# Patient Record
Sex: Female | Born: 1960
Health system: Southern US, Community
[De-identification: ages and names within clinical notes are randomized; demographics above are authoritative.]

## PROBLEM LIST (undated history)

## (undated) DIAGNOSIS — G894 Chronic pain syndrome: Secondary | ICD-10-CM

## (undated) DIAGNOSIS — I10 Essential (primary) hypertension: Secondary | ICD-10-CM

## (undated) DIAGNOSIS — C801 Malignant (primary) neoplasm, unspecified: Secondary | ICD-10-CM

## (undated) DIAGNOSIS — E785 Hyperlipidemia, unspecified: Secondary | ICD-10-CM

## (undated) DIAGNOSIS — Z923 Personal history of irradiation: Secondary | ICD-10-CM

## (undated) DIAGNOSIS — Z6790 Unspecified blood type, Rh positive: Secondary | ICD-10-CM

## (undated) DIAGNOSIS — C3490 Malignant neoplasm of unspecified part of unspecified bronchus or lung: Secondary | ICD-10-CM

## (undated) DIAGNOSIS — Z5111 Encounter for antineoplastic chemotherapy: Secondary | ICD-10-CM

## (undated) DIAGNOSIS — M810 Age-related osteoporosis without current pathological fracture: Secondary | ICD-10-CM

## (undated) DIAGNOSIS — F329 Major depressive disorder, single episode, unspecified: Secondary | ICD-10-CM

## (undated) DIAGNOSIS — C44319 Basal cell carcinoma of skin of other parts of face: Secondary | ICD-10-CM

## (undated) HISTORY — PX: APPENDECTOMY: SHX54

## (undated) HISTORY — PX: SKIN CANCER EXCISION: SHX779

## (undated) HISTORY — DX: Age-related osteoporosis without current pathological fracture: M81.0

## (undated) HISTORY — DX: Chronic pain syndrome: G89.4

## (undated) HISTORY — DX: Hyperlipidemia, unspecified: E78.5

## (undated) HISTORY — DX: Encounter for antineoplastic chemotherapy: Z51.11

## (undated) HISTORY — DX: Essential (primary) hypertension: I10

## (undated) HISTORY — DX: Unspecified blood type, Rh positive: Z67.90

## (undated) HISTORY — DX: Personal history of irradiation: Z92.3

## (undated) HISTORY — PX: EYE SURGERY: SHX253

## (undated) HISTORY — DX: Major depressive disorder, single episode, unspecified: F32.9

---

## 1979-09-03 HISTORY — PX: ABDOMINAL HYSTERECTOMY: SHX81

## 2013-12-01 ENCOUNTER — Other Ambulatory Visit: Payer: Self-pay | Admitting: Family Medicine

## 2013-12-01 DIAGNOSIS — Z1231 Encounter for screening mammogram for malignant neoplasm of breast: Secondary | ICD-10-CM

## 2013-12-09 ENCOUNTER — Other Ambulatory Visit: Payer: Self-pay | Admitting: Family Medicine

## 2013-12-09 ENCOUNTER — Ambulatory Visit
Admission: RE | Admit: 2013-12-09 | Discharge: 2013-12-09 | Disposition: A | Discharge status: Home / Self Care | Payer: Medicare Other | Source: Ambulatory Visit | Attending: Family Medicine | Admitting: Family Medicine

## 2013-12-09 DIAGNOSIS — Z1231 Encounter for screening mammogram for malignant neoplasm of breast: Secondary | ICD-10-CM

## 2013-12-09 DIAGNOSIS — N61 Mastitis without abscess: Secondary | ICD-10-CM

## 2014-01-18 ENCOUNTER — Ambulatory Visit
Admission: RE | Admit: 2014-01-18 | Discharge: 2014-01-18 | Disposition: A | Discharge status: Home / Self Care | Payer: Medicare Other | Source: Ambulatory Visit | Attending: Family Medicine | Admitting: Family Medicine

## 2014-01-18 DIAGNOSIS — N61 Mastitis without abscess: Secondary | ICD-10-CM

## 2016-04-04 ENCOUNTER — Other Ambulatory Visit: Payer: Self-pay | Admitting: Family Medicine

## 2016-04-04 DIAGNOSIS — J181 Lobar pneumonia, unspecified organism: Secondary | ICD-10-CM

## 2016-04-11 ENCOUNTER — Ambulatory Visit
Admission: RE | Admit: 2016-04-11 | Discharge: 2016-04-11 | Disposition: A | Discharge status: Home / Self Care | Payer: Medicare Other | Source: Ambulatory Visit | Attending: Family Medicine | Admitting: Family Medicine

## 2016-04-11 DIAGNOSIS — J181 Lobar pneumonia, unspecified organism: Secondary | ICD-10-CM

## 2016-04-11 MED ORDER — IOPAMIDOL (ISOVUE-300) INJECTION 61%
75.0000 mL | Freq: Once | INTRAVENOUS | Status: AC | PRN
  Administered 2016-04-11: 75 mL via INTRAVENOUS

## 2016-05-10 ENCOUNTER — Encounter: Payer: Self-pay | Admitting: *Deleted

## 2016-05-10 ENCOUNTER — Encounter: Payer: Self-pay | Admitting: Pulmonary Disease

## 2016-05-13 ENCOUNTER — Encounter: Payer: Self-pay | Admitting: Pulmonary Disease

## 2016-05-13 ENCOUNTER — Ambulatory Visit (INDEPENDENT_AMBULATORY_CARE_PROVIDER_SITE_OTHER): Payer: Medicare Other | Admitting: Pulmonary Disease

## 2016-05-13 VITALS — BP 122/80 | HR 95 | Ht 63.0 in | Wt 125.0 lb

## 2016-05-13 DIAGNOSIS — J9859 Other diseases of mediastinum, not elsewhere classified: Secondary | ICD-10-CM | POA: Diagnosis not present

## 2016-05-13 NOTE — Patient Instructions (Addendum)
It is nice to meet you today. We will schedule you for a PET/ CT Scan We will schedule you for PFT's We will refer you to Interventional Radiology for Biopsy of the mass. This will give [place] diagnosis. Once we have diagnosis we can determine plan of care. Follow up with Dr. Vaughan Browner after PET, PFT's and Biopsy are completed.  Please contact office for sooner follow up if symptoms do not improve or worsen or seek emergency care

## 2016-05-13 NOTE — Progress Notes (Signed)
History of Present Illness Debra Kidd is a 55 y.o. female  Current smoker with a 2 pack per day smoking history x 42 years ( 84 pack year smoking history), with new mediastinal mass here for consultation with Dr. Vaughan Browner..    05/13/2016 Consult for Cough and mediastinal mass: Pt. Presents to the office for consultation for mediastinal mass discovered on CXR and then confirmed with CT Chest Iitial symptoms were cough x 6 months, and weakness.. She continiues to smoke 2 pack per day. She does not want resource information on smoking cessation. She states she has a some weight loss of about 5-6 pounds over the last 3 months.She has not had a good appette. She states she has no difficulty swallowing.She does have shortness of breath with exertion.She states she has had some left sided chest discomfort, which has resolved in the last week. She states she has a productive and non-productive cough. Secretions are clear. She denies any hemoptysis. She thinks she has been told she may have COPD, but is not sure.She does give a history of smoking mariajuana daily to help her relax and sleep. She denies fever, orthopnea or hemoptysis.  Tests CT Chest W/ Contrast 04/11/2016  Elongated right upper lobe pulmonary mass measuring 2.7 x 1.2 x 4.6 cm.  Large soft tissue mass in the right anterior mediastinum measuring 6.2 x 4.2 x 9.1 cm. The mediastinal mass abuts, narrows and potentially invades the superior vena cava, and right upper lobe pulmonary arterial branch, and causes mild narrowing of the trachea.  Two other smaller masses with similar enhancing characteristics are seen in the superior right mediastinum and right supraclavicular locations.  These findings may represent primary lung malignancy with extensive malignant lymphadenopathy. Alternatively this may represent a lymphoproliferative disorder with perilymphatic extension to the right upper lobe of the lung.  Two abnormal  retroperitoneal lymph nodes posterior to the pancreas may be associated with the same process.  Subtle hypoattenuated areas within the spleen may also be related to the primary malignancy or represent areas of hypoperfusion.   Past medical hx Past Medical History:  Diagnosis Date  . Blood type, Rh positive   . Chronic pain syndrome   . Hyperlipidemia   . Hypertension   . Osteoporosis      Current Outpatient Prescriptions on File Prior to Visit  Medication Sig  . dicyclomine (BENTYL) 20 MG tablet Take 20 mg by mouth 3 (three) times daily.  . traMADol (ULTRAM) 50 MG tablet Take by mouth every 6 (six) hours as needed.  Marland Kitchen oxyCODONE-acetaminophen (PERCOCET/ROXICET) 5-325 MG tablet Take 0.5 tablets by mouth every 4 (four) hours as needed for severe pain.   No current facility-administered medications on file prior to visit.      Allergies  Allergen Reactions  . Codeine Nausea And Vomiting  . Motrin [Ibuprofen]     Elevation of BP  . [name]     intolerant    Review Of Systems:  Constitutional:   + weight loss, night sweats,  Fevers, chills, fatigue, or  lassitude.  HEENT:   No headaches,  Difficulty swallowing,  Tooth/dental problems, or  + Sore throat,                No sneezing, itching, ear ache, nasal congestion, post nasal drip,   CV:  No chest pain,  Orthopnea, PND, swelling in lower extremities, anasarca, dizziness, palpitations, syncope.   GI  No heartburn, indigestion, abdominal pain, nausea, vomiting, diarrhea, change in bowel habits, +  loss of appetite, bloody stools.   Resp: + shortness of breath with exertion not  at rest.  + excess mucus, + productive cough,  + non-productive cough,  No coughing up of blood.  No change in color of mucus.  + wheezing with exertion.  No chest wall deformity  Skin: no rash or lesions.  GU: no dysuria, change in color of urine, no urgency or frequency.  No flank pain, no hematuria   MS:  No joint pain or  swelling.  No decreased range of motion.  No back pain.  Psych:  No change in mood or affect. No depression or anxiety.  No memory loss.  Family History  Problem Relation Age of Onset  . Heart disease Mother   . Heart disease Father   . Emphysema Brother   . Breast cancer Maternal Grandmother   . Heart disease Brother     Social History   Social History  . Marital status: Married    Spouse name: N/A  . Number of children: N/A  . Years of education: N/A   Social History Main Topics  . Smoking status: Current Every Day Smoker    Packs/day: 2.00    Years: 42.00  . Smokeless tobacco: Never Used  . Alcohol use No     Comment: quit drinking beer 02/2016  . Drug use:     Frequency: 7.0 times per week    Types: Marijuana  . Sexual activity: Not Asked   Other Topics Concern  . 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 ABDOMINAL HYSTERECTOMY  1981    Vital Signs BP 122/80 (BP Location: Left Arm, Cuff Size: Normal)   Pulse 95   Ht '5\' 3"'$  (1.6 m)   Wt 125 lb (56.7 kg)   SpO2 96%   BMI 22.14 kg/m    Physical Exam:  General- No distress,  A&Ox3, pleasant, thin female wearing dark tinged sunglasses ENT: No sinus tenderness, TM clear, pale nasal mucosa, no oral exudate,no post nasal drip, no LAN Cardiac: S1, S2, regular rate and rhythm, no murmur Chest: No wheeze/ rales/ dullness; no accessory muscle use, no nasal flaring, no sternal retractions Abd.: Soft Non-tender Ext: No clubbing cyanosis, edema Neuro:  normal strength Skin: No rashes, warm and dry Psych: normal mood and behavior   Assessment/Plan  Mediastinal mass Mediastinal Mass: Plan It is nice to meet you today. We will schedule you for a PET/ CT Scan We will schedule you for PFT's ( Pulmonary Function Tests) We will refer  you to Interventional Radiology for Biopsy of the mass found on CT scan.. This will give [place] diagnosis. Follow up with Dr. Vaughan Browner after PET CT and PFT's, and biopsy have been completed... Please contact office for sooner follow up if symptoms do not improve or worsen or seek emergency care      Magdalen Spatz, NP 05/13/2016  3:58 PM   Attending note: I have seen and examined the patient with nurse practitioner/resident and agree with the note. History, labs and imaging reviewed.  55 Y/O active smoker. Referred for evaluation of abnormal CT scan  Images personally reviewed > shows right upper lobe lung mass, rt anterior mediastinal masses, rt supraclavicular adenopathy wit involvement of spleen and abdominal LNs.  Findings are concerning for  a bronchogenic malignancy. The supraclavicular mass appears to be the most accessible. We will schedule for IR biopsy of this lesion. If non diagnostic then we will have to consider bronchoscopy. We will also schedule for PET scan and PFTs.  Marshell Garfinkel MD [facility] Pager [phone] If no answer or after 3pm call: [phone] 05/16/2016, 1:49 PM

## 2016-05-13 NOTE — Assessment & Plan Note (Signed)
Mediastinal Mass: Plan It is nice to meet you today. We will schedule you for a PET/ CT Scan We will schedule you for PFT's ( Pulmonary Function Tests) We will refer you to Interventional Radiology for Biopsy of the mass found on CT scan.. This will give [place] diagnosis. Follow up with Dr. Vaughan Browner after PET CT and PFT's, and biopsy have been completed... Please contact office for sooner follow up if symptoms do not improve or worsen or seek emergency care

## 2016-05-16 ENCOUNTER — Encounter: Payer: Self-pay | Admitting: Pulmonary Disease

## 2016-05-17 ENCOUNTER — Other Ambulatory Visit: Payer: Self-pay | Admitting: Radiology

## 2016-05-20 ENCOUNTER — Ambulatory Visit (HOSPITAL_COMMUNITY): Admission: RE | Admit: 2016-05-20 | Payer: Medicare Other | Source: Ambulatory Visit

## 2016-05-21 ENCOUNTER — Encounter (HOSPITAL_COMMUNITY): Payer: Self-pay | Admitting: *Deleted

## 2016-05-21 ENCOUNTER — Ambulatory Visit (HOSPITAL_COMMUNITY)
Admission: RE | Admit: 2016-05-21 | Discharge: 2016-05-21 | Disposition: A | Discharge status: Home / Self Care | Payer: Medicare Other | Source: Ambulatory Visit | Attending: Pulmonary Disease | Admitting: Pulmonary Disease

## 2016-05-21 DIAGNOSIS — J9859 Other diseases of mediastinum, not elsewhere classified: Secondary | ICD-10-CM

## 2016-05-21 DIAGNOSIS — Z79899 Other long term (current) drug therapy: Secondary | ICD-10-CM | POA: Insufficient documentation

## 2016-05-21 DIAGNOSIS — Z720 Tobacco use: Secondary | ICD-10-CM | POA: Insufficient documentation

## 2016-05-21 DIAGNOSIS — C3491 Malignant neoplasm of unspecified part of right bronchus or lung: Secondary | ICD-10-CM | POA: Insufficient documentation

## 2016-05-21 HISTORY — DX: Malignant (primary) neoplasm, unspecified: C80.1

## 2016-05-21 HISTORY — DX: Basal cell carcinoma of skin of other parts of face: C44.319

## 2016-05-21 LAB — CBC WITH DIFFERENTIAL/PLATELET
BASOS ABS: 0 10*3/uL (ref 0.0–0.1)
BASOS PCT: 0 %
EOS PCT: 1 %
Eosinophils Absolute: 0.1 10*3/uL (ref 0.0–0.7)
HEMATOCRIT: 45.4 % (ref 36.0–46.0)
Hemoglobin: 15.5 g/dL — ABNORMAL HIGH (ref 12.0–15.0)
LYMPHS PCT: 18 %
Lymphs Abs: 1.8 10*3/uL (ref 0.7–4.0)
MCH: 35.6 pg — ABNORMAL HIGH (ref 26.0–34.0)
MCHC: 34.1 g/dL (ref 30.0–36.0)
MCV: 104.4 fL — AB (ref 78.0–100.0)
MONO ABS: 0.6 10*3/uL (ref 0.1–1.0)
MONOS PCT: 6 %
NEUTROS ABS: 7.4 10*3/uL (ref 1.7–7.7)
Neutrophils Relative %: 75 %
PLATELETS: 485 10*3/uL — AB (ref 150–400)
RBC: 4.35 MIL/uL (ref 3.87–5.11)
RDW: 13.6 % (ref 11.5–15.5)
WBC: 9.9 10*3/uL (ref 4.0–10.5)

## 2016-05-21 LAB — BASIC METABOLIC PANEL
ANION GAP: 13 (ref 5–15)
BUN: 7 mg/dL (ref 6–20)
CALCIUM: 9.3 mg/dL (ref 8.9–10.3)
CO2: 21 mmol/L — AB (ref 22–32)
Chloride: 101 mmol/L (ref 101–111)
Creatinine, Ser: 0.76 mg/dL (ref 0.44–1.00)
GFR calc Af Amer: >60 mL/min (ref >60)
GLUCOSE: 117 mg/dL — AB (ref 65–99)
Potassium: 3.1 mmol/L — ABNORMAL LOW (ref 3.5–5.1)
Sodium: 135 mmol/L (ref 135–145)

## 2016-05-21 LAB — PROTIME-INR
INR: 1
Prothrombin Time: 13.2 seconds (ref 11.4–15.2)

## 2016-05-21 MED ORDER — MIDAZOLAM HCL 2 MG/2ML IJ SOLN
INTRAMUSCULAR | Status: AC
  Filled 2016-05-21: qty 4

## 2016-05-21 MED ORDER — FENTANYL CITRATE (PF) 100 MCG/2ML IJ SOLN
INTRAMUSCULAR | Status: AC | PRN
  Administered 2016-05-21: 50 ug via INTRAVENOUS
  Administered 2016-05-21: 25 ug via INTRAVENOUS

## 2016-05-21 MED ORDER — SODIUM CHLORIDE 0.9 % IV SOLN
INTRAVENOUS | Status: DC
  Administered 2016-05-21: 11:00:00 via INTRAVENOUS

## 2016-05-21 MED ORDER — MIDAZOLAM HCL 2 MG/2ML IJ SOLN
INTRAMUSCULAR | Status: AC | PRN
  Administered 2016-05-21 (×2): 1 mg via INTRAVENOUS

## 2016-05-21 MED ORDER — HYDROCODONE-ACETAMINOPHEN 5-325 MG PO TABS: 1.0000 | ORAL_TABLET | ORAL | Status: DC | PRN

## 2016-05-21 MED ORDER — FENTANYL CITRATE (PF) 100 MCG/2ML IJ SOLN
INTRAMUSCULAR | Status: AC
  Filled 2016-05-21: qty 2

## 2016-05-21 NOTE — Consult Note (Signed)
Chief Complaint: Patient was seen in consultation today for US guided right supraclavicular lymph node biopsy  Referring Physician(s): [place]  Supervising Physician: Arne Cleveland  Patient Status: Outpatient  History of Present Illness: Debra Kidd is a 55 y.o. female smoker with recent history of right chest discomfort, cough and imaging studies now revealing elongated right upper lobe lung mass, right anterior mediastinal soft tissue mass, right supraclavicular adenopathy, retroperitoneal lymphadenopathy, as well as subtle hypoattenuated areas within the spleen. She presents today for ultrasound-guided right supraclavicular lymph node biopsy for further evaluation.  Past Medical History:  Diagnosis Date  . 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 Blood type, Rh positive   . Cancer ([place])   . Chronic pain syndrome   . Hyperlipidemia   . Hypertension   . Osteoporosis     Past Surgical History:  Procedure Laterality Date  . ABDOMINAL HYSTERECTOMY  1981  . EYE SURGERY     left  . SKIN CANCER EXCISION     basal cell carcinoma L side of face    Allergies: Codeine; Motrin [ibuprofen]; and [name]  Medications: Prior to Admission medications   Medication Sig Start Date End Date Taking? Authorizing Provider  LORazepam (ATIVAN) 1 MG tablet Take 1 mg by mouth 2 (two) times daily.   Yes Historical Provider, MD  losartan (COZAAR) 50 MG tablet Take 50 mg by mouth daily.   Yes Historical Provider, MD  traMADol (ULTRAM) 50 MG tablet Take by mouth every 6 (six) hours as needed.   Yes Historical Provider, MD  dicyclomine (BENTYL) 20 MG tablet Take 20 mg by mouth 3 (three) times daily.    Historical Provider, MD  oxyCODONE-acetaminophen (PERCOCET/ROXICET) 5-325 MG tablet Take 0.5 tablets by mouth every 4 (four) hours as needed for severe pain.    Historical Provider, MD     Family History  Problem Relation Age of Onset  . Heart disease Mother    . Heart disease Father   . Emphysema Brother   . Breast cancer Maternal Grandmother   . Heart disease Brother     Social History   Social History  . Marital status: Married    Spouse name: N/A  . Number of children: N/A  . Years of education: N/A   Social History Main Topics  . Smoking status: Current Every Day Smoker    Packs/day: 2.00    Years: 42.00  . Smokeless tobacco: Never Used  . Alcohol use No     Comment: quit drinking beer 02/2016  . Drug use:     Frequency: 7.0 times per week    Types: Marijuana  . Sexual activity: Not Asked   Other Topics Concern  . 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 She has had some mild weight loss, right chest discomfort, occasional dyspnea, cough.  Vital Signs: BP (!) 157/99 (BP Location: Right Arm)   Pulse (!) 108   Temp 98.8 F (37.1 C) (Oral)   Resp 16   SpO2 98%   Physical Exam awake, alert. Chest with few fine bibasilar crackles. Heart with  slightly tachycardic but regular rhythm. Abdomen soft, positive bowel sounds, nontender. Lower extremities with no edema.  Mallampati Score:     Imaging: No results found.  Labs:  CBC: No results for input(s): WBC, HGB, HCT, PLT in the last 8760 hours.  COAGS: No results for input(s): INR, APTT in the last 8760 hours.  BMP: No results for input(s): NA, K, CL, CO2, GLUCOSE, BUN, CALCIUM, CREATININE, GFRNONAA, GFRAA in the last 8760 hours.  Invalid input(s): CMP  LIVER FUNCTION TESTS: No results for input(s): BILITOT, AST, ALT, ALKPHOS, PROT, ALBUMIN in the last 8760 hours.  TUMOR MARKERS: No results for input(s): AFPTM, CEA, CA199, CHROMGRNA in the last 8760 hours.  Assessment and Plan: 55 y.o. female smoker with recent  history of right chest discomfort, cough and imaging studies now revealing elongated right upper lobe lung mass, right anterior mediastinal soft tissue mass, right supraclavicular adenopathy, retroperitoneal lymphadenopathy, as well as subtle hypoattenuated areas within the spleen. She presents today for ultrasound-guided right supraclavicular lymph node biopsy for further evaluation.Risks and benefits discussed with the patient/husbandincluding, but not limited to bleeding, infection, damage to adjacent structures or low yield requiring additional tests. All of the patient's questions were answered, patient is agreeable to proceed. Consent signed and in chart.      Thank you for this interesting consult.  I greatly enjoyed meeting Ertha Nabor and look forward to participating in their care.  A copy of this report was sent to the requesting provider on this date.  Electronically Signed: D. Rowe Robert 05/21/2016, 12:02 PM   I spent a total of 25 minutes  in face to face in clinical consultation, greater than 50% of which was counseling/coordinating care for US guided right supraclavicular lymph node biopsy

## 2016-05-21 NOTE — CV Procedure (Signed)
[place] core biopsy R supraclavicular LAN No complication No blood loss. See complete dictation in [facility].

## 2016-05-21 NOTE — Discharge Instructions (Signed)
Incision Care  An incision (cut) is when a surgeon cuts into your body. After surgery, the cut needs to be well cared for to keep it from getting infected.  HOW TO CARE FOR YOUR CUT  Take medicines only as told by your doctor.  There are many different ways to close and cover a cut, including stitches, skin glue, and adhesive strips. Follow your doctor's instructions on:  Care of the cut.  Bandage (dressing) changes and removal.  Cut closure removal.  Do not take baths, swim, or use a hot tub until your doctor says it is okay. You may shower as told by your doctor.  Return to your normal diet and activities as allowed by your doctor.  Use medicine that helps lessen itching on your cut as told by your doctor. Do not pick or scratch at your cut.  Drink enough fluids to keep your pee (urine) clear or pale yellow. GET HELP IF:  You have redness, puffiness (swelling), or pain at the site of your cut.  You have fluid, blood, or pus coming from your cut.  Your muscles ache.  You have chills or you feel sick.  You have a bad smell coming from the cut or bandage.  Your cut opens up after stitches, staples, or adhesive strips have been removed.  You keep feeling sick to your stomach (nauseous) or keep throwing up (vomiting).  You have a fever.  You are dizzy. GET HELP RIGHT AWAY IF:  You have a rash.  You pass out (faint).  You have trouble breathing. MAKE SURE YOU:   Understand these instructions.  Will watch your condition.  Will get help right away if you are not doing well or get worse.   This information is not intended to replace advice given to you by your health care provider. Make sure you discuss any questions you have with your health care provider.   Document Released: 11/11/2011 Document Revised: 09/09/2014 Document Reviewed: 10/13/2013 Elsevier Interactive Patient Education 2016 Elsevier Inc. Moderate Conscious Sedation, Adult, Care After Refer to this  sheet in the next few weeks. These instructions provide you with information on caring for yourself after your procedure. Your health care provider may also give you more specific instructions. Your treatment has been planned according to current medical practices, but problems sometimes occur. Call your health care provider if you have any problems or questions after your procedure. WHAT TO EXPECT AFTER THE PROCEDURE  After your procedure:  You may feel sleepy, clumsy, and have poor balance for several hours.  Vomiting may occur if you eat too soon after the procedure. HOME CARE INSTRUCTIONS  Do not participate in any activities where you could become injured for at least 24 hours. Do not:  Drive.  Swim.  Ride a bicycle.  Operate heavy machinery.  Cook.  Use power tools.  Climb ladders.  Work from a high place.  Do not make important decisions or sign legal documents until you are improved.  If you vomit, drink water, juice, or soup when you can drink without vomiting. Make sure you have little or no nausea before eating solid foods.  Only take over-the-counter or prescription medicines for pain, discomfort, or fever as directed by your health care provider.  Make sure you and your family fully understand everything about the medicines given to you, including what side effects may occur.  You should not drink alcohol, take sleeping pills, or take medicines that cause drowsiness for at least 24  hours.  If you smoke, do not smoke without supervision.  If you are feeling better, you may resume normal activities 24 hours after you were sedated.  Keep all appointments with your health care provider. SEEK MEDICAL CARE IF:  Your skin is pale or bluish in color.  You continue to feel nauseous or vomit.  Your pain is getting worse and is not helped by medicine.  You have bleeding or swelling.  You are still sleepy or feeling clumsy after 24 hours. SEEK IMMEDIATE MEDICAL  CARE IF:  You develop a rash.  You have difficulty breathing.  You develop any type of allergic problem.  You have a fever. MAKE SURE YOU:  Understand these instructions.  Will watch your condition.  Will get help right away if you are not doing well or get worse.   This information is not intended to replace advice given to you by your health care provider. Make sure you discuss any questions you have with your health care provider.   Document Released: 06/09/2013 Document Revised: 09/09/2014 Document Reviewed: 06/09/2013 Elsevier Interactive Patient Education [employer].

## 2016-05-22 LAB — ACID FAST SMEAR (AFB): ACID FAST SMEAR - AFSCU2: NEGATIVE

## 2016-05-22 LAB — ACID FAST SMEAR (AFB, MYCOBACTERIA)

## 2016-05-23 ENCOUNTER — Telehealth: Payer: Self-pay | Admitting: Pulmonary Disease

## 2016-05-23 DIAGNOSIS — C801 Malignant (primary) neoplasm, unspecified: Secondary | ICD-10-CM

## 2016-05-23 NOTE — Telephone Encounter (Signed)
Correct # is [phone]. Called # and was placed on hold x 8 min. WCB

## 2016-05-23 NOTE — Telephone Encounter (Signed)
Per PM: refer to Oncology ASAP for new diagnosis of small cell carcinoma.  Thanks.  Order placed Nothing further needed; will sign off

## 2016-05-23 NOTE — Telephone Encounter (Signed)
I spoke on the phone with pathology. 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 I spoke and informed the patient about the diagnosis. Referral made to Oncology.   Marshell Garfinkel MD [facility] Pager [phone] 05/23/2016, 1:20 PM

## 2016-05-24 ENCOUNTER — Telehealth: Payer: Self-pay | Admitting: *Deleted

## 2016-05-24 ENCOUNTER — Encounter: Payer: Self-pay | Admitting: *Deleted

## 2016-05-24 ENCOUNTER — Other Ambulatory Visit: Payer: Medicare Other

## 2016-05-24 DIAGNOSIS — J9859 Other diseases of mediastinum, not elsewhere classified: Secondary | ICD-10-CM

## 2016-05-24 NOTE — Telephone Encounter (Signed)
Mailed clinic letter to pt.  

## 2016-05-24 NOTE — Telephone Encounter (Signed)
Oncology Nurse Navigator Documentation  Oncology Nurse Navigator Flowsheets 05/24/2016  Navigator Encounter Type Introductory phone call;Telephone/I received referral on Debra Kidd.  I called but was unable to reach or leave a vm message.   Telephone Outgoing Call  Treatment Phase Pre-Tx/Tx Discussion  Barriers/Navigation Needs Coordination of Care  Interventions Coordination of Care  Coordination of Care Appts  Acuity Level 1  Time Spent with Patient 15

## 2016-05-24 NOTE — Telephone Encounter (Signed)
Oncology Nurse Navigator Documentation  Oncology Nurse Navigator Flowsheets 05/24/2016  Navigator Encounter Type Telephone/I made another call to schedule [name] for [employer].  I was able to reach her and gave her the appt for 05/30/16 arrive at 1:30.  She verbalized understanding of appt time and place.   Telephone Outgoing Call  Treatment Phase Pre-Tx/Tx Discussion  Barriers/Navigation Needs Coordination of Care  Interventions Coordination of Care  Coordination of Care Appts  Acuity Level 1  Acuity Level 1 Initial guidance, education and coordination as needed  Time Spent with Patient 15

## 2016-05-26 LAB — AEROBIC/ANAEROBIC CULTURE (SURGICAL/DEEP WOUND): CULTURE: NO GROWTH

## 2016-05-26 LAB — AEROBIC/ANAEROBIC CULTURE W GRAM STAIN (SURGICAL/DEEP WOUND)
Gram Stain: NONE SEEN
Special Requests: NORMAL

## 2016-05-30 ENCOUNTER — Encounter: Payer: Self-pay | Admitting: *Deleted

## 2016-05-30 ENCOUNTER — Ambulatory Visit: Payer: Medicare Other | Attending: Internal Medicine | Admitting: Physical Therapy

## 2016-05-30 ENCOUNTER — Other Ambulatory Visit (HOSPITAL_BASED_OUTPATIENT_CLINIC_OR_DEPARTMENT_OTHER): Payer: Medicare Other

## 2016-05-30 ENCOUNTER — Ambulatory Visit: Payer: Medicare Other

## 2016-05-30 ENCOUNTER — Ambulatory Visit (HOSPITAL_BASED_OUTPATIENT_CLINIC_OR_DEPARTMENT_OTHER): Payer: Medicare Other | Admitting: Internal Medicine

## 2016-05-30 ENCOUNTER — Ambulatory Visit
Admission: RE | Admit: 2016-05-30 | Discharge: 2016-05-30 | Disposition: A | Discharge status: Home / Self Care | Payer: Medicare Other | Source: Ambulatory Visit | Attending: Radiation Oncology | Admitting: Radiation Oncology

## 2016-05-30 ENCOUNTER — Encounter: Payer: Self-pay | Admitting: Internal Medicine

## 2016-05-30 VITALS — BP 120/90 | HR 102 | Temp 99.2°F | Resp 17 | Ht 63.0 in | Wt 122.8 lb

## 2016-05-30 DIAGNOSIS — M810 Age-related osteoporosis without current pathological fracture: Secondary | ICD-10-CM

## 2016-05-30 DIAGNOSIS — E876 Hypokalemia: Secondary | ICD-10-CM

## 2016-05-30 DIAGNOSIS — C3411 Malignant neoplasm of upper lobe, right bronchus or lung: Secondary | ICD-10-CM | POA: Diagnosis not present

## 2016-05-30 DIAGNOSIS — R2681 Unsteadiness on feet: Secondary | ICD-10-CM

## 2016-05-30 DIAGNOSIS — R599 Enlarged lymph nodes, unspecified: Secondary | ICD-10-CM

## 2016-05-30 DIAGNOSIS — J9859 Other diseases of mediastinum, not elsewhere classified: Secondary | ICD-10-CM

## 2016-05-30 DIAGNOSIS — M899 Disorder of bone, unspecified: Secondary | ICD-10-CM | POA: Diagnosis not present

## 2016-05-30 DIAGNOSIS — C3491 Malignant neoplasm of unspecified part of right bronchus or lung: Secondary | ICD-10-CM | POA: Insufficient documentation

## 2016-05-30 DIAGNOSIS — K869 Disease of pancreas, unspecified: Secondary | ICD-10-CM

## 2016-05-30 DIAGNOSIS — Z23 Encounter for immunization: Secondary | ICD-10-CM | POA: Diagnosis not present

## 2016-05-30 DIAGNOSIS — Z803 Family history of malignant neoplasm of breast: Secondary | ICD-10-CM | POA: Diagnosis not present

## 2016-05-30 DIAGNOSIS — R29898 Other symptoms and signs involving the musculoskeletal system: Secondary | ICD-10-CM

## 2016-05-30 DIAGNOSIS — Z8249 Family history of ischemic heart disease and other diseases of the circulatory system: Secondary | ICD-10-CM

## 2016-05-30 DIAGNOSIS — Z720 Tobacco use: Secondary | ICD-10-CM | POA: Diagnosis not present

## 2016-05-30 LAB — COMPREHENSIVE METABOLIC PANEL
ALBUMIN: 3 g/dL — AB (ref 3.5–5.0)
ALT: 19 U/L (ref 0–55)
AST: 15 U/L (ref 5–34)
Alkaline Phosphatase: 132 U/L (ref 40–150)
Anion Gap: 12 mEq/L — ABNORMAL HIGH (ref 3–11)
BUN: 4.3 mg/dL — AB (ref 7.0–26.0)
CHLORIDE: 104 meq/L (ref 98–109)
CO2: 22 mEq/L (ref 22–29)
CREATININE: 0.7 mg/dL (ref 0.6–1.1)
Calcium: 9.1 mg/dL (ref 8.4–10.4)
EGFR: >90 mL/min/{1.73_m2} (ref >90)
GLUCOSE: 112 mg/dL (ref 70–140)
POTASSIUM: 2.9 meq/L — AB (ref 3.5–5.1)
SODIUM: 138 meq/L (ref 136–145)
Total Bilirubin: 0.33 mg/dL (ref 0.20–1.20)
Total Protein: 6.8 g/dL (ref 6.4–8.3)

## 2016-05-30 LAB — CBC WITH DIFFERENTIAL/PLATELET
BASO%: 0.2 % (ref 0.0–2.0)
BASOS ABS: 0 10*3/uL (ref 0.0–0.1)
EOS%: 1 % (ref 0.0–7.0)
Eosinophils Absolute: 0.1 10*3/uL (ref 0.0–0.5)
HEMATOCRIT: 39.6 % (ref 34.8–46.6)
HEMOGLOBIN: 13.6 g/dL (ref 11.6–15.9)
LYMPH#: 1.6 10*3/uL (ref 0.9–3.3)
LYMPH%: 17.7 % (ref 14.0–49.7)
MCH: 35 pg — AB (ref 25.1–34.0)
MCHC: 34.3 g/dL (ref 31.5–36.0)
MCV: 101.8 fL — ABNORMAL HIGH (ref 79.5–101.0)
MONO#: 0.7 10*3/uL (ref 0.1–0.9)
MONO%: 7.2 % (ref 0.0–14.0)
NEUT#: 6.9 10*3/uL — ABNORMAL HIGH (ref 1.5–6.5)
NEUT%: 73.9 % (ref 38.4–76.8)
Platelets: 402 10*3/uL — ABNORMAL HIGH (ref 145–400)
RBC: 3.89 10*6/uL (ref 3.70–5.45)
RDW: 13.4 % (ref 11.2–14.5)
WBC: 9.3 10*3/uL (ref 3.9–10.3)

## 2016-05-30 MED ORDER — LIDOCAINE-PRILOCAINE 2.5-2.5 % EX CREA: 1.0000 "application " | TOPICAL_CREAM | CUTANEOUS | 0 refills | Status: DC | PRN

## 2016-05-30 MED ORDER — PROCHLORPERAZINE MALEATE 10 MG PO TABS: 10.0000 mg | ORAL_TABLET | Freq: Four times a day (QID) | ORAL | 0 refills | Status: DC | PRN

## 2016-05-30 MED ORDER — POTASSIUM CHLORIDE ER 20 MEQ PO TBCR: 40.0000 meq | EXTENDED_RELEASE_TABLET | Freq: Every day | ORAL | 0 refills | Status: DC

## 2016-05-30 MED ORDER — INFLUENZA VAC SPLIT QUAD 0.5 ML IM SUSY
0.5000 mL | PREFILLED_SYRINGE | Freq: Once | INTRAMUSCULAR | Status: AC
  Administered 2016-05-30: 0.5 mL via INTRAMUSCULAR
  Filled 2016-05-30: qty 0.5

## 2016-05-30 NOTE — Patient Instructions (Signed)

## 2016-05-30 NOTE — Progress Notes (Signed)
[facility] [phone]   [phone] Multidisciplinary thoracic oncology clinic   CONSULT NOTE  REFERRING PHYSICIAN: Dr. Marshell Garfinkel  REASON FOR CONSULTATION:  55 years old white female recently diagnosed with lung cancer.  HPI Debra Kidd is a 55 y.o. female was past medical history significant for hypertension, dyslipidemia, osteoporosis, chronic pain syndrome as well as basal cell carcinoma of the nose. In late July 2017 The patient has been complaining of discomfort on the right side of the chest as well as cough and questionable seasonal allergy. She was seen by her primary care physician and chest x-ray on 03/31/2016 showed right upper lobe opacity with abnormal can 2 of the right superior mediastinum. This was followed by CT scan of the chest on 04/11/2016 and it showed a lobulated soft tissue mass within the superior mediastinum centered in the right pretracheal location measuring 6.2 x 4.2 x 9.1 cm. It abuts the right superior pericardium and causes significant not willing of the right upper lobe pulmonary artery which crosses through the inferior portion of this mass. The mass abuts, now was and potentially invade the superior vena cava. There was also mild narrowing of the trachea. Two other similar in enhancement characteristics soft tissue masses are seen in the right supraclavicular location measuring 3.1 by 2.3 by 2.5 cm and 1.8 x 1.2 x 1.3 cm. Subcarinal lymph node with nonspecific appearance is seen. There is an irregular soft tissue mass in the right upper lobe which measures 2.7 by 1.2 by 4.6 cm and bridges the apical pleura with the medial mediastinum. Additionally, there is a soft tissue mass within the distal body of the pancreas which measures 3.1 x 2.4 x 2.7 cm. There is an associated minimal dilation of the main pancreatic duct in the tail of the pancreas. Given the findings of lymphadenopathy in the mediastinum, retroperitoneum and  right supraclavicular regions, and probable splenic metastases, the pancreatic mass may represent the primary malignancy. The patient was referred to Dr. Vaughan Browner. He ordered ultrasound guided core biopsy of the right supraclavicular lymph node which was performed by interventional radiology on 05/21/2016 and the final pathology (Accession: [id number]) was positive for small cell carcinoma. The tumor is positive for cytokeratin AE1/AE3, TTF-1, synaptophysin, CD56 and chromogranin. The tumor is negative for CD45, CDX-2 and cytokeratin 5/6. The morphology coupled with the staining pattern is consistent with small cell carcinoma and the immunohistochemical staining pattern is consistent with a lung primary source. Dr. Vaughan Browner kindly referred the patient to me today for evaluation and recommendation regarding treatment of her condition. When seen today the patient is complaining of swelling of the right neck as well as shortness of breath with cough productive of clear sputum. She also has mild vomiting after excessive cough. She lost around 10 pounds in the last 2 months. She denied having any headache or visual changes. She has no nausea, diarrhea or constipation. Family history significant for father, mother and brother died from heart disease. Maternal grandmother had breast cancer. The patient is married and has 2 children. She was accompanied today by her husband, Debra Kidd. She is currently retired and worked in several jobs including cooking. She has a history of smoking 2 packs per day for around 43 years and unfortunately she continues to smoke. She has no history of alcohol abuse and she smokes marijuana.  HPI  Past Medical History:  Diagnosis Date  . 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  Blood type, Rh positive   . Cancer ([place])   . Chronic pain syndrome   . Hyperlipidemia   . Hypertension   . Osteoporosis     Past Surgical History:  Procedure Laterality Date  . ABDOMINAL  HYSTERECTOMY  1981  . EYE SURGERY     left  . SKIN CANCER EXCISION     basal cell carcinoma L side of face    Family History  Problem Relation Age of Onset  . Heart disease Mother   . Heart disease Father   . Emphysema Brother   . Breast cancer Maternal Grandmother   . Heart disease Brother     Social History Social History  Substance Use Topics  . Smoking status: Current Every Day Smoker    Packs/day: 2.00    Years: 42.00  . Smokeless tobacco: Never Used  . Alcohol use No     Comment: quit drinking beer 02/2016    Allergies  Allergen Reactions  . Codeine Nausea And Vomiting  . Motrin [Ibuprofen]     Elevation of BP  . [name]     intolerant    Current Outpatient Prescriptions  Medication Sig Dispense Refill  . aspirin 325 MG EC tablet Take 325 mg by mouth 2 (two) times daily as needed for pain.    . calcium carbonate (TUMS - DOSED IN MG ELEMENTAL CALCIUM) 500 MG chewable tablet Chew 1 tablet by mouth once a week.    Marland Kitchen LORazepam (ATIVAN) 1 MG tablet Take 1 mg by mouth 2 (two) times daily.    Marland Kitchen losartan (COZAAR) 50 MG tablet Take 50 mg by mouth daily.    . traMADol (ULTRAM) 50 MG tablet Take by mouth every 6 (six) hours as needed.    . lidocaine-prilocaine (EMLA) cream Apply 1 application topically as needed. 30 g 0  . oxyCODONE-acetaminophen (PERCOCET/ROXICET) 5-325 MG tablet Take 0.5 tablets by mouth every 4 (four) hours as needed for severe pain.    . potassium chloride 20 MEQ TBCR Take 40 mEq by mouth daily. 2 tablets 20 mg each every day for 10 days. 20 tablet 0  . prochlorperazine (COMPAZINE) 10 MG tablet Take 1 tablet (10 mg total) by mouth every 6 (six) hours as needed for nausea or vomiting. 30 tablet 0   No current facility-administered medications for this visit.     Review of Systems  Constitutional: positive for anorexia, fatigue and weight loss Eyes: negative Ears, nose, mouth, throat, and face: negative Respiratory: positive for  cough, dyspnea on exertion and sputum Cardiovascular: negative Gastrointestinal: negative Genitourinary:negative Integument/breast: negative Hematologic/lymphatic: negative Musculoskeletal:negative Neurological: negative Behavioral/Psych: negative Endocrine: negative Allergic/Immunologic: negative  Physical Exam  UKG:URKYH, healthy, no distress, well nourished, well developed and anxious SKIN: skin color, texture, turgor are normal, no rashes or significant lesions HEAD: Normocephalic, No masses, lesions, tenderness or abnormalities EYES: normal, PERRLA, Conjunctiva are pink and non-injected EARS: External ears normal, Canals clear OROPHARYNX:no exudate, no erythema and lips, buccal mucosa, and tongue normal  NECK: supple, no adenopathy, no JVD LYMPH:  no palpable lymphadenopathy, no hepatosplenomegaly BREAST:not examined LUNGS: clear to auscultation , and palpation HEART: regular rate & rhythm, no murmurs and no gallops ABDOMEN:abdomen soft, non-tender, normal bowel sounds and no masses or organomegaly BACK: Back symmetric, no curvature., No CVA tenderness EXTREMITIES:no joint deformities, effusion, or inflammation, no edema, no skin discoloration  NEURO: alert & oriented x 3 with fluent speech, no focal motor/sensory deficits  PERFORMANCE STATUS: ECOG 1  LABORATORY DATA: Lab  Results  Component Value Date   WBC 9.3 05/30/2016   HGB 13.6 05/30/2016   HCT 39.6 05/30/2016   MCV 101.8 (H) 05/30/2016   PLT 402 (H) 05/30/2016      Chemistry      Component Value Date/Time   NA 138 05/30/2016 1258   K 2.9 (LL) 05/30/2016 1258   CL 101 05/21/2016 1100   CO2 22 05/30/2016 1258   BUN 4.3 (L) 05/30/2016 1258   CREATININE 0.7 05/30/2016 1258      Component Value Date/Time   CALCIUM 9.1 05/30/2016 1258   ALKPHOS 132 05/30/2016 1258   AST 15 05/30/2016 1258   ALT 19 05/30/2016 1258   BILITOT 0.33 05/30/2016 1258       RADIOGRAPHIC STUDIES: US Biopsy  Result Date:  05/21/2016 CLINICAL DATA:  Mediastinal mass, right upper lobe mass, right supraclavicular adenopathy. EXAM: ULTRASOUND GUIDED CORE BIOPSY OF RIGHT SUPRACLAVICULAR ADENOPATHY MEDICATIONS: Intravenous Fentanyl and Versed were administered as conscious sedation during continuous monitoring of the patient's level of consciousness and physiological / cardiorespiratory status by the radiology RN, with a total moderate sedation time of 10 minutes. PROCEDURE: The procedure, risks, benefits, and alternatives were explained to the patient. Questions regarding the procedure were encouraged and answered. The patient understands and consents to the procedure. The right supraclavicular adenopathy was localized and an appropriate skin 2 showed determined and marked. The operative field was prepped with chlorhexidine in a sterile fashion, and a sterile drape was applied covering the operative field. A sterile gown and sterile gloves were used for the procedure. Local anesthesia was provided with 1% Lidocaine. Under real-time ultrasound guidance, a 17 gauge trocar needle was advanced to the margin of the lesion. Once needle tip position was confirmed, coaxial 18-gauge core biopsy samples were obtained, submitted in formalin and saline to surgical pathology and microbiology respectively. The guide needle was removed. Postprocedure scans show no hematoma or other apparent complication. The patient tolerated the procedure well. COMPLICATIONS: None. FINDINGS: Survey right supraclavicular ultrasound confirms hypoechoic supraclavicular adenopathy measuring up to 3.5 cm in diameter. Core biopsy samples were obtained under ultrasound guidance as above. IMPRESSION: 1. Technically successful ultrasound-guided core biopsy of right supraclavicular adenopathy. Electronically Signed   By: Lucrezia Europe M.D.   On: 05/21/2016 15:45    ASSESSMENT: This is a very pleasant 55 years old white female recently diagnosed with extensive stage (T2a, N3,  [name] in addition to large right anterior mediastinal and supraclavicular lymphadenopathy as well as pancreatic and questionably splenic metastatic lesions diagnosed in September 2017.   PLAN: I had a lengthy discussion with the patient and her husband today about her current disease stage, prognosis and treatment options. I explained to the patient that she has incurable condition with poor survival. I also explained to the patient that on the treatment will be off palliative nature. I discussed with her the prognosis with and without treatment and she understands that the median survival for extensive stage small cell lung cancer with treatment is around 9 months. I gave her the option of palliative care and hospice referral versus consideration of systemic chemotherapy with carboplatin for AUC of 5 on day 1 and etoposide 100 MG/M2 on days 1, 2 and 3 with Neulasta support. The patient is interested in proceeding with treatment. I discussed with her the adverse effect of this treatment including but not limited to alopecia, myelosuppression, nausea and vomiting, peripheral neuropathy, liver or  renal dysfunction. She is expected to start the first dose of this treatment next week. I will complete the staging workup by ordering a MRI of the brain and the patient already has a PET scan scheduled to be performed next week. For smoke cessation, I strongly encouraged the patient to quit smoking and offered her smoke cessation program. For the hypokalemia, I started the patient on K dur 40 mEq by mouth daily for 10 days. She will have a chemotherapy therapy medication class before starting the first dose of her treatment next week. The patient would come back for follow-up visit in 2 weeks for reevaluation and management of any adverse effect of her treatment. She was seen during the multidisciplinary thoracic oncology clinic today by medical  oncology, thoracic navigator, social worker and physical therapist. I will also refer the patient to interventional radiology for consideration of Port-A-Cath placement. I will call her pharmacy with prescription for Compazine 10 mg by mouth every 6 hours as needed for nausea in addition to Emla cream to be applied to the Port-A-Cath site before treatment.. I will consider referring her to radiation oncology if she becomes more symptomatic from her disease. She was advised to call immediately if she has any concerning symptoms in the interval. The patient voices understanding of current disease status and treatment options and is in agreement with the current care plan.  All questions were answered. The patient knows to call the clinic with any problems, questions or concerns. We can certainly see the patient much sooner if necessary.  Thank you so much for allowing me to participate in the care of Debra Kidd. I will continue to follow up the patient with you and assist in her care.  I spent 55 minutes counseling the patient face to face. The total time spent in the appointment was 80 minutes.  Disclaimer: This note was dictated with voice recognition software. Similar sounding words can inadvertently be transcribed and may not be corrected upon review.   Lanay Zinda K. May 30, 2016, 3:15 PM

## 2016-05-30 NOTE — Progress Notes (Signed)
START ON PATHWAY REGIMEN - Small Cell Lung  [id number]: Etoposide 100 mg/m2 Days 1, 2, 3 + Carboplatin AUC=5 Day 1 q21 Days x 4 Cycles   A cycle is every 21 days:     Etoposide (Toposar(R)) 100 mg/m2 in 500 mL NS IV over 2 hours days 1-3 Dose Mod: None     Carboplatin (Paraplatin(R)) AUC=5 in 500 mL NS IV over 1 hour day 1 only Dose Mod: None Additional Orders: * All AUC calculations intended to be used in [employer] formula  **Always confirm dose/schedule in your pharmacy ordering system**    Patient Characteristics: Extensive Stage, First Line Stage Grouping: Extensive AJCC M Stage: 1 AJCC N Stage: 3 AJCC T Stage: 2 Line of therapy: First Line Would you be surprised if this patient died  in the next year? I would NOT be surprised if this patient died in the next year  Intent of Therapy: Non-Curative / Palliative Intent, Discussed with Patient

## 2016-05-30 NOTE — Patient Instructions (Signed)
Smoking Cessation, Tips for Success If you are ready to quit smoking, congratulations! You have chosen to help yourself be healthier. Cigarettes bring nicotine, tar, carbon monoxide, and other irritants into your body. Your lungs, heart, and blood vessels will be able to work better without these poisons. There are many different ways to quit smoking. Nicotine gum, nicotine patches, a nicotine inhaler, or nicotine nasal spray can help with physical craving. Hypnosis, support groups, and medicines help break the habit of smoking. WHAT THINGS CAN I DO TO MAKE QUITTING EASIER?  Here are some tips to help you quit for good:  Pick a date when you will quit smoking completely. Tell all of your friends and family about your plan to quit on that date.  Do not try to slowly cut down on the number of cigarettes you are smoking. Pick a quit date and quit smoking completely starting on that day.  Throw away all cigarettes.   Clean and remove all ashtrays from your home, work, and car.  On a card, write down your reasons for quitting. Carry the card with you and read it when you get the urge to smoke.  Cleanse your body of nicotine. Drink enough water and fluids to keep your urine clear or pale yellow. Do this after quitting to flush the nicotine from your body.  Learn to predict your moods. Do not let a bad situation be your excuse to have a cigarette. Some situations in your life might tempt you into wanting a cigarette.  Never have "just one" cigarette. It leads to wanting another and another. Remind yourself of your decision to quit.  Change habits associated with smoking. If you smoked while driving or when feeling stressed, try other activities to replace smoking. Stand up when drinking your coffee. Brush your teeth after eating. Sit in a different chair when you read the paper. Avoid alcohol while trying to quit, and try to drink fewer caffeinated beverages. Alcohol and caffeine may urge you to  smoke.  Avoid foods and drinks that can trigger a desire to smoke, such as sugary or spicy foods and alcohol.  Ask people who smoke not to smoke around you.  Have something planned to do right after eating or having a cup of coffee. For example, plan to take a walk or exercise.  Try a relaxation exercise to calm you down and decrease your stress. Remember, you may be tense and nervous for the first 2 weeks after you quit, but this will pass.  Find new activities to keep your hands busy. Play with a pen, coin, or rubber band. Doodle or draw things on paper.  Brush your teeth right after eating. This will help cut down on the craving for the taste of tobacco after meals. You can also try mouthwash.   Use oral substitutes in place of cigarettes. Try using lemon drops, carrots, cinnamon sticks, or chewing gum. Keep them handy so they are available when you have the urge to smoke.  When you have the urge to smoke, try deep breathing.  Designate your home as a nonsmoking area.  If you are a heavy smoker, ask your health care provider about a prescription for nicotine chewing gum. It can ease your withdrawal from nicotine.  Reward yourself. Set aside the cigarette money you save and buy yourself something nice.  Look for support from others. Join a support group or smoking cessation program. Ask someone at home or at work to help you with your plan   to quit smoking.  Always ask yourself, "Do I need this cigarette or is this just a reflex?" Tell yourself, "Today, I choose not to smoke," or "I do not want to smoke." You are reminding yourself of your decision to quit.  Do not replace cigarette smoking with electronic cigarettes (commonly called e-cigarettes). The safety of e-cigarettes is unknown, and some may contain harmful chemicals.  If you relapse, do not give up! Plan ahead and think about what you will do the next time you get the urge to smoke. HOW WILL I FEEL WHEN I QUIT SMOKING? You  may have symptoms of withdrawal because your body is used to nicotine (the addictive substance in cigarettes). You may crave cigarettes, be irritable, feel very hungry, cough often, get headaches, or have difficulty concentrating. The withdrawal symptoms are only temporary. They are strongest when you first quit but will go away within 10-14 days. When withdrawal symptoms occur, stay in control. Think about your reasons for quitting. Remind yourself that these are signs that your body is healing and getting used to being without cigarettes. Remember that withdrawal symptoms are easier to treat than the major diseases that smoking can cause.  Even after the withdrawal is over, expect periodic urges to smoke. However, these cravings are generally short lived and will go away whether you smoke or not. Do not smoke! WHAT RESOURCES ARE AVAILABLE TO HELP ME QUIT SMOKING? Your health care provider can direct you to community resources or hospitals for support, which may include:  Group support.  Education.  Hypnosis.  Therapy.   This information is not intended to replace advice given to you by your health care provider. Make sure you discuss any questions you have with your health care provider.   Document Released: 05/17/2004 Document Revised: 09/09/2014 Document Reviewed: 02/04/2013 [employer] Interactive Patient Education 2016 [employer]  

## 2016-05-30 NOTE — Therapy (Signed)
[place], [place], [address] Phone: [phone]   Fax:  [phone]  Physical Therapy Evaluation  Patient Details  Name: Debra Kidd MRN: [id number] Date of Birth: [birth year] Referring Provider: [name], MD  Encounter Date: 05/30/2016      PT End of Session - 05/30/16 1427    Visit Number 1   Number of Visits 1   PT Start Time 2620   PT Stop Time 1411   PT Time Calculation (min) 23 min   Activity Tolerance Patient tolerated treatment well   Behavior During Therapy [facility] for tasks assessed/performed      Past Medical History:  Diagnosis Date  . 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 Blood type, Rh positive   . Cancer ([place])   . Chronic pain syndrome   . Hyperlipidemia   . Hypertension   . Osteoporosis     Past Surgical History:  Procedure Laterality Date  . ABDOMINAL HYSTERECTOMY  1981  . EYE SURGERY     left  . SKIN CANCER EXCISION     basal cell carcinoma L side of face    There were no vitals filed for this visit.       Subjective Assessment - 05/30/16 1415    Subjective Patient states she doesn't have any issues right now.   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 Current plan is chemotherapy and then radiation if there is any shortness of breath. Patient is a smoker with a history of basal cell carcinoma of the left cheek and osteoporosis.   Patient Stated Goals to obtain information from lung multidisciplinary clinic providers.   Currently in Pain? No/denies            [facility] PT Assessment - 05/30/16 0001      Assessment   Medical Diagnosis RUL small cell carcinoma   Referring Provider [name], MD     Precautions   Precaution Comments active cancer, osteoporosis     Restrictions   Weight Bearing  Restrictions No     Balance Screen   Has the patient fallen in the past 6 months No   Has the patient had a decrease in activity level because of a fear of falling?  No   Is the patient reluctant to leave their home because of a fear of falling?  No     Home Environment   Living Environment Private residence   Living Arrangements Spouse/significant other   Type of [place] Access Level entry   [place] One level     Prior Function   Level of Independence Independent   Leisure no regular exercise     Cognition   Overall Cognitive Status Within Functional Limits for tasks assessed     Functional Tests   Functional tests Sit to Stand     Sit to Stand   Comments able to complete 9 repetitions during 30 second sit to stand, below average for her age     Posture/Postural Control   Posture Comments mildly forward head     ROM / Strength   AROM / PROM / Strength AROM     AROM   Overall AROM Comments trunk AROM WFL for forward flexion, extension, bilateral sidebending, and bilateral rotation     Ambulation/Gait  Ambulation/Gait Yes   Ambulation/Gait Assistance 7: Independent     Balance   Balance Assessed Yes     Dynamic Standing Balance   Dynamic Standing - Comments able to reach 11 inches during standing Functional Reach test, below average for age                           PT Education - 05/30/16 1426    Education provided Yes   Education Details energy conservation, walking program, Cure article, posture, deep breathing, PT information   Person(s) Educated Patient;Spouse   Methods Explanation;Demonstration;Handout   Comprehension Verbalized understanding;Returned demonstration               Lung Clinic Goals - 05/30/16 1432      Patient will be able to verbalize understanding of the benefit of exercise to decrease fatigue.   Status Achieved     Patient will be able to verbalize the importance of posture.   Status  Achieved     Patient will be able to demonstrate diaphragmatic breathing for improved lung function.   Status Achieved     Patient will be able to verbalize understanding of the role of physical therapy to prevent functional decline and who to contact if physical therapy is needed.   Status Achieved             Plan - 05/30/16 1427    Clinical Impression Statement Patient is a 55 year old female diagnosed with extensive small cell carcinoma with current plan to begin chemotherapy. Patient is an independent ambulator with good trunk AROM. She demonstrates decreased standing balance with the standing functional reach test and completed fewer than average repetitions during 30 second sit to stand. She was instructed to inform her doctor should she have any needs for physical therapy.   PT Frequency One time visit   PT Treatment/Interventions ADLs/Self Care Home Management;Patient/family education   PT Next Visit Plan currently has no needs for physical therapy   Consulted and Agree with Plan of Care Patient      Patient will benefit from skilled therapeutic intervention in order to improve the following deficits and impairments:  Decreased balance, Decreased endurance  Visit Diagnosis: Other symptoms and signs involving the musculoskeletal system  Unsteadiness on feet     Problem List Patient Active Problem List   Diagnosis Date Noted  . Mediastinal mass 05/13/2016    Mellody Life 05/30/2016, 2:32 PM  [place], [place], [address] Phone: [phone]   Fax:  [phone]  Name: Debra Kidd MRN: [id number] Date of Birth: Aug 09, 1961  Saverio Danker, SPT  This entire session was guided, instructed, and directly supervised by Serafina Royals, PT.  Read, reviewed, edited and agree with student's findings and recommendations.   Serafina Royals, PT 05/30/16 3:18 PM

## 2016-05-30 NOTE — Progress Notes (Signed)
[facility] Clinical Social Work  Clinical Social Work met with patient/family and Futures trader at [facility] appointment to offer support and assess for psychosocial needs.  Medical oncologist reviewed patient's diagnosis and recommended treatment plan with patient/family.  Following visit with medical oncologist, patient shared she not interested in discussing her concerns or emotional reaction to diagnosis and recommended plan.  Patient was accompanied by her spouse, [name].    Clinical Social Work briefly discussed Clinical Social Work role and [employer] support programs/services.  Clinical Social Work encouraged patient to call with any additional questions or concerns.   Polo Riley, MSW, LCSW, OSW-C Clinical Social Worker [facility] [phone]

## 2016-05-31 ENCOUNTER — Other Ambulatory Visit: Payer: Self-pay | Admitting: Radiology

## 2016-06-03 ENCOUNTER — Encounter (HOSPITAL_COMMUNITY): Payer: Self-pay

## 2016-06-03 ENCOUNTER — Ambulatory Visit (HOSPITAL_COMMUNITY)
Admission: RE | Admit: 2016-06-03 | Discharge: 2016-06-03 | Disposition: A | Discharge status: Home / Self Care | Payer: Medicare Other | Source: Ambulatory Visit | Attending: Internal Medicine | Admitting: Internal Medicine

## 2016-06-03 ENCOUNTER — Other Ambulatory Visit: Payer: Self-pay | Admitting: Internal Medicine

## 2016-06-03 ENCOUNTER — Telehealth: Payer: Self-pay | Admitting: Internal Medicine

## 2016-06-03 DIAGNOSIS — F17210 Nicotine dependence, cigarettes, uncomplicated: Secondary | ICD-10-CM | POA: Diagnosis not present

## 2016-06-03 DIAGNOSIS — Z8249 Family history of ischemic heart disease and other diseases of the circulatory system: Secondary | ICD-10-CM | POA: Insufficient documentation

## 2016-06-03 DIAGNOSIS — E785 Hyperlipidemia, unspecified: Secondary | ICD-10-CM | POA: Diagnosis not present

## 2016-06-03 DIAGNOSIS — M810 Age-related osteoporosis without current pathological fracture: Secondary | ICD-10-CM | POA: Insufficient documentation

## 2016-06-03 DIAGNOSIS — Z803 Family history of malignant neoplasm of breast: Secondary | ICD-10-CM | POA: Diagnosis not present

## 2016-06-03 DIAGNOSIS — G894 Chronic pain syndrome: Secondary | ICD-10-CM | POA: Insufficient documentation

## 2016-06-03 DIAGNOSIS — Z85828 Personal history of other malignant neoplasm of skin: Secondary | ICD-10-CM | POA: Insufficient documentation

## 2016-06-03 DIAGNOSIS — Z23 Encounter for immunization: Secondary | ICD-10-CM

## 2016-06-03 DIAGNOSIS — I10 Essential (primary) hypertension: Secondary | ICD-10-CM | POA: Insufficient documentation

## 2016-06-03 DIAGNOSIS — C3491 Malignant neoplasm of unspecified part of right bronchus or lung: Secondary | ICD-10-CM | POA: Insufficient documentation

## 2016-06-03 HISTORY — PX: IR GENERIC HISTORICAL: IMG1180011

## 2016-06-03 LAB — BASIC METABOLIC PANEL
ANION GAP: 10 (ref 5–15)
BUN: 5 mg/dL — AB (ref 6–20)
CO2: 26 mmol/L (ref 22–32)
Calcium: 8.8 mg/dL — ABNORMAL LOW (ref 8.9–10.3)
Chloride: 99 mmol/L — ABNORMAL LOW (ref 101–111)
Creatinine, Ser: 0.6 mg/dL (ref 0.44–1.00)
Glucose, Bld: 112 mg/dL — ABNORMAL HIGH (ref 65–99)
POTASSIUM: 2.8 mmol/L — AB (ref 3.5–5.1)
SODIUM: 135 mmol/L (ref 135–145)

## 2016-06-03 LAB — CBC WITH DIFFERENTIAL/PLATELET
BASOS ABS: 0 10*3/uL (ref 0.0–0.1)
Basophils Relative: 0 %
EOS ABS: 0.1 10*3/uL (ref 0.0–0.7)
EOS PCT: 1 %
HCT: 39.9 % (ref 36.0–46.0)
Hemoglobin: 13.3 g/dL (ref 12.0–15.0)
LYMPHS PCT: 16 %
Lymphs Abs: 1.4 10*3/uL (ref 0.7–4.0)
MCH: 34.1 pg — ABNORMAL HIGH (ref 26.0–34.0)
MCHC: 33.3 g/dL (ref 30.0–36.0)
MCV: 102.3 fL — AB (ref 78.0–100.0)
Monocytes Absolute: 0.6 10*3/uL (ref 0.1–1.0)
Monocytes Relative: 7 %
Neutro Abs: 6.5 10*3/uL (ref 1.7–7.7)
Neutrophils Relative %: 76 %
PLATELETS: 372 10*3/uL (ref 150–400)
RBC: 3.9 MIL/uL (ref 3.87–5.11)
RDW: 13.3 % (ref 11.5–15.5)
WBC: 8.6 10*3/uL (ref 4.0–10.5)

## 2016-06-03 LAB — PROTIME-INR
INR: 0.96
PROTHROMBIN TIME: 12.8 s (ref 11.4–15.2)

## 2016-06-03 MED ORDER — FENTANYL CITRATE (PF) 100 MCG/2ML IJ SOLN
INTRAMUSCULAR | Status: AC
  Filled 2016-06-03: qty 4

## 2016-06-03 MED ORDER — CEFAZOLIN SODIUM-DEXTROSE 2-4 GM/100ML-% IV SOLN
2.0000 g | INTRAVENOUS | Status: AC
  Administered 2016-06-03: 2 g via INTRAVENOUS
  Filled 2016-06-03: qty 100

## 2016-06-03 MED ORDER — MIDAZOLAM HCL 2 MG/2ML IJ SOLN
INTRAMUSCULAR | Status: AC | PRN
  Administered 2016-06-03 (×5): 1 mg via INTRAVENOUS
  Administered 2016-06-03: 2 mg via INTRAVENOUS

## 2016-06-03 MED ORDER — POTASSIUM CHLORIDE CRYS ER 20 MEQ PO TBCR
40.0000 meq | EXTENDED_RELEASE_TABLET | ORAL | Status: DC
  Filled 2016-06-03: qty 2

## 2016-06-03 MED ORDER — LIDOCAINE-EPINEPHRINE (PF) 2 %-1:200000 IJ SOLN
INTRAMUSCULAR | Status: AC
  Filled 2016-06-03: qty 20

## 2016-06-03 MED ORDER — LIDOCAINE HCL 1 % IJ SOLN
INTRAMUSCULAR | Status: AC
  Filled 2016-06-03: qty 20

## 2016-06-03 MED ORDER — POTASSIUM CHLORIDE 20 MEQ/15ML (10%) PO SOLN
40.0000 meq | ORAL | Status: AC
  Administered 2016-06-03: 40 meq via ORAL
  Filled 2016-06-03: qty 30

## 2016-06-03 MED ORDER — FENTANYL CITRATE (PF) 100 MCG/2ML IJ SOLN
INTRAMUSCULAR | Status: AC | PRN
  Administered 2016-06-03: 50 ug via INTRAVENOUS
  Administered 2016-06-03 (×5): 25 ug via INTRAVENOUS

## 2016-06-03 MED ORDER — HEPARIN SOD (PORK) LOCK FLUSH 100 UNIT/ML IV SOLN
INTRAVENOUS | Status: AC | PRN
  Administered 2016-06-03: 500 [IU]

## 2016-06-03 MED ORDER — MIDAZOLAM HCL 2 MG/2ML IJ SOLN
INTRAMUSCULAR | Status: AC
  Filled 2016-06-03: qty 8

## 2016-06-03 MED ORDER — SODIUM CHLORIDE 0.9 % IV SOLN
INTRAVENOUS | Status: DC
  Administered 2016-06-03: 10:00:00 via INTRAVENOUS

## 2016-06-03 MED ORDER — HEPARIN SOD (PORK) LOCK FLUSH 100 UNIT/ML IV SOLN
INTRAVENOUS | Status: AC
  Filled 2016-06-03: qty 5

## 2016-06-03 NOTE — Procedures (Signed)
Interventional Radiology Procedure Note  Procedure: Placement of a right IJ approach single lumen PowerPort.  Tip is positioned at the superior cavoatrial junction and catheter is ready for immediate use.  Complications: No immediate Recommendations:  - Ok to shower tomorrow - Do not submerge for 7 days - Routine line care   Landy Mace T. Finnbar Cedillos, M.D Pager:  [phone]   

## 2016-06-03 NOTE — Discharge Instructions (Signed)
Moderate Conscious Sedation, Adult, Care After Refer to this sheet in the next few weeks. These instructions provide you with information on caring for yourself after your procedure. Your health care provider may also give you more specific instructions. Your treatment has been planned according to current medical practices, but problems sometimes occur. Call your health care provider if you have any problems or questions after your procedure. WHAT TO EXPECT AFTER THE PROCEDURE  After your procedure:  You may feel sleepy, clumsy, and have poor balance for several hours.  Vomiting may occur if you eat too soon after the procedure. HOME CARE INSTRUCTIONS  Do not participate in any activities where you could become injured for at least 24 hours. Do not:  Drive.  Swim.  Ride a bicycle.  Operate heavy machinery.  Cook.  Use power tools.  Climb ladders.  Work from a high place.  Do not make important decisions or sign legal documents until you are improved.  If you vomit, drink water, juice, or soup when you can drink without vomiting. Make sure you have little or no nausea before eating solid foods.  Only take over-the-counter or prescription medicines for pain, discomfort, or fever as directed by your health care provider.  Make sure you and your family fully understand everything about the medicines given to you, including what side effects may occur.  You should not drink alcohol, take sleeping pills, or take medicines that cause drowsiness for at least 24 hours.  If you smoke, do not smoke without supervision.  If you are feeling better, you may resume normal activities 24 hours after you were sedated.  Keep all appointments with your health care provider. SEEK MEDICAL CARE IF:  Your skin is pale or bluish in color.  You continue to feel nauseous or vomit.  Your pain is getting worse and is not helped by medicine.  You have bleeding or swelling.  You are still  sleepy or feeling clumsy after 24 hours. SEEK IMMEDIATE MEDICAL CARE IF:  You develop a rash.  You have difficulty breathing.  You develop any type of allergic problem.  You have a fever. MAKE SURE YOU:  Understand these instructions.  Will watch your condition.  Will get help right away if you are not doing well or get worse.   This information is not intended to replace advice given to you by your health care provider. Make sure you discuss any questions you have with your health care provider.   Document Released: 06/09/2013 Document Revised: 09/09/2014 Document Reviewed: 06/09/2013 Elsevier Interactive Patient Education 2016 [place] An implanted port is a type of central line that is placed under the skin. Central lines are used to provide IV access when treatment or nutrition needs to be given through a person's veins. Implanted ports are used for long-term IV access. An implanted port may be placed because:   You need IV medicine that would be irritating to the small veins in your hands or arms.   You need long-term IV medicines, such as antibiotics.   You need IV nutrition for a long period.   You need frequent blood draws for lab tests.   You need dialysis.  Implanted ports are usually placed in the chest area, but they can also be placed in the upper arm, the abdomen, or the leg. An implanted port has two main parts:   Reservoir. The reservoir is round and will appear as a small, raised area under  your skin. The reservoir is the part where a needle is inserted to give medicines or draw blood.   Catheter. The catheter is a thin, flexible tube that extends from the reservoir. The catheter is placed into a large vein. Medicine that is inserted into the reservoir goes into the catheter and then into the vein.  HOW WILL I CARE FOR MY INCISION SITE? Do not get the incision site wet. Bathe or shower as directed by your health care  provider.  HOW IS MY PORT ACCESSED? Special steps must be taken to access the port:   Before the port is accessed, a numbing cream can be placed on the skin. This helps numb the skin over the port site.   Your health care provider uses a sterile technique to access the port.  Your health care provider must put on a mask and sterile gloves.  The skin over your port is cleaned carefully with an antiseptic and allowed to dry.  The port is gently pinched between sterile gloves, and a needle is inserted into the port.  Only "non-coring" port needles should be used to access the port. Once the port is accessed, a blood return should be checked. This helps ensure that the port is in the vein and is not clogged.   If your port needs to remain accessed for a constant infusion, a clear (transparent) bandage will be placed over the needle site. The bandage and needle will need to be changed every week, or as directed by your health care provider.   Keep the bandage covering the needle clean and dry. Do not get it wet. Follow your health care provider's instructions on how to take a shower or bath while the port is accessed.   If your port does not need to stay accessed, no bandage is needed over the port.  WHAT IS FLUSHING? Flushing helps keep the port from getting clogged. Follow your health care provider's instructions on how and when to flush the port. Ports are usually flushed with saline solution or a medicine called heparin. The need for flushing will depend on how the port is used.   If the port is used for intermittent medicines or blood draws, the port will need to be flushed:   After medicines have been given.   After blood has been drawn.   As part of routine maintenance.   If a constant infusion is running, the port may not need to be flushed.  HOW LONG WILL MY PORT STAY IMPLANTED? The port can stay in for as long as your health care provider thinks it is needed. When it  is time for the port to come out, surgery will be done to remove it. The procedure is similar to the one performed when the port was put in.  WHEN SHOULD I SEEK IMMEDIATE MEDICAL CARE? When you have an implanted port, you should seek immediate medical care if:   You notice a bad smell coming from the incision site.   You have swelling, redness, or drainage at the incision site.   You have more swelling or pain at the port site or the surrounding area.   You have a fever that is not controlled with medicine.   This information is not intended to replace advice given to you by your health care provider. Make sure you discuss any questions you have with your health care provider.   Document Released: 08/19/2005 Document Revised: 06/09/2013 Document Reviewed: 04/26/2013 Elsevier Interactive Patient Education  2016 [place] Insertion, Care After Refer to this sheet in the next few weeks. These instructions provide you with information on caring for yourself after your procedure. Your health care provider may also give you more specific instructions. Your treatment has been planned according to current medical practices, but problems sometimes occur. Call your health care provider if you have any problems or questions after your procedure. WHAT TO EXPECT AFTER THE PROCEDURE After your procedure, it is typical to have the following:   Discomfort at the port insertion site. Ice packs to the area will help.  Bruising on the skin over the port. This will subside in 3-4 days. HOME CARE INSTRUCTIONS  After your port is placed, you will get a manufacturer's information card. The card has information about your port. Keep this card with you at all times.   Know what kind of port you have. There are many types of ports available.   Wear a medical alert bracelet in case of an emergency. This can help alert health care workers that you have a port.   The port can stay in for  as long as your health care provider believes it is necessary.   A home health care nurse may give medicines and take care of the port.   You or a family member can get special training and directions for giving medicine and taking care of the port at home.  SEEK MEDICAL CARE IF:   Your port does not flush or you are unable to get a blood return.   You have a fever or chills. SEEK IMMEDIATE MEDICAL CARE IF:  You have new fluid or pus coming from your incision.   You notice a bad smell coming from your incision site.   You have swelling, pain, or more redness at the incision or port site.   You have chest pain or shortness of breath.   This information is not intended to replace advice given to you by your health care provider. Make sure you discuss any questions you have with your health care provider.   Document Released: 06/09/2013 Document Revised: 08/24/2013 Document Reviewed: 06/09/2013 Elsevier Interactive Patient Education 2016 Elsevier Inc. Moderate Conscious Sedation, Adult Sedation is the use of medicines to promote relaxation and relieve discomfort and anxiety. Moderate conscious sedation is a type of sedation. Under moderate conscious sedation you are less alert than normal but are still able to respond to instructions or stimulation. Moderate conscious sedation is used during short medical and dental procedures. It is milder than deep sedation or general anesthesia and allows you to return to your regular activities sooner. LET [facility] CARE PROVIDER KNOW ABOUT:   Any allergies you have.  All medicines you are taking, including vitamins, herbs, eye drops, creams, and over-the-counter medicines.  Use of steroids (by mouth or creams).  Previous problems you or members of your family have had with the use of anesthetics.  Any blood disorders you have.  Previous surgeries you have had.  Medical conditions you have.  Possibility of pregnancy, if this  applies.  Use of cigarettes, alcohol, or illegal drugs. RISKS AND COMPLICATIONS Generally, this is a safe procedure. However, as with any procedure, problems can occur. Possible problems include:  Oversedation.  Trouble breathing on your own. You may need to have a breathing tube until you are awake and breathing on your own.  Allergic reaction to any of the medicines used for the procedure. BEFORE THE PROCEDURE  You  may have blood tests done. These tests can help show how well your kidneys and liver are working. They can also show how well your blood clots.  A physical exam will be done.  Only take medicines as directed by your health care provider. You may need to stop taking medicines (such as blood thinners, aspirin, or nonsteroidal anti-inflammatory drugs) before the procedure.   Do not eat or drink at least 6 hours before the procedure or as directed by your health care provider.  Arrange for a responsible adult, family member, or friend to take you home after the procedure. He or she should stay with you for at least 24 hours after the procedure, until the medicine has worn off. PROCEDURE   An intravenous (IV) catheter will be inserted into one of your veins. Medicine will be able to flow directly into your body through this catheter. You may be given medicine through this tube to help prevent pain and help you relax.  The medical or dental procedure will be done. AFTER THE PROCEDURE  You will stay in a recovery area until the medicine has worn off. Your blood pressure and pulse will be checked.   Depending on the procedure you had, you may be allowed to go home when you can tolerate liquids and your pain is under control.   This information is not intended to replace advice given to you by your health care provider. Make sure you discuss any questions you have with your health care provider.   Document Released: 05/14/2001 Document Revised: 09/09/2014 Document  Reviewed: 04/26/2013 Elsevier Interactive Patient Education [employer].

## 2016-06-03 NOTE — H&P (Signed)
Chief Complaint: right-sided lung cancer  Referring Physician:Dr. [name]  Supervising Physician: Aletta Edouard  Patient Status: Out-pt  HPI: Debra Kidd is an 55 y.o. female who was recently diagnosed with right-sided lung cancer.  She has met with Dr. Julien Nordmann and they have agreed for palliative chemotherapy.  There is no plan for radiation.  She will start chemotherapy soon and a request has been made for a PAC.  She has chronic SOB, but otherwise no other new complaints.  Past Medical History:  Past Medical History:  Diagnosis Date  . 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 Blood type, Rh positive   . Cancer ([place])   . Chronic pain syndrome   . Hyperlipidemia   . Hypertension   . Osteoporosis     Past Surgical History:  Past Surgical History:  Procedure Laterality Date  . ABDOMINAL HYSTERECTOMY  1981  . EYE SURGERY     left  . SKIN CANCER EXCISION     basal cell carcinoma L side of face    Family History:  Family History  Problem Relation Age of Onset  . Heart disease Mother   . Heart disease Father   . Emphysema Brother   . Breast cancer Maternal Grandmother   . Heart disease Brother     Social History:  reports that she has been smoking.  She has a 84.00 pack-year smoking history. She has never used smokeless tobacco. She reports that she uses drugs, including Marijuana, about 7 times per week. She reports that she does not drink alcohol.  Allergies:  Allergies  Allergen Reactions  . Codeine Nausea And Vomiting  . Motrin [Ibuprofen]     Elevation of BP  . [name]     intolerant    Medications: Medications have been reviewed.  Please HPI for pertinent positives, otherwise complete 10 system ROS negative.  Mallampati Score: MD Evaluation Airway: WNL Heart: WNL Abdomen: WNL Chest/ Lungs: WNL ASA  Classification: 2 Mallampati/Airway Score: Two  Physical Exam: BP (!) 144/90 (BP Location: Left Arm)    Pulse (!) 103   Temp 98.7 F (37.1 C) (Oral)   Resp 18   SpO2 100%  There is no height or weight on file to calculate BMI. General: pleasant, WD, WN white female who is laying in bed in NAD HEENT: head is normocephalic, atraumatic.  Sclera are noninjected.  PERRL. Sunglasses are in place.  Ears and nose without any masses or lesions.  Mouth is pink and moist Heart: regular, rate, and rhythm, minimally tachy.  Normal s1,s2. No obvious murmurs, gallops, or rubs noted.  Palpable radial and pedal pulses bilaterally Lungs: CTAB, no wheezes, rhonchi, or rales noted.  Respiratory effort nonlabored Abd: soft, NT, ND, +BS, no masses, hernias, or organomegaly MS: all 4 extremities are symmetrical with no cyanosis, clubbing, or edema. Psych: A&Ox3 with an appropriate affect.   Labs: Pending  Imaging: No results found.  Assessment/Plan 1. Right-sided lung cancer -we will proceed with PAC placement today. -labs are pending.  Her vital signs have been reviewed -her lung cancer is on the right side, but there are no plans for radiation as this is palliative chemotherapy, so location of PAC should not be problematic. -Risks and Benefits discussed with the patient including, but not limited to bleeding, infection, pneumothorax, or fibrin sheath development and need for additional procedures. All of the patient's questions were answered, patient is agreeable to proceed. Consent signed and  in chart.  Thank you for this interesting consult.  I greatly enjoyed meeting Debra Kidd and look forward to participating in their care.  A copy of this report was sent to the requesting provider on this date.  Electronically Signed: Henreitta Cea 06/03/2016, 10:00 AM   I spent a total of    25 Minutes in face to face in clinical consultation, greater than 50% of which was counseling/coordinating care for lung cancer

## 2016-06-03 NOTE — Telephone Encounter (Signed)
SPOKE WITH PATIENT RE NEXT APPOINTMENTS FOR 10/3 AND 10/4. PATIENT TO GET NEW SCHEDULE AT 10/3 VISIT.

## 2016-06-04 ENCOUNTER — Encounter: Payer: Self-pay | Admitting: *Deleted

## 2016-06-04 ENCOUNTER — Other Ambulatory Visit: Payer: Medicare Other

## 2016-06-04 ENCOUNTER — Other Ambulatory Visit: Payer: Self-pay | Admitting: Medical Oncology

## 2016-06-04 DIAGNOSIS — C3491 Malignant neoplasm of unspecified part of right bronchus or lung: Secondary | ICD-10-CM

## 2016-06-05 ENCOUNTER — Other Ambulatory Visit (HOSPITAL_BASED_OUTPATIENT_CLINIC_OR_DEPARTMENT_OTHER): Payer: Medicare Other

## 2016-06-05 ENCOUNTER — Ambulatory Visit (HOSPITAL_BASED_OUTPATIENT_CLINIC_OR_DEPARTMENT_OTHER): Payer: Medicare Other

## 2016-06-05 VITALS — BP 159/94 | HR 100 | Temp 98.5°F | Resp 18

## 2016-06-05 DIAGNOSIS — Z5111 Encounter for antineoplastic chemotherapy: Secondary | ICD-10-CM | POA: Diagnosis present

## 2016-06-05 DIAGNOSIS — C3491 Malignant neoplasm of unspecified part of right bronchus or lung: Secondary | ICD-10-CM

## 2016-06-05 DIAGNOSIS — E876 Hypokalemia: Secondary | ICD-10-CM

## 2016-06-05 DIAGNOSIS — C3411 Malignant neoplasm of upper lobe, right bronchus or lung: Secondary | ICD-10-CM

## 2016-06-05 LAB — COMPREHENSIVE METABOLIC PANEL
ALT: 23 U/L (ref 0–55)
AST: 26 U/L (ref 5–34)
Albumin: 2.8 g/dL — ABNORMAL LOW (ref 3.5–5.0)
Alkaline Phosphatase: 130 U/L (ref 40–150)
Anion Gap: 12 mEq/L — ABNORMAL HIGH (ref 3–11)
BUN: 4.8 mg/dL — ABNORMAL LOW (ref 7.0–26.0)
CO2: 26 meq/L (ref 22–29)
Calcium: 9.1 mg/dL (ref 8.4–10.4)
Chloride: 101 mEq/L (ref 98–109)
Creatinine: 0.8 mg/dL (ref 0.6–1.1)
EGFR: 83 mL/min/{1.73_m2} — AB (ref >90)
GLUCOSE: 107 mg/dL (ref 70–140)
POTASSIUM: 3 meq/L — AB (ref 3.5–5.1)
SODIUM: 139 meq/L (ref 136–145)
Total Bilirubin: 0.46 mg/dL (ref 0.20–1.20)
Total Protein: 6.6 g/dL (ref 6.4–8.3)

## 2016-06-05 LAB — CBC WITH DIFFERENTIAL/PLATELET
BASO%: 0.2 % (ref 0.0–2.0)
Basophils Absolute: 0 10*3/uL (ref 0.0–0.1)
EOS%: 0.9 % (ref 0.0–7.0)
Eosinophils Absolute: 0.1 10*3/uL (ref 0.0–0.5)
HCT: 39.3 % (ref 34.8–46.6)
HEMOGLOBIN: 13.3 g/dL (ref 11.6–15.9)
LYMPH%: 15.4 % (ref 14.0–49.7)
MCH: 34.5 pg — ABNORMAL HIGH (ref 25.1–34.0)
MCHC: 33.8 g/dL (ref 31.5–36.0)
MCV: 101.8 fL — ABNORMAL HIGH (ref 79.5–101.0)
MONO#: 0.7 10*3/uL (ref 0.1–0.9)
MONO%: 7.2 % (ref 0.0–14.0)
NEUT%: 76.3 % (ref 38.4–76.8)
NEUTROS ABS: 6.9 10*3/uL — AB (ref 1.5–6.5)
PLATELETS: 309 10*3/uL (ref 145–400)
RBC: 3.86 10*6/uL (ref 3.70–5.45)
RDW: 13.4 % (ref 11.2–14.5)
WBC: 9.1 10*3/uL (ref 3.9–10.3)
lymph#: 1.4 10*3/uL (ref 0.9–3.3)

## 2016-06-05 MED ORDER — SODIUM CHLORIDE 0.9% FLUSH
10.0000 mL | INTRAVENOUS | Status: DC | PRN
  Administered 2016-06-05: 10 mL
  Filled 2016-06-05: qty 10

## 2016-06-05 MED ORDER — SODIUM CHLORIDE 0.9 % IV SOLN
Freq: Once | INTRAVENOUS | Status: AC
  Administered 2016-06-05: 10:00:00 via INTRAVENOUS

## 2016-06-05 MED ORDER — PALONOSETRON HCL INJECTION 0.25 MG/5ML
0.2500 mg | Freq: Once | INTRAVENOUS | Status: AC
  Administered 2016-06-05: 0.25 mg via INTRAVENOUS

## 2016-06-05 MED ORDER — SODIUM CHLORIDE 0.9 % IV SOLN
100.0000 mg/m2 | Freq: Once | INTRAVENOUS | Status: AC
  Administered 2016-06-05: 160 mg via INTRAVENOUS
  Filled 2016-06-05: qty 8

## 2016-06-05 MED ORDER — HEPARIN SOD (PORK) LOCK FLUSH 100 UNIT/ML IV SOLN
500.0000 [IU] | Freq: Once | INTRAVENOUS | Status: AC | PRN
  Administered 2016-06-05: 500 [IU]
  Filled 2016-06-05: qty 5

## 2016-06-05 MED ORDER — PALONOSETRON HCL INJECTION 0.25 MG/5ML
INTRAVENOUS | Status: AC
  Filled 2016-06-05: qty 5

## 2016-06-05 MED ORDER — SODIUM CHLORIDE 0.9 % IV SOLN
466.0000 mg | Freq: Once | INTRAVENOUS | Status: AC
  Administered 2016-06-05: 470 mg via INTRAVENOUS
  Filled 2016-06-05: qty 47

## 2016-06-05 MED ORDER — POTASSIUM CHLORIDE 2 MEQ/ML IV SOLN
Freq: Once | INTRAVENOUS | Status: AC
  Administered 2016-06-05: 11:00:00 via INTRAVENOUS
  Filled 2016-06-05: qty 250

## 2016-06-05 MED ORDER — DEXAMETHASONE SODIUM PHOSPHATE 100 MG/10ML IJ SOLN
10.0000 mg | Freq: Once | INTRAMUSCULAR | Status: AC
  Administered 2016-06-05: 10 mg via INTRAVENOUS
  Filled 2016-06-05: qty 1

## 2016-06-05 NOTE — Progress Notes (Signed)
Potassium 3.0 pt has not been taking home potassium at home as prescribed. Per Dr. Julien Nordmann okay to treat, pt to receive 20 MEQs IV, pt educated on importance of taking potassium at home as prescribed. Pt verblaizes understanding. Okay to run Potassium along side etoposide and Carboplatin per pharmacy,medications are compatible.    1343: patient tolerated well, Pt stable at discharge. Discharge instructions reviewed and given to patient. Pt B/p elevated and pt states that she was not able to take blood pressure medication last night due to sickness and that it is time to take it now. Dr. Julien Nordmann aware and okay to discharge home, pt educated to take blood pressure medication as soon as she gets home, pt verbalizes understanding.

## 2016-06-05 NOTE — Patient Instructions (Addendum)
[facility] Discharge Instructions for Patients Receiving Chemotherapy  Today you received the following chemotherapy agents, Carboplatin and Etoposide  To help prevent nausea and vomiting after your treatment, we encourage you to take your nausea medication as directed.  If you develop nausea and vomiting that is not controlled by your nausea medication, call the clinic.   BELOW ARE SYMPTOMS THAT SHOULD BE REPORTED IMMEDIATELY:  *FEVER GREATER THAN 100.5 F  *CHILLS WITH OR WITHOUT FEVER  NAUSEA AND VOMITING THAT IS NOT CONTROLLED WITH YOUR NAUSEA MEDICATION  *UNUSUAL SHORTNESS OF BREATH  *UNUSUAL BRUISING OR BLEEDING  TENDERNESS IN MOUTH AND THROAT WITH OR WITHOUT PRESENCE OF ULCERS  *URINARY PROBLEMS  *BOWEL PROBLEMS  UNUSUAL RASH Items with * indicate a potential emergency and should be followed up as soon as possible.  Feel free to call the clinic you have any questions or concerns. The clinic phone number is [phone].  Please show the [facility] at check-in to the Emergency Department and triage nurse.   Carboplatin injection What is this medicine? CARBOPLATIN (KAR boe pla tin) is a chemotherapy drug. It targets fast dividing cells, like cancer cells, and causes these cells to die. This medicine is used to treat ovarian cancer and many other cancers. This medicine may be used for other purposes; ask your health care provider or pharmacist if you have questions. What should I tell my health care provider before I take this medicine? They need to know if you have any of these conditions: -blood disorders -hearing problems -kidney disease -recent or ongoing radiation therapy -an unusual or allergic reaction to carboplatin, cisplatin, other chemotherapy, other medicines, foods, dyes, or preservatives -pregnant or trying to get pregnant -breast-feeding How should I use this medicine? This drug is usually given as an infusion into a vein. It is  administered in a hospital or clinic by a specially trained health care professional. Talk to your pediatrician regarding the use of this medicine in children. Special care may be needed. Overdosage: If you think you have taken too much of this medicine contact a poison control center or emergency room at once. NOTE: This medicine is only for you. Do not share this medicine with others. What if I miss a dose? It is important not to miss a dose. Call your doctor or health care professional if you are unable to keep an appointment. What may interact with this medicine? -medicines for seizures -medicines to increase blood counts like filgrastim, pegfilgrastim, sargramostim -some antibiotics like amikacin, gentamicin, neomycin, streptomycin, tobramycin -vaccines Talk to your doctor or health care professional before taking any of these medicines: -acetaminophen -aspirin -ibuprofen -ketoprofen -naproxen This list may not describe all possible interactions. Give your health care provider a list of all the medicines, herbs, non-prescription drugs, or dietary supplements you use. Also tell them if you smoke, drink alcohol, or use illegal drugs. Some items may interact with your medicine. What should I watch for while using this medicine? Your condition will be monitored carefully while you are receiving this medicine. You will need important blood work done while you are taking this medicine. This drug may make you feel generally unwell. This is not uncommon, as chemotherapy can affect healthy cells as well as cancer cells. Report any side effects. Continue your course of treatment even though you feel ill unless your doctor tells you to stop. In some cases, you may be given additional medicines to help with side effects. Follow all directions for their  use. Call your doctor or health care professional for advice if you get a fever, chills or sore throat, or other symptoms of a cold or flu. Do not  treat yourself. This drug decreases your body's ability to fight infections. Try to avoid being around people who are sick. This medicine may increase your risk to bruise or bleed. Call your doctor or health care professional if you notice any unusual bleeding. Be careful brushing and flossing your teeth or using a toothpick because you may get an infection or bleed more easily. If you have any dental work done, tell your dentist you are receiving this medicine. Avoid taking products that contain aspirin, acetaminophen, ibuprofen, naproxen, or ketoprofen unless instructed by your doctor. These medicines may hide a fever. Do not become pregnant while taking this medicine. Women should inform their doctor if they wish to become pregnant or think they might be pregnant. There is a potential for serious side effects to an unborn child. Talk to your health care professional or pharmacist for more information. Do not breast-feed an infant while taking this medicine. What side effects may I notice from receiving this medicine? Side effects that you should report to your doctor or health care professional as soon as possible: -allergic reactions like skin rash, itching or hives, swelling of the face, lips, or tongue -signs of infection - fever or chills, cough, sore throat, pain or difficulty passing urine -signs of decreased platelets or bleeding - bruising, pinpoint red spots on the skin, black, tarry stools, nosebleeds -signs of decreased red blood cells - unusually weak or tired, fainting spells, lightheadedness -breathing problems -changes in hearing -changes in vision -chest pain -high blood pressure -low blood counts - This drug may decrease the number of white blood cells, red blood cells and platelets. You may be at increased risk for infections and bleeding. -nausea and vomiting -pain, swelling, redness or irritation at the injection site -pain, tingling, numbness in the hands or feet -problems  with balance, talking, walking -trouble passing urine or change in the amount of urine Side effects that usually do not require medical attention (report to your doctor or health care professional if they continue or are bothersome): -hair loss -loss of appetite -metallic taste in the mouth or changes in taste This list may not describe all possible side effects. Call your doctor for medical advice about side effects. You may report side effects to FDA at 1-800-FDA-1088. Where should I keep my medicine? This drug is given in a hospital or clinic and will not be stored at home. NOTE: This sheet is a summary. It may not cover all possible information. If you have questions about this medicine, talk to your doctor, pharmacist, or health care provider.    2016, Elsevier/Gold Standard. (2007-11-24 14:38:05)   Etoposide, VP-16 injection What is this medicine? ETOPOSIDE, VP-16 (e toe POE side) is a chemotherapy drug. It is used to treat testicular cancer, lung cancer, and other cancers. This medicine may be used for other purposes; ask your health care provider or pharmacist if you have questions. What should I tell my health care provider before I take this medicine? They need to know if you have any of these conditions: -infection -kidney disease -low blood counts, like low white cell, platelet, or red cell counts -an unusual or allergic reaction to etoposide, other chemotherapeutic agents, other medicines, foods, dyes, or preservatives -pregnant or trying to get pregnant -breast-feeding How should I use this medicine? This medicine is for  infusion into a vein. It is administered in a hospital or clinic by a specially trained health care professional. Talk to your pediatrician regarding the use of this medicine in children. Special care may be needed. Overdosage: If you think you have taken too much of this medicine contact a poison control center or emergency room at once. NOTE: This  medicine is only for you. Do not share this medicine with others. What if I miss a dose? It is important not to miss your dose. Call your doctor or health care professional if you are unable to keep an appointment. What may interact with this medicine? -aspirin -certain medications for seizures like carbamazepine, phenobarbital, phenytoin, valproic acid -cyclosporine -levamisole -warfarin This list may not describe all possible interactions. Give your health care provider a list of all the medicines, herbs, non-prescription drugs, or dietary supplements you use. Also tell them if you smoke, drink alcohol, or use illegal drugs. Some items may interact with your medicine. What should I watch for while using this medicine? Visit your doctor for checks on your progress. This drug may make you feel generally unwell. This is not uncommon, as chemotherapy can affect healthy cells as well as cancer cells. Report any side effects. Continue your course of treatment even though you feel ill unless your doctor tells you to stop. In some cases, you may be given additional medicines to help with side effects. Follow all directions for their use. Call your doctor or health care professional for advice if you get a fever, chills or sore throat, or other symptoms of a cold or flu. Do not treat yourself. This drug decreases your body's ability to fight infections. Try to avoid being around people who are sick. This medicine may increase your risk to bruise or bleed. Call your doctor or health care professional if you notice any unusual bleeding. Be careful brushing and flossing your teeth or using a toothpick because you may get an infection or bleed more easily. If you have any dental work done, tell your dentist you are receiving this medicine. Avoid taking products that contain aspirin, acetaminophen, ibuprofen, naproxen, or ketoprofen unless instructed by your doctor. These medicines may hide a fever. Do not  become pregnant while taking this medicine or for at least 6 months after stopping it. Women should inform their doctor if they wish to become pregnant or think they might be pregnant. Women of child-bearing potential will need to have a negative pregnancy test before starting this medicine. There is a potential for serious side effects to an unborn child. Talk to your health care professional or pharmacist for more information. Do not breast-feed an infant while taking this medicine. Men must use a latex condom during sexual contact with a woman while taking this medicine and for at least 4 months after stopping it. A latex condom is needed even if you have had a vasectomy. Contact your doctor right away if your partner becomes pregnant. Do not donate sperm while taking this medicine and for at least 4 months after you stop taking this medicine. Men should inform their doctors if they wish to father a child. This medicine may lower sperm counts. What side effects may I notice from receiving this medicine? Side effects that you should report to your doctor or health care professional as soon as possible: -allergic reactions like skin rash, itching or hives, swelling of the face, lips, or tongue -low blood counts - this medicine may decrease the number of white  blood cells, red blood cells and platelets. You may be at increased risk for infections and bleeding. -signs of infection - fever or chills, cough, sore throat, pain or difficulty passing urine -signs of decreased platelets or bleeding - bruising, pinpoint red spots on the skin, black, tarry stools, blood in the urine -signs of decreased red blood cells - unusually weak or tired, fainting spells, lightheadedness -breathing problems -changes in vision -mouth or throat sores or ulcers -pain, redness, swelling or irritation at the injection site -pain, tingling, numbness in the hands or feet -redness, blistering, peeling or loosening of the skin,  including inside the mouth -seizures -vomiting Side effects that usually do not require medical attention (report to your doctor or health care professional if they continue or are bothersome): -diarrhea -hair loss -loss of appetite -nausea -stomach pain This list may not describe all possible side effects. Call your doctor for medical advice about side effects. You may report side effects to FDA at 1-800-FDA-1088. Where should I keep my medicine? This drug is given in a hospital or clinic and will not be stored at home. NOTE: This sheet is a summary. It may not cover all possible information. If you have questions about this medicine, talk to your doctor, pharmacist, or health care provider.    2016, Elsevier/Gold Standard. (2014-04-14 12:32:50)   Hypokalemia Hypokalemia means that the amount of potassium in the blood is lower than normal.Potassium is a chemical, called an electrolyte, that helps regulate the amount of fluid in the body. It also stimulates muscle contraction and helps nerves function properly.Most of the body's potassium is inside of cells, and only a very small amount is in the blood. Because the amount in the blood is so small, minor changes can be life-threatening. CAUSES  Antibiotics.  Diarrhea or vomiting.  Using laxatives too much, which can cause diarrhea.  Chronic kidney disease.  Water pills (diuretics).  Eating disorders (bulimia).  Low magnesium level.  Sweating a lot. SIGNS AND SYMPTOMS  Weakness.  Constipation.  Fatigue.  Muscle cramps.  Mental confusion.  Skipped heartbeats or irregular heartbeat (palpitations).  Tingling or numbness. DIAGNOSIS  Your health care provider can diagnose hypokalemia with blood tests. In addition to checking your potassium level, your health care provider may also check other lab tests. TREATMENT Hypokalemia can be treated with potassium supplements taken by mouth or adjustments in your current  medicines. If your potassium level is very low, you may need to get potassium through a vein (IV) and be monitored in the hospital. A diet high in potassium is also helpful. Foods high in potassium are:  Nuts, such as peanuts and pistachios.  Seeds, such as sunflower seeds and pumpkin seeds.  Peas, lentils, and lima beans.  Whole grain and bran cereals and breads.  Fresh fruit and vegetables, such as apricots, avocado, bananas, cantaloupe, kiwi, oranges, tomatoes, asparagus, and potatoes.  Orange and tomato juices.  Red meats.  Fruit yogurt. HOME CARE INSTRUCTIONS  Take all medicines as prescribed by your health care provider.  Maintain a healthy diet by including nutritious food, such as fruits, vegetables, nuts, whole grains, and lean meats.  If you are taking a laxative, be sure to follow the directions on the label. SEEK MEDICAL CARE IF:  Your weakness gets worse.  You feel your heart pounding or racing.  You are vomiting or having diarrhea.  You are diabetic and having trouble keeping your blood glucose in the normal range. SEEK IMMEDIATE MEDICAL CARE IF:  You have chest pain, shortness of breath, or dizziness.  You are vomiting or having diarrhea for more than 2 days.  You faint. MAKE SURE YOU:   Understand these instructions.  Will watch your condition.  Will get help right away if you are not doing well or get worse.   This information is not intended to replace advice given to you by your health care provider. Make sure you discuss any questions you have with your health care provider.   Document Released: 08/19/2005 Document Revised: 09/09/2014 Document Reviewed: 02/19/2013 Elsevier Interactive Patient Education [employer].

## 2016-06-06 ENCOUNTER — Encounter: Payer: Self-pay | Admitting: Internal Medicine

## 2016-06-06 ENCOUNTER — Ambulatory Visit (HOSPITAL_BASED_OUTPATIENT_CLINIC_OR_DEPARTMENT_OTHER): Payer: Medicare Other

## 2016-06-06 ENCOUNTER — Telehealth: Payer: Self-pay | Admitting: *Deleted

## 2016-06-06 VITALS — BP 139/89 | HR 102 | Temp 98.1°F | Resp 16

## 2016-06-06 DIAGNOSIS — C3491 Malignant neoplasm of unspecified part of right bronchus or lung: Secondary | ICD-10-CM

## 2016-06-06 DIAGNOSIS — C3411 Malignant neoplasm of upper lobe, right bronchus or lung: Secondary | ICD-10-CM | POA: Diagnosis not present

## 2016-06-06 DIAGNOSIS — Z5111 Encounter for antineoplastic chemotherapy: Secondary | ICD-10-CM

## 2016-06-06 MED ORDER — HEPARIN SOD (PORK) LOCK FLUSH 100 UNIT/ML IV SOLN
500.0000 [IU] | Freq: Once | INTRAVENOUS | Status: AC | PRN
  Administered 2016-06-06: 500 [IU]
  Filled 2016-06-06: qty 5

## 2016-06-06 MED ORDER — SODIUM CHLORIDE 0.9 % IV SOLN
Freq: Once | INTRAVENOUS | Status: AC
  Administered 2016-06-06: 15:00:00 via INTRAVENOUS

## 2016-06-06 MED ORDER — SODIUM CHLORIDE 0.9% FLUSH
10.0000 mL | INTRAVENOUS | Status: DC | PRN
  Administered 2016-06-06: 10 mL
  Filled 2016-06-06: qty 10

## 2016-06-06 MED ORDER — SODIUM CHLORIDE 0.9 % IV SOLN
100.0000 mg/m2 | Freq: Once | INTRAVENOUS | Status: AC
  Administered 2016-06-06: 160 mg via INTRAVENOUS
  Filled 2016-06-06: qty 8

## 2016-06-06 MED ORDER — SODIUM CHLORIDE 0.9 % IV SOLN
10.0000 mg | Freq: Once | INTRAVENOUS | Status: AC
  Administered 2016-06-06: 10 mg via INTRAVENOUS
  Filled 2016-06-06: qty 1

## 2016-06-06 NOTE — Patient Instructions (Signed)
[facility] Discharge Instructions for Patients Receiving Chemotherapy  Today you received the following chemotherapy agents etoposide   To help prevent nausea and vomiting after your treatment, we encourage you to take your nausea medication as directed  If you develop nausea and vomiting that is not controlled by your nausea medication, call the clinic.   BELOW ARE SYMPTOMS THAT SHOULD BE REPORTED IMMEDIATELY:  *FEVER GREATER THAN 100.5 F  *CHILLS WITH OR WITHOUT FEVER  NAUSEA AND VOMITING THAT IS NOT CONTROLLED WITH YOUR NAUSEA MEDICATION  *UNUSUAL SHORTNESS OF BREATH  *UNUSUAL BRUISING OR BLEEDING  TENDERNESS IN MOUTH AND THROAT WITH OR WITHOUT PRESENCE OF ULCERS  *URINARY PROBLEMS  *BOWEL PROBLEMS  UNUSUAL RASH Items with * indicate a potential emergency and should be followed up as soon as possible.  Feel free to call the clinic you have any questions or concerns. The clinic phone number is [phone].  

## 2016-06-06 NOTE — Telephone Encounter (Signed)
Oncology Nurse Navigator Documentation  Oncology Nurse Navigator Flowsheets 06/06/2016  Navigator Encounter Type Telephone/I followed up to see if patient's scan has been authorized.  I was authorized on 06/04/16 but not scheduled.  I called central scheduling and was given an appt time.  I called to update patient but was unable to reach her.    Telephone Outgoing Call  Treatment Phase Follow-up  Barriers/Navigation Needs Coordination of Care  Interventions Coordination of Care  Coordination of Care Appts  Acuity Level 2  Acuity Level 2 Assistance expediting appointments  Time Spent with Patient 30

## 2016-06-06 NOTE — Progress Notes (Signed)
Introduced myself as her FA.  Unfortunately there aren't any foundations offering copay assistance for her Dx.  I offered the [place], went over what it covers and gave her an expense sheet.  Pt would like to apply so she will bring her proof of income on her next visit.

## 2016-06-07 ENCOUNTER — Ambulatory Visit (HOSPITAL_BASED_OUTPATIENT_CLINIC_OR_DEPARTMENT_OTHER): Payer: Medicare Other

## 2016-06-07 VITALS — BP 151/91 | HR 103 | Temp 98.5°F | Resp 16

## 2016-06-07 DIAGNOSIS — C3411 Malignant neoplasm of upper lobe, right bronchus or lung: Secondary | ICD-10-CM | POA: Diagnosis not present

## 2016-06-07 DIAGNOSIS — Z5111 Encounter for antineoplastic chemotherapy: Secondary | ICD-10-CM

## 2016-06-07 DIAGNOSIS — C3491 Malignant neoplasm of unspecified part of right bronchus or lung: Secondary | ICD-10-CM

## 2016-06-07 MED ORDER — PEGFILGRASTIM 6 MG/0.6ML ~~LOC~~ PSKT
6.0000 mg | PREFILLED_SYRINGE | Freq: Once | SUBCUTANEOUS | Status: AC
  Administered 2016-06-07: 6 mg via SUBCUTANEOUS
  Filled 2016-06-07: qty 0.6

## 2016-06-07 MED ORDER — SODIUM CHLORIDE 0.9 % IV SOLN
10.0000 mg | Freq: Once | INTRAVENOUS | Status: AC
  Administered 2016-06-07: 10 mg via INTRAVENOUS
  Filled 2016-06-07: qty 1

## 2016-06-07 MED ORDER — SODIUM CHLORIDE 0.9 % IV SOLN
100.0000 mg/m2 | Freq: Once | INTRAVENOUS | Status: AC
  Administered 2016-06-07: 160 mg via INTRAVENOUS
  Filled 2016-06-07: qty 8

## 2016-06-07 MED ORDER — HEPARIN SOD (PORK) LOCK FLUSH 100 UNIT/ML IV SOLN
500.0000 [IU] | Freq: Once | INTRAVENOUS | Status: AC | PRN
  Administered 2016-06-07: 500 [IU]
  Filled 2016-06-07: qty 5

## 2016-06-07 MED ORDER — SODIUM CHLORIDE 0.9 % IV SOLN
Freq: Once | INTRAVENOUS | Status: AC
  Administered 2016-06-07: 13:00:00 via INTRAVENOUS

## 2016-06-07 MED ORDER — SODIUM CHLORIDE 0.9% FLUSH
10.0000 mL | INTRAVENOUS | Status: DC | PRN
  Administered 2016-06-07: 10 mL
  Filled 2016-06-07: qty 10

## 2016-06-07 NOTE — Patient Instructions (Signed)
[facility] Discharge Instructions for Patients Receiving Chemotherapy  Today you received the following chemotherapy agents VP 16.  To help prevent nausea and vomiting after your treatment, we encourage you to take your nausea medication as prescribed.  If you develop nausea and vomiting that is not controlled by your nausea medication, call the clinic.   BELOW ARE SYMPTOMS THAT SHOULD BE REPORTED IMMEDIATELY:  *FEVER GREATER THAN 100.5 F  *CHILLS WITH OR WITHOUT FEVER  NAUSEA AND VOMITING THAT IS NOT CONTROLLED WITH YOUR NAUSEA MEDICATION  *UNUSUAL SHORTNESS OF BREATH  *UNUSUAL BRUISING OR BLEEDING  TENDERNESS IN MOUTH AND THROAT WITH OR WITHOUT PRESENCE OF ULCERS  *URINARY PROBLEMS  *BOWEL PROBLEMS  UNUSUAL RASH Items with * indicate a potential emergency and should be followed up as soon as possible.  Feel free to call the clinic you have any questions or concerns. The clinic phone number is [phone].  Please show the [facility] at check-in to the Emergency Department and triage nurse.

## 2016-06-08 ENCOUNTER — Ambulatory Visit: Payer: Medicare Other

## 2016-06-10 ENCOUNTER — Telehealth: Payer: Self-pay | Admitting: *Deleted

## 2016-06-10 NOTE — Telephone Encounter (Signed)
Oncology Nurse Navigator Documentation  Oncology Nurse Navigator Flowsheets 06/10/2016  Navigator Location [facility]  Navigator Encounter Type Telephone/I followed up with Dr. Julien Nordmann to see if patient needed PET scan.  He stated yes.  I called central scheduling and was given an appt.  I called patient to update her on appt and pre procedure instructions.  She verbalized understanding of appt time and place.   Telephone Outgoing Call  Treatment Phase Follow-up  Barriers/Navigation Needs Coordination of Care  Interventions Coordination of Care  Coordination of Care Appts;Radiology  Acuity Level 2  Acuity Level 2 Other  Time Spent with Patient 30

## 2016-06-14 ENCOUNTER — Ambulatory Visit (HOSPITAL_COMMUNITY)
Admission: RE | Admit: 2016-06-14 | Discharge: 2016-06-14 | Disposition: A | Discharge status: Home / Self Care | Payer: Medicare Other | Source: Ambulatory Visit | Attending: Internal Medicine | Admitting: Internal Medicine

## 2016-06-14 ENCOUNTER — Encounter: Payer: Self-pay | Admitting: *Deleted

## 2016-06-14 DIAGNOSIS — C3491 Malignant neoplasm of unspecified part of right bronchus or lung: Secondary | ICD-10-CM | POA: Diagnosis not present

## 2016-06-14 DIAGNOSIS — Z23 Encounter for immunization: Secondary | ICD-10-CM | POA: Diagnosis not present

## 2016-06-14 MED ORDER — GADOBENATE DIMEGLUMINE 529 MG/ML IV SOLN
11.0000 mL | Freq: Once | INTRAVENOUS | Status: AC | PRN
  Administered 2016-06-14: 11 mL via INTRAVENOUS

## 2016-06-18 ENCOUNTER — Other Ambulatory Visit: Payer: Self-pay | Admitting: *Deleted

## 2016-06-18 ENCOUNTER — Ambulatory Visit (HOSPITAL_COMMUNITY): Payer: Medicare Other

## 2016-06-18 DIAGNOSIS — C3491 Malignant neoplasm of unspecified part of right bronchus or lung: Secondary | ICD-10-CM

## 2016-06-19 ENCOUNTER — Encounter: Payer: Self-pay | Admitting: Internal Medicine

## 2016-06-19 ENCOUNTER — Other Ambulatory Visit: Payer: Self-pay | Admitting: *Deleted

## 2016-06-19 ENCOUNTER — Other Ambulatory Visit (HOSPITAL_BASED_OUTPATIENT_CLINIC_OR_DEPARTMENT_OTHER): Payer: Medicare Other

## 2016-06-19 DIAGNOSIS — C3491 Malignant neoplasm of unspecified part of right bronchus or lung: Secondary | ICD-10-CM

## 2016-06-19 DIAGNOSIS — C3411 Malignant neoplasm of upper lobe, right bronchus or lung: Secondary | ICD-10-CM | POA: Diagnosis not present

## 2016-06-19 LAB — COMPREHENSIVE METABOLIC PANEL
ALBUMIN: 3 g/dL — AB (ref 3.5–5.0)
ALK PHOS: 177 U/L — AB (ref 40–150)
ALT: 72 U/L — ABNORMAL HIGH (ref 0–55)
AST: 43 U/L — AB (ref 5–34)
Anion Gap: 11 mEq/L (ref 3–11)
BILIRUBIN TOTAL: 0.23 mg/dL (ref 0.20–1.20)
BUN: 5.1 mg/dL — AB (ref 7.0–26.0)
CO2: 29 mEq/L (ref 22–29)
Calcium: 8.7 mg/dL (ref 8.4–10.4)
Chloride: 97 mEq/L — ABNORMAL LOW (ref 98–109)
Creatinine: 0.8 mg/dL (ref 0.6–1.1)
EGFR: 77 mL/min/{1.73_m2} — ABNORMAL LOW (ref >90)
GLUCOSE: 91 mg/dL (ref 70–140)
POTASSIUM: 2.7 meq/L — AB (ref 3.5–5.1)
SODIUM: 137 meq/L (ref 136–145)
TOTAL PROTEIN: 6.6 g/dL (ref 6.4–8.3)

## 2016-06-19 LAB — CBC WITH DIFFERENTIAL/PLATELET
BASO%: 0.2 % (ref 0.0–2.0)
Basophils Absolute: 0.1 10*3/uL (ref 0.0–0.1)
EOS%: 0.2 % (ref 0.0–7.0)
Eosinophils Absolute: 0 10*3/uL (ref 0.0–0.5)
HCT: 35.1 % (ref 34.8–46.6)
HEMOGLOBIN: 11.7 g/dL (ref 11.6–15.9)
LYMPH%: 13.5 % — ABNORMAL LOW (ref 14.0–49.7)
MCH: 32.8 pg (ref 25.1–34.0)
MCHC: 33.3 g/dL (ref 31.5–36.0)
MCV: 98.6 fL (ref 79.5–101.0)
MONO#: 3.3 10*3/uL — ABNORMAL HIGH (ref 0.1–0.9)
MONO%: 13.4 % (ref 0.0–14.0)
NEUT%: 72.7 % (ref 38.4–76.8)
NEUTROS ABS: 18 10*3/uL — AB (ref 1.5–6.5)
Platelets: 50 10*3/uL — ABNORMAL LOW (ref 145–400)
RBC: 3.56 10*6/uL — ABNORMAL LOW (ref 3.70–5.45)
RDW: 14.2 % (ref 11.2–14.5)
WBC: 24.8 10*3/uL — AB (ref 3.9–10.3)
lymph#: 3.4 10*3/uL — ABNORMAL HIGH (ref 0.9–3.3)

## 2016-06-19 MED ORDER — POTASSIUM CHLORIDE ER 20 MEQ PO TBCR: 40.0000 meq | EXTENDED_RELEASE_TABLET | Freq: Every day | ORAL | 0 refills | Status: DC

## 2016-06-19 NOTE — Progress Notes (Signed)
Pt is approved for the $400 [facility] grant.  °

## 2016-06-25 ENCOUNTER — Other Ambulatory Visit: Payer: Self-pay | Admitting: Medical Oncology

## 2016-06-25 ENCOUNTER — Encounter: Payer: Self-pay | Admitting: Pharmacist

## 2016-06-25 DIAGNOSIS — C3491 Malignant neoplasm of unspecified part of right bronchus or lung: Secondary | ICD-10-CM

## 2016-06-26 ENCOUNTER — Encounter: Payer: Self-pay | Admitting: *Deleted

## 2016-06-26 ENCOUNTER — Ambulatory Visit (HOSPITAL_BASED_OUTPATIENT_CLINIC_OR_DEPARTMENT_OTHER): Payer: Medicare Other

## 2016-06-26 ENCOUNTER — Other Ambulatory Visit (HOSPITAL_BASED_OUTPATIENT_CLINIC_OR_DEPARTMENT_OTHER): Payer: Medicare Other

## 2016-06-26 ENCOUNTER — Ambulatory Visit (HOSPITAL_BASED_OUTPATIENT_CLINIC_OR_DEPARTMENT_OTHER): Payer: Medicare Other | Admitting: Nurse Practitioner

## 2016-06-26 VITALS — BP 121/73 | HR 98 | Temp 97.6°F | Resp 20

## 2016-06-26 DIAGNOSIS — Z5111 Encounter for antineoplastic chemotherapy: Secondary | ICD-10-CM | POA: Diagnosis present

## 2016-06-26 DIAGNOSIS — C3411 Malignant neoplasm of upper lobe, right bronchus or lung: Secondary | ICD-10-CM

## 2016-06-26 DIAGNOSIS — E876 Hypokalemia: Secondary | ICD-10-CM | POA: Diagnosis not present

## 2016-06-26 DIAGNOSIS — T7840XA Allergy, unspecified, initial encounter: Secondary | ICD-10-CM | POA: Diagnosis not present

## 2016-06-26 DIAGNOSIS — C3491 Malignant neoplasm of unspecified part of right bronchus or lung: Secondary | ICD-10-CM

## 2016-06-26 DIAGNOSIS — Z23 Encounter for immunization: Secondary | ICD-10-CM

## 2016-06-26 LAB — COMPREHENSIVE METABOLIC PANEL
ALBUMIN: 2.7 g/dL — AB (ref 3.5–5.0)
ALT: 21 U/L (ref 0–55)
AST: 13 U/L (ref 5–34)
Alkaline Phosphatase: 183 U/L — ABNORMAL HIGH (ref 40–150)
Anion Gap: 10 mEq/L (ref 3–11)
BUN: 6.3 mg/dL — AB (ref 7.0–26.0)
CO2: 25 meq/L (ref 22–29)
Calcium: 9.2 mg/dL (ref 8.4–10.4)
Chloride: 101 mEq/L (ref 98–109)
Creatinine: 0.8 mg/dL (ref 0.6–1.1)
EGFR: 83 mL/min/{1.73_m2} — AB (ref >90)
GLUCOSE: 99 mg/dL (ref 70–140)
POTASSIUM: 3.7 meq/L (ref 3.5–5.1)
SODIUM: 136 meq/L (ref 136–145)
TOTAL PROTEIN: 7.3 g/dL (ref 6.4–8.3)
Total Bilirubin: 0.25 mg/dL (ref 0.20–1.20)

## 2016-06-26 LAB — CBC WITH DIFFERENTIAL/PLATELET
BASO%: 0.3 % (ref 0.0–2.0)
BASOS ABS: 0.1 10*3/uL (ref 0.0–0.1)
EOS%: 0 % (ref 0.0–7.0)
Eosinophils Absolute: 0 10*3/uL (ref 0.0–0.5)
HCT: 31.3 % — ABNORMAL LOW (ref 34.8–46.6)
HEMOGLOBIN: 10.6 g/dL — AB (ref 11.6–15.9)
LYMPH#: 2.7 10*3/uL (ref 0.9–3.3)
LYMPH%: 13.6 % — ABNORMAL LOW (ref 14.0–49.7)
MCH: 33.1 pg (ref 25.1–34.0)
MCHC: 33.9 g/dL (ref 31.5–36.0)
MCV: 97.8 fL (ref 79.5–101.0)
MONO#: 2.4 10*3/uL — ABNORMAL HIGH (ref 0.1–0.9)
MONO%: 11.8 % (ref 0.0–14.0)
NEUT#: 14.9 10*3/uL — ABNORMAL HIGH (ref 1.5–6.5)
NEUT%: 74.3 % (ref 38.4–76.8)
NRBC: 0 % (ref 0–0)
Platelets: 1114 10*3/uL — ABNORMAL HIGH (ref 145–400)
RBC: 3.2 10*6/uL — AB (ref 3.70–5.45)
RDW: 13.9 % (ref 11.2–14.5)
WBC: 20 10*3/uL — ABNORMAL HIGH (ref 3.9–10.3)

## 2016-06-26 MED ORDER — SODIUM CHLORIDE 0.9 % IV SOLN
Freq: Once | INTRAVENOUS | Status: AC
  Administered 2016-06-26: 13:00:00 via INTRAVENOUS

## 2016-06-26 MED ORDER — SODIUM CHLORIDE 0.9% FLUSH
10.0000 mL | INTRAVENOUS | Status: DC | PRN
  Administered 2016-06-26: 10 mL
  Filled 2016-06-26: qty 10

## 2016-06-26 MED ORDER — FAMOTIDINE IN NACL 20-0.9 MG/50ML-% IV SOLN
20.0000 mg | Freq: Once | INTRAVENOUS | Status: AC
  Administered 2016-06-26: 20 mg via INTRAVENOUS

## 2016-06-26 MED ORDER — HEPARIN SOD (PORK) LOCK FLUSH 100 UNIT/ML IV SOLN
500.0000 [IU] | Freq: Once | INTRAVENOUS | Status: AC | PRN
  Administered 2016-06-26: 500 [IU]
  Filled 2016-06-26: qty 5

## 2016-06-26 MED ORDER — PROCHLORPERAZINE MALEATE 10 MG PO TABS: 10.0000 mg | ORAL_TABLET | Freq: Four times a day (QID) | ORAL | 0 refills | Status: DC | PRN

## 2016-06-26 MED ORDER — SODIUM CHLORIDE 0.9 % IV SOLN
466.0000 mg | Freq: Once | INTRAVENOUS | Status: AC
  Administered 2016-06-26: 470 mg via INTRAVENOUS
  Filled 2016-06-26: qty 47

## 2016-06-26 MED ORDER — SODIUM CHLORIDE 0.9 % IV SOLN
100.0000 mg/m2 | Freq: Once | INTRAVENOUS | Status: AC
  Administered 2016-06-26: 160 mg via INTRAVENOUS
  Filled 2016-06-26: qty 8

## 2016-06-26 MED ORDER — PALONOSETRON HCL INJECTION 0.25 MG/5ML
0.2500 mg | Freq: Once | INTRAVENOUS | Status: AC
  Administered 2016-06-26: 0.25 mg via INTRAVENOUS

## 2016-06-26 MED ORDER — METHYLPREDNISOLONE SODIUM SUCC 125 MG IJ SOLR
125.0000 mg | Freq: Once | INTRAMUSCULAR | Status: AC
  Administered 2016-06-26: 125 mg via INTRAVENOUS

## 2016-06-26 MED ORDER — DEXAMETHASONE SODIUM PHOSPHATE 10 MG/ML IJ SOLN
10.0000 mg | Freq: Once | INTRAMUSCULAR | Status: AC
  Administered 2016-06-26: 10 mg via INTRAVENOUS

## 2016-06-26 MED ORDER — DEXAMETHASONE SODIUM PHOSPHATE 10 MG/ML IJ SOLN
INTRAMUSCULAR | Status: AC
  Filled 2016-06-26: qty 1

## 2016-06-26 MED ORDER — PALONOSETRON HCL INJECTION 0.25 MG/5ML
INTRAVENOUS | Status: AC
  Filled 2016-06-26: qty 5

## 2016-06-26 MED ORDER — DIPHENHYDRAMINE HCL 50 MG/ML IJ SOLN
50.0000 mg | Freq: Once | INTRAMUSCULAR | Status: AC
  Administered 2016-06-26: 50 mg via INTRAVENOUS

## 2016-06-26 MED FILL — PROCHLORPERAZINE 10 MG TAB: 10 | 7 days supply | Qty: 30 | Fill #0

## 2016-06-26 NOTE — Progress Notes (Signed)
HR 106: reviewed CBC(Dr. Mohamed aware and no new orders at this time) and HR with Ned Card NP. Per Lattie Haw NP okay to proceed with treatment, pt to receive Neulasta as scheduled on 06/28/16.   1519: At 1455 patient started complaining of shortness of breathe, feeling hot and was flushed. Etoposide stopped, normal saline started wide open and Selena Lesser, NP aware. Patient received IV Benadryl '50mg'$ , Solu-medrol '125mg'$  IV and Pepcid '20mg'$  IV. After medications received at approximately 1458 patient began complaining of 7/10 headache. Vital signs stable throughout. By 7618 all symptoms subsided and patient in stable condition. 1515 Etoposide restarted per Selena Lesser, NP.    Patient educated and provided copy of hypersensitivity protocol medications, per Selena Lesser, NP. Patient and patient's spouse verbalized understanding.

## 2016-06-26 NOTE — Progress Notes (Addendum)
  [facility] OFFICE PROGRESS NOTE   Diagnosis:  Extensive stage (T2a, N3, M1b) small cell lung cancer diagnosed in September 2017 presenting with right upper lobe lung mass in addition to large right anterior mediastinal and supraclavicular lymphadenopathy as well as pancreatic and questionably splenic metastatic lesions.  INTERVAL HISTORY:   Debra Kidd returns as scheduled. She completed cycle 1 carboplatin/etoposide beginning 06/05/2016. She denies nausea/vomiting. No mouth sores. No diarrhea. No rash. She reports a good appetite. Stable dyspnea on exertion. Occasional cough. No fever.  Objective:  Vital signs in last 24 hours:  Blood pressure 112/77, pulse (!) 105, temperature 98.1 F (36.7 C), temperature source Oral, resp. rate 18, height '5\' 3"'$  (1.6 m), weight 120 lb 9.6 oz (54.7 kg), SpO2 100 %.    HEENT: No thrush or ulcers. Resp: Lungs clear bilaterally. Cardio: Regular rate and rhythm. GI: Abdomen soft and nontender. No hepatomegaly. Vascular: No leg edema. Port-A-Cath without erythema.  Lab Results:  Lab Results  Component Value Date   WBC 20.0 (H) 06/26/2016   HGB 10.6 (L) 06/26/2016   HCT 31.3 (L) 06/26/2016   MCV 97.8 06/26/2016   PLT 1,114 (H) 06/26/2016   NEUTROABS 14.9 (H) 06/26/2016    Imaging:  No results found.  Medications: I have reviewed the patient's current medications.  Assessment/Plan: 1. Extensive stage (T2a, N3, M1b) small cell lung cancer diagnosed in September 2017 presenting with right upper lobe lung mass in addition to large right anterior mediastinal and supraclavicular lymphadenopathy as well as pancreatic and questionably splenic metastatic lesions; cycle 1 carboplatin/etoposide beginning 06/05/2016. 2. Port-A-Cath placement 06/03/2016   Disposition: Debra Kidd appears stable. She has completed 1 cycle of carboplatin/etoposide. Plan to proceed with cycle 2 today as scheduled. Dr. Julien Nordmann recommends restaging CT scans  prior to cycle 3. She will return for a follow-up visit and cycle 3 carboplatin/etoposide in 3 weeks. She will contact the office in the interim with any problems.  We will contact the research department to see if she may be a candidate for the maintenance small cell clinical trial.  Patient seen with Dr. Julien Nordmann.    Ned Card ANP/GNP-BC   06/26/2016  11:48 AM  ADDENDUM: Hematology/Oncology Attending: I had a face to face encounter with the patient today. I recommended her care plan. This is a very pleasant 55 years old white female recently diagnosed with extensive stage small cell lung cancer. She is currently undergoing systemic chemotherapy with carboplatin and etoposide status post 1 cycle. She tolerated the first cycle of her treatment well with no significant adverse effects. She has no fever or chills. She has no nausea or vomiting. I recommended for the patient to proceed with cycle #2 today as a scheduled. For the hypokalemia, she will continue on potassium chloride supplements for now. The patient would come back for follow-up visit in 3 weeks for reevaluation with repeat CT scan of the chest, abdomen and pelvis for restaging of her disease. She was advised to call immediately if she has any concerning symptoms in the interval.  Disclaimer: This note was dictated with voice recognition software. Similar sounding words can inadvertently be transcribed and may be missed upon review. Eilleen Kempf., MD 06/26/16

## 2016-06-26 NOTE — Patient Instructions (Signed)
[facility] Discharge Instructions for Patients Receiving Chemotherapy  Today you received the following chemotherapy agents: Etoposide and Carboplatin   To help prevent nausea and vomiting after your treatment, we encourage you to take your nausea medication as directed.    If you develop nausea and vomiting that is not controlled by your nausea medication, call the clinic.   BELOW ARE SYMPTOMS THAT SHOULD BE REPORTED IMMEDIATELY:  *FEVER GREATER THAN 100.5 F  *CHILLS WITH OR WITHOUT FEVER  NAUSEA AND VOMITING THAT IS NOT CONTROLLED WITH YOUR NAUSEA MEDICATION  *UNUSUAL SHORTNESS OF BREATH  *UNUSUAL BRUISING OR BLEEDING  TENDERNESS IN MOUTH AND THROAT WITH OR WITHOUT PRESENCE OF ULCERS  *URINARY PROBLEMS  *BOWEL PROBLEMS  UNUSUAL RASH Items with * indicate a potential emergency and should be followed up as soon as possible.  Feel free to call the clinic you have any questions or concerns. The clinic phone number is [phone].  Please show the CHEMO ALERT CARD at check-in to the Emergency Department and triage nurse.  

## 2016-06-26 NOTE — Progress Notes (Signed)
Pt tolerated remainder of treatment with VP-16. Verbalized understanding of sensitivity reaction handout, medications and times. Pt stable at time of discharge.

## 2016-06-27 ENCOUNTER — Ambulatory Visit: Payer: Medicare Other | Admitting: Internal Medicine

## 2016-06-27 ENCOUNTER — Other Ambulatory Visit: Payer: Medicare Other

## 2016-06-27 ENCOUNTER — Ambulatory Visit (HOSPITAL_BASED_OUTPATIENT_CLINIC_OR_DEPARTMENT_OTHER): Payer: Medicare Other

## 2016-06-27 VITALS — BP 117/72 | HR 102 | Temp 98.4°F | Resp 17

## 2016-06-27 DIAGNOSIS — Z5111 Encounter for antineoplastic chemotherapy: Secondary | ICD-10-CM

## 2016-06-27 DIAGNOSIS — C3411 Malignant neoplasm of upper lobe, right bronchus or lung: Secondary | ICD-10-CM | POA: Diagnosis not present

## 2016-06-27 DIAGNOSIS — C3491 Malignant neoplasm of unspecified part of right bronchus or lung: Secondary | ICD-10-CM

## 2016-06-27 MED ORDER — SODIUM CHLORIDE 0.9% FLUSH
10.0000 mL | INTRAVENOUS | Status: DC | PRN
  Administered 2016-06-27: 10 mL
  Filled 2016-06-27: qty 10

## 2016-06-27 MED ORDER — DEXAMETHASONE SODIUM PHOSPHATE 10 MG/ML IJ SOLN
INTRAMUSCULAR | Status: AC
  Filled 2016-06-27: qty 1

## 2016-06-27 MED ORDER — SODIUM CHLORIDE 0.9 % IV SOLN
100.0000 mg/m2 | Freq: Once | INTRAVENOUS | Status: AC
  Administered 2016-06-27: 160 mg via INTRAVENOUS
  Filled 2016-06-27: qty 8

## 2016-06-27 MED ORDER — DIPHENHYDRAMINE HCL 50 MG/ML IJ SOLN
INTRAMUSCULAR | Status: AC
  Filled 2016-06-27: qty 1

## 2016-06-27 MED ORDER — DIPHENHYDRAMINE HCL 50 MG/ML IJ SOLN
25.0000 mg | Freq: Once | INTRAMUSCULAR | Status: AC
  Administered 2016-06-27: 25 mg via INTRAVENOUS

## 2016-06-27 MED ORDER — HEPARIN SOD (PORK) LOCK FLUSH 100 UNIT/ML IV SOLN
500.0000 [IU] | Freq: Once | INTRAVENOUS | Status: AC | PRN
  Administered 2016-06-27: 500 [IU]
  Filled 2016-06-27: qty 5

## 2016-06-27 MED ORDER — DEXAMETHASONE SODIUM PHOSPHATE 10 MG/ML IJ SOLN
10.0000 mg | Freq: Once | INTRAMUSCULAR | Status: AC
  Administered 2016-06-27: 10 mg via INTRAVENOUS

## 2016-06-27 MED ORDER — SODIUM CHLORIDE 0.9 % IV SOLN
Freq: Once | INTRAVENOUS | Status: AC
  Administered 2016-06-27: 14:00:00 via INTRAVENOUS

## 2016-06-27 NOTE — Patient Instructions (Signed)
[facility] Discharge Instructions for Patients Receiving Chemotherapy  Today you received the following chemotherapy agents Etoposide.   To help prevent nausea and vomiting after your treatment, we encourage you to take your nausea medication as prescribed.   If you develop nausea and vomiting that is not controlled by your nausea medication, call the clinic.   BELOW ARE SYMPTOMS THAT SHOULD BE REPORTED IMMEDIATELY:  *FEVER GREATER THAN 100.5 F  *CHILLS WITH OR WITHOUT FEVER  NAUSEA AND VOMITING THAT IS NOT CONTROLLED WITH YOUR NAUSEA MEDICATION  *UNUSUAL SHORTNESS OF BREATH  *UNUSUAL BRUISING OR BLEEDING  TENDERNESS IN MOUTH AND THROAT WITH OR WITHOUT PRESENCE OF ULCERS  *URINARY PROBLEMS  *BOWEL PROBLEMS  UNUSUAL RASH Items with * indicate a potential emergency and should be followed up as soon as possible.  Feel free to call the clinic you have any questions or concerns. The clinic phone number is [phone].  Please show the CHEMO ALERT CARD at check-in to the Emergency Department and triage nurse.   

## 2016-06-28 ENCOUNTER — Ambulatory Visit (HOSPITAL_BASED_OUTPATIENT_CLINIC_OR_DEPARTMENT_OTHER): Payer: Medicare Other

## 2016-06-28 ENCOUNTER — Encounter: Payer: Self-pay | Admitting: Nurse Practitioner

## 2016-06-28 VITALS — BP 122/90 | HR 88 | Temp 98.6°F | Resp 18

## 2016-06-28 DIAGNOSIS — T7840XA Allergy, unspecified, initial encounter: Secondary | ICD-10-CM | POA: Insufficient documentation

## 2016-06-28 DIAGNOSIS — C3411 Malignant neoplasm of upper lobe, right bronchus or lung: Secondary | ICD-10-CM

## 2016-06-28 DIAGNOSIS — C3491 Malignant neoplasm of unspecified part of right bronchus or lung: Secondary | ICD-10-CM

## 2016-06-28 DIAGNOSIS — Z5111 Encounter for antineoplastic chemotherapy: Secondary | ICD-10-CM

## 2016-06-28 MED ORDER — HEPARIN SOD (PORK) LOCK FLUSH 100 UNIT/ML IV SOLN
500.0000 [IU] | Freq: Once | INTRAVENOUS | Status: AC | PRN
  Administered 2016-06-28: 500 [IU]
  Filled 2016-06-28: qty 5

## 2016-06-28 MED ORDER — SODIUM CHLORIDE 0.9% FLUSH
10.0000 mL | INTRAVENOUS | Status: DC | PRN
  Administered 2016-06-28: 10 mL
  Filled 2016-06-28: qty 10

## 2016-06-28 MED ORDER — DIPHENHYDRAMINE HCL 50 MG/ML IJ SOLN
25.0000 mg | Freq: Once | INTRAMUSCULAR | Status: AC
  Administered 2016-06-28: 25 mg via INTRAVENOUS

## 2016-06-28 MED ORDER — SODIUM CHLORIDE 0.9 % IV SOLN
100.0000 mg/m2 | Freq: Once | INTRAVENOUS | Status: AC
  Administered 2016-06-28: 160 mg via INTRAVENOUS
  Filled 2016-06-28: qty 8

## 2016-06-28 MED ORDER — DEXAMETHASONE SODIUM PHOSPHATE 10 MG/ML IJ SOLN
10.0000 mg | Freq: Once | INTRAMUSCULAR | Status: AC
  Administered 2016-06-28: 10 mg via INTRAVENOUS

## 2016-06-28 MED ORDER — PEGFILGRASTIM 6 MG/0.6ML ~~LOC~~ PSKT
6.0000 mg | PREFILLED_SYRINGE | Freq: Once | SUBCUTANEOUS | Status: AC
  Administered 2016-06-28: 6 mg via SUBCUTANEOUS
  Filled 2016-06-28: qty 0.6

## 2016-06-28 MED ORDER — DEXAMETHASONE SODIUM PHOSPHATE 10 MG/ML IJ SOLN
INTRAMUSCULAR | Status: AC
  Filled 2016-06-28: qty 1

## 2016-06-28 MED ORDER — DIPHENHYDRAMINE HCL 50 MG/ML IJ SOLN
INTRAMUSCULAR | Status: AC
Start: 2016-06-28 — End: 2016-06-28
  Filled 2016-06-28: qty 1

## 2016-06-28 MED ORDER — SODIUM CHLORIDE 0.9 % IV SOLN
Freq: Once | INTRAVENOUS | Status: AC
  Administered 2016-06-28: 13:00:00 via INTRAVENOUS

## 2016-06-28 NOTE — Progress Notes (Signed)
SYMPTOM MANAGEMENT CLINIC    Chief Complaint: Hypersensitivity reaction  HPI:  Debra Kidd 55 y.o. female diagnosed with lung cancer.  Currently undergoing carboplatin/etoposide chemotherapy regimen.    No history exists.    Review of Systems  Skin: Positive for rash.  All other systems reviewed and are negative.   Past Medical History:  Diagnosis Date  . 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 Blood type, Rh positive   . Cancer ([place])   . Chronic pain syndrome   . Hyperlipidemia   . Hypertension   . Osteoporosis     Past Surgical History:  Procedure Laterality Date  . ABDOMINAL HYSTERECTOMY  1981  . EYE SURGERY     left  . IR GENERIC HISTORICAL  06/03/2016   IR FLUORO GUIDE PORT INSERTION RIGHT 06/03/2016 [facility]  . IR GENERIC HISTORICAL  06/03/2016   IR US GUIDE VASC ACCESS RIGHT 06/03/2016 [facility]  . 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    is allergic to codeine; motrin [ibuprofen]; thiazide-type diuretics; and vicodin [hydrocodone-acetaminophen].    Medication List       Accurate as of 06/26/16 11:59 PM. Always use your most recent med list.          calcium carbonate 500 MG chewable tablet Commonly known as:  TUMS - dosed in mg elemental calcium Chew 1 tablet by mouth once a week.   lidocaine-prilocaine cream Commonly known as:  EMLA Apply 1 application topically as needed.   LORazepam 1 MG tablet Commonly known as:  ATIVAN Take 1 mg by mouth 2 (two) times daily.   losartan 50 MG tablet Commonly known as:  COZAAR Take 50 mg by mouth daily.   oxyCODONE-acetaminophen 5-325 MG tablet Commonly known as:  PERCOCET/ROXICET Take 0.5 tablets by mouth every 4 (four) hours as needed for severe pain.   Potassium Chloride ER 20 MEQ Tbcr Take 40 mEq by mouth daily. 2 tablets 20 mg each  every day for 10 days.   prochlorperazine 10 MG tablet Commonly known as:  COMPAZINE Take 1 tablet (10 mg total) by mouth every 6 (six) hours as needed for nausea or vomiting.   traMADol 50 MG tablet Commonly known as:  ULTRAM Take by mouth every 6 (six) hours as needed.        PHYSICAL EXAMINATION  Oncology Vitals 06/28/2016 06/27/2016  Height - -  Weight - -  Weight (lbs) - -  BMI (kg/m2) - -  Temp 98.6 98.4  Pulse 88 102  Resp 18 17  SpO2 100 100  BSA (m2) - -   BP Readings from Last 2 Encounters:  06/28/16 122/90  06/27/16 117/72    Physical Exam  Constitutional: She is oriented to person, place, and time and well-developed, well-nourished, and in no distress.  HENT:  Head: Normocephalic and atraumatic.  Mouth/Throat: Oropharynx is clear and moist.  Eyes: Conjunctivae and EOM are normal. Pupils are equal, round, and reactive to light. Right eye exhibits no discharge. Left eye exhibits no discharge. No scleral icterus.  Neck: Normal range of motion. Neck supple. No JVD present. No tracheal deviation present. No thyromegaly present.  Cardiovascular: Normal rate, regular rhythm, normal heart sounds and intact distal pulses.   Pulmonary/Chest: Effort normal and breath sounds normal. No respiratory distress.  She has no wheezes. She has no rales. She exhibits no tenderness.  Abdominal: Soft. Bowel sounds are normal. She exhibits no distension and no mass. There is no tenderness. There is no rebound and no guarding.  Musculoskeletal: Normal range of motion. She exhibits no edema or tenderness.  Lymphadenopathy:    She has no cervical adenopathy.  Neurological: She is alert and oriented to person, place, and time. Gait normal.  Skin: Skin is warm and dry. No rash noted. No erythema. No pallor.  Psychiatric: Affect normal.  Nursing note and vitals reviewed.   LABORATORY DATA:. Appointment on 06/26/2016  Component Date Value Ref Range Status  . WBC 06/26/2016 20.0* 3.9  - 10.3 10e3/uL Final  . NEUT# 06/26/2016 14.9* 1.5 - 6.5 10e3/uL Final  . HGB 06/26/2016 10.6* 11.6 - 15.9 g/dL Final  . HCT 06/26/2016 31.3* 34.8 - 46.6 % Final  . Platelets 06/26/2016 1,114* 145 - 400 10e3/uL Final  . MCV 06/26/2016 97.8  79.5 - 101.0 fL Final  . MCH 06/26/2016 33.1  25.1 - 34.0 pg Final  . MCHC 06/26/2016 33.9  31.5 - 36.0 g/dL Final  . RBC 06/26/2016 3.20* 3.70 - 5.45 10e6/uL Final  . RDW 06/26/2016 13.9  11.2 - 14.5 % Final  . lymph# 06/26/2016 2.7  0.9 - 3.3 10e3/uL Final  . MONO# 06/26/2016 2.4* 0.1 - 0.9 10e3/uL Final  . Eosinophils Absolute 06/26/2016 0.0  0.0 - 0.5 10e3/uL Final  . Basophils Absolute 06/26/2016 0.1  0.0 - 0.1 10e3/uL Final  . NEUT% 06/26/2016 74.3  38.4 - 76.8 % Final  . LYMPH% 06/26/2016 13.6* 14.0 - 49.7 % Final  . MONO% 06/26/2016 11.8  0.0 - 14.0 % Final  . EOS% 06/26/2016 0.0  0.0 - 7.0 % Final  . BASO% 06/26/2016 0.3  0.0 - 2.0 % Final  . nRBC 06/26/2016 0  0 - 0 % Final  . Sodium 06/26/2016 136  136 - 145 mEq/L Final  . Potassium 06/26/2016 3.7  3.5 - 5.1 mEq/L Final  . Chloride 06/26/2016 101  98 - 109 mEq/L Final  . CO2 06/26/2016 25  22 - 29 mEq/L Final  . Glucose 06/26/2016 99  70 - 140 mg/dl Final  . BUN 06/26/2016 6.3* 7.0 - 26.0 mg/dL Final  . Creatinine 06/26/2016 0.8  0.6 - 1.1 mg/dL Final  . Total Bilirubin 06/26/2016 0.25  0.20 - 1.20 mg/dL Final  . Alkaline Phosphatase 06/26/2016 183* 40 - 150 U/L Final  . AST 06/26/2016 13  5 - 34 U/L Final  . ALT 06/26/2016 21  0 - 55 U/L Final  . Total Protein 06/26/2016 7.3  6.4 - 8.3 g/dL Final  . Albumin 06/26/2016 2.7* 3.5 - 5.0 g/dL Final  . Calcium 06/26/2016 9.2  8.4 - 10.4 mg/dL Final  . Anion Gap 06/26/2016 10  3 - 11 mEq/L Final  . EGFR 06/26/2016 83* >90 ml/min/1.73 m2 Final    RADIOGRAPHIC STUDIES: No results found.  ASSESSMENT/PLAN:    Small cell carcinoma of right lung [facility]) Patient presented to the [facility] today.  Receive cycle 2, day 1 of her  carboplatin/etoposide chemotherapy regimen.  She developed a hypersensitivity reaction to the etoposide portion of her chemotherapy today.  See further notes for details.  Patient is scheduled to return both tomorrow and Friday, 06/28/2016 for days #2 and 3 of her chemotherapy regimen.  Also, patient will return on 07/17/2016 for labs, visit, and chemotherapy.  She will also continue with weekly labs.  Hypersensitivity reaction Patient presented to the [facility] today.  Receive cycle 2, day 1 of her carboplatin/etoposide chemotherapy regimen.  She developed a hypersensitivity reaction to the etoposide portion of her chemotherapy today.  Patient felt flushed and hot; and also experience a mild rash.  The etoposide infusion was held; and patient received Pepcid, Benadryl, and Solu-Medrol per hypersensitivity protocol.  All symptoms resolved; and patient was able to complete all of her chemotherapy with no further issues.  Patient was given both verbal and printed educational materials regarding the use of both Benadryl and Pepcid for any mild reaction symptoms.  She was encouraged critically to the emergency department for any worsening symptoms whatsoever.   Patient stated understanding of all instructions; and was in agreement with this plan of care. The patient knows to call the clinic with any problems, questions or concerns.   Total time spent with patient was 15 minutes;  with greater than 75 percent of that time spent in face to face counseling regarding patient's symptoms,  and coordination of care and follow up.  Disclaimer:This dictation was prepared with Dragon/digital dictation along with [employer]. Any transcriptional errors that result from this process are unintentional.  Drue Second, NP 06/28/2016

## 2016-06-28 NOTE — Assessment & Plan Note (Signed)
Patient presented to the [facility] today.  Receive cycle 2, day 1 of her carboplatin/etoposide chemotherapy regimen.  She developed a hypersensitivity reaction to the etoposide portion of her chemotherapy today.  Patient felt flushed and hot; and also experience a mild rash.  The etoposide infusion was held; and patient received Pepcid, Benadryl, and Solu-Medrol per hypersensitivity protocol.  All symptoms resolved; and patient was able to complete all of her chemotherapy with no further issues.  Patient was given both verbal and printed educational materials regarding the use of both Benadryl and Pepcid for any mild reaction symptoms.  She was encouraged critically to the emergency department for any worsening symptoms whatsoever.

## 2016-06-28 NOTE — Assessment & Plan Note (Signed)
Patient presented to the [facility] today.  Receive cycle 2, day 1 of her carboplatin/etoposide chemotherapy regimen.  She developed a hypersensitivity reaction to the etoposide portion of her chemotherapy today.  See further notes for details.  Patient is scheduled to return both tomorrow and Friday, 06/28/2016 for days #2 and 3 of her chemotherapy regimen.  Also, patient will return on 07/17/2016 for labs, visit, and chemotherapy.  She will also continue with weekly labs.

## 2016-06-28 NOTE — Progress Notes (Signed)
Patient brought boxes of home OTC medications that she is taking.  She took [employer] generic Benadryl 25 mg PO and Pepcid 20 mg PO, Compazine and Ativan at home this am prior to appointment to help since reaction.

## 2016-06-29 ENCOUNTER — Ambulatory Visit: Payer: Medicare Other

## 2016-07-02 ENCOUNTER — Other Ambulatory Visit: Payer: Self-pay | Admitting: Medical Oncology

## 2016-07-02 DIAGNOSIS — C3491 Malignant neoplasm of unspecified part of right bronchus or lung: Secondary | ICD-10-CM

## 2016-07-03 ENCOUNTER — Other Ambulatory Visit (HOSPITAL_BASED_OUTPATIENT_CLINIC_OR_DEPARTMENT_OTHER): Payer: Medicare Other

## 2016-07-03 DIAGNOSIS — C3411 Malignant neoplasm of upper lobe, right bronchus or lung: Secondary | ICD-10-CM | POA: Diagnosis not present

## 2016-07-03 DIAGNOSIS — C3491 Malignant neoplasm of unspecified part of right bronchus or lung: Secondary | ICD-10-CM

## 2016-07-03 LAB — CBC WITH DIFFERENTIAL/PLATELET
BASO%: 0.4 % (ref 0.0–2.0)
Basophils Absolute: 0 10e3/uL (ref 0.0–0.1)
EOS%: 0.1 % (ref 0.0–7.0)
Eosinophils Absolute: 0 10e3/uL (ref 0.0–0.5)
HCT: 27.5 % — ABNORMAL LOW (ref 34.8–46.6)
HGB: 9.2 g/dL — ABNORMAL LOW (ref 11.6–15.9)
LYMPH%: 17.4 % (ref 14.0–49.7)
MCH: 32.9 pg (ref 25.1–34.0)
MCHC: 33.5 g/dL (ref 31.5–36.0)
MCV: 98.2 fL (ref 79.5–101.0)
MONO#: 0.1 10e3/uL (ref 0.1–0.9)
MONO%: 1.2 % (ref 0.0–14.0)
NEUT#: 6 10e3/uL (ref 1.5–6.5)
NEUT%: 80.9 % — ABNORMAL HIGH (ref 38.4–76.8)
Platelets: 412 10e3/uL — ABNORMAL HIGH (ref 145–400)
RBC: 2.8 10e6/uL — ABNORMAL LOW (ref 3.70–5.45)
RDW: 13.7 % (ref 11.2–14.5)
WBC: 7.4 10e3/uL (ref 3.9–10.3)
lymph#: 1.3 10e3/uL (ref 0.9–3.3)

## 2016-07-03 LAB — COMPREHENSIVE METABOLIC PANEL
ALBUMIN: 3.2 g/dL — AB (ref 3.5–5.0)
ALK PHOS: 201 U/L — AB (ref 40–150)
ALT: 44 U/L (ref 0–55)
AST: 24 U/L (ref 5–34)
Anion Gap: 10 mEq/L (ref 3–11)
BILIRUBIN TOTAL: 0.94 mg/dL (ref 0.20–1.20)
BUN: 19.2 mg/dL (ref 7.0–26.0)
CALCIUM: 9 mg/dL (ref 8.4–10.4)
CO2: 26 mEq/L (ref 22–29)
CREATININE: 0.8 mg/dL (ref 0.6–1.1)
Chloride: 97 mEq/L — ABNORMAL LOW (ref 98–109)
EGFR: 87 mL/min/{1.73_m2} — ABNORMAL LOW (ref >90)
GLUCOSE: 106 mg/dL (ref 70–140)
Potassium: 3.8 mEq/L (ref 3.5–5.1)
SODIUM: 133 meq/L — AB (ref 136–145)
TOTAL PROTEIN: 7 g/dL (ref 6.4–8.3)

## 2016-07-04 LAB — ACID FAST CULTURE WITH REFLEXED SENSITIVITIES (MYCOBACTERIA): Acid Fast Culture: NEGATIVE

## 2016-07-09 ENCOUNTER — Other Ambulatory Visit: Payer: Self-pay | Admitting: Medical Oncology

## 2016-07-09 DIAGNOSIS — C3491 Malignant neoplasm of unspecified part of right bronchus or lung: Secondary | ICD-10-CM

## 2016-07-10 ENCOUNTER — Other Ambulatory Visit (HOSPITAL_BASED_OUTPATIENT_CLINIC_OR_DEPARTMENT_OTHER): Payer: Medicare Other

## 2016-07-10 ENCOUNTER — Other Ambulatory Visit: Payer: Self-pay | Admitting: Internal Medicine

## 2016-07-10 DIAGNOSIS — C3411 Malignant neoplasm of upper lobe, right bronchus or lung: Secondary | ICD-10-CM

## 2016-07-10 DIAGNOSIS — C3491 Malignant neoplasm of unspecified part of right bronchus or lung: Secondary | ICD-10-CM

## 2016-07-10 LAB — CBC WITH DIFFERENTIAL/PLATELET
BASO%: 0.1 % (ref 0.0–2.0)
BASOS ABS: 0.1 10*3/uL (ref 0.0–0.1)
EOS%: 0 % (ref 0.0–7.0)
Eosinophils Absolute: 0 10*3/uL (ref 0.0–0.5)
HEMATOCRIT: 25 % — AB (ref 34.8–46.6)
HGB: 8.1 g/dL — ABNORMAL LOW (ref 11.6–15.9)
LYMPH#: 4.5 10*3/uL — AB (ref 0.9–3.3)
LYMPH%: 9.7 % — AB (ref 14.0–49.7)
MCH: 31.5 pg (ref 25.1–34.0)
MCHC: 32.5 g/dL (ref 31.5–36.0)
MCV: 97 fL (ref 79.5–101.0)
MONO#: 2.4 10*3/uL — ABNORMAL HIGH (ref 0.1–0.9)
MONO%: 5.1 % (ref 0.0–14.0)
NEUT#: 39.2 10*3/uL — ABNORMAL HIGH (ref 1.5–6.5)
NEUT%: 85.1 % — AB (ref 38.4–76.8)
PLATELETS: 51 10*3/uL — AB (ref 145–400)
RBC: 2.58 10*6/uL — AB (ref 3.70–5.45)
RDW: 14.4 % (ref 11.2–14.5)
WBC: 46.1 10*3/uL — ABNORMAL HIGH (ref 3.9–10.3)

## 2016-07-10 LAB — COMPREHENSIVE METABOLIC PANEL
ALT: 22 U/L (ref 0–55)
ANION GAP: 12 meq/L — AB (ref 3–11)
AST: 14 U/L (ref 5–34)
Albumin: 3.1 g/dL — ABNORMAL LOW (ref 3.5–5.0)
Alkaline Phosphatase: 182 U/L — ABNORMAL HIGH (ref 40–150)
BUN: 6.5 mg/dL — ABNORMAL LOW (ref 7.0–26.0)
CALCIUM: 8.7 mg/dL (ref 8.4–10.4)
CHLORIDE: 100 meq/L (ref 98–109)
CO2: 28 meq/L (ref 22–29)
Creatinine: 0.8 mg/dL (ref 0.6–1.1)
EGFR: 84 mL/min/{1.73_m2} — ABNORMAL LOW (ref >90)
Glucose: 99 mg/dl (ref 70–140)
POTASSIUM: 2.9 meq/L — AB (ref 3.5–5.1)
Sodium: 140 mEq/L (ref 136–145)
Total Bilirubin: <0.22 mg/dL (ref 0.20–1.20)
Total Protein: 6.6 g/dL (ref 6.4–8.3)

## 2016-07-10 MED ORDER — POTASSIUM CHLORIDE ER 20 MEQ PO TBCR: 40.0000 meq | EXTENDED_RELEASE_TABLET | Freq: Every day | ORAL | 0 refills | Status: DC

## 2016-07-10 MED FILL — KLOR-CON M20 TABLET: 20 | 10 days supply | Qty: 20 | Fill #0

## 2016-07-15 ENCOUNTER — Ambulatory Visit (HOSPITAL_COMMUNITY): Payer: Medicare Other

## 2016-07-16 ENCOUNTER — Telehealth: Payer: Self-pay | Admitting: Medical Oncology

## 2016-07-16 ENCOUNTER — Other Ambulatory Visit: Payer: Self-pay | Admitting: Medical Oncology

## 2016-07-16 DIAGNOSIS — C3491 Malignant neoplasm of unspecified part of right bronchus or lung: Secondary | ICD-10-CM

## 2016-07-16 NOTE — Telephone Encounter (Signed)
Pt notified to pick up and start kdur supplements.

## 2016-07-17 ENCOUNTER — Encounter: Payer: Self-pay | Admitting: Internal Medicine

## 2016-07-17 ENCOUNTER — Ambulatory Visit (HOSPITAL_BASED_OUTPATIENT_CLINIC_OR_DEPARTMENT_OTHER): Payer: Medicare Other | Admitting: Internal Medicine

## 2016-07-17 ENCOUNTER — Ambulatory Visit (HOSPITAL_BASED_OUTPATIENT_CLINIC_OR_DEPARTMENT_OTHER): Payer: Medicare Other

## 2016-07-17 ENCOUNTER — Telehealth: Payer: Self-pay | Admitting: Internal Medicine

## 2016-07-17 ENCOUNTER — Other Ambulatory Visit (HOSPITAL_BASED_OUTPATIENT_CLINIC_OR_DEPARTMENT_OTHER): Payer: Medicare Other

## 2016-07-17 ENCOUNTER — Other Ambulatory Visit: Payer: Self-pay | Admitting: Internal Medicine

## 2016-07-17 VITALS — BP 125/79 | HR 113 | Temp 98.4°F | Resp 18 | Ht 63.0 in | Wt 123.4 lb

## 2016-07-17 DIAGNOSIS — F329 Major depressive disorder, single episode, unspecified: Secondary | ICD-10-CM

## 2016-07-17 DIAGNOSIS — C3411 Malignant neoplasm of upper lobe, right bronchus or lung: Secondary | ICD-10-CM

## 2016-07-17 DIAGNOSIS — C3491 Malignant neoplasm of unspecified part of right bronchus or lung: Secondary | ICD-10-CM

## 2016-07-17 DIAGNOSIS — Z5111 Encounter for antineoplastic chemotherapy: Secondary | ICD-10-CM | POA: Diagnosis not present

## 2016-07-17 DIAGNOSIS — R5383 Other fatigue: Secondary | ICD-10-CM | POA: Diagnosis not present

## 2016-07-17 HISTORY — DX: Encounter for antineoplastic chemotherapy: Z51.11

## 2016-07-17 LAB — CBC WITH DIFFERENTIAL/PLATELET
BASO%: 0.4 % (ref 0.0–2.0)
Basophils Absolute: 0.1 10*3/uL (ref 0.0–0.1)
EOS%: 0.1 % (ref 0.0–7.0)
Eosinophils Absolute: 0 10*3/uL (ref 0.0–0.5)
HEMATOCRIT: 26 % — AB (ref 34.8–46.6)
HEMOGLOBIN: 8.6 g/dL — AB (ref 11.6–15.9)
LYMPH#: 2.3 10*3/uL (ref 0.9–3.3)
LYMPH%: 16.8 % (ref 14.0–49.7)
MCH: 32.3 pg (ref 25.1–34.0)
MCHC: 33 g/dL (ref 31.5–36.0)
MCV: 97.9 fL (ref 79.5–101.0)
MONO#: 1.4 10*3/uL — AB (ref 0.1–0.9)
MONO%: 9.9 % (ref 0.0–14.0)
NEUT%: 72.8 % (ref 38.4–76.8)
NEUTROS ABS: 9.9 10*3/uL — AB (ref 1.5–6.5)
Platelets: 629 10*3/uL — ABNORMAL HIGH (ref 145–400)
RBC: 2.66 10*6/uL — ABNORMAL LOW (ref 3.70–5.45)
RDW: 15.1 % — AB (ref 11.2–14.5)
WBC: 13.7 10*3/uL — ABNORMAL HIGH (ref 3.9–10.3)

## 2016-07-17 LAB — COMPREHENSIVE METABOLIC PANEL
ALBUMIN: 3.3 g/dL — AB (ref 3.5–5.0)
ALK PHOS: 164 U/L — AB (ref 40–150)
ALT: 15 U/L (ref 0–55)
AST: 15 U/L (ref 5–34)
Anion Gap: 11 mEq/L (ref 3–11)
BILIRUBIN TOTAL: 0.34 mg/dL (ref 0.20–1.20)
BUN: 5.4 mg/dL — AB (ref 7.0–26.0)
CALCIUM: 9.7 mg/dL (ref 8.4–10.4)
CHLORIDE: 101 meq/L (ref 98–109)
CO2: 25 mEq/L (ref 22–29)
CREATININE: 0.8 mg/dL (ref 0.6–1.1)
EGFR: 84 mL/min/{1.73_m2} — ABNORMAL LOW (ref >90)
Glucose: 99 mg/dl (ref 70–140)
Potassium: 3.8 mEq/L (ref 3.5–5.1)
Sodium: 137 mEq/L (ref 136–145)
TOTAL PROTEIN: 7.3 g/dL (ref 6.4–8.3)

## 2016-07-17 MED ORDER — HEPARIN SOD (PORK) LOCK FLUSH 100 UNIT/ML IV SOLN
500.0000 [IU] | Freq: Once | INTRAVENOUS | Status: AC | PRN
  Administered 2016-07-17: 500 [IU]
  Filled 2016-07-17: qty 5

## 2016-07-17 MED ORDER — PALONOSETRON HCL INJECTION 0.25 MG/5ML
INTRAVENOUS | Status: AC
  Filled 2016-07-17: qty 5

## 2016-07-17 MED ORDER — DEXAMETHASONE SODIUM PHOSPHATE 10 MG/ML IJ SOLN
INTRAMUSCULAR | Status: AC
  Filled 2016-07-17: qty 1

## 2016-07-17 MED ORDER — SODIUM CHLORIDE 0.9% FLUSH
10.0000 mL | INTRAVENOUS | Status: DC | PRN
  Administered 2016-07-17: 10 mL
  Filled 2016-07-17: qty 10

## 2016-07-17 MED ORDER — PALONOSETRON HCL INJECTION 0.25 MG/5ML
0.2500 mg | Freq: Once | INTRAVENOUS | Status: AC
  Administered 2016-07-17: 0.25 mg via INTRAVENOUS

## 2016-07-17 MED ORDER — DIPHENHYDRAMINE HCL 50 MG/ML IJ SOLN
25.0000 mg | Freq: Once | INTRAMUSCULAR | Status: AC
  Administered 2016-07-17: 25 mg via INTRAVENOUS

## 2016-07-17 MED ORDER — DEXAMETHASONE SODIUM PHOSPHATE 10 MG/ML IJ SOLN
10.0000 mg | Freq: Once | INTRAMUSCULAR | Status: AC
  Administered 2016-07-17: 10 mg via INTRAVENOUS

## 2016-07-17 MED ORDER — DIPHENHYDRAMINE HCL 50 MG/ML IJ SOLN
INTRAMUSCULAR | Status: AC
  Filled 2016-07-17: qty 1

## 2016-07-17 MED ORDER — MIRTAZAPINE 30 MG PO TABS: 30.0000 mg | ORAL_TABLET | Freq: Every day | ORAL | 2 refills | Status: DC

## 2016-07-17 MED ORDER — SODIUM CHLORIDE 0.9 % IV SOLN
466.0000 mg | Freq: Once | INTRAVENOUS | Status: AC
  Administered 2016-07-17: 470 mg via INTRAVENOUS
  Filled 2016-07-17: qty 47

## 2016-07-17 MED ORDER — SODIUM CHLORIDE 0.9 % IV SOLN
Freq: Once | INTRAVENOUS | Status: AC
  Administered 2016-07-17: 14:00:00 via INTRAVENOUS

## 2016-07-17 MED ORDER — SODIUM CHLORIDE 0.9 % IV SOLN
100.0000 mg/m2 | Freq: Once | INTRAVENOUS | Status: AC
  Administered 2016-07-17: 160 mg via INTRAVENOUS
  Filled 2016-07-17: qty 8

## 2016-07-17 MED FILL — MIRTAZAPINE 30 MG TABLET: 30 | 30 days supply | Qty: 30 | Fill #0

## 2016-07-17 NOTE — Patient Instructions (Signed)
[facility] Discharge Instructions for Patients Receiving Chemotherapy  Today you received the following chemotherapy agents:  Carboplatin, Etoposide  To help prevent nausea and vomiting after your treatment, we encourage you to take your nausea medication as prescribed.   If you develop nausea and vomiting that is not controlled by your nausea medication, call the clinic.   BELOW ARE SYMPTOMS THAT SHOULD BE REPORTED IMMEDIATELY:  *FEVER GREATER THAN 100.5 F  *CHILLS WITH OR WITHOUT FEVER  NAUSEA AND VOMITING THAT IS NOT CONTROLLED WITH YOUR NAUSEA MEDICATION  *UNUSUAL SHORTNESS OF BREATH  *UNUSUAL BRUISING OR BLEEDING  TENDERNESS IN MOUTH AND THROAT WITH OR WITHOUT PRESENCE OF ULCERS  *URINARY PROBLEMS  *BOWEL PROBLEMS  UNUSUAL RASH Items with * indicate a potential emergency and should be followed up as soon as possible.  Feel free to call the clinic you have any questions or concerns. The clinic phone number is [phone].  Please show the [place] at check-in to the Emergency Department and triage nurse.

## 2016-07-17 NOTE — Progress Notes (Addendum)
[facility] [phone]   [phone] [place], [place] [address] [place] [address]  DIAGNOSIS: Extensive stage (T2a, N3, [name] in addition to large right anterior mediastinal and supraclavicular lymphadenopathy as well as pancreatic and questionably splenic metastatic lesions diagnosed in September 2017.  PRIOR THERAPY: None.  CURRENT THERAPY: Systemic chemotherapy with carboplatin for AUC of 5 on day 1 and etoposide 100 MG/M2 on days 1, 2 and 3 with Neulasta support. Status post 2 cycles.  INTERVAL HISTORY: Debra Kidd 55 y.o. female returns to the clinic today for follow-up visit accompanied by her husband. The patient is tolerating her systemic chemotherapy with carboplatin and etoposide fairly well except for aching pain and fatigue after the Neulasta injection. She denied having any significant fever or chills. She has no nausea or vomiting. She denied having any significant chest pain, shortness of breath, cough or hemoptysis. She has no significant weight loss or night sweats. She was supposed to have repeat CT scan of the chest, abdomen and pelvis before this visit but unfortunately the patient canceled the appointment. She is here today for evaluation before starting cycle #3 of her chemotherapy.  MEDICAL HISTORY: Past Medical History:  Diagnosis Date  . 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 Blood type, Rh positive   . Cancer ([place])   . Chronic pain syndrome   . Hyperlipidemia   . Hypertension   . Osteoporosis     ALLERGIES:  is allergic to codeine; motrin [ibuprofen]; thiazide-type diuretics; and vicodin [hydrocodone-acetaminophen].  MEDICATIONS:  Current Outpatient Prescriptions  Medication Sig Dispense Refill  . calcium carbonate (TUMS - DOSED IN MG ELEMENTAL CALCIUM) 500 MG chewable tablet Chew 1 tablet by mouth once a week.     . lidocaine-prilocaine (EMLA) cream Apply 1 application topically as needed. 30 g 0  . LORazepam (ATIVAN) 1 MG tablet Take 1 mg by mouth 2 (two) times daily.    Marland Kitchen losartan (COZAAR) 50 MG tablet Take 50 mg by mouth daily.    Marland Kitchen oxyCODONE-acetaminophen (PERCOCET/ROXICET) 5-325 MG tablet Take 0.5 tablets by mouth every 4 (four) hours as needed for severe pain.    Marland Kitchen Potassium Chloride ER 20 MEQ TBCR Take 40 mEq by mouth daily. 2 tablets 20 mg each every day for 10 days. 20 tablet 0  . prochlorperazine (COMPAZINE) 10 MG tablet Take 1 tablet (10 mg total) by mouth every 6 (six) hours as needed for nausea or vomiting. 30 tablet 0  . traMADol (ULTRAM) 50 MG tablet Take by mouth every 6 (six) hours as needed.    Marland Kitchen KLOR-CON M20 20 MEQ tablet Take 20 mEq by mouth daily.  0   No current facility-administered medications for this visit.     SURGICAL HISTORY:  Past Surgical History:  Procedure Laterality Date  . ABDOMINAL HYSTERECTOMY  1981  . EYE SURGERY     left  . IR GENERIC HISTORICAL  06/03/2016   IR FLUORO GUIDE PORT INSERTION RIGHT 06/03/2016 [facility]  . IR GENERIC HISTORICAL  06/03/2016   IR US GUIDE VASC ACCESS RIGHT 06/03/2016 [facility]  . SKIN CANCER EXCISION     basal cell carcinoma L side of face    REVIEW OF SYSTEMS:  A comprehensive review of systems was negative except for: Constitutional: positive for fatigue   PHYSICAL EXAMINATION: General  appearance: alert, cooperative, fatigued and no distress Head: Normocephalic, without obvious abnormality, atraumatic Neck: no adenopathy, no JVD, supple, symmetrical, trachea midline and thyroid not enlarged, symmetric, no tenderness/mass/nodules Lymph nodes: Cervical, supraclavicular, and axillary nodes normal. Resp: clear to auscultation bilaterally Back: symmetric, no curvature. ROM normal. No CVA tenderness. Cardio: regular rate and rhythm, S1, S2 normal, no murmur, click, rub or gallop GI: soft, non-tender; bowel sounds normal;  no masses,  no organomegaly Extremities: extremities normal, atraumatic, no cyanosis or edema  ECOG PERFORMANCE STATUS: 1 - Symptomatic but completely ambulatory  Blood pressure 125/79, pulse (!) 113, temperature 98.4 F (36.9 C), temperature source Oral, resp. rate 18, height '5\' 3"'$  (1.6 m), weight 123 lb 6.4 oz (56 kg), SpO2 100 %.  LABORATORY DATA: Lab Results  Component Value Date   WBC 13.7 (H) 07/17/2016   HGB 8.6 (L) 07/17/2016   HCT 26.0 (L) 07/17/2016   MCV 97.9 07/17/2016   PLT 629 (H) 07/17/2016      Chemistry      Component Value Date/Time   NA 137 07/17/2016 1110   K 3.8 07/17/2016 1110   CL 99 (L) 06/03/2016 1000   CO2 25 07/17/2016 1110   BUN 5.4 (L) 07/17/2016 1110   CREATININE 0.8 07/17/2016 1110      Component Value Date/Time   CALCIUM 9.7 07/17/2016 1110   ALKPHOS 164 (H) 07/17/2016 1110   AST 15 07/17/2016 1110   ALT 15 07/17/2016 1110   BILITOT 0.34 07/17/2016 1110       RADIOGRAPHIC STUDIES: No results found.  ASSESSMENT AND PLAN: This is a very pleasant 55 years old white female with extensive stage small cell lung cancer. She is currently undergoing systemic chemotherapy with carboplatin and etoposide status post 2 cycles. She is tolerating her treatment well with no significant adverse effects except for fatigue. I recommended for the patient to proceed with cycle #3 today as a scheduled. I will reschedule her CT scan of the chest, abdomen and pelvis to be performed in the next few days for restaging of her disease. I will see the patient back for follow-up visit in 3 weeks for evaluation before starting cycle #4. For depression, I started the patient on Remeron 30 mg by mouth daily at bedtime. She was advised to call immediately if she has any concerning symptoms in the interval. The patient voices understanding of current disease status and treatment options and is in agreement with the current care plan.  All questions were answered. The  patient knows to call the clinic with any problems, questions or concerns. We can certainly see the patient much sooner if necessary.  Disclaimer: This note was dictated with voice recognition software. Similar sounding words can inadvertently be transcribed and may not be corrected upon review.

## 2016-07-17 NOTE — Telephone Encounter (Signed)
Gave patient avs report and appointments for November thru January. Per 11/15 los reschedule scan to next few days - pt cxd appointment.   Ct scan was not actually scheduled. Per notes in ct order pt did not want to schedule test.  Spoke with patient re ct and she will call to have central to inform them she is ready to schedule and schedule for a day she can come back to [place] in the next few days.   Order to managed care to check on pre auth.

## 2016-07-18 ENCOUNTER — Other Ambulatory Visit: Payer: Self-pay | Admitting: *Deleted

## 2016-07-18 ENCOUNTER — Ambulatory Visit (HOSPITAL_BASED_OUTPATIENT_CLINIC_OR_DEPARTMENT_OTHER): Payer: Medicare Other

## 2016-07-18 VITALS — BP 123/84 | HR 103 | Temp 99.0°F | Resp 17

## 2016-07-18 DIAGNOSIS — R309 Painful micturition, unspecified: Secondary | ICD-10-CM

## 2016-07-18 DIAGNOSIS — Z5111 Encounter for antineoplastic chemotherapy: Secondary | ICD-10-CM | POA: Diagnosis present

## 2016-07-18 DIAGNOSIS — C3411 Malignant neoplasm of upper lobe, right bronchus or lung: Secondary | ICD-10-CM | POA: Diagnosis not present

## 2016-07-18 DIAGNOSIS — C3491 Malignant neoplasm of unspecified part of right bronchus or lung: Secondary | ICD-10-CM

## 2016-07-18 LAB — URINALYSIS, MICROSCOPIC - CHCC
BILIRUBIN (URINE): NEGATIVE
BLOOD: NEGATIVE
Glucose: NEGATIVE mg/dL
KETONES: NEGATIVE mg/dL
Leukocyte Esterase: NEGATIVE
NITRITE: NEGATIVE
Protein: NEGATIVE mg/dL
RBC / HPF: NEGATIVE (ref 0–2)
Specific Gravity, Urine: 1.005 (ref 1.003–1.035)
Urobilinogen, UR: 0.2 mg/dL (ref 0.2–1)
pH: 6 (ref 4.6–8.0)

## 2016-07-18 MED ORDER — DEXAMETHASONE SODIUM PHOSPHATE 10 MG/ML IJ SOLN
10.0000 mg | Freq: Once | INTRAMUSCULAR | Status: AC
  Administered 2016-07-18: 10 mg via INTRAVENOUS

## 2016-07-18 MED ORDER — DIPHENHYDRAMINE HCL 50 MG/ML IJ SOLN
25.0000 mg | Freq: Once | INTRAMUSCULAR | Status: AC
  Administered 2016-07-18: 25 mg via INTRAVENOUS

## 2016-07-18 MED ORDER — SODIUM CHLORIDE 0.9% FLUSH
10.0000 mL | INTRAVENOUS | Status: DC | PRN
  Administered 2016-07-18: 10 mL
  Filled 2016-07-18: qty 10

## 2016-07-18 MED ORDER — DIPHENHYDRAMINE HCL 50 MG/ML IJ SOLN
INTRAMUSCULAR | Status: AC
  Filled 2016-07-18: qty 1

## 2016-07-18 MED ORDER — HEPARIN SOD (PORK) LOCK FLUSH 100 UNIT/ML IV SOLN
500.0000 [IU] | Freq: Once | INTRAVENOUS | Status: AC | PRN
  Administered 2016-07-18: 500 [IU]
  Filled 2016-07-18: qty 5

## 2016-07-18 MED ORDER — SODIUM CHLORIDE 0.9 % IV SOLN
Freq: Once | INTRAVENOUS | Status: AC
  Administered 2016-07-18: 12:00:00 via INTRAVENOUS

## 2016-07-18 MED ORDER — DEXAMETHASONE SODIUM PHOSPHATE 10 MG/ML IJ SOLN
INTRAMUSCULAR | Status: AC
  Filled 2016-07-18: qty 1

## 2016-07-18 MED ORDER — SODIUM CHLORIDE 0.9 % IV SOLN
100.0000 mg/m2 | Freq: Once | INTRAVENOUS | Status: AC
  Administered 2016-07-18: 160 mg via INTRAVENOUS
  Filled 2016-07-18: qty 8

## 2016-07-18 NOTE — Progress Notes (Signed)
  ua ordered on pt

## 2016-07-18 NOTE — Patient Instructions (Signed)
[facility] Discharge Instructions for Patients Receiving Chemotherapy  Today you received the following chemotherapy agents Etoposide.   To help prevent nausea and vomiting after your treatment, we encourage you to take your nausea medication as prescribed.   If you develop nausea and vomiting that is not controlled by your nausea medication, call the clinic.   BELOW ARE SYMPTOMS THAT SHOULD BE REPORTED IMMEDIATELY:  *FEVER GREATER THAN 100.5 F  *CHILLS WITH OR WITHOUT FEVER  NAUSEA AND VOMITING THAT IS NOT CONTROLLED WITH YOUR NAUSEA MEDICATION  *UNUSUAL SHORTNESS OF BREATH  *UNUSUAL BRUISING OR BLEEDING  TENDERNESS IN MOUTH AND THROAT WITH OR WITHOUT PRESENCE OF ULCERS  *URINARY PROBLEMS  *BOWEL PROBLEMS  UNUSUAL RASH Items with * indicate a potential emergency and should be followed up as soon as possible.  Feel free to call the clinic you have any questions or concerns. The clinic phone number is [phone].  Please show the CHEMO ALERT CARD at check-in to the Emergency Department and triage nurse.   

## 2016-07-19 ENCOUNTER — Ambulatory Visit (HOSPITAL_BASED_OUTPATIENT_CLINIC_OR_DEPARTMENT_OTHER): Payer: Medicare Other

## 2016-07-19 VITALS — BP 125/79 | HR 103 | Temp 98.2°F | Resp 18

## 2016-07-19 DIAGNOSIS — C3411 Malignant neoplasm of upper lobe, right bronchus or lung: Secondary | ICD-10-CM | POA: Diagnosis not present

## 2016-07-19 DIAGNOSIS — Z5111 Encounter for antineoplastic chemotherapy: Secondary | ICD-10-CM

## 2016-07-19 DIAGNOSIS — C3491 Malignant neoplasm of unspecified part of right bronchus or lung: Secondary | ICD-10-CM

## 2016-07-19 LAB — URINE CULTURE

## 2016-07-19 MED ORDER — DEXAMETHASONE SODIUM PHOSPHATE 10 MG/ML IJ SOLN
INTRAMUSCULAR | Status: AC
  Filled 2016-07-19: qty 1

## 2016-07-19 MED ORDER — DEXAMETHASONE SODIUM PHOSPHATE 10 MG/ML IJ SOLN
10.0000 mg | Freq: Once | INTRAMUSCULAR | Status: AC
  Administered 2016-07-19: 10 mg via INTRAVENOUS

## 2016-07-19 MED ORDER — SODIUM CHLORIDE 0.9% FLUSH
10.0000 mL | INTRAVENOUS | Status: DC | PRN
  Administered 2016-07-19: 10 mL
  Filled 2016-07-19: qty 10

## 2016-07-19 MED ORDER — DIPHENHYDRAMINE HCL 50 MG/ML IJ SOLN
INTRAMUSCULAR | Status: AC
  Filled 2016-07-19: qty 1

## 2016-07-19 MED ORDER — HEPARIN SOD (PORK) LOCK FLUSH 100 UNIT/ML IV SOLN
500.0000 [IU] | Freq: Once | INTRAVENOUS | Status: AC | PRN
  Administered 2016-07-19: 500 [IU]
  Filled 2016-07-19: qty 5

## 2016-07-19 MED ORDER — PEGFILGRASTIM 6 MG/0.6ML ~~LOC~~ PSKT
6.0000 mg | PREFILLED_SYRINGE | Freq: Once | SUBCUTANEOUS | Status: AC
  Administered 2016-07-19: 6 mg via SUBCUTANEOUS
  Filled 2016-07-19: qty 0.6

## 2016-07-19 MED ORDER — DIPHENHYDRAMINE HCL 50 MG/ML IJ SOLN
25.0000 mg | Freq: Once | INTRAMUSCULAR | Status: AC
  Administered 2016-07-19: 25 mg via INTRAVENOUS

## 2016-07-19 MED ORDER — SODIUM CHLORIDE 0.9 % IV SOLN
100.0000 mg/m2 | Freq: Once | INTRAVENOUS | Status: AC
  Administered 2016-07-19: 160 mg via INTRAVENOUS
  Filled 2016-07-19: qty 8

## 2016-07-19 MED ORDER — SODIUM CHLORIDE 0.9 % IV SOLN
Freq: Once | INTRAVENOUS | Status: AC
  Administered 2016-07-19: 13:00:00 via INTRAVENOUS

## 2016-07-19 NOTE — Patient Instructions (Signed)
[facility] Discharge Instructions for Patients Receiving Chemotherapy  Today you received the following chemotherapy agents Etoposide.   To help prevent nausea and vomiting after your treatment, we encourage you to take your nausea medication as prescribed.   If you develop nausea and vomiting that is not controlled by your nausea medication, call the clinic.   BELOW ARE SYMPTOMS THAT SHOULD BE REPORTED IMMEDIATELY:  *FEVER GREATER THAN 100.5 F  *CHILLS WITH OR WITHOUT FEVER  NAUSEA AND VOMITING THAT IS NOT CONTROLLED WITH YOUR NAUSEA MEDICATION  *UNUSUAL SHORTNESS OF BREATH  *UNUSUAL BRUISING OR BLEEDING  TENDERNESS IN MOUTH AND THROAT WITH OR WITHOUT PRESENCE OF ULCERS  *URINARY PROBLEMS  *BOWEL PROBLEMS  UNUSUAL RASH Items with * indicate a potential emergency and should be followed up as soon as possible.  Feel free to call the clinic you have any questions or concerns. The clinic phone number is [phone].  Please show the CHEMO ALERT CARD at check-in to the Emergency Department and triage nurse.   

## 2016-07-20 ENCOUNTER — Ambulatory Visit: Payer: Medicare Other

## 2016-07-23 ENCOUNTER — Other Ambulatory Visit: Payer: Self-pay | Admitting: Medical Oncology

## 2016-07-23 DIAGNOSIS — C3491 Malignant neoplasm of unspecified part of right bronchus or lung: Secondary | ICD-10-CM

## 2016-07-24 ENCOUNTER — Ambulatory Visit (HOSPITAL_COMMUNITY)
Admission: RE | Admit: 2016-07-24 | Discharge: 2016-07-24 | Disposition: A | Discharge status: Home / Self Care | Payer: Medicare Other | Source: Ambulatory Visit | Attending: Nurse Practitioner | Admitting: Nurse Practitioner

## 2016-07-24 ENCOUNTER — Encounter (HOSPITAL_COMMUNITY): Payer: Self-pay

## 2016-07-24 ENCOUNTER — Other Ambulatory Visit (HOSPITAL_BASED_OUTPATIENT_CLINIC_OR_DEPARTMENT_OTHER): Payer: Medicare Other

## 2016-07-24 DIAGNOSIS — C3411 Malignant neoplasm of upper lobe, right bronchus or lung: Secondary | ICD-10-CM | POA: Diagnosis not present

## 2016-07-24 DIAGNOSIS — C3491 Malignant neoplasm of unspecified part of right bronchus or lung: Secondary | ICD-10-CM

## 2016-07-24 LAB — CBC WITH DIFFERENTIAL/PLATELET
BASO%: 0.2 % (ref 0.0–2.0)
BASOS ABS: 0 10*3/uL (ref 0.0–0.1)
EOS ABS: 0 10*3/uL (ref 0.0–0.5)
EOS%: 0.1 % (ref 0.0–7.0)
HEMATOCRIT: 21.1 % — AB (ref 34.8–46.6)
HEMOGLOBIN: 7 g/dL — AB (ref 11.6–15.9)
LYMPH#: 1.7 10*3/uL (ref 0.9–3.3)
LYMPH%: 10 % — ABNORMAL LOW (ref 14.0–49.7)
MCH: 32.6 pg (ref 25.1–34.0)
MCHC: 33.2 g/dL (ref 31.5–36.0)
MCV: 98.1 fL (ref 79.5–101.0)
MONO#: 0.2 10*3/uL (ref 0.1–0.9)
MONO%: 1.3 % (ref 0.0–14.0)
NEUT%: 88.4 % — ABNORMAL HIGH (ref 38.4–76.8)
NEUTROS ABS: 15.3 10*3/uL — AB (ref 1.5–6.5)
Platelets: 244 10*3/uL (ref 145–400)
RBC: 2.15 10*6/uL — ABNORMAL LOW (ref 3.70–5.45)
RDW: 15.7 % — AB (ref 11.2–14.5)
WBC: 17.4 10*3/uL — ABNORMAL HIGH (ref 3.9–10.3)

## 2016-07-24 LAB — COMPREHENSIVE METABOLIC PANEL
ALBUMIN: 3.4 g/dL — AB (ref 3.5–5.0)
ALK PHOS: 173 U/L — AB (ref 40–150)
ALT: 34 U/L (ref 0–55)
AST: 16 U/L (ref 5–34)
Anion Gap: 9 mEq/L (ref 3–11)
BILIRUBIN TOTAL: 0.75 mg/dL (ref 0.20–1.20)
BUN: 14.8 mg/dL (ref 7.0–26.0)
CALCIUM: 9.4 mg/dL (ref 8.4–10.4)
CO2: 24 mEq/L (ref 22–29)
CREATININE: 0.7 mg/dL (ref 0.6–1.1)
Chloride: 101 mEq/L (ref 98–109)
EGFR: >90 mL/min/{1.73_m2} (ref >90)
Glucose: 105 mg/dl (ref 70–140)
POTASSIUM: 4.1 meq/L (ref 3.5–5.1)
Sodium: 134 mEq/L — ABNORMAL LOW (ref 136–145)
TOTAL PROTEIN: 7 g/dL (ref 6.4–8.3)

## 2016-07-24 MED ORDER — IOPAMIDOL (ISOVUE-300) INJECTION 61%
100.0000 mL | Freq: Once | INTRAVENOUS | Status: AC | PRN
  Administered 2016-07-24: 100 mL via INTRAVENOUS

## 2016-07-26 ENCOUNTER — Other Ambulatory Visit: Payer: Self-pay | Admitting: Medical Oncology

## 2016-07-26 ENCOUNTER — Ambulatory Visit: Payer: Medicare Other

## 2016-07-26 ENCOUNTER — Ambulatory Visit (HOSPITAL_COMMUNITY)
Admission: RE | Admit: 2016-07-26 | Discharge: 2016-07-26 | Disposition: A | Discharge status: Home / Self Care | Payer: Medicare Other | Source: Ambulatory Visit | Attending: Internal Medicine | Admitting: Internal Medicine

## 2016-07-26 ENCOUNTER — Telehealth: Payer: Self-pay | Admitting: Medical Oncology

## 2016-07-26 DIAGNOSIS — D649 Anemia, unspecified: Secondary | ICD-10-CM | POA: Insufficient documentation

## 2016-07-26 NOTE — Telephone Encounter (Addendum)
Pt cannot come today for type and cross . Scheduled for tomorrow for infusion nurses to draw type and cross labs and transfuse 2 units. Pt notified to come to infusion room tomorrow between 0745 and 0800

## 2016-07-27 ENCOUNTER — Ambulatory Visit (HOSPITAL_BASED_OUTPATIENT_CLINIC_OR_DEPARTMENT_OTHER): Payer: Medicare Other

## 2016-07-27 DIAGNOSIS — D649 Anemia, unspecified: Secondary | ICD-10-CM | POA: Diagnosis not present

## 2016-07-27 LAB — PREPARE RBC (CROSSMATCH)

## 2016-07-27 MED ORDER — DIPHENHYDRAMINE HCL 25 MG PO CAPS
25.0000 mg | ORAL_CAPSULE | Freq: Once | ORAL | Status: AC
  Administered 2016-07-27: 25 mg via ORAL

## 2016-07-27 MED ORDER — ACETAMINOPHEN 325 MG PO TABS
650.0000 mg | ORAL_TABLET | Freq: Once | ORAL | Status: AC
  Administered 2016-07-27: 650 mg via ORAL

## 2016-07-27 MED ORDER — ACETAMINOPHEN 325 MG PO TABS
ORAL_TABLET | ORAL | Status: AC
  Filled 2016-07-27: qty 2

## 2016-07-27 MED ORDER — HEPARIN SOD (PORK) LOCK FLUSH 100 UNIT/ML IV SOLN
500.0000 [IU] | Freq: Every day | INTRAVENOUS | Status: AC | PRN
  Administered 2016-07-27: 500 [IU]
  Filled 2016-07-27: qty 5

## 2016-07-27 MED ORDER — DIPHENHYDRAMINE HCL 25 MG PO CAPS
ORAL_CAPSULE | ORAL | Status: AC
  Filled 2016-07-27: qty 1

## 2016-07-27 MED ORDER — SODIUM CHLORIDE 0.9 % IV SOLN
250.0000 mL | Freq: Once | INTRAVENOUS | Status: AC
  Administered 2016-07-27: 250 mL via INTRAVENOUS

## 2016-07-27 MED ORDER — SODIUM CHLORIDE 0.9% FLUSH
10.0000 mL | INTRAVENOUS | Status: AC | PRN
  Administered 2016-07-27: 10 mL
  Filled 2016-07-27: qty 10

## 2016-07-27 NOTE — Patient Instructions (Signed)
Blood Transfusion , Adult A blood transfusion is a procedure in which you receive donated blood, including plasma, platelets, and red blood cells, through an IV tube. You may need a blood transfusion because of illness, surgery, or injury. The blood may come from a donor. You may also be able to donate blood for yourself (autologous blood donation) before a surgery if you know that you might require a blood transfusion. The blood given in a transfusion is made up of different types of cells. You may receive:  Red blood cells. These carry oxygen to the cells in the body.  White blood cells. These help you fight infections.  Platelets. These help your blood to clot.  Plasma. This is the liquid part of your blood and it helps with fluid imbalances. If you have hemophilia or another clotting disorder, you may also receive other types of blood products. Tell a health care provider about:  Any allergies you have.  All medicines you are taking, including vitamins, herbs, eye drops, creams, and over-the-counter medicines.  Any problems you or family members have had with anesthetic medicines.  Any blood disorders you have.  Any surgeries you have had.  Any medical conditions you have, including any recent fever or cold symptoms.  Whether you are pregnant or may be pregnant.  Any previous reactions you have had during a blood transfusion. What are the risks? Generally, this is a safe procedure. However, problems may occur, including:  Having an allergic reaction to something in the donated blood. Hives and itching may be symptoms of this type of reaction.  Fever. This may be a reaction to the white blood cells in the transfused blood. Nausea or chest pain may accompany a fever.  Iron overload. This can happen from having many transfusions.  Transfusion-related acute lung injury (TRALI). This is a rare reaction that causes lung damage. The cause is not known.TRALI can occur within hours  of a transfusion or several days later.  Sudden (acute) or delayed hemolytic reactions. This happens if your blood does not match the cells in your transfusion. Your body's defense system (immune system) may try to attack the new cells. This complication is rare. The symptoms include fever, chills, nausea, and low back pain or chest pain.  Infection or disease transmission. This is rare. What happens before the procedure?  You will have a blood test to determine your blood type. This is necessary to know what kind of blood your body will accept and to match it to the donor blood.  If you are going to have a planned surgery, you may be able to do an autologous blood donation. This may be done in case you need to have a transfusion.  If you have had an allergic reaction to a transfusion in the past, you may be given medicine to help prevent a reaction. This medicine may be given to you by mouth or through an IV tube.  You will have your temperature, blood pressure, and pulse monitored before the transfusion.  Follow instructions from your health care provider about eating and drinking restrictions.  Ask your health care provider about:  Changing or stopping your regular medicines. This is especially important if you are taking diabetes medicines or blood thinners.  Taking medicines such as aspirin and ibuprofen. These medicines can thin your blood. Do not take these medicines before your procedure if your health care provider instructs you not to. What happens during the procedure?  An IV tube will be   inserted into one of your veins.  The bag of donated blood will be attached to your IV tube. The blood will then enter through your vein.  Your temperature, blood pressure, and pulse will be monitored regularly during the transfusion. This monitoring is done to detect early signs of a transfusion reaction.  If you have any signs or symptoms of a reaction, your transfusion will be stopped and  you may be given medicine.  When the transfusion is complete, your IV tube will be removed.  Pressure may be applied to the IV site for a few minutes.  A bandage (dressing) will be applied. The procedure may vary among health care providers and hospitals. What happens after the procedure?  Your temperature, blood pressure, heart rate, breathing rate, and blood oxygen level will be monitored often.  Your blood may be tested to see how you are responding to the transfusion.  You may be warmed with fluids or blankets to maintain a normal body temperature. Summary  A blood transfusion is a procedure in which you receive donated blood, including plasma, platelets, and red blood cells, through an IV tube.  Your temperature, blood pressure, and pulse will be monitored before, during, and after the transfusion.  Your blood may be tested after the transfusion to see how your body has responded. This information is not intended to replace advice given to you by your health care provider. Make sure you discuss any questions you have with your health care provider. Document Released: 08/16/2000 Document Revised: 05/16/2016 Document Reviewed: 05/16/2016 Elsevier Interactive Patient Education  2017 [employer]  

## 2016-07-28 LAB — ABO/RH: ABO/RH(D): A NEG

## 2016-07-28 LAB — TYPE AND SCREEN
ABO/RH(D): A NEG
Antibody Screen: NEGATIVE
UNIT DIVISION: 0
Unit division: 0

## 2016-07-31 ENCOUNTER — Other Ambulatory Visit (HOSPITAL_BASED_OUTPATIENT_CLINIC_OR_DEPARTMENT_OTHER): Payer: Medicare Other

## 2016-07-31 DIAGNOSIS — C3411 Malignant neoplasm of upper lobe, right bronchus or lung: Secondary | ICD-10-CM | POA: Diagnosis present

## 2016-07-31 DIAGNOSIS — C3491 Malignant neoplasm of unspecified part of right bronchus or lung: Secondary | ICD-10-CM

## 2016-07-31 LAB — COMPREHENSIVE METABOLIC PANEL
ALBUMIN: 3.5 g/dL (ref 3.5–5.0)
ALK PHOS: 174 U/L — AB (ref 40–150)
ALT: 16 U/L (ref 0–55)
ANION GAP: 9 meq/L (ref 3–11)
AST: 12 U/L (ref 5–34)
BILIRUBIN TOTAL: 0.28 mg/dL (ref 0.20–1.20)
BUN: 5.2 mg/dL — ABNORMAL LOW (ref 7.0–26.0)
CALCIUM: 9.6 mg/dL (ref 8.4–10.4)
CO2: 26 mEq/L (ref 22–29)
Chloride: 105 mEq/L (ref 98–109)
Creatinine: 0.9 mg/dL (ref 0.6–1.1)
EGFR: 75 mL/min/{1.73_m2} — AB (ref >90)
Glucose: 96 mg/dl (ref 70–140)
POTASSIUM: 3.9 meq/L (ref 3.5–5.1)
SODIUM: 139 meq/L (ref 136–145)
TOTAL PROTEIN: 7 g/dL (ref 6.4–8.3)

## 2016-07-31 LAB — CBC WITH DIFFERENTIAL/PLATELET
BASO%: 0.4 % (ref 0.0–2.0)
BASOS ABS: 0.1 10*3/uL (ref 0.0–0.1)
EOS ABS: 0.1 10*3/uL (ref 0.0–0.5)
EOS%: 0.5 % (ref 0.0–7.0)
HCT: 32.9 % — ABNORMAL LOW (ref 34.8–46.6)
HEMOGLOBIN: 10.8 g/dL — AB (ref 11.6–15.9)
LYMPH%: 15.9 % (ref 14.0–49.7)
MCH: 31.6 pg (ref 25.1–34.0)
MCHC: 32.8 g/dL (ref 31.5–36.0)
MCV: 96.3 fL (ref 79.5–101.0)
MONO#: 1.9 10*3/uL — AB (ref 0.1–0.9)
MONO%: 12.2 % (ref 0.0–14.0)
NEUT%: 71 % (ref 38.4–76.8)
NEUTROS ABS: 11 10*3/uL — AB (ref 1.5–6.5)
PLATELETS: 40 10*3/uL — AB (ref 145–400)
RBC: 3.42 10*6/uL — ABNORMAL LOW (ref 3.70–5.45)
RDW: 15.4 % — AB (ref 11.2–14.5)
WBC: 15.5 10*3/uL — AB (ref 3.9–10.3)
lymph#: 2.5 10*3/uL (ref 0.9–3.3)

## 2016-08-07 ENCOUNTER — Other Ambulatory Visit (HOSPITAL_BASED_OUTPATIENT_CLINIC_OR_DEPARTMENT_OTHER): Payer: Medicare Other

## 2016-08-07 ENCOUNTER — Telehealth: Payer: Self-pay | Admitting: Internal Medicine

## 2016-08-07 ENCOUNTER — Ambulatory Visit (HOSPITAL_BASED_OUTPATIENT_CLINIC_OR_DEPARTMENT_OTHER): Payer: Medicare Other | Admitting: Internal Medicine

## 2016-08-07 ENCOUNTER — Ambulatory Visit (HOSPITAL_BASED_OUTPATIENT_CLINIC_OR_DEPARTMENT_OTHER): Payer: Medicare Other

## 2016-08-07 ENCOUNTER — Telehealth: Payer: Self-pay | Admitting: *Deleted

## 2016-08-07 ENCOUNTER — Encounter: Payer: Self-pay | Admitting: Internal Medicine

## 2016-08-07 VITALS — BP 133/82 | HR 100 | Temp 98.4°F | Resp 18 | Ht 63.0 in | Wt 123.8 lb

## 2016-08-07 DIAGNOSIS — D6481 Anemia due to antineoplastic chemotherapy: Secondary | ICD-10-CM | POA: Diagnosis not present

## 2016-08-07 DIAGNOSIS — F329 Major depressive disorder, single episode, unspecified: Secondary | ICD-10-CM | POA: Diagnosis not present

## 2016-08-07 DIAGNOSIS — I10 Essential (primary) hypertension: Secondary | ICD-10-CM | POA: Insufficient documentation

## 2016-08-07 DIAGNOSIS — Z5111 Encounter for antineoplastic chemotherapy: Secondary | ICD-10-CM

## 2016-08-07 DIAGNOSIS — C3411 Malignant neoplasm of upper lobe, right bronchus or lung: Secondary | ICD-10-CM

## 2016-08-07 DIAGNOSIS — C3491 Malignant neoplasm of unspecified part of right bronchus or lung: Secondary | ICD-10-CM

## 2016-08-07 DIAGNOSIS — F419 Anxiety disorder, unspecified: Secondary | ICD-10-CM | POA: Insufficient documentation

## 2016-08-07 DIAGNOSIS — F32A Depression, unspecified: Secondary | ICD-10-CM

## 2016-08-07 DIAGNOSIS — T451X5A Adverse effect of antineoplastic and immunosuppressive drugs, initial encounter: Secondary | ICD-10-CM

## 2016-08-07 HISTORY — DX: Depression, unspecified: F32.A

## 2016-08-07 HISTORY — DX: Essential (primary) hypertension: I10

## 2016-08-07 LAB — CBC WITH DIFFERENTIAL/PLATELET
BASO%: 0.2 % (ref 0.0–2.0)
BASOS ABS: 0 10*3/uL (ref 0.0–0.1)
EOS ABS: 0 10*3/uL (ref 0.0–0.5)
EOS%: 0.3 % (ref 0.0–7.0)
HCT: 32.2 % — ABNORMAL LOW (ref 34.8–46.6)
HGB: 10.8 g/dL — ABNORMAL LOW (ref 11.6–15.9)
LYMPH%: 18.4 % (ref 14.0–49.7)
MCH: 32.4 pg (ref 25.1–34.0)
MCHC: 33.6 g/dL (ref 31.5–36.0)
MCV: 96.6 fL (ref 79.5–101.0)
MONO#: 1.2 10*3/uL — AB (ref 0.1–0.9)
MONO%: 10.3 % (ref 0.0–14.0)
NEUT#: 8.1 10*3/uL — ABNORMAL HIGH (ref 1.5–6.5)
NEUT%: 70.8 % (ref 38.4–76.8)
PLATELETS: 439 10*3/uL — AB (ref 145–400)
RBC: 3.34 10*6/uL — AB (ref 3.70–5.45)
RDW: 15.9 % — ABNORMAL HIGH (ref 11.2–14.5)
WBC: 11.5 10*3/uL — AB (ref 3.9–10.3)
lymph#: 2.1 10*3/uL (ref 0.9–3.3)

## 2016-08-07 LAB — COMPREHENSIVE METABOLIC PANEL
ALT: 16 U/L (ref 0–55)
ANION GAP: 9 meq/L (ref 3–11)
AST: 14 U/L (ref 5–34)
Albumin: 3.4 g/dL — ABNORMAL LOW (ref 3.5–5.0)
Alkaline Phosphatase: 149 U/L (ref 40–150)
BILIRUBIN TOTAL: 0.38 mg/dL (ref 0.20–1.20)
BUN: 8.6 mg/dL (ref 7.0–26.0)
CO2: 26 meq/L (ref 22–29)
Calcium: 9.6 mg/dL (ref 8.4–10.4)
Chloride: 102 mEq/L (ref 98–109)
Creatinine: 0.8 mg/dL (ref 0.6–1.1)
EGFR: 82 mL/min/{1.73_m2} — AB (ref >90)
GLUCOSE: 88 mg/dL (ref 70–140)
POTASSIUM: 3.9 meq/L (ref 3.5–5.1)
SODIUM: 137 meq/L (ref 136–145)
Total Protein: 7.5 g/dL (ref 6.4–8.3)

## 2016-08-07 MED ORDER — SODIUM CHLORIDE 0.9 % IV SOLN
Freq: Once | INTRAVENOUS | Status: AC
  Administered 2016-08-07: 11:00:00 via INTRAVENOUS

## 2016-08-07 MED ORDER — SODIUM CHLORIDE 0.9 % IV SOLN
466.0000 mg | Freq: Once | INTRAVENOUS | Status: AC
  Administered 2016-08-07: 470 mg via INTRAVENOUS
  Filled 2016-08-07: qty 47

## 2016-08-07 MED ORDER — DEXAMETHASONE SODIUM PHOSPHATE 10 MG/ML IJ SOLN
INTRAMUSCULAR | Status: AC
  Filled 2016-08-07: qty 1

## 2016-08-07 MED ORDER — DIPHENHYDRAMINE HCL 50 MG/ML IJ SOLN
25.0000 mg | Freq: Once | INTRAMUSCULAR | Status: AC
  Administered 2016-08-07: 25 mg via INTRAVENOUS

## 2016-08-07 MED ORDER — SODIUM CHLORIDE 0.9% FLUSH
10.0000 mL | INTRAVENOUS | Status: DC | PRN
  Administered 2016-08-07: 10 mL
  Filled 2016-08-07: qty 10

## 2016-08-07 MED ORDER — PALONOSETRON HCL INJECTION 0.25 MG/5ML
INTRAVENOUS | Status: AC
  Filled 2016-08-07: qty 5

## 2016-08-07 MED ORDER — PALONOSETRON HCL INJECTION 0.25 MG/5ML
0.2500 mg | Freq: Once | INTRAVENOUS | Status: AC
  Administered 2016-08-07: 0.25 mg via INTRAVENOUS

## 2016-08-07 MED ORDER — DEXAMETHASONE SODIUM PHOSPHATE 10 MG/ML IJ SOLN
10.0000 mg | Freq: Once | INTRAMUSCULAR | Status: AC
  Administered 2016-08-07: 10 mg via INTRAVENOUS

## 2016-08-07 MED ORDER — HEPARIN SOD (PORK) LOCK FLUSH 100 UNIT/ML IV SOLN
500.0000 [IU] | Freq: Once | INTRAVENOUS | Status: AC | PRN
  Administered 2016-08-07: 500 [IU]
  Filled 2016-08-07: qty 5

## 2016-08-07 MED ORDER — SODIUM CHLORIDE 0.9 % IV SOLN
100.0000 mg/m2 | Freq: Once | INTRAVENOUS | Status: AC
  Administered 2016-08-07: 160 mg via INTRAVENOUS
  Filled 2016-08-07: qty 8

## 2016-08-07 MED ORDER — DIPHENHYDRAMINE HCL 50 MG/ML IJ SOLN
INTRAMUSCULAR | Status: AC
  Filled 2016-08-07: qty 1

## 2016-08-07 NOTE — Patient Instructions (Signed)
[facility] Discharge Instructions for Patients Receiving Chemotherapy  Today you received the following chemotherapy agents Carboplatin and Etoposide.  To help prevent nausea and vomiting after your treatment, we encourage you to take your nausea medication.   If you develop nausea and vomiting that is not controlled by your nausea medication, call the clinic.   BELOW ARE SYMPTOMS THAT SHOULD BE REPORTED IMMEDIATELY:  *FEVER GREATER THAN 100.5 F  *CHILLS WITH OR WITHOUT FEVER  NAUSEA AND VOMITING THAT IS NOT CONTROLLED WITH YOUR NAUSEA MEDICATION  *UNUSUAL SHORTNESS OF BREATH  *UNUSUAL BRUISING OR BLEEDING  TENDERNESS IN MOUTH AND THROAT WITH OR WITHOUT PRESENCE OF ULCERS  *URINARY PROBLEMS  *BOWEL PROBLEMS  UNUSUAL RASH Items with * indicate a potential emergency and should be followed up as soon as possible.  Feel free to call the clinic you have any questions or concerns. The clinic phone number is [phone].  Please show the CHEMO ALERT CARD at check-in to the Emergency Department and triage nurse.   

## 2016-08-07 NOTE — Telephone Encounter (Signed)
Per LOS I have scheduled appts and notified the scheduler 

## 2016-08-07 NOTE — Telephone Encounter (Signed)
Appointments scheduled per 12/6 LOS. Patient left during scheduling for infusion appointment. Will pick up calendars and report from infusion.

## 2016-08-07 NOTE — Progress Notes (Signed)
[facility] [phone]   [phone]) [place], [place] [address] [place] [address]  DIAGNOSIS: Extensive stage (T2a, N3, M1 B) small cell lung cancer presented with right upper lobe lung mass, large right anterior mediastinal and supraclavicular lymphadenopathy as well as pancreatic and splenic metastasis diagnosed in September 2017.  PRIOR THERAPY: None  CURRENT THERAPY: Systemic chemotherapy was carboplatin for AUC of 5 on day 1 and etoposide 100 MG/M2 on days 1, 2 and 3 with Neulasta support. Status post 3 cycles.  INTERVAL HISTORY: Debra Kidd 55 y.o. female returns to the clinic today for follow-up visit accompanied by her husband. The patient is feeling well today except for increasing fatigue. She has a lot of aching pain and fatigue after the Neulasta injection. She tolerated the last cycle of the chemotherapy well. She denied having any current chest pain, shortness of breath, cough or hemoptysis. She has no nausea or vomiting. She denied having any significant weight loss or night sweats. The patient has no fever or chills. She had repeat CT scan of the chest, abdomen and pelvis performed recently and she is here for evaluation and discussion of the scan results.  MEDICAL HISTORY: Past Medical History:  Diagnosis Date  . 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 Blood type, Rh positive   . Cancer ([place])   . Chronic pain syndrome   . Encounter for antineoplastic chemotherapy 07/17/2016  . Hyperlipidemia   . Hypertension   . Osteoporosis     ALLERGIES:  is allergic to codeine; motrin [ibuprofen]; thiazide-type diuretics; and vicodin [hydrocodone-acetaminophen].  MEDICATIONS:  Current Outpatient Prescriptions  Medication Sig Dispense Refill  . calcium carbonate (TUMS - DOSED IN MG ELEMENTAL CALCIUM) 500 MG chewable tablet Chew 1 tablet by mouth once a week.    Marland Kitchen KLOR-CON M20 20 MEQ tablet Take 20  mEq by mouth daily.  0  . lidocaine-prilocaine (EMLA) cream Apply 1 application topically as needed. 30 g 0  . LORazepam (ATIVAN) 1 MG tablet Take 1 mg by mouth 2 (two) times daily.    Marland Kitchen losartan (COZAAR) 50 MG tablet Take 50 mg by mouth daily.    . mirtazapine (REMERON) 30 MG tablet Take 1 tablet (30 mg total) by mouth at bedtime. 30 tablet 2  . oxyCODONE-acetaminophen (PERCOCET/ROXICET) 5-325 MG tablet Take 0.5 tablets by mouth every 4 (four) hours as needed for severe pain.    Marland Kitchen Potassium Chloride ER 20 MEQ TBCR Take 40 mEq by mouth daily. 2 tablets 20 mg each every day for 10 days. 20 tablet 0  . prochlorperazine (COMPAZINE) 10 MG tablet Take 1 tablet (10 mg total) by mouth every 6 (six) hours as needed for nausea or vomiting. 30 tablet 0  . traMADol (ULTRAM) 50 MG tablet Take by mouth every 6 (six) hours as needed.     No current facility-administered medications for this visit.     SURGICAL HISTORY:  Past Surgical History:  Procedure Laterality Date  . ABDOMINAL HYSTERECTOMY  1981  . EYE SURGERY     left  . IR GENERIC HISTORICAL  06/03/2016   IR FLUORO GUIDE PORT INSERTION RIGHT 06/03/2016 [facility]INTER[facility]  . IR GENERIC HISTORICAL  06/03/2016   IR US GUIDE VASC ACCESS RIGHT 06/03/2016 [facility]  . SKIN CANCER EXCISION     basal cell carcinoma L side of face    REVIEW OF  SYSTEMS:  Constitutional: positive for fatigue Eyes: negative for icterus, irritation and redness Ears, nose, mouth, throat, and face: negative for epistaxis, hoarseness, sore mouth and sore throat Respiratory: negative for cough, dyspnea on exertion, pleurisy/chest pain and sputum Cardiovascular: negative for dyspnea, orthopnea and palpitations Gastrointestinal: negative for constipation, diarrhea, nausea and vomiting Genitourinary:negative for dysuria, frequency and hematuria Integument/breast: negative for dryness, rash and skin color change Hematologic/lymphatic: negative for bleeding and easy  bruising Musculoskeletal:positive for arthralgias Neurological: negative for headaches, seizures, tremors and vertigo Behavioral/Psych: negative for behavior problems, decreased appetite and depression Endocrine: negative Allergic/Immunologic: negative   PHYSICAL EXAMINATION: General appearance: alert, cooperative, fatigued and no distress Head: Normocephalic, without obvious abnormality, atraumatic Neck: no adenopathy, no JVD, supple, symmetrical, trachea midline and thyroid not enlarged, symmetric, no tenderness/mass/nodules Lymph nodes: Cervical, supraclavicular, and axillary nodes normal. Resp: clear to auscultation bilaterally Back: symmetric, no curvature. ROM normal. No CVA tenderness. Cardio: regular rate and rhythm, S1, S2 normal, no murmur, click, rub or gallop GI: soft, non-tender; bowel sounds normal; no masses,  no organomegaly Extremities: extremities normal, atraumatic, no cyanosis or edema Neurologic: Alert and oriented X 3, normal strength and tone. Normal symmetric reflexes. Normal coordination and gait  ECOG PERFORMANCE STATUS: 1 - Symptomatic but completely ambulatory  Blood pressure 133/82, pulse 100, temperature 98.4 F (36.9 C), resp. rate 18, height '5\' 3"'$  (1.6 m), weight 123 lb 12.8 oz (56.2 kg), SpO2 100 %.  LABORATORY DATA: Lab Results  Component Value Date   WBC 11.5 (H) 08/07/2016   HGB 10.8 (L) 08/07/2016   HCT 32.2 (L) 08/07/2016   MCV 96.6 08/07/2016   PLT 439 (H) 08/07/2016      Chemistry      Component Value Date/Time   NA 137 08/07/2016 0919   K 3.9 08/07/2016 0919   CL 99 (L) 06/03/2016 1000   CO2 26 08/07/2016 0919   BUN 8.6 08/07/2016 0919   CREATININE 0.8 08/07/2016 0919      Component Value Date/Time   CALCIUM 9.6 08/07/2016 0919   ALKPHOS 149 08/07/2016 0919   AST 14 08/07/2016 0919   ALT 16 08/07/2016 0919   BILITOT 0.38 08/07/2016 0919       RADIOGRAPHIC STUDIES: Ct Chest W Contrast  Result Date: 07/24/2016 CLINICAL  DATA:  Small cell lung cancer diagnosis September 2017 ongoing chemotherapy. Short of breath and weakness. EXAM: CT CHEST, ABDOMEN, AND PELVIS WITH CONTRAST TECHNIQUE: Multidetector CT imaging of the chest, abdomen and pelvis was performed following the standard protocol during bolus administration of intravenous contrast. CONTRAST:  116m ISOVUE-300 IOPAMIDOL (ISOVUE-300) INJECTION 61% COMPARISON:  CT chest 04/11/2016 FINDINGS: CT CHEST FINDINGS Cardiovascular: Coronary artery calcification and aortic atherosclerotic calcification. Mediastinum/Nodes: No axillary or supraclavicular lymphadenopathy. No mediastinal hilar lymphadenopathy. Mild haziness within the mediastinal fat along the RIGHT peritracheal nodal station at site of prior bulky adenopathy. No measurable adenopathy remains. Lungs/Pleura: Interval resolution of RIGHT upper lobe nodularity. Linear pleural-parenchymal thickening remains (image 33, series 4). Focus of consolidation in the RIGHT upper lobe measuring 2.2 cm is new from prior (image 39, series 4). LEFT lung clear Musculoskeletal: No aggressive osseous lesion. CT ABDOMEN AND PELVIS FINDINGS Hepatobiliary: No focal hepatic lesion. No biliary ductal dilatation. Gallbladder is normal. Common bile duct is normal. Pancreas: Pancreas is normal. No ductal dilatation. No pancreatic inflammation. Spleen: Normal spleen Adrenals/urinary tract: Adrenal glands and kidneys are normal. The ureters and bladder normal. Stomach/Bowel: Stomach, small bowel, appendix, and cecum are normal. The colon and rectosigmoid colon are  normal. Vascular/Lymphatic: Abdominal aorta is normal caliber with atherosclerotic calcification. There is no retroperitoneal or periportal lymphadenopathy. No pelvic lymphadenopathy. Reproductive: Post hysterectomy anatomy Other: No free fluid. Musculoskeletal: No aggressive osseous lesion. IMPRESSION: Chest Impression: 1. Resolution of RIGHT upper lobe nodularity. 2. Small focus  consolidation RIGHT upper lobe most consistent with infection or inflammation. 3. Complete resolution of bulky mediastinal lymphadenopathy. 4. No disease progression. Abdomen / Pelvis Impression: 1. Resolution of pancreatic lesion and splenic lesions. 2. No abdominal pelvic adenopathy. 3. No evidence of residual or progressive disease in the abdomen pelvis . Electronically Signed   By: Suzy Bouchard M.D.   On: 07/24/2016 17:46   Ct Abdomen Pelvis W Contrast  Result Date: 07/24/2016 CLINICAL DATA:  Small cell lung cancer diagnosis September 2017 ongoing chemotherapy. Short of breath and weakness. EXAM: CT CHEST, ABDOMEN, AND PELVIS WITH CONTRAST TECHNIQUE: Multidetector CT imaging of the chest, abdomen and pelvis was performed following the standard protocol during bolus administration of intravenous contrast. CONTRAST:  176m ISOVUE-300 IOPAMIDOL (ISOVUE-300) INJECTION 61% COMPARISON:  CT chest 04/11/2016 FINDINGS: CT CHEST FINDINGS Cardiovascular: Coronary artery calcification and aortic atherosclerotic calcification. Mediastinum/Nodes: No axillary or supraclavicular lymphadenopathy. No mediastinal hilar lymphadenopathy. Mild haziness within the mediastinal fat along the RIGHT peritracheal nodal station at site of prior bulky adenopathy. No measurable adenopathy remains. Lungs/Pleura: Interval resolution of RIGHT upper lobe nodularity. Linear pleural-parenchymal thickening remains (image 33, series 4). Focus of consolidation in the RIGHT upper lobe measuring 2.2 cm is new from prior (image 39, series 4). LEFT lung clear Musculoskeletal: No aggressive osseous lesion. CT ABDOMEN AND PELVIS FINDINGS Hepatobiliary: No focal hepatic lesion. No biliary ductal dilatation. Gallbladder is normal. Common bile duct is normal. Pancreas: Pancreas is normal. No ductal dilatation. No pancreatic inflammation. Spleen: Normal spleen Adrenals/urinary tract: Adrenal glands and kidneys are normal. The ureters and bladder  normal. Stomach/Bowel: Stomach, small bowel, appendix, and cecum are normal. The colon and rectosigmoid colon are normal. Vascular/Lymphatic: Abdominal aorta is normal caliber with atherosclerotic calcification. There is no retroperitoneal or periportal lymphadenopathy. No pelvic lymphadenopathy. Reproductive: Post hysterectomy anatomy Other: No free fluid. Musculoskeletal: No aggressive osseous lesion. IMPRESSION: Chest Impression: 1. Resolution of RIGHT upper lobe nodularity. 2. Small focus consolidation RIGHT upper lobe most consistent with infection or inflammation. 3. Complete resolution of bulky mediastinal lymphadenopathy. 4. No disease progression. Abdomen / Pelvis Impression: 1. Resolution of pancreatic lesion and splenic lesions. 2. No abdominal pelvic adenopathy. 3. No evidence of residual or progressive disease in the abdomen pelvis . Electronically Signed   By: SSuzy BouchardM.D.   On: 07/24/2016 17:46    ASSESSMENT AND PLAN: This is a very pleasant 55years old white female with: 1) extensive stage small cell lung cancer currently undergoing systemic chemotherapy with carboplatin and etoposide status post 3 cycles. The patient is tolerating her treatment well except for the aching pain and fatigue after the Neulasta injection. She had a recent CT scan of the chest, abdomen and pelvis. I personally and independently reviewed the scan to discuss the results and showed the images to the patient and her husband today. It showed significant improvement in her disease with almost resolution of the right upper lobe as well as the pancreatic and splenic lesions. I recommended for the patient to proceed with cycle #4 today as a scheduled. I also discussed with her consideration of enrollment in the maintenance clinical trial with [facility] after completion of cycle #4. She is interested in the trial and she will see  the clinical research nurse later today for evaluation and eligibility. The patient  would come back for follow-up visit in 3 weeks for evaluation after cycle #4. 2) chemotherapy-induced anemia: We will continue to monitor this for now. We will consider the patient for PRBCs transfusion on as-needed basis. She received 2 units of PRBCs transfusion 2 weeks ago. She was advised to call immediately if she has any concerning symptoms in the interval. 3) Depression: She will continue on Remeron 30 mg by mouth daily at bedtime. 4) anxiety: The patient will continue on Ativan as needed. 5) hypertension: Well-controlled. She will continue on Cozaar.  The patient voices understanding of current disease status and treatment options and is in agreement with the current care plan.  All questions were answered. The patient knows to call the clinic with any problems, questions or concerns. We can certainly see the patient much sooner if necessary.  Disclaimer: This note was dictated with voice recognition software. Similar sounding words can inadvertently be transcribed and may not be corrected upon review.

## 2016-08-08 ENCOUNTER — Ambulatory Visit (HOSPITAL_BASED_OUTPATIENT_CLINIC_OR_DEPARTMENT_OTHER): Payer: Medicare Other

## 2016-08-08 VITALS — BP 148/94 | HR 96 | Temp 98.5°F | Resp 18

## 2016-08-08 DIAGNOSIS — C3491 Malignant neoplasm of unspecified part of right bronchus or lung: Secondary | ICD-10-CM

## 2016-08-08 DIAGNOSIS — C3411 Malignant neoplasm of upper lobe, right bronchus or lung: Secondary | ICD-10-CM | POA: Diagnosis not present

## 2016-08-08 DIAGNOSIS — Z5111 Encounter for antineoplastic chemotherapy: Secondary | ICD-10-CM | POA: Diagnosis not present

## 2016-08-08 MED ORDER — ETOPOSIDE CHEMO INJECTION 1 GM/50ML
100.0000 mg/m2 | Freq: Once | INTRAVENOUS | Status: AC
  Administered 2016-08-08: 160 mg via INTRAVENOUS
  Filled 2016-08-08: qty 8

## 2016-08-08 MED ORDER — SODIUM CHLORIDE 0.9% FLUSH
10.0000 mL | INTRAVENOUS | Status: DC | PRN
  Administered 2016-08-08: 10 mL
  Filled 2016-08-08: qty 10

## 2016-08-08 MED ORDER — DIPHENHYDRAMINE HCL 50 MG/ML IJ SOLN
INTRAMUSCULAR | Status: AC
  Filled 2016-08-08: qty 1

## 2016-08-08 MED ORDER — DEXAMETHASONE SODIUM PHOSPHATE 10 MG/ML IJ SOLN
INTRAMUSCULAR | Status: AC
  Filled 2016-08-08: qty 1

## 2016-08-08 MED ORDER — HEPARIN SOD (PORK) LOCK FLUSH 100 UNIT/ML IV SOLN
500.0000 [IU] | Freq: Once | INTRAVENOUS | Status: AC | PRN
  Administered 2016-08-08: 500 [IU]
  Filled 2016-08-08: qty 5

## 2016-08-08 MED ORDER — DIPHENHYDRAMINE HCL 50 MG/ML IJ SOLN
25.0000 mg | Freq: Once | INTRAMUSCULAR | Status: AC
  Administered 2016-08-08: 25 mg via INTRAVENOUS

## 2016-08-08 MED ORDER — DEXAMETHASONE SODIUM PHOSPHATE 10 MG/ML IJ SOLN
10.0000 mg | Freq: Once | INTRAMUSCULAR | Status: AC
  Administered 2016-08-08: 10 mg via INTRAVENOUS

## 2016-08-08 MED ORDER — SODIUM CHLORIDE 0.9 % IV SOLN
Freq: Once | INTRAVENOUS | Status: AC
  Administered 2016-08-08: 12:00:00 via INTRAVENOUS

## 2016-08-08 NOTE — Patient Instructions (Signed)
[facility] Discharge Instructions for Patients Receiving Chemotherapy  Today you received the following chemotherapy agents:  Etoposide  To help prevent nausea and vomiting after your treatment, we encourage you to take your nausea medication.   If you develop nausea and vomiting that is not controlled by your nausea medication, call the clinic.   BELOW ARE SYMPTOMS THAT SHOULD BE REPORTED IMMEDIATELY:  *FEVER GREATER THAN 100.5 F  *CHILLS WITH OR WITHOUT FEVER  NAUSEA AND VOMITING THAT IS NOT CONTROLLED WITH YOUR NAUSEA MEDICATION  *UNUSUAL SHORTNESS OF BREATH  *UNUSUAL BRUISING OR BLEEDING  TENDERNESS IN MOUTH AND THROAT WITH OR WITHOUT PRESENCE OF ULCERS  *URINARY PROBLEMS  *BOWEL PROBLEMS  UNUSUAL RASH Items with * indicate a potential emergency and should be followed up as soon as possible.  Feel free to call the clinic you have any questions or concerns. The clinic phone number is [phone].  Please show the CHEMO ALERT CARD at check-in to the Emergency Department and triage nurse.   

## 2016-08-09 ENCOUNTER — Ambulatory Visit (HOSPITAL_BASED_OUTPATIENT_CLINIC_OR_DEPARTMENT_OTHER): Payer: Medicare Other

## 2016-08-09 VITALS — BP 123/87 | HR 87 | Temp 98.3°F | Resp 17

## 2016-08-09 DIAGNOSIS — C3411 Malignant neoplasm of upper lobe, right bronchus or lung: Secondary | ICD-10-CM

## 2016-08-09 DIAGNOSIS — Z5111 Encounter for antineoplastic chemotherapy: Secondary | ICD-10-CM | POA: Diagnosis not present

## 2016-08-09 DIAGNOSIS — C3491 Malignant neoplasm of unspecified part of right bronchus or lung: Secondary | ICD-10-CM

## 2016-08-09 MED ORDER — DIPHENHYDRAMINE HCL 50 MG/ML IJ SOLN
25.0000 mg | Freq: Once | INTRAMUSCULAR | Status: AC
  Administered 2016-08-09: 25 mg via INTRAVENOUS

## 2016-08-09 MED ORDER — DIPHENHYDRAMINE HCL 50 MG/ML IJ SOLN
INTRAMUSCULAR | Status: AC
  Filled 2016-08-09: qty 1

## 2016-08-09 MED ORDER — SODIUM CHLORIDE 0.9% FLUSH
10.0000 mL | INTRAVENOUS | Status: DC | PRN
  Administered 2016-08-09: 10 mL
  Filled 2016-08-09: qty 10

## 2016-08-09 MED ORDER — DEXAMETHASONE SODIUM PHOSPHATE 10 MG/ML IJ SOLN
10.0000 mg | Freq: Once | INTRAMUSCULAR | Status: AC
  Administered 2016-08-09: 10 mg via INTRAVENOUS

## 2016-08-09 MED ORDER — HEPARIN SOD (PORK) LOCK FLUSH 100 UNIT/ML IV SOLN
500.0000 [IU] | Freq: Once | INTRAVENOUS | Status: AC | PRN
  Administered 2016-08-09: 500 [IU]
  Filled 2016-08-09: qty 5

## 2016-08-09 MED ORDER — DEXAMETHASONE SODIUM PHOSPHATE 10 MG/ML IJ SOLN
INTRAMUSCULAR | Status: AC
  Filled 2016-08-09: qty 1

## 2016-08-09 MED ORDER — SODIUM CHLORIDE 0.9 % IV SOLN
Freq: Once | INTRAVENOUS | Status: AC
  Administered 2016-08-09: 12:00:00 via INTRAVENOUS

## 2016-08-09 MED ORDER — SODIUM CHLORIDE 0.9 % IV SOLN
100.0000 mg/m2 | Freq: Once | INTRAVENOUS | Status: AC
  Administered 2016-08-09: 160 mg via INTRAVENOUS
  Filled 2016-08-09: qty 8

## 2016-08-09 MED ORDER — PEGFILGRASTIM 6 MG/0.6ML ~~LOC~~ PSKT
6.0000 mg | PREFILLED_SYRINGE | Freq: Once | SUBCUTANEOUS | Status: AC
  Administered 2016-08-09: 6 mg via SUBCUTANEOUS
  Filled 2016-08-09: qty 0.6

## 2016-08-09 NOTE — Patient Instructions (Signed)
[facility] Discharge Instructions for Patients Receiving Chemotherapy  Today you received the following chemotherapy agents:  Etoposide  To help prevent nausea and vomiting after your treatment, we encourage you to take your nausea medication.   If you develop nausea and vomiting that is not controlled by your nausea medication, call the clinic.   BELOW ARE SYMPTOMS THAT SHOULD BE REPORTED IMMEDIATELY:  *FEVER GREATER THAN 100.5 F  *CHILLS WITH OR WITHOUT FEVER  NAUSEA AND VOMITING THAT IS NOT CONTROLLED WITH YOUR NAUSEA MEDICATION  *UNUSUAL SHORTNESS OF BREATH  *UNUSUAL BRUISING OR BLEEDING  TENDERNESS IN MOUTH AND THROAT WITH OR WITHOUT PRESENCE OF ULCERS  *URINARY PROBLEMS  *BOWEL PROBLEMS  UNUSUAL RASH Items with * indicate a potential emergency and should be followed up as soon as possible.  Feel free to call the clinic you have any questions or concerns. The clinic phone number is [phone].  Please show the CHEMO ALERT CARD at check-in to the Emergency Department and triage nurse.   

## 2016-08-10 ENCOUNTER — Ambulatory Visit: Payer: Medicare Other

## 2016-08-14 ENCOUNTER — Other Ambulatory Visit (HOSPITAL_BASED_OUTPATIENT_CLINIC_OR_DEPARTMENT_OTHER): Payer: Medicare Other

## 2016-08-14 DIAGNOSIS — C3491 Malignant neoplasm of unspecified part of right bronchus or lung: Secondary | ICD-10-CM

## 2016-08-14 DIAGNOSIS — C3411 Malignant neoplasm of upper lobe, right bronchus or lung: Secondary | ICD-10-CM

## 2016-08-14 LAB — COMPREHENSIVE METABOLIC PANEL
ALBUMIN: 3.6 g/dL (ref 3.5–5.0)
ALK PHOS: 162 U/L — AB (ref 40–150)
ALT: 59 U/L — ABNORMAL HIGH (ref 0–55)
ANION GAP: 10 meq/L (ref 3–11)
AST: 15 U/L (ref 5–34)
BILIRUBIN TOTAL: 0.86 mg/dL (ref 0.20–1.20)
BUN: 20.9 mg/dL (ref 7.0–26.0)
CO2: 24 mEq/L (ref 22–29)
Calcium: 9.6 mg/dL (ref 8.4–10.4)
Chloride: 101 mEq/L (ref 98–109)
Creatinine: 0.8 mg/dL (ref 0.6–1.1)
EGFR: 88 mL/min/{1.73_m2} — AB (ref >90)
GLUCOSE: 90 mg/dL (ref 70–140)
Potassium: 3.3 mEq/L — ABNORMAL LOW (ref 3.5–5.1)
SODIUM: 136 meq/L (ref 136–145)
TOTAL PROTEIN: 7.1 g/dL (ref 6.4–8.3)

## 2016-08-14 LAB — CBC WITH DIFFERENTIAL/PLATELET
BASO%: 0.6 % (ref 0.0–2.0)
Basophils Absolute: 0.1 10*3/uL (ref 0.0–0.1)
EOS ABS: 0 10*3/uL (ref 0.0–0.5)
EOS%: 0.1 % (ref 0.0–7.0)
HEMATOCRIT: 28.6 % — AB (ref 34.8–46.6)
HEMOGLOBIN: 9.5 g/dL — AB (ref 11.6–15.9)
LYMPH#: 1.4 10*3/uL (ref 0.9–3.3)
LYMPH%: 13.9 % — ABNORMAL LOW (ref 14.0–49.7)
MCH: 32.4 pg (ref 25.1–34.0)
MCHC: 33.3 g/dL (ref 31.5–36.0)
MCV: 97.3 fL (ref 79.5–101.0)
MONO#: 0.1 10*3/uL (ref 0.1–0.9)
MONO%: 0.7 % (ref 0.0–14.0)
NEUT%: 84.7 % — ABNORMAL HIGH (ref 38.4–76.8)
NEUTROS ABS: 8.4 10*3/uL — AB (ref 1.5–6.5)
PLATELETS: 232 10*3/uL (ref 145–400)
RBC: 2.93 10*6/uL — ABNORMAL LOW (ref 3.70–5.45)
RDW: 15.9 % — AB (ref 11.2–14.5)
WBC: 9.9 10*3/uL (ref 3.9–10.3)

## 2016-08-21 ENCOUNTER — Telehealth: Payer: Self-pay | Admitting: Medical Oncology

## 2016-08-21 ENCOUNTER — Other Ambulatory Visit (HOSPITAL_BASED_OUTPATIENT_CLINIC_OR_DEPARTMENT_OTHER): Payer: Medicare Other

## 2016-08-21 ENCOUNTER — Other Ambulatory Visit: Payer: Self-pay | Admitting: Medical Oncology

## 2016-08-21 DIAGNOSIS — C3491 Malignant neoplasm of unspecified part of right bronchus or lung: Secondary | ICD-10-CM

## 2016-08-21 DIAGNOSIS — C3411 Malignant neoplasm of upper lobe, right bronchus or lung: Secondary | ICD-10-CM

## 2016-08-21 DIAGNOSIS — E876 Hypokalemia: Secondary | ICD-10-CM

## 2016-08-21 DIAGNOSIS — R309 Painful micturition, unspecified: Secondary | ICD-10-CM

## 2016-08-21 LAB — COMPREHENSIVE METABOLIC PANEL
ALT: 21 U/L (ref 0–55)
AST: 12 U/L (ref 5–34)
Albumin: 3.4 g/dL — ABNORMAL LOW (ref 3.5–5.0)
Alkaline Phosphatase: 129 U/L (ref 40–150)
Anion Gap: 10 mEq/L (ref 3–11)
BUN: 5.7 mg/dL — ABNORMAL LOW (ref 7.0–26.0)
CO2: 26 meq/L (ref 22–29)
Calcium: 8.8 mg/dL (ref 8.4–10.4)
Chloride: 104 mEq/L (ref 98–109)
Creatinine: 0.8 mg/dL (ref 0.6–1.1)
EGFR: 88 mL/min/{1.73_m2} — AB (ref >90)
GLUCOSE: 100 mg/dL (ref 70–140)
POTASSIUM: 2.9 meq/L — AB (ref 3.5–5.1)
SODIUM: 140 meq/L (ref 136–145)
Total Bilirubin: 0.22 mg/dL (ref 0.20–1.20)
Total Protein: 6.5 g/dL (ref 6.4–8.3)

## 2016-08-21 LAB — CBC WITH DIFFERENTIAL/PLATELET
BASO%: 0.1 % (ref 0.0–2.0)
BASOS ABS: 0 10*3/uL (ref 0.0–0.1)
EOS%: 0.3 % (ref 0.0–7.0)
Eosinophils Absolute: 0 10*3/uL (ref 0.0–0.5)
HCT: 21.6 % — ABNORMAL LOW (ref 34.8–46.6)
HGB: 7.3 g/dL — ABNORMAL LOW (ref 11.6–15.9)
LYMPH%: 34.5 % (ref 14.0–49.7)
MCH: 31.6 pg (ref 25.1–34.0)
MCHC: 33.8 g/dL (ref 31.5–36.0)
MCV: 93.5 fL (ref 79.5–101.0)
MONO#: 1.1 10*3/uL — ABNORMAL HIGH (ref 0.1–0.9)
MONO%: 15.9 % — AB (ref 0.0–14.0)
NEUT#: 3.4 10*3/uL (ref 1.5–6.5)
NEUT%: 49.2 % (ref 38.4–76.8)
NRBC: 0 % (ref 0–0)
Platelets: 15 10*3/uL — ABNORMAL LOW (ref 145–400)
RBC: 2.31 10*6/uL — AB (ref 3.70–5.45)
RDW: 15 % — AB (ref 11.2–14.5)
WBC: 7 10*3/uL (ref 3.9–10.3)
lymph#: 2.4 10*3/uL (ref 0.9–3.3)

## 2016-08-21 LAB — URINALYSIS, MICROSCOPIC - CHCC
BILIRUBIN (URINE): NEGATIVE
Glucose: NEGATIVE mg/dL
Ketones: NEGATIVE mg/dL
Leukocyte Esterase: NEGATIVE
NITRITE: NEGATIVE
Protein: NEGATIVE mg/dL
Specific Gravity, Urine: 1.01 (ref 1.003–1.035)
Urobilinogen, UR: 0.2 mg/dL (ref 0.2–1)
pH: 5 (ref 4.6–8.0)

## 2016-08-21 MED ORDER — POTASSIUM CHLORIDE CRYS ER 20 MEQ PO TBCR: 40.0000 meq | EXTENDED_RELEASE_TABLET | Freq: Every day | ORAL | 0 refills | Status: DC

## 2016-08-21 NOTE — Telephone Encounter (Signed)
Pt notified topick up kdur-She stated she hasn't been taking any.

## 2016-08-22 ENCOUNTER — Telehealth: Payer: Self-pay | Admitting: Medical Oncology

## 2016-08-22 ENCOUNTER — Other Ambulatory Visit: Payer: Self-pay | Admitting: Medical Oncology

## 2016-08-22 DIAGNOSIS — D649 Anemia, unspecified: Secondary | ICD-10-CM

## 2016-08-22 NOTE — Telephone Encounter (Signed)
Pt going to check to see if she can get transportation Friday or sat for blood. Schedule request sent

## 2016-08-23 ENCOUNTER — Ambulatory Visit (HOSPITAL_BASED_OUTPATIENT_CLINIC_OR_DEPARTMENT_OTHER): Payer: Medicare Other

## 2016-08-23 ENCOUNTER — Other Ambulatory Visit: Payer: Medicare Other

## 2016-08-23 DIAGNOSIS — D649 Anemia, unspecified: Secondary | ICD-10-CM

## 2016-08-23 DIAGNOSIS — D6481 Anemia due to antineoplastic chemotherapy: Secondary | ICD-10-CM

## 2016-08-23 LAB — PREPARE RBC (CROSSMATCH)

## 2016-08-23 MED ORDER — SODIUM CHLORIDE 0.9 % IV SOLN
250.0000 mL | Freq: Once | INTRAVENOUS | Status: AC
  Administered 2016-08-23: 250 mL via INTRAVENOUS

## 2016-08-23 MED ORDER — ACETAMINOPHEN 325 MG PO TABS
ORAL_TABLET | ORAL | Status: AC
  Filled 2016-08-23: qty 2

## 2016-08-23 MED ORDER — HEPARIN SOD (PORK) LOCK FLUSH 100 UNIT/ML IV SOLN
500.0000 [IU] | Freq: Every day | INTRAVENOUS | Status: AC | PRN
  Administered 2016-08-23: 500 [IU]
  Filled 2016-08-23: qty 5

## 2016-08-23 MED ORDER — DIPHENHYDRAMINE HCL 25 MG PO CAPS
25.0000 mg | ORAL_CAPSULE | Freq: Once | ORAL | Status: AC
  Administered 2016-08-23: 25 mg via ORAL

## 2016-08-23 MED ORDER — ACETAMINOPHEN 325 MG PO TABS
650.0000 mg | ORAL_TABLET | Freq: Once | ORAL | Status: AC
  Administered 2016-08-23: 650 mg via ORAL

## 2016-08-23 MED ORDER — DIPHENHYDRAMINE HCL 25 MG PO CAPS
ORAL_CAPSULE | ORAL | Status: AC
  Filled 2016-08-23: qty 1

## 2016-08-23 MED ORDER — SODIUM CHLORIDE 0.9% FLUSH
10.0000 mL | INTRAVENOUS | Status: AC | PRN
  Administered 2016-08-23: 10 mL
  Filled 2016-08-23: qty 10

## 2016-08-23 NOTE — Patient Instructions (Signed)
Blood Transfusion A blood transfusion is a procedure in which you are given blood through an IV tube. You may need this procedure because of:  Illness.  Surgery.  Injury.  The blood may come from someone else (a donor). You may also be able to donate blood for yourself (autologous blood donation). The blood given in a transfusion is made up of different types of cells. You may get:  Red blood cells. These carry oxygen to the cells in the body.  White blood cells. These help you fight infections.  Platelets. These help your blood to clot.  Plasma. This is the liquid part of your blood. It helps with fluid imbalances.  If you have a clotting disorder, you may also get other types of blood products. What happens before the procedure?  You will have a blood test to find out your blood type. The test also finds out what type of blood your body will accept and matches it to the donor type.  If you are going to have a planned surgery, you may be able to donate your own blood. This may be done in case you need a transfusion.  If you have had an allergic reaction to a transfusion in the past, you may be given medicine to help prevent a reaction. This medicine may be given to you by mouth or through an IV.  You will have your temperature, blood pressure, and pulse checked.  Follow instructions from your doctor about what you cannot eat or drink.  Ask your doctor about: ? Changing or stopping your regular medicines. This is important if you take diabetes medicines or blood thinners. ? Taking medicines such as aspirin and ibuprofen. These medicines can thin your blood. Do not take these medicines before your procedure if your doctor tells you not to. What happens during the procedure?  An IV tube will be put into one of your veins.  The bag of donated blood will be attached to your IV tube. Then, the blood will enter through your vein.  Your temperature, blood pressure, and pulse will be  checked regularly during the procedure. This is done to find early signs of a transfusion reaction.  If you have any signs or symptoms of a reaction, your transfusion will be stopped. You may also be given medicine.  When the transfusion is done, your IV tube will be taken out.  Pressure may be applied to the IV site for a few minutes.  A bandage (dressing) will be put on the IV site. The procedure may vary among doctors and hospitals. What happens after the procedure?  Your temperature, blood pressure, heart rate, breathing rate, and blood oxygen level will be checked often.  Your blood may be tested to see how you are responding to the transfusion.  You may be warmed with fluids or blankets. This is done to keep the temperature of your body normal. Summary  A blood transfusion is a procedure in which you are given blood through an IV tube.  The blood may come from someone else (a donor). You may also be able to donate blood for yourself.  If you have had an allergic reaction to a transfusion in the past, you may be given medicine to help prevent a reaction. This medicine may be given to you by mouth or through an IV tube.  Your temperature, blood pressure, heart rate, breathing rate, and blood oxygen level will be checked often.  Your blood may be tested to   see how you are responding to the transfusion. This information is not intended to replace advice given to you by your health care provider. Make sure you discuss any questions you have with your health care provider. Document Released: 11/15/2008 Document Revised: 04/12/2016 Document Reviewed: 04/12/2016 Elsevier Interactive Patient Education  2017 Elsevier Inc.  

## 2016-08-23 NOTE — Progress Notes (Signed)
Pt here for 2 units PRBCs.  Dr Julien Nordmann notified of PLT of 15.  Pt states no s/s of bleeding that she has noticed.  Per Dr Julien Nordmann no platelet transfusion needed if patient not having any s/s of bleeding.

## 2016-08-26 LAB — TYPE AND SCREEN
ABO/RH(D): A NEG
Antibody Screen: NEGATIVE
Unit division: 0
Unit division: 0

## 2016-08-28 ENCOUNTER — Telehealth: Payer: Self-pay | Admitting: *Deleted

## 2016-08-28 ENCOUNTER — Ambulatory Visit (HOSPITAL_BASED_OUTPATIENT_CLINIC_OR_DEPARTMENT_OTHER): Payer: Medicare Other | Admitting: Internal Medicine

## 2016-08-28 ENCOUNTER — Encounter: Payer: Self-pay | Admitting: Internal Medicine

## 2016-08-28 ENCOUNTER — Ambulatory Visit (HOSPITAL_BASED_OUTPATIENT_CLINIC_OR_DEPARTMENT_OTHER): Payer: Medicare Other

## 2016-08-28 ENCOUNTER — Telehealth: Payer: Self-pay | Admitting: Internal Medicine

## 2016-08-28 ENCOUNTER — Other Ambulatory Visit (HOSPITAL_BASED_OUTPATIENT_CLINIC_OR_DEPARTMENT_OTHER): Payer: Medicare Other

## 2016-08-28 VITALS — BP 137/84 | HR 100 | Temp 98.3°F | Resp 18 | Ht 63.0 in | Wt 127.3 lb

## 2016-08-28 DIAGNOSIS — Z5111 Encounter for antineoplastic chemotherapy: Secondary | ICD-10-CM

## 2016-08-28 DIAGNOSIS — D61810 Antineoplastic chemotherapy induced pancytopenia: Secondary | ICD-10-CM

## 2016-08-28 DIAGNOSIS — C3411 Malignant neoplasm of upper lobe, right bronchus or lung: Secondary | ICD-10-CM

## 2016-08-28 DIAGNOSIS — C3491 Malignant neoplasm of unspecified part of right bronchus or lung: Secondary | ICD-10-CM

## 2016-08-28 DIAGNOSIS — T451X5A Adverse effect of antineoplastic and immunosuppressive drugs, initial encounter: Secondary | ICD-10-CM

## 2016-08-28 DIAGNOSIS — D6481 Anemia due to antineoplastic chemotherapy: Secondary | ICD-10-CM

## 2016-08-28 LAB — COMPREHENSIVE METABOLIC PANEL
ALBUMIN: 3.4 g/dL — AB (ref 3.5–5.0)
ALT: 14 U/L (ref 0–55)
ANION GAP: 9 meq/L (ref 3–11)
AST: 13 U/L (ref 5–34)
Alkaline Phosphatase: 154 U/L — ABNORMAL HIGH (ref 40–150)
BILIRUBIN TOTAL: 0.29 mg/dL (ref 0.20–1.20)
BUN: 6 mg/dL — AB (ref 7.0–26.0)
CO2: 24 mEq/L (ref 22–29)
CREATININE: 0.8 mg/dL (ref 0.6–1.1)
Calcium: 9.4 mg/dL (ref 8.4–10.4)
Chloride: 105 mEq/L (ref 98–109)
EGFR: 80 mL/min/{1.73_m2} — ABNORMAL LOW (ref >90)
Glucose: 83 mg/dl (ref 70–140)
Potassium: 3.8 mEq/L (ref 3.5–5.1)
SODIUM: 138 meq/L (ref 136–145)
TOTAL PROTEIN: 7.2 g/dL (ref 6.4–8.3)

## 2016-08-28 LAB — CBC WITH DIFFERENTIAL/PLATELET
BASO%: 0.1 % (ref 0.0–2.0)
BASOS ABS: 0 10*3/uL (ref 0.0–0.1)
EOS ABS: 0 10*3/uL (ref 0.0–0.5)
EOS%: 0.1 % (ref 0.0–7.0)
HCT: 35.8 % (ref 34.8–46.6)
HGB: 11.7 g/dL (ref 11.6–15.9)
LYMPH%: 21.8 % (ref 14.0–49.7)
MCH: 30.2 pg (ref 25.1–34.0)
MCHC: 32.7 g/dL (ref 31.5–36.0)
MCV: 92.5 fL (ref 79.5–101.0)
MONO#: 1.2 10*3/uL — ABNORMAL HIGH (ref 0.1–0.9)
MONO%: 12.8 % (ref 0.0–14.0)
NEUT#: 5.9 10*3/uL (ref 1.5–6.5)
NEUT%: 65.2 % (ref 38.4–76.8)
Platelets: 312 10*3/uL (ref 145–400)
RBC: 3.87 10*6/uL (ref 3.70–5.45)
RDW: 17.1 % — ABNORMAL HIGH (ref 11.2–14.5)
WBC: 9 10*3/uL (ref 3.9–10.3)
lymph#: 2 10*3/uL (ref 0.9–3.3)

## 2016-08-28 MED ORDER — DEXAMETHASONE SODIUM PHOSPHATE 10 MG/ML IJ SOLN
10.0000 mg | Freq: Once | INTRAMUSCULAR | Status: AC
  Administered 2016-08-28: 10 mg via INTRAVENOUS

## 2016-08-28 MED ORDER — DIPHENHYDRAMINE HCL 50 MG/ML IJ SOLN
INTRAMUSCULAR | Status: AC
  Filled 2016-08-28: qty 1

## 2016-08-28 MED ORDER — DEXAMETHASONE SODIUM PHOSPHATE 10 MG/ML IJ SOLN
INTRAMUSCULAR | Status: AC
  Filled 2016-08-28: qty 1

## 2016-08-28 MED ORDER — HEPARIN SOD (PORK) LOCK FLUSH 100 UNIT/ML IV SOLN
500.0000 [IU] | Freq: Once | INTRAVENOUS | Status: AC | PRN
  Administered 2016-08-28: 500 [IU]
  Filled 2016-08-28: qty 5

## 2016-08-28 MED ORDER — SODIUM CHLORIDE 0.9 % IV SOLN
466.0000 mg | Freq: Once | INTRAVENOUS | Status: AC
  Administered 2016-08-28: 470 mg via INTRAVENOUS
  Filled 2016-08-28: qty 47

## 2016-08-28 MED ORDER — SODIUM CHLORIDE 0.9 % IV SOLN
100.0000 mg/m2 | Freq: Once | INTRAVENOUS | Status: AC
  Administered 2016-08-28: 160 mg via INTRAVENOUS
  Filled 2016-08-28: qty 8

## 2016-08-28 MED ORDER — DIPHENHYDRAMINE HCL 50 MG/ML IJ SOLN
25.0000 mg | Freq: Once | INTRAMUSCULAR | Status: AC
Start: 2016-08-28 — End: 2016-08-28
  Administered 2016-08-28: 25 mg via INTRAVENOUS

## 2016-08-28 MED ORDER — PALONOSETRON HCL INJECTION 0.25 MG/5ML
INTRAVENOUS | Status: AC
  Filled 2016-08-28: qty 5

## 2016-08-28 MED ORDER — PALONOSETRON HCL INJECTION 0.25 MG/5ML
0.2500 mg | Freq: Once | INTRAVENOUS | Status: AC
  Administered 2016-08-28: 0.25 mg via INTRAVENOUS

## 2016-08-28 MED ORDER — SODIUM CHLORIDE 0.9% FLUSH
10.0000 mL | INTRAVENOUS | Status: DC | PRN
  Administered 2016-08-28: 10 mL
  Filled 2016-08-28: qty 10

## 2016-08-28 MED ORDER — SODIUM CHLORIDE 0.9 % IV SOLN
Freq: Once | INTRAVENOUS | Status: AC
  Administered 2016-08-28: 13:00:00 via INTRAVENOUS

## 2016-08-28 NOTE — Telephone Encounter (Signed)
Appointments scheduled per 12/27 LOS. Patient given AVS report and calendars with future scheduled appointments.

## 2016-08-28 NOTE — Patient Instructions (Signed)
[facility] Discharge Instructions for Patients Receiving Chemotherapy  Today you received the following chemotherapy agents Carboplatin and Etoposide.  To help prevent nausea and vomiting after your treatment, we encourage you to take your nausea medication.   If you develop nausea and vomiting that is not controlled by your nausea medication, call the clinic.   BELOW ARE SYMPTOMS THAT SHOULD BE REPORTED IMMEDIATELY:  *FEVER GREATER THAN 100.5 F  *CHILLS WITH OR WITHOUT FEVER  NAUSEA AND VOMITING THAT IS NOT CONTROLLED WITH YOUR NAUSEA MEDICATION  *UNUSUAL SHORTNESS OF BREATH  *UNUSUAL BRUISING OR BLEEDING  TENDERNESS IN MOUTH AND THROAT WITH OR WITHOUT PRESENCE OF ULCERS  *URINARY PROBLEMS  *BOWEL PROBLEMS  UNUSUAL RASH Items with * indicate a potential emergency and should be followed up as soon as possible.  Feel free to call the clinic you have any questions or concerns. The clinic phone number is [phone].  Please show the CHEMO ALERT CARD at check-in to the Emergency Department and triage nurse.   

## 2016-08-28 NOTE — Progress Notes (Signed)
[place] [phone]   [phone]) [place], MD [address] [place] [address]  DIAGNOSIS: Extensive stage (T2a, N3, M1 B) small cell lung cancer presented with right upper lobe lung mass, large right anterior mediastinal and supraclavicular lymphadenopathy as well as pancreatic and splenic metastasis diagnosed in September 2017.  PRIOR THERAPY: None  CURRENT THERAPY: Systemic chemotherapy was carboplatin for AUC of 5 on day 1 and etoposide 100 MG/M2 on days 1, 2 and 3 with Neulasta support. Status post 4 cycles.  INTERVAL HISTORY: Debra Kidd 55 y.o. female came to the clinic today for follow-up visit accompanied by her husband. The patient tolerated the last cycle of her treatment fairly well with no significant adverse effect except for the chemotherapy-induced pancytopenia which recovered today. She received 2 units of PRBCs transfusion last week for the severe chemotherapy-induced anemia. She denied having any chest pain, shortness of breath, cough or hemoptysis. She has no fever or chills. She has no nausea or vomiting. She was considered for enrollment in a maintenance clinical trial with ROVA-T but the patient has no interest in the trial. She is here today for evaluation before starting cycle #5.  MEDICAL HISTORY: Past Medical History:  Diagnosis Date  . 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 Blood type, Rh positive   . Cancer ([place])   . Chronic pain syndrome   . Depression 08/07/2016  . Encounter for antineoplastic chemotherapy 07/17/2016  . Hyperlipidemia   . Hypertension   . Hypertension 08/07/2016  . Osteoporosis     ALLERGIES:  is allergic to codeine; motrin [ibuprofen]; thiazide-type diuretics; and vicodin [hydrocodone-acetaminophen].  MEDICATIONS:  Current Outpatient Prescriptions  Medication Sig Dispense Refill  . calcium carbonate (TUMS - DOSED IN MG ELEMENTAL CALCIUM) 500 MG  chewable tablet Chew 1 tablet by mouth once a week.    Marland Kitchen KLOR-CON M20 20 MEQ tablet Take 20 mEq by mouth daily.  0  . lidocaine-prilocaine (EMLA) cream Apply 1 application topically as needed. 30 g 0  . LORazepam (ATIVAN) 1 MG tablet Take 1 mg by mouth 2 (two) times daily.    Marland Kitchen losartan (COZAAR) 50 MG tablet Take 50 mg by mouth daily.    . mirtazapine (REMERON) 30 MG tablet Take 1 tablet (30 mg total) by mouth at bedtime. 30 tablet 2  . oxyCODONE-acetaminophen (PERCOCET/ROXICET) 5-325 MG tablet Take 0.5 tablets by mouth every 4 (four) hours as needed for severe pain.    . potassium chloride SA (K-DUR,KLOR-CON) 20 MEQ tablet Take 2 tablets (40 mEq total) by mouth daily. For 7 days 14 tablet 0  . prochlorperazine (COMPAZINE) 10 MG tablet Take 1 tablet (10 mg total) by mouth every 6 (six) hours as needed for nausea or vomiting. 30 tablet 0  . traMADol (ULTRAM) 50 MG tablet Take by mouth every 6 (six) hours as needed.     No current facility-administered medications for this visit.     SURGICAL HISTORY:  Past Surgical History:  Procedure Laterality Date  . ABDOMINAL HYSTERECTOMY  1981  . EYE SURGERY     left  . IR GENERIC HISTORICAL  06/03/2016   IR FLUORO GUIDE PORT INSERTION RIGHT 06/03/2016 [facility]-INTERV RAD  . IR GENERIC HISTORICAL  06/03/2016   IR US GUIDE VASC ACCESS RIGHT 06/03/2016 [facility]INTER[facility]  . SKIN CANCER EXCISION     basal cell carcinoma L side of face  REVIEW OF SYSTEMS:  A comprehensive review of systems was negative except for: Constitutional: positive for fatigue   PHYSICAL EXAMINATION: General appearance: alert, cooperative, fatigued and no distress Head: Normocephalic, without obvious abnormality, atraumatic Neck: no adenopathy, no JVD, supple, symmetrical, trachea midline and thyroid not enlarged, symmetric, no tenderness/mass/nodules Lymph nodes: Cervical, supraclavicular, and axillary nodes normal. Resp: clear to auscultation bilaterally Back: symmetric, no curvature.  ROM normal. No CVA tenderness. Cardio: regular rate and rhythm, S1, S2 normal, no murmur, click, rub or gallop GI: soft, non-tender; bowel sounds normal; no masses,  no organomegaly Extremities: extremities normal, atraumatic, no cyanosis or edema  ECOG PERFORMANCE STATUS: 1 - Symptomatic but completely ambulatory  Blood pressure 137/84, pulse 100, temperature 98.3 F (36.8 C), temperature source Oral, resp. rate 18, height '5\' 3"'$  (1.6 m), weight 127 lb 4.8 oz (57.7 kg), SpO2 100 %.  LABORATORY DATA: Lab Results  Component Value Date   WBC 9.0 08/28/2016   HGB 11.7 08/28/2016   HCT 35.8 08/28/2016   MCV 92.5 08/28/2016   PLT 312 08/28/2016      Chemistry      Component Value Date/Time   NA 138 08/28/2016 1031   K 3.8 08/28/2016 1031   CL 99 (L) 06/03/2016 1000   CO2 24 08/28/2016 1031   BUN 6.0 (L) 08/28/2016 1031   CREATININE 0.8 08/28/2016 1031      Component Value Date/Time   CALCIUM 9.4 08/28/2016 1031   ALKPHOS 154 (H) 08/28/2016 1031   AST 13 08/28/2016 1031   ALT 14 08/28/2016 1031   BILITOT 0.29 08/28/2016 1031       RADIOGRAPHIC STUDIES: No results found.  ASSESSMENT AND PLAN:  This is a very pleasant 55 years old white female with extensive stage small cell lung cancer currently undergoing systemic chemotherapy with carboplatin and etoposide status post 4 cycles. She has been tolerating her treatment except for pancytopenia and fatigue from the chemotherapy-induced anemia. She received 2 units of PRBCs transfusion last week and her hemoglobin and hematocrit are significantly improved. The patient declined participation in the chemotherapy trial with ROVA-T. She will proceed with cycle #5 of her systemic chemotherapy with carbo platinum and etoposide today. She will come back for follow-up visit in 3 weeks for reevaluation before starting cycle #6. She was advised to call immediately if she has any concerning symptoms in the interval. The patient voices  understanding of current disease status and treatment options and is in agreement with the current care plan.  All questions were answered. The patient knows to call the clinic with any problems, questions or concerns. We can certainly see the patient much sooner if necessary. I spent 10 minutes counseling the patient face to face. The total time spent in the appointment was 15 minutes.  Disclaimer: This note was dictated with voice recognition software. Similar sounding words can inadvertently be transcribed and may not be corrected upon review.

## 2016-08-28 NOTE — Telephone Encounter (Signed)
Per LOS I have scheduled appts and notified the scheduler 

## 2016-08-29 ENCOUNTER — Encounter: Payer: Self-pay | Admitting: *Deleted

## 2016-08-29 ENCOUNTER — Ambulatory Visit (HOSPITAL_BASED_OUTPATIENT_CLINIC_OR_DEPARTMENT_OTHER): Payer: Medicare Other

## 2016-08-29 VITALS — BP 137/89 | HR 94 | Temp 98.2°F | Resp 18

## 2016-08-29 DIAGNOSIS — C3411 Malignant neoplasm of upper lobe, right bronchus or lung: Secondary | ICD-10-CM

## 2016-08-29 DIAGNOSIS — C3491 Malignant neoplasm of unspecified part of right bronchus or lung: Secondary | ICD-10-CM

## 2016-08-29 DIAGNOSIS — Z5111 Encounter for antineoplastic chemotherapy: Secondary | ICD-10-CM

## 2016-08-29 MED ORDER — SODIUM CHLORIDE 0.9 % IV SOLN
Freq: Once | INTRAVENOUS | Status: AC
  Administered 2016-08-29: 14:00:00 via INTRAVENOUS

## 2016-08-29 MED ORDER — DEXAMETHASONE SODIUM PHOSPHATE 10 MG/ML IJ SOLN
INTRAMUSCULAR | Status: AC
  Filled 2016-08-29: qty 1

## 2016-08-29 MED ORDER — SODIUM CHLORIDE 0.9 % IV SOLN
100.0000 mg/m2 | Freq: Once | INTRAVENOUS | Status: AC
  Administered 2016-08-29: 160 mg via INTRAVENOUS
  Filled 2016-08-29: qty 8

## 2016-08-29 MED ORDER — DEXAMETHASONE SODIUM PHOSPHATE 10 MG/ML IJ SOLN
10.0000 mg | Freq: Once | INTRAMUSCULAR | Status: AC
  Administered 2016-08-29: 10 mg via INTRAVENOUS

## 2016-08-29 MED ORDER — DIPHENHYDRAMINE HCL 50 MG/ML IJ SOLN
INTRAMUSCULAR | Status: AC
  Filled 2016-08-29: qty 1

## 2016-08-29 MED ORDER — HEPARIN SOD (PORK) LOCK FLUSH 100 UNIT/ML IV SOLN
500.0000 [IU] | Freq: Once | INTRAVENOUS | Status: AC | PRN
  Administered 2016-08-29: 500 [IU]
  Filled 2016-08-29: qty 5

## 2016-08-29 MED ORDER — DIPHENHYDRAMINE HCL 50 MG/ML IJ SOLN
25.0000 mg | Freq: Once | INTRAMUSCULAR | Status: AC
  Administered 2016-08-29: 25 mg via INTRAVENOUS

## 2016-08-29 MED ORDER — SODIUM CHLORIDE 0.9% FLUSH
10.0000 mL | INTRAVENOUS | Status: DC | PRN
  Administered 2016-08-29: 10 mL
  Filled 2016-08-29: qty 10

## 2016-08-29 NOTE — Patient Instructions (Signed)
[facility] Discharge Instructions for Patients Receiving Chemotherapy  Today you received the following chemotherapy agents: Etoposide   To help prevent nausea and vomiting after your treatment, we encourage you to take your nausea medication as directed.    If you develop nausea and vomiting that is not controlled by your nausea medication, call the clinic.   BELOW ARE SYMPTOMS THAT SHOULD BE REPORTED IMMEDIATELY:  *FEVER GREATER THAN 100.5 F  *CHILLS WITH OR WITHOUT FEVER  NAUSEA AND VOMITING THAT IS NOT CONTROLLED WITH YOUR NAUSEA MEDICATION  *UNUSUAL SHORTNESS OF BREATH  *UNUSUAL BRUISING OR BLEEDING  TENDERNESS IN MOUTH AND THROAT WITH OR WITHOUT PRESENCE OF ULCERS  *URINARY PROBLEMS  *BOWEL PROBLEMS  UNUSUAL RASH Items with * indicate a potential emergency and should be followed up as soon as possible.  Feel free to call the clinic you have any questions or concerns. The clinic phone number is [phone].  Please show the CHEMO ALERT CARD at check-in to the Emergency Department and triage nurse.   

## 2016-08-30 ENCOUNTER — Ambulatory Visit (HOSPITAL_BASED_OUTPATIENT_CLINIC_OR_DEPARTMENT_OTHER): Payer: Medicare Other

## 2016-08-30 ENCOUNTER — Ambulatory Visit (HOSPITAL_COMMUNITY)
Admission: RE | Admit: 2016-08-30 | Discharge: 2016-08-30 | Disposition: A | Discharge status: Home / Self Care | Payer: Medicare Other | Source: Ambulatory Visit | Attending: Internal Medicine | Admitting: Internal Medicine

## 2016-08-30 VITALS — BP 154/83 | HR 88 | Temp 98.3°F | Resp 18

## 2016-08-30 DIAGNOSIS — C3491 Malignant neoplasm of unspecified part of right bronchus or lung: Secondary | ICD-10-CM

## 2016-08-30 DIAGNOSIS — Z5111 Encounter for antineoplastic chemotherapy: Secondary | ICD-10-CM

## 2016-08-30 DIAGNOSIS — C3411 Malignant neoplasm of upper lobe, right bronchus or lung: Secondary | ICD-10-CM

## 2016-08-30 MED ORDER — SODIUM CHLORIDE 0.9% FLUSH
10.0000 mL | INTRAVENOUS | Status: DC | PRN
  Administered 2016-08-30: 10 mL
  Filled 2016-08-30: qty 10

## 2016-08-30 MED ORDER — DEXAMETHASONE SODIUM PHOSPHATE 10 MG/ML IJ SOLN
INTRAMUSCULAR | Status: AC
  Filled 2016-08-30: qty 1

## 2016-08-30 MED ORDER — SODIUM CHLORIDE 0.9 % IV SOLN
Freq: Once | INTRAVENOUS | Status: AC
  Administered 2016-08-30: 13:00:00 via INTRAVENOUS

## 2016-08-30 MED ORDER — DEXAMETHASONE SODIUM PHOSPHATE 10 MG/ML IJ SOLN
10.0000 mg | Freq: Once | INTRAMUSCULAR | Status: AC
  Administered 2016-08-30: 10 mg via INTRAVENOUS

## 2016-08-30 MED ORDER — HEPARIN SOD (PORK) LOCK FLUSH 100 UNIT/ML IV SOLN
500.0000 [IU] | Freq: Once | INTRAVENOUS | Status: AC | PRN
  Administered 2016-08-30: 500 [IU]
  Filled 2016-08-30: qty 5

## 2016-08-30 MED ORDER — DIPHENHYDRAMINE HCL 50 MG/ML IJ SOLN
25.0000 mg | Freq: Once | INTRAMUSCULAR | Status: AC
  Administered 2016-08-30: 25 mg via INTRAVENOUS

## 2016-08-30 MED ORDER — SODIUM CHLORIDE 0.9 % IV SOLN
100.0000 mg/m2 | Freq: Once | INTRAVENOUS | Status: AC
  Administered 2016-08-30: 160 mg via INTRAVENOUS
  Filled 2016-08-30: qty 8

## 2016-08-30 MED ORDER — DIPHENHYDRAMINE HCL 50 MG/ML IJ SOLN
INTRAMUSCULAR | Status: AC
  Filled 2016-08-30: qty 1

## 2016-08-30 MED ORDER — PEGFILGRASTIM 6 MG/0.6ML ~~LOC~~ PSKT
6.0000 mg | PREFILLED_SYRINGE | Freq: Once | SUBCUTANEOUS | Status: AC
  Administered 2016-08-30: 6 mg via SUBCUTANEOUS
  Filled 2016-08-30: qty 0.6

## 2016-08-30 NOTE — Patient Instructions (Signed)
[facility] Discharge Instructions for Patients Receiving Chemotherapy  Today you received the following chemotherapy agents: Etoposide   To help prevent nausea and vomiting after your treatment, we encourage you to take your nausea medication as directed.    If you develop nausea and vomiting that is not controlled by your nausea medication, call the clinic.   BELOW ARE SYMPTOMS THAT SHOULD BE REPORTED IMMEDIATELY:  *FEVER GREATER THAN 100.5 F  *CHILLS WITH OR WITHOUT FEVER  NAUSEA AND VOMITING THAT IS NOT CONTROLLED WITH YOUR NAUSEA MEDICATION  *UNUSUAL SHORTNESS OF BREATH  *UNUSUAL BRUISING OR BLEEDING  TENDERNESS IN MOUTH AND THROAT WITH OR WITHOUT PRESENCE OF ULCERS  *URINARY PROBLEMS  *BOWEL PROBLEMS  UNUSUAL RASH Items with * indicate a potential emergency and should be followed up as soon as possible.  Feel free to call the clinic you have any questions or concerns. The clinic phone number is [phone].  Please show the CHEMO ALERT CARD at check-in to the Emergency Department and triage nurse.   

## 2016-09-04 ENCOUNTER — Other Ambulatory Visit (HOSPITAL_BASED_OUTPATIENT_CLINIC_OR_DEPARTMENT_OTHER): Payer: Medicare Other

## 2016-09-04 DIAGNOSIS — C3411 Malignant neoplasm of upper lobe, right bronchus or lung: Secondary | ICD-10-CM | POA: Diagnosis not present

## 2016-09-04 DIAGNOSIS — C3491 Malignant neoplasm of unspecified part of right bronchus or lung: Secondary | ICD-10-CM

## 2016-09-04 LAB — COMPREHENSIVE METABOLIC PANEL
ALBUMIN: 3.9 g/dL (ref 3.5–5.0)
ALK PHOS: 176 U/L — AB (ref 40–150)
ALT: 92 U/L — AB (ref 0–55)
AST: 16 U/L (ref 5–34)
Anion Gap: 10 mEq/L (ref 3–11)
BILIRUBIN TOTAL: 1.06 mg/dL (ref 0.20–1.20)
BUN: 16.5 mg/dL (ref 7.0–26.0)
CO2: 27 mEq/L (ref 22–29)
CREATININE: 0.8 mg/dL (ref 0.6–1.1)
Calcium: 9.6 mg/dL (ref 8.4–10.4)
Chloride: 100 mEq/L (ref 98–109)
EGFR: 85 mL/min/{1.73_m2} — AB (ref >90)
GLUCOSE: 89 mg/dL (ref 70–140)
Potassium: 3.5 mEq/L (ref 3.5–5.1)
SODIUM: 136 meq/L (ref 136–145)
TOTAL PROTEIN: 7.1 g/dL (ref 6.4–8.3)

## 2016-09-04 LAB — CBC WITH DIFFERENTIAL/PLATELET
BASO%: 0.2 % (ref 0.0–2.0)
BASOS ABS: 0 10*3/uL (ref 0.0–0.1)
EOS ABS: 0 10*3/uL (ref 0.0–0.5)
EOS%: 0 % (ref 0.0–7.0)
HCT: 32.7 % — ABNORMAL LOW (ref 34.8–46.6)
HEMOGLOBIN: 10.9 g/dL — AB (ref 11.6–15.9)
LYMPH#: 1.8 10*3/uL (ref 0.9–3.3)
LYMPH%: 20.1 % (ref 14.0–49.7)
MCH: 30.8 pg (ref 25.1–34.0)
MCHC: 33.3 g/dL (ref 31.5–36.0)
MCV: 92.4 fL (ref 79.5–101.0)
MONO#: 0.1 10*3/uL (ref 0.1–0.9)
MONO%: 1.2 % (ref 0.0–14.0)
NEUT%: 78.5 % — ABNORMAL HIGH (ref 38.4–76.8)
NEUTROS ABS: 7 10*3/uL — AB (ref 1.5–6.5)
NRBC: 0 % (ref 0–0)
PLATELETS: 250 10*3/uL (ref 145–400)
RBC: 3.54 10*6/uL — ABNORMAL LOW (ref 3.70–5.45)
RDW: 17.2 % — AB (ref 11.2–14.5)
WBC: 8.9 10*3/uL (ref 3.9–10.3)

## 2016-09-11 ENCOUNTER — Other Ambulatory Visit (HOSPITAL_BASED_OUTPATIENT_CLINIC_OR_DEPARTMENT_OTHER): Payer: Medicare Other

## 2016-09-11 DIAGNOSIS — C3411 Malignant neoplasm of upper lobe, right bronchus or lung: Secondary | ICD-10-CM

## 2016-09-11 DIAGNOSIS — C3491 Malignant neoplasm of unspecified part of right bronchus or lung: Secondary | ICD-10-CM

## 2016-09-11 LAB — CBC WITH DIFFERENTIAL/PLATELET
BASO%: 0.4 % (ref 0.0–2.0)
BASOS ABS: 0.1 10*3/uL (ref 0.0–0.1)
EOS ABS: 0 10*3/uL (ref 0.0–0.5)
EOS%: 0.1 % (ref 0.0–7.0)
HCT: 28.7 % — ABNORMAL LOW (ref 34.8–46.6)
HGB: 9.6 g/dL — ABNORMAL LOW (ref 11.6–15.9)
LYMPH%: 14.2 % (ref 14.0–49.7)
MCH: 30.5 pg (ref 25.1–34.0)
MCHC: 33.3 g/dL (ref 31.5–36.0)
MCV: 91.6 fL (ref 79.5–101.0)
MONO#: 2.4 10*3/uL — ABNORMAL HIGH (ref 0.1–0.9)
MONO%: 16.9 % — AB (ref 0.0–14.0)
NEUT#: 9.6 10*3/uL — ABNORMAL HIGH (ref 1.5–6.5)
NEUT%: 68.4 % (ref 38.4–76.8)
Platelets: 14 10*3/uL — ABNORMAL LOW (ref 145–400)
RBC: 3.14 10*6/uL — AB (ref 3.70–5.45)
RDW: 19 % — ABNORMAL HIGH (ref 11.2–14.5)
WBC: 14 10*3/uL — ABNORMAL HIGH (ref 3.9–10.3)
lymph#: 2 10*3/uL (ref 0.9–3.3)

## 2016-09-11 LAB — COMPREHENSIVE METABOLIC PANEL
ALBUMIN: 3.6 g/dL (ref 3.5–5.0)
ALK PHOS: 153 U/L — AB (ref 40–150)
ALT: 53 U/L (ref 0–55)
AST: 34 U/L (ref 5–34)
Anion Gap: 11 mEq/L (ref 3–11)
BUN: 6 mg/dL — ABNORMAL LOW (ref 7.0–26.0)
CO2: 28 meq/L (ref 22–29)
Calcium: 8.8 mg/dL (ref 8.4–10.4)
Chloride: 101 mEq/L (ref 98–109)
Creatinine: 0.8 mg/dL (ref 0.6–1.1)
EGFR: 79 mL/min/{1.73_m2} — AB (ref >90)
GLUCOSE: 103 mg/dL (ref 70–140)
POTASSIUM: 3.3 meq/L — AB (ref 3.5–5.1)
SODIUM: 139 meq/L (ref 136–145)
Total Bilirubin: 0.24 mg/dL (ref 0.20–1.20)
Total Protein: 6.8 g/dL (ref 6.4–8.3)

## 2016-09-17 ENCOUNTER — Telehealth: Payer: Self-pay | Admitting: *Deleted

## 2016-09-17 NOTE — Telephone Encounter (Signed)
Patient called and canceled appts for this week, moved to next week. Patient aware of appts. Per desk RN cancel all other chemo appts, pt only has care plan in for six cycles

## 2016-09-18 ENCOUNTER — Ambulatory Visit: Payer: Medicare Other

## 2016-09-18 ENCOUNTER — Ambulatory Visit: Payer: Medicare Other | Admitting: Internal Medicine

## 2016-09-18 ENCOUNTER — Other Ambulatory Visit: Payer: Medicare Other

## 2016-09-19 ENCOUNTER — Encounter: Payer: Self-pay | Admitting: *Deleted

## 2016-09-19 ENCOUNTER — Ambulatory Visit: Payer: Medicare Other

## 2016-09-20 ENCOUNTER — Ambulatory Visit: Payer: Medicare Other

## 2016-09-25 ENCOUNTER — Other Ambulatory Visit: Payer: Self-pay | Admitting: Internal Medicine

## 2016-09-25 ENCOUNTER — Telehealth: Payer: Self-pay | Admitting: Internal Medicine

## 2016-09-25 ENCOUNTER — Ambulatory Visit (HOSPITAL_BASED_OUTPATIENT_CLINIC_OR_DEPARTMENT_OTHER): Payer: Medicare Other

## 2016-09-25 ENCOUNTER — Other Ambulatory Visit (HOSPITAL_BASED_OUTPATIENT_CLINIC_OR_DEPARTMENT_OTHER): Payer: Medicare Other

## 2016-09-25 ENCOUNTER — Ambulatory Visit (HOSPITAL_BASED_OUTPATIENT_CLINIC_OR_DEPARTMENT_OTHER): Payer: Medicare Other | Admitting: Nurse Practitioner

## 2016-09-25 ENCOUNTER — Other Ambulatory Visit: Payer: Medicare Other

## 2016-09-25 DIAGNOSIS — R0609 Other forms of dyspnea: Secondary | ICD-10-CM

## 2016-09-25 DIAGNOSIS — C3411 Malignant neoplasm of upper lobe, right bronchus or lung: Secondary | ICD-10-CM

## 2016-09-25 DIAGNOSIS — Z5111 Encounter for antineoplastic chemotherapy: Secondary | ICD-10-CM

## 2016-09-25 DIAGNOSIS — D6481 Anemia due to antineoplastic chemotherapy: Secondary | ICD-10-CM | POA: Diagnosis not present

## 2016-09-25 DIAGNOSIS — C3491 Malignant neoplasm of unspecified part of right bronchus or lung: Secondary | ICD-10-CM

## 2016-09-25 DIAGNOSIS — Z23 Encounter for immunization: Secondary | ICD-10-CM

## 2016-09-25 LAB — CBC WITH DIFFERENTIAL/PLATELET
BASO%: 0.3 % (ref 0.0–2.0)
Basophils Absolute: 0 10*3/uL (ref 0.0–0.1)
EOS ABS: 0 10*3/uL (ref 0.0–0.5)
EOS%: 0.3 % (ref 0.0–7.0)
HCT: 27.6 % — ABNORMAL LOW (ref 34.8–46.6)
HEMOGLOBIN: 9.4 g/dL — AB (ref 11.6–15.9)
LYMPH%: 27.9 % (ref 14.0–49.7)
MCH: 32.4 pg (ref 25.1–34.0)
MCHC: 34.1 g/dL (ref 31.5–36.0)
MCV: 95 fL (ref 79.5–101.0)
MONO#: 1 10*3/uL — ABNORMAL HIGH (ref 0.1–0.9)
MONO%: 12.5 % (ref 0.0–14.0)
NEUT%: 59 % (ref 38.4–76.8)
NEUTROS ABS: 4.5 10*3/uL (ref 1.5–6.5)
Platelets: 295 10*3/uL (ref 145–400)
RBC: 2.9 10*6/uL — AB (ref 3.70–5.45)
RDW: 22.5 % — ABNORMAL HIGH (ref 11.2–14.5)
WBC: 7.7 10*3/uL (ref 3.9–10.3)
lymph#: 2.1 10*3/uL (ref 0.9–3.3)

## 2016-09-25 LAB — COMPREHENSIVE METABOLIC PANEL
ALBUMIN: 3.3 g/dL — AB (ref 3.5–5.0)
ALK PHOS: 151 U/L — AB (ref 40–150)
ALT: 20 U/L (ref 0–55)
AST: 17 U/L (ref 5–34)
Anion Gap: 9 mEq/L (ref 3–11)
BILIRUBIN TOTAL: 0.33 mg/dL (ref 0.20–1.20)
BUN: 4.7 mg/dL — AB (ref 7.0–26.0)
CO2: 27 meq/L (ref 22–29)
Calcium: 9.1 mg/dL (ref 8.4–10.4)
Chloride: 103 mEq/L (ref 98–109)
Creatinine: 0.8 mg/dL (ref 0.6–1.1)
EGFR: 84 mL/min/{1.73_m2} — AB (ref >90)
GLUCOSE: 87 mg/dL (ref 70–140)
Potassium: 3.8 mEq/L (ref 3.5–5.1)
SODIUM: 139 meq/L (ref 136–145)
TOTAL PROTEIN: 7.2 g/dL (ref 6.4–8.3)

## 2016-09-25 MED ORDER — SODIUM CHLORIDE 0.9 % IV SOLN
Freq: Once | INTRAVENOUS | Status: AC
  Administered 2016-09-25: 14:00:00 via INTRAVENOUS

## 2016-09-25 MED ORDER — SODIUM CHLORIDE 0.9 % IV SOLN
466.0000 mg | Freq: Once | INTRAVENOUS | Status: AC
  Administered 2016-09-25: 470 mg via INTRAVENOUS
  Filled 2016-09-25: qty 47

## 2016-09-25 MED ORDER — PALONOSETRON HCL INJECTION 0.25 MG/5ML
0.2500 mg | Freq: Once | INTRAVENOUS | Status: AC
  Administered 2016-09-25: 0.25 mg via INTRAVENOUS

## 2016-09-25 MED ORDER — PROCHLORPERAZINE MALEATE 10 MG PO TABS: 10.0000 mg | ORAL_TABLET | Freq: Four times a day (QID) | ORAL | 0 refills | Status: DC | PRN

## 2016-09-25 MED ORDER — PALONOSETRON HCL INJECTION 0.25 MG/5ML
INTRAVENOUS | Status: AC
  Filled 2016-09-25: qty 5

## 2016-09-25 MED ORDER — SODIUM CHLORIDE 0.9% FLUSH
10.0000 mL | INTRAVENOUS | Status: DC | PRN
  Administered 2016-09-25: 10 mL
  Filled 2016-09-25: qty 10

## 2016-09-25 MED ORDER — DEXAMETHASONE SODIUM PHOSPHATE 10 MG/ML IJ SOLN
INTRAMUSCULAR | Status: AC
  Filled 2016-09-25: qty 1

## 2016-09-25 MED ORDER — HEPARIN SOD (PORK) LOCK FLUSH 100 UNIT/ML IV SOLN
500.0000 [IU] | Freq: Once | INTRAVENOUS | Status: AC | PRN
  Administered 2016-09-25: 500 [IU]
  Filled 2016-09-25: qty 5

## 2016-09-25 MED ORDER — DIPHENHYDRAMINE HCL 50 MG/ML IJ SOLN
INTRAMUSCULAR | Status: AC
  Filled 2016-09-25: qty 1

## 2016-09-25 MED ORDER — DEXAMETHASONE SODIUM PHOSPHATE 10 MG/ML IJ SOLN
10.0000 mg | Freq: Once | INTRAMUSCULAR | Status: AC
  Administered 2016-09-25: 10 mg via INTRAVENOUS

## 2016-09-25 MED ORDER — SODIUM CHLORIDE 0.9 % IV SOLN
100.0000 mg/m2 | Freq: Once | INTRAVENOUS | Status: AC
  Administered 2016-09-25: 160 mg via INTRAVENOUS
  Filled 2016-09-25: qty 8

## 2016-09-25 MED ORDER — DIPHENHYDRAMINE HCL 50 MG/ML IJ SOLN
25.0000 mg | Freq: Once | INTRAMUSCULAR | Status: AC
  Administered 2016-09-25: 25 mg via INTRAVENOUS

## 2016-09-25 MED FILL — PROCHLORPERAZINE 10 MG TAB: 10 | 7 days supply | Qty: 30 | Fill #0

## 2016-09-25 NOTE — Telephone Encounter (Signed)
Appointments scheduled per 09/25/16 los. Patient was given a copy of the appointment schedule and AVS report, per 09/25/16 los.

## 2016-09-25 NOTE — Patient Instructions (Signed)
[facility] Discharge Instructions for Patients Receiving Chemotherapy  Today you received the following chemotherapy agents Carboplatin and Etoposide  To help prevent nausea and vomiting after your treatment, we encourage you to take your nausea medication as directed If you develop nausea and vomiting that is not controlled by your nausea medication, call the clinic.   BELOW ARE SYMPTOMS THAT SHOULD BE REPORTED IMMEDIATELY:  *FEVER GREATER THAN 100.5 F  *CHILLS WITH OR WITHOUT FEVER  NAUSEA AND VOMITING THAT IS NOT CONTROLLED WITH YOUR NAUSEA MEDICATION  *UNUSUAL SHORTNESS OF BREATH  *UNUSUAL BRUISING OR BLEEDING  TENDERNESS IN MOUTH AND THROAT WITH OR WITHOUT PRESENCE OF ULCERS  *URINARY PROBLEMS  *BOWEL PROBLEMS  UNUSUAL RASH Items with * indicate a potential emergency and should be followed up as soon as possible.  Feel free to call the clinic you have any questions or concerns. The clinic phone number is [phone].  Please show the CHEMO ALERT CARD at check-in to the Emergency Department and triage nurse.   

## 2016-09-25 NOTE — Progress Notes (Signed)
  [place] OFFICE PROGRESS NOTE    DIAGNOSIS: Extensive stage (T2a, N3, M1 B) small cell lung cancer presented with right upper lobe lung mass, large right anterior mediastinal and supraclavicular lymphadenopathy as well as pancreatic and splenic metastasis diagnosed in September 2017.  PRIOR THERAPY: None  CURRENT THERAPY: Systemic chemotherapy with carboplatin for AUC of 5 on day 1 and etoposide 100 MG/M2 on days 1, 2 and 3 with Neulasta support. Status post 5 cycles.  INTERVAL HISTORY:   Debra Kidd returns for follow-up. She completed cycle 5 Carboplatin/etoposide beginning 08/28/2016. She canceled her appointments last week due to the weather. She is feeling well. She has mild nausea with the chemotherapy. The nausea is relieved with Compazine. No mouth sores. No diarrhea. She describes her appetite as "wonderful". She denies pain. She has stable mild dyspnea on exertion.  Objective:  Vital signs in last 24 hours:  Blood pressure 127/88, pulse 100, temperature 98 F (36.7 C), temperature source Oral, resp. rate 18, height '5\' 3"'$  (1.6 m), weight 128 lb 3.2 oz (58.2 kg), SpO2 100 %.    HEENT: No thrush or ulcers. Resp: Lungs clear bilaterally. Cardio: Regular rate and rhythm. GI: Abdomen soft and nontender.No hepatomegaly. Vascular: No leg edema. Calves soft and nontender. Neuro: Alert and oriented.  Skin: No rash. Port-A-Cath without erythema.    Lab Results:  Lab Results  Component Value Date   WBC 7.7 09/25/2016   HGB 9.4 (L) 09/25/2016   HCT 27.6 (L) 09/25/2016   MCV 95.0 09/25/2016   PLT 295 09/25/2016   NEUTROABS 4.5 09/25/2016    Imaging:  No results found.  Medications: I have reviewed the patient's current medications.  Assessment/Plan: 1. Extensive stage small cell lung cancer status post 5 cycles of carboplatin/etoposide. 2. Anemia secondary to chemotherapy. Blood transfusion 08/23/2016.   Disposition:Debra Kidd appears stable.  She has completed 5 cycles of carboplatin/etoposide. Plan to proceed with the sixth and final cycle today as scheduled. Dr. Julien Nordmann recommends restaging CT scans in approximately one month. She will return for a follow-up visit on 10/23/2016 to review the results. She will contact the office in the interim with any problems.  Plan reviewed with Dr. Julien Nordmann.    Ned Card ANP/GNP-BC   09/25/2016  12:54 PM

## 2016-09-26 ENCOUNTER — Ambulatory Visit (HOSPITAL_BASED_OUTPATIENT_CLINIC_OR_DEPARTMENT_OTHER): Payer: Medicare Other

## 2016-09-26 VITALS — BP 124/80 | HR 95 | Temp 98.1°F | Resp 16

## 2016-09-26 DIAGNOSIS — C3491 Malignant neoplasm of unspecified part of right bronchus or lung: Secondary | ICD-10-CM

## 2016-09-26 DIAGNOSIS — Z5111 Encounter for antineoplastic chemotherapy: Secondary | ICD-10-CM | POA: Diagnosis present

## 2016-09-26 DIAGNOSIS — C3411 Malignant neoplasm of upper lobe, right bronchus or lung: Secondary | ICD-10-CM | POA: Diagnosis not present

## 2016-09-26 MED ORDER — HEPARIN SOD (PORK) LOCK FLUSH 100 UNIT/ML IV SOLN
500.0000 [IU] | Freq: Once | INTRAVENOUS | Status: AC | PRN
  Administered 2016-09-26: 500 [IU]
  Filled 2016-09-26: qty 5

## 2016-09-26 MED ORDER — SODIUM CHLORIDE 0.9 % IV SOLN
Freq: Once | INTRAVENOUS | Status: AC
  Administered 2016-09-26: 15:00:00 via INTRAVENOUS

## 2016-09-26 MED ORDER — DEXAMETHASONE SODIUM PHOSPHATE 10 MG/ML IJ SOLN
10.0000 mg | Freq: Once | INTRAMUSCULAR | Status: AC
  Administered 2016-09-26: 10 mg via INTRAVENOUS

## 2016-09-26 MED ORDER — SODIUM CHLORIDE 0.9 % IV SOLN
100.0000 mg/m2 | Freq: Once | INTRAVENOUS | Status: AC
  Administered 2016-09-26: 160 mg via INTRAVENOUS
  Filled 2016-09-26: qty 8

## 2016-09-26 MED ORDER — DIPHENHYDRAMINE HCL 50 MG/ML IJ SOLN
25.0000 mg | Freq: Once | INTRAMUSCULAR | Status: AC
  Administered 2016-09-26: 25 mg via INTRAVENOUS

## 2016-09-26 MED ORDER — DIPHENHYDRAMINE HCL 50 MG/ML IJ SOLN
INTRAMUSCULAR | Status: AC
  Filled 2016-09-26: qty 1

## 2016-09-26 MED ORDER — SODIUM CHLORIDE 0.9% FLUSH
10.0000 mL | INTRAVENOUS | Status: DC | PRN
  Administered 2016-09-26: 10 mL
  Filled 2016-09-26: qty 10

## 2016-09-26 MED ORDER — DEXAMETHASONE SODIUM PHOSPHATE 10 MG/ML IJ SOLN
INTRAMUSCULAR | Status: AC
  Filled 2016-09-26: qty 1

## 2016-09-26 NOTE — Patient Instructions (Signed)
[facility] Discharge Instructions for Patients Receiving Chemotherapy  Today you received the following chemotherapy agents: Etoposide   To help prevent nausea and vomiting after your treatment, we encourage you to take your nausea medication as directed.    If you develop nausea and vomiting that is not controlled by your nausea medication, call the clinic.   BELOW ARE SYMPTOMS THAT SHOULD BE REPORTED IMMEDIATELY:  *FEVER GREATER THAN 100.5 F  *CHILLS WITH OR WITHOUT FEVER  NAUSEA AND VOMITING THAT IS NOT CONTROLLED WITH YOUR NAUSEA MEDICATION  *UNUSUAL SHORTNESS OF BREATH  *UNUSUAL BRUISING OR BLEEDING  TENDERNESS IN MOUTH AND THROAT WITH OR WITHOUT PRESENCE OF ULCERS  *URINARY PROBLEMS  *BOWEL PROBLEMS  UNUSUAL RASH Items with * indicate a potential emergency and should be followed up as soon as possible.  Feel free to call the clinic you have any questions or concerns. The clinic phone number is [phone].  Please show the CHEMO ALERT CARD at check-in to the Emergency Department and triage nurse.   

## 2016-09-27 ENCOUNTER — Ambulatory Visit (HOSPITAL_BASED_OUTPATIENT_CLINIC_OR_DEPARTMENT_OTHER): Payer: Medicare Other

## 2016-09-27 VITALS — BP 131/82 | HR 92 | Temp 99.1°F | Resp 18

## 2016-09-27 DIAGNOSIS — C3491 Malignant neoplasm of unspecified part of right bronchus or lung: Secondary | ICD-10-CM

## 2016-09-27 DIAGNOSIS — C3411 Malignant neoplasm of upper lobe, right bronchus or lung: Secondary | ICD-10-CM

## 2016-09-27 DIAGNOSIS — Z5111 Encounter for antineoplastic chemotherapy: Secondary | ICD-10-CM

## 2016-09-27 MED ORDER — PEGFILGRASTIM 6 MG/0.6ML ~~LOC~~ PSKT
6.0000 mg | PREFILLED_SYRINGE | Freq: Once | SUBCUTANEOUS | Status: AC
  Administered 2016-09-27: 6 mg via SUBCUTANEOUS
  Filled 2016-09-27: qty 0.6

## 2016-09-27 MED ORDER — HEPARIN SOD (PORK) LOCK FLUSH 100 UNIT/ML IV SOLN
500.0000 [IU] | Freq: Once | INTRAVENOUS | Status: AC | PRN
  Administered 2016-09-27: 500 [IU]
  Filled 2016-09-27: qty 5

## 2016-09-27 MED ORDER — DIPHENHYDRAMINE HCL 50 MG/ML IJ SOLN
25.0000 mg | Freq: Once | INTRAMUSCULAR | Status: AC
  Administered 2016-09-27: 25 mg via INTRAVENOUS

## 2016-09-27 MED ORDER — SODIUM CHLORIDE 0.9 % IV SOLN
Freq: Once | INTRAVENOUS | Status: AC
  Administered 2016-09-27: 14:00:00 via INTRAVENOUS

## 2016-09-27 MED ORDER — SODIUM CHLORIDE 0.9 % IV SOLN
100.0000 mg/m2 | Freq: Once | INTRAVENOUS | Status: AC
  Administered 2016-09-27: 160 mg via INTRAVENOUS
  Filled 2016-09-27: qty 8

## 2016-09-27 MED ORDER — DEXAMETHASONE SODIUM PHOSPHATE 10 MG/ML IJ SOLN
INTRAMUSCULAR | Status: AC
  Filled 2016-09-27: qty 1

## 2016-09-27 MED ORDER — DIPHENHYDRAMINE HCL 50 MG/ML IJ SOLN
INTRAMUSCULAR | Status: AC
  Filled 2016-09-27: qty 1

## 2016-09-27 MED ORDER — SODIUM CHLORIDE 0.9% FLUSH
10.0000 mL | INTRAVENOUS | Status: DC | PRN
  Administered 2016-09-27: 10 mL
  Filled 2016-09-27: qty 10

## 2016-09-27 MED ORDER — DEXAMETHASONE SODIUM PHOSPHATE 10 MG/ML IJ SOLN
10.0000 mg | Freq: Once | INTRAMUSCULAR | Status: AC
  Administered 2016-09-27: 10 mg via INTRAVENOUS

## 2016-10-02 ENCOUNTER — Other Ambulatory Visit (HOSPITAL_BASED_OUTPATIENT_CLINIC_OR_DEPARTMENT_OTHER): Payer: Medicare Other

## 2016-10-02 DIAGNOSIS — C3491 Malignant neoplasm of unspecified part of right bronchus or lung: Secondary | ICD-10-CM

## 2016-10-02 DIAGNOSIS — C3411 Malignant neoplasm of upper lobe, right bronchus or lung: Secondary | ICD-10-CM

## 2016-10-02 LAB — COMPREHENSIVE METABOLIC PANEL
ALBUMIN: 3.7 g/dL (ref 3.5–5.0)
ALK PHOS: 184 U/L — AB (ref 40–150)
ALT: 54 U/L (ref 0–55)
AST: 33 U/L (ref 5–34)
Anion Gap: 10 mEq/L (ref 3–11)
BILIRUBIN TOTAL: 1.07 mg/dL (ref 0.20–1.20)
BUN: 18.2 mg/dL (ref 7.0–26.0)
CALCIUM: 9.5 mg/dL (ref 8.4–10.4)
CO2: 28 mEq/L (ref 22–29)
CREATININE: 0.8 mg/dL (ref 0.6–1.1)
Chloride: 100 mEq/L (ref 98–109)
EGFR: 88 mL/min/{1.73_m2} — ABNORMAL LOW (ref >90)
Glucose: 92 mg/dl (ref 70–140)
Potassium: 3.3 mEq/L — ABNORMAL LOW (ref 3.5–5.1)
Sodium: 138 mEq/L (ref 136–145)
TOTAL PROTEIN: 7.3 g/dL (ref 6.4–8.3)

## 2016-10-02 LAB — CBC WITH DIFFERENTIAL/PLATELET
BASO%: 0.3 % (ref 0.0–2.0)
BASOS ABS: 0 10*3/uL (ref 0.0–0.1)
EOS%: 0.3 % (ref 0.0–7.0)
Eosinophils Absolute: 0 10*3/uL (ref 0.0–0.5)
HEMATOCRIT: 27.3 % — AB (ref 34.8–46.6)
HEMOGLOBIN: 8.9 g/dL — AB (ref 11.6–15.9)
LYMPH#: 1.8 10*3/uL (ref 0.9–3.3)
LYMPH%: 16.1 % (ref 14.0–49.7)
MCH: 32 pg (ref 25.1–34.0)
MCHC: 32.6 g/dL (ref 31.5–36.0)
MCV: 98.2 fL (ref 79.5–101.0)
MONO#: 0.1 10*3/uL (ref 0.1–0.9)
MONO%: 0.6 % (ref 0.0–14.0)
NEUT#: 9 10*3/uL — ABNORMAL HIGH (ref 1.5–6.5)
NEUT%: 82.7 % — ABNORMAL HIGH (ref 38.4–76.8)
Platelets: 89 10*3/uL — ABNORMAL LOW (ref 145–400)
RBC: 2.78 10*6/uL — ABNORMAL LOW (ref 3.70–5.45)
RDW: 19.8 % — AB (ref 11.2–14.5)
WBC: 10.9 10*3/uL — ABNORMAL HIGH (ref 3.9–10.3)

## 2016-10-09 ENCOUNTER — Other Ambulatory Visit (HOSPITAL_BASED_OUTPATIENT_CLINIC_OR_DEPARTMENT_OTHER): Payer: Medicare Other

## 2016-10-09 ENCOUNTER — Ambulatory Visit: Payer: Medicare Other

## 2016-10-09 ENCOUNTER — Ambulatory Visit (HOSPITAL_BASED_OUTPATIENT_CLINIC_OR_DEPARTMENT_OTHER): Payer: Medicare Other | Admitting: Nurse Practitioner

## 2016-10-09 ENCOUNTER — Other Ambulatory Visit: Payer: Self-pay | Admitting: Medical Oncology

## 2016-10-09 ENCOUNTER — Telehealth: Payer: Self-pay | Admitting: Medical Oncology

## 2016-10-09 ENCOUNTER — Ambulatory Visit: Payer: Medicare Other | Admitting: Internal Medicine

## 2016-10-09 ENCOUNTER — Other Ambulatory Visit: Payer: Self-pay | Admitting: Internal Medicine

## 2016-10-09 ENCOUNTER — Ambulatory Visit (HOSPITAL_COMMUNITY)
Admission: RE | Admit: 2016-10-09 | Discharge: 2016-10-09 | Disposition: A | Discharge status: Home / Self Care | Payer: Medicare Other | Source: Ambulatory Visit | Attending: Internal Medicine | Admitting: Internal Medicine

## 2016-10-09 ENCOUNTER — Ambulatory Visit (HOSPITAL_BASED_OUTPATIENT_CLINIC_OR_DEPARTMENT_OTHER): Payer: Medicare Other

## 2016-10-09 DIAGNOSIS — C3491 Malignant neoplasm of unspecified part of right bronchus or lung: Secondary | ICD-10-CM | POA: Diagnosis not present

## 2016-10-09 DIAGNOSIS — D649 Anemia, unspecified: Secondary | ICD-10-CM

## 2016-10-09 DIAGNOSIS — R609 Edema, unspecified: Secondary | ICD-10-CM | POA: Diagnosis not present

## 2016-10-09 DIAGNOSIS — T451X5A Adverse effect of antineoplastic and immunosuppressive drugs, initial encounter: Secondary | ICD-10-CM

## 2016-10-09 DIAGNOSIS — D6481 Anemia due to antineoplastic chemotherapy: Secondary | ICD-10-CM | POA: Diagnosis not present

## 2016-10-09 LAB — COMPREHENSIVE METABOLIC PANEL
ALBUMIN: 3.5 g/dL (ref 3.5–5.0)
ALK PHOS: 139 U/L (ref 40–150)
ALT: 23 U/L (ref 0–55)
ANION GAP: 14 meq/L — AB (ref 3–11)
AST: 12 U/L (ref 5–34)
BUN: 6.8 mg/dL — AB (ref 7.0–26.0)
CALCIUM: 8.8 mg/dL (ref 8.4–10.4)
CHLORIDE: 101 meq/L (ref 98–109)
CO2: 23 mEq/L (ref 22–29)
CREATININE: 0.8 mg/dL (ref 0.6–1.1)
EGFR: 84 mL/min/{1.73_m2} — ABNORMAL LOW (ref >90)
Glucose: 130 mg/dl (ref 70–140)
POTASSIUM: 2.6 meq/L — AB (ref 3.5–5.1)
Sodium: 139 mEq/L (ref 136–145)
Total Bilirubin: 0.3 mg/dL (ref 0.20–1.20)
Total Protein: 6.6 g/dL (ref 6.4–8.3)

## 2016-10-09 LAB — CBC WITH DIFFERENTIAL/PLATELET
BASO%: 0.2 % (ref 0.0–2.0)
BASOS ABS: 0 10*3/uL (ref 0.0–0.1)
EOS ABS: 0 10*3/uL (ref 0.0–0.5)
EOS%: 0.7 % (ref 0.0–7.0)
HCT: 20.1 % — ABNORMAL LOW (ref 34.8–46.6)
HEMOGLOBIN: 6.5 g/dL — AB (ref 11.6–15.9)
LYMPH#: 2.3 10*3/uL (ref 0.9–3.3)
LYMPH%: 38 % (ref 14.0–49.7)
MCH: 31.4 pg (ref 25.1–34.0)
MCHC: 32.3 g/dL (ref 31.5–36.0)
MCV: 97.1 fL (ref 79.5–101.0)
MONO#: 0.7 10*3/uL (ref 0.1–0.9)
MONO%: 12.4 % (ref 0.0–14.0)
NEUT#: 2.9 10*3/uL (ref 1.5–6.5)
NEUT%: 48.7 % (ref 38.4–76.8)
PLATELETS: 3 10*3/uL — AB (ref 145–400)
RBC: 2.07 10*6/uL — ABNORMAL LOW (ref 3.70–5.45)
RDW: 18.2 % — ABNORMAL HIGH (ref 11.2–14.5)
WBC: 6 10*3/uL (ref 3.9–10.3)
nRBC: 0 % (ref 0–0)

## 2016-10-09 LAB — PREPARE RBC (CROSSMATCH)

## 2016-10-09 LAB — TECHNOLOGIST REVIEW

## 2016-10-09 MED ORDER — DIPHENHYDRAMINE HCL 25 MG PO CAPS
25.0000 mg | ORAL_CAPSULE | Freq: Once | ORAL | Status: AC
  Administered 2016-10-09: 25 mg via ORAL

## 2016-10-09 MED ORDER — SODIUM CHLORIDE 0.9 % IV SOLN
250.0000 mL | Freq: Once | INTRAVENOUS | Status: AC
  Administered 2016-10-09: 250 mL via INTRAVENOUS

## 2016-10-09 MED ORDER — ACETAMINOPHEN 325 MG PO TABS
650.0000 mg | ORAL_TABLET | Freq: Once | ORAL | Status: AC
  Administered 2016-10-09: 650 mg via ORAL

## 2016-10-09 MED ORDER — HEPARIN SOD (PORK) LOCK FLUSH 100 UNIT/ML IV SOLN
500.0000 [IU] | Freq: Every day | INTRAVENOUS | Status: AC | PRN
  Administered 2016-10-09: 500 [IU]
  Filled 2016-10-09: qty 5

## 2016-10-09 MED ORDER — KLOR-CON M20 20 MEQ PO TBCR: 40.0000 meq | EXTENDED_RELEASE_TABLET | Freq: Every day | ORAL | 0 refills | Status: DC

## 2016-10-09 MED ORDER — SODIUM CHLORIDE 0.9% FLUSH
10.0000 mL | INTRAVENOUS | Status: AC | PRN
  Administered 2016-10-09: 10 mL
  Filled 2016-10-09: qty 10

## 2016-10-09 MED ORDER — DIPHENHYDRAMINE HCL 25 MG PO CAPS
ORAL_CAPSULE | ORAL | Status: AC
  Filled 2016-10-09: qty 1

## 2016-10-09 MED ORDER — ACETAMINOPHEN 325 MG PO TABS
ORAL_TABLET | ORAL | Status: AC
  Filled 2016-10-09: qty 2

## 2016-10-09 NOTE — Telephone Encounter (Signed)
Unable to reach pt or husband .She needs platelets and blood today.

## 2016-10-09 NOTE — Patient Instructions (Signed)
Blood Transfusion , Adult A blood transfusion is a procedure in which you receive donated blood, including plasma, platelets, and red blood cells, through an IV tube. You may need a blood transfusion because of illness, surgery, or injury. The blood may come from a donor. You may also be able to donate blood for yourself (autologous blood donation) before a surgery if you know that you might require a blood transfusion. The blood given in a transfusion is made up of different types of cells. You may receive:  Red blood cells. These carry oxygen to the cells in the body.  White blood cells. These help you fight infections.  Platelets. These help your blood to clot.  Plasma. This is the liquid part of your blood and it helps with fluid imbalances. If you have hemophilia or another clotting disorder, you may also receive other types of blood products. Tell a health care provider about:  Any allergies you have.  All medicines you are taking, including vitamins, herbs, eye drops, creams, and over-the-counter medicines.  Any problems you or family members have had with anesthetic medicines.  Any blood disorders you have.  Any surgeries you have had.  Any medical conditions you have, including any recent fever or cold symptoms.  Whether you are pregnant or may be pregnant.  Any previous reactions you have had during a blood transfusion. What are the risks? Generally, this is a safe procedure. However, problems may occur, including:  Having an allergic reaction to something in the donated blood. Hives and itching may be symptoms of this type of reaction.  Fever. This may be a reaction to the white blood cells in the transfused blood. Nausea or chest pain may accompany a fever.  Iron overload. This can happen from having many transfusions.  Transfusion-related acute lung injury (TRALI). This is a rare reaction that causes lung damage. The cause is not known.TRALI can occur within hours  of a transfusion or several days later.  Sudden (acute) or delayed hemolytic reactions. This happens if your blood does not match the cells in your transfusion. Your body's defense system (immune system) may try to attack the new cells. This complication is rare. The symptoms include fever, chills, nausea, and low back pain or chest pain.  Infection or disease transmission. This is rare. What happens before the procedure?  You will have a blood test to determine your blood type. This is necessary to know what kind of blood your body will accept and to match it to the donor blood.  If you are going to have a planned surgery, you may be able to do an autologous blood donation. This may be done in case you need to have a transfusion.  If you have had an allergic reaction to a transfusion in the past, you may be given medicine to help prevent a reaction. This medicine may be given to you by mouth or through an IV tube.  You will have your temperature, blood pressure, and pulse monitored before the transfusion.  Follow instructions from your health care provider about eating and drinking restrictions.  Ask your health care provider about:  Changing or stopping your regular medicines. This is especially important if you are taking diabetes medicines or blood thinners.  Taking medicines such as aspirin and ibuprofen. These medicines can thin your blood. Do not take these medicines before your procedure if your health care provider instructs you not to. What happens during the procedure?  An IV tube will be   inserted into one of your veins.  The bag of donated blood will be attached to your IV tube. The blood will then enter through your vein.  Your temperature, blood pressure, and pulse will be monitored regularly during the transfusion. This monitoring is done to detect early signs of a transfusion reaction.  If you have any signs or symptoms of a reaction, your transfusion will be stopped and  you may be given medicine.  When the transfusion is complete, your IV tube will be removed.  Pressure may be applied to the IV site for a few minutes.  A bandage (dressing) will be applied. The procedure may vary among health care providers and hospitals. What happens after the procedure?  Your temperature, blood pressure, heart rate, breathing rate, and blood oxygen level will be monitored often.  Your blood may be tested to see how you are responding to the transfusion.  You may be warmed with fluids or blankets to maintain a normal body temperature. Summary  A blood transfusion is a procedure in which you receive donated blood, including plasma, platelets, and red blood cells, through an IV tube.  Your temperature, blood pressure, and pulse will be monitored before, during, and after the transfusion.  Your blood may be tested after the transfusion to see how your body has responded. This information is not intended to replace advice given to you by your health care provider. Make sure you discuss any questions you have with your health care provider. Document Released: 08/16/2000 Document Revised: 05/16/2016 Document Reviewed: 05/16/2016 Elsevier Interactive Patient Education  2017 [employer]  

## 2016-10-09 NOTE — Telephone Encounter (Signed)
Spoke to pt and instructed her to return to cancer center for platelets and blood transfusion. Bleeding precautions reviewed with pt. I contacted PCP ( pt was to see him today at 1 for refills ) and he will give her limited supply of refills.

## 2016-10-10 ENCOUNTER — Other Ambulatory Visit: Payer: Self-pay | Admitting: Medical Oncology

## 2016-10-10 ENCOUNTER — Ambulatory Visit (HOSPITAL_COMMUNITY): Admission: RE | Admit: 2016-10-10 | Payer: Medicare Other | Source: Ambulatory Visit

## 2016-10-10 ENCOUNTER — Ambulatory Visit: Payer: Medicare Other

## 2016-10-10 ENCOUNTER — Telehealth: Payer: Self-pay | Admitting: Nurse Practitioner

## 2016-10-10 ENCOUNTER — Encounter: Payer: Self-pay | Admitting: Nurse Practitioner

## 2016-10-10 ENCOUNTER — Ambulatory Visit (HOSPITAL_COMMUNITY)
Admission: RE | Admit: 2016-10-10 | Discharge: 2016-10-10 | Disposition: A | Discharge status: Home / Self Care | Payer: Medicare Other | Source: Ambulatory Visit | Attending: Internal Medicine | Admitting: Internal Medicine

## 2016-10-10 DIAGNOSIS — R609 Edema, unspecified: Secondary | ICD-10-CM | POA: Insufficient documentation

## 2016-10-10 DIAGNOSIS — D649 Anemia, unspecified: Secondary | ICD-10-CM | POA: Diagnosis not present

## 2016-10-10 DIAGNOSIS — E876 Hypokalemia: Secondary | ICD-10-CM

## 2016-10-10 LAB — PREPARE RBC (CROSSMATCH)

## 2016-10-10 LAB — PREPARE PLATELET PHERESIS: UNIT DIVISION: 0

## 2016-10-10 MED ORDER — ACETAMINOPHEN 325 MG PO TABS
650.0000 mg | ORAL_TABLET | Freq: Once | ORAL | Status: AC
  Administered 2016-10-10: 650 mg via ORAL
  Filled 2016-10-10: qty 2

## 2016-10-10 MED ORDER — HEPARIN SOD (PORK) LOCK FLUSH 100 UNIT/ML IV SOLN: 250.0000 [IU] | INTRAVENOUS | Status: DC | PRN

## 2016-10-10 MED ORDER — POTASSIUM CHLORIDE CRYS ER 20 MEQ PO TBCR: 20.0000 meq | EXTENDED_RELEASE_TABLET | Freq: Two times a day (BID) | ORAL | 0 refills | Status: DC

## 2016-10-10 MED ORDER — HEPARIN SOD (PORK) LOCK FLUSH 100 UNIT/ML IV SOLN
500.0000 [IU] | Freq: Every day | INTRAVENOUS | Status: AC | PRN
  Administered 2016-10-10: 500 [IU]
  Filled 2016-10-10: qty 5

## 2016-10-10 MED ORDER — DIPHENHYDRAMINE HCL 25 MG PO CAPS
25.0000 mg | ORAL_CAPSULE | Freq: Once | ORAL | Status: AC
  Administered 2016-10-10: 25 mg via ORAL
  Filled 2016-10-10: qty 1

## 2016-10-10 MED ORDER — SODIUM CHLORIDE 0.9 % IV SOLN
250.0000 mL | Freq: Once | INTRAVENOUS | Status: AC
  Administered 2016-10-10: 250 mL via INTRAVENOUS

## 2016-10-10 MED ORDER — SODIUM CHLORIDE 0.9% FLUSH
10.0000 mL | INTRAVENOUS | Status: AC | PRN
  Administered 2016-10-10: 10 mL

## 2016-10-10 NOTE — Discharge Instructions (Signed)
Blood Transfusion, Adult, Care After This sheet gives you information about how to care for yourself after your procedure. Your health care provider may also give you more specific instructions. If you have problems or questions, contact your health care provider. What can I expect after the procedure? After your procedure, it is common to have:  Bruising and soreness where the IV tube was inserted.  Headache. Follow these instructions at home:  Take over-the-counter and prescription medicines only as told by your health care provider.  Return to your normal activities as told by your health care provider.  Follow instructions from your health care provider about how to take care of your IV insertion site. Make sure you:  Wash your hands with soap and water before you change your bandage (dressing). If soap and water are not available, use hand sanitizer.  Change your dressing as told by your health care provider.  Check your IV insertion site every day for signs of infection. Check for:  More redness, swelling, or pain.  More fluid or blood.  Warmth.  Pus or a bad smell. Contact a health care provider if:  You have more redness, swelling, or pain around the IV insertion site.  You have more fluid or blood coming from the IV insertion site.  Your IV insertion site feels warm to the touch.  You have pus or a bad smell coming from the IV insertion site.  Your urine turns pink, red, or brown.  You feel weak after doing your normal activities. Get help right away if:  You have signs of a serious allergic or immune system reaction, including:  Itchiness.  Hives.  Trouble breathing.  Anxiety.  Chest or lower back pain.  Fever, flushing, and chills.  Rapid pulse.  Rash.  Diarrhea.  Vomiting.  Dark urine.  Serious headache.  Dizziness.  Stiff neck.  Yellow coloration of the face or the white parts of the eyes (jaundice). This information is not  intended to replace advice given to you by your health care provider. Make sure you discuss any questions you have with your health care provider. Document Released: 09/09/2014 Document Revised: 04/17/2016 Document Reviewed: 03/04/2016 Elsevier Interactive Patient Education  2017 [employer]  

## 2016-10-10 NOTE — Progress Notes (Signed)
SYMPTOM MANAGEMENT CLINIC    Chief Complaint: Peripheral edema  HPI:  Debra Kidd 56 y.o. female diagnosed with lung cancer.  Patient is just completed [place] plan/etoposide and awaiting restaging scans.    No history exists.    Review of Systems  Cardiovascular: Positive for leg swelling.  All other systems reviewed and are negative.   Past Medical History:  Diagnosis Date  . 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 Blood type, Rh positive   . Cancer ([place])   . Chronic pain syndrome   . Depression 08/07/2016  . Encounter for antineoplastic chemotherapy 07/17/2016  . Hyperlipidemia   . Hypertension   . Hypertension 08/07/2016  . Osteoporosis     Past Surgical History:  Procedure Laterality Date  . ABDOMINAL HYSTERECTOMY  1981  . EYE SURGERY     left  . IR GENERIC HISTORICAL  06/03/2016   IR FLUORO GUIDE PORT INSERTION RIGHT 06/03/2016 [facility]  . IR GENERIC HISTORICAL  06/03/2016   IR US GUIDE VASC ACCESS RIGHT 06/03/2016 [facility]  . 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    is allergic to codeine; motrin [ibuprofen]; thiazide-type diuretics; and vicodin [hydrocodone-acetaminophen].  Allergies as of 10/09/2016      Reactions   Codeine Nausea And Vomiting   Motrin [ibuprofen]    Elevation of BP   [name]    intolerant   Vicodin [hydrocodone-acetaminophen] Nausea And Vomiting      Medication List       Accurate as of 10/09/16 11:59 PM. Always use your most recent med list.          calcium carbonate 500 MG chewable tablet Commonly known as:  TUMS - dosed in mg elemental calcium Chew 1 tablet by mouth once a week.   lidocaine-prilocaine  cream Commonly known as:  EMLA Apply 1 application topically as needed.   LORazepam 1 MG tablet Commonly known as:  ATIVAN Take 1 mg by mouth 2 (two) times daily.   losartan 50 MG tablet Commonly known as:  COZAAR Take 50 mg by mouth daily.   mirtazapine 30 MG tablet Commonly known as:  REMERON Take 1 tablet (30 mg total) by mouth at bedtime.   oxyCODONE-acetaminophen 5-325 MG tablet Commonly known as:  PERCOCET/ROXICET Take 0.5 tablets by mouth every 4 (four) hours as needed for severe pain.   potassium chloride SA 20 MEQ tablet Commonly known as:  K-DUR,KLOR-CON Take 2 tablets (40 mEq total) by mouth daily. For 7 days   KLOR-CON M20 20 MEQ tablet Generic drug:  potassium chloride SA Take 2 tablets (40 mEq total) by mouth daily.   prochlorperazine 10 MG tablet Commonly known as:  COMPAZINE Take 1 tablet (10 mg total) by mouth every 6 (six) hours as needed for nausea or vomiting.   traMADol 50 MG tablet Commonly known as:  ULTRAM Take by mouth every 6 (six) hours as needed.        PHYSICAL EXAMINATION  Oncology Vitals 10/10/2016 10/10/2016  Height - -  Weight - -  Weight (lbs) - -  BMI (kg/m2) - -  Temp 98.6 98.2  Pulse 83 84  Resp 20 20  SpO2 100 100  BSA (m2) - -  BP Readings from Last 2 Encounters:  10/10/16 127/68  10/09/16 128/76    Physical Exam  Constitutional: She is oriented to person, place, and time. Vital signs are normal. She appears unhealthy.  HENT:  Head: Normocephalic and atraumatic.  Eyes: Conjunctivae and EOM are normal. Pupils are equal, round, and reactive to light.  Neck: Normal range of motion.  Pulmonary/Chest: Effort normal. No respiratory distress.  Musculoskeletal: Normal range of motion. She exhibits edema. She exhibits no tenderness.  Trace edema to the left leg on exam.  Neurological: She is alert and oriented to person, place, and time.  Skin: Skin is warm and dry. There is pallor.  Psychiatric: Affect normal.  Nursing  note and vitals reviewed.   LABORATORY DATA:. Infusion on 10/09/2016  Component Date Value Ref Range Status  . Unit Number 10/09/2016 [id number]   Final  . Blood Component Type 10/09/2016 PLTP LI1 PAS   Final  . Unit division 10/09/2016 00   Final  . Status of Unit 10/09/2016 ISSUED,FINAL   Final  . Transfusion Status 10/09/2016 OK TO TRANSFUSE   Final  Hospital Outpatient Visit on 10/09/2016  Component Date Value Ref Range Status  . Order Confirmation 10/09/2016 ORDER PROCESSED BY BLOOD BANK   Final  . ISSUE DATE / TIME 10/09/2016 [id number]   Final  . Blood Product Unit Number 10/09/2016 [id number]   Final  . PRODUCT CODE 10/09/2016 [id number]   Final  . Unit Type and Rh 10/09/2016 0600   Final  . Blood Product Expiration Date 10/09/2016 [id number]   Final  . ISSUE DATE / TIME 10/09/2016 [id number]   Final  . Blood Product Unit Number 10/09/2016 [id number]   Final  . PRODUCT CODE 10/09/2016 [id number]   Final  . Unit Type and Rh 10/09/2016 0600   Final  . Blood Product Expiration Date 10/09/2016 [id number]   Final  Appointment on 10/09/2016  Component Date Value Ref Range Status  . WBC 10/09/2016 6.0  3.9 - 10.3 10e3/uL Final  . NEUT# 10/09/2016 2.9  1.5 - 6.5 10e3/uL Final  . HGB 10/09/2016 6.5* 11.6 - 15.9 g/dL Final  . HCT 10/09/2016 20.1* 34.8 - 46.6 % Final  . Platelets 10/09/2016 3* 145 - 400 10e3/uL Final  . MCV 10/09/2016 97.1  79.5 - 101.0 fL Final  . MCH 10/09/2016 31.4  25.1 - 34.0 pg Final  . MCHC 10/09/2016 32.3  31.5 - 36.0 g/dL Final  . RBC 10/09/2016 2.07* 3.70 - 5.45 10e6/uL Final  . RDW 10/09/2016 18.2* 11.2 - 14.5 % Final  . lymph# 10/09/2016 2.3  0.9 - 3.3 10e3/uL Final  . MONO# 10/09/2016 0.7  0.1 - 0.9 10e3/uL Final  . Eosinophils Absolute 10/09/2016 0.0  0.0 - 0.5 10e3/uL Final  . Basophils Absolute 10/09/2016 0.0  0.0 - 0.1 10e3/uL Final  . NEUT% 10/09/2016 48.7  38.4 - 76.8 % Final  . LYMPH% 10/09/2016 38.0  14.0 - 49.7 % Final  .  MONO% 10/09/2016 12.4  0.0 - 14.0 % Final  . EOS% 10/09/2016 0.7  0.0 - 7.0 % Final  . BASO% 10/09/2016 0.2  0.0 - 2.0 % Final  . nRBC 10/09/2016 0  0 - 0 % Final  . Sodium 10/09/2016 139  136 - 145 mEq/L Final  . Potassium 10/09/2016 2.6* 3.5 - 5.1 mEq/L Final  . Chloride 10/09/2016 101  98 - 109 mEq/L Final  . CO2 10/09/2016 23  22 - 29 mEq/L Final  . Glucose 10/09/2016 130  70 -  140 mg/dl Final  . BUN 10/09/2016 6.8* 7.0 - 26.0 mg/dL Final  . Creatinine 10/09/2016 0.8  0.6 - 1.1 mg/dL Final  . Total Bilirubin 10/09/2016 0.30  0.20 - 1.20 mg/dL Final  . Alkaline Phosphatase 10/09/2016 139  40 - 150 U/L Final  . AST 10/09/2016 12  5 - 34 U/L Final  . ALT 10/09/2016 23  0 - 55 U/L Final  . Total Protein 10/09/2016 6.6  6.4 - 8.3 g/dL Final  . Albumin 10/09/2016 3.5  3.5 - 5.0 g/dL Final  . Calcium 10/09/2016 8.8  8.4 - 10.4 mg/dL Final  . Anion Gap 10/09/2016 14* 3 - 11 mEq/L Final  . EGFR 10/09/2016 84* >90 ml/min/1.73 m2 Final  . Technologist Review 10/09/2016 occassional meta noted   Final    RADIOGRAPHIC STUDIES: No results found.  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  She is scheduled for labs and a restaging CT on 10/16/2016.  She is scheduled for labs and a follow-up visit on 10/23/2016.  Peripheral edema Patient has noted some trace edema to her left leg only with some mild discomfort behind her left knee for the past 24 hours.  On exam.  There is minimal/trace edema to the left leg.  There is no calf tenderness on exam.  Will order a Doppler ultrasound of the left lower extremity for tomorrow, Thursday, 10/10/2016 at 3 PM.  Patient was advised to go directly to the emergency department overnight with any worsening symptoms whatsoever.  Antineoplastic chemotherapy induced anemia Patient was significantly anemic.  Most recent hemoglobin down to 6.5.  Patient also was  noted to have a platelet count of 3.  Patient received 1 unit of blood and 1 unit of platelets today; and has plans to return to the sickle cell clinic tomorrow for additional blood transfusion per Dr. Julien Nordmann.   Patient stated understanding of all instructions; and was in agreement with this plan of care. The patient knows to call the clinic with any problems, questions or concerns.   Total time spent with patient was 15 minutes;  with greater than 75 percent of that time spent in face to face counseling regarding patient's symptoms,  and coordination of care and follow up.  Disclaimer:This dictation was prepared with Dragon/digital dictation along with [employer]. Any transcriptional errors that result from this process are unintentional.  Drue Second, NP 10/10/2016

## 2016-10-10 NOTE — Assessment & Plan Note (Signed)
Patient has noted some trace edema to her left leg only with some mild discomfort behind her left knee for the past 24 hours.  On exam.  There is minimal/trace edema to the left leg.  There is no calf tenderness on exam.  Will order a Doppler ultrasound of the left lower extremity for tomorrow, Thursday, 10/10/2016 at 3 PM.  Patient was advised to go directly to the emergency department overnight with any worsening symptoms whatsoever.

## 2016-10-10 NOTE — Assessment & Plan Note (Signed)
Patient was significantly anemic.  Most recent hemoglobin down to 6.5.  Patient also was noted to have a platelet count of 3.  Patient received 1 unit of blood and 1 unit of platelets today; and has plans to return to the sickle cell clinic tomorrow for additional blood transfusion per Dr. Julien Nordmann.

## 2016-10-10 NOTE — Telephone Encounter (Signed)
This provider received a phone call from Doppler ultrasound department stating that patient refused to go to the admitting department of the radiology department to register prior to her Doppler ultrasound of her left leg today.  Patient left the hospital instead.  This provider then called the patient.  Patient stated that she did not want to be admitted to the hospital just to have her ultrasound.  I clarified to the patient that she was not to be admitted to the hospital-but was just registering prior to undergoing a Doppler ultrasound of her left leg to rule out DVT.  Patient stated that  Her leg would be fine"; and she was going home.  Advised patient to call/return if she has any new worries whatsoever.  We'll notify Dr. Julien Nordmann of today's events.

## 2016-10-10 NOTE — Progress Notes (Addendum)
Procedure: Transfusion of 1 unit PRBCs  Ordering provider: Dr. [name]  Diagnosis: Symptomatic anemia (D64.9)  Pt arrived to receive 1 unit PRBCs; consent obtained; port accessed; transfusion completed with no complications noted; pt alert, oriented and ambulatory; accompanied by family

## 2016-10-10 NOTE — Assessment & Plan Note (Signed)
Patient received her final cycle 6 cycle of carboplatin/etoposide on 09/25/2016.  She is scheduled for labs and a restaging CT on 10/16/2016.  She is scheduled for labs and a follow-up visit on 10/23/2016.

## 2016-10-11 ENCOUNTER — Ambulatory Visit: Payer: Medicare Other

## 2016-10-11 LAB — TYPE AND SCREEN
Blood Product Expiration Date: 201803132359
Blood Product Expiration Date: 201803132359
ISSUE DATE / TIME: 201802071327
ISSUE DATE / TIME: 201802081012
UNIT TYPE AND RH: 600
Unit Type and Rh: 600

## 2016-10-16 ENCOUNTER — Encounter (HOSPITAL_COMMUNITY): Payer: Self-pay

## 2016-10-16 ENCOUNTER — Other Ambulatory Visit (HOSPITAL_BASED_OUTPATIENT_CLINIC_OR_DEPARTMENT_OTHER): Payer: Medicare Other

## 2016-10-16 ENCOUNTER — Ambulatory Visit (HOSPITAL_COMMUNITY)
Admission: RE | Admit: 2016-10-16 | Discharge: 2016-10-16 | Disposition: A | Discharge status: Home / Self Care | Payer: Medicare Other | Source: Ambulatory Visit | Attending: Nurse Practitioner | Admitting: Nurse Practitioner

## 2016-10-16 DIAGNOSIS — C3491 Malignant neoplasm of unspecified part of right bronchus or lung: Secondary | ICD-10-CM | POA: Diagnosis not present

## 2016-10-16 DIAGNOSIS — I2510 Atherosclerotic heart disease of native coronary artery without angina pectoris: Secondary | ICD-10-CM | POA: Diagnosis not present

## 2016-10-16 DIAGNOSIS — I700 Atherosclerosis of aorta: Secondary | ICD-10-CM | POA: Diagnosis not present

## 2016-10-16 DIAGNOSIS — R591 Generalized enlarged lymph nodes: Secondary | ICD-10-CM | POA: Diagnosis not present

## 2016-10-16 DIAGNOSIS — C3411 Malignant neoplasm of upper lobe, right bronchus or lung: Secondary | ICD-10-CM

## 2016-10-16 LAB — CBC WITH DIFFERENTIAL/PLATELET
BASO%: 0.2 % (ref 0.0–2.0)
BASOS ABS: 0 10*3/uL (ref 0.0–0.1)
EOS%: 0.2 % (ref 0.0–7.0)
Eosinophils Absolute: 0 10*3/uL (ref 0.0–0.5)
HEMATOCRIT: 31.5 % — AB (ref 34.8–46.6)
HGB: 10.4 g/dL — ABNORMAL LOW (ref 11.6–15.9)
LYMPH%: 36.4 % (ref 14.0–49.7)
MCH: 29.5 pg (ref 25.1–34.0)
MCHC: 33 g/dL (ref 31.5–36.0)
MCV: 89.5 fL (ref 79.5–101.0)
MONO#: 0.5 10*3/uL (ref 0.1–0.9)
MONO%: 10.4 % (ref 0.0–14.0)
NEUT#: 2.6 10*3/uL (ref 1.5–6.5)
NEUT%: 52.8 % (ref 38.4–76.8)
Platelets: 43 10*3/uL — ABNORMAL LOW (ref 145–400)
RBC: 3.52 10*6/uL — ABNORMAL LOW (ref 3.70–5.45)
RDW: 17.7 % — ABNORMAL HIGH (ref 11.2–14.5)
WBC: 5 10*3/uL (ref 3.9–10.3)
lymph#: 1.8 10*3/uL (ref 0.9–3.3)

## 2016-10-16 LAB — COMPREHENSIVE METABOLIC PANEL
ALT: 16 U/L (ref 0–55)
AST: 15 U/L (ref 5–34)
Albumin: 3.6 g/dL (ref 3.5–5.0)
Alkaline Phosphatase: 146 U/L (ref 40–150)
Anion Gap: 11 mEq/L (ref 3–11)
BUN: 11.2 mg/dL (ref 7.0–26.0)
CALCIUM: 9.1 mg/dL (ref 8.4–10.4)
CHLORIDE: 104 meq/L (ref 98–109)
CO2: 26 mEq/L (ref 22–29)
Creatinine: 0.8 mg/dL (ref 0.6–1.1)
EGFR: 77 mL/min/{1.73_m2} — ABNORMAL LOW (ref >90)
Glucose: 99 mg/dl (ref 70–140)
POTASSIUM: 4 meq/L (ref 3.5–5.1)
SODIUM: 141 meq/L (ref 136–145)
Total Bilirubin: 0.41 mg/dL (ref 0.20–1.20)
Total Protein: 7.3 g/dL (ref 6.4–8.3)

## 2016-10-16 MED ORDER — IOPAMIDOL (ISOVUE-300) INJECTION 61%
INTRAVENOUS | Status: AC
Start: 2016-10-16 — End: 2016-10-16
  Administered 2016-10-16: 100 mL
  Filled 2016-10-16: qty 100

## 2016-10-16 MED ORDER — SODIUM CHLORIDE 0.9 % IJ SOLN
INTRAMUSCULAR | Status: AC
  Filled 2016-10-16: qty 50

## 2016-10-23 ENCOUNTER — Ambulatory Visit (HOSPITAL_BASED_OUTPATIENT_CLINIC_OR_DEPARTMENT_OTHER): Payer: Medicare Other | Admitting: Internal Medicine

## 2016-10-23 ENCOUNTER — Other Ambulatory Visit (HOSPITAL_BASED_OUTPATIENT_CLINIC_OR_DEPARTMENT_OTHER): Payer: Medicare Other

## 2016-10-23 ENCOUNTER — Encounter: Payer: Self-pay | Admitting: Internal Medicine

## 2016-10-23 ENCOUNTER — Telehealth: Payer: Self-pay | Admitting: Internal Medicine

## 2016-10-23 ENCOUNTER — Encounter: Payer: Self-pay | Admitting: Radiation Oncology

## 2016-10-23 VITALS — BP 144/95 | HR 103 | Temp 98.3°F | Resp 18 | Ht 63.0 in | Wt 133.0 lb

## 2016-10-23 DIAGNOSIS — I10 Essential (primary) hypertension: Secondary | ICD-10-CM | POA: Diagnosis not present

## 2016-10-23 DIAGNOSIS — F329 Major depressive disorder, single episode, unspecified: Secondary | ICD-10-CM

## 2016-10-23 DIAGNOSIS — T451X5A Adverse effect of antineoplastic and immunosuppressive drugs, initial encounter: Secondary | ICD-10-CM

## 2016-10-23 DIAGNOSIS — C3411 Malignant neoplasm of upper lobe, right bronchus or lung: Secondary | ICD-10-CM | POA: Diagnosis present

## 2016-10-23 DIAGNOSIS — D6481 Anemia due to antineoplastic chemotherapy: Secondary | ICD-10-CM

## 2016-10-23 DIAGNOSIS — Z5111 Encounter for antineoplastic chemotherapy: Secondary | ICD-10-CM

## 2016-10-23 DIAGNOSIS — C3491 Malignant neoplasm of unspecified part of right bronchus or lung: Secondary | ICD-10-CM

## 2016-10-23 DIAGNOSIS — D61810 Antineoplastic chemotherapy induced pancytopenia: Secondary | ICD-10-CM | POA: Diagnosis not present

## 2016-10-23 LAB — CBC WITH DIFFERENTIAL/PLATELET
BASO%: 0.4 % (ref 0.0–2.0)
Basophils Absolute: 0 10*3/uL (ref 0.0–0.1)
EOS ABS: 0 10*3/uL (ref 0.0–0.5)
EOS%: 0.1 % (ref 0.0–7.0)
HEMATOCRIT: 28.8 % — AB (ref 34.8–46.6)
HGB: 9.8 g/dL — ABNORMAL LOW (ref 11.6–15.9)
LYMPH#: 2 10*3/uL (ref 0.9–3.3)
LYMPH%: 36.8 % (ref 14.0–49.7)
MCH: 30.8 pg (ref 25.1–34.0)
MCHC: 34.1 g/dL (ref 31.5–36.0)
MCV: 90.4 fL (ref 79.5–101.0)
MONO#: 0.7 10*3/uL (ref 0.1–0.9)
MONO%: 12.2 % (ref 0.0–14.0)
NEUT%: 50.5 % (ref 38.4–76.8)
NEUTROS ABS: 2.7 10*3/uL (ref 1.5–6.5)
PLATELETS: 151 10*3/uL (ref 145–400)
RBC: 3.19 10*6/uL — ABNORMAL LOW (ref 3.70–5.45)
RDW: 19.6 % — ABNORMAL HIGH (ref 11.2–14.5)
WBC: 5.4 10*3/uL (ref 3.9–10.3)

## 2016-10-23 LAB — COMPREHENSIVE METABOLIC PANEL
ALT: 25 U/L (ref 0–55)
ANION GAP: 9 meq/L (ref 3–11)
AST: 22 U/L (ref 5–34)
Albumin: 3.6 g/dL (ref 3.5–5.0)
Alkaline Phosphatase: 129 U/L (ref 40–150)
BILIRUBIN TOTAL: 0.41 mg/dL (ref 0.20–1.20)
BUN: 9.1 mg/dL (ref 7.0–26.0)
CALCIUM: 9.4 mg/dL (ref 8.4–10.4)
CO2: 28 mEq/L (ref 22–29)
CREATININE: 0.8 mg/dL (ref 0.6–1.1)
Chloride: 103 mEq/L (ref 98–109)
EGFR: 79 mL/min/{1.73_m2} — AB (ref >90)
Glucose: 96 mg/dl (ref 70–140)
Potassium: 3.8 mEq/L (ref 3.5–5.1)
Sodium: 140 mEq/L (ref 136–145)
TOTAL PROTEIN: 6.9 g/dL (ref 6.4–8.3)

## 2016-10-23 NOTE — Telephone Encounter (Signed)
Gave patient avs report and appointments for May and 3/5 with Dr. Sondra Come. Central radiology will call re scan. Confirmed with desk nurse weekly labs no longer needed and lab appointments for 2/28 thru end of March have been cxd - patient aware.

## 2016-10-23 NOTE — Progress Notes (Signed)
[place] [phone]   Fax:([phone]) [place], MD [address] [place] [address]  DIAGNOSIS: Extensive stage (T2a, N3, M1 B) small cell lung cancer presented with right upper lobe lung mass, large right anterior mediastinal and supraclavicular lymphadenopathy as well as pancreatic and splenic metastasis diagnosed in September 2017.  PRIOR THERAPY: Systemic chemotherapy was carboplatin for AUC of 5 on day 1 and etoposide 100 MG/M2 on days 1, 2 and 3 with Neulasta support. Status post 6 cycles with significant response of her disease.  CURRENT THERAPY: Observation.  INTERVAL HISTORY: Debra Kidd 56 y.o. female came to the clinic today for follow-up visit accompanied by her husband. The patient tolerated the last cycle of her treatment with carboplatin and etoposide fairly well except for significant pancytopenia. Her platelets count were down to 3000 and hemoglobin down to 6.5. She received PRBCs and platelet transfusion. She recovered well. She denied having any current chest pain, shortness of breath, cough or hemoptysis. She has no fever or chills. She denied having any nausea, vomiting, diarrhea or constipation. She denied having any significant weight loss or night sweats. She had repeat CT scan of the chest, abdomen and pelvis performed recently and she is here for evaluation and discussion of her scan results.  MEDICAL HISTORY: Past Medical History:  Diagnosis Date  . 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 Blood type, Rh positive   . Cancer ([place])   . Chronic pain syndrome   . Depression 08/07/2016  . Encounter for antineoplastic chemotherapy 07/17/2016  . Hyperlipidemia   . Hypertension   . Hypertension 08/07/2016  . Osteoporosis     ALLERGIES:  is allergic to codeine; motrin [ibuprofen]; thiazide-type diuretics; and vicodin [hydrocodone-acetaminophen].  MEDICATIONS:  Current Outpatient  Prescriptions  Medication Sig Dispense Refill  . calcium carbonate (TUMS - DOSED IN MG ELEMENTAL CALCIUM) 500 MG chewable tablet Chew 1 tablet by mouth once a week.    Marland Kitchen KLOR-CON M20 20 MEQ tablet Take 2 tablets (40 mEq total) by mouth daily. 20 tablet 0  . lidocaine-prilocaine (EMLA) cream Apply 1 application topically as needed. 30 g 0  . LORazepam (ATIVAN) 1 MG tablet Take 1 mg by mouth 2 (two) times daily.    Marland Kitchen losartan (COZAAR) 50 MG tablet Take 50 mg by mouth daily.    . mirtazapine (REMERON) 30 MG tablet Take 1 tablet (30 mg total) by mouth at bedtime. 30 tablet 2  . potassium chloride SA (K-DUR,KLOR-CON) 20 MEQ tablet Take 1 tablet (20 mEq total) by mouth 2 (two) times daily. 20 tablet 0  . prochlorperazine (COMPAZINE) 10 MG tablet Take 1 tablet (10 mg total) by mouth every 6 (six) hours as needed for nausea or vomiting. 30 tablet 0  . traMADol (ULTRAM) 50 MG tablet Take by mouth every 6 (six) hours as needed.    Marland Kitchen oxyCODONE-acetaminophen (PERCOCET/ROXICET) 5-325 MG tablet Take 0.5 tablets by mouth every 4 (four) hours as needed for severe pain.     No current facility-administered medications for this visit.     SURGICAL HISTORY:  Past Surgical History:  Procedure Laterality Date  . ABDOMINAL HYSTERECTOMY  1981  . EYE SURGERY     left  . IR GENERIC HISTORICAL  06/03/2016   IR FLUORO GUIDE PORT INSERTION RIGHT 06/03/2016 [facility]  . IR GENERIC HISTORICAL  06/03/2016   IR US GUIDE VASC ACCESS  RIGHT 06/03/2016 [facility]  . SKIN CANCER EXCISION     basal cell carcinoma L side of face    REVIEW OF SYSTEMS:  Constitutional: positive for fatigue Eyes: negative Ears, nose, mouth, throat, and face: negative Respiratory: negative Cardiovascular: negative Gastrointestinal: negative Genitourinary:negative Integument/breast: negative Hematologic/lymphatic: negative Musculoskeletal:negative Neurological: negative Behavioral/Psych: negative Endocrine:  negative Allergic/Immunologic: negative   PHYSICAL EXAMINATION: General appearance: alert, cooperative, fatigued and no distress Head: Normocephalic, without obvious abnormality, atraumatic Neck: no adenopathy, no JVD, supple, symmetrical, trachea midline and thyroid not enlarged, symmetric, no tenderness/mass/nodules Lymph nodes: Cervical, supraclavicular, and axillary nodes normal. Resp: clear to auscultation bilaterally Back: symmetric, no curvature. ROM normal. No CVA tenderness. Cardio: regular rate and rhythm, S1, S2 normal, no murmur, click, rub or gallop GI: soft, non-tender; bowel sounds normal; no masses,  no organomegaly Extremities: extremities normal, atraumatic, no cyanosis or edema Neurologic: Alert and oriented X 3, normal strength and tone. Normal symmetric reflexes. Normal coordination and gait  ECOG PERFORMANCE STATUS: 1 - Symptomatic but completely ambulatory  Blood pressure (!) 144/95, pulse (!) 103, temperature 98.3 F (36.8 C), temperature source Oral, resp. rate 18, height '5\' 3"'$  (1.6 m), weight 133 lb (60.3 kg), SpO2 100 %.  LABORATORY DATA: Lab Results  Component Value Date   WBC 5.4 10/23/2016   HGB 9.8 (L) 10/23/2016   HCT 28.8 (L) 10/23/2016   MCV 90.4 10/23/2016   PLT 151 10/23/2016      Chemistry      Component Value Date/Time   NA 141 10/16/2016 0949   K 4.0 10/16/2016 0949   CL 99 (L) 06/03/2016 1000   CO2 26 10/16/2016 0949   BUN 11.2 10/16/2016 0949   CREATININE 0.8 10/16/2016 0949      Component Value Date/Time   CALCIUM 9.1 10/16/2016 0949   ALKPHOS 146 10/16/2016 0949   AST 15 10/16/2016 0949   ALT 16 10/16/2016 0949   BILITOT 0.41 10/16/2016 0949       RADIOGRAPHIC STUDIES: Ct Chest W Contrast  Result Date: 10/16/2016 CLINICAL DATA:  56 year old female with history of small cell lung cancer. Restaging examination. Chemotherapy completed 2 weeks ago. Weakness since September 2017. Severe anemia. EXAM: CT CHEST, ABDOMEN, AND  PELVIS WITH CONTRAST TECHNIQUE: Multidetector CT imaging of the chest, abdomen and pelvis was performed following the standard protocol during bolus administration of intravenous contrast. CONTRAST:  100 mL of Isovue-300. COMPARISON:  CT of the chest, abdomen and pelvis 07/24/2016. FINDINGS: CT CHEST FINDINGS Cardiovascular: Heart size is normal. There is no significant pericardial fluid, thickening or pericardial calcification. There is aortic atherosclerosis, as well as atherosclerosis of the great vessels of the mediastinum and the coronary arteries, including calcified atherosclerotic plaque in the left anterior descending and right coronary arteries. Right internal jugular single-lumen porta cath with tip terminating in the right atrium. Mediastinum/Nodes: Right hilar lymph node measuring 1 cm in short axis. Some amorphous nodal tissue in the low right paratracheal nodal station measuring up to 1.2 cm in short axis, similar to the prior examination. No other new mediastinal or left hilar lymphadenopathy. Esophagus is unremarkable in appearance. No axillary lymphadenopathy. Lungs/Pleura: There continues to be some nodular areas of architectural distortion in the right upper lobe at the site of the previously treated right upper lobe mass, however, and these are smaller than the prior study, with the most notable nodular area currently measuring only 13 x 5 mm (previously up to 22 mm in length). The small cavitary areas also noted near  the apex of the right upper lobe, new compared to the prior study (image 27 of series 6). Remaining portions of the lungs otherwise are generally normal in appearance without any new suspicious appearing pulmonary nodules or masses. There is no acute consolidative airspace disease and no pleural effusions. Musculoskeletal: There are no aggressive appearing lytic or blastic lesions noted in the visualized portions of the skeleton. CT ABDOMEN PELVIS FINDINGS Hepatobiliary: 5 mm  hypervascular area in segment 4B of the liver (image 66 of series 2) is similar in retrospect compared to prior examinations, likely a small flash fill cavernous hemangioma or perfusion anomaly. No other larger more suspicious appearing hepatic lesions are noted. No intra or extrahepatic biliary ductal dilatation. Gallbladder is normal in appearance. Pancreas: No pancreatic mass. No pancreatic ductal dilatation. No pancreatic or peripancreatic fluid or inflammatory changes. Spleen: Unremarkable. Adrenals/Urinary Tract: Bilateral kidneys and bilateral adrenal glands are normal in appearance. No hydroureteronephrosis. Urinary bladder is normal in appearance. Stomach/Bowel: The appearance of the stomach is normal. There is no pathologic dilatation of small bowel or colon. The appendix is not confidently identified and may be surgically absent. Regardless, there are no inflammatory changes noted adjacent to the cecum to suggest the presence of an acute appendicitis at this time. Vascular/Lymphatic: Aortic atherosclerosis, without evidence of aneurysm or dissection in the abdominal or pelvic vasculature. No lymphadenopathy noted in the abdomen or pelvis. Reproductive: Status posthysterectomy. Ovaries are not confidently identified may be surgically absent or atrophic. Other: No significant volume of ascites.  No pneumoperitoneum. Musculoskeletal: Chronic compression fracture of L1 with approximately 25% loss with anterior vertebral body height is unchanged. There are no aggressive appearing lytic or blastic lesions noted in the visualized portions of the skeleton. IMPRESSION: 1. Today's study again demonstrates some areas of scarring in the right upper lobe at the site of the treated neoplasm, including one very small cavitary area which is new compared to the prior study. At this time, there is no convincing evidence to suggest local recurrence of disease. There continues to be mildly enlarged right hilar and low right  paratracheal lymph nodes which are stable compared to the most recent prior examination, and significantly improved compared to the August 2017 study. Continued attention to these regions on follow-up imaging is recommended. 2. No evidence of extra thoracic metastatic disease on today's examination. Specifically, previously noted splenic and pancreatic lesions are not identified on today's study. 3. Aortic atherosclerosis, in addition to 2 vessel coronary artery disease. Please note that although the presence of coronary artery calcium documents the presence of coronary artery disease, the severity of this disease and any potential stenosis cannot be assessed on this non-gated CT examination. Assessment for potential risk factor modification, dietary therapy or pharmacologic therapy may be warranted, if clinically indicated. 4. Additional incidental findings, as above. Electronically Signed   By: Vinnie Langton M.D.   On: 10/16/2016 14:24   Ct Abdomen Pelvis W Contrast  Result Date: 10/16/2016 CLINICAL DATA:  56 year old female with history of small cell lung cancer. Restaging examination. Chemotherapy completed 2 weeks ago. Weakness since September 2017. Severe anemia. EXAM: CT CHEST, ABDOMEN, AND PELVIS WITH CONTRAST TECHNIQUE: Multidetector CT imaging of the chest, abdomen and pelvis was performed following the standard protocol during bolus administration of intravenous contrast. CONTRAST:  100 mL of Isovue-300. COMPARISON:  CT of the chest, abdomen and pelvis 07/24/2016. FINDINGS: CT CHEST FINDINGS Cardiovascular: Heart size is normal. There is no significant pericardial fluid, thickening or pericardial calcification. There is  aortic atherosclerosis, as well as atherosclerosis of the great vessels of the mediastinum and the coronary arteries, including calcified atherosclerotic plaque in the left anterior descending and right coronary arteries. Right internal jugular single-lumen porta cath with tip  terminating in the right atrium. Mediastinum/Nodes: Right hilar lymph node measuring 1 cm in short axis. Some amorphous nodal tissue in the low right paratracheal nodal station measuring up to 1.2 cm in short axis, similar to the prior examination. No other new mediastinal or left hilar lymphadenopathy. Esophagus is unremarkable in appearance. No axillary lymphadenopathy. Lungs/Pleura: There continues to be some nodular areas of architectural distortion in the right upper lobe at the site of the previously treated right upper lobe mass, however, and these are smaller than the prior study, with the most notable nodular area currently measuring only 13 x 5 mm (previously up to 22 mm in length). The small cavitary areas also noted near the apex of the right upper lobe, new compared to the prior study (image 27 of series 6). Remaining portions of the lungs otherwise are generally normal in appearance without any new suspicious appearing pulmonary nodules or masses. There is no acute consolidative airspace disease and no pleural effusions. Musculoskeletal: There are no aggressive appearing lytic or blastic lesions noted in the visualized portions of the skeleton. CT ABDOMEN PELVIS FINDINGS Hepatobiliary: 5 mm hypervascular area in segment 4B of the liver (image 66 of series 2) is similar in retrospect compared to prior examinations, likely a small flash fill cavernous hemangioma or perfusion anomaly. No other larger more suspicious appearing hepatic lesions are noted. No intra or extrahepatic biliary ductal dilatation. Gallbladder is normal in appearance. Pancreas: No pancreatic mass. No pancreatic ductal dilatation. No pancreatic or peripancreatic fluid or inflammatory changes. Spleen: Unremarkable. Adrenals/Urinary Tract: Bilateral kidneys and bilateral adrenal glands are normal in appearance. No hydroureteronephrosis. Urinary bladder is normal in appearance. Stomach/Bowel: The appearance of the stomach is normal.  There is no pathologic dilatation of small bowel or colon. The appendix is not confidently identified and may be surgically absent. Regardless, there are no inflammatory changes noted adjacent to the cecum to suggest the presence of an acute appendicitis at this time. Vascular/Lymphatic: Aortic atherosclerosis, without evidence of aneurysm or dissection in the abdominal or pelvic vasculature. No lymphadenopathy noted in the abdomen or pelvis. Reproductive: Status posthysterectomy. Ovaries are not confidently identified may be surgically absent or atrophic. Other: No significant volume of ascites.  No pneumoperitoneum. Musculoskeletal: Chronic compression fracture of L1 with approximately 25% loss with anterior vertebral body height is unchanged. There are no aggressive appearing lytic or blastic lesions noted in the visualized portions of the skeleton. IMPRESSION: 1. Today's study again demonstrates some areas of scarring in the right upper lobe at the site of the treated neoplasm, including one very small cavitary area which is new compared to the prior study. At this time, there is no convincing evidence to suggest local recurrence of disease. There continues to be mildly enlarged right hilar and low right paratracheal lymph nodes which are stable compared to the most recent prior examination, and significantly improved compared to the August 2017 study. Continued attention to these regions on follow-up imaging is recommended. 2. No evidence of extra thoracic metastatic disease on today's examination. Specifically, previously noted splenic and pancreatic lesions are not identified on today's study. 3. Aortic atherosclerosis, in addition to 2 vessel coronary artery disease. Please note that although the presence of coronary artery calcium documents the presence of coronary artery disease,  the severity of this disease and any potential stenosis cannot be assessed on this non-gated CT examination. Assessment for  potential risk factor modification, dietary therapy or pharmacologic therapy may be warranted, if clinically indicated. 4. Additional incidental findings, as above. Electronically Signed   By: Vinnie Langton M.D.   On: 10/16/2016 14:24    ASSESSMENT AND PLAN:  This is a very pleasant 56 years old white female with extensive stage small cell lung cancer. The patient underwent systemic chemotherapy with carboplatin and etoposide for 6 cycles. She tolerated her treatment well except for the chemotherapy-induced pancytopenia. She required PRBCs and platelet transfusion. She had repeat CT scan of the chest, abdomen and pelvis performed recently. I personally and independently reviewed the scan images and discussed the results with the patient and her husband. Her scan showed significant improvement in her disease. I recommended for the patient to continue on observation for now with repeat CT scan of the chest, abdomen and pelvis in 3 months. I will also refer the patient to radiation oncology for consideration of prophylactic cranial irradiation. For the chemotherapy-induced pancytopenia, her blood count has significantly improved. We will continue to monitor her hemoglobin and hematocrit closely. For hypertension, she will continue to monitor her blood pressure closely, and report to her primary care physician for adjustment of her medication. She is currently on Cozaar. For depression, the patient will continue on Remeron 30 mg by mouth daily at bedtime. She was advised to call immediately if she has any concerning symptoms in the interval. The patient voices understanding of current disease status and treatment options and is in agreement with the current care plan.  All questions were answered. The patient knows to call the clinic with any problems, questions or concerns. We can certainly see the patient much sooner if necessary.  Disclaimer: This note was dictated with voice recognition  software. Similar sounding words can inadvertently be transcribed and may not be corrected upon review.

## 2016-10-30 ENCOUNTER — Ambulatory Visit: Payer: Medicare Other | Admitting: Internal Medicine

## 2016-10-30 ENCOUNTER — Ambulatory Visit: Payer: Medicare Other

## 2016-10-30 ENCOUNTER — Other Ambulatory Visit: Payer: Medicare Other

## 2016-10-31 ENCOUNTER — Ambulatory Visit: Payer: Medicare Other

## 2016-11-01 ENCOUNTER — Ambulatory Visit: Payer: Medicare Other

## 2016-11-01 NOTE — Progress Notes (Signed)
Thoracic Location of Tumor / Histology: Extensive stage (T2a, N3, M1 B) small cell lung cancer presented with right upper lobe lung mass, large right anterior mediastinal and supraclavicular lymphadenopathy    Patient presented with symptoms of: In late July 2017 The patient has been complaining of discomfort on the right side of the chest as well as cough and questionable seasonal allergy.   Biopsies  revealed:   05/21/16 Diagnosis Lymph node, needle/core biopsy, right supraclavicular - POSITIVE FOR SMALL CELL CARCINOMA. - SEE COMMENT.  Tobacco/Marijuana/Snuff/ETOH use: current smoker, uses marijuana, quit drinking beer in June 2017.  Past/Anticipated interventions by cardiothoracic surgery, if any: no  Past/Anticipated interventions by medical oncology, if any: Systemic chemotherapy was carboplatin for AUC of 5 on day 1 and etoposide 100 MG/M2 on days 1, 2 and 3 with Neulasta support. Status post 6 cycles   Signs/Symptoms  Weight changes, if any: no  Respiratory complaints, if any: no  Hemoptysis, if any: no  Pain issues, if any:  Yes has compression fracture in L1 and takes tramadol.  SAFETY ISSUES:  Prior radiation? no  Pacemaker/ICD? no   Possible current pregnancy?no  Is the patient on methotrexate? no  Current Complaints / other details:  Dr. Julien Nordmann is referring patient for consideration of prophylactic cranial irradiation.  Patient is here with her husband.  BP 122/90 (BP Location: Left Arm, Patient Position: Sitting)   Pulse (!) 102   Temp 98.4 F (36.9 C) (Oral)   Ht '5\' 3"'$  (1.6 m)   Wt 137 lb (62.1 kg)   SpO2 100%   BMI 24.27 kg/m    Wt Readings from Last 3 Encounters:  11/04/16 137 lb (62.1 kg)  10/23/16 133 lb (60.3 kg)  09/25/16 128 lb 3.2 oz (58.2 kg)

## 2016-11-04 ENCOUNTER — Ambulatory Visit
Admission: RE | Admit: 2016-11-04 | Discharge: 2016-11-04 | Disposition: A | Discharge status: Home / Self Care | Payer: Medicare Other | Source: Ambulatory Visit | Attending: Radiation Oncology | Admitting: Radiation Oncology

## 2016-11-04 ENCOUNTER — Encounter: Payer: Self-pay | Admitting: Radiation Oncology

## 2016-11-04 DIAGNOSIS — C3491 Malignant neoplasm of unspecified part of right bronchus or lung: Secondary | ICD-10-CM | POA: Diagnosis not present

## 2016-11-04 DIAGNOSIS — Z885 Allergy status to narcotic agent status: Secondary | ICD-10-CM | POA: Insufficient documentation

## 2016-11-04 DIAGNOSIS — H9192 Unspecified hearing loss, left ear: Secondary | ICD-10-CM | POA: Diagnosis not present

## 2016-11-04 DIAGNOSIS — Z8249 Family history of ischemic heart disease and other diseases of the circulatory system: Secondary | ICD-10-CM | POA: Diagnosis not present

## 2016-11-04 DIAGNOSIS — F17210 Nicotine dependence, cigarettes, uncomplicated: Secondary | ICD-10-CM | POA: Diagnosis not present

## 2016-11-04 DIAGNOSIS — Z888 Allergy status to other drugs, medicaments and biological substances status: Secondary | ICD-10-CM | POA: Insufficient documentation

## 2016-11-04 DIAGNOSIS — Z825 Family history of asthma and other chronic lower respiratory diseases: Secondary | ICD-10-CM | POA: Diagnosis not present

## 2016-11-04 DIAGNOSIS — Z85828 Personal history of other malignant neoplasm of skin: Secondary | ICD-10-CM | POA: Diagnosis not present

## 2016-11-04 DIAGNOSIS — Z9221 Personal history of antineoplastic chemotherapy: Secondary | ICD-10-CM | POA: Diagnosis not present

## 2016-11-04 DIAGNOSIS — I10 Essential (primary) hypertension: Secondary | ICD-10-CM | POA: Diagnosis not present

## 2016-11-04 DIAGNOSIS — F329 Major depressive disorder, single episode, unspecified: Secondary | ICD-10-CM | POA: Insufficient documentation

## 2016-11-04 DIAGNOSIS — E785 Hyperlipidemia, unspecified: Secondary | ICD-10-CM | POA: Diagnosis not present

## 2016-11-04 DIAGNOSIS — Z803 Family history of malignant neoplasm of breast: Secondary | ICD-10-CM | POA: Insufficient documentation

## 2016-11-04 DIAGNOSIS — G894 Chronic pain syndrome: Secondary | ICD-10-CM | POA: Insufficient documentation

## 2016-11-04 DIAGNOSIS — M810 Age-related osteoporosis without current pathological fracture: Secondary | ICD-10-CM | POA: Insufficient documentation

## 2016-11-04 DIAGNOSIS — Z510 Encounter for antineoplastic radiation therapy: Secondary | ICD-10-CM | POA: Insufficient documentation

## 2016-11-04 NOTE — Progress Notes (Signed)
Please see the Nurse Progress Note in the MD Initial Consult Encounter for this patient. 

## 2016-11-04 NOTE — Progress Notes (Signed)
Radiation Oncology         [phone] ________________________________  Initial outpatient Consultation  Name: Debra Kidd MRN: [id number]  Date: 11/04/2016  DOB: 11-15-1960  KX:FGHWEX,HBZJI, MD  [name], MD   REFERRING PHYSICIAN: [name], MD  DIAGNOSIS: Extensive stage small cell lung cancer (T2a, N3, M1 B)  HISTORY OF PRESENT ILLNESS::Debra Kidd is a 56 y.o. female who is seen out courtesy of Dr. Julien Nordmann for consideration for radiation therapy as part of management of the patient's advanced small cell lung cancer. She presented in September of last year and was found to have extensive stage disease with pancreatic and splenic metastasis. The patient has recently completed 6 cycles of carboplatinum and etoposide. Most recent scan show excellent response to therapy with no active disease. In light of the patient's excellent response to therapy she is now referred to radiation oncology for consideration for prophylactic cranial irradiation.  PREVIOUS RADIATION THERAPY: No  PAST MEDICAL HISTORY:  has a past medical history of Basal cell carcinoma of cheek; Blood type, Rh positive; Cancer ([place]); Chronic pain syndrome; Depression (08/07/2016); Encounter for antineoplastic chemotherapy (07/17/2016); Hyperlipidemia; Hypertension; Hypertension (08/07/2016); and Osteoporosis.    PAST SURGICAL HISTORY: Past Surgical History:  Procedure Laterality Date  . ABDOMINAL HYSTERECTOMY  1981  . APPENDECTOMY     at age 58  . EYE SURGERY     left  . IR GENERIC HISTORICAL  06/03/2016   IR FLUORO GUIDE PORT INSERTION RIGHT 06/03/2016 [facility]  . IR GENERIC HISTORICAL  06/03/2016   IR US GUIDE VASC ACCESS RIGHT 06/03/2016 [facility]  . SKIN CANCER EXCISION     basal cell carcinoma L side of face    FAMILY HISTORY: family history includes Breast cancer in her maternal grandmother; Emphysema in her brother; Heart disease in her brother, father, and mother.  SOCIAL HISTORY:   reports that she has been smoking.  She has a 43.00 pack-year smoking history. She has never used smokeless tobacco. She reports that she uses drugs, including Marijuana, about 7 times per week. She reports that she does not drink alcohol.  ALLERGIES: Codeine; Motrin [ibuprofen]; [name]; and Vicodin [hydrocodone-acetaminophen]  MEDICATIONS:  Current Outpatient Prescriptions  Medication Sig Dispense Refill  . lidocaine-prilocaine (EMLA) cream Apply 1 application topically as needed. 30 g 0  . LORazepam (ATIVAN) 1 MG tablet Take 1 mg by mouth 2 (two) times daily.    . prochlorperazine (COMPAZINE) 10 MG tablet Take 1 tablet (10 mg total) by mouth every 6 (six) hours as needed for nausea or vomiting. 30 tablet 0  . traMADol (ULTRAM) 50 MG tablet Take by mouth every 6 (six) hours as needed.    . calcium carbonate (TUMS - DOSED IN MG ELEMENTAL CALCIUM) 500 MG chewable tablet Chew 1 tablet by mouth once a week.    Marland Kitchen KLOR-CON M20 20 MEQ tablet Take 2 tablets (40 mEq total) by mouth daily. (Patient not taking: Reported on 11/04/2016) 20 tablet 0  . losartan (COZAAR) 50 MG tablet Take 50 mg by mouth daily.    . mirtazapine (REMERON) 30 MG tablet Take 1 tablet (30 mg total) by mouth at bedtime. (Patient not taking: Reported on 11/04/2016) 30 tablet 2  . oxyCODONE-acetaminophen (PERCOCET/ROXICET) 5-325 MG tablet Take 0.5 tablets by mouth every 4 (four) hours as needed for severe pain.    . potassium chloride SA (K-DUR,KLOR-CON) 20 MEQ tablet Take 1 tablet (20 mEq total) by mouth 2 (two) times daily. (Patient not taking: Reported on  11/04/2016) 20 tablet 0   No current facility-administered medications for this encounter.     REVIEW OF SYSTEMS:  A 12 point review of systems is documented in the electronic medical record. This was obtained by the nursing staff. However, I reviewed this with the patient to discuss relevant findings and make appropriate changes.     PHYSICAL EXAM:  height is '5\' 3"'$   (1.6 m) and weight is 137 lb (62.1 kg). Her oral temperature is 98.4 F (36.9 C). Her blood pressure is 122/90 and her pulse is 102 (abnormal). Her oxygen saturation is 100%.   General: Alert and oriented, in no acute distress, she is sensitive to the fluorescent lights and wears sun glasses during the evaluation HEENT: Head is normocephalic. Extraocular movements are intact. Oropharynx is clear. Patient is edentulous Neck: Neck is supple, no palpable cervical or supraclavicular lymphadenopathy. Heart: Regular in rate and rhythm with no murmurs, rubs, or gallops. Chest: Clear to auscultation bilaterally, with no rhonchi, wheezes, or rales. Abdomen: Soft, nontender, nondistended, with no rigidity or guarding. Extremities: No cyanosis or edema. Lymphatics: see Neck Exam Skin: No concerning lesions. Musculoskeletal: symmetric strength and muscle tone throughout. Neurologic: Cranial nerves II through XII are grossly intact. No obvious focalities. Speech is fluent. Coordination is intact. Psychiatric: Judgment and insight are intact. Affect is appropriate.    ECOG = 1  LABORATORY DATA:  Lab Results  Component Value Date   WBC 5.4 10/23/2016   HGB 9.8 (L) 10/23/2016   HCT 28.8 (L) 10/23/2016   MCV 90.4 10/23/2016   PLT 151 10/23/2016   NEUTROABS 2.7 10/23/2016   Lab Results  Component Value Date   NA 140 10/23/2016   K 3.8 10/23/2016   CL 99 (L) 06/03/2016   CO2 28 10/23/2016   GLUCOSE 96 10/23/2016   CREATININE 0.8 10/23/2016   CALCIUM 9.4 10/23/2016      RADIOGRAPHY: Ct Chest W Contrast  Result Date: 10/16/2016 CLINICAL DATA:  56 year old female with history of small cell lung cancer. Restaging examination. Chemotherapy completed 2 weeks ago. Weakness since September 2017. Severe anemia. EXAM: CT CHEST, ABDOMEN, AND PELVIS WITH CONTRAST TECHNIQUE: Multidetector CT imaging of the chest, abdomen and pelvis was performed following the standard protocol during bolus administration  of intravenous contrast. CONTRAST:  100 mL of Isovue-300. COMPARISON:  CT of the chest, abdomen and pelvis 07/24/2016. FINDINGS: CT CHEST FINDINGS Cardiovascular: Heart size is normal. There is no significant pericardial fluid, thickening or pericardial calcification. There is aortic atherosclerosis, as well as atherosclerosis of the great vessels of the mediastinum and the coronary arteries, including calcified atherosclerotic plaque in the left anterior descending and right coronary arteries. Right internal jugular single-lumen porta cath with tip terminating in the right atrium. Mediastinum/Nodes: Right hilar lymph node measuring 1 cm in short axis. Some amorphous nodal tissue in the low right paratracheal nodal station measuring up to 1.2 cm in short axis, similar to the prior examination. No other new mediastinal or left hilar lymphadenopathy. Esophagus is unremarkable in appearance. No axillary lymphadenopathy. Lungs/Pleura: There continues to be some nodular areas of architectural distortion in the right upper lobe at the site of the previously treated right upper lobe mass, however, and these are smaller than the prior study, with the most notable nodular area currently measuring only 13 x 5 mm (previously up to 22 mm in length). The small cavitary areas also noted near the apex of the right upper lobe, new compared to the prior study (image 27 of  series 6). Remaining portions of the lungs otherwise are generally normal in appearance without any new suspicious appearing pulmonary nodules or masses. There is no acute consolidative airspace disease and no pleural effusions. Musculoskeletal: There are no aggressive appearing lytic or blastic lesions noted in the visualized portions of the skeleton. CT ABDOMEN PELVIS FINDINGS Hepatobiliary: 5 mm hypervascular area in segment 4B of the liver (image 66 of series 2) is similar in retrospect compared to prior examinations, likely a small flash fill cavernous  hemangioma or perfusion anomaly. No other larger more suspicious appearing hepatic lesions are noted. No intra or extrahepatic biliary ductal dilatation. Gallbladder is normal in appearance. Pancreas: No pancreatic mass. No pancreatic ductal dilatation. No pancreatic or peripancreatic fluid or inflammatory changes. Spleen: Unremarkable. Adrenals/Urinary Tract: Bilateral kidneys and bilateral adrenal glands are normal in appearance. No hydroureteronephrosis. Urinary bladder is normal in appearance. Stomach/Bowel: The appearance of the stomach is normal. There is no pathologic dilatation of small bowel or colon. The appendix is not confidently identified and may be surgically absent. Regardless, there are no inflammatory changes noted adjacent to the cecum to suggest the presence of an acute appendicitis at this time. Vascular/Lymphatic: Aortic atherosclerosis, without evidence of aneurysm or dissection in the abdominal or pelvic vasculature. No lymphadenopathy noted in the abdomen or pelvis. Reproductive: Status posthysterectomy. Ovaries are not confidently identified may be surgically absent or atrophic. Other: No significant volume of ascites.  No pneumoperitoneum. Musculoskeletal: Chronic compression fracture of L1 with approximately 25% loss with anterior vertebral body height is unchanged. There are no aggressive appearing lytic or blastic lesions noted in the visualized portions of the skeleton. IMPRESSION: 1. Today's study again demonstrates some areas of scarring in the right upper lobe at the site of the treated neoplasm, including one very small cavitary area which is new compared to the prior study. At this time, there is no convincing evidence to suggest local recurrence of disease. There continues to be mildly enlarged right hilar and low right paratracheal lymph nodes which are stable compared to the most recent prior examination, and significantly improved compared to the August 2017 study. Continued  attention to these regions on follow-up imaging is recommended. 2. No evidence of extra thoracic metastatic disease on today's examination. Specifically, previously noted splenic and pancreatic lesions are not identified on today's study. 3. Aortic atherosclerosis, in addition to 2 vessel coronary artery disease. Please note that although the presence of coronary artery calcium documents the presence of coronary artery disease, the severity of this disease and any potential stenosis cannot be assessed on this non-gated CT examination. Assessment for potential risk factor modification, dietary therapy or pharmacologic therapy may be warranted, if clinically indicated. 4. Additional incidental findings, as above. Electronically Signed   By: Vinnie Langton M.D.   On: 10/16/2016 14:24   Ct Abdomen Pelvis W Contrast  Result Date: 10/16/2016 CLINICAL DATA:  56 year old female with history of small cell lung cancer. Restaging examination. Chemotherapy completed 2 weeks ago. Weakness since September 2017. Severe anemia. EXAM: CT CHEST, ABDOMEN, AND PELVIS WITH CONTRAST TECHNIQUE: Multidetector CT imaging of the chest, abdomen and pelvis was performed following the standard protocol during bolus administration of intravenous contrast. CONTRAST:  100 mL of Isovue-300. COMPARISON:  CT of the chest, abdomen and pelvis 07/24/2016. FINDINGS: CT CHEST FINDINGS Cardiovascular: Heart size is normal. There is no significant pericardial fluid, thickening or pericardial calcification. There is aortic atherosclerosis, as well as atherosclerosis of the great vessels of the mediastinum and the coronary  arteries, including calcified atherosclerotic plaque in the left anterior descending and right coronary arteries. Right internal jugular single-lumen porta cath with tip terminating in the right atrium. Mediastinum/Nodes: Right hilar lymph node measuring 1 cm in short axis. Some amorphous nodal tissue in the low right paratracheal  nodal station measuring up to 1.2 cm in short axis, similar to the prior examination. No other new mediastinal or left hilar lymphadenopathy. Esophagus is unremarkable in appearance. No axillary lymphadenopathy. Lungs/Pleura: There continues to be some nodular areas of architectural distortion in the right upper lobe at the site of the previously treated right upper lobe mass, however, and these are smaller than the prior study, with the most notable nodular area currently measuring only 13 x 5 mm (previously up to 22 mm in length). The small cavitary areas also noted near the apex of the right upper lobe, new compared to the prior study (image 27 of series 6). Remaining portions of the lungs otherwise are generally normal in appearance without any new suspicious appearing pulmonary nodules or masses. There is no acute consolidative airspace disease and no pleural effusions. Musculoskeletal: There are no aggressive appearing lytic or blastic lesions noted in the visualized portions of the skeleton. CT ABDOMEN PELVIS FINDINGS Hepatobiliary: 5 mm hypervascular area in segment 4B of the liver (image 66 of series 2) is similar in retrospect compared to prior examinations, likely a small flash fill cavernous hemangioma or perfusion anomaly. No other larger more suspicious appearing hepatic lesions are noted. No intra or extrahepatic biliary ductal dilatation. Gallbladder is normal in appearance. Pancreas: No pancreatic mass. No pancreatic ductal dilatation. No pancreatic or peripancreatic fluid or inflammatory changes. Spleen: Unremarkable. Adrenals/Urinary Tract: Bilateral kidneys and bilateral adrenal glands are normal in appearance. No hydroureteronephrosis. Urinary bladder is normal in appearance. Stomach/Bowel: The appearance of the stomach is normal. There is no pathologic dilatation of small bowel or colon. The appendix is not confidently identified and may be surgically absent. Regardless, there are no  inflammatory changes noted adjacent to the cecum to suggest the presence of an acute appendicitis at this time. Vascular/Lymphatic: Aortic atherosclerosis, without evidence of aneurysm or dissection in the abdominal or pelvic vasculature. No lymphadenopathy noted in the abdomen or pelvis. Reproductive: Status posthysterectomy. Ovaries are not confidently identified may be surgically absent or atrophic. Other: No significant volume of ascites.  No pneumoperitoneum. Musculoskeletal: Chronic compression fracture of L1 with approximately 25% loss with anterior vertebral body height is unchanged. There are no aggressive appearing lytic or blastic lesions noted in the visualized portions of the skeleton. IMPRESSION: 1. Today's study again demonstrates some areas of scarring in the right upper lobe at the site of the treated neoplasm, including one very small cavitary area which is new compared to the prior study. At this time, there is no convincing evidence to suggest local recurrence of disease. There continues to be mildly enlarged right hilar and low right paratracheal lymph nodes which are stable compared to the most recent prior examination, and significantly improved compared to the August 2017 study. Continued attention to these regions on follow-up imaging is recommended. 2. No evidence of extra thoracic metastatic disease on today's examination. Specifically, previously noted splenic and pancreatic lesions are not identified on today's study. 3. Aortic atherosclerosis, in addition to 2 vessel coronary artery disease. Please note that although the presence of coronary artery calcium documents the presence of coronary artery disease, the severity of this disease and any potential stenosis cannot be assessed on this non-gated CT  examination. Assessment for potential risk factor modification, dietary therapy or pharmacologic therapy may be warranted, if clinically indicated. 4. Additional incidental findings, as  above. Electronically Signed   By: Vinnie Langton M.D.   On: 10/16/2016 14:24      IMPRESSION: Extensive stage small cell lung cancer. The patient has had an excellent response to her full dose chemotherapy, recent scan showing her to be in remission. In light of these findings and her young age I would recommend prophylactic cranial irradiation therapy as part of her overall management. I discussed course of treatment side effects and potential toxicities of this treatment with the patient and her husband. She appears to understand wishes to proceed with planned course of treatment. It has been approximately 6 months since her last brain MRI and I will order this prior to initiating prophylactic cranial radiation  PLAN: Patient will return for planning after completion of her brain MRI, pending results of the study she will receive either 10 treatments to the whole brain as prophylaxis for 14 treatments if she is found to have metastatic disease to this region.   I spent 45 minutes minutes face to face with the patient and more than 50% of that time was spent in counseling and/or coordination of care.   ------------------------------------------------  Blair Promise, PhD, MD

## 2016-11-05 ENCOUNTER — Telehealth: Payer: Self-pay | Admitting: *Deleted

## 2016-11-05 NOTE — Telephone Encounter (Signed)
CALLED PATIENT TO INFORM OF MRI FOR 11-07-16 - ARRIVAL TIME - 6:45 PM @ [facility], NO ANSWER WILL CALL LATER

## 2016-11-06 ENCOUNTER — Telehealth: Payer: Self-pay | Admitting: *Deleted

## 2016-11-06 ENCOUNTER — Ambulatory Visit: Payer: Medicare Other

## 2016-11-06 ENCOUNTER — Other Ambulatory Visit: Payer: Medicare Other

## 2016-11-06 ENCOUNTER — Ambulatory Visit: Payer: Medicare Other | Admitting: Internal Medicine

## 2016-11-06 NOTE — Telephone Encounter (Signed)
CALLED PATIENT TO INFORM OF MRI FOR 11-07-16 - ARRIVAL TIME - 6:45 PM @ [facility], NO RESTRICTIONS TO TEST, SPOKE WITH PATIENT AND SHE IS AWARE OF THIS TEST

## 2016-11-07 ENCOUNTER — Ambulatory Visit (HOSPITAL_COMMUNITY)
Admission: RE | Admit: 2016-11-07 | Discharge: 2016-11-07 | Disposition: A | Discharge status: Home / Self Care | Payer: Medicare Other | Source: Ambulatory Visit | Attending: Radiation Oncology | Admitting: Radiation Oncology

## 2016-11-07 ENCOUNTER — Ambulatory Visit: Payer: Medicare Other

## 2016-11-07 DIAGNOSIS — G319 Degenerative disease of nervous system, unspecified: Secondary | ICD-10-CM | POA: Insufficient documentation

## 2016-11-07 DIAGNOSIS — C3491 Malignant neoplasm of unspecified part of right bronchus or lung: Secondary | ICD-10-CM | POA: Diagnosis not present

## 2016-11-07 MED ORDER — GADOBENATE DIMEGLUMINE 529 MG/ML IV SOLN
15.0000 mL | Freq: Once | INTRAVENOUS | Status: AC | PRN
  Administered 2016-11-07: 12 mL via INTRAVENOUS

## 2016-11-08 ENCOUNTER — Ambulatory Visit: Payer: Medicare Other

## 2016-11-12 ENCOUNTER — Ambulatory Visit
Admission: RE | Admit: 2016-11-12 | Discharge: 2016-11-12 | Disposition: A | Discharge status: Home / Self Care | Payer: Medicare Other | Source: Ambulatory Visit | Attending: Radiation Oncology | Admitting: Radiation Oncology

## 2016-11-12 DIAGNOSIS — Z510 Encounter for antineoplastic radiation therapy: Secondary | ICD-10-CM | POA: Diagnosis not present

## 2016-11-12 DIAGNOSIS — C3491 Malignant neoplasm of unspecified part of right bronchus or lung: Secondary | ICD-10-CM

## 2016-11-13 ENCOUNTER — Other Ambulatory Visit: Payer: Medicare Other

## 2016-11-13 NOTE — Progress Notes (Signed)
  Radiation Oncology         [phone] ________________________________  Name: Lakeithia Rasor MRN: [id number]  Date: 11/12/2016  DOB: 07-23-1961  SIMULATION AND TREATMENT PLANNING NOTE    ICD-9-CM ICD-10-CM   1. 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  Identity was confirmed.  All relevant records and images related to the planned course of therapy were reviewed.  The patient freely provided informed written consent to proceed with treatment after reviewing the details related to the planned course of therapy. The consent form was witnessed and verified by the simulation staff.  Then, the patient was set-up in a stable reproducible  supine position for radiation therapy.  CT images were obtained.  Surface markings were placed.  The CT images were loaded into the planning software.  Then the target and avoidance structures were contoured.  Treatment planning then occurred.  The radiation prescription was entered and confirmed.  Then, I designed and supervised the construction of a total of 3 medically necessary complex treatment devices.  I have requested : Isodose Plan.  I have ordered:dose calc.  PLAN:  The patient will receive 25 Gy in 10 fractions.  -----------------------------------  Blair Promise, PhD, MD

## 2016-11-18 DIAGNOSIS — Z510 Encounter for antineoplastic radiation therapy: Secondary | ICD-10-CM | POA: Diagnosis not present

## 2016-11-19 ENCOUNTER — Ambulatory Visit: Admission: RE | Admit: 2016-11-19 | Payer: Medicare Other | Source: Ambulatory Visit | Admitting: Radiation Oncology

## 2016-11-19 ENCOUNTER — Ambulatory Visit
Admission: RE | Admit: 2016-11-19 | Discharge: 2016-11-19 | Disposition: A | Discharge status: Home / Self Care | Payer: Medicare Other | Source: Ambulatory Visit | Attending: Radiation Oncology | Admitting: Radiation Oncology

## 2016-11-19 DIAGNOSIS — Z510 Encounter for antineoplastic radiation therapy: Secondary | ICD-10-CM | POA: Diagnosis not present

## 2016-11-19 DIAGNOSIS — C3491 Malignant neoplasm of unspecified part of right bronchus or lung: Secondary | ICD-10-CM

## 2016-11-19 NOTE — Progress Notes (Signed)
  Radiation Oncology         [phone] ________________________________  Name: Debra Kidd MRN: [id number]  Date: 11/19/2016  DOB: 1960-10-05  Simulation Verification Note    ICD-9-CM ICD-10-CM   1. 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 The clinical setup was verified. Then port films were obtained and uploaded to the radiation oncology medical record software.  The treatment beams were carefully compared against the planned radiation fields. The position location and shape of the radiation fields was reviewed. They targeted volume of tissue appears to be appropriately covered by the radiation beams. Organs at risk appear to be excluded as planned.  Based on my personal review, I approved the simulation verification. The patient's treatment will proceed as planned.  -----------------------------------  Blair Promise, PhD, MD

## 2016-11-20 ENCOUNTER — Ambulatory Visit
Admission: RE | Admit: 2016-11-20 | Discharge: 2016-11-20 | Disposition: A | Discharge status: Home / Self Care | Payer: Medicare Other | Source: Ambulatory Visit | Attending: Radiation Oncology | Admitting: Radiation Oncology

## 2016-11-20 ENCOUNTER — Ambulatory Visit: Payer: Medicare Other

## 2016-11-20 ENCOUNTER — Ambulatory Visit: Payer: Medicare Other | Admitting: Internal Medicine

## 2016-11-20 ENCOUNTER — Other Ambulatory Visit: Payer: Medicare Other

## 2016-11-20 DIAGNOSIS — Z510 Encounter for antineoplastic radiation therapy: Secondary | ICD-10-CM | POA: Diagnosis not present

## 2016-11-21 ENCOUNTER — Ambulatory Visit: Payer: Medicare Other

## 2016-11-21 ENCOUNTER — Ambulatory Visit
Admission: RE | Admit: 2016-11-21 | Discharge: 2016-11-21 | Disposition: A | Discharge status: Home / Self Care | Payer: Medicare Other | Source: Ambulatory Visit | Attending: Radiation Oncology | Admitting: Radiation Oncology

## 2016-11-21 DIAGNOSIS — Z510 Encounter for antineoplastic radiation therapy: Secondary | ICD-10-CM | POA: Diagnosis not present

## 2016-11-22 ENCOUNTER — Ambulatory Visit: Payer: Medicare Other

## 2016-11-22 ENCOUNTER — Ambulatory Visit
Admission: RE | Admit: 2016-11-22 | Discharge: 2016-11-22 | Disposition: A | Discharge status: Home / Self Care | Payer: Medicare Other | Source: Ambulatory Visit | Attending: Radiation Oncology | Admitting: Radiation Oncology

## 2016-11-22 DIAGNOSIS — C3491 Malignant neoplasm of unspecified part of right bronchus or lung: Secondary | ICD-10-CM

## 2016-11-22 DIAGNOSIS — Z510 Encounter for antineoplastic radiation therapy: Secondary | ICD-10-CM | POA: Diagnosis not present

## 2016-11-22 MED ORDER — BIAFINE EX EMUL
Freq: Once | CUTANEOUS | Status: AC
  Administered 2016-11-22: 10:00:00 via TOPICAL

## 2016-11-22 NOTE — Progress Notes (Signed)
Pt here for patient teaching.  Pt given Radiation and You booklet and biafine.  Reviewed areas of pertinence such as fatigue, hair loss, nausea and vomiting, skin changes and headache . Pt able to give teach back of applying biafine BID to pat skin and use unscented/gentle soap,avoid applying anything to skin within 4 hours of treatment. Pt demonstrated understanding and verbalizes understanding of information given and will contact nursing with any questions or concerns.

## 2016-11-25 ENCOUNTER — Ambulatory Visit
Admission: RE | Admit: 2016-11-25 | Discharge: 2016-11-25 | Disposition: A | Discharge status: Home / Self Care | Payer: Medicare Other | Source: Ambulatory Visit | Attending: Radiation Oncology | Admitting: Radiation Oncology

## 2016-11-25 ENCOUNTER — Encounter: Payer: Self-pay | Admitting: Radiation Oncology

## 2016-11-25 VITALS — BP 144/94 | HR 94 | Temp 99.1°F | Ht 63.0 in | Wt 138.6 lb

## 2016-11-25 DIAGNOSIS — Z510 Encounter for antineoplastic radiation therapy: Secondary | ICD-10-CM | POA: Diagnosis not present

## 2016-11-25 DIAGNOSIS — C3491 Malignant neoplasm of unspecified part of right bronchus or lung: Secondary | ICD-10-CM

## 2016-11-25 NOTE — Progress Notes (Signed)
  Radiation Oncology         [phone] ________________________________  Name: Debra Kidd MRN: [id number]  Date: 11/25/2016  DOB: 29-Apr-1961  Weekly Radiation Therapy Management    ICD-9-CM ICD-10-CM   1. Small cell carcinoma of right lung (HCC) 162.9 C34.91    Prophylactic cranial irradiation  Current Dose: 12.5 Gy     Planned Dose:  25 Gy  Narrative . . . . . . . . The patient presents for routine under treatment assessment. She has completed 5 fractions to her brain. She denies pain, headaches, or nausea. She reports difficulty sleeping at night for the past week and associated fatigue. She is not taking Decadron at this time. She is using Biafine on the top of her ears. Per nursing, the skin on her ears and forehead are intact.                                   The patient is without complaint.                                 Set-up films were reviewed.                                 The chart was checked. Physical Findings. . .  height is '5\' 3"'$  (1.6 m) and weight is 138 lb 9.6 oz (62.9 kg). Her oral temperature is 99.1 F (37.3 C). Her blood pressure is 144/94 (abnormal) and her pulse is 94. Her oxygen saturation is 100%. . Weight essentially stable.  No significant changes. Scalp shows no significant erythema. Oropharynx is clear.  Impression . . . . . . . The patient is tolerating radiation Except for sleep issues at night. I recommend the patient limit her napping an afternoon to help with this problem Plan . . . . . . . . . . . . Continue treatment as planned.  ________________________________   Blair Promise, PhD, MD  This document serves as a record of services personally performed by Gery Pray, MD. It was created on his behalf by Bethann Humble, a trained medical scribe. The creation of this record is based on the scribe's personal observations and the provider's statements to them. This document has been checked and approved by the attending provider.

## 2016-11-25 NOTE — Progress Notes (Signed)
Debra Kidd has completed 5 fractions to her brain.  She denies having pain, headaches and nausea.  She reports she is tired but has trouble sleeping at night.  She is not taking decadron.  She used biafine over the weekend on the tops of her ears.  The skin on her ears and forehead are intact.  BP (!) 144/94 (BP Location: Left Arm, Patient Position: Sitting)   Pulse 94   Temp 99.1 F (37.3 C) (Oral)   Ht '5\' 3"'$  (1.6 m)   Wt 138 lb 9.6 oz (62.9 kg)   SpO2 100%   BMI 24.55 kg/m    Wt Readings from Last 3 Encounters:  11/25/16 138 lb 9.6 oz (62.9 kg)  11/04/16 137 lb (62.1 kg)  10/23/16 133 lb (60.3 kg)

## 2016-11-26 ENCOUNTER — Ambulatory Visit
Admission: RE | Admit: 2016-11-26 | Discharge: 2016-11-26 | Disposition: A | Discharge status: Home / Self Care | Payer: Medicare Other | Source: Ambulatory Visit | Attending: Radiation Oncology | Admitting: Radiation Oncology

## 2016-11-26 DIAGNOSIS — Z510 Encounter for antineoplastic radiation therapy: Secondary | ICD-10-CM | POA: Diagnosis not present

## 2016-11-27 ENCOUNTER — Other Ambulatory Visit: Payer: Medicare Other

## 2016-11-27 ENCOUNTER — Ambulatory Visit
Admission: RE | Admit: 2016-11-27 | Discharge: 2016-11-27 | Disposition: A | Discharge status: Home / Self Care | Payer: Medicare Other | Source: Ambulatory Visit | Attending: Radiation Oncology | Admitting: Radiation Oncology

## 2016-11-27 DIAGNOSIS — Z510 Encounter for antineoplastic radiation therapy: Secondary | ICD-10-CM | POA: Diagnosis not present

## 2016-11-28 ENCOUNTER — Ambulatory Visit
Admission: RE | Admit: 2016-11-28 | Discharge: 2016-11-28 | Disposition: A | Discharge status: Home / Self Care | Payer: Medicare Other | Source: Ambulatory Visit | Attending: Radiation Oncology | Admitting: Radiation Oncology

## 2016-11-28 DIAGNOSIS — Z510 Encounter for antineoplastic radiation therapy: Secondary | ICD-10-CM | POA: Diagnosis not present

## 2016-11-29 ENCOUNTER — Ambulatory Visit
Admission: RE | Admit: 2016-11-29 | Discharge: 2016-11-29 | Disposition: A | Discharge status: Home / Self Care | Payer: Medicare Other | Source: Ambulatory Visit | Attending: Radiation Oncology | Admitting: Radiation Oncology

## 2016-11-29 DIAGNOSIS — Z510 Encounter for antineoplastic radiation therapy: Secondary | ICD-10-CM | POA: Diagnosis not present

## 2016-12-02 ENCOUNTER — Encounter: Payer: Self-pay | Admitting: Radiation Oncology

## 2016-12-02 ENCOUNTER — Ambulatory Visit
Admission: RE | Admit: 2016-12-02 | Discharge: 2016-12-02 | Disposition: A | Discharge status: Home / Self Care | Payer: Medicare Other | Source: Ambulatory Visit | Attending: Radiation Oncology | Admitting: Radiation Oncology

## 2016-12-02 VITALS — BP 143/97 | HR 99 | Temp 98.9°F | Resp 16 | Wt 137.6 lb

## 2016-12-02 DIAGNOSIS — C3491 Malignant neoplasm of unspecified part of right bronchus or lung: Secondary | ICD-10-CM

## 2016-12-02 DIAGNOSIS — Z510 Encounter for antineoplastic radiation therapy: Secondary | ICD-10-CM | POA: Diagnosis not present

## 2016-12-02 MED ORDER — BIAFINE EX EMUL
Freq: Two times a day (BID) | CUTANEOUS | Status: DC
  Administered 2016-12-02: 15:00:00 via TOPICAL

## 2016-12-02 NOTE — Progress Notes (Signed)
  Radiation Oncology         (336) [phone] ________________________________  Name: Debra Kidd MRN: [id number]  Date: 12/02/2016  DOB: 1961-05-06  Weekly Radiation Therapy Management    ICD-9-CM ICD-10-CM   1. 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 . . . . . . . The patient presents for routine under treatment assessment. She completed her final fraction today. She reports soreness to the right side of her scalp and itching on her forehead. She reports 5/10 pain to this area and she is using Biafine cream. She denies headaches, memory loss, or issues with speech. She reports hearing loss to the left ear.                                 Set-up films were reviewed.                                 The chart was checked. Physical Findings. . .  weight is 137 lb 9.6 oz (62.4 kg). Her oral temperature is 98.9 F (37.2 C). Her blood pressure is 143/97 (abnormal) and her pulse is 99. Her respiration is 16 and oxygen saturation is 99%. . Weight essentially stable.  No significant changes. Scalp shows erythema particularly to the forehead, no skin breakdown. Oropharynx is clear.  Impression . . . . . . . The patient is tolerating radiation. Plan . . . . . . . . . . . . A one month follow up appointment was given to the patient. A refill was given on Biafine, advised the patient she could try Sarna for additional relief.  ________________________________    Blair Promise, PhD, MD  This document serves as a record of services personally performed by Gery Pray, MD. It was created on his behalf by Bethann Humble, a trained medical scribe. The creation of this record is based on the scribe's personal observations and the provider's statements to them. This document has been checked and approved by the attending provider.

## 2016-12-02 NOTE — Progress Notes (Signed)
Kjersti Dittmer completed 10th & final fraction of radiation to brain today.  Patient reports having a soreness to right side of head.  Patient reports that is rates a 5 out of 10 and she reports using Biafine cream.  Since today is her last treatment gave refill at patients request of Biafine.  Denies headache, memory loss, issues with speech.  Patient reports having hearing loss since last Thursday to left ear.  States the only sound she hears is a buzz.     Vitals:   12/02/16 1251  BP: (!) 143/97  Pulse: 99  Resp: 16  Temp: 98.9 F (37.2 C)  TempSrc: Oral  SpO2: 99%  Weight: 137 lb 9.6 oz (62.4 kg)    Wt Readings from Last 3 Encounters:  12/02/16 137 lb 9.6 oz (62.4 kg)  11/25/16 138 lb 9.6 oz (62.9 kg)  11/04/16 137 lb (62.1 kg)

## 2016-12-02 NOTE — Progress Notes (Signed)
  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 During treatment she complained of soreness to the right side of head and and some itching of the scalp region. Some possible hearing changes along the left side  Plan: The patient has completed radiation treatment. The patient will return to radiation oncology clinic for routine followup in one month. I advised them to call or return sooner if they have any questions or concerns related to their recovery or treatment.  -----------------------------------  Blair Promise, PhD, MD  This document serves as a record of services personally performed by Gery Pray, MD. It was created on his behalf by Bethann Humble, a trained medical scribe. The creation of this record is based on the scribe's personal observations and the provider's statements to them. This document has been checked and approved by the attending provider.

## 2016-12-04 ENCOUNTER — Other Ambulatory Visit: Payer: Medicare Other

## 2016-12-05 ENCOUNTER — Ambulatory Visit: Payer: Medicare Other | Admitting: Internal Medicine

## 2016-12-11 ENCOUNTER — Ambulatory Visit: Payer: Medicare Other | Admitting: Nurse Practitioner

## 2016-12-11 ENCOUNTER — Ambulatory Visit: Payer: Medicare Other

## 2016-12-11 ENCOUNTER — Ambulatory Visit: Payer: Medicare Other | Admitting: Internal Medicine

## 2016-12-11 ENCOUNTER — Other Ambulatory Visit: Payer: Medicare Other

## 2016-12-12 ENCOUNTER — Ambulatory Visit: Payer: Medicare Other

## 2016-12-13 ENCOUNTER — Ambulatory Visit: Payer: Medicare Other

## 2017-01-20 ENCOUNTER — Encounter: Payer: Self-pay | Admitting: Oncology

## 2017-01-22 ENCOUNTER — Ambulatory Visit (HOSPITAL_COMMUNITY)
Admission: RE | Admit: 2017-01-22 | Discharge: 2017-01-22 | Disposition: A | Discharge status: Home / Self Care | Payer: Medicare Other | Source: Ambulatory Visit | Attending: Internal Medicine | Admitting: Internal Medicine

## 2017-01-22 ENCOUNTER — Other Ambulatory Visit (HOSPITAL_BASED_OUTPATIENT_CLINIC_OR_DEPARTMENT_OTHER): Payer: Medicare Other

## 2017-01-22 DIAGNOSIS — I10 Essential (primary) hypertension: Secondary | ICD-10-CM | POA: Insufficient documentation

## 2017-01-22 DIAGNOSIS — T451X5A Adverse effect of antineoplastic and immunosuppressive drugs, initial encounter: Secondary | ICD-10-CM

## 2017-01-22 DIAGNOSIS — I2510 Atherosclerotic heart disease of native coronary artery without angina pectoris: Secondary | ICD-10-CM | POA: Insufficient documentation

## 2017-01-22 DIAGNOSIS — C3491 Malignant neoplasm of unspecified part of right bronchus or lung: Secondary | ICD-10-CM | POA: Diagnosis not present

## 2017-01-22 DIAGNOSIS — Z5111 Encounter for antineoplastic chemotherapy: Secondary | ICD-10-CM | POA: Diagnosis present

## 2017-01-22 DIAGNOSIS — C3411 Malignant neoplasm of upper lobe, right bronchus or lung: Secondary | ICD-10-CM

## 2017-01-22 DIAGNOSIS — I700 Atherosclerosis of aorta: Secondary | ICD-10-CM | POA: Insufficient documentation

## 2017-01-22 DIAGNOSIS — D1803 Hemangioma of intra-abdominal structures: Secondary | ICD-10-CM | POA: Diagnosis not present

## 2017-01-22 DIAGNOSIS — D6481 Anemia due to antineoplastic chemotherapy: Secondary | ICD-10-CM

## 2017-01-22 DIAGNOSIS — F329 Major depressive disorder, single episode, unspecified: Secondary | ICD-10-CM | POA: Insufficient documentation

## 2017-01-22 LAB — COMPREHENSIVE METABOLIC PANEL
ALBUMIN: 3.8 g/dL (ref 3.5–5.0)
ALK PHOS: 137 U/L (ref 40–150)
ALT: 18 U/L (ref 0–55)
ANION GAP: 10 meq/L (ref 3–11)
AST: 14 U/L (ref 5–34)
BUN: 8.1 mg/dL (ref 7.0–26.0)
CALCIUM: 9.6 mg/dL (ref 8.4–10.4)
CHLORIDE: 104 meq/L (ref 98–109)
CO2: 28 mEq/L (ref 22–29)
CREATININE: 0.9 mg/dL (ref 0.6–1.1)
EGFR: 74 mL/min/{1.73_m2} — ABNORMAL LOW (ref >90)
Glucose: 99 mg/dl (ref 70–140)
POTASSIUM: 3.6 meq/L (ref 3.5–5.1)
Sodium: 142 mEq/L (ref 136–145)
Total Bilirubin: 0.51 mg/dL (ref 0.20–1.20)
Total Protein: 6.9 g/dL (ref 6.4–8.3)

## 2017-01-22 LAB — CBC WITH DIFFERENTIAL/PLATELET
BASO%: 0.2 % (ref 0.0–2.0)
Basophils Absolute: 0 10*3/uL (ref 0.0–0.1)
EOS%: 0.8 % (ref 0.0–7.0)
Eosinophils Absolute: 0.1 10*3/uL (ref 0.0–0.5)
HCT: 36.3 % (ref 34.8–46.6)
HEMOGLOBIN: 12.4 g/dL (ref 11.6–15.9)
LYMPH%: 32.8 % (ref 14.0–49.7)
MCH: 35.6 pg — ABNORMAL HIGH (ref 25.1–34.0)
MCHC: 34.2 g/dL (ref 31.5–36.0)
MCV: 104.3 fL — AB (ref 79.5–101.0)
MONO#: 0.4 10*3/uL (ref 0.1–0.9)
MONO%: 7.1 % (ref 0.0–14.0)
NEUT%: 59.1 % (ref 38.4–76.8)
NEUTROS ABS: 3.7 10*3/uL (ref 1.5–6.5)
Platelets: 216 10*3/uL (ref 145–400)
RBC: 3.48 10*6/uL — AB (ref 3.70–5.45)
RDW: 12.8 % (ref 11.2–14.5)
WBC: 6.2 10*3/uL (ref 3.9–10.3)
lymph#: 2 10*3/uL (ref 0.9–3.3)

## 2017-01-22 MED ORDER — IOPAMIDOL (ISOVUE-300) INJECTION 61%: 100.0000 mL | Freq: Once | INTRAVENOUS | Status: DC | PRN

## 2017-01-22 MED ORDER — IOPAMIDOL (ISOVUE-300) INJECTION 61%
INTRAVENOUS | Status: AC
  Administered 2017-01-22: 100 mL
  Filled 2017-01-22: qty 100

## 2017-01-23 ENCOUNTER — Encounter: Payer: Self-pay | Admitting: Radiation Oncology

## 2017-01-23 ENCOUNTER — Telehealth: Payer: Self-pay | Admitting: *Deleted

## 2017-01-23 ENCOUNTER — Ambulatory Visit
Admission: RE | Admit: 2017-01-23 | Discharge: 2017-01-23 | Disposition: A | Discharge status: Home / Self Care | Payer: Medicare Other | Source: Ambulatory Visit | Attending: Radiation Oncology | Admitting: Radiation Oncology

## 2017-01-23 VITALS — BP 136/94 | HR 92 | Temp 98.9°F | Resp 18 | Wt 139.8 lb

## 2017-01-23 DIAGNOSIS — C3491 Malignant neoplasm of unspecified part of right bronchus or lung: Secondary | ICD-10-CM

## 2017-01-23 NOTE — Progress Notes (Addendum)
Debra Kidd is here today for 1 month follow up for cranial irradiation.  She denies having any pain.  Patient also states no issues with appetite.  She does report having fatigue and states "here legs feel like lead" after mild exertion.  Patient blood pressure elevated today 136/102 left arm 136/94 right arm.  She states she was taking Losartan and was taken off of it during chemo because of hypotension.  Patient denies having any shortness of breath.  Reports occasional non productive cough.  No difficulties eating/drinking/swallowing.  Patient denies headaches, blurred vision, double vision.  Patient reports ringing in ears that was present before radiation and unable to hear out of left ear since radiation.  Patient reports previously having to have ear wax removal in the past.  Unsure if that is the case now.  Patient states she has photosensitivity, but states that is chronic.  Denies being on steroids.    Patient reports constant itching and scalp is peeling.  Reports using Benadryl for itching and allergies.  Vitals:   01/23/17 1056  BP: (!) 136/94  Pulse: 92  Resp: 18  Temp: 98.9 F (37.2 C)  TempSrc: Oral  SpO2: 98%  Weight: 139 lb 12.8 oz (63.4 kg)    Wt Readings from Last 3 Encounters:  01/23/17 139 lb 12.8 oz (63.4 kg)  12/02/16 137 lb 9.6 oz (62.4 kg)  11/25/16 138 lb 9.6 oz (62.9 kg)

## 2017-01-23 NOTE — Progress Notes (Signed)
Radiation Oncology         (336) [phone] ________________________________  Name: Debra Kidd MRN: [id number]  Date: 01/23/2017  DOB: [birth year]  Follow-Up Visit Note  CC: [name], MD  [name], MD    ICD-9-CM ICD-10-CM   1. 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  She denies having any pain.  Patient also states no issues with appetite.  She does report having fatigue and states "her legs feel like lead" after mild exertion.  Patient's blood pressure elevated today 136/102 left arm 136/94 right arm.  She states she was taking Losartan and was taken off of it during chemo because of hypotension. She will see her primary care physician later today to address this issue. Patient denies having any shortness of breath.  Reports occasional non productive cough.  No difficulties eating/drinking/swallowing.  Patient denies headaches, blurred vision, double vision.  Patient reports ringing in ears that was present before radiation and unable to hear out of left ear since radiation.  Patient reports previously having to have ear wax removal in the past.  Unsure if that is the case now.  Patient states she has photosensitivity, but states that is chronic.  Denies steroid use.    Patient reports constant itching and scalp is peeling.  Reports using Benadryl for itching and allergies.                               ALLERGIES:  is allergic to codeine; motrin [ibuprofen]; thiazide-type diuretics; and vicodin [hydrocodone-acetaminophen].  Meds: Current Outpatient Prescriptions  Medication Sig Dispense Refill  . calcium carbonate (TUMS - DOSED IN MG ELEMENTAL CALCIUM) 500 MG chewable tablet Chew 1 tablet by mouth once a week.    . diphenhydrAMINE (BENADRYL) 25 MG tablet Take 25 mg by mouth every 6 (six)  hours as needed.    Marland Kitchen LORazepam (ATIVAN) 1 MG tablet Take 1 mg by mouth 2 (two) times daily.    . traMADol (ULTRAM) 50 MG tablet Take by mouth every 6 (six) hours as needed.    Marland Kitchen emollient (BIAFINE) cream Apply topically 2 (two) times daily.    Marland Kitchen KLOR-CON M20 20 MEQ tablet Take 2 tablets (40 mEq total) by mouth daily. (Patient not taking: Reported on 11/04/2016) 20 tablet 0  . lidocaine-prilocaine (EMLA) cream Apply 1 application topically as needed. (Patient not taking: Reported on 12/02/2016) 30 g 0  . losartan (COZAAR) 50 MG tablet Take 50 mg by mouth daily.    . mirtazapine (REMERON) 30 MG tablet Take 1 tablet (30 mg total) by mouth at bedtime. (Patient not taking: Reported on 11/04/2016) 30 tablet 2  . oxyCODONE-acetaminophen (PERCOCET/ROXICET) 5-325 MG tablet Take 0.5 tablets by mouth every 4 (four) hours as needed for severe pain.    . potassium chloride SA (K-DUR,KLOR-CON) 20 MEQ tablet Take 1 tablet (20 mEq total) by mouth 2 (two) times daily. (Patient not taking: Reported on 11/04/2016) 20 tablet 0  . prochlorperazine (COMPAZINE) 10 MG tablet Take 1 tablet (10 mg total) by mouth every 6 (six) hours as needed for nausea or vomiting. (Patient not taking: Reported on 01/23/2017) 30 tablet 0   No current facility-administered medications for this encounter.     Physical Findings: The  patient is in no acute distress. Patient is alert and oriented.  weight is 139 lb 12.8 oz (63.4 kg). Her oral temperature is 98.9 F (37.2 C). Her blood pressure is 136/94 (abnormal) and her pulse is 92. Her respiration is 18 and oxygen saturation is 98%. . Lungs are clear to auscultation bilaterally. Heart has regular rate and rhythm. No palpable cervical, supraclavicular, or axillary adenopathy. Abdomen soft, non-tender, normal bowel sounds. Oropharynx is clear. No signs of thrush. Examination of both ears revealed mild fluid behind ear drums. No significant ear wax.  Lab Findings: Lab Results  Component Value Date     WBC 6.2 01/22/2017   HGB 12.4 01/22/2017   HCT 36.3 01/22/2017   MCV 104.3 (H) 01/22/2017   PLT 216 01/22/2017    Radiographic Findings: Ct Chest W Contrast  Result Date: 01/22/2017 CLINICAL DATA:  Metastatic small cell carcinoma of the right lung. Restaging assessment. EXAM: CT CHEST, ABDOMEN, AND PELVIS WITH CONTRAST TECHNIQUE: Multidetector CT imaging of the chest, abdomen and pelvis was performed following the standard protocol during bolus administration of intravenous contrast. CONTRAST:  100 cc Isovue-300 COMPARISON:  Multiple exams, including 10/16/2016 FINDINGS: CT CHEST FINDINGS Cardiovascular: Coronary, aortic arch, and branch vessel atherosclerotic vascular disease. Mediastinum/Nodes: A stable 1.0 cm in diameter right hilar lymph node. Indistinct right lower paratracheal node 0.9 cm in short axis, similar to the prior exam. No new adenopathy in the chest. Lungs/Pleura: Right upper lobe bandlike densities similar to prior although the cavitary portion in the apex has resolved. The component on image 33 of series 5 measures 5 mm in thickness, stable. Mild scarring in the lingula and left lower lobe, stable. No new nodule or new mass identified. Musculoskeletal: No change in mild superior endplate scalloping at T5. CT ABDOMEN PELVIS FINDINGS Hepatobiliary: Stable 5 mm flash filling hemangioma along the gallbladder fossa, image 61/2. No clinically significant lesion identified. Pancreas: Unremarkable Spleen: Unremarkable Adrenals/Urinary Tract: Unremarkable Stomach/Bowel: Unremarkable Vascular/Lymphatic: Aortoiliac atherosclerotic vascular disease. No pathologic adenopathy identified. Reproductive: Uterus absent.  No adnexal abnormality. Other: No supplemental non-categorized findings. Musculoskeletal: Low position of the anorectal junction suggesting pelvic floor laxity. There are 4 lumbar type non-rib-bearing vertebra, stable superior endplate compression fracture of L1. L5 is present to be  sacralized. IMPRESSION: 1. No findings of recurrence or specific evidence of active malignancy. Mostly stable scarring in the right upper lobe, the slightly cavitary appearance shown on the prior exam has resolved. No progressive or new worrisome lesions are identified. Stable 1.0 cm right hilar lymph node and stable 0.9 cm right lower paratracheal node, these merit surveillance. 2.  Aortic Atherosclerosis (ICD10-I70.0).  Coronary atherosclerosis. 3. Stable 5 mm flash filling hemangioma along the gallbladder fossa in the liver, appears benign. 4. Suspected pelvic floor laxity.  Sacralized L5 vertebra. Electronically Signed   By: Van Clines M.D.   On: 01/22/2017 16:11   Ct Abdomen Pelvis W Contrast  Result Date: 01/22/2017 CLINICAL DATA:  Metastatic small cell carcinoma of the right lung. Restaging assessment. EXAM: CT CHEST, ABDOMEN, AND PELVIS WITH CONTRAST TECHNIQUE: Multidetector CT imaging of the chest, abdomen and pelvis was performed following the standard protocol during bolus administration of intravenous contrast. CONTRAST:  100 cc Isovue-300 COMPARISON:  Multiple exams, including 10/16/2016 FINDINGS: CT CHEST FINDINGS Cardiovascular: Coronary, aortic arch, and branch vessel atherosclerotic vascular disease. Mediastinum/Nodes: A stable 1.0 cm in diameter right hilar lymph node. Indistinct right lower paratracheal node 0.9 cm in short axis, similar to the prior exam. No new adenopathy  in the chest. Lungs/Pleura: Right upper lobe bandlike densities similar to prior although the cavitary portion in the apex has resolved. The component on image 33 of series 5 measures 5 mm in thickness, stable. Mild scarring in the lingula and left lower lobe, stable. No new nodule or new mass identified. Musculoskeletal: No change in mild superior endplate scalloping at T5. CT ABDOMEN PELVIS FINDINGS Hepatobiliary: Stable 5 mm flash filling hemangioma along the gallbladder fossa, image 61/2. No clinically  significant lesion identified. Pancreas: Unremarkable Spleen: Unremarkable Adrenals/Urinary Tract: Unremarkable Stomach/Bowel: Unremarkable Vascular/Lymphatic: Aortoiliac atherosclerotic vascular disease. No pathologic adenopathy identified. Reproductive: Uterus absent.  No adnexal abnormality. Other: No supplemental non-categorized findings. Musculoskeletal: Low position of the anorectal junction suggesting pelvic floor laxity. There are 4 lumbar type non-rib-bearing vertebra, stable superior endplate compression fracture of L1. L5 is present to be sacralized. IMPRESSION: 1. No findings of recurrence or specific evidence of active malignancy. Mostly stable scarring in the right upper lobe, the slightly cavitary appearance shown on the prior exam has resolved. No progressive or new worrisome lesions are identified. Stable 1.0 cm right hilar lymph node and stable 0.9 cm right lower paratracheal node, these merit surveillance. 2.  Aortic Atherosclerosis (ICD10-I70.0).  Coronary atherosclerosis. 3. Stable 5 mm flash filling hemangioma along the gallbladder fossa in the liver, appears benign. 4. Suspected pelvic floor laxity.  Sacralized L5 vertebra. Electronically Signed   By: Van Clines M.D.   On: 01/22/2017 16:11    Impression:  The patient is recovering from the effects of radiation.  She complains of hearing loss in the left ear. This may be related to Eustachian tube dysfunction. I offered ENT referral but patient would like to wait to see if this clears up on its own.  Plan:  Patient will follow up with radiation oncology in about 3 months. Patient will call back if she wishes to have an ENT referral before that time.  -----------------------------------  Blair Promise, PhD, MD  This document serves as a record of services personally performed by Gery Pray, MD. It was created on his behalf by Linward Natal, a trained medical scribe. The creation of this record is based on the scribe's  personal observations and the provider's statements to them. This document has been checked and approved by the attending provider.

## 2017-01-23 NOTE — Telephone Encounter (Signed)
Attempted to return patient's call regarding a follow up appt with Dr. Denman George in July. Attempted to contact the patient via home phone number, unable to leave a message. Attempted to contact the patient via cell phone number, voicemail not set up.

## 2017-01-24 ENCOUNTER — Telehealth: Payer: Self-pay | Admitting: *Deleted

## 2017-01-24 NOTE — Telephone Encounter (Signed)
Reached the patient, per the patient she didn't call the office and she is not a Dr. Denman George patient

## 2017-01-29 ENCOUNTER — Encounter: Payer: Self-pay | Admitting: Internal Medicine

## 2017-01-29 ENCOUNTER — Telehealth: Payer: Self-pay | Admitting: Internal Medicine

## 2017-01-29 ENCOUNTER — Ambulatory Visit (HOSPITAL_BASED_OUTPATIENT_CLINIC_OR_DEPARTMENT_OTHER): Payer: Medicare Other | Admitting: Internal Medicine

## 2017-01-29 VITALS — BP 148/87 | HR 88 | Temp 98.7°F | Resp 18 | Ht 63.0 in | Wt 140.9 lb

## 2017-01-29 DIAGNOSIS — C3411 Malignant neoplasm of upper lobe, right bronchus or lung: Secondary | ICD-10-CM

## 2017-01-29 DIAGNOSIS — C3491 Malignant neoplasm of unspecified part of right bronchus or lung: Secondary | ICD-10-CM

## 2017-01-29 NOTE — Progress Notes (Signed)
[facility] [phone]   [phone]  OFFICE PROGRESS NOTE  [name], MD [address] [place] [address]  DIAGNOSIS: Extensive stage (T2a, N3, [name], large right anterior mediastinal and supraclavicular lymphadenopathy as well as pancreatic and splenic metastasis diagnosed in September 2017.  PRIOR THERAPY:  1) Systemic chemotherapy was carboplatin for AUC of 5 on day 1 and etoposide 100 MG/M2 on days 1, 2 and 3 with Neulasta support. Status post 6 cycles with significant response of her disease. 2) prophylactic cranial irradiation under the care of Dr. Sondra Come on 12/02/2016.  CURRENT THERAPY: Observation.  INTERVAL HISTORY: Debra Kidd 56 y.o. female returns to the clinic today for follow-up visit accompanied by her husband. The patient completed prophylactic cranial irradiation under the care of Dr. Sondra Come. She is feeling much better today. She denied having any chest pain, shortness breath, cough or hemoptysis. She has no fever or chills. She denied having any nausea, vomiting, diarrhea or constipation. She gained few pounds since her last visit. She had repeat CT scan of the chest, abdomen and pelvis performed recently and she is here for evaluation and discussion of her scan results.   MEDICAL HISTORY: Past Medical History:  Diagnosis Date  . 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 Blood type, Rh positive   . Cancer ([place])   . Chronic pain syndrome   . Depression 08/07/2016  . Encounter for antineoplastic chemotherapy 07/17/2016  . History of external beam radiation therapy 11/19/16-12/02/16   brain 25 Gy in 10 fractions  . Hyperlipidemia   . Hypertension   . Hypertension 08/07/2016  . Osteoporosis     ALLERGIES:  is allergic to codeine; motrin [ibuprofen]; thiazide-type diuretics; and vicodin [hydrocodone-acetaminophen].  MEDICATIONS:  Current Outpatient  Prescriptions  Medication Sig Dispense Refill  . diphenhydrAMINE (BENADRYL) 25 MG tablet Take 25 mg by mouth every 6 (six) hours as needed.    . lidocaine-prilocaine (EMLA) cream Apply 1 application topically as needed. 30 g 0  . LORazepam (ATIVAN) 1 MG tablet Take 1 mg by mouth 2 (two) times daily.    . traMADol (ULTRAM) 50 MG tablet Take by mouth every 6 (six) hours as needed.     No current facility-administered medications for this visit.     SURGICAL HISTORY:  Past Surgical History:  Procedure Laterality Date  . ABDOMINAL HYSTERECTOMY  1981  . APPENDECTOMY     at age 75  . EYE SURGERY     left  . IR GENERIC HISTORICAL  06/03/2016   IR FLUORO GUIDE PORT INSERTION RIGHT 06/03/2016 [facility]  . IR GENERIC HISTORICAL  06/03/2016   IR US GUIDE VASC ACCESS RIGHT 06/03/2016 [facility]  . SKIN CANCER EXCISION     basal cell carcinoma L side of face    REVIEW OF SYSTEMS:  A comprehensive review of systems was negative.   PHYSICAL EXAMINATION: General appearance: alert, cooperative and no distress Head: Normocephalic, without obvious abnormality, atraumatic Neck: no adenopathy, no JVD, supple, symmetrical, trachea midline and thyroid not enlarged, symmetric, no tenderness/mass/nodules Lymph nodes: Cervical, supraclavicular, and axillary nodes normal. Resp: clear to auscultation bilaterally Back: symmetric, no curvature. ROM normal. No CVA tenderness. Cardio: regular rate and rhythm, S1, S2 normal, no murmur, click, rub or gallop GI: soft, non-tender; bowel sounds normal; no masses,  no organomegaly Extremities: extremities normal, atraumatic,  no cyanosis or edema  ECOG PERFORMANCE STATUS: 0 - Asymptomatic  Blood pressure (!) 148/87, pulse 88, temperature 98.7 F (37.1 C), temperature source Oral, resp. rate 18, height 5\' 3"  (1.6 m), weight 140 lb 14.4 oz (63.9 kg), SpO2 98 %.  LABORATORY DATA: Lab Results  Component Value Date   WBC 6.2 01/22/2017   HGB 12.4 01/22/2017    HCT 36.3 01/22/2017   MCV 104.3 (H) 01/22/2017   PLT 216 01/22/2017      Chemistry      Component Value Date/Time   NA 142 01/22/2017 1029   K 3.6 01/22/2017 1029   CL 99 (L) 06/03/2016 1000   CO2 28 01/22/2017 1029   BUN 8.1 01/22/2017 1029   CREATININE 0.9 01/22/2017 1029      Component Value Date/Time   CALCIUM 9.6 01/22/2017 1029   ALKPHOS 137 01/22/2017 1029   AST 14 01/22/2017 1029   ALT 18 01/22/2017 1029   BILITOT 0.51 01/22/2017 1029       RADIOGRAPHIC STUDIES: Ct Chest W Contrast  Result Date: 01/22/2017 CLINICAL DATA:  Metastatic small cell carcinoma of the right lung. Restaging assessment. EXAM: CT CHEST, ABDOMEN, AND PELVIS WITH CONTRAST TECHNIQUE: Multidetector CT imaging of the chest, abdomen and pelvis was performed following the standard protocol during bolus administration of intravenous contrast. CONTRAST:  100 cc Isovue-300 COMPARISON:  Multiple exams, including 10/16/2016 FINDINGS: CT CHEST FINDINGS Cardiovascular: Coronary, aortic arch, and branch vessel atherosclerotic vascular disease. Mediastinum/Nodes: A stable 1.0 cm in diameter right hilar lymph node. Indistinct right lower paratracheal node 0.9 cm in short axis, similar to the prior exam. No new adenopathy in the chest. Lungs/Pleura: Right upper lobe bandlike densities similar to prior although the cavitary portion in the apex has resolved. The component on image 33 of series 5 measures 5 mm in thickness, stable. Mild scarring in the lingula and left lower lobe, stable. No new nodule or new mass identified. Musculoskeletal: No change in mild superior endplate scalloping at T5. CT ABDOMEN PELVIS FINDINGS Hepatobiliary: Stable 5 mm flash filling hemangioma along the gallbladder fossa, image 61/2. No clinically significant lesion identified. Pancreas: Unremarkable Spleen: Unremarkable Adrenals/Urinary Tract: Unremarkable Stomach/Bowel: Unremarkable Vascular/Lymphatic: Aortoiliac atherosclerotic vascular disease.  No pathologic adenopathy identified. Reproductive: Uterus absent.  No adnexal abnormality. Other: No supplemental non-categorized findings. Musculoskeletal: Low position of the anorectal junction suggesting pelvic floor laxity. There are 4 lumbar type non-rib-bearing vertebra, stable superior endplate compression fracture of L1. L5 is present to be sacralized. IMPRESSION: 1. No findings of recurrence or specific evidence of active malignancy. Mostly stable scarring in the right upper lobe, the slightly cavitary appearance shown on the prior exam has resolved. No progressive or new worrisome lesions are identified. Stable 1.0 cm right hilar lymph node and stable 0.9 cm right lower paratracheal node, these merit surveillance. 2.  Aortic Atherosclerosis (ICD10-I70.0).  Coronary atherosclerosis. 3. Stable 5 mm flash filling hemangioma along the gallbladder fossa in the liver, appears benign. 4. Suspected pelvic floor laxity.  Sacralized L5 vertebra. Electronically Signed   By: Van Clines M.D.   On: 01/22/2017 16:11   Ct Abdomen Pelvis W Contrast  Result Date: 01/22/2017 CLINICAL DATA:  Metastatic small cell carcinoma of the right lung. Restaging assessment. EXAM: CT CHEST, ABDOMEN, AND PELVIS WITH CONTRAST TECHNIQUE: Multidetector CT imaging of the chest, abdomen and pelvis was performed following the standard protocol during bolus administration of intravenous contrast. CONTRAST:  100 cc Isovue-300 COMPARISON:  Multiple exams, including 10/16/2016 FINDINGS: CT CHEST  FINDINGS Cardiovascular: Coronary, aortic arch, and branch vessel atherosclerotic vascular disease. Mediastinum/Nodes: A stable 1.0 cm in diameter right hilar lymph node. Indistinct right lower paratracheal node 0.9 cm in short axis, similar to the prior exam. No new adenopathy in the chest. Lungs/Pleura: Right upper lobe bandlike densities similar to prior although the cavitary portion in the apex has resolved. The component on image 33 of  series 5 measures 5 mm in thickness, stable. Mild scarring in the lingula and left lower lobe, stable. No new nodule or new mass identified. Musculoskeletal: No change in mild superior endplate scalloping at T5. CT ABDOMEN PELVIS FINDINGS Hepatobiliary: Stable 5 mm flash filling hemangioma along the gallbladder fossa, image 61/2. No clinically significant lesion identified. Pancreas: Unremarkable Spleen: Unremarkable Adrenals/Urinary Tract: Unremarkable Stomach/Bowel: Unremarkable Vascular/Lymphatic: Aortoiliac atherosclerotic vascular disease. No pathologic adenopathy identified. Reproductive: Uterus absent.  No adnexal abnormality. Other: No supplemental non-categorized findings. Musculoskeletal: Low position of the anorectal junction suggesting pelvic floor laxity. There are 4 lumbar type non-rib-bearing vertebra, stable superior endplate compression fracture of L1. L5 is present to be sacralized. IMPRESSION: 1. No findings of recurrence or specific evidence of active malignancy. Mostly stable scarring in the right upper lobe, the slightly cavitary appearance shown on the prior exam has resolved. No progressive or new worrisome lesions are identified. Stable 1.0 cm right hilar lymph node and stable 0.9 cm right lower paratracheal node, these merit surveillance. 2.  Aortic Atherosclerosis (ICD10-I70.0).  Coronary atherosclerosis. 3. Stable 5 mm flash filling hemangioma along the gallbladder fossa in the liver, appears benign. 4. Suspected pelvic floor laxity.  Sacralized L5 vertebra. Electronically Signed   By: Van Clines M.D.   On: 01/22/2017 16:11    ASSESSMENT AND PLAN:  This is a very pleasant 56 years old white female with extensive stage small cell lung cancer status post systemic chemotherapy with carbo platinum and etoposide for 6 cycles and the patient rotated her treatment well except for chemotherapy-induced anemia and requirement for PRBCs and platelet transfusion. She had significant  improvement in her disease with the chemotherapy. She also had prophylactic cranial irradiation. The patient is currently on observation and she is feeling fine. Her recent CT scan of the chest, abdomen and pelvis showed no evidence for disease progression. I discussed the scan results with the patient and her husband. I recommended for her to continue on observation with repeat CT scan of the chest, abdomen and pelvis in 3 months. The patient was advised to call immediately if she has any concerning symptoms in the interval. The patient voices understanding of current disease status and treatment options and is in agreement with the current care plan. All questions were answered. The patient knows to call the clinic with any problems, questions or concerns. We can certainly see the patient much sooner if necessary. I spent 10 minutes counseling the patient face to face. The total time spent in the appointment was 15 minutes. Disclaimer: This note was dictated with voice recognition software. Similar sounding words can inadvertently be transcribed and may not be corrected upon review.

## 2017-01-29 NOTE — Telephone Encounter (Signed)
Gave patient AVS and calender per 5/30 LOS. Lab and follow up in 3 months. [facility] to contact patient with Ct schedule.

## 2017-01-29 NOTE — Patient Instructions (Signed)
Coping with Quitting Smoking Quitting smoking is a physical and mental challenge. You will face cravings, withdrawal symptoms, and temptation. Before quitting, work with your health care provider to make a plan that can help you cope. Preparation can help you quit and keep you from giving in. How can I cope with cravings? Cravings usually last for 5-10 minutes. If you get through it, the craving will pass. Consider taking the following actions to help you cope with cravings:  Keep your mouth busy:  Chew sugar-free gum.  Suck on hard candies or a straw.  Brush your teeth.  Keep your hands and body busy:  Immediately change to a different activity when you feel a craving.  Squeeze or play with a ball.  Do an activity or a hobby, like making bead jewelry, practicing needlepoint, or working with wood.  Mix up your normal routine.  Take a short exercise break. Go for a quick walk or run up and down stairs.  Spend time in public places where smoking is not allowed.  Focus on doing something kind or helpful for someone else.  Call a friend or family member to talk during a craving.  Join a support group.  Call a quit line, such as 1-800-QUIT-NOW.  Talk with your health care provider about medicines that might help you cope with cravings and make quitting easier for you. How can I deal with withdrawal symptoms? Your body may experience negative effects as it tries to get used to not having nicotine in the system. These effects are called withdrawal symptoms. They may include:  Feeling hungrier than normal.  Trouble concentrating.  Irritability.  Trouble sleeping.  Feeling depressed.  Restlessness and agitation.  Craving a cigarette. 1.  To manage withdrawal symptoms:  Avoid places, people, and activities that trigger your cravings.  Remember why you want to quit.  Get plenty of sleep.  Avoid coffee and other caffeinated drinks. These may worsen some of your  symptoms. How can I handle social situations? Social situations can be difficult when you are quitting smoking, especially in the first few weeks. To manage this, you can:  Avoid parties, bars, and other social situations where people might be smoking.  Avoid alcohol.  Leave right away if you have the urge to smoke.  Explain to your family and friends that you are quitting smoking. Ask for understanding and support.  Plan activities with friends or family where smoking is not an option. What are some ways I can cope with stress? Wanting to smoke may cause stress, and stress can make you want to smoke. Find ways to manage your stress. Relaxation techniques can help. For example:  Breathe slowly and deeply, in through your nose and out through your mouth.  Listen to soothing, relaxing music.  Talk with a family member or friend about your stress.  Light a candle.  Soak in a bath or take a shower.  Think about a peaceful place. What are some ways I can prevent weight gain? Be aware that many people gain weight after they quit smoking. However, not everyone does. To keep from gaining weight, have a plan in place before you quit and stick to the plan after you quit. Your plan should include:  Having healthy snacks. When you have a craving, it may help to:  Eat plain popcorn, crunchy carrots, celery, or other cut vegetables.  Chew sugar-free gum.  Changing how you eat:  Eat small portion sizes at meals.  Eat 4-6 small  meals throughout the day instead of 1-2 large meals a day.  Be mindful when you eat. Do not watch television or do other things that might distract you as you eat.  Exercising regularly:  Make time to exercise each day. If you do not have time for a long workout, do short bouts of exercise for 5-10 minutes several times a day.  Do some form of strengthening exercise, like weight lifting, and some form of aerobic exercise, like running or swimming.  Drinking  plenty of water or other low-calorie or no-calorie drinks. Drink 6-8 glasses of water daily, or as much as instructed by your health care provider. Summary  Quitting smoking is a physical and mental challenge. You will face cravings, withdrawal symptoms, and temptation to smoke again. Preparation can help you as you go through these challenges.  You can cope with cravings by keeping your mouth busy (such as by chewing gum), keeping your body and hands busy, and making calls to family, friends, or a helpline for people who want to quit smoking.  You can cope with withdrawal symptoms by avoiding places where people smoke, avoiding drinks with caffeine, and getting plenty of rest.  Ask your health care provider about the different ways to prevent weight gain, avoid stress, and handle social situations. This information is not intended to replace advice given to you by your health care provider. Make sure you discuss any questions you have with your health care provider. Document Released: 08/16/2016 Document Revised: 08/16/2016 Document Reviewed: 08/16/2016 Elsevier Interactive Patient Education  2017 [employer].

## 2017-04-29 ENCOUNTER — Ambulatory Visit (HOSPITAL_COMMUNITY): Payer: Medicare Other

## 2017-04-29 ENCOUNTER — Other Ambulatory Visit (HOSPITAL_BASED_OUTPATIENT_CLINIC_OR_DEPARTMENT_OTHER): Payer: Medicare Other

## 2017-04-29 ENCOUNTER — Ambulatory Visit (HOSPITAL_COMMUNITY)
Admission: RE | Admit: 2017-04-29 | Discharge: 2017-04-29 | Disposition: A | Discharge status: Home / Self Care | Payer: Medicare Other | Source: Ambulatory Visit | Attending: Internal Medicine | Admitting: Internal Medicine

## 2017-04-29 DIAGNOSIS — C3411 Malignant neoplasm of upper lobe, right bronchus or lung: Secondary | ICD-10-CM | POA: Diagnosis not present

## 2017-04-29 DIAGNOSIS — R918 Other nonspecific abnormal finding of lung field: Secondary | ICD-10-CM | POA: Diagnosis not present

## 2017-04-29 DIAGNOSIS — C3491 Malignant neoplasm of unspecified part of right bronchus or lung: Secondary | ICD-10-CM

## 2017-04-29 DIAGNOSIS — I700 Atherosclerosis of aorta: Secondary | ICD-10-CM | POA: Insufficient documentation

## 2017-04-29 DIAGNOSIS — J439 Emphysema, unspecified: Secondary | ICD-10-CM | POA: Insufficient documentation

## 2017-04-29 LAB — COMPREHENSIVE METABOLIC PANEL
ALT: 23 U/L (ref 0–55)
AST: 19 U/L (ref 5–34)
Albumin: 3.9 g/dL (ref 3.5–5.0)
Alkaline Phosphatase: 140 U/L (ref 40–150)
Anion Gap: 10 mEq/L (ref 3–11)
BUN: 5.5 mg/dL — AB (ref 7.0–26.0)
CHLORIDE: 101 meq/L (ref 98–109)
CO2: 28 meq/L (ref 22–29)
Calcium: 10.1 mg/dL (ref 8.4–10.4)
Creatinine: 0.8 mg/dL (ref 0.6–1.1)
EGFR: 79 mL/min/{1.73_m2} — ABNORMAL LOW (ref >90)
GLUCOSE: 99 mg/dL (ref 70–140)
POTASSIUM: 3.5 meq/L (ref 3.5–5.1)
SODIUM: 140 meq/L (ref 136–145)
Total Bilirubin: 0.39 mg/dL (ref 0.20–1.20)
Total Protein: 7.6 g/dL (ref 6.4–8.3)

## 2017-04-29 LAB — CBC WITH DIFFERENTIAL/PLATELET
BASO%: 0.4 % (ref 0.0–2.0)
Basophils Absolute: 0 10*3/uL (ref 0.0–0.1)
EOS%: 1 % (ref 0.0–7.0)
Eosinophils Absolute: 0.1 10*3/uL (ref 0.0–0.5)
HCT: 37.5 % (ref 34.8–46.6)
HGB: 12.6 g/dL (ref 11.6–15.9)
LYMPH%: 29.6 % (ref 14.0–49.7)
MCH: 35.3 pg — AB (ref 25.1–34.0)
MCHC: 33.6 g/dL (ref 31.5–36.0)
MCV: 105 fL — ABNORMAL HIGH (ref 79.5–101.0)
MONO#: 0.5 10*3/uL (ref 0.1–0.9)
MONO%: 6.6 % (ref 0.0–14.0)
NEUT%: 62.4 % (ref 38.4–76.8)
NEUTROS ABS: 4.8 10*3/uL (ref 1.5–6.5)
Platelets: 277 10*3/uL (ref 145–400)
RBC: 3.57 10*6/uL — AB (ref 3.70–5.45)
RDW: 12.4 % (ref 11.2–14.5)
WBC: 7.6 10*3/uL (ref 3.9–10.3)
lymph#: 2.3 10*3/uL (ref 0.9–3.3)
nRBC: 0 % (ref 0–0)

## 2017-04-29 MED ORDER — IOPAMIDOL (ISOVUE-300) INJECTION 61%
100.0000 mL | Freq: Once | INTRAVENOUS | Status: AC | PRN
  Administered 2017-04-29: 100 mL via INTRAVENOUS

## 2017-04-29 MED ORDER — IOPAMIDOL (ISOVUE-300) INJECTION 61%
INTRAVENOUS | Status: AC
  Filled 2017-04-29: qty 100

## 2017-05-01 ENCOUNTER — Telehealth: Payer: Self-pay | Admitting: Oncology

## 2017-05-01 ENCOUNTER — Telehealth: Payer: Self-pay

## 2017-05-01 ENCOUNTER — Encounter: Payer: Self-pay | Admitting: Internal Medicine

## 2017-05-01 ENCOUNTER — Ambulatory Visit (HOSPITAL_BASED_OUTPATIENT_CLINIC_OR_DEPARTMENT_OTHER): Payer: Medicare Other | Admitting: Internal Medicine

## 2017-05-01 ENCOUNTER — Ambulatory Visit: Admission: RE | Admit: 2017-05-01 | Payer: Medicare Other | Source: Ambulatory Visit | Admitting: Radiation Oncology

## 2017-05-01 VITALS — BP 143/96 | HR 95 | Temp 98.7°F | Resp 18 | Wt 141.0 lb

## 2017-05-01 DIAGNOSIS — Z85118 Personal history of other malignant neoplasm of bronchus and lung: Secondary | ICD-10-CM

## 2017-05-01 DIAGNOSIS — C3491 Malignant neoplasm of unspecified part of right bronchus or lung: Secondary | ICD-10-CM

## 2017-05-01 NOTE — Telephone Encounter (Signed)
Gave patient avs and calender for November and December. Per los

## 2017-05-01 NOTE — Progress Notes (Signed)
[facility] [phone]   [phone]  OFFICE PROGRESS NOTE  [name], MD [address] [place] [address]  DIAGNOSIS: Extensive stage (T2a, N3, [name], large right anterior mediastinal and supraclavicular lymphadenopathy as well as pancreatic and splenic metastasis diagnosed in September 2017.  PRIOR THERAPY:  1) Systemic chemotherapy was carboplatin for AUC of 5 on day 1 and etoposide 100 MG/M2 on days 1, 2 and 3 with Neulasta support. Status post 6 cycles with significant response of her disease. 2) prophylactic cranial irradiation under the care of Dr. Sondra Come on 12/02/2016.  CURRENT THERAPY: Observation.  INTERVAL HISTORY: Debra Kidd 55 y.o. female returns to the clinic today for three-month follow-up visit accompanied by her husband. The patient is feeling fine today with no specific complaints. She denied having any chest pain, shortness of breath, cough or hemoptysis. She denied having any fever or chills. She has no significant weight loss or night sweats. She denied having any nausea, vomiting, diarrhea or constipation. The patient had repeat CT scan of the chest, abdomen and pelvis performed recently and she is here for evaluation and discussion of her scan results.   MEDICAL HISTORY: Past Medical History:  Diagnosis Date  . 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 Blood type, Rh positive   . Cancer ([place])   . Chronic pain syndrome   . Depression 08/07/2016  . Encounter for antineoplastic chemotherapy 07/17/2016  . History of external beam radiation therapy 11/19/16-12/02/16   brain 25 Gy in 10 fractions  . Hyperlipidemia   . Hypertension   . Hypertension 08/07/2016  . Osteoporosis     ALLERGIES:  is allergic to codeine; motrin [ibuprofen]; thiazide-type diuretics; and vicodin [hydrocodone-acetaminophen].  MEDICATIONS:  Current Outpatient Prescriptions    Medication Sig Dispense Refill  . diphenhydrAMINE (BENADRYL) 25 MG tablet Take 25 mg by mouth every 6 (six) hours as needed.    . lidocaine-prilocaine (EMLA) cream Apply 1 application topically as needed. 30 g 0  . LORazepam (ATIVAN) 1 MG tablet Take 1 mg by mouth 2 (two) times daily.    . traMADol (ULTRAM) 50 MG tablet Take by mouth every 6 (six) hours as needed.     No current facility-administered medications for this visit.     SURGICAL HISTORY:  Past Surgical History:  Procedure Laterality Date  . ABDOMINAL HYSTERECTOMY  1981  . APPENDECTOMY     at age 13  . EYE SURGERY     left  . IR GENERIC HISTORICAL  06/03/2016   IR FLUORO GUIDE PORT INSERTION RIGHT 06/03/2016 [facility]  . IR GENERIC HISTORICAL  06/03/2016   IR US GUIDE VASC ACCESS RIGHT 06/03/2016 [facility]  . SKIN CANCER EXCISION     basal cell carcinoma L side of face    REVIEW OF SYSTEMS:  A comprehensive review of systems was negative.   PHYSICAL EXAMINATION: General appearance: alert, cooperative and no distress Head: Normocephalic, without obvious abnormality, atraumatic Neck: no adenopathy, no JVD, supple, symmetrical, trachea midline and thyroid not enlarged, symmetric, no tenderness/mass/nodules Lymph nodes: Cervical, supraclavicular, and axillary nodes normal. Resp: clear to auscultation bilaterally Back: symmetric, no curvature. ROM normal. No CVA tenderness. Cardio: regular rate and rhythm, S1, S2 normal, no murmur, click, rub or gallop GI: soft, non-tender; bowel sounds normal; no masses,  no organomegaly Extremities: extremities normal, atraumatic, no cyanosis  or edema  ECOG PERFORMANCE STATUS: 0 - Asymptomatic  Blood pressure (!) 143/96, pulse 95, temperature 98.7 F (37.1 C), temperature source Oral, resp. rate 18, weight 141 lb (64 kg), SpO2 100 %.  LABORATORY DATA: Lab Results  Component Value Date   WBC 7.6 04/29/2017   HGB 12.6 04/29/2017   HCT 37.5 04/29/2017   MCV 105.0 (H)  04/29/2017   PLT 277 04/29/2017      Chemistry      Component Value Date/Time   NA 140 04/29/2017 0942   K 3.5 04/29/2017 0942   CL 99 (L) 06/03/2016 1000   CO2 28 04/29/2017 0942   BUN 5.5 (L) 04/29/2017 0942   CREATININE 0.8 04/29/2017 0942      Component Value Date/Time   CALCIUM 10.1 04/29/2017 0942   ALKPHOS 140 04/29/2017 0942   AST 19 04/29/2017 0942   ALT 23 04/29/2017 0942   BILITOT 0.39 04/29/2017 0942       RADIOGRAPHIC STUDIES: Ct Chest W Contrast  Result Date: 04/29/2017 CLINICAL DATA:  Small cell lung cancer. Initial diagnosis September 2017. Radiation and chemotherapy completed. EXAM: CT CHEST, ABDOMEN, AND PELVIS WITH CONTRAST TECHNIQUE: Multidetector CT imaging of the chest, abdomen and pelvis was performed following the standard protocol during bolus administration of intravenous contrast. CONTRAST:  141mL ISOVUE-300 IOPAMIDOL (ISOVUE-300) INJECTION 61% COMPARISON:  01/22/2017 FINDINGS: CT CHEST FINDINGS Cardiovascular: The heart is normal in size. No pericardial effusion. Stable prominent pericardial and epicardial fat. The aorta is normal in caliber. Stable atherosclerotic calcifications. Stable coronary artery calcifications. Mediastinum/Nodes: No mediastinal or hilar mass or lymphadenopathy. Stable 10 mm right hilar lymph node on image number 25. Stable 9 mm right paratracheal lymph node on image number 22. The esophagus is unremarkable. Small hiatal hernia. Lungs/Pleura: Stable emphysematous changes and pulmonary scarring. Two 4 mm right apical densities are stable. No findings suspicious for recurrent tumor. No new pulmonary lesions to suggest pulmonary metastatic disease. No pleural effusions. Musculoskeletal: No breast masses, supraclavicular or axillary lymphadenopathy. The thyroid gland is stable. Calcified nodule in the right lobe is unchanged. The right Port-A-Cath is stable. No significant bony findings. No evidence of osseous metastatic disease. CT ABDOMEN  PELVIS FINDINGS Hepatobiliary: No focal hepatic lesions or intrahepatic biliary dilatation. The gallbladder is normal. No common bile duct dilatation. Pancreas: No mass, inflammation or ductal dilatation. Mild atrophy of the pancreas. Spleen: The spleen is normal in size.  No focal lesions. Adrenals/Urinary Tract: The adrenal glands and kidneys are unremarkable. The bladder is normal. Stomach/Bowel: The stomach, duodenum, small bowel and colon are unremarkable. No acute inflammatory changes, mass lesions or obstructive findings. AC appearance of the mesenteric a on the right side just below the duodenum. This is a nonspecific finding but could represent benign mesenteritis or possibly a small mesenteric infarct. A could be the cause of the patient's right-sided abdominal pain. No findings for vascular abnormality such as vascular thrombosis. No mesenteric mass or adenopathy. Vascular/Lymphatic: The aorta is normal in caliber. Stable atherosclerotic calcifications, most notably distally and in the proximal right iliac artery. No dissection. The branch vessels are patent. The major venous structures appear patent. No mesenteric or retroperitoneal mass or lymphadenopathy. Reproductive: Surgically absent. Other: No pelvic mass or adenopathy. No free pelvic fluid collections. No inguinal mass or adenopathy. No abdominal wall hernia or subcutaneous lesions. Musculoskeletal: No significant bony findings. Remote compression deformity of L2 is again demonstrated. IMPRESSION: 1. No CT findings for recurrent lung cancer or metastatic disease. 2. Stable emphysematous changes  and pulmonary scarring. Right upper lobe nodular densities are unchanged. 3. Hazy appearance of the small bowel mesentery in the right abdomen could be nonspecific mesenteritis or panniculitis. Mesenteric infarct is also possible. No vascular abnormalities are identified. No abdominal/pelvic lymphadenopathy or mass. 4. Stable atherosclerotic calcifications  involving the thoracic and abdominal aorta and branch vessels. No vessel occlusions are seen. 5. No findings for osseous metastatic disease. Electronically Signed   By: Marijo Sanes M.D.   On: 04/29/2017 15:11   Ct Abdomen Pelvis W Contrast  Result Date: 04/29/2017 CLINICAL DATA:  Small cell lung cancer. Initial diagnosis September 2017. Radiation and chemotherapy completed. EXAM: CT CHEST, ABDOMEN, AND PELVIS WITH CONTRAST TECHNIQUE: Multidetector CT imaging of the chest, abdomen and pelvis was performed following the standard protocol during bolus administration of intravenous contrast. CONTRAST:  126mL ISOVUE-300 IOPAMIDOL (ISOVUE-300) INJECTION 61% COMPARISON:  01/22/2017 FINDINGS: CT CHEST FINDINGS Cardiovascular: The heart is normal in size. No pericardial effusion. Stable prominent pericardial and epicardial fat. The aorta is normal in caliber. Stable atherosclerotic calcifications. Stable coronary artery calcifications. Mediastinum/Nodes: No mediastinal or hilar mass or lymphadenopathy. Stable 10 mm right hilar lymph node on image number 25. Stable 9 mm right paratracheal lymph node on image number 22. The esophagus is unremarkable. Small hiatal hernia. Lungs/Pleura: Stable emphysematous changes and pulmonary scarring. Two 4 mm right apical densities are stable. No findings suspicious for recurrent tumor. No new pulmonary lesions to suggest pulmonary metastatic disease. No pleural effusions. Musculoskeletal: No breast masses, supraclavicular or axillary lymphadenopathy. The thyroid gland is stable. Calcified nodule in the right lobe is unchanged. The right Port-A-Cath is stable. No significant bony findings. No evidence of osseous metastatic disease. CT ABDOMEN PELVIS FINDINGS Hepatobiliary: No focal hepatic lesions or intrahepatic biliary dilatation. The gallbladder is normal. No common bile duct dilatation. Pancreas: No mass, inflammation or ductal dilatation. Mild atrophy of the pancreas. Spleen: The  spleen is normal in size.  No focal lesions. Adrenals/Urinary Tract: The adrenal glands and kidneys are unremarkable. The bladder is normal. Stomach/Bowel: The stomach, duodenum, small bowel and colon are unremarkable. No acute inflammatory changes, mass lesions or obstructive findings. AC appearance of the mesenteric a on the right side just below the duodenum. This is a nonspecific finding but could represent benign mesenteritis or possibly a small mesenteric infarct. A could be the cause of the patient's right-sided abdominal pain. No findings for vascular abnormality such as vascular thrombosis. No mesenteric mass or adenopathy. Vascular/Lymphatic: The aorta is normal in caliber. Stable atherosclerotic calcifications, most notably distally and in the proximal right iliac artery. No dissection. The branch vessels are patent. The major venous structures appear patent. No mesenteric or retroperitoneal mass or lymphadenopathy. Reproductive: Surgically absent. Other: No pelvic mass or adenopathy. No free pelvic fluid collections. No inguinal mass or adenopathy. No abdominal wall hernia or subcutaneous lesions. Musculoskeletal: No significant bony findings. Remote compression deformity of L2 is again demonstrated. IMPRESSION: 1. No CT findings for recurrent lung cancer or metastatic disease. 2. Stable emphysematous changes and pulmonary scarring. Right upper lobe nodular densities are unchanged. 3. Hazy appearance of the small bowel mesentery in the right abdomen could be nonspecific mesenteritis or panniculitis. Mesenteric infarct is also possible. No vascular abnormalities are identified. No abdominal/pelvic lymphadenopathy or mass. 4. Stable atherosclerotic calcifications involving the thoracic and abdominal aorta and branch vessels. No vessel occlusions are seen. 5. No findings for osseous metastatic disease. Electronically Signed   By: Marijo Sanes M.D.   On: 04/29/2017 15:11  ASSESSMENT AND PLAN:  This is  a very pleasant 56 years old white female with extensive stage small cell lung cancer status post systemic chemotherapy with carbo platinum and etoposide for 6 cycles and the patient rotated her treatment well except for chemotherapy-induced anemia and requirement for PRBCs and platelet transfusion. She had significant improvement in her disease with the chemotherapy. She also had prophylactic cranial irradiation. The patient is feeling fine today was no specific complaints. She has been observation for the last 6 months. Her recent CT scan of the chest, abdomen and pelvis showed no finding for disease recurrence or metastasis. I discussed the scan results with the patient and her husband and recommended for her to continue on observation with repeat CT scan of the chest, abdomen and pelvis in 3 months. She was advised to call immediately if she has any concerning symptoms in the interval. The patient voices understanding of current disease status and treatment options and is in agreement with the current care plan. All questions were answered. The patient knows to call the clinic with any problems, questions or concerns. We can certainly see the patient much sooner if necessary. I spent 10 minutes counseling the patient face to face. The total time spent in the appointment was 15 minutes. Disclaimer: This note was dictated with voice recognition software. Similar sounding words can inadvertently be transcribed and may not be corrected upon review.

## 2017-05-01 NOTE — Telephone Encounter (Signed)
Attempted to call patient twice about her follow up appointment with Dr. Sondra Come today.  Was not able to leave a voicemail.

## 2017-08-01 ENCOUNTER — Other Ambulatory Visit (HOSPITAL_BASED_OUTPATIENT_CLINIC_OR_DEPARTMENT_OTHER): Payer: Medicare Other

## 2017-08-01 ENCOUNTER — Ambulatory Visit (HOSPITAL_COMMUNITY)
Admission: RE | Admit: 2017-08-01 | Discharge: 2017-08-01 | Disposition: A | Discharge status: Home / Self Care | Payer: Medicare Other | Source: Ambulatory Visit | Attending: Internal Medicine | Admitting: Internal Medicine

## 2017-08-01 DIAGNOSIS — K449 Diaphragmatic hernia without obstruction or gangrene: Secondary | ICD-10-CM | POA: Insufficient documentation

## 2017-08-01 DIAGNOSIS — M4854XA Collapsed vertebra, not elsewhere classified, thoracic region, initial encounter for fracture: Secondary | ICD-10-CM | POA: Diagnosis not present

## 2017-08-01 DIAGNOSIS — C3491 Malignant neoplasm of unspecified part of right bronchus or lung: Secondary | ICD-10-CM | POA: Insufficient documentation

## 2017-08-01 DIAGNOSIS — C3411 Malignant neoplasm of upper lobe, right bronchus or lung: Secondary | ICD-10-CM

## 2017-08-01 DIAGNOSIS — I700 Atherosclerosis of aorta: Secondary | ICD-10-CM | POA: Diagnosis not present

## 2017-08-01 DIAGNOSIS — M4856XA Collapsed vertebra, not elsewhere classified, lumbar region, initial encounter for fracture: Secondary | ICD-10-CM | POA: Diagnosis not present

## 2017-08-01 DIAGNOSIS — M810 Age-related osteoporosis without current pathological fracture: Secondary | ICD-10-CM | POA: Insufficient documentation

## 2017-08-01 LAB — CBC WITH DIFFERENTIAL/PLATELET
BASO%: 0.2 % (ref 0.0–2.0)
Basophils Absolute: 0 10*3/uL (ref 0.0–0.1)
EOS ABS: 0.1 10*3/uL (ref 0.0–0.5)
EOS%: 1.2 % (ref 0.0–7.0)
HEMATOCRIT: 37.8 % (ref 34.8–46.6)
HGB: 12.6 g/dL (ref 11.6–15.9)
LYMPH%: 24.6 % (ref 14.0–49.7)
MCH: 35.2 pg — ABNORMAL HIGH (ref 25.1–34.0)
MCHC: 33.3 g/dL (ref 31.5–36.0)
MCV: 105.6 fL — AB (ref 79.5–101.0)
MONO#: 0.4 10*3/uL (ref 0.1–0.9)
MONO%: 5.3 % (ref 0.0–14.0)
NEUT%: 68.7 % (ref 38.4–76.8)
NEUTROS ABS: 5.8 10*3/uL (ref 1.5–6.5)
PLATELETS: 292 10*3/uL (ref 145–400)
RBC: 3.58 10*6/uL — AB (ref 3.70–5.45)
RDW: 12.6 % (ref 11.2–14.5)
WBC: 8.4 10*3/uL (ref 3.9–10.3)
lymph#: 2.1 10*3/uL (ref 0.9–3.3)

## 2017-08-01 LAB — COMPREHENSIVE METABOLIC PANEL
ALT: 19 U/L (ref 0–55)
AST: 15 U/L (ref 5–34)
Albumin: 3.9 g/dL (ref 3.5–5.0)
Alkaline Phosphatase: 142 U/L (ref 40–150)
Anion Gap: 11 mEq/L (ref 3–11)
BUN: 8 mg/dL (ref 7.0–26.0)
CALCIUM: 9.9 mg/dL (ref 8.4–10.4)
CHLORIDE: 105 meq/L (ref 98–109)
CO2: 25 mEq/L (ref 22–29)
Creatinine: 0.9 mg/dL (ref 0.6–1.1)
EGFR: >60 mL/min/{1.73_m2} (ref >60)
GLUCOSE: 103 mg/dL (ref 70–140)
POTASSIUM: 3.5 meq/L (ref 3.5–5.1)
SODIUM: 141 meq/L (ref 136–145)
Total Bilirubin: 0.35 mg/dL (ref 0.20–1.20)
Total Protein: 7.8 g/dL (ref 6.4–8.3)

## 2017-08-01 MED ORDER — IOPAMIDOL (ISOVUE-300) INJECTION 61%
100.0000 mL | Freq: Once | INTRAVENOUS | Status: AC | PRN
  Administered 2017-08-01: 100 mL via INTRAVENOUS

## 2017-08-01 MED ORDER — IOPAMIDOL (ISOVUE-300) INJECTION 61%
INTRAVENOUS | Status: AC
  Filled 2017-08-01: qty 100

## 2017-08-01 MED ORDER — IOPAMIDOL (ISOVUE-370) INJECTION 76%: 100.0000 mL | Freq: Once | INTRAVENOUS | Status: DC | PRN

## 2017-08-04 ENCOUNTER — Encounter: Payer: Self-pay | Admitting: Internal Medicine

## 2017-08-04 ENCOUNTER — Other Ambulatory Visit: Payer: Medicare Other

## 2017-08-04 ENCOUNTER — Ambulatory Visit (HOSPITAL_BASED_OUTPATIENT_CLINIC_OR_DEPARTMENT_OTHER): Payer: Medicare Other | Admitting: Internal Medicine

## 2017-08-04 ENCOUNTER — Telehealth: Payer: Self-pay | Admitting: Internal Medicine

## 2017-08-04 VITALS — BP 156/94 | HR 92 | Temp 98.1°F | Resp 18 | Ht 63.0 in | Wt 146.4 lb

## 2017-08-04 DIAGNOSIS — D6481 Anemia due to antineoplastic chemotherapy: Secondary | ICD-10-CM | POA: Diagnosis not present

## 2017-08-04 DIAGNOSIS — C3411 Malignant neoplasm of upper lobe, right bronchus or lung: Secondary | ICD-10-CM

## 2017-08-04 DIAGNOSIS — Z23 Encounter for immunization: Secondary | ICD-10-CM

## 2017-08-04 DIAGNOSIS — C3491 Malignant neoplasm of unspecified part of right bronchus or lung: Secondary | ICD-10-CM

## 2017-08-04 MED ORDER — INFLUENZA VAC SPLIT QUAD 0.5 ML IM SUSY
0.5000 mL | PREFILLED_SYRINGE | Freq: Once | INTRAMUSCULAR | Status: AC
  Administered 2017-08-04: 0.5 mL via INTRAMUSCULAR
  Filled 2017-08-04: qty 0.5

## 2017-08-04 NOTE — Addendum Note (Signed)
Addended by: Lucile Crater on: 08/04/2017 09:44 AM   Modules accepted: Orders

## 2017-08-04 NOTE — Telephone Encounter (Signed)
Gave avs and calendar for February 2019 °

## 2017-08-04 NOTE — Progress Notes (Signed)
Debra Kidd [phone]   [phone]  OFFICE PROGRESS NOTE  [name], MD [address] [place] [address]  DIAGNOSIS: Extensive stage (T2a, N3, [name], large right anterior mediastinal and supraclavicular lymphadenopathy as well as pancreatic and splenic metastasis diagnosed in September 2017.  PRIOR THERAPY:  1) Systemic chemotherapy was carboplatin for AUC of 5 on day 1 and etoposide 100 MG/M2 on days 1, 2 and 3 with Neulasta support. Status post 6 cycles with significant response of her disease. 2) prophylactic cranial irradiation under the care of Dr. Sondra Come on 12/02/2016.  CURRENT THERAPY: Observation.  INTERVAL HISTORY: Debra Kidd 56 y.o. female returns to the clinic today for follow-up visit accompanied by her husband.  The patient is feeling fine today with no specific complaints except for the left shoulder pain secondary to rotator cuff problem.  She denied having any chest pain, shortness breath, cough or hemoptysis.  She denied having any fever or chills.  She has no nausea, vomiting, diarrhea or constipation.  She has been on observation for several months and the patient had repeat CT scan of the chest, abdomen and pelvis performed recently and she is here for evaluation and discussion of her scan results.   MEDICAL HISTORY: Past Medical History:  Diagnosis Date  . 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 Blood type, Rh positive   . Cancer ([place])   . Chronic pain syndrome   . Depression 08/07/2016  . Encounter for antineoplastic chemotherapy 07/17/2016  . History of external beam radiation therapy 11/19/16-12/02/16   brain 25 Gy in 10 fractions  . Hyperlipidemia   . Hypertension   . Hypertension 08/07/2016  . Osteoporosis     ALLERGIES:  is allergic to codeine; motrin [ibuprofen]; thiazide-type diuretics; and vicodin  [hydrocodone-acetaminophen].  MEDICATIONS:  Current Outpatient Medications  Medication Sig Dispense Refill  . celecoxib (CELEBREX) 200 MG capsule Take 200 mg by mouth daily.    . diphenhydrAMINE (BENADRYL) 25 MG tablet Take 25 mg by mouth every 6 (six) hours as needed.    . lidocaine-prilocaine (EMLA) cream Apply 1 application topically as needed. 30 g 0  . LORazepam (ATIVAN) 1 MG tablet Take 1 mg by mouth 2 (two) times daily.    . traMADol (ULTRAM) 50 MG tablet Take by mouth every 6 (six) hours as needed.     No current facility-administered medications for this visit.     SURGICAL HISTORY:  Past Surgical History:  Procedure Laterality Date  . ABDOMINAL HYSTERECTOMY  1981  . APPENDECTOMY     at age 39  . EYE SURGERY     left  . IR GENERIC HISTORICAL  06/03/2016   IR FLUORO GUIDE PORT INSERTION RIGHT 06/03/2016 [facility]  . IR GENERIC HISTORICAL  06/03/2016   IR US GUIDE VASC ACCESS RIGHT 06/03/2016 [facility]  . SKIN CANCER EXCISION     basal cell carcinoma L side of face    REVIEW OF SYSTEMS:  A comprehensive review of systems was negative except for: Musculoskeletal: positive for arthralgias   PHYSICAL EXAMINATION: General appearance: alert, cooperative and no distress Head: Normocephalic, without obvious abnormality, atraumatic Neck: no adenopathy, no JVD, supple, symmetrical, trachea midline and thyroid not enlarged, symmetric, no tenderness/mass/nodules Lymph nodes: Cervical, supraclavicular, and axillary nodes normal. Resp: clear to auscultation bilaterally Back: symmetric, no curvature. ROM normal. No  CVA tenderness. Cardio: regular rate and rhythm, S1, S2 normal, no murmur, click, rub or gallop GI: soft, non-tender; bowel sounds normal; no masses,  no organomegaly Extremities: extremities normal, atraumatic, no cyanosis or edema  ECOG PERFORMANCE STATUS: 0 - Asymptomatic  Blood pressure (!) 156/94, pulse 92, temperature 98.1 F (36.7 C), temperature source  Oral, resp. rate 18, height 5\' 3"  (1.6 m), weight 146 lb 6.4 oz (66.4 kg), SpO2 100 %.  LABORATORY DATA: Lab Results  Component Value Date   WBC 8.4 08/01/2017   HGB 12.6 08/01/2017   HCT 37.8 08/01/2017   MCV 105.6 (H) 08/01/2017   PLT 292 08/01/2017      Chemistry      Component Value Date/Time   NA 141 08/01/2017 0835   K 3.5 08/01/2017 0835   CL 99 (L) 06/03/2016 1000   CO2 25 08/01/2017 0835   BUN 8.0 08/01/2017 0835   CREATININE 0.9 08/01/2017 0835      Component Value Date/Time   CALCIUM 9.9 08/01/2017 0835   ALKPHOS 142 08/01/2017 0835   AST 15 08/01/2017 0835   ALT 19 08/01/2017 0835   BILITOT 0.35 08/01/2017 0835       RADIOGRAPHIC STUDIES: Ct Chest W Contrast  Result Date: 08/01/2017 CLINICAL DATA:  Restaging of small cell right lung cancer, treatment response assessment, prior chemotherapy and radiation therapy. EXAM: CT CHEST, ABDOMEN, AND PELVIS WITH CONTRAST TECHNIQUE: Multidetector CT imaging of the chest, abdomen and pelvis was performed following the standard protocol during bolus administration of intravenous contrast. CONTRAST:  130mL ISOVUE-300 IOPAMIDOL (ISOVUE-300) INJECTION 61% COMPARISON:  Multiple exams, including 04/29/2017 FINDINGS: CT CHEST FINDINGS Cardiovascular: Coronary, aortic arch, and branch vessel atherosclerotic vascular disease. Right IJ Port-A-Cath tip: Lower SVC. Mediastinum/Nodes: 1.2 cm rim calcified right thyroid lobe nodule, image 11/2, stable. Stable indistinctly marginated right paratracheal density with attenuation of size of the adjacent azygos vein, the density in this vicinity is difficult to measure due to indistinct margins but measures approximately 8 mm transversely on image 24/2, stable. On prior exams such as 04/11/2016 there is previously a large mass in this region. Mild indistinctness of soft tissue planes along the right hilum with potential for a right hilar lymph node measuring up to 8 mm, previously 9 mm by my  measurement. Small type 1 hiatal hernia. Lungs/Pleura: Progressive rounded nodularity along scarring in the apical segment right upper lobe, nodular component currently measuring 1.1 by 0.7 cm on image 32/4. There is some posterior scarring in the right upper lobe as well which is not appreciably changed. Mild scarring inferiorly in the right middle lobe and posteriorly in the right lower lobe. Musculoskeletal: Stable mild superior endplate compression fracture at the T5 vertebral level. CT ABDOMEN PELVIS FINDINGS Hepatobiliary: Unremarkable Pancreas: Unremarkable Spleen: Unremarkable Adrenals/Urinary Tract: Unremarkable Stomach/Bowel: Unremarkable Vascular/Lymphatic: Aortoiliac atherosclerotic vascular disease. No significant adenopathy in the abdomen/pelvis. Reproductive: Uterus absent.  Adnexa unremarkable. Other: The stranding in the right abdominal mesentery is less confluent than on the prior exam, with only subtle stranding in this vicinity and along the mesenteric root today. No mass lesion identified in this region. Musculoskeletal: Stable mild superior endplate compression at L2. IMPRESSION: 1. Progressive nodularity along scarring in the apical segment right upper lobe, new nodular component measures 1.1 by 0.7 cm. Although quite possibly inflammatory, malignancy is not readily excluded based on today's exam. Percutaneous biopsy might be difficult given the location. Other options include nuclear medicine PET-CT and surveillance. 2. No findings of recurrence of the paratracheal/hilar  component of the mass. 3. Reduced right abdominal mesenteric stranding. 4. Other imaging findings of potential clinical significance: Aortic Atherosclerosis (ICD10-I70.0). Small type 1 hiatal hernia. Old mild compression fractures at T5 and L2 (query osteoporosis). Electronically Signed   By: Van Clines M.D.   On: 08/01/2017 12:35   Ct Abdomen Pelvis W Contrast  Result Date: 08/01/2017 CLINICAL DATA:  Restaging  of small cell right lung cancer, treatment response assessment, prior chemotherapy and radiation therapy. EXAM: CT CHEST, ABDOMEN, AND PELVIS WITH CONTRAST TECHNIQUE: Multidetector CT imaging of the chest, abdomen and pelvis was performed following the standard protocol during bolus administration of intravenous contrast. CONTRAST:  187mL ISOVUE-300 IOPAMIDOL (ISOVUE-300) INJECTION 61% COMPARISON:  Multiple exams, including 04/29/2017 FINDINGS: CT CHEST FINDINGS Cardiovascular: Coronary, aortic arch, and branch vessel atherosclerotic vascular disease. Right IJ Port-A-Cath tip: Lower SVC. Mediastinum/Nodes: 1.2 cm rim calcified right thyroid lobe nodule, image 11/2, stable. Stable indistinctly marginated right paratracheal density with attenuation of size of the adjacent azygos vein, the density in this vicinity is difficult to measure due to indistinct margins but measures approximately 8 mm transversely on image 24/2, stable. On prior exams such as 04/11/2016 there is previously a large mass in this region. Mild indistinctness of soft tissue planes along the right hilum with potential for a right hilar lymph node measuring up to 8 mm, previously 9 mm by my measurement. Small type 1 hiatal hernia. Lungs/Pleura: Progressive rounded nodularity along scarring in the apical segment right upper lobe, nodular component currently measuring 1.1 by 0.7 cm on image 32/4. There is some posterior scarring in the right upper lobe as well which is not appreciably changed. Mild scarring inferiorly in the right middle lobe and posteriorly in the right lower lobe. Musculoskeletal: Stable mild superior endplate compression fracture at the T5 vertebral level. CT ABDOMEN PELVIS FINDINGS Hepatobiliary: Unremarkable Pancreas: Unremarkable Spleen: Unremarkable Adrenals/Urinary Tract: Unremarkable Stomach/Bowel: Unremarkable Vascular/Lymphatic: Aortoiliac atherosclerotic vascular disease. No significant adenopathy in the abdomen/pelvis.  Reproductive: Uterus absent.  Adnexa unremarkable. Other: The stranding in the right abdominal mesentery is less confluent than on the prior exam, with only subtle stranding in this vicinity and along the mesenteric root today. No mass lesion identified in this region. Musculoskeletal: Stable mild superior endplate compression at L2. IMPRESSION: 1. Progressive nodularity along scarring in the apical segment right upper lobe, new nodular component measures 1.1 by 0.7 cm. Although quite possibly inflammatory, malignancy is not readily excluded based on today's exam. Percutaneous biopsy might be difficult given the location. Other options include nuclear medicine PET-CT and surveillance. 2. No findings of recurrence of the paratracheal/hilar component of the mass. 3. Reduced right abdominal mesenteric stranding. 4. Other imaging findings of potential clinical significance: Aortic Atherosclerosis (ICD10-I70.0). Small type 1 hiatal hernia. Old mild compression fractures at T5 and L2 (query osteoporosis). Electronically Signed   By: Van Clines M.D.   On: 08/01/2017 12:35    ASSESSMENT AND PLAN:  This is a very pleasant 57 years old white female with extensive stage small cell lung cancer status post systemic chemotherapy with carbo platinum and etoposide for 6 cycles and the patient rotated her treatment well except for chemotherapy-induced anemia and requirement for PRBCs and platelet transfusion. She had significant improvement in her disease with the chemotherapy. She also had prophylactic cranial irradiation. The patient is currently on observation for the last 9 months and she is feeling fine. She had a repeat CT scan of the chest, abdomen and pelvis that showed no evidence for disease  progression except for new nodular component measuring 1.1 cm in the right upper lobe suspicious for inflammatory process versus early malignancy.  I discussed the scan results with the patient and her husband.  I  recommended for her to continue on observation with repeat CT scan of the chest in 2 months for further evaluation of this lesion.  If the lesion showed further increase in the size, would consider her for further investigation and treatment. The patient and her husband agreed to the current plan. She will receive flu vaccine today. The patient was advised to call immediately if she has any concerning symptoms in the interval. The patient voices understanding of current disease status and treatment options and is in agreement with the current care plan. All questions were answered. The patient knows to call the clinic with any problems, questions or concerns. We can certainly see the patient much sooner if necessary. I spent 10 minutes counseling the patient face to face. The total time spent in the appointment was 15 minutes. Disclaimer: This note was dictated with voice recognition software. Similar sounding words can inadvertently be transcribed and may not be corrected upon review.

## 2017-08-07 ENCOUNTER — Ambulatory Visit: Payer: Medicare Other | Admitting: Internal Medicine

## 2017-10-06 ENCOUNTER — Ambulatory Visit (HOSPITAL_COMMUNITY)
Admission: RE | Admit: 2017-10-06 | Discharge: 2017-10-06 | Disposition: A | Discharge status: Home / Self Care | Payer: Medicare Other | Source: Ambulatory Visit | Attending: Internal Medicine | Admitting: Internal Medicine

## 2017-10-06 ENCOUNTER — Other Ambulatory Visit: Payer: Medicare Other

## 2017-10-06 ENCOUNTER — Inpatient Hospital Stay: Payer: Medicare Other | Attending: Internal Medicine

## 2017-10-06 DIAGNOSIS — C3411 Malignant neoplasm of upper lobe, right bronchus or lung: Secondary | ICD-10-CM | POA: Insufficient documentation

## 2017-10-06 DIAGNOSIS — C778 Secondary and unspecified malignant neoplasm of lymph nodes of multiple regions: Secondary | ICD-10-CM | POA: Diagnosis not present

## 2017-10-06 DIAGNOSIS — D6481 Anemia due to antineoplastic chemotherapy: Secondary | ICD-10-CM | POA: Insufficient documentation

## 2017-10-06 DIAGNOSIS — C3491 Malignant neoplasm of unspecified part of right bronchus or lung: Secondary | ICD-10-CM

## 2017-10-06 DIAGNOSIS — C7889 Secondary malignant neoplasm of other digestive organs: Secondary | ICD-10-CM | POA: Insufficient documentation

## 2017-10-06 DIAGNOSIS — Z923 Personal history of irradiation: Secondary | ICD-10-CM | POA: Insufficient documentation

## 2017-10-06 DIAGNOSIS — I6522 Occlusion and stenosis of left carotid artery: Secondary | ICD-10-CM | POA: Insufficient documentation

## 2017-10-06 DIAGNOSIS — R918 Other nonspecific abnormal finding of lung field: Secondary | ICD-10-CM | POA: Diagnosis not present

## 2017-10-06 DIAGNOSIS — R590 Localized enlarged lymph nodes: Secondary | ICD-10-CM | POA: Diagnosis not present

## 2017-10-06 DIAGNOSIS — F329 Major depressive disorder, single episode, unspecified: Secondary | ICD-10-CM | POA: Insufficient documentation

## 2017-10-06 DIAGNOSIS — I10 Essential (primary) hypertension: Secondary | ICD-10-CM | POA: Diagnosis not present

## 2017-10-06 DIAGNOSIS — Z85828 Personal history of other malignant neoplasm of skin: Secondary | ICD-10-CM | POA: Diagnosis not present

## 2017-10-06 DIAGNOSIS — Z9221 Personal history of antineoplastic chemotherapy: Secondary | ICD-10-CM | POA: Insufficient documentation

## 2017-10-06 DIAGNOSIS — I700 Atherosclerosis of aorta: Secondary | ICD-10-CM | POA: Insufficient documentation

## 2017-10-06 DIAGNOSIS — Z79899 Other long term (current) drug therapy: Secondary | ICD-10-CM | POA: Insufficient documentation

## 2017-10-06 DIAGNOSIS — M810 Age-related osteoporosis without current pathological fracture: Secondary | ICD-10-CM | POA: Insufficient documentation

## 2017-10-06 DIAGNOSIS — E785 Hyperlipidemia, unspecified: Secondary | ICD-10-CM | POA: Diagnosis not present

## 2017-10-06 DIAGNOSIS — G894 Chronic pain syndrome: Secondary | ICD-10-CM | POA: Insufficient documentation

## 2017-10-06 LAB — CBC WITH DIFFERENTIAL/PLATELET
Basophils Absolute: 0 10*3/uL (ref 0.0–0.1)
Basophils Relative: 0 %
EOS PCT: 2 %
Eosinophils Absolute: 0.2 10*3/uL (ref 0.0–0.5)
HCT: 35.6 % (ref 34.8–46.6)
Hemoglobin: 11.8 g/dL (ref 11.6–15.9)
LYMPHS ABS: 1.5 10*3/uL (ref 0.9–3.3)
Lymphocytes Relative: 16 %
MCH: 34.3 pg — AB (ref 25.1–34.0)
MCHC: 33.1 g/dL (ref 31.5–36.0)
MCV: 103.5 fL — AB (ref 79.5–101.0)
MONO ABS: 0.6 10*3/uL (ref 0.1–0.9)
MONOS PCT: 7 %
Neutro Abs: 6.8 10*3/uL — ABNORMAL HIGH (ref 1.5–6.5)
Neutrophils Relative %: 75 %
PLATELETS: 289 10*3/uL (ref 145–400)
RBC: 3.44 MIL/uL — ABNORMAL LOW (ref 3.70–5.45)
RDW: 12.8 % (ref 11.2–14.5)
WBC: 9.1 10*3/uL (ref 3.9–10.3)

## 2017-10-06 LAB — COMPREHENSIVE METABOLIC PANEL
ALBUMIN: 3.4 g/dL — AB (ref 3.5–5.0)
ALT: 19 U/L (ref 0–55)
AST: 18 U/L (ref 5–34)
Alkaline Phosphatase: 148 U/L (ref 40–150)
Anion gap: 10 (ref 3–11)
BILIRUBIN TOTAL: 0.4 mg/dL (ref 0.2–1.2)
BUN: 8 mg/dL (ref 7–26)
CHLORIDE: 103 mmol/L (ref 98–109)
CO2: 26 mmol/L (ref 22–29)
Calcium: 8.9 mg/dL (ref 8.4–10.4)
Creatinine, Ser: 0.9 mg/dL (ref 0.60–1.10)
GFR calc Af Amer: >60 mL/min (ref >60)
GFR calc non Af Amer: >60 mL/min (ref >60)
GLUCOSE: 95 mg/dL (ref 70–140)
POTASSIUM: 3.4 mmol/L — AB (ref 3.5–5.1)
Sodium: 139 mmol/L (ref 136–145)
TOTAL PROTEIN: 7.3 g/dL (ref 6.4–8.3)

## 2017-10-06 MED ORDER — IOPAMIDOL (ISOVUE-300) INJECTION 61%
75.0000 mL | Freq: Once | INTRAVENOUS | Status: AC | PRN
  Administered 2017-10-06: 75 mL via INTRAVENOUS

## 2017-10-06 MED ORDER — IOPAMIDOL (ISOVUE-300) INJECTION 61%
INTRAVENOUS | Status: AC
  Filled 2017-10-06: qty 75

## 2017-10-08 ENCOUNTER — Ambulatory Visit: Payer: Medicare Other | Admitting: Internal Medicine

## 2017-10-13 ENCOUNTER — Inpatient Hospital Stay (HOSPITAL_BASED_OUTPATIENT_CLINIC_OR_DEPARTMENT_OTHER): Payer: Medicare Other | Admitting: Internal Medicine

## 2017-10-13 ENCOUNTER — Encounter: Payer: Self-pay | Admitting: Internal Medicine

## 2017-10-13 ENCOUNTER — Encounter: Payer: Self-pay | Admitting: Radiation Oncology

## 2017-10-13 ENCOUNTER — Telehealth: Payer: Self-pay | Admitting: Internal Medicine

## 2017-10-13 VITALS — BP 134/82 | HR 91 | Temp 98.0°F | Resp 20 | Ht 63.0 in | Wt 155.0 lb

## 2017-10-13 DIAGNOSIS — C7889 Secondary malignant neoplasm of other digestive organs: Secondary | ICD-10-CM

## 2017-10-13 DIAGNOSIS — F329 Major depressive disorder, single episode, unspecified: Secondary | ICD-10-CM

## 2017-10-13 DIAGNOSIS — I700 Atherosclerosis of aorta: Secondary | ICD-10-CM | POA: Diagnosis not present

## 2017-10-13 DIAGNOSIS — I6522 Occlusion and stenosis of left carotid artery: Secondary | ICD-10-CM | POA: Diagnosis not present

## 2017-10-13 DIAGNOSIS — C3411 Malignant neoplasm of upper lobe, right bronchus or lung: Secondary | ICD-10-CM

## 2017-10-13 DIAGNOSIS — E785 Hyperlipidemia, unspecified: Secondary | ICD-10-CM | POA: Diagnosis not present

## 2017-10-13 DIAGNOSIS — C778 Secondary and unspecified malignant neoplasm of lymph nodes of multiple regions: Secondary | ICD-10-CM | POA: Diagnosis not present

## 2017-10-13 DIAGNOSIS — R918 Other nonspecific abnormal finding of lung field: Secondary | ICD-10-CM | POA: Diagnosis not present

## 2017-10-13 DIAGNOSIS — D6481 Anemia due to antineoplastic chemotherapy: Secondary | ICD-10-CM | POA: Diagnosis not present

## 2017-10-13 DIAGNOSIS — M810 Age-related osteoporosis without current pathological fracture: Secondary | ICD-10-CM | POA: Diagnosis not present

## 2017-10-13 DIAGNOSIS — G894 Chronic pain syndrome: Secondary | ICD-10-CM | POA: Diagnosis not present

## 2017-10-13 DIAGNOSIS — Z85828 Personal history of other malignant neoplasm of skin: Secondary | ICD-10-CM

## 2017-10-13 DIAGNOSIS — Z9221 Personal history of antineoplastic chemotherapy: Secondary | ICD-10-CM

## 2017-10-13 DIAGNOSIS — I10 Essential (primary) hypertension: Secondary | ICD-10-CM

## 2017-10-13 DIAGNOSIS — Z79899 Other long term (current) drug therapy: Secondary | ICD-10-CM

## 2017-10-13 DIAGNOSIS — Z923 Personal history of irradiation: Secondary | ICD-10-CM

## 2017-10-13 DIAGNOSIS — C3491 Malignant neoplasm of unspecified part of right bronchus or lung: Secondary | ICD-10-CM

## 2017-10-13 NOTE — Progress Notes (Signed)
[facility] [phone]   Fax:([phone]  OFFICE PROGRESS NOTE  [name], MD [address] [place] [address]  DIAGNOSIS: Extensive stage (T2a, N3, [name], large right anterior mediastinal and supraclavicular lymphadenopathy as well as pancreatic and splenic metastasis diagnosed in September 2017.  PRIOR THERAPY:  1) Systemic chemotherapy was carboplatin for AUC of 5 on day 1 and etoposide 100 MG/M2 on days 1, 2 and 3 with Neulasta support. Status post 6 cycles with significant response of her disease. 2) prophylactic cranial irradiation under the care of Dr. Sondra Come on 12/02/2016.  CURRENT THERAPY: Observation.  INTERVAL HISTORY: Debra Kidd 57 y.o. female returns to the clinic today for follow-up visit accompanied by her husband.  The patient is feeling fine today with no specific complaints.  She denied having any chest pain, shortness breath, cough or hemoptysis.  She denied having any weight loss or night sweats.  She has no nausea, vomiting, diarrhea or constipation.  She has been in observation for several months and the patient has been doing fine.  She had repeat CT scan of the chest performed recently and she is here for evaluation and discussion of her scan results and treatment options.   MEDICAL HISTORY: Past Medical History:  Diagnosis Date  . 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 Blood type, Rh positive   . Cancer ([place])   . Chronic pain syndrome   . Depression 08/07/2016  . Encounter for antineoplastic chemotherapy 07/17/2016  . History of external beam radiation therapy 11/19/16-12/02/16   brain 25 Gy in 10 fractions  . Hyperlipidemia   . Hypertension   . Hypertension 08/07/2016  . Osteoporosis     ALLERGIES:  is allergic to codeine; motrin [ibuprofen]; thiazide-type diuretics; and vicodin [hydrocodone-acetaminophen].  MEDICATIONS:  Current  Outpatient Medications  Medication Sig Dispense Refill  . celecoxib (CELEBREX) 200 MG capsule Take 200 mg by mouth daily.    . diphenhydrAMINE (BENADRYL) 25 MG tablet Take 25 mg by mouth every 6 (six) hours as needed.    . lidocaine-prilocaine (EMLA) cream Apply 1 application topically as needed. 30 g 0  . LORazepam (ATIVAN) 1 MG tablet Take 1 mg by mouth 2 (two) times daily.    . traMADol (ULTRAM) 50 MG tablet Take by mouth every 6 (six) hours as needed.     No current facility-administered medications for this visit.     SURGICAL HISTORY:  Past Surgical History:  Procedure Laterality Date  . ABDOMINAL HYSTERECTOMY  1981  . APPENDECTOMY     at age 55  . EYE SURGERY     left  . IR GENERIC HISTORICAL  06/03/2016   IR FLUORO GUIDE PORT INSERTION RIGHT 06/03/2016 [facility]  . IR GENERIC HISTORICAL  06/03/2016   IR US GUIDE VASC ACCESS RIGHT 06/03/2016 [facility]  . SKIN CANCER EXCISION     basal cell carcinoma L side of face    REVIEW OF SYSTEMS:  Constitutional: negative Eyes: negative Ears, nose, mouth, throat, and face: negative Respiratory: negative Cardiovascular: negative Gastrointestinal: negative Genitourinary:negative Integument/breast: negative Hematologic/lymphatic: negative Musculoskeletal:positive for arthralgias Neurological: negative Behavioral/Psych: negative Endocrine: negative Allergic/Immunologic: negative   PHYSICAL EXAMINATION: General appearance: alert, cooperative and no distress Head: Normocephalic, without obvious abnormality, atraumatic Neck: no adenopathy, no JVD, supple, symmetrical, trachea midline and thyroid not enlarged, symmetric, no tenderness/mass/nodules Lymph nodes: Cervical,  supraclavicular, and axillary nodes normal. Resp: clear to auscultation bilaterally Back: symmetric, no curvature. ROM normal. No CVA tenderness. Cardio: regular rate and rhythm, S1, S2 normal, no murmur, click, rub or gallop GI: soft, non-tender; bowel sounds  normal; no masses,  no organomegaly Extremities: extremities normal, atraumatic, no cyanosis or edema Neurologic: Alert and oriented X 3, normal strength and tone. Normal symmetric reflexes. Normal coordination and gait  ECOG PERFORMANCE STATUS: 1 - Symptomatic but completely ambulatory  Blood pressure 134/82, pulse 91, temperature 98 F (36.7 C), temperature source Oral, resp. rate 20, height 5\' 3"  (1.6 m), weight 155 lb (70.3 kg), SpO2 99 %.  LABORATORY DATA: Lab Results  Component Value Date   WBC 9.1 10/06/2017   HGB 11.8 10/06/2017   HCT 35.6 10/06/2017   MCV 103.5 (H) 10/06/2017   PLT 289 10/06/2017      Chemistry      Component Value Date/Time   NA 139 10/06/2017 1129   NA 141 08/01/2017 0835   K 3.4 (L) 10/06/2017 1129   K 3.5 08/01/2017 0835   CL 103 10/06/2017 1129   CO2 26 10/06/2017 1129   CO2 25 08/01/2017 0835   BUN 8 10/06/2017 1129   BUN 8.0 08/01/2017 0835   CREATININE 0.90 10/06/2017 1129   CREATININE 0.9 08/01/2017 0835      Component Value Date/Time   CALCIUM 8.9 10/06/2017 1129   CALCIUM 9.9 08/01/2017 0835   ALKPHOS 148 10/06/2017 1129   ALKPHOS 142 08/01/2017 0835   AST 18 10/06/2017 1129   AST 15 08/01/2017 0835   ALT 19 10/06/2017 1129   ALT 19 08/01/2017 0835   BILITOT 0.4 10/06/2017 1129   BILITOT 0.35 08/01/2017 0835       RADIOGRAPHIC STUDIES: Ct Chest W Contrast  Result Date: 10/06/2017 CLINICAL DATA:  Small cell lung cancer restaging EXAM: CT CHEST WITH CONTRAST TECHNIQUE: Multidetector CT imaging of the chest was performed during intravenous contrast administration. CONTRAST:  83mL ISOVUE-300 IOPAMIDOL (ISOVUE-300) INJECTION 61% COMPARISON:  Chest CT 08/01/2017 and 04/29/2017 FINDINGS: Cardiovascular: The heart is normal in size. No pericardial effusion. Stable tortuosity and atherosclerotic calcifications involving the thoracic aorta. No dissection or focal aneurysm. Moderate atherosclerotic calcification and estimated 50% stenosis  at the origin of the left carotid artery. Stable coronary artery calcifications. Mediastinum/Nodes: 9 mm precarinal lymph node on image number 54 previously measured 8 mm. 10 mm right hilar lymph node on image number 60 previously measured 8 mm. Small subcarinal nodes are stable. 5.5 mm left hilar lymph node on image number 65 is stable. The esophagus is normal. Lungs/Pleura: The right upper lobe nodular density has increased in size since the prior CT scan. It measures 18 x 12 x 15 mm and previously measured 10.5 x 7.0 x 10.5 cm. Findings consistent with recurrent tumor. No new pulmonary lesions or pulmonary nodules to suggest metastatic disease. No acute pulmonary findings or pleural effusion. Upper Abdomen: No significant upper abdominal findings. No hepatic or adrenal gland metastasis are identified. Scattered upper abdominal lymph nodes appears stable. Stable atherosclerotic calcifications involving the aorta. Musculoskeletal: No lytic or sclerotic bone lesions are identified. There is a stable L1 compression deformity. No breast masses, supraclavicular or axillary adenopathy. The thyroid gland appears normal. The Port-A-Cath is stable. IMPRESSION: 1. Enlarging right upper lobe pulmonary lesion consistent with recurrent tumor. 2. Stable to slightly larger mediastinal and hilar lymph nodes. 3. No evidence of pulmonary metastatic disease or upper abdominal metastatic disease. Aortic Atherosclerosis (ICD10-I70.0). Electronically Signed  By: Marijo Sanes M.D.   On: 10/06/2017 15:04    ASSESSMENT AND PLAN:  This is a very pleasant 57 years old white female with extensive stage small cell lung cancer status post systemic chemotherapy with carbo platinum and etoposide for 6 cycles and the patient rotated her treatment well except for chemotherapy-induced anemia and requirement for PRBCs and platelet transfusion. She had significant improvement in her disease with the chemotherapy. She also had prophylactic  cranial irradiation. She is current on observation and feeling fine. The patient was found on previous CT scan of the chest to have suspicious right upper lobe pulmonary nodule.  She continued on observation and a repeat CT scan of the chest performed recently showed enlargement of the right upper lobe pulmonary lesion consistent with recurrent tumor.  She has no other evidence of progressive metastatic disease in the chest or upper abdomen. I personally and independently reviewed the scan images and discussed the results and showed the images to the patient and her husband. I recommended for her to see Dr. Sondra Come for evaluation and consideration of stereotactic radiotherapy to the solitary enlarging right upper lobe pulmonary nodule. I will see her back for follow-up visit in 3 months for evaluation after repeating CT scan of the chest, abdomen and pelvis for restaging of her disease. The patient was advised to call immediately if she has any concerning symptoms in the interval. The patient voices understanding of current disease status and treatment options and is in agreement with the current care plan. All questions were answered. The patient knows to call the clinic with any problems, questions or concerns. We can certainly see the patient much sooner if necessary.  Disclaimer: This note was dictated with voice recognition software. Similar sounding words can inadvertently be transcribed and may not be corrected upon review.

## 2017-10-13 NOTE — Telephone Encounter (Signed)
No 2/11 los when going to check out patient.

## 2017-10-14 ENCOUNTER — Telehealth: Payer: Self-pay | Admitting: Internal Medicine

## 2017-10-14 NOTE — Telephone Encounter (Signed)
Scheduled appt per 2/11 los - sent reminder letter in the mail. Central radiology to contact patient with ct schedule.

## 2017-10-15 NOTE — Progress Notes (Signed)
Thoracic Location of Tumor / Histology: Extensive stage (T2a, N3, M1b) small cell lung cancer - CT chest from 10/06/17 showed an "enlarging right upper lobe pulmonary lesion."   Biopsies revealed:   05/11/16 Diagnosis Lymph node, needle/core biopsy, right supraclavicular - POSITIVE FOR SMALL CELL CARCINOMA.  Tobacco/Marijuana/Snuff/ETOH use: has smoked 1 ppd for 43 years, denies etoh, does use marijuana once a day.  Past/Anticipated interventions by cardiothoracic surgery, if any: no  Past/Anticipated interventions by medical oncology, if any: no  Signs/Symptoms  Weight changes, if any: reports she has lost a few pounds but has a good appetite.  Respiratory complaints, if any: denies an increase in shortness of breath, has an occasional dry cough  Hemoptysis, if any: no  Pain issues, if any:  no  SAFETY ISSUES:  Prior radiation? 11/19/16-12/02/16 brain 25 Gy in 10 fractions  Pacemaker/ICD? no   Possible current pregnancy?no  Is the patient on methotrexate? no  Current Complaints / other details:  Dr. Julien Nordmann is recommending SBRT.  BP 140/81 (BP Location: Left Arm, Patient Position: Sitting)   Pulse 86   Temp 98.5 F (36.9 C) (Oral)   Ht 5\' 3"  (1.6 m)   Wt 155 lb 12.8 oz (70.7 kg)   SpO2 100%   BMI 27.60 kg/m    Wt Readings from Last 3 Encounters:  10/23/17 155 lb 12.8 oz (70.7 kg)  10/13/17 155 lb (70.3 kg)  08/04/17 146 lb 6.4 oz (66.4 kg)

## 2017-10-23 ENCOUNTER — Encounter: Payer: Self-pay | Admitting: Radiation Oncology

## 2017-10-23 ENCOUNTER — Ambulatory Visit
Admission: RE | Admit: 2017-10-23 | Discharge: 2017-10-23 | Disposition: A | Discharge status: Home / Self Care | Payer: Medicare Other | Source: Ambulatory Visit | Attending: Radiation Oncology | Admitting: Radiation Oncology

## 2017-10-23 DIAGNOSIS — I700 Atherosclerosis of aorta: Secondary | ICD-10-CM | POA: Insufficient documentation

## 2017-10-23 DIAGNOSIS — M4854XA Collapsed vertebra, not elsewhere classified, thoracic region, initial encounter for fracture: Secondary | ICD-10-CM | POA: Insufficient documentation

## 2017-10-23 DIAGNOSIS — K449 Diaphragmatic hernia without obstruction or gangrene: Secondary | ICD-10-CM | POA: Diagnosis not present

## 2017-10-23 DIAGNOSIS — Z9221 Personal history of antineoplastic chemotherapy: Secondary | ICD-10-CM | POA: Diagnosis not present

## 2017-10-23 DIAGNOSIS — I6522 Occlusion and stenosis of left carotid artery: Secondary | ICD-10-CM | POA: Diagnosis not present

## 2017-10-23 DIAGNOSIS — Z79899 Other long term (current) drug therapy: Secondary | ICD-10-CM | POA: Diagnosis not present

## 2017-10-23 DIAGNOSIS — C3411 Malignant neoplasm of upper lobe, right bronchus or lung: Secondary | ICD-10-CM | POA: Diagnosis present

## 2017-10-23 DIAGNOSIS — C3491 Malignant neoplasm of unspecified part of right bronchus or lung: Secondary | ICD-10-CM

## 2017-10-23 NOTE — Progress Notes (Signed)
Radiation Oncology         (336) [phone] ________________________________  Name: Debra Kidd MRN: [id number]  Date: 10/23/2017  DOB: [birth year]  Re-Consultation Note  CC: Debra Roers, MD  Debra Bears, MD    ICD-10-CM   1. Small cell carcinoma of right lung (HCC) C34.91     Diagnosis:  Extensive stage small cell lung cancer (T2a, N3, M1 B), now with enlarging right upper lobe pulmonary nodule  Interval Since Last Radiation:  10 months, 19 days    Radiation treatment dates:   11/19/16-12/02/16  Site/dose:  Brain / 25 Gy in 10 fractions  Chemotherapy treatment: 1) Systemic chemotherapy was carboplatin for AUC of 5 on day 1 and etoposide 100 MG/M2 on days 1, 2 and 3 with Neulasta support. Status post 6 cycles with significant response of her disease.  Narrative:  The patient returns today for routine follow-up and is accompanied by her husband today. Since her last visit to the office, she has been followed by Medical Oncologist, Dr. Julien Nordmann and completed chemotherapy January 2018. Her most recent imaging is as follows: CT CAP with contrast on 08/01/2017 with results of Progressive nodularity along scarring in the apical segment right upper lobe, new nodular component measures 1.1 by 0.7 cm. Although quite possibly inflammatory, malignancy is not readily excluded based on today's exam. Percutaneous biopsy might be difficult given the location. Other options include nuclear medicine PET-CT and surveillance. No findings of recurrence of the paratracheal/hilar component of the mass. Reduced right abdominal mesenteric stranding. Other imaging findings of potential clinical significance: Aortic Atherosclerosis (ICD10-I70.0). Small type 1 hiatal hernia. Old mild compression fractures at T5 and L2 (query osteoporosis).. She then had follow up CT Chest with contrast on 10/06/2017 with results of Enlarging right upper lobe pulmonary lesion consistent with recurrent tumor. Stable to slightly larger  mediastinal and hilar lymph nodes. No evidence of pulmonary metastatic disease or upper abdominal metastatic disease. Aortic Atherosclerosis (ICD10-I70.0).    On review of systems, she reports bilateral hearing issues that initially resolved and returned, mild cough, mild dizziness with standing, and bilateral shoulders with pain due to previous line of work. She reports that she does consume plenty of fluids. She also notes that she is unable to raise her hands up above her head without much pain. She has not followed up with ENT for her hearing issues. She denies head cold, bronchitis, SOB, HA, double vision, bronchitis, and any other symptoms. She is not on O2 at home.  She reports that she lives in [place], [place].                           ALLERGIES:  is allergic to codeine; motrin [ibuprofen]; thiazide-type diuretics; and vicodin [hydrocodone-acetaminophen].  Meds: Current Outpatient Medications  Medication Sig Dispense Refill  . celecoxib (CELEBREX) 200 MG capsule Take 200 mg by mouth daily.    . diphenhydrAMINE (BENADRYL) 25 MG tablet Take 25 mg by mouth every 6 (six) hours as needed.    Marland Kitchen LORazepam (ATIVAN) 1 MG tablet Take 1 mg by mouth 2 (two) times daily.    . traMADol (ULTRAM) 50 MG tablet Take by mouth every 6 (six) hours as needed.    . lidocaine-prilocaine (EMLA) cream Apply 1 application topically as needed. (Patient not taking: Reported on 10/23/2017) 30 g 0   No current facility-administered medications for this encounter.     Physical Findings: The patient is in no acute distress. Patient is  alert and oriented.  height is 5\' 3"  (1.6 m) and weight is 155 lb 12.8 oz (70.7 kg). Her oral temperature is 98.5 F (36.9 C). Her blood pressure is 140/81 and her pulse is 86. Her oxygen saturation is 100%.   Lungs are clear to auscultation bilaterally. Heart has regular rate and rhythm. No palpable cervical, supraclavicular, or axillary adenopathy. Abdomen soft, non-tender, normal  bowel sounds. Cerumen blocking view of the TM bilaterally. Neurological exam is non-focal.   Lab Findings: Lab Results  Component Value Date   WBC 9.1 10/06/2017   HGB 11.8 10/06/2017   HCT 35.6 10/06/2017   MCV 103.5 (H) 10/06/2017   PLT 289 10/06/2017    Radiographic Findings: Ct Chest W Contrast  Result Date: 10/06/2017 CLINICAL DATA:  Small cell lung cancer restaging EXAM: CT CHEST WITH CONTRAST TECHNIQUE: Multidetector CT imaging of the chest was performed during intravenous contrast administration. CONTRAST:  44mL ISOVUE-300 IOPAMIDOL (ISOVUE-300) INJECTION 61% COMPARISON:  Chest CT 08/01/2017 and 04/29/2017 FINDINGS: Cardiovascular: The heart is normal in size. No pericardial effusion. Stable tortuosity and atherosclerotic calcifications involving the thoracic aorta. No dissection or focal aneurysm. Moderate atherosclerotic calcification and estimated 50% stenosis at the origin of the left carotid artery. Stable coronary artery calcifications. Mediastinum/Nodes: 9 mm precarinal lymph node on image number 54 previously measured 8 mm. 10 mm right hilar lymph node on image number 60 previously measured 8 mm. Small subcarinal nodes are stable. 5.5 mm left hilar lymph node on image number 65 is stable. The esophagus is normal. Lungs/Pleura: The right upper lobe nodular density has increased in size since the prior CT scan. It measures 18 x 12 x 15 mm and previously measured 10.5 x 7.0 x 10.5 cm. Findings consistent with recurrent tumor. No new pulmonary lesions or pulmonary nodules to suggest metastatic disease. No acute pulmonary findings or pleural effusion. Upper Abdomen: No significant upper abdominal findings. No hepatic or adrenal gland metastasis are identified. Scattered upper abdominal lymph nodes appears stable. Stable atherosclerotic calcifications involving the aorta. Musculoskeletal: No lytic or sclerotic bone lesions are identified. There is a stable L1 compression deformity. No breast  masses, supraclavicular or axillary adenopathy. The thyroid gland appears normal. The Port-A-Cath is stable. IMPRESSION: 1. Enlarging right upper lobe pulmonary lesion consistent with recurrent tumor. 2. Stable to slightly larger mediastinal and hilar lymph nodes. 3. No evidence of pulmonary metastatic disease or upper abdominal metastatic disease. Aortic Atherosclerosis (ICD10-I70.0). Electronically Signed   By: Marijo Sanes M.D.   On: 10/06/2017 15:04    Impression: Extensive stage small cell lung cancer (T2a, N3, M1 B).  Patient has a solitary enlarging nodule in the right upper lobe consistent with recurrent lung cancer. Based on the location and size of the lesion, she would be a good candidate for SBRT.   We discussed the general course of radiation, potential side effects, and toxicities with radiation with the patient and her husband today. The patient appears to understand and would like to proceed with this course of treatment. A consent form was signed and placed in her chart today.   Plan:  Patient will return on Monday, 10/27/2017 at 1 PM for CT simulation appointment. Pt will likely receive 3 SBRT treatments to the area.  ____________________________________   Blair Promise, PhD, MD    This document serves as a record of services personally performed by Gery Pray, MD. It was created on his behalf by [facility], a trained medical scribe. The creation of this record  is based on the scribe's personal observations and the provider's statements to them. This document has been checked and approved by the attending provider.

## 2017-10-27 ENCOUNTER — Ambulatory Visit
Admission: RE | Admit: 2017-10-27 | Discharge: 2017-10-27 | Disposition: A | Discharge status: Home / Self Care | Payer: Medicare Other | Source: Ambulatory Visit | Attending: Radiation Oncology | Admitting: Radiation Oncology

## 2017-10-27 DIAGNOSIS — C3491 Malignant neoplasm of unspecified part of right bronchus or lung: Secondary | ICD-10-CM

## 2017-10-27 DIAGNOSIS — C3411 Malignant neoplasm of upper lobe, right bronchus or lung: Secondary | ICD-10-CM | POA: Diagnosis not present

## 2017-10-27 DIAGNOSIS — Z510 Encounter for antineoplastic radiation therapy: Secondary | ICD-10-CM | POA: Diagnosis present

## 2017-10-27 NOTE — Progress Notes (Signed)
  Radiation Oncology         (336) [phone] ________________________________  Name: Debra Kidd MRN: [id number]  Date: 10/27/2017  DOB: March 12, 1961   STEREOTACTIC BODY RADIOTHERAPY SIMULATION AND TREATMENT PLANNING NOTE    DIAGNOSIS: Extensive stage small cell lung cancer (T2a, N3, M1 B), now with enlarging right upper lobe pulmonary nodule  NARRATIVE:  The patient was brought to the [facility].  Identity was confirmed.  All relevant records and images related to the planned course of therapy were reviewed.  The patient freely provided informed written consent to proceed with treatment after reviewing the details related to the planned course of therapy. The consent form was witnessed and verified by the simulation staff.  Then, the patient was set-up in a stable reproducible  supine position for radiation therapy.  A BodyFix immobilization pillow was fabricated for reproducible positioning.  Then I personally applied the abdominal compression paddle to limit respiratory excursion.  4D respiratoy motion management CT images were obtained.  Surface markings were placed.  The CT images were loaded into the planning software.  Then, using Cine, MIP, and standard views, the internal target volume (ITV) and planning target volumes (PTV) were delinieated, and avoidance structures were contoured.  Treatment planning then occurred.  The radiation prescription was entered and confirmed.  A total of two complex treatment devices were fabricated in the form of the BodyFix immobilization pillow and a neck accuform cushion.  I have requested : 3D Simulation  I have requested a DVH of the following structures: Heart, Lungs, Esophagus, Chest Wall, Brachial Plexus, Major Blood Vessels, and targets.  PLAN:  The patient will receive 54 Gy in 3 fractions.  -----------------------------------  Blair Promise, PhD, MD This document serves as a record of services personally performed by Gery Pray,  MD. It was created on his behalf by Bethann Humble, a trained medical scribe. The creation of this record is based on the scribe's personal observations and the provider's statements to them. This document has been checked and approved by the attending provider.

## 2017-10-29 DIAGNOSIS — Z510 Encounter for antineoplastic radiation therapy: Secondary | ICD-10-CM | POA: Diagnosis not present

## 2017-11-04 ENCOUNTER — Ambulatory Visit
Admission: RE | Admit: 2017-11-04 | Discharge: 2017-11-04 | Disposition: A | Discharge status: Home / Self Care | Payer: Medicare Other | Source: Ambulatory Visit | Attending: Radiation Oncology | Admitting: Radiation Oncology

## 2017-11-04 DIAGNOSIS — Z510 Encounter for antineoplastic radiation therapy: Secondary | ICD-10-CM | POA: Diagnosis not present

## 2017-11-04 DIAGNOSIS — C3411 Malignant neoplasm of upper lobe, right bronchus or lung: Secondary | ICD-10-CM | POA: Diagnosis not present

## 2017-11-04 DIAGNOSIS — C3491 Malignant neoplasm of unspecified part of right bronchus or lung: Secondary | ICD-10-CM

## 2017-11-04 NOTE — Progress Notes (Signed)
  Radiation Oncology         (336) [phone] ________________________________  Name: Debra Kidd MRN: [id number]  Date: 11/04/2017  DOB: 25-Mar-1961  Stereotactic Body Radiotherapy Treatment Procedure Note  NARRATIVE:  Debra Kidd was brought to the stereotactic radiation treatment machine and placed supine on the CT couch. The patient was set up for stereotactic body radiotherapy on the body fix pillow.  3D TREATMENT PLANNING AND DOSIMETRY:  The patient's radiation plan was reviewed and approved prior to starting treatment.  It showed 3-dimensional radiation distributions overlaid onto the planning CT.  The [facility] for the target structures as well as the organs at risk were reviewed. The documentation of this is filed in the radiation oncology EMR.  SIMULATION VERIFICATION:  The patient underwent CT imaging on the treatment unit.  These were carefully aligned to document that the ablative radiation dose would cover the target volume and maximally spare the nearby organs at risk according to the planned distribution.  SPECIAL TREATMENT PROCEDURE: Debra Kidd received high dose ablative stereotactic body radiotherapy to the planned target volume without unforeseen complications. Treatment was delivered uneventfully. The high doses associated with stereotactic body radiotherapy and the significant potential risks require careful treatment set up and patient monitoring constituting a special treatment procedure   STEREOTACTIC TREATMENT MANAGEMENT:  Following delivery, the patient was evaluated clinically. The patient tolerated treatment without significant acute effects, and was discharged to home in stable condition.    PLAN: Continue treatment as planned.  ________________________________  Blair Promise, PhD, MD

## 2017-11-05 ENCOUNTER — Ambulatory Visit: Payer: Medicare Other | Admitting: Radiation Oncology

## 2017-11-06 ENCOUNTER — Ambulatory Visit
Admission: RE | Admit: 2017-11-06 | Discharge: 2017-11-06 | Disposition: A | Discharge status: Home / Self Care | Payer: Medicare Other | Source: Ambulatory Visit | Attending: Radiation Oncology | Admitting: Radiation Oncology

## 2017-11-06 DIAGNOSIS — Z510 Encounter for antineoplastic radiation therapy: Secondary | ICD-10-CM | POA: Diagnosis not present

## 2017-11-06 DIAGNOSIS — C3491 Malignant neoplasm of unspecified part of right bronchus or lung: Secondary | ICD-10-CM

## 2017-11-06 NOTE — Progress Notes (Signed)
  Radiation Oncology         [phone]) [phone] ________________________________  Name: Debra Kidd MRN: [id number]  Date: 11/06/2017  DOB: 1961/02/28  Stereotactic Body Radiotherapy Treatment Procedure Note  NARRATIVE:  Debra Kidd was brought to the stereotactic radiation treatment machine and placed supine on the CT couch. The patient was set up for stereotactic body radiotherapy on the body fix pillow.  3D TREATMENT PLANNING AND DOSIMETRY:  The patient's radiation plan was reviewed and approved prior to starting treatment.  It showed 3-dimensional radiation distributions overlaid onto the planning CT.  The [facility] for the target structures as well as the organs at risk were reviewed. The documentation of this is filed in the radiation oncology EMR.  SIMULATION VERIFICATION:  The patient underwent CT imaging on the treatment unit.  These were carefully aligned to document that the ablative radiation dose would cover the target volume and maximally spare the nearby organs at risk according to the planned distribution.  SPECIAL TREATMENT PROCEDURE: Debra Kidd received high dose ablative stereotactic body radiotherapy to the planned target volume without unforeseen complications. Treatment was delivered uneventfully. The high doses associated with stereotactic body radiotherapy and the significant potential risks require careful treatment set up and patient monitoring constituting a special treatment procedure   STEREOTACTIC TREATMENT MANAGEMENT:  Following delivery, the patient was evaluated clinically. The patient tolerated treatment without significant acute effects, and was discharged to home in stable condition.    PLAN: Continue treatment as planned.  ________________________________  Blair Promise, PhD, MD

## 2017-11-07 ENCOUNTER — Ambulatory Visit: Payer: Medicare Other | Admitting: Radiation Oncology

## 2017-11-10 ENCOUNTER — Ambulatory Visit
Admission: RE | Admit: 2017-11-10 | Discharge: 2017-11-10 | Disposition: A | Discharge status: Home / Self Care | Payer: Medicare Other | Source: Ambulatory Visit | Attending: Radiation Oncology | Admitting: Radiation Oncology

## 2017-11-10 ENCOUNTER — Encounter: Payer: Self-pay | Admitting: Radiation Oncology

## 2017-11-10 DIAGNOSIS — C3491 Malignant neoplasm of unspecified part of right bronchus or lung: Secondary | ICD-10-CM

## 2017-11-10 DIAGNOSIS — Z510 Encounter for antineoplastic radiation therapy: Secondary | ICD-10-CM | POA: Diagnosis not present

## 2017-11-10 NOTE — Progress Notes (Signed)
  Radiation Oncology         [phone] ________________________________  Name: Ysidra Sopher MRN: [id number]  Date: 11/10/2017  DOB: 1960/12/24  Stereotactic Body Radiotherapy Treatment Procedure Note  NARRATIVE:  Jaime Dome was brought to the stereotactic radiation treatment machine and placed supine on the CT couch. The patient was set up for stereotactic body radiotherapy on the body fix pillow.  3D TREATMENT PLANNING AND DOSIMETRY:  The patient's radiation plan was reviewed and approved prior to starting treatment.  It showed 3-dimensional radiation distributions overlaid onto the planning CT.  The [facility] for the target structures as well as the organs at risk were reviewed. The documentation of this is filed in the radiation oncology EMR.  SIMULATION VERIFICATION:  The patient underwent CT imaging on the treatment unit.  These were carefully aligned to document that the ablative radiation dose would cover the target volume and maximally spare the nearby organs at risk according to the planned distribution.  SPECIAL TREATMENT PROCEDURE: Marin Wisner received high dose ablative stereotactic body radiotherapy to the planned target volume without unforeseen complications. Treatment was delivered uneventfully. The high doses associated with stereotactic body radiotherapy and the significant potential risks require careful treatment set up and patient monitoring constituting a special treatment procedure   STEREOTACTIC TREATMENT MANAGEMENT:  Following delivery, the patient was evaluated clinically. The patient tolerated treatment without significant acute effects, and was discharged to home in stable condition.    PLAN: Continue treatment as planned.  ________________________________  Blair Promise, PhD, MD

## 2017-11-10 NOTE — Progress Notes (Signed)
  Radiation Oncology         [phone]) [phone] ________________________________  Name: Debra Kidd MRN: [id number]  Date: 11/10/2017  DOB: 10/30/1960  End of Treatment Note  Diagnosis: Extensive stage small cell lung cancer (T2a, N3, M1 B), now with enlarging right upper lobe pulmonary nodule, oligo metastatic disease  Indication for treatment: Curative     Radiation treatment dates: 11/04/17-11/10/17  Site/dose: Right lung/ 54 Gy in 3 fractions  Beams/energy:   SBRT SRT-VMAT/ 6X  Narrative: The patient tolerated radiation treatment relatively well. During treatment the patient complained of a dry cough. She also noted mild fatigue. She denied pain or shortness of breath.  Plan: The patient has completed radiation treatment. The patient will return to radiation oncology clinic for routine followup in one month. I advised them to call or return sooner if they have any questions or concerns related to their recovery or treatment.  -----------------------------------  Blair Promise, PhD, MD  This document serves as a record of services personally performed by Gery Pray, MD. It was created on his behalf by Bethann Humble, a trained medical scribe. The creation of this record is based on the scribe's personal observations and the provider's statements to them. This document has been checked and approved by the attending provider.

## 2017-11-12 ENCOUNTER — Ambulatory Visit: Payer: Medicare Other | Admitting: Radiation Oncology

## 2017-11-14 ENCOUNTER — Ambulatory Visit: Payer: Medicare Other | Admitting: Radiation Oncology

## 2017-11-27 ENCOUNTER — Telehealth: Payer: Self-pay | Admitting: *Deleted

## 2017-11-27 NOTE — Telephone Encounter (Signed)
Returned call to pt who states " I feel so much better. I am a little faint and sweating some. I don't have a lot of energy and I have some heartburn." Denied fever chills, chest pain, has no appetite. Offered to bring pt in to [facility] to be further evaluated. Pt denied states she is okay. Pt denied any futher concerns, discussed and encouraged small meals, hydration. Pt verbalized understanding.

## 2017-12-15 ENCOUNTER — Other Ambulatory Visit: Payer: Self-pay

## 2017-12-15 ENCOUNTER — Encounter: Payer: Self-pay | Admitting: Radiation Oncology

## 2017-12-15 ENCOUNTER — Ambulatory Visit
Admission: RE | Admit: 2017-12-15 | Discharge: 2017-12-15 | Disposition: A | Discharge status: Home / Self Care | Payer: Medicare Other | Source: Ambulatory Visit | Attending: Radiation Oncology | Admitting: Radiation Oncology

## 2017-12-15 VITALS — BP 141/80 | HR 85 | Temp 98.6°F | Resp 20 | Wt 158.4 lb

## 2017-12-15 DIAGNOSIS — C3491 Malignant neoplasm of unspecified part of right bronchus or lung: Secondary | ICD-10-CM

## 2017-12-15 DIAGNOSIS — Z923 Personal history of irradiation: Secondary | ICD-10-CM | POA: Insufficient documentation

## 2017-12-15 DIAGNOSIS — Z85118 Personal history of other malignant neoplasm of bronchus and lung: Secondary | ICD-10-CM | POA: Diagnosis present

## 2017-12-15 DIAGNOSIS — Z79899 Other long term (current) drug therapy: Secondary | ICD-10-CM | POA: Diagnosis not present

## 2017-12-15 NOTE — Progress Notes (Signed)
  Radiation Oncology         [phone] ________________________________  Name: Debra Kidd MRN: [id number]  Date: 12/15/2017  DOB: 1960-09-09  Follow-Up Visit Note  CC: [name], MD  [name], MD    ICD-10-CM   1. Small cell carcinoma of right lung (HCC) C34.91     Diagnosis:  Extensive stage small cell lung cancer (T2a, N3, M1 B), now with enlarging right upper lobe pulmonary nodule, oligo metastatic disease  Interval Since Last Radiation: 1 month 11/04/17-11/10/17: 54 Gy to the right lung in 3 fractions  Narrative:  The patient returns today for routine follow-up. She denies pain at this time. She reports mild fatigue. She also complains of shortness of breath with activity. She denies difficulty swallowing, coughing, or skin issues. She notes a good appetite.                              ALLERGIES:  is allergic to codeine; motrin [ibuprofen]; thiazide-type diuretics; and vicodin [hydrocodone-acetaminophen].  Meds: Current Outpatient Medications  Medication Sig Dispense Refill  . celecoxib (CELEBREX) 200 MG capsule Take 200 mg by mouth daily.    Marland Kitchen LORazepam (ATIVAN) 1 MG tablet Take 1 mg by mouth 2 (two) times daily.    . traMADol (ULTRAM) 50 MG tablet Take by mouth every 6 (six) hours as needed.    . diphenhydrAMINE (BENADRYL) 25 MG tablet Take 25 mg by mouth every 6 (six) hours as needed.    . lidocaine-prilocaine (EMLA) cream Apply 1 application topically as needed. (Patient not taking: Reported on 10/23/2017) 30 g 0   No current facility-administered medications for this encounter.     Physical Findings: The patient is in no acute distress. Patient is alert and oriented.  weight is 158 lb 6 oz (71.8 kg). Her oral temperature is 98.6 F (37 C). Her blood pressure is 141/80 (abnormal) and her pulse is 85. Her respiration is 20 and oxygen saturation is 100%. .  No significant changes. Lungs are clear to auscultation bilaterally. Heart has regular rate and  rhythm. No palpable cervical, supraclavicular, or axillary adenopathy. Abdomen soft, non-tender, normal bowel sounds.   Lab Findings: Lab Results  Component Value Date   WBC 9.1 10/06/2017   HGB 11.8 10/06/2017   HCT 35.6 10/06/2017   MCV 103.5 (H) 10/06/2017   PLT 289 10/06/2017    Radiographic Findings: No results found.  Impression:  The patient is recovering from the effects of radiation. No evidence of recurrence on clinical exam.  Plan: Follow-up in radiation oncology prn. The patient will undergo CT of the chest, abdomen, and pelvis on 01/09/18 and follow-up with Dr. Julien Nordmann on 01/12/18. The patient will remain under close follow-up with Dr. Julien Nordmann.  ____________________________________  This document serves as a record of services personally performed by Gery Pray, MD. It was created on his behalf by Bethann Humble, a trained medical scribe. The creation of this record is based on the scribe's personal observations and the provider's statements to them. This document has been checked and approved by the attending provider.

## 2017-12-15 NOTE — Progress Notes (Addendum)
Debra Kidd is here for a follow-up appointment. Denies any pain .States that she has mild fatigue. States that she gets shortness of breath with activity. Denies any difficulty with swallowing. States that her appetite is good. Denies any coughing. Denies any skin issues .Patient has a follow-up appointment with Dr. Julien Nordmann on 01-12-2018. Vitals:   12/15/17 1116  BP: (!) 141/80  Pulse: 85  Resp: 20  Temp: 98.6 F (37 C)  TempSrc: Oral  SpO2: 100%  Weight: 158 lb 6 oz (71.8 kg)   Wt Readings from Last 3 Encounters:  12/15/17 158 lb 6 oz (71.8 kg)  10/23/17 155 lb 12.8 oz (70.7 kg)  10/13/17 155 lb (70.3 kg)

## 2018-01-09 ENCOUNTER — Ambulatory Visit (HOSPITAL_COMMUNITY)
Admission: RE | Admit: 2018-01-09 | Discharge: 2018-01-09 | Disposition: A | Discharge status: Home / Self Care | Payer: Medicare Other | Source: Ambulatory Visit | Attending: Internal Medicine | Admitting: Internal Medicine

## 2018-01-09 ENCOUNTER — Other Ambulatory Visit: Payer: Medicare Other

## 2018-01-09 ENCOUNTER — Inpatient Hospital Stay: Payer: Medicare Other | Attending: Internal Medicine

## 2018-01-09 ENCOUNTER — Encounter (HOSPITAL_COMMUNITY): Payer: Self-pay

## 2018-01-09 DIAGNOSIS — F17210 Nicotine dependence, cigarettes, uncomplicated: Secondary | ICD-10-CM | POA: Diagnosis not present

## 2018-01-09 DIAGNOSIS — G8929 Other chronic pain: Secondary | ICD-10-CM | POA: Diagnosis not present

## 2018-01-09 DIAGNOSIS — M810 Age-related osteoporosis without current pathological fracture: Secondary | ICD-10-CM | POA: Diagnosis not present

## 2018-01-09 DIAGNOSIS — C3491 Malignant neoplasm of unspecified part of right bronchus or lung: Secondary | ICD-10-CM | POA: Diagnosis not present

## 2018-01-09 DIAGNOSIS — Z79899 Other long term (current) drug therapy: Secondary | ICD-10-CM | POA: Insufficient documentation

## 2018-01-09 DIAGNOSIS — C3411 Malignant neoplasm of upper lobe, right bronchus or lung: Secondary | ICD-10-CM | POA: Diagnosis present

## 2018-01-09 DIAGNOSIS — Z923 Personal history of irradiation: Secondary | ICD-10-CM | POA: Diagnosis not present

## 2018-01-09 DIAGNOSIS — I10 Essential (primary) hypertension: Secondary | ICD-10-CM | POA: Insufficient documentation

## 2018-01-09 DIAGNOSIS — E785 Hyperlipidemia, unspecified: Secondary | ICD-10-CM | POA: Diagnosis not present

## 2018-01-09 DIAGNOSIS — I700 Atherosclerosis of aorta: Secondary | ICD-10-CM | POA: Diagnosis not present

## 2018-01-09 DIAGNOSIS — C7889 Secondary malignant neoplasm of other digestive organs: Secondary | ICD-10-CM | POA: Diagnosis not present

## 2018-01-09 DIAGNOSIS — Z452 Encounter for adjustment and management of vascular access device: Secondary | ICD-10-CM | POA: Insufficient documentation

## 2018-01-09 DIAGNOSIS — Z90710 Acquired absence of both cervix and uterus: Secondary | ICD-10-CM | POA: Diagnosis not present

## 2018-01-09 DIAGNOSIS — C778 Secondary and unspecified malignant neoplasm of lymph nodes of multiple regions: Secondary | ICD-10-CM | POA: Diagnosis not present

## 2018-01-09 DIAGNOSIS — F329 Major depressive disorder, single episode, unspecified: Secondary | ICD-10-CM | POA: Insufficient documentation

## 2018-01-09 DIAGNOSIS — Z85828 Personal history of other malignant neoplasm of skin: Secondary | ICD-10-CM | POA: Diagnosis not present

## 2018-01-09 DIAGNOSIS — Z9221 Personal history of antineoplastic chemotherapy: Secondary | ICD-10-CM | POA: Insufficient documentation

## 2018-01-09 LAB — CMP (CANCER CENTER ONLY)
ALBUMIN: 3.5 g/dL (ref 3.5–5.0)
ALK PHOS: 115 U/L (ref 40–150)
ALT: 18 U/L (ref 0–55)
AST: 16 U/L (ref 5–34)
Anion gap: 8 (ref 3–11)
BILIRUBIN TOTAL: 0.3 mg/dL (ref 0.2–1.2)
BUN: 11 mg/dL (ref 7–26)
CO2: 27 mmol/L (ref 22–29)
Calcium: 9.1 mg/dL (ref 8.4–10.4)
Chloride: 102 mmol/L (ref 98–109)
Creatinine: 1 mg/dL (ref 0.60–1.10)
GFR, Est AFR Am: >60 mL/min (ref >60)
GFR, Estimated: >60 mL/min (ref >60)
GLUCOSE: 98 mg/dL (ref 70–140)
POTASSIUM: 3.9 mmol/L (ref 3.5–5.1)
Sodium: 137 mmol/L (ref 136–145)
TOTAL PROTEIN: 7.2 g/dL (ref 6.4–8.3)

## 2018-01-09 LAB — CBC WITH DIFFERENTIAL (CANCER CENTER ONLY)
BASOS ABS: 0 10*3/uL (ref 0.0–0.1)
BASOS PCT: 1 %
EOS ABS: 0.1 10*3/uL (ref 0.0–0.5)
Eosinophils Relative: 2 %
HEMATOCRIT: 35.7 % (ref 34.8–46.6)
HEMOGLOBIN: 12.2 g/dL (ref 11.6–15.9)
Lymphocytes Relative: 17 %
Lymphs Abs: 1.1 10*3/uL (ref 0.9–3.3)
MCH: 33.8 pg (ref 25.1–34.0)
MCHC: 34.2 g/dL (ref 31.5–36.0)
MCV: 98.9 fL (ref 79.5–101.0)
Monocytes Absolute: 0.5 10*3/uL (ref 0.1–0.9)
Monocytes Relative: 7 %
NEUTROS ABS: 4.8 10*3/uL (ref 1.5–6.5)
Neutrophils Relative %: 73 %
Platelet Count: 243 10*3/uL (ref 145–400)
RBC: 3.61 MIL/uL — ABNORMAL LOW (ref 3.70–5.45)
RDW: 13.3 % (ref 11.2–14.5)
WBC Count: 6.5 10*3/uL (ref 3.9–10.3)

## 2018-01-09 MED ORDER — IOHEXOL 300 MG/ML  SOLN
100.0000 mL | Freq: Once | INTRAMUSCULAR | Status: AC | PRN
  Administered 2018-01-09: 100 mL via INTRAVENOUS

## 2018-01-12 ENCOUNTER — Inpatient Hospital Stay (HOSPITAL_BASED_OUTPATIENT_CLINIC_OR_DEPARTMENT_OTHER): Payer: Medicare Other | Admitting: Internal Medicine

## 2018-01-12 ENCOUNTER — Inpatient Hospital Stay: Payer: Medicare Other

## 2018-01-12 ENCOUNTER — Encounter: Payer: Self-pay | Admitting: Internal Medicine

## 2018-01-12 ENCOUNTER — Telehealth: Payer: Self-pay | Admitting: Internal Medicine

## 2018-01-12 VITALS — BP 148/95 | HR 107 | Temp 98.5°F | Resp 20 | Ht 63.0 in | Wt 157.2 lb

## 2018-01-12 DIAGNOSIS — Z923 Personal history of irradiation: Secondary | ICD-10-CM

## 2018-01-12 DIAGNOSIS — C9002 Multiple myeloma in relapse: Secondary | ICD-10-CM

## 2018-01-12 DIAGNOSIS — G8929 Other chronic pain: Secondary | ICD-10-CM

## 2018-01-12 DIAGNOSIS — I10 Essential (primary) hypertension: Secondary | ICD-10-CM

## 2018-01-12 DIAGNOSIS — C778 Secondary and unspecified malignant neoplasm of lymph nodes of multiple regions: Secondary | ICD-10-CM

## 2018-01-12 DIAGNOSIS — Z9221 Personal history of antineoplastic chemotherapy: Secondary | ICD-10-CM | POA: Diagnosis not present

## 2018-01-12 DIAGNOSIS — F17210 Nicotine dependence, cigarettes, uncomplicated: Secondary | ICD-10-CM

## 2018-01-12 DIAGNOSIS — C7889 Secondary malignant neoplasm of other digestive organs: Secondary | ICD-10-CM | POA: Diagnosis not present

## 2018-01-12 DIAGNOSIS — C3411 Malignant neoplasm of upper lobe, right bronchus or lung: Secondary | ICD-10-CM

## 2018-01-12 DIAGNOSIS — Z85828 Personal history of other malignant neoplasm of skin: Secondary | ICD-10-CM

## 2018-01-12 DIAGNOSIS — M810 Age-related osteoporosis without current pathological fracture: Secondary | ICD-10-CM | POA: Diagnosis not present

## 2018-01-12 DIAGNOSIS — E785 Hyperlipidemia, unspecified: Secondary | ICD-10-CM | POA: Diagnosis not present

## 2018-01-12 DIAGNOSIS — I700 Atherosclerosis of aorta: Secondary | ICD-10-CM | POA: Diagnosis not present

## 2018-01-12 DIAGNOSIS — F329 Major depressive disorder, single episode, unspecified: Secondary | ICD-10-CM | POA: Diagnosis not present

## 2018-01-12 DIAGNOSIS — C3491 Malignant neoplasm of unspecified part of right bronchus or lung: Secondary | ICD-10-CM

## 2018-01-12 DIAGNOSIS — Z79899 Other long term (current) drug therapy: Secondary | ICD-10-CM

## 2018-01-12 DIAGNOSIS — Z90710 Acquired absence of both cervix and uterus: Secondary | ICD-10-CM

## 2018-01-12 MED ORDER — HEPARIN SOD (PORK) LOCK FLUSH 100 UNIT/ML IV SOLN
500.0000 [IU] | Freq: Once | INTRAVENOUS | Status: AC
  Administered 2018-01-12: 500 [IU] via INTRAVENOUS
  Filled 2018-01-12: qty 5

## 2018-01-12 MED ORDER — LIDOCAINE-PRILOCAINE 2.5-2.5 % EX CREA
TOPICAL_CREAM | CUTANEOUS | Status: AC
  Filled 2018-01-12: qty 5

## 2018-01-12 MED ORDER — SODIUM CHLORIDE 0.9% FLUSH
10.0000 mL | Freq: Once | INTRAVENOUS | Status: AC
  Administered 2018-01-12: 10 mL via INTRAVENOUS
  Filled 2018-01-12: qty 10

## 2018-01-12 NOTE — Patient Instructions (Signed)
Steps to Quit Smoking Smoking tobacco can be bad for your health. It can also affect almost every organ in your body. Smoking puts you and people around you at risk for many serious long-lasting (chronic) diseases. Quitting smoking is hard, but it is one of the best things that you can do for your health. It is never too late to quit. What are the benefits of quitting smoking? When you quit smoking, you lower your risk for getting serious diseases and conditions. They can include:  Lung cancer or lung disease.  Heart disease.  Stroke.  Heart attack.  Not being able to have children (infertility).  Weak bones (osteoporosis) and broken bones (fractures).  If you have coughing, wheezing, and shortness of breath, those symptoms may get better when you quit. You may also get sick less often. If you are pregnant, quitting smoking can help to lower your chances of having a baby of low birth weight. What can I do to help me quit smoking? Talk with your doctor about what can help you quit smoking. Some things you can do (strategies) include:  Quitting smoking totally, instead of slowly cutting back how much you smoke over a period of time.  Going to in-person counseling. You are more likely to quit if you go to many counseling sessions.  Using resources and support systems, such as: ? Online chats with a counselor. ? Phone quitlines. ? Printed self-help materials. ? Support groups or group counseling. ? Text messaging programs. ? Mobile phone apps or applications.  Taking medicines. Some of these medicines may have nicotine in them. If you are pregnant or breastfeeding, do not take any medicines to quit smoking unless your doctor says it is okay. Talk with your doctor about counseling or other things that can help you.  Talk with your doctor about using more than one strategy at the same time, such as taking medicines while you are also going to in-person counseling. This can help make  quitting easier. What things can I do to make it easier to quit? Quitting smoking might feel very hard at first, but there is a lot that you can do to make it easier. Take these steps:  Talk to your family and friends. Ask them to support and encourage you.  Call phone quitlines, reach out to support groups, or work with a counselor.  Ask people who smoke to not smoke around you.  Avoid places that make you want (trigger) to smoke, such as: ? Bars. ? Parties. ? Smoke-break areas at work.  Spend time with people who do not smoke.  Lower the stress in your life. Stress can make you want to smoke. Try these things to help your stress: ? Getting regular exercise. ? Deep-breathing exercises. ? Yoga. ? Meditating. ? Doing a body scan. To do this, close your eyes, focus on one area of your body at a time from head to toe, and notice which parts of your body are tense. Try to relax the muscles in those areas.  Download or buy apps on your mobile phone or tablet that can help you stick to your quit plan. There are many free apps, such as QuitGuide from the CDC (Centers for Disease Control and Prevention). You can find more support from smokefree.gov and other websites.  This information is not intended to replace advice given to you by your health care provider. Make sure you discuss any questions you have with your health care provider. Document Released: 06/15/2009 Document   Revised: 04/16/2016 Document Reviewed: 01/03/2015 Elsevier Interactive Patient Education  2018 Elsevier Inc.  

## 2018-01-12 NOTE — Telephone Encounter (Signed)
Gave patient avs report and appointments for July and September. Next flush appointment coordinated with 2 month lab/ct.

## 2018-01-12 NOTE — Progress Notes (Signed)
[facility] Telephone:([phone]   [phone]  OFFICE PROGRESS NOTE  [name], MD [address] [place] [address]  DIAGNOSIS: Extensive stage (T2a, N3, [name], large right anterior mediastinal and supraclavicular lymphadenopathy as well as pancreatic and splenic metastasis diagnosed in September 2017.  PRIOR THERAPY:  1) Systemic chemotherapy was carboplatin for AUC of 5 on day 1 and etoposide 100 MG/M2 on days 1, 2 and 3 with Neulasta support. Status post 6 cycles with significant response of her disease. 2) Prophylactic cranial irradiation under the care of Dr. Sondra Come on 12/02/2016. 3) stereotactic radiotherapy to the recurrent right upper lobe pulmonary nodule under the care of Dr. Sondra Come completed November 10, 2017.  CURRENT THERAPY: Observation.  INTERVAL HISTORY: Debra Kidd 57 y.o. female returns to the clinic today for follow-up visit accompanied by her husband.  The patient is feeling fine with no specific complaints.  She denied having any chest pain, shortness breath, cough or hemoptysis.  She denied having any fever or chills.  She has no nausea, vomiting, diarrhea or constipation.  Unfortunately she continues to smoke 1 pack/day.  She underwent radiotherapy under the care of Dr. Sondra Come and she is tolerating it fairly well.  She had repeat CT scan of the chest, abdomen and pelvis performed recently and she is here for evaluation and discussion of her risk her results.   MEDICAL HISTORY: Past Medical History:  Diagnosis Date  . 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 Blood type, Rh positive   . Cancer ([place])   . Chronic pain syndrome   . Depression 08/07/2016  . Encounter for antineoplastic chemotherapy 07/17/2016  . History of external beam radiation therapy 11/19/16-12/02/16   brain 25 Gy in 10 fractions  . Hyperlipidemia   . Hypertension   . Hypertension  08/07/2016  . Osteoporosis     ALLERGIES:  is allergic to codeine; motrin [ibuprofen]; thiazide-type diuretics; and vicodin [hydrocodone-acetaminophen].  MEDICATIONS:  Current Outpatient Medications  Medication Sig Dispense Refill  . celecoxib (CELEBREX) 200 MG capsule Take 200 mg by mouth daily.    . diphenhydrAMINE (BENADRYL) 25 MG tablet Take 25 mg by mouth every 6 (six) hours as needed.    . lidocaine-prilocaine (EMLA) cream Apply 1 application topically as needed. 30 g 0  . LORazepam (ATIVAN) 1 MG tablet Take 1 mg by mouth 2 (two) times daily.    . traMADol (ULTRAM) 50 MG tablet Take by mouth every 6 (six) hours as needed.     No current facility-administered medications for this visit.     SURGICAL HISTORY:  Past Surgical History:  Procedure Laterality Date  . ABDOMINAL HYSTERECTOMY  1981  . APPENDECTOMY     at age 45  . EYE SURGERY     left  . IR GENERIC HISTORICAL  06/03/2016   IR FLUORO GUIDE PORT INSERTION RIGHT 06/03/2016 [facility]INTER[facility]  . IR GENERIC HISTORICAL  06/03/2016   IR US GUIDE VASC ACCESS RIGHT 06/03/2016 [facility]  . SKIN CANCER EXCISION     basal cell carcinoma L side of face    REVIEW OF SYSTEMS:  Constitutional: negative Eyes: negative Ears, nose, mouth, throat, and face: negative Respiratory: negative Cardiovascular: negative Gastrointestinal: negative Genitourinary:negative Integument/breast: negative Hematologic/lymphatic: negative Musculoskeletal:positive for arthralgias Neurological: negative Behavioral/Psych: negative Endocrine: negative Allergic/Immunologic: negative   PHYSICAL EXAMINATION: General appearance: alert,  cooperative and no distress Head: Normocephalic, without obvious abnormality, atraumatic Neck: no adenopathy, no JVD, supple, symmetrical, trachea midline and thyroid not enlarged, symmetric, no tenderness/mass/nodules Lymph nodes: Cervical, supraclavicular, and axillary nodes normal. Resp: clear to auscultation  bilaterally Back: symmetric, no curvature. ROM normal. No CVA tenderness. Cardio: regular rate and rhythm, S1, S2 normal, no murmur, click, rub or gallop GI: soft, non-tender; bowel sounds normal; no masses,  no organomegaly Extremities: extremities normal, atraumatic, no cyanosis or edema Neurologic: Alert and oriented X 3, normal strength and tone. Normal symmetric reflexes. Normal coordination and gait  ECOG PERFORMANCE STATUS: 1 - Symptomatic but completely ambulatory  Blood pressure (!) 148/95, pulse (!) 107, temperature 98.5 F (36.9 C), temperature source Oral, resp. rate 20, height 5\' 3"  (1.6 m), weight 157 lb 3.2 oz (71.3 kg), SpO2 99 %.  LABORATORY DATA: Lab Results  Component Value Date   WBC 6.5 01/09/2018   HGB 12.2 01/09/2018   HCT 35.7 01/09/2018   MCV 98.9 01/09/2018   PLT 243 01/09/2018      Chemistry      Component Value Date/Time   NA 137 01/09/2018 0738   NA 141 08/01/2017 0835   K 3.9 01/09/2018 0738   K 3.5 08/01/2017 0835   CL 102 01/09/2018 0738   CO2 27 01/09/2018 0738   CO2 25 08/01/2017 0835   BUN 11 01/09/2018 0738   BUN 8.0 08/01/2017 0835   CREATININE 1.00 01/09/2018 0738   CREATININE 0.9 08/01/2017 0835      Component Value Date/Time   CALCIUM 9.1 01/09/2018 0738   CALCIUM 9.9 08/01/2017 0835   ALKPHOS 115 01/09/2018 0738   ALKPHOS 142 08/01/2017 0835   AST 16 01/09/2018 0738   AST 15 08/01/2017 0835   ALT 18 01/09/2018 0738   ALT 19 08/01/2017 0835   BILITOT 0.3 01/09/2018 0738   BILITOT 0.35 08/01/2017 0835       RADIOGRAPHIC STUDIES: Ct Chest W Contrast  Result Date: 01/09/2018 CLINICAL DATA:  Small-cell lung cancer with enlarging right upper lobe pulmonary nodule. EXAM: CT CHEST, ABDOMEN, AND PELVIS WITH CONTRAST TECHNIQUE: Multidetector CT imaging of the chest, abdomen and pelvis was performed following the standard protocol during bolus administration of intravenous contrast. CONTRAST:  143mL OMNIPAQUE IOHEXOL 300 MG/ML  SOLN  COMPARISON:  Chest CT 10/06/2017.  Abdomen and pelvis CT 08/01/2017. FINDINGS: CT CHEST FINDINGS Cardiovascular: The heart size is normal. No pericardial effusion. Coronary artery calcification is evident. Atherosclerotic calcification is noted in the wall of the thoracic aorta. Right Port-A-Cath tip is positioned at the SVC/RA junction. Mediastinum/Nodes: No mediastinal lymphadenopathy. There is no hilar lymphadenopathy. The esophagus has normal imaging features. There is no axillary lymphadenopathy. Lungs/Pleura: The central tracheobronchial airways are patent. Right upper lobe pulmonary nodule has decreased in the interval, measuring 0.5 x 0.7 cm today compared to 1.8 x 1.2 cm previously. Bandlike areas of mild interstitial and airspace opacity in the right upper lobe are compatible with changes related to radiation therapy. No new pulmonary nodule or mass. Subsegmental atelectasis noted in the lung bases. Musculoskeletal: Bone windows reveal no worrisome lytic or sclerotic osseous lesions. CT ABDOMEN PELVIS FINDINGS Hepatobiliary: No focal abnormality within the liver parenchyma. There is no evidence for gallstones, gallbladder wall thickening, or pericholecystic fluid. No intrahepatic or extrahepatic biliary dilation. Pancreas: No focal mass lesion. No dilatation of the main duct. No intraparenchymal cyst. No peripancreatic edema. Spleen: No splenomegaly. No focal mass lesion. Adrenals/Urinary Tract: No adrenal nodule or mass. Kidneys  unremarkable. No evidence for hydroureter. The urinary bladder appears normal for the degree of distention. Stomach/Bowel: Tiny hiatal hernia. Stomach otherwise unremarkable. Duodenum is normally positioned as is the ligament of Treitz. No small bowel wall thickening. No small bowel dilatation. The terminal ileum is normal. The appendix is not visualized, but there is no edema or inflammation in the region of the cecum. No gross colonic mass. No colonic wall thickening. No  substantial diverticular change. Vascular/Lymphatic: There is abdominal aortic atherosclerosis without aneurysm. There is no gastrohepatic or hepatoduodenal ligament lymphadenopathy. No intraperitoneal or retroperitoneal lymphadenopathy. No pelvic sidewall lymphadenopathy. Reproductive: Uterus surgically absent.  There is no adnexal mass. Other: 1.5 x 1.9 cm nodule in the right abdominal mesentery (image 71/series 2) is new in the interval. 2.6 cm area of hazy focal attenuation in the small bowel mesentery is identified nearby (image 75/2). Similar haziness is seen in the left para-aortic space on image 74/2 and there is ill-defined soft tissue attenuation in the sigmoid mesocolon (see image 92/series 2). Infiltrative soft tissue stranding is noted in the extraperitoneal right pelvic sidewall with some apparent mass-effect on the right external iliac vein. Musculoskeletal: Bone windows reveal no worrisome lytic or sclerotic osseous lesions. Stable appearance L2 compression deformity. IMPRESSION: 1. Marked interval decrease in size of the right upper lobe pulmonary nodule with adjacent post radiation change. 2. Interval development of a 1.9 cm right mesenteric nodule. Metastatic disease not excluded and close attention recommended. 3. Interval development of several areas ill-defined soft tissue density and focal, but non confluent stranding in the mesentery and right pelvic sidewall. Comparing back to an older study from 04/29/2017, the patient did have a single area of focal non confluent soft tissue stranding at that time (that is now resolved). Areas of edema/infection/inflammation could generate this appearance. Mesenteric hemorrhage would also be a consideration. This would not be a typical appearance for metastatic disease. 4.  Aortic Atherosclerois (ICD10-170.0) Electronically Signed   By: Misty Stanley M.D.   On: 01/09/2018 17:57   Ct Abdomen Pelvis W Contrast  Result Date: 01/09/2018 CLINICAL DATA:   Small-cell lung cancer with enlarging right upper lobe pulmonary nodule. EXAM: CT CHEST, ABDOMEN, AND PELVIS WITH CONTRAST TECHNIQUE: Multidetector CT imaging of the chest, abdomen and pelvis was performed following the standard protocol during bolus administration of intravenous contrast. CONTRAST:  159mL OMNIPAQUE IOHEXOL 300 MG/ML  SOLN COMPARISON:  Chest CT 10/06/2017.  Abdomen and pelvis CT 08/01/2017. FINDINGS: CT CHEST FINDINGS Cardiovascular: The heart size is normal. No pericardial effusion. Coronary artery calcification is evident. Atherosclerotic calcification is noted in the wall of the thoracic aorta. Right Port-A-Cath tip is positioned at the SVC/RA junction. Mediastinum/Nodes: No mediastinal lymphadenopathy. There is no hilar lymphadenopathy. The esophagus has normal imaging features. There is no axillary lymphadenopathy. Lungs/Pleura: The central tracheobronchial airways are patent. Right upper lobe pulmonary nodule has decreased in the interval, measuring 0.5 x 0.7 cm today compared to 1.8 x 1.2 cm previously. Bandlike areas of mild interstitial and airspace opacity in the right upper lobe are compatible with changes related to radiation therapy. No new pulmonary nodule or mass. Subsegmental atelectasis noted in the lung bases. Musculoskeletal: Bone windows reveal no worrisome lytic or sclerotic osseous lesions. CT ABDOMEN PELVIS FINDINGS Hepatobiliary: No focal abnormality within the liver parenchyma. There is no evidence for gallstones, gallbladder wall thickening, or pericholecystic fluid. No intrahepatic or extrahepatic biliary dilation. Pancreas: No focal mass lesion. No dilatation of the main duct. No intraparenchymal cyst. No peripancreatic edema.  Spleen: No splenomegaly. No focal mass lesion. Adrenals/Urinary Tract: No adrenal nodule or mass. Kidneys unremarkable. No evidence for hydroureter. The urinary bladder appears normal for the degree of distention. Stomach/Bowel: Tiny hiatal hernia.  Stomach otherwise unremarkable. Duodenum is normally positioned as is the ligament of Treitz. No small bowel wall thickening. No small bowel dilatation. The terminal ileum is normal. The appendix is not visualized, but there is no edema or inflammation in the region of the cecum. No gross colonic mass. No colonic wall thickening. No substantial diverticular change. Vascular/Lymphatic: There is abdominal aortic atherosclerosis without aneurysm. There is no gastrohepatic or hepatoduodenal ligament lymphadenopathy. No intraperitoneal or retroperitoneal lymphadenopathy. No pelvic sidewall lymphadenopathy. Reproductive: Uterus surgically absent.  There is no adnexal mass. Other: 1.5 x 1.9 cm nodule in the right abdominal mesentery (image 71/series 2) is new in the interval. 2.6 cm area of hazy focal attenuation in the small bowel mesentery is identified nearby (image 75/2). Similar haziness is seen in the left para-aortic space on image 74/2 and there is ill-defined soft tissue attenuation in the sigmoid mesocolon (see image 92/series 2). Infiltrative soft tissue stranding is noted in the extraperitoneal right pelvic sidewall with some apparent mass-effect on the right external iliac vein. Musculoskeletal: Bone windows reveal no worrisome lytic or sclerotic osseous lesions. Stable appearance L2 compression deformity. IMPRESSION: 1. Marked interval decrease in size of the right upper lobe pulmonary nodule with adjacent post radiation change. 2. Interval development of a 1.9 cm right mesenteric nodule. Metastatic disease not excluded and close attention recommended. 3. Interval development of several areas ill-defined soft tissue density and focal, but non confluent stranding in the mesentery and right pelvic sidewall. Comparing back to an older study from 04/29/2017, the patient did have a single area of focal non confluent soft tissue stranding at that time (that is now resolved). Areas of edema/infection/inflammation  could generate this appearance. Mesenteric hemorrhage would also be a consideration. This would not be a typical appearance for metastatic disease. 4.  Aortic Atherosclerois (ICD10-170.0) Electronically Signed   By: Misty Stanley M.D.   On: 01/09/2018 17:57    ASSESSMENT AND PLAN:  This is a very pleasant 57 years old white female with extensive stage small cell lung cancer status post systemic chemotherapy with carbo platinum and etoposide for 6 cycles and the patient rotated her treatment well except for chemotherapy-induced anemia and requirement for PRBCs and platelet transfusion. She had significant improvement in her disease with the chemotherapy. She also had prophylactic cranial irradiation. She recently underwent stereotactic radiotherapy to progressive right upper lobe pulmonary nodule. She had repeat CT scan of the chest, abdomen and pelvis performed recently.  I personally and independently reviewed the scan images and discussed the results with the patient and her husband.  Her scan showed significant improvement in the right upper lobe pulmonary nodule after the radiation.  There was development of 1.9 cm right mesenteric nodule and metastatic disease could not be excluded.  I discussed the scan results with the patient and recommended for her to continue on observation with close monitoring for the suspicious nodules. I will see her back for follow-up visit in 2 months for reevaluation with repeat CT scan of the chest, abdomen and pelvis.  If there is any further progression of this area, we will consider the patient for repeat biopsy or resuming her systemic therapy. For smoking cessation, I strongly encouraged the patient to quit smoking. For Port-A-Cath flush, we will arrange for the patient to have  flush every 6 weeks. She was advised to call immediately if she has any concerning symptoms in the interval. The patient voices understanding of current disease status and treatment options  and is in agreement with the current care plan. All questions were answered. The patient knows to call the clinic with any problems, questions or concerns. We can certainly see the patient much sooner if necessary.  Disclaimer: This note was dictated with voice recognition software. Similar sounding words can inadvertently be transcribed and may not be corrected upon review.

## 2018-03-13 ENCOUNTER — Inpatient Hospital Stay: Payer: Medicare Other | Attending: Internal Medicine

## 2018-03-13 ENCOUNTER — Ambulatory Visit (HOSPITAL_COMMUNITY)
Admission: RE | Admit: 2018-03-13 | Discharge: 2018-03-13 | Disposition: A | Discharge status: Home / Self Care | Payer: Medicare Other | Source: Ambulatory Visit | Attending: Internal Medicine | Admitting: Internal Medicine

## 2018-03-13 DIAGNOSIS — Z9221 Personal history of antineoplastic chemotherapy: Secondary | ICD-10-CM | POA: Insufficient documentation

## 2018-03-13 DIAGNOSIS — I700 Atherosclerosis of aorta: Secondary | ICD-10-CM | POA: Insufficient documentation

## 2018-03-13 DIAGNOSIS — C7889 Secondary malignant neoplasm of other digestive organs: Secondary | ICD-10-CM | POA: Insufficient documentation

## 2018-03-13 DIAGNOSIS — C778 Secondary and unspecified malignant neoplasm of lymph nodes of multiple regions: Secondary | ICD-10-CM | POA: Diagnosis not present

## 2018-03-13 DIAGNOSIS — I10 Essential (primary) hypertension: Secondary | ICD-10-CM | POA: Insufficient documentation

## 2018-03-13 DIAGNOSIS — Z85828 Personal history of other malignant neoplasm of skin: Secondary | ICD-10-CM | POA: Diagnosis not present

## 2018-03-13 DIAGNOSIS — Z923 Personal history of irradiation: Secondary | ICD-10-CM | POA: Insufficient documentation

## 2018-03-13 DIAGNOSIS — I2510 Atherosclerotic heart disease of native coronary artery without angina pectoris: Secondary | ICD-10-CM | POA: Diagnosis not present

## 2018-03-13 DIAGNOSIS — E785 Hyperlipidemia, unspecified: Secondary | ICD-10-CM | POA: Insufficient documentation

## 2018-03-13 DIAGNOSIS — Z95828 Presence of other vascular implants and grafts: Secondary | ICD-10-CM

## 2018-03-13 DIAGNOSIS — C3411 Malignant neoplasm of upper lobe, right bronchus or lung: Secondary | ICD-10-CM | POA: Diagnosis present

## 2018-03-13 DIAGNOSIS — F329 Major depressive disorder, single episode, unspecified: Secondary | ICD-10-CM | POA: Insufficient documentation

## 2018-03-13 DIAGNOSIS — M810 Age-related osteoporosis without current pathological fracture: Secondary | ICD-10-CM | POA: Diagnosis not present

## 2018-03-13 DIAGNOSIS — R918 Other nonspecific abnormal finding of lung field: Secondary | ICD-10-CM | POA: Insufficient documentation

## 2018-03-13 DIAGNOSIS — C3491 Malignant neoplasm of unspecified part of right bronchus or lung: Secondary | ICD-10-CM | POA: Diagnosis present

## 2018-03-13 DIAGNOSIS — Z79899 Other long term (current) drug therapy: Secondary | ICD-10-CM | POA: Insufficient documentation

## 2018-03-13 DIAGNOSIS — K449 Diaphragmatic hernia without obstruction or gangrene: Secondary | ICD-10-CM | POA: Insufficient documentation

## 2018-03-13 DIAGNOSIS — R0602 Shortness of breath: Secondary | ICD-10-CM | POA: Diagnosis not present

## 2018-03-13 MED ORDER — ALTEPLASE 2 MG IJ SOLR
2.0000 mg | Freq: Once | INTRAMUSCULAR | Status: AC | PRN
  Administered 2018-03-13: 2 mg
  Filled 2018-03-13: qty 2

## 2018-03-13 MED ORDER — IOPAMIDOL (ISOVUE-300) INJECTION 61%
INTRAVENOUS | Status: AC
  Filled 2018-03-13: qty 100

## 2018-03-13 MED ORDER — SODIUM CHLORIDE 0.9% FLUSH
10.0000 mL | INTRAVENOUS | Status: DC | PRN
  Administered 2018-03-13: 10 mL via INTRAVENOUS
  Filled 2018-03-13: qty 10

## 2018-03-13 MED ORDER — IOPAMIDOL (ISOVUE-300) INJECTION 61%
100.0000 mL | Freq: Once | INTRAVENOUS | Status: AC | PRN
  Administered 2018-03-13: 100 mL via INTRAVENOUS

## 2018-03-17 ENCOUNTER — Encounter: Payer: Self-pay | Admitting: Internal Medicine

## 2018-03-17 ENCOUNTER — Inpatient Hospital Stay (HOSPITAL_BASED_OUTPATIENT_CLINIC_OR_DEPARTMENT_OTHER): Payer: Medicare Other | Admitting: Internal Medicine

## 2018-03-17 ENCOUNTER — Telehealth: Payer: Self-pay | Admitting: Internal Medicine

## 2018-03-17 VITALS — BP 130/88 | HR 93 | Temp 98.3°F | Resp 18 | Ht 63.0 in | Wt 156.0 lb

## 2018-03-17 DIAGNOSIS — I10 Essential (primary) hypertension: Secondary | ICD-10-CM

## 2018-03-17 DIAGNOSIS — I700 Atherosclerosis of aorta: Secondary | ICD-10-CM

## 2018-03-17 DIAGNOSIS — R0602 Shortness of breath: Secondary | ICD-10-CM | POA: Diagnosis not present

## 2018-03-17 DIAGNOSIS — I2510 Atherosclerotic heart disease of native coronary artery without angina pectoris: Secondary | ICD-10-CM

## 2018-03-17 DIAGNOSIS — K449 Diaphragmatic hernia without obstruction or gangrene: Secondary | ICD-10-CM

## 2018-03-17 DIAGNOSIS — Z85828 Personal history of other malignant neoplasm of skin: Secondary | ICD-10-CM

## 2018-03-17 DIAGNOSIS — Z9221 Personal history of antineoplastic chemotherapy: Secondary | ICD-10-CM

## 2018-03-17 DIAGNOSIS — C7889 Secondary malignant neoplasm of other digestive organs: Secondary | ICD-10-CM | POA: Diagnosis not present

## 2018-03-17 DIAGNOSIS — C3411 Malignant neoplasm of upper lobe, right bronchus or lung: Secondary | ICD-10-CM

## 2018-03-17 DIAGNOSIS — F329 Major depressive disorder, single episode, unspecified: Secondary | ICD-10-CM

## 2018-03-17 DIAGNOSIS — C3491 Malignant neoplasm of unspecified part of right bronchus or lung: Secondary | ICD-10-CM

## 2018-03-17 DIAGNOSIS — C778 Secondary and unspecified malignant neoplasm of lymph nodes of multiple regions: Secondary | ICD-10-CM | POA: Diagnosis not present

## 2018-03-17 DIAGNOSIS — Z923 Personal history of irradiation: Secondary | ICD-10-CM

## 2018-03-17 DIAGNOSIS — E785 Hyperlipidemia, unspecified: Secondary | ICD-10-CM

## 2018-03-17 DIAGNOSIS — Z79899 Other long term (current) drug therapy: Secondary | ICD-10-CM

## 2018-03-17 DIAGNOSIS — M810 Age-related osteoporosis without current pathological fracture: Secondary | ICD-10-CM

## 2018-03-17 NOTE — Progress Notes (Signed)
[facility] [phone]   [phone]  OFFICE PROGRESS NOTE  [name], MD [address] [place] [address]  DIAGNOSIS: Extensive stage (T2a, N3, [name], large right anterior mediastinal and supraclavicular lymphadenopathy as well as pancreatic and splenic metastasis diagnosed in September 2017.  PRIOR THERAPY:  1) Systemic chemotherapy was carboplatin for AUC of 5 on day 1 and etoposide 100 MG/M2 on days 1, 2 and 3 with Neulasta support. Status post 6 cycles with significant response of her disease. 2) Prophylactic cranial irradiation under the care of Dr. Sondra Come on 12/02/2016. 3) stereotactic radiotherapy to the recurrent right upper lobe pulmonary nodule under the care of Dr. Sondra Come completed November 10, 2017.  CURRENT THERAPY: Observation.  INTERVAL HISTORY: Debra Kidd 57 y.o. female returns to the clinic today for follow-up visit accompanied by her husband.  The patient is feeling fine today with no concerning complaints except for shortness of breath with exertion.  She denied having any current chest pain, cough or hemoptysis.  She denied having any fever or chills.  She has no nausea, vomiting, diarrhea or constipation.  She denied having any recent weight loss or night sweats.  The patient has no headache or visual changes.  She had a repeat CT scan of the chest, abdomen and pelvis performed recently and she is here for evaluation and discussion of her risk her results.   MEDICAL HISTORY: Past Medical History:  Diagnosis Date  . 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 Blood type, Rh positive   . Cancer ([place])   . Chronic pain syndrome   . Depression 08/07/2016  . Encounter for antineoplastic chemotherapy 07/17/2016  . History of external beam radiation therapy 11/19/16-12/02/16   brain 25 Gy in 10 fractions  . Hyperlipidemia   . Hypertension   . Hypertension  08/07/2016  . Osteoporosis     ALLERGIES:  is allergic to codeine; motrin [ibuprofen]; thiazide-type diuretics; and vicodin [hydrocodone-acetaminophen].  MEDICATIONS:  Current Outpatient Medications  Medication Sig Dispense Refill  . celecoxib (CELEBREX) 200 MG capsule Take 200 mg by mouth daily.    . diphenhydrAMINE (BENADRYL) 25 MG tablet Take 25 mg by mouth every 6 (six) hours as needed.    . lidocaine-prilocaine (EMLA) cream Apply 1 application topically as needed. 30 g 0  . LORazepam (ATIVAN) 1 MG tablet Take 1 mg by mouth 2 (two) times daily.    . traMADol (ULTRAM) 50 MG tablet Take by mouth every 6 (six) hours as needed.     No current facility-administered medications for this visit.     SURGICAL HISTORY:  Past Surgical History:  Procedure Laterality Date  . ABDOMINAL HYSTERECTOMY  1981  . APPENDECTOMY     at age 81  . EYE SURGERY     left  . IR GENERIC HISTORICAL  06/03/2016   IR FLUORO GUIDE PORT INSERTION RIGHT 06/03/2016 [facility]  . IR GENERIC HISTORICAL  06/03/2016   IR US GUIDE VASC ACCESS RIGHT 06/03/2016 [facility]  . SKIN CANCER EXCISION     basal cell carcinoma L side of face    REVIEW OF SYSTEMS:  A comprehensive review of systems was negative except for: Respiratory: positive for dyspnea on exertion   PHYSICAL EXAMINATION: General appearance: alert, cooperative and no distress Head: Normocephalic, without obvious abnormality, atraumatic Neck: no adenopathy, no JVD,  supple, symmetrical, trachea midline and thyroid not enlarged, symmetric, no tenderness/mass/nodules Lymph nodes: Cervical, supraclavicular, and axillary nodes normal. Resp: clear to auscultation bilaterally Back: symmetric, no curvature. ROM normal. No CVA tenderness. Cardio: regular rate and rhythm, S1, S2 normal, no murmur, click, rub or gallop GI: soft, non-tender; bowel sounds normal; no masses,  no organomegaly Extremities: extremities normal, atraumatic, no cyanosis or edema  ECOG  PERFORMANCE STATUS: 1 - Symptomatic but completely ambulatory  Blood pressure 130/88, pulse 93, temperature 98.3 F (36.8 C), temperature source Oral, resp. rate 18, height 5\' 3"  (1.6 m), weight 156 lb (70.8 kg), SpO2 98 %.  LABORATORY DATA: Lab Results  Component Value Date   WBC 6.5 01/09/2018   HGB 12.2 01/09/2018   HCT 35.7 01/09/2018   MCV 98.9 01/09/2018   PLT 243 01/09/2018      Chemistry      Component Value Date/Time   NA 137 01/09/2018 0738   NA 141 08/01/2017 0835   K 3.9 01/09/2018 0738   K 3.5 08/01/2017 0835   CL 102 01/09/2018 0738   CO2 27 01/09/2018 0738   CO2 25 08/01/2017 0835   BUN 11 01/09/2018 0738   BUN 8.0 08/01/2017 0835   CREATININE 1.00 01/09/2018 0738   CREATININE 0.9 08/01/2017 0835      Component Value Date/Time   CALCIUM 9.1 01/09/2018 0738   CALCIUM 9.9 08/01/2017 0835   ALKPHOS 115 01/09/2018 0738   ALKPHOS 142 08/01/2017 0835   AST 16 01/09/2018 0738   AST 15 08/01/2017 0835   ALT 18 01/09/2018 0738   ALT 19 08/01/2017 0835   BILITOT 0.3 01/09/2018 0738   BILITOT 0.35 08/01/2017 0835       RADIOGRAPHIC STUDIES: Ct Chest W Contrast  Result Date: 03/13/2018 CLINICAL DATA:  Lung cancer diagnosed September 2017. Chemotherapy completed February 2018. Radiation therapy completed March 2018. EXAM: CT CHEST, ABDOMEN, AND PELVIS WITH CONTRAST TECHNIQUE: Multidetector CT imaging of the chest, abdomen and pelvis was performed following the standard protocol during bolus administration of intravenous contrast. CONTRAST:  1101mL ISOVUE-300 IOPAMIDOL (ISOVUE-300) INJECTION 61% COMPARISON:  CT 08/01/2017 FINDINGS: CT CHEST FINDINGS Cardiovascular: Port in the anterior chest wall with tip in distal SVC. Coronary artery calcification and aortic atherosclerotic calcification. Mediastinum/Nodes: No axillary or supraclavicular adenopathy. No mediastinal or hilar adenopathy. No pericardial effusion. Lungs/Pleura: Pleural-parenchymal thickening at the RIGHT  lung apex is slightly increased. Nodular portion exam is similar measuring 6 mm on coronal image 85 compared to 5 mm on prior. Musculoskeletal: No aggressive osseous lesion. CT ABDOMEN AND PELVIS FINDINGS Hepatobiliary: No focal hepatic lesion. No biliary ductal dilatation. Gallbladder is normal. Common bile duct is normal. Pancreas: Pancreas is normal. No ductal dilatation. No pancreatic inflammation. Spleen: Normal spleen Adrenals/urinary tract: Adrenal glands and kidneys are normal. The ureters and bladder normal. Stomach/Bowel: Small hiatal hernia. Small paraesophageal lymph nodes less than 5 mm short axis. Duodenum small bowel are normal. Cecum normal. Colon and rectosigmoid colon normal. Haziness within the mesentery of the small bowel similar to comparison exam. This haziness in the RIGHT upper quadrant and the central lower abdomen. A nodule in the RIGHT upper quadrant is improved. New nodular haziness adjacent to the gastric antrum and falciform ligament measuring 10 mm (image 58/2). Nodule associated with the mesenteric haziness in the RIGHT upper quadrant has resolved in the interval. Vascular/Lymphatic: Abdominal aorta is normal caliber with atherosclerotic calcification. There is no retroperitoneal or periportal lymphadenopathy. No pelvic lymphadenopathy. Reproductive: Post hysterectomy anatomy Other: No  free fluid. Musculoskeletal: No aggressive osseous lesion. IMPRESSION: Chest Impression: 1. Presumed treatment site in the RIGHT lung apex has increased pleuroparenchymal thickening which is favored benign post therapy change. Nodular portion of this lesion is not changed in size. 2. No evidence of lung cancer progression or new metastatic disease in thorax. Abdomen / Pelvis Impression: 1. Persistent haziness within the small bowel mesentery with waxing and waning mesenteric nodules. Undetermined etiology but unlikely metastatic pattern. 2. No liver metastasis.  Adrenal glands normal. Electronically  Signed   By: Suzy Bouchard M.D.   On: 03/13/2018 13:39   Ct Abdomen Pelvis W Contrast  Result Date: 03/13/2018 CLINICAL DATA:  Lung cancer diagnosed September 2017. Chemotherapy completed February 2018. Radiation therapy completed March 2018. EXAM: CT CHEST, ABDOMEN, AND PELVIS WITH CONTRAST TECHNIQUE: Multidetector CT imaging of the chest, abdomen and pelvis was performed following the standard protocol during bolus administration of intravenous contrast. CONTRAST:  119mL ISOVUE-300 IOPAMIDOL (ISOVUE-300) INJECTION 61% COMPARISON:  CT 08/01/2017 FINDINGS: CT CHEST FINDINGS Cardiovascular: Port in the anterior chest wall with tip in distal SVC. Coronary artery calcification and aortic atherosclerotic calcification. Mediastinum/Nodes: No axillary or supraclavicular adenopathy. No mediastinal or hilar adenopathy. No pericardial effusion. Lungs/Pleura: Pleural-parenchymal thickening at the RIGHT lung apex is slightly increased. Nodular portion exam is similar measuring 6 mm on coronal image 85 compared to 5 mm on prior. Musculoskeletal: No aggressive osseous lesion. CT ABDOMEN AND PELVIS FINDINGS Hepatobiliary: No focal hepatic lesion. No biliary ductal dilatation. Gallbladder is normal. Common bile duct is normal. Pancreas: Pancreas is normal. No ductal dilatation. No pancreatic inflammation. Spleen: Normal spleen Adrenals/urinary tract: Adrenal glands and kidneys are normal. The ureters and bladder normal. Stomach/Bowel: Small hiatal hernia. Small paraesophageal lymph nodes less than 5 mm short axis. Duodenum small bowel are normal. Cecum normal. Colon and rectosigmoid colon normal. Haziness within the mesentery of the small bowel similar to comparison exam. This haziness in the RIGHT upper quadrant and the central lower abdomen. A nodule in the RIGHT upper quadrant is improved. New nodular haziness adjacent to the gastric antrum and falciform ligament measuring 10 mm (image 58/2). Nodule associated with the  mesenteric haziness in the RIGHT upper quadrant has resolved in the interval. Vascular/Lymphatic: Abdominal aorta is normal caliber with atherosclerotic calcification. There is no retroperitoneal or periportal lymphadenopathy. No pelvic lymphadenopathy. Reproductive: Post hysterectomy anatomy Other: No free fluid. Musculoskeletal: No aggressive osseous lesion. IMPRESSION: Chest Impression: 1. Presumed treatment site in the RIGHT lung apex has increased pleuroparenchymal thickening which is favored benign post therapy change. Nodular portion of this lesion is not changed in size. 2. No evidence of lung cancer progression or new metastatic disease in thorax. Abdomen / Pelvis Impression: 1. Persistent haziness within the small bowel mesentery with waxing and waning mesenteric nodules. Undetermined etiology but unlikely metastatic pattern. 2. No liver metastasis.  Adrenal glands normal. Electronically Signed   By: Suzy Bouchard M.D.   On: 03/13/2018 13:39    ASSESSMENT AND PLAN:  This is a very pleasant 57 years old white female with extensive stage small cell lung cancer status post systemic chemotherapy with carbo platinum and etoposide for 6 cycles and the patient rotated her treatment well except for chemotherapy-induced anemia and requirement for PRBCs and platelet transfusion. She had significant improvement in her disease with the chemotherapy. She also had prophylactic cranial irradiation. She also underwent stereotactic radiotherapy to progressive right upper lobe pulmonary nodule. She had a repeat CT scan of the chest, abdomen and pelvis performed  recently.  I personally and independently reviewed the scans and discussed the results with the patient and her husband today.  Her scan showed no concerning findings for disease progression. I recommended for the patient to continue on observation with repeat CT scan of the chest, abdomen and pelvis in 3 months.  She was advised to call immediately if  she has any concerning symptoms in the interval. The patient voices understanding of current disease status and treatment options and is in agreement with the current care plan. All questions were answered. The patient knows to call the clinic with any problems, questions or concerns. We can certainly see the patient much sooner if necessary.  Disclaimer: This note was dictated with voice recognition software. Similar sounding words can inadvertently be transcribed and may not be corrected upon review.

## 2018-03-17 NOTE — Telephone Encounter (Signed)
Scheduled appt per 7/16 los - gave patient aVS and calender per los. - central radiology to contact patient with ct scan.

## 2018-04-28 ENCOUNTER — Inpatient Hospital Stay: Payer: Medicare Other | Attending: Internal Medicine

## 2018-04-28 DIAGNOSIS — Z452 Encounter for adjustment and management of vascular access device: Secondary | ICD-10-CM | POA: Diagnosis not present

## 2018-04-28 DIAGNOSIS — C3491 Malignant neoplasm of unspecified part of right bronchus or lung: Secondary | ICD-10-CM

## 2018-04-28 DIAGNOSIS — C3411 Malignant neoplasm of upper lobe, right bronchus or lung: Secondary | ICD-10-CM | POA: Diagnosis not present

## 2018-04-28 MED ORDER — SODIUM CHLORIDE 0.9 % IJ SOLN
10.0000 mL | Freq: Once | INTRAMUSCULAR | Status: AC
  Administered 2018-04-28: 10 mL via INTRAVENOUS
  Filled 2018-04-28: qty 10

## 2018-04-28 MED ORDER — HEPARIN SOD (PORK) LOCK FLUSH 100 UNIT/ML IV SOLN
500.0000 [IU] | Freq: Once | INTRAVENOUS | Status: AC
  Administered 2018-04-28: 500 [IU] via INTRAVENOUS
  Filled 2018-04-28: qty 5

## 2018-06-09 ENCOUNTER — Ambulatory Visit (HOSPITAL_COMMUNITY)
Admission: RE | Admit: 2018-06-09 | Discharge: 2018-06-09 | Disposition: A | Discharge status: Home / Self Care | Payer: Medicare Other | Source: Ambulatory Visit | Attending: Internal Medicine | Admitting: Internal Medicine

## 2018-06-09 ENCOUNTER — Inpatient Hospital Stay: Payer: Medicare Other

## 2018-06-09 ENCOUNTER — Encounter (HOSPITAL_COMMUNITY): Payer: Self-pay

## 2018-06-09 ENCOUNTER — Inpatient Hospital Stay: Payer: Medicare Other | Attending: Internal Medicine

## 2018-06-09 DIAGNOSIS — I700 Atherosclerosis of aorta: Secondary | ICD-10-CM | POA: Insufficient documentation

## 2018-06-09 DIAGNOSIS — Z85828 Personal history of other malignant neoplasm of skin: Secondary | ICD-10-CM | POA: Diagnosis not present

## 2018-06-09 DIAGNOSIS — Z5111 Encounter for antineoplastic chemotherapy: Secondary | ICD-10-CM | POA: Insufficient documentation

## 2018-06-09 DIAGNOSIS — M810 Age-related osteoporosis without current pathological fracture: Secondary | ICD-10-CM | POA: Diagnosis not present

## 2018-06-09 DIAGNOSIS — R110 Nausea: Secondary | ICD-10-CM | POA: Insufficient documentation

## 2018-06-09 DIAGNOSIS — I2510 Atherosclerotic heart disease of native coronary artery without angina pectoris: Secondary | ICD-10-CM | POA: Insufficient documentation

## 2018-06-09 DIAGNOSIS — C778 Secondary and unspecified malignant neoplasm of lymph nodes of multiple regions: Secondary | ICD-10-CM | POA: Insufficient documentation

## 2018-06-09 DIAGNOSIS — Z23 Encounter for immunization: Secondary | ICD-10-CM | POA: Insufficient documentation

## 2018-06-09 DIAGNOSIS — Z5112 Encounter for antineoplastic immunotherapy: Secondary | ICD-10-CM | POA: Insufficient documentation

## 2018-06-09 DIAGNOSIS — C7889 Secondary malignant neoplasm of other digestive organs: Secondary | ICD-10-CM | POA: Diagnosis not present

## 2018-06-09 DIAGNOSIS — E785 Hyperlipidemia, unspecified: Secondary | ICD-10-CM | POA: Diagnosis not present

## 2018-06-09 DIAGNOSIS — G894 Chronic pain syndrome: Secondary | ICD-10-CM | POA: Insufficient documentation

## 2018-06-09 DIAGNOSIS — R918 Other nonspecific abnormal finding of lung field: Secondary | ICD-10-CM | POA: Insufficient documentation

## 2018-06-09 DIAGNOSIS — I10 Essential (primary) hypertension: Secondary | ICD-10-CM | POA: Diagnosis not present

## 2018-06-09 DIAGNOSIS — C3491 Malignant neoplasm of unspecified part of right bronchus or lung: Secondary | ICD-10-CM

## 2018-06-09 DIAGNOSIS — C3411 Malignant neoplasm of upper lobe, right bronchus or lung: Secondary | ICD-10-CM | POA: Diagnosis not present

## 2018-06-09 LAB — CBC WITH DIFFERENTIAL (CANCER CENTER ONLY)
BASOS ABS: 0 10*3/uL (ref 0.0–0.1)
BASOS PCT: 0 %
EOS ABS: 0.1 10*3/uL (ref 0.0–0.5)
Eosinophils Relative: 2 %
HCT: 40.5 % (ref 34.8–46.6)
HEMOGLOBIN: 13.4 g/dL (ref 12.0–15.0)
Lymphocytes Relative: 19 %
Lymphs Abs: 1.6 10*3/uL (ref 0.9–3.3)
MCH: 34.9 pg — ABNORMAL HIGH (ref 25.1–34.0)
MCHC: 33.1 g/dL (ref 31.5–36.0)
MCV: 105.5 fL — ABNORMAL HIGH (ref 79.5–101.0)
Monocytes Absolute: 0.6 10*3/uL (ref 0.1–0.9)
Monocytes Relative: 7 %
NEUTROS ABS: 6.1 10*3/uL (ref 1.5–6.5)
NEUTROS PCT: 72 %
PLATELETS: 277 10*3/uL (ref 150–400)
RBC: 3.84 MIL/uL (ref 3.70–5.45)
RDW: 13.9 % (ref 11.2–14.5)
WBC Count: 8.4 10*3/uL (ref 4.0–10.5)
nRBC: 0 % (ref 0.0–0.2)

## 2018-06-09 LAB — CMP (CANCER CENTER ONLY)
ALBUMIN: 3.5 g/dL (ref 3.5–5.0)
ALK PHOS: 133 U/L — AB (ref 38–126)
ALT: 18 U/L (ref 0–44)
ANION GAP: 12 (ref 5–15)
AST: 18 U/L (ref 15–41)
BUN: 7 mg/dL (ref 6–20)
CALCIUM: 9.3 mg/dL (ref 8.9–10.3)
CHLORIDE: 103 mmol/L (ref 98–111)
CO2: 26 mmol/L (ref 22–32)
Creatinine: 0.88 mg/dL (ref 0.44–1.00)
GFR, Est AFR Am: >60 mL/min (ref >60)
GFR, Estimated: >60 mL/min (ref >60)
GLUCOSE: 102 mg/dL — AB (ref 70–99)
Potassium: 3.3 mmol/L — ABNORMAL LOW (ref 3.5–5.1)
Sodium: 141 mmol/L (ref 135–145)
Total Bilirubin: 0.3 mg/dL (ref 0.3–1.2)
Total Protein: 7.4 g/dL (ref 6.5–8.1)

## 2018-06-09 MED ORDER — SODIUM CHLORIDE 0.9 % IJ SOLN
INTRAMUSCULAR | Status: AC
  Filled 2018-06-09: qty 50

## 2018-06-09 MED ORDER — SODIUM CHLORIDE 0.9% FLUSH
10.0000 mL | INTRAVENOUS | Status: DC | PRN
  Administered 2018-06-09: 10 mL
  Filled 2018-06-09: qty 10

## 2018-06-09 MED ORDER — HEPARIN SOD (PORK) LOCK FLUSH 100 UNIT/ML IV SOLN
500.0000 [IU] | Freq: Once | INTRAVENOUS | Status: DC | PRN
  Filled 2018-06-09: qty 5

## 2018-06-09 MED ORDER — IOHEXOL 300 MG/ML  SOLN
100.0000 mL | Freq: Once | INTRAMUSCULAR | Status: AC | PRN
  Administered 2018-06-09: 100 mL via INTRAVENOUS

## 2018-06-09 MED ORDER — HEPARIN SOD (PORK) LOCK FLUSH 100 UNIT/ML IV SOLN
500.0000 [IU] | Freq: Once | INTRAVENOUS | Status: AC
  Administered 2018-06-09: 500 [IU] via INTRAVENOUS

## 2018-06-09 MED ORDER — HEPARIN SOD (PORK) LOCK FLUSH 100 UNIT/ML IV SOLN
INTRAVENOUS | Status: AC
  Administered 2018-06-09: 500 [IU] via INTRAVENOUS
  Filled 2018-06-09: qty 5

## 2018-06-11 ENCOUNTER — Inpatient Hospital Stay (HOSPITAL_BASED_OUTPATIENT_CLINIC_OR_DEPARTMENT_OTHER): Payer: Medicare Other | Admitting: Internal Medicine

## 2018-06-11 ENCOUNTER — Other Ambulatory Visit: Payer: Self-pay | Admitting: Internal Medicine

## 2018-06-11 ENCOUNTER — Telehealth: Payer: Self-pay | Admitting: Internal Medicine

## 2018-06-11 ENCOUNTER — Encounter: Payer: Self-pay | Admitting: Internal Medicine

## 2018-06-11 VITALS — BP 123/89 | HR 103 | Temp 98.0°F | Resp 18 | Ht 63.0 in | Wt 154.5 lb

## 2018-06-11 DIAGNOSIS — Z85828 Personal history of other malignant neoplasm of skin: Secondary | ICD-10-CM

## 2018-06-11 DIAGNOSIS — Z23 Encounter for immunization: Secondary | ICD-10-CM | POA: Diagnosis not present

## 2018-06-11 DIAGNOSIS — C7889 Secondary malignant neoplasm of other digestive organs: Secondary | ICD-10-CM

## 2018-06-11 DIAGNOSIS — C3491 Malignant neoplasm of unspecified part of right bronchus or lung: Secondary | ICD-10-CM

## 2018-06-11 DIAGNOSIS — R5382 Chronic fatigue, unspecified: Secondary | ICD-10-CM

## 2018-06-11 DIAGNOSIS — Z5112 Encounter for antineoplastic immunotherapy: Secondary | ICD-10-CM

## 2018-06-11 DIAGNOSIS — M810 Age-related osteoporosis without current pathological fracture: Secondary | ICD-10-CM

## 2018-06-11 DIAGNOSIS — C778 Secondary and unspecified malignant neoplasm of lymph nodes of multiple regions: Secondary | ICD-10-CM

## 2018-06-11 DIAGNOSIS — I2510 Atherosclerotic heart disease of native coronary artery without angina pectoris: Secondary | ICD-10-CM

## 2018-06-11 DIAGNOSIS — E785 Hyperlipidemia, unspecified: Secondary | ICD-10-CM

## 2018-06-11 DIAGNOSIS — I700 Atherosclerosis of aorta: Secondary | ICD-10-CM

## 2018-06-11 DIAGNOSIS — Z7189 Other specified counseling: Secondary | ICD-10-CM | POA: Insufficient documentation

## 2018-06-11 DIAGNOSIS — G894 Chronic pain syndrome: Secondary | ICD-10-CM

## 2018-06-11 DIAGNOSIS — C3411 Malignant neoplasm of upper lobe, right bronchus or lung: Secondary | ICD-10-CM

## 2018-06-11 DIAGNOSIS — R110 Nausea: Secondary | ICD-10-CM

## 2018-06-11 DIAGNOSIS — Z5111 Encounter for antineoplastic chemotherapy: Secondary | ICD-10-CM

## 2018-06-11 DIAGNOSIS — I10 Essential (primary) hypertension: Secondary | ICD-10-CM

## 2018-06-11 MED ORDER — INFLUENZA VAC SPLIT QUAD 0.5 ML IM SUSY
0.5000 mL | PREFILLED_SYRINGE | Freq: Once | INTRAMUSCULAR | Status: AC
  Administered 2018-06-11: 0.5 mL via INTRAMUSCULAR

## 2018-06-11 MED ORDER — PROCHLORPERAZINE MALEATE 10 MG PO TABS: 10.0000 mg | ORAL_TABLET | Freq: Four times a day (QID) | ORAL | 0 refills | Status: DC | PRN

## 2018-06-11 MED ORDER — INFLUENZA VAC SPLIT QUAD 0.5 ML IM SUSY
PREFILLED_SYRINGE | INTRAMUSCULAR | Status: AC
  Filled 2018-06-11: qty 0.5

## 2018-06-11 NOTE — Addendum Note (Signed)
Addended by: Lucile Crater on: 06/11/2018 09:45 AM   Modules accepted: Orders

## 2018-06-11 NOTE — Progress Notes (Signed)
[place] [phone]   [phone]  OFFICE PROGRESS NOTE  [name], MD [address] [place] [address]  DIAGNOSIS: Extensive stage (T2a, N3, [name], large right anterior mediastinal and supraclavicular lymphadenopathy as well as pancreatic and splenic metastasis diagnosed in September 2017.  The patient had disease progression in October 2019.  PRIOR THERAPY:  1) Systemic chemotherapy was carboplatin for AUC of 5 on day 1 and etoposide 100 MG/M2 on days 1, 2 and 3 with Neulasta support. Status post 6 cycles with significant response of her disease. 2) Prophylactic cranial irradiation under the care of Dr. Sondra Come on 12/02/2016. 3) stereotactic radiotherapy to the recurrent right upper lobe pulmonary nodule under the care of Dr. Sondra Come completed November 10, 2017.  CURRENT THERAPY: Rate treatment with systemic chemotherapy with carboplatin for AUC of 5 on day 1 and etoposide 100 mg/M2 on days 1, 2 and 3 as well as Tecentriq (Atezolizumab) 1200 mg IV every 3 weeks with Neulasta support.  First dose June 22, 2018 for disease recurrence.  INTERVAL HISTORY: Debra Kidd 57 y.o. female returns to the clinic today for follow-up visit accompanied by her husband.  The patient is feeling fine today with no specific complaints except for mild cough.  She denied having any chest pain, shortness breath or hemoptysis.  She denied having any fever or chills.  She has no nausea, vomiting, abdominal pain, diarrhea or constipation.  She denied having any significant weight loss or night sweats.  She has no headache or visual changes.  The patient has been in observation for close to 2 years.  She had repeat CT scan of the chest, abdomen and pelvis performed recently and she is here for evaluation and discussion of her risk her results.  MEDICAL HISTORY: Past Medical History:  Diagnosis Date  . 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 Blood type, Rh positive   . Cancer ([place])   . Chronic pain syndrome   . Depression 08/07/2016  . Encounter for antineoplastic chemotherapy 07/17/2016  . History of external beam radiation therapy 11/19/16-12/02/16   brain 25 Gy in 10 fractions  . Hyperlipidemia   . Hypertension   . Hypertension 08/07/2016  . Osteoporosis     ALLERGIES:  is allergic to codeine; motrin [ibuprofen]; thiazide-type diuretics; and vicodin [hydrocodone-acetaminophen].  MEDICATIONS:  Current Outpatient Medications  Medication Sig Dispense Refill  . celecoxib (CELEBREX) 200 MG capsule Take 200 mg by mouth daily.    . diphenhydrAMINE (BENADRYL) 25 MG tablet Take 25 mg by mouth every 6 (six) hours as needed.    . lidocaine-prilocaine (EMLA) cream Apply 1 application topically as needed. 30 g 0  . LORazepam (ATIVAN) 1 MG tablet Take 1 mg by mouth 2 (two) times daily.    . traMADol (ULTRAM) 50 MG tablet Take by mouth every 6 (six) hours as needed.     No current facility-administered medications for this visit.     SURGICAL HISTORY:  Past Surgical History:  Procedure Laterality Date  . ABDOMINAL HYSTERECTOMY  1981  . APPENDECTOMY     at age 60  . EYE SURGERY     left  . IR GENERIC HISTORICAL  06/03/2016   IR FLUORO GUIDE PORT INSERTION RIGHT 06/03/2016 [facility]  . IR GENERIC HISTORICAL  06/03/2016   IR US GUIDE VASC ACCESS RIGHT  06/03/2016 [facility]  . SKIN CANCER EXCISION     basal cell carcinoma L side of face    REVIEW OF SYSTEMS:  Constitutional: positive for fatigue Eyes: negative Ears, nose, mouth, throat, and face: negative Respiratory: positive for cough Cardiovascular: negative Gastrointestinal: negative Genitourinary:negative Integument/breast: negative Hematologic/lymphatic: negative Musculoskeletal:negative Neurological: negative Behavioral/Psych: negative Endocrine: negative Allergic/Immunologic: negative   PHYSICAL EXAMINATION:  General appearance: alert, cooperative, fatigued and no distress Head: Normocephalic, without obvious abnormality, atraumatic Neck: no adenopathy, no JVD, supple, symmetrical, trachea midline and thyroid not enlarged, symmetric, no tenderness/mass/nodules Lymph nodes: Cervical, supraclavicular, and axillary nodes normal. Resp: clear to auscultation bilaterally Back: symmetric, no curvature. ROM normal. No CVA tenderness. Cardio: regular rate and rhythm, S1, S2 normal, no murmur, click, rub or gallop GI: soft, non-tender; bowel sounds normal; no masses,  no organomegaly Extremities: extremities normal, atraumatic, no cyanosis or edema Neurologic: Alert and oriented X 3, normal strength and tone. Normal symmetric reflexes. Normal coordination and gait  ECOG PERFORMANCE STATUS: 1 - Symptomatic but completely ambulatory  Blood pressure 123/89, pulse (!) 103, temperature 98 F (36.7 C), temperature source Oral, resp. rate 18, height 5\' 3"  (1.6 m), weight 154 lb 8 oz (70.1 kg), SpO2 99 %.  LABORATORY DATA: Lab Results  Component Value Date   WBC 8.4 06/09/2018   HGB 13.4 06/09/2018   HCT 40.5 06/09/2018   MCV 105.5 (H) 06/09/2018   PLT 277 06/09/2018      Chemistry      Component Value Date/Time   NA 141 06/09/2018 0859   NA 141 08/01/2017 0835   K 3.3 (L) 06/09/2018 0859   K 3.5 08/01/2017 0835   CL 103 06/09/2018 0859   CO2 26 06/09/2018 0859   CO2 25 08/01/2017 0835   BUN 7 06/09/2018 0859   BUN 8.0 08/01/2017 0835   CREATININE 0.88 06/09/2018 0859   CREATININE 0.9 08/01/2017 0835      Component Value Date/Time   CALCIUM 9.3 06/09/2018 0859   CALCIUM 9.9 08/01/2017 0835   ALKPHOS 133 (H) 06/09/2018 0859   ALKPHOS 142 08/01/2017 0835   AST 18 06/09/2018 0859   AST 15 08/01/2017 0835   ALT 18 06/09/2018 0859   ALT 19 08/01/2017 0835   BILITOT 0.3 06/09/2018 0859   BILITOT 0.35 08/01/2017 0835       RADIOGRAPHIC STUDIES: Ct Chest W Contrast  Result Date:  06/09/2018 CLINICAL DATA:  57 year old female with history of right-sided lung cancer diagnosed in 2017 status post chemotherapy and radiation therapy which is now complete. Follow-up study. EXAM: CT CHEST, ABDOMEN, AND PELVIS WITH CONTRAST TECHNIQUE: Multidetector CT imaging of the chest, abdomen and pelvis was performed following the standard protocol during bolus administration of intravenous contrast. CONTRAST:  188mL OMNIPAQUE IOHEXOL 300 MG/ML  SOLN COMPARISON:  CT the chest, abdomen and pelvis 03/13/2018. FINDINGS: CT CHEST FINDINGS Cardiovascular: Heart size is normal. There is no significant pericardial fluid, thickening or pericardial calcification. There is aortic atherosclerosis, as well as atherosclerosis of the great vessels of the mediastinum and the coronary arteries, including calcified atherosclerotic plaque in the left anterior descending coronary artery. Right internal jugular single-lumen porta cath with tip terminating at the superior cavoatrial junction. Mediastinum/Nodes: No pathologically enlarged mediastinal or hilar lymph nodes. Esophagus is unremarkable in appearance. No axillary lymphadenopathy. Lungs/Pleura: There continues to be extensive ground-glass attenuation and pleuroparenchymal architectural distortion in the apex of the right upper lobe related to prior radiation therapy. The largest nodular area in this region has  increased in size, currently measuring 1.6 x 2.0 x 1.4 cm (axial image 28 of series 7 and coronal image 79 of series 5). No other definite suspicious appearing pulmonary nodules or masses are noted elsewhere in the lungs. No acute consolidative airspace disease. No pleural effusions. Musculoskeletal: There are no aggressive appearing lytic or blastic lesions noted in the visualized portions of the skeleton. CT ABDOMEN PELVIS FINDINGS Hepatobiliary: Wedge-shaped area of low attenuation and/or hypoperfusion in segment 4B of the liver adjacent to the falciform ligament,  strongly favored to represent a benign perfusion anomaly. No other definite suspicious appearing hepatic lesions. No intra or extrahepatic biliary ductal dilatation. Gallbladder is normal in appearance. Pancreas: No pancreatic mass. No pancreatic ductal dilatation. No pancreatic or peripancreatic fluid or inflammatory changes. Spleen: Unremarkable. Adrenals/Urinary Tract: Bilateral kidneys and bilateral adrenal glands are normal in appearance. No hydroureteronephrosis. Urinary bladder is normal in appearance. Stomach/Bowel: Normal appearance of the stomach. No pathologic dilatation of small bowel or colon. Appendix is not confidently identified may be surgically absent. Vascular/Lymphatic: Aortic atherosclerosis, without evidence of aneurysm or dissection noted in the abdominal or pelvic vasculature. No lymphadenopathy noted in the abdomen or pelvis. Reproductive: Status post hysterectomy. Ovaries are not confidently identified may be surgically absent or atrophic. Other: There continues to be haziness in the small bowel mesentery, best appreciated on axial image 76 of series 2, which has progressively worsened over the prior 2 examinations. Similar worsening haziness is also noted in the sigmoid mesocolon (axial image 99 of series 2). As well, nodular thickening and surrounding haziness is noted along the anterior surface of the right iliacus musculature and in the adjacent retroperitoneal fat best appreciated on axial image 83 of series 2. These findings are all concerning for sites of metastatic disease both in the mesentery, sigmoid mesocolon and right retroperitoneum. No significant volume of ascites. No pneumoperitoneum. Musculoskeletal: Chronic compression fracture of L1 with 25% loss of anterior vertebral body height (unchanged). There are no aggressive appearing lytic or blastic lesions noted in the visualized portions of the skeleton. IMPRESSION: 1. Worsening areas of soft tissue haziness in the small  bowel mesentery, sigmoid mesocolon, as well as nodularity and soft tissue haziness in the right hemipelvis in the retroperitoneum, all of which are unusual in appearance but concerning for sites of progressive metastatic disease. 2. Slight increased nodularity in the periphery of the right upper lobe near the apex. This may simply reflect progressive postradiation mass-like fibrosis, however, the possibility of residual/recurrent disease in this area is not excluded, and close attention on follow-up studies is recommended. 3. Aortic atherosclerosis, in addition to left anterior descending coronary artery disease. Please note that although the presence of coronary artery calcium documents the presence of coronary artery disease, the severity of this disease and any potential stenosis cannot be assessed on this non-gated CT examination. Assessment for potential risk factor modification, dietary therapy or pharmacologic therapy may be warranted, if clinically indicated. 4. Additional incidental findings, as above. Aortic Atherosclerosis (ICD10-I70.0). Electronically Signed   By: Vinnie Langton M.D.   On: 06/09/2018 15:13   Ct Abdomen Pelvis W Contrast  Result Date: 06/09/2018 CLINICAL DATA:  57 year old female with history of right-sided lung cancer diagnosed in 2017 status post chemotherapy and radiation therapy which is now complete. Follow-up study. EXAM: CT CHEST, ABDOMEN, AND PELVIS WITH CONTRAST TECHNIQUE: Multidetector CT imaging of the chest, abdomen and pelvis was performed following the standard protocol during bolus administration of intravenous contrast. CONTRAST:  121mL OMNIPAQUE IOHEXOL 300  MG/ML  SOLN COMPARISON:  CT the chest, abdomen and pelvis 03/13/2018. FINDINGS: CT CHEST FINDINGS Cardiovascular: Heart size is normal. There is no significant pericardial fluid, thickening or pericardial calcification. There is aortic atherosclerosis, as well as atherosclerosis of the great vessels of the  mediastinum and the coronary arteries, including calcified atherosclerotic plaque in the left anterior descending coronary artery. Right internal jugular single-lumen porta cath with tip terminating at the superior cavoatrial junction. Mediastinum/Nodes: No pathologically enlarged mediastinal or hilar lymph nodes. Esophagus is unremarkable in appearance. No axillary lymphadenopathy. Lungs/Pleura: There continues to be extensive ground-glass attenuation and pleuroparenchymal architectural distortion in the apex of the right upper lobe related to prior radiation therapy. The largest nodular area in this region has increased in size, currently measuring 1.6 x 2.0 x 1.4 cm (axial image 28 of series 7 and coronal image 79 of series 5). No other definite suspicious appearing pulmonary nodules or masses are noted elsewhere in the lungs. No acute consolidative airspace disease. No pleural effusions. Musculoskeletal: There are no aggressive appearing lytic or blastic lesions noted in the visualized portions of the skeleton. CT ABDOMEN PELVIS FINDINGS Hepatobiliary: Wedge-shaped area of low attenuation and/or hypoperfusion in segment 4B of the liver adjacent to the falciform ligament, strongly favored to represent a benign perfusion anomaly. No other definite suspicious appearing hepatic lesions. No intra or extrahepatic biliary ductal dilatation. Gallbladder is normal in appearance. Pancreas: No pancreatic mass. No pancreatic ductal dilatation. No pancreatic or peripancreatic fluid or inflammatory changes. Spleen: Unremarkable. Adrenals/Urinary Tract: Bilateral kidneys and bilateral adrenal glands are normal in appearance. No hydroureteronephrosis. Urinary bladder is normal in appearance. Stomach/Bowel: Normal appearance of the stomach. No pathologic dilatation of small bowel or colon. Appendix is not confidently identified may be surgically absent. Vascular/Lymphatic: Aortic atherosclerosis, without evidence of aneurysm or  dissection noted in the abdominal or pelvic vasculature. No lymphadenopathy noted in the abdomen or pelvis. Reproductive: Status post hysterectomy. Ovaries are not confidently identified may be surgically absent or atrophic. Other: There continues to be haziness in the small bowel mesentery, best appreciated on axial image 76 of series 2, which has progressively worsened over the prior 2 examinations. Similar worsening haziness is also noted in the sigmoid mesocolon (axial image 99 of series 2). As well, nodular thickening and surrounding haziness is noted along the anterior surface of the right iliacus musculature and in the adjacent retroperitoneal fat best appreciated on axial image 83 of series 2. These findings are all concerning for sites of metastatic disease both in the mesentery, sigmoid mesocolon and right retroperitoneum. No significant volume of ascites. No pneumoperitoneum. Musculoskeletal: Chronic compression fracture of L1 with 25% loss of anterior vertebral body height (unchanged). There are no aggressive appearing lytic or blastic lesions noted in the visualized portions of the skeleton. IMPRESSION: 1. Worsening areas of soft tissue haziness in the small bowel mesentery, sigmoid mesocolon, as well as nodularity and soft tissue haziness in the right hemipelvis in the retroperitoneum, all of which are unusual in appearance but concerning for sites of progressive metastatic disease. 2. Slight increased nodularity in the periphery of the right upper lobe near the apex. This may simply reflect progressive postradiation mass-like fibrosis, however, the possibility of residual/recurrent disease in this area is not excluded, and close attention on follow-up studies is recommended. 3. Aortic atherosclerosis, in addition to left anterior descending coronary artery disease. Please note that although the presence of coronary artery calcium documents the presence of coronary artery disease, the severity of this  disease and any potential stenosis cannot be assessed on this non-gated CT examination. Assessment for potential risk factor modification, dietary therapy or pharmacologic therapy may be warranted, if clinically indicated. 4. Additional incidental findings, as above. Aortic Atherosclerosis (ICD10-I70.0). Electronically Signed   By: Vinnie Langton M.D.   On: 06/09/2018 15:13    ASSESSMENT AND PLAN:  This is a very pleasant 57 years old white female with extensive stage small cell lung cancer status post systemic chemotherapy with carbo platinum and etoposide for 6 cycles and the patient rotated her treatment well except for chemotherapy-induced anemia and requirement for PRBCs and platelet transfusion. She had significant improvement in her disease with the chemotherapy. She also had prophylactic cranial irradiation. She also underwent stereotactic radiotherapy to progressive right upper lobe pulmonary nodule. The patient has been in observation for close to 2 years. Repeat CT scan of the chest, abdomen and pelvis performed recently showed evidence for disease progression in the abdomen. I personally and independently reviewed the scan images and discussed the results with the patient and her husband. I discussed with the patient her treatment options including palliative care and continuous observation versus consideration of resuming systemic chemotherapy with carboplatin for AUC of 5 on day 1, etoposide 100 mg/M2 on days 1, 2 and 3 with Tecentriq (Atezolizumab) 1200 mg IV every 3 weeks with Neulasta support.  The patient is interested in resuming her treatment with systemic chemotherapy.  I reminded her with the adverse effect of this treatment. She will receive flu vaccine today. I will send prescription for Compazine 10 mg p.o. every 6 hours as needed for nausea to her pharmacy. She is expected to start the first dose of this treatment on June 22, 2018 and she will come back for follow-up visit  1 week after the first cycle for management of any adverse effect of her treatment. The patient was advised to call immediately if she has any concerning symptoms in the interval. The patient voices understanding of current disease status and treatment options and is in agreement with the current care plan. All questions were answered. The patient knows to call the clinic with any problems, questions or concerns. We can certainly see the patient much sooner if necessary.  Disclaimer: This note was dictated with voice recognition software. Similar sounding words can inadvertently be transcribed and may not be corrected upon review.

## 2018-06-11 NOTE — Progress Notes (Signed)
ON [facility] - Small Cell Lung  No Change  Continue With Treatment as Ordered.     A cycle is every 21 days:     Etoposide        Dose Mod: None     Carboplatin        Dose Mod: None  **Always confirm dose/schedule in your pharmacy ordering system**  Patient Characteristics: Extensive Stage, First Line Stage Grouping: Extensive AJCC M Stage: 1 AJCC N Stage: 3 AJCC T Stage: 2 Line of therapy: First Line  Intent of Therapy: Non-Curative / Palliative Intent, Discussed with Patient

## 2018-06-11 NOTE — Telephone Encounter (Signed)
Scheduled appt per 10/10 los - gave patient AVS and calender per los.

## 2018-06-22 ENCOUNTER — Inpatient Hospital Stay: Payer: Medicare Other

## 2018-06-22 ENCOUNTER — Other Ambulatory Visit: Payer: Self-pay | Admitting: Internal Medicine

## 2018-06-22 VITALS — BP 126/84 | HR 86 | Temp 98.1°F | Resp 17 | Ht 63.0 in | Wt 153.0 lb

## 2018-06-22 DIAGNOSIS — C3491 Malignant neoplasm of unspecified part of right bronchus or lung: Secondary | ICD-10-CM

## 2018-06-22 DIAGNOSIS — Z5111 Encounter for antineoplastic chemotherapy: Secondary | ICD-10-CM | POA: Diagnosis not present

## 2018-06-22 DIAGNOSIS — R5382 Chronic fatigue, unspecified: Secondary | ICD-10-CM

## 2018-06-22 LAB — CBC WITH DIFFERENTIAL (CANCER CENTER ONLY)
Abs Immature Granulocytes: 0.04 10*3/uL (ref 0.00–0.07)
Basophils Absolute: 0 10*3/uL (ref 0.0–0.1)
Basophils Relative: 1 %
EOS ABS: 0.1 10*3/uL (ref 0.0–0.5)
EOS PCT: 2 %
HEMATOCRIT: 37.7 % (ref 36.0–46.0)
Hemoglobin: 12.8 g/dL (ref 12.0–15.0)
Immature Granulocytes: 1 %
LYMPHS PCT: 17 %
Lymphs Abs: 1.4 10*3/uL (ref 0.7–4.0)
MCH: 34.9 pg — AB (ref 26.0–34.0)
MCHC: 34 g/dL (ref 30.0–36.0)
MCV: 102.7 fL — AB (ref 80.0–100.0)
MONO ABS: 0.6 10*3/uL (ref 0.1–1.0)
MONOS PCT: 8 %
Neutro Abs: 5.8 10*3/uL (ref 1.7–7.7)
Neutrophils Relative %: 71 %
Platelet Count: 251 10*3/uL (ref 150–400)
RBC: 3.67 MIL/uL — ABNORMAL LOW (ref 3.87–5.11)
RDW: 13.2 % (ref 11.5–15.5)
WBC Count: 8 10*3/uL (ref 4.0–10.5)
nRBC: 0 % (ref 0.0–0.2)

## 2018-06-22 LAB — CMP (CANCER CENTER ONLY)
ALBUMIN: 3.1 g/dL — AB (ref 3.5–5.0)
ALT: 19 U/L (ref 0–44)
ANION GAP: 12 (ref 5–15)
AST: 18 U/L (ref 15–41)
Alkaline Phosphatase: 118 U/L (ref 38–126)
BUN: 4 mg/dL — ABNORMAL LOW (ref 6–20)
CHLORIDE: 104 mmol/L (ref 98–111)
CO2: 25 mmol/L (ref 22–32)
Calcium: 8.9 mg/dL (ref 8.9–10.3)
Creatinine: 0.82 mg/dL (ref 0.44–1.00)
GFR, Estimated: >60 mL/min (ref >60)
GLUCOSE: 97 mg/dL (ref 70–99)
POTASSIUM: 3 mmol/L — AB (ref 3.5–5.1)
Sodium: 141 mmol/L (ref 135–145)
Total Bilirubin: 0.3 mg/dL (ref 0.3–1.2)
Total Protein: 6.8 g/dL (ref 6.5–8.1)

## 2018-06-22 LAB — TSH: TSH: 1.563 u[IU]/mL (ref 0.308–3.960)

## 2018-06-22 MED ORDER — DIPHENHYDRAMINE HCL 50 MG/ML IJ SOLN
25.0000 mg | Freq: Once | INTRAMUSCULAR | Status: AC
  Administered 2018-06-22: 25 mg via INTRAVENOUS

## 2018-06-22 MED ORDER — SODIUM CHLORIDE 0.9 % IV SOLN
100.0000 mg/m2 | Freq: Once | INTRAVENOUS | Status: AC
  Administered 2018-06-22: 180 mg via INTRAVENOUS
  Filled 2018-06-22: qty 9

## 2018-06-22 MED ORDER — PALONOSETRON HCL INJECTION 0.25 MG/5ML
INTRAVENOUS | Status: AC
  Filled 2018-06-22: qty 5

## 2018-06-22 MED ORDER — SODIUM CHLORIDE 0.9 % IV SOLN
Freq: Once | INTRAVENOUS | Status: AC
  Administered 2018-06-22: 09:00:00 via INTRAVENOUS
  Filled 2018-06-22: qty 250

## 2018-06-22 MED ORDER — SODIUM CHLORIDE 0.9 % IV SOLN
506.0000 mg | Freq: Once | INTRAVENOUS | Status: AC
  Administered 2018-06-22: 510 mg via INTRAVENOUS
  Filled 2018-06-22: qty 51

## 2018-06-22 MED ORDER — ALTEPLASE 2 MG IJ SOLR
INTRAMUSCULAR | Status: AC
  Filled 2018-06-22: qty 2

## 2018-06-22 MED ORDER — FAMOTIDINE IN NACL 20-0.9 MG/50ML-% IV SOLN
20.0000 mg | Freq: Once | INTRAVENOUS | Status: AC
  Administered 2018-06-22: 20 mg via INTRAVENOUS

## 2018-06-22 MED ORDER — SODIUM CHLORIDE 0.9 % IV SOLN
Freq: Once | INTRAVENOUS | Status: AC
  Administered 2018-06-22: 10:00:00 via INTRAVENOUS
  Filled 2018-06-22: qty 5

## 2018-06-22 MED ORDER — SODIUM CHLORIDE 0.9 % IV SOLN
1200.0000 mg | Freq: Once | INTRAVENOUS | Status: AC
  Administered 2018-06-22: 1200 mg via INTRAVENOUS
  Filled 2018-06-22: qty 20

## 2018-06-22 MED ORDER — ALTEPLASE 2 MG IJ SOLR
2.0000 mg | Freq: Once | INTRAMUSCULAR | Status: AC | PRN
  Administered 2018-06-22: 2 mg
  Filled 2018-06-22: qty 2

## 2018-06-22 MED ORDER — HEPARIN SOD (PORK) LOCK FLUSH 100 UNIT/ML IV SOLN
500.0000 [IU] | Freq: Once | INTRAVENOUS | Status: DC | PRN
  Filled 2018-06-22: qty 5

## 2018-06-22 MED ORDER — POTASSIUM CHLORIDE ER 20 MEQ PO TBCR: 10.0000 meq | EXTENDED_RELEASE_TABLET | Freq: Every day | ORAL | 0 refills | Status: DC

## 2018-06-22 MED ORDER — SODIUM CHLORIDE 0.9% FLUSH
10.0000 mL | INTRAVENOUS | Status: DC | PRN
  Filled 2018-06-22: qty 10

## 2018-06-22 MED ORDER — DIPHENHYDRAMINE HCL 50 MG/ML IJ SOLN
INTRAMUSCULAR | Status: AC
  Filled 2018-06-22: qty 1

## 2018-06-22 MED ORDER — PALONOSETRON HCL INJECTION 0.25 MG/5ML
0.2500 mg | Freq: Once | INTRAVENOUS | Status: AC
  Administered 2018-06-22: 0.25 mg via INTRAVENOUS

## 2018-06-22 MED ORDER — FAMOTIDINE IN NACL 20-0.9 MG/50ML-% IV SOLN
INTRAVENOUS | Status: AC
  Filled 2018-06-22: qty 50

## 2018-06-22 NOTE — Patient Instructions (Signed)

## 2018-06-22 NOTE — Patient Instructions (Signed)
[facility] Discharge Instructions for Patients Receiving Chemotherapy  Today you received the following chemotherapy agents Tecentriq, Carboplatin, Etoposide.  To help prevent nausea and vomiting after your treatment, we encourage you to take your nausea medication. DO NOT TAKE ZOFRAN FOR THREE DAYS AFTER TREATMENT.   If you develop nausea and vomiting that is not controlled by your nausea medication, call the clinic.   BELOW ARE SYMPTOMS THAT SHOULD BE REPORTED IMMEDIATELY:  *FEVER GREATER THAN 100.5 F  *CHILLS WITH OR WITHOUT FEVER  NAUSEA AND VOMITING THAT IS NOT CONTROLLED WITH YOUR NAUSEA MEDICATION  *UNUSUAL SHORTNESS OF BREATH  *UNUSUAL BRUISING OR BLEEDING  TENDERNESS IN MOUTH AND THROAT WITH OR WITHOUT PRESENCE OF ULCERS  *URINARY PROBLEMS  *BOWEL PROBLEMS  UNUSUAL RASH Items with * indicate a potential emergency and should be followed up as soon as possible.  Feel free to call the clinic should you have any questions or concerns. The clinic phone number is [phone].  Please show the [place] at check-in to the Emergency Department and triage nurse.

## 2018-06-22 NOTE — Progress Notes (Signed)
Unable to get blood return from [facility] after several rounds of port aerobics. CathFlow instilled at 08:24. Lab called to draw pt's labs peripherally so as not to delay treatment. Pt and husband updated on plan and agreeable. Will continue to monitor  At 09:00 PAC reassessed and blood return noted immediately. 5 mLs of bright red blood removed without issue. Pt hooked up to normal saline and informed that CMP still needed to result before treatment could be released. Food and drink provided, and pt declined any other needs/concerns at the time.

## 2018-06-23 ENCOUNTER — Inpatient Hospital Stay: Payer: Medicare Other

## 2018-06-23 ENCOUNTER — Telehealth: Payer: Self-pay

## 2018-06-23 VITALS — BP 139/75 | HR 95 | Temp 98.5°F | Resp 16 | Ht 63.0 in | Wt 154.0 lb

## 2018-06-23 DIAGNOSIS — C3491 Malignant neoplasm of unspecified part of right bronchus or lung: Secondary | ICD-10-CM

## 2018-06-23 DIAGNOSIS — Z5111 Encounter for antineoplastic chemotherapy: Secondary | ICD-10-CM | POA: Diagnosis not present

## 2018-06-23 MED ORDER — DEXAMETHASONE SODIUM PHOSPHATE 10 MG/ML IJ SOLN
INTRAMUSCULAR | Status: AC
  Filled 2018-06-23: qty 1

## 2018-06-23 MED ORDER — SODIUM CHLORIDE 0.9% FLUSH
10.0000 mL | INTRAVENOUS | Status: DC | PRN
  Administered 2018-06-23: 10 mL
  Filled 2018-06-23: qty 10

## 2018-06-23 MED ORDER — SODIUM CHLORIDE 0.9 % IV SOLN: 10.0000 mg | Freq: Once | INTRAVENOUS | Status: DC

## 2018-06-23 MED ORDER — DEXAMETHASONE SODIUM PHOSPHATE 10 MG/ML IJ SOLN
10.0000 mg | Freq: Once | INTRAMUSCULAR | Status: AC
  Administered 2018-06-23: 10 mg via INTRAVENOUS

## 2018-06-23 MED ORDER — SODIUM CHLORIDE 0.9 % IV SOLN
100.0000 mg/m2 | Freq: Once | INTRAVENOUS | Status: AC
  Administered 2018-06-23: 180 mg via INTRAVENOUS
  Filled 2018-06-23: qty 9

## 2018-06-23 MED ORDER — SODIUM CHLORIDE 0.9 % IV SOLN
Freq: Once | INTRAVENOUS | Status: AC
  Administered 2018-06-23: 10:00:00 via INTRAVENOUS
  Filled 2018-06-23: qty 250

## 2018-06-23 MED ORDER — HEPARIN SOD (PORK) LOCK FLUSH 100 UNIT/ML IV SOLN
500.0000 [IU] | Freq: Once | INTRAVENOUS | Status: AC | PRN
  Administered 2018-06-23: 500 [IU]
  Filled 2018-06-23: qty 5

## 2018-06-23 NOTE — Patient Instructions (Signed)
[place] Discharge Instructions for Patients Receiving Chemotherapy  Today you received the following chemotherapy agents Etoposide (Vepeside).  To help prevent nausea and vomiting after your treatment, we encourage you to take your nausea medication as prescribed.   If you develop nausea and vomiting that is not controlled by your nausea medication, call the clinic.   BELOW ARE SYMPTOMS THAT SHOULD BE REPORTED IMMEDIATELY:  *FEVER GREATER THAN 100.5 F  *CHILLS WITH OR WITHOUT FEVER  NAUSEA AND VOMITING THAT IS NOT CONTROLLED WITH YOUR NAUSEA MEDICATION  *UNUSUAL SHORTNESS OF BREATH  *UNUSUAL BRUISING OR BLEEDING  TENDERNESS IN MOUTH AND THROAT WITH OR WITHOUT PRESENCE OF ULCERS  *URINARY PROBLEMS  *BOWEL PROBLEMS  UNUSUAL RASH Items with * indicate a potential emergency and should be followed up as soon as possible.  Feel free to call the clinic should you have any questions or concerns. The clinic phone number is [phone].  Please show the [place] at check-in to the Emergency Department and triage nurse.

## 2018-06-23 NOTE — Telephone Encounter (Signed)
Per patient request to move appointment to a later time. She stated she had tom take her brother as MD appointment on that same day. She is aware that even if she come early she might don't be seen until her appointed time. Per 10/22 walk in

## 2018-06-24 ENCOUNTER — Inpatient Hospital Stay: Payer: Medicare Other

## 2018-06-24 VITALS — BP 115/81 | HR 75 | Temp 97.8°F | Resp 16

## 2018-06-24 DIAGNOSIS — C3491 Malignant neoplasm of unspecified part of right bronchus or lung: Secondary | ICD-10-CM

## 2018-06-24 DIAGNOSIS — Z5111 Encounter for antineoplastic chemotherapy: Secondary | ICD-10-CM | POA: Diagnosis not present

## 2018-06-24 MED ORDER — DEXAMETHASONE SODIUM PHOSPHATE 10 MG/ML IJ SOLN
10.0000 mg | Freq: Once | INTRAMUSCULAR | Status: AC
  Administered 2018-06-24: 10 mg via INTRAVENOUS

## 2018-06-24 MED ORDER — SODIUM CHLORIDE 0.9 % IV SOLN
Freq: Once | INTRAVENOUS | Status: AC
  Administered 2018-06-24: 09:00:00 via INTRAVENOUS
  Filled 2018-06-24: qty 250

## 2018-06-24 MED ORDER — SODIUM CHLORIDE 0.9 % IV SOLN
100.0000 mg/m2 | Freq: Once | INTRAVENOUS | Status: AC
  Administered 2018-06-24: 180 mg via INTRAVENOUS
  Filled 2018-06-24: qty 9

## 2018-06-24 MED ORDER — DEXAMETHASONE SODIUM PHOSPHATE 10 MG/ML IJ SOLN
INTRAMUSCULAR | Status: AC
  Filled 2018-06-24: qty 1

## 2018-06-24 MED ORDER — HEPARIN SOD (PORK) LOCK FLUSH 100 UNIT/ML IV SOLN
500.0000 [IU] | Freq: Once | INTRAVENOUS | Status: AC | PRN
  Administered 2018-06-24: 500 [IU]
  Filled 2018-06-24: qty 5

## 2018-06-24 MED ORDER — SODIUM CHLORIDE 0.9% FLUSH
10.0000 mL | INTRAVENOUS | Status: DC | PRN
  Administered 2018-06-24: 10 mL
  Filled 2018-06-24: qty 10

## 2018-06-24 NOTE — Patient Instructions (Signed)
[place] Discharge Instructions for Patients Receiving Chemotherapy  Today you received the following chemotherapy agents Etoposide (Vepeside).  To help prevent nausea and vomiting after your treatment, we encourage you to take your nausea medication as prescribed.   If you develop nausea and vomiting that is not controlled by your nausea medication, call the clinic.   BELOW ARE SYMPTOMS THAT SHOULD BE REPORTED IMMEDIATELY:  *FEVER GREATER THAN 100.5 F  *CHILLS WITH OR WITHOUT FEVER  NAUSEA AND VOMITING THAT IS NOT CONTROLLED WITH YOUR NAUSEA MEDICATION  *UNUSUAL SHORTNESS OF BREATH  *UNUSUAL BRUISING OR BLEEDING  TENDERNESS IN MOUTH AND THROAT WITH OR WITHOUT PRESENCE OF ULCERS  *URINARY PROBLEMS  *BOWEL PROBLEMS  UNUSUAL RASH Items with * indicate a potential emergency and should be followed up as soon as possible.  Feel free to call the clinic should you have any questions or concerns. The clinic phone number is [phone].  Please show the [place] at check-in to the Emergency Department and triage nurse.

## 2018-06-26 ENCOUNTER — Inpatient Hospital Stay: Payer: Medicare Other

## 2018-06-26 DIAGNOSIS — C3491 Malignant neoplasm of unspecified part of right bronchus or lung: Secondary | ICD-10-CM

## 2018-06-26 DIAGNOSIS — Z5111 Encounter for antineoplastic chemotherapy: Secondary | ICD-10-CM | POA: Diagnosis not present

## 2018-06-26 MED ORDER — PEGFILGRASTIM-CBQV 6 MG/0.6ML ~~LOC~~ SOSY
PREFILLED_SYRINGE | SUBCUTANEOUS | Status: AC
  Filled 2018-06-26: qty 0.6

## 2018-06-26 MED ORDER — PEGFILGRASTIM-CBQV 6 MG/0.6ML ~~LOC~~ SOSY
6.0000 mg | PREFILLED_SYRINGE | Freq: Once | SUBCUTANEOUS | Status: AC
  Administered 2018-06-26: 6 mg via SUBCUTANEOUS

## 2018-06-29 ENCOUNTER — Inpatient Hospital Stay (HOSPITAL_BASED_OUTPATIENT_CLINIC_OR_DEPARTMENT_OTHER): Payer: Medicare Other | Admitting: Internal Medicine

## 2018-06-29 ENCOUNTER — Encounter: Payer: Self-pay | Admitting: Internal Medicine

## 2018-06-29 ENCOUNTER — Telehealth: Payer: Self-pay | Admitting: Internal Medicine

## 2018-06-29 ENCOUNTER — Other Ambulatory Visit: Payer: Self-pay | Admitting: Medical Oncology

## 2018-06-29 ENCOUNTER — Inpatient Hospital Stay: Payer: Medicare Other

## 2018-06-29 VITALS — BP 106/88 | HR 102 | Temp 98.4°F | Resp 18 | Ht 63.0 in | Wt 146.9 lb

## 2018-06-29 DIAGNOSIS — C3411 Malignant neoplasm of upper lobe, right bronchus or lung: Secondary | ICD-10-CM

## 2018-06-29 DIAGNOSIS — I2510 Atherosclerotic heart disease of native coronary artery without angina pectoris: Secondary | ICD-10-CM

## 2018-06-29 DIAGNOSIS — Z23 Encounter for immunization: Secondary | ICD-10-CM | POA: Diagnosis not present

## 2018-06-29 DIAGNOSIS — E860 Dehydration: Secondary | ICD-10-CM

## 2018-06-29 DIAGNOSIS — F329 Major depressive disorder, single episode, unspecified: Secondary | ICD-10-CM

## 2018-06-29 DIAGNOSIS — Z85828 Personal history of other malignant neoplasm of skin: Secondary | ICD-10-CM

## 2018-06-29 DIAGNOSIS — Z5112 Encounter for antineoplastic immunotherapy: Secondary | ICD-10-CM

## 2018-06-29 DIAGNOSIS — C3491 Malignant neoplasm of unspecified part of right bronchus or lung: Secondary | ICD-10-CM

## 2018-06-29 DIAGNOSIS — C778 Secondary and unspecified malignant neoplasm of lymph nodes of multiple regions: Secondary | ICD-10-CM

## 2018-06-29 DIAGNOSIS — Z5111 Encounter for antineoplastic chemotherapy: Secondary | ICD-10-CM | POA: Diagnosis not present

## 2018-06-29 DIAGNOSIS — I10 Essential (primary) hypertension: Secondary | ICD-10-CM

## 2018-06-29 DIAGNOSIS — K121 Other forms of stomatitis: Secondary | ICD-10-CM

## 2018-06-29 DIAGNOSIS — I700 Atherosclerosis of aorta: Secondary | ICD-10-CM

## 2018-06-29 DIAGNOSIS — E785 Hyperlipidemia, unspecified: Secondary | ICD-10-CM

## 2018-06-29 DIAGNOSIS — M810 Age-related osteoporosis without current pathological fracture: Secondary | ICD-10-CM

## 2018-06-29 DIAGNOSIS — C7989 Secondary malignant neoplasm of other specified sites: Secondary | ICD-10-CM

## 2018-06-29 DIAGNOSIS — G894 Chronic pain syndrome: Secondary | ICD-10-CM

## 2018-06-29 LAB — CMP (CANCER CENTER ONLY)
ALT: 51 U/L — ABNORMAL HIGH (ref 0–44)
AST: 30 U/L (ref 15–41)
Albumin: 3.6 g/dL (ref 3.5–5.0)
Alkaline Phosphatase: 135 U/L — ABNORMAL HIGH (ref 38–126)
Anion gap: 10 (ref 5–15)
BUN: 15 mg/dL (ref 6–20)
CHLORIDE: 97 mmol/L — AB (ref 98–111)
CO2: 27 mmol/L (ref 22–32)
Calcium: 9.3 mg/dL (ref 8.9–10.3)
Creatinine: 0.83 mg/dL (ref 0.44–1.00)
GFR, Est AFR Am: >60 mL/min (ref >60)
Glucose, Bld: 123 mg/dL — ABNORMAL HIGH (ref 70–99)
POTASSIUM: 3.2 mmol/L — AB (ref 3.5–5.1)
SODIUM: 134 mmol/L — AB (ref 135–145)
Total Bilirubin: 1.4 mg/dL — ABNORMAL HIGH (ref 0.3–1.2)
Total Protein: 7.3 g/dL (ref 6.5–8.1)

## 2018-06-29 LAB — CBC WITH DIFFERENTIAL (CANCER CENTER ONLY)
ABS IMMATURE GRANULOCYTES: 0.08 10*3/uL — AB (ref 0.00–0.07)
BASOS PCT: 0 %
Basophils Absolute: 0 10*3/uL (ref 0.0–0.1)
Eosinophils Absolute: 0 10*3/uL (ref 0.0–0.5)
Eosinophils Relative: 2 %
HCT: 38.3 % (ref 36.0–46.0)
HEMOGLOBIN: 13.1 g/dL (ref 12.0–15.0)
IMMATURE GRANULOCYTES: 7 %
LYMPHS PCT: 60 %
Lymphs Abs: 0.7 10*3/uL (ref 0.7–4.0)
MCH: 34.6 pg — AB (ref 26.0–34.0)
MCHC: 34.2 g/dL (ref 30.0–36.0)
MCV: 101.1 fL — AB (ref 80.0–100.0)
MONO ABS: 0 10*3/uL — AB (ref 0.1–1.0)
MONOS PCT: 3 %
NEUTROS ABS: 0.3 10*3/uL — AB (ref 1.7–7.7)
NEUTROS PCT: 28 %
PLATELETS: 88 10*3/uL — AB (ref 150–400)
RBC: 3.79 MIL/uL — ABNORMAL LOW (ref 3.87–5.11)
RDW: 12.5 % (ref 11.5–15.5)
WBC Count: 1.2 10*3/uL — ABNORMAL LOW (ref 4.0–10.5)
nRBC: 0 % (ref 0.0–0.2)

## 2018-06-29 MED ORDER — HEPARIN SOD (PORK) LOCK FLUSH 100 UNIT/ML IV SOLN
500.0000 [IU] | Freq: Once | INTRAVENOUS | Status: AC | PRN
  Administered 2018-06-29: 500 [IU]
  Filled 2018-06-29: qty 5

## 2018-06-29 MED ORDER — SODIUM CHLORIDE 0.9 % IV SOLN
750.0000 mL | Freq: Once | INTRAVENOUS | Status: DC
  Filled 2018-06-29: qty 750

## 2018-06-29 MED ORDER — SODIUM CHLORIDE 0.9% FLUSH
10.0000 mL | INTRAVENOUS | Status: DC | PRN
  Administered 2018-06-29: 10 mL
  Filled 2018-06-29: qty 10

## 2018-06-29 MED ORDER — HEPARIN SOD (PORK) LOCK FLUSH 100 UNIT/ML IV SOLN
500.0000 [IU] | Freq: Once | INTRAVENOUS | Status: DC | PRN
  Filled 2018-06-29: qty 5

## 2018-06-29 MED ORDER — SODIUM CHLORIDE 0.9 % IV SOLN
INTRAVENOUS | Status: DC
  Administered 2018-06-29: 12:00:00 via INTRAVENOUS
  Filled 2018-06-29 (×2): qty 250

## 2018-06-29 MED ORDER — MAGIC MOUTHWASH: 5.0000 mL | Freq: Four times a day (QID) | ORAL | 0 refills | Status: DC

## 2018-06-29 MED FILL — CMPD-MMW-NYST/GERI/DIPHEN: 12 days supply | Qty: 240 | Fill #0

## 2018-06-29 NOTE — Addendum Note (Signed)
Addended by: Ardeen Garland on: 06/29/2018 12:33 PM   Modules accepted: Orders

## 2018-06-29 NOTE — Telephone Encounter (Signed)
Scheduled appt per 10/28 los- pt to get an updated schedule next visit.

## 2018-06-29 NOTE — Progress Notes (Signed)
[place] [phone]   [phone]  OFFICE PROGRESS NOTE  [name], MD [address] [place] [address]  DIAGNOSIS: Extensive stage (T2a, N3, [name], large right anterior mediastinal and supraclavicular lymphadenopathy as well as pancreatic and splenic metastasis diagnosed in September 2017.  The patient had disease progression in October 2019.  PRIOR THERAPY:  1) Systemic chemotherapy was carboplatin for AUC of 5 on day 1 and etoposide 100 MG/M2 on days 1, 2 and 3 with Neulasta support. Status post 6 cycles with significant response of her disease. 2) Prophylactic cranial irradiation under the care of Dr. Sondra Come on 12/02/2016. 3) stereotactic radiotherapy to the recurrent right upper lobe pulmonary nodule under the care of Dr. Sondra Come completed November 10, 2017.  CURRENT THERAPY: Rate treatment with systemic chemotherapy with carboplatin for AUC of 5 on day 1 and etoposide 100 mg/M2 on days 1, 2 and 3 as well as Tecentriq (Atezolizumab) 1200 mg IV every 3 weeks with Neulasta support.  First dose June 22, 2018 for disease recurrence.  Status post 1 cycle.  INTERVAL HISTORY: MENUCHA Kidd 57 y.o. female returns to the clinic today for follow-up visit accompanied by her husband.  The patient is complaining of increasing fatigue and weakness as well as frequent episodes of diarrhea.  She is using Imodium with some help.  She denied having any chest pain but has shortness of breath with exertion with no cough or hemoptysis.  She denied having any recent weight loss or night sweats.  She continues to complain of gait imbalance and confusion.  She is here today for evaluation and repeat blood work.  MEDICAL HISTORY: Past Medical History:  Diagnosis Date  . 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 Blood type, Rh positive   . Cancer ([place])   . Chronic pain syndrome   .  Depression 08/07/2016  . Encounter for antineoplastic chemotherapy 07/17/2016  . History of external beam radiation therapy 11/19/16-12/02/16   brain 25 Gy in 10 fractions  . Hyperlipidemia   . Hypertension   . Hypertension 08/07/2016  . Osteoporosis     ALLERGIES:  is allergic to codeine; motrin [ibuprofen]; thiazide-type diuretics; and vicodin [hydrocodone-acetaminophen].  MEDICATIONS:  Current Outpatient Medications  Medication Sig Dispense Refill  . celecoxib (CELEBREX) 200 MG capsule Take 200 mg by mouth daily.    Marland Kitchen lidocaine-prilocaine (EMLA) cream Apply 1 application topically as needed. 30 g 0  . potassium chloride 20 MEQ TBCR Take 10 mEq by mouth daily. 7 tablet 0  . traMADol (ULTRAM) 50 MG tablet Take by mouth every 6 (six) hours as needed.    . diazepam (VALIUM) 5 MG tablet Take 5 mg by mouth 4 (four) times daily as needed.  2  . diphenhydrAMINE (BENADRYL) 25 MG tablet Take 25 mg by mouth every 6 (six) hours as needed.    . prochlorperazine (COMPAZINE) 10 MG tablet Take 1 tablet (10 mg total) by mouth every 6 (six) hours as needed for nausea or vomiting. (Patient not taking: Reported on 06/29/2018) 30 tablet 0   No current facility-administered medications for this visit.     SURGICAL HISTORY:  Past Surgical History:  Procedure Laterality Date  . ABDOMINAL HYSTERECTOMY  1981  . APPENDECTOMY     at age 19  . EYE SURGERY     left  . IR  GENERIC HISTORICAL  06/03/2016   IR FLUORO GUIDE PORT INSERTION RIGHT 06/03/2016 [facility]INTERV RAD  . IR GENERIC HISTORICAL  06/03/2016   IR US GUIDE VASC ACCESS RIGHT 06/03/2016 [facility]  . SKIN CANCER EXCISION     basal cell carcinoma L side of face    REVIEW OF SYSTEMS:  A comprehensive review of systems was negative except for: Constitutional: positive for fatigue Respiratory: positive for dyspnea on exertion Musculoskeletal: positive for muscle weakness Neurological: positive for gait problems and weakness   PHYSICAL EXAMINATION:  General appearance: alert, cooperative, fatigued and no distress Head: Normocephalic, without obvious abnormality, atraumatic Neck: no adenopathy, no JVD, supple, symmetrical, trachea midline and thyroid not enlarged, symmetric, no tenderness/mass/nodules Lymph nodes: Cervical, supraclavicular, and axillary nodes normal. Resp: clear to auscultation bilaterally Back: symmetric, no curvature. ROM normal. No CVA tenderness. Cardio: regular rate and rhythm, S1, S2 normal, no murmur, click, rub or gallop GI: soft, non-tender; bowel sounds normal; no masses,  no organomegaly Extremities: extremities normal, atraumatic, no cyanosis or edema  ECOG PERFORMANCE STATUS: 1 - Symptomatic but completely ambulatory  Blood pressure 106/88, pulse (!) 102, temperature 98.4 F (36.9 C), temperature source Oral, resp. rate 18, height 5\' 3"  (1.6 m), weight 146 lb 14.4 oz (66.6 kg), SpO2 99 %.  LABORATORY DATA: Lab Results  Component Value Date   WBC 1.2 (L) 06/29/2018   HGB 13.1 06/29/2018   HCT 38.3 06/29/2018   MCV 101.1 (H) 06/29/2018   PLT 88 (L) 06/29/2018      Chemistry      Component Value Date/Time   NA 141 06/22/2018 0739   NA 141 08/01/2017 0835   K 3.0 (LL) 06/22/2018 0739   K 3.5 08/01/2017 0835   CL 104 06/22/2018 0739   CO2 25 06/22/2018 0739   CO2 25 08/01/2017 0835   BUN 4 (L) 06/22/2018 0739   BUN 8.0 08/01/2017 0835   CREATININE 0.82 06/22/2018 0739   CREATININE 0.9 08/01/2017 0835      Component Value Date/Time   CALCIUM 8.9 06/22/2018 0739   CALCIUM 9.9 08/01/2017 0835   ALKPHOS 118 06/22/2018 0739   ALKPHOS 142 08/01/2017 0835   AST 18 06/22/2018 0739   AST 15 08/01/2017 0835   ALT 19 06/22/2018 0739   ALT 19 08/01/2017 0835   BILITOT 0.3 06/22/2018 0739   BILITOT 0.35 08/01/2017 0835       RADIOGRAPHIC STUDIES: Ct Chest W Contrast  Result Date: 06/09/2018 CLINICAL DATA:  57 year old female with history of right-sided lung cancer diagnosed in 2017 status post  chemotherapy and radiation therapy which is now complete. Follow-up study. EXAM: CT CHEST, ABDOMEN, AND PELVIS WITH CONTRAST TECHNIQUE: Multidetector CT imaging of the chest, abdomen and pelvis was performed following the standard protocol during bolus administration of intravenous contrast. CONTRAST:  156mL OMNIPAQUE IOHEXOL 300 MG/ML  SOLN COMPARISON:  CT the chest, abdomen and pelvis 03/13/2018. FINDINGS: CT CHEST FINDINGS Cardiovascular: Heart size is normal. There is no significant pericardial fluid, thickening or pericardial calcification. There is aortic atherosclerosis, as well as atherosclerosis of the great vessels of the mediastinum and the coronary arteries, including calcified atherosclerotic plaque in the left anterior descending coronary artery. Right internal jugular single-lumen porta cath with tip terminating at the superior cavoatrial junction. Mediastinum/Nodes: No pathologically enlarged mediastinal or hilar lymph nodes. Esophagus is unremarkable in appearance. No axillary lymphadenopathy. Lungs/Pleura: There continues to be extensive ground-glass attenuation and pleuroparenchymal architectural distortion in the apex of the right upper lobe related to  prior radiation therapy. The largest nodular area in this region has increased in size, currently measuring 1.6 x 2.0 x 1.4 cm (axial image 28 of series 7 and coronal image 79 of series 5). No other definite suspicious appearing pulmonary nodules or masses are noted elsewhere in the lungs. No acute consolidative airspace disease. No pleural effusions. Musculoskeletal: There are no aggressive appearing lytic or blastic lesions noted in the visualized portions of the skeleton. CT ABDOMEN PELVIS FINDINGS Hepatobiliary: Wedge-shaped area of low attenuation and/or hypoperfusion in segment 4B of the liver adjacent to the falciform ligament, strongly favored to represent a benign perfusion anomaly. No other definite suspicious appearing hepatic lesions.  No intra or extrahepatic biliary ductal dilatation. Gallbladder is normal in appearance. Pancreas: No pancreatic mass. No pancreatic ductal dilatation. No pancreatic or peripancreatic fluid or inflammatory changes. Spleen: Unremarkable. Adrenals/Urinary Tract: Bilateral kidneys and bilateral adrenal glands are normal in appearance. No hydroureteronephrosis. Urinary bladder is normal in appearance. Stomach/Bowel: Normal appearance of the stomach. No pathologic dilatation of small bowel or colon. Appendix is not confidently identified may be surgically absent. Vascular/Lymphatic: Aortic atherosclerosis, without evidence of aneurysm or dissection noted in the abdominal or pelvic vasculature. No lymphadenopathy noted in the abdomen or pelvis. Reproductive: Status post hysterectomy. Ovaries are not confidently identified may be surgically absent or atrophic. Other: There continues to be haziness in the small bowel mesentery, best appreciated on axial image 76 of series 2, which has progressively worsened over the prior 2 examinations. Similar worsening haziness is also noted in the sigmoid mesocolon (axial image 99 of series 2). As well, nodular thickening and surrounding haziness is noted along the anterior surface of the right iliacus musculature and in the adjacent retroperitoneal fat best appreciated on axial image 83 of series 2. These findings are all concerning for sites of metastatic disease both in the mesentery, sigmoid mesocolon and right retroperitoneum. No significant volume of ascites. No pneumoperitoneum. Musculoskeletal: Chronic compression fracture of L1 with 25% loss of anterior vertebral body height (unchanged). There are no aggressive appearing lytic or blastic lesions noted in the visualized portions of the skeleton. IMPRESSION: 1. Worsening areas of soft tissue haziness in the small bowel mesentery, sigmoid mesocolon, as well as nodularity and soft tissue haziness in the right hemipelvis in the  retroperitoneum, all of which are unusual in appearance but concerning for sites of progressive metastatic disease. 2. Slight increased nodularity in the periphery of the right upper lobe near the apex. This may simply reflect progressive postradiation mass-like fibrosis, however, the possibility of residual/recurrent disease in this area is not excluded, and close attention on follow-up studies is recommended. 3. Aortic atherosclerosis, in addition to left anterior descending coronary artery disease. Please note that although the presence of coronary artery calcium documents the presence of coronary artery disease, the severity of this disease and any potential stenosis cannot be assessed on this non-gated CT examination. Assessment for potential risk factor modification, dietary therapy or pharmacologic therapy may be warranted, if clinically indicated. 4. Additional incidental findings, as above. Aortic Atherosclerosis (ICD10-I70.0). Electronically Signed   By: Vinnie Langton M.D.   On: 06/09/2018 15:13   Ct Abdomen Pelvis W Contrast  Result Date: 06/09/2018 CLINICAL DATA:  57 year old female with history of right-sided lung cancer diagnosed in 2017 status post chemotherapy and radiation therapy which is now complete. Follow-up study. EXAM: CT CHEST, ABDOMEN, AND PELVIS WITH CONTRAST TECHNIQUE: Multidetector CT imaging of the chest, abdomen and pelvis was performed following the standard protocol during  bolus administration of intravenous contrast. CONTRAST:  179mL OMNIPAQUE IOHEXOL 300 MG/ML  SOLN COMPARISON:  CT the chest, abdomen and pelvis 03/13/2018. FINDINGS: CT CHEST FINDINGS Cardiovascular: Heart size is normal. There is no significant pericardial fluid, thickening or pericardial calcification. There is aortic atherosclerosis, as well as atherosclerosis of the great vessels of the mediastinum and the coronary arteries, including calcified atherosclerotic plaque in the left anterior descending  coronary artery. Right internal jugular single-lumen porta cath with tip terminating at the superior cavoatrial junction. Mediastinum/Nodes: No pathologically enlarged mediastinal or hilar lymph nodes. Esophagus is unremarkable in appearance. No axillary lymphadenopathy. Lungs/Pleura: There continues to be extensive ground-glass attenuation and pleuroparenchymal architectural distortion in the apex of the right upper lobe related to prior radiation therapy. The largest nodular area in this region has increased in size, currently measuring 1.6 x 2.0 x 1.4 cm (axial image 28 of series 7 and coronal image 79 of series 5). No other definite suspicious appearing pulmonary nodules or masses are noted elsewhere in the lungs. No acute consolidative airspace disease. No pleural effusions. Musculoskeletal: There are no aggressive appearing lytic or blastic lesions noted in the visualized portions of the skeleton. CT ABDOMEN PELVIS FINDINGS Hepatobiliary: Wedge-shaped area of low attenuation and/or hypoperfusion in segment 4B of the liver adjacent to the falciform ligament, strongly favored to represent a benign perfusion anomaly. No other definite suspicious appearing hepatic lesions. No intra or extrahepatic biliary ductal dilatation. Gallbladder is normal in appearance. Pancreas: No pancreatic mass. No pancreatic ductal dilatation. No pancreatic or peripancreatic fluid or inflammatory changes. Spleen: Unremarkable. Adrenals/Urinary Tract: Bilateral kidneys and bilateral adrenal glands are normal in appearance. No hydroureteronephrosis. Urinary bladder is normal in appearance. Stomach/Bowel: Normal appearance of the stomach. No pathologic dilatation of small bowel or colon. Appendix is not confidently identified may be surgically absent. Vascular/Lymphatic: Aortic atherosclerosis, without evidence of aneurysm or dissection noted in the abdominal or pelvic vasculature. No lymphadenopathy noted in the abdomen or pelvis.  Reproductive: Status post hysterectomy. Ovaries are not confidently identified may be surgically absent or atrophic. Other: There continues to be haziness in the small bowel mesentery, best appreciated on axial image 76 of series 2, which has progressively worsened over the prior 2 examinations. Similar worsening haziness is also noted in the sigmoid mesocolon (axial image 99 of series 2). As well, nodular thickening and surrounding haziness is noted along the anterior surface of the right iliacus musculature and in the adjacent retroperitoneal fat best appreciated on axial image 83 of series 2. These findings are all concerning for sites of metastatic disease both in the mesentery, sigmoid mesocolon and right retroperitoneum. No significant volume of ascites. No pneumoperitoneum. Musculoskeletal: Chronic compression fracture of L1 with 25% loss of anterior vertebral body height (unchanged). There are no aggressive appearing lytic or blastic lesions noted in the visualized portions of the skeleton. IMPRESSION: 1. Worsening areas of soft tissue haziness in the small bowel mesentery, sigmoid mesocolon, as well as nodularity and soft tissue haziness in the right hemipelvis in the retroperitoneum, all of which are unusual in appearance but concerning for sites of progressive metastatic disease. 2. Slight increased nodularity in the periphery of the right upper lobe near the apex. This may simply reflect progressive postradiation mass-like fibrosis, however, the possibility of residual/recurrent disease in this area is not excluded, and close attention on follow-up studies is recommended. 3. Aortic atherosclerosis, in addition to left anterior descending coronary artery disease. Please note that although the presence of coronary artery calcium documents  the presence of coronary artery disease, the severity of this disease and any potential stenosis cannot be assessed on this non-gated CT examination. Assessment for  potential risk factor modification, dietary therapy or pharmacologic therapy may be warranted, if clinically indicated. 4. Additional incidental findings, as above. Aortic Atherosclerosis (ICD10-I70.0). Electronically Signed   By: Vinnie Langton M.D.   On: 06/09/2018 15:13    ASSESSMENT AND PLAN:  This is a very pleasant 57 years old white female with extensive stage small cell lung cancer status post systemic chemotherapy with carbo platinum and etoposide for 6 cycles and the patient rotated her treatment well except for chemotherapy-induced anemia and requirement for PRBCs and platelet transfusion. She had significant improvement in her disease with the chemotherapy. She also had prophylactic cranial irradiation. She also underwent stereotactic radiotherapy to progressive right upper lobe pulmonary nodule. The patient has been in observation for close to 2 years. Repeat CT scan of the chest, abdomen and pelvis performed recently showed evidence for disease progression in the abdomen. The patient was started on systemic chemotherapy again with carboplatin, etoposide and Tecentriq status post 1 cycle given last week.  She had significant weakness and fatigue with the first cycle of her treatment.  The patient also has gait imbalance.  Her oral intake is very limited. I recommended for the patient to receive 1 L of normal saline in the clinic today for dehydration. For the gait and balance, I will order MRI of the brain to rule out any brain metastasis in this patient with a small cell lung cancer. The patient will come back for follow-up visit in 2 weeks for evaluation before starting cycle #2. She was advised to call immediately if she has any concerning symptoms in the interval. The patient voices understanding of current disease status and treatment options and is in agreement with the current care plan. All questions were answered. The patient knows to call the clinic with any problems,  questions or concerns. We can certainly see the patient much sooner if necessary.  Disclaimer: This note was dictated with voice recognition software. Similar sounding words can inadvertently be transcribed and may not be corrected upon review.

## 2018-06-29 NOTE — Progress Notes (Signed)
Neutropenic precautions given to pt and husband. Stomatitis-/yeast -MMW called to [facility].

## 2018-06-29 NOTE — Patient Instructions (Signed)
Dehydration, Adult Dehydration is when there is not enough fluid or water in your body. This happens when you lose more fluids than you take in. Dehydration can range from mild to very bad. It should be treated right away to keep it from getting very bad. Symptoms of mild dehydration may include:  Thirst.  Dry lips.  Slightly dry mouth.  Dry, warm skin.  Dizziness. Symptoms of moderate dehydration may include:  Very dry mouth.  Muscle cramps.  Dark pee (urine). Pee may be the color of tea.  Your body making less pee.  Your eyes making fewer tears.  Heartbeat that is uneven or faster than normal (palpitations).  Headache.  Light-headedness, especially when you stand up from sitting.  Fainting (syncope). Symptoms of very bad dehydration may include:  Changes in skin, such as: ? Cold and clammy skin. ? Blotchy (mottled) or pale skin. ? Skin that does not quickly return to normal after being lightly pinched and let go (poor skin turgor).  Changes in body fluids, such as: ? Feeling very thirsty. ? Your eyes making fewer tears. ? Not sweating when body temperature is high, such as in hot weather. ? Your body making very little pee.  Changes in vital signs, such as: ? Weak pulse. ? Pulse that is more than 100 beats a minute when you are sitting still. ? Fast breathing. ? Low blood pressure.  Other changes, such as: ? Sunken eyes. ? Cold hands and feet. ? Confusion. ? Lack of energy (lethargy). ? Trouble waking up from sleep. ? Short-term weight loss. ? Unconsciousness. Follow these instructions at home:  If told by your doctor, drink an ORS: ? Make an ORS by using instructions on the package. ? Start by drinking small amounts, about  cup (120 mL) every 5-10 minutes. ? Slowly drink more until you have had the amount that your doctor said to have.  Drink enough clear fluid to keep your pee clear or pale yellow. If you were told to drink an ORS, finish the ORS  first, then start slowly drinking clear fluids. Drink fluids such as: ? Water. Do not drink only water by itself. Doing that can make the salt (sodium) level in your body get too low (hyponatremia). ? Ice chips. ? Fruit juice that you have added water to (diluted). ? Low-calorie sports drinks.  Avoid: ? Alcohol. ? Drinks that have a lot of sugar. These include high-calorie sports drinks, fruit juice that does not have water added, and soda. ? Caffeine. ? Foods that are greasy or have a lot of fat or sugar.  Take over-the-counter and prescription medicines only as told by your doctor.  Do not take salt tablets. Doing that can make the salt level in your body get too high (hypernatremia).  Eat foods that have minerals (electrolytes). Examples include bananas, oranges, potatoes, tomatoes, and spinach.  Keep all follow-up visits as told by your doctor. This is important. Contact a doctor if:  You have belly (abdominal) pain that: ? Gets worse. ? Stays in one area (localizes).  You have a rash.  You have a stiff neck.  You get angry or annoyed more easily than normal (irritability).  You are more sleepy than normal.  You have a harder time waking up than normal.  You feel: ? Weak. ? Dizzy. ? Very thirsty.  You have peed (urinated) only a small amount of very dark pee during 6-8 hours. Get help right away if:  You have symptoms of   very bad dehydration.  You cannot drink fluids without throwing up (vomiting).  Your symptoms get worse with treatment.  You have a fever.  You have a very bad headache.  You are throwing up or having watery poop (diarrhea) and it: ? Gets worse. ? Does not go away.  You have blood or something green (bile) in your throw-up.  You have blood in your poop (stool). This may cause poop to look black and tarry.  You have not peed in 6-8 hours.  You pass out (faint).  Your heart rate when you are sitting still is more than 100 beats a  minute.  You have trouble breathing. This information is not intended to replace advice given to you by your health care provider. Make sure you discuss any questions you have with your health care provider. Document Released: 06/15/2009 Document Revised: 03/08/2016 Document Reviewed: 10/13/2015 Elsevier Interactive Patient Education  2018 [employer]vier Inc.  

## 2018-06-29 NOTE — Patient Instructions (Signed)
Steps to Quit Smoking Smoking tobacco can be bad for your health. It can also affect almost every organ in your body. Smoking puts you and people around you at risk for many serious long-lasting (chronic) diseases. Quitting smoking is hard, but it is one of the best things that you can do for your health. It is never too late to quit. What are the benefits of quitting smoking? When you quit smoking, you lower your risk for getting serious diseases and conditions. They can include:  Lung cancer or lung disease.  Heart disease.  Stroke.  Heart attack.  Not being able to have children (infertility).  Weak bones (osteoporosis) and broken bones (fractures).  If you have coughing, wheezing, and shortness of breath, those symptoms may get better when you quit. You may also get sick less often. If you are pregnant, quitting smoking can help to lower your chances of having a baby of low birth weight. What can I do to help me quit smoking? Talk with your doctor about what can help you quit smoking. Some things you can do (strategies) include:  Quitting smoking totally, instead of slowly cutting back how much you smoke over a period of time.  Going to in-person counseling. You are more likely to quit if you go to many counseling sessions.  Using resources and support systems, such as: ? Online chats with a counselor. ? Phone quitlines. ? Printed self-help materials. ? Support groups or group counseling. ? Text messaging programs. ? Mobile phone apps or applications.  Taking medicines. Some of these medicines may have nicotine in them. If you are pregnant or breastfeeding, do not take any medicines to quit smoking unless your doctor says it is okay. Talk with your doctor about counseling or other things that can help you.  Talk with your doctor about using more than one strategy at the same time, such as taking medicines while you are also going to in-person counseling. This can help make  quitting easier. What things can I do to make it easier to quit? Quitting smoking might feel very hard at first, but there is a lot that you can do to make it easier. Take these steps:  Talk to your family and friends. Ask them to support and encourage you.  Call phone quitlines, reach out to support groups, or work with a counselor.  Ask people who smoke to not smoke around you.  Avoid places that make you want (trigger) to smoke, such as: ? Bars. ? Parties. ? Smoke-break areas at work.  Spend time with people who do not smoke.  Lower the stress in your life. Stress can make you want to smoke. Try these things to help your stress: ? Getting regular exercise. ? Deep-breathing exercises. ? Yoga. ? Meditating. ? Doing a body scan. To do this, close your eyes, focus on one area of your body at a time from head to toe, and notice which parts of your body are tense. Try to relax the muscles in those areas.  Download or buy apps on your mobile phone or tablet that can help you stick to your quit plan. There are many free apps, such as QuitGuide from the CDC (Centers for Disease Control and Prevention). You can find more support from smokefree.gov and other websites.  This information is not intended to replace advice given to you by your health care provider. Make sure you discuss any questions you have with your health care provider. Document Released: 06/15/2009 Document   Revised: 04/16/2016 Document Reviewed: 01/03/2015 Elsevier Interactive Patient Education  2018 Elsevier Inc.  

## 2018-07-06 ENCOUNTER — Telehealth: Payer: Self-pay | Admitting: *Deleted

## 2018-07-06 ENCOUNTER — Inpatient Hospital Stay: Payer: Medicare Other | Attending: Internal Medicine

## 2018-07-06 ENCOUNTER — Inpatient Hospital Stay: Payer: Medicare Other

## 2018-07-06 DIAGNOSIS — G894 Chronic pain syndrome: Secondary | ICD-10-CM | POA: Insufficient documentation

## 2018-07-06 DIAGNOSIS — R531 Weakness: Secondary | ICD-10-CM | POA: Insufficient documentation

## 2018-07-06 DIAGNOSIS — R5383 Other fatigue: Secondary | ICD-10-CM | POA: Insufficient documentation

## 2018-07-06 DIAGNOSIS — E876 Hypokalemia: Secondary | ICD-10-CM | POA: Insufficient documentation

## 2018-07-06 DIAGNOSIS — C778 Secondary and unspecified malignant neoplasm of lymph nodes of multiple regions: Secondary | ICD-10-CM | POA: Diagnosis not present

## 2018-07-06 DIAGNOSIS — F329 Major depressive disorder, single episode, unspecified: Secondary | ICD-10-CM | POA: Insufficient documentation

## 2018-07-06 DIAGNOSIS — M810 Age-related osteoporosis without current pathological fracture: Secondary | ICD-10-CM | POA: Insufficient documentation

## 2018-07-06 DIAGNOSIS — C3411 Malignant neoplasm of upper lobe, right bronchus or lung: Secondary | ICD-10-CM | POA: Insufficient documentation

## 2018-07-06 DIAGNOSIS — D61810 Antineoplastic chemotherapy induced pancytopenia: Secondary | ICD-10-CM | POA: Insufficient documentation

## 2018-07-06 DIAGNOSIS — F17210 Nicotine dependence, cigarettes, uncomplicated: Secondary | ICD-10-CM | POA: Insufficient documentation

## 2018-07-06 DIAGNOSIS — Z7689 Persons encountering health services in other specified circumstances: Secondary | ICD-10-CM | POA: Insufficient documentation

## 2018-07-06 DIAGNOSIS — N644 Mastodynia: Secondary | ICD-10-CM | POA: Diagnosis not present

## 2018-07-06 DIAGNOSIS — E785 Hyperlipidemia, unspecified: Secondary | ICD-10-CM | POA: Insufficient documentation

## 2018-07-06 DIAGNOSIS — I10 Essential (primary) hypertension: Secondary | ICD-10-CM | POA: Insufficient documentation

## 2018-07-06 DIAGNOSIS — T451X5A Adverse effect of antineoplastic and immunosuppressive drugs, initial encounter: Secondary | ICD-10-CM | POA: Diagnosis not present

## 2018-07-06 DIAGNOSIS — Z79899 Other long term (current) drug therapy: Secondary | ICD-10-CM | POA: Insufficient documentation

## 2018-07-06 DIAGNOSIS — C7889 Secondary malignant neoplasm of other digestive organs: Secondary | ICD-10-CM | POA: Diagnosis not present

## 2018-07-06 DIAGNOSIS — R630 Anorexia: Secondary | ICD-10-CM | POA: Insufficient documentation

## 2018-07-06 DIAGNOSIS — Z5112 Encounter for antineoplastic immunotherapy: Secondary | ICD-10-CM | POA: Diagnosis not present

## 2018-07-06 DIAGNOSIS — C3491 Malignant neoplasm of unspecified part of right bronchus or lung: Secondary | ICD-10-CM

## 2018-07-06 DIAGNOSIS — Z5111 Encounter for antineoplastic chemotherapy: Secondary | ICD-10-CM | POA: Diagnosis not present

## 2018-07-06 DIAGNOSIS — Z85828 Personal history of other malignant neoplasm of skin: Secondary | ICD-10-CM | POA: Insufficient documentation

## 2018-07-06 LAB — CMP (CANCER CENTER ONLY)
ALBUMIN: 3.4 g/dL — AB (ref 3.5–5.0)
ALT: 28 U/L (ref 0–44)
AST: 25 U/L (ref 15–41)
Alkaline Phosphatase: 142 U/L — ABNORMAL HIGH (ref 38–126)
Anion gap: 14 (ref 5–15)
BILIRUBIN TOTAL: 0.3 mg/dL (ref 0.3–1.2)
CALCIUM: 9 mg/dL (ref 8.9–10.3)
CO2: 27 mmol/L (ref 22–32)
CREATININE: 0.77 mg/dL (ref 0.44–1.00)
Chloride: 98 mmol/L (ref 98–111)
GFR, Est AFR Am: >60 mL/min (ref >60)
GLUCOSE: 111 mg/dL — AB (ref 70–99)
Potassium: 2.6 mmol/L — CL (ref 3.5–5.1)
Sodium: 139 mmol/L (ref 135–145)
TOTAL PROTEIN: 7.1 g/dL (ref 6.5–8.1)

## 2018-07-06 LAB — CBC WITH DIFFERENTIAL (CANCER CENTER ONLY)
Band Neutrophils: 4 %
Basophils Absolute: 0 10*3/uL (ref 0.0–0.1)
Basophils Relative: 0 %
EOS ABS: 0 10*3/uL (ref 0.0–0.5)
EOS PCT: 0 %
HCT: 30.8 % — ABNORMAL LOW (ref 36.0–46.0)
Hemoglobin: 10.7 g/dL — ABNORMAL LOW (ref 12.0–15.0)
LYMPHS ABS: 4.2 10*3/uL — AB (ref 0.7–4.0)
Lymphocytes Relative: 41 %
MCH: 34.4 pg — ABNORMAL HIGH (ref 26.0–34.0)
MCHC: 34.7 g/dL (ref 30.0–36.0)
MCV: 99 fL (ref 80.0–100.0)
METAMYELOCYTES PCT: 3 %
MONO ABS: 1 10*3/uL (ref 0.1–1.0)
MONOS PCT: 10 %
NEUTROS ABS: 5 10*3/uL (ref 1.7–17.7)
Neutrophils Relative %: 42 %
PLATELETS: 11 10*3/uL — AB (ref 150–400)
RBC: 3.11 MIL/uL — ABNORMAL LOW (ref 3.87–5.11)
RDW: 12.1 % (ref 11.5–15.5)
WBC: 10.2 10*3/uL (ref 4.0–10.5)
nRBC: 0 % (ref 0.0–0.2)

## 2018-07-06 MED ORDER — POTASSIUM CHLORIDE CRYS ER 20 MEQ PO TBCR: 40.0000 meq | EXTENDED_RELEASE_TABLET | Freq: Every day | ORAL | 0 refills | Status: DC

## 2018-07-06 MED ORDER — SODIUM CHLORIDE 0.9% FLUSH
10.0000 mL | INTRAVENOUS | Status: DC | PRN
  Administered 2018-07-06: 10 mL
  Filled 2018-07-06: qty 10

## 2018-07-06 MED ORDER — HEPARIN SOD (PORK) LOCK FLUSH 100 UNIT/ML IV SOLN
500.0000 [IU] | Freq: Once | INTRAVENOUS | Status: AC | PRN
  Administered 2018-07-06: 500 [IU]
  Filled 2018-07-06: qty 5

## 2018-07-06 MED FILL — POTASSIUM CL ER 20 MEQ TABL: 20 | 10 days supply | Qty: 20 | Fill #0

## 2018-07-06 NOTE — Telephone Encounter (Signed)
-----   Message from [name], MD sent at 07/06/2018  1:03 PM EST ----- Please order K dur 40 meq po qd x 10 ----- Message ----- From: Interface, Lab In [facility] Sent: 07/06/2018  12:00 PM EST To: [name], MD

## 2018-07-10 ENCOUNTER — Telehealth: Payer: Self-pay | Admitting: Medical Oncology

## 2018-07-10 NOTE — Telephone Encounter (Signed)
Pt declined MRI brain due to out of pocket expenses and "I am going to die anyway".

## 2018-07-10 NOTE — Telephone Encounter (Signed)
Sick , cancelled appt for next week.-Pt called to cancel appt for Monday "The whole house has the crude". Pt states she has temp of 100.3 f , nonproductive cough. She has a sore throat ("better since using MMW) and her stomach is sore "from coughing so much". She denies bleeding. She declines [facility] today. Last treatment was on 10/21-tecentriq,carbo , VP-16.  11/4-labs - plts 11k., K+ 2.6-pt stated she will start her [place] today .  Instructions given -Pt instructed to call for worsening symptoms including temp 100.5 or higher , chills with or without fever any bleeding or worsening pain. I encouraged her to keep appt on Monday and she agreed she will call us Monday am with update. Bleeding precautions reviewed with pts.

## 2018-07-13 ENCOUNTER — Inpatient Hospital Stay: Payer: Medicare Other

## 2018-07-13 ENCOUNTER — Encounter: Payer: Self-pay | Admitting: Internal Medicine

## 2018-07-13 ENCOUNTER — Inpatient Hospital Stay (HOSPITAL_BASED_OUTPATIENT_CLINIC_OR_DEPARTMENT_OTHER): Payer: Medicare Other | Admitting: Internal Medicine

## 2018-07-13 ENCOUNTER — Telehealth: Payer: Self-pay | Admitting: Internal Medicine

## 2018-07-13 ENCOUNTER — Telehealth: Payer: Self-pay | Admitting: Medical

## 2018-07-13 ENCOUNTER — Ambulatory Visit (HOSPITAL_BASED_OUTPATIENT_CLINIC_OR_DEPARTMENT_OTHER): Payer: Medicare Other | Admitting: Medical

## 2018-07-13 VITALS — BP 92/60 | HR 89 | Temp 98.7°F | Resp 18

## 2018-07-13 VITALS — BP 100/75 | HR 98 | Temp 98.5°F | Resp 18 | Ht 63.0 in | Wt 144.2 lb

## 2018-07-13 DIAGNOSIS — R531 Weakness: Secondary | ICD-10-CM

## 2018-07-13 DIAGNOSIS — C778 Secondary and unspecified malignant neoplasm of lymph nodes of multiple regions: Secondary | ICD-10-CM

## 2018-07-13 DIAGNOSIS — C3491 Malignant neoplasm of unspecified part of right bronchus or lung: Secondary | ICD-10-CM

## 2018-07-13 DIAGNOSIS — M810 Age-related osteoporosis without current pathological fracture: Secondary | ICD-10-CM

## 2018-07-13 DIAGNOSIS — T451X5A Adverse effect of antineoplastic and immunosuppressive drugs, initial encounter: Secondary | ICD-10-CM

## 2018-07-13 DIAGNOSIS — Z5112 Encounter for antineoplastic immunotherapy: Secondary | ICD-10-CM | POA: Diagnosis not present

## 2018-07-13 DIAGNOSIS — Z7689 Persons encountering health services in other specified circumstances: Secondary | ICD-10-CM | POA: Diagnosis not present

## 2018-07-13 DIAGNOSIS — F329 Major depressive disorder, single episode, unspecified: Secondary | ICD-10-CM

## 2018-07-13 DIAGNOSIS — R5382 Chronic fatigue, unspecified: Secondary | ICD-10-CM

## 2018-07-13 DIAGNOSIS — R5383 Other fatigue: Secondary | ICD-10-CM

## 2018-07-13 DIAGNOSIS — Z85828 Personal history of other malignant neoplasm of skin: Secondary | ICD-10-CM

## 2018-07-13 DIAGNOSIS — T8090XA Unspecified complication following infusion and therapeutic injection, initial encounter: Secondary | ICD-10-CM

## 2018-07-13 DIAGNOSIS — C3411 Malignant neoplasm of upper lobe, right bronchus or lung: Secondary | ICD-10-CM | POA: Diagnosis not present

## 2018-07-13 DIAGNOSIS — I10 Essential (primary) hypertension: Secondary | ICD-10-CM

## 2018-07-13 DIAGNOSIS — Z5111 Encounter for antineoplastic chemotherapy: Secondary | ICD-10-CM | POA: Diagnosis not present

## 2018-07-13 DIAGNOSIS — R630 Anorexia: Secondary | ICD-10-CM

## 2018-07-13 DIAGNOSIS — E785 Hyperlipidemia, unspecified: Secondary | ICD-10-CM

## 2018-07-13 DIAGNOSIS — C7889 Secondary malignant neoplasm of other digestive organs: Secondary | ICD-10-CM

## 2018-07-13 DIAGNOSIS — G894 Chronic pain syndrome: Secondary | ICD-10-CM

## 2018-07-13 DIAGNOSIS — Z79899 Other long term (current) drug therapy: Secondary | ICD-10-CM

## 2018-07-13 DIAGNOSIS — D6481 Anemia due to antineoplastic chemotherapy: Secondary | ICD-10-CM

## 2018-07-13 LAB — CMP (CANCER CENTER ONLY)
ALBUMIN: 2.8 g/dL — AB (ref 3.5–5.0)
ALK PHOS: 148 U/L — AB (ref 38–126)
ALT: 15 U/L (ref 0–44)
AST: 15 U/L (ref 15–41)
Anion gap: 12 (ref 5–15)
BUN: 4 mg/dL — ABNORMAL LOW (ref 6–20)
CALCIUM: 7.8 mg/dL — AB (ref 8.9–10.3)
CHLORIDE: 100 mmol/L (ref 98–111)
CO2: 26 mmol/L (ref 22–32)
Creatinine: 0.76 mg/dL (ref 0.44–1.00)
GFR, Est AFR Am: >60 mL/min (ref >60)
GFR, Estimated: >60 mL/min (ref >60)
GLUCOSE: 115 mg/dL — AB (ref 70–99)
Potassium: 3.2 mmol/L — ABNORMAL LOW (ref 3.5–5.1)
SODIUM: 138 mmol/L (ref 135–145)
Total Bilirubin: 0.3 mg/dL (ref 0.3–1.2)
Total Protein: 6.7 g/dL (ref 6.5–8.1)

## 2018-07-13 LAB — CBC WITH DIFFERENTIAL (CANCER CENTER ONLY)
ABS IMMATURE GRANULOCYTES: 0.54 10*3/uL — AB (ref 0.00–0.07)
BASOS ABS: 0 10*3/uL (ref 0.0–0.1)
BASOS PCT: 0 %
Eosinophils Absolute: 0 10*3/uL (ref 0.0–0.5)
Eosinophils Relative: 0 %
HCT: 28.4 % — ABNORMAL LOW (ref 36.0–46.0)
HEMOGLOBIN: 9.6 g/dL — AB (ref 12.0–15.0)
Immature Granulocytes: 4 %
LYMPHS PCT: 11 %
Lymphs Abs: 1.4 10*3/uL (ref 0.7–4.0)
MCH: 34.3 pg — ABNORMAL HIGH (ref 26.0–34.0)
MCHC: 33.8 g/dL (ref 30.0–36.0)
MCV: 101.4 fL — ABNORMAL HIGH (ref 80.0–100.0)
Monocytes Absolute: 1.8 10*3/uL — ABNORMAL HIGH (ref 0.1–1.0)
Monocytes Relative: 14 %
NEUTROS ABS: 8.7 10*3/uL — AB (ref 1.7–7.7)
NRBC: 0 % (ref 0.0–0.2)
Neutrophils Relative %: 71 %
PLATELETS: 262 10*3/uL (ref 150–400)
RBC: 2.8 MIL/uL — AB (ref 3.87–5.11)
RDW: 12.1 % (ref 11.5–15.5)
WBC Count: 12.5 10*3/uL — ABNORMAL HIGH (ref 4.0–10.5)

## 2018-07-13 LAB — TSH: TSH: 1.061 u[IU]/mL (ref 0.308–3.960)

## 2018-07-13 MED ORDER — FAMOTIDINE IN NACL 20-0.9 MG/50ML-% IV SOLN: 20.0000 mg | Freq: Once | INTRAVENOUS | Status: DC

## 2018-07-13 MED ORDER — SODIUM CHLORIDE 0.9% FLUSH
10.0000 mL | INTRAVENOUS | Status: DC | PRN
  Administered 2018-07-13: 10 mL
  Filled 2018-07-13: qty 10

## 2018-07-13 MED ORDER — SODIUM CHLORIDE 0.9 % IV SOLN
Freq: Once | INTRAVENOUS | Status: AC
  Administered 2018-07-13: 13:00:00 via INTRAVENOUS
  Filled 2018-07-13: qty 250

## 2018-07-13 MED ORDER — DIPHENHYDRAMINE HCL 50 MG/ML IJ SOLN
25.0000 mg | Freq: Once | INTRAMUSCULAR | Status: AC
  Administered 2018-07-13: 25 mg via INTRAVENOUS

## 2018-07-13 MED ORDER — SODIUM CHLORIDE 0.9 % IV SOLN
80.0000 mg/m2 | Freq: Once | INTRAVENOUS | Status: AC
  Administered 2018-07-13: 140 mg via INTRAVENOUS
  Filled 2018-07-13: qty 7

## 2018-07-13 MED ORDER — SODIUM CHLORIDE 0.9 % IV SOLN
20.0000 mg | Freq: Once | INTRAVENOUS | Status: AC
  Administered 2018-07-13: 20 mg via INTRAVENOUS
  Filled 2018-07-13: qty 2

## 2018-07-13 MED ORDER — SODIUM CHLORIDE 0.9 % IV SOLN
Freq: Once | INTRAVENOUS | Status: AC
  Administered 2018-07-13: 13:00:00 via INTRAVENOUS
  Filled 2018-07-13: qty 5

## 2018-07-13 MED ORDER — PALONOSETRON HCL INJECTION 0.25 MG/5ML
INTRAVENOUS | Status: AC
  Filled 2018-07-13: qty 5

## 2018-07-13 MED ORDER — FAMOTIDINE IN NACL 20-0.9 MG/50ML-% IV SOLN
20.0000 mg | Freq: Once | INTRAVENOUS | Status: AC
  Administered 2018-07-13: 20 mg via INTRAVENOUS
  Filled 2018-07-13: qty 50

## 2018-07-13 MED ORDER — SODIUM CHLORIDE 0.9 % IV SOLN
1200.0000 mg | Freq: Once | INTRAVENOUS | Status: AC
  Administered 2018-07-13: 1200 mg via INTRAVENOUS
  Filled 2018-07-13: qty 20

## 2018-07-13 MED ORDER — HEPARIN SOD (PORK) LOCK FLUSH 100 UNIT/ML IV SOLN
500.0000 [IU] | Freq: Once | INTRAVENOUS | Status: AC | PRN
  Administered 2018-07-13: 500 [IU]
  Filled 2018-07-13: qty 5

## 2018-07-13 MED ORDER — DIPHENHYDRAMINE HCL 50 MG/ML IJ SOLN
INTRAMUSCULAR | Status: AC
  Filled 2018-07-13: qty 1

## 2018-07-13 MED ORDER — PALONOSETRON HCL INJECTION 0.25 MG/5ML
0.2500 mg | Freq: Once | INTRAVENOUS | Status: AC
  Administered 2018-07-13: 0.25 mg via INTRAVENOUS

## 2018-07-13 MED ORDER — SODIUM CHLORIDE 0.9 % IV SOLN
435.2000 mg | Freq: Once | INTRAVENOUS | Status: AC
  Administered 2018-07-13: 440 mg via INTRAVENOUS
  Filled 2018-07-13: qty 44

## 2018-07-13 NOTE — Progress Notes (Signed)
[facility] [phone]   [phone]  OFFICE PROGRESS NOTE  [name], MD [address] [place] [address]  DIAGNOSIS: Extensive stage (T2a, N3, [name], large right anterior mediastinal and supraclavicular lymphadenopathy as well as pancreatic and splenic metastasis diagnosed in September 2017.  The patient had disease progression in October 2019.  PRIOR THERAPY:  1) Systemic chemotherapy was carboplatin for AUC of 5 on day 1 and etoposide 100 MG/M2 on days 1, 2 and 3 with Neulasta support. Status post 6 cycles with significant response of her disease. 2) Prophylactic cranial irradiation under the care of Dr. Sondra Come on 12/02/2016. 3) stereotactic radiotherapy to the recurrent right upper lobe pulmonary nodule under the care of Dr. Sondra Come completed November 10, 2017.  CURRENT THERAPY: Rate treatment with systemic chemotherapy with carboplatin for AUC of 5 on day 1 and etoposide 100 mg/M2 on days 1, 2 and 3 as well as Tecentriq (Atezolizumab) 1200 mg IV every 3 weeks with Neulasta support.  First dose June 22, 2018 for disease recurrence.  Status post 1 cycle.  Starting from cycle #2 her dose of carboplatin will be reduced to AUC of 4 and etoposide 80 mg/M2 on days 1, 2 and 3 in addition to the regular dose of Tecentriq.  INTERVAL HISTORY: Debra Kidd 57 y.o. female returns to the clinic today for follow-up visit accompanied by her husband.  The patient is feeling much better today.  She has a rough time after the first cycle of her treatment with significant fatigue and weakness as well as lack of appetite and dehydration.  She received IV hydration in the clinic after her last treatment.  The patient denied having any current chest pain, shortness of breath, cough or hemoptysis.  She denied having any fever or chills.  She has no current nausea, vomiting, diarrhea or constipation.  She  declined to proceed with the MRI of the brain because she is concerned about the copayment for the scan.  She is here today for evaluation before starting cycle #2.  MEDICAL HISTORY: Past Medical History:  Diagnosis Date  . 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 Blood type, Rh positive   . Cancer ([facility])   . Chronic pain syndrome   . Depression 08/07/2016  . Encounter for antineoplastic chemotherapy 07/17/2016  . History of external beam radiation therapy 11/19/16-12/02/16   brain 25 Gy in 10 fractions  . Hyperlipidemia   . Hypertension   . Hypertension 08/07/2016  . Osteoporosis     ALLERGIES:  is allergic to codeine; motrin [ibuprofen]; thiazide-type diuretics; and vicodin [hydrocodone-acetaminophen].  MEDICATIONS:  Current Outpatient Medications  Medication Sig Dispense Refill  . celecoxib (CELEBREX) 200 MG capsule Take 200 mg by mouth daily.    . diazepam (VALIUM) 5 MG tablet Take 5 mg by mouth 4 (four) times daily as needed.  2  . diphenhydrAMINE (BENADRYL) 25 MG tablet Take 25 mg by mouth every 6 (six) hours as needed.    . lidocaine-prilocaine (EMLA) cream Apply 1 application topically as needed. 30 g 0  . loperamide (IMODIUM A-D) 2 MG tablet Take 2 mg by mouth 4 (four) times daily as needed for diarrhea or loose stools.    . magic mouthwash SOLN Take 5 mLs by mouth 4 (four) times daily. 1:1:1 mix 240 mL 0  . potassium chloride 20  MEQ TBCR Take 10 mEq by mouth daily. 7 tablet 0  . potassium chloride SA (K-DUR,KLOR-CON) 20 MEQ tablet Take 2 tablets (40 mEq total) by mouth daily. 20 tablet 0  . prochlorperazine (COMPAZINE) 10 MG tablet Take 1 tablet (10 mg total) by mouth every 6 (six) hours as needed for nausea or vomiting. (Patient not taking: Reported on 06/29/2018) 30 tablet 0  . traMADol (ULTRAM) 50 MG tablet Take by mouth every 6 (six) hours as needed.     No current facility-administered medications for this visit.     SURGICAL HISTORY:  Past Surgical History:   Procedure Laterality Date  . ABDOMINAL HYSTERECTOMY  1981  . APPENDECTOMY     at age 30  . EYE SURGERY     left  . IR GENERIC HISTORICAL  06/03/2016   IR FLUORO GUIDE PORT INSERTION RIGHT 06/03/2016 [facility]  . IR GENERIC HISTORICAL  06/03/2016   IR US GUIDE VASC ACCESS RIGHT 06/03/2016 [facility]  . SKIN CANCER EXCISION     basal cell carcinoma L side of face    REVIEW OF SYSTEMS:  A comprehensive review of systems was negative except for: Constitutional: positive for fatigue Musculoskeletal: positive for muscle weakness   PHYSICAL EXAMINATION: General appearance: alert, cooperative, fatigued and no distress Head: Normocephalic, without obvious abnormality, atraumatic Neck: no adenopathy, no JVD, supple, symmetrical, trachea midline and thyroid not enlarged, symmetric, no tenderness/mass/nodules Lymph nodes: Cervical, supraclavicular, and axillary nodes normal. Resp: clear to auscultation bilaterally Back: symmetric, no curvature. ROM normal. No CVA tenderness. Cardio: regular rate and rhythm, S1, S2 normal, no murmur, click, rub or gallop GI: soft, non-tender; bowel sounds normal; no masses,  no organomegaly Extremities: extremities normal, atraumatic, no cyanosis or edema  ECOG PERFORMANCE STATUS: 1 - Symptomatic but completely ambulatory  Blood pressure 100/75, pulse 98, temperature 98.5 F (36.9 C), temperature source Oral, resp. rate 18, height 5\' 3"  (1.6 m), weight 144 lb 3.2 oz (65.4 kg), SpO2 100 %.  LABORATORY DATA: Lab Results  Component Value Date   WBC 12.5 (H) 07/13/2018   HGB 9.6 (L) 07/13/2018   HCT 28.4 (L) 07/13/2018   MCV 101.4 (H) 07/13/2018   PLT 262 07/13/2018      Chemistry      Component Value Date/Time   NA 138 07/13/2018 0948   NA 141 08/01/2017 0835   K 3.2 (L) 07/13/2018 0948   K 3.5 08/01/2017 0835   CL 100 07/13/2018 0948   CO2 26 07/13/2018 0948   CO2 25 08/01/2017 0835   BUN 4 (L) 07/13/2018 0948   BUN 8.0 08/01/2017 0835    CREATININE 0.76 07/13/2018 0948   CREATININE 0.9 08/01/2017 0835      Component Value Date/Time   CALCIUM 7.8 (L) 07/13/2018 0948   CALCIUM 9.9 08/01/2017 0835   ALKPHOS 148 (H) 07/13/2018 0948   ALKPHOS 142 08/01/2017 0835   AST 15 07/13/2018 0948   AST 15 08/01/2017 0835   ALT 15 07/13/2018 0948   ALT 19 08/01/2017 0835   BILITOT 0.3 07/13/2018 0948   BILITOT 0.35 08/01/2017 0835       RADIOGRAPHIC STUDIES: No results found.  ASSESSMENT AND PLAN:  This is a very pleasant 57 years old white female with extensive stage small cell lung cancer status post systemic chemotherapy with carbo platinum and etoposide for 6 cycles and the patient rotated her treatment well except for chemotherapy-induced anemia and requirement for PRBCs and platelet transfusion. She had significant improvement in  her disease with the chemotherapy. She also had prophylactic cranial irradiation. She also underwent stereotactic radiotherapy to progressive right upper lobe pulmonary nodule. The patient has been in observation for close to 2 years. Repeat CT scan of the chest, abdomen and pelvis performed recently showed evidence for disease progression in the abdomen. The patient was started on systemic chemotherapy again with carboplatin, etoposide and Tecentriq status post 1 cycle given last week. She has a rough time the first week after the treatment with significant weakness and fatigue as well as lack of appetite and weight loss.  She is recovering much better now. I recommended for her to proceed with cycle #2 today but I will reduce the dose of carboplatin to AUC of 4 and etoposide 80 mg/M2 starting from cycle #2.  She will continue on the standard dose of Tecentriq. The patient was also strongly advised to increase her oral intake and hydration. I will see her back for follow-up visit in 3 weeks for evaluation after repeating CT scan of the chest, abdomen and pelvis for restaging of her disease. The  patient voices understanding of current disease status and treatment options and is in agreement with the current care plan. All questions were answered. The patient knows to call the clinic with any problems, questions or concerns. We can certainly see the patient much sooner if necessary.  Disclaimer: This note was dictated with voice recognition software. Similar sounding words can inadvertently be transcribed and may not be corrected upon review.

## 2018-07-13 NOTE — Telephone Encounter (Signed)
Appts already scheduled per 11/11 los.

## 2018-07-13 NOTE — Telephone Encounter (Signed)
Pt scheduled per 11/11 sch message

## 2018-07-13 NOTE — Patient Instructions (Signed)
[facility] Discharge Instructions for Patients Receiving Chemotherapy  Today you received the following chemotherapy agents Tecentriq, Carboplatin, and Etoposide  To help prevent nausea and vomiting after your treatment, we encourage you to take your nausea medication as directed If you develop nausea and vomiting that is not controlled by your nausea medication, call the clinic.   BELOW ARE SYMPTOMS THAT SHOULD BE REPORTED IMMEDIATELY:  *FEVER GREATER THAN 100.5 F  *CHILLS WITH OR WITHOUT FEVER  NAUSEA AND VOMITING THAT IS NOT CONTROLLED WITH YOUR NAUSEA MEDICATION  *UNUSUAL SHORTNESS OF BREATH  *UNUSUAL BRUISING OR BLEEDING  TENDERNESS IN MOUTH AND THROAT WITH OR WITHOUT PRESENCE OF ULCERS  *URINARY PROBLEMS  *BOWEL PROBLEMS  UNUSUAL RASH Items with * indicate a potential emergency and should be followed up as soon as possible.  Feel free to call the clinic should you have any questions or concerns. The clinic phone number is [phone].  Please show the [place] at check-in to the Emergency Department and triage nurse.

## 2018-07-13 NOTE — Progress Notes (Signed)
1429 pt receiving IV tecentriq (8 minutes into infusion) and complaint of "Fuzzy headed/Light headed" and "tingling in fingers and toes".  Infusion paused, VSS and document in epic.  Sandi Mealy Pa notified and orders received to administer 20 mg IV pepcid  1433 IV pepcid administered  1452 Pt BP 88/56, per Sandi Mealy, PA administer 500 mL of NS over 30 minutes and re- check BP  1522- pt BP 88/58 with slight complaint of dizziness.  per Sandi Mealy, PA okay to precede with treatment.   1530-  Tecentriq restarted and pt tolerated rest of treatment with no issues.

## 2018-07-14 ENCOUNTER — Ambulatory Visit (HOSPITAL_BASED_OUTPATIENT_CLINIC_OR_DEPARTMENT_OTHER): Payer: Medicare Other | Admitting: Medical

## 2018-07-14 ENCOUNTER — Inpatient Hospital Stay: Payer: Medicare Other

## 2018-07-14 VITALS — BP 101/61 | HR 84 | Temp 97.9°F | Resp 19

## 2018-07-14 DIAGNOSIS — T8090XA Unspecified complication following infusion and therapeutic injection, initial encounter: Secondary | ICD-10-CM | POA: Diagnosis not present

## 2018-07-14 DIAGNOSIS — C3491 Malignant neoplasm of unspecified part of right bronchus or lung: Secondary | ICD-10-CM

## 2018-07-14 DIAGNOSIS — Z5111 Encounter for antineoplastic chemotherapy: Secondary | ICD-10-CM | POA: Diagnosis not present

## 2018-07-14 MED ORDER — SODIUM CHLORIDE 0.9 % IV SOLN
80.0000 mg/m2 | Freq: Once | INTRAVENOUS | Status: AC
  Administered 2018-07-14: 140 mg via INTRAVENOUS
  Filled 2018-07-14: qty 7

## 2018-07-14 MED ORDER — SODIUM CHLORIDE 0.9 % IV SOLN
Freq: Once | INTRAVENOUS | Status: AC
  Administered 2018-07-14: 10:00:00 via INTRAVENOUS
  Filled 2018-07-14: qty 250

## 2018-07-14 MED ORDER — HEPARIN SOD (PORK) LOCK FLUSH 100 UNIT/ML IV SOLN
500.0000 [IU] | Freq: Once | INTRAVENOUS | Status: AC | PRN
  Administered 2018-07-14: 500 [IU]
  Filled 2018-07-14: qty 5

## 2018-07-14 MED ORDER — SODIUM CHLORIDE 0.9 % IV SOLN
20.0000 mg | Freq: Once | INTRAVENOUS | Status: AC
  Administered 2018-07-14: 20 mg via INTRAVENOUS
  Filled 2018-07-14: qty 2

## 2018-07-14 MED ORDER — DEXAMETHASONE SODIUM PHOSPHATE 10 MG/ML IJ SOLN
10.0000 mg | Freq: Once | INTRAMUSCULAR | Status: AC
  Administered 2018-07-14: 10 mg via INTRAVENOUS

## 2018-07-14 MED ORDER — SODIUM CHLORIDE 0.9% FLUSH
10.0000 mL | INTRAVENOUS | Status: DC | PRN
  Administered 2018-07-14: 10 mL
  Filled 2018-07-14: qty 10

## 2018-07-14 MED ORDER — FAMOTIDINE IN NACL 20-0.9 MG/50ML-% IV SOLN: 20.0000 mg | Freq: Once | INTRAVENOUS | Status: DC

## 2018-07-14 MED ORDER — DEXAMETHASONE SODIUM PHOSPHATE 10 MG/ML IJ SOLN
INTRAMUSCULAR | Status: AC
  Filled 2018-07-14: qty 1

## 2018-07-14 NOTE — Progress Notes (Signed)
At 10:47 pt began c/o "feeling funny like yesterday". Reported numbness in lower half of her fingers and distorted hearing. Etoposide infusion stopped and clamped off, and saline line opened wide. [facility] Client notified and came to assess pt. BP marginally lower than initial assessment, but all other vitals stable. Received orders to administer 20 mg Pepcid IVPB. Orders placed and drug administered. After about 5 min into Pepcid infusion, pt stated symptoms were improving. Instructed by Lucianne Lei to finish Pepcid infusion and then wait another 10-15 min before restarting Etoposide infusion. Pt updated and agreeable on plan. Will continue to monitor.  Vitals:   07/14/18 0929 07/14/18 1051 07/14/18 1109  BP: 117/68 97/65 101/61  Pulse: 91 84   Resp: 18 19   Temp: 98.3 F (36.8 C) 97.9 F (36.6 C)   TempSrc: Oral Oral   SpO2: 100% 99% 100%   Pt tolerated remainder of Etoposide infusion without incident. PAC flushed according to protocol, and de-accessed without issue. Pt ambulated out of clinic with husband without incident.

## 2018-07-14 NOTE — Progress Notes (Signed)
    DATE:  07/14/2018                                      X CHEMO/IMMUNOTHERAPY REACTION              MD: Dr. Eilleen Kempf   AGENT/BLOOD PRODUCT RECEIVING TODAY:               Carboplatin, etoposide, and Tecentriq   AGENT/BLOOD PRODUCT RECEIVING IMMEDIATELY PRIOR TO REACTION:           Tecentriq   VS: BP:      92/64   P:        96       SPO2:        90% on room air                   REACTION(S):            "Fuzzy headedness ", numbness in the hands and feet, changes in hearing, dizziness, and mild hypotension   PREMEDS:      Aloxi, Benadryl 25 mg IV, dexamethasone 10 mg IV, Emend, and Pepcid 20 mg IV   INTERVENTION: Pepcid 20 mg IV x1 and a normal saline bolus of 500 mL's over 30 minutes   Review of Systems  Review of Systems  Constitutional: Negative for chills, diaphoresis and fever.  HENT: Negative for trouble swallowing and voice change.        Changes in hearing  Respiratory: Negative for cough, chest tightness, shortness of breath and wheezing.   Cardiovascular: Negative for chest pain and palpitations.  Gastrointestinal: Negative for abdominal pain, constipation, diarrhea, nausea and vomiting.  Musculoskeletal: Negative for back pain and myalgias.  Neurological: Positive for dizziness and numbness. Negative for light-headedness and headaches.     Physical Exam  Physical Exam  Constitutional: No distress.  HENT:  Head: Normocephalic and atraumatic.  Cardiovascular: Normal rate, regular rhythm and normal heart sounds. Exam reveals no gallop and no friction rub.  No murmur heard. Pulmonary/Chest: Effort normal and breath sounds normal. No respiratory distress. She has no wheezes. She has no rales.  Neurological: She is alert.  Skin: Skin is warm and dry. No rash noted. She is not diaphoretic. No erythema.    OUTCOME:                 The patient's blood pressure remained slightly low despite receiving a bolus of normal saline.  Despite this, Tecentriq was  restarted with the patient completing her therapy without any additional issues of concern.   Sandi Mealy, MHS, PA-C

## 2018-07-14 NOTE — Patient Instructions (Signed)
[facility] Discharge Instructions for Patients Receiving Chemotherapy  Today you received the following chemotherapy agents: Etoposide (Vepesid)  To help prevent nausea and vomiting after your treatment, we encourage you to take your nausea medication as directed.    If you develop nausea and vomiting that is not controlled by your nausea medication, call the clinic.   BELOW ARE SYMPTOMS THAT SHOULD BE REPORTED IMMEDIATELY:  *FEVER GREATER THAN 100.5 F  *CHILLS WITH OR WITHOUT FEVER  NAUSEA AND VOMITING THAT IS NOT CONTROLLED WITH YOUR NAUSEA MEDICATION  *UNUSUAL SHORTNESS OF BREATH  *UNUSUAL BRUISING OR BLEEDING  TENDERNESS IN MOUTH AND THROAT WITH OR WITHOUT PRESENCE OF ULCERS  *URINARY PROBLEMS  *BOWEL PROBLEMS  UNUSUAL RASH Items with * indicate a potential emergency and should be followed up as soon as possible.  Feel free to call the clinic should you have any questions or concerns. The clinic phone number is [phone].  Please show the [facility] at check-in to the Emergency Department and triage nurse.

## 2018-07-14 NOTE — Progress Notes (Signed)
    DATE:  07/14/2018                                          X CHEMO/IMMUNOTHERAPY REACTION              MD: Dr. Fanny Bien. Mohamed   AGENT/BLOOD PRODUCT RECEIVING TODAY:               Etoposide   AGENT/BLOOD PRODUCT RECEIVING IMMEDIATELY PRIOR TO REACTION:           Etoposide   VS: BP:      97/65   P:        82       SPO2:        100% on room air                  REACTION(S):           "Feeling funny", numbness of the fingers, changes in hearing (the patient had a similar reaction yesterday to Tecentriq)   PREMEDS:       Dexamethasone 10 mg IV   INTERVENTION: Pepcid 20 mg IV x1   Review of Systems  Review of Systems  Constitutional: Negative for chills, diaphoresis and fever.       "Feeling funny"  HENT: Negative for trouble swallowing and voice change.        Changes in hearing  Respiratory: Negative for cough, chest tightness, shortness of breath and wheezing.   Cardiovascular: Negative for chest pain and palpitations.  Gastrointestinal: Negative for abdominal pain, constipation, diarrhea, nausea and vomiting.  Musculoskeletal: Negative for back pain and myalgias.  Neurological: Positive for numbness. Negative for dizziness, light-headedness and headaches.     Physical Exam  Physical Exam  Constitutional: No distress.  HENT:  Head: Normocephalic and atraumatic.  Cardiovascular: Normal rate, regular rhythm and normal heart sounds. Exam reveals no gallop and no friction rub.  No murmur heard. Pulmonary/Chest: Effort normal and breath sounds normal. No respiratory distress. She has no wheezes. She has no rales.  Neurological: She is alert.  Skin: Skin is warm and dry. No rash noted. She is not diaphoretic. No erythema.    OUTCOME:            Etoposide was restarted after the patient was dosed with Pepcid 20 mg IV x1.  She was able to complete the remainder of her infusion without any additional issues of concern.        Sandi Mealy, MHS, PA-C

## 2018-07-15 ENCOUNTER — Inpatient Hospital Stay: Payer: Medicare Other

## 2018-07-15 VITALS — BP 132/73 | HR 83 | Temp 98.0°F | Resp 18

## 2018-07-15 DIAGNOSIS — C3491 Malignant neoplasm of unspecified part of right bronchus or lung: Secondary | ICD-10-CM

## 2018-07-15 DIAGNOSIS — Z5111 Encounter for antineoplastic chemotherapy: Secondary | ICD-10-CM | POA: Diagnosis not present

## 2018-07-15 MED ORDER — SODIUM CHLORIDE 0.9 % IV SOLN
Freq: Once | INTRAVENOUS | Status: AC
  Administered 2018-07-15: 09:00:00 via INTRAVENOUS
  Filled 2018-07-15: qty 250

## 2018-07-15 MED ORDER — DEXAMETHASONE SODIUM PHOSPHATE 10 MG/ML IJ SOLN
10.0000 mg | Freq: Once | INTRAMUSCULAR | Status: AC
  Administered 2018-07-15: 10 mg via INTRAVENOUS

## 2018-07-15 MED ORDER — DEXAMETHASONE SODIUM PHOSPHATE 10 MG/ML IJ SOLN
INTRAMUSCULAR | Status: AC
  Filled 2018-07-15: qty 1

## 2018-07-15 MED ORDER — HEPARIN SOD (PORK) LOCK FLUSH 100 UNIT/ML IV SOLN
500.0000 [IU] | Freq: Once | INTRAVENOUS | Status: AC | PRN
  Administered 2018-07-15: 500 [IU]
  Filled 2018-07-15: qty 5

## 2018-07-15 MED ORDER — SODIUM CHLORIDE 0.9 % IV SOLN
80.0000 mg/m2 | Freq: Once | INTRAVENOUS | Status: AC
  Administered 2018-07-15: 140 mg via INTRAVENOUS
  Filled 2018-07-15: qty 7

## 2018-07-15 MED ORDER — SODIUM CHLORIDE 0.9 % IV SOLN
20.0000 mg | Freq: Once | INTRAVENOUS | Status: AC
  Administered 2018-07-15: 20 mg via INTRAVENOUS
  Filled 2018-07-15: qty 2

## 2018-07-15 MED ORDER — FAMOTIDINE IN NACL 20-0.9 MG/50ML-% IV SOLN: 20.0000 mg | Freq: Once | INTRAVENOUS | Status: DC

## 2018-07-15 MED ORDER — SODIUM CHLORIDE 0.9% FLUSH
10.0000 mL | INTRAVENOUS | Status: DC | PRN
  Administered 2018-07-15: 10 mL
  Filled 2018-07-15: qty 10

## 2018-07-15 NOTE — Progress Notes (Signed)
07/15/18  Patient with mild reaction during etoposide infusions, resolved post famotidine infusion.  Request to add famotidine 20 mg IVPB to all etoposide treatments.  Addition added by pharmacy.  T.O. Dr. Minna Antis Ricks RN/Dafna Romo Ronnald Ramp, PharmD

## 2018-07-15 NOTE — Patient Instructions (Signed)
[facility] Discharge Instructions for Patients Receiving Chemotherapy  Today you received the following chemotherapy agents: Etoposide (Vepesid)  To help prevent nausea and vomiting after your treatment, we encourage you to take your nausea medication as directed.    If you develop nausea and vomiting that is not controlled by your nausea medication, call the clinic.   BELOW ARE SYMPTOMS THAT SHOULD BE REPORTED IMMEDIATELY:  *FEVER GREATER THAN 100.5 F  *CHILLS WITH OR WITHOUT FEVER  NAUSEA AND VOMITING THAT IS NOT CONTROLLED WITH YOUR NAUSEA MEDICATION  *UNUSUAL SHORTNESS OF BREATH  *UNUSUAL BRUISING OR BLEEDING  TENDERNESS IN MOUTH AND THROAT WITH OR WITHOUT PRESENCE OF ULCERS  *URINARY PROBLEMS  *BOWEL PROBLEMS  UNUSUAL RASH Items with * indicate a potential emergency and should be followed up as soon as possible.  Feel free to call the clinic should you have any questions or concerns. The clinic phone number is [phone].  Please show the [facility] at check-in to the Emergency Department and triage nurse.

## 2018-07-17 ENCOUNTER — Inpatient Hospital Stay: Payer: Medicare Other

## 2018-07-17 DIAGNOSIS — Z5111 Encounter for antineoplastic chemotherapy: Secondary | ICD-10-CM | POA: Diagnosis not present

## 2018-07-17 DIAGNOSIS — C3491 Malignant neoplasm of unspecified part of right bronchus or lung: Secondary | ICD-10-CM

## 2018-07-17 MED ORDER — PEGFILGRASTIM-CBQV 6 MG/0.6ML ~~LOC~~ SOSY
PREFILLED_SYRINGE | SUBCUTANEOUS | Status: AC
  Filled 2018-07-17: qty 0.6

## 2018-07-17 MED ORDER — SODIUM CHLORIDE 0.9% FLUSH
10.0000 mL | INTRAVENOUS | Status: DC | PRN
  Filled 2018-07-17: qty 10

## 2018-07-17 MED ORDER — PEGFILGRASTIM-CBQV 6 MG/0.6ML ~~LOC~~ SOSY
6.0000 mg | PREFILLED_SYRINGE | Freq: Once | SUBCUTANEOUS | Status: AC
  Administered 2018-07-17: 6 mg via SUBCUTANEOUS

## 2018-07-20 ENCOUNTER — Inpatient Hospital Stay: Payer: Medicare Other

## 2018-07-20 DIAGNOSIS — Z5111 Encounter for antineoplastic chemotherapy: Secondary | ICD-10-CM | POA: Diagnosis not present

## 2018-07-20 DIAGNOSIS — R5382 Chronic fatigue, unspecified: Secondary | ICD-10-CM

## 2018-07-20 DIAGNOSIS — C3491 Malignant neoplasm of unspecified part of right bronchus or lung: Secondary | ICD-10-CM

## 2018-07-20 LAB — CMP (CANCER CENTER ONLY)
ALBUMIN: 3.2 g/dL — AB (ref 3.5–5.0)
ALK PHOS: 135 U/L — AB (ref 38–126)
ALT: 39 U/L (ref 0–44)
AST: 27 U/L (ref 15–41)
Anion gap: 8 (ref 5–15)
BILIRUBIN TOTAL: 0.9 mg/dL (ref 0.3–1.2)
BUN: 14 mg/dL (ref 6–20)
CALCIUM: 8.6 mg/dL — AB (ref 8.9–10.3)
CO2: 27 mmol/L (ref 22–32)
Chloride: 103 mmol/L (ref 98–111)
Creatinine: 0.67 mg/dL (ref 0.44–1.00)
GFR, Est AFR Am: >60 mL/min (ref >60)
GFR, Estimated: >60 mL/min (ref >60)
GLUCOSE: 102 mg/dL — AB (ref 70–99)
Potassium: 4 mmol/L (ref 3.5–5.1)
Sodium: 138 mmol/L (ref 135–145)
TOTAL PROTEIN: 6.9 g/dL (ref 6.5–8.1)

## 2018-07-20 LAB — CBC WITH DIFFERENTIAL (CANCER CENTER ONLY)
BASOS PCT: 0 %
Basophils Absolute: 0 10*3/uL (ref 0.0–0.1)
EOS ABS: 0 10*3/uL (ref 0.0–0.5)
Eosinophils Relative: 0 %
HEMATOCRIT: 25.8 % — AB (ref 36.0–46.0)
HEMOGLOBIN: 8.6 g/dL — AB (ref 12.0–15.0)
LYMPHS ABS: 1 10*3/uL (ref 0.7–4.0)
Lymphocytes Relative: 25 %
MCH: 34.4 pg — ABNORMAL HIGH (ref 26.0–34.0)
MCHC: 33.3 g/dL (ref 30.0–36.0)
MCV: 103.2 fL — ABNORMAL HIGH (ref 80.0–100.0)
METAMYELOCYTES PCT: 2 %
MONOS PCT: 0 %
Monocytes Absolute: 0 10*3/uL — ABNORMAL LOW (ref 0.1–1.0)
NEUTROS ABS: 3 10*3/uL (ref 1.7–17.7)
NEUTROS PCT: 73 %
Platelet Count: 192 10*3/uL (ref 150–400)
RBC: 2.5 MIL/uL — ABNORMAL LOW (ref 3.87–5.11)
RDW: 12.3 % (ref 11.5–15.5)
WBC Count: 4 10*3/uL (ref 4.0–10.5)
nRBC: 0 % (ref 0.0–0.2)

## 2018-07-20 LAB — TSH: TSH: 0.798 u[IU]/mL (ref 0.308–3.960)

## 2018-07-20 MED ORDER — SODIUM CHLORIDE 0.9% FLUSH
10.0000 mL | INTRAVENOUS | Status: DC | PRN
  Administered 2018-07-20: 10 mL
  Filled 2018-07-20: qty 10

## 2018-07-20 MED ORDER — HEPARIN SOD (PORK) LOCK FLUSH 100 UNIT/ML IV SOLN
500.0000 [IU] | Freq: Once | INTRAVENOUS | Status: AC | PRN
  Administered 2018-07-20: 500 [IU]
  Filled 2018-07-20: qty 5

## 2018-07-27 ENCOUNTER — Inpatient Hospital Stay: Payer: Medicare Other

## 2018-07-27 ENCOUNTER — Inpatient Hospital Stay (HOSPITAL_BASED_OUTPATIENT_CLINIC_OR_DEPARTMENT_OTHER): Payer: Medicare Other | Admitting: Medical

## 2018-07-27 VITALS — BP 102/78 | HR 107 | Temp 97.9°F | Resp 19 | Ht 63.0 in | Wt 143.5 lb

## 2018-07-27 DIAGNOSIS — Z5111 Encounter for antineoplastic chemotherapy: Secondary | ICD-10-CM

## 2018-07-27 DIAGNOSIS — G894 Chronic pain syndrome: Secondary | ICD-10-CM

## 2018-07-27 DIAGNOSIS — Z5112 Encounter for antineoplastic immunotherapy: Secondary | ICD-10-CM

## 2018-07-27 DIAGNOSIS — M810 Age-related osteoporosis without current pathological fracture: Secondary | ICD-10-CM

## 2018-07-27 DIAGNOSIS — C3411 Malignant neoplasm of upper lobe, right bronchus or lung: Secondary | ICD-10-CM | POA: Diagnosis not present

## 2018-07-27 DIAGNOSIS — C7889 Secondary malignant neoplasm of other digestive organs: Secondary | ICD-10-CM

## 2018-07-27 DIAGNOSIS — C3491 Malignant neoplasm of unspecified part of right bronchus or lung: Secondary | ICD-10-CM

## 2018-07-27 DIAGNOSIS — E785 Hyperlipidemia, unspecified: Secondary | ICD-10-CM

## 2018-07-27 DIAGNOSIS — F17210 Nicotine dependence, cigarettes, uncomplicated: Secondary | ICD-10-CM

## 2018-07-27 DIAGNOSIS — N644 Mastodynia: Secondary | ICD-10-CM

## 2018-07-27 DIAGNOSIS — Z79899 Other long term (current) drug therapy: Secondary | ICD-10-CM

## 2018-07-27 DIAGNOSIS — I10 Essential (primary) hypertension: Secondary | ICD-10-CM

## 2018-07-27 DIAGNOSIS — R5383 Other fatigue: Secondary | ICD-10-CM

## 2018-07-27 DIAGNOSIS — C778 Secondary and unspecified malignant neoplasm of lymph nodes of multiple regions: Secondary | ICD-10-CM

## 2018-07-27 DIAGNOSIS — R531 Weakness: Secondary | ICD-10-CM

## 2018-07-27 DIAGNOSIS — D649 Anemia, unspecified: Secondary | ICD-10-CM

## 2018-07-27 DIAGNOSIS — F329 Major depressive disorder, single episode, unspecified: Secondary | ICD-10-CM

## 2018-07-27 DIAGNOSIS — E876 Hypokalemia: Secondary | ICD-10-CM

## 2018-07-27 DIAGNOSIS — Z7689 Persons encountering health services in other specified circumstances: Secondary | ICD-10-CM | POA: Diagnosis not present

## 2018-07-27 DIAGNOSIS — R630 Anorexia: Secondary | ICD-10-CM

## 2018-07-27 DIAGNOSIS — Z85828 Personal history of other malignant neoplasm of skin: Secondary | ICD-10-CM

## 2018-07-27 DIAGNOSIS — T451X5A Adverse effect of antineoplastic and immunosuppressive drugs, initial encounter: Secondary | ICD-10-CM

## 2018-07-27 DIAGNOSIS — D61810 Antineoplastic chemotherapy induced pancytopenia: Secondary | ICD-10-CM

## 2018-07-27 LAB — CBC WITH DIFFERENTIAL (CANCER CENTER ONLY)
Abs Immature Granulocytes: 3.63 10*3/uL — ABNORMAL HIGH (ref 0.00–0.07)
BASOS PCT: 0 %
Basophils Absolute: 0 10*3/uL (ref 0.0–0.1)
EOS ABS: 0 10*3/uL (ref 0.0–0.5)
EOS PCT: 0 %
HEMATOCRIT: 19.8 % — AB (ref 36.0–46.0)
Hemoglobin: 6.8 g/dL — CL (ref 12.0–15.0)
Immature Granulocytes: 22 %
Lymphocytes Relative: 13 %
Lymphs Abs: 2.2 10*3/uL (ref 0.7–4.0)
MCH: 34.2 pg — AB (ref 26.0–34.0)
MCHC: 34.3 g/dL (ref 30.0–36.0)
MCV: 99.5 fL (ref 80.0–100.0)
MONO ABS: 2.5 10*3/uL — AB (ref 0.1–1.0)
MONOS PCT: 15 %
Neutro Abs: 8.5 10*3/uL — ABNORMAL HIGH (ref 1.7–7.7)
Neutrophils Relative %: 50 %
PLATELETS: 20 10*3/uL — AB (ref 150–400)
RBC: 1.99 MIL/uL — AB (ref 3.87–5.11)
RDW: 12.3 % (ref 11.5–15.5)
WBC: 16.9 10*3/uL — AB (ref 4.0–10.5)
nRBC: 0.4 % — ABNORMAL HIGH (ref 0.0–0.2)

## 2018-07-27 LAB — CMP (CANCER CENTER ONLY)
ALK PHOS: 150 U/L — AB (ref 38–126)
ALT: 53 U/L — ABNORMAL HIGH (ref 0–44)
AST: 50 U/L — AB (ref 15–41)
Albumin: 3.2 g/dL — ABNORMAL LOW (ref 3.5–5.0)
Anion gap: 14 (ref 5–15)
BILIRUBIN TOTAL: 0.2 mg/dL — AB (ref 0.3–1.2)
CALCIUM: 6.8 mg/dL — AB (ref 8.9–10.3)
CO2: 25 mmol/L (ref 22–32)
CREATININE: 0.75 mg/dL (ref 0.44–1.00)
Chloride: 99 mmol/L (ref 98–111)
GFR, Est AFR Am: >60 mL/min (ref >60)
GLUCOSE: 122 mg/dL — AB (ref 70–99)
POTASSIUM: 2.8 mmol/L — AB (ref 3.5–5.1)
Sodium: 138 mmol/L (ref 135–145)
Total Protein: 6.8 g/dL (ref 6.5–8.1)

## 2018-07-27 LAB — ABO/RH: ABO/RH(D): A NEG

## 2018-07-27 MED ORDER — POTASSIUM CHLORIDE CRYS ER 20 MEQ PO TBCR: 40.0000 meq | EXTENDED_RELEASE_TABLET | Freq: Every day | ORAL | 0 refills | Status: DC

## 2018-07-27 MED ORDER — HEPARIN SOD (PORK) LOCK FLUSH 100 UNIT/ML IV SOLN
500.0000 [IU] | Freq: Once | INTRAVENOUS | Status: AC | PRN
  Administered 2018-07-27: 500 [IU]
  Filled 2018-07-27: qty 5

## 2018-07-27 MED ORDER — SODIUM CHLORIDE 0.9% FLUSH
10.0000 mL | INTRAVENOUS | Status: DC | PRN
  Administered 2018-07-27: 10 mL
  Filled 2018-07-27: qty 10

## 2018-07-27 NOTE — Progress Notes (Signed)
[facility]pt[facility] Progress Note   Debra Kidd [id number] [birth year] 57 y.o.  Debra Kidd is managed by Dr. 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  Pt scheduled to receive 2 units PRBCs tomorrow.  2) Extensive SCLC: Pt s/p cycle 3 of carboplatin/etoposied from 07/17/18.  She is scheduled for restaging CT scan on 07/31/18.  She will see Dr. Julien Nordmann on 08/03/18 for follow up.  3) Breast pain, right: Pt was referred for a mammogram to follow up on pain.  4) Hypokalemia: CMP returned today, potassium at 2.8.  Prescription sent for KCL 20 mg, 2 tablets PO daily for 10 days.   Please see After Visit Summary for patient specific instructions.  Future Appointments  Date Time Provider [place]  07/28/2018  8:00 AM [facility] INFUSION [facility] None  07/31/2018  1:00 PM [facility] 2 [facility] [place]  08/03/2018  9:30 AM [facility] LAB 2 [facility] None  08/03/2018  9:45 AM [facility] [place] FLUSH [facility] None  08/03/2018 10:15 AM [name], MD [facility] None  08/03/2018 11:15 AM [facility] INFUSION [facility] None  08/04/2018  9:30 AM [facility] INFUSION [facility] None    08/04/2018 10:30 AM Jennet Maduro, RD [facility] None  08/05/2018  9:30 AM [facility] INFUSION [facility] None  08/07/2018 12:15 PM [facility] [place] FLUSH [facility] None  08/10/2018 11:15 AM [facility] LAB 4 [facility] None  08/10/2018 11:30 AM [facility] [place] FLUSH [facility] None  08/17/2018 11:30 AM [facility] LAB 2 [facility] None  08/17/2018 11:45 AM [facility] [place] FLUSH [facility] None  08/24/2018 10:45 AM [facility] LAB 4 [facility] None  08/24/2018 11:00 AM [facility] [place] FLUSH [facility] None  08/24/2018 11:30 AM [name], MD [facility] None  08/24/2018 12:30 PM [facility] INFUSION [facility] None  08/25/2018  1:15 PM [facility] INFUSION [facility] None  08/27/2018  1:45 PM [facility] INFUSION [facility] None  08/29/2018 11:45 AM [facility] [place] FLUSH [facility] None    Orders Placed This Encounter  Procedures  . MM DIAG BREAST TOMO BILATERAL  . Type and screen       Subjective:   Patient ID:  Debra Kidd is a 57 y.o. (DOB November 07, 1960) female.  Chief Complaint:  Chief Complaint  Patient presents with  . Arm Pain    HPI Debra Kidd is a 57 y.o. Female with extensive stage small cell lunger cancer.  She presents s/p cycle 3 of carboplatin/etoposide dosed on 07/17/18.  She has pain in her right axilla that extends into her right breast.  No redness, swelling, or injury visible.  Denies fever, chills, or night sweats.  Denies changes or pain in her breast or any surgical hx on that arm/breast.  She has had this pain since her last port flush, describes it as burning and stinging and that it is exacerbated by activity.  She denies trauma or  changes in activity.  She presents today with her husband to the clinic and has a CT abdomen/pelvis scheduled for Friday.  Medications: I have reviewed the patient's current medications.  Allergies:  Allergies  Allergen Reactions  . Codeine Nausea And Vomiting  . Motrin [Ibuprofen]     Elevation of BP  . Thiazide-Type  Diuretics     intolerant  . Vicodin [Hydrocodone-Acetaminophen] Nausea And Vomiting    Past Medical History:  Diagnosis Date  . 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 Blood type, Rh positive   . Cancer ([place])   . Chronic pain syndrome   . Depression 08/07/2016  . Encounter for antineoplastic chemotherapy 07/17/2016  . History of external beam radiation therapy 11/19/16-12/02/16   brain 25 Gy in 10 fractions  . Hyperlipidemia   . Hypertension   . Hypertension 08/07/2016  . Osteoporosis     Past Surgical History:  Procedure Laterality Date  . ABDOMINAL HYSTERECTOMY  1981  . APPENDECTOMY     at age 33  . EYE SURGERY     left  . IR GENERIC HISTORICAL  06/03/2016   IR FLUORO GUIDE PORT INSERTION RIGHT 06/03/2016 [facility]  . IR GENERIC HISTORICAL  06/03/2016   IR US GUIDE VASC ACCESS RIGHT 06/03/2016 [facility]  . SKIN CANCER EXCISION     basal cell carcinoma L side of face    Family History  Problem Relation Age of Onset  . Heart disease Mother   . Heart disease Father   . Emphysema Brother   . Breast cancer Maternal Grandmother   . Heart disease Brother     Social History   Socioeconomic History  . Marital status: Married    Spouse name: Not on file  . Number of children: 2  . Years of education: Not on file  . Highest education level: Not on file  Occupational History  . Not on file  Social Needs  . Financial resource strain: Not on file  . Food insecurity:    Worry: Not on file    Inability: Not on file  . Transportation needs:    Medical: Not on file    Non-medical: Not on file  Tobacco Use  . Smoking status: Current Every Day Smoker    Packs/day: 1.00    Years: 43.00    Pack years: 43.00  . Smokeless tobacco: Never Used  Substance and Sexual Activity  . Alcohol use: No    Comment: quit drinking beer 02/2016  . Drug use: Yes    Frequency: 7.0 times per week    Types: Marijuana    Comment: quit beer 3 months ago  . Sexual activity:  Not on file  Lifestyle  . Physical activity:    Days per week: Not on file    Minutes per session: Not on file  . Stress: Not on file  Relationships  . Social connections:    Talks on phone: Not on file    Gets together: Not on file    Attends religious service: Not on file    Active member of club or organization: Not on file    Attends meetings of clubs or organizations: Not on file    Relationship status: Not on file  . Intimate partner violence:    Fear of current or ex partner: Not on file    Emotionally abused: Not on file    Physically abused: Not on file    Forced  sexual activity: Not on file  Other Topics Concern  . 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  Currently on disability    Past Medical History, Surgical history, Social history, and Family history were reviewed and updated as appropriate.   Please see review of systems for further details on the patient's review from today.   Review of Systems:  Review of Systems  Constitutional: Positive for fatigue. Negative for appetite change, chills, diaphoresis and fever.  HENT: Negative for dental problem, mouth sores and trouble swallowing.   Respiratory: Negative for cough, chest tightness and shortness of breath.   Cardiovascular: Negative for chest pain and palpitations.  Gastrointestinal: Negative for constipation, diarrhea, nausea and vomiting.  Skin:       Right axilla and right breast pain.  Neurological: Positive for dizziness. Negative for syncope, weakness and headaches.    Objective:   Physical Exam:  BP 102/78 (BP Location: Left Arm, Patient Position: Sitting)   Pulse (!) 107   Temp 97.9 F (36.6 C) (Oral)   Resp 19   Ht 5\' 3"  (1.6 m)   Wt 143 lb 8 oz (65.1 kg)   SpO2 100%   BMI 25.42 kg/m  ECOG: 0  Physical Exam  Constitutional: No distress.  HENT:  Head: Normocephalic and  atraumatic.  Cardiovascular: Normal rate, regular rhythm and normal heart sounds. Exam reveals no gallop and no friction rub.  No murmur heard.   Pulmonary/Chest: Effort normal and breath sounds normal. No respiratory distress. She has no wheezes. She has no rales.  Neurological: She is alert.  Skin: Skin is warm and dry. No rash noted. She is not diaphoretic. No erythema.    Lab Review:     Component Value Date/Time   NA 138 07/27/2018 0959   NA 141 08/01/2017 0835   K 2.8 (LL) 07/27/2018 0959   K 3.5 08/01/2017 0835   CL 99 07/27/2018 0959   CO2 25 07/27/2018 0959   CO2 25 08/01/2017 0835   GLUCOSE 122 (H) 07/27/2018 0959   GLUCOSE 103 08/01/2017 0835   BUN <4 (L) 07/27/2018 0959   BUN 8.0 08/01/2017 0835   CREATININE 0.75 07/27/2018 0959   CREATININE 0.9 08/01/2017 0835   CALCIUM 6.8 (L) 07/27/2018 0959   CALCIUM 9.9 08/01/2017 0835   PROT 6.8 07/27/2018 0959   PROT 7.8 08/01/2017 0835   ALBUMIN 3.2 (L) 07/27/2018 0959   ALBUMIN 3.9 08/01/2017 0835   AST 50 (H) 07/27/2018 0959   AST 15 08/01/2017 0835   ALT 53 (H) 07/27/2018 0959   ALT 19 08/01/2017 0835   ALKPHOS 150 (H) 07/27/2018 0959   ALKPHOS 142 08/01/2017 0835   BILITOT 0.2 (L) 07/27/2018 0959   BILITOT 0.35 08/01/2017 0835   GFRNONAA >60 07/27/2018 0959   GFRAA >60 07/27/2018 0959       Component Value Date/Time   WBC 16.9 (H) 07/27/2018 0959   WBC 9.1 10/06/2017 1129   RBC 1.99 (L) 07/27/2018 0959   HGB 6.8 (LL) 07/27/2018 0959   HGB 12.6 08/01/2017 0836   HCT 19.8 (L) 07/27/2018 0959   HCT 37.8 08/01/2017 0836   PLT 20 (L) 07/27/2018 0959   PLT 292 08/01/2017 0836   MCV 99.5 07/27/2018 0959   MCV 105.6 (H) 08/01/2017 0836   MCH 34.2 (H) 07/27/2018 0959   MCHC 34.3 07/27/2018 0959   RDW  12.3 07/27/2018 0959   RDW 12.6 08/01/2017 0836   LYMPHSABS 2.2 07/27/2018 0959   LYMPHSABS 2.1 08/01/2017 0836   MONOABS 2.5 (H) 07/27/2018 0959   MONOABS 0.4 08/01/2017 0836   EOSABS 0.0 07/27/2018 0959    EOSABS 0.1 08/01/2017 0836   BASOSABS 0.0 07/27/2018 0959   BASOSABS 0.0 08/01/2017 0836   -------------------------------  Imaging from last 24 hours (if applicable):  Radiology interpretation: No results found.      This case was discussed with Dr. Julien Nordmann. He expressed agreement with my management of this patient.

## 2018-07-27 NOTE — Patient Instructions (Signed)
Implanted Port Home Guide An implanted port is a type of central line that is placed under the skin. Central lines are used to provide IV access when treatment or nutrition needs to be given through a person's veins. Implanted ports are used for long-term IV access. An implanted port may be placed because:  You need IV medicine that would be irritating to the small veins in your hands or arms.  You need long-term IV medicines, such as antibiotics.  You need IV nutrition for a long period.  You need frequent blood draws for lab tests.  You need dialysis.  Implanted ports are usually placed in the chest area, but they can also be placed in the upper arm, the abdomen, or the leg. An implanted port has two main parts:  Reservoir. The reservoir is round and will appear as a small, raised area under your skin. The reservoir is the part where a needle is inserted to give medicines or draw blood.  Catheter. The catheter is a thin, flexible tube that extends from the reservoir. The catheter is placed into a large vein. Medicine that is inserted into the reservoir goes into the catheter and then into the vein.  How will I care for my incision site? Do not get the incision site wet. Bathe or shower as directed by your health care provider. How is my port accessed? Special steps must be taken to access the port:  Before the port is accessed, a numbing cream can be placed on the skin. This helps numb the skin over the port site.  Your health care provider uses a sterile technique to access the port. ? Your health care provider must put on a mask and sterile gloves. ? The skin over your port is cleaned carefully with an antiseptic and allowed to dry. ? The port is gently pinched between sterile gloves, and a needle is inserted into the port.  Only "non-coring" port needles should be used to access the port. Once the port is accessed, a blood return should be checked. This helps ensure that the port  is in the vein and is not clogged.  If your port needs to remain accessed for a constant infusion, a clear (transparent) bandage will be placed over the needle site. The bandage and needle will need to be changed every week, or as directed by your health care provider.  Keep the bandage covering the needle clean and dry. Do not get it wet. Follow your health care provider's instructions on how to take a shower or bath while the port is accessed.  If your port does not need to stay accessed, no bandage is needed over the port.  What is flushing? Flushing helps keep the port from getting clogged. Follow your health care provider's instructions on how and when to flush the port. Ports are usually flushed with saline solution or a medicine called heparin. The need for flushing will depend on how the port is used.  If the port is used for intermittent medicines or blood draws, the port will need to be flushed: ? After medicines have been given. ? After blood has been drawn. ? As part of routine maintenance.  If a constant infusion is running, the port may not need to be flushed.  How long will my port stay implanted? The port can stay in for as long as your health care provider thinks it is needed. When it is time for the port to come out, surgery will be   done to remove it. The procedure is similar to the one performed when the port was put in. When should I seek immediate medical care? When you have an implanted port, you should seek immediate medical care if:  You notice a bad smell coming from the incision site.  You have swelling, redness, or drainage at the incision site.  You have more swelling or pain at the port site or the surrounding area.  You have a fever that is not controlled with medicine.  This information is not intended to replace advice given to you by your health care provider. Make sure you discuss any questions you have with your health care provider. Document  Released: 08/19/2005 Document Revised: 01/25/2016 Document Reviewed: 04/26/2013 Elsevier Interactive Patient Education  2017 Elsevier Inc.  

## 2018-07-27 NOTE — Progress Notes (Signed)
Pt c/o edema and pain on R lateral breast. Sent chat msg to [place], RN working with Dr. Julien Nordmann today. Pt to see Sandi Mealy, PA in symptom management today. Pt made aware and placed in subwait prior to appt.

## 2018-07-27 NOTE — Progress Notes (Signed)
Pt presents today with R upper arm pain that radiates into armpit.  No redness, swelling, heat, streaking, or injury present.  Denies surgical hx on that side of the body.  No breast issues on that side.  Afebrile.  Pain w/movement, improved at rest.

## 2018-07-27 NOTE — Patient Instructions (Signed)
Anemia Anemia is a condition in which you do not have enough red blood cells or hemoglobin. Hemoglobin is a substance in red blood cells that carries oxygen. When you do not have enough red blood cells or hemoglobin (are anemic), your body cannot get enough oxygen and your organs may not work properly. As a result, you may feel very tired or have other problems. What are the causes? Common causes of anemia include:  Excessive bleeding. Anemia can be caused by excessive bleeding inside or outside the body, including bleeding from the intestine or from periods in women.  Poor nutrition.  Long-lasting (chronic) kidney, thyroid, and liver disease.  Bone marrow disorders.  Cancer and treatments for cancer.  HIV (human immunodeficiency virus) and AIDS (acquired immunodeficiency syndrome).  Treatments for HIV and AIDS.  Spleen problems.  Blood disorders.  Infections, medicines, and autoimmune disorders that destroy red blood cells.  What are the signs or symptoms? Symptoms of this condition include:  Minor weakness.  Dizziness.  Headache.  Feeling heartbeats that are irregular or faster than normal (palpitations).  Shortness of breath, especially with exercise.  Paleness.  Cold sensitivity.  Indigestion.  Nausea.  Difficulty sleeping.  Difficulty concentrating.  Symptoms may occur suddenly or develop slowly. If your anemia is mild, you may not have symptoms. How is this diagnosed? This condition is diagnosed based on:  Blood tests.  Your medical history.  A physical exam.  Bone marrow biopsy.  Your health care provider may also check your stool (feces) for blood and may do additional testing to look for the cause of your bleeding. You may also have other tests, including:  Imaging tests, such as a CT scan or MRI.  Endoscopy.  Colonoscopy.  How is this treated? Treatment for this condition depends on the cause. If you continue to lose a lot of blood,  you may need to be treated at a hospital. Treatment may include:  Taking supplements of iron, vitamin B12, or folic acid.  Taking a hormone medicine (erythropoietin) that can help to stimulate red blood cell growth.  Having a blood transfusion. This may be needed if you lose a lot of blood.  Making changes to your diet.  Having surgery to remove your spleen.  Follow these instructions at home:  Take over-the-counter and prescription medicines only as told by your health care provider.  Take supplements only as told by your health care provider.  Follow any diet instructions that you were given.  Keep all follow-up visits as told by your health care provider. This is important. Contact a health care provider if:  You develop new bleeding anywhere in the body. Get help right away if:  You are very weak.  You are short of breath.  You have pain in your abdomen or chest.  You are dizzy or feel faint.  You have trouble concentrating.  You have bloody or black, tarry stools.  You vomit repeatedly or you vomit up blood. Summary  Anemia is a condition in which you do not have enough red blood cells or enough of a substance in your red blood cells that carries oxygen (hemoglobin).  Symptoms may occur suddenly or develop slowly.  If your anemia is mild, you may not have symptoms.  This condition is diagnosed with blood tests as well as a medical history and physical exam. Other tests may be needed.  Treatment for this condition depends on the cause of the anemia. This information is not intended to replace advice   given to you by your health care provider. Make sure you discuss any questions you have with your health care provider. Document Released: 09/26/2004 Document Revised: 09/20/2016 Document Reviewed: 09/20/2016 Elsevier Interactive Patient Education  [employer].

## 2018-07-28 ENCOUNTER — Telehealth: Payer: Self-pay | Admitting: Medical

## 2018-07-28 ENCOUNTER — Inpatient Hospital Stay: Payer: Medicare Other

## 2018-07-28 VITALS — BP 108/78 | HR 91 | Temp 98.5°F | Resp 16

## 2018-07-28 DIAGNOSIS — D649 Anemia, unspecified: Secondary | ICD-10-CM

## 2018-07-28 DIAGNOSIS — Z5111 Encounter for antineoplastic chemotherapy: Secondary | ICD-10-CM | POA: Diagnosis not present

## 2018-07-28 DIAGNOSIS — C3491 Malignant neoplasm of unspecified part of right bronchus or lung: Secondary | ICD-10-CM

## 2018-07-28 LAB — PREPARE RBC (CROSSMATCH)

## 2018-07-28 MED ORDER — HEPARIN SOD (PORK) LOCK FLUSH 100 UNIT/ML IV SOLN
500.0000 [IU] | Freq: Every day | INTRAVENOUS | Status: AC | PRN
  Administered 2018-07-28: 500 [IU]
  Filled 2018-07-28: qty 5

## 2018-07-28 MED ORDER — DIPHENHYDRAMINE HCL 25 MG PO CAPS
ORAL_CAPSULE | ORAL | Status: AC
  Filled 2018-07-28: qty 1

## 2018-07-28 MED ORDER — ACETAMINOPHEN 325 MG PO TABS
650.0000 mg | ORAL_TABLET | Freq: Once | ORAL | Status: AC
  Administered 2018-07-28: 650 mg via ORAL

## 2018-07-28 MED ORDER — DIPHENHYDRAMINE HCL 25 MG PO CAPS
25.0000 mg | ORAL_CAPSULE | Freq: Once | ORAL | Status: AC
  Administered 2018-07-28: 25 mg via ORAL

## 2018-07-28 MED ORDER — SODIUM CHLORIDE 0.9% FLUSH
10.0000 mL | INTRAVENOUS | Status: AC | PRN
  Administered 2018-07-28: 10 mL
  Filled 2018-07-28: qty 10

## 2018-07-28 MED ORDER — ACETAMINOPHEN 325 MG PO TABS
ORAL_TABLET | ORAL | Status: AC
  Filled 2018-07-28: qty 2

## 2018-07-28 MED ORDER — SODIUM CHLORIDE 0.9 % IV SOLN
INTRAVENOUS | Status: DC
  Administered 2018-07-28: 09:00:00 via INTRAVENOUS
  Filled 2018-07-28: qty 250

## 2018-07-28 NOTE — Telephone Encounter (Signed)
Per 11/25 done

## 2018-07-28 NOTE — Patient Instructions (Signed)

## 2018-07-28 NOTE — Progress Notes (Signed)
These preliminary result these preliminary results were noted.  Awaiting final report.

## 2018-07-29 LAB — BPAM RBC
BLOOD PRODUCT EXPIRATION DATE: 201912172359
Blood Product Expiration Date: 201912172359
ISSUE DATE / TIME: 201911260833
ISSUE DATE / TIME: 201911260833
UNIT TYPE AND RH: 600
UNIT TYPE AND RH: 600

## 2018-07-29 LAB — TYPE AND SCREEN
ABO/RH(D): A NEG
Antibody Screen: NEGATIVE
UNIT DIVISION: 0
Unit division: 0

## 2018-07-31 ENCOUNTER — Encounter (HOSPITAL_COMMUNITY): Payer: Self-pay

## 2018-07-31 ENCOUNTER — Ambulatory Visit (HOSPITAL_COMMUNITY)
Admission: RE | Admit: 2018-07-31 | Discharge: 2018-07-31 | Disposition: A | Discharge status: Home / Self Care | Payer: Medicare Other | Source: Ambulatory Visit | Attending: Internal Medicine | Admitting: Internal Medicine

## 2018-07-31 DIAGNOSIS — C3491 Malignant neoplasm of unspecified part of right bronchus or lung: Secondary | ICD-10-CM | POA: Diagnosis not present

## 2018-07-31 MED ORDER — IOPAMIDOL (ISOVUE-300) INJECTION 61%
100.0000 mL | Freq: Once | INTRAVENOUS | Status: AC | PRN
  Administered 2018-07-31: 100 mL via INTRAVENOUS

## 2018-07-31 MED ORDER — HEPARIN SOD (PORK) LOCK FLUSH 100 UNIT/ML IV SOLN
500.0000 [IU] | Freq: Once | INTRAVENOUS | Status: AC
  Administered 2018-07-31: 500 [IU] via INTRAVENOUS

## 2018-07-31 MED ORDER — HEPARIN SOD (PORK) LOCK FLUSH 100 UNIT/ML IV SOLN
INTRAVENOUS | Status: AC
  Administered 2018-07-31: 500 [IU] via INTRAVENOUS
  Filled 2018-07-31: qty 5

## 2018-07-31 MED ORDER — SODIUM CHLORIDE (PF) 0.9 % IJ SOLN
INTRAMUSCULAR | Status: AC
  Filled 2018-07-31: qty 50

## 2018-08-03 ENCOUNTER — Inpatient Hospital Stay (HOSPITAL_BASED_OUTPATIENT_CLINIC_OR_DEPARTMENT_OTHER): Payer: Medicare Other | Admitting: Internal Medicine

## 2018-08-03 ENCOUNTER — Encounter: Payer: Self-pay | Admitting: Internal Medicine

## 2018-08-03 ENCOUNTER — Other Ambulatory Visit: Payer: Self-pay | Admitting: *Deleted

## 2018-08-03 ENCOUNTER — Inpatient Hospital Stay: Payer: Medicare Other

## 2018-08-03 ENCOUNTER — Telehealth: Payer: Self-pay | Admitting: Internal Medicine

## 2018-08-03 ENCOUNTER — Other Ambulatory Visit: Payer: Self-pay | Admitting: Internal Medicine

## 2018-08-03 ENCOUNTER — Inpatient Hospital Stay: Payer: Medicare Other | Attending: Internal Medicine

## 2018-08-03 VITALS — BP 113/80 | HR 98 | Temp 98.8°F | Resp 17 | Ht 63.0 in | Wt 141.8 lb

## 2018-08-03 DIAGNOSIS — C3411 Malignant neoplasm of upper lobe, right bronchus or lung: Secondary | ICD-10-CM | POA: Insufficient documentation

## 2018-08-03 DIAGNOSIS — I10 Essential (primary) hypertension: Secondary | ICD-10-CM | POA: Insufficient documentation

## 2018-08-03 DIAGNOSIS — Z85828 Personal history of other malignant neoplasm of skin: Secondary | ICD-10-CM

## 2018-08-03 DIAGNOSIS — Z79899 Other long term (current) drug therapy: Secondary | ICD-10-CM | POA: Insufficient documentation

## 2018-08-03 DIAGNOSIS — E785 Hyperlipidemia, unspecified: Secondary | ICD-10-CM | POA: Diagnosis not present

## 2018-08-03 DIAGNOSIS — K449 Diaphragmatic hernia without obstruction or gangrene: Secondary | ICD-10-CM

## 2018-08-03 DIAGNOSIS — M810 Age-related osteoporosis without current pathological fracture: Secondary | ICD-10-CM | POA: Insufficient documentation

## 2018-08-03 DIAGNOSIS — T451X5A Adverse effect of antineoplastic and immunosuppressive drugs, initial encounter: Secondary | ICD-10-CM | POA: Insufficient documentation

## 2018-08-03 DIAGNOSIS — Z7689 Persons encountering health services in other specified circumstances: Secondary | ICD-10-CM | POA: Diagnosis not present

## 2018-08-03 DIAGNOSIS — Z923 Personal history of irradiation: Secondary | ICD-10-CM | POA: Diagnosis not present

## 2018-08-03 DIAGNOSIS — G894 Chronic pain syndrome: Secondary | ICD-10-CM | POA: Insufficient documentation

## 2018-08-03 DIAGNOSIS — C7889 Secondary malignant neoplasm of other digestive organs: Secondary | ICD-10-CM | POA: Insufficient documentation

## 2018-08-03 DIAGNOSIS — C3491 Malignant neoplasm of unspecified part of right bronchus or lung: Secondary | ICD-10-CM

## 2018-08-03 DIAGNOSIS — C778 Secondary and unspecified malignant neoplasm of lymph nodes of multiple regions: Secondary | ICD-10-CM | POA: Diagnosis not present

## 2018-08-03 DIAGNOSIS — R5383 Other fatigue: Secondary | ICD-10-CM | POA: Insufficient documentation

## 2018-08-03 DIAGNOSIS — Z5111 Encounter for antineoplastic chemotherapy: Secondary | ICD-10-CM | POA: Insufficient documentation

## 2018-08-03 DIAGNOSIS — D6481 Anemia due to antineoplastic chemotherapy: Secondary | ICD-10-CM | POA: Diagnosis not present

## 2018-08-03 DIAGNOSIS — Z5112 Encounter for antineoplastic immunotherapy: Secondary | ICD-10-CM | POA: Diagnosis not present

## 2018-08-03 LAB — CBC WITH DIFFERENTIAL (CANCER CENTER ONLY)
Abs Immature Granulocytes: 0.27 10*3/uL — ABNORMAL HIGH (ref 0.00–0.07)
Basophils Absolute: 0 10*3/uL (ref 0.0–0.1)
Basophils Relative: 0 %
Eosinophils Absolute: 0 10*3/uL (ref 0.0–0.5)
Eosinophils Relative: 0 %
HCT: 30.8 % — ABNORMAL LOW (ref 36.0–46.0)
Hemoglobin: 10.1 g/dL — ABNORMAL LOW (ref 12.0–15.0)
Immature Granulocytes: 2 %
LYMPHS PCT: 12 %
Lymphs Abs: 1.5 10*3/uL (ref 0.7–4.0)
MCH: 31.7 pg (ref 26.0–34.0)
MCHC: 32.8 g/dL (ref 30.0–36.0)
MCV: 96.6 fL (ref 80.0–100.0)
Monocytes Absolute: 1.6 10*3/uL — ABNORMAL HIGH (ref 0.1–1.0)
Monocytes Relative: 13 %
Neutro Abs: 9 10*3/uL — ABNORMAL HIGH (ref 1.7–7.7)
Neutrophils Relative %: 73 %
Platelet Count: 231 10*3/uL (ref 150–400)
RBC: 3.19 MIL/uL — AB (ref 3.87–5.11)
RDW: 15.8 % — ABNORMAL HIGH (ref 11.5–15.5)
WBC: 12.4 10*3/uL — AB (ref 4.0–10.5)
nRBC: 0 % (ref 0.0–0.2)

## 2018-08-03 LAB — CMP (CANCER CENTER ONLY)
ALT: 26 U/L (ref 0–44)
AST: 23 U/L (ref 15–41)
Albumin: 3.1 g/dL — ABNORMAL LOW (ref 3.5–5.0)
Alkaline Phosphatase: 174 U/L — ABNORMAL HIGH (ref 38–126)
Anion gap: 13 (ref 5–15)
BILIRUBIN TOTAL: 0.5 mg/dL (ref 0.3–1.2)
BUN: <4 mg/dL — ABNORMAL LOW (ref 6–20)
CO2: 25 mmol/L (ref 22–32)
Calcium: 5.8 mg/dL — CL (ref 8.9–10.3)
Chloride: 104 mmol/L (ref 98–111)
Creatinine: 0.74 mg/dL (ref 0.44–1.00)
GFR, Est AFR Am: >60 mL/min (ref >60)
GFR, Estimated: >60 mL/min (ref >60)
Glucose, Bld: 117 mg/dL — ABNORMAL HIGH (ref 70–99)
POTASSIUM: 3 mmol/L — AB (ref 3.5–5.1)
Sodium: 142 mmol/L (ref 135–145)
TOTAL PROTEIN: 6.8 g/dL (ref 6.5–8.1)

## 2018-08-03 MED ORDER — HEPARIN SOD (PORK) LOCK FLUSH 100 UNIT/ML IV SOLN
500.0000 [IU] | Freq: Once | INTRAVENOUS | Status: DC | PRN
  Filled 2018-08-03: qty 5

## 2018-08-03 MED ORDER — SODIUM CHLORIDE 0.9 % IV SOLN
1200.0000 mg | Freq: Once | INTRAVENOUS | Status: AC
  Administered 2018-08-03: 1200 mg via INTRAVENOUS
  Filled 2018-08-03: qty 20

## 2018-08-03 MED ORDER — FAMOTIDINE IN NACL 20-0.9 MG/50ML-% IV SOLN
20.0000 mg | Freq: Once | INTRAVENOUS | Status: AC
  Administered 2018-08-03: 20 mg via INTRAVENOUS

## 2018-08-03 MED ORDER — POTASSIUM CHLORIDE ER 20 MEQ PO TBCR: 20.0000 meq | EXTENDED_RELEASE_TABLET | Freq: Every day | ORAL | 2 refills | Status: DC

## 2018-08-03 MED ORDER — SODIUM CHLORIDE 0.9 % IV SOLN
Freq: Once | INTRAVENOUS | Status: AC
  Administered 2018-08-03: 13:00:00 via INTRAVENOUS
  Filled 2018-08-03: qty 5

## 2018-08-03 MED ORDER — SODIUM CHLORIDE 0.9 % IV SOLN
Freq: Once | INTRAVENOUS | Status: AC
  Administered 2018-08-03: 12:00:00 via INTRAVENOUS
  Filled 2018-08-03: qty 250

## 2018-08-03 MED ORDER — DIPHENHYDRAMINE HCL 50 MG/ML IJ SOLN
25.0000 mg | Freq: Once | INTRAMUSCULAR | Status: AC
  Administered 2018-08-03: 25 mg via INTRAVENOUS

## 2018-08-03 MED ORDER — PALONOSETRON HCL INJECTION 0.25 MG/5ML
0.2500 mg | Freq: Once | INTRAVENOUS | Status: AC
  Administered 2018-08-03: 0.25 mg via INTRAVENOUS

## 2018-08-03 MED ORDER — SODIUM CHLORIDE 0.9 % IV SOLN
2.0000 g | Freq: Once | INTRAVENOUS | Status: AC
  Administered 2018-08-03: 2 g via INTRAVENOUS
  Filled 2018-08-03: qty 20

## 2018-08-03 MED ORDER — FAMOTIDINE IN NACL 20-0.9 MG/50ML-% IV SOLN
INTRAVENOUS | Status: AC
  Filled 2018-08-03: qty 50

## 2018-08-03 MED ORDER — PALONOSETRON HCL INJECTION 0.25 MG/5ML
INTRAVENOUS | Status: AC
  Filled 2018-08-03: qty 5

## 2018-08-03 MED ORDER — DIPHENHYDRAMINE HCL 50 MG/ML IJ SOLN
INTRAMUSCULAR | Status: AC
  Filled 2018-08-03: qty 1

## 2018-08-03 MED ORDER — SODIUM CHLORIDE 0.9 % IV SOLN
2.0000 g | Freq: Once | INTRAVENOUS | Status: DC
  Filled 2018-08-03: qty 20

## 2018-08-03 MED ORDER — SODIUM CHLORIDE 0.9 % IV SOLN
435.2000 mg | Freq: Once | INTRAVENOUS | Status: AC
  Administered 2018-08-03: 440 mg via INTRAVENOUS
  Filled 2018-08-03: qty 44

## 2018-08-03 MED ORDER — SODIUM CHLORIDE 0.9 % IV SOLN
80.0000 mg/m2 | Freq: Once | INTRAVENOUS | Status: AC
  Administered 2018-08-03: 140 mg via INTRAVENOUS
  Filled 2018-08-03: qty 7

## 2018-08-03 MED ORDER — SODIUM CHLORIDE 0.9% FLUSH
10.0000 mL | INTRAVENOUS | Status: DC | PRN
  Administered 2018-08-03: 10 mL
  Filled 2018-08-03: qty 10

## 2018-08-03 MED ORDER — SODIUM CHLORIDE 0.9% FLUSH
10.0000 mL | INTRAVENOUS | Status: DC | PRN
  Filled 2018-08-03: qty 10

## 2018-08-03 MED ORDER — FAMOTIDINE IN NACL 20-0.9 MG/50ML-% IV SOLN: 20.0000 mg | Freq: Once | INTRAVENOUS | Status: DC

## 2018-08-03 MED FILL — POTASSIUM CL 10% (20 MEQ/15: 20 MEQ/15ML | 30 days supply | Qty: 900 | Fill #0

## 2018-08-03 NOTE — Patient Instructions (Addendum)
[facility] Discharge Instructions for Patients Receiving Chemotherapy  Today you received the following chemotherapy agents:  Tecentriq, Etoposide, and Carboplatin.  To help prevent nausea and vomiting after your treatment, we encourage you to take your nausea medication as directed.   If you develop nausea and vomiting that is not controlled by your nausea medication, call the clinic.   BELOW ARE SYMPTOMS THAT SHOULD BE REPORTED IMMEDIATELY:  *FEVER GREATER THAN 100.5 F  *CHILLS WITH OR WITHOUT FEVER  NAUSEA AND VOMITING THAT IS NOT CONTROLLED WITH YOUR NAUSEA MEDICATION  *UNUSUAL SHORTNESS OF BREATH  *UNUSUAL BRUISING OR BLEEDING  TENDERNESS IN MOUTH AND THROAT WITH OR WITHOUT PRESENCE OF ULCERS  *URINARY PROBLEMS  *BOWEL PROBLEMS  UNUSUAL RASH Items with * indicate a potential emergency and should be followed up as soon as possible.  Feel free to call the clinic should you have any questions or concerns. The clinic phone number is [phone].  Please show the [facility] at check-in to the Emergency Department and triage nurse.  Hypocalcemia, Adult Hypocalcemia is when the level of calcium in a person's blood is below normal. Calcium is a mineral that is used by the body in many ways. A lack of blood calcium can affect the heart and muscles, make the bones more likely to break, and cause other problems. What are the causes? This condition may be caused by: Decreased production (hypoparathyroidism) or improper use of parathyroid hormone. Problems with the parathyroid glands or surgical removal of these glands. Problems with parathyroid function after removal of the thyroid gland. Lack (deficiency) of vitamin D or magnesium or both. Kidney problems.  Less common causes include: Intestinal problems that interfere with nutrient absorption. Alcoholism. Low levels of a body protein that is called albumin. Inflammation of the pancreas  (pancreatitis). Certain medicines. Severe infections (sepsis). Certain diseases, such as sarcoidosis or hemochromatosis, that cause the parathyroid glands to be filled with cells or substances that are not normally present. Breakdown of large amounts of muscle fiber. High levels of phosphate in the body. Cancer. Massive blood transfusions, which usually occur with severe trauma.  What are the signs or symptoms? Symptoms of this condition include: Numbness and tingling in the fingers, toes, or around the mouth. Muscle aches or cramps, especially in the legs, feet, and back. Muscle twitches. Abdominal cramping or pain. Memory problems, confusion, or difficulty thinking. Depression, anxiety, irritability, or changes in personality. Fainting. Chest pain. Difficulty swallowing. Changes in the sound of the voice. Shortness of breath or wheezing. General weakness and fatigue.  Symptoms of severe hypocalcemia include: Shaking uncontrollably (seizures). Seizure of the voice box (laryngospasm). Fast heartbeats (palpitations) and abnormal heart rhythms (arrhythmias).  Long-term symptoms of this condition include: Coarse, brittle hair and nails. Dry skin or lasting (chronic) skin diseases (psoriasis, eczema,, or dermatitis). Clouding of the eye lens (cataracts).  How is this diagnosed? This condition is usually diagnosed with a blood test. You may also have other tests to help determine the underlying cause of the condition. For example, a test may be done that records the electrical activity of the heart (electrocardiogram,or ECG). How is this treated? Treatment for this condition may include: Calcium given by mouth (orally) or given through an IV tube that is inserted into one of your veins. The method used for giving calcium will depend on the severity of the condition. Other minerals (electrolytes), such as magnesium.  Other treatment will depend on the cause of the  condition. Follow these  instructions at home: Follow diet instructions from your health care provider or dietitian. Take supplements only as told by your health care provider. Keep all follow-up visits as told by your health care provider. This is important. Contact a health care provider if: You have increased fatigue. You have increased muscle twitching. You have new swelling in the feet, ankles, or legs. You develop changes in mood, memory, or personality. Get help right away if: You have chest pain. You have persistent rapid or irregular heartbeats. You have difficulty breathing. You faint. You start to have seizures. You have confusion. This information is not intended to replace advice given to you by your health care provider. Make sure you discuss any questions you have with your health care provider. Document Released: 02/06/2010 Document Revised: 01/25/2016 Document Reviewed: 01/04/2015 Elsevier Interactive Patient Education  [employer].

## 2018-08-03 NOTE — Progress Notes (Signed)
In review of today's CMP, noted significant hypokalemia and hypocalcemia. Dr. Julien Nordmann aware and advised to proceed with treatment as ordered. No new orders received.

## 2018-08-03 NOTE — Progress Notes (Signed)
[facility] [phone]   [phone]  OFFICE PROGRESS NOTE  [name], MD [address] [place] [address]  DIAGNOSIS: Extensive stage (T2a, N3, [name], large right anterior mediastinal and supraclavicular lymphadenopathy as well as pancreatic and splenic metastasis diagnosed in September 2017.  The patient had disease progression in October 2019.  PRIOR THERAPY:  1) Systemic chemotherapy was carboplatin for AUC of 5 on day 1 and etoposide 100 MG/M2 on days 1, 2 and 3 with Neulasta support. Status post 6 cycles with significant response of her disease. 2) Prophylactic cranial irradiation under the care of Dr. Sondra Come on 12/02/2016. 3) stereotactic radiotherapy to the recurrent right upper lobe pulmonary nodule under the care of Dr. Sondra Come completed November 10, 2017.  CURRENT THERAPY: Rate treatment with systemic chemotherapy with carboplatin for AUC of 5 on day 1 and etoposide 100 mg/M2 on days 1, 2 and 3 as well as Tecentriq (Atezolizumab) 1200 mg IV every 3 weeks with Neulasta support.  First dose June 22, 2018 for disease recurrence.  Status post 2 cycles.  Starting from cycle #2 her dose of carboplatin will be reduced to AUC of 4 and etoposide 80 mg/M2 on days 1, 2 and 3 in addition to the regular dose of Tecentriq.  INTERVAL HISTORY: Debra Kidd 57 y.o. female returns to the clinic today for follow-up visit accompanied by her husband.  The patient is feeling fine today with no specific complaints except for tenderness in the right breast area.  The patient mentioned that she has some fluid oozing from that area but on exam there was no broken skin or oozing fluids.  She denied having any chest pain, shortness of breath, cough or hemoptysis.  She denied having any nausea, vomiting, diarrhea or constipation.  She has no recent weight loss or night sweats.  She has no headache or visual  changes.  She continues to tolerate her treatment with chemotherapy fairly well except for fatigue.  The patient had repeat CT scan of the chest, abdomen and pelvis performed recently and she is here for evaluation and discussion of her risk her results.  MEDICAL HISTORY: Past Medical History:  Diagnosis Date  . 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 Blood type, Rh positive   . Cancer ([place])   . Chronic pain syndrome   . Depression 08/07/2016  . Encounter for antineoplastic chemotherapy 07/17/2016  . History of external beam radiation therapy 11/19/16-12/02/16   brain 25 Gy in 10 fractions  . Hyperlipidemia   . Hypertension   . Hypertension 08/07/2016  . Osteoporosis     ALLERGIES:  is allergic to codeine; motrin [ibuprofen]; thiazide-type diuretics; and vicodin [hydrocodone-acetaminophen].  MEDICATIONS:  Current Outpatient Medications  Medication Sig Dispense Refill  . celecoxib (CELEBREX) 200 MG capsule Take 200 mg by mouth daily.    . diazepam (VALIUM) 5 MG tablet Take 5 mg by mouth 4 (four) times daily as needed.  2  . diphenhydrAMINE (BENADRYL) 25 MG tablet Take 25 mg by mouth every 6 (six) hours as needed.    . lidocaine-prilocaine (EMLA) cream Apply 1 application topically as needed. 30 g 0  . loperamide (IMODIUM A-D) 2 MG tablet Take 2 mg by mouth 4 (four) times daily as needed for diarrhea or loose stools.    . magic mouthwash SOLN Take 5 mLs by  mouth 4 (four) times daily. 1:1:1 mix 240 mL 0  . Mouthwash Compounding Base (MOUTH WASH-GP) LIQD Magic mouth wash  0  . potassium chloride 20 MEQ TBCR Take 10 mEq by mouth daily. 7 tablet 0  . potassium chloride SA (K-DUR,KLOR-CON) 20 MEQ tablet Take 2 tablets (40 mEq total) by mouth daily. 20 tablet 0  . prochlorperazine (COMPAZINE) 10 MG tablet Take 1 tablet (10 mg total) by mouth every 6 (six) hours as needed for nausea or vomiting. 30 tablet 0  . traMADol (ULTRAM) 50 MG tablet Take by mouth every 6 (six) hours as needed.      No current facility-administered medications for this visit.     SURGICAL HISTORY:  Past Surgical History:  Procedure Laterality Date  . ABDOMINAL HYSTERECTOMY  1981  . APPENDECTOMY     at age 45  . EYE SURGERY     left  . IR GENERIC HISTORICAL  06/03/2016   IR FLUORO GUIDE PORT INSERTION RIGHT 06/03/2016 [facility]  . IR GENERIC HISTORICAL  06/03/2016   IR US GUIDE VASC ACCESS RIGHT 06/03/2016 [facility]  . SKIN CANCER EXCISION     basal cell carcinoma L side of face    REVIEW OF SYSTEMS:  Constitutional: positive for fatigue Eyes: negative Ears, nose, mouth, throat, and face: negative Respiratory: positive for dyspnea on exertion Cardiovascular: negative Gastrointestinal: negative Genitourinary:negative Integument/breast: negative Hematologic/lymphatic: negative Musculoskeletal:negative Neurological: negative Behavioral/Psych: negative Endocrine: negative Allergic/Immunologic: negative   PHYSICAL EXAMINATION: General appearance: alert, cooperative, fatigued and no distress Head: Normocephalic, without obvious abnormality, atraumatic Neck: no adenopathy, no JVD, supple, symmetrical, trachea midline and thyroid not enlarged, symmetric, no tenderness/mass/nodules Lymph nodes: Cervical, supraclavicular, and axillary nodes normal. Resp: clear to auscultation bilaterally Back: symmetric, no curvature. ROM normal. No CVA tenderness. Cardio: regular rate and rhythm, S1, S2 normal, no murmur, click, rub or gallop GI: soft, non-tender; bowel sounds normal; no masses,  no organomegaly Extremities: extremities normal, atraumatic, no cyanosis or edema Neurologic: Alert and oriented X 3, normal strength and tone. Normal symmetric reflexes. Normal coordination and gait  ECOG PERFORMANCE STATUS: 1 - Symptomatic but completely ambulatory  Blood pressure 113/80, pulse 98, temperature 98.8 F (37.1 C), temperature source Oral, resp. rate 17, height 5\' 3"  (1.6 m), weight 141 lb  12.8 oz (64.3 kg), SpO2 97 %.  LABORATORY DATA: Lab Results  Component Value Date   WBC 12.4 (H) 08/03/2018   HGB 10.1 (L) 08/03/2018   HCT 30.8 (L) 08/03/2018   MCV 96.6 08/03/2018   PLT 231 08/03/2018      Chemistry      Component Value Date/Time   NA 138 07/27/2018 0959   NA 141 08/01/2017 0835   K 2.8 (LL) 07/27/2018 0959   K 3.5 08/01/2017 0835   CL 99 07/27/2018 0959   CO2 25 07/27/2018 0959   CO2 25 08/01/2017 0835   BUN <4 (L) 07/27/2018 0959   BUN 8.0 08/01/2017 0835   CREATININE 0.75 07/27/2018 0959   CREATININE 0.9 08/01/2017 0835      Component Value Date/Time   CALCIUM 6.8 (L) 07/27/2018 0959   CALCIUM 9.9 08/01/2017 0835   ALKPHOS 150 (H) 07/27/2018 0959   ALKPHOS 142 08/01/2017 0835   AST 50 (H) 07/27/2018 0959   AST 15 08/01/2017 0835   ALT 53 (H) 07/27/2018 0959   ALT 19 08/01/2017 0835   BILITOT 0.2 (L) 07/27/2018 0959   BILITOT 0.35 08/01/2017 0835       RADIOGRAPHIC STUDIES:  Ct Chest W Contrast  Result Date: 07/31/2018 CLINICAL DATA:  Patient with history of small cell lung cancer. EXAM: CT CHEST, ABDOMEN, AND PELVIS WITH CONTRAST TECHNIQUE: Multidetector CT imaging of the chest, abdomen and pelvis was performed following the standard protocol during bolus administration of intravenous contrast. CONTRAST:  161mL ISOVUE-300 IOPAMIDOL (ISOVUE-300) INJECTION 61% COMPARISON:  CT CAP 06/09/2018 FINDINGS: CT CHEST FINDINGS Cardiovascular: Right anterior chest wall Port-A-Cath is present with tip terminating in the superior vena cava. Normal heart size. Trace fluid superior pericardial recess. Thoracic aortic vascular calcifications. Mediastinum/Nodes: No enlarged axillary, mediastinal or hilar lymphadenopathy. Small hiatal hernia. Lungs/Pleura: Central airways are patent. Dependent atelectasis/scarring within the bilateral lower lobes. Similar-appearing nodule within the right upper lobe measuring 1.9 x 1.4 cm (image 24; series 4). Similar-appearing  surrounding ground-glass attenuation and architectural distortion compatible with post radiation changes. No pleural effusion or pneumothorax. Musculoskeletal: No aggressive or acute appearing osseous lesions. CT ABDOMEN PELVIS FINDINGS Hepatobiliary: Liver is normal in size and contour. Similar-appearing low-attenuation adjacent to the falciform ligament, potentially fatty deposition or perfusion anomaly. Gallbladder is unremarkable. No intrahepatic or extrahepatic biliary ductal dilatation. Pancreas: Unremarkable Spleen: Unremarkable Adrenals/Urinary Tract: Normal adrenal glands. Kidneys enhance symmetrically with contrast. No hydronephrosis. Urinary bladder is unremarkable. Stomach/Bowel: Slight interval decrease in pericolonic mesenteric fat stranding (image 99; series 2). Interval decrease in fat stranding anterior to the right iliacus musculature (image 84; series 2). There is interval decrease in small bowel mesenteric fat stranding/haziness (image 77; series 2) with the area measuring approximately 7.6 x 3.2 cm, previously 10.6 x 4.0 cm. Vascular/Lymphatic: Normal caliber abdominal aorta. Peripheral calcified noncalcified atherosclerotic plaque. No retroperitoneal lymphadenopathy. Reproductive: Status post hysterectomy. Other: None. Musculoskeletal: No aggressive or acute appearing osseous lesions. Lumbar spine degenerative changes. No aggressive or acute appearing osseous lesions. Unchanged mild height loss of the L1 vertebral body. IMPRESSION: 1. Interval improvement in previously described mesenteric fat stranding and nodularity within the sigmoid mesocolon in along the anterior aspect of the right iliacus musculature. Improved fat stranding within the small bowel mesentery. 2. Similar-appearing nodularity within the right upper lobe near the apex. Electronically Signed   By: Lovey Newcomer M.D.   On: 07/31/2018 14:03   Ct Abdomen Pelvis W Contrast  Result Date: 07/31/2018 CLINICAL DATA:  Patient with  history of small cell lung cancer. EXAM: CT CHEST, ABDOMEN, AND PELVIS WITH CONTRAST TECHNIQUE: Multidetector CT imaging of the chest, abdomen and pelvis was performed following the standard protocol during bolus administration of intravenous contrast. CONTRAST:  175mL ISOVUE-300 IOPAMIDOL (ISOVUE-300) INJECTION 61% COMPARISON:  CT CAP 06/09/2018 FINDINGS: CT CHEST FINDINGS Cardiovascular: Right anterior chest wall Port-A-Cath is present with tip terminating in the superior vena cava. Normal heart size. Trace fluid superior pericardial recess. Thoracic aortic vascular calcifications. Mediastinum/Nodes: No enlarged axillary, mediastinal or hilar lymphadenopathy. Small hiatal hernia. Lungs/Pleura: Central airways are patent. Dependent atelectasis/scarring within the bilateral lower lobes. Similar-appearing nodule within the right upper lobe measuring 1.9 x 1.4 cm (image 24; series 4). Similar-appearing surrounding ground-glass attenuation and architectural distortion compatible with post radiation changes. No pleural effusion or pneumothorax. Musculoskeletal: No aggressive or acute appearing osseous lesions. CT ABDOMEN PELVIS FINDINGS Hepatobiliary: Liver is normal in size and contour. Similar-appearing low-attenuation adjacent to the falciform ligament, potentially fatty deposition or perfusion anomaly. Gallbladder is unremarkable. No intrahepatic or extrahepatic biliary ductal dilatation. Pancreas: Unremarkable Spleen: Unremarkable Adrenals/Urinary Tract: Normal adrenal glands. Kidneys enhance symmetrically with contrast. No hydronephrosis. Urinary bladder is unremarkable. Stomach/Bowel: Slight interval decrease in pericolonic mesenteric  fat stranding (image 99; series 2). Interval decrease in fat stranding anterior to the right iliacus musculature (image 84; series 2). There is interval decrease in small bowel mesenteric fat stranding/haziness (image 77; series 2) with the area measuring approximately 7.6 x 3.2 cm,  previously 10.6 x 4.0 cm. Vascular/Lymphatic: Normal caliber abdominal aorta. Peripheral calcified noncalcified atherosclerotic plaque. No retroperitoneal lymphadenopathy. Reproductive: Status post hysterectomy. Other: None. Musculoskeletal: No aggressive or acute appearing osseous lesions. Lumbar spine degenerative changes. No aggressive or acute appearing osseous lesions. Unchanged mild height loss of the L1 vertebral body. IMPRESSION: 1. Interval improvement in previously described mesenteric fat stranding and nodularity within the sigmoid mesocolon in along the anterior aspect of the right iliacus musculature. Improved fat stranding within the small bowel mesentery. 2. Similar-appearing nodularity within the right upper lobe near the apex. Electronically Signed   By: Lovey Newcomer M.D.   On: 07/31/2018 14:03    ASSESSMENT AND PLAN:  This is a very pleasant 57 years old white female with extensive stage small cell lung cancer status post systemic chemotherapy with carbo platinum and etoposide for 6 cycles and the patient rotated her treatment well except for chemotherapy-induced anemia and requirement for PRBCs and platelet transfusion. She had significant improvement in her disease with the chemotherapy. She also had prophylactic cranial irradiation. She also underwent stereotactic radiotherapy to progressive right upper lobe pulmonary nodule. The patient has been in observation for close to 2 years. Repeat CT scan of the chest, abdomen and pelvis performed recently showed evidence for disease progression in the abdomen. The patient was started on systemic chemotherapy again with carboplatin, etoposide and Tecentriq status post 2 cycles. The patient is tolerating her treatment well except for fatigue. Repeat CT scan of the chest, abdomen and pelvis performed recently showed improvement of her disease. I personally and independently reviewed the scan and discussed the results with the patient and her  husband. I recommended for the patient to continue her current treatment with reduced dose carboplatin and etoposide as well as Tecentriq (Atezolizumab).  She will proceed with cycle #3 today. I will see the patient back for follow-up visit in 3 weeks for evaluation with the start of cycle #4. The patient was advised to call immediately if she has any concerning symptoms in the interval. The patient voices understanding of current disease status and treatment options and is in agreement with the current care plan. All questions were answered. The patient knows to call the clinic with any problems, questions or concerns. We can certainly see the patient much sooner if necessary.  Disclaimer: This note was dictated with voice recognition software. Similar sounding words can inadvertently be transcribed and may not be corrected upon review.

## 2018-08-03 NOTE — Telephone Encounter (Signed)
Printed calendar and avs.  Appointments were already scheduled for Dec 23 per 12/02 los

## 2018-08-04 ENCOUNTER — Inpatient Hospital Stay: Payer: Medicare Other

## 2018-08-04 ENCOUNTER — Other Ambulatory Visit: Payer: Self-pay | Admitting: Medical Oncology

## 2018-08-04 VITALS — BP 113/72 | HR 95 | Temp 97.6°F | Resp 18

## 2018-08-04 DIAGNOSIS — Z5111 Encounter for antineoplastic chemotherapy: Secondary | ICD-10-CM | POA: Diagnosis not present

## 2018-08-04 DIAGNOSIS — C3491 Malignant neoplasm of unspecified part of right bronchus or lung: Secondary | ICD-10-CM

## 2018-08-04 DIAGNOSIS — Z23 Encounter for immunization: Secondary | ICD-10-CM

## 2018-08-04 MED ORDER — FAMOTIDINE IN NACL 20-0.9 MG/50ML-% IV SOLN
20.0000 mg | Freq: Once | INTRAVENOUS | Status: AC
  Administered 2018-08-04: 20 mg via INTRAVENOUS

## 2018-08-04 MED ORDER — SODIUM CHLORIDE 0.9 % IV SOLN
80.0000 mg/m2 | Freq: Once | INTRAVENOUS | Status: AC
  Administered 2018-08-04: 140 mg via INTRAVENOUS
  Filled 2018-08-04: qty 7

## 2018-08-04 MED ORDER — LIDOCAINE-PRILOCAINE 2.5-2.5 % EX CREA: 1.0000 "application " | TOPICAL_CREAM | CUTANEOUS | 0 refills | Status: DC | PRN

## 2018-08-04 MED ORDER — FAMOTIDINE IN NACL 20-0.9 MG/50ML-% IV SOLN
INTRAVENOUS | Status: AC
  Filled 2018-08-04: qty 50

## 2018-08-04 MED ORDER — SODIUM CHLORIDE 0.9 % IV SOLN
Freq: Once | INTRAVENOUS | Status: AC
  Administered 2018-08-04: 10:00:00 via INTRAVENOUS
  Filled 2018-08-04: qty 250

## 2018-08-04 MED ORDER — DEXAMETHASONE SODIUM PHOSPHATE 10 MG/ML IJ SOLN
INTRAMUSCULAR | Status: AC
  Filled 2018-08-04: qty 1

## 2018-08-04 MED ORDER — SODIUM CHLORIDE 0.9% FLUSH
10.0000 mL | INTRAVENOUS | Status: DC | PRN
  Administered 2018-08-04: 10 mL
  Filled 2018-08-04: qty 10

## 2018-08-04 MED ORDER — DEXAMETHASONE SODIUM PHOSPHATE 10 MG/ML IJ SOLN
10.0000 mg | Freq: Once | INTRAMUSCULAR | Status: AC
  Administered 2018-08-04: 10 mg via INTRAVENOUS

## 2018-08-04 MED ORDER — HEPARIN SOD (PORK) LOCK FLUSH 100 UNIT/ML IV SOLN
500.0000 [IU] | Freq: Once | INTRAVENOUS | Status: AC | PRN
  Administered 2018-08-04: 500 [IU]
  Filled 2018-08-04: qty 5

## 2018-08-04 MED FILL — LIDOCAINE-PRILOCAINE CREAM: 2.5-2.5 | 30 days supply | Qty: 30 | Fill #0

## 2018-08-04 NOTE — Progress Notes (Signed)
Nutrition Assessment   Reason for Assessment:  Patient identified on Malnutrition screening report for weight loss and poor appetite   ASSESSMENT:  57 year old female with lung cancer followed by Dr. Mohammed.  Patient receiving chemotherapy of carboplatin, etoposide and tecentriq.  Past medical history of depression, HLD, HTN.  Met with patient and husband today and introduced self and service.  Patient reports appetite has been decreased for the past month.  Reports some nausea and sometimes takes medication.  Reports that she has been craving hot and sour soup.  Also eating yogurt, ice cream, eggs, drinking milk.  Does not like ensure/boost shakes or carnation instant breakfast.    Denies changes in bowel habits, trouble chewing or swallowing.    Medications: compazine, KCL, magic mouthwash, immodium   Labs: K 3.0, calcium 5.8   Anthropometrics:   Height: 63 inches Weight: 141 lb 12.8 oz  (12/2) Noted weight of 158 lb in April 2019 BMI: 25  11% weight loss in the last 6 months   Estimated Energy Needs  Kcals: 1600-1920 calories/d Protein: 80-96 g/d Fluid: > 1.6 L/d   NUTRITION DIAGNOSIS: Inadequate oral intake related to cancer related treatment side effects as evidenced by poor appetite for the past month and 11% weight loss in the last 6 months   INTERVENTION:  Discussed importance of good nutrition and weight maintenance.   Discussed foods high in protein and examples provided.   Encouraged patient to be proactive with nausea medications.   MONITORING, EVALUATION, GOAL: Patient will consume adequate calories and protein to maintain weight during treatment.   Next Visit: to be determined with treatment  Joli B. Allen, RD, LDN Registered Dietitian [phone] (pager)        

## 2018-08-04 NOTE — Patient Instructions (Signed)
[facility] Discharge Instructions for Patients Receiving Chemotherapy  Today you received the following chemotherapy agents:  Etoposide  To help prevent nausea and vomiting after your treatment, we encourage you to take your nausea medication as directed.   If you develop nausea and vomiting that is not controlled by your nausea medication, call the clinic.   BELOW ARE SYMPTOMS THAT SHOULD BE REPORTED IMMEDIATELY:  *FEVER GREATER THAN 100.5 F  *CHILLS WITH OR WITHOUT FEVER  NAUSEA AND VOMITING THAT IS NOT CONTROLLED WITH YOUR NAUSEA MEDICATION  *UNUSUAL SHORTNESS OF BREATH  *UNUSUAL BRUISING OR BLEEDING  TENDERNESS IN MOUTH AND THROAT WITH OR WITHOUT PRESENCE OF ULCERS  *URINARY PROBLEMS  *BOWEL PROBLEMS  UNUSUAL RASH Items with * indicate a potential emergency and should be followed up as soon as possible.  Feel free to call the clinic should you have any questions or concerns. The clinic phone number is [phone].  Please show the [place] at check-in to the Emergency Department and triage nurse.

## 2018-08-05 ENCOUNTER — Inpatient Hospital Stay: Payer: Medicare Other

## 2018-08-05 VITALS — BP 148/79 | HR 87 | Temp 97.8°F | Resp 14

## 2018-08-05 DIAGNOSIS — Z5111 Encounter for antineoplastic chemotherapy: Secondary | ICD-10-CM | POA: Diagnosis not present

## 2018-08-05 DIAGNOSIS — C3491 Malignant neoplasm of unspecified part of right bronchus or lung: Secondary | ICD-10-CM

## 2018-08-05 MED ORDER — FAMOTIDINE IN NACL 20-0.9 MG/50ML-% IV SOLN
INTRAVENOUS | Status: AC
  Filled 2018-08-05: qty 50

## 2018-08-05 MED ORDER — FAMOTIDINE IN NACL 20-0.9 MG/50ML-% IV SOLN
20.0000 mg | Freq: Once | INTRAVENOUS | Status: AC
  Administered 2018-08-05: 20 mg via INTRAVENOUS

## 2018-08-05 MED ORDER — HEPARIN SOD (PORK) LOCK FLUSH 100 UNIT/ML IV SOLN
500.0000 [IU] | Freq: Once | INTRAVENOUS | Status: AC | PRN
  Administered 2018-08-05: 500 [IU]
  Filled 2018-08-05: qty 5

## 2018-08-05 MED ORDER — DEXAMETHASONE SODIUM PHOSPHATE 10 MG/ML IJ SOLN
INTRAMUSCULAR | Status: AC
  Filled 2018-08-05: qty 1

## 2018-08-05 MED ORDER — SODIUM CHLORIDE 0.9 % IV SOLN
Freq: Once | INTRAVENOUS | Status: AC
  Administered 2018-08-05: 10:00:00 via INTRAVENOUS
  Filled 2018-08-05: qty 250

## 2018-08-05 MED ORDER — SODIUM CHLORIDE 0.9 % IV SOLN
80.0000 mg/m2 | Freq: Once | INTRAVENOUS | Status: AC
  Administered 2018-08-05: 140 mg via INTRAVENOUS
  Filled 2018-08-05: qty 7

## 2018-08-05 MED ORDER — DEXAMETHASONE SODIUM PHOSPHATE 10 MG/ML IJ SOLN
10.0000 mg | Freq: Once | INTRAMUSCULAR | Status: AC
  Administered 2018-08-05: 10 mg via INTRAVENOUS

## 2018-08-05 MED ORDER — SODIUM CHLORIDE 0.9% FLUSH
10.0000 mL | INTRAVENOUS | Status: DC | PRN
  Administered 2018-08-05: 10 mL
  Filled 2018-08-05: qty 10

## 2018-08-05 NOTE — Patient Instructions (Signed)
[facility] Discharge Instructions for Patients Receiving Chemotherapy  Today you received the following chemotherapy agents: Etoposide (Vepesid)  To help prevent nausea and vomiting after your treatment, we encourage you to take your nausea medication as directed.    If you develop nausea and vomiting that is not controlled by your nausea medication, call the clinic.   BELOW ARE SYMPTOMS THAT SHOULD BE REPORTED IMMEDIATELY:  *FEVER GREATER THAN 100.5 F  *CHILLS WITH OR WITHOUT FEVER  NAUSEA AND VOMITING THAT IS NOT CONTROLLED WITH YOUR NAUSEA MEDICATION  *UNUSUAL SHORTNESS OF BREATH  *UNUSUAL BRUISING OR BLEEDING  TENDERNESS IN MOUTH AND THROAT WITH OR WITHOUT PRESENCE OF ULCERS  *URINARY PROBLEMS  *BOWEL PROBLEMS  UNUSUAL RASH Items with * indicate a potential emergency and should be followed up as soon as possible.  Feel free to call the clinic should you have any questions or concerns. The clinic phone number is [phone].  Please show the [facility] at check-in to the Emergency Department and triage nurse.

## 2018-08-07 ENCOUNTER — Inpatient Hospital Stay: Payer: Medicare Other

## 2018-08-07 ENCOUNTER — Ambulatory Visit: Payer: Medicare Other

## 2018-08-07 VITALS — BP 139/90 | HR 98 | Temp 98.0°F | Resp 16

## 2018-08-07 DIAGNOSIS — C3491 Malignant neoplasm of unspecified part of right bronchus or lung: Secondary | ICD-10-CM

## 2018-08-07 DIAGNOSIS — Z5111 Encounter for antineoplastic chemotherapy: Secondary | ICD-10-CM | POA: Diagnosis not present

## 2018-08-07 MED ORDER — PEGFILGRASTIM-CBQV 6 MG/0.6ML ~~LOC~~ SOSY
PREFILLED_SYRINGE | SUBCUTANEOUS | Status: AC
  Filled 2018-08-07: qty 0.6

## 2018-08-07 MED ORDER — PEGFILGRASTIM-CBQV 6 MG/0.6ML ~~LOC~~ SOSY
6.0000 mg | PREFILLED_SYRINGE | Freq: Once | SUBCUTANEOUS | Status: AC
  Administered 2018-08-07: 6 mg via SUBCUTANEOUS

## 2018-08-07 NOTE — Patient Instructions (Signed)
Pegfilgrastim injection What is this medicine? PEGFILGRASTIM (PEG fil gra stim) is a long-acting granulocyte colony-stimulating factor that stimulates the growth of neutrophils, a type of white blood cell important in the body's fight against infection. It is used to reduce the incidence of fever and infection in patients with certain types of cancer who are receiving chemotherapy that affects the bone marrow, and to increase survival after being exposed to high doses of radiation. This medicine may be used for other purposes; ask your health care provider or pharmacist if you have questions. COMMON BRAND NAME(S): Neulasta What should I tell my health care provider before I take this medicine? They need to know if you have any of these conditions: -kidney disease -latex allergy -ongoing radiation therapy -sickle cell disease -skin reactions to acrylic adhesives (On-Body Injector only) -an unusual or allergic reaction to pegfilgrastim, filgrastim, other medicines, foods, dyes, or preservatives -pregnant or trying to get pregnant -breast-feeding How should I use this medicine? This medicine is for injection under the skin. If you get this medicine at home, you will be taught how to prepare and give the pre-filled syringe or how to use the On-body Injector. Refer to the patient Instructions for Use for detailed instructions. Use exactly as directed. Tell your healthcare provider immediately if you suspect that the On-body Injector may not have performed as intended or if you suspect the use of the On-body Injector resulted in a missed or partial dose. It is important that you put your used needles and syringes in a special sharps container. Do not put them in a trash can. If you do not have a sharps container, call your pharmacist or healthcare provider to get one. Talk to your pediatrician regarding the use of this medicine in children. While this drug may be prescribed for selected conditions,  precautions do apply. Overdosage: If you think you have taken too much of this medicine contact a poison control center or emergency room at once. NOTE: This medicine is only for you. Do not share this medicine with others. What if I miss a dose? It is important not to miss your dose. Call your doctor or health care professional if you miss your dose. If you miss a dose due to an On-body Injector failure or leakage, a new dose should be administered as soon as possible using a single prefilled syringe for manual use. What may interact with this medicine? Interactions have not been studied. Give your health care provider a list of all the medicines, herbs, non-prescription drugs, or dietary supplements you use. Also tell them if you smoke, drink alcohol, or use illegal drugs. Some items may interact with your medicine. This list may not describe all possible interactions. Give your health care provider a list of all the medicines, herbs, non-prescription drugs, or dietary supplements you use. Also tell them if you smoke, drink alcohol, or use illegal drugs. Some items may interact with your medicine. What should I watch for while using this medicine? You may need blood work done while you are taking this medicine. If you are going to need a MRI, CT scan, or other procedure, tell your doctor that you are using this medicine (On-Body Injector only). What side effects may I notice from receiving this medicine? Side effects that you should report to your doctor or health care professional as soon as possible: -allergic reactions like skin rash, itching or hives, swelling of the face, lips, or tongue -dizziness -fever -pain, redness, or irritation at site   where injected -pinpoint red spots on the skin -red or dark-brown urine -shortness of breath or breathing problems -stomach or side pain, or pain at the shoulder -swelling -tiredness -trouble passing urine or change in the amount of urine Side  effects that usually do not require medical attention (report to your doctor or health care professional if they continue or are bothersome): -bone pain -muscle pain This list may not describe all possible side effects. Call your doctor for medical advice about side effects. You may report side effects to FDA at 1-800-FDA-1088. Where should I keep my medicine? Keep out of the reach of children. Store pre-filled syringes in a refrigerator between 2 and 8 degrees C (36 and 46 degrees F). Do not freeze. Keep in carton to protect from light. Throw away this medicine if it is left out of the refrigerator for more than 48 hours. Throw away any unused medicine after the expiration date. NOTE: This sheet is a summary. It may not cover all possible information. If you have questions about this medicine, talk to your doctor, pharmacist, or health care provider.  2018 Elsevier/Gold Standard (2016-08-15 12:58:03)  

## 2018-08-10 ENCOUNTER — Inpatient Hospital Stay: Payer: Medicare Other

## 2018-08-10 DIAGNOSIS — Z5111 Encounter for antineoplastic chemotherapy: Secondary | ICD-10-CM | POA: Diagnosis not present

## 2018-08-10 DIAGNOSIS — R5382 Chronic fatigue, unspecified: Secondary | ICD-10-CM

## 2018-08-10 DIAGNOSIS — C3491 Malignant neoplasm of unspecified part of right bronchus or lung: Secondary | ICD-10-CM

## 2018-08-10 LAB — CMP (CANCER CENTER ONLY)
ALT: 49 U/L — ABNORMAL HIGH (ref 0–44)
AST: 33 U/L (ref 15–41)
Albumin: 3.4 g/dL — ABNORMAL LOW (ref 3.5–5.0)
Alkaline Phosphatase: 176 U/L — ABNORMAL HIGH (ref 38–126)
Anion gap: 11 (ref 5–15)
BILIRUBIN TOTAL: 1.3 mg/dL — AB (ref 0.3–1.2)
BUN: 12 mg/dL (ref 6–20)
CO2: 27 mmol/L (ref 22–32)
Calcium: 8.5 mg/dL — ABNORMAL LOW (ref 8.9–10.3)
Chloride: 100 mmol/L (ref 98–111)
Creatinine: 0.7 mg/dL (ref 0.44–1.00)
GFR, Est AFR Am: >60 mL/min (ref >60)
Glucose, Bld: 115 mg/dL — ABNORMAL HIGH (ref 70–99)
POTASSIUM: 3.5 mmol/L (ref 3.5–5.1)
Sodium: 138 mmol/L (ref 135–145)
TOTAL PROTEIN: 7.1 g/dL (ref 6.5–8.1)

## 2018-08-10 LAB — CBC WITH DIFFERENTIAL (CANCER CENTER ONLY)
Abs Immature Granulocytes: 0 10*3/uL (ref 0.00–0.07)
Band Neutrophils: 7 %
Basophils Absolute: 0 10*3/uL (ref 0.0–0.1)
Basophils Relative: 0 %
EOS ABS: 0 10*3/uL (ref 0.0–0.5)
Eosinophils Relative: 0 %
HEMATOCRIT: 28.1 % — AB (ref 36.0–46.0)
Hemoglobin: 9.2 g/dL — ABNORMAL LOW (ref 12.0–15.0)
Lymphocytes Relative: 12 %
Lymphs Abs: 1.3 10*3/uL (ref 0.7–4.0)
MCH: 32.2 pg (ref 26.0–34.0)
MCHC: 32.7 g/dL (ref 30.0–36.0)
MCV: 98.3 fL (ref 80.0–100.0)
Monocytes Absolute: 0 10*3/uL — ABNORMAL LOW (ref 0.1–1.0)
Monocytes Relative: 0 %
Neutro Abs: 9.3 10*3/uL (ref 1.7–17.7)
Neutrophils Relative %: 81 %
Platelet Count: 134 10*3/uL — ABNORMAL LOW (ref 150–400)
RBC: 2.86 MIL/uL — ABNORMAL LOW (ref 3.87–5.11)
RDW: 15.3 % (ref 11.5–15.5)
WBC Count: 10.6 10*3/uL — ABNORMAL HIGH (ref 4.0–10.5)
nRBC: 0 % (ref 0.0–0.2)

## 2018-08-10 LAB — TSH: TSH: 1.479 u[IU]/mL (ref 0.308–3.960)

## 2018-08-10 MED ORDER — HEPARIN SOD (PORK) LOCK FLUSH 100 UNIT/ML IV SOLN
500.0000 [IU] | Freq: Once | INTRAVENOUS | Status: AC | PRN
  Administered 2018-08-10: 500 [IU]
  Filled 2018-08-10: qty 5

## 2018-08-10 MED ORDER — SODIUM CHLORIDE 0.9% FLUSH
10.0000 mL | INTRAVENOUS | Status: DC | PRN
  Administered 2018-08-10: 10 mL
  Filled 2018-08-10: qty 10

## 2018-08-10 NOTE — Patient Instructions (Signed)
Implanted Port Home Guide An implanted port is a type of central line that is placed under the skin. Central lines are used to provide IV access when treatment or nutrition needs to be given through a person's veins. Implanted ports are used for long-term IV access. An implanted port may be placed because:  You need IV medicine that would be irritating to the small veins in your hands or arms.  You need long-term IV medicines, such as antibiotics.  You need IV nutrition for a long period.  You need frequent blood draws for lab tests.  You need dialysis.  Implanted ports are usually placed in the chest area, but they can also be placed in the upper arm, the abdomen, or the leg. An implanted port has two main parts:  Reservoir. The reservoir is round and will appear as a small, raised area under your skin. The reservoir is the part where a needle is inserted to give medicines or draw blood.  Catheter. The catheter is a thin, flexible tube that extends from the reservoir. The catheter is placed into a large vein. Medicine that is inserted into the reservoir goes into the catheter and then into the vein.  How will I care for my incision site? Do not get the incision site wet. Bathe or shower as directed by your health care provider. How is my port accessed? Special steps must be taken to access the port:  Before the port is accessed, a numbing cream can be placed on the skin. This helps numb the skin over the port site.  Your health care provider uses a sterile technique to access the port. ? Your health care provider must put on a mask and sterile gloves. ? The skin over your port is cleaned carefully with an antiseptic and allowed to dry. ? The port is gently pinched between sterile gloves, and a needle is inserted into the port.  Only "non-coring" port needles should be used to access the port. Once the port is accessed, a blood return should be checked. This helps ensure that the port  is in the vein and is not clogged.  If your port needs to remain accessed for a constant infusion, a clear (transparent) bandage will be placed over the needle site. The bandage and needle will need to be changed every week, or as directed by your health care provider.  Keep the bandage covering the needle clean and dry. Do not get it wet. Follow your health care provider's instructions on how to take a shower or bath while the port is accessed.  If your port does not need to stay accessed, no bandage is needed over the port.  What is flushing? Flushing helps keep the port from getting clogged. Follow your health care provider's instructions on how and when to flush the port. Ports are usually flushed with saline solution or a medicine called heparin. The need for flushing will depend on how the port is used.  If the port is used for intermittent medicines or blood draws, the port will need to be flushed: ? After medicines have been given. ? After blood has been drawn. ? As part of routine maintenance.  If a constant infusion is running, the port may not need to be flushed.  How long will my port stay implanted? The port can stay in for as long as your health care provider thinks it is needed. When it is time for the port to come out, surgery will be   done to remove it. The procedure is similar to the one performed when the port was put in. When should I seek immediate medical care? When you have an implanted port, you should seek immediate medical care if:  You notice a bad smell coming from the incision site.  You have swelling, redness, or drainage at the incision site.  You have more swelling or pain at the port site or the surrounding area.  You have a fever that is not controlled with medicine.  This information is not intended to replace advice given to you by your health care provider. Make sure you discuss any questions you have with your health care provider. Document  Released: 08/19/2005 Document Revised: 01/25/2016 Document Reviewed: 04/26/2013 Elsevier Interactive Patient Education  2017 Elsevier Inc.  

## 2018-08-17 ENCOUNTER — Telehealth: Payer: Self-pay | Admitting: *Deleted

## 2018-08-17 ENCOUNTER — Inpatient Hospital Stay: Payer: Medicare Other

## 2018-08-17 ENCOUNTER — Other Ambulatory Visit: Payer: Self-pay | Admitting: *Deleted

## 2018-08-17 DIAGNOSIS — C3491 Malignant neoplasm of unspecified part of right bronchus or lung: Secondary | ICD-10-CM

## 2018-08-17 DIAGNOSIS — Z5111 Encounter for antineoplastic chemotherapy: Secondary | ICD-10-CM | POA: Diagnosis not present

## 2018-08-17 LAB — CBC WITH DIFFERENTIAL (CANCER CENTER ONLY)
Abs Immature Granulocytes: 1.01 10*3/uL — ABNORMAL HIGH (ref 0.00–0.07)
Basophils Absolute: 0 10*3/uL (ref 0.0–0.1)
Basophils Relative: 0 %
Eosinophils Absolute: 0 10*3/uL (ref 0.0–0.5)
Eosinophils Relative: 0 %
HEMATOCRIT: 20.5 % — AB (ref 36.0–46.0)
HEMOGLOBIN: 7 g/dL — AB (ref 12.0–15.0)
Immature Granulocytes: 8 %
LYMPHS ABS: 2 10*3/uL (ref 0.7–4.0)
LYMPHS PCT: 16 %
MCH: 31.8 pg (ref 26.0–34.0)
MCHC: 34.1 g/dL (ref 30.0–36.0)
MCV: 93.2 fL (ref 80.0–100.0)
Monocytes Absolute: 1.9 10*3/uL — ABNORMAL HIGH (ref 0.1–1.0)
Monocytes Relative: 15 %
Neutro Abs: 7.5 10*3/uL (ref 1.7–7.7)
Neutrophils Relative %: 61 %
Platelet Count: 9 10*3/uL — CL (ref 150–400)
RBC: 2.2 MIL/uL — ABNORMAL LOW (ref 3.87–5.11)
RDW: 14.6 % (ref 11.5–15.5)
WBC Count: 12.6 10*3/uL — ABNORMAL HIGH (ref 4.0–10.5)
nRBC: 0.2 % (ref 0.0–0.2)

## 2018-08-17 LAB — CMP (CANCER CENTER ONLY)
ALT: 30 U/L (ref 0–44)
AST: 18 U/L (ref 15–41)
Albumin: 3.3 g/dL — ABNORMAL LOW (ref 3.5–5.0)
Alkaline Phosphatase: 176 U/L — ABNORMAL HIGH (ref 38–126)
Anion gap: 13 (ref 5–15)
BUN: <4 mg/dL — ABNORMAL LOW (ref 6–20)
CO2: 25 mmol/L (ref 22–32)
Calcium: 7 mg/dL — ABNORMAL LOW (ref 8.9–10.3)
Chloride: 103 mmol/L (ref 98–111)
Creatinine: 0.72 mg/dL (ref 0.44–1.00)
GFR, Est AFR Am: >60 mL/min (ref >60)
GFR, Estimated: >60 mL/min (ref >60)
GLUCOSE: 112 mg/dL — AB (ref 70–99)
Potassium: 3.1 mmol/L — ABNORMAL LOW (ref 3.5–5.1)
Sodium: 141 mmol/L (ref 135–145)
Total Bilirubin: <0.2 mg/dL — ABNORMAL LOW (ref 0.3–1.2)
Total Protein: 6.5 g/dL (ref 6.5–8.1)

## 2018-08-17 MED ORDER — SODIUM CHLORIDE 0.9% FLUSH
10.0000 mL | INTRAVENOUS | Status: DC | PRN
  Administered 2018-08-17: 10 mL
  Filled 2018-08-17: qty 10

## 2018-08-17 MED ORDER — HEPARIN SOD (PORK) LOCK FLUSH 100 UNIT/ML IV SOLN
500.0000 [IU] | Freq: Once | INTRAVENOUS | Status: AC | PRN
  Administered 2018-08-17: 500 [IU]
  Filled 2018-08-17: qty 5

## 2018-08-17 NOTE — Telephone Encounter (Signed)
"  Lavera Guise, Vedia Coffer Pltc = 9, HGB = 7."

## 2018-08-17 NOTE — Patient Instructions (Signed)

## 2018-08-17 NOTE — Progress Notes (Signed)
Per MD pt to rcv 2 units blood and 1 unit platelets. Message to scheduling

## 2018-08-18 ENCOUNTER — Telehealth: Payer: Self-pay | Admitting: Internal Medicine

## 2018-08-18 NOTE — Telephone Encounter (Signed)
Per 12/16 sch message - unable to schedule blood this week until Saturday - due to capped days - per RN Mechele Claude Diane made aware - no appt scheduled at the moment for transfusion.

## 2018-08-19 ENCOUNTER — Telehealth: Payer: Self-pay

## 2018-08-19 NOTE — Telephone Encounter (Signed)
Called Debra Kidd as she needs blood this week.  I was able to schedule her for Thursday, 08/20/2018 at 8:15 for lab, 8:30 for port flush, and 9:00 for blood transfusion in our [facility].  She was happy about that as her HGB was 7.0.  She had a type and cross drawn on Monday but she stated she never received a bracelet so I advised her that the type and cross had to be redrawn.  She understands and will be here tomorrow at 8:15am. Gardiner Rhyme

## 2018-08-20 ENCOUNTER — Inpatient Hospital Stay: Payer: Medicare Other

## 2018-08-20 ENCOUNTER — Other Ambulatory Visit: Payer: Self-pay | Admitting: Internal Medicine

## 2018-08-20 DIAGNOSIS — C3491 Malignant neoplasm of unspecified part of right bronchus or lung: Secondary | ICD-10-CM

## 2018-08-20 DIAGNOSIS — Z5111 Encounter for antineoplastic chemotherapy: Secondary | ICD-10-CM | POA: Diagnosis not present

## 2018-08-20 LAB — CBC WITH DIFFERENTIAL (CANCER CENTER ONLY)
Abs Immature Granulocytes: 1.42 10*3/uL — ABNORMAL HIGH (ref 0.00–0.07)
Basophils Absolute: 0.1 10*3/uL (ref 0.0–0.1)
Basophils Relative: 0 %
EOS ABS: 0 10*3/uL (ref 0.0–0.5)
Eosinophils Relative: 0 %
HCT: 21.2 % — ABNORMAL LOW (ref 36.0–46.0)
Hemoglobin: 7 g/dL — CL (ref 12.0–15.0)
Immature Granulocytes: 8 %
Lymphocytes Relative: 12 %
Lymphs Abs: 2 10*3/uL (ref 0.7–4.0)
MCH: 31.1 pg (ref 26.0–34.0)
MCHC: 33 g/dL (ref 30.0–36.0)
MCV: 94.2 fL (ref 80.0–100.0)
MONOS PCT: 12 %
Monocytes Absolute: 2 10*3/uL — ABNORMAL HIGH (ref 0.1–1.0)
NEUTROS PCT: 68 %
Neutro Abs: 11.5 10*3/uL — ABNORMAL HIGH (ref 1.7–7.7)
Platelet Count: 39 10*3/uL — ABNORMAL LOW (ref 150–400)
RBC: 2.25 MIL/uL — ABNORMAL LOW (ref 3.87–5.11)
RDW: 14.9 % (ref 11.5–15.5)
WBC Count: 17 10*3/uL — ABNORMAL HIGH (ref 4.0–10.5)
nRBC: 0.2 % (ref 0.0–0.2)

## 2018-08-20 LAB — CMP (CANCER CENTER ONLY)
ALT: 21 U/L (ref 0–44)
ANION GAP: 16 — AB (ref 5–15)
AST: 14 U/L — ABNORMAL LOW (ref 15–41)
Albumin: 3.4 g/dL — ABNORMAL LOW (ref 3.5–5.0)
Alkaline Phosphatase: 205 U/L — ABNORMAL HIGH (ref 38–126)
BUN: 4 mg/dL — ABNORMAL LOW (ref 6–20)
CO2: 23 mmol/L (ref 22–32)
Calcium: 6.4 mg/dL — CL (ref 8.9–10.3)
Chloride: 100 mmol/L (ref 98–111)
Creatinine: 0.74 mg/dL (ref 0.44–1.00)
GFR, Est AFR Am: >60 mL/min (ref >60)
GFR, Estimated: >60 mL/min (ref >60)
Glucose, Bld: 110 mg/dL — ABNORMAL HIGH (ref 70–99)
Potassium: 3.1 mmol/L — ABNORMAL LOW (ref 3.5–5.1)
Sodium: 139 mmol/L (ref 135–145)
Total Bilirubin: 0.3 mg/dL (ref 0.3–1.2)
Total Protein: 6.7 g/dL (ref 6.5–8.1)

## 2018-08-20 MED ORDER — SODIUM CHLORIDE 0.9 % IV SOLN
2.0000 g | Freq: Once | INTRAVENOUS | Status: AC
  Administered 2018-08-20: 2 g via INTRAVENOUS
  Filled 2018-08-20: qty 20

## 2018-08-20 MED ORDER — DIPHENHYDRAMINE HCL 25 MG PO CAPS
ORAL_CAPSULE | ORAL | Status: AC
  Filled 2018-08-20: qty 1

## 2018-08-20 MED ORDER — SODIUM CHLORIDE 0.9% IV SOLUTION
250.0000 mL | Freq: Once | INTRAVENOUS | Status: AC
  Administered 2018-08-20: 250 mL via INTRAVENOUS
  Filled 2018-08-20: qty 250

## 2018-08-20 MED ORDER — HEPARIN SOD (PORK) LOCK FLUSH 100 UNIT/ML IV SOLN
500.0000 [IU] | Freq: Every day | INTRAVENOUS | Status: AC | PRN
  Administered 2018-08-20: 500 [IU]
  Filled 2018-08-20: qty 5

## 2018-08-20 MED ORDER — SODIUM CHLORIDE 0.9 % IV SOLN
2.0000 g | Freq: Once | INTRAVENOUS | Status: DC
  Filled 2018-08-20: qty 20

## 2018-08-20 MED ORDER — ACETAMINOPHEN 325 MG PO TABS
650.0000 mg | ORAL_TABLET | Freq: Once | ORAL | Status: AC
  Administered 2018-08-20: 650 mg via ORAL

## 2018-08-20 MED ORDER — ONDANSETRON HCL 4 MG/2ML IJ SOLN
8.0000 mg | Freq: Once | INTRAMUSCULAR | Status: AC
  Administered 2018-08-20: 8 mg via INTRAVENOUS

## 2018-08-20 MED ORDER — ONDANSETRON HCL 4 MG/2ML IJ SOLN
INTRAMUSCULAR | Status: AC
  Filled 2018-08-20: qty 4

## 2018-08-20 MED ORDER — ACETAMINOPHEN 325 MG PO TABS
ORAL_TABLET | ORAL | Status: AC
  Filled 2018-08-20: qty 2

## 2018-08-20 MED ORDER — SODIUM CHLORIDE 0.9% FLUSH
10.0000 mL | INTRAVENOUS | Status: AC | PRN
  Administered 2018-08-20: 10 mL
  Filled 2018-08-20: qty 10

## 2018-08-20 MED ORDER — DIPHENHYDRAMINE HCL 25 MG PO CAPS
25.0000 mg | ORAL_CAPSULE | Freq: Once | ORAL | Status: AC
  Administered 2018-08-20: 25 mg via ORAL

## 2018-08-20 MED ORDER — SODIUM CHLORIDE 0.9% FLUSH
10.0000 mL | INTRAVENOUS | Status: DC | PRN
  Administered 2018-08-20: 10 mL
  Filled 2018-08-20: qty 10

## 2018-08-20 MED ORDER — SODIUM CHLORIDE 0.9 % IV SOLN
INTRAVENOUS | Status: DC
  Administered 2018-08-20: 11:00:00 via INTRAVENOUS
  Filled 2018-08-20: qty 250

## 2018-08-20 NOTE — Patient Instructions (Signed)
Blood Transfusion, Adult, Care After This sheet gives you information about how to care for yourself after your procedure. Your doctor may also give you more specific instructions. If you have problems or questions, contact your doctor. Follow these instructions at home:   Take over-the-counter and prescription medicines only as told by your doctor.  Go back to your normal activities as told by your doctor.  Follow instructions from your doctor about how to take care of the area where an IV tube was put into your vein (insertion site). Make sure you: ? Wash your hands with soap and water before you change your bandage (dressing). If there is no soap and water, use hand sanitizer. ? Change your bandage as told by your doctor.  Check your IV insertion site every day for signs of infection. Check for: ? More redness, swelling, or pain. ? More fluid or blood. ? Warmth. ? Pus or a bad smell. Contact a doctor if:  You have more redness, swelling, or pain around the IV insertion site.  You have more fluid or blood coming from the IV insertion site.  Your IV insertion site feels warm to the touch.  You have pus or a bad smell coming from the IV insertion site.  Your pee (urine) turns pink, red, or brown.  You feel weak after doing your normal activities. Get help right away if:  You have signs of a serious allergic or body defense (immune) system reaction, including: ? Itchiness. ? Hives. ? Trouble breathing. ? Anxiety. ? Pain in your chest or lower back. ? Fever, flushing, and chills. ? Fast pulse. ? Rash. ? Watery poop (diarrhea). ? Throwing up (vomiting). ? Dark pee. ? Serious headache. ? Dizziness. ? Stiff neck. ? Yellow color in your face or the white parts of your eyes (jaundice). Summary  After a blood transfusion, return to your normal activities as told by your doctor.  Every day, check for signs of infection where the IV tube was put into your vein.  Some  signs of infection are warm skin, more redness and pain, more fluid or blood, and pus or a bad smell where the needle went in.  Contact your doctor if you feel weak or have any unusual symptoms. This information is not intended to replace advice given to you by your health care provider. Make sure you discuss any questions you have with your health care provider. Document Released: 09/09/2014 Document Revised: 04/12/2016 Document Reviewed: 04/12/2016 Elsevier Interactive Patient Education  2019 [employer]  

## 2018-08-21 LAB — TYPE AND SCREEN
ABO/RH(D): A NEG
Antibody Screen: NEGATIVE
Unit division: 0
Unit division: 0

## 2018-08-21 LAB — BPAM RBC
Blood Product Expiration Date: 202001032359
Blood Product Expiration Date: 202001032359
ISSUE DATE / TIME: 201912191116
ISSUE DATE / TIME: 201912191116
Unit Type and Rh: 600
Unit Type and Rh: 600

## 2018-08-22 LAB — BPAM PLATELET PHERESIS
Blood Product Expiration Date: 201912212359
ISSUE DATE / TIME: 201912190947
UNIT TYPE AND RH: 1700

## 2018-08-22 LAB — PREPARE PLATELET PHERESIS: Unit division: 0

## 2018-08-24 ENCOUNTER — Inpatient Hospital Stay: Payer: Medicare Other | Admitting: Internal Medicine

## 2018-08-24 ENCOUNTER — Other Ambulatory Visit: Payer: Self-pay

## 2018-08-24 ENCOUNTER — Other Ambulatory Visit: Payer: Self-pay | Admitting: Medical Oncology

## 2018-08-24 ENCOUNTER — Inpatient Hospital Stay: Payer: Medicare Other

## 2018-08-24 ENCOUNTER — Telehealth: Payer: Self-pay | Admitting: Medical Oncology

## 2018-08-24 ENCOUNTER — Inpatient Hospital Stay (HOSPITAL_COMMUNITY)
Admission: EM | Admit: 2018-08-24 | Discharge: 2018-08-27 | DRG: 641 | Disposition: A | Discharge status: Home Health | Payer: Medicare Other | Attending: Internal Medicine | Admitting: Internal Medicine

## 2018-08-24 ENCOUNTER — Observation Stay (HOSPITAL_COMMUNITY): Payer: Medicare Other

## 2018-08-24 ENCOUNTER — Encounter (HOSPITAL_COMMUNITY): Payer: Self-pay

## 2018-08-24 ENCOUNTER — Emergency Department (HOSPITAL_COMMUNITY): Payer: Medicare Other

## 2018-08-24 DIAGNOSIS — Z885 Allergy status to narcotic agent status: Secondary | ICD-10-CM

## 2018-08-24 DIAGNOSIS — E876 Hypokalemia: Secondary | ICD-10-CM

## 2018-08-24 DIAGNOSIS — S22000A Wedge compression fracture of unspecified thoracic vertebra, initial encounter for closed fracture: Secondary | ICD-10-CM

## 2018-08-24 DIAGNOSIS — F17210 Nicotine dependence, cigarettes, uncomplicated: Secondary | ICD-10-CM | POA: Diagnosis present

## 2018-08-24 DIAGNOSIS — Z85828 Personal history of other malignant neoplasm of skin: Secondary | ICD-10-CM

## 2018-08-24 DIAGNOSIS — M4854XA Collapsed vertebra, not elsewhere classified, thoracic region, initial encounter for fracture: Secondary | ICD-10-CM | POA: Diagnosis present

## 2018-08-24 DIAGNOSIS — M549 Dorsalgia, unspecified: Secondary | ICD-10-CM | POA: Diagnosis not present

## 2018-08-24 DIAGNOSIS — I10 Essential (primary) hypertension: Secondary | ICD-10-CM | POA: Diagnosis present

## 2018-08-24 DIAGNOSIS — M545 Low back pain, unspecified: Secondary | ICD-10-CM

## 2018-08-24 DIAGNOSIS — F419 Anxiety disorder, unspecified: Secondary | ICD-10-CM | POA: Diagnosis present

## 2018-08-24 DIAGNOSIS — E785 Hyperlipidemia, unspecified: Secondary | ICD-10-CM | POA: Diagnosis present

## 2018-08-24 DIAGNOSIS — C3491 Malignant neoplasm of unspecified part of right bronchus or lung: Secondary | ICD-10-CM | POA: Diagnosis present

## 2018-08-24 DIAGNOSIS — K5900 Constipation, unspecified: Secondary | ICD-10-CM | POA: Diagnosis present

## 2018-08-24 DIAGNOSIS — C7889 Secondary malignant neoplasm of other digestive organs: Secondary | ICD-10-CM | POA: Diagnosis present

## 2018-08-24 DIAGNOSIS — E559 Vitamin D deficiency, unspecified: Secondary | ICD-10-CM | POA: Diagnosis present

## 2018-08-24 DIAGNOSIS — Z888 Allergy status to other drugs, medicaments and biological substances status: Secondary | ICD-10-CM

## 2018-08-24 DIAGNOSIS — R059 Cough, unspecified: Secondary | ICD-10-CM

## 2018-08-24 DIAGNOSIS — D72829 Elevated white blood cell count, unspecified: Secondary | ICD-10-CM | POA: Diagnosis present

## 2018-08-24 DIAGNOSIS — T451X5A Adverse effect of antineoplastic and immunosuppressive drugs, initial encounter: Secondary | ICD-10-CM | POA: Diagnosis present

## 2018-08-24 DIAGNOSIS — G894 Chronic pain syndrome: Secondary | ICD-10-CM | POA: Diagnosis present

## 2018-08-24 DIAGNOSIS — R05 Cough: Secondary | ICD-10-CM

## 2018-08-24 DIAGNOSIS — Z791 Long term (current) use of non-steroidal anti-inflammatories (NSAID): Secondary | ICD-10-CM

## 2018-08-24 DIAGNOSIS — Z79899 Other long term (current) drug therapy: Secondary | ICD-10-CM

## 2018-08-24 DIAGNOSIS — M810 Age-related osteoporosis without current pathological fracture: Secondary | ICD-10-CM | POA: Diagnosis present

## 2018-08-24 LAB — CBC
HCT: 35.9 % — ABNORMAL LOW (ref 36.0–46.0)
Hemoglobin: 11.7 g/dL — ABNORMAL LOW (ref 12.0–15.0)
MCH: 30.9 pg (ref 26.0–34.0)
MCHC: 32.6 g/dL (ref 30.0–36.0)
MCV: 94.7 fL (ref 80.0–100.0)
Platelets: 144 10*3/uL — ABNORMAL LOW (ref 150–400)
RBC: 3.79 MIL/uL — ABNORMAL LOW (ref 3.87–5.11)
RDW: 17.2 % — ABNORMAL HIGH (ref 11.5–15.5)
WBC: 22.4 10*3/uL — ABNORMAL HIGH (ref 4.0–10.5)
nRBC: 0 % (ref 0.0–0.2)

## 2018-08-24 LAB — MAGNESIUM: Magnesium: 0.9 mg/dL — CL (ref 1.7–2.4)

## 2018-08-24 LAB — COMPREHENSIVE METABOLIC PANEL
ALT: 17 U/L (ref 0–44)
ANION GAP: 14 (ref 5–15)
AST: 19 U/L (ref 15–41)
Albumin: 3.9 g/dL (ref 3.5–5.0)
Alkaline Phosphatase: 163 U/L — ABNORMAL HIGH (ref 38–126)
BUN: 7 mg/dL (ref 6–20)
CO2: 22 mmol/L (ref 22–32)
Calcium: 5.4 mg/dL — CL (ref 8.9–10.3)
Chloride: 100 mmol/L (ref 98–111)
Creatinine, Ser: 0.74 mg/dL (ref 0.44–1.00)
GFR calc non Af Amer: >60 mL/min (ref >60)
Glucose, Bld: 148 mg/dL — ABNORMAL HIGH (ref 70–99)
Potassium: 3 mmol/L — ABNORMAL LOW (ref 3.5–5.1)
Sodium: 136 mmol/L (ref 135–145)
Total Bilirubin: 0.9 mg/dL (ref 0.3–1.2)
Total Protein: 7.1 g/dL (ref 6.5–8.1)

## 2018-08-24 MED ORDER — CALCIUM GLUCONATE-NACL 1-0.675 GM/50ML-% IV SOLN
1.0000 g | Freq: Once | INTRAVENOUS | Status: AC
  Administered 2018-08-24: 1000 mg via INTRAVENOUS
  Filled 2018-08-24: qty 50

## 2018-08-24 MED ORDER — FENTANYL CITRATE (PF) 100 MCG/2ML IJ SOLN
50.0000 ug | Freq: Once | INTRAMUSCULAR | Status: AC
  Administered 2018-08-24: 50 ug via INTRAVENOUS
  Filled 2018-08-24: qty 2

## 2018-08-24 MED ORDER — POTASSIUM CHLORIDE 20 MEQ/15ML (10%) PO SOLN
40.0000 meq | Freq: Once | ORAL | Status: AC
  Administered 2018-08-25: 40 meq via ORAL
  Filled 2018-08-24: qty 30

## 2018-08-24 MED ORDER — CYCLOBENZAPRINE HCL 5 MG PO TABS
5.0000 mg | ORAL_TABLET | Freq: Every day | ORAL | Status: DC
  Administered 2018-08-25: 5 mg via ORAL
  Filled 2018-08-24: qty 1

## 2018-08-24 MED ORDER — ACETAMINOPHEN 325 MG PO TABS
650.0000 mg | ORAL_TABLET | Freq: Four times a day (QID) | ORAL | Status: DC | PRN
  Filled 2018-08-24: qty 2

## 2018-08-24 MED ORDER — POTASSIUM CHLORIDE 20 MEQ/15ML (10%) PO SOLN: 40.0000 meq | Freq: Once | ORAL | Status: AC

## 2018-08-24 MED ORDER — ONDANSETRON HCL 4 MG/2ML IJ SOLN: 4.0000 mg | Freq: Four times a day (QID) | INTRAMUSCULAR | Status: DC | PRN

## 2018-08-24 MED ORDER — ONDANSETRON HCL 4 MG/2ML IJ SOLN
4.0000 mg | Freq: Once | INTRAMUSCULAR | Status: AC
  Administered 2018-08-24: 4 mg via INTRAVENOUS
  Filled 2018-08-24: qty 2

## 2018-08-24 MED ORDER — MORPHINE SULFATE (PF) 4 MG/ML IV SOLN
4.0000 mg | INTRAVENOUS | Status: DC | PRN
  Administered 2018-08-25 (×4): 4 mg via INTRAVENOUS
  Filled 2018-08-24 (×4): qty 1

## 2018-08-24 MED ORDER — ONDANSETRON HCL 4 MG PO TABS: 4.0000 mg | ORAL_TABLET | Freq: Four times a day (QID) | ORAL | Status: DC | PRN

## 2018-08-24 MED ORDER — SODIUM CHLORIDE 0.9 % IV BOLUS
1000.0000 mL | Freq: Once | INTRAVENOUS | Status: AC
  Administered 2018-08-24: 1000 mL via INTRAVENOUS

## 2018-08-24 MED ORDER — POLYETHYLENE GLYCOL 3350 17 G PO PACK: 17.0000 g | PACK | Freq: Every day | ORAL | Status: DC | PRN

## 2018-08-24 MED ORDER — POTASSIUM CHLORIDE CRYS ER 20 MEQ PO TBCR: 40.0000 meq | EXTENDED_RELEASE_TABLET | ORAL | Status: DC

## 2018-08-24 MED ORDER — MORPHINE SULFATE (PF) 2 MG/ML IV SOLN: 2.0000 mg | INTRAVENOUS | Status: DC | PRN

## 2018-08-24 MED ORDER — POTASSIUM CHLORIDE CRYS ER 20 MEQ PO TBCR
40.0000 meq | EXTENDED_RELEASE_TABLET | Freq: Once | ORAL | Status: AC
  Administered 2018-08-24: 40 meq via ORAL
  Filled 2018-08-24: qty 2

## 2018-08-24 MED ORDER — ENOXAPARIN SODIUM 40 MG/0.4ML ~~LOC~~ SOLN
40.0000 mg | Freq: Every day | SUBCUTANEOUS | Status: DC
  Administered 2018-08-25 – 2018-08-26 (×3): 40 mg via SUBCUTANEOUS
  Filled 2018-08-24 (×4): qty 0.4

## 2018-08-24 MED ORDER — ACETAMINOPHEN 650 MG RE SUPP: 650.0000 mg | Freq: Four times a day (QID) | RECTAL | Status: DC | PRN

## 2018-08-24 MED ORDER — SODIUM CHLORIDE 0.9 % IV SOLN: 1.0000 g | Freq: Once | INTRAVENOUS | Status: DC

## 2018-08-24 NOTE — H&P (Signed)
History and Physical    Debra Kidd CBJ:[id number] DOB: 1960-10-06 DOA: 08/24/2018  PCP: [name], MD   Patient coming from: Home  Chief Complaint: Back pain  HPI: Debra Kidd is a 57 y.o. female with medical history significant for small cell lung cancer, depression, hypertension, presented to the ED with complaints of sudden onset back pain that started this morning when she turned in bed. Pain was so severe she could not get out of bed. She denies shooting pain down her lower extremities, denies weakness or heaviness of her extremities. She reports some numbness in bilateral lower extremities yesterday lasting a few hours that has since resolved.  Denies chronic back pains prior to today.  No Bowel or urinary problems.  Reports a dry cough that started yesterday, without difficulty breathing. No vomiting no loose stools, reports chronic poor p.o. intake.  She reports chills without fevers.  ED Course: Tachycardic- 119, with tachypnea to 28.  Thoracic and lumbar CT shows acute compression deformity involving the superior endplate of T5 and T6 with up to 35% height loss, also involving T8 vertebra.  Calcium low- 5.4.  Leukocytosis to 22.4.  Improved hemoglobin and platelets.  EKG with nonspecific T wave abnormalities anterolaterally.  No old EKG to compare. EDP talked to neurosurgery on-call, no urgent intervention.  1 g Ca gluconate given hospitalist to admit for intractable back pain.  Review of Systems: As per HPI all other systems reviewed and negative  Past Medical History:  Diagnosis Date  . 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 Blood type, Rh positive   . Cancer ([place])   . Chronic pain syndrome   . Depression 08/07/2016  . Encounter for antineoplastic chemotherapy 07/17/2016  . History of external beam radiation therapy 11/19/16-12/02/16   brain 25 Gy in 10 fractions  . Hyperlipidemia   . Hypertension   . Hypertension 08/07/2016  . Osteoporosis     Past  Surgical History:  Procedure Laterality Date  . ABDOMINAL HYSTERECTOMY  1981  . APPENDECTOMY     at age 52  . EYE SURGERY     left  . IR GENERIC HISTORICAL  06/03/2016   IR FLUORO GUIDE PORT INSERTION RIGHT 06/03/2016 [facility]  . IR GENERIC HISTORICAL  06/03/2016   IR US GUIDE VASC ACCESS RIGHT 06/03/2016 [facility]  . SKIN CANCER EXCISION     basal cell carcinoma L side of face     reports that she has been smoking. She has a 43.00 pack-year smoking history. She has never used smokeless tobacco. She reports previous alcohol use. She reports current drug use. Frequency: 7.00 times per week. Drug: Marijuana.  Allergies  Allergen Reactions  . Codeine Nausea And Vomiting  . Motrin [Ibuprofen]     Elevation of BP  . [name]     intolerant  . Vicodin [Hydrocodone-Acetaminophen] Nausea And Vomiting    Family History  Problem Relation Age of Onset  . Heart disease Mother   . Heart disease Father   . Emphysema Brother   . Breast cancer Maternal Grandmother   . Heart disease Brother     Prior to Admission medications   Medication Sig Start Date End Date Taking? Authorizing Provider  celecoxib (CELEBREX) 200 MG capsule Take 200 mg by mouth daily as needed for mild pain.    Yes [provider]  diazepam (VALIUM) 5 MG tablet Take 5 mg by mouth 4 (four) times daily as  needed for anxiety.  03/24/18  Yes [provider]  lidocaine-prilocaine (EMLA) cream Apply 1 application topically as needed. Patient taking differently: Apply 1 application topically daily as needed (access port).  08/04/18  Yes [name], MD  potassium chloride 20 MEQ/15ML (10%) SOLN Take 40 mEq by mouth daily.   Yes [provider]  prochlorperazine (COMPAZINE) 10 MG tablet Take 1 tablet (10 mg total) by mouth every 6 (six) hours as needed for nausea or vomiting. 06/11/18  Yes [name], MD  traMADol (ULTRAM) 50 MG tablet Take 50 mg by mouth every 6 (six)  hours as needed for moderate pain.    Yes [provider]  diphenhydrAMINE (BENADRYL) 25 MG tablet Take 25 mg by mouth every 6 (six) hours as needed for itching or allergies.     [provider]  loperamide (IMODIUM A-D) 2 MG tablet Take 2 mg by mouth 4 (four) times daily as needed for diarrhea or loose stools.    [provider]  magic mouthwash SOLN Take 5 mLs by mouth 4 (four) times daily. 1:1:1 mix Patient not taking: Reported on 08/24/2018 06/29/18   [name], MD  Potassium Chloride ER 20 MEQ TBCR Take 20 mEq by mouth daily. Unable to find liquid form of K+ in epic, called into [facility] Patient not taking: Reported on 08/24/2018 08/03/18   [name], MD  potassium chloride SA (K-DUR,KLOR-CON) 20 MEQ tablet Take 2 tablets (40 mEq total) by mouth daily. Patient not taking: Reported on 08/24/2018 07/27/18   Harle Stanford., PA-C    Physical Exam: Vitals:   08/24/18 1705 08/24/18 1730 08/24/18 1930  BP: (!) 143/103 (!) 150/119 (!) 152/110  Pulse: (!) 114 (!) 119 (!) 115  Resp: 12 (!) 28 (!) 26  Temp: 98.6 F (37 C)    TempSrc: Oral    SpO2: 95% 96% 97%  Weight: 63.5 kg    Height: 5\' 3"  (1.6 m)      Constitutional: NAD, calm, comfortable Vitals:   08/24/18 1705 08/24/18 1730 08/24/18 1930  BP: (!) 143/103 (!) 150/119 (!) 152/110  Pulse: (!) 114 (!) 119 (!) 115  Resp: 12 (!) 28 (!) 26  Temp: 98.6 F (37 C)    TempSrc: Oral    SpO2: 95% 96% 97%  Weight: 63.5 kg    Height: 5\' 3"  (1.6 m)     Eyes: PERRL, lids and conjunctivae normal ENMT: Mucous membranes are moist. Posterior pharynx clear of any exudate or lesions. Neck: normal, supple, no masses, no thyromegaly Respiratory: clear to auscultation bilaterally, no wheezing, no crackles. Normal respiratory effort. No accessory muscle use. Tenderness to palpation upper thoracic region of back. Cardiovascular: Regular rate and rhythm, no murmurs / rubs / gallops. No extremity edema. 2+  pedal pulses.  Port right upper chest, clean without erythema or  tenderness Abdomen: no tenderness, no masses palpated. No hepatosplenomegaly. Bowel sounds positive.  Musculoskeletal: no clubbing / cyanosis. No joint deformity upper and lower extremities. Good ROM, no contractures. Normal muscle tone.  Skin: no rashes, lesions, ulcers. No induration Neurologic: CN 2-12 grossly intact. Sensation intact, DTR normal. Strength 5/5 in all 4.  Psychiatric: Normal judgment and insight. Alert and oriented x 3. Normal mood.   Labs on Admission: I have personally reviewed following labs and imaging studies  CBC: Recent Labs  Lab 08/20/18 0824 08/24/18 1900  WBC 17.0* 22.4*  NEUTROABS 11.5*  --   HGB 7.0* 11.7*  HCT 21.2* 35.9*  MCV 94.2  94.7  PLT 39* 818*   Basic Metabolic Panel: Recent Labs  Lab 08/20/18 0824 08/24/18 1900  NA 139 136  K 3.1* 3.0*  CL 100 100  CO2 23 22  GLUCOSE 110* 148*  BUN 4* 7  CREATININE 0.74 0.74  CALCIUM 6.4* 5.4*   GFR: Estimated Creatinine Clearance: 67.9 mL/min (by C-G formula based on SCr of 0.74 mg/dL). Liver Function Tests: Recent Labs  Lab 08/20/18 0824 08/24/18 1900  AST 14* 19  ALT 21 17  ALKPHOS 205* 163*  BILITOT 0.3 0.9  PROT 6.7 7.1  ALBUMIN 3.4* 3.9   Radiological Exams on Admission: Ct Thoracic Spine Wo Contrast  Result Date: 08/24/2018 CLINICAL DATA:  Initial evaluation for acute mid back pain. No known injury. EXAM: CT THORACIC SPINE WITHOUT CONTRAST TECHNIQUE: Multidetector CT images of the thoracic were obtained using the standard protocol without intravenous contrast. COMPARISON:  Prior CT from 07/31/2018. FINDINGS: Alignment: S-shaped scoliosis of the thoracic spine noted. Alignment otherwise normal with preservation of the normal thoracic kyphosis. No listhesis or malalignment. Vertebrae: Acute compression deformity involving the superior endplate of T6. Associated height loss of up to 35% with trace 2 mm bony retropulsion.  Acute compression fracture of the superior endplate of T7 with up to 35% height loss with trace 2 mm bony retropulsion. Acute compression fracture involving the T8 vertebral body with up to 45% height loss with trace 2 mm bony retropulsion. Fractures are benign in appearance by CT with no underlying lesion identified. Mild height loss in conchae cavity at the superior endplate of T5 is chronic in appearance. Vertebral body heights otherwise maintained with no other acute or chronic fracture. No discrete lytic or blastic osseous lesions. Paraspinal and other soft tissues: Paraspinous soft tissues demonstrate no acute finding. Dependent atelectatic changes noted within the visualized lungs, right greater than left. Irregular pleuroparenchymal scarring noted at the right lung apex. Underlying emphysema. Disc levels: No significant stenosis seen within the thoracic spine. Mild facet hypertrophy within the lower thoracic spine, most notable at T9-10 and T10-11. IMPRESSION: 1. Acute compression deformity involving the superior endplate of T5 and T6 with up to 35% height loss with trace 2 mm bony retropulsion. 2. Additional acute compression fracture involving the T8 vertebral body with up to 45% height loss with trace 2 mm bony retropulsion. 3. Mild chronic height loss/compression deformity at the superior endplate of T5 without retropulsion. 4. Emphysema (ICD10-J43.9). Electronically Signed   By: Jeannine Boga M.D.   On: 08/24/2018 18:43   Ct Lumbar Spine Wo Contrast  Result Date: 08/24/2018 CLINICAL DATA:  Initial evaluation for acute mid back pain. History of prior compression fracture. EXAM: CT LUMBAR SPINE WITHOUT CONTRAST TECHNIQUE: Multidetector CT imaging of the lumbar spine was performed without intravenous contrast administration. Multiplanar CT image reconstructions were also generated. COMPARISON:  Prior CT from 07/31/2018 FINDINGS: Segmentation: Standard. Lowest well-formed disc labeled the L5-S1  level. Alignment: Mild dextroscoliosis. Alignment otherwise normal with preservation of the normal lumbar lordosis. No listhesis or subluxation. Vertebrae: Chronic compression deformity involving the superior endplate of L2 with up to approximately 40% height loss and trace 2 mm bony retropulsion, stable. Vertebral body heights otherwise maintained with no evidence for acute fracture. Visualized sacrum and pelvis intact. SI joints approximated and symmetric. No discrete lytic or blastic osseous lesions. Paraspinal and other soft tissues: Paraspinous soft tissues demonstrate no acute finding. Partially visualized visceral structures grossly unremarkable. Moderate aorto bi-iliac atherosclerotic disease. Disc levels: L1-2: Trace 2 mm bony retropulsion  related to the chronic L2 compression fracture. Mild facet hypertrophy. No significant stenosis. L2-3:  Mild annular disc bulge.  No stenosis. L3-4: Mild diffuse disc bulge. Mild bilateral facet hypertrophy. No significant stenosis. L4-5: Mild diffuse disc bulge. Mild bilateral facet and ligamentum flavum hypertrophy. No significant spinal stenosis. Mild bilateral L4 foraminal narrowing. L5-S1: Mild diffuse disc bulge, asymmetric to the left. Moderate facet and ligament flavum hypertrophy. No spinal stenosis. Mild to moderate bilateral L5 foraminal narrowing. IMPRESSION: 1. No acute abnormality within the lumbar spine. 2. Chronic L2 compression deformity, stable from previous. 3. Mild to moderate bilateral L4 and L5 foraminal narrowing due to disc bulge and facet hypertrophy. Electronically Signed   By: Jeannine Boga M.D.   On: 08/24/2018 18:52    EKG: Independently reviewed.  Sinus tachycardia.  Nonspecific T wave abnormalities anterior lateral leads.  No old EKG to compare.  Assessment/Plan Active Problems:   Intractable back pain  Intractable back pain- likely 2/2 multiple acute compressure fractures involving thoracic vertebrae as seen on Ct (see  detailed report).  No sensory or motor lower extremity deficits on exam.  Pain not controlled with fentanyl 50 mg x 2 given in ED. -Pain control morphine 4 mg every 3 hourly -Consider reconsulting neurosurgery recommendations in a.m, ?? If back brace is possible considering thoracic vetebrae involvement, or If benefeicial. - PT eval  Hypocalcemia-calcium 5.4.  Albumin 3.9. 1G calcium gluconate given in ED -Check vitamin D - check PTH -Pending blood work may need supplementation on discharge - BMP a.m - Check Mag  Hypokalemia- 3.  On chronic oral supplementation. -Check Magnesium - BMP a.m  Leukocytosis- gradual increase over the past month now up to 22.  Patient reports a nonproductive cough 1 day duration.  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  F/w Dr. Earlie Server.)  S/p Systemic chemo, prophylactic cranial radiation radiotherapy to pulmonary nodule. Currently on chemotherapy.  HIV as part of routine health screening.  DVT prophylaxis: Lovenox Code Status: Full Family Communication: None at bedside Disposition Plan: 1-2 days  Consults called: Neurosurgery Admission status: Obs, Med -surg   Bethena Roys MD [facility] Pager [phone] From 3PM- 11PM.  Otherwise please contact night-coverage [email or web address] Password [facility]  08/24/2018, 10:53 PM

## 2018-08-24 NOTE — ED Notes (Signed)
ED Provider at bedside. 

## 2018-08-24 NOTE — ED Notes (Signed)
ED TO INPATIENT HANDOFF REPORT  Name/Age/Gender Debra Kidd 57 y.o. female  Code Status   Home/SNF/Other Home  Chief Complaint cancer pt/ back pain   Level of Care/Admitting Diagnosis ED Disposition    ED Disposition Condition [facility] Area: [place] [100102]  Level of Care: Med-Surg [16]  Diagnosis: Intractable back pain [488891]  Admitting Physician: Bethena Roys [[id number]]  Attending Physician: Bethena Roys Nessa.Cuff  PT Class (Do Not Modify): Observation [104]  PT Acc Code (Do Not Modify): Observation [10022]       Medical History Past Medical History:  Diagnosis Date  . 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 Blood type, Rh positive   . Cancer ([place])   . Chronic pain syndrome   . Depression 08/07/2016  . Encounter for antineoplastic chemotherapy 07/17/2016  . History of external beam radiation therapy 11/19/16-12/02/16   brain 25 Gy in 10 fractions  . Hyperlipidemia   . Hypertension   . Hypertension 08/07/2016  . Osteoporosis     Allergies Allergies  Allergen Reactions  . Codeine Nausea And Vomiting  . Motrin [Ibuprofen]     Elevation of BP  . [name]     intolerant  . Vicodin [Hydrocodone-Acetaminophen] Nausea And Vomiting    IV Location/Drains/Wounds Patient Lines/Drains/Airways Status   Active Line/Drains/Airways    Name:   Placement date:   Placement time:   Site:   Days:   Implanted Port 06/03/16 Right Chest   06/03/16    -    Chest   812          Labs/Imaging Results for orders placed or performed during the hospital encounter of 08/24/18 (from the past 48 hour(s))  CBC     Status: Abnormal   Collection Time: 08/24/18  7:00 PM  Result Value Ref Range   WBC 22.4 (H) 4.0 - 10.5 K/uL   RBC 3.79 (L) 3.87 - 5.11 MIL/uL   Hemoglobin 11.7 (L) 12.0 - 15.0 g/dL   HCT 35.9 (L) 36.0 - 46.0 %   MCV 94.7 80.0 - 100.0 fL   MCH 30.9 26.0 - 34.0 pg   MCHC 32.6 30.0 -  36.0 g/dL   RDW 17.2 (H) 11.5 - 15.5 %   Platelets 144 (L) 150 - 400 K/uL   nRBC 0.0 0.0 - 0.2 %    Comment: Performed at [facility], [place] [address] [place], [place] [address]  Comprehensive metabolic panel     Status: Abnormal   Collection Time: 08/24/18  7:00 PM  Result Value Ref Range   Sodium 136 135 - 145 mmol/L   Potassium 3.0 (L) 3.5 - 5.1 mmol/L   Chloride 100 98 - 111 mmol/L   CO2 22 22 - 32 mmol/L   Glucose, Bld 148 (H) 70 - 99 mg/dL   BUN 7 6 - 20 mg/dL   Creatinine, Ser 0.74 0.44 - 1.00 mg/dL   Calcium 5.4 (LL) 8.9 - 10.3 mg/dL    Comment: CRITICAL RESULT CALLED TO, READ BACK BY AND VERIFIED WITH: L. COLES AT 2002 ON 08/24/18 BY N.THOMPSON    Total Protein 7.1 6.5 - 8.1 g/dL   Albumin 3.9 3.5 - 5.0 g/dL   AST 19 15 - 41 U/L   ALT 17 0 - 44 U/L   Alkaline Phosphatase 163 (H) 38 - 126 U/L   Total Bilirubin 0.9 0.3 - 1.2 mg/dL   GFR  calc non Af Amer >60 >60 mL/min   GFR calc Af Amer >60 >60 mL/min   Anion gap 14 5 - 15    Comment: Performed at [facility], [place] [address], [place], [place] [address]   Ct Thoracic Spine Wo Contrast  Result Date: 08/24/2018 CLINICAL DATA:  Initial evaluation for acute mid back pain. No known injury. EXAM: CT THORACIC SPINE WITHOUT CONTRAST TECHNIQUE: Multidetector CT images of the thoracic were obtained using the standard protocol without intravenous contrast. COMPARISON:  Prior CT from 07/31/2018. FINDINGS: Alignment: S-shaped scoliosis of the thoracic spine noted. Alignment otherwise normal with preservation of the normal thoracic kyphosis. No listhesis or malalignment. Vertebrae: Acute compression deformity involving the superior endplate of T6. Associated height loss of up to 35% with trace 2 mm bony retropulsion. Acute compression fracture of the superior endplate of T7 with up to 35% height loss with trace 2 mm bony retropulsion. Acute compression fracture involving the T8 vertebral body with up  to 45% height loss with trace 2 mm bony retropulsion. Fractures are benign in appearance by CT with no underlying lesion identified. Mild height loss in conchae cavity at the superior endplate of T5 is chronic in appearance. Vertebral body heights otherwise maintained with no other acute or chronic fracture. No discrete lytic or blastic osseous lesions. Paraspinal and other soft tissues: Paraspinous soft tissues demonstrate no acute finding. Dependent atelectatic changes noted within the visualized lungs, right greater than left. Irregular pleuroparenchymal scarring noted at the right lung apex. Underlying emphysema. Disc levels: No significant stenosis seen within the thoracic spine. Mild facet hypertrophy within the lower thoracic spine, most notable at T9-10 and T10-11. IMPRESSION: 1. Acute compression deformity involving the superior endplate of T5 and T6 with up to 35% height loss with trace 2 mm bony retropulsion. 2. Additional acute compression fracture involving the T8 vertebral body with up to 45% height loss with trace 2 mm bony retropulsion. 3. Mild chronic height loss/compression deformity at the superior endplate of T5 without retropulsion. 4. Emphysema (ICD10-J43.9). Electronically Signed   By: Jeannine Boga M.D.   On: 08/24/2018 18:43   Ct Lumbar Spine Wo Contrast  Result Date: 08/24/2018 CLINICAL DATA:  Initial evaluation for acute mid back pain. History of prior compression fracture. EXAM: CT LUMBAR SPINE WITHOUT CONTRAST TECHNIQUE: Multidetector CT imaging of the lumbar spine was performed without intravenous contrast administration. Multiplanar CT image reconstructions were also generated. COMPARISON:  Prior CT from 07/31/2018 FINDINGS: Segmentation: Standard. Lowest well-formed disc labeled the L5-S1 level. Alignment: Mild dextroscoliosis. Alignment otherwise normal with preservation of the normal lumbar lordosis. No listhesis or subluxation. Vertebrae: Chronic compression deformity  involving the superior endplate of L2 with up to approximately 40% height loss and trace 2 mm bony retropulsion, stable. Vertebral body heights otherwise maintained with no evidence for acute fracture. Visualized sacrum and pelvis intact. SI joints approximated and symmetric. No discrete lytic or blastic osseous lesions. Paraspinal and other soft tissues: Paraspinous soft tissues demonstrate no acute finding. Partially visualized visceral structures grossly unremarkable. Moderate aorto bi-iliac atherosclerotic disease. Disc levels: L1-2: Trace 2 mm bony retropulsion related to the chronic L2 compression fracture. Mild facet hypertrophy. No significant stenosis. L2-3:  Mild annular disc bulge.  No stenosis. L3-4: Mild diffuse disc bulge. Mild bilateral facet hypertrophy. No significant stenosis. L4-5: Mild diffuse disc bulge. Mild bilateral facet and ligamentum flavum hypertrophy. No significant spinal stenosis. Mild bilateral L4 foraminal narrowing. L5-S1: Mild diffuse disc bulge, asymmetric to the left. Moderate  facet and ligament flavum hypertrophy. No spinal stenosis. Mild to moderate bilateral L5 foraminal narrowing. IMPRESSION: 1. No acute abnormality within the lumbar spine. 2. Chronic L2 compression deformity, stable from previous. 3. Mild to moderate bilateral L4 and L5 foraminal narrowing due to disc bulge and facet hypertrophy. Electronically Signed   By: Jeannine Boga M.D.   On: 08/24/2018 18:52    Pending Labs Unresulted Labs (From admission, onward)    Start     Ordered   08/25/18 0500  CBC  Tomorrow morning,   R     08/24/18 2248   08/24/18 2221  VITAMIN D 25 Hydroxy (Vit-D Deficiency, Fractures)  Once,   R     08/24/18 2223   08/24/18 2220  Parathyroid hormone, intact (no Ca)  Add-on,   R     08/24/18 2223   08/24/18 2127  Magnesium  Add-on,   R     08/24/18 2126   08/24/18 1731  Urinalysis, Routine w reflex microscopic  ONCE - STAT,   R     08/24/18 1730   Signed and Held  HIV  antibody (Routine Testing)  Once,   R     Signed and Held   Signed and Held  Basic metabolic panel  Tomorrow morning,   R     Signed and Held   Signed and Held  CBC  Tomorrow morning,   R     Signed and Held          Vitals/Pain Today's Vitals   08/24/18 2000 08/24/18 2030 08/24/18 2100 08/24/18 2248  BP: (!) 141/104 (!) 136/102 (!) 136/106   Pulse: (!) 110 (!) 108 (!) 114   Resp: (!) 23 (!) 24 14   Temp:      TempSrc:      SpO2: 94% 95% 95%   Weight:      Height:      PainSc:    6     Isolation Precautions No active isolations  Medications Medications  morphine 4 MG/ML injection 4 mg (has no administration in time range)  potassium chloride SA (K-DUR,KLOR-CON) CR tablet 40 mEq (has no administration in time range)  fentaNYL (SUBLIMAZE) injection 50 mcg (50 mcg Intravenous Given 08/24/18 1856)  ondansetron (ZOFRAN) injection 4 mg (4 mg Intravenous Given 08/24/18 1856)  fentaNYL (SUBLIMAZE) injection 50 mcg (50 mcg Intravenous Given 08/24/18 2100)  potassium chloride SA (K-DUR,KLOR-CON) CR tablet 40 mEq (40 mEq Oral Given 08/24/18 2107)  sodium chloride 0.9 % bolus 1,000 mL (0 mLs Intravenous Stopped 08/24/18 2207)  calcium gluconate 1 g/ 50 mL sodium chloride IVPB (0 g Intravenous Stopped 08/24/18 2210)    Mobility walks

## 2018-08-24 NOTE — ED Provider Notes (Signed)
[facility] Emergency Department Provider Note MRN:  [id number]  [place] date & time: 08/24/18     Chief Complaint   Back pain History of Present Illness   Debra Kidd is a 57 y.o. year-old female with a history of small cell lung cancer presenting to the ED with chief complaint of back pain.  Patient was in bed earlier today, was rolling over and then experienced sudden onset pain in the lower back.  Pain is severe, 10 out of 10, worse with motion.  Denies headache or vision change, no chest pain or shortness of breath, no abdominal pain, no bowel or bladder dysfunction.  Paresthesia of left foot, no numbness or weakness to the arms or legs.  Review of Systems  A complete 10 system review of systems was obtained and all systems are negative except as noted in the HPI and PMH.   Patient's Health History    Past Medical History:  Diagnosis Date  . 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 Blood type, Rh positive   . Cancer ([place])   . Chronic pain syndrome   . Depression 08/07/2016  . Encounter for antineoplastic chemotherapy 07/17/2016  . History of external beam radiation therapy 11/19/16-12/02/16   brain 25 Gy in 10 fractions  . Hyperlipidemia   . Hypertension   . Hypertension 08/07/2016  . Osteoporosis     Past Surgical History:  Procedure Laterality Date  . ABDOMINAL HYSTERECTOMY  1981  . APPENDECTOMY     at age 5  . EYE SURGERY     left  . IR GENERIC HISTORICAL  06/03/2016   IR FLUORO GUIDE PORT INSERTION RIGHT 06/03/2016 [facility]-INTERV RAD  . IR GENERIC HISTORICAL  06/03/2016   IR US GUIDE VASC ACCESS RIGHT 06/03/2016 [facility]INTER[facility]  . SKIN CANCER EXCISION     basal cell carcinoma L side of face    Family History  Problem Relation Age of Onset  . Heart disease Mother   . Heart disease Father   . Emphysema Brother   . Breast cancer Maternal Grandmother   . Heart disease Brother     Social History   Socioeconomic History  . Marital  status: Married    Spouse name: Not on file  . Number of children: 2  . Years of education: Not on file  . Highest education level: Not on file  Occupational History  . Not on file  Social Needs  . Financial resource strain: Not on file  . Food insecurity:    Worry: Not on file    Inability: Not on file  . Transportation needs:    Medical: Not on file    Non-medical: Not on file  Tobacco Use  . Smoking status: Current Every Day Smoker    Packs/day: 1.00    Years: 43.00    Pack years: 43.00  . Smokeless tobacco: Never Used  Substance and Sexual Activity  . Alcohol use: Not Currently    Comment: quit drinking beer 02/2016  . Drug use: Yes    Frequency: 7.0 times per week    Types: Marijuana  . Sexual activity: Not on file  Lifestyle  . Physical activity:    Days per week: Not on file    Minutes per session: Not on file  . Stress: Not on file  Relationships  . Social connections:    Talks on phone: Not on file    Gets together: Not on  file    Attends religious service: Not on file    Active member of club or organization: Not on file    Attends meetings of clubs or organizations: Not on file    Relationship status: Not on file  . Intimate partner violence:    Fear of current or ex partner: Not on file    Emotionally abused: Not on file    Physically abused: Not on file    Forced sexual activity: Not on file  Other Topics Concern  . 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  Currently on disability     Physical Exam  Vital Signs and Nursing Notes reviewed Vitals:   08/24/18 2100 08/24/18 2333  BP: (!) 136/106 129/90  Pulse: (!) 114 (!) 105  Resp: 14 17  Temp:  98.9 F (37.2 C)  SpO2: 95% 94%    CONSTITUTIONAL: Chronically ill-appearing, NAD NEURO:  Alert and oriented x 3, no focal deficits EYES:  eyes equal and reactive ENT/NECK:  no LAD, no JVD CARDIO:  Tachycardic rate, well-perfused, normal S1 and S2 PULM:  CTAB no wheezing or rhonchi GI/GU:  normal bowel sounds, non-distended, non-tender MSK/SPINE:  No gross deformities, no edema; tenderness to palpation to the midline T and L-spine SKIN:  no rash, atraumatic PSYCH:  Appropriate speech and behavior  Diagnostic and Interventional Summary    Labs Reviewed  CBC - Abnormal; Notable for the following components:      Result Value   WBC 22.4 (*)    RBC 3.79 (*)    Hemoglobin 11.7 (*)    HCT 35.9 (*)    RDW 17.2 (*)    Platelets 144 (*)    All other components within normal limits  COMPREHENSIVE METABOLIC PANEL - Abnormal; Notable for the following components:   Potassium 3.0 (*)    Glucose, Bld 148 (*)    Calcium 5.4 (*)    Alkaline Phosphatase 163 (*)    All other components within normal limits  MAGNESIUM - Abnormal; Notable for the following components:   Magnesium 0.9 (*)    All other components within normal limits  PARATHYROID HORMONE, INTACT (NO CA)  VITAMIN D 25 HYDROXY (VIT D DEFICIENCY, FRACTURES)  CBC  HIV ANTIBODY (ROUTINE TESTING W REFLEX)  BASIC METABOLIC PANEL    DG CHEST PORT 1 VIEW  Final Result    CT Thoracic Spine Wo Contrast  Final Result    CT Lumbar Spine Wo Contrast  Final Result      Medications  enoxaparin (LOVENOX) injection 40 mg (has no administration in time range)  acetaminophen (TYLENOL) tablet 650 mg (has no administration in time range)    Or  acetaminophen (TYLENOL) suppository 650 mg (has no administration in time range)  ondansetron (ZOFRAN) tablet 4 mg (has no administration in time range)    Or  ondansetron (ZOFRAN) injection 4 mg (has no administration in time range)  polyethylene glycol (MIRALAX / GLYCOLAX) packet 17 g (has no administration in time range)  morphine 4 MG/ML injection 4 mg (has no administration in time range)  potassium chloride SA (K-DUR,KLOR-CON) CR tablet 40 mEq (has no administration in time range)    cyclobenzaprine (FLEXERIL) tablet 5 mg (has no administration in time range)  fentaNYL (SUBLIMAZE) injection 50 mcg (50 mcg Intravenous Given 08/24/18 1856)  ondansetron (ZOFRAN) injection 4 mg (4 mg Intravenous Given  08/24/18 1856)  fentaNYL (SUBLIMAZE) injection 50 mcg (50 mcg Intravenous Given 08/24/18 2100)  potassium chloride SA (K-DUR,KLOR-CON) CR tablet 40 mEq (40 mEq Oral Given 08/24/18 2107)  sodium chloride 0.9 % bolus 1,000 mL (0 mLs Intravenous Stopped 08/24/18 2207)  calcium gluconate 1 g/ 50 mL sodium chloride IVPB (0 g Intravenous Stopped 08/24/18 2210)     Procedures Critical Care  ED Course and Medical Decision Making  I have reviewed the triage vital signs and the nursing notes.  Pertinent labs & imaging results that were available during my care of the patient were reviewed by me and considered in my medical decision making (see below for details).  Suspect acute muscle strain versus pathologic fracture compression fracture in this 57 year old female with known small cell lung cancer, per chart review no bony metastasis as of last month.  No chest pain or shortness of breath or leg pain or swelling to suggest pulmonary bruising, no dysuria or CVA tenderness to suggest stone or [place].  CT reveals multiple compression fractures of the thoracic spine.  Patient continues to have significant pain.  Labs also reveal hypokalemia, hypocalcemia.  Repleted in the ED.  Admitted to hospitalist service for further care and evaluation.  Barth Kirks. Sedonia Small, MD [place] [email or web address]  Final Clinical Impressions(s) / ED Diagnoses     ICD-10-CM   1. Hypokalemia E87.6   2. Cough R05 DG CHEST PORT 1 VIEW    DG CHEST PORT 1 VIEW  3. Hypocalcemia E83.51   4. Compression fracture of thoracic vertebra, initial encounter, unspecified thoracic vertebral level [facility]) S22.000A     ED Discharge Orders    None         Maudie Flakes,  MD 08/24/18 2342

## 2018-08-24 NOTE — ED Notes (Signed)
Bed: WA25 Expected date:  Expected time:  Means of arrival:  Comments: Triage 2 

## 2018-08-24 NOTE — ED Triage Notes (Signed)
Patient c/o mid back pain since this AM. patient c/o non productive cough that started today.

## 2018-08-24 NOTE — ED Notes (Signed)
RN made aware of pts elevated BP 

## 2018-08-24 NOTE — ED Notes (Signed)
Pt currently vomiting. States she is nauseous from the pain. Vomit is dark brown. RN made aware.

## 2018-08-24 NOTE — Telephone Encounter (Addendum)
Pt is not doing any better. She cannot walk because of back pain. She has voided today. She is groaning in pain. I instructed her to go to ED for an evaluation . She said she would .

## 2018-08-24 NOTE — Telephone Encounter (Addendum)
"  back went out" -pt did not come in today due to her "back went out".Thsi is a new problem for her.  She said she is having a lot of back pain . She said she is moving her legs and can bend her knees. However, it  is very painful for her to move.  She is laying in the bed. She has not voided this am. I asked her to call me back in a few hours with update. Dr Julien Nordmann notified. I sent schedule message for appt tomorrow.

## 2018-08-25 ENCOUNTER — Inpatient Hospital Stay: Payer: Medicare Other

## 2018-08-25 DIAGNOSIS — Z888 Allergy status to other drugs, medicaments and biological substances status: Secondary | ICD-10-CM | POA: Diagnosis not present

## 2018-08-25 DIAGNOSIS — F419 Anxiety disorder, unspecified: Secondary | ICD-10-CM | POA: Diagnosis present

## 2018-08-25 DIAGNOSIS — Z885 Allergy status to narcotic agent status: Secondary | ICD-10-CM | POA: Diagnosis not present

## 2018-08-25 DIAGNOSIS — Z79899 Other long term (current) drug therapy: Secondary | ICD-10-CM | POA: Diagnosis not present

## 2018-08-25 DIAGNOSIS — C7889 Secondary malignant neoplasm of other digestive organs: Secondary | ICD-10-CM | POA: Diagnosis present

## 2018-08-25 DIAGNOSIS — I10 Essential (primary) hypertension: Secondary | ICD-10-CM | POA: Diagnosis present

## 2018-08-25 DIAGNOSIS — E559 Vitamin D deficiency, unspecified: Secondary | ICD-10-CM | POA: Diagnosis present

## 2018-08-25 DIAGNOSIS — D72829 Elevated white blood cell count, unspecified: Secondary | ICD-10-CM | POA: Diagnosis present

## 2018-08-25 DIAGNOSIS — S22000A Wedge compression fracture of unspecified thoracic vertebra, initial encounter for closed fracture: Secondary | ICD-10-CM | POA: Diagnosis present

## 2018-08-25 DIAGNOSIS — G894 Chronic pain syndrome: Secondary | ICD-10-CM | POA: Diagnosis present

## 2018-08-25 DIAGNOSIS — E876 Hypokalemia: Secondary | ICD-10-CM | POA: Diagnosis present

## 2018-08-25 DIAGNOSIS — M4854XA Collapsed vertebra, not elsewhere classified, thoracic region, initial encounter for fracture: Secondary | ICD-10-CM | POA: Diagnosis present

## 2018-08-25 DIAGNOSIS — K5900 Constipation, unspecified: Secondary | ICD-10-CM | POA: Diagnosis present

## 2018-08-25 DIAGNOSIS — E785 Hyperlipidemia, unspecified: Secondary | ICD-10-CM | POA: Diagnosis present

## 2018-08-25 DIAGNOSIS — T451X5A Adverse effect of antineoplastic and immunosuppressive drugs, initial encounter: Secondary | ICD-10-CM | POA: Diagnosis present

## 2018-08-25 DIAGNOSIS — F17210 Nicotine dependence, cigarettes, uncomplicated: Secondary | ICD-10-CM | POA: Diagnosis present

## 2018-08-25 DIAGNOSIS — C3491 Malignant neoplasm of unspecified part of right bronchus or lung: Secondary | ICD-10-CM | POA: Diagnosis present

## 2018-08-25 DIAGNOSIS — M810 Age-related osteoporosis without current pathological fracture: Secondary | ICD-10-CM | POA: Diagnosis present

## 2018-08-25 DIAGNOSIS — Z85828 Personal history of other malignant neoplasm of skin: Secondary | ICD-10-CM | POA: Diagnosis not present

## 2018-08-25 DIAGNOSIS — Z791 Long term (current) use of non-steroidal anti-inflammatories (NSAID): Secondary | ICD-10-CM | POA: Diagnosis not present

## 2018-08-25 LAB — BASIC METABOLIC PANEL
Anion gap: 11 (ref 5–15)
BUN: 6 mg/dL (ref 6–20)
CHLORIDE: 102 mmol/L (ref 98–111)
CO2: 23 mmol/L (ref 22–32)
CREATININE: 0.67 mg/dL (ref 0.44–1.00)
Calcium: 5.2 mg/dL — CL (ref 8.9–10.3)
GFR calc Af Amer: >60 mL/min (ref >60)
GFR calc non Af Amer: >60 mL/min (ref >60)
Glucose, Bld: 123 mg/dL — ABNORMAL HIGH (ref 70–99)
Potassium: 3 mmol/L — ABNORMAL LOW (ref 3.5–5.1)
SODIUM: 136 mmol/L (ref 135–145)

## 2018-08-25 LAB — PHOSPHORUS: Phosphorus: 3.1 mg/dL (ref 2.5–4.6)

## 2018-08-25 LAB — CBC
HCT: 30.8 % — ABNORMAL LOW (ref 36.0–46.0)
Hemoglobin: 9.9 g/dL — ABNORMAL LOW (ref 12.0–15.0)
MCH: 30.4 pg (ref 26.0–34.0)
MCHC: 32.1 g/dL (ref 30.0–36.0)
MCV: 94.5 fL (ref 80.0–100.0)
Platelets: 126 10*3/uL — ABNORMAL LOW (ref 150–400)
RBC: 3.26 MIL/uL — ABNORMAL LOW (ref 3.87–5.11)
RDW: 17.1 % — ABNORMAL HIGH (ref 11.5–15.5)
WBC: 13.3 10*3/uL — ABNORMAL HIGH (ref 4.0–10.5)
nRBC: 0 % (ref 0.0–0.2)

## 2018-08-25 LAB — CALCIUM: Calcium: 6.9 mg/dL — ABNORMAL LOW (ref 8.9–10.3)

## 2018-08-25 LAB — HIV ANTIBODY (ROUTINE TESTING W REFLEX): HIV Screen 4th Generation wRfx: NONREACTIVE

## 2018-08-25 LAB — POTASSIUM: Potassium: 3 mmol/L — ABNORMAL LOW (ref 3.5–5.1)

## 2018-08-25 LAB — MAGNESIUM: Magnesium: 2.6 mg/dL — ABNORMAL HIGH (ref 1.7–2.4)

## 2018-08-25 MED ORDER — POTASSIUM CHLORIDE CRYS ER 20 MEQ PO TBCR
40.0000 meq | EXTENDED_RELEASE_TABLET | Freq: Once | ORAL | Status: AC
  Administered 2018-08-25: 40 meq via ORAL
  Filled 2018-08-25: qty 2

## 2018-08-25 MED ORDER — SENNOSIDES-DOCUSATE SODIUM 8.6-50 MG PO TABS
2.0000 | ORAL_TABLET | Freq: Every day | ORAL | Status: DC
  Administered 2018-08-25 – 2018-08-26 (×2): 2 via ORAL
  Filled 2018-08-25 (×2): qty 2

## 2018-08-25 MED ORDER — OXYCODONE HCL 5 MG PO TABS
5.0000 mg | ORAL_TABLET | ORAL | Status: DC | PRN
  Administered 2018-08-25 – 2018-08-26 (×4): 5 mg via ORAL
  Filled 2018-08-25 (×4): qty 1

## 2018-08-25 MED ORDER — CALCIUM CARBONATE-VITAMIN D 500-200 MG-UNIT PO TABS
2.0000 | ORAL_TABLET | Freq: Three times a day (TID) | ORAL | Status: DC
  Administered 2018-08-25 – 2018-08-26 (×6): 2 via ORAL
  Filled 2018-08-25 (×6): qty 2

## 2018-08-25 MED ORDER — METHOCARBAMOL 500 MG PO TABS
500.0000 mg | ORAL_TABLET | Freq: Four times a day (QID) | ORAL | Status: DC
  Administered 2018-08-25 – 2018-08-27 (×7): 500 mg via ORAL
  Filled 2018-08-25 (×8): qty 1

## 2018-08-25 MED ORDER — POTASSIUM CHLORIDE CRYS ER 20 MEQ PO TBCR
40.0000 meq | EXTENDED_RELEASE_TABLET | Freq: Once | ORAL | Status: DC
  Administered 2018-08-25: 40 meq via ORAL
  Filled 2018-08-25: qty 4

## 2018-08-25 MED ORDER — POLYETHYLENE GLYCOL 3350 17 G PO PACK
17.0000 g | PACK | Freq: Every day | ORAL | Status: DC
  Administered 2018-08-25 – 2018-08-26 (×2): 17 g via ORAL
  Filled 2018-08-25 (×2): qty 1

## 2018-08-25 MED ORDER — POTASSIUM CHLORIDE 20 MEQ/15ML (10%) PO SOLN: 40.0000 meq | Freq: Once | ORAL | Status: AC

## 2018-08-25 MED ORDER — MAGNESIUM SULFATE 2 GM/50ML IV SOLN
2.0000 g | Freq: Once | INTRAVENOUS | Status: AC
  Administered 2018-08-25: 2 g via INTRAVENOUS
  Filled 2018-08-25: qty 50

## 2018-08-25 MED ORDER — MORPHINE SULFATE (PF) 2 MG/ML IV SOLN
2.0000 mg | INTRAVENOUS | Status: DC | PRN
  Administered 2018-08-25 – 2018-08-27 (×6): 2 mg via INTRAVENOUS
  Filled 2018-08-25 (×6): qty 1

## 2018-08-25 MED ORDER — OXYCODONE HCL 5 MG PO TABS
10.0000 mg | ORAL_TABLET | Freq: Once | ORAL | Status: AC
  Administered 2018-08-25: 10 mg via ORAL
  Filled 2018-08-25: qty 2

## 2018-08-25 MED ORDER — CALCIUM GLUCONATE-NACL 2-0.675 GM/100ML-% IV SOLN
2.0000 g | Freq: Once | INTRAVENOUS | Status: AC
  Administered 2018-08-25: 2000 mg via INTRAVENOUS
  Filled 2018-08-25: qty 100

## 2018-08-25 NOTE — Evaluation (Signed)
Physical Therapy Evaluation Patient Details Name: Debra Kidd MRN: [id number] DOB: December 30, 1960 Today's Date: 08/25/2018   History of Present Illness  57 y.o. 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 Dx of compression fractures at T5, T6, T8.  Clinical Impression  Pt admitted with above diagnosis. Pt currently with functional limitations due to the deficits listed below (see PT Problem List). Pt ambulated 14' x 2 with RW, distance limited by pain. Instructed pt in log roll to minimize strain to back with bed mobility. Pt reports sitting is her most comfortable position. Encouraged pt to ambulate as tolerated. Pt will benefit from skilled PT to increase their independence and safety with mobility to allow discharge to the venue listed below.       Follow Up Recommendations Home health PT    Equipment Recommendations  Rolling walker with 5" wheels;3in1 (PT)    Recommendations for Other Services       Precautions / Restrictions Precautions Precautions: Back Precaution Comments: compression fxs; denies h/o falls in past 1 year Restrictions Weight Bearing Restrictions: No      Mobility  Bed Mobility Overal bed mobility: Needs Assistance Bed Mobility: Rolling;Sidelying to Sit Rolling: Min assist Sidelying to sit: Min assist       General bed mobility comments: VCs technique for log roll  Transfers Overall transfer level: Needs assistance Equipment used: Rolling walker (2 wheeled) Transfers: Sit to/from Stand Sit to Stand: Supervision         General transfer comment: VCs hand placement  Ambulation/Gait   Gait Distance (Feet): 28 Feet Assistive device: Rolling walker (2 wheeled) Gait Pattern/deviations: Step-through pattern;Decreased stride length Gait velocity: decr    General Gait Details: steady with RW, VCs for positioning in RW, distance limited by pain  Stairs            Wheelchair Mobility    Modified Rankin (Stroke Patients Only)       Balance Overall balance assessment: Modified Independent                                           Pertinent Vitals/Pain Pain Assessment: 0-10 Pain Score: 8  Pain Location: back  Pain Descriptors / Indicators: Burning Pain Intervention(s): Limited activity within patient's tolerance;Monitored during session;Patient requesting pain meds-RN notified;RN gave pain meds during session;Heat applied    Home Living Family/patient expects to be discharged to:: Private residence Living Arrangements: Spouse/significant other;Children Available Help at Discharge: Family;Available 24 hours/day   Home Access: Level entry     Home Layout: One level Home Equipment: Shower seat      Prior Function Level of Independence: Independent               Hand Dominance        Extremity/Trunk Assessment   Upper Extremity Assessment Upper Extremity Assessment: Overall WFL for tasks assessed    Lower Extremity Assessment Lower Extremity Assessment: Overall WFL for tasks assessed(sensation intact to light touch)    Cervical / Trunk Assessment Cervical / Trunk Assessment: Normal  Communication   Communication: No difficulties  Cognition Arousal/Alertness: Awake/alert Behavior During Therapy: WFL for tasks assessed/performed Overall Cognitive Status: Within Functional Limits for tasks assessed  General Comments      Exercises     Assessment/Plan    PT Assessment Patient needs continued PT services  PT Problem List Pain;Decreased mobility;Decreased activity tolerance;Decreased knowledge of use of DME;Decreased knowledge of precautions       PT Treatment Interventions DME instruction;Gait training;Functional mobility  training;Therapeutic exercise;Therapeutic activities;Patient/family education    PT Goals (Current goals can be found in the Care Plan section)  Acute Rehab PT Goals Patient Stated Goal: watch tv PT Goal Formulation: With patient Time For Goal Achievement: 09/08/18 Potential to Achieve Goals: Good    Frequency Min 3X/week   Barriers to discharge        Co-evaluation               AM-PAC PT "6 Clicks" Mobility  Outcome Measure Help needed turning from your back to your side while in a flat bed without using bedrails?: A Little Help needed moving from lying on your back to sitting on the side of a flat bed without using bedrails?: A Little Help needed moving to and from a bed to a chair (including a wheelchair)?: A Little Help needed standing up from a chair using your arms (e.g., wheelchair or bedside chair)?: A Little Help needed to walk in hospital room?: A Little Help needed climbing 3-5 steps with a railing? : A Lot 6 Click Score: 17    End of Session Equipment Utilized During Treatment: Gait belt Activity Tolerance: Patient limited by pain Patient left: in chair;with chair alarm set;with call bell/phone within reach Nurse Communication: Mobility status PT Visit Diagnosis: Pain;Difficulty in walking, not elsewhere classified (R26.2)    Time: 9914-4458 PT Time Calculation (min) (ACUTE ONLY): 35 min   Charges:   PT Evaluation $PT Eval Low Complexity: 1 Low PT Treatments $Gait Training: 8-22 mins       Blondell Reveal Kistler PT 08/25/2018  Acute Rehabilitation Services Pager [phone] Office [phone]

## 2018-08-25 NOTE — Progress Notes (Signed)
CRITICAL VALUE ALERT  Critical Value:  Calcium 5.2  Date & Time Notied:  08/25/2018 5:50am  Provider Notified: Dr. Hilbert Bible  Orders Received/Actions taken: pending

## 2018-08-25 NOTE — Progress Notes (Signed)
Patient Demographics:    Debra Kidd, is a 57 y.o. female, DOB - November 19, 1960, VVO:[id number]  Admit date - 08/24/2018   Admitting Physician Ejiroghene Arlyce Dice, MD  Outpatient Primary MD for the patient is [name], MD  LOS - 0   Chief Complaint  Patient presents with  . cancer patient  . 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 female with medical history significant for small cell lung cancer, depression, hypertension, admitted on 08/24/2018 with sudden onset back pain and found to have T5, T6 and T8 acute compression fractures as well as hypocalcemia, hypomagnesemia and hypokalemia.  Patient has no vomiting no diarrhea or significant GI losses.   Plan:- 1)Severe Hypocalcemia--- albumin is 3.9,  initially calcium was 5.4,  repeat 5.2 despite replacement with iv calcium, in retrospect severe hypocalcemia is secondary to severe hypomagnesemia, the severe hypomagnesemia itself is induced by cisplatin therapy..... Keep serum magnesium above 2 and then continue to replace calcium IV and orally... PTH and vitamin the levels pending, after replacement of magnesium and additional calcium replacement calcium is now up to 6.9, if calcium drops again please check magnesium  2)Hypomagnesemia and hypokalemia-----again hypomagnesemia secondary to cisplatin therapy, the  hypomagnesemia is perpetuating patient's hypokalemia so we will continue to keep magnesium above 2 in order to avoid persistent hypokalemia... After replacement serum magnesium is up to 2.6,  3)Acute  thoracic compression fractures with intractable back pain --- patient with acute compression fractures at T5, T6 and T8, EDP discussed case with on-call neurosurgeon on admission, please recall neurosurgeon if official consult is not in by 08/26/2018, patient may need a back brace, PT OT eval appreciated, methocarbamol and PRN oxycodone and IV morphine as needed for pain  4) small cell lung cancer--- I called and discussed this case with Dr. Earlie Server, he will aggressively try to replace magnesium with each outpatient treatment cycle of cisplatin.   5) leukocytosis--- white count is down to 13.3 from 22.4 chest less than 24 hours ago, suspect reactive leukocytosis, patient without fevers or any definite focus of infection at this time, continue to monitor  Disposition/Need for in-Hospital Stay- patient unable to be discharged at this time due to severe electrolyte abnormalities including severe hypocalcemia and severe hypomagnesemia and severe hypokalemia with risk for significant arrhythmias and sudden death if not replaced and monitored carefully on the telemetry monitored unit  Code Status : Full  Family Communication:   None at bedside   Disposition Plan  : Possibly discharge home with home health services  Consults  : We will consult with oncologist Dr. Earlie Server, neurosurgery consult pending as per EDP  DVT Prophylaxis  :  Lovenox    Lab Results  Component Value Date   PLT 126 (L) 08/25/2018    Inpatient Medications  Scheduled Meds: . calcium-vitamin D  2 tablet Oral TID  . cyclobenzaprine  5 mg Oral QHS  . enoxaparin (LOVENOX) injection  40 mg Subcutaneous QHS  . potassium chloride  40 mEq Oral Once  . potassium chloride  40 mEq Oral Once   Continuous Infusions: PRN  Meds:.acetaminophen **OR** acetaminophen, morphine injection, ondansetron **OR** ondansetron (ZOFRAN) IV, polyethylene glycol   Anti-infectives (From admission, onward)   None        Objective:   Vitals:   08/24/18 2100 08/24/18 2333 08/25/18 0503 08/25/18 1401  BP: (!) 136/106 129/90 126/86 121/89  Pulse: (!) 114 (!) 105 98 (!) 117  Resp: 14 17 16 16   Temp:  98.9 F (37.2 C) 98.4 F (36.9 C) 98.6 F (37 C)  TempSrc:  Oral Oral Oral  SpO2: 95% 94% 96% 94%  Weight:      Height:        Wt Readings from Last 3 Encounters:  08/24/18 63.5 kg  08/03/18 64.3 kg  07/27/18 65.1 kg    Intake/Output Summary (Last 24 hours) at 08/25/2018 1747 Last data filed at 08/25/2018 2330 Gross per 24 hour  Intake 1290 ml  Output 1 ml  Net 1289 ml     Physical Exam Patient is examined daily including today on 08/25/18 , exams remain the same as of yesterday except that has changed   Gen:- Awake Alert,  In no apparent distress  HEENT:- [place].AT, No sclera icterus Neck-Supple Neck,No JVD,.  Lungs-  CTAB , fair symmetrical air movemen CV- S1, S2 normal, regular  Abd-  +ve B.Sounds, Abd Soft, No tenderness,    Extremity/Skin:- No  edema, pedal pulses present  Psych-affect is appropriate, oriented x3 Neuro-generalized weakness but no new focal deficits, no tremors MSK- point tenderness on palpation over the mid and lower thoracic spine area, overlying skin without erythema or swelling   Data Review:   Micro Results No results found for this or any previous visit (from the past 240 hour(s)).  Radiology Reports Ct Chest W Contrast  Result Date: 07/31/2018 CLINICAL DATA:  Patient with history of small cell lung cancer. EXAM: CT CHEST, ABDOMEN, AND PELVIS WITH CONTRAST TECHNIQUE: Multidetector CT imaging of the chest, abdomen and pelvis was performed following the standard protocol during bolus administration of intravenous contrast. CONTRAST:  160mL ISOVUE-300 IOPAMIDOL (ISOVUE-300)  INJECTION 61% COMPARISON:  CT CAP 06/09/2018 FINDINGS: CT CHEST FINDINGS Cardiovascular: Right anterior chest wall Port-A-Cath is present with tip terminating in the superior vena cava. Normal heart size. Trace fluid superior pericardial recess. Thoracic aortic vascular calcifications. Mediastinum/Nodes: No enlarged axillary, mediastinal or hilar lymphadenopathy. Small hiatal hernia. Lungs/Pleura: Central airways are patent. Dependent atelectasis/scarring within the bilateral lower lobes. Similar-appearing nodule within the right upper lobe measuring 1.9 x 1.4 cm (image 24; series 4). Similar-appearing surrounding ground-glass attenuation and architectural distortion compatible with post radiation changes. No pleural effusion or pneumothorax. Musculoskeletal: No aggressive or acute appearing osseous lesions. CT ABDOMEN PELVIS FINDINGS Hepatobiliary: Liver is normal in size and contour. Similar-appearing low-attenuation adjacent to the falciform ligament, potentially fatty deposition or perfusion anomaly. Gallbladder is unremarkable. No intrahepatic or extrahepatic biliary ductal dilatation. Pancreas: Unremarkable Spleen: Unremarkable Adrenals/Urinary Tract: Normal adrenal glands. Kidneys enhance symmetrically with contrast. No hydronephrosis. Urinary bladder is unremarkable. Stomach/Bowel: Slight interval decrease in pericolonic mesenteric fat stranding (image 99; series 2). Interval decrease in fat stranding anterior to the right iliacus musculature (image  84; series 2). There is interval decrease in small bowel mesenteric fat stranding/haziness (image 77; series 2) with the area measuring approximately 7.6 x 3.2 cm, previously 10.6 x 4.0 cm. Vascular/Lymphatic: Normal caliber abdominal aorta. Peripheral calcified noncalcified atherosclerotic plaque. No retroperitoneal lymphadenopathy. Reproductive: Status post hysterectomy. Other: None. Musculoskeletal: No aggressive or acute appearing osseous lesions. Lumbar  spine degenerative changes. No aggressive or acute appearing osseous lesions. Unchanged mild height loss of the L1 vertebral body. IMPRESSION: 1. Interval improvement in previously described mesenteric fat stranding and nodularity within the sigmoid mesocolon in along the anterior aspect of the right iliacus musculature. Improved fat stranding within the small bowel mesentery. 2. Similar-appearing nodularity within the right upper lobe near the apex. Electronically Signed   By: Lovey Newcomer M.D.   On: 07/31/2018 14:03   Ct Thoracic Spine Wo Contrast  Result Date: 08/24/2018 CLINICAL DATA:  Initial evaluation for acute mid back pain. No known injury. EXAM: CT THORACIC SPINE WITHOUT CONTRAST TECHNIQUE: Multidetector CT images of the thoracic were obtained using the standard protocol without intravenous contrast. COMPARISON:  Prior CT from 07/31/2018. FINDINGS: Alignment: S-shaped scoliosis of the thoracic spine noted. Alignment otherwise normal with preservation of the normal thoracic kyphosis. No listhesis or malalignment. Vertebrae: Acute compression deformity involving the superior endplate of T6. Associated height loss of up to 35% with trace 2 mm bony retropulsion. Acute compression fracture of the superior endplate of T7 with up to 35% height loss with trace 2 mm bony retropulsion. Acute compression fracture involving the T8 vertebral body with up to 45% height loss with trace 2 mm bony retropulsion. Fractures are benign in appearance by CT with no underlying lesion identified. Mild height loss in conchae cavity at the superior endplate of T5 is chronic in appearance. Vertebral body heights otherwise maintained with no other acute or chronic fracture. No discrete lytic or blastic osseous lesions. Paraspinal and other soft tissues: Paraspinous soft tissues demonstrate no acute finding. Dependent atelectatic changes noted within the visualized lungs, right greater than left. Irregular pleuroparenchymal  scarring noted at the right lung apex. Underlying emphysema. Disc levels: No significant stenosis seen within the thoracic spine. Mild facet hypertrophy within the lower thoracic spine, most notable at T9-10 and T10-11. IMPRESSION: 1. Acute compression deformity involving the superior endplate of T5 and T6 with up to 35% height loss with trace 2 mm bony retropulsion. 2. Additional acute compression fracture involving the T8 vertebral body with up to 45% height loss with trace 2 mm bony retropulsion. 3. Mild chronic height loss/compression deformity at the superior endplate of T5 without retropulsion. 4. Emphysema (ICD10-J43.9). Electronically Signed   By: Jeannine Boga M.D.   On: 08/24/2018 18:43   Ct Lumbar Spine Wo Contrast  Result Date: 08/24/2018 CLINICAL DATA:  Initial evaluation for acute mid back pain. History of prior compression fracture. EXAM: CT LUMBAR SPINE WITHOUT CONTRAST TECHNIQUE: Multidetector CT imaging of the lumbar spine was performed without intravenous contrast administration. Multiplanar CT image reconstructions were also generated. COMPARISON:  Prior CT from 07/31/2018 FINDINGS: Segmentation: Standard. Lowest well-formed disc labeled the L5-S1 level. Alignment: Mild dextroscoliosis. Alignment otherwise normal with preservation of the normal lumbar lordosis. No listhesis or subluxation. Vertebrae: Chronic compression deformity involving the superior endplate of L2 with up to approximately 40% height loss and trace 2 mm bony retropulsion, stable. Vertebral body heights otherwise maintained with no evidence for acute fracture. Visualized sacrum and pelvis intact. SI joints approximated and symmetric. No discrete lytic or blastic osseous lesions.  Paraspinal and other soft tissues: Paraspinous soft tissues demonstrate no acute finding. Partially visualized visceral structures grossly unremarkable. Moderate aorto bi-iliac atherosclerotic disease. Disc levels: L1-2: Trace 2 mm bony  retropulsion related to the chronic L2 compression fracture. Mild facet hypertrophy. No significant stenosis. L2-3:  Mild annular disc bulge.  No stenosis. L3-4: Mild diffuse disc bulge. Mild bilateral facet hypertrophy. No significant stenosis. L4-5: Mild diffuse disc bulge. Mild bilateral facet and ligamentum flavum hypertrophy. No significant spinal stenosis. Mild bilateral L4 foraminal narrowing. L5-S1: Mild diffuse disc bulge, asymmetric to the left. Moderate facet and ligament flavum hypertrophy. No spinal stenosis. Mild to moderate bilateral L5 foraminal narrowing. IMPRESSION: 1. No acute abnormality within the lumbar spine. 2. Chronic L2 compression deformity, stable from previous. 3. Mild to moderate bilateral L4 and L5 foraminal narrowing due to disc bulge and facet hypertrophy. Electronically Signed   By: Jeannine Boga M.D.   On: 08/24/2018 18:52   Ct Abdomen Pelvis W Contrast  Result Date: 07/31/2018 CLINICAL DATA:  Patient with history of small cell lung cancer. EXAM: CT CHEST, ABDOMEN, AND PELVIS WITH CONTRAST TECHNIQUE: Multidetector CT imaging of the chest, abdomen and pelvis was performed following the standard protocol during bolus administration of intravenous contrast. CONTRAST:  123mL ISOVUE-300 IOPAMIDOL (ISOVUE-300) INJECTION 61% COMPARISON:  CT CAP 06/09/2018 FINDINGS: CT CHEST FINDINGS Cardiovascular: Right anterior chest wall Port-A-Cath is present with tip terminating in the superior vena cava. Normal heart size. Trace fluid superior pericardial recess. Thoracic aortic vascular calcifications. Mediastinum/Nodes: No enlarged axillary, mediastinal or hilar lymphadenopathy. Small hiatal hernia. Lungs/Pleura: Central airways are patent. Dependent atelectasis/scarring within the bilateral lower lobes. Similar-appearing nodule within the right upper lobe measuring 1.9 x 1.4 cm (image 24; series 4). Similar-appearing surrounding ground-glass attenuation and architectural distortion  compatible with post radiation changes. No pleural effusion or pneumothorax. Musculoskeletal: No aggressive or acute appearing osseous lesions. CT ABDOMEN PELVIS FINDINGS Hepatobiliary: Liver is normal in size and contour. Similar-appearing low-attenuation adjacent to the falciform ligament, potentially fatty deposition or perfusion anomaly. Gallbladder is unremarkable. No intrahepatic or extrahepatic biliary ductal dilatation. Pancreas: Unremarkable Spleen: Unremarkable Adrenals/Urinary Tract: Normal adrenal glands. Kidneys enhance symmetrically with contrast. No hydronephrosis. Urinary bladder is unremarkable. Stomach/Bowel: Slight interval decrease in pericolonic mesenteric fat stranding (image 99; series 2). Interval decrease in fat stranding anterior to the right iliacus musculature (image 84; series 2). There is interval decrease in small bowel mesenteric fat stranding/haziness (image 77; series 2) with the area measuring approximately 7.6 x 3.2 cm, previously 10.6 x 4.0 cm. Vascular/Lymphatic: Normal caliber abdominal aorta. Peripheral calcified noncalcified atherosclerotic plaque. No retroperitoneal lymphadenopathy. Reproductive: Status post hysterectomy. Other: None. Musculoskeletal: No aggressive or acute appearing osseous lesions. Lumbar spine degenerative changes. No aggressive or acute appearing osseous lesions. Unchanged mild height loss of the L1 vertebral body. IMPRESSION: 1. Interval improvement in previously described mesenteric fat stranding and nodularity within the sigmoid mesocolon in along the anterior aspect of the right iliacus musculature. Improved fat stranding within the small bowel mesentery. 2. Similar-appearing nodularity within the right upper lobe near the apex. Electronically Signed   By: Lovey Newcomer M.D.   On: 07/31/2018 14:03   Dg Chest Port 1 View  Result Date: 08/24/2018 CLINICAL DATA:  Back pain EXAM: PORTABLE CHEST 1 VIEW COMPARISON:  07/31/2018 FINDINGS: Scarring is  again noted in the right apex stable from the prior CT examination. Right chest wall port is noted in satisfactory position. Cardiac shadow is unremarkable. Bibasilar atelectasis is seen. No bony abnormality is  noted. IMPRESSION: Bibasilar atelectasis. Chronic changes in the right apex. Electronically Signed   By: Inez Catalina M.D.   On: 08/24/2018 23:11     CBC Recent Labs  Lab 08/20/18 0824 08/24/18 1900 08/25/18 0448  WBC 17.0* 22.4* 13.3*  HGB 7.0* 11.7* 9.9*  HCT 21.2* 35.9* 30.8*  PLT 39* 144* 126*  MCV 94.2 94.7 94.5  MCH 31.1 30.9 30.4  MCHC 33.0 32.6 32.1  RDW 14.9 17.2* 17.1*  LYMPHSABS 2.0  --   --   MONOABS 2.0*  --   --   EOSABS 0.0  --   --   BASOSABS 0.1  --   --     Chemistries  Recent Labs  Lab 08/20/18 0824 08/24/18 1900 08/25/18 0448 08/25/18 1600  NA 139 136 136  --   K 3.1* 3.0* 3.0* 3.0*  CL 100 100 102  --   CO2 23 22 23   --   GLUCOSE 110* 148* 123*  --   BUN 4* 7 6  --   CREATININE 0.74 0.74 0.67  --   CALCIUM 6.4* 5.4* 5.2* 6.9*  MG  --  0.9*  --  2.6*  AST 14* 19  --   --   ALT 21 17  --   --   ALKPHOS 205* 163*  --   --   BILITOT 0.3 0.9  --   --   ------------------------------------------------------------------------------------------------------------------ No results found for: BNP   Roxan Hockey M.D on 08/25/2018 at 5:47 PM  Pager---[phone] Go to [email or web address] - password [email or web address] for contact info  [facility] - Office  [phone]

## 2018-08-26 LAB — CBC
HCT: 27 % — ABNORMAL LOW (ref 36.0–46.0)
Hemoglobin: 8.6 g/dL — ABNORMAL LOW (ref 12.0–15.0)
MCH: 30.8 pg (ref 26.0–34.0)
MCHC: 31.9 g/dL (ref 30.0–36.0)
MCV: 96.8 fL (ref 80.0–100.0)
Platelets: 131 10*3/uL — ABNORMAL LOW (ref 150–400)
RBC: 2.79 MIL/uL — ABNORMAL LOW (ref 3.87–5.11)
RDW: 17.5 % — ABNORMAL HIGH (ref 11.5–15.5)
WBC: 13.1 10*3/uL — ABNORMAL HIGH (ref 4.0–10.5)
nRBC: 0 % (ref 0.0–0.2)

## 2018-08-26 LAB — COMPREHENSIVE METABOLIC PANEL
ALT: 19 U/L (ref 0–44)
AST: 24 U/L (ref 15–41)
Albumin: 3.1 g/dL — ABNORMAL LOW (ref 3.5–5.0)
Alkaline Phosphatase: 129 U/L — ABNORMAL HIGH (ref 38–126)
Anion gap: 9 (ref 5–15)
BUN: <5 mg/dL — ABNORMAL LOW (ref 6–20)
CO2: 25 mmol/L (ref 22–32)
Calcium: 6.9 mg/dL — ABNORMAL LOW (ref 8.9–10.3)
Chloride: 101 mmol/L (ref 98–111)
Creatinine, Ser: 0.66 mg/dL (ref 0.44–1.00)
GFR calc Af Amer: >60 mL/min (ref >60)
GFR calc non Af Amer: >60 mL/min (ref >60)
Glucose, Bld: 122 mg/dL — ABNORMAL HIGH (ref 70–99)
Potassium: 3.4 mmol/L — ABNORMAL LOW (ref 3.5–5.1)
Sodium: 135 mmol/L (ref 135–145)
TOTAL PROTEIN: 6 g/dL — AB (ref 6.5–8.1)
Total Bilirubin: 1.2 mg/dL (ref 0.3–1.2)

## 2018-08-26 LAB — MAGNESIUM: MAGNESIUM: 2.2 mg/dL (ref 1.7–2.4)

## 2018-08-26 LAB — PARATHYROID HORMONE, INTACT (NO CA): PTH: 77 pg/mL — AB (ref 15–65)

## 2018-08-26 LAB — VITAMIN D 25 HYDROXY (VIT D DEFICIENCY, FRACTURES): Vit D, 25-Hydroxy: 5.6 ng/mL — ABNORMAL LOW (ref 30.0–100.0)

## 2018-08-26 MED ORDER — VITAMIN D (ERGOCALCIFEROL) 1.25 MG (50000 UNIT) PO CAPS
50000.0000 [IU] | ORAL_CAPSULE | ORAL | Status: DC
  Administered 2018-08-26: 50000 [IU] via ORAL
  Filled 2018-08-26: qty 1

## 2018-08-26 MED ORDER — CALCIUM GLUCONATE-NACL 1-0.675 GM/50ML-% IV SOLN
1.0000 g | Freq: Once | INTRAVENOUS | Status: AC
  Administered 2018-08-26: 1000 mg via INTRAVENOUS
  Filled 2018-08-26: qty 50

## 2018-08-26 MED ORDER — POTASSIUM CHLORIDE CRYS ER 20 MEQ PO TBCR
40.0000 meq | EXTENDED_RELEASE_TABLET | Freq: Once | ORAL | Status: AC
  Administered 2018-08-26: 40 meq via ORAL
  Filled 2018-08-26: qty 2

## 2018-08-26 NOTE — Progress Notes (Signed)
Patient Demographics:    Debra Kidd, is a 57 y.o. female, DOB - August 06, 1961, GYI:[id number]  Admit date - 08/24/2018   Admitting Physician Ejiroghene Arlyce Dice, MD  Outpatient Primary MD for the patient is [name], MD  LOS - 1   Chief Complaint  Patient presents with  . cancer patient  . 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 female with medical history significant for small cell lung cancer, depression, hypertension, admitted on 08/24/2018 with sudden onset back pain and found to have T5, T6 and T8 acute compression fractures as well as hypocalcemia, hypomagnesemia and hypokalemia.  Patient has no vomiting no diarrhea or significant GI losses.   Plan:- 1)Severe Hypocalcemia Vit D deficiency  albumin is 3.9,  initially calcium was 5.4,  repeat 5.2 despite replacement with iv calcium, in retrospect severe hypocalcemia is secondary to severe hypomagnesemia, the severe hypomagnesemia itself is induced by cisplatin therapy..... Keep serum magnesium above 2 and then continue to replace calcium IV and orally... PTH high Vit D low after replacement of magnesium and additional calcium replacement calcium is now up to 6.9, if calcium drops again please check magnesium  2)Hypomagnesemia and hypokalemia-----again hypomagnesemia secondary to cisplatin therapy, the hypomagnesemia is perpetuating patient's hypokalemia so we will continue to keep magnesium above 2 in order  to avoid persistent hypokalemia... After replacement serum magnesium is up  3)Acute  thoracic compression fractures with intractable back pain --- patient with acute compression fractures at T5, T6 and T8, EDP discussed case with on-call neurosurgeon on admission,  patient may need a back brace, PT OT eval appreciated, methocarbamol and PRN oxycodone and IV morphine as needed for pain  4) small cell lung cancer--- I called and discussed this case with Dr. Earlie Server, he will aggressively try to replace magnesium with each outpatient treatment cycle of cisplatin.   5) leukocytosis--- white count is down to 13.3 from 22.4 chest less than 24 hours ago, suspect reactive leukocytosis, patient without fevers or any definite focus of infection at this time, continue to monitor  Disposition/Need for in-Hospital Stay- patient unable to be discharged at this time due to severe electrolyte abnormalities including severe hypocalcemia and severe hypomagnesemia and severe hypokalemia with risk for significant arrhythmias and sudden death if not replaced and monitored carefully on the telemetry monitored unit  Code Status : Full  Family Communication:   None at bedside   Disposition Plan  : Possibly discharge home with home health services  Consults  : none  DVT Prophylaxis  :  Lovenox    Lab Results  Component Value Date   PLT 131 (L) 08/26/2018    Inpatient Medications  Scheduled Meds: . calcium-vitamin D  2 tablet Oral TID  . enoxaparin (LOVENOX) injection  40  mg Subcutaneous QHS  . methocarbamol  500 mg Oral QID  . polyethylene glycol  17 g Oral Daily  . senna-docusate  2 tablet Oral QHS  . Vitamin D (Ergocalciferol)  50,000 Units Oral Q7 days   Continuous Infusions: PRN Meds:.acetaminophen **OR** acetaminophen, morphine injection, ondansetron **OR** ondansetron (ZOFRAN) IV, oxyCODONE   Anti-infectives (From admission, onward)   None        Objective:   Vitals:   08/25/18 1401  08/25/18 2236 08/26/18 0524 08/26/18 1408  BP: 121/89 110/84 107/79 112/79  Pulse: (!) 117 94 (!) 102 (!) 102  Resp: 16 17 17 16   Temp: 98.6 F (37 C) 98.3 F (36.8 C) 98.7 F (37.1 C) 98.5 F (36.9 C)  TempSrc: Oral Oral Oral Oral  SpO2: 94% 95% 93% 95%  Weight:      Height:        Wt Readings from Last 3 Encounters:  08/24/18 63.5 kg  08/03/18 64.3 kg  07/27/18 65.1 kg    Intake/Output Summary (Last 24 hours) at 08/26/2018 1531 Last data filed at 08/26/2018 1330 Gross per 24 hour  Intake 995 ml  Output -  Net 995 ml     Physical Exam Patient is examined daily including today on 08/25/18 , exams remain the same as of yesterday except that has changed   Gen:- Awake Alert,  In no apparent distress  HEENT:- [place].AT, No sclera icterus Neck-Supple Neck,No JVD,.  Lungs-  CTAB , fair symmetrical air movemen CV- S1, S2 normal, regular  Abd-  +ve B.Sounds, Abd Soft, No tenderness,    Extremity/Skin:- No  edema, pedal pulses present  Psych-affect is appropriate, oriented x3 Neuro-generalized weakness but no new focal deficits, no tremors MSK- point tenderness on palpation over the mid and lower thoracic spine area, overlying skin without erythema or swelling   Data Review:   Micro Results No results found for this or any previous visit (from the past 240 hour(s)).  Radiology Reports Ct Chest W Contrast  Result Date: 07/31/2018 CLINICAL DATA:  Patient with history of small cell lung cancer. EXAM: CT CHEST, ABDOMEN, AND PELVIS WITH CONTRAST TECHNIQUE: Multidetector CT imaging of the chest, abdomen and pelvis was performed following the standard protocol during bolus administration of intravenous contrast. CONTRAST:  183mL ISOVUE-300 IOPAMIDOL (ISOVUE-300) INJECTION 61% COMPARISON:  CT CAP 06/09/2018 FINDINGS: CT CHEST FINDINGS Cardiovascular: Right anterior chest wall Port-A-Cath is present with tip terminating in the superior vena cava. Normal heart size. Trace fluid superior  pericardial recess. Thoracic aortic vascular calcifications. Mediastinum/Nodes: No enlarged axillary, mediastinal or hilar lymphadenopathy. Small hiatal hernia. Lungs/Pleura: Central airways are patent. Dependent atelectasis/scarring within the bilateral lower lobes. Similar-appearing nodule within the right upper lobe measuring 1.9 x 1.4 cm (image 24; series 4). Similar-appearing surrounding ground-glass attenuation and architectural distortion compatible with post radiation changes. No pleural effusion or pneumothorax. Musculoskeletal: No aggressive or acute appearing osseous lesions. CT ABDOMEN PELVIS FINDINGS Hepatobiliary: Liver is normal in size and contour. Similar-appearing low-attenuation adjacent to the falciform ligament, potentially fatty deposition or perfusion anomaly. Gallbladder is unremarkable. No intrahepatic or extrahepatic biliary ductal dilatation. Pancreas: Unremarkable Spleen: Unremarkable Adrenals/Urinary Tract: Normal adrenal glands. Kidneys enhance symmetrically with contrast. No hydronephrosis. Urinary bladder is unremarkable. Stomach/Bowel: Slight interval decrease in pericolonic mesenteric fat stranding (image 99; series 2). Interval decrease in fat stranding anterior to the right iliacus musculature (image 84; series 2). There is interval decrease in small bowel mesenteric fat stranding/haziness (image 77; series 2) with the area measuring approximately  7.6 x 3.2 cm, previously 10.6 x 4.0 cm. Vascular/Lymphatic: Normal caliber abdominal aorta. Peripheral calcified noncalcified atherosclerotic plaque. No retroperitoneal lymphadenopathy. Reproductive: Status post hysterectomy. Other: None. Musculoskeletal: No aggressive or acute appearing osseous lesions. Lumbar spine degenerative changes. No aggressive or acute appearing osseous lesions. Unchanged mild height loss of the L1 vertebral body. IMPRESSION: 1. Interval improvement in previously described mesenteric fat stranding and nodularity  within the sigmoid mesocolon in along the anterior aspect of the right iliacus musculature. Improved fat stranding within the small bowel mesentery. 2. Similar-appearing nodularity within the right upper lobe near the apex. Electronically Signed   By: Lovey Newcomer M.D.   On: 07/31/2018 14:03   Ct Thoracic Spine Wo Contrast  Result Date: 08/24/2018 CLINICAL DATA:  Initial evaluation for acute mid back pain. No known injury. EXAM: CT THORACIC SPINE WITHOUT CONTRAST TECHNIQUE: Multidetector CT images of the thoracic were obtained using the standard protocol without intravenous contrast. COMPARISON:  Prior CT from 07/31/2018. FINDINGS: Alignment: S-shaped scoliosis of the thoracic spine noted. Alignment otherwise normal with preservation of the normal thoracic kyphosis. No listhesis or malalignment. Vertebrae: Acute compression deformity involving the superior endplate of T6. Associated height loss of up to 35% with trace 2 mm bony retropulsion. Acute compression fracture of the superior endplate of T7 with up to 35% height loss with trace 2 mm bony retropulsion. Acute compression fracture involving the T8 vertebral body with up to 45% height loss with trace 2 mm bony retropulsion. Fractures are benign in appearance by CT with no underlying lesion identified. Mild height loss in conchae cavity at the superior endplate of T5 is chronic in appearance. Vertebral body heights otherwise maintained with no other acute or chronic fracture. No discrete lytic or blastic osseous lesions. Paraspinal and other soft tissues: Paraspinous soft tissues demonstrate no acute finding. Dependent atelectatic changes noted within the visualized lungs, right greater than left. Irregular pleuroparenchymal scarring noted at the right lung apex. Underlying emphysema. Disc levels: No significant stenosis seen within the thoracic spine. Mild facet hypertrophy within the lower thoracic spine, most notable at T9-10 and T10-11. IMPRESSION: 1.  Acute compression deformity involving the superior endplate of T5 and T6 with up to 35% height loss with trace 2 mm bony retropulsion. 2. Additional acute compression fracture involving the T8 vertebral body with up to 45% height loss with trace 2 mm bony retropulsion. 3. Mild chronic height loss/compression deformity at the superior endplate of T5 without retropulsion. 4. Emphysema (ICD10-J43.9). Electronically Signed   By: Jeannine Boga M.D.   On: 08/24/2018 18:43   Ct Lumbar Spine Wo Contrast  Result Date: 08/24/2018 CLINICAL DATA:  Initial evaluation for acute mid back pain. History of prior compression fracture. EXAM: CT LUMBAR SPINE WITHOUT CONTRAST TECHNIQUE: Multidetector CT imaging of the lumbar spine was performed without intravenous contrast administration. Multiplanar CT image reconstructions were also generated. COMPARISON:  Prior CT from 07/31/2018 FINDINGS: Segmentation: Standard. Lowest well-formed disc labeled the L5-S1 level. Alignment: Mild dextroscoliosis. Alignment otherwise normal with preservation of the normal lumbar lordosis. No listhesis or subluxation. Vertebrae: Chronic compression deformity involving the superior endplate of L2 with up to approximately 40% height loss and trace 2 mm bony retropulsion, stable. Vertebral body heights otherwise maintained with no evidence for acute fracture. Visualized sacrum and pelvis intact. SI joints approximated and symmetric. No discrete lytic or blastic osseous lesions. Paraspinal and other soft tissues: Paraspinous soft tissues demonstrate no acute finding. Partially visualized visceral structures grossly unremarkable. Moderate aorto bi-iliac atherosclerotic  disease. Disc levels: L1-2: Trace 2 mm bony retropulsion related to the chronic L2 compression fracture. Mild facet hypertrophy. No significant stenosis. L2-3:  Mild annular disc bulge.  No stenosis. L3-4: Mild diffuse disc bulge. Mild bilateral facet hypertrophy. No significant  stenosis. L4-5: Mild diffuse disc bulge. Mild bilateral facet and ligamentum flavum hypertrophy. No significant spinal stenosis. Mild bilateral L4 foraminal narrowing. L5-S1: Mild diffuse disc bulge, asymmetric to the left. Moderate facet and ligament flavum hypertrophy. No spinal stenosis. Mild to moderate bilateral L5 foraminal narrowing. IMPRESSION: 1. No acute abnormality within the lumbar spine. 2. Chronic L2 compression deformity, stable from previous. 3. Mild to moderate bilateral L4 and L5 foraminal narrowing due to disc bulge and facet hypertrophy. Electronically Signed   By: Jeannine Boga M.D.   On: 08/24/2018 18:52   Ct Abdomen Pelvis W Contrast  Result Date: 07/31/2018 CLINICAL DATA:  Patient with history of small cell lung cancer. EXAM: CT CHEST, ABDOMEN, AND PELVIS WITH CONTRAST TECHNIQUE: Multidetector CT imaging of the chest, abdomen and pelvis was performed following the standard protocol during bolus administration of intravenous contrast. CONTRAST:  160mL ISOVUE-300 IOPAMIDOL (ISOVUE-300) INJECTION 61% COMPARISON:  CT CAP 06/09/2018 FINDINGS: CT CHEST FINDINGS Cardiovascular: Right anterior chest wall Port-A-Cath is present with tip terminating in the superior vena cava. Normal heart size. Trace fluid superior pericardial recess. Thoracic aortic vascular calcifications. Mediastinum/Nodes: No enlarged axillary, mediastinal or hilar lymphadenopathy. Small hiatal hernia. Lungs/Pleura: Central airways are patent. Dependent atelectasis/scarring within the bilateral lower lobes. Similar-appearing nodule within the right upper lobe measuring 1.9 x 1.4 cm (image 24; series 4). Similar-appearing surrounding ground-glass attenuation and architectural distortion compatible with post radiation changes. No pleural effusion or pneumothorax. Musculoskeletal: No aggressive or acute appearing osseous lesions. CT ABDOMEN PELVIS FINDINGS Hepatobiliary: Liver is normal in size and contour.  Similar-appearing low-attenuation adjacent to the falciform ligament, potentially fatty deposition or perfusion anomaly. Gallbladder is unremarkable. No intrahepatic or extrahepatic biliary ductal dilatation. Pancreas: Unremarkable Spleen: Unremarkable Adrenals/Urinary Tract: Normal adrenal glands. Kidneys enhance symmetrically with contrast. No hydronephrosis. Urinary bladder is unremarkable. Stomach/Bowel: Slight interval decrease in pericolonic mesenteric fat stranding (image 99; series 2). Interval decrease in fat stranding anterior to the right iliacus musculature (image 84; series 2). There is interval decrease in small bowel mesenteric fat stranding/haziness (image 77; series 2) with the area measuring approximately 7.6 x 3.2 cm, previously 10.6 x 4.0 cm. Vascular/Lymphatic: Normal caliber abdominal aorta. Peripheral calcified noncalcified atherosclerotic plaque. No retroperitoneal lymphadenopathy. Reproductive: Status post hysterectomy. Other: None. Musculoskeletal: No aggressive or acute appearing osseous lesions. Lumbar spine degenerative changes. No aggressive or acute appearing osseous lesions. Unchanged mild height loss of the L1 vertebral body. IMPRESSION: 1. Interval improvement in previously described mesenteric fat stranding and nodularity within the sigmoid mesocolon in along the anterior aspect of the right iliacus musculature. Improved fat stranding within the small bowel mesentery. 2. Similar-appearing nodularity within the right upper lobe near the apex. Electronically Signed   By: Lovey Newcomer M.D.   On: 07/31/2018 14:03   Dg Chest Port 1 View  Result Date: 08/24/2018 CLINICAL DATA:  Back pain EXAM: PORTABLE CHEST 1 VIEW COMPARISON:  07/31/2018 FINDINGS: Scarring is again noted in the right apex stable from the prior CT examination. Right chest wall port is noted in satisfactory position. Cardiac shadow is unremarkable. Bibasilar atelectasis is seen. No bony abnormality is noted.  IMPRESSION: Bibasilar atelectasis. Chronic changes in the right apex. Electronically Signed   By: Inez Catalina M.D.   On:  08/24/2018 23:11     CBC Recent Labs  Lab 08/20/18 0824 08/24/18 1900 08/25/18 0448 08/26/18 0508  WBC 17.0* 22.4* 13.3* 13.1*  HGB 7.0* 11.7* 9.9* 8.6*  HCT 21.2* 35.9* 30.8* 27.0*  PLT 39* 144* 126* 131*  MCV 94.2 94.7 94.5 96.8  MCH 31.1 30.9 30.4 30.8  MCHC 33.0 32.6 32.1 31.9  RDW 14.9 17.2* 17.1* 17.5*  LYMPHSABS 2.0  --   --   --   MONOABS 2.0*  --   --   --   EOSABS 0.0  --   --   --   BASOSABS 0.1  --   --   --     Chemistries  Recent Labs  Lab 08/20/18 0824 08/24/18 1900 08/25/18 0448 08/25/18 1600 08/26/18 0508  NA 139 136 136  --  135  K 3.1* 3.0* 3.0* 3.0* 3.4*  CL 100 100 102  --  101  CO2 23 22 23   --  25  GLUCOSE 110* 148* 123*  --  122*  BUN 4* 7 6  --  <5*  CREATININE 0.74 0.74 0.67  --  0.66  CALCIUM 6.4* 5.4* 5.2* 6.9* 6.9*  MG  --  0.9*  --  2.6* 2.2  AST 14* 19  --   --  24  ALT 21 17  --   --  19  ALKPHOS 205* 163*  --   --  129*  BILITOT 0.3 0.9  --   --  1.2  ------------------------------------------------------------------------------------------------------------------ No results found for: BNP   Berle Mull M.D on 08/26/2018 at 3:31 PM  Go to [email or web address] - password [email or web address] for contact info  [facility] - Office  [phone]

## 2018-08-27 ENCOUNTER — Inpatient Hospital Stay: Payer: Medicare Other

## 2018-08-27 LAB — CBC
HCT: 27.7 % — ABNORMAL LOW (ref 36.0–46.0)
Hemoglobin: 8.9 g/dL — ABNORMAL LOW (ref 12.0–15.0)
MCH: 30.6 pg (ref 26.0–34.0)
MCHC: 32.1 g/dL (ref 30.0–36.0)
MCV: 95.2 fL (ref 80.0–100.0)
Platelets: 149 10*3/uL — ABNORMAL LOW (ref 150–400)
RBC: 2.91 MIL/uL — ABNORMAL LOW (ref 3.87–5.11)
RDW: 17.4 % — ABNORMAL HIGH (ref 11.5–15.5)
WBC: 9 10*3/uL (ref 4.0–10.5)
nRBC: 0 % (ref 0.0–0.2)

## 2018-08-27 LAB — BASIC METABOLIC PANEL
ANION GAP: 10 (ref 5–15)
BUN: <5 mg/dL — ABNORMAL LOW (ref 6–20)
CHLORIDE: 101 mmol/L (ref 98–111)
CO2: 27 mmol/L (ref 22–32)
Calcium: 8.5 mg/dL — ABNORMAL LOW (ref 8.9–10.3)
Creatinine, Ser: 0.63 mg/dL (ref 0.44–1.00)
GFR calc Af Amer: >60 mL/min (ref >60)
GFR calc non Af Amer: >60 mL/min (ref >60)
Glucose, Bld: 123 mg/dL — ABNORMAL HIGH (ref 70–99)
Potassium: 3.2 mmol/L — ABNORMAL LOW (ref 3.5–5.1)
Sodium: 138 mmol/L (ref 135–145)

## 2018-08-27 MED ORDER — METHOCARBAMOL 500 MG PO TABS: 500.0000 mg | ORAL_TABLET | Freq: Three times a day (TID) | ORAL | 0 refills | Status: DC

## 2018-08-27 MED ORDER — VITAMIN D (ERGOCALCIFEROL) 1.25 MG (50000 UNIT) PO CAPS: 50000.0000 [IU] | ORAL_CAPSULE | ORAL | 0 refills | Status: DC

## 2018-08-27 MED ORDER — OXYCODONE-ACETAMINOPHEN 5-325 MG PO TABS: 1.0000 | ORAL_TABLET | ORAL | 0 refills | Status: DC | PRN

## 2018-08-27 MED ORDER — POTASSIUM CHLORIDE CRYS ER 20 MEQ PO TBCR
40.0000 meq | EXTENDED_RELEASE_TABLET | Freq: Every day | ORAL | Status: DC
  Administered 2018-08-27: 40 meq via ORAL
  Filled 2018-08-27: qty 2

## 2018-08-27 MED ORDER — SENNOSIDES-DOCUSATE SODIUM 8.6-50 MG PO TABS: 2.0000 | ORAL_TABLET | Freq: Every day | ORAL | 0 refills | Status: DC

## 2018-08-27 MED ORDER — HEPARIN SOD (PORK) LOCK FLUSH 100 UNIT/ML IV SOLN
500.0000 [IU] | INTRAVENOUS | Status: DC | PRN
  Filled 2018-08-27: qty 5

## 2018-08-27 MED ORDER — POLYETHYLENE GLYCOL 3350 17 G PO PACK: 17.0000 g | PACK | Freq: Every day | ORAL | 0 refills | Status: DC

## 2018-08-27 MED ORDER — CALCIUM CARBONATE 1250 (500 CA) MG PO TABS
1.0000 | ORAL_TABLET | Freq: Two times a day (BID) | ORAL | Status: DC
  Administered 2018-08-27: 500 mg via ORAL
  Filled 2018-08-27: qty 1

## 2018-08-27 MED FILL — OXYCODONE-ACETAMINOPHEN 5-3: 5-325 | 4 days supply | Qty: 20 | Fill #0

## 2018-08-27 MED FILL — VIT D2 1.25 MG (50,000 UNIT: 1.25 MG | 35 days supply | Qty: 5 | Fill #0

## 2018-08-27 MED FILL — METHOCARBAMOL 500 MG TABLET: 500 | 10 days supply | Qty: 30 | Fill #0

## 2018-08-27 MED FILL — SM CLEARLAX POWDER: 15 days supply | Qty: 238 | Fill #0

## 2018-08-27 NOTE — Progress Notes (Signed)
Pt discharged home with family in stable condition. DME delivered to room. Discharge instructions given. Pt verbalized understanding. Scripts sent to pharmacy of choice. No immediate questions or concerns at this time. Pt discharged from unit via wheelchair.

## 2018-08-27 NOTE — Progress Notes (Signed)
Physical Therapy Treatment Patient Details Name: Debra Kidd MRN: [id number] DOB: 10/26/1960 Today's Date: 08/27/2018    History of Present Illness 57 y.o. 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 Dx of compression fractures at T5, T6, T8.    PT Comments    Pt tolerated increased ambulation distance of 115' with RW, no loss of balance. Back pain 5/10 at rest, no change with ambulation.  Follow Up Recommendations  Home health PT     Equipment Recommendations  Rolling walker with 5" wheels;3in1 (PT)    Recommendations for Other Services       Precautions / Restrictions Precautions Precautions: Back Precaution Comments: compression fxs; denies h/o falls in past 1 year Restrictions Weight Bearing Restrictions: No    Mobility  Bed Mobility               General bed mobility comments: up in recliner  Transfers Overall transfer level: Needs assistance Equipment used: Rolling walker (2 wheeled) Transfers: Sit to/from Stand Sit to Stand: Supervision         General transfer comment: VCs hand placement  Ambulation/Gait Ambulation/Gait assistance: Supervision Gait Distance (Feet): 115 Feet Assistive device: Rolling walker (2 wheeled) Gait Pattern/deviations: Step-through pattern;Decreased stride length Gait velocity: decr   General Gait Details: steady with RW, VCs for positioning in RW, no increased pain with ambulation   Stairs             Wheelchair Mobility    Modified Rankin (Stroke Patients Only)       Balance Overall balance assessment: Modified Independent                                          Cognition Arousal/Alertness: Awake/alert Behavior During Therapy: WFL for tasks assessed/performed Overall Cognitive Status:  Within Functional Limits for tasks assessed                                        Exercises      General Comments        Pertinent Vitals/Pain Pain Score: 5  Pain Location: back  Pain Descriptors / Indicators: Sore Pain Intervention(s): Limited activity within patient's tolerance;Monitored during session;Premedicated before session    Home Living                      Prior Function            PT Goals (current goals can now be found in the care plan section) Acute Rehab PT Goals Patient Stated Goal: watch tv PT Goal Formulation: With patient Time For Goal Achievement: 09/08/18 Potential to Achieve Goals: Good Progress towards PT goals: Progressing toward goals    Frequency    Min 3X/week      PT Plan Current plan remains appropriate    Co-evaluation              AM-PAC PT "6 Clicks" Mobility   Outcome Measure  Help needed turning from your back to your side while in a flat bed without using bedrails?: A Little Help needed moving from lying on your back to sitting  on the side of a flat bed without using bedrails?: A Little Help needed moving to and from a bed to a chair (including a wheelchair)?: A Little Help needed standing up from a chair using your arms (e.g., wheelchair or bedside chair)?: A Little Help needed to walk in hospital room?: A Little Help needed climbing 3-5 steps with a railing? : A Lot 6 Click Score: 17    End of Session Equipment Utilized During Treatment: Gait belt Activity Tolerance: Patient tolerated treatment well Patient left: in chair;with call bell/phone within reach;with family/visitor present Nurse Communication: Mobility status PT Visit Diagnosis: Pain;Difficulty in walking, not elsewhere classified (R26.2)     Time: 7741-2878 PT Time Calculation (min) (ACUTE ONLY): 8 min  Charges:  $Gait Training: 8-22 mins           Blondell Reveal Kistler PT 08/27/2018  Acute Rehabilitation  Services Pager [phone] Office [phone]

## 2018-08-27 NOTE — Care Management Note (Signed)
Case Management Note  Patient Details  Name: BRANDICE BUSSER MRN: [id number] Date of Birth: 10/06/1960  Subjective/Objective:                  discharged  Action/Plan: [facility] for dme ans home P.T. per patient choice. List of providers placed on shadow chart.  Expected Discharge Date:  08/27/18               Expected Discharge Plan:  [place]  In-House Referral:     Discharge planning Services  CM Consult  Post Acute Care Choice:  Durable Medical Equipment, Home Health Choice offered to:  Patient  DME Arranged:  3-N-1, Walker rolling DME Agency:  [place]:  PT [place]:  [place]  Status of Service:  Completed, signed off  If discussed at [place] of Stay Meetings, dates discussed:    Additional Comments:  Leeroy Cha, RN 08/27/2018, 12:46 PM

## 2018-08-28 NOTE — Discharge Summary (Signed)
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 Please follow-up with PCP in 1 week. 2. Please follow-up with neurosurgery Dr. Saintclair Halsted in 2 weeks.  Follow-up Information    Little, James, MD. Schedule an appointment as soon as possible for a visit in 1 week(s).   Specialty:  Family Medicine Contact information: [address] [place] [address] [phone]          Diet recommendation: Regular diet  Activity: The patient is advised to gradually reintroduce usual activities.  Discharge Condition: good  Code Status: Full code  History of present illness: As per the H and P dictated on admission, "Debra Kidd is a 57 y.o. female with medical history significant for small cell lung cancer, depression, hypertension, presented to the ED with complaints of sudden onset back pain that started this morning when she turned in bed. Pain was so severe she could not get out of bed. She denies shooting pain down her lower extremities, denies weakness or heaviness of her extremities. She reports some numbness in bilateral lower extremities yesterday lasting a few hours that has since resolved.  Denies chronic back pains prior to today.  No Bowel or urinary problems.  Reports a dry cough that started yesterday, without difficulty breathing. No vomiting no loose stools, reports chronic poor p.o. intake.  She reports chills  without fevers.  ED Course: Tachycardic- 119, with tachypnea to 28.  Thoracic and lumbar CT shows acute compression deformity involving the superior endplate of T5 and T6 with up to 35% height loss, also involving T8 vertebra.  Calcium low- 5.4.  Leukocytosis to 22.4.  Improved hemoglobin and platelets.  EKG with nonspecific T wave abnormalities anterolaterally.  No old EKG to compare. EDP talked to neurosurgery on-call, no urgent intervention.  1 g Ca gluconate given hospitalist to admit for intractable back pain."  Hospital Course:  Summary of her active problems in the hospital is as following. )Severe Hypocalcemia Vit D deficiency  albumin is 3.9,  initially calcium was 5.4,  repeat 5.2 despite replacement with iv calcium, in retrospect severe hypocalcemia is secondary to severe hypomagnesemia, the severe hypomagnesemia itself is induced by cisplatin therapy..... Keep serum magnesium above 2 and then continue to replace calcium IV and orally... PTH high Vit D low replace. after replacement of magnesium and additional calcium replacement calcium is now up  2)Hypomagnesemia and hypokalemia-----again hypomagnesemia secondary to cisplatin therapy, the hypomagnesemia is perpetuating patient's hypokalemia so we will continue to keep magnesium above 2 in order to avoid persistent hypokalemia... After replacement serum magnesium is up  3)Acute  thoracic compression fractures with intractable back pain --- patient with acute compression fractures at T5, T6 and T8, EDP discussed case with on-call neurosurgeon on admission,  I discussed with Dr. Saintclair Halsted. Recommend outpatient follow-up. Recommend no indication for brace since the pain is well controlled with current medication.  4) small cell lung cancer--- Oncology was informed regarding possible  hypomagnesemia secondary to cisplatin.  I will monitor.  5) leukocytosis--- Reactive.   All other chronic medical condition were stable during the  hospitalization.  Patient was seen by physical therapy, who recommended home health, which was arranged by Education officer, museum and case Freight forwarder. On the day of the discharge the patient's vitals were stable , and no other acute medical condition were reported by patient. the patient was felt safe to be discharge at home with home health.  Consultants: none Procedures: none  DISCHARGE MEDICATION: Allergies as of 08/27/2018      Reactions   Codeine Nausea And Vomiting   Motrin [ibuprofen]    Elevation of BP   [name]    intolerant   Vicodin [hydrocodone-acetaminophen] Nausea And Vomiting      Medication List    STOP taking these medications   magic mouthwash Soln   potassium chloride SA 20 MEQ tablet Commonly known as:  K-DUR,KLOR-CON   prochlorperazine 10 MG tablet Commonly known as:  COMPAZINE     TAKE these medications   celecoxib 200 MG capsule Commonly known as:  CELEBREX Take 200 mg by mouth daily as needed for mild pain.   diazepam 5 MG tablet Commonly known as:  VALIUM Take 5 mg by mouth 4 (four) times daily as needed for anxiety.   diphenhydrAMINE 25 MG tablet Commonly known as:  BENADRYL Take 25 mg by mouth every 6 (six) hours as needed for itching or allergies.   lidocaine-prilocaine cream Commonly known as:  EMLA Apply 1 application topically as needed. What changed:    when to take this  reasons to take this   loperamide 2 MG tablet Commonly known as:  IMODIUM A-D Take 2 mg by mouth 4 (four) times daily as needed for diarrhea or loose stools.   methocarbamol 500 MG tablet Commonly known as:  ROBAXIN Take 1 tablet (500 mg total) by mouth 3 (three) times daily.   oxyCODONE-acetaminophen 5-325 MG tablet Commonly known as:  PERCOCET Take 1 tablet by mouth every 4 (four) hours as needed for up to 5 days for severe pain.   polyethylene glycol packet Commonly known as:  MIRALAX / GLYCOLAX Take 17 g by mouth daily.   potassium chloride  20 MEQ/15ML (10%) Soln Take 40 mEq by mouth daily. What changed:  Another medication with the same name was removed. Continue taking this medication, and follow the directions you see here.   senna-docusate 8.6-50 MG tablet Commonly known as:  Senokot-S Take 2 tablets by mouth at bedtime.   traMADol 50 MG tablet Commonly known as:  ULTRAM Take 50 mg by mouth every 6 (six) hours as needed for moderate pain.   Vitamin D (Ergocalciferol) 1.25 MG (50000 UT) Caps capsule Commonly known as:  DRISDOL Take 1 capsule (50,000 Units total) by mouth every 7 (seven) days. Start taking on:  September 02, 2018      Allergies  Allergen Reactions  . Codeine Nausea And Vomiting  . Motrin [Ibuprofen]     Elevation of BP  . [name]     intolerant  . Vicodin [Hydrocodone-Acetaminophen] Nausea And Vomiting   Discharge Instructions    Diet - low sodium heart healthy   Complete by:  As directed    Discharge instructions   Complete by:  As directed    It is important that you read following instructions as well as go over your medication list with RN to help you understand your care after this hospitalization.  Discharge  Instructions: Please follow-up with PCP in one week  Please request your primary care physician to go over all Hospital Tests and Procedure/Radiological results at the follow up,  Please get all Hospital records sent to your PCP by signing hospital release before you go home.   Do not drive, operating heavy machinery, perform activities at heights, swimming or participation in water activities or provide baby sitting services while you are on Pain, Sleep and Anxiety Medications; until you have been seen by Primary Care Physician or a Neurologist and advised to do so again. Do not take more than prescribed Pain, Sleep and Anxiety Medications. You were cared for by a hospitalist during your hospital stay. If you have any questions about your discharge medications or the  care you received while you were in the hospital after you are discharged, you can call the unit you were admitted to and ask to speak with the hospitalist on call if the hospitalist that took care of you is not available.  Once you are discharged, your primary care physician will handle any further medical issues. Please note that NO REFILLS for any discharge medications will be authorized once you are discharged, as it is imperative that you return to your primary care physician (or establish a relationship with a primary care physician if you do not have one) for your aftercare needs so that they can reassess your need for medications and monitor your lab values. You Must read complete instructions/literature along with all the possible adverse reactions/side effects for all the Medicines you take and that have been prescribed to you. Take any new Medicines after you have completely understood and accept all the possible adverse reactions/side effects. Wear Seat belts while driving. If you have smoked or chewed Tobacco in the last 2 yrs please stop smoking and/or stop any Recreational drug use.   Increase activity slowly   Complete by:  As directed      Discharge Exam: Filed Weights   08/24/18 1705  Weight: 63.5 kg   Vitals:   08/27/18 0408 08/27/18 1323  BP: 126/85 106/87  Pulse: (!) 105 (!) 102  Resp: 16 16  Temp: 97.7 F (36.5 C) 98.2 F (36.8 C)  SpO2: 97% 99%   General: Appear in mild distress, no Rash; Oral Mucosa moist. Cardiovascular: S1 and S2 Present, no Murmur, no JVD Respiratory: Bilateral Air entry present and Clear to Auscultation, no Crackles, no wheezes Abdomen: Bowel Sound present, Soft and no tenderness Extremities: no Pedal edema, no calf tenderness Neurology: Grossly no focal neuro deficit.  The results of significant diagnostics from this hospitalization (including imaging, microbiology, ancillary and laboratory) are listed below for reference.    Significant  Diagnostic Studies: Ct Chest W Contrast  Result Date: 07/31/2018 CLINICAL DATA:  Patient with history of small cell lung cancer. EXAM: CT CHEST, ABDOMEN, AND PELVIS WITH CONTRAST TECHNIQUE: Multidetector CT imaging of the chest, abdomen and pelvis was performed following the standard protocol during bolus administration of intravenous contrast. CONTRAST:  115mL ISOVUE-300 IOPAMIDOL (ISOVUE-300) INJECTION 61% COMPARISON:  CT CAP 06/09/2018 FINDINGS: CT CHEST FINDINGS Cardiovascular: Right anterior chest wall Port-A-Cath is present with tip terminating in the superior vena cava. Normal heart size. Trace fluid superior pericardial recess. Thoracic aortic vascular calcifications. Mediastinum/Nodes: No enlarged axillary, mediastinal or hilar lymphadenopathy. Small hiatal hernia. Lungs/Pleura: Central airways are patent. Dependent atelectasis/scarring within the bilateral lower lobes. Similar-appearing nodule within the right upper lobe measuring 1.9 x 1.4 cm (image 24; series 4). Similar-appearing surrounding  ground-glass attenuation and architectural distortion compatible with post radiation changes. No pleural effusion or pneumothorax. Musculoskeletal: No aggressive or acute appearing osseous lesions. CT ABDOMEN PELVIS FINDINGS Hepatobiliary: Liver is normal in size and contour. Similar-appearing low-attenuation adjacent to the falciform ligament, potentially fatty deposition or perfusion anomaly. Gallbladder is unremarkable. No intrahepatic or extrahepatic biliary ductal dilatation. Pancreas: Unremarkable Spleen: Unremarkable Adrenals/Urinary Tract: Normal adrenal glands. Kidneys enhance symmetrically with contrast. No hydronephrosis. Urinary bladder is unremarkable. Stomach/Bowel: Slight interval decrease in pericolonic mesenteric fat stranding (image 99; series 2). Interval decrease in fat stranding anterior to the right iliacus musculature (image 84; series 2). There is interval decrease in small bowel  mesenteric fat stranding/haziness (image 77; series 2) with the area measuring approximately 7.6 x 3.2 cm, previously 10.6 x 4.0 cm. Vascular/Lymphatic: Normal caliber abdominal aorta. Peripheral calcified noncalcified atherosclerotic plaque. No retroperitoneal lymphadenopathy. Reproductive: Status post hysterectomy. Other: None. Musculoskeletal: No aggressive or acute appearing osseous lesions. Lumbar spine degenerative changes. No aggressive or acute appearing osseous lesions. Unchanged mild height loss of the L1 vertebral body. IMPRESSION: 1. Interval improvement in previously described mesenteric fat stranding and nodularity within the sigmoid mesocolon in along the anterior aspect of the right iliacus musculature. Improved fat stranding within the small bowel mesentery. 2. Similar-appearing nodularity within the right upper lobe near the apex. Electronically Signed   By: Lovey Newcomer M.D.   On: 07/31/2018 14:03   Ct Thoracic Spine Wo Contrast  Result Date: 08/24/2018 CLINICAL DATA:  Initial evaluation for acute mid back pain. No known injury. EXAM: CT THORACIC SPINE WITHOUT CONTRAST TECHNIQUE: Multidetector CT images of the thoracic were obtained using the standard protocol without intravenous contrast. COMPARISON:  Prior CT from 07/31/2018. FINDINGS: Alignment: S-shaped scoliosis of the thoracic spine noted. Alignment otherwise normal with preservation of the normal thoracic kyphosis. No listhesis or malalignment. Vertebrae: Acute compression deformity involving the superior endplate of T6. Associated height loss of up to 35% with trace 2 mm bony retropulsion. Acute compression fracture of the superior endplate of T7 with up to 35% height loss with trace 2 mm bony retropulsion. Acute compression fracture involving the T8 vertebral body with up to 45% height loss with trace 2 mm bony retropulsion. Fractures are benign in appearance by CT with no underlying lesion identified. Mild height loss in conchae  cavity at the superior endplate of T5 is chronic in appearance. Vertebral body heights otherwise maintained with no other acute or chronic fracture. No discrete lytic or blastic osseous lesions. Paraspinal and other soft tissues: Paraspinous soft tissues demonstrate no acute finding. Dependent atelectatic changes noted within the visualized lungs, right greater than left. Irregular pleuroparenchymal scarring noted at the right lung apex. Underlying emphysema. Disc levels: No significant stenosis seen within the thoracic spine. Mild facet hypertrophy within the lower thoracic spine, most notable at T9-10 and T10-11. IMPRESSION: 1. Acute compression deformity involving the superior endplate of T5 and T6 with up to 35% height loss with trace 2 mm bony retropulsion. 2. Additional acute compression fracture involving the T8 vertebral body with up to 45% height loss with trace 2 mm bony retropulsion. 3. Mild chronic height loss/compression deformity at the superior endplate of T5 without retropulsion. 4. Emphysema (ICD10-J43.9). Electronically Signed   By: Jeannine Boga M.D.   On: 08/24/2018 18:43   Ct Lumbar Spine Wo Contrast  Result Date: 08/24/2018 CLINICAL DATA:  Initial evaluation for acute mid back pain. History of prior compression fracture. EXAM: CT LUMBAR SPINE WITHOUT CONTRAST TECHNIQUE: Multidetector CT  imaging of the lumbar spine was performed without intravenous contrast administration. Multiplanar CT image reconstructions were also generated. COMPARISON:  Prior CT from 07/31/2018 FINDINGS: Segmentation: Standard. Lowest well-formed disc labeled the L5-S1 level. Alignment: Mild dextroscoliosis. Alignment otherwise normal with preservation of the normal lumbar lordosis. No listhesis or subluxation. Vertebrae: Chronic compression deformity involving the superior endplate of L2 with up to approximately 40% height loss and trace 2 mm bony retropulsion, stable. Vertebral body heights otherwise  maintained with no evidence for acute fracture. Visualized sacrum and pelvis intact. SI joints approximated and symmetric. No discrete lytic or blastic osseous lesions. Paraspinal and other soft tissues: Paraspinous soft tissues demonstrate no acute finding. Partially visualized visceral structures grossly unremarkable. Moderate aorto bi-iliac atherosclerotic disease. Disc levels: L1-2: Trace 2 mm bony retropulsion related to the chronic L2 compression fracture. Mild facet hypertrophy. No significant stenosis. L2-3:  Mild annular disc bulge.  No stenosis. L3-4: Mild diffuse disc bulge. Mild bilateral facet hypertrophy. No significant stenosis. L4-5: Mild diffuse disc bulge. Mild bilateral facet and ligamentum flavum hypertrophy. No significant spinal stenosis. Mild bilateral L4 foraminal narrowing. L5-S1: Mild diffuse disc bulge, asymmetric to the left. Moderate facet and ligament flavum hypertrophy. No spinal stenosis. Mild to moderate bilateral L5 foraminal narrowing. IMPRESSION: 1. No acute abnormality within the lumbar spine. 2. Chronic L2 compression deformity, stable from previous. 3. Mild to moderate bilateral L4 and L5 foraminal narrowing due to disc bulge and facet hypertrophy. Electronically Signed   By: Jeannine Boga M.D.   On: 08/24/2018 18:52   Ct Abdomen Pelvis W Contrast  Result Date: 07/31/2018 CLINICAL DATA:  Patient with history of small cell lung cancer. EXAM: CT CHEST, ABDOMEN, AND PELVIS WITH CONTRAST TECHNIQUE: Multidetector CT imaging of the chest, abdomen and pelvis was performed following the standard protocol during bolus administration of intravenous contrast. CONTRAST:  163mL ISOVUE-300 IOPAMIDOL (ISOVUE-300) INJECTION 61% COMPARISON:  CT CAP 06/09/2018 FINDINGS: CT CHEST FINDINGS Cardiovascular: Right anterior chest wall Port-A-Cath is present with tip terminating in the superior vena cava. Normal heart size. Trace fluid superior pericardial recess. Thoracic aortic vascular  calcifications. Mediastinum/Nodes: No enlarged axillary, mediastinal or hilar lymphadenopathy. Small hiatal hernia. Lungs/Pleura: Central airways are patent. Dependent atelectasis/scarring within the bilateral lower lobes. Similar-appearing nodule within the right upper lobe measuring 1.9 x 1.4 cm (image 24; series 4). Similar-appearing surrounding ground-glass attenuation and architectural distortion compatible with post radiation changes. No pleural effusion or pneumothorax. Musculoskeletal: No aggressive or acute appearing osseous lesions. CT ABDOMEN PELVIS FINDINGS Hepatobiliary: Liver is normal in size and contour. Similar-appearing low-attenuation adjacent to the falciform ligament, potentially fatty deposition or perfusion anomaly. Gallbladder is unremarkable. No intrahepatic or extrahepatic biliary ductal dilatation. Pancreas: Unremarkable Spleen: Unremarkable Adrenals/Urinary Tract: Normal adrenal glands. Kidneys enhance symmetrically with contrast. No hydronephrosis. Urinary bladder is unremarkable. Stomach/Bowel: Slight interval decrease in pericolonic mesenteric fat stranding (image 99; series 2). Interval decrease in fat stranding anterior to the right iliacus musculature (image 84; series 2). There is interval decrease in small bowel mesenteric fat stranding/haziness (image 77; series 2) with the area measuring approximately 7.6 x 3.2 cm, previously 10.6 x 4.0 cm. Vascular/Lymphatic: Normal caliber abdominal aorta. Peripheral calcified noncalcified atherosclerotic plaque. No retroperitoneal lymphadenopathy. Reproductive: Status post hysterectomy. Other: None. Musculoskeletal: No aggressive or acute appearing osseous lesions. Lumbar spine degenerative changes. No aggressive or acute appearing osseous lesions. Unchanged mild height loss of the L1 vertebral body. IMPRESSION: 1. Interval improvement in previously described mesenteric fat stranding and nodularity within the sigmoid mesocolon in  along the  anterior aspect of the right iliacus musculature. Improved fat stranding within the small bowel mesentery. 2. Similar-appearing nodularity within the right upper lobe near the apex. Electronically Signed   By: Lovey Newcomer M.D.   On: 07/31/2018 14:03   Dg Chest Port 1 View  Result Date: 08/24/2018 CLINICAL DATA:  Back pain EXAM: PORTABLE CHEST 1 VIEW COMPARISON:  07/31/2018 FINDINGS: Scarring is again noted in the right apex stable from the prior CT examination. Right chest wall port is noted in satisfactory position. Cardiac shadow is unremarkable. Bibasilar atelectasis is seen. No bony abnormality is noted. IMPRESSION: Bibasilar atelectasis. Chronic changes in the right apex. Electronically Signed   By: Inez Catalina M.D.   On: 08/24/2018 23:11    Microbiology: No results found for this or any previous visit (from the past 240 hour(s)).   Labs: CBC: Recent Labs  Lab 08/24/18 1900 08/25/18 0448 08/26/18 0508 08/27/18 0526  WBC 22.4* 13.3* 13.1* 9.0  HGB 11.7* 9.9* 8.6* 8.9*  HCT 35.9* 30.8* 27.0* 27.7*  MCV 94.7 94.5 96.8 95.2  PLT 144* 126* 131* 347*   Basic Metabolic Panel: Recent Labs  Lab 08/24/18 1900 08/25/18 0448 08/25/18 1600 08/26/18 0508 08/27/18 0526  NA 136 136  --  135 138  K 3.0* 3.0* 3.0* 3.4* 3.2*  CL 100 102  --  101 101  CO2 22 23  --  25 27  GLUCOSE 148* 123*  --  122* 123*  BUN 7 6  --  <5* <5*  CREATININE 0.74 0.67  --  0.66 0.63  CALCIUM 5.4* 5.2* 6.9* 6.9* 8.5*  MG 0.9*  --  2.6* 2.2  --   PHOS  --  3.1  --   --   --    Liver Function Tests: Recent Labs  Lab 08/24/18 1900 08/26/18 0508  AST 19 24  ALT 17 19  ALKPHOS 163* 129*  BILITOT 0.9 1.2  PROT 7.1 6.0*  ALBUMIN 3.9 3.1*   No results for input(s): LIPASE, AMYLASE in the last 168 hours. No results for input(s): AMMONIA in the last 168 hours. Cardiac Enzymes: No results for input(s): CKTOTAL, CKMB, CKMBINDEX, TROPONINI in the last 168 hours. BNP (last 3 results) No results for  input(s): BNP in the last 8760 hours. CBG: No results for input(s): GLUCAP in the last 168 hours. Time spent: 35 minutes  Signed:  Berle Mull  [facility] 08/27/2018 , 5:32 PM

## 2018-08-29 ENCOUNTER — Inpatient Hospital Stay: Payer: Medicare Other

## 2018-08-31 ENCOUNTER — Telehealth: Payer: Self-pay | Admitting: *Deleted

## 2018-08-31 MED ORDER — OXYCODONE-ACETAMINOPHEN 5-325 MG PO TABS: 1.0000 | ORAL_TABLET | Freq: Four times a day (QID) | ORAL | 0 refills | Status: DC | PRN

## 2018-08-31 NOTE — Telephone Encounter (Signed)
Pt called request for pain meds. Recent 12/23 lab/MD/infusion missed by pt  As " her back went out". Pt proceeded to be admitted on 12/23, d/c 12/26 with instructions to f/u with Dr. Saintclair Halsted (neuro) for back pain and PCP. Message to scheduling to r/s [facility] appts. Reviewed with MD pain med refill, ok to refill oxycodone 5/325 1 tablet PO q 6hours q# 30

## 2018-08-31 NOTE — Telephone Encounter (Signed)
Notified pt Rx ready for pick up tomorrow. Pt verbalized understanding.

## 2018-09-01 ENCOUNTER — Telehealth: Payer: Self-pay | Admitting: *Deleted

## 2018-09-01 ENCOUNTER — Telehealth: Payer: Self-pay | Admitting: Internal Medicine

## 2018-09-01 MED FILL — POTASSIUM CL 10% (20 MEQ/15: 20 MEQ/15ML | 30 days supply | Qty: 900 | Fill #1

## 2018-09-01 MED FILL — OXYCODONE-ACETAMINOPHEN 5-3: 5-325 | 7 days supply | Qty: 30 | Fill #0

## 2018-09-01 NOTE — Telephone Encounter (Signed)
Scheduled appt per 12/30 sch message - pt is aware of appt date and time   

## 2018-09-01 NOTE — Telephone Encounter (Signed)
Spouse here for prescription pick-up asked about next F/U visit with provider.  Declined nurse check at this time.  "Discharged five days ago after compression fracture.  Has pain so she rests on couch or in bed.  Able to walk approximately twenty-four feet from bed to bathroom.  Elevated commode seat helps.  We'll wait for scheduling call.  Perhaps vanity prevents her using a walker.  So far she's walking okay."    Pending message to reschedule the 08-24-2018 appointment missed due to hospitalization.  Scheduling will call patient.

## 2018-09-09 ENCOUNTER — Other Ambulatory Visit: Payer: Self-pay | Admitting: Internal Medicine

## 2018-09-10 ENCOUNTER — Telehealth: Payer: Self-pay | Admitting: Internal Medicine

## 2018-09-10 NOTE — Telephone Encounter (Signed)
Scheduled appt per 1/8 sch message - pt is aware of treatment added for next week

## 2018-09-11 ENCOUNTER — Inpatient Hospital Stay: Payer: Medicare Other

## 2018-09-11 ENCOUNTER — Encounter: Payer: Self-pay | Admitting: Internal Medicine

## 2018-09-11 ENCOUNTER — Inpatient Hospital Stay: Payer: Medicare Other | Attending: Internal Medicine

## 2018-09-11 ENCOUNTER — Inpatient Hospital Stay (HOSPITAL_BASED_OUTPATIENT_CLINIC_OR_DEPARTMENT_OTHER): Payer: Medicare Other | Admitting: Internal Medicine

## 2018-09-11 VITALS — BP 139/98 | HR 110 | Temp 97.9°F | Resp 18 | Ht 63.0 in | Wt 133.7 lb

## 2018-09-11 DIAGNOSIS — Z85828 Personal history of other malignant neoplasm of skin: Secondary | ICD-10-CM | POA: Diagnosis not present

## 2018-09-11 DIAGNOSIS — R5382 Chronic fatigue, unspecified: Secondary | ICD-10-CM

## 2018-09-11 DIAGNOSIS — M810 Age-related osteoporosis without current pathological fracture: Secondary | ICD-10-CM

## 2018-09-11 DIAGNOSIS — Z7689 Persons encountering health services in other specified circumstances: Secondary | ICD-10-CM | POA: Diagnosis not present

## 2018-09-11 DIAGNOSIS — J439 Emphysema, unspecified: Secondary | ICD-10-CM | POA: Diagnosis not present

## 2018-09-11 DIAGNOSIS — C3491 Malignant neoplasm of unspecified part of right bronchus or lung: Secondary | ICD-10-CM

## 2018-09-11 DIAGNOSIS — Z5111 Encounter for antineoplastic chemotherapy: Secondary | ICD-10-CM | POA: Insufficient documentation

## 2018-09-11 DIAGNOSIS — I10 Essential (primary) hypertension: Secondary | ICD-10-CM | POA: Diagnosis not present

## 2018-09-11 DIAGNOSIS — C3411 Malignant neoplasm of upper lobe, right bronchus or lung: Secondary | ICD-10-CM | POA: Insufficient documentation

## 2018-09-11 DIAGNOSIS — E785 Hyperlipidemia, unspecified: Secondary | ICD-10-CM | POA: Insufficient documentation

## 2018-09-11 DIAGNOSIS — Z9221 Personal history of antineoplastic chemotherapy: Secondary | ICD-10-CM | POA: Insufficient documentation

## 2018-09-11 DIAGNOSIS — C7889 Secondary malignant neoplasm of other digestive organs: Secondary | ICD-10-CM | POA: Diagnosis not present

## 2018-09-11 DIAGNOSIS — D6481 Anemia due to antineoplastic chemotherapy: Secondary | ICD-10-CM

## 2018-09-11 DIAGNOSIS — Z79899 Other long term (current) drug therapy: Secondary | ICD-10-CM

## 2018-09-11 DIAGNOSIS — E876 Hypokalemia: Secondary | ICD-10-CM

## 2018-09-11 DIAGNOSIS — M549 Dorsalgia, unspecified: Secondary | ICD-10-CM | POA: Insufficient documentation

## 2018-09-11 DIAGNOSIS — Z923 Personal history of irradiation: Secondary | ICD-10-CM | POA: Insufficient documentation

## 2018-09-11 DIAGNOSIS — T451X5A Adverse effect of antineoplastic and immunosuppressive drugs, initial encounter: Secondary | ICD-10-CM

## 2018-09-11 LAB — CMP (CANCER CENTER ONLY)
ALK PHOS: 229 U/L — AB (ref 38–126)
ALT: 16 U/L (ref 0–44)
AST: 20 U/L (ref 15–41)
Albumin: 3.4 g/dL — ABNORMAL LOW (ref 3.5–5.0)
Anion gap: 12 (ref 5–15)
BUN: 5 mg/dL — AB (ref 6–20)
CALCIUM: 9.1 mg/dL (ref 8.9–10.3)
CO2: 24 mmol/L (ref 22–32)
Chloride: 102 mmol/L (ref 98–111)
Creatinine: 0.78 mg/dL (ref 0.44–1.00)
GFR, Est AFR Am: >60 mL/min (ref >60)
GFR, Estimated: >60 mL/min (ref >60)
Glucose, Bld: 108 mg/dL — ABNORMAL HIGH (ref 70–99)
Potassium: 3.4 mmol/L — ABNORMAL LOW (ref 3.5–5.1)
Sodium: 138 mmol/L (ref 135–145)
Total Bilirubin: 0.3 mg/dL (ref 0.3–1.2)
Total Protein: 7.5 g/dL (ref 6.5–8.1)

## 2018-09-11 LAB — CBC WITH DIFFERENTIAL (CANCER CENTER ONLY)
Abs Immature Granulocytes: 0.04 10*3/uL (ref 0.00–0.07)
Basophils Absolute: 0.1 10*3/uL (ref 0.0–0.1)
Basophils Relative: 1 %
EOS ABS: 0.1 10*3/uL (ref 0.0–0.5)
Eosinophils Relative: 2 %
HCT: 33.1 % — ABNORMAL LOW (ref 36.0–46.0)
Hemoglobin: 10.9 g/dL — ABNORMAL LOW (ref 12.0–15.0)
Immature Granulocytes: 1 %
Lymphocytes Relative: 17 %
Lymphs Abs: 1.4 10*3/uL (ref 0.7–4.0)
MCH: 32.5 pg (ref 26.0–34.0)
MCHC: 32.9 g/dL (ref 30.0–36.0)
MCV: 98.8 fL (ref 80.0–100.0)
Monocytes Absolute: 0.5 10*3/uL (ref 0.1–1.0)
Monocytes Relative: 6 %
Neutro Abs: 6.2 10*3/uL (ref 1.7–7.7)
Neutrophils Relative %: 73 %
Platelet Count: 218 10*3/uL (ref 150–400)
RBC: 3.35 MIL/uL — ABNORMAL LOW (ref 3.87–5.11)
RDW: 21.2 % — ABNORMAL HIGH (ref 11.5–15.5)
WBC Count: 8.3 10*3/uL (ref 4.0–10.5)
nRBC: 0 % (ref 0.0–0.2)

## 2018-09-11 LAB — TSH: TSH: 1.24 u[IU]/mL (ref 0.308–3.960)

## 2018-09-11 MED ORDER — OXYCODONE-ACETAMINOPHEN 5-325 MG PO TABS: 1.0000 | ORAL_TABLET | Freq: Four times a day (QID) | ORAL | 0 refills | Status: DC | PRN

## 2018-09-11 MED ORDER — HEPARIN SOD (PORK) LOCK FLUSH 100 UNIT/ML IV SOLN
500.0000 [IU] | Freq: Once | INTRAVENOUS | Status: AC | PRN
  Administered 2018-09-11: 500 [IU]
  Filled 2018-09-11: qty 5

## 2018-09-11 MED ORDER — SODIUM CHLORIDE 0.9% FLUSH
10.0000 mL | INTRAVENOUS | Status: DC | PRN
  Administered 2018-09-11: 10 mL
  Filled 2018-09-11: qty 10

## 2018-09-11 MED FILL — OXYCODONE-ACETAMINOPHEN 5-3: 5-325 | 8 days supply | Qty: 30 | Fill #0

## 2018-09-11 NOTE — Progress Notes (Signed)
[place] [phone]   [phone]  OFFICE PROGRESS NOTE  [name], MD [address] [place] [address]  DIAGNOSIS: Extensive stage (T2a, N3, [name], large right anterior mediastinal and supraclavicular lymphadenopathy as well as pancreatic and splenic metastasis diagnosed in September 2017.  The patient had disease progression in October 2019.  PRIOR THERAPY:  1) Systemic chemotherapy was carboplatin for AUC of 5 on day 1 and etoposide 100 MG/M2 on days 1, 2 and 3 with Neulasta support. Status post 6 cycles with significant response of her disease. 2) Prophylactic cranial irradiation under the care of Dr. Sondra Come on 12/02/2016. 3) stereotactic radiotherapy to the recurrent right upper lobe pulmonary nodule under the care of Dr. Sondra Come completed November 10, 2017.  CURRENT THERAPY: Rate treatment with systemic chemotherapy with carboplatin for AUC of 5 on day 1 and etoposide 100 mg/M2 on days 1, 2 and 3 as well as Tecentriq (Atezolizumab) 1200 mg IV every 3 weeks with Neulasta support.  First dose June 22, 2018 for disease recurrence.  Status post 2 cycles.  Starting from cycle #2 her dose of carboplatin will be reduced to AUC of 4 and etoposide 80 mg/M2 on days 1, 2 and 3 in addition to the regular dose of Tecentriq.  INTERVAL HISTORY: Debra Kidd 58 y.o. female returns to the clinic today for follow-up visit accompanied by her husband.  The patient is feeling fine today with no concerning complaints.  She was recently admitted to [facility] with severe back pain and was found to have acute compression deformity involving the superior endplate of T5 and T6 with up to 35% height loss.  She also had additional acute compression fracture involving T8 vertebral body with up to 45% height loss.  The patient was a started on pain medication with Percocet and she is feeling fine.   She takes her pain medicine few times a day.  She is requesting refill of her medications.  She also had pancytopenia during her admission and received platelets and PRBCs transfusion.  She also was found to have significant hypokalemia, hypocalcemia and hypomagnesemia and she received electrolyte replacement.  She is feeling much better today.  She denied having any chest pain, shortness of breath, cough or hemoptysis.  She has no nausea, vomiting, diarrhea or constipation.  She is here today for evaluation before resuming her systemic chemotherapy.  MEDICAL HISTORY: Past Medical History:  Diagnosis Date  . 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 Blood type, Rh positive   . Cancer ([place])   . Chronic pain syndrome   . Depression 08/07/2016  . Encounter for antineoplastic chemotherapy 07/17/2016  . History of external beam radiation therapy 11/19/16-12/02/16   brain 25 Gy in 10 fractions  . Hyperlipidemia   . Hypertension   . Hypertension 08/07/2016  . Osteoporosis     ALLERGIES:  is allergic to codeine; motrin [ibuprofen]; thiazide-type diuretics; and vicodin [hydrocodone-acetaminophen].  MEDICATIONS:  Current Outpatient Medications  Medication Sig Dispense Refill  . celecoxib (CELEBREX) 200 MG capsule Take 200 mg by mouth daily as needed for mild pain.     . diazepam (VALIUM) 5 MG tablet Take 5 mg by mouth 4 (four) times daily as needed for anxiety.   2  . diphenhydrAMINE (BENADRYL) 25 MG tablet Take 25 mg by mouth every 6 (six) hours  as needed for itching or allergies.     Marland Kitchen lidocaine-prilocaine (EMLA) cream Apply 1 application topically as needed. (Patient taking differently: Apply 1 application topically daily as needed (access port). ) 30 g 0  . loperamide (IMODIUM A-D) 2 MG tablet Take 2 mg by mouth 4 (four) times daily as needed for diarrhea or loose stools.    . methocarbamol (ROBAXIN) 500 MG tablet Take 1 tablet (500 mg total) by mouth 3 (three) times daily. 30 tablet 0    . oxyCODONE-acetaminophen (PERCOCET) 5-325 MG tablet Take 1 tablet by mouth every 6 (six) hours as needed for severe pain. 30 tablet 0  . polyethylene glycol (MIRALAX / GLYCOLAX) packet Take 17 g by mouth daily. 14 each 0  . potassium chloride 20 MEQ/15ML (10%) SOLN Take 40 mEq by mouth daily.    Marland Kitchen senna-docusate (SENOKOT-S) 8.6-50 MG tablet Take 2 tablets by mouth at bedtime. 5 tablet 0  . traMADol (ULTRAM) 50 MG tablet Take 50 mg by mouth every 6 (six) hours as needed for moderate pain.     . Vitamin D, Ergocalciferol, (DRISDOL) 1.25 MG (50000 UT) CAPS capsule Take 1 capsule (50,000 Units total) by mouth every 7 (seven) days. 5 capsule 0   No current facility-administered medications for this visit.    Facility-Administered Medications Ordered in Other Visits  Medication Dose Route Frequency Provider Last Rate Last Dose  . sodium chloride flush (NS) 0.9 % injection 10 mL  10 mL Intracatheter PRN [name], MD   10 mL at 09/11/18 0847    SURGICAL HISTORY:  Past Surgical History:  Procedure Laterality Date  . ABDOMINAL HYSTERECTOMY  1981  . APPENDECTOMY     at age 5  . EYE SURGERY     left  . IR GENERIC HISTORICAL  06/03/2016   IR FLUORO GUIDE PORT INSERTION RIGHT 06/03/2016 [facility]  . IR GENERIC HISTORICAL  06/03/2016   IR US GUIDE VASC ACCESS RIGHT 06/03/2016 [facility]  . SKIN CANCER EXCISION     basal cell carcinoma L side of face    REVIEW OF SYSTEMS:  A comprehensive review of systems was negative except for: Constitutional: positive for fatigue Respiratory: positive for dyspnea on exertion Musculoskeletal: positive for back pain   PHYSICAL EXAMINATION: General appearance: alert, cooperative, fatigued and no distress Head: Normocephalic, without obvious abnormality, atraumatic Neck: no adenopathy, no JVD, supple, symmetrical, trachea midline and thyroid not enlarged, symmetric, no tenderness/mass/nodules Lymph nodes: Cervical, supraclavicular, and axillary  nodes normal. Resp: clear to auscultation bilaterally Back: symmetric, no curvature. ROM normal. No CVA tenderness. Cardio: regular rate and rhythm, S1, S2 normal, no murmur, click, rub or gallop GI: soft, non-tender; bowel sounds normal; no masses,  no organomegaly Extremities: extremities normal, atraumatic, no cyanosis or edema  ECOG PERFORMANCE STATUS: 1 - Symptomatic but completely ambulatory  Blood pressure (!) 139/98, pulse (!) 110, temperature 97.9 F (36.6 C), temperature source Oral, resp. rate 18, height 5\' 3"  (1.6 m), weight 133 lb 11.2 oz (60.6 kg), SpO2 100 %.  LABORATORY DATA: Lab Results  Component Value Date   WBC 8.3 09/11/2018   HGB 10.9 (L) 09/11/2018   HCT 33.1 (L) 09/11/2018   MCV 98.8 09/11/2018   PLT 218 09/11/2018      Chemistry      Component Value Date/Time   NA 138 08/27/2018 0526   NA 141 08/01/2017 0835   K 3.2 (L) 08/27/2018 0526   K 3.5 08/01/2017 0835   CL 101 08/27/2018  0526   CO2 27 08/27/2018 0526   CO2 25 08/01/2017 0835   BUN <5 (L) 08/27/2018 0526   BUN 8.0 08/01/2017 0835   CREATININE 0.63 08/27/2018 0526   CREATININE 0.74 08/20/2018 0824   CREATININE 0.9 08/01/2017 0835      Component Value Date/Time   CALCIUM 8.5 (L) 08/27/2018 0526   CALCIUM 9.9 08/01/2017 0835   ALKPHOS 129 (H) 08/26/2018 0508   ALKPHOS 142 08/01/2017 0835   AST 24 08/26/2018 0508   AST 14 (L) 08/20/2018 0824   AST 15 08/01/2017 0835   ALT 19 08/26/2018 0508   ALT 21 08/20/2018 0824   ALT 19 08/01/2017 0835   BILITOT 1.2 08/26/2018 0508   BILITOT 0.3 08/20/2018 0824   BILITOT 0.35 08/01/2017 0835       RADIOGRAPHIC STUDIES: Ct Thoracic Spine Wo Contrast  Result Date: 08/24/2018 CLINICAL DATA:  Initial evaluation for acute mid back pain. No known injury. EXAM: CT THORACIC SPINE WITHOUT CONTRAST TECHNIQUE: Multidetector CT images of the thoracic were obtained using the standard protocol without intravenous contrast. COMPARISON:  Prior CT from  07/31/2018. FINDINGS: Alignment: S-shaped scoliosis of the thoracic spine noted. Alignment otherwise normal with preservation of the normal thoracic kyphosis. No listhesis or malalignment. Vertebrae: Acute compression deformity involving the superior endplate of T6. Associated height loss of up to 35% with trace 2 mm bony retropulsion. Acute compression fracture of the superior endplate of T7 with up to 35% height loss with trace 2 mm bony retropulsion. Acute compression fracture involving the T8 vertebral body with up to 45% height loss with trace 2 mm bony retropulsion. Fractures are benign in appearance by CT with no underlying lesion identified. Mild height loss in conchae cavity at the superior endplate of T5 is chronic in appearance. Vertebral body heights otherwise maintained with no other acute or chronic fracture. No discrete lytic or blastic osseous lesions. Paraspinal and other soft tissues: Paraspinous soft tissues demonstrate no acute finding. Dependent atelectatic changes noted within the visualized lungs, right greater than left. Irregular pleuroparenchymal scarring noted at the right lung apex. Underlying emphysema. Disc levels: No significant stenosis seen within the thoracic spine. Mild facet hypertrophy within the lower thoracic spine, most notable at T9-10 and T10-11. IMPRESSION: 1. Acute compression deformity involving the superior endplate of T5 and T6 with up to 35% height loss with trace 2 mm bony retropulsion. 2. Additional acute compression fracture involving the T8 vertebral body with up to 45% height loss with trace 2 mm bony retropulsion. 3. Mild chronic height loss/compression deformity at the superior endplate of T5 without retropulsion. 4. Emphysema (ICD10-J43.9). Electronically Signed   By: Jeannine Boga M.D.   On: 08/24/2018 18:43   Ct Lumbar Spine Wo Contrast  Result Date: 08/24/2018 CLINICAL DATA:  Initial evaluation for acute mid back pain. History of prior  compression fracture. EXAM: CT LUMBAR SPINE WITHOUT CONTRAST TECHNIQUE: Multidetector CT imaging of the lumbar spine was performed without intravenous contrast administration. Multiplanar CT image reconstructions were also generated. COMPARISON:  Prior CT from 07/31/2018 FINDINGS: Segmentation: Standard. Lowest well-formed disc labeled the L5-S1 level. Alignment: Mild dextroscoliosis. Alignment otherwise normal with preservation of the normal lumbar lordosis. No listhesis or subluxation. Vertebrae: Chronic compression deformity involving the superior endplate of L2 with up to approximately 40% height loss and trace 2 mm bony retropulsion, stable. Vertebral body heights otherwise maintained with no evidence for acute fracture. Visualized sacrum and pelvis intact. SI joints approximated and symmetric. No discrete lytic or blastic  osseous lesions. Paraspinal and other soft tissues: Paraspinous soft tissues demonstrate no acute finding. Partially visualized visceral structures grossly unremarkable. Moderate aorto bi-iliac atherosclerotic disease. Disc levels: L1-2: Trace 2 mm bony retropulsion related to the chronic L2 compression fracture. Mild facet hypertrophy. No significant stenosis. L2-3:  Mild annular disc bulge.  No stenosis. L3-4: Mild diffuse disc bulge. Mild bilateral facet hypertrophy. No significant stenosis. L4-5: Mild diffuse disc bulge. Mild bilateral facet and ligamentum flavum hypertrophy. No significant spinal stenosis. Mild bilateral L4 foraminal narrowing. L5-S1: Mild diffuse disc bulge, asymmetric to the left. Moderate facet and ligament flavum hypertrophy. No spinal stenosis. Mild to moderate bilateral L5 foraminal narrowing. IMPRESSION: 1. No acute abnormality within the lumbar spine. 2. Chronic L2 compression deformity, stable from previous. 3. Mild to moderate bilateral L4 and L5 foraminal narrowing due to disc bulge and facet hypertrophy. Electronically Signed   By: Jeannine Boga M.D.    On: 08/24/2018 18:52   Dg Chest Port 1 View  Result Date: 08/24/2018 CLINICAL DATA:  Back pain EXAM: PORTABLE CHEST 1 VIEW COMPARISON:  07/31/2018 FINDINGS: Scarring is again noted in the right apex stable from the prior CT examination. Right chest wall port is noted in satisfactory position. Cardiac shadow is unremarkable. Bibasilar atelectasis is seen. No bony abnormality is noted. IMPRESSION: Bibasilar atelectasis. Chronic changes in the right apex. Electronically Signed   By: Inez Catalina M.D.   On: 08/24/2018 23:11    ASSESSMENT AND PLAN:  This is a very pleasant 58 years old white female with extensive stage small cell lung cancer status post systemic chemotherapy with carbo platinum and etoposide for 6 cycles and the patient rotated her treatment well except for chemotherapy-induced anemia and requirement for PRBCs and platelet transfusion. She had significant improvement in her disease with the chemotherapy. She also had prophylactic cranial irradiation. She also underwent stereotactic radiotherapy to progressive right upper lobe pulmonary nodule. The patient has been in observation for close to 2 years. Repeat CT scan of the chest, abdomen and pelvis performed recently showed evidence for disease progression in the abdomen. The patient was started on systemic chemotherapy again with carboplatin, etoposide and Tecentriq status post 3 cycles. The patient has been tolerating this treatment well except for significant pancytopenia and electrolyte abnormalities which significantly improved. I recommended for her to proceed with cycle #4 as a schedule early next week. I will see her back for follow-up visit in 3 weeks for evaluation after repeating CT scan of the chest, abdomen and pelvis for restaging of her disease. For pain management I gave her a refill of Percocet today. For hypertension she was advised to resume her blood pressure medications. The patient was advised to call immediately  if she has any concerning symptoms in the interval. The patient voices understanding of current disease status and treatment options and is in agreement with the current care plan. All questions were answered. The patient knows to call the clinic with any problems, questions or concerns. We can certainly see the patient much sooner if necessary.  Disclaimer: This note was dictated with voice recognition software. Similar sounding words can inadvertently be transcribed and may not be corrected upon review.

## 2018-09-14 ENCOUNTER — Telehealth: Payer: Self-pay | Admitting: Internal Medicine

## 2018-09-14 ENCOUNTER — Inpatient Hospital Stay: Payer: Medicare Other

## 2018-09-14 VITALS — BP 127/95 | HR 92 | Temp 98.7°F | Resp 18

## 2018-09-14 DIAGNOSIS — C3491 Malignant neoplasm of unspecified part of right bronchus or lung: Secondary | ICD-10-CM

## 2018-09-14 DIAGNOSIS — Z5111 Encounter for antineoplastic chemotherapy: Secondary | ICD-10-CM | POA: Diagnosis not present

## 2018-09-14 MED ORDER — SODIUM CHLORIDE 0.9 % IV SOLN
380.0000 mg | Freq: Once | INTRAVENOUS | Status: AC
  Administered 2018-09-14: 380 mg via INTRAVENOUS
  Filled 2018-09-14: qty 38

## 2018-09-14 MED ORDER — DIPHENHYDRAMINE HCL 50 MG/ML IJ SOLN
INTRAMUSCULAR | Status: AC
  Filled 2018-09-14: qty 1

## 2018-09-14 MED ORDER — PALONOSETRON HCL INJECTION 0.25 MG/5ML
INTRAVENOUS | Status: AC
  Filled 2018-09-14: qty 5

## 2018-09-14 MED ORDER — SODIUM CHLORIDE 0.9 % IV SOLN
80.0000 mg/m2 | Freq: Once | INTRAVENOUS | Status: AC
  Administered 2018-09-14: 140 mg via INTRAVENOUS
  Filled 2018-09-14: qty 7

## 2018-09-14 MED ORDER — DIPHENHYDRAMINE HCL 50 MG/ML IJ SOLN
25.0000 mg | Freq: Once | INTRAMUSCULAR | Status: AC
  Administered 2018-09-14: 25 mg via INTRAVENOUS

## 2018-09-14 MED ORDER — HEPARIN SOD (PORK) LOCK FLUSH 100 UNIT/ML IV SOLN
500.0000 [IU] | Freq: Once | INTRAVENOUS | Status: AC | PRN
  Administered 2018-09-14: 500 [IU]
  Filled 2018-09-14: qty 5

## 2018-09-14 MED ORDER — FAMOTIDINE IN NACL 20-0.9 MG/50ML-% IV SOLN
20.0000 mg | Freq: Once | INTRAVENOUS | Status: AC
  Administered 2018-09-14: 20 mg via INTRAVENOUS

## 2018-09-14 MED ORDER — SODIUM CHLORIDE 0.9 % IV SOLN
1200.0000 mg | Freq: Once | INTRAVENOUS | Status: AC
  Administered 2018-09-14: 1200 mg via INTRAVENOUS
  Filled 2018-09-14: qty 20

## 2018-09-14 MED ORDER — SODIUM CHLORIDE 0.9 % IV SOLN
Freq: Once | INTRAVENOUS | Status: AC
  Administered 2018-09-14: 08:00:00 via INTRAVENOUS
  Filled 2018-09-14: qty 250

## 2018-09-14 MED ORDER — FAMOTIDINE IN NACL 20-0.9 MG/50ML-% IV SOLN
INTRAVENOUS | Status: AC
  Filled 2018-09-14: qty 50

## 2018-09-14 MED ORDER — SODIUM CHLORIDE 0.9 % IV SOLN
Freq: Once | INTRAVENOUS | Status: AC
  Administered 2018-09-14: 08:00:00 via INTRAVENOUS
  Filled 2018-09-14: qty 5

## 2018-09-14 MED ORDER — FAMOTIDINE IN NACL 20-0.9 MG/50ML-% IV SOLN: 20.0000 mg | Freq: Once | INTRAVENOUS | Status: DC

## 2018-09-14 MED ORDER — SODIUM CHLORIDE 0.9% FLUSH
10.0000 mL | INTRAVENOUS | Status: DC | PRN
  Administered 2018-09-14: 10 mL
  Filled 2018-09-14: qty 10

## 2018-09-14 MED ORDER — PALONOSETRON HCL INJECTION 0.25 MG/5ML
0.2500 mg | Freq: Once | INTRAVENOUS | Status: AC
  Administered 2018-09-14: 0.25 mg via INTRAVENOUS

## 2018-09-14 NOTE — Telephone Encounter (Signed)
Scheduled appt per 1/10 los - pt to get an updated schedule in the treatment area.

## 2018-09-14 NOTE — Patient Instructions (Signed)
[facility] Discharge Instructions for Patients Receiving Chemotherapy  Today you received the following chemotherapy agents:  Tecentriq, Etoposide, and Carboplatin.  To help prevent nausea and vomiting after your treatment, we encourage you to take your nausea medication as directed.   If you develop nausea and vomiting that is not controlled by your nausea medication, call the clinic.   BELOW ARE SYMPTOMS THAT SHOULD BE REPORTED IMMEDIATELY:  *FEVER GREATER THAN 100.5 F  *CHILLS WITH OR WITHOUT FEVER  NAUSEA AND VOMITING THAT IS NOT CONTROLLED WITH YOUR NAUSEA MEDICATION  *UNUSUAL SHORTNESS OF BREATH  *UNUSUAL BRUISING OR BLEEDING  TENDERNESS IN MOUTH AND THROAT WITH OR WITHOUT PRESENCE OF ULCERS  *URINARY PROBLEMS  *BOWEL PROBLEMS  UNUSUAL RASH Items with * indicate a potential emergency and should be followed up as soon as possible.  Feel free to call the clinic should you have any questions or concerns. The clinic phone number is [phone].  Please show the [facility] at check-in to the Emergency Department and triage nurse.

## 2018-09-15 ENCOUNTER — Inpatient Hospital Stay: Payer: Medicare Other

## 2018-09-15 VITALS — BP 125/76 | HR 88 | Temp 98.4°F | Resp 16

## 2018-09-15 DIAGNOSIS — Z5111 Encounter for antineoplastic chemotherapy: Secondary | ICD-10-CM | POA: Diagnosis not present

## 2018-09-15 DIAGNOSIS — C3491 Malignant neoplasm of unspecified part of right bronchus or lung: Secondary | ICD-10-CM

## 2018-09-15 MED ORDER — FAMOTIDINE IN NACL 20-0.9 MG/50ML-% IV SOLN
20.0000 mg | Freq: Once | INTRAVENOUS | Status: AC
  Administered 2018-09-15: 20 mg via INTRAVENOUS

## 2018-09-15 MED ORDER — SODIUM CHLORIDE 0.9 % IV SOLN
80.0000 mg/m2 | Freq: Once | INTRAVENOUS | Status: AC
  Administered 2018-09-15: 140 mg via INTRAVENOUS
  Filled 2018-09-15: qty 7

## 2018-09-15 MED ORDER — SODIUM CHLORIDE 0.9 % IV SOLN
Freq: Once | INTRAVENOUS | Status: AC
  Administered 2018-09-15: 08:00:00 via INTRAVENOUS
  Filled 2018-09-15: qty 250

## 2018-09-15 MED ORDER — FAMOTIDINE IN NACL 20-0.9 MG/50ML-% IV SOLN
INTRAVENOUS | Status: AC
  Filled 2018-09-15: qty 50

## 2018-09-15 MED ORDER — DEXAMETHASONE SODIUM PHOSPHATE 10 MG/ML IJ SOLN
INTRAMUSCULAR | Status: AC
  Filled 2018-09-15: qty 1

## 2018-09-15 MED ORDER — SODIUM CHLORIDE 0.9% FLUSH
10.0000 mL | INTRAVENOUS | Status: DC | PRN
  Administered 2018-09-15: 10 mL
  Filled 2018-09-15: qty 10

## 2018-09-15 MED ORDER — DEXAMETHASONE SODIUM PHOSPHATE 10 MG/ML IJ SOLN
10.0000 mg | Freq: Once | INTRAMUSCULAR | Status: AC
  Administered 2018-09-15: 10 mg via INTRAVENOUS

## 2018-09-15 MED ORDER — HEPARIN SOD (PORK) LOCK FLUSH 100 UNIT/ML IV SOLN
500.0000 [IU] | Freq: Once | INTRAVENOUS | Status: AC | PRN
  Administered 2018-09-15: 500 [IU]
  Filled 2018-09-15: qty 5

## 2018-09-15 NOTE — Patient Instructions (Signed)
[facility] Discharge Instructions for Patients Receiving Chemotherapy  Today you received the following chemotherapy agents: Etoposide.  To help prevent nausea and vomiting after your treatment, we encourage you to take your nausea medication as directed.   If you develop nausea and vomiting that is not controlled by your nausea medication, call the clinic.   BELOW ARE SYMPTOMS THAT SHOULD BE REPORTED IMMEDIATELY:  *FEVER GREATER THAN 100.5 F  *CHILLS WITH OR WITHOUT FEVER  NAUSEA AND VOMITING THAT IS NOT CONTROLLED WITH YOUR NAUSEA MEDICATION  *UNUSUAL SHORTNESS OF BREATH  *UNUSUAL BRUISING OR BLEEDING  TENDERNESS IN MOUTH AND THROAT WITH OR WITHOUT PRESENCE OF ULCERS  *URINARY PROBLEMS  *BOWEL PROBLEMS  UNUSUAL RASH Items with * indicate a potential emergency and should be followed up as soon as possible.  Feel free to call the clinic should you have any questions or concerns. The clinic phone number is [phone].  Please show the [facility] at check-in to the Emergency Department and triage nurse.

## 2018-09-16 ENCOUNTER — Inpatient Hospital Stay: Payer: Medicare Other

## 2018-09-16 VITALS — BP 151/75 | HR 81 | Temp 98.1°F | Resp 16

## 2018-09-16 DIAGNOSIS — C3491 Malignant neoplasm of unspecified part of right bronchus or lung: Secondary | ICD-10-CM

## 2018-09-16 DIAGNOSIS — Z5111 Encounter for antineoplastic chemotherapy: Secondary | ICD-10-CM | POA: Diagnosis not present

## 2018-09-16 MED ORDER — DEXAMETHASONE SODIUM PHOSPHATE 10 MG/ML IJ SOLN
INTRAMUSCULAR | Status: AC
  Filled 2018-09-16: qty 1

## 2018-09-16 MED ORDER — SODIUM CHLORIDE 0.9 % IV SOLN
80.0000 mg/m2 | Freq: Once | INTRAVENOUS | Status: AC
  Administered 2018-09-16: 140 mg via INTRAVENOUS
  Filled 2018-09-16: qty 7

## 2018-09-16 MED ORDER — HEPARIN SOD (PORK) LOCK FLUSH 100 UNIT/ML IV SOLN
500.0000 [IU] | Freq: Once | INTRAVENOUS | Status: AC | PRN
  Administered 2018-09-16: 500 [IU]
  Filled 2018-09-16: qty 5

## 2018-09-16 MED ORDER — FAMOTIDINE IN NACL 20-0.9 MG/50ML-% IV SOLN
20.0000 mg | Freq: Once | INTRAVENOUS | Status: AC
  Administered 2018-09-16: 20 mg via INTRAVENOUS

## 2018-09-16 MED ORDER — DEXAMETHASONE SODIUM PHOSPHATE 10 MG/ML IJ SOLN
10.0000 mg | Freq: Once | INTRAMUSCULAR | Status: AC
  Administered 2018-09-16: 10 mg via INTRAVENOUS

## 2018-09-16 MED ORDER — FAMOTIDINE IN NACL 20-0.9 MG/50ML-% IV SOLN
INTRAVENOUS | Status: AC
  Filled 2018-09-16: qty 50

## 2018-09-16 MED ORDER — SODIUM CHLORIDE 0.9% FLUSH
10.0000 mL | INTRAVENOUS | Status: DC | PRN
  Administered 2018-09-16: 10 mL
  Filled 2018-09-16: qty 10

## 2018-09-16 MED ORDER — SODIUM CHLORIDE 0.9 % IV SOLN
Freq: Once | INTRAVENOUS | Status: AC
  Administered 2018-09-16: 08:00:00 via INTRAVENOUS
  Filled 2018-09-16: qty 250

## 2018-09-16 NOTE — Patient Instructions (Signed)
[facility] Discharge Instructions for Patients Receiving Chemotherapy  Today you received the following chemotherapy agents: Etoposide.  To help prevent nausea and vomiting after your treatment, we encourage you to take your nausea medication as directed.   If you develop nausea and vomiting that is not controlled by your nausea medication, call the clinic.   BELOW ARE SYMPTOMS THAT SHOULD BE REPORTED IMMEDIATELY:  *FEVER GREATER THAN 100.5 F  *CHILLS WITH OR WITHOUT FEVER  NAUSEA AND VOMITING THAT IS NOT CONTROLLED WITH YOUR NAUSEA MEDICATION  *UNUSUAL SHORTNESS OF BREATH  *UNUSUAL BRUISING OR BLEEDING  TENDERNESS IN MOUTH AND THROAT WITH OR WITHOUT PRESENCE OF ULCERS  *URINARY PROBLEMS  *BOWEL PROBLEMS  UNUSUAL RASH Items with * indicate a potential emergency and should be followed up as soon as possible.  Feel free to call the clinic should you have any questions or concerns. The clinic phone number is [phone].  Please show the [facility] at check-in to the Emergency Department and triage nurse.

## 2018-09-18 ENCOUNTER — Inpatient Hospital Stay: Payer: Medicare Other

## 2018-09-18 VITALS — BP 132/83 | HR 102 | Temp 98.0°F | Resp 20

## 2018-09-18 DIAGNOSIS — Z5111 Encounter for antineoplastic chemotherapy: Secondary | ICD-10-CM | POA: Diagnosis not present

## 2018-09-18 DIAGNOSIS — C3491 Malignant neoplasm of unspecified part of right bronchus or lung: Secondary | ICD-10-CM

## 2018-09-18 MED ORDER — PEGFILGRASTIM-CBQV 6 MG/0.6ML ~~LOC~~ SOSY
6.0000 mg | PREFILLED_SYRINGE | Freq: Once | SUBCUTANEOUS | Status: AC
  Administered 2018-09-18: 6 mg via SUBCUTANEOUS

## 2018-09-18 MED ORDER — PEGFILGRASTIM-CBQV 6 MG/0.6ML ~~LOC~~ SOSY
PREFILLED_SYRINGE | SUBCUTANEOUS | Status: AC
  Filled 2018-09-18: qty 0.6

## 2018-10-02 ENCOUNTER — Encounter (HOSPITAL_COMMUNITY): Payer: Self-pay

## 2018-10-02 ENCOUNTER — Ambulatory Visit (HOSPITAL_COMMUNITY)
Admission: RE | Admit: 2018-10-02 | Discharge: 2018-10-02 | Disposition: A | Discharge status: Home / Self Care | Payer: Medicare Other | Source: Ambulatory Visit | Attending: Internal Medicine | Admitting: Internal Medicine

## 2018-10-02 DIAGNOSIS — C3491 Malignant neoplasm of unspecified part of right bronchus or lung: Secondary | ICD-10-CM | POA: Insufficient documentation

## 2018-10-02 MED ORDER — SODIUM CHLORIDE (PF) 0.9 % IJ SOLN
INTRAMUSCULAR | Status: AC
  Filled 2018-10-02: qty 50

## 2018-10-02 MED ORDER — HEPARIN SOD (PORK) LOCK FLUSH 100 UNIT/ML IV SOLN
500.0000 [IU] | Freq: Once | INTRAVENOUS | Status: AC
  Administered 2018-10-02: 500 [IU] via INTRAVENOUS

## 2018-10-02 MED ORDER — IOHEXOL 300 MG/ML  SOLN
100.0000 mL | Freq: Once | INTRAMUSCULAR | Status: AC | PRN
  Administered 2018-10-02: 100 mL via INTRAVENOUS

## 2018-10-02 MED ORDER — HEPARIN SOD (PORK) LOCK FLUSH 100 UNIT/ML IV SOLN
INTRAVENOUS | Status: AC
  Filled 2018-10-02: qty 5

## 2018-10-02 MED ORDER — IOHEXOL 300 MG/ML  SOLN
30.0000 mL | Freq: Once | INTRAMUSCULAR | Status: AC | PRN
  Administered 2018-10-02: 30 mL via ORAL

## 2018-10-06 ENCOUNTER — Encounter: Payer: Self-pay | Admitting: Oncology

## 2018-10-06 ENCOUNTER — Inpatient Hospital Stay: Payer: Medicare Other

## 2018-10-06 ENCOUNTER — Other Ambulatory Visit: Payer: Self-pay | Admitting: Medical Oncology

## 2018-10-06 ENCOUNTER — Inpatient Hospital Stay: Payer: Medicare Other | Attending: Internal Medicine

## 2018-10-06 ENCOUNTER — Inpatient Hospital Stay (HOSPITAL_BASED_OUTPATIENT_CLINIC_OR_DEPARTMENT_OTHER): Payer: Medicare Other | Admitting: Oncology

## 2018-10-06 VITALS — BP 104/66 | HR 81 | Temp 98.4°F | Resp 20

## 2018-10-06 VITALS — BP 108/80 | HR 113 | Temp 98.3°F | Resp 18 | Ht 63.0 in | Wt 133.7 lb

## 2018-10-06 DIAGNOSIS — T451X5A Adverse effect of antineoplastic and immunosuppressive drugs, initial encounter: Secondary | ICD-10-CM | POA: Insufficient documentation

## 2018-10-06 DIAGNOSIS — E785 Hyperlipidemia, unspecified: Secondary | ICD-10-CM | POA: Diagnosis not present

## 2018-10-06 DIAGNOSIS — M545 Low back pain, unspecified: Secondary | ICD-10-CM

## 2018-10-06 DIAGNOSIS — C7889 Secondary malignant neoplasm of other digestive organs: Secondary | ICD-10-CM

## 2018-10-06 DIAGNOSIS — D6481 Anemia due to antineoplastic chemotherapy: Secondary | ICD-10-CM

## 2018-10-06 DIAGNOSIS — C3491 Malignant neoplasm of unspecified part of right bronchus or lung: Secondary | ICD-10-CM

## 2018-10-06 DIAGNOSIS — Z791 Long term (current) use of non-steroidal anti-inflammatories (NSAID): Secondary | ICD-10-CM

## 2018-10-06 DIAGNOSIS — Z5111 Encounter for antineoplastic chemotherapy: Secondary | ICD-10-CM | POA: Diagnosis present

## 2018-10-06 DIAGNOSIS — C778 Secondary and unspecified malignant neoplasm of lymph nodes of multiple regions: Secondary | ICD-10-CM

## 2018-10-06 DIAGNOSIS — R5382 Chronic fatigue, unspecified: Secondary | ICD-10-CM

## 2018-10-06 DIAGNOSIS — Z85828 Personal history of other malignant neoplasm of skin: Secondary | ICD-10-CM | POA: Diagnosis not present

## 2018-10-06 DIAGNOSIS — Z5112 Encounter for antineoplastic immunotherapy: Secondary | ICD-10-CM | POA: Insufficient documentation

## 2018-10-06 DIAGNOSIS — Z9221 Personal history of antineoplastic chemotherapy: Secondary | ICD-10-CM

## 2018-10-06 DIAGNOSIS — C3411 Malignant neoplasm of upper lobe, right bronchus or lung: Secondary | ICD-10-CM | POA: Insufficient documentation

## 2018-10-06 DIAGNOSIS — M810 Age-related osteoporosis without current pathological fracture: Secondary | ICD-10-CM

## 2018-10-06 DIAGNOSIS — I10 Essential (primary) hypertension: Secondary | ICD-10-CM | POA: Insufficient documentation

## 2018-10-06 DIAGNOSIS — F329 Major depressive disorder, single episode, unspecified: Secondary | ICD-10-CM

## 2018-10-06 DIAGNOSIS — S22000A Wedge compression fracture of unspecified thoracic vertebra, initial encounter for closed fracture: Secondary | ICD-10-CM

## 2018-10-06 DIAGNOSIS — E46 Unspecified protein-calorie malnutrition: Secondary | ICD-10-CM

## 2018-10-06 DIAGNOSIS — Z79899 Other long term (current) drug therapy: Secondary | ICD-10-CM | POA: Diagnosis not present

## 2018-10-06 DIAGNOSIS — E876 Hypokalemia: Secondary | ICD-10-CM | POA: Insufficient documentation

## 2018-10-06 DIAGNOSIS — Z923 Personal history of irradiation: Secondary | ICD-10-CM | POA: Diagnosis not present

## 2018-10-06 DIAGNOSIS — G8929 Other chronic pain: Secondary | ICD-10-CM

## 2018-10-06 LAB — CBC WITH DIFFERENTIAL (CANCER CENTER ONLY)
Abs Immature Granulocytes: 0.08 10*3/uL — ABNORMAL HIGH (ref 0.00–0.07)
BASOS ABS: 0 10*3/uL (ref 0.0–0.1)
Basophils Relative: 0 %
Eosinophils Absolute: 0 10*3/uL (ref 0.0–0.5)
Eosinophils Relative: 0 %
HCT: 24.3 % — ABNORMAL LOW (ref 36.0–46.0)
Hemoglobin: 8 g/dL — ABNORMAL LOW (ref 12.0–15.0)
Immature Granulocytes: 1 %
Lymphocytes Relative: 18 %
Lymphs Abs: 1.4 10*3/uL (ref 0.7–4.0)
MCH: 32.8 pg (ref 26.0–34.0)
MCHC: 32.9 g/dL (ref 30.0–36.0)
MCV: 99.6 fL (ref 80.0–100.0)
Monocytes Absolute: 0.8 10*3/uL (ref 0.1–1.0)
Monocytes Relative: 9 %
NRBC: 0 % (ref 0.0–0.2)
Neutro Abs: 5.8 10*3/uL (ref 1.7–7.7)
Neutrophils Relative %: 72 %
Platelet Count: 170 10*3/uL (ref 150–400)
RBC: 2.44 MIL/uL — ABNORMAL LOW (ref 3.87–5.11)
RDW: 19.4 % — ABNORMAL HIGH (ref 11.5–15.5)
WBC Count: 8.1 10*3/uL (ref 4.0–10.5)

## 2018-10-06 LAB — URINALYSIS, COMPLETE (UACMP) WITH MICROSCOPIC
Bilirubin Urine: NEGATIVE
Glucose, UA: NEGATIVE mg/dL
Ketones, ur: NEGATIVE mg/dL
NITRITE: NEGATIVE
Protein, ur: NEGATIVE mg/dL
Specific Gravity, Urine: 1.011 (ref 1.005–1.030)
Squamous Epithelial / HPF: >50 — ABNORMAL HIGH (ref 0–5)
pH: 6 (ref 5.0–8.0)

## 2018-10-06 LAB — CMP (CANCER CENTER ONLY)
ALT: 11 U/L (ref 0–44)
AST: 12 U/L — ABNORMAL LOW (ref 15–41)
Albumin: 3.4 g/dL — ABNORMAL LOW (ref 3.5–5.0)
Alkaline Phosphatase: 157 U/L — ABNORMAL HIGH (ref 38–126)
Anion gap: 15 (ref 5–15)
BUN: <4 mg/dL — ABNORMAL LOW (ref 6–20)
CO2: 23 mmol/L (ref 22–32)
Calcium: 7.8 mg/dL — ABNORMAL LOW (ref 8.9–10.3)
Chloride: 102 mmol/L (ref 98–111)
Creatinine: 0.77 mg/dL (ref 0.44–1.00)
GFR, Est AFR Am: >60 mL/min (ref >60)
GFR, Estimated: >60 mL/min (ref >60)
Glucose, Bld: 109 mg/dL — ABNORMAL HIGH (ref 70–99)
Potassium: 3.4 mmol/L — ABNORMAL LOW (ref 3.5–5.1)
Sodium: 140 mmol/L (ref 135–145)
Total Bilirubin: 0.5 mg/dL (ref 0.3–1.2)
Total Protein: 6.8 g/dL (ref 6.5–8.1)

## 2018-10-06 LAB — SAMPLE TO BLOOD BANK

## 2018-10-06 LAB — PREPARE RBC (CROSSMATCH)

## 2018-10-06 LAB — TSH: TSH: 1.792 u[IU]/mL (ref 0.308–3.960)

## 2018-10-06 MED ORDER — SODIUM CHLORIDE 0.9% FLUSH
10.0000 mL | INTRAVENOUS | Status: DC | PRN
  Administered 2018-10-06: 10 mL
  Filled 2018-10-06: qty 10

## 2018-10-06 MED ORDER — DIPHENHYDRAMINE HCL 25 MG PO CAPS
25.0000 mg | ORAL_CAPSULE | Freq: Once | ORAL | Status: AC
  Administered 2018-10-06: 25 mg via ORAL

## 2018-10-06 MED ORDER — SODIUM CHLORIDE 0.9 % IV SOLN
Freq: Once | INTRAVENOUS | Status: AC
  Administered 2018-10-06: 11:00:00 via INTRAVENOUS
  Filled 2018-10-06: qty 250

## 2018-10-06 MED ORDER — DIPHENHYDRAMINE HCL 25 MG PO CAPS
ORAL_CAPSULE | ORAL | Status: AC
  Filled 2018-10-06: qty 1

## 2018-10-06 MED ORDER — ACETAMINOPHEN 325 MG PO TABS
ORAL_TABLET | ORAL | Status: AC
  Filled 2018-10-06: qty 2

## 2018-10-06 MED ORDER — OXYCODONE-ACETAMINOPHEN 5-325 MG PO TABS: 1.0000 | ORAL_TABLET | Freq: Four times a day (QID) | ORAL | 0 refills | Status: DC | PRN

## 2018-10-06 MED ORDER — POTASSIUM CHLORIDE 20 MEQ/15ML (10%) PO SOLN: 40.0000 meq | Freq: Every day | ORAL | 0 refills | Status: DC

## 2018-10-06 MED ORDER — MIRTAZAPINE 15 MG PO TABS: 15.0000 mg | ORAL_TABLET | Freq: Every day | ORAL | 1 refills | Status: DC

## 2018-10-06 MED ORDER — SODIUM CHLORIDE 0.9% IV SOLUTION
250.0000 mL | Freq: Once | INTRAVENOUS | Status: AC
  Administered 2018-10-06: 250 mL via INTRAVENOUS
  Filled 2018-10-06: qty 250

## 2018-10-06 MED ORDER — ACETAMINOPHEN 325 MG PO TABS
650.0000 mg | ORAL_TABLET | Freq: Once | ORAL | Status: AC
  Administered 2018-10-06: 650 mg via ORAL

## 2018-10-06 MED ORDER — HEPARIN SOD (PORK) LOCK FLUSH 100 UNIT/ML IV SOLN
500.0000 [IU] | Freq: Once | INTRAVENOUS | Status: AC | PRN
  Administered 2018-10-06: 500 [IU]
  Filled 2018-10-06: qty 5

## 2018-10-06 MED ORDER — SODIUM CHLORIDE 0.9 % IV SOLN
1200.0000 mg | Freq: Once | INTRAVENOUS | Status: AC
  Administered 2018-10-06: 1200 mg via INTRAVENOUS
  Filled 2018-10-06: qty 20

## 2018-10-06 MED FILL — POTASSIUM CL 10% (20 MEQ/15: 20 MEQ/15ML | 3 days supply | Qty: 90 | Fill #0

## 2018-10-06 MED FILL — MIRTAZAPINE 15 MG TABLET: 15 | 30 days supply | Qty: 30 | Fill #0

## 2018-10-06 MED FILL — OXYCODONE-ACETAMINOPHEN 5-3: 5-325 | 7 days supply | Qty: 30 | Fill #0

## 2018-10-06 NOTE — Progress Notes (Signed)
[facility] OFFICE PROGRESS NOTE  [name], MD [address] [place] [address]  DIAGNOSIS: Extensive stage (T2a, N3, [name], large right anterior mediastinal and supraclavicular lymphadenopathy as well as pancreatic and splenic metastasis diagnosed in September 2017.  The patient had disease progression in October 2019.  PRIOR THERAPY:  1) Systemic chemotherapy was carboplatin for AUC of 5 on day 1 and etoposide 100 MG/M2 on days 1, 2 and 3 with Neulasta support. Status post 6 cycles with significant response of her disease. 2) Prophylactic cranial irradiation under the care of Dr. Sondra Come on 12/02/2016. 3) stereotactic radiotherapy to the recurrent right upper lobe pulmonary nodule under the care of Dr. Sondra Come completed November 10, 2017.  CURRENT THERAPY: Retreatment with systemic chemotherapy with carboplatin for AUC of 5 on day 1 and etoposide 100 mg/M2 on days 1, 2 and 3 as well as Tecentriq (Atezolizumab) 1200 mg IV every 3 weeks with Neulasta support.  First dose June 22, 2018 for disease recurrence.  Status post 4 cycles.  Starting from cycle #2 her dose of carboplatin will be reduced to AUC of 4 and etoposide 80 mg/M2 on days 1, 2 and 3 in addition to the regular dose of Tecentriq.  Beginning with cycle #5, the patient will receive maintenance Tecentriq 1200 mg IV every 3 weeks.  INTERVAL HISTORY: TALEEYA Kidd 58 y.o. female returns for routine follow-up visit accompanied by her husband.  The patient is feeling fine today and has no concerning complaints that for ongoing back pain.  She uses Percocet as needed for severe pain only.  Requests a refill on this today.  Denies fevers and chills.  Denies chest pain, shortness of breath at rest, cough, hemoptysis.  She has her baseline shortness of breath with exertion.  Denies nausea, vomiting, constipation, diarrhea.  Reports a decreased appetite but has not  lost weight since her last visit.  She had a restaging CT scan of the chest, abdomen, pelvis and is here to discuss the results.  MEDICAL HISTORY: Past Medical History:  Diagnosis Date  . 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 Blood type, Rh positive   . Cancer ([facility])   . Chronic pain syndrome   . Depression 08/07/2016  . Encounter for antineoplastic chemotherapy 07/17/2016  . History of external beam radiation therapy 11/19/16-12/02/16   brain 25 Gy in 10 fractions  . Hyperlipidemia   . Hypertension   . Hypertension 08/07/2016  . Osteoporosis     ALLERGIES:  is allergic to codeine; motrin [ibuprofen]; thiazide-type diuretics; and vicodin [hydrocodone-acetaminophen].  MEDICATIONS:  Current Outpatient Medications  Medication Sig Dispense Refill  . celecoxib (CELEBREX) 200 MG capsule Take 200 mg by mouth daily as needed for mild pain.     . diazepam (VALIUM) 5 MG tablet Take 5 mg by mouth 4 (four) times daily as needed for anxiety.   2  . lidocaine-prilocaine (EMLA) cream Apply 1 application topically as needed. (Patient taking differently: Apply 1 application topically daily as needed (access port). ) 30 g 0  . oxyCODONE-acetaminophen (PERCOCET) 5-325 MG tablet Take 1 tablet by mouth every 6 (six) hours as needed for severe pain. 30 tablet 0  . potassium chloride 20 MEQ/15ML (10%) SOLN Take 30 mLs (40 mEq total) by mouth daily. 90 mL 0  . senna-docusate (SENOKOT-S) 8.6-50 MG tablet Take 2 tablets by mouth at bedtime.  5 tablet 0  . traMADol (ULTRAM) 50 MG tablet Take 50 mg by mouth every 6 (six) hours as needed for moderate pain.     . Vitamin D, Ergocalciferol, (DRISDOL) 1.25 MG (50000 UT) CAPS capsule Take 1 capsule (50,000 Units total) by mouth every 7 (seven) days. 5 capsule 0  . diphenhydrAMINE (BENADRYL) 25 MG tablet Take 25 mg by mouth every 6 (six) hours as needed for itching or allergies.     Marland Kitchen loperamide (IMODIUM A-D) 2 MG tablet Take 2 mg by mouth 4 (four) times daily  as needed for diarrhea or loose stools.    . methocarbamol (ROBAXIN) 500 MG tablet Take 1 tablet (500 mg total) by mouth 3 (three) times daily. (Patient not taking: Reported on 10/06/2018) 30 tablet 0  . mirtazapine (REMERON) 15 MG tablet Take 1 tablet (15 mg total) by mouth at bedtime. 30 tablet 1  . polyethylene glycol (MIRALAX / GLYCOLAX) packet Take 17 g by mouth daily. (Patient not taking: Reported on 10/06/2018) 14 each 0   No current facility-administered medications for this visit.    Facility-Administered Medications Ordered in Other Visits  Medication Dose Route Frequency Provider Last Rate Last Dose  . atezolizumab (TECENTRIQ) 1,200 mg in sodium chloride 0.9 % 250 mL chemo infusion  1,200 mg Intravenous Once [name], MD      . heparin lock flush 100 unit/mL  500 Units Intracatheter Once PRN [name], MD      . sodium chloride flush (NS) 0.9 % injection 10 mL  10 mL Intracatheter PRN [name], MD        SURGICAL HISTORY:  Past Surgical History:  Procedure Laterality Date  . ABDOMINAL HYSTERECTOMY  1981  . APPENDECTOMY     at age 49  . EYE SURGERY     left  . IR GENERIC HISTORICAL  06/03/2016   IR FLUORO GUIDE PORT INSERTION RIGHT 06/03/2016 [facility]  . IR GENERIC HISTORICAL  06/03/2016   IR US GUIDE VASC ACCESS RIGHT 06/03/2016 [facility]  . SKIN CANCER EXCISION     basal cell carcinoma L side of face    REVIEW OF SYSTEMS:   Review of Systems  Constitutional: Negative for chills, fatigue, fever and unexpected weight change.  Positive for decreased appetite. HENT:   Negative for mouth sores, nosebleeds, sore throat and trouble swallowing.   Eyes: Negative for eye problems and icterus.  Respiratory: Negative for cough, hemoptysis, shortness of breath at rest and wheezing.  Positive for shortness of breath with exertion. Cardiovascular: Negative for chest pain and leg swelling.  Gastrointestinal: Negative for abdominal pain, constipation, diarrhea,  nausea and vomiting.  Genitourinary: Negative for bladder incontinence, difficulty urinating, dysuria, frequency and hematuria.   Musculoskeletal: Negative for back pain, gait problem, neck pain and neck stiffness.  Skin: Negative for itching and rash.  Neurological: Negative for dizziness, extremity weakness, gait problem, headaches, [name], depression and sleep disturbance. The patient is not nervous/anxious.     PHYSICAL EXAMINATION:  Blood pressure 108/80, pulse (!) 113, temperature 98.3 F (36.8 C), temperature source Oral, resp. rate 18, height 5\' 3"  (1.6 m), weight 133 lb 11.2 oz (60.6 kg), SpO2 100 %.  ECOG PERFORMANCE STATUS: 1 - Symptomatic but completely ambulatory  Physical Exam  Constitutional: Oriented to person, place, and time and well-developed, well-nourished, and in no distress. No distress.  HENT:  Head: Normocephalic and atraumatic.  Mouth/Throat: Oropharynx is clear and moist. No oropharyngeal exudate.  Eyes: Conjunctivae are normal. Right eye exhibits no discharge. Left eye exhibits no discharge. No scleral icterus.  Neck: Normal range of motion. Neck supple.  Cardiovascular: Normal rate, regular rhythm, normal heart sounds and intact distal pulses.   Pulmonary/Chest: Effort normal and breath sounds normal. No respiratory distress. No wheezes. No rales.  Abdominal: Soft. Bowel sounds are normal. Exhibits no distension and no mass. There is no tenderness.  Musculoskeletal: Normal range of motion. Exhibits no edema.  Lymphadenopathy:    No cervical adenopathy.  Neurological: Alert and oriented to person, place, and time. Exhibits normal muscle tone. Gait normal. Coordination normal.  Skin: Skin is warm and dry. No rash noted. Not diaphoretic. No erythema. No pallor.  Psychiatric: Mood, memory and judgment normal.  Vitals  reviewed.  LABORATORY DATA: Lab Results  Component Value Date   WBC 8.1 10/06/2018   HGB 8.0 (L) 10/06/2018   HCT 24.3 (L) 10/06/2018   MCV 99.6 10/06/2018   PLT 170 10/06/2018      Chemistry      Component Value Date/Time   NA 140 10/06/2018 0840   NA 141 08/01/2017 0835   K 3.4 (L) 10/06/2018 0840   K 3.5 08/01/2017 0835   CL 102 10/06/2018 0840   CO2 23 10/06/2018 0840   CO2 25 08/01/2017 0835   BUN <4 (L) 10/06/2018 0840   BUN 8.0 08/01/2017 0835   CREATININE 0.77 10/06/2018 0840   CREATININE 0.9 08/01/2017 0835      Component Value Date/Time   CALCIUM 7.8 (L) 10/06/2018 0840   CALCIUM 9.9 08/01/2017 0835   ALKPHOS 157 (H) 10/06/2018 0840   ALKPHOS 142 08/01/2017 0835   AST 12 (L) 10/06/2018 0840   AST 15 08/01/2017 0835   ALT 11 10/06/2018 0840   ALT 19 08/01/2017 0835   BILITOT 0.5 10/06/2018 0840   BILITOT 0.35 08/01/2017 0835       RADIOGRAPHIC STUDIES:  Ct Chest W Contrast  Result Date: 10/03/2018 CLINICAL DATA:  Extensive stage small cell lung cancer, status post chemotherapy, status post stereotactic radiotherapy to right upper lobe EXAM: CT CHEST, ABDOMEN, AND PELVIS WITH CONTRAST TECHNIQUE: Multidetector CT imaging of the chest, abdomen and pelvis was performed following the standard protocol during bolus administration of intravenous contrast. CONTRAST:  141mL OMNIPAQUE IOHEXOL 300 MG/ML  SOLN COMPARISON:  CT chest abdomen pelvis dated 07/31/2018. CT thoracolumbar spine dated 08/24/2018. FINDINGS: CT CHEST FINDINGS Cardiovascular: Heart is normal in size.  No pericardial effusion. No evidence of thoracic aortic aneurysm. Atherosclerotic calcifications of the aortic arch. Mild coronary atherosclerosis of the LAD. Right chest port terminates the cavoatrial junction. Mediastinum/Nodes: No suspicious mediastinal lymphadenopathy. 9 mm short axis right hilar node (series 2/image 20), within normal limits. Visualized right thyroid is notable for 11 mm calcified  right thyroid nodule. Lungs/Pleura: Radiation changes in the anterior right upper lobe. Underlying 17 x 14 mm nodular opacity (series 6/image 20), grossly unchanged. Otherwise, no suspicious pulmonary nodules. Minimal compressive/dependent atelectasis in the bilateral lower lobes. No focal consolidation. No pleural effusion or pneumothorax. Musculoskeletal: Mild superior endplate changes at T2, new. Mild superior endplate changes at T5, unchanged from recent CT thoracic spine. Mild to moderate compression fracture deformities at T6-8, new from prior CT chest, progressive from recent CT thoracic spine. CT ABDOMEN PELVIS FINDINGS Hepatobiliary: Liver is within normal limits, noting focal fat/altered perfusion along the falciform ligament (series 2/image 49). Gallbladder is unremarkable. No intrahepatic or extrahepatic  ductal dilatation. Pancreas: Within normal limits. Spleen: Within normal limits. Adrenals/Urinary Tract: Adrenal glands are within normal limits. Kidneys are within normal limits.  No hydronephrosis. Bladder is within normal limits. Stomach/Bowel: Stomach is notable for a small hiatal hernia. No evidence of bowel obstruction. Appendix is not discretely visualized, likely surgically absent. Vascular/Lymphatic: No evidence of abdominal aortic aneurysm. Atherosclerotic calcifications of the abdominal aorta and branch vessels. Mild mesenteric stranding along the right jejunal mesentery (series 2/image 70), improved. No associated lymphadenopathy. No suspicious abdominopelvic lymphadenopathy. Reproductive: Status post hysterectomy. No adnexal masses. Other: No abdominopelvic ascites. Musculoskeletal: For numbering purposes, there are 11 thoracic vertebral bodies, a transitional L1 with a left residual rib, and 4 additional lumbar vertebral bodies. Mild superior endplate changes at L2, chronic. IMPRESSION: Radiation changes in the right upper lobe with stable underlying right upper lobe nodule. No findings  suspicious for metastatic disease in the abdomen/pelvis. Progressive thoracic compression fracture deformities, as above. Mild mesenteric stranding in the right mid abdomen, improved. Electronically Signed   By: Julian Hy M.D.   On: 10/03/2018 07:47   Ct Abdomen Pelvis W Contrast  Result Date: 10/03/2018 CLINICAL DATA:  Extensive stage small cell lung cancer, status post chemotherapy, status post stereotactic radiotherapy to right upper lobe EXAM: CT CHEST, ABDOMEN, AND PELVIS WITH CONTRAST TECHNIQUE: Multidetector CT imaging of the chest, abdomen and pelvis was performed following the standard protocol during bolus administration of intravenous contrast. CONTRAST:  133mL OMNIPAQUE IOHEXOL 300 MG/ML  SOLN COMPARISON:  CT chest abdomen pelvis dated 07/31/2018. CT thoracolumbar spine dated 08/24/2018. FINDINGS: CT CHEST FINDINGS Cardiovascular: Heart is normal in size.  No pericardial effusion. No evidence of thoracic aortic aneurysm. Atherosclerotic calcifications of the aortic arch. Mild coronary atherosclerosis of the LAD. Right chest port terminates the cavoatrial junction. Mediastinum/Nodes: No suspicious mediastinal lymphadenopathy. 9 mm short axis right hilar node (series 2/image 20), within normal limits. Visualized right thyroid is notable for 11 mm calcified right thyroid nodule. Lungs/Pleura: Radiation changes in the anterior right upper lobe. Underlying 17 x 14 mm nodular opacity (series 6/image 20), grossly unchanged. Otherwise, no suspicious pulmonary nodules. Minimal compressive/dependent atelectasis in the bilateral lower lobes. No focal consolidation. No pleural effusion or pneumothorax. Musculoskeletal: Mild superior endplate changes at T2, new. Mild superior endplate changes at T5, unchanged from recent CT thoracic spine. Mild to moderate compression fracture deformities at T6-8, new from prior CT chest, progressive from recent CT thoracic spine. CT ABDOMEN PELVIS FINDINGS Hepatobiliary:  Liver is within normal limits, noting focal fat/altered perfusion along the falciform ligament (series 2/image 49). Gallbladder is unremarkable. No intrahepatic or extrahepatic ductal dilatation. Pancreas: Within normal limits. Spleen: Within normal limits. Adrenals/Urinary Tract: Adrenal glands are within normal limits. Kidneys are within normal limits.  No hydronephrosis. Bladder is within normal limits. Stomach/Bowel: Stomach is notable for a small hiatal hernia. No evidence of bowel obstruction. Appendix is not discretely visualized, likely surgically absent. Vascular/Lymphatic: No evidence of abdominal aortic aneurysm. Atherosclerotic calcifications of the abdominal aorta and branch vessels. Mild mesenteric stranding along the right jejunal mesentery (series 2/image 70), improved. No associated lymphadenopathy. No suspicious abdominopelvic lymphadenopathy. Reproductive: Status post hysterectomy. No adnexal masses. Other: No abdominopelvic ascites. Musculoskeletal: For numbering purposes, there are 11 thoracic vertebral bodies, a transitional L1 with a left residual rib, and 4 additional lumbar vertebral bodies. Mild superior endplate changes at L2, chronic. IMPRESSION: Radiation changes in the right upper lobe with stable underlying right upper lobe nodule. No findings suspicious for metastatic disease in the  abdomen/pelvis. Progressive thoracic compression fracture deformities, as above. Mild mesenteric stranding in the right mid abdomen, improved. Electronically Signed   By: Julian Hy M.D.   On: 10/03/2018 07:47     ASSESSMENT/PLAN:  Small cell carcinoma of right lung [facility]) This is a very pleasant 58 year old white female with extensive stage small cell lung cancer status post systemic chemotherapy with carbo platinum and etoposide for 6 cycles and the patient rotated her treatment well except for chemotherapy-induced anemia and requirement for PRBCs and platelet transfusion. She had significant  improvement in her disease with the chemotherapy. She also had prophylactic cranial irradiation. She also underwent stereotactic radiotherapy to progressive right upper lobe pulmonary nodule. The patient has been in observation for close to 2 years. Repeat CT scan of the chest, abdomen and pelvis performed recently showed evidence for disease progression in the abdomen. The patient was started on systemic chemotherapy again with carboplatin, etoposide and Tecentriq status post 4 cycles. The patient has been tolerating this treatment well except for significant pancytopenia and electrolyte abnormalities which have significantly improved. She had a restaging CT scan of the chest, abdomen, pelvis and is here to discuss the results.  The patient was seen with Dr. Julien Nordmann.  CT scan results were discussed with the patient and her husband.  CT scan shows no evidence of disease progression.  Recommend for the patient to discontinue the carboplatin and etoposide at this time.  She will continue on maintenance Tecentriq every 3 weeks.  Proceed with cycle #5 of her treatment today as scheduled.  The patient has chemotherapy induced anemia.  We will arrange for her to receive 2 days of packed red blood cells today in the infusion area.  For the decreased appetite, I have referred her to the dietitian.  We will also start her on Remeron 15 mg at bedtime daily.  For the back pain, refill of Percocet was given to her today.  The patient continues to have mild hypokalemia.  Her potassium supplementation was refilled today.  The patient will follow-up in 3 weeks for evaluation prior to cycle #6 of maintenance Tecentriq.  The patient was advised to call immediately if she has any concerning symptoms in the interval. The patient voices understanding of current disease status and treatment options and is in agreement with the current care plan. All questions were answered. The patient knows to call the clinic  with any problems, questions or concerns. We can certainly see the patient much sooner if necessary.   Orders Placed This Encounter  Procedures  . Amb Referral to Nutrition and Diabetic E    Referral Priority:   Routine    Referral Type:   Consultation    Referral Reason:   Specialty Services Required    Number of Visits Requested:   1  . Practitioner attestation of consent    I, the ordering practitioner, attest that I have discussed with the patient the benefits, risks, side effects, alternatives, likelihood of achieving goals and potential problems during recovery for the procedure listed.    Standing Status:   Future    Standing Expiration Date:   10/06/2019    Order Specific Question:   Procedure    Answer:   Blood Product(s)  . Complete patient signature process for consent form    Standing Status:   Future    Standing Expiration Date:   10/06/2019  . Care order/instruction    Transfuse Parameters    Standing Status:   Future  Standing Expiration Date:   10/06/2019  . Type and screen         Standing Status:   Future    Number of Occurrences:   1    Standing Expiration Date:   10/07/2019  . Prepare RBC    Standing Status:   Standing    Number of Occurrences:   1    Order Specific Question:   # of Units    Answer:   2 units    Order Specific Question:   Transfusion Indications    Answer:   Symptomatic Anemia    Order Specific Question:   If emergent release call blood bank    Answer:   Not emergent release     Mikey Bussing, DNP, AGPCNP-BC, AOCNP 10/06/18   ADDENDUM: Hematology/Oncology Attending: I had a face-to-face encounter with the patient today.  They recommended his care plan.  This is a very pleasant 58 years old white female with recurrent small cell lung cancer.  She was a started on systemic chemotherapy with carboplatin, etoposide and Tecentriq status post 4 cycles.  She tolerated her treatment well except for pancytopenia.  The patient had repeat CT scan of  the chest, abdomen and pelvis performed recently.  I personally and independently reviewed her scans and discussed the results with the patient and her husband. Her scan showed no concerning findings for disease progression. I recommended for the patient to continue on maintenance treatment with single agent Tecentriq and she will start cycle #1 of this treatment today. She will come back for follow-up visit in 3 weeks for evaluation before starting cycle #2 of her treatment. The patient was advised to call immediately if she has any concerning symptoms in the interval.  Disclaimer: This note was dictated with voice recognition software. Similar sounding words can inadvertently be transcribed and may be missed upon review. Eilleen Kempf, MD 10/06/18

## 2018-10-06 NOTE — Assessment & Plan Note (Signed)
This is a very pleasant 58 year old white female with extensive stage small cell lung cancer status post systemic chemotherapy with carbo platinum and etoposide for 6 cycles and the patient rotated her treatment well except for chemotherapy-induced anemia and requirement for PRBCs and platelet transfusion. She had significant improvement in her disease with the chemotherapy. She also had prophylactic cranial irradiation. She also underwent stereotactic radiotherapy to progressive right upper lobe pulmonary nodule. The patient has been in observation for close to 2 years. Repeat CT scan of the chest, abdomen and pelvis performed recently showed evidence for disease progression in the abdomen. The patient was started on systemic chemotherapy again with carboplatin, etoposide and Tecentriq status post 4 cycles. The patient has been tolerating this treatment well except for significant pancytopenia and electrolyte abnormalities which have significantly improved. She had a restaging CT scan of the chest, abdomen, pelvis and is here to discuss the results.  The patient was seen with Dr. Julien Nordmann.  CT scan results were discussed with the patient and her husband.  CT scan shows no evidence of disease progression.  Recommend for the patient to discontinue the carboplatin and etoposide at this time.  She will continue on maintenance Tecentriq every 3 weeks.  Proceed with cycle #5 of her treatment today as scheduled.  The patient has chemotherapy induced anemia.  We will arrange for her to receive 2 days of packed red blood cells today in the infusion area.  For the decreased appetite, I have referred her to the dietitian.  We will also start her on Remeron 15 mg at bedtime daily.  For the back pain, refill of Percocet was given to her today.  The patient continues to have mild hypokalemia.  Her potassium supplementation was refilled today.  The patient will follow-up in 3 weeks for evaluation prior to cycle  #6 of maintenance Tecentriq.  The patient was advised to call immediately if she has any concerning symptoms in the interval. The patient voices understanding of current disease status and treatment options and is in agreement with the current care plan. All questions were answered. The patient knows to call the clinic with any problems, questions or concerns. We can certainly see the patient much sooner if necessary.

## 2018-10-06 NOTE — Patient Instructions (Signed)
[facility] Discharge Instructions for Patients Receiving Chemotherapy  Today you received the following chemotherapy agents Atezolizumab [facility]).  To help prevent nausea and vomiting after your treatment, we encourage you to take your nausea medication as prescribed.   If you develop nausea and vomiting that is not controlled by your nausea medication, call the clinic.   BELOW ARE SYMPTOMS THAT SHOULD BE REPORTED IMMEDIATELY:  *FEVER GREATER THAN 100.5 F  *CHILLS WITH OR WITHOUT FEVER  NAUSEA AND VOMITING THAT IS NOT CONTROLLED WITH YOUR NAUSEA MEDICATION  *UNUSUAL SHORTNESS OF BREATH  *UNUSUAL BRUISING OR BLEEDING  TENDERNESS IN MOUTH AND THROAT WITH OR WITHOUT PRESENCE OF ULCERS  *URINARY PROBLEMS  *BOWEL PROBLEMS  UNUSUAL RASH Items with * indicate a potential emergency and should be followed up as soon as possible.  Feel free to call the clinic should you have any questions or concerns. The clinic phone number is [phone].  Please show the [facility] at check-in to the Emergency Department and triage nurse.   Blood Transfusion, Adult, Care After This sheet gives you information about how to care for yourself after your procedure. Your doctor may also give you more specific instructions. If you have problems or questions, contact your doctor. Follow these instructions at home:   Take over-the-counter and prescription medicines only as told by your doctor.  Go back to your normal activities as told by your doctor.  Follow instructions from your doctor about how to take care of the area where an IV tube was put into your vein (insertion site). Make sure you: ? Wash your hands with soap and water before you change your bandage (dressing). If there is no soap and water, use hand sanitizer. ? Change your bandage as told by your doctor.  Check your IV insertion site every day for signs of infection. Check for: ? More redness, swelling, or  pain. ? More fluid or blood. ? Warmth. ? Pus or a bad smell. Contact a doctor if:  You have more redness, swelling, or pain around the IV insertion site.  You have more fluid or blood coming from the IV insertion site.  Your IV insertion site feels warm to the touch.  You have pus or a bad smell coming from the IV insertion site.  Your pee (urine) turns pink, red, or brown.  You feel weak after doing your normal activities. Get help right away if:  You have signs of a serious allergic or body defense (immune) system reaction, including: ? Itchiness. ? Hives. ? Trouble breathing. ? Anxiety. ? Pain in your chest or lower back. ? Fever, flushing, and chills. ? Fast pulse. ? Rash. ? Watery poop (diarrhea). ? Throwing up (vomiting). ? Dark pee. ? Serious headache. ? Dizziness. ? Stiff neck. ? Yellow color in your face or the white parts of your eyes (jaundice). Summary  After a blood transfusion, return to your normal activities as told by your doctor.  Every day, check for signs of infection where the IV tube was put into your vein.  Some signs of infection are warm skin, more redness and pain, more fluid or blood, and pus or a bad smell where the needle went in.  Contact your doctor if you feel weak or have any unusual symptoms. This information is not intended to replace advice given to you by your health care provider. Make sure you discuss any questions you have with your health care provider. Document Released: 09/09/2014 Document Revised:  04/12/2016 Document Reviewed: 04/12/2016 Elsevier Interactive Patient Education  [employer].

## 2018-10-07 ENCOUNTER — Inpatient Hospital Stay: Payer: Medicare Other

## 2018-10-07 LAB — BPAM RBC
Blood Product Expiration Date: 202002202359
Blood Product Expiration Date: 202002202359
ISSUE DATE / TIME: 202002041159
ISSUE DATE / TIME: 202002041159
Unit Type and Rh: 600
Unit Type and Rh: 600

## 2018-10-07 LAB — TYPE AND SCREEN
ABO/RH(D): A NEG
Antibody Screen: NEGATIVE
UNIT DIVISION: 0
Unit division: 0

## 2018-10-08 ENCOUNTER — Inpatient Hospital Stay: Payer: Medicare Other

## 2018-10-10 ENCOUNTER — Inpatient Hospital Stay: Payer: Medicare Other

## 2018-10-27 ENCOUNTER — Inpatient Hospital Stay: Payer: Medicare Other

## 2018-10-27 ENCOUNTER — Other Ambulatory Visit: Payer: Self-pay | Admitting: Medical Oncology

## 2018-10-27 ENCOUNTER — Telehealth: Payer: Self-pay | Admitting: Internal Medicine

## 2018-10-27 ENCOUNTER — Encounter: Payer: Self-pay | Admitting: Internal Medicine

## 2018-10-27 ENCOUNTER — Inpatient Hospital Stay (HOSPITAL_BASED_OUTPATIENT_CLINIC_OR_DEPARTMENT_OTHER): Payer: Medicare Other | Admitting: Internal Medicine

## 2018-10-27 VITALS — BP 127/90 | HR 99 | Temp 98.1°F | Resp 17 | Ht 63.0 in | Wt 133.4 lb

## 2018-10-27 DIAGNOSIS — C3491 Malignant neoplasm of unspecified part of right bronchus or lung: Secondary | ICD-10-CM

## 2018-10-27 DIAGNOSIS — T451X5A Adverse effect of antineoplastic and immunosuppressive drugs, initial encounter: Secondary | ICD-10-CM

## 2018-10-27 DIAGNOSIS — C3411 Malignant neoplasm of upper lobe, right bronchus or lung: Secondary | ICD-10-CM | POA: Diagnosis not present

## 2018-10-27 DIAGNOSIS — I10 Essential (primary) hypertension: Secondary | ICD-10-CM

## 2018-10-27 DIAGNOSIS — D6481 Anemia due to antineoplastic chemotherapy: Secondary | ICD-10-CM

## 2018-10-27 DIAGNOSIS — Z923 Personal history of irradiation: Secondary | ICD-10-CM

## 2018-10-27 DIAGNOSIS — F329 Major depressive disorder, single episode, unspecified: Secondary | ICD-10-CM

## 2018-10-27 DIAGNOSIS — C778 Secondary and unspecified malignant neoplasm of lymph nodes of multiple regions: Secondary | ICD-10-CM

## 2018-10-27 DIAGNOSIS — R5382 Chronic fatigue, unspecified: Secondary | ICD-10-CM

## 2018-10-27 DIAGNOSIS — E785 Hyperlipidemia, unspecified: Secondary | ICD-10-CM

## 2018-10-27 DIAGNOSIS — Z5112 Encounter for antineoplastic immunotherapy: Secondary | ICD-10-CM | POA: Diagnosis not present

## 2018-10-27 DIAGNOSIS — G8929 Other chronic pain: Secondary | ICD-10-CM

## 2018-10-27 DIAGNOSIS — C7989 Secondary malignant neoplasm of other specified sites: Secondary | ICD-10-CM | POA: Diagnosis not present

## 2018-10-27 DIAGNOSIS — E876 Hypokalemia: Secondary | ICD-10-CM

## 2018-10-27 DIAGNOSIS — Z791 Long term (current) use of non-steroidal anti-inflammatories (NSAID): Secondary | ICD-10-CM

## 2018-10-27 DIAGNOSIS — Z85828 Personal history of other malignant neoplasm of skin: Secondary | ICD-10-CM

## 2018-10-27 DIAGNOSIS — Z9221 Personal history of antineoplastic chemotherapy: Secondary | ICD-10-CM

## 2018-10-27 DIAGNOSIS — M810 Age-related osteoporosis without current pathological fracture: Secondary | ICD-10-CM

## 2018-10-27 DIAGNOSIS — Z79899 Other long term (current) drug therapy: Secondary | ICD-10-CM

## 2018-10-27 LAB — CBC WITH DIFFERENTIAL (CANCER CENTER ONLY)
Abs Immature Granulocytes: 0.03 10*3/uL (ref 0.00–0.07)
Basophils Absolute: 0 10*3/uL (ref 0.0–0.1)
Basophils Relative: 0 %
Eosinophils Absolute: 0.1 10*3/uL (ref 0.0–0.5)
Eosinophils Relative: 1 %
HCT: 35.4 % — ABNORMAL LOW (ref 36.0–46.0)
Hemoglobin: 11.8 g/dL — ABNORMAL LOW (ref 12.0–15.0)
IMMATURE GRANULOCYTES: 0 %
Lymphocytes Relative: 17 %
Lymphs Abs: 1.3 10*3/uL (ref 0.7–4.0)
MCH: 33 pg (ref 26.0–34.0)
MCHC: 33.3 g/dL (ref 30.0–36.0)
MCV: 98.9 fL (ref 80.0–100.0)
Monocytes Absolute: 0.6 10*3/uL (ref 0.1–1.0)
Monocytes Relative: 8 %
NEUTROS PCT: 74 %
Neutro Abs: 5.7 10*3/uL (ref 1.7–7.7)
Platelet Count: 234 10*3/uL (ref 150–400)
RBC: 3.58 MIL/uL — ABNORMAL LOW (ref 3.87–5.11)
RDW: 19.2 % — ABNORMAL HIGH (ref 11.5–15.5)
WBC Count: 7.8 10*3/uL (ref 4.0–10.5)
nRBC: 0 % (ref 0.0–0.2)

## 2018-10-27 LAB — CMP (CANCER CENTER ONLY)
ALBUMIN: 3.5 g/dL (ref 3.5–5.0)
ALT: 26 U/L (ref 0–44)
AST: 21 U/L (ref 15–41)
Alkaline Phosphatase: 143 U/L — ABNORMAL HIGH (ref 38–126)
Anion gap: 13 (ref 5–15)
BILIRUBIN TOTAL: 0.4 mg/dL (ref 0.3–1.2)
BUN: 6 mg/dL (ref 6–20)
CO2: 27 mmol/L (ref 22–32)
Calcium: 8.7 mg/dL — ABNORMAL LOW (ref 8.9–10.3)
Chloride: 101 mmol/L (ref 98–111)
Creatinine: 0.77 mg/dL (ref 0.44–1.00)
GFR, Est AFR Am: >60 mL/min (ref >60)
GFR, Estimated: >60 mL/min (ref >60)
Glucose, Bld: 111 mg/dL — ABNORMAL HIGH (ref 70–99)
Potassium: 3 mmol/L — CL (ref 3.5–5.1)
SODIUM: 141 mmol/L (ref 135–145)
Total Protein: 6.6 g/dL (ref 6.5–8.1)

## 2018-10-27 LAB — TSH: TSH: 1.528 u[IU]/mL (ref 0.308–3.960)

## 2018-10-27 MED ORDER — HEPARIN SOD (PORK) LOCK FLUSH 100 UNIT/ML IV SOLN
500.0000 [IU] | Freq: Once | INTRAVENOUS | Status: AC | PRN
  Administered 2018-10-27: 500 [IU]
  Filled 2018-10-27: qty 5

## 2018-10-27 MED ORDER — SODIUM CHLORIDE 0.9% FLUSH
10.0000 mL | INTRAVENOUS | Status: DC | PRN
  Administered 2018-10-27: 10 mL
  Filled 2018-10-27: qty 10

## 2018-10-27 MED ORDER — SODIUM CHLORIDE 0.9 % IV SOLN
1200.0000 mg | Freq: Once | INTRAVENOUS | Status: AC
  Administered 2018-10-27: 1200 mg via INTRAVENOUS
  Filled 2018-10-27: qty 20

## 2018-10-27 MED ORDER — POTASSIUM CHLORIDE 20 MEQ/15ML (10%) PO SOLN: 40.0000 meq | Freq: Every day | ORAL | 0 refills | Status: DC

## 2018-10-27 MED ORDER — SODIUM CHLORIDE 0.9 % IV SOLN
Freq: Once | INTRAVENOUS | Status: AC
  Administered 2018-10-27: 09:00:00 via INTRAVENOUS
  Filled 2018-10-27: qty 250

## 2018-10-27 MED FILL — POTASSIUM CHLORIDE 20 MEQ/1: 20 MEQ/15ML | 3 days supply | Qty: 90 | Fill #0

## 2018-10-27 NOTE — Progress Notes (Signed)
[facility] Telephone:([phone]   Fax:([phone]) [phone]  OFFICE PROGRESS NOTE  [name], MD [address] [place] [address]  DIAGNOSIS: Extensive stage (T2a, N3, [name], large right anterior mediastinal and supraclavicular lymphadenopathy as well as pancreatic and splenic metastasis diagnosed in September 2017.  The patient had disease progression in October 2019.  PRIOR THERAPY:  1) Systemic chemotherapy was carboplatin for AUC of 5 on day 1 and etoposide 100 MG/M2 on days 1, 2 and 3 with Neulasta support. Status post 6 cycles with significant response of her disease. 2) Prophylactic cranial irradiation under the care of Dr. Sondra Come on 12/02/2016. 3) stereotactic radiotherapy to the recurrent right upper lobe pulmonary nodule under the care of Dr. Sondra Come completed November 10, 2017.  CURRENT THERAPY: Rate treatment with systemic chemotherapy with carboplatin for AUC of 5 on day 1 and etoposide 100 mg/M2 on days 1, 2 and 3 as well as Tecentriq (Atezolizumab) 1200 mg IV every 3 weeks with Neulasta support.  First dose June 22, 2018 for disease recurrence.  Status post 5 cycles.  Starting from cycle #2 her dose of carboplatin will be reduced to AUC of 4 and etoposide 80 mg/M2 on days 1, 2 and 3 in addition to the regular dose of Tecentriq.  INTERVAL HISTORY: Debra Kidd 58 y.o. female returns to the clinic today for follow-up visit accompanied by her husband.  The patient tolerated the first cycle of her maintenance treatment with single agent Tecentriq fairly well.  She denied having any chest pain, shortness of breath, cough or hemoptysis.  She denied having any fever or chills.  She has no nausea, vomiting, diarrhea or constipation.  She has few spots of skin rash on the chest area.  She denied having any headache or visual changes.  She is here today for evaluation before starting cycle #2 of her  maintenance treatment.  MEDICAL HISTORY: Past Medical History:  Diagnosis Date  . 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 Blood type, Rh positive   . Cancer ([employer])   . Chronic pain syndrome   . Depression 08/07/2016  . Encounter for antineoplastic chemotherapy 07/17/2016  . History of external beam radiation therapy 11/19/16-12/02/16   brain 25 Gy in 10 fractions  . Hyperlipidemia   . Hypertension   . Hypertension 08/07/2016  . Osteoporosis     ALLERGIES:  is allergic to codeine; motrin [ibuprofen]; thiazide-type diuretics; and vicodin [hydrocodone-acetaminophen].  MEDICATIONS:  Current Outpatient Medications  Medication Sig Dispense Refill  . celecoxib (CELEBREX) 200 MG capsule Take 200 mg by mouth daily as needed for mild pain.     . diazepam (VALIUM) 5 MG tablet Take 5 mg by mouth 4 (four) times daily as needed for anxiety.   2  . diphenhydrAMINE (BENADRYL) 25 MG tablet Take 25 mg by mouth every 6 (six) hours as needed for itching or allergies.     Marland Kitchen lidocaine-prilocaine (EMLA) cream Apply 1 application topically as needed. (Patient taking differently: Apply 1 application topically daily as needed (access port). ) 30 g 0  . loperamide (IMODIUM A-D) 2 MG tablet Take 2 mg by mouth 4 (four) times daily as needed for diarrhea or loose stools.    . methocarbamol (ROBAXIN) 500 MG tablet Take 1 tablet (500 mg total) by mouth 3 (three) times daily. (Patient not taking: Reported on 10/06/2018)  30 tablet 0  . mirtazapine (REMERON) 15 MG tablet Take 1 tablet (15 mg total) by mouth at bedtime. 30 tablet 1  . oxyCODONE-acetaminophen (PERCOCET) 5-325 MG tablet Take 1 tablet by mouth every 6 (six) hours as needed for severe pain. 30 tablet 0  . polyethylene glycol (MIRALAX / GLYCOLAX) packet Take 17 g by mouth daily. (Patient not taking: Reported on 10/06/2018) 14 each 0  . potassium chloride 20 MEQ/15ML (10%) SOLN Take 30 mLs (40 mEq total) by mouth daily. 90 mL 0  . senna-docusate  (SENOKOT-S) 8.6-50 MG tablet Take 2 tablets by mouth at bedtime. 5 tablet 0  . traMADol (ULTRAM) 50 MG tablet Take 50 mg by mouth every 6 (six) hours as needed for moderate pain.     . Vitamin D, Ergocalciferol, (DRISDOL) 1.25 MG (50000 UT) CAPS capsule Take 1 capsule (50,000 Units total) by mouth every 7 (seven) days. 5 capsule 0   No current facility-administered medications for this visit.     SURGICAL HISTORY:  Past Surgical History:  Procedure Laterality Date  . ABDOMINAL HYSTERECTOMY  1981  . APPENDECTOMY     at age 31  . EYE SURGERY     left  . IR GENERIC HISTORICAL  06/03/2016   IR FLUORO GUIDE PORT INSERTION RIGHT 06/03/2016 [facility]  . IR GENERIC HISTORICAL  06/03/2016   IR US GUIDE VASC ACCESS RIGHT 06/03/2016 [facility]  . SKIN CANCER EXCISION     basal cell carcinoma L side of face    REVIEW OF SYSTEMS:  A comprehensive review of systems was negative except for: Respiratory: positive for dyspnea on exertion   PHYSICAL EXAMINATION: General appearance: alert, cooperative and no distress Head: Normocephalic, without obvious abnormality, atraumatic Neck: no adenopathy, no JVD, supple, symmetrical, trachea midline and thyroid not enlarged, symmetric, no tenderness/mass/nodules Lymph nodes: Cervical, supraclavicular, and axillary nodes normal. Resp: clear to auscultation bilaterally Back: symmetric, no curvature. ROM normal. No CVA tenderness. Cardio: regular rate and rhythm, S1, S2 normal, no murmur, click, rub or gallop GI: soft, non-tender; bowel sounds normal; no masses,  no organomegaly Extremities: extremities normal, atraumatic, no cyanosis or edema  ECOG PERFORMANCE STATUS: 1 - Symptomatic but completely ambulatory  Blood pressure 127/90, pulse 99, temperature 98.1 F (36.7 C), temperature source Oral, resp. rate 17, height 5\' 3"  (1.6 m), weight 133 lb 6.4 oz (60.5 kg), SpO2 99 %.  LABORATORY DATA: Lab Results  Component Value Date   WBC 8.1 10/06/2018    HGB 8.0 (L) 10/06/2018   HCT 24.3 (L) 10/06/2018   MCV 99.6 10/06/2018   PLT 170 10/06/2018      Chemistry      Component Value Date/Time   NA 140 10/06/2018 0840   NA 141 08/01/2017 0835   K 3.4 (L) 10/06/2018 0840   K 3.5 08/01/2017 0835   CL 102 10/06/2018 0840   CO2 23 10/06/2018 0840   CO2 25 08/01/2017 0835   BUN <4 (L) 10/06/2018 0840   BUN 8.0 08/01/2017 0835   CREATININE 0.77 10/06/2018 0840   CREATININE 0.9 08/01/2017 0835      Component Value Date/Time   CALCIUM 7.8 (L) 10/06/2018 0840   CALCIUM 9.9 08/01/2017 0835   ALKPHOS 157 (H) 10/06/2018 0840   ALKPHOS 142 08/01/2017 0835   AST 12 (L) 10/06/2018 0840   AST 15 08/01/2017 0835   ALT 11 10/06/2018 0840   ALT 19 08/01/2017 0835   BILITOT 0.5 10/06/2018 0840   BILITOT 0.35 08/01/2017  0835       RADIOGRAPHIC STUDIES: Ct Chest W Contrast  Result Date: 10/03/2018 CLINICAL DATA:  Extensive stage small cell lung cancer, status post chemotherapy, status post stereotactic radiotherapy to right upper lobe EXAM: CT CHEST, ABDOMEN, AND PELVIS WITH CONTRAST TECHNIQUE: Multidetector CT imaging of the chest, abdomen and pelvis was performed following the standard protocol during bolus administration of intravenous contrast. CONTRAST:  139mL OMNIPAQUE IOHEXOL 300 MG/ML  SOLN COMPARISON:  CT chest abdomen pelvis dated 07/31/2018. CT thoracolumbar spine dated 08/24/2018. FINDINGS: CT CHEST FINDINGS Cardiovascular: Heart is normal in size.  No pericardial effusion. No evidence of thoracic aortic aneurysm. Atherosclerotic calcifications of the aortic arch. Mild coronary atherosclerosis of the LAD. Right chest port terminates the cavoatrial junction. Mediastinum/Nodes: No suspicious mediastinal lymphadenopathy. 9 mm short axis right hilar node (series 2/image 20), within normal limits. Visualized right thyroid is notable for 11 mm calcified right thyroid nodule. Lungs/Pleura: Radiation changes in the anterior right upper lobe.  Underlying 17 x 14 mm nodular opacity (series 6/image 20), grossly unchanged. Otherwise, no suspicious pulmonary nodules. Minimal compressive/dependent atelectasis in the bilateral lower lobes. No focal consolidation. No pleural effusion or pneumothorax. Musculoskeletal: Mild superior endplate changes at T2, new. Mild superior endplate changes at T5, unchanged from recent CT thoracic spine. Mild to moderate compression fracture deformities at T6-8, new from prior CT chest, progressive from recent CT thoracic spine. CT ABDOMEN PELVIS FINDINGS Hepatobiliary: Liver is within normal limits, noting focal fat/altered perfusion along the falciform ligament (series 2/image 49). Gallbladder is unremarkable. No intrahepatic or extrahepatic ductal dilatation. Pancreas: Within normal limits. Spleen: Within normal limits. Adrenals/Urinary Tract: Adrenal glands are within normal limits. Kidneys are within normal limits.  No hydronephrosis. Bladder is within normal limits. Stomach/Bowel: Stomach is notable for a small hiatal hernia. No evidence of bowel obstruction. Appendix is not discretely visualized, likely surgically absent. Vascular/Lymphatic: No evidence of abdominal aortic aneurysm. Atherosclerotic calcifications of the abdominal aorta and branch vessels. Mild mesenteric stranding along the right jejunal mesentery (series 2/image 70), improved. No associated lymphadenopathy. No suspicious abdominopelvic lymphadenopathy. Reproductive: Status post hysterectomy. No adnexal masses. Other: No abdominopelvic ascites. Musculoskeletal: For numbering purposes, there are 11 thoracic vertebral bodies, a transitional L1 with a left residual rib, and 4 additional lumbar vertebral bodies. Mild superior endplate changes at L2, chronic. IMPRESSION: Radiation changes in the right upper lobe with stable underlying right upper lobe nodule. No findings suspicious for metastatic disease in the abdomen/pelvis. Progressive thoracic compression  fracture deformities, as above. Mild mesenteric stranding in the right mid abdomen, improved. Electronically Signed   By: Julian Hy M.D.   On: 10/03/2018 07:47   Ct Abdomen Pelvis W Contrast  Result Date: 10/03/2018 CLINICAL DATA:  Extensive stage small cell lung cancer, status post chemotherapy, status post stereotactic radiotherapy to right upper lobe EXAM: CT CHEST, ABDOMEN, AND PELVIS WITH CONTRAST TECHNIQUE: Multidetector CT imaging of the chest, abdomen and pelvis was performed following the standard protocol during bolus administration of intravenous contrast. CONTRAST:  130mL OMNIPAQUE IOHEXOL 300 MG/ML  SOLN COMPARISON:  CT chest abdomen pelvis dated 07/31/2018. CT thoracolumbar spine dated 08/24/2018. FINDINGS: CT CHEST FINDINGS Cardiovascular: Heart is normal in size.  No pericardial effusion. No evidence of thoracic aortic aneurysm. Atherosclerotic calcifications of the aortic arch. Mild coronary atherosclerosis of the LAD. Right chest port terminates the cavoatrial junction. Mediastinum/Nodes: No suspicious mediastinal lymphadenopathy. 9 mm short axis right hilar node (series 2/image 20), within normal limits. Visualized right thyroid is notable for 11  mm calcified right thyroid nodule. Lungs/Pleura: Radiation changes in the anterior right upper lobe. Underlying 17 x 14 mm nodular opacity (series 6/image 20), grossly unchanged. Otherwise, no suspicious pulmonary nodules. Minimal compressive/dependent atelectasis in the bilateral lower lobes. No focal consolidation. No pleural effusion or pneumothorax. Musculoskeletal: Mild superior endplate changes at T2, new. Mild superior endplate changes at T5, unchanged from recent CT thoracic spine. Mild to moderate compression fracture deformities at T6-8, new from prior CT chest, progressive from recent CT thoracic spine. CT ABDOMEN PELVIS FINDINGS Hepatobiliary: Liver is within normal limits, noting focal fat/altered perfusion along the falciform  ligament (series 2/image 49). Gallbladder is unremarkable. No intrahepatic or extrahepatic ductal dilatation. Pancreas: Within normal limits. Spleen: Within normal limits. Adrenals/Urinary Tract: Adrenal glands are within normal limits. Kidneys are within normal limits.  No hydronephrosis. Bladder is within normal limits. Stomach/Bowel: Stomach is notable for a small hiatal hernia. No evidence of bowel obstruction. Appendix is not discretely visualized, likely surgically absent. Vascular/Lymphatic: No evidence of abdominal aortic aneurysm. Atherosclerotic calcifications of the abdominal aorta and branch vessels. Mild mesenteric stranding along the right jejunal mesentery (series 2/image 70), improved. No associated lymphadenopathy. No suspicious abdominopelvic lymphadenopathy. Reproductive: Status post hysterectomy. No adnexal masses. Other: No abdominopelvic ascites. Musculoskeletal: For numbering purposes, there are 11 thoracic vertebral bodies, a transitional L1 with a left residual rib, and 4 additional lumbar vertebral bodies. Mild superior endplate changes at L2, chronic. IMPRESSION: Radiation changes in the right upper lobe with stable underlying right upper lobe nodule. No findings suspicious for metastatic disease in the abdomen/pelvis. Progressive thoracic compression fracture deformities, as above. Mild mesenteric stranding in the right mid abdomen, improved. Electronically Signed   By: Julian Hy M.D.   On: 10/03/2018 07:47    ASSESSMENT AND PLAN:  This is a very pleasant 58 years old white female with extensive stage small cell lung cancer status post systemic chemotherapy with carbo platinum and etoposide for 6 cycles and the patient rotated her treatment well except for chemotherapy-induced anemia and requirement for PRBCs and platelet transfusion. She had significant improvement in her disease with the chemotherapy. She also had prophylactic cranial irradiation. She also underwent  stereotactic radiotherapy to progressive right upper lobe pulmonary nodule. The patient has been in observation for close to 2 years. Repeat CT scan of the chest, abdomen and pelvis performed recently showed evidence for disease progression in the abdomen. The patient was started on systemic chemotherapy again with carboplatin, etoposide and Tecentriq status post 5 cycles.  Starting from cycle #5 the patient is treated with maintenance single agent Tecentriq. She tolerated the first cycle of this treatment well. I recommended for her to proceed with cycle #2 of her maintenance treatment today as scheduled. I will see her back for follow-up visit in 3 weeks for evaluation before starting cycle #7. The patient was advised to call immediately if she has any concerning symptoms in the interval. The patient voices understanding of current disease status and treatment options and is in agreement with the current care plan. All questions were answered. The patient knows to call the clinic with any problems, questions or concerns. We can certainly see the patient much sooner if necessary.  Disclaimer: This note was dictated with voice recognition software. Similar sounding words can inadvertently be transcribed and may not be corrected upon review.

## 2018-10-27 NOTE — Patient Instructions (Signed)
[facility] Discharge Instructions for Patients Receiving Chemotherapy  Today you received the following chemotherapy agents: Atezolizumab [facility])  To help prevent nausea and vomiting after your treatment, we encourage you to take your nausea medication as directed.    If you develop nausea and vomiting that is not controlled by your nausea medication, call the clinic.   BELOW ARE SYMPTOMS THAT SHOULD BE REPORTED IMMEDIATELY:  *FEVER GREATER THAN 100.5 F  *CHILLS WITH OR WITHOUT FEVER  NAUSEA AND VOMITING THAT IS NOT CONTROLLED WITH YOUR NAUSEA MEDICATION  *UNUSUAL SHORTNESS OF BREATH  *UNUSUAL BRUISING OR BLEEDING  TENDERNESS IN MOUTH AND THROAT WITH OR WITHOUT PRESENCE OF ULCERS  *URINARY PROBLEMS  *BOWEL PROBLEMS  UNUSUAL RASH Items with * indicate a potential emergency and should be followed up as soon as possible.  Feel free to call the clinic should you have any questions or concerns. The clinic phone number is [phone].  Please show the [facility] at check-in to the Emergency Department and triage nurse.

## 2018-10-27 NOTE — Progress Notes (Signed)
Nutrition  RD missed patient in infusion due to shorten session 1.5 hour infusion vs 7 hour infusion.  RD to follow up with patient on 3/17 during infusion  Isidora Laham B. Zenia Resides, [place], [place] Registered Dietitian [phone] (pager)

## 2018-10-27 NOTE — Telephone Encounter (Signed)
Schedule aptp per 2/25 los - pt to get an updated schedule next visit.

## 2018-10-28 ENCOUNTER — Ambulatory Visit: Payer: Medicare Other

## 2018-10-29 ENCOUNTER — Ambulatory Visit: Payer: Medicare Other

## 2018-10-31 ENCOUNTER — Ambulatory Visit: Payer: Medicare Other

## 2018-11-13 ENCOUNTER — Other Ambulatory Visit: Payer: Self-pay | Admitting: Internal Medicine

## 2018-11-13 ENCOUNTER — Telehealth: Payer: Self-pay | Admitting: *Deleted

## 2018-11-13 DIAGNOSIS — C3491 Malignant neoplasm of unspecified part of right bronchus or lung: Secondary | ICD-10-CM

## 2018-11-13 MED ORDER — OXYCODONE-ACETAMINOPHEN 5-325 MG PO TABS: 1.0000 | ORAL_TABLET | Freq: Four times a day (QID) | ORAL | 0 refills | Status: DC | PRN

## 2018-11-13 MED FILL — OXYCODONE-ACETAMINOPHEN 5-3: 5-325 | 7 days supply | Qty: 30 | Fill #0

## 2018-11-13 NOTE — Telephone Encounter (Signed)
Received TC from patient asking if it was 'safe' for her to come in next week for her appts and treatment d/t current COV-19 concerns recently.  She also needs a refill of her pain med-percocet.  Attempted to call patient to assure pt it was safe for her to come next week, that we are taking approved precautions for patient safety related to infection control.  No answer on either phone #'s and no vm availability  Dr. Julien Nordmann made aware of her refill request.

## 2018-11-17 ENCOUNTER — Inpatient Hospital Stay: Payer: Medicare Other

## 2018-11-17 ENCOUNTER — Inpatient Hospital Stay: Payer: Medicare Other | Attending: Internal Medicine

## 2018-11-17 ENCOUNTER — Inpatient Hospital Stay (HOSPITAL_BASED_OUTPATIENT_CLINIC_OR_DEPARTMENT_OTHER): Payer: Medicare Other | Admitting: Internal Medicine

## 2018-11-17 ENCOUNTER — Other Ambulatory Visit: Payer: Self-pay

## 2018-11-17 ENCOUNTER — Telehealth: Payer: Self-pay | Admitting: Internal Medicine

## 2018-11-17 ENCOUNTER — Encounter: Payer: Self-pay | Admitting: Internal Medicine

## 2018-11-17 VITALS — BP 118/89 | HR 93 | Temp 98.5°F | Resp 18 | Ht 63.0 in | Wt 130.3 lb

## 2018-11-17 DIAGNOSIS — I10 Essential (primary) hypertension: Secondary | ICD-10-CM | POA: Insufficient documentation

## 2018-11-17 DIAGNOSIS — Z79899 Other long term (current) drug therapy: Secondary | ICD-10-CM | POA: Diagnosis not present

## 2018-11-17 DIAGNOSIS — C7889 Secondary malignant neoplasm of other digestive organs: Secondary | ICD-10-CM | POA: Insufficient documentation

## 2018-11-17 DIAGNOSIS — F329 Major depressive disorder, single episode, unspecified: Secondary | ICD-10-CM | POA: Diagnosis not present

## 2018-11-17 DIAGNOSIS — Z85828 Personal history of other malignant neoplasm of skin: Secondary | ICD-10-CM | POA: Diagnosis not present

## 2018-11-17 DIAGNOSIS — C3411 Malignant neoplasm of upper lobe, right bronchus or lung: Secondary | ICD-10-CM | POA: Insufficient documentation

## 2018-11-17 DIAGNOSIS — Z5112 Encounter for antineoplastic immunotherapy: Secondary | ICD-10-CM

## 2018-11-17 DIAGNOSIS — E785 Hyperlipidemia, unspecified: Secondary | ICD-10-CM

## 2018-11-17 DIAGNOSIS — C3491 Malignant neoplasm of unspecified part of right bronchus or lung: Secondary | ICD-10-CM

## 2018-11-17 DIAGNOSIS — M549 Dorsalgia, unspecified: Secondary | ICD-10-CM | POA: Insufficient documentation

## 2018-11-17 DIAGNOSIS — C778 Secondary and unspecified malignant neoplasm of lymph nodes of multiple regions: Secondary | ICD-10-CM

## 2018-11-17 DIAGNOSIS — M810 Age-related osteoporosis without current pathological fracture: Secondary | ICD-10-CM | POA: Diagnosis not present

## 2018-11-17 DIAGNOSIS — R5382 Chronic fatigue, unspecified: Secondary | ICD-10-CM

## 2018-11-17 LAB — CBC WITH DIFFERENTIAL (CANCER CENTER ONLY)
ABS IMMATURE GRANULOCYTES: 0.03 10*3/uL (ref 0.00–0.07)
Basophils Absolute: 0 10*3/uL (ref 0.0–0.1)
Basophils Relative: 0 %
Eosinophils Absolute: 0.1 10*3/uL (ref 0.0–0.5)
Eosinophils Relative: 1 %
HCT: 35.3 % — ABNORMAL LOW (ref 36.0–46.0)
Hemoglobin: 11.9 g/dL — ABNORMAL LOW (ref 12.0–15.0)
Immature Granulocytes: 0 %
Lymphocytes Relative: 17 %
Lymphs Abs: 1.3 10*3/uL (ref 0.7–4.0)
MCH: 34.2 pg — ABNORMAL HIGH (ref 26.0–34.0)
MCHC: 33.7 g/dL (ref 30.0–36.0)
MCV: 101.4 fL — ABNORMAL HIGH (ref 80.0–100.0)
Monocytes Absolute: 0.5 10*3/uL (ref 0.1–1.0)
Monocytes Relative: 6 %
NEUTROS ABS: 6 10*3/uL (ref 1.7–7.7)
Neutrophils Relative %: 76 %
Platelet Count: 197 10*3/uL (ref 150–400)
RBC: 3.48 MIL/uL — AB (ref 3.87–5.11)
RDW: 18.4 % — ABNORMAL HIGH (ref 11.5–15.5)
WBC Count: 8 10*3/uL (ref 4.0–10.5)
nRBC: 0 % (ref 0.0–0.2)

## 2018-11-17 LAB — CMP (CANCER CENTER ONLY)
ALT: 27 U/L (ref 0–44)
AST: 18 U/L (ref 15–41)
Albumin: 3.3 g/dL — ABNORMAL LOW (ref 3.5–5.0)
Alkaline Phosphatase: 139 U/L — ABNORMAL HIGH (ref 38–126)
Anion gap: 15 (ref 5–15)
BUN: 6 mg/dL (ref 6–20)
CO2: 23 mmol/L (ref 22–32)
Calcium: 9.1 mg/dL (ref 8.9–10.3)
Chloride: 102 mmol/L (ref 98–111)
Creatinine: 0.79 mg/dL (ref 0.44–1.00)
GFR, Estimated: >60 mL/min (ref >60)
Glucose, Bld: 132 mg/dL — ABNORMAL HIGH (ref 70–99)
Potassium: 3 mmol/L — CL (ref 3.5–5.1)
SODIUM: 140 mmol/L (ref 135–145)
Total Bilirubin: 0.4 mg/dL (ref 0.3–1.2)
Total Protein: 7 g/dL (ref 6.5–8.1)

## 2018-11-17 LAB — TSH: TSH: 1.099 u[IU]/mL (ref 0.308–3.960)

## 2018-11-17 MED ORDER — SODIUM CHLORIDE 0.9 % IV SOLN
Freq: Once | INTRAVENOUS | Status: AC
  Administered 2018-11-17: 09:00:00 via INTRAVENOUS
  Filled 2018-11-17: qty 250

## 2018-11-17 MED ORDER — HEPARIN SOD (PORK) LOCK FLUSH 100 UNIT/ML IV SOLN
500.0000 [IU] | Freq: Once | INTRAVENOUS | Status: AC | PRN
  Administered 2018-11-17: 500 [IU]
  Filled 2018-11-17: qty 5

## 2018-11-17 MED ORDER — SODIUM CHLORIDE 0.9% FLUSH
10.0000 mL | INTRAVENOUS | Status: DC | PRN
  Administered 2018-11-17: 10 mL
  Filled 2018-11-17: qty 10

## 2018-11-17 MED ORDER — SODIUM CHLORIDE 0.9 % IV SOLN
1200.0000 mg | Freq: Once | INTRAVENOUS | Status: AC
  Administered 2018-11-17: 1200 mg via INTRAVENOUS
  Filled 2018-11-17: qty 20

## 2018-11-17 MED ORDER — POTASSIUM CHLORIDE 20 MEQ/15ML (10%) PO SOLN: 40.0000 meq | Freq: Every day | ORAL | 0 refills | Status: DC

## 2018-11-17 MED FILL — POTASSIUM CHLORIDE 20 MEQ/1: 20 MEQ/15ML | 3 days supply | Qty: 90 | Fill #0

## 2018-11-17 NOTE — Telephone Encounter (Signed)
Gave spouse avs report and appointments for April and May. Patient already on schedule for appointments requested per 3/17 los.

## 2018-11-17 NOTE — Progress Notes (Signed)
[place] [phone]   [phone]  OFFICE PROGRESS NOTE  [name], MD [address] [place] [address]  DIAGNOSIS: Extensive stage (T2a, N3, [name], large right anterior mediastinal and supraclavicular lymphadenopathy as well as pancreatic and splenic metastasis diagnosed in September 2017.  The patient had disease progression in October 2019.  PRIOR THERAPY:  1) Systemic chemotherapy was carboplatin for AUC of 5 on day 1 and etoposide 100 MG/M2 on days 1, 2 and 3 with Neulasta support. Status post 6 cycles with significant response of her disease. 2) Prophylactic cranial irradiation under the care of Dr. Sondra Come on 12/02/2016. 3) stereotactic radiotherapy to the recurrent right upper lobe pulmonary nodule under the care of Dr. Sondra Come completed November 10, 2017. 4) Retreatment with systemic chemotherapy with carboplatin for AUC of 5 on day 1 and etoposide 100 mg/M2 on days 1, 2 and 3 as well as Tecentriq (Atezolizumab) 1200 mg IV every 3 weeks with Neulasta support.  First dose June 22, 2018 for disease recurrence.  Status post 5 cycles.  Starting from cycle #2 her dose of carboplatin will be reduced to AUC of 4 and etoposide 80 mg/M2 on days 1, 2 and 3 in addition to the regular dose of Tecentriq.  CURRENT THERAPY: Maintenance treatment with single agent Tecentriq 1200 mg IV every 3 weeks.  Status post 2 cycles.  INTERVAL HISTORY: Debra Kidd 58 y.o. female returns to the clinic today for follow-up visit accompanied by her husband.  The patient is feeling fine today with no concerning complaints except for intermittent back pain.  She denied having any chest pain, shortness of breath, cough or hemoptysis.  She denied having any fever or chills.  She has no nausea, vomiting, diarrhea or constipation.  She has no headache or visual changes.  She continues to tolerate her treatment  with Tecentriq fairly well.  She is here today for evaluation before starting the third cycle of her maintenance treatment.  MEDICAL HISTORY: Past Medical History:  Diagnosis Date  . 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 Blood type, Rh positive   . Cancer ([place])   . Chronic pain syndrome   . Depression 08/07/2016  . Encounter for antineoplastic chemotherapy 07/17/2016  . History of external beam radiation therapy 11/19/16-12/02/16   brain 25 Gy in 10 fractions  . Hyperlipidemia   . Hypertension   . Hypertension 08/07/2016  . Osteoporosis     ALLERGIES:  is allergic to codeine; motrin [ibuprofen]; thiazide-type diuretics; and vicodin [hydrocodone-acetaminophen].  MEDICATIONS:  Current Outpatient Medications  Medication Sig Dispense Refill  . celecoxib (CELEBREX) 200 MG capsule Take 200 mg by mouth daily as needed for mild pain.     . diazepam (VALIUM) 5 MG tablet Take 5 mg by mouth 4 (four) times daily as needed for anxiety.   2  . diphenhydrAMINE (BENADRYL) 25 MG tablet Take 25 mg by mouth every 6 (six) hours as needed for itching or allergies.     Marland Kitchen lidocaine-prilocaine (EMLA) cream Apply 1 application topically as needed. (Patient taking differently: Apply 1 application topically daily as needed (access port). ) 30 g 0  . loperamide (IMODIUM A-D) 2 MG tablet Take 2 mg by mouth 4 (four) times daily as needed for diarrhea or loose stools.    . methocarbamol (ROBAXIN) 500 MG tablet Take 1 tablet (  500 mg total) by mouth 3 (three) times daily. (Patient not taking: Reported on 10/06/2018) 30 tablet 0  . mirtazapine (REMERON) 15 MG tablet Take 1 tablet (15 mg total) by mouth at bedtime. 30 tablet 1  . oxyCODONE-acetaminophen (PERCOCET) 5-325 MG tablet Take 1 tablet by mouth every 6 (six) hours as needed for severe pain. 30 tablet 0  . polyethylene glycol (MIRALAX / GLYCOLAX) packet Take 17 g by mouth daily. (Patient not taking: Reported on 10/06/2018) 14 each 0  . potassium chloride  20 MEQ/15ML (10%) SOLN Take 30 mLs (40 mEq total) by mouth daily. 90 mL 0  . senna-docusate (SENOKOT-S) 8.6-50 MG tablet Take 2 tablets by mouth at bedtime. (Patient not taking: Reported on 10/27/2018) 5 tablet 0  . traMADol (ULTRAM) 50 MG tablet Take 50 mg by mouth every 6 (six) hours as needed for moderate pain.     . Vitamin D, Ergocalciferol, (DRISDOL) 1.25 MG (50000 UT) CAPS capsule Take 1 capsule (50,000 Units total) by mouth every 7 (seven) days. 5 capsule 0   No current facility-administered medications for this visit.     SURGICAL HISTORY:  Past Surgical History:  Procedure Laterality Date  . ABDOMINAL HYSTERECTOMY  1981  . APPENDECTOMY     at age 37  . EYE SURGERY     left  . IR GENERIC HISTORICAL  06/03/2016   IR FLUORO GUIDE PORT INSERTION RIGHT 06/03/2016 [facility]  . IR GENERIC HISTORICAL  06/03/2016   IR US GUIDE VASC ACCESS RIGHT 06/03/2016 [facility]  . SKIN CANCER EXCISION     basal cell carcinoma L side of face    REVIEW OF SYSTEMS:  A comprehensive review of systems was negative except for: Respiratory: positive for cough   PHYSICAL EXAMINATION: General appearance: alert, cooperative and no distress Head: Normocephalic, without obvious abnormality, atraumatic Neck: no adenopathy, no JVD, supple, symmetrical, trachea midline and thyroid not enlarged, symmetric, no tenderness/mass/nodules Lymph nodes: Cervical, supraclavicular, and axillary nodes normal. Resp: clear to auscultation bilaterally Back: symmetric, no curvature. ROM normal. No CVA tenderness. Cardio: regular rate and rhythm, S1, S2 normal, no murmur, click, rub or gallop GI: soft, non-tender; bowel sounds normal; no masses,  no organomegaly Extremities: extremities normal, atraumatic, no cyanosis or edema  ECOG PERFORMANCE STATUS: 1 - Symptomatic but completely ambulatory  Blood pressure 118/89, pulse 93, temperature 98.5 F (36.9 C), temperature source Oral, resp. rate 18, height 5\' 3"  (1.6 m),  weight 130 lb 4.8 oz (59.1 kg), SpO2 100 %.  LABORATORY DATA: Lab Results  Component Value Date   WBC 8.0 11/17/2018   HGB 11.9 (L) 11/17/2018   HCT 35.3 (L) 11/17/2018   MCV 101.4 (H) 11/17/2018   PLT 197 11/17/2018      Chemistry      Component Value Date/Time   NA 141 10/27/2018 0923   NA 141 08/01/2017 0835   K 3.0 (LL) 10/27/2018 0923   K 3.5 08/01/2017 0835   CL 101 10/27/2018 0923   CO2 27 10/27/2018 0923   CO2 25 08/01/2017 0835   BUN 6 10/27/2018 0923   BUN 8.0 08/01/2017 0835   CREATININE 0.77 10/27/2018 0923   CREATININE 0.9 08/01/2017 0835      Component Value Date/Time   CALCIUM 8.7 (L) 10/27/2018 0923   CALCIUM 9.9 08/01/2017 0835   ALKPHOS 143 (H) 10/27/2018 0923   ALKPHOS 142 08/01/2017 0835   AST 21 10/27/2018 0923   AST 15 08/01/2017 0835   ALT 26 10/27/2018  0923   ALT 19 08/01/2017 0835   BILITOT 0.4 10/27/2018 0923   BILITOT 0.35 08/01/2017 0835       RADIOGRAPHIC STUDIES: No results found.  ASSESSMENT AND PLAN:  This is a very pleasant 58 years old white female with extensive stage small cell lung cancer status post systemic chemotherapy with carbo platinum and etoposide for 6 cycles and the patient rotated her treatment well except for chemotherapy-induced anemia and requirement for PRBCs and platelet transfusion. She had significant improvement in her disease with the chemotherapy. She also had prophylactic cranial irradiation. She also underwent stereotactic radiotherapy to progressive right upper lobe pulmonary nodule. The patient has been in observation for close to 2 years. Repeat CT scan of the chest, abdomen and pelvis performed recently showed evidence for disease progression in the abdomen. The patient was started on systemic chemotherapy again with carboplatin, etoposide and Tecentriq status post 6 cycles.  Starting from cycle #5 the patient is treated with maintenance single agent Tecentriq. The patient has been tolerating this  treatment well with no concerning adverse effects. I recommended for the patient to proceed with cycle #7 today as scheduled. I will see her back for follow-up visit in 3 weeks for evaluation before the next cycle of her treatment. She was advised to call immediately if she has any concerning symptoms in the interval. The patient voices understanding of current disease status and treatment options and is in agreement with the current care plan. All questions were answered. The patient knows to call the clinic with any problems, questions or concerns. We can certainly see the patient much sooner if necessary.  Disclaimer: This note was dictated with voice recognition software. Similar sounding words can inadvertently be transcribed and may not be corrected upon review.

## 2018-11-17 NOTE — Progress Notes (Signed)
Nutrition Follow-up:  Patient with lung cancer followed by Dr. Earlie Server.  Patient receiving Tecentriq.    Met with patient during infusion.  Patient reports that smells make her nauseated.  Reports family cooked and ate lasagna last night and she couldn't eat it.  Does not like oral nutrition supplement shakes.  Reports that she has been drinking whole milk.    Medications: remeron  Labs: K 3.0, glucose 132  Anthropometrics:   Weight 130 lb 4.8 oz today decreased from 141 lb 12.8 oz on 12/2  8% weight loss in the last 3 months, significant   NUTRITION DIAGNOSIS: Inadequate oral intake continues   INTERVENTION:  Encouraged taking nausea medications when feel nauseated from smells. Discussed strategies to help reduce smells.  Fact sheet given.     MONITORING, EVALUATION, GOAL: Patient will consume adequate calories and protein to maintain weight during treatment   NEXT VISIT:  April 28 during infusion  Peniel Biel B. Zenia Resides, [place], [place] Registered Dietitian [phone] (pager)

## 2018-11-17 NOTE — Patient Instructions (Signed)
[facility] Discharge Instructions for Patients Receiving Chemotherapy  Today you received the following chemotherapy agents: Atezolizumab [facility])  To help prevent nausea and vomiting after your treatment, we encourage you to take your nausea medication as directed.    If you develop nausea and vomiting that is not controlled by your nausea medication, call the clinic.   BELOW ARE SYMPTOMS THAT SHOULD BE REPORTED IMMEDIATELY:  *FEVER GREATER THAN 100.5 F  *CHILLS WITH OR WITHOUT FEVER  NAUSEA AND VOMITING THAT IS NOT CONTROLLED WITH YOUR NAUSEA MEDICATION  *UNUSUAL SHORTNESS OF BREATH  *UNUSUAL BRUISING OR BLEEDING  TENDERNESS IN MOUTH AND THROAT WITH OR WITHOUT PRESENCE OF ULCERS  *URINARY PROBLEMS  *BOWEL PROBLEMS  UNUSUAL RASH Items with * indicate a potential emergency and should be followed up as soon as possible.  Feel free to call the clinic should you have any questions or concerns. The clinic phone number is [phone].  Please show the [facility] at check-in to the Emergency Department and triage nurse.

## 2018-11-18 ENCOUNTER — Ambulatory Visit: Payer: Medicare Other

## 2018-11-19 ENCOUNTER — Ambulatory Visit: Payer: Medicare Other

## 2018-11-21 ENCOUNTER — Ambulatory Visit: Payer: Medicare Other

## 2018-11-30 MED FILL — POTASSIUM CHLORIDE 20 MEQ/1: 20 MEQ/15ML | 30 days supply | Qty: 900 | Fill #2

## 2018-12-08 ENCOUNTER — Other Ambulatory Visit: Payer: Self-pay

## 2018-12-08 ENCOUNTER — Ambulatory Visit: Payer: Medicare Other | Admitting: Physician Assistant

## 2018-12-08 ENCOUNTER — Inpatient Hospital Stay: Payer: Medicare Other | Attending: Internal Medicine

## 2018-12-08 ENCOUNTER — Other Ambulatory Visit: Payer: Medicare Other

## 2018-12-08 ENCOUNTER — Ambulatory Visit: Payer: Medicare Other

## 2018-12-08 ENCOUNTER — Inpatient Hospital Stay: Payer: Medicare Other

## 2018-12-08 ENCOUNTER — Encounter: Payer: Self-pay | Admitting: Physician Assistant

## 2018-12-08 ENCOUNTER — Inpatient Hospital Stay (HOSPITAL_BASED_OUTPATIENT_CLINIC_OR_DEPARTMENT_OTHER): Payer: Medicare Other | Admitting: Physician Assistant

## 2018-12-08 VITALS — BP 142/82 | HR 88 | Temp 98.0°F | Resp 17 | Ht 63.0 in | Wt 131.9 lb

## 2018-12-08 DIAGNOSIS — D6481 Anemia due to antineoplastic chemotherapy: Secondary | ICD-10-CM | POA: Diagnosis not present

## 2018-12-08 DIAGNOSIS — C3411 Malignant neoplasm of upper lobe, right bronchus or lung: Secondary | ICD-10-CM | POA: Insufficient documentation

## 2018-12-08 DIAGNOSIS — C7889 Secondary malignant neoplasm of other digestive organs: Secondary | ICD-10-CM | POA: Insufficient documentation

## 2018-12-08 DIAGNOSIS — Z923 Personal history of irradiation: Secondary | ICD-10-CM | POA: Diagnosis not present

## 2018-12-08 DIAGNOSIS — Z5112 Encounter for antineoplastic immunotherapy: Secondary | ICD-10-CM | POA: Diagnosis present

## 2018-12-08 DIAGNOSIS — K449 Diaphragmatic hernia without obstruction or gangrene: Secondary | ICD-10-CM | POA: Diagnosis not present

## 2018-12-08 DIAGNOSIS — G894 Chronic pain syndrome: Secondary | ICD-10-CM

## 2018-12-08 DIAGNOSIS — J439 Emphysema, unspecified: Secondary | ICD-10-CM | POA: Insufficient documentation

## 2018-12-08 DIAGNOSIS — M810 Age-related osteoporosis without current pathological fracture: Secondary | ICD-10-CM | POA: Insufficient documentation

## 2018-12-08 DIAGNOSIS — Z79899 Other long term (current) drug therapy: Secondary | ICD-10-CM | POA: Diagnosis not present

## 2018-12-08 DIAGNOSIS — E876 Hypokalemia: Secondary | ICD-10-CM | POA: Insufficient documentation

## 2018-12-08 DIAGNOSIS — C3491 Malignant neoplasm of unspecified part of right bronchus or lung: Secondary | ICD-10-CM

## 2018-12-08 DIAGNOSIS — E785 Hyperlipidemia, unspecified: Secondary | ICD-10-CM

## 2018-12-08 DIAGNOSIS — C778 Secondary and unspecified malignant neoplasm of lymph nodes of multiple regions: Secondary | ICD-10-CM | POA: Diagnosis not present

## 2018-12-08 DIAGNOSIS — I10 Essential (primary) hypertension: Secondary | ICD-10-CM

## 2018-12-08 DIAGNOSIS — Z9221 Personal history of antineoplastic chemotherapy: Secondary | ICD-10-CM

## 2018-12-08 DIAGNOSIS — F329 Major depressive disorder, single episode, unspecified: Secondary | ICD-10-CM

## 2018-12-08 DIAGNOSIS — M549 Dorsalgia, unspecified: Secondary | ICD-10-CM | POA: Diagnosis not present

## 2018-12-08 DIAGNOSIS — Z85828 Personal history of other malignant neoplasm of skin: Secondary | ICD-10-CM

## 2018-12-08 DIAGNOSIS — K5900 Constipation, unspecified: Secondary | ICD-10-CM | POA: Insufficient documentation

## 2018-12-08 DIAGNOSIS — R5382 Chronic fatigue, unspecified: Secondary | ICD-10-CM

## 2018-12-08 LAB — CBC WITH DIFFERENTIAL (CANCER CENTER ONLY)
Abs Immature Granulocytes: 0.03 10*3/uL (ref 0.00–0.07)
Basophils Absolute: 0 10*3/uL (ref 0.0–0.1)
Basophils Relative: 0 %
Eosinophils Absolute: 0 10*3/uL (ref 0.0–0.5)
Eosinophils Relative: 1 %
HCT: 35.4 % — ABNORMAL LOW (ref 36.0–46.0)
Hemoglobin: 12.1 g/dL (ref 12.0–15.0)
Immature Granulocytes: 0 %
Lymphocytes Relative: 24 %
Lymphs Abs: 1.7 10*3/uL (ref 0.7–4.0)
MCH: 37.6 pg — ABNORMAL HIGH (ref 26.0–34.0)
MCHC: 34.2 g/dL (ref 30.0–36.0)
MCV: 109.9 fL — ABNORMAL HIGH (ref 80.0–100.0)
Monocytes Absolute: 0.4 10*3/uL (ref 0.1–1.0)
Monocytes Relative: 6 %
Neutro Abs: 4.9 10*3/uL (ref 1.7–7.7)
Neutrophils Relative %: 69 %
Platelet Count: 231 10*3/uL (ref 150–400)
RBC: 3.22 MIL/uL — ABNORMAL LOW (ref 3.87–5.11)
RDW: 18.5 % — ABNORMAL HIGH (ref 11.5–15.5)
WBC Count: 7.1 10*3/uL (ref 4.0–10.5)
nRBC: 0 % (ref 0.0–0.2)

## 2018-12-08 LAB — CMP (CANCER CENTER ONLY)
ALT: 23 U/L (ref 0–44)
AST: 22 U/L (ref 15–41)
Albumin: 3.5 g/dL (ref 3.5–5.0)
Alkaline Phosphatase: 145 U/L — ABNORMAL HIGH (ref 38–126)
Anion gap: 11 (ref 5–15)
BUN: 6 mg/dL (ref 6–20)
CO2: 24 mmol/L (ref 22–32)
Calcium: 8.7 mg/dL — ABNORMAL LOW (ref 8.9–10.3)
Chloride: 103 mmol/L (ref 98–111)
Creatinine: 0.8 mg/dL (ref 0.44–1.00)
GFR, Est AFR Am: >60 mL/min (ref >60)
GFR, Estimated: >60 mL/min (ref >60)
Glucose, Bld: 103 mg/dL — ABNORMAL HIGH (ref 70–99)
Potassium: 3.7 mmol/L (ref 3.5–5.1)
Sodium: 138 mmol/L (ref 135–145)
Total Bilirubin: 0.5 mg/dL (ref 0.3–1.2)
Total Protein: 7 g/dL (ref 6.5–8.1)

## 2018-12-08 LAB — TSH: TSH: 1.913 u[IU]/mL (ref 0.308–3.960)

## 2018-12-08 MED ORDER — SODIUM CHLORIDE 0.9 % IV SOLN
Freq: Once | INTRAVENOUS | Status: AC
  Administered 2018-12-08: 10:00:00 via INTRAVENOUS
  Filled 2018-12-08: qty 250

## 2018-12-08 MED ORDER — HEPARIN SOD (PORK) LOCK FLUSH 100 UNIT/ML IV SOLN
500.0000 [IU] | Freq: Once | INTRAVENOUS | Status: AC | PRN
  Administered 2018-12-08: 500 [IU]
  Filled 2018-12-08: qty 5

## 2018-12-08 MED ORDER — SODIUM CHLORIDE 0.9% FLUSH
10.0000 mL | INTRAVENOUS | Status: DC | PRN
  Administered 2018-12-08: 09:00:00 10 mL
  Filled 2018-12-08: qty 10

## 2018-12-08 MED ORDER — SODIUM CHLORIDE 0.9 % IV SOLN
1200.0000 mg | Freq: Once | INTRAVENOUS | Status: AC
  Administered 2018-12-08: 1200 mg via INTRAVENOUS
  Filled 2018-12-08: qty 20

## 2018-12-08 MED ORDER — SODIUM CHLORIDE 0.9% FLUSH
10.0000 mL | INTRAVENOUS | Status: DC | PRN
  Administered 2018-12-08: 11:00:00 10 mL
  Filled 2018-12-08: qty 10

## 2018-12-08 NOTE — Progress Notes (Signed)
[facility] OFFICE PROGRESS NOTE  [name], MD [address] [place] [address]  DIAGNOSIS: Extensive stage (T2a, N3, [name], large right anterior mediastinal and supraclavicular lymphadenopathy as well as pancreatic and splenic metastasis diagnosed in September 2017.  The patient had disease progression in October 2019.  PRIOR THERAPY:  1) Systemic chemotherapy was carboplatin for AUC of 5 on day 1 and etoposide 100 MG/M2 on days 1, 2 and 3 with Neulasta support. Status post 6 cycles with significant response of her disease. 2) Prophylactic cranial irradiation under the care of Dr. Sondra Come on 12/02/2016. 3) stereotactic radiotherapy to the recurrent right upper lobe pulmonary nodule under the care of Dr. Sondra Come completed November 10, 2017. 4) Retreatment with systemic chemotherapy with carboplatin for AUC of 5 on day 1 and etoposide 100 mg/M2 on days 1, 2 and 3 as well as Tecentriq (Atezolizumab) 1200 mg IV every 3 weeks with Neulasta support.  First dose June 22, 2018 for disease recurrence.  Status post 5 cycles.  Starting from cycle #2 her dose of carboplatin will be reduced to AUC of 4 and etoposide 80 mg/M2 on days 1, 2 and 3 in addition to the regular dose of Tecentriq.  CURRENT THERAPY: Maintenance treatment with single agent Tecentriq 1200 mg IV every 3 weeks.  Status post 3 cycles.  INTERVAL HISTORY: Debra Kidd 58 y.o. female returns to the clinic for a follow-up visit.  The patient is feeling well today without any concerning complaints except for continued intermittent back pain.  She localizes her pain between her shoulder blades. She has a history of compression fractures in this region. The patient takes Robaxin and Percocet which successfully alleviates her pain.  Otherwise the patient continues to tolerate treatment with Tecentriq fairly well without any adverse effects except for some mild itching  and an occasional small patch of a rash. The patient takes benadryl which helps her itching.  She denies any fever, chills, night sweats, or  weight loss. She recently met with our registered dietician which she states was helpful. It appears her weight has been stable over the last few visits. She denies any chest pain, shortness of breath, cough, or hemoptysis.  She denies any nausea, vomiting, or diarrhea. She occasionally experiences constipated from her pain medication. She has miralax at home for constipation. She denies any headache or visual changes.  She is taking potassium supplements for hypokalemia which was found on routine labs. She has about half of her dose still remaining. She is here today for evaluation before starting cycle #4 of maintenance Tecentriq.   MEDICAL HISTORY: Past Medical History:  Diagnosis Date  . 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 Blood type, Rh positive   . Cancer ([place])   . Chronic pain syndrome   . Depression 08/07/2016  . Encounter for antineoplastic chemotherapy 07/17/2016  . History of external beam radiation therapy 11/19/16-12/02/16   brain 25 Gy in 10 fractions  . Hyperlipidemia   . Hypertension   . Hypertension 08/07/2016  . Osteoporosis     ALLERGIES:  is allergic to codeine; motrin [ibuprofen]; thiazide-type diuretics; and vicodin [hydrocodone-acetaminophen].  MEDICATIONS:  Current Outpatient Medications  Medication Sig Dispense Refill  . celecoxib (CELEBREX) 200 MG capsule Take 200 mg by mouth daily as needed for mild pain.     . diazepam (VALIUM) 5 MG tablet Take 5  mg by mouth 4 (four) times daily as needed for anxiety.   2  . diphenhydrAMINE (BENADRYL) 25 MG tablet Take 25 mg by mouth every 6 (six) hours as needed for itching or allergies.     Marland Kitchen lidocaine-prilocaine (EMLA) cream Apply 1 application topically as needed. (Patient taking differently: Apply 1 application topically daily as needed (access port). ) 30 g 0  .  loperamide (IMODIUM A-D) 2 MG tablet Take 2 mg by mouth 4 (four) times daily as needed for diarrhea or loose stools.    . methocarbamol (ROBAXIN) 500 MG tablet Take 1 tablet (500 mg total) by mouth 3 (three) times daily. (Patient not taking: Reported on 10/06/2018) 30 tablet 0  . mirtazapine (REMERON) 15 MG tablet Take 1 tablet (15 mg total) by mouth at bedtime. 30 tablet 1  . oxyCODONE-acetaminophen (PERCOCET) 5-325 MG tablet Take 1 tablet by mouth every 6 (six) hours as needed for severe pain. 30 tablet 0  . polyethylene glycol (MIRALAX / GLYCOLAX) packet Take 17 g by mouth daily. (Patient not taking: Reported on 10/06/2018) 14 each 0  . potassium chloride 20 MEQ/15ML (10%) SOLN Take 30 mLs (40 mEq total) by mouth daily. 90 mL 0  . senna-docusate (SENOKOT-S) 8.6-50 MG tablet Take 2 tablets by mouth at bedtime. (Patient not taking: Reported on 10/27/2018) 5 tablet 0  . traMADol (ULTRAM) 50 MG tablet Take 50 mg by mouth every 6 (six) hours as needed for moderate pain.     . Vitamin D, Ergocalciferol, (DRISDOL) 1.25 MG (50000 UT) CAPS capsule Take 1 capsule (50,000 Units total) by mouth every 7 (seven) days. 5 capsule 0   No current facility-administered medications for this visit.    Facility-Administered Medications Ordered in Other Visits  Medication Dose Route Frequency Provider Last Rate Last Dose  . atezolizumab (TECENTRIQ) 1,200 mg in sodium chloride 0.9 % 250 mL chemo infusion  1,200 mg Intravenous Once [name], MD      . heparin lock flush 100 unit/mL  500 Units Intracatheter Once PRN [name], MD      . sodium chloride flush (NS) 0.9 % injection 10 mL  10 mL Intracatheter PRN [name], MD        SURGICAL HISTORY:  Past Surgical History:  Procedure Laterality Date  . ABDOMINAL HYSTERECTOMY  1981  . APPENDECTOMY     at age 42  . EYE SURGERY     left  . IR GENERIC HISTORICAL  06/03/2016   IR FLUORO GUIDE PORT INSERTION RIGHT 06/03/2016 [facility]  . IR GENERIC  HISTORICAL  06/03/2016   IR US GUIDE VASC ACCESS RIGHT 06/03/2016 [facility]  . SKIN CANCER EXCISION     basal cell carcinoma L side of face    REVIEW OF SYSTEMS:   Review of Systems  Constitutional: Positive for diminished appetite. Negative for chills, fatigue, [name], nosebleeds, sore throat and trouble swallowing.   Eyes: Negative for eye problems and icterus.  Respiratory: Negative for cough, hemoptysis, shortness of breath and wheezing.   Cardiovascular: Negative for chest pain and leg swelling.  Gastrointestinal: Positive for constipation. Negative for abdominal pain, diarrhea, nausea and vomiting.  Genitourinary: Negative for bladder incontinence, difficulty urinating, dysuria, frequency and hematuria.   Musculoskeletal: Positive for back pain. Negative for gait problem, neck pain and neck stiffness.  Skin: Positive for small patches of rashes and itchiness.  Neurological: Negative for dizziness, extremity weakness, gait  problem, headaches, [name], depression and sleep disturbance. The patient is not nervous/anxious.     PHYSICAL EXAMINATION:  Blood pressure (!) 142/82, pulse 88, temperature 98 F (36.7 C), temperature source Oral, resp. rate 17, height '5\' 3"'  (1.6 m), weight 131 lb 14.4 oz (59.8 kg), SpO2 100 %.  ECOG PERFORMANCE STATUS: 1 - Symptomatic but completely ambulatory  Physical Exam  Constitutional: Oriented to person, place, and time and well-developed, well-nourished, and in no distress. No distress.  HENT:  Head: Normocephalic and atraumatic.  Mouth/Throat: Oropharynx is clear and moist. No oropharyngeal exudate.  Eyes: Conjunctivae are normal. Right eye exhibits no discharge. Left eye exhibits no discharge. No scleral icterus.  Neck: Normal range of motion.  Neck supple.  Cardiovascular: Normal rate, regular rhythm, normal heart sounds and intact distal pulses.   Pulmonary/Chest: Effort normal and breath sounds normal. No respiratory distress. No wheezes. No rales.  Abdominal: Soft. Bowel sounds are normal. Exhibits no distension and no mass. There is no tenderness.  Musculoskeletal: Normal range of motion. Exhibits no edema.  Lymphadenopathy:    No cervical adenopathy.  Neurological: Alert and oriented to person, place, and time. Exhibits normal muscle tone. Gait normal. Coordination normal.  Skin: Skin is warm and dry. No rash noted. Not diaphoretic. No erythema. No pallor.  Psychiatric: Mood, memory and judgment normal.  Vitals reviewed.  LABORATORY DATA: Lab Results  Component Value Date   WBC 7.1 12/08/2018   HGB 12.1 12/08/2018   HCT 35.4 (L) 12/08/2018   MCV 109.9 (H) 12/08/2018   PLT 231 12/08/2018      Chemistry      Component Value Date/Time   NA 138 12/08/2018 0845   NA 141 08/01/2017 0835   K 3.7 12/08/2018 0845   K 3.5 08/01/2017 0835   CL 103 12/08/2018 0845   CO2 24 12/08/2018 0845   CO2 25 08/01/2017 0835   BUN 6 12/08/2018 0845   BUN 8.0 08/01/2017 0835   CREATININE 0.80 12/08/2018 0845   CREATININE 0.9 08/01/2017 0835      Component Value Date/Time   CALCIUM 8.7 (L) 12/08/2018 0845   CALCIUM 9.9 08/01/2017 0835   ALKPHOS 145 (H) 12/08/2018 0845   ALKPHOS 142 08/01/2017 0835   AST 22 12/08/2018 0845   AST 15 08/01/2017 0835   ALT 23 12/08/2018 0845   ALT 19 08/01/2017 0835   BILITOT 0.5 12/08/2018 0845   BILITOT 0.35 08/01/2017 0835       RADIOGRAPHIC STUDIES:  No results found.   ASSESSMENT/PLAN:  This is a very pleasant 58 year old Caucasian female with extensive stage small cell lung cancer of the right upper lobe.  She was treated with systemic chemotherapy with carboplatin and etoposide for 6 cycles. The patient tolerated her treatment well except for chemotherapy-induced anemia, which  required PRBCs and a platelet transfusion. She had significant improvement in her disease with the chemotherapy. She also had prophylactic cranial irradiation. She also underwent stereotactic radiotherapy to progressive right upper lobe pulmonary nodule. The patient had been on observation for 2 years until she showed evidence of disease progression.   She was then started again on systemic chemotherapy with carboplatin, etoposide, and Tecentriq.  She is status post 7 cycles. Starting from cycle #5, the patient is been undergoing maintenance immunotherapy with Tecentriq.  She is status post 3 cycles of maintenance Tecentriq.  The patient was seen with Dr. Almond Lint today.  She is tolerating treatment well except for mild itching.  Labs were reviewed with the patient.  I recommend she proceed with cycle #4 of maintenance Tecentriq today as scheduled.  I have arranged for a restaging CT scan of the chest, abdomen, and pelvis to be performed prior to her next visit. I will see her back in 3 weeks for evaluation and to review her scan results before starting cycle #5 of maintenance Tecentriq.   For her itching, she will continue to take Benadryl.   The patient's potassium was within normal limits today at 3.7. We to monitor her potassium on routine labs.  She will continue taking her Robaxin and Percocet for her back pain.  She has a history of compression fractures on T5, T6, and T8; however, we will see if there is any metastatic lesions in this area on her upcoming scan. We will refer her to radiation if there are any concerning lesions.    For the patient's constipation, discussed with the patient that she may take a daily over-the-counter stool softener to prevent constipation secondary to her pain medication.   The patient was advised to call immediately if she has any concerning symptoms in the interval. The patient voices understanding of current disease status and treatment options and is in  agreement with the current care plan. All questions were answered. The patient knows to call the clinic with any problems, questions or concerns. We can certainly see the patient much sooner if necessary    Orders Placed This Encounter  Procedures  . CT Chest W Contrast    Standing Status:   Future    Standing Expiration Date:   12/08/2019    Order Specific Question:   ** REASON FOR EXAM (FREE TEXT)    Answer:   Restaging Lung Cancer    Order Specific Question:   If indicated for the ordered procedure, I authorize the administration of contrast media per Radiology protocol    Answer:   Yes    Order Specific Question:   Is patient pregnant?    Answer:   No    Order Specific Question:   Preferred imaging location?    Answer:   [facility]    Order Specific Question:   Radiology Contrast Protocol - do NOT remove file path    Answer:   [email or web address]  . CT Abdomen Pelvis W Contrast    Standing Status:   Future    Standing Expiration Date:   12/08/2019    Order Specific Question:   ** REASON FOR EXAM (FREE TEXT)    Answer:   Restaging Lung Cancer    Order Specific Question:   If indicated for the ordered procedure, I authorize the administration of contrast media per Radiology protocol    Answer:   Yes    Order Specific Question:   Is patient pregnant?    Answer:   No    Order Specific Question:   Preferred imaging location?    Answer:   [facility]    Order Specific Question:   Is Oral Contrast requested for this exam?    Answer:   Yes, Per Radiology protocol    Order Specific Question:   Radiology Contrast Protocol - do NOT remove file path    Answer:   [email or web address]     Debra Kulaga L Marcelyn Ruppe, PA-C 12/08/18  ADDENDUM: Hematology/Oncology Attending: I had a face-to-face encounter with the patient today.  I recommended her care plan.  This is  a very pleasant 58 years old white female with recurrent and  metastatic small cell carcinoma.  She was started again on systemic chemotherapy with carboplatin, etoposide and Tecentriq and she is currently on maintenance treatment with Tecentriq status post 3 cycles.  The patient continues to tolerate this treatment well with no concerning adverse effects. I recommended for her to proceed with cycle #4 today. I will see her back for follow-up visit in 3 weeks for evaluation after repeating CT scan of the chest, abdomen and pelvis for restaging of her disease. The patient was advised to call immediately if she has any concerning symptoms in the interval.  Disclaimer: This note was dictated with voice recognition software. Similar sounding words can inadvertently be transcribed and may be missed upon review. Eilleen Kempf, MD 12/08/18

## 2018-12-08 NOTE — Patient Instructions (Signed)
Coronavirus (COVID-19) Are you at risk?  Are you at risk for the Coronavirus (COVID-19)?  To be considered HIGH RISK for Coronavirus (COVID-19), you have to meet the following criteria:  . Traveled to [place], [place], [place], [place] or [place]; or in the [place] to [place], [place], [place], or [place]; and have fever, cough, and shortness of breath within the last 2 weeks of travel OR . Been in close contact with a person diagnosed with COVID-19 within the last 2 weeks and have fever, cough, and shortness of breath . IF YOU DO NOT MEET THESE CRITERIA, YOU ARE CONSIDERED LOW RISK FOR COVID-19.  What to do if you are HIGH RISK for COVID-19?  . If you are having a medical emergency, call 911. . Seek medical care right away. Before you go to a doctor's office, urgent care or emergency department, call ahead and tell them about your recent travel, contact with someone diagnosed with COVID-19, and your symptoms. You should receive instructions from your physician's office regarding next steps of care.  . When you arrive at healthcare provider, tell the healthcare staff immediately you have returned from visiting [place], [place], [place], [place] or [place]; or traveled in the [place] to [place], [place], [place], or [place]; in the last two weeks or you have been in close contact with a person diagnosed with COVID-19 in the last 2 weeks.   . Tell the health care staff about your symptoms: fever, cough and shortness of breath. . After you have been seen by a medical provider, you will be either: o Tested for (COVID-19) and discharged home on quarantine except to seek medical care if symptoms worsen, and asked to  - Stay home and avoid contact with others until you get your results (4-5 days)  - Avoid travel on public transportation if possible (such as bus, train, or airplane) or o Sent to the Emergency Department by EMS for evaluation, COVID-19 testing, and possible  admission depending on your condition and test results.  What to do if you are LOW RISK for COVID-19?  Reduce your risk of any infection by using the same precautions used for avoiding the common cold or flu:  . Wash your hands often with soap and warm water for at least 20 seconds.  If soap and water are not readily available, use an alcohol-based hand sanitizer with at least 60% alcohol.  . If coughing or sneezing, cover your mouth and nose by coughing or sneezing into the elbow areas of your shirt or coat, into a tissue or into your sleeve (not your hands). . Avoid shaking hands with others and consider head nods or verbal greetings only. . Avoid touching your eyes, nose, or mouth with unwashed hands.  . Avoid close contact with people who are sick. . Avoid places or events with large numbers of people in one location, like concerts or sporting events. . Carefully consider travel plans you have or are making. . If you are planning any travel outside or inside the [place], visit the CDC's Travelers' Health webpage for the latest health notices. . If you have some symptoms but not all symptoms, continue to monitor at home and seek medical attention if your symptoms worsen. . If you are having a medical emergency, call 911.   ADDITIONAL HEALTHCARE OPTIONS FOR PATIENTS  [facility] / e-Visit: [email or web address]         [facility]: [phone]  [place]   [facility]: [facility]: [facility] Discharge Instructions for Patients Receiving Chemotherapy  Today you received the following chemotherapy agents: Atezolizumab Gildardo Pounds)  To help prevent nausea and vomiting after your treatment, we encourage you to take your nausea medication as directed.    If you develop nausea and vomiting that is not controlled by your nausea medication, call the  clinic.   BELOW ARE SYMPTOMS THAT SHOULD BE REPORTED IMMEDIATELY:  *FEVER GREATER THAN 100.5 F  *CHILLS WITH OR WITHOUT FEVER  NAUSEA AND VOMITING THAT IS NOT CONTROLLED WITH YOUR NAUSEA MEDICATION  *UNUSUAL SHORTNESS OF BREATH  *UNUSUAL BRUISING OR BLEEDING  TENDERNESS IN MOUTH AND THROAT WITH OR WITHOUT PRESENCE OF ULCERS  *URINARY PROBLEMS  *BOWEL PROBLEMS  UNUSUAL RASH Items with * indicate a potential emergency and should be followed up as soon as possible.  Feel free to call the clinic should you have any questions or concerns. The clinic phone number is [phone].  Please show the [facility] at check-in to the Emergency Department and triage nurse.

## 2018-12-18 ENCOUNTER — Telehealth: Payer: Self-pay | Admitting: *Deleted

## 2018-12-18 ENCOUNTER — Other Ambulatory Visit: Payer: Self-pay | Admitting: Physician Assistant

## 2018-12-18 DIAGNOSIS — C3491 Malignant neoplasm of unspecified part of right bronchus or lung: Secondary | ICD-10-CM

## 2018-12-18 MED ORDER — PROCHLORPERAZINE MALEATE 10 MG PO TABS: 10.0000 mg | ORAL_TABLET | Freq: Four times a day (QID) | ORAL | 0 refills | Status: DC | PRN

## 2018-12-18 MED FILL — PROCHLORPERAZINE 10 MG TAB: 10 | 8 days supply | Qty: 30 | Fill #0

## 2018-12-18 NOTE — Telephone Encounter (Signed)
Received call from patient requesting refill on Comapzine.  This is not currently on medlist but on history-she had it last filled in 12/19. Can she have again?

## 2018-12-25 ENCOUNTER — Ambulatory Visit (HOSPITAL_BASED_OUTPATIENT_CLINIC_OR_DEPARTMENT_OTHER)
Admission: RE | Admit: 2018-12-25 | Discharge: 2018-12-25 | Disposition: A | Discharge status: Home / Self Care | Payer: Medicare Other | Source: Ambulatory Visit | Attending: Physician Assistant | Admitting: Physician Assistant

## 2018-12-25 ENCOUNTER — Other Ambulatory Visit: Payer: Self-pay

## 2018-12-25 DIAGNOSIS — C3491 Malignant neoplasm of unspecified part of right bronchus or lung: Secondary | ICD-10-CM | POA: Insufficient documentation

## 2018-12-25 MED ORDER — IOHEXOL 300 MG/ML  SOLN
100.0000 mL | Freq: Once | INTRAMUSCULAR | Status: AC | PRN
  Administered 2018-12-25: 100 mL via INTRAVENOUS

## 2018-12-29 ENCOUNTER — Inpatient Hospital Stay: Payer: Medicare Other

## 2018-12-29 ENCOUNTER — Other Ambulatory Visit: Payer: Self-pay

## 2018-12-29 ENCOUNTER — Encounter: Payer: Self-pay | Admitting: Internal Medicine

## 2018-12-29 ENCOUNTER — Inpatient Hospital Stay (HOSPITAL_BASED_OUTPATIENT_CLINIC_OR_DEPARTMENT_OTHER): Payer: Medicare Other | Admitting: Internal Medicine

## 2018-12-29 VITALS — BP 134/95 | HR 98 | Temp 98.3°F | Resp 18 | Ht 63.0 in | Wt 130.9 lb

## 2018-12-29 DIAGNOSIS — C778 Secondary and unspecified malignant neoplasm of lymph nodes of multiple regions: Secondary | ICD-10-CM | POA: Diagnosis not present

## 2018-12-29 DIAGNOSIS — E876 Hypokalemia: Secondary | ICD-10-CM

## 2018-12-29 DIAGNOSIS — Z85828 Personal history of other malignant neoplasm of skin: Secondary | ICD-10-CM

## 2018-12-29 DIAGNOSIS — K5900 Constipation, unspecified: Secondary | ICD-10-CM

## 2018-12-29 DIAGNOSIS — C3491 Malignant neoplasm of unspecified part of right bronchus or lung: Secondary | ICD-10-CM

## 2018-12-29 DIAGNOSIS — G894 Chronic pain syndrome: Secondary | ICD-10-CM

## 2018-12-29 DIAGNOSIS — R5382 Chronic fatigue, unspecified: Secondary | ICD-10-CM

## 2018-12-29 DIAGNOSIS — Z5112 Encounter for antineoplastic immunotherapy: Secondary | ICD-10-CM

## 2018-12-29 DIAGNOSIS — C7889 Secondary malignant neoplasm of other digestive organs: Secondary | ICD-10-CM

## 2018-12-29 DIAGNOSIS — C3411 Malignant neoplasm of upper lobe, right bronchus or lung: Secondary | ICD-10-CM | POA: Diagnosis not present

## 2018-12-29 DIAGNOSIS — J439 Emphysema, unspecified: Secondary | ICD-10-CM

## 2018-12-29 DIAGNOSIS — Z9221 Personal history of antineoplastic chemotherapy: Secondary | ICD-10-CM

## 2018-12-29 DIAGNOSIS — E785 Hyperlipidemia, unspecified: Secondary | ICD-10-CM

## 2018-12-29 DIAGNOSIS — K449 Diaphragmatic hernia without obstruction or gangrene: Secondary | ICD-10-CM

## 2018-12-29 DIAGNOSIS — F329 Major depressive disorder, single episode, unspecified: Secondary | ICD-10-CM

## 2018-12-29 DIAGNOSIS — Z923 Personal history of irradiation: Secondary | ICD-10-CM

## 2018-12-29 DIAGNOSIS — Z79899 Other long term (current) drug therapy: Secondary | ICD-10-CM

## 2018-12-29 DIAGNOSIS — M549 Dorsalgia, unspecified: Secondary | ICD-10-CM

## 2018-12-29 DIAGNOSIS — D6481 Anemia due to antineoplastic chemotherapy: Secondary | ICD-10-CM

## 2018-12-29 DIAGNOSIS — M810 Age-related osteoporosis without current pathological fracture: Secondary | ICD-10-CM

## 2018-12-29 DIAGNOSIS — I10 Essential (primary) hypertension: Secondary | ICD-10-CM

## 2018-12-29 LAB — CMP (CANCER CENTER ONLY)
ALT: 16 U/L (ref 0–44)
AST: 17 U/L (ref 15–41)
Albumin: 3.7 g/dL (ref 3.5–5.0)
Alkaline Phosphatase: 152 U/L — ABNORMAL HIGH (ref 38–126)
Anion gap: 12 (ref 5–15)
BUN: 6 mg/dL (ref 6–20)
CO2: 23 mmol/L (ref 22–32)
Calcium: 9.1 mg/dL (ref 8.9–10.3)
Chloride: 105 mmol/L (ref 98–111)
Creatinine: 0.81 mg/dL (ref 0.44–1.00)
GFR, Est AFR Am: >60 mL/min (ref >60)
GFR, Estimated: >60 mL/min (ref >60)
Glucose, Bld: 92 mg/dL (ref 70–99)
Potassium: 3.7 mmol/L (ref 3.5–5.1)
Sodium: 140 mmol/L (ref 135–145)
Total Bilirubin: 0.3 mg/dL (ref 0.3–1.2)
Total Protein: 7.5 g/dL (ref 6.5–8.1)

## 2018-12-29 LAB — CBC WITH DIFFERENTIAL (CANCER CENTER ONLY)
Abs Immature Granulocytes: 0.02 10*3/uL (ref 0.00–0.07)
Basophils Absolute: 0 10*3/uL (ref 0.0–0.1)
Basophils Relative: 0 %
Eosinophils Absolute: 0.1 10*3/uL (ref 0.0–0.5)
Eosinophils Relative: 1 %
HCT: 39.4 % (ref 36.0–46.0)
Hemoglobin: 13.5 g/dL (ref 12.0–15.0)
Immature Granulocytes: 0 %
Lymphocytes Relative: 23 %
Lymphs Abs: 1.7 10*3/uL (ref 0.7–4.0)
MCH: 39.2 pg — ABNORMAL HIGH (ref 26.0–34.0)
MCHC: 34.3 g/dL (ref 30.0–36.0)
MCV: 114.5 fL — ABNORMAL HIGH (ref 80.0–100.0)
Monocytes Absolute: 0.4 10*3/uL (ref 0.1–1.0)
Monocytes Relative: 6 %
Neutro Abs: 5.2 10*3/uL (ref 1.7–7.7)
Neutrophils Relative %: 70 %
Platelet Count: 252 10*3/uL (ref 150–400)
RBC: 3.44 MIL/uL — ABNORMAL LOW (ref 3.87–5.11)
RDW: 16.7 % — ABNORMAL HIGH (ref 11.5–15.5)
WBC Count: 7.5 10*3/uL (ref 4.0–10.5)
nRBC: 0 % (ref 0.0–0.2)

## 2018-12-29 LAB — TSH: TSH: 1.516 u[IU]/mL (ref 0.308–3.960)

## 2018-12-29 MED ORDER — HEPARIN SOD (PORK) LOCK FLUSH 100 UNIT/ML IV SOLN
500.0000 [IU] | Freq: Once | INTRAVENOUS | Status: AC | PRN
  Administered 2018-12-29: 500 [IU]
  Filled 2018-12-29: qty 5

## 2018-12-29 MED ORDER — SODIUM CHLORIDE 0.9% FLUSH
10.0000 mL | INTRAVENOUS | Status: DC | PRN
  Administered 2018-12-29: 10 mL
  Filled 2018-12-29: qty 10

## 2018-12-29 MED ORDER — SODIUM CHLORIDE 0.9 % IV SOLN
1200.0000 mg | Freq: Once | INTRAVENOUS | Status: AC
  Administered 2018-12-29: 1200 mg via INTRAVENOUS
  Filled 2018-12-29: qty 20

## 2018-12-29 MED ORDER — OXYCODONE-ACETAMINOPHEN 5-325 MG PO TABS: 1.0000 | ORAL_TABLET | Freq: Four times a day (QID) | ORAL | 0 refills | Status: DC | PRN

## 2018-12-29 MED ORDER — SODIUM CHLORIDE 0.9 % IV SOLN
Freq: Once | INTRAVENOUS | Status: AC
  Administered 2018-12-29: 11:00:00 via INTRAVENOUS
  Filled 2018-12-29: qty 250

## 2018-12-29 MED FILL — OXYCODONE-ACETAMINOPHEN 5-3: 5-325 | 8 days supply | Qty: 30 | Fill #0

## 2018-12-29 NOTE — Patient Instructions (Signed)

## 2018-12-29 NOTE — Patient Instructions (Signed)
[facility] Discharge Instructions for Patients Receiving Chemotherapy  Today you received the following chemotherapy agents: Atezolizumab [facility])  To help prevent nausea and vomiting after your treatment, we encourage you to take your nausea medication as directed.    If you develop nausea and vomiting that is not controlled by your nausea medication, call the clinic.   BELOW ARE SYMPTOMS THAT SHOULD BE REPORTED IMMEDIATELY:  *FEVER GREATER THAN 100.5 F  *CHILLS WITH OR WITHOUT FEVER  NAUSEA AND VOMITING THAT IS NOT CONTROLLED WITH YOUR NAUSEA MEDICATION  *UNUSUAL SHORTNESS OF BREATH  *UNUSUAL BRUISING OR BLEEDING  TENDERNESS IN MOUTH AND THROAT WITH OR WITHOUT PRESENCE OF ULCERS  *URINARY PROBLEMS  *BOWEL PROBLEMS  UNUSUAL RASH Items with * indicate a potential emergency and should be followed up as soon as possible.  Feel free to call the clinic should you have any questions or concerns. The clinic phone number is [phone].  Please show the [facility] at check-in to the Emergency Department and triage nurse.

## 2018-12-29 NOTE — Progress Notes (Signed)
[facility] Telephone:([phone]   [phone]  OFFICE PROGRESS NOTE  [name], MD [address] [place] [address]  DIAGNOSIS: Extensive stage (T2a, N3, [name], large right anterior mediastinal and supraclavicular lymphadenopathy as well as pancreatic and splenic metastasis diagnosed in September 2017.  The patient had disease progression in October 2019.  PRIOR THERAPY:  1) Systemic chemotherapy was carboplatin for AUC of 5 on day 1 and etoposide 100 MG/M2 on days 1, 2 and 3 with Neulasta support. Status post 6 cycles with significant response of her disease. 2) Prophylactic cranial irradiation under the care of Dr. Sondra Come on 12/02/2016. 3) stereotactic radiotherapy to the recurrent right upper lobe pulmonary nodule under the care of Dr. Sondra Come completed November 10, 2017. 4) Retreatment with systemic chemotherapy with carboplatin for AUC of 5 on day 1 and etoposide 100 mg/M2 on days 1, 2 and 3 as well as Tecentriq (Atezolizumab) 1200 mg IV every 3 weeks with Neulasta support.  First dose June 22, 2018 for disease recurrence.  Status post 5 cycles.  Starting from cycle #2 her dose of carboplatin will be reduced to AUC of 4 and etoposide 80 mg/M2 on days 1, 2 and 3 in addition to the regular dose of Tecentriq.  CURRENT THERAPY: Maintenance treatment with single agent Tecentriq 1200 mg IV every 3 weeks.  Status post 3 cycles.  INTERVAL HISTORY: Debra Kidd 58 y.o. female returns to the clinic today for follow-up visit.  The patient is here today with no concerning complaints except for the chronic back pain and she is requesting refill of her pain medication.  She denied having any current chest pain, shortness of breath, cough.  She has no nausea, vomiting, diarrhea or constipation.  She has no headache or visual changes.  She denied having any significant weight loss or night sweats.  She  continues to tolerate her treatment with Tecentriq fairly well.  She had repeat CT scan of the chest, abdomen pelvis performed recently and she is here for evaluation and discussion.  MEDICAL HISTORY: Past Medical History:  Diagnosis Date   Basal cell carcinoma of cheek    L side of face   Blood type, Rh positive    Cancer (HCC)    Chronic pain syndrome    Depression 08/07/2016   Encounter for antineoplastic chemotherapy 07/17/2016   History of external beam radiation therapy 11/19/16-12/02/16   brain 25 Gy in 10 fractions   Hyperlipidemia    Hypertension    Hypertension 08/07/2016   Osteoporosis     ALLERGIES:  is allergic to codeine; motrin [ibuprofen]; thiazide-type diuretics; and vicodin [hydrocodone-acetaminophen].  MEDICATIONS:  Current Outpatient Medications  Medication Sig Dispense Refill   celecoxib (CELEBREX) 200 MG capsule Take 200 mg by mouth daily as needed for mild pain.      diazepam (VALIUM) 5 MG tablet Take 5 mg by mouth 4 (four) times daily as needed for anxiety.   2   diphenhydrAMINE (BENADRYL) 25 MG tablet Take 25 mg by mouth every 6 (six) hours as needed for itching or allergies.      lidocaine-prilocaine (EMLA) cream Apply 1 application topically as needed. (Patient taking differently: Apply 1 application topically daily as needed (access port). ) 30 g 0   loperamide (IMODIUM A-D) 2 MG tablet Take 2 mg by mouth 4 (four) times daily as needed for diarrhea or loose stools.  methocarbamol (ROBAXIN) 500 MG tablet Take 1 tablet (500 mg total) by mouth 3 (three) times daily. (Patient not taking: Reported on 10/06/2018) 30 tablet 0   mirtazapine (REMERON) 15 MG tablet Take 1 tablet (15 mg total) by mouth at bedtime. 30 tablet 1   oxyCODONE-acetaminophen (PERCOCET) 5-325 MG tablet Take 1 tablet by mouth every 6 (six) hours as needed for severe pain. 30 tablet 0   polyethylene glycol (MIRALAX / GLYCOLAX) packet Take 17 g by mouth daily. (Patient not  taking: Reported on 10/06/2018) 14 each 0   potassium chloride 20 MEQ/15ML (10%) SOLN Take 30 mLs (40 mEq total) by mouth daily. 90 mL 0   prochlorperazine (COMPAZINE) 10 MG tablet Take 1 tablet (10 mg total) by mouth every 6 (six) hours as needed for nausea or vomiting. 30 tablet 0   senna-docusate (SENOKOT-S) 8.6-50 MG tablet Take 2 tablets by mouth at bedtime. (Patient not taking: Reported on 10/27/2018) 5 tablet 0   traMADol (ULTRAM) 50 MG tablet Take 50 mg by mouth every 6 (six) hours as needed for moderate pain.      Vitamin D, Ergocalciferol, (DRISDOL) 1.25 MG (50000 UT) CAPS capsule Take 1 capsule (50,000 Units total) by mouth every 7 (seven) days. 5 capsule 0   No current facility-administered medications for this visit.     SURGICAL HISTORY:  Past Surgical History:  Procedure Laterality Date   ABDOMINAL HYSTERECTOMY  1981   APPENDECTOMY     at age 40   EYE SURGERY     left   IR GENERIC HISTORICAL  06/03/2016   IR FLUORO GUIDE PORT INSERTION RIGHT 06/03/2016 [facility]   IR GENERIC HISTORICAL  06/03/2016   IR US GUIDE VASC ACCESS RIGHT 06/03/2016 [facility]   SKIN CANCER EXCISION     basal cell carcinoma L side of face    REVIEW OF SYSTEMS:  Constitutional: positive for fatigue Eyes: negative Ears, nose, mouth, throat, and face: negative Respiratory: negative Cardiovascular: negative Gastrointestinal: negative Genitourinary:negative Integument/breast: negative Hematologic/lymphatic: negative Musculoskeletal:positive for back pain Neurological: negative Behavioral/Psych: negative Endocrine: negative Allergic/Immunologic: negative   PHYSICAL EXAMINATION: General appearance: alert, cooperative and no distress Head: Normocephalic, without obvious abnormality, atraumatic Neck: no adenopathy, no JVD, supple, symmetrical, trachea midline and thyroid not enlarged, symmetric, no tenderness/mass/nodules Lymph nodes: Cervical, supraclavicular, and axillary nodes  normal. Resp: clear to auscultation bilaterally Back: symmetric, no curvature. ROM normal. No CVA tenderness. Cardio: regular rate and rhythm, S1, S2 normal, no murmur, click, rub or gallop GI: soft, non-tender; bowel sounds normal; no masses,  no organomegaly Extremities: extremities normal, atraumatic, no cyanosis or edema Neurologic: Alert and oriented X 3, normal strength and tone. Normal symmetric reflexes. Normal coordination and gait  ECOG PERFORMANCE STATUS: 1 - Symptomatic but completely ambulatory  Blood pressure (!) 134/95, pulse 98, temperature 98.3 F (36.8 C), temperature source Oral, resp. rate 18, height 5\' 3"  (1.6 m), weight 130 lb 14.4 oz (59.4 kg), SpO2 100 %.  LABORATORY DATA: Lab Results  Component Value Date   WBC 7.5 12/29/2018   HGB 13.5 12/29/2018   HCT 39.4 12/29/2018   MCV 114.5 (H) 12/29/2018   PLT 252 12/29/2018      Chemistry      Component Value Date/Time   NA 138 12/08/2018 0845   NA 141 08/01/2017 0835   K 3.7 12/08/2018 0845   K 3.5 08/01/2017 0835   CL 103 12/08/2018 0845   CO2 24 12/08/2018 0845   CO2 25 08/01/2017 0835  BUN 6 12/08/2018 0845   BUN 8.0 08/01/2017 0835   CREATININE 0.80 12/08/2018 0845   CREATININE 0.9 08/01/2017 0835      Component Value Date/Time   CALCIUM 8.7 (L) 12/08/2018 0845   CALCIUM 9.9 08/01/2017 0835   ALKPHOS 145 (H) 12/08/2018 0845   ALKPHOS 142 08/01/2017 0835   AST 22 12/08/2018 0845   AST 15 08/01/2017 0835   ALT 23 12/08/2018 0845   ALT 19 08/01/2017 0835   BILITOT 0.5 12/08/2018 0845   BILITOT 0.35 08/01/2017 0835       RADIOGRAPHIC STUDIES: Ct Chest W Contrast  Result Date: 12/25/2018 CLINICAL DATA:  History small-cell carcinoma of right lung. EXAM: CT CHEST, ABDOMEN, AND PELVIS WITH CONTRAST TECHNIQUE: Multidetector CT imaging of the chest, abdomen and pelvis was performed following the standard protocol during bolus administration of intravenous contrast. CONTRAST:  133mL OMNIPAQUE  IOHEXOL 300 MG/ML  SOLN COMPARISON:  10/02/2018 FINDINGS: CT CHEST FINDINGS Cardiovascular: Right Port-A-Cath tip at high right atrium. Aortic and branch vessel atherosclerosis. Aortic and branch vessel atherosclerosis. Significant stenosis of the proximal left common carotid artery on image 16/2. Ulcerative plaque in the transverse aorta. Normal heart size, without pericardial effusion. Multivessel coronary artery atherosclerosis. No central pulmonary embolism, on this non-dedicated study. Mediastinum/Nodes: No supraclavicular adenopathy. No mediastinal or hilar adenopathy. Tiny hiatal hernia. Lungs/Pleura: No pleural fluid. Anterior right apically pleural-based pulmonary nodule measures 1.3 x 1.1 cm on image 29/4. Compare 1.7 x 1.4 cm at the same level on the prior. Minimal increase in surrounding airspace and ground-glass opacity, presumably radiation induced. Mild centrilobular emphysema. Musculoskeletal: Mild T6 and T8 compression deformities are similar. Moderate T7 and T9 compression deformities are also not significantly changed. No ventral canal encroachment. CT ABDOMEN PELVIS FINDINGS Hepatobiliary: Focal steatosis adjacent the falciform ligament. No suspicious liver lesion. Normal gallbladder, without biliary ductal dilatation. Pancreas: Pancreatic atrophy, without duct dilatation or acute inflammation. Spleen: Normal in size, without focal abnormality. Adrenals/Urinary Tract: Normal adrenal glands. Mild renal cortical thinning bilaterally. Normal kidneys, without hydronephrosis. Normal urinary bladder. Stomach/Bowel: Normal remainder of the stomach. Normal colon and terminal ileum. Normal small bowel. Interstitial thickening within the small bowel mesentery is similar, including on image 76/2. Vascular/Lymphatic: Age advanced aortic and branch vessel atherosclerosis. No abdominopelvic adenopathy. Reproductive: Hysterectomy.  No adnexal mass. Other: No significant free fluid. No evidence of omental or  peritoneal disease. Musculoskeletal: Mild osteopenia. Mild superior endplate compression deformity at L2 is not significantly changed. IMPRESSION: 1. Response to therapy of right apical pleural-based pulmonary nodule. 2. No new or progressive disease. 3. Coronary artery atherosclerosis. 4. Tiny hiatal hernia. 5. Osteopenia with similar thoracolumbar compression deformities. 6. Age advanced coronary artery atherosclerosis. Recommend assessment of coronary risk factors and consideration of medical therapy. 7. Similar nonspecific interstitial thickening within the small bowel mesentery. 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 Aortic Atherosclerosis (ICD10-I70.0) and Emphysema (ICD10-J43.9). Electronically Signed   By: Abigail Miyamoto M.D.   On: 12/25/2018 15:39   Ct Abdomen Pelvis W Contrast  Result Date: 12/25/2018 CLINICAL DATA:  History small-cell carcinoma of right lung. EXAM: CT CHEST, ABDOMEN, AND PELVIS WITH CONTRAST TECHNIQUE: Multidetector CT imaging of the chest, abdomen and pelvis was performed following the standard protocol during bolus administration of intravenous contrast. CONTRAST:  155mL OMNIPAQUE IOHEXOL 300 MG/ML  SOLN COMPARISON:  10/02/2018 FINDINGS: CT CHEST FINDINGS Cardiovascular: Right Port-A-Cath tip at high right atrium. Aortic and branch vessel atherosclerosis. Aortic and branch vessel atherosclerosis. Significant stenosis of the proximal left common carotid artery on image  16/2. Ulcerative plaque in the transverse aorta. Normal heart size, without pericardial effusion. Multivessel coronary artery atherosclerosis. No central pulmonary embolism, on this non-dedicated study. Mediastinum/Nodes: No supraclavicular adenopathy. No mediastinal or hilar adenopathy. Tiny hiatal hernia. Lungs/Pleura: No pleural fluid. Anterior right apically pleural-based pulmonary nodule measures 1.3 x 1.1 cm on image 29/4. Compare 1.7 x 1.4 cm at the same level on the prior. Minimal increase in surrounding airspace and  ground-glass opacity, presumably radiation induced. Mild centrilobular emphysema. Musculoskeletal: Mild T6 and T8 compression deformities are similar. Moderate T7 and T9 compression deformities are also not significantly changed. No ventral canal encroachment. CT ABDOMEN PELVIS FINDINGS Hepatobiliary: Focal steatosis adjacent the falciform ligament. No suspicious liver lesion. Normal gallbladder, without biliary ductal dilatation. Pancreas: Pancreatic atrophy, without duct dilatation or acute inflammation. Spleen: Normal in size, without focal abnormality. Adrenals/Urinary Tract: Normal adrenal glands. Mild renal cortical thinning bilaterally. Normal kidneys, without hydronephrosis. Normal urinary bladder. Stomach/Bowel: Normal remainder of the stomach. Normal colon and terminal ileum. Normal small bowel. Interstitial thickening within the small bowel mesentery is similar, including on image 76/2. Vascular/Lymphatic: Age advanced aortic and branch vessel atherosclerosis. No abdominopelvic adenopathy. Reproductive: Hysterectomy.  No adnexal mass. Other: No significant free fluid. No evidence of omental or peritoneal disease. Musculoskeletal: Mild osteopenia. Mild superior endplate compression deformity at L2 is not significantly changed. IMPRESSION: 1. Response to therapy of right apical pleural-based pulmonary nodule. 2. No new or progressive disease. 3. Coronary artery atherosclerosis. 4. Tiny hiatal hernia. 5. Osteopenia with similar thoracolumbar compression deformities. 6. Age advanced coronary artery atherosclerosis. Recommend assessment of coronary risk factors and consideration of medical therapy. 7. Similar nonspecific interstitial thickening within the small bowel mesentery. 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 Aortic Atherosclerosis (ICD10-I70.0) and Emphysema (ICD10-J43.9). Electronically Signed   By: Abigail Miyamoto M.D.   On: 12/25/2018 15:39    ASSESSMENT AND PLAN:  This is a very pleasant 58 years old  white female with extensive stage small cell lung cancer status post systemic chemotherapy with carbo platinum and etoposide for 6 cycles and the patient rotated her treatment well except for chemotherapy-induced anemia and requirement for PRBCs and platelet transfusion. She had significant improvement in her disease with the chemotherapy. She also had prophylactic cranial irradiation. She also underwent stereotactic radiotherapy to progressive right upper lobe pulmonary nodule. The patient has been in observation for close to 2 years. Repeat CT scan of the chest, abdomen and pelvis performed recently showed evidence for disease progression in the abdomen. The patient was started on systemic chemotherapy again with carboplatin, etoposide and Tecentriq status post 8 cycles.  Starting from cycle #5 the patient is treated with maintenance single agent Tecentriq. The patient continues to tolerate this treatment well with no concerning adverse effects. She had repeat CT scan of the chest, abdomen and pelvis performed recently.  I personally and independently reviewed the scan images and discussed the result and showed the images to the patient today. Her scan showed no concerning findings for disease progression. I recommended for the patient to continue with her current treatment with Tecentriq and she will proceed with cycle #9 today. She will come back for follow-up visit in 3 weeks for evaluation before the next cycle of her treatment. For the chronic back pain, I gave her refill of Percocet. The patient was advised to call immediately if she has any concerning symptoms in the interval. The patient voices understanding of current disease status and treatment options and is in agreement with the current care plan. All  questions were answered. The patient knows to call the clinic with any problems, questions or concerns. We can certainly see the patient much sooner if necessary.  Disclaimer: This note  was dictated with voice recognition software. Similar sounding words can inadvertently be transcribed and may not be corrected upon review.

## 2018-12-29 NOTE — Progress Notes (Signed)
Nutrition Follow-up:  RD working remotely.  Patient with lung cancer followed by Dr. Earlie Server.   Called patient for nutrition follow-up.  Patient reports that she is eating a subway sandwich as we speak.  Reports typically she has been eating about 2 meals per day (lunch and supper).  Still has some issues with smells.  Reports that she continues to drink whole milk. Does not like oral nutrition supplements.  Supper meals she has enjoyed recently have been spaghetti and fried chicken.      Medications: reviewed  Labs: reviewed  Anthropometrics:   Weight 130 lb 14.4 oz stable from 131 lb 14.4 oz on 4/7 and 130 lb 4.8 oz on 3/17   NUTRITION DIAGNOSIS: Inadequate oral intake continues   INTERVENTION:  Encourage adding high calorie, high protein snacks between 2 meals.   Encouraged patient to continue to drink whole milk for additional calories and protein.     MONITORING, EVALUATION, GOAL:Patient will consume adequate calories and protein to maintain weight during treatment     NEXT VISIT: patient agreeable to phone visit on 5/19  Debra Kidd B. Zenia Resides, [place], [place] Registered Dietitian [phone] (pager)

## 2018-12-30 ENCOUNTER — Telehealth: Payer: Self-pay | Admitting: Internal Medicine

## 2018-12-30 NOTE — Telephone Encounter (Signed)
Called regarding schedule °

## 2019-01-19 ENCOUNTER — Encounter: Payer: Self-pay | Admitting: Internal Medicine

## 2019-01-19 ENCOUNTER — Inpatient Hospital Stay: Payer: Medicare Other | Attending: Internal Medicine

## 2019-01-19 ENCOUNTER — Other Ambulatory Visit: Payer: Self-pay

## 2019-01-19 ENCOUNTER — Inpatient Hospital Stay: Payer: Medicare Other

## 2019-01-19 ENCOUNTER — Inpatient Hospital Stay (HOSPITAL_BASED_OUTPATIENT_CLINIC_OR_DEPARTMENT_OTHER): Payer: Medicare Other | Admitting: Internal Medicine

## 2019-01-19 VITALS — BP 135/90 | HR 93 | Temp 99.1°F | Resp 18 | Ht 63.0 in | Wt 130.0 lb

## 2019-01-19 DIAGNOSIS — K449 Diaphragmatic hernia without obstruction or gangrene: Secondary | ICD-10-CM | POA: Insufficient documentation

## 2019-01-19 DIAGNOSIS — Z85828 Personal history of other malignant neoplasm of skin: Secondary | ICD-10-CM

## 2019-01-19 DIAGNOSIS — Z5112 Encounter for antineoplastic immunotherapy: Secondary | ICD-10-CM | POA: Insufficient documentation

## 2019-01-19 DIAGNOSIS — F329 Major depressive disorder, single episode, unspecified: Secondary | ICD-10-CM | POA: Insufficient documentation

## 2019-01-19 DIAGNOSIS — C7889 Secondary malignant neoplasm of other digestive organs: Secondary | ICD-10-CM

## 2019-01-19 DIAGNOSIS — I10 Essential (primary) hypertension: Secondary | ICD-10-CM | POA: Diagnosis not present

## 2019-01-19 DIAGNOSIS — Z923 Personal history of irradiation: Secondary | ICD-10-CM | POA: Insufficient documentation

## 2019-01-19 DIAGNOSIS — E785 Hyperlipidemia, unspecified: Secondary | ICD-10-CM | POA: Insufficient documentation

## 2019-01-19 DIAGNOSIS — Z23 Encounter for immunization: Secondary | ICD-10-CM

## 2019-01-19 DIAGNOSIS — C781 Secondary malignant neoplasm of mediastinum: Secondary | ICD-10-CM | POA: Diagnosis not present

## 2019-01-19 DIAGNOSIS — Z9221 Personal history of antineoplastic chemotherapy: Secondary | ICD-10-CM | POA: Insufficient documentation

## 2019-01-19 DIAGNOSIS — J439 Emphysema, unspecified: Secondary | ICD-10-CM | POA: Insufficient documentation

## 2019-01-19 DIAGNOSIS — M810 Age-related osteoporosis without current pathological fracture: Secondary | ICD-10-CM | POA: Insufficient documentation

## 2019-01-19 DIAGNOSIS — R5382 Chronic fatigue, unspecified: Secondary | ICD-10-CM

## 2019-01-19 DIAGNOSIS — R110 Nausea: Secondary | ICD-10-CM | POA: Insufficient documentation

## 2019-01-19 DIAGNOSIS — M549 Dorsalgia, unspecified: Secondary | ICD-10-CM | POA: Insufficient documentation

## 2019-01-19 DIAGNOSIS — C3491 Malignant neoplasm of unspecified part of right bronchus or lung: Secondary | ICD-10-CM

## 2019-01-19 DIAGNOSIS — G894 Chronic pain syndrome: Secondary | ICD-10-CM | POA: Diagnosis not present

## 2019-01-19 DIAGNOSIS — C3411 Malignant neoplasm of upper lobe, right bronchus or lung: Secondary | ICD-10-CM | POA: Diagnosis not present

## 2019-01-19 DIAGNOSIS — Z79899 Other long term (current) drug therapy: Secondary | ICD-10-CM | POA: Insufficient documentation

## 2019-01-19 DIAGNOSIS — G8929 Other chronic pain: Secondary | ICD-10-CM

## 2019-01-19 LAB — CMP (CANCER CENTER ONLY)
ALT: 21 U/L (ref 0–44)
AST: 18 U/L (ref 15–41)
Albumin: 3.5 g/dL (ref 3.5–5.0)
Alkaline Phosphatase: 145 U/L — ABNORMAL HIGH (ref 38–126)
Anion gap: 13 (ref 5–15)
BUN: 6 mg/dL (ref 6–20)
CO2: 23 mmol/L (ref 22–32)
Calcium: 8.8 mg/dL — ABNORMAL LOW (ref 8.9–10.3)
Chloride: 101 mmol/L (ref 98–111)
Creatinine: 0.9 mg/dL (ref 0.44–1.00)
GFR, Est AFR Am: >60 mL/min (ref >60)
GFR, Estimated: >60 mL/min (ref >60)
Glucose, Bld: 160 mg/dL — ABNORMAL HIGH (ref 70–99)
Potassium: 3.2 mmol/L — ABNORMAL LOW (ref 3.5–5.1)
Sodium: 137 mmol/L (ref 135–145)
Total Bilirubin: 0.4 mg/dL (ref 0.3–1.2)
Total Protein: 7.1 g/dL (ref 6.5–8.1)

## 2019-01-19 LAB — CBC WITH DIFFERENTIAL (CANCER CENTER ONLY)
Abs Immature Granulocytes: 0.02 10*3/uL (ref 0.00–0.07)
Basophils Absolute: 0 10*3/uL (ref 0.0–0.1)
Basophils Relative: 0 %
Eosinophils Absolute: 0.1 10*3/uL (ref 0.0–0.5)
Eosinophils Relative: 1 %
HCT: 37.4 % (ref 36.0–46.0)
Hemoglobin: 13 g/dL (ref 12.0–15.0)
Immature Granulocytes: 0 %
Lymphocytes Relative: 15 %
Lymphs Abs: 1.3 10*3/uL (ref 0.7–4.0)
MCH: 39.9 pg — ABNORMAL HIGH (ref 26.0–34.0)
MCHC: 34.8 g/dL (ref 30.0–36.0)
MCV: 114.7 fL — ABNORMAL HIGH (ref 80.0–100.0)
Monocytes Absolute: 0.4 10*3/uL (ref 0.1–1.0)
Monocytes Relative: 4 %
Neutro Abs: 7.2 10*3/uL (ref 1.7–7.7)
Neutrophils Relative %: 80 %
Platelet Count: 244 10*3/uL (ref 150–400)
RBC: 3.26 MIL/uL — ABNORMAL LOW (ref 3.87–5.11)
RDW: 13.7 % (ref 11.5–15.5)
WBC Count: 9 10*3/uL (ref 4.0–10.5)
nRBC: 0 % (ref 0.0–0.2)

## 2019-01-19 LAB — TSH: TSH: 1.86 u[IU]/mL (ref 0.308–3.960)

## 2019-01-19 MED ORDER — SODIUM CHLORIDE 0.9 % IV SOLN
Freq: Once | INTRAVENOUS | Status: AC
  Administered 2019-01-19: 09:00:00 via INTRAVENOUS
  Filled 2019-01-19: qty 250

## 2019-01-19 MED ORDER — HEPARIN SOD (PORK) LOCK FLUSH 100 UNIT/ML IV SOLN
500.0000 [IU] | Freq: Once | INTRAVENOUS | Status: AC | PRN
  Administered 2019-01-19: 500 [IU]
  Filled 2019-01-19: qty 5

## 2019-01-19 MED ORDER — SODIUM CHLORIDE 0.9 % IV SOLN
1200.0000 mg | Freq: Once | INTRAVENOUS | Status: AC
  Administered 2019-01-19: 1200 mg via INTRAVENOUS
  Filled 2019-01-19: qty 20

## 2019-01-19 MED ORDER — SODIUM CHLORIDE 0.9% FLUSH
10.0000 mL | INTRAVENOUS | Status: DC | PRN
  Administered 2019-01-19: 10 mL
  Filled 2019-01-19: qty 10

## 2019-01-19 MED ORDER — LIDOCAINE-PRILOCAINE 2.5-2.5 % EX CREA: 1.0000 "application " | TOPICAL_CREAM | CUTANEOUS | 0 refills | Status: DC | PRN

## 2019-01-19 MED ORDER — SODIUM CHLORIDE 0.9% FLUSH
10.0000 mL | INTRAVENOUS | Status: DC | PRN
  Administered 2019-01-19: 09:00:00 10 mL
  Filled 2019-01-19: qty 10

## 2019-01-19 MED ORDER — LIDOCAINE-PRILOCAINE 2.5-2.5 % EX CREA
TOPICAL_CREAM | CUTANEOUS | Status: AC
  Filled 2019-01-19: qty 5

## 2019-01-19 NOTE — Patient Instructions (Signed)
[facility] Discharge Instructions for Patients Receiving Chemotherapy  Today you received the following chemotherapy agents: Atezolizumab [facility])  To help prevent nausea and vomiting after your treatment, we encourage you to take your nausea medication as directed.    If you develop nausea and vomiting that is not controlled by your nausea medication, call the clinic.   BELOW ARE SYMPTOMS THAT SHOULD BE REPORTED IMMEDIATELY:  *FEVER GREATER THAN 100.5 F  *CHILLS WITH OR WITHOUT FEVER  NAUSEA AND VOMITING THAT IS NOT CONTROLLED WITH YOUR NAUSEA MEDICATION  *UNUSUAL SHORTNESS OF BREATH  *UNUSUAL BRUISING OR BLEEDING  TENDERNESS IN MOUTH AND THROAT WITH OR WITHOUT PRESENCE OF ULCERS  *URINARY PROBLEMS  *BOWEL PROBLEMS  UNUSUAL RASH Items with * indicate a potential emergency and should be followed up as soon as possible.  Feel free to call the clinic should you have any questions or concerns. The clinic phone number is [phone].  Please show the [facility] at check-in to the Emergency Department and triage nurse.

## 2019-01-19 NOTE — Patient Instructions (Signed)
Steps to Quit Smoking    Smoking tobacco can be bad for your health. It can also affect almost every organ in your body. Smoking puts you and people around you at risk for many serious long-lasting (chronic) diseases. Quitting smoking is hard, but it is one of the best things that you can do for your health. It is never too late to quit.  What are the benefits of quitting smoking?  When you quit smoking, you lower your risk for getting serious diseases and conditions. They can include:  · Lung cancer or lung disease.  · Heart disease.  · Stroke.  · Heart attack.  · Not being able to have children (infertility).  · Weak bones (osteoporosis) and broken bones (fractures).  If you have coughing, wheezing, and shortness of breath, those symptoms may get better when you quit. You may also get sick less often. If you are pregnant, quitting smoking can help to lower your chances of having a baby of low birth weight.  What can I do to help me quit smoking?  Talk with your doctor about what can help you quit smoking. Some things you can do (strategies) include:  · Quitting smoking totally, instead of slowly cutting back how much you smoke over a period of time.  · Going to in-person counseling. You are more likely to quit if you go to many counseling sessions.  · Using resources and support systems, such as:  ? Online chats with a counselor.  ? Phone quitlines.  ? Printed self-help materials.  ? Support groups or group counseling.  ? Text messaging programs.  ? Mobile phone apps or applications.  · Taking medicines. Some of these medicines may have nicotine in them. If you are pregnant or breastfeeding, do not take any medicines to quit smoking unless your doctor says it is okay. Talk with your doctor about counseling or other things that can help you.  Talk with your doctor about using more than one strategy at the same time, such as taking medicines while you are also going to in-person counseling. This can help make  quitting easier.  What things can I do to make it easier to quit?  Quitting smoking might feel very hard at first, but there is a lot that you can do to make it easier. Take these steps:  · Talk to your family and friends. Ask them to support and encourage you.  · Call phone quitlines, reach out to support groups, or work with a counselor.  · Ask people who smoke to not smoke around you.  · Avoid places that make you want (trigger) to smoke, such as:  ? Bars.  ? Parties.  ? Smoke-break areas at work.  · Spend time with people who do not smoke.  · Lower the stress in your life. Stress can make you want to smoke. Try these things to help your stress:  ? Getting regular exercise.  ? Deep-breathing exercises.  ? Yoga.  ? Meditating.  ? Doing a body scan. To do this, close your eyes, focus on one area of your body at a time from head to toe, and notice which parts of your body are tense. Try to relax the muscles in those areas.  · Download or buy apps on your mobile phone or tablet that can help you stick to your quit plan. There are many free apps, such as QuitGuide from the CDC (Centers for Disease Control and Prevention). You can find more   support from smokefree.gov and other websites.  This information is not intended to replace advice given to you by your health care provider. Make sure you discuss any questions you have with your health care provider.  Document Released: 06/15/2009 Document Revised: 04/16/2016 Document Reviewed: 01/03/2015  Elsevier Interactive Patient Education © 2019 [employer] Inc.

## 2019-01-19 NOTE — Progress Notes (Signed)
Nutrition Follow-up:  RD working remotely.  Patient with lung cancer followed by Dr. Earlie Server.    Spoke with husband for nutrition follow-up as patient not available.  Husband reports that she continues to drink whole milk.  Does not like supplements.  Reports that he keeps trying to encourage high protein foods.  Patient likes cheese, peanut butter and some meats.   Medications: remeron  Labs: reviewed  Anthropometrics:   Weight stable at 130 lb today.  130 lb 14.4 oz on 4/28.   NUTRITION DIAGNOSIS: Inadequate oral intake stable   INTERVENTION:  Encouraged to continue high calorie, high protein foods.     MONITORING, EVALUATION, GOAL: Patient will consume adequate calories and protein to maintain weight during treatment.   NEXT VISIT: as needed  Brette Cast B. Zenia Resides, [place], [place] Registered Dietitian [phone] (pager)

## 2019-01-19 NOTE — Progress Notes (Signed)
[place] [phone]   [phone]  OFFICE PROGRESS NOTE  [name], MD [address] [place] [address]  DIAGNOSIS: Extensive stage (T2a, N3, [name], large right anterior mediastinal and supraclavicular lymphadenopathy as well as pancreatic and splenic metastasis diagnosed in September 2017.  The patient had disease progression in October 2019.  PRIOR THERAPY:  1) Systemic chemotherapy was carboplatin for AUC of 5 on day 1 and etoposide 100 MG/M2 on days 1, 2 and 3 with Neulasta support. Status post 6 cycles with significant response of her disease. 2) Prophylactic cranial irradiation under the care of Dr. Sondra Come on 12/02/2016. 3) stereotactic radiotherapy to the recurrent right upper lobe pulmonary nodule under the care of Dr. Sondra Come completed November 10, 2017. 4) Retreatment with systemic chemotherapy with carboplatin for AUC of 5 on day 1 and etoposide 100 mg/M2 on days 1, 2 and 3 as well as Tecentriq (Atezolizumab) 1200 mg IV every 3 weeks with Neulasta support.  First dose June 22, 2018 for disease recurrence.  Status post 5 cycles.  Starting from cycle #2 her dose of carboplatin will be reduced to AUC of 4 and etoposide 80 mg/M2 on days 1, 2 and 3 in addition to the regular dose of Tecentriq.  CURRENT THERAPY: Maintenance treatment with single agent Tecentriq 1200 mg IV every 3 weeks.  Status post 4 cycles.  INTERVAL HISTORY: Debra Kidd 58 y.o. female returns to the clinic today for follow-up visit.  The patient is feeling fine today with no concerning complaints except for occasional nausea.  She denied having any current chest pain, shortness of breath, cough or hemoptysis.  She denied having any fever or chills.  She has no vomiting, diarrhea or constipation.  She denied having any headache or visual changes.  She lost some balance since her last visit.  The patient is here  today for evaluation before starting cycle #5 of her maintenance treatment with Tecentriq.  MEDICAL HISTORY: Past Medical History:  Diagnosis Date   Basal cell carcinoma of cheek    L side of face   Blood type, Rh positive    Cancer (HCC)    Chronic pain syndrome    Depression 08/07/2016   Encounter for antineoplastic chemotherapy 07/17/2016   History of external beam radiation therapy 11/19/16-12/02/16   brain 25 Gy in 10 fractions   Hyperlipidemia    Hypertension    Hypertension 08/07/2016   Osteoporosis     ALLERGIES:  is allergic to codeine; motrin [ibuprofen]; thiazide-type diuretics; and vicodin [hydrocodone-acetaminophen].  MEDICATIONS:  Current Outpatient Medications  Medication Sig Dispense Refill   celecoxib (CELEBREX) 200 MG capsule Take 200 mg by mouth daily as needed for mild pain.      diazepam (VALIUM) 5 MG tablet Take 5 mg by mouth 4 (four) times daily as needed for anxiety.   2   diphenhydrAMINE (BENADRYL) 25 MG tablet Take 25 mg by mouth every 6 (six) hours as needed for itching or allergies.      lidocaine-prilocaine (EMLA) cream Apply 1 application topically as needed. (Patient taking differently: Apply 1 application topically daily as needed (access port). ) 30 g 0   loperamide (IMODIUM A-D) 2 MG tablet Take 2 mg by mouth 4 (four) times daily as needed for diarrhea or loose stools.     methocarbamol (ROBAXIN) 500 MG tablet Take 1 tablet (500 mg total) by  mouth 3 (three) times daily. (Patient not taking: Reported on 10/06/2018) 30 tablet 0   mirtazapine (REMERON) 15 MG tablet Take 1 tablet (15 mg total) by mouth at bedtime. 30 tablet 1   oxyCODONE-acetaminophen (PERCOCET) 5-325 MG tablet Take 1 tablet by mouth every 6 (six) hours as needed for severe pain. 30 tablet 0   polyethylene glycol (MIRALAX / GLYCOLAX) packet Take 17 g by mouth daily. (Patient not taking: Reported on 10/06/2018) 14 each 0   potassium chloride 20 MEQ/15ML (10%) SOLN Take 30 mLs  (40 mEq total) by mouth daily. 90 mL 0   prochlorperazine (COMPAZINE) 10 MG tablet Take 1 tablet (10 mg total) by mouth every 6 (six) hours as needed for nausea or vomiting. 30 tablet 0   senna-docusate (SENOKOT-S) 8.6-50 MG tablet Take 2 tablets by mouth at bedtime. (Patient not taking: Reported on 10/27/2018) 5 tablet 0   traMADol (ULTRAM) 50 MG tablet Take 50 mg by mouth every 6 (six) hours as needed for moderate pain.      Vitamin D, Ergocalciferol, (DRISDOL) 1.25 MG (50000 UT) CAPS capsule Take 1 capsule (50,000 Units total) by mouth every 7 (seven) days. 5 capsule 0   No current facility-administered medications for this visit.    Facility-Administered Medications Ordered in Other Visits  Medication Dose Route Frequency Provider Last Rate Last Dose   sodium chloride flush (NS) 0.9 % injection 10 mL  10 mL Intracatheter PRN [name], MD   10 mL at 01/19/19 0830    SURGICAL HISTORY:  Past Surgical History:  Procedure Laterality Date   ABDOMINAL HYSTERECTOMY  1981   APPENDECTOMY     at age 62   EYE SURGERY     left   IR GENERIC HISTORICAL  06/03/2016   IR FLUORO GUIDE PORT INSERTION RIGHT 06/03/2016 [facility]   IR GENERIC HISTORICAL  06/03/2016   IR US GUIDE VASC ACCESS RIGHT 06/03/2016 [facility]   SKIN CANCER EXCISION     basal cell carcinoma L side of face    REVIEW OF SYSTEMS:  A comprehensive review of systems was negative except for: Gastrointestinal: positive for nausea   PHYSICAL EXAMINATION: General appearance: alert, cooperative and no distress Head: Normocephalic, without obvious abnormality, atraumatic Neck: no adenopathy, no JVD, supple, symmetrical, trachea midline and thyroid not enlarged, symmetric, no tenderness/mass/nodules Lymph nodes: Cervical, supraclavicular, and axillary nodes normal. Resp: clear to auscultation bilaterally Back: symmetric, no curvature. ROM normal. No CVA tenderness. Cardio: regular rate and rhythm, S1, S2 normal,  no murmur, click, rub or gallop GI: soft, non-tender; bowel sounds normal; no masses,  no organomegaly Extremities: extremities normal, atraumatic, no cyanosis or edema  ECOG PERFORMANCE STATUS: 1 - Symptomatic but completely ambulatory  Blood pressure 135/90, pulse 93, temperature 99.1 F (37.3 C), temperature source Oral, resp. rate 18, height 5\' 3"  (1.6 m), weight 130 lb (59 kg), SpO2 100 %.  LABORATORY DATA: Lab Results  Component Value Date   WBC 7.5 12/29/2018   HGB 13.5 12/29/2018   HCT 39.4 12/29/2018   MCV 114.5 (H) 12/29/2018   PLT 252 12/29/2018      Chemistry      Component Value Date/Time   NA 140 12/29/2018 0945   NA 141 08/01/2017 0835   K 3.7 12/29/2018 0945   K 3.5 08/01/2017 0835   CL 105 12/29/2018 0945   CO2 23 12/29/2018 0945   CO2 25 08/01/2017 0835   BUN 6 12/29/2018 0945   BUN 8.0 08/01/2017 0835  CREATININE 0.81 12/29/2018 0945   CREATININE 0.9 08/01/2017 0835      Component Value Date/Time   CALCIUM 9.1 12/29/2018 0945   CALCIUM 9.9 08/01/2017 0835   ALKPHOS 152 (H) 12/29/2018 0945   ALKPHOS 142 08/01/2017 0835   AST 17 12/29/2018 0945   AST 15 08/01/2017 0835   ALT 16 12/29/2018 0945   ALT 19 08/01/2017 0835   BILITOT 0.3 12/29/2018 0945   BILITOT 0.35 08/01/2017 0835       RADIOGRAPHIC STUDIES: Ct Chest W Contrast  Result Date: 12/25/2018 CLINICAL DATA:  History small-cell carcinoma of right lung. EXAM: CT CHEST, ABDOMEN, AND PELVIS WITH CONTRAST TECHNIQUE: Multidetector CT imaging of the chest, abdomen and pelvis was performed following the standard protocol during bolus administration of intravenous contrast. CONTRAST:  111mL OMNIPAQUE IOHEXOL 300 MG/ML  SOLN COMPARISON:  10/02/2018 FINDINGS: CT CHEST FINDINGS Cardiovascular: Right Port-A-Cath tip at high right atrium. Aortic and branch vessel atherosclerosis. Aortic and branch vessel atherosclerosis. Significant stenosis of the proximal left common carotid artery on image 16/2.  Ulcerative plaque in the transverse aorta. Normal heart size, without pericardial effusion. Multivessel coronary artery atherosclerosis. No central pulmonary embolism, on this non-dedicated study. Mediastinum/Nodes: No supraclavicular adenopathy. No mediastinal or hilar adenopathy. Tiny hiatal hernia. Lungs/Pleura: No pleural fluid. Anterior right apically pleural-based pulmonary nodule measures 1.3 x 1.1 cm on image 29/4. Compare 1.7 x 1.4 cm at the same level on the prior. Minimal increase in surrounding airspace and ground-glass opacity, presumably radiation induced. Mild centrilobular emphysema. Musculoskeletal: Mild T6 and T8 compression deformities are similar. Moderate T7 and T9 compression deformities are also not significantly changed. No ventral canal encroachment. CT ABDOMEN PELVIS FINDINGS Hepatobiliary: Focal steatosis adjacent the falciform ligament. No suspicious liver lesion. Normal gallbladder, without biliary ductal dilatation. Pancreas: Pancreatic atrophy, without duct dilatation or acute inflammation. Spleen: Normal in size, without focal abnormality. Adrenals/Urinary Tract: Normal adrenal glands. Mild renal cortical thinning bilaterally. Normal kidneys, without hydronephrosis. Normal urinary bladder. Stomach/Bowel: Normal remainder of the stomach. Normal colon and terminal ileum. Normal small bowel. Interstitial thickening within the small bowel mesentery is similar, including on image 76/2. Vascular/Lymphatic: Age advanced aortic and branch vessel atherosclerosis. No abdominopelvic adenopathy. Reproductive: Hysterectomy.  No adnexal mass. Other: No significant free fluid. No evidence of omental or peritoneal disease. Musculoskeletal: Mild osteopenia. Mild superior endplate compression deformity at L2 is not significantly changed. IMPRESSION: 1. Response to therapy of right apical pleural-based pulmonary nodule. 2. No new or progressive disease. 3. Coronary artery atherosclerosis. 4. Tiny hiatal  hernia. 5. Osteopenia with similar thoracolumbar compression deformities. 6. Age advanced coronary artery atherosclerosis. Recommend assessment of coronary risk factors and consideration of medical therapy. 7. Similar nonspecific interstitial thickening within the small bowel mesentery. 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 Aortic Atherosclerosis (ICD10-I70.0) and Emphysema (ICD10-J43.9). Electronically Signed   By: Abigail Miyamoto M.D.   On: 12/25/2018 15:39   Ct Abdomen Pelvis W Contrast  Result Date: 12/25/2018 CLINICAL DATA:  History small-cell carcinoma of right lung. EXAM: CT CHEST, ABDOMEN, AND PELVIS WITH CONTRAST TECHNIQUE: Multidetector CT imaging of the chest, abdomen and pelvis was performed following the standard protocol during bolus administration of intravenous contrast. CONTRAST:  172mL OMNIPAQUE IOHEXOL 300 MG/ML  SOLN COMPARISON:  10/02/2018 FINDINGS: CT CHEST FINDINGS Cardiovascular: Right Port-A-Cath tip at high right atrium. Aortic and branch vessel atherosclerosis. Aortic and branch vessel atherosclerosis. Significant stenosis of the proximal left common carotid artery on image 16/2. Ulcerative plaque in the transverse aorta. Normal heart size, without pericardial effusion.  Multivessel coronary artery atherosclerosis. No central pulmonary embolism, on this non-dedicated study. Mediastinum/Nodes: No supraclavicular adenopathy. No mediastinal or hilar adenopathy. Tiny hiatal hernia. Lungs/Pleura: No pleural fluid. Anterior right apically pleural-based pulmonary nodule measures 1.3 x 1.1 cm on image 29/4. Compare 1.7 x 1.4 cm at the same level on the prior. Minimal increase in surrounding airspace and ground-glass opacity, presumably radiation induced. Mild centrilobular emphysema. Musculoskeletal: Mild T6 and T8 compression deformities are similar. Moderate T7 and T9 compression deformities are also not significantly changed. No ventral canal encroachment. CT ABDOMEN PELVIS FINDINGS Hepatobiliary:  Focal steatosis adjacent the falciform ligament. No suspicious liver lesion. Normal gallbladder, without biliary ductal dilatation. Pancreas: Pancreatic atrophy, without duct dilatation or acute inflammation. Spleen: Normal in size, without focal abnormality. Adrenals/Urinary Tract: Normal adrenal glands. Mild renal cortical thinning bilaterally. Normal kidneys, without hydronephrosis. Normal urinary bladder. Stomach/Bowel: Normal remainder of the stomach. Normal colon and terminal ileum. Normal small bowel. Interstitial thickening within the small bowel mesentery is similar, including on image 76/2. Vascular/Lymphatic: Age advanced aortic and branch vessel atherosclerosis. No abdominopelvic adenopathy. Reproductive: Hysterectomy.  No adnexal mass. Other: No significant free fluid. No evidence of omental or peritoneal disease. Musculoskeletal: Mild osteopenia. Mild superior endplate compression deformity at L2 is not significantly changed. IMPRESSION: 1. Response to therapy of right apical pleural-based pulmonary nodule. 2. No new or progressive disease. 3. Coronary artery atherosclerosis. 4. Tiny hiatal hernia. 5. Osteopenia with similar thoracolumbar compression deformities. 6. Age advanced coronary artery atherosclerosis. Recommend assessment of coronary risk factors and consideration of medical therapy. 7. Similar nonspecific interstitial thickening within the small bowel mesentery. 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 Aortic Atherosclerosis (ICD10-I70.0) and Emphysema (ICD10-J43.9). Electronically Signed   By: Abigail Miyamoto M.D.   On: 12/25/2018 15:39    ASSESSMENT AND PLAN:  This is a very pleasant 58 years old white female with extensive stage small cell lung cancer status post systemic chemotherapy with carbo platinum and etoposide for 6 cycles and the patient rotated her treatment well except for chemotherapy-induced anemia and requirement for PRBCs and platelet transfusion. She had significant improvement  in her disease with the chemotherapy. She also had prophylactic cranial irradiation. She also underwent stereotactic radiotherapy to progressive right upper lobe pulmonary nodule. The patient has been in observation for close to 2 years. Repeat CT scan of the chest, abdomen and pelvis performed recently showed evidence for disease progression in the abdomen. The patient was started on systemic chemotherapy again with carboplatin, etoposide and Tecentriq status post 9 cycles.  Starting from cycle #5 the patient is treated with maintenance single agent Tecentriq. The patient continues to tolerate her maintenance therapy fairly well. I recommended for her to proceed with cycle #10 of her treatment today as planned. For the chronic back pain, she will continue on Percocet and as needed basis. The patient will come back for follow-up visit in 3 weeks for evaluation before starting cycle #11. She was advised to call immediately if she has any concerning symptoms in the interval. The patient voices understanding of current disease status and treatment options and is in agreement with the current care plan. All questions were answered. The patient knows to call the clinic with any problems, questions or concerns. We can certainly see the patient much sooner if necessary.  Disclaimer: This note was dictated with voice recognition software. Similar sounding words can inadvertently be transcribed and may not be corrected upon review.

## 2019-01-28 ENCOUNTER — Encounter (HOSPITAL_COMMUNITY): Payer: Self-pay

## 2019-01-28 ENCOUNTER — Emergency Department (HOSPITAL_COMMUNITY): Payer: Medicare Other

## 2019-01-28 ENCOUNTER — Inpatient Hospital Stay (HOSPITAL_COMMUNITY)
Admission: EM | Admit: 2019-01-28 | Discharge: 2019-01-30 | DRG: 064 | Disposition: A | Discharge status: Home / Self Care | Payer: Medicare Other | Attending: Internal Medicine | Admitting: Internal Medicine

## 2019-01-28 ENCOUNTER — Other Ambulatory Visit: Payer: Self-pay

## 2019-01-28 DIAGNOSIS — Z85118 Personal history of other malignant neoplasm of bronchus and lung: Secondary | ICD-10-CM

## 2019-01-28 DIAGNOSIS — R531 Weakness: Secondary | ICD-10-CM

## 2019-01-28 DIAGNOSIS — E785 Hyperlipidemia, unspecified: Secondary | ICD-10-CM | POA: Diagnosis present

## 2019-01-28 DIAGNOSIS — Z885 Allergy status to narcotic agent status: Secondary | ICD-10-CM

## 2019-01-28 DIAGNOSIS — G894 Chronic pain syndrome: Secondary | ICD-10-CM | POA: Diagnosis present

## 2019-01-28 DIAGNOSIS — J9811 Atelectasis: Secondary | ICD-10-CM | POA: Diagnosis present

## 2019-01-28 DIAGNOSIS — R29706 NIHSS score 6: Secondary | ICD-10-CM | POA: Diagnosis present

## 2019-01-28 DIAGNOSIS — U071 COVID-19: Secondary | ICD-10-CM | POA: Diagnosis present

## 2019-01-28 DIAGNOSIS — Z79891 Long term (current) use of opiate analgesic: Secondary | ICD-10-CM

## 2019-01-28 DIAGNOSIS — E876 Hypokalemia: Secondary | ICD-10-CM | POA: Diagnosis present

## 2019-01-28 DIAGNOSIS — Z888 Allergy status to other drugs, medicaments and biological substances status: Secondary | ICD-10-CM

## 2019-01-28 DIAGNOSIS — I6522 Occlusion and stenosis of left carotid artery: Secondary | ICD-10-CM

## 2019-01-28 DIAGNOSIS — I63431 Cerebral infarction due to embolism of right posterior cerebral artery: Principal | ICD-10-CM | POA: Diagnosis present

## 2019-01-28 DIAGNOSIS — Z85828 Personal history of other malignant neoplasm of skin: Secondary | ICD-10-CM

## 2019-01-28 DIAGNOSIS — Z9221 Personal history of antineoplastic chemotherapy: Secondary | ICD-10-CM

## 2019-01-28 DIAGNOSIS — F17210 Nicotine dependence, cigarettes, uncomplicated: Secondary | ICD-10-CM | POA: Diagnosis present

## 2019-01-28 DIAGNOSIS — I639 Cerebral infarction, unspecified: Secondary | ICD-10-CM | POA: Diagnosis not present

## 2019-01-28 DIAGNOSIS — R29810 Facial weakness: Secondary | ICD-10-CM | POA: Diagnosis present

## 2019-01-28 DIAGNOSIS — M810 Age-related osteoporosis without current pathological fracture: Secondary | ICD-10-CM | POA: Diagnosis present

## 2019-01-28 DIAGNOSIS — Z90710 Acquired absence of both cervix and uterus: Secondary | ICD-10-CM

## 2019-01-28 DIAGNOSIS — R4781 Slurred speech: Secondary | ICD-10-CM | POA: Diagnosis present

## 2019-01-28 DIAGNOSIS — Z8249 Family history of ischemic heart disease and other diseases of the circulatory system: Secondary | ICD-10-CM

## 2019-01-28 DIAGNOSIS — Z803 Family history of malignant neoplasm of breast: Secondary | ICD-10-CM

## 2019-01-28 DIAGNOSIS — C3491 Malignant neoplasm of unspecified part of right bronchus or lung: Secondary | ICD-10-CM | POA: Diagnosis present

## 2019-01-28 DIAGNOSIS — Z825 Family history of asthma and other chronic lower respiratory diseases: Secondary | ICD-10-CM

## 2019-01-28 DIAGNOSIS — Z923 Personal history of irradiation: Secondary | ICD-10-CM

## 2019-01-28 DIAGNOSIS — I10 Essential (primary) hypertension: Secondary | ICD-10-CM | POA: Diagnosis present

## 2019-01-28 DIAGNOSIS — F419 Anxiety disorder, unspecified: Secondary | ICD-10-CM | POA: Diagnosis present

## 2019-01-28 DIAGNOSIS — Z79899 Other long term (current) drug therapy: Secondary | ICD-10-CM

## 2019-01-28 DIAGNOSIS — Z716 Tobacco abuse counseling: Secondary | ICD-10-CM

## 2019-01-28 DIAGNOSIS — G8194 Hemiplegia, unspecified affecting left nondominant side: Secondary | ICD-10-CM | POA: Diagnosis present

## 2019-01-28 HISTORY — DX: Malignant neoplasm of unspecified part of unspecified bronchus or lung: C34.90

## 2019-01-28 LAB — URINALYSIS, ROUTINE W REFLEX MICROSCOPIC
Bilirubin Urine: NEGATIVE
Glucose, UA: NEGATIVE mg/dL
Ketones, ur: NEGATIVE mg/dL
Leukocytes,Ua: NEGATIVE
Nitrite: NEGATIVE
Protein, ur: NEGATIVE mg/dL
Specific Gravity, Urine: 1.003 — ABNORMAL LOW (ref 1.005–1.030)
pH: 6 (ref 5.0–8.0)

## 2019-01-28 LAB — CBC
HCT: 34.8 % — ABNORMAL LOW (ref 36.0–46.0)
Hemoglobin: 12.4 g/dL (ref 12.0–15.0)
MCH: 40.5 pg — ABNORMAL HIGH (ref 26.0–34.0)
MCHC: 35.6 g/dL (ref 30.0–36.0)
MCV: 113.7 fL — ABNORMAL HIGH (ref 80.0–100.0)
Platelets: 243 10*3/uL (ref 150–400)
RBC: 3.06 MIL/uL — ABNORMAL LOW (ref 3.87–5.11)
RDW: 13.2 % (ref 11.5–15.5)
WBC: 9.5 10*3/uL (ref 4.0–10.5)
nRBC: 0 % (ref 0.0–0.2)

## 2019-01-28 LAB — BASIC METABOLIC PANEL
Anion gap: 9 (ref 5–15)
BUN: 8 mg/dL (ref 6–20)
CO2: 29 mmol/L (ref 22–32)
Calcium: 9.1 mg/dL (ref 8.9–10.3)
Chloride: 100 mmol/L (ref 98–111)
Creatinine, Ser: 0.75 mg/dL (ref 0.44–1.00)
GFR calc Af Amer: >60 mL/min (ref >60)
GFR calc non Af Amer: >60 mL/min (ref >60)
Glucose, Bld: 111 mg/dL — ABNORMAL HIGH (ref 70–99)
Potassium: 2.9 mmol/L — ABNORMAL LOW (ref 3.5–5.1)
Sodium: 138 mmol/L (ref 135–145)

## 2019-01-28 MED ORDER — POTASSIUM CHLORIDE CRYS ER 20 MEQ PO TBCR: 40.0000 meq | EXTENDED_RELEASE_TABLET | Freq: Once | ORAL | Status: DC

## 2019-01-28 MED ORDER — LIDOCAINE VISCOUS HCL 2 % MT SOLN
15.0000 mL | Freq: Once | OROMUCOSAL | Status: AC
  Administered 2019-01-28: 15 mL via ORAL
  Filled 2019-01-28: qty 15

## 2019-01-28 MED ORDER — POTASSIUM CHLORIDE 20 MEQ PO PACK
40.0000 meq | PACK | Freq: Once | ORAL | Status: AC
  Administered 2019-01-29: 40 meq via ORAL
  Filled 2019-01-28: qty 2

## 2019-01-28 MED ORDER — ALUM & MAG HYDROXIDE-SIMETH 200-200-20 MG/5ML PO SUSP
30.0000 mL | Freq: Once | ORAL | Status: AC
  Administered 2019-01-28: 30 mL via ORAL
  Filled 2019-01-28: qty 30

## 2019-01-28 NOTE — ED Notes (Signed)
EKG given to EDP,Bero,MD., for review. 

## 2019-01-28 NOTE — ED Provider Notes (Addendum)
[facility] Emergency Department Provider Note MRN:  [id number]  Arrival date & time: 01/29/19     Chief Complaint   Weakness   History of Present Illness   Debra Kidd is a 58 y.o. year-old female with a history of lung cancer presenting to the ED with chief complaint of weakness.  Sudden onset left-sided weakness that began 3 days ago, constant since that time.  Weakness located in the left arm, left leg, left side of the face.  Endorsing headache 3 days ago as well, now resolved.  Denying nausea or vomiting,, no chest pain or shortness of breath, no abdominal pain, no dysuria.  No exacerbating or alleviating factors.  Review of Systems  A complete 10 system review of systems was obtained and all systems are negative except as noted in the HPI and PMH.   Patient's Health History    Past Medical History:  Diagnosis Date  . 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 Blood type, Rh positive   . Cancer ([place])   . Chronic pain syndrome   . Depression 08/07/2016  . Encounter for antineoplastic chemotherapy 07/17/2016  . History of external beam radiation therapy 11/19/16-12/02/16   brain 25 Gy in 10 fractions  . Hyperlipidemia   . Hypertension   . Hypertension 08/07/2016  . Osteoporosis     Past Surgical History:  Procedure Laterality Date  . ABDOMINAL HYSTERECTOMY  1981  . APPENDECTOMY     at age 67  . EYE SURGERY     left  . IR GENERIC HISTORICAL  06/03/2016   IR FLUORO GUIDE PORT INSERTION RIGHT 06/03/2016 [facility]  . IR GENERIC HISTORICAL  06/03/2016   IR US GUIDE VASC ACCESS RIGHT 06/03/2016 [facility]  . SKIN CANCER EXCISION     basal cell carcinoma L side of face    Family History  Problem Relation Age of Onset  . Heart disease Mother   . Heart disease Father   . Emphysema Brother   . Breast cancer Maternal Grandmother   . Heart disease Brother     Social History   Socioeconomic History  . Marital status: Married    Spouse  name: Not on file  . Number of children: 2  . Years of education: Not on file  . Highest education level: Not on file  Occupational History  . Not on file  Social Needs  . Financial resource strain: Not on file  . Food insecurity:    Worry: Not on file    Inability: Not on file  . Transportation needs:    Medical: Not on file    Non-medical: Not on file  Tobacco Use  . Smoking status: Current Every Day Smoker    Packs/day: 1.00    Years: 43.00    Pack years: 43.00  . Smokeless tobacco: Never Used  Substance and Sexual Activity  . Alcohol use: Not Currently    Comment: quit drinking beer 02/2016  . Drug use: Yes    Frequency: 7.0 times per week    Types: Marijuana  . Sexual activity: Not on file  Lifestyle  . Physical activity:    Days per week: Not on file    Minutes per session: Not on file  . Stress: Not on file  Relationships  . Social connections:    Talks on phone: Not on file    Gets together: Not on file    Attends religious service:  Not on file    Active member of club or organization: Not on file    Attends meetings of clubs or organizations: Not on file    Relationship status: Not on file  . Intimate partner violence:    Fear of current or ex partner: Not on file    Emotionally abused: Not on file    Physically abused: Not on file    Forced sexual activity: Not on file  Other Topics Concern  . 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  Currently on disability     Physical Exam  Vital Signs and Nursing Notes reviewed Vitals:   01/28/19 2125 01/28/19 2345  BP: (!) 160/113 (!) 140/92  Pulse: 92 81  Resp: 16 20  Temp: 97.9 F (36.6 C)   SpO2: 99% 99%    CONSTITUTIONAL: Well-appearing, NAD NEURO:  Alert and oriented x 3, moderate left arm and left leg weakness, moderate left facial droop, minimal decrease sensation to the left arm and left leg, no  visual field cuts, no dysarthria, no neglect, no aphasia EYES:  eyes equal and reactive ENT/NECK:  no LAD, no JVD CARDIO: Regular rate, well-perfused, normal S1 and S2 PULM:  CTAB no wheezing or rhonchi GI/GU:  normal bowel sounds, non-distended, non-tender MSK/SPINE:  No gross deformities, no edema SKIN:  no rash, atraumatic PSYCH:  Appropriate speech and behavior  Diagnostic and Interventional Summary    EKG Interpretation  Date/Time:  Thursday Jan 28 2019 22:11:37 EDT Ventricular Rate:  87 PR Interval:    QRS Duration: 89 QT Interval:  391 QTC Calculation: 471 R Axis:   49 Text Interpretation:  Sinus rhythm No significant change since last tracing Confirmed by Gerlene Fee [phone]) on 01/29/2019 12:05:10 AM      Labs Reviewed  CBC - Abnormal; Notable for the following components:      Result Value   RBC 3.06 (*)    HCT 34.8 (*)    MCV 113.7 (*)    MCH 40.5 (*)    All other components within normal limits  BASIC METABOLIC PANEL - Abnormal; Notable for the following components:   Potassium 2.9 (*)    Glucose, Bld 111 (*)    All other components within normal limits  URINALYSIS, ROUTINE W REFLEX MICROSCOPIC - Abnormal; Notable for the following components:   Color, Urine STRAW (*)    Specific Gravity, Urine 1.003 (*)    Hgb urine dipstick SMALL (*)    Bacteria, UA RARE (*)    All other components within normal limits  SARS CORONAVIRUS 2 (HOSPITAL ORDER, [place] [facility])    CT HEAD WO CONTRAST    (Results Pending)    Medications  potassium chloride (KLOR-CON) packet 40 mEq (has no administration in time range)  alum & mag hydroxide-simeth (MAALOX/MYLANTA) 200-200-20 MG/5ML suspension 30 mL (30 mLs Oral Given 01/28/19 2344)    And  lidocaine (XYLOCAINE) 2 % viscous mouth solution 15 mL (15 mLs Oral Given 01/28/19 2344)     Procedures Critical Care Critical Care Documentation Critical care time provided by me (excluding procedures): 32 minutes   Condition necessitating critical care: Acute ischemic stroke  Components of critical care management: reviewing of prior records, laboratory and imaging interpretation, frequent re-examination and reassessment of vital signs, discussion with consulting services.    ED Course and Medical Decision Making  I have reviewed the triage vital signs and the nursing notes.  Pertinent labs & imaging results that were available during my care of the patient were reviewed by me and considered in my medical decision making (see below for details).  Concern for completed ischemic stroke, will begin with CT, anticipating MRI and admission.  Labs largely reassuring, potassium repleted here in the emergency department.  CT head is unremarkable.  Admitted to hospital service for further care, Dr. Maudie Mercury accepting physician who will facilitate transfer to [facility].  Dr. Leonel Ramsay of neurology made aware.  Barth Kirks. [place]onia Small, [place] [email or web address]  Final Clinical Impressions(s) / ED Diagnoses     ICD-10-CM   1. Acute ischemic stroke [facility]) I63.9     ED Discharge Orders    None         Maudie Flakes, MD 01/29/19 0005    Maudie Flakes, MD 01/29/19 [id number]

## 2019-01-28 NOTE — ED Triage Notes (Signed)
Pt states she fell off the toilet Monday night and landed on her left side, now she says her left arm and hand is numb and tingly, she also states that her left knee is weak and feels like her left side is weaker than normal Pt uses a walker with assistance and that hasn't changed Her husband says that her speech sounds slurred for the last day

## 2019-01-29 ENCOUNTER — Inpatient Hospital Stay (HOSPITAL_COMMUNITY): Payer: Medicare Other

## 2019-01-29 ENCOUNTER — Encounter (HOSPITAL_COMMUNITY): Payer: Self-pay | Admitting: Internal Medicine

## 2019-01-29 ENCOUNTER — Other Ambulatory Visit: Payer: Self-pay

## 2019-01-29 DIAGNOSIS — Z85828 Personal history of other malignant neoplasm of skin: Secondary | ICD-10-CM | POA: Diagnosis not present

## 2019-01-29 DIAGNOSIS — E876 Hypokalemia: Secondary | ICD-10-CM

## 2019-01-29 DIAGNOSIS — G894 Chronic pain syndrome: Secondary | ICD-10-CM | POA: Diagnosis not present

## 2019-01-29 DIAGNOSIS — Z825 Family history of asthma and other chronic lower respiratory diseases: Secondary | ICD-10-CM | POA: Diagnosis not present

## 2019-01-29 DIAGNOSIS — C3491 Malignant neoplasm of unspecified part of right bronchus or lung: Secondary | ICD-10-CM | POA: Diagnosis not present

## 2019-01-29 DIAGNOSIS — Z803 Family history of malignant neoplasm of breast: Secondary | ICD-10-CM | POA: Diagnosis not present

## 2019-01-29 DIAGNOSIS — Z923 Personal history of irradiation: Secondary | ICD-10-CM | POA: Diagnosis not present

## 2019-01-29 DIAGNOSIS — I639 Cerebral infarction, unspecified: Secondary | ICD-10-CM

## 2019-01-29 DIAGNOSIS — R29706 NIHSS score 6: Secondary | ICD-10-CM | POA: Diagnosis not present

## 2019-01-29 DIAGNOSIS — F419 Anxiety disorder, unspecified: Secondary | ICD-10-CM | POA: Diagnosis not present

## 2019-01-29 DIAGNOSIS — U071 COVID-19: Secondary | ICD-10-CM | POA: Diagnosis present

## 2019-01-29 DIAGNOSIS — R531 Weakness: Secondary | ICD-10-CM | POA: Diagnosis not present

## 2019-01-29 DIAGNOSIS — I63431 Cerebral infarction due to embolism of right posterior cerebral artery: Secondary | ICD-10-CM | POA: Diagnosis not present

## 2019-01-29 DIAGNOSIS — I340 Nonrheumatic mitral (valve) insufficiency: Secondary | ICD-10-CM

## 2019-01-29 DIAGNOSIS — Z888 Allergy status to other drugs, medicaments and biological substances status: Secondary | ICD-10-CM | POA: Diagnosis not present

## 2019-01-29 DIAGNOSIS — M810 Age-related osteoporosis without current pathological fracture: Secondary | ICD-10-CM | POA: Diagnosis not present

## 2019-01-29 DIAGNOSIS — J9811 Atelectasis: Secondary | ICD-10-CM | POA: Diagnosis not present

## 2019-01-29 DIAGNOSIS — Z716 Tobacco abuse counseling: Secondary | ICD-10-CM | POA: Diagnosis not present

## 2019-01-29 DIAGNOSIS — R4781 Slurred speech: Secondary | ICD-10-CM | POA: Diagnosis not present

## 2019-01-29 DIAGNOSIS — Z8249 Family history of ischemic heart disease and other diseases of the circulatory system: Secondary | ICD-10-CM | POA: Diagnosis not present

## 2019-01-29 DIAGNOSIS — Z885 Allergy status to narcotic agent status: Secondary | ICD-10-CM | POA: Diagnosis not present

## 2019-01-29 DIAGNOSIS — Z85118 Personal history of other malignant neoplasm of bronchus and lung: Secondary | ICD-10-CM | POA: Diagnosis not present

## 2019-01-29 DIAGNOSIS — I10 Essential (primary) hypertension: Secondary | ICD-10-CM | POA: Diagnosis not present

## 2019-01-29 DIAGNOSIS — G8194 Hemiplegia, unspecified affecting left nondominant side: Secondary | ICD-10-CM | POA: Diagnosis not present

## 2019-01-29 DIAGNOSIS — Z90710 Acquired absence of both cervix and uterus: Secondary | ICD-10-CM | POA: Diagnosis not present

## 2019-01-29 DIAGNOSIS — Z9221 Personal history of antineoplastic chemotherapy: Secondary | ICD-10-CM | POA: Diagnosis not present

## 2019-01-29 DIAGNOSIS — F17210 Nicotine dependence, cigarettes, uncomplicated: Secondary | ICD-10-CM | POA: Diagnosis not present

## 2019-01-29 LAB — LACTATE DEHYDROGENASE: LDH: 102 U/L (ref 98–192)

## 2019-01-29 LAB — D-DIMER, QUANTITATIVE: D-Dimer, Quant: 0.61 ug/mL-FEU — ABNORMAL HIGH (ref 0.00–0.50)

## 2019-01-29 LAB — FERRITIN: Ferritin: 712 ng/mL — ABNORMAL HIGH (ref 11–307)

## 2019-01-29 LAB — FIBRINOGEN: Fibrinogen: 485 mg/dL — ABNORMAL HIGH (ref 210–475)

## 2019-01-29 LAB — SEDIMENTATION RATE: Sed Rate: 68 mm/hr — ABNORMAL HIGH (ref 0–22)

## 2019-01-29 LAB — SARS CORONAVIRUS 2 BY RT PCR (HOSPITAL ORDER, PERFORMED IN ~~LOC~~ HOSPITAL LAB): SARS Coronavirus 2: POSITIVE — AB

## 2019-01-29 LAB — TROPONIN I: Troponin I: <0.03 ng/mL (ref <0.03)

## 2019-01-29 LAB — PROCALCITONIN: Procalcitonin: <0.1 ng/mL

## 2019-01-29 LAB — ABO/RH: ABO/RH(D): A NEG

## 2019-01-29 LAB — BRAIN NATRIURETIC PEPTIDE: B Natriuretic Peptide: 42.1 pg/mL (ref 0.0–100.0)

## 2019-01-29 LAB — C-REACTIVE PROTEIN: CRP: <0.8 mg/dL (ref <1.0)

## 2019-01-29 MED ORDER — ASPIRIN 300 MG RE SUPP
300.0000 mg | Freq: Every day | RECTAL | Status: DC
  Filled 2019-01-29 (×2): qty 1

## 2019-01-29 MED ORDER — ASPIRIN 325 MG PO TABS
325.0000 mg | ORAL_TABLET | Freq: Every day | ORAL | Status: DC
  Administered 2019-01-29 – 2019-01-30 (×2): 325 mg via ORAL
  Filled 2019-01-29 (×2): qty 1

## 2019-01-29 MED ORDER — CLOPIDOGREL BISULFATE 300 MG PO TABS
300.0000 mg | ORAL_TABLET | Freq: Once | ORAL | Status: DC
  Filled 2019-01-29: qty 1

## 2019-01-29 MED ORDER — CLOPIDOGREL BISULFATE 75 MG PO TABS
75.0000 mg | ORAL_TABLET | Freq: Every day | ORAL | Status: DC
  Administered 2019-01-30: 75 mg via ORAL
  Filled 2019-01-29 (×2): qty 1

## 2019-01-29 MED ORDER — ACETAMINOPHEN 325 MG PO TABS
650.0000 mg | ORAL_TABLET | ORAL | Status: DC | PRN
  Administered 2019-01-29: 650 mg via ORAL
  Filled 2019-01-29: qty 2

## 2019-01-29 MED ORDER — GADOBUTROL 1 MMOL/ML IV SOLN
5.0000 mL | Freq: Once | INTRAVENOUS | Status: AC | PRN
  Administered 2019-01-29: 5 mL via INTRAVENOUS

## 2019-01-29 MED ORDER — STROKE: EARLY STAGES OF RECOVERY BOOK
Freq: Once | Status: DC
  Filled 2019-01-29: qty 1

## 2019-01-29 MED ORDER — ACETAMINOPHEN 160 MG/5ML PO SOLN: 650.0000 mg | ORAL | Status: DC | PRN

## 2019-01-29 MED ORDER — MORPHINE SULFATE (PF) 2 MG/ML IV SOLN
0.5000 mg | INTRAVENOUS | Status: DC | PRN
  Administered 2019-01-29 – 2019-01-30 (×6): 0.5 mg via INTRAVENOUS
  Filled 2019-01-29 (×6): qty 1

## 2019-01-29 MED ORDER — ONDANSETRON HCL 4 MG/2ML IJ SOLN: 4.0000 mg | Freq: Four times a day (QID) | INTRAMUSCULAR | Status: DC | PRN

## 2019-01-29 MED ORDER — ACETAMINOPHEN 650 MG RE SUPP: 650.0000 mg | RECTAL | Status: DC | PRN

## 2019-01-29 MED ORDER — LORAZEPAM 2 MG/ML IJ SOLN
1.0000 mg | Freq: Four times a day (QID) | INTRAMUSCULAR | Status: DC | PRN
  Administered 2019-01-29: 1 mg via INTRAVENOUS
  Filled 2019-01-29: qty 1

## 2019-01-29 MED ORDER — NICOTINE 21 MG/24HR TD PT24: 21.0000 mg | MEDICATED_PATCH | Freq: Every day | TRANSDERMAL | Status: DC | PRN

## 2019-01-29 NOTE — Progress Notes (Signed)
Stroke Team Progress Note  History of Present Illness:( Dr Leonel Ramsay ) Debra Kidd is a 58 y.o. female with h/o lung cancer who presented to the emergency department with left-sided weakness that started on 5/25.  The patient states that she has been having some worsening of her left-sided weakness since that time and therefore finally presented to the emergency department at [facility] long  On 5/28 where was felt that she had had an ischemic stroke.  Due to this she was admitted to the hospital and on routine screening was found to have a positive COVID19.  She denies any of the usual symptoms of the coronavirus including cough, fevers, or known exposures.  Subjective Left sided weakness persists but may be slightly improved.  She is a heavy smoker but is willing to quit.  She is reluctant to take Plavix because she states her brother died on Plavix  MRI and MRA, lipids, hemoglobin A1c and echo pending  Physical Exam: Pleasant middle-age Caucasian lady who is not in distress.  She is in negative pressure room for COVID positive precautions. . Afebrile. Head is nontraumatic. Neck is supple without bruit.    Cardiac exam no murmur or gallop. Lungs are clear to auscultation. Distal pulses are well felt. Neurological Exam :  Pleasant awake alert oriented to time place and person.  Speech is clear without dysarthria or aphasia.  Follows commands well.  Extraocular movements are full range without nystagmus.  She has left lower facial weakness.  Tongue midline.  Motor system exam left hemiparesis with left leg greater than arm drift.  4/5 strength in left upper extremity but significant weakness of grip and intrinsic hand muscles.  moderate weakness of left hip flexors and ankle dorsiflexors.  Coordination impaired on the left.  Sensation appears preserved bilaterally.  Gait not tested. Assessment and Plan: Impression: 58 yo F with likely small right subcortical ischemic stroke in the setting of what appears  to be an asymptomatic coronavirus infection. Elevated D-dimer.  -MRI brain: -MRA head/neck:  -DAPT started after 300mg  Plavix load: ASA 81 + Plavix 75mg  daily I would favor dual antiplatelet therapy in the short-term -Smoking Cessation- patch given. 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  RECS/PLAN: - HgbA1c, fasting lipid panel pending - MRI, MRA  of the brain without contrast pending - Echocardiogram pending - Risk factor modification; education - Telemetry monitoring - PT consult, OT consult, NPO till Speech consult -I have personally obtained history,examined this patient, reviewed notes, independently viewed imaging studies, participated in medical decision making and plan of care.ROS completed by me personally and pertinent positives fully documented  I have made any additions or clarifications directly to the above note.  Long discussion with patient and with Dr. Florencia Reasons and answered questions.  Greater than 50% time during this 35-minute visit was spent on counseling and coordination of care about her stroke and counseling on smoking cessation and risk factor modification and answering questions  Antony Contras, MD Medical Director [facility] Pager: [phone] 01/29/2019 1:41 PM

## 2019-01-29 NOTE — H&P (Addendum)
TRH H&P    Patient Demographics:    Debra Kidd, is a 58 y.o. female  MRN: [id number]  DOB - 05-04-1961  Admit Date - 01/28/2019  Referring MD/NP/PA:   Sedonia Small  Outpatient Primary MD for the patient is Debra Roers, MD  Patient coming from:   home  Chief complaint-   Left sided weakness   HPI:    Debra Kidd  is a 58 y.o. female,  w h/o metastatic small cell lung cancer dx 2017 , hypertension, hyperlipidemia, apparently c/o left sided weakness of the arm and left since Monday.  Pt also noted slightly slurred speech and ? Left facial droop.  Pt came primarily due to the weakness.  Speech is fluent today.   In ED,  T 97.9 P 81 Bp 140/92  Pox 99% on RA  Wbc 9.5, Hgb 12.4, Plt 243 Bun 8, Creatinine 0.75   Na 138, K 2.9   Glucose 111  SARS + CXR pending  Urinalysis negative  Kcl 40 meq po x1 in ED  Spoke with Dr. Sedonia Small and requested that he consult neurology  Pt will be admitted for left sided weakness r/o CVA, vs metastatic disease, hypokalemia, and COVID +.        Review of systems:    In addition to the HPI above,  No Fever-chills, No Headache, No changes with Vision or hearing, No problems swallowing food or Liquids, No Chest pain, Cough or Shortness of Breath, No Abdominal pain, No Nausea or Vomiting, bowel movements are regular, No Blood in stool or Urine, No dysuria, No new skin rashes or bruises, No new joints pains-aches,   No recent weight gain or loss, No polyuria, polydypsia or polyphagia, No significant Mental Stressors.  All other systems reviewed and are negative.    Past History of the following :    Past Medical History:  Diagnosis Date  . 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 Blood type, Rh positive   . Cancer ([place])   . Chronic pain syndrome   . Depression 08/07/2016  . Encounter for antineoplastic chemotherapy 07/17/2016  . History of  external beam radiation therapy 11/19/16-12/02/16   brain 25 Gy in 10 fractions  . Hyperlipidemia   . Hypertension   . Hypertension 08/07/2016  . Osteoporosis   . Small cell lung cancer [facility])       Past Surgical History:  Procedure Laterality Date  . ABDOMINAL HYSTERECTOMY  1981  . APPENDECTOMY     at age 60  . EYE SURGERY     left  . IR GENERIC HISTORICAL  06/03/2016   IR FLUORO GUIDE PORT INSERTION RIGHT 06/03/2016 [facility]  . IR GENERIC HISTORICAL  06/03/2016   IR US GUIDE VASC ACCESS RIGHT 06/03/2016 [facility]  . SKIN CANCER EXCISION     basal cell carcinoma L side of face      Social History:      Social History   Tobacco Use  . Smoking status: Current Every Day Smoker  Packs/day: 1.00    Years: 43.00    Pack years: 43.00  . Smokeless tobacco: Never Used  Substance Use Topics  . Alcohol use: Not Currently    Comment: quit drinking beer 02/2016       Family History :     Family History  Problem Relation Age of Onset  . Heart disease Mother   . Heart disease Father   . Emphysema Brother   . Breast cancer Maternal Grandmother   . Heart disease Brother        Home Medications:   Prior to Admission medications   Medication Sig Start Date End Date Taking? Authorizing Provider  celecoxib (CELEBREX) 200 MG capsule Take 200 mg by mouth daily as needed for mild pain.    Yes [provider]  diazepam (VALIUM) 5 MG tablet Take 5 mg by mouth 3 (three) times daily as needed for anxiety.  03/24/18  Yes [provider]  diphenhydrAMINE (BENADRYL) 25 MG tablet Take 25 mg by mouth every 6 (six) hours as needed for itching or allergies.    Yes [provider]  lidocaine-prilocaine (EMLA) cream Apply 1 application topically as needed. Patient taking differently: Apply 1 application topically as needed (port).  01/19/19  Yes [name], MD  loperamide (IMODIUM A-D) 2 MG tablet Take 2 mg by mouth 4 (four) times daily as needed for  diarrhea or loose stools.   Yes [provider]  mirtazapine (REMERON) 15 MG tablet Take 1 tablet (15 mg total) by mouth at bedtime. 10/06/18  Yes Curcio, Roselie Awkward, NP  oxyCODONE-acetaminophen (PERCOCET) 5-325 MG tablet Take 1 tablet by mouth every 6 (six) hours as needed for severe pain. 12/29/18  Yes [name], MD  potassium chloride 20 MEQ/15ML (10%) SOLN Take 30 mLs (40 mEq total) by mouth daily. 11/17/18  Yes [name], MD  prochlorperazine (COMPAZINE) 10 MG tablet Take 1 tablet (10 mg total) by mouth every 6 (six) hours as needed for nausea or vomiting. 12/18/18  Yes Heilingoetter, Cassandra L, PA-C  traMADol (ULTRAM) 50 MG tablet Take 50 mg by mouth every 6 (six) hours as needed for moderate pain.    Yes [provider]  methocarbamol (ROBAXIN) 500 MG tablet Take 1 tablet (500 mg total) by mouth 3 (three) times daily. Patient not taking: Reported on 10/06/2018 08/27/18   Lavina Hamman, MD  polyethylene glycol [facility] / Floria Raveling) packet Take 17 g by mouth daily. Patient not taking: Reported on 10/06/2018 08/28/18   Lavina Hamman, MD  senna-docusate (SENOKOT-S) 8.6-50 MG tablet Take 2 tablets by mouth at bedtime. Patient not taking: Reported on 10/27/2018 08/27/18   Lavina Hamman, MD  Vitamin D, Ergocalciferol, (DRISDOL) 1.25 MG (50000 UT) CAPS capsule Take 1 capsule (50,000 Units total) by mouth every 7 (seven) days. Patient not taking: Reported on 01/28/2019 09/02/18   Lavina Hamman, MD     Allergies:     Allergies  Allergen Reactions  . Codeine Nausea And Vomiting  . Motrin [Ibuprofen]     Elevation of BP  . [name]     intolerant  . Vicodin [Hydrocodone-Acetaminophen] Nausea And Vomiting     Physical Exam:   Vitals  Blood pressure (!) 140/92, pulse 81, temperature 97.9 F (36.6 C), temperature source Oral, resp. rate 20, SpO2 99 %.  1.  General: axox3  2. Psychiatric: euthymic  3. Neurologic: cn2-12 intact, reflexes  2++  patellar reflex on left, and upgoing toe on the left,  motor 5-/5 left upper ext, 5-/5 left lower ext, otherwise 5/5 in the right upper and lower ext  4. HEENMT:  Anicteric, pupils, 1.18m symmetric, direct, consensual, near intact, eomi can't see if facial droop due to wearing mask Neck: no jvd  + portacath right upper chest  5. Respiratory : CTAB  6. Cardiovascular : rrr s1, s2   7. Gastrointestinal:  ABd: soft, nt, nd, +bs  8. Skin:  Ext: no c/c/e,  No rash  9.Musculoskeletal:  Good rom      Data Review:    CBC Recent Labs  Lab 01/28/19 2209  WBC 9.5  HGB 12.4  HCT 34.8*  PLT 243  MCV 113.7*  MCH 40.5*  MCHC 35.6  RDW 13.2   ------------------------------------------------------------------------------------------------------------------  Results for orders placed or performed during the hospital encounter of 01/28/19 (from the past 48 hour(s))  CBC     Status: Abnormal   Collection Time: 01/28/19 10:09 PM  Result Value Ref Range   WBC 9.5 4.0 - 10.5 K/uL   RBC 3.06 (L) 3.87 - 5.11 MIL/uL   Hemoglobin 12.4 12.0 - 15.0 g/dL   HCT 34.8 (L) 36.0 - 46.0 %   MCV 113.7 (H) 80.0 - 100.0 fL   MCH 40.5 (H) 26.0 - 34.0 pg   MCHC 35.6 30.0 - 36.0 g/dL   RDW 13.2 11.5 - 15.5 %   Platelets 243 150 - 400 K/uL   nRBC 0.0 0.0 - 0.2 %    Comment: Performed at [facility] [address] [address], [place] [id number] Basic metabolic panel     Status: Abnormal   Collection Time: 01/28/19 10:09 PM  Result Value Ref Range   Sodium 138 135 - 145 mmol/L   Potassium 2.9 (L) 3.5 - 5.1 mmol/L   Chloride 100 98 - 111 mmol/L   CO2 29 22 - 32 mmol/L   Glucose, Bld 111 (H) 70 - 99 mg/dL   BUN 8 6 - 20 mg/dL   Creatinine, Ser 0.75 0.44 - 1.00 mg/dL   Calcium 9.1 8.9 - 10.3 mg/dL   GFR calc non Af Amer >60 >60 mL/min   GFR calc Af Amer >60 >60 mL/min   Anion gap 9 5 - 15    Comment: Performed at [facility] [address],  [place] [id number] Urinalysis, Routine w reflex microscopic     Status: Abnormal   Collection Time: 01/28/19 10:09 PM  Result Value Ref Range   Color, Urine STRAW (A) YELLOW   APPearance CLEAR CLEAR   Specific Gravity, Urine 1.003 (L) 1.005 - 1.030   pH 6.0 5.0 - 8.0   Glucose, UA NEGATIVE NEGATIVE mg/dL   Hgb urine dipstick SMALL (A) NEGATIVE   Bilirubin Urine NEGATIVE NEGATIVE   Ketones, ur NEGATIVE NEGATIVE mg/dL   Protein, ur NEGATIVE NEGATIVE mg/dL   Nitrite NEGATIVE NEGATIVE   Leukocytes,Ua NEGATIVE NEGATIVE   RBC / HPF 0-5 0 - 5 RBC/hpf   WBC, UA 0-5 0 - 5 WBC/hpf   Bacteria, UA RARE (A) NONE SEEN   Squamous Epithelial / LPF 0-5 0 - 5    Comment: Performed at [facility] [address] [address], [place] [address] SARS Coronavirus 2 (CEPHEID - [facility] Hosp Order     Status: Abnormal   Collection Time: 01/28/19 10:09 PM  Result Value Ref Range   SARS Coronavirus 2 POSITIVE (A) NEGATIVE    Comment:  RESULT CALLED TO, READ BACK BY AND VERIFIED WITH:  Amalia Greenhouse RN [id number] 01/29/19 A NAVARRO (NOTE) If result is NEGATIVE SARS-CoV-2 target nucleic acids are NOT DETECTED. The SARS-CoV-2 RNA is generally detectable in upper and lower  respiratory specimens during the acute phase of  infection. The lowest  concentration of SARS-CoV-2 viral copies this assay can detect is 250  copies / mL. A negative result does not preclude SARS-CoV-2 infection  and should not be used as the sole basis for treatment or other  patient management decisions.  A negative result may occur with  improper specimen collection / handling, submission of specimen other  than nasopharyngeal swab, presence of viral mutation(s) within the  areas targeted by this assay, and inadequate number of viral copies  (<250 copies / mL). A negative result must be combined with clinical  observations, patient history, and epidemiological information. If result is POSITIVE SARS-CoV-2 target nucleic acids are DETECTED. The SARS-CoV-2 RNA is generally detectable in upper and lower  respiratory specimens during the acute phase of infection.  Positive  results are indicative of active infection  with SARS-CoV-2.  Clinical  correlation with patient history and other diagnostic information is  nece ssary to determine patient infection status.  Positive results do  not rule out bacterial infection or co-infection with other viruses. If result is PRESUMPTIVE POSTIVE SARS-CoV-2 nucleic acids MAY BE PRESENT.   A presumptive positive result was obtained on the submitted specimen  and confirmed on repeat testing.  While 2019 novel coronavirus  (SARS-CoV-2) nucleic acids may be present in the submitted sample  additional confirmatory testing may be necessary for epidemiological  and / or clinical management purposes  to differentiate between  SARS-CoV-2 and other Sarbecovirus currently known to infect humans.  If clinically indicated additional testing with an alternate test  methodology [id number]) is advised. The SARS-CoV-2 RNA is generally  detectable in upper and lower respiratory specimens during the acute  phase of infection. The expected result is Negative. Fact Sheet for Patients:  StrictlyIdeas.no Fact Sheet for Healthcare Providers: http InApartments.fi This test is not yet approved or cleared by the [place] FDA and has been authorized for detection and/or diagnosis of SARS-CoV-2 by FDA under an Emergency Use Authorization (EUA).  This EUA will remain in effect (meaning this test can be used) for the duration of the COVID-19 declaration under Section 564(b)(1) of the Act, 21 U.S.C. section 360bbb-3(b)(1), unless the authorization is terminated or revoked sooner. Performed at [facility], [address] [place], [place] [address]     Chemistries  Recent Labs  Lab 01/28/19 2209  NA 138  K 2.9*  CL 100  CO2 29  GLUCOSE 111*  BUN 8  CREATININE 0.75  CALCIUM 9.1   ------------------------------------------------------------------------------------------------------------------   ------------------------------------------------------------------------------------------------------------------ GFR: Estimated Creatinine Clearance: 62.6 mL/min (by C-G formula based on SCr of 0.75 mg/dL). Liver Function Tests: No results for input(s): AST, ALT, ALKPHOS, BILITOT, PROT, ALBUMIN in the last 168 hours. No results for input(s): LIPASE, AMYLASE in the last 168 hours. No results for input(s): AMMONIA in the last 168 hours. Coagulation Profile: No results for input(s): INR, PROTIME in the last 168 hours. Cardiac Enzymes: No results for input(s): CKTOTAL, CKMB, CKMBINDEX, TROPONINI in the last 168 hours. BNP (last 3 results) No results for input(s): PROBNP in the last 8760 hours. HbA1C: No results for input(s): HGBA1C in the last 72 hours. CBG: No results for input(s): GLUCAP in the last 168 hours. Lipid Profile: No results for input(s): CHOL, HDL, LDLCALC, TRIG, CHOLHDL, LDLDIRECT  in the last 72 hours. Thyroid Function Tests: No results for input(s): TSH, T4TOTAL, FREET4, T3FREE, THYROIDAB in the last 72 hours. Anemia Panel: No results for input(s): VITAMINB12, FOLATE, FERRITIN, TIBC, IRON, RETICCTPCT in the last 72 hours.  --------------------------------------------------------------------------------------------------------------- Urine analysis:    Component Value Date/Time   COLORURINE STRAW (A) 01/28/2019 2209   APPEARANCEUR CLEAR 01/28/2019 2209   LABSPEC 1.003 (L) 01/28/2019 2209   LABSPEC 1.010 08/21/2016 1227   PHURINE 6.0 01/28/2019 2209   GLUCOSEU NEGATIVE 01/28/2019 2209   GLUCOSEU Negative 08/21/2016 1227   HGBUR SMALL (A) 01/28/2019 2209   BILIRUBINUR NEGATIVE 01/28/2019 2209   BILIRUBINUR Negative 08/21/2016 1227   KETONESUR NEGATIVE 01/28/2019 2209   PROTEINUR NEGATIVE 01/28/2019 2209   UROBILINOGEN 0.2 08/21/2016 1227   NITRITE NEGATIVE 01/28/2019 2209   LEUKOCYTESUR NEGATIVE 01/28/2019 2209   LEUKOCYTESUR Negative 08/21/2016 1227       Imaging Results:    Ct Head Wo Contrast  Result Date: 01/29/2019 CLINICAL DATA:  Fall several days ago with left-sided numbness and weakness, initial encounter EXAM: CT HEAD WITHOUT CONTRAST TECHNIQUE: Contiguous axial images were obtained from the base of the skull through the vertex without intravenous contrast. COMPARISON:  11/07/2016 FINDINGS: Brain: Mild atrophic and chronic white matter ischemic changes are noted. No findings to suggest acute hemorrhage, acute infarction or space-occupying mass lesion are noted. Vascular: No hyperdense vessel or unexpected calcification. Skull: Normal. Negative for fracture or focal lesion. Sinuses/Orbits: No acute finding. Other: None. IMPRESSION: Mild atrophic and chronic white matter ischemic changes. No acute abnormality noted. Electronically Signed   By: Inez Catalina M.D.   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 BSedonia Smallconsult neurology, It is unclear if he did this Please call neurology in AM  COVID positive Droplet and contact precautions Check crp, esr, cpk, lft, d dimer, IL-6 CXR Pt has normal pox which is good prognostically  Hypokalemia Replete Check cmp in am Check magnesium in am  Chronic pain Percocet=>Morphine 0.532miv q6h prn  Zofran 30m36mv q6h prn n/v  Anxiety Valium=> ativan 1mg33m q6h prn    DVT Prophylaxis-   Lovenox - SCDs  AM Labs Ordered, also please review Full Orders  Family Communication:  Admission, patients condition and plan of care including tests being ordered have been discussed with the patient  who indicate understanding and agree with the plan and Code Status.  Code Status:  FULL CODE  Admission status: Inpatient: Based on patients clinical presentation and evaluation of above clinical data, I have made determination that patient meets Inpatient criteria at this time.  Pt wil require evaluation of left sided weakness, CVA vs metastatic disease.  In light of her covid+ status, w age, and cancer being significant risk factors,  it will take longer to w/up her left sided weakness and will likely need inpatient stay.    Time spent in minutes : 70 minutes   JameJani Gravel on  01/29/2019 at 2:15 AM

## 2019-01-29 NOTE — Progress Notes (Signed)
Rehab Admissions Coordinator Note:  Patient was screened by Cleatrice Burke for appropriateness for an Inpatient Acute Rehab Consult OT eval. Note that pt's most recent (+) test was on 01/28/2019.  Will continue to follow at a distance until patient meets current CDC guidelines for discharge to a facility. There are SNF facilities in the area accepting COVID + patients at this time if unable to d/c home with her deficits.   Cleatrice Burke RN MSN 01/29/2019, 3:24 PM  I can be reached at [phone].

## 2019-01-29 NOTE — Progress Notes (Signed)
Report received from [facility] ER RN. Room ready.

## 2019-01-29 NOTE — Progress Notes (Addendum)
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)   Hypertension   Hypokalemia due to persistent hypomagnesemia   Stroke [facility])   COVID-19 virus infection  Acute CVA Neurology consulted, case discussed with neurology Dr Leonie Man who recommend asa/plavix three weeks then single antiplatelet after Patient has left hemiparesis, OT recommended CIR placement but this is hindered by she is SARS COV2 +   SARS Cov2 positive, she does not appear to have symptom Droplet and contact precautions Mildly elevated esr at 68, d dimer at 0.6, fibirnogen at 485, ferrintin 712,  Crp/ldh  Unremarkable, troponin negative IL-6 in process CXR "Bibasilar atelectasis. Chronic changes in the right apex."  Hypokalemia, replaced , will repeat test in am, will check mag   metastatic small cell lung cancer dx 2017  Reports currently getting immunotherapy Followed by Dr Julien Nordmann  Dr Julien Nordmann alerted regarding the new cva and covid testing result through epic.  Chronic pain Percocet=>Morphine 0.57m iv q6h prn  Zofran 4835miv q6h prn n/v  Anxiety Valium=> ativan 35m19mv q6h prn   Code Status: full  Family Communication: patient   Disposition Plan: home , likely in am She is not able to get cir placement or home health due to + covid,  Patient declined snf or home health, she states she has everything she need at home, her two sons and her husband can help her.   Consultants:  neurology  Procedures:  none  Antibiotics:  none    Objective: BP 140/90 (BP Location: Left Arm)   Pulse 86   Temp 98.1 F (36.7 C) (Oral)   Resp 16   Ht '5\' 3"'  (1.6 m)   Wt 57.3 kg   SpO2 94%   BMI 22.38 kg/m   Intake/Output Summary (Last 24 hours) at 01/29/2019 1508 Last data filed at 01/29/2019 0600 Gross per 24 hour  Intake 0 ml  Output 0 ml  Net 0 ml   Filed Weights   01/29/19 0559  Weight: 57.3 kg    Exam: Patient is examined daily including today on 01/29/2019, exams remain the same as of yesterday except that has changed    General:  NAD  Cardiovascular: RRR  Respiratory: CTABL  Abdomen: Soft/ND/NT, positive BS  Musculoskeletal: No Edema  Neuro: alert, oriented   Data Reviewed: Basic Metabolic Panel: Recent Labs  Lab 01/28/19 2209  NA 138  K 2.9*  CL 100  CO2 29  GLUCOSE 111*  BUN 8  CREATININE 0.75  CALCIUM 9.1   Liver Function Tests: No results for input(s): AST, ALT, ALKPHOS, BILITOT, PROT, ALBUMIN in the last 168 hours. No results for input(s): LIPASE, AMYLASE in the last 168 hours. No results for input(s): AMMONIA in the last 168 hours. CBC: Recent Labs  Lab 01/28/19 2209  WBC 9.5  HGB 12.4  HCT 34.8*  MCV 113.7*  PLT 243   Cardiac Enzymes:   Recent Labs  Lab 01/29/19 0233  TROPONINI <0.03   BNP (last 3 results) Recent Labs    01/29/19 0233  BNP 42.1    ProBNP (last 3 results) No results for input(s): PROBNP in the last 8760 hours.  CBG: No results for input(s): GLUCAP in the last 168 hours.  Recent Results (from the past 240 hour(s))  SARS Coronavirus 2 (CEPHEID - Performed in [facility] hospital lab), Hosp Order     Status: Abnormal   Collection Time: 01/28/19 10:09 PM  Result Value Ref Range Status   SARS Coronavirus 2 POSITIVE (A) NEGATIVE Final    Comment: RESULT CALLED TO, READ BACK BY AND VERIFIED WITH:  Amalia Greenhouse RN [id number] 01/29/19 A NAVARRO (NOTE) If result is NEGATIVE SARS-CoV-2 target nucleic acids are NOT DETECTED. The SARS-CoV-2 RNA is generally detectable in upper and lower  respiratory specimens during the acute phase of  infection. The lowest  concentration of SARS-CoV-2 viral copies this assay can detect is 250  copies / mL. A negative result does not preclude  SARS-CoV-2 infection  and should not be used as the sole basis for treatment or other  patient management decisions.  A negative result may occur with  improper specimen collection / handling, submission of specimen other  than nasopharyngeal swab, presence of viral mutation(s) within the  areas targeted by this assay, and inadequate number of viral copies  (<250 copies / mL). A negative result must be combined with clinical  observations, patient history, and epidemiological information. If result is POSITIVE SARS-CoV-2 target nucleic acids are DETECTED. The SARS-CoV-2 RNA is generally detectable in upper and lower  respiratory specimens during the acute phase of infection.  Positive  results are indicative of active infection with SARS-CoV-2.  Clinical  correlation with patient history and other diagnostic information is  nece ssary to determine patient infection status.  Positive results do  not rule out bacterial infection or co-infection with other viruses. If result is PRESUMPTIVE POSTIVE SARS-CoV-2 nucleic acids MAY BE PRESENT.   A presumptive positive result was obtained on the submitted specimen  and confirmed on repeat testing.  While 2019 novel coronavirus  (SARS-CoV-2) nucleic acids may be present in the submitted sample  additional confirmatory testing may be necessary for epidemiological  and / or clinical management purposes  to differentiate between  SARS-CoV-2 and other Sarbecovirus currently known to infect humans.  If clinically indicated additional testing with an alternate test  methodology [phone]) is advised. The SARS-CoV-2 RNA is generally  detectable in upper and lower respiratory specimens during the acute  phase of infection. The expected result is Negative. Fact Sheet for Patients:  StrictlyIdeas.no Fact Sheet for Healthcare Providers: http InApartments.fi This test is not yet approved or cleared by the  [place] FDA and has been authorized for detection and/or diagnosis of SARS-CoV-2 by FDA under an Emergency Use Authorization (EUA).  This EUA will remain in effect (meaning this test can be used) for the duration of the COVID-19 declaration under Section 564(b)(1) of the Act, 21 U.S.C. section 360bbb-3(b)(1), unless the authorization is terminated or revoked sooner. Performed at [facility], [place] [address] [place], [place] [address]      Studies: Ct Head Wo Contrast  Result Date: 01/29/2019 CLINICAL DATA:  Fall several days ago with left-sided numbness and weakness, initial encounter EXAM: CT HEAD WITHOUT CONTRAST TECHNIQUE: Contiguous axial images were obtained from the base of the skull through the vertex without intravenous contrast. COMPARISON:  11/07/2016 FINDINGS: Brain: Mild atrophic and chronic white matter ischemic changes are noted. No findings to suggest acute hemorrhage, acute infarction or space-occupying mass lesion are noted. Vascular: No hyperdense vessel or unexpected calcification. Skull: Normal. Negative for fracture or focal lesion. Sinuses/Orbits: No acute finding. Other: None. IMPRESSION: Mild atrophic and chronic white matter ischemic changes. No acute abnormality noted. Electronically Signed   By: Inez Catalina M.D.   On: 01/29/2019 00:08    Scheduled Meds: .  stroke: mapping our early stages of recovery book   Does not apply Once  . aspirin  300 mg Rectal Daily   Or  . aspirin  325 mg Oral Daily  . clopidogrel  300 mg Oral Once  . [START ON 01/30/2019] clopidogrel  75 mg Oral Daily    Continuous Infusions:   Time spent: 35 mins I have personally reviewed and interpreted on  01/29/2019 daily labs, tele strips, imagings as discussed above under date review session and assessment and plans.  I reviewed all nursing notes, pharmacy notes, consultant notes,  vitals, pertinent old records  I have discussed plan of care as described above  with RN , patient on 01/29/2019   Florencia Reasons MD, PhD  [facility] Pager [phone]. If 7PM-7AM, please contact night-coverage at [email or web address], password [facility] 01/29/2019, 3:08 PM  LOS: 0 days

## 2019-01-29 NOTE — Evaluation (Signed)
Occupational Therapy Evaluation Patient Details Name: Debra Kidd MRN: [id number] DOB: 07-22-1961 Today's Date: 01/29/2019    History of Present Illness 58 y.o.femalewith h/o lung cancer, HTN, chronic pain syndrome who presented to the ED with left-sided weakness that started on 5/25. Pt admitted to the hospital and on routine screening was found to have a positive COVID19. CT head without acute changes; MRI brain and head/neck pending.    Clinical Impression   This 58 y/o female presents with the above. PTA pt reports independence with ADL, functional mobility and some iADL including driving. Pt presents supine in bed agreeable to therapy session; presenting with L side weakness including decreased fine motor/coordination in dominant LUE, reports of visual deficits, decreased standing balance all impacting her functional performance. Pt requiring minA for sit<>stand, modA to take side steps along EOB due to LLE instability (use of HHA). She currently requires minA for seated UB ADL, mod-maxA for LB ADL. Pt reports she has good family support at home from spouse and children who can assist with ADL/iADL PRN after discharge. She will benefit from continued acute OT services and feel she is appropriate for CIR level therapy services at time of discharge to maximize her safety and independence with ADL and mobility prior to return home. Will follow.     Follow Up Recommendations  CIR;Supervision/Assistance - 24 hour    Equipment Recommendations  None recommended by OT;Other (comment)(TBD)           Precautions / Restrictions Precautions Precautions: Fall Precaution Comments: L side weakness Restrictions Weight Bearing Restrictions: No      Mobility Bed Mobility Overal bed mobility: Needs Assistance Bed Mobility: Supine to Sit;Sit to Supine     Supine to sit: Min guard;HOB elevated Sit to supine: Min guard;HOB elevated   General bed mobility comments: minguard for safety, no  physical assist required, increased time  Transfers Overall transfer level: Needs assistance Equipment used: None;1 person hand held assist Transfers: Sit to/from Stand Sit to Stand: Min assist         General transfer comment: minA to rise and steady from EOB, use of face to face method and HHA for increased stability; pt with L knee instability noted with wt shifting    Balance Overall balance assessment: Needs assistance Sitting-balance support: Feet supported Sitting balance-Leahy Scale: Good     Standing balance support: Bilateral upper extremity supported Standing balance-Leahy Scale: Poor Standing balance comment: reliant on external assist/UE support                           ADL either performed or assessed with clinical judgement   ADL Overall ADL's : Needs assistance/impaired Eating/Feeding: NPO   Grooming: Set up;Min guard;Sitting   Upper Body Bathing: Minimal assistance;Sitting   Lower Body Bathing: Moderate assistance;Sit to/from stand   Upper Body Dressing : Minimal assistance;Sitting   Lower Body Dressing: Moderate assistance;Sit to/from stand       Toileting- Water quality scientist and Hygiene: Maximal assistance;Sit to/from stand Toileting - Clothing Manipulation Details (indicate cue type and reason): minA static standing balance     Functional mobility during ADLs: Moderate assistance(HHA) General ADL Comments: pt able to stand and take side steps along EOB today with modA due to LLE instability     Vision Baseline Vision/History: Wears glasses Wears Glasses: Reading only Patient Visual Report: Other (comment)(reports floaters ) Vision Assessment?: Yes Eye Alignment: Within Functional Limits Ocular Range of Motion: Within Functional Limits  Alignment/Gaze Preference: Within Defined Limits Tracking/Visual Pursuits: Able to track stimulus in all quads without difficulty Visual Fields: No apparent deficits Additional Comments: will  continue to assess in functional context     Perception     Praxis      Pertinent Vitals/Pain Pain Assessment: Faces Faces Pain Scale: Hurts little more Pain Location: back, generalized (chronic pain) Pain Descriptors / Indicators: Grimacing;Discomfort;Sore Pain Intervention(s): Monitored during session;Repositioned     Hand Dominance Left   Extremity/Trunk Assessment Upper Extremity Assessment Upper Extremity Assessment: LUE deficits/detail;RUE deficits/detail RUE Deficits / Details: decreased AROM due to baseline chronic pain RUE Coordination: decreased gross motor LUE Deficits / Details: grossly 3-/5 throughout, decreased fine motor, endorses mild decrease in sensation throughout hand and forearm LUE Sensation: decreased light touch LUE Coordination: decreased fine motor;decreased gross motor   Lower Extremity Assessment Lower Extremity Assessment: Defer to PT evaluation       Communication Communication Communication: No difficulties   Cognition Arousal/Alertness: Awake/alert Behavior During Therapy: WFL for tasks assessed/performed Overall Cognitive Status: No family/caregiver present to determine baseline cognitive functioning                                 General Comments: pt reports some mild confusion compared to her baseline, overall appears [facility] for basic tasks today; will continue to assess   General Comments       Exercises     Shoulder Instructions      Home Living Family/patient expects to be discharged to:: Private residence Living Arrangements: Spouse/significant other;Children Available Help at Discharge: Family;Available 24 hours/day Type of Home: House Home Access: Level entry     Home Layout: One level(one small ledge into kitchen)     Bathroom Shower/Tub: Teacher, early years/pre: Standard     Home Equipment: Clinical cytogeneticist - 2 wheels;Bedside commode          Prior Functioning/Environment Level of  Independence: Independent        Comments: drives, performing some cooking        OT Problem List: Decreased strength;Decreased range of motion;Decreased activity tolerance;Impaired balance (sitting and/or standing);Decreased cognition;Decreased coordination;Impaired vision/perception;Decreased safety awareness;Decreased knowledge of use of DME or AE;Impaired UE functional use;Impaired sensation      OT Treatment/Interventions: Self-care/ADL training;Therapeutic exercise;Energy conservation;Therapeutic activities;Cognitive remediation/compensation;Visual/perceptual remediation/compensation;Balance training;Patient/family education;DME and/or AE instruction;Neuromuscular education    OT Goals(Current goals can be found in the care plan section) Acute Rehab OT Goals Patient Stated Goal: regain her independence OT Goal Formulation: With patient Time For Goal Achievement: 02/12/19 Potential to Achieve Goals: Good  OT Frequency: Min 3X/week   Barriers to D/C:            Co-evaluation              AM-PAC OT "6 Clicks" Daily Activity     Outcome Measure Help from another person eating meals?: A Little Help from another person taking care of personal grooming?: A Lot Help from another person toileting, which includes using toliet, bedpan, or urinal?: A Lot Help from another person bathing (including washing, rinsing, drying)?: A Lot Help from another person to put on and taking off regular upper body clothing?: A Little Help from another person to put on and taking off regular lower body clothing?: A Lot 6 Click Score: 14   End of Session Nurse Communication: Mobility status  Activity Tolerance: Patient tolerated treatment well Patient left: in bed;with call  bell/phone within reach;with bed alarm set  OT Visit Diagnosis: Muscle weakness (generalized) (M62.81);Other abnormalities of gait and mobility (R26.89);Hemiplegia and hemiparesis Hemiplegia - Right/Left: Left Hemiplegia  - dominant/non-dominant: Dominant                Time: 4715-8063 OT Time Calculation (min): 22 min Charges:  OT General Charges $OT Visit: 1 Visit OT Evaluation $OT Eval Moderate Complexity: [place], OT [employer] Pager [phone] Office [place] 01/29/2019, 2:26 PM

## 2019-01-29 NOTE — Consult Note (Addendum)
Virtual Visit via Video Note  I connected with Debra Kidd on 5/29 at  by a video enabled telemedicine application and verified that I am speaking with the correct person using two identifiers.  Location: Patient: 2w34 Provider: [facility]   I discussed the limitations of evaluation and management by telemedicine and the availability of in person appointments. The patient expressed understanding and agreed to proceed.  Neurology Consultation Reason for Consult: Stroke Referring Physician: Georges Mouse  CC: Left sided weakness  History is obtained from:patient  HPI: Debra Kidd is a 58 y.o. female who presented to the emergency department with left-sided weakness that is been ongoing since Monday.  The patient states that she has been having some worsening of her left-sided weakness since that time and therefore presented to the emergency department at [facility] long where was felt that she had had an ischemic stroke.  Due to this she was admitted to the hospital and on routine screening was found to have a positive coronavirus test.  She denies any symptoms of the coronavirus including cough, fevers, or known exposures.   LKW: Monday tpa given?: no, outside of window.  NIHSS: 6(1 for left arm drift, 2 for left leg drift, 1 for facial weakness, and 1 for sensory).     ROS: A 14 point ROS was performed and is negative except as noted in the HPI.  Past Medical History:  Diagnosis Date  . 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 Blood type, Rh positive   . Cancer ([place])   . Chronic pain syndrome   . Depression 08/07/2016  . Encounter for antineoplastic chemotherapy 07/17/2016  . History of external beam radiation therapy 11/19/16-12/02/16   brain 25 Gy in 10 fractions  . Hyperlipidemia   . Hypertension   . Hypertension 08/07/2016  . Osteoporosis   . Small cell lung cancer (HCC)      Family History  Problem Relation Age of Onset  . Heart disease Mother   . Heart disease Father    . Emphysema Brother   . Breast cancer Maternal Grandmother   . Heart disease Brother      Social History:  reports that she has been smoking. She has a 43.00 pack-year smoking history. She has never used smokeless tobacco. She reports previous alcohol use. She reports current drug use. Frequency: 7.00 times per week. Drug: Marijuana.   Exam: Current vital signs: BP 131/81 (BP Location: Left Arm)   Pulse 85   Temp 98.4 F (36.9 C) (Oral)   Resp 20   Ht 5\' 3"  (1.6 m)   Wt 57.3 kg   SpO2 97%   BMI 22.38 kg/m  Vital signs in last 24 hours: Temp:  [97.9 F (36.6 C)-98.4 F (36.9 C)] 98.4 F (36.9 C) (05/29 0559) Pulse Rate:  [81-92] 85 (05/29 0559) Resp:  [16-20] 20 (05/29 0559) BP: (127-160)/(81-113) 131/81 (05/29 0559) SpO2:  [97 %-99 %] 97 % (05/29 0559) Weight:  [57.3 kg] 57.3 kg (05/29 0559)   Physical Exam  Constitutional: Appears well-developed and well-nourished.  Psych: Affect appropriate to situation Eyes: No scleral injection HENT: No OP obstrucion Head: Normocephalic.  Respiratory: Effort normal, non-labored breathing Skin: WDI  Neuro: Mental Status: Patient is awake, alert, oriented to person, place, month, year, and situation. Patient is able to give a clear and coherent history. No signs of aphasia or neglect Cranial Nerves: II: Visual Fields are full.  III,IV, VI: EOMI without ptosis  or diploplia.  V: Facial sensation is symmetric to touch VII: Facial movement  VIII: hearing is intact to voice X: Uvula elevates symmetrically XI: Shoulder shrug is symmetric. XII: tongue is midline without atrophy or fasciculations.  Motor: Tone is normal. Bulk is normal. 5/5 strength was present on the right side, she has some effort against gravity on the left, but is not able to sustain the arm out of bed for 10 seconds, the leg appears slightly weaker than the arm Sensory: Sensation is symmetric to light touch and temperature in the arms and legs. She doe snot  extinguish to DSS Cerebellar: Consistent with weakness on left.    I have reviewed labs in epic and the results pertinent to this consultation are: D-dimer - borderline elevated.   I have reviewed the images obtained: CT head - negative.   Impression: 58 yo F with likely small subcortical ischemic stroke in the setting of what appears to be an asymptomatic coronavirus infection.  I would favor dual antiplatelet therapy in the short-term and she will need MRI as well as MRA head and neck.  I discussed smoking cessation with the patient and that this would likely significantly reduce her chance of recurrent stroke down the line, however despite being diagnosed with small cell lung cancer, the patient indicates that she is not likely to quit.  She does request a nicotine patch, however I am concerned about worsening stroke symptoms and would avoid this vasoconstrictive agent at this time.  Recommendations: - HgbA1c, fasting lipid panel - MRI, MRA  of the brain without contrast - Frequent neuro checks - Echocardiogram - Carotid dopplers - Prophylactic therapy-Antiplatelet med: Aspirin - 81 mg and plavix 75mg  daily following 300mg  load.  - Risk factor modification - Telemetry monitoring - PT consult, OT consult, Speech consult - Stroke team to follow    Roland Rack, MD [facility] [phone]  If 7pm- 7am, please page neurology on call as listed in [place].    I discussed the assessment and treatment plan with the patient. The patient was provided an opportunity to ask questions and all were answered. The patient agreed with the plan and demonstrated an understanding of the instructions.   I provided 55 minutes of non-face-to-face time during this encounter.   Roland Rack, MD

## 2019-01-29 NOTE — Evaluation (Signed)
Physical Therapy Evaluation Patient Details Name: Debra Kidd MRN: [id number] DOB: [birth year] Today's Date: 01/29/2019   History of Present Illness  58 y.o.femalewith h/o lung cancer, HTN, chronic pain syndrome who presented to the ED with left-sided weakness that started on 5/25. Pt admitted to the hospital and on routine screening was found to have a positive COVID19. CT head without acute changes; MRI brain and head/neck pending.     Clinical Impression  Pt admitted with above diagnosis. Pt currently with functional limitations due to the deficits listed below (see PT Problem List). PTA, pt independent with all mobility living at home with family members. Today, patient with L sided weakness 3+/5 LLE, no apparent cognitive deficits. Pt states she wants to go straight home, educated on benefits of post acute rehab. Pt ambulating with mod A for stability.   Pt will benefit from skilled PT to increase their independence and safety with mobility to allow discharge to the venue listed below.       Follow Up Recommendations CIR    Equipment Recommendations  (TBD)    Recommendations for Other Services       Precautions / Restrictions Precautions Precautions: Fall Precaution Comments: L side weakness Restrictions Weight Bearing Restrictions: No      Mobility  Bed Mobility Overal bed mobility: Needs Assistance Bed Mobility: Supine to Sit;Sit to Supine     Supine to sit: Min guard;HOB elevated Sit to supine: Min guard;HOB elevated   General bed mobility comments: minguard for safety, no physical assist required, increased time  Transfers Overall transfer level: Needs assistance Equipment used: None;1 person hand held assist Transfers: Sit to/from Stand Sit to Stand: Min assist         General transfer comment: minA to rise and steady from EOB, use of face to face method and HHA for increased stability; pt with L knee instability noted with wt  shifting  Ambulation/Gait    LOB x2 walking 20' with HHA and min to mod A to stabilize.               Stairs            Wheelchair Mobility    Modified Rankin (Stroke Patients Only)       Balance Overall balance assessment: Needs assistance Sitting-balance support: Feet supported Sitting balance-Leahy Scale: Good     Standing balance support: Bilateral upper extremity supported Standing balance-Leahy Scale: Poor Standing balance comment: reliant on external assist/UE support                             Pertinent Vitals/Pain Faces Pain Scale: Hurts little more Pain Location: back, generalized (chronic pain) Pain Descriptors / Indicators: Grimacing;Discomfort;Sore    Home Living Family/patient expects to be discharged to:: Private residence Living Arrangements: Spouse/significant other;Children Available Help at Discharge: Family;Available 24 hours/day Type of Home: House Home Access: Level entry     Home Layout: One level(one small ledge into kitchen) Home Equipment: Shower seat;Walker - 2 wheels;Bedside commode      Prior Function Level of Independence: Independent         Comments: drives, performing some cooking     Hand Dominance   Dominant Hand: Left    Extremity/Trunk Assessment   Upper Extremity Assessment RUE Deficits / Details: decreased AROM due to baseline chronic pain RUE Coordination: decreased gross motor LUE Deficits / Details: grossly 3-/5 throughout, decreased fine motor, endorses mild decrease in sensation throughout  hand and forearm LUE Sensation: decreased light touch LUE Coordination: decreased fine motor;decreased gross motor            Communication   Communication: No difficulties  Cognition Arousal/Alertness: Awake/alert Behavior During Therapy: WFL for tasks assessed/performed Overall Cognitive Status: No family/caregiver present to determine baseline cognitive functioning                                  General Comments: pt reports some mild confusion compared to her baseline, overall appears [facility] for basic tasks today; will continue to assess      General Comments      Exercises     Assessment/Plan    PT Assessment Patient needs continued PT services  PT Problem List Decreased strength       PT Treatment Interventions DME instruction;Gait training;Stair training;Functional mobility training;Therapeutic activities;Therapeutic exercise;Balance training    PT Goals (Current goals can be found in the Care Plan section)  Acute Rehab PT Goals Patient Stated Goal: go home PT Goal Formulation: With patient Time For Goal Achievement: 02/12/19 Potential to Achieve Goals: Good    Frequency Min 4X/week   Barriers to discharge        Co-evaluation               AM-PAC PT "6 Clicks" Mobility  Outcome Measure Help needed turning from your back to your side while in a flat bed without using bedrails?: A Little Help needed moving from lying on your back to sitting on the side of a flat bed without using bedrails?: A Little Help needed moving to and from a bed to a chair (including a wheelchair)?: A Lot Help needed standing up from a chair using your arms (e.g., wheelchair or bedside chair)?: A Lot Help needed to walk in hospital room?: Total Help needed climbing 3-5 steps with a railing? : Total 6 Click Score: 12    End of Session Equipment Utilized During Treatment: Gait belt Activity Tolerance: Patient tolerated treatment well Patient left: in bed Nurse Communication: Mobility status PT Visit Diagnosis: Unsteadiness on feet (R26.81)    Time: 9935-7017 PT Time Calculation (min) (ACUTE ONLY): 33 min   Charges:   PT Evaluation $PT Eval Moderate Complexity: 1 Mod PT Treatments $Gait Training: 8-22 mins       Reinaldo Berber, PT, DPT Acute Rehabilitation Services Pager: [phone] Office: [place] 01/29/2019, 5:15  PM

## 2019-01-30 ENCOUNTER — Inpatient Hospital Stay (HOSPITAL_COMMUNITY): Payer: Medicare Other

## 2019-01-30 DIAGNOSIS — I639 Cerebral infarction, unspecified: Secondary | ICD-10-CM

## 2019-01-30 DIAGNOSIS — C3491 Malignant neoplasm of unspecified part of right bronchus or lung: Secondary | ICD-10-CM

## 2019-01-30 LAB — LIPID PANEL
Cholesterol: 241 mg/dL — ABNORMAL HIGH (ref 0–200)
HDL: 32 mg/dL — ABNORMAL LOW (ref >40)
LDL Cholesterol: 172 mg/dL — ABNORMAL HIGH (ref 0–99)
Total CHOL/HDL Ratio: 7.5 RATIO
Triglycerides: 187 mg/dL — ABNORMAL HIGH (ref <150)
VLDL: 37 mg/dL (ref 0–40)

## 2019-01-30 LAB — CBC WITH DIFFERENTIAL/PLATELET
Abs Immature Granulocytes: 0.03 10*3/uL (ref 0.00–0.07)
Basophils Absolute: 0 10*3/uL (ref 0.0–0.1)
Basophils Relative: 0 %
Eosinophils Absolute: 0.1 10*3/uL (ref 0.0–0.5)
Eosinophils Relative: 1 %
HCT: 35.1 % — ABNORMAL LOW (ref 36.0–46.0)
Hemoglobin: 12.1 g/dL (ref 12.0–15.0)
Immature Granulocytes: 0 %
Lymphocytes Relative: 23 %
Lymphs Abs: 2.1 10*3/uL (ref 0.7–4.0)
MCH: 39.5 pg — ABNORMAL HIGH (ref 26.0–34.0)
MCHC: 34.5 g/dL (ref 30.0–36.0)
MCV: 114.7 fL — ABNORMAL HIGH (ref 80.0–100.0)
Monocytes Absolute: 0.5 10*3/uL (ref 0.1–1.0)
Monocytes Relative: 5 %
Neutro Abs: 6.5 10*3/uL (ref 1.7–7.7)
Neutrophils Relative %: 71 %
Platelets: 264 10*3/uL (ref 150–400)
RBC: 3.06 MIL/uL — ABNORMAL LOW (ref 3.87–5.11)
RDW: 13 % (ref 11.5–15.5)
WBC: 9.2 10*3/uL (ref 4.0–10.5)
nRBC: 0 % (ref 0.0–0.2)

## 2019-01-30 LAB — HEMOGLOBIN A1C
Hgb A1c MFr Bld: 4.9 % (ref 4.8–5.6)
Mean Plasma Glucose: 93.93 mg/dL

## 2019-01-30 LAB — INTERLEUKIN-6, PLASMA: Interleukin-6, Plasma: 16.3 pg/mL — ABNORMAL HIGH (ref 0.0–12.2)

## 2019-01-30 LAB — COMPREHENSIVE METABOLIC PANEL
ALT: 20 U/L (ref 0–44)
AST: 19 U/L (ref 15–41)
Albumin: 3.4 g/dL — ABNORMAL LOW (ref 3.5–5.0)
Alkaline Phosphatase: 109 U/L (ref 38–126)
Anion gap: 12 (ref 5–15)
BUN: 9 mg/dL (ref 6–20)
CO2: 25 mmol/L (ref 22–32)
Calcium: 8.6 mg/dL — ABNORMAL LOW (ref 8.9–10.3)
Chloride: 99 mmol/L (ref 98–111)
Creatinine, Ser: 0.8 mg/dL (ref 0.44–1.00)
GFR calc Af Amer: >60 mL/min (ref >60)
GFR calc non Af Amer: >60 mL/min (ref >60)
Glucose, Bld: 98 mg/dL (ref 70–99)
Potassium: 3.4 mmol/L — ABNORMAL LOW (ref 3.5–5.1)
Sodium: 136 mmol/L (ref 135–145)
Total Bilirubin: 0.6 mg/dL (ref 0.3–1.2)
Total Protein: 6.4 g/dL — ABNORMAL LOW (ref 6.5–8.1)

## 2019-01-30 LAB — ECHOCARDIOGRAM LIMITED
Height: 63 in
Weight: 2021.18 oz

## 2019-01-30 LAB — MAGNESIUM: Magnesium: 1.4 mg/dL — ABNORMAL LOW (ref 1.7–2.4)

## 2019-01-30 MED ORDER — ATORVASTATIN CALCIUM 80 MG PO TABS: 80.0000 mg | ORAL_TABLET | Freq: Every day | ORAL | 0 refills | Status: DC

## 2019-01-30 MED ORDER — CLOPIDOGREL BISULFATE 75 MG PO TABS: 75.0000 mg | ORAL_TABLET | Freq: Every day | ORAL | 0 refills | Status: DC

## 2019-01-30 MED ORDER — ATORVASTATIN CALCIUM 80 MG PO TABS: 80.0000 mg | ORAL_TABLET | Freq: Every day | ORAL | Status: DC

## 2019-01-30 MED ORDER — MAGNESIUM SULFATE 2 GM/50ML IV SOLN
2.0000 g | Freq: Once | INTRAVENOUS | Status: AC
  Administered 2019-01-30: 2 g via INTRAVENOUS
  Filled 2019-01-30: qty 50

## 2019-01-30 MED ORDER — MAGNESIUM OXIDE 400 (241.3 MG) MG PO TABS: 400.0000 mg | ORAL_TABLET | Freq: Every day | ORAL | 0 refills | Status: DC

## 2019-01-30 MED ORDER — ATORVASTATIN CALCIUM 40 MG PO TABS: 40.0000 mg | ORAL_TABLET | Freq: Every day | ORAL | Status: DC

## 2019-01-30 MED ORDER — HEPARIN SOD (PORK) LOCK FLUSH 100 UNIT/ML IV SOLN
500.0000 [IU] | INTRAVENOUS | Status: AC | PRN
  Administered 2019-01-30: 500 [IU]

## 2019-01-30 MED ORDER — ASPIRIN EC 81 MG PO TBEC: 81.0000 mg | DELAYED_RELEASE_TABLET | Freq: Every day | ORAL | 2 refills | Status: AC

## 2019-01-30 NOTE — Progress Notes (Signed)
VASCULAR LAB PRELIMINARY  PRELIMINARY  PRELIMINARY  PRELIMINARY  Bilateral lower extremity venous duplex completed.    Preliminary report:  See CV proc for preliminary results.   Cameron Katayama, RVT 01/30/2019, 11:11 AM

## 2019-01-30 NOTE — Progress Notes (Signed)
Person Under Monitoring Name: Debra Kidd  Location: [address] [place] [address]   Infection Prevention Recommendations for Individuals Confirmed to have, or Being Evaluated for, 2019 Novel Coronavirus (COVID-19) Infection Who Receive Care at Home  Individuals who are confirmed to have, or are being evaluated for, COVID-19 should follow the prevention steps below until a healthcare provider or local or state health department says they can return to normal activities.  Stay home except to get medical care You should restrict activities outside your home, except for getting medical care. Do not go to work, school, or public areas, and do not use public transportation or taxis.  Call ahead before visiting your doctor Before your medical appointment, call the healthcare provider and tell them that you have, or are being evaluated for, COVID-19 infection. This will help the healthcare provider's office take steps to keep other people from getting infected. Ask your healthcare provider to call the local or state health department.  Monitor your symptoms Seek prompt medical attention if your illness is worsening (e.g., difficulty breathing). Before going to your medical appointment, call the healthcare provider and tell them that you have, or are being evaluated for, COVID-19 infection. Ask your healthcare provider to call the local or state health department.  Wear a facemask You should wear a facemask that covers your nose and mouth when you are in the same room with other people and when you visit a healthcare provider. People who live with or visit you should also wear a facemask while they are in the same room with you.  Separate yourself from other people in your home As much as possible, you should stay in a different room from other people in your home. Also, you should use a separate bathroom, if available.  Avoid sharing household items You should not  share dishes, drinking glasses, cups, eating utensils, towels, bedding, or other items with other people in your home. After using these items, you should wash them thoroughly with soap and water.  Cover your coughs and sneezes Cover your mouth and nose with a tissue when you cough or sneeze, or you can cough or sneeze into your sleeve. Throw used tissues in a lined trash can, and immediately wash your hands with soap and water for at least 20 seconds or use an alcohol-based hand rub.  Wash your [facility] your hands often and thoroughly with soap and water for at least 20 seconds. You can use an alcohol-based hand sanitizer if soap and water are not available and if your hands are not visibly dirty. Avoid touching your eyes, nose, and mouth with unwashed hands.   Prevention Steps for Caregivers and Household Members of Individuals Confirmed to have, or Being Evaluated for, COVID-19 Infection Being Cared for in the Home  If you live with, or provide care at home for, a person confirmed to have, or being evaluated for, COVID-19 infection please follow these guidelines to prevent infection:  Follow healthcare provider's instructions Make sure that you understand and can help the patient follow any healthcare provider instructions for all care.  Provide for the patient's basic needs You should help the patient with basic needs in the home and provide support for getting groceries, prescriptions, and other personal needs.  Monitor the patient's symptoms If they are getting sicker, call his or her medical provider and tell them that the patient has, or is being evaluated for, COVID-19 infection. This will help the healthcare provider's  office take steps to keep other people from getting infected. Ask the healthcare provider to call the local or state health department.  Limit the number of people who have contact with the patient  If possible, have only one caregiver for the patient.   Other household members should stay in another home or place of residence. If this is not possible, they should stay  in another room, or be separated from the patient as much as possible. Use a separate bathroom, if available.  Restrict visitors who do not have an essential need to be in the home.  Keep older adults, very young children, and other sick people away from the patient Keep older adults, very young children, and those who have compromised immune systems or chronic health conditions away from the patient. This includes people with chronic heart, lung, or kidney conditions, diabetes, and cancer.  Ensure good ventilation Make sure that shared spaces in the home have good air flow, such as from an air conditioner or an opened window, weather permitting.  Wash your hands often  Wash your hands often and thoroughly with soap and water for at least 20 seconds. You can use an alcohol based hand sanitizer if soap and water are not available and if your hands are not visibly dirty.  Avoid touching your eyes, nose, and mouth with unwashed hands.  Use disposable paper towels to dry your hands. If not available, use dedicated cloth towels and replace them when they become wet.  Wear a facemask and gloves  Wear a disposable facemask at all times in the room and gloves when you touch or have contact with the patient's blood, body fluids, and/or secretions or excretions, such as sweat, saliva, sputum, nasal mucus, vomit, urine, or feces.  Ensure the mask fits over your nose and mouth tightly, and do not touch it during use.  Throw out disposable facemasks and gloves after using them. Do not reuse.  Wash your hands immediately after removing your facemask and gloves.  If your personal clothing becomes contaminated, carefully remove clothing and launder. Wash your hands after handling contaminated clothing.  Place all used disposable facemasks, gloves, and other waste in a lined container  before disposing them with other household waste.  Remove gloves and wash your hands immediately after handling these items.  Do not share dishes, glasses, or other household items with the patient  Avoid sharing household items. You should not share dishes, drinking glasses, cups, eating utensils, towels, bedding, or other items with a patient who is confirmed to have, or being evaluated for, COVID-19 infection.  After the person uses these items, you should wash them thoroughly with soap and water.  Wash laundry thoroughly  Immediately remove and wash clothes or bedding that have blood, body fluids, and/or secretions or excretions, such as sweat, saliva, sputum, nasal mucus, vomit, urine, or feces, on them.  Wear gloves when handling laundry from the patient.  Read and follow directions on labels of laundry or clothing items and detergent. In general, wash and dry with the warmest temperatures recommended on the label.  Clean all areas the individual has used often  Clean all touchable surfaces, such as counters, tabletops, doorknobs, bathroom fixtures, toilets, phones, keyboards, tablets, and bedside tables, every day. Also, clean any surfaces that may have blood, body fluids, and/or secretions or excretions on them.  Wear gloves when cleaning surfaces the patient has come in contact with.  Use a diluted bleach solution (e.g., dilute bleach with 1  part bleach and 10 parts water) or a household disinfectant with a label that says EPA-registered for coronaviruses. To make a bleach solution at home, add 1 tablespoon of bleach to 1 quart (4 cups) of water. For a larger supply, add  cup of bleach to 1 gallon (16 cups) of water.  Read labels of cleaning products and follow recommendations provided on product labels. Labels contain instructions for safe and effective use of the cleaning product including precautions you should take when applying the product, such as wearing gloves or eye  protection and making sure you have good ventilation during use of the product.  Remove gloves and wash hands immediately after cleaning.  Monitor yourself for signs and symptoms of illness Caregivers and household members are considered close contacts, should monitor their health, and will be asked to limit movement outside of the home to the extent possible. Follow the monitoring steps for close contacts listed on the symptom monitoring form.   ? If you have additional questions, contact your local health department or call the epidemiologist on call at [phone] (available 24/7). ? This guidance is subject to change. For the most up-to-date guidance from [facility], please refer to their website: YouBlogs.pl

## 2019-01-30 NOTE — Evaluation (Signed)
SLP Cancellation Note  Patient Details Name: Debra Kidd MRN: [id number] DOB: 03-14-1961   Cancelled treatment:       Reason Eval/Treat Not Completed: Other (comment)(pt discharging, if concerns present for cognitive linguistic abilities, op slp referral/eval can be completed, thanks)   Macario Golds 01/30/2019, 2:29 PM

## 2019-01-30 NOTE — Discharge Summary (Signed)
Discharge Summary  Debra Kidd UKG:[id number] DOB: [birth year]  PCP: [name], MD  Admit date: 01/28/2019 Discharge date: 01/30/2019  Time spent: 33mns, more than 50% time spent on coordination of care.  Recommendations for Outpatient Follow-up:  1. F/u with PCP within a week  for hospital discharge follow up, repeat cbc/bmp at follow up. pcp to follow up on final IL6 test result 2. F/u with neurology for stroke follow up 3. F/u with cardiology for outpatient event monitor for stroke work up 4. F/u with oncology  5. COVID 19 Positive status precaution    Discharge Diagnoses:  Active Hospital Problems   Diagnosis Date Noted   Left-sided weakness 01/29/2019   Acute ischemic stroke ([facility] 01/29/2019   COVID-19 virus infection 01/29/2019   Hypokalemia due to persistent hypomagnesemia 08/25/2018   Hypertension 08/07/2016   Small cell carcinoma of right lung ([facility] 05/30/2016    Resolved Hospital Problems  No resolved problems to display.    Discharge Condition: stable  Diet recommendation: heart healthy  Filed Weights   01/29/19 0559  Weight: 57.3 kg    History of present illness: (per admitting MD Dr KMaudie Mercury JTerrilee Croak is a 58y.o. female,  w h/o metastatic small cell lung cancer dx 2017 , hypertension, hyperlipidemia, apparently c/o left sided weakness of the arm and left since Monday.  Pt also noted slightly slurred speech and ? Left facial droop.  Pt came primarily due to the weakness.  Speech is fluent today.   In ED,  T 97.9 P 81 Bp 140/92  Pox 99% on RA  Wbc 9.5, Hgb 12.4, Plt 243 Bun 8, Creatinine 0.75   Na 138, K 2.9   Glucose 111  SARS + CXR pending  Urinalysis negative  Kcl 40 meq po x1 in ED  Spoke with Dr. BSedonia Smalland requested that he consult neurology  Pt will be admitted for left sided weakness r/o CVA, vs metastatic disease, hypokalemia, and COVID +.      Hospital Course:  Principal Problem:   Left-sided weakness Active  Problems:   Small cell carcinoma of right lung (HCC)   Hypertension   Hypokalemia due to persistent hypomagnesemia   Acute ischemic stroke ([facility]   COVID-19 virus infection   Acute CVA with left side weakness MRI "1. Multifocal subacute ischemia within the right PCA territory, including the right occipital lobe, medial right temporal lobe and right cerebral peduncle. 2. 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 3. Severely diminished flow within the right vertebral artery, which is likely severely stenotic and/or intermittently occluded. 4. Severe stenosis of the proximal left common carotid artery."   Venous doppler no DVT  Neurology consulted,input appreciated Patient declined CIR/SNF or homehealth, she is discharged home on DAPT with asa/plavix for total of three months then asa alone She is started on lipitor Advised to quit smoking  She is to follow up with neurology, she is to have outpatient event monitor. Cardiology office notified through epic.   SARS Cov2 positive, she does not appear to have any covid symptom Droplet and contact precautions Mildly elevated esr at 68, d dimer at 0.6, fibirnogen at 485, ferrintin 712,  Crp/ldh  Unremarkable, troponin negative IL-6 in process CXR "Bibasilar atelectasis. Chronic changes in the right apex."  Hypokalemia/hypomagnesemia  replaced , she is discharged on k and mag oral supplement, pcp to repeat labs at hospital follow up.   metastatic small cell lung cancer dx 2017  Reports currently getting immunotherapy Followed by  Dr Julien Nordmann  Dr Julien Nordmann alerted regarding the new cva and Positive covid19  result through epic.  Chronic pain Percocet=>Morphine 0.30m iv q6h prn  Zofran 4735miv q6h prn n/v  Anxiety Valium=> ativan 35m52mv q6h prn  Code Status: full  Family Communication: patient   Disposition Plan: home with neurology clearance She is not able to get cir placement  due to + covid,  Patient declined snf or  home health, she states she has everything she need at home, her two sons and her husband can help her.   Consultants:  neurology  Procedures:  none  Antibiotics:  none   Discharge Exam: BP 122/81 (BP Location: Left Arm)    Pulse 86    Temp 98.5 F (36.9 C) (Oral)    Resp 18    Ht _0  (1.6 m)    Wt 57.3 kg    SpO2 93%    BMI 22.38 kg/m   General: NAD Cardiovascular: RRR Respiratory: CTABL  Discharge Instructions You were cared for by a hospitalist during your hospital stay. If you have any questions about your discharge medications or the care you received while you were in the hospital after you are discharged, you can call the unit and asked to speak with the hospitalist on call if the hospitalist that took care of you is not available. Once you are discharged, your primary care physician will handle any further medical issues. Please note that NO REFILLS for any discharge medications will be authorized once you are discharged, as it is imperative that you return to your primary care physician (or establish a relationship with a primary care physician if you do not have one) for your aftercare needs so that they can reassess your need for medications and monitor your lab values.  Discharge Instructions    Ambulatory referral to Neurology   Complete by:  As directed    An appointment is requested in approximately: 4 weeks For stroke follow up.   Diet - low sodium heart healthy   Complete by:  As directed    Discharge instructions   Complete by:  As directed    Please consider stop smoking cigarettes   Increase activity slowly   Complete by:  As directed    MyChart COVID-19 home monitoring program   Complete by:  Jan 30, 2019    Is the patient willing to use the [facility] home monitoring?:  No     Allergies as of 01/30/2019      Reactions   Codeine Nausea And Vomiting   Motrin [ibuprofen]    Elevation of BP   [name]    intolerant    Vicodin [hydrocodone-acetaminophen] Nausea And Vomiting      Medication List    STOP taking these medications   methocarbamol 500 MG tablet Commonly known as:  ROBAXIN     TAKE these medications   aspirin EC 81 MG tablet Take 1 tablet (81 mg total) by mouth daily.   atorvastatin 80 MG tablet Commonly known as:  LIPITOR Take 1 tablet (80 mg total) by mouth daily at 6 PM.   celecoxib 200 MG capsule Commonly known as:  CELEBREX Take 200 mg by mouth daily as needed for mild pain.   clopidogrel 75 MG tablet Commonly known as:  PLAVIX Take 1 tablet (75 mg total) by mouth daily. Start taking on:  Jan 31, 2019   diazepam 5 MG tablet Commonly known as:  VALIUM Take 5  mg by mouth 3 (three) times daily as needed for anxiety.   diphenhydrAMINE 25 MG tablet Commonly known as:  BENADRYL Take 25 mg by mouth every 6 (six) hours as needed for itching or allergies.   lidocaine-prilocaine cream Commonly known as:  EMLA Apply 1 application topically as needed. What changed:  reasons to take this   loperamide 2 MG tablet Commonly known as:  IMODIUM A-D Take 2 mg by mouth 4 (four) times daily as needed for diarrhea or loose stools.   magnesium oxide 400 (241.3 Mg) MG tablet Commonly known as:  MAG-OX Take 1 tablet (400 mg total) by mouth daily.   mirtazapine 15 MG tablet Commonly known as:  REMERON Take 1 tablet (15 mg total) by mouth at bedtime.   oxyCODONE-acetaminophen 5-325 MG tablet Commonly known as:  Percocet Take 1 tablet by mouth every 6 (six) hours as needed for severe pain.   polyethylene glycol 17 g packet Commonly known as:  MIRALAX / GLYCOLAX Take 17 g by mouth daily.   potassium chloride 20 MEQ/15ML (10%) Soln Take 30 mLs (40 mEq total) by mouth daily.   prochlorperazine 10 MG tablet Commonly known as:  COMPAZINE Take 1 tablet (10 mg total) by mouth every 6 (six) hours as needed for nausea or vomiting.   senna-docusate 8.6-50 MG tablet Commonly known  as:  Senokot-S Take 2 tablets by mouth at bedtime.   traMADol 50 MG tablet Commonly known as:  ULTRAM Take 50 mg by mouth every 6 (six) hours as needed for moderate pain.   Vitamin D (Ergocalciferol) 1.25 MG (50000 UT) Caps capsule Commonly known as:  DRISDOL Take 1 capsule (50,000 Units total) by mouth every 7 (seven) days.      Allergies  Allergen Reactions   Codeine Nausea And Vomiting   Motrin [Ibuprofen]     Elevation of BP   [name]     intolerant   Vicodin [Hydrocodone-Acetaminophen] Nausea And Vomiting   Follow-up Information    Little, James, MD Follow up in 1 week(s).   Specialty:  Family Medicine Why:  hospital discharge follow up please follow up with pcp or neurology for plavix refill Contact information: [place] [address] [phone]        [facility] [address] [facility] Follow up.   Specialty:  Cardiology Why:  please give cardiology office a call if you donot hear from then in three business days for outpatient cardiac monitoring Contact information: [address], [place] Follow up in 1 month(s).   Why:  for stroke follow up. Contact information: [address]     [place] [id number] [phone]           The results of significant diagnostics from this hospitalization (including imaging, microbiology, ancillary and laboratory) are listed below for reference.    Significant Diagnostic Studies: Ct Head Wo Contrast  Result Date: 01/29/2019 CLINICAL DATA:  Fall several days ago with left-sided numbness and weakness, initial encounter EXAM: CT HEAD WITHOUT CONTRAST TECHNIQUE: Contiguous axial images were obtained from the base of the skull through the vertex without intravenous contrast. COMPARISON:  11/07/2016 FINDINGS: Brain: Mild atrophic and chronic white matter ischemic changes  are noted. No findings to suggest acute hemorrhage, acute infarction or space-occupying mass lesion are noted. Vascular: No hyperdense vessel or unexpected calcification. Skull: Normal. Negative for fracture or focal  lesion. Sinuses/Orbits: No acute finding. Other: None. IMPRESSION: Mild atrophic and chronic white matter ischemic changes. No acute abnormality noted. Electronically Signed   By: Inez Catalina M.D.   On: 01/29/2019 00:08   Mr Jodene Nam Neck W Wo Contrast  Result Date: 01/29/2019 CLINICAL DATA:  History of lung cancer. Left-sided weakness that began 4 days ago. EXAM: MRI HEAD WITHOUT AND WITH CONTRAST MRA HEAD WITHOUT CONTRAST MRA NECK WITHOUT AND WITH CONTRAST TECHNIQUE: Multiplanar, multiecho pulse sequences of the brain and surrounding structures were obtained without and with intravenous contrast. Angiographic images of the Circle of Willis were obtained using MRA technique without intravenous contrast. Angiographic images of the neck were obtained using MRA technique without and with intravenous contrast. Carotid stenosis measurements (when applicable) are obtained utilizing NASCET criteria, using the distal internal carotid diameter as the denominator. CONTRAST:  5 mL Gadavist COMPARISON:  Head CT 01/28/2019 FINDINGS: MRI HEAD FINDINGS BRAIN: The midline structures are normal. There is multifocal acute ischemia within the right occipital lobe, medial right temporal lobe and at the right cerebral peduncle. Diffuse confluent hyperintense T2-weighted signal within the periventricular, deep and juxtacortical white matter, most commonly due to chronic ischemic microangiopathy. Generalized atrophy without lobar predilection. Blood-sensitive sequences show no chronic microhemorrhage or superficial siderosis. There is mild leptomeningeal contrast enhancement at site of right occipital ischemia. No mass lesion. SKULL AND UPPER CERVICAL SPINE: The visualized skull base, calvarium, upper cervical spine and  extracranial soft tissues are normal. SINUSES/ORBITS: No fluid levels or advanced mucosal thickening. No mastoid or middle ear effusion. The orbits are normal. MRA HEAD FINDINGS POSTERIOR CIRCULATION: --Vertebral arteries: Limited flow-related enhancement of the right V4 segment. Normal left V4 segment. --Posterior inferior cerebellar arteries (PICA): Patent origins from the vertebral arteries. --Anterior inferior cerebellar arteries (AICA): Patent origins from the basilar artery. --Basilar artery: Normal. --Superior cerebellar arteries: Normal. --Posterior cerebral arteries (PCA): The right posterior cerebral artery is occluded near its origin. The left PCA is normal. ANTERIOR CIRCULATION: --Intracranial internal carotid arteries: Normal. --Anterior cerebral arteries (ACA): Normal. Both A1 segments are present. Patent anterior communicating artery (a-comm). --Middle cerebral arteries (MCA): Normal. MRA NECK FINDINGS Aortic arch: Normal 3 vessel aortic branching pattern. The visualized subclavian arteries are normal. Right carotid system: Normal course and caliber without stenosis or evidence of dissection. Left carotid system: There is severe stenosis of the proximal left common carotid artery. Left ICA is normal. Vertebral arteries: Normal left vertebral artery. Poor contrast enhancement of the right vertebral artery. IMPRESSION: 1. Multifocal subacute ischemia within the right PCA territory, including the right occipital lobe, medial right temporal lobe and right cerebral peduncle. 2. 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 3. Severely diminished flow within the right vertebral artery, which is likely severely stenotic and/or intermittently occluded. 4. Severe stenosis of the proximal left common carotid artery. Electronically Signed   By: Ulyses Jarred M.D.   On: 01/29/2019 19:28   Mr Jeri Cos RX Contrast  Result Date: 01/29/2019 CLINICAL DATA:  History of lung cancer. Left-sided weakness that began 4  days ago. EXAM: MRI HEAD WITHOUT AND WITH CONTRAST MRA HEAD WITHOUT CONTRAST MRA NECK WITHOUT AND WITH CONTRAST TECHNIQUE: Multiplanar, multiecho pulse sequences of the brain and surrounding structures were obtained without and with intravenous contrast. Angiographic images of the Circle of Willis were obtained using MRA technique without intravenous contrast. Angiographic images of the neck were obtained using MRA technique without and with intravenous contrast. Carotid stenosis measurements (when applicable) are obtained utilizing NASCET  criteria, using the distal internal carotid diameter as the denominator. CONTRAST:  5 mL Gadavist COMPARISON:  Head CT 01/28/2019 FINDINGS: MRI HEAD FINDINGS BRAIN: The midline structures are normal. There is multifocal acute ischemia within the right occipital lobe, medial right temporal lobe and at the right cerebral peduncle. Diffuse confluent hyperintense T2-weighted signal within the periventricular, deep and juxtacortical white matter, most commonly due to chronic ischemic microangiopathy. Generalized atrophy without lobar predilection. Blood-sensitive sequences show no chronic microhemorrhage or superficial siderosis. There is mild leptomeningeal contrast enhancement at site of right occipital ischemia. No mass lesion. SKULL AND UPPER CERVICAL SPINE: The visualized skull base, calvarium, upper cervical spine and extracranial soft tissues are normal. SINUSES/ORBITS: No fluid levels or advanced mucosal thickening. No mastoid or middle ear effusion. The orbits are normal. MRA HEAD FINDINGS POSTERIOR CIRCULATION: --Vertebral arteries: Limited flow-related enhancement of the right V4 segment. Normal left V4 segment. --Posterior inferior cerebellar arteries (PICA): Patent origins from the vertebral arteries. --Anterior inferior cerebellar arteries (AICA): Patent origins from the basilar artery. --Basilar artery: Normal. --Superior cerebellar arteries: Normal. --Posterior cerebral  arteries (PCA): The right posterior cerebral artery is occluded near its origin. The left PCA is normal. ANTERIOR CIRCULATION: --Intracranial internal carotid arteries: Normal. --Anterior cerebral arteries (ACA): Normal. Both A1 segments are present. Patent anterior communicating artery (a-comm). --Middle cerebral arteries (MCA): Normal. MRA NECK FINDINGS Aortic arch: Normal 3 vessel aortic branching pattern. The visualized subclavian arteries are normal. Right carotid system: Normal course and caliber without stenosis or evidence of dissection. Left carotid system: There is severe stenosis of the proximal left common carotid artery. Left ICA is normal. Vertebral arteries: Normal left vertebral artery. Poor contrast enhancement of the right vertebral artery. IMPRESSION: 1. Multifocal subacute ischemia within the right PCA territory, including the right occipital lobe, medial right temporal lobe and right cerebral peduncle. 2. 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 3. Severely diminished flow within the right vertebral artery, which is likely severely stenotic and/or intermittently occluded. 4. Severe stenosis of the proximal left common carotid artery. Electronically Signed   By: Ulyses Jarred M.D.   On: 01/29/2019 19:28   Mr Jodene Nam Head Wo Contrast  Result Date: 01/29/2019 CLINICAL DATA:  History of lung cancer. Left-sided weakness that began 4 days ago. EXAM: MRI HEAD WITHOUT AND WITH CONTRAST MRA HEAD WITHOUT CONTRAST MRA NECK WITHOUT AND WITH CONTRAST TECHNIQUE: Multiplanar, multiecho pulse sequences of the brain and surrounding structures were obtained without and with intravenous contrast. Angiographic images of the Circle of Willis were obtained using MRA technique without intravenous contrast. Angiographic images of the neck were obtained using MRA technique without and with intravenous contrast. Carotid stenosis measurements (when applicable) are obtained utilizing NASCET criteria, using the distal  internal carotid diameter as the denominator. CONTRAST:  5 mL Gadavist COMPARISON:  Head CT 01/28/2019 FINDINGS: MRI HEAD FINDINGS BRAIN: The midline structures are normal. There is multifocal acute ischemia within the right occipital lobe, medial right temporal lobe and at the right cerebral peduncle. Diffuse confluent hyperintense T2-weighted signal within the periventricular, deep and juxtacortical white matter, most commonly due to chronic ischemic microangiopathy. Generalized atrophy without lobar predilection. Blood-sensitive sequences show no chronic microhemorrhage or superficial siderosis. There is mild leptomeningeal contrast enhancement at site of right occipital ischemia. No mass lesion. SKULL AND UPPER CERVICAL SPINE: The visualized skull base, calvarium, upper cervical spine and extracranial soft tissues are normal. SINUSES/ORBITS: No fluid levels or advanced mucosal thickening. No mastoid or middle ear effusion. The orbits  are normal. MRA HEAD FINDINGS POSTERIOR CIRCULATION: --Vertebral arteries: Limited flow-related enhancement of the right V4 segment. Normal left V4 segment. --Posterior inferior cerebellar arteries (PICA): Patent origins from the vertebral arteries. --Anterior inferior cerebellar arteries (AICA): Patent origins from the basilar artery. --Basilar artery: Normal. --Superior cerebellar arteries: Normal. --Posterior cerebral arteries (PCA): The right posterior cerebral artery is occluded near its origin. The left PCA is normal. ANTERIOR CIRCULATION: --Intracranial internal carotid arteries: Normal. --Anterior cerebral arteries (ACA): Normal. Both A1 segments are present. Patent anterior communicating artery (a-comm). --Middle cerebral arteries (MCA): Normal. MRA NECK FINDINGS Aortic arch: Normal 3 vessel aortic branching pattern. The visualized subclavian arteries are normal. Right carotid system: Normal course and caliber without stenosis or evidence of dissection. Left carotid system:  There is severe stenosis of the proximal left common carotid artery. Left ICA is normal. Vertebral arteries: Normal left vertebral artery. Poor contrast enhancement of the right vertebral artery. IMPRESSION: 1. Multifocal subacute ischemia within the right PCA territory, including the right occipital lobe, medial right temporal lobe and right cerebral peduncle. 2. 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 3. Severely diminished flow within the right vertebral artery, which is likely severely stenotic and/or intermittently occluded. 4. Severe stenosis of the proximal left common carotid artery. Electronically Signed   By: Ulyses Jarred M.D.   On: 01/29/2019 19:28   Vas [place] Lower Extremity Venous (dvt)  Result Date: 01/30/2019  Lower Venous Study Indications: Stroke, and Covid-19 positive.  Limitations: Body habitus. Comparison Study: No prior study on file for comparison. Performing Technologist: Sharion Dove RVS  Examination Guidelines: A complete evaluation includes B-mode imaging, spectral Doppler, color Doppler, and power Doppler as needed of all accessible portions of each vessel. Bilateral testing is considered an integral part of a complete examination. Limited examinations for reoccurring indications may be performed as noted.  +---------+---------------+---------+-----------+----------+-------+  RIGHT     Compressibility Phasicity Spontaneity Properties Summary  +---------+---------------+---------+-----------+----------+-------+  CFV       Full            Yes       Yes                             +---------+---------------+---------+-----------+----------+-------+  SFJ       Full                                                      +---------+---------------+---------+-----------+----------+-------+  FV Prox   Full                                                      +---------+---------------+---------+-----------+----------+-------+  FV Mid    Full                                                       +---------+---------------+---------+-----------+----------+-------+  FV Distal Full                                                      +---------+---------------+---------+-----------+----------+-------+  PFV       Full                                                      +---------+---------------+---------+-----------+----------+-------+  POP       Full            Yes       Yes                             +---------+---------------+---------+-----------+----------+-------+  PTV       Full                                                      +---------+---------------+---------+-----------+----------+-------+  PERO      Full                                                      +---------+---------------+---------+-----------+----------+-------+   +---------+---------------+---------+-----------+----------+-------------------+  LEFT      Compressibility Phasicity Spontaneity Properties Summary              +---------+---------------+---------+-----------+----------+-------------------+  CFV       Full            Yes       Yes                                         +---------+---------------+---------+-----------+----------+-------------------+  SFJ       Full                                                                  +---------+---------------+---------+-----------+----------+-------------------+  FV Prox   Full                                                                  +---------+---------------+---------+-----------+----------+-------------------+  FV Mid    Full                                                                  +---------+---------------+---------+-----------+----------+-------------------+  FV Distal Full                                                                  +---------+---------------+---------+-----------+----------+-------------------+  PFV       Full                                                                   +---------+---------------+---------+-----------+----------+-------------------+  POP       Full            Yes       Yes                                         +---------+---------------+---------+-----------+----------+-------------------+  PTV       Full                                                                  +---------+---------------+---------+-----------+----------+-------------------+  PERO      Full                                             all segments not                                                                 visualized           +---------+---------------+---------+-----------+----------+-------------------+     Summary: Right: There is no evidence of deep vein thrombosis in the lower extremity. Left: There is no evidence of deep vein thrombosis in the lower extremity. However, portions of this examination were limited- see technologist comments above.  *See table(s) above for measurements and observations.    Preliminary     Microbiology: Recent Results (from the past 240 hour(s))  SARS Coronavirus 2 (CEPHEID - Performed in [place] hospital lab), Hosp Order     Status: Abnormal   Collection Time: 01/28/19 10:09 PM  Result Value Ref Range Status   SARS Coronavirus 2 POSITIVE (A) NEGATIVE Final    Comment: RESULT CALLED TO, READ BACK BY AND VERIFIED WITH:  Amalia Greenhouse RN [id number] 01/29/19 A NAVARRO (NOTE) If result is NEGATIVE SARS-CoV-2 target nucleic acids are NOT DETECTED. The SARS-CoV-2 RNA is generally detectable in upper and lower  respiratory specimens during the acute phase of  infection. The lowest  concentration of SARS-CoV-2 viral copies this assay can detect is 250  copies / mL. A negative result does not preclude SARS-CoV-2 infection  and should not be used as the sole basis for treatment or other  patient management decisions.  A negative result may occur with  improper specimen collection / handling, submission of specimen other  than nasopharyngeal swab, presence of viral mutation(s) within the  areas targeted by this assay, and inadequate number of viral copies  (<250 copies / mL). A negative result must be combined with clinical  observations, patient history, and  epidemiological information. If result is POSITIVE SARS-CoV-2 target nucleic acids are DETECTED. The SARS-CoV-2 RNA is generally detectable in upper and lower  respiratory specimens during the acute phase of infection.  Positive  results are indicative of active infection with SARS-CoV-2.  Clinical  correlation with patient history and other diagnostic information is  nece ssary to determine patient infection status.  Positive results do  not rule out bacterial infection or co-infection with other viruses. If result is PRESUMPTIVE POSTIVE SARS-CoV-2 nucleic acids MAY BE PRESENT.   A presumptive positive result was obtained on the submitted specimen  and confirmed on repeat testing.  While 2019 novel coronavirus  (SARS-CoV-2) nucleic acids may be present in the submitted sample  additional confirmatory testing may be necessary for epidemiological  and / or clinical management purposes  to differentiate between  SARS-CoV-2 and other Sarbecovirus currently known to infect humans.  If clinically indicated additional testing with an alternate test  methodology [phone]) is advised. The SARS-CoV-2 RNA is generally  detectable in upper and lower respiratory specimens during the acute  phase of infection. The expected result is Negative. Fact Sheet for Patients:  StrictlyIdeas.no Fact Sheet for Healthcare Providers: http InApartments.fi This test is not yet approved or cleared by the [place] FDA and has been authorized for detection and/or diagnosis of SARS-CoV-2 by FDA under an Emergency Use Authorization (EUA).  This EUA will remain in effect (meaning this test can be used) for the duration of the COVID-19 declaration under Section 564(b)(1) of the Act, 21 U.S.C. section 360bbb-3(b)(1), unless the authorization is terminated or revoked sooner. Performed at [facility], [place] [address] [place], [place] [address]       Labs: Basic Metabolic Panel: Recent Labs  Lab 01/28/19 2209 01/30/19 0608  NA 138 136  K 2.9* 3.4*  CL 100 99  CO2 29 25  GLUCOSE 111* 98  BUN 8 9  CREATININE 0.75 0.80  CALCIUM 9.1 8.6*  MG  --  1.4*   Liver Function Tests: Recent Labs  Lab 01/30/19 0608  AST 19  ALT 20  ALKPHOS 109  BILITOT 0.6  PROT 6.4*  ALBUMIN 3.4*   No results for input(s): LIPASE, AMYLASE in the last 168 hours. No results for input(s): AMMONIA in the last 168 hours. CBC: Recent Labs  Lab 01/28/19 2209 01/30/19 0608  WBC 9.5 9.2  NEUTROABS  --  6.5  HGB 12.4 12.1  HCT 34.8* 35.1*  MCV 113.7* 114.7*  PLT 243 264   Cardiac Enzymes: Recent Labs  Lab 01/29/19 0233  TROPONINI <0.03   BNP: BNP (last 3 results) Recent Labs    01/29/19 0233  BNP  42.1    ProBNP (last 3 results) No results for input(s): PROBNP in the last 8760 hours.  CBG: No results for input(s): GLUCAP in the last 168 hours.     Signed:  Florencia Reasons MD, PhD  [facility] 01/30/2019, 2:47 PM

## 2019-01-30 NOTE — Progress Notes (Addendum)
STROKE TEAM PROGRESS NOTE   SUBJECTIVE (INTERVAL HISTORY) No family is at the bedside.  Patient is sitting in bed, eager to go home.  DVT negative.  Recommend 30-day CardioNet monitoring as outpatient.  OBJECTIVE Vitals:   01/29/19 0559 01/29/19 0857 01/29/19 1654 01/29/19 2300  BP: 131/81 140/90 (!) 146/92 122/81  Pulse: 85 86 83 86  Resp: 20 16 16 18   Temp: 98.4 F (36.9 C) 98.1 F (36.7 C) 98.3 F (36.8 C) 98.5 F (36.9 C)  TempSrc: Oral Oral Oral Oral  SpO2: 97% 94% 96% 93%  Weight: 57.3 kg     Height: 5\' 3"  (1.6 m)       CBC:  Recent Labs  Lab 01/28/19 2209 01/30/19 0608  WBC 9.5 9.2  NEUTROABS  --  6.5  HGB 12.4 12.1  HCT 34.8* 35.1*  MCV 113.7* 114.7*  PLT 243 081    Basic Metabolic Panel:  Recent Labs  Lab 01/28/19 2209 01/30/19 0608  NA 138 136  K 2.9* 3.4*  CL 100 99  CO2 29 25  GLUCOSE 111* 98  BUN 8 9  CREATININE 0.75 0.80  CALCIUM 9.1 8.6*  MG  --  1.4*    Lipid Panel:     Component Value Date/Time   CHOL 241 (H) 01/30/2019 0608   TRIG 187 (H) 01/30/2019 0608   HDL 32 (L) 01/30/2019 0608   CHOLHDL 7.5 01/30/2019 0608   VLDL 37 01/30/2019 0608   LDLCALC 172 (H) 01/30/2019 0608   HgbA1c:  Lab Results  Component Value Date   HGBA1C 4.9 01/30/2019   Urine Drug Screen: No results found for: LABOPIA, COCAINSCRNUR, LABBENZ, AMPHETMU, THCU, LABBARB  Alcohol Level No results found for: ETH  IMAGING  Ct Head Wo Contrast 01/29/2019 IMPRESSION:  Mild atrophic and chronic white matter ischemic changes. No acute abnormality noted.    Mr [facility] Contrast Mr Jodene Nam Neck W Wo Contrast 01/29/2019 IMPRESSION:  1. Multifocal subacute ischemia within the right PCA territory, including the right occipital lobe, medial right temporal lobe and right cerebral peduncle.  2. 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  3. Severely diminished flow within the right vertebral artery, which is likely severely stenotic and/or intermittently occluded.   4. Severe stenosis of the proximal left common carotid artery.    Vas [place] Lower Extremity Venous (dvt) 01/30/2019 Summary:  Right: There is no evidence of deep vein thrombosis in the lower extremity.  Left: There is no evidence of deep vein thrombosis in the lower extremity. However, portions of this examination were limited- see technologist comments above.    Transthoracic Echocardiogram  01/29/2019 IMPRESSIONS  1. The left ventricle has normal systolic function, with an ejection fraction of 55-60%. The cavity size was normal. There is mild concentric left ventricular hypertrophy. Left ventricular diastolic Doppler parameters are consistent with indeterminate diastolic dysfunction.  2. The mitral valve is grossly normal.  3. The aortic valve was not well visualized.  4. The aortic root is normal in size and structure.  5. The interatrial septum was not well visualized.   EKG - SR rate 87 BPM. (See cardiology reading for complete details)   PHYSICAL EXAM  Temp:  [98.3 F (36.8 C)-98.5 F (36.9 C)] 98.5 F (36.9 C) (05/29 2300) Pulse Rate:  [83-86] 86 (05/29 2300) Resp:  [16-18] 18 (05/29 2300) BP: (122-146)/(81-92) 122/81 (05/29 2300) SpO2:  [93 %-96 %] 93 % (05/29 2300)  General - Well nourished, well developed, in no apparent  distress.  Ophthalmologic - fundi not visualized due to noncooperation.  Cardiovascular - Regular rate and rhythm.  Mental Status -  Level of arousal and orientation to time, place, and person were intact. Language including expression, naming, repetition, comprehension was assessed and found intact.  Cranial Nerves II - XII - II - Visual field intact OU, no hemianopia. III, IV, VI - Extraocular movements intact. V - Facial sensation intact bilaterally. VII - mild left facial droop. VIII - Hearing & vestibular intact bilaterally. X - Palate elevates symmetrically. XI - Chin turning & shoulder shrug intact bilaterally. XII - Tongue protrusion  intact.  Motor Strength - The patient's strength was normal in RUE and RLE, however, left upper extremity proximal 4/5, distal 5/5, left lower extremity proximal and distal 5-/5.  Bulk was normal and fasciculations were absent.   Motor Tone - Muscle tone was assessed at the neck and appendages and was normal.  Reflexes - The patient's reflexes were symmetrical in all extremities and she had no pathological reflexes.  Sensory - Light touch, temperature/pinprick were assessed and were symmetrical.    Coordination - The patient had normal movements in the hands with no ataxia or dysmetria, however left finger-to-nose slow action.  Tremor was absent.  Gait and Station - deferred.     ASSESSMENT/PLAN [name] is a 58 y.o. female with history of lung cancer (s/p chemo and radiation), ongoing tobacco use, hyperlipidemia, hypertension, chronic pain, presenting with left sided weakness. She did not receive IV t-PA due to late presentation (>4.5 hours from time of onset)  Stroke: right PCA, mesial temporal lobe, midbrain, MCA/PCA as well as left punctate MCA/ACA infarcts - embolic - unclear etiology.  Resultant left-sided hemiparesis  CT head - No acute abnormality noted.   MRI head - Multifocal subacute ischemia within the right PCA territory, including the right occipital lobe, medial right temporal lobe and right cerebral peduncle.   MRA head - Occlusion of the right PCA near its origin. Severely diminished flow within the right vertebral artery, which is likely severely stenotic and/or intermittently occluded. Severe stenosis of the proximal left common carotid artery.  Bilateral Lower Extremity Venous Dopplers - negative for DVT  2D Echo  - EF 55 - 60%. No cardiac source of emboli identified.   Recommend 30-day CardioNet monitoring as outpatient to rule out A. fib  [employer] Virus 2 - positive (asymptomatic)  LDL - 172  HgbA1c - 4.9  UDS - pending  VTE prophylaxis -  SCDs  Diet  - Heart healthy with thin liquids.  No antithrombotic prior to admission, now on aspirin 325 mg daily and clopidogrel 75 mg daily.  Continue DAPT for 3 months and then aspirin alone given large vessel occlusion.  Patient counseled to be compliant with her antithrombotic medications  Ongoing aggressive stroke risk factor management  Therapy recommendations:  CIR recommended vs SNF, however patient eager to go home.  We will set up home health PT/OT  Disposition: Home today  Common carotid stenosis  MRA neck showed left CCA high-grade stenosis  Does not seem to relate to the stroke this time  Likely asymptomatic  Recommend outpatient vascular surgery follow-up  Positive COVID-19  Asymptomatic  COVID-19 test positive  Management as per primary team  Hypertension  Stable . Permissive hypertension (OK if < 220/120) but gradually normalize in 3-5 days . 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 After hours, contact General Neurology

## 2019-01-30 NOTE — Progress Notes (Signed)
Patient discharged home with instructions to follow-up with cardiology for outpatient continuous monitoring. She will return for follow-up with neuro in 1 month. Patient verbalized understanding of emergency response to worsening of COVID symptoms or extension of stroke. She was given information from [facility] on how to self-isolate at home. Perscriptions are awaiting her at her pharmacy of choice. No further concerns at this time.

## 2019-02-01 ENCOUNTER — Other Ambulatory Visit: Payer: Self-pay | Admitting: Student

## 2019-02-01 ENCOUNTER — Telehealth: Payer: Self-pay | Admitting: Medical Oncology

## 2019-02-01 DIAGNOSIS — I639 Cerebral infarction, unspecified: Secondary | ICD-10-CM

## 2019-02-01 NOTE — Telephone Encounter (Signed)
Had a stroke ,covid + .Left leg and arm weakness. June 9th appts cancelled. Schedule message sent for June 16th.

## 2019-02-01 NOTE — Progress Notes (Signed)
   Ordered outpatient event monitor to look for atrial fibrillation following acute stroke at the request of Neurology.  Darreld Mclean, PA-C 02/01/2019 4:22 PM

## 2019-02-02 ENCOUNTER — Other Ambulatory Visit: Payer: Self-pay

## 2019-02-02 ENCOUNTER — Telehealth (INDEPENDENT_AMBULATORY_CARE_PROVIDER_SITE_OTHER): Payer: Self-pay | Admitting: Family Medicine

## 2019-02-02 ENCOUNTER — Telehealth: Payer: Self-pay | Admitting: Internal Medicine

## 2019-02-02 NOTE — Patient Outreach (Signed)
[facility]) Care Management  02/02/2019  SAYSHA MENTA 09/28/1960 [id number]  EMMI: stroke red alert Referral date: 02/02/19 Referral reason: scheduled follow up appointment: no Insurance: Medicare Day # 1  Telephone call to patient regarding EMMI stroke red alert. HIPAA verified with patient. RNCM introduced herself and explained reason for call. Patient states she has an appointment scheduled with her primary MD on 02/11/19.  Patient states she was scheduled for chemotherapy today but her appointment was cancelled and rescheduled for 02/15/19.   Patient states she still has patches on her chest from her heart monitor in the hospital. She states she is supposed to receive a heart monitor by mail to apply and wear. Patient states she has not received the heart monitor as of yet. RNCM gave patient contact phone number for [facility] heartcare to contact this week if monitor not received.  Patient states she was told by the hospital doctor that 1 month  free medication was procured for her and sent to the [place] outpatient pharmacy.  Patient states she is unsure which medication was sent.  Patient states she has all of her prescribed medicine at this time. She states she picked up refills after her discharge from the hospital. RNCM offered to contact [facility] outpatient pharmacy regarding medication. Patient verbally agreed. Patient states she has tested positive for COVID 19 and she and her family are in quarantine.  Patient states she would appreciate having her medication mailed to her if available.   RNCM contacted [place] outpatient pharmacy and spoke with [facility].  Berna Spare states patient has a prescription for Lidocain/-prilocaine at the [facility]. Berna Spare states there is no information stating patient is to receive medication free.  She states patient would have been given a match letter from the social worker/ case manager if the medication is to be offered at a  low/ no cost amount.  Tashawn advised RNCM and or patient to contact [place] regarding mailing medication.  RNCM contacted patient regarding cost. Patient states she does not have any information from a case Freight forwarder / Education officer, museum.  Patient states she would still like to get the medication. She states she will pay for it. Patient request medication be mailed to her.  RNCM advised patient to contact [facility] to provide information and payment.  RNCM provided patient contact phone number for [facility] pharmacy.  Patient verbalized appreciation for assistance.    PLAN: RNCM will close due to patient being assessed and having no further needs.   Quinn Plowman RN,BSN,CCM [facility] Telephonic  [phone]

## 2019-02-02 NOTE — Telephone Encounter (Signed)
Scheduled appt per sch msg. Called and spoke with patient. Confirmed date and time °

## 2019-02-03 ENCOUNTER — Telehealth: Payer: Self-pay | Admitting: Radiology

## 2019-02-03 NOTE — Telephone Encounter (Signed)
Enrolled patient for a 30 Day Preventice Event monitor to be mailed. Brief instructions were gone over with patient and she knows to expect the monitor to arrive in 3-4 days

## 2019-02-03 NOTE — Telephone Encounter (Signed)
6/2 texted link to pt and LM - pt is to f/u with neuro for stroke

## 2019-02-09 ENCOUNTER — Other Ambulatory Visit: Payer: Medicare Other

## 2019-02-09 ENCOUNTER — Ambulatory Visit: Payer: Medicare Other | Admitting: Internal Medicine

## 2019-02-09 ENCOUNTER — Ambulatory Visit: Payer: Medicare Other

## 2019-02-09 ENCOUNTER — Ambulatory Visit (INDEPENDENT_AMBULATORY_CARE_PROVIDER_SITE_OTHER): Payer: Medicare Other

## 2019-02-09 DIAGNOSIS — I639 Cerebral infarction, unspecified: Secondary | ICD-10-CM

## 2019-02-09 DIAGNOSIS — I4891 Unspecified atrial fibrillation: Secondary | ICD-10-CM | POA: Diagnosis not present

## 2019-02-10 ENCOUNTER — Other Ambulatory Visit: Payer: Self-pay

## 2019-02-10 NOTE — Patient Outreach (Signed)
[facility]) Care Management  02/10/2019  Debra Kidd [birth year] [id number]   EMMI: stroke red alert Referral date:  02/10/19 Referral reason: Lost interest in things they used to enjoy: yes,   Sad, hopeless, anxious, empty: yes Insurance:  Medicare Day # 9  Telephone call to patient regarding EMMI stroke red alert. HIPAA verified with patient. RNCM introduced herself and explained reason for call. Patient states she has had feelings of depression since the stroke.  Patient states," if I can get my hand and leg straight I will feel better."  Patient states she has left sided arm/ leg weakness from the stroke.  Patient states she is not on any medication for depression/ anxiety. RNCM discussed [facility] care management social work services to address anxiety/ depression symptoms. Patient declined.  RNCM advised patient to discuss the anxiety/depression symptoms at her upcoming primary MD visit on 02/11/19. Patient verbalized understanding.  Patient states she was asked to go to a skilled nursing facility for rehab after discharge from the hospital. Patient states she declined. Patient state she declined home health therapy as well.  Patient states she has 4 dogs at home.   Patient states she has been working on exercises at home on her own for her left arm/ leg.  Patient states she is taking her medications as prescribed. Patient states she has a follow up visit with her doctor on 02/11/19.  She states she has transportation to her appointments.   Patient reports she did not have a good experience at the hospital.  She states she did not feel she received the best care.  RNCM apologized for patients experience and offered to provide patient with the contact phone number for the patient experience department at the hospital. Patient declined stating, " Its not going to help anyway." RNCM patient to contact the patient experience department to discuss her concerns.  Explained that this  provided feedback for improvements.  Patient declined.  Patient reports she tested positive for COVID 19 while in the hospital. She states she is concerned that COVID 19 may have caused her stroke. Patient denies any ongoing symptoms related to COVID 19.  RNCM advised patient to notify MD of any changes in condition prior to scheduled appointment. RNCM verified patient aware of 911 services for urgent/ emergent needs. RNCM again offered patient follow up with [facility] care management. Patient declined.   PLAN; RNCM will close case due to patient refusing services.   Quinn Plowman RN,BSN,CCM [facility] Telephonic  [phone]       +                PLAN:

## 2019-02-15 ENCOUNTER — Other Ambulatory Visit: Payer: Self-pay | Admitting: Medical Oncology

## 2019-02-15 ENCOUNTER — Telehealth: Payer: Self-pay | Admitting: General Practice

## 2019-02-15 ENCOUNTER — Telehealth: Payer: Self-pay | Admitting: Medical Oncology

## 2019-02-15 DIAGNOSIS — Z20822 Contact with and (suspected) exposure to covid-19: Secondary | ICD-10-CM

## 2019-02-15 NOTE — Telephone Encounter (Signed)
COVID testing f/u -  Message sent to [place] pool to schedule COVID test before she comes back to cancer center. Pt notified to expect a call.

## 2019-02-15 NOTE — Telephone Encounter (Signed)
Pt has been scheduled for Covid testing. Scheduled with pt directly.   Pt was referred by [name] MD

## 2019-02-15 NOTE — Addendum Note (Signed)
Addended by: Denman George on: 02/15/2019 04:37 PM   Modules accepted: Orders

## 2019-02-15 NOTE — Telephone Encounter (Signed)
-----   Message from Ardeen Garland, RN sent at 02/15/2019 11:46 AM EDT ----- Regarding: COvid test Please schedule covid test .She had positive result 18 days ago and cannot come back to cancer center until result is negative. Pt will need specific directions to testing site.

## 2019-02-16 ENCOUNTER — Other Ambulatory Visit: Payer: Medicare Other

## 2019-02-16 ENCOUNTER — Inpatient Hospital Stay: Payer: Medicare Other

## 2019-02-16 ENCOUNTER — Inpatient Hospital Stay: Payer: Medicare Other | Admitting: Internal Medicine

## 2019-02-16 DIAGNOSIS — Z20822 Contact with and (suspected) exposure to covid-19: Secondary | ICD-10-CM

## 2019-02-17 LAB — NOVEL CORONAVIRUS, NAA: SARS-CoV-2, NAA: NOT DETECTED

## 2019-02-19 ENCOUNTER — Telehealth: Payer: Self-pay | Admitting: Medical Oncology

## 2019-02-19 NOTE — Telephone Encounter (Addendum)
Called to say she is covid negative . She missed treatment last week. When should she come back? Message to Carter.

## 2019-02-23 ENCOUNTER — Telehealth: Payer: Self-pay | Admitting: Internal Medicine

## 2019-02-23 NOTE — Telephone Encounter (Signed)
Scheduled appt per 6/23 sch message - pt is aware of appt date and time

## 2019-02-25 ENCOUNTER — Other Ambulatory Visit: Payer: Self-pay

## 2019-02-25 ENCOUNTER — Inpatient Hospital Stay (HOSPITAL_BASED_OUTPATIENT_CLINIC_OR_DEPARTMENT_OTHER): Payer: Medicare Other | Admitting: Physician Assistant

## 2019-02-25 ENCOUNTER — Encounter: Payer: Self-pay | Admitting: Physician Assistant

## 2019-02-25 ENCOUNTER — Inpatient Hospital Stay: Payer: Medicare Other

## 2019-02-25 ENCOUNTER — Inpatient Hospital Stay: Payer: Medicare Other | Attending: Internal Medicine

## 2019-02-25 VITALS — BP 130/87 | HR 80 | Temp 97.7°F | Resp 18 | Ht 63.0 in | Wt 126.8 lb

## 2019-02-25 DIAGNOSIS — C3491 Malignant neoplasm of unspecified part of right bronchus or lung: Secondary | ICD-10-CM

## 2019-02-25 DIAGNOSIS — G894 Chronic pain syndrome: Secondary | ICD-10-CM

## 2019-02-25 DIAGNOSIS — E785 Hyperlipidemia, unspecified: Secondary | ICD-10-CM | POA: Diagnosis not present

## 2019-02-25 DIAGNOSIS — Z9221 Personal history of antineoplastic chemotherapy: Secondary | ICD-10-CM | POA: Insufficient documentation

## 2019-02-25 DIAGNOSIS — Z5112 Encounter for antineoplastic immunotherapy: Secondary | ICD-10-CM | POA: Diagnosis present

## 2019-02-25 DIAGNOSIS — C3411 Malignant neoplasm of upper lobe, right bronchus or lung: Secondary | ICD-10-CM | POA: Diagnosis not present

## 2019-02-25 DIAGNOSIS — Z85828 Personal history of other malignant neoplasm of skin: Secondary | ICD-10-CM | POA: Diagnosis not present

## 2019-02-25 DIAGNOSIS — C7989 Secondary malignant neoplasm of other specified sites: Secondary | ICD-10-CM | POA: Insufficient documentation

## 2019-02-25 DIAGNOSIS — F17210 Nicotine dependence, cigarettes, uncomplicated: Secondary | ICD-10-CM

## 2019-02-25 DIAGNOSIS — Z8673 Personal history of transient ischemic attack (TIA), and cerebral infarction without residual deficits: Secondary | ICD-10-CM

## 2019-02-25 DIAGNOSIS — Z79899 Other long term (current) drug therapy: Secondary | ICD-10-CM | POA: Diagnosis not present

## 2019-02-25 DIAGNOSIS — F329 Major depressive disorder, single episode, unspecified: Secondary | ICD-10-CM

## 2019-02-25 DIAGNOSIS — I6522 Occlusion and stenosis of left carotid artery: Secondary | ICD-10-CM

## 2019-02-25 DIAGNOSIS — R5383 Other fatigue: Secondary | ICD-10-CM

## 2019-02-25 DIAGNOSIS — U071 COVID-19: Secondary | ICD-10-CM | POA: Insufficient documentation

## 2019-02-25 DIAGNOSIS — R5382 Chronic fatigue, unspecified: Secondary | ICD-10-CM

## 2019-02-25 DIAGNOSIS — Z923 Personal history of irradiation: Secondary | ICD-10-CM | POA: Diagnosis not present

## 2019-02-25 DIAGNOSIS — I10 Essential (primary) hypertension: Secondary | ICD-10-CM

## 2019-02-25 DIAGNOSIS — Z23 Encounter for immunization: Secondary | ICD-10-CM

## 2019-02-25 LAB — CBC WITH DIFFERENTIAL (CANCER CENTER ONLY)
Abs Immature Granulocytes: 0.02 10*3/uL (ref 0.00–0.07)
Basophils Absolute: 0 10*3/uL (ref 0.0–0.1)
Basophils Relative: 0 %
Eosinophils Absolute: 0.1 10*3/uL (ref 0.0–0.5)
Eosinophils Relative: 2 %
HCT: 33.6 % — ABNORMAL LOW (ref 36.0–46.0)
Hemoglobin: 11.7 g/dL — ABNORMAL LOW (ref 12.0–15.0)
Immature Granulocytes: 0 %
Lymphocytes Relative: 20 %
Lymphs Abs: 1.7 10*3/uL (ref 0.7–4.0)
MCH: 39.1 pg — ABNORMAL HIGH (ref 26.0–34.0)
MCHC: 34.8 g/dL (ref 30.0–36.0)
MCV: 112.4 fL — ABNORMAL HIGH (ref 80.0–100.0)
Monocytes Absolute: 0.5 10*3/uL (ref 0.1–1.0)
Monocytes Relative: 6 %
Neutro Abs: 6.3 10*3/uL (ref 1.7–7.7)
Neutrophils Relative %: 72 %
Platelet Count: 266 10*3/uL (ref 150–400)
RBC: 2.99 MIL/uL — ABNORMAL LOW (ref 3.87–5.11)
RDW: 12.4 % (ref 11.5–15.5)
WBC Count: 8.7 10*3/uL (ref 4.0–10.5)
nRBC: 0 % (ref 0.0–0.2)

## 2019-02-25 LAB — CMP (CANCER CENTER ONLY)
ALT: 14 U/L (ref 0–44)
AST: 16 U/L (ref 15–41)
Albumin: 3.4 g/dL — ABNORMAL LOW (ref 3.5–5.0)
Alkaline Phosphatase: 148 U/L — ABNORMAL HIGH (ref 38–126)
Anion gap: 10 (ref 5–15)
BUN: 4 mg/dL — ABNORMAL LOW (ref 6–20)
CO2: 25 mmol/L (ref 22–32)
Calcium: 8.6 mg/dL — ABNORMAL LOW (ref 8.9–10.3)
Chloride: 102 mmol/L (ref 98–111)
Creatinine: 0.76 mg/dL (ref 0.44–1.00)
GFR, Est AFR Am: >60 mL/min (ref >60)
GFR, Estimated: >60 mL/min (ref >60)
Glucose, Bld: 96 mg/dL (ref 70–99)
Potassium: 3.7 mmol/L (ref 3.5–5.1)
Sodium: 137 mmol/L (ref 135–145)
Total Bilirubin: 0.4 mg/dL (ref 0.3–1.2)
Total Protein: 6.7 g/dL (ref 6.5–8.1)

## 2019-02-25 LAB — TSH: TSH: 1.58 u[IU]/mL (ref 0.308–3.960)

## 2019-02-25 MED ORDER — SODIUM CHLORIDE 0.9 % IV SOLN
Freq: Once | INTRAVENOUS | Status: AC
  Administered 2019-02-25: 15:00:00 via INTRAVENOUS
  Filled 2019-02-25: qty 250

## 2019-02-25 MED ORDER — LIDOCAINE-PRILOCAINE 2.5-2.5 % EX CREA: 1.0000 "application " | TOPICAL_CREAM | CUTANEOUS | 0 refills | Status: DC | PRN

## 2019-02-25 MED ORDER — SODIUM CHLORIDE 0.9% FLUSH
10.0000 mL | INTRAVENOUS | Status: DC | PRN
  Administered 2019-02-25: 10 mL
  Filled 2019-02-25: qty 10

## 2019-02-25 MED ORDER — SODIUM CHLORIDE 0.9 % IV SOLN
1200.0000 mg | Freq: Once | INTRAVENOUS | Status: AC
  Administered 2019-02-25: 1200 mg via INTRAVENOUS
  Filled 2019-02-25: qty 20

## 2019-02-25 MED ORDER — HEPARIN SOD (PORK) LOCK FLUSH 100 UNIT/ML IV SOLN
500.0000 [IU] | Freq: Once | INTRAVENOUS | Status: AC | PRN
  Administered 2019-02-25: 500 [IU]
  Filled 2019-02-25: qty 5

## 2019-02-25 MED ORDER — POTASSIUM CHLORIDE 20 MEQ/15ML (10%) PO SOLN: 40.0000 meq | Freq: Every day | ORAL | 0 refills | Status: DC

## 2019-02-25 MED FILL — LIDOCAINE-PRILOCAINE CREAM: 2.5-2.5 | 30 days supply | Qty: 30 | Fill #0

## 2019-02-25 NOTE — Patient Instructions (Signed)
[facility] Discharge Instructions for Patients Receiving Chemotherapy  Today you received the following chemotherapy agents: Atezolizumab [facility])  To help prevent nausea and vomiting after your treatment, we encourage you to take your nausea medication as directed.    If you develop nausea and vomiting that is not controlled by your nausea medication, call the clinic.   BELOW ARE SYMPTOMS THAT SHOULD BE REPORTED IMMEDIATELY:  *FEVER GREATER THAN 100.5 F  *CHILLS WITH OR WITHOUT FEVER  NAUSEA AND VOMITING THAT IS NOT CONTROLLED WITH YOUR NAUSEA MEDICATION  *UNUSUAL SHORTNESS OF BREATH  *UNUSUAL BRUISING OR BLEEDING  TENDERNESS IN MOUTH AND THROAT WITH OR WITHOUT PRESENCE OF ULCERS  *URINARY PROBLEMS  *BOWEL PROBLEMS  UNUSUAL RASH Items with * indicate a potential emergency and should be followed up as soon as possible.  Feel free to call the clinic should you have any questions or concerns. The clinic phone number is [phone].  Please show the [facility] at check-in to the Emergency Department and triage nurse.

## 2019-02-25 NOTE — Progress Notes (Signed)
Per Cassie Heilingoetter, PA wants PCP Debra Kidd to reorder medications ordered by hospitalist until patient is established with Vascular and Neurology.  Attempted to call Debra Kidd's office [phone] but office is closed and will not reopen until June 29.  Spoke with nurse as [facility].  They will contact patient to get scheduled to be seen.

## 2019-02-25 NOTE — Progress Notes (Signed)
Time OFFICE PROGRESS NOTE  Debra Roers, MD [address] [place] [address]  DIAGNOSIS: Extensive stage (T2a, N3, [name], large right anterior mediastinal and supraclavicular lymphadenopathy as well as pancreatic and splenic metastasis diagnosed in September 2017. The patient had disease progression in October 2019  PRIOR THERAPY:  1) Systemic chemotherapy was carboplatin for AUC of 5 on day 1 and etoposide 100 MG/M2 on days 1, 2 and 3 with Neulasta support. Status post 6 cycles with significant response of her disease. 2) Prophylactic cranial irradiation under the care of Dr. Sondra Come on 12/02/2016. 3) stereotactic radiotherapy to the recurrent right upper lobe pulmonary nodule under the care of Dr. Sondra Come completed November 10, 2017. 4)Retreatment with systemic chemotherapy with carboplatin for AUC of 5 on day 1 and etoposide 100 mg/M2 on days 1, 2 and 3 as well as Tecentriq (Atezolizumab) 1200 mg IV every 3 weeks with Neulasta support. First dose June 22, 2018 for disease recurrence. Status post 5 cycles. Starting from cycle #2 her dose of carboplatin will be reduced to AUC of 4 and etoposide 80 mg/M2 on days 1, 2 and 3 in addition to the regular dose of Tecentriq.  CURRENT THERAPY: Maintenance treatment with single agent Tecentriq 1200 mg IV every 3 weeks. Status post 6cycles.  INTERVAL HISTORY: Debra Kidd 58 y.o. female returns to the clinic for a follow-up visit.  The patient was hospitalized from 01/28/2019-01/30/2019 due to having a stroke in the right PCA.  She was also found to be COVID positive although she was asymptomatic.  Since being discharged, the patient has been feeling fairly well.  She denies any nausea, vomiting, diarrhea, or constipation.  She denies any fever, chills, night sweats, or weight loss.  She denies any chest pain, shortness of breath, cough, or hemoptysis. She is currently  wearing a cardiac event monitor. She denies any headaches.  Unfortunately, the patient continues to smoke over pack a day of cigarettes. The patient has tolerated her treatment Tecentriq well without any adverse effects.  She is here today for evaluation and to discuss resuming her treatment with Tecentriq before starting cycle #6 of maintenance Tecentriq today.  MEDICAL HISTORY: Past Medical History:  Diagnosis Date  . 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 Blood type, Rh positive   . Cancer ([facility])   . Chronic pain syndrome   . Depression 08/07/2016  . Encounter for antineoplastic chemotherapy 07/17/2016  . History of external beam radiation therapy 11/19/16-12/02/16   brain 25 Gy in 10 fractions  . Hyperlipidemia   . Hypertension   . Hypertension 08/07/2016  . Osteoporosis   . Small cell lung cancer ([facility])     ALLERGIES:  is allergic to codeine; motrin [ibuprofen]; thiazide-type diuretics; and vicodin [hydrocodone-acetaminophen].  MEDICATIONS:  Current Outpatient Medications  Medication Sig Dispense Refill  . aspirin EC 81 MG tablet Take 1 tablet (81 mg total) by mouth daily. 150 tablet 2  . atorvastatin (LIPITOR) 80 MG tablet Take 1 tablet (80 mg total) by mouth daily at 6 PM. 30 tablet 0  . celecoxib (CELEBREX) 200 MG capsule Take 200 mg by mouth daily as needed for mild pain.     Marland Kitchen clopidogrel (PLAVIX) 75 MG tablet Take 1 tablet (75 mg total) by mouth daily. 30 tablet 0  . diazepam (VALIUM) 5 MG tablet Take 5 mg by mouth 3 (three)  times daily as needed for anxiety.   2  . lidocaine-prilocaine (EMLA) cream Apply 1 application topically as needed. 30 g 0  . magnesium oxide (MAG-OX) 400 (241.3 Mg) MG tablet Take 1 tablet (400 mg total) by mouth daily. 30 tablet 0  . oxyCODONE-acetaminophen (PERCOCET) 5-325 MG tablet Take 1 tablet by mouth every 6 (six) hours as needed for severe pain. 30 tablet 0  . potassium chloride 20 MEQ/15ML (10%) SOLN Take 30 mLs (40 mEq total) by mouth  daily. 90 mL 0  . prochlorperazine (COMPAZINE) 10 MG tablet Take 1 tablet (10 mg total) by mouth every 6 (six) hours as needed for nausea or vomiting. 30 tablet 0  . traMADol (ULTRAM) 50 MG tablet Take 50 mg by mouth every 6 (six) hours as needed for moderate pain.     . diphenhydrAMINE (BENADRYL) 25 MG tablet Take 25 mg by mouth every 6 (six) hours as needed for itching or allergies.     Marland Kitchen loperamide (IMODIUM A-D) 2 MG tablet Take 2 mg by mouth 4 (four) times daily as needed for diarrhea or loose stools.    . mirtazapine (REMERON) 15 MG tablet Take 1 tablet (15 mg total) by mouth at bedtime. (Patient not taking: Reported on 02/25/2019) 30 tablet 1  . polyethylene glycol (MIRALAX / GLYCOLAX) packet Take 17 g by mouth daily. (Patient not taking: Reported on 10/06/2018) 14 each 0  . senna-docusate (SENOKOT-S) 8.6-50 MG tablet Take 2 tablets by mouth at bedtime. (Patient not taking: Reported on 10/27/2018) 5 tablet 0  . Vitamin D, Ergocalciferol, (DRISDOL) 1.25 MG (50000 UT) CAPS capsule Take 1 capsule (50,000 Units total) by mouth every 7 (seven) days. (Patient not taking: Reported on 01/28/2019) 5 capsule 0   No current facility-administered medications for this visit.    Facility-Administered Medications Ordered in Other Visits  Medication Dose Route Frequency Provider Last Rate Last Dose  . sodium chloride flush (NS) 0.9 % injection 10 mL  10 mL Intracatheter PRN [name], MD   10 mL at 02/25/19 1604    SURGICAL HISTORY:  Past Surgical History:  Procedure Laterality Date  . ABDOMINAL HYSTERECTOMY  1981  . APPENDECTOMY     at age 21  . EYE SURGERY     left  . IR GENERIC HISTORICAL  06/03/2016   IR FLUORO GUIDE PORT INSERTION RIGHT 06/03/2016 [facility]  . IR GENERIC HISTORICAL  06/03/2016   IR US GUIDE VASC ACCESS RIGHT 06/03/2016 [facility]  . SKIN CANCER EXCISION     basal cell carcinoma L side of face    REVIEW OF SYSTEMS:   Review of Systems  Constitutional: Negative for  appetite change, chills, fatigue, [name], nosebleeds, sore throat and trouble swallowing.   Eyes: Negative for eye problems and icterus.  Respiratory: Negative for cough, hemoptysis, [name], constipation, diarrhea, nausea and vomiting.  Genitourinary: Negative for bladder incontinence, difficulty urinating, dysuria, frequency and hematuria.   Musculoskeletal: Negative for back pain, gait problem, neck pain and neck stiffness.  Skin: Negative for itching and rash.  Neurological: Positive for left sided weakness. Negative for dizziness, gait problem, headaches, [name], depression and sleep disturbance. The patient is not nervous/anxious.     PHYSICAL EXAMINATION:  Blood pressure 130/87, pulse 80, temperature 97.7 F (36.5 C), temperature source Oral, resp. rate 18, height 5\' 3"  (1.6 m), weight 126 lb 12.8 oz (57.5 kg).  ECOG PERFORMANCE STATUS: 1 - Symptomatic but completely ambulatory  Physical Exam  Constitutional: Oriented to person, place, and time and well-developed, well-nourished, and in no distress.   HENT:  Head: Normocephalic and atraumatic.  Mouth/Throat: Oropharynx is clear and moist. No oropharyngeal exudate.  Eyes: Conjunctivae are normal. Right eye exhibits no discharge. Left eye exhibits no discharge. No scleral icterus.  Neck: Normal range of motion. Neck supple.  Cardiovascular: Normal rate, regular rhythm, normal heart sounds and intact distal pulses.   Pulmonary/Chest: Effort normal and breath sounds normal. No respiratory distress. No wheezes. No rales.  Abdominal: Soft. Bowel sounds are normal. Exhibits no distension and no mass.  There is no tenderness.  Musculoskeletal: Normal range of motion. Exhibits no edema.  Lymphadenopathy:    No cervical adenopathy.  Neurological: Alert and oriented to person, place, and time.  Skin: Skin is warm and dry. No rash noted. Not diaphoretic. No erythema. No pallor.  Psychiatric: Mood, memory and judgment normal.  Vitals reviewed.  LABORATORY DATA: Lab Results  Component Value Date   WBC 8.7 02/25/2019   HGB 11.7 (L) 02/25/2019   HCT 33.6 (L) 02/25/2019   MCV 112.4 (H) 02/25/2019   PLT 266 02/25/2019      Chemistry      Component Value Date/Time   NA 137 02/25/2019 1339   NA 141 08/01/2017 0835   K 3.7 02/25/2019 1339   K 3.5 08/01/2017 0835   CL 102 02/25/2019 1339   CO2 25 02/25/2019 1339   CO2 25 08/01/2017 0835   BUN 4 (L) 02/25/2019 1339   BUN 8.0 08/01/2017 0835   CREATININE 0.76 02/25/2019 1339   CREATININE 0.9 08/01/2017 0835      Component Value Date/Time   CALCIUM 8.6 (L) 02/25/2019 1339   CALCIUM 9.9 08/01/2017 0835   ALKPHOS 148 (H) 02/25/2019 1339   ALKPHOS 142 08/01/2017 0835   AST 16 02/25/2019 1339   AST 15 08/01/2017 0835   ALT 14 02/25/2019 1339   ALT 19 08/01/2017 0835   BILITOT 0.4 02/25/2019 1339   BILITOT 0.35 08/01/2017 0835       RADIOGRAPHIC STUDIES:  Ct Head Wo Contrast  Result Date: 01/29/2019 CLINICAL DATA:  Fall several days ago with left-sided numbness and weakness, initial encounter EXAM: CT HEAD WITHOUT CONTRAST TECHNIQUE: Contiguous axial images were obtained from the base of the skull through the vertex without intravenous contrast. COMPARISON:  11/07/2016 FINDINGS: Brain: Mild atrophic and chronic white matter ischemic changes are noted. No findings to suggest acute hemorrhage, acute infarction or space-occupying mass lesion are noted. Vascular: No hyperdense vessel or unexpected calcification. Skull: Normal. Negative for fracture or focal lesion. Sinuses/Orbits: No acute finding. Other: None. IMPRESSION: Mild atrophic  and chronic white matter ischemic changes. No acute abnormality noted. Electronically Signed   By: Inez Catalina M.D.   On: 01/29/2019 00:08   Mr Jodene Nam Neck W Wo Contrast  Result Date: 01/29/2019 CLINICAL DATA:  History of lung cancer. Left-sided weakness that began 4 days ago. EXAM: MRI HEAD WITHOUT AND WITH CONTRAST MRA HEAD WITHOUT CONTRAST MRA NECK WITHOUT AND WITH CONTRAST TECHNIQUE: Multiplanar, multiecho pulse sequences of the brain and surrounding structures were obtained without and with intravenous contrast. Angiographic images of the Circle of Willis were obtained using MRA technique without intravenous contrast. Angiographic images of the  neck were obtained using MRA technique without and with intravenous contrast. Carotid stenosis measurements (when applicable) are obtained utilizing NASCET criteria, using the distal internal carotid diameter as the denominator. CONTRAST:  5 mL Gadavist COMPARISON:  Head CT 01/28/2019 FINDINGS: MRI HEAD FINDINGS BRAIN: The midline structures are normal. There is multifocal acute ischemia within the right occipital lobe, medial right temporal lobe and at the right cerebral peduncle. Diffuse confluent hyperintense T2-weighted signal within the periventricular, deep and juxtacortical white matter, most commonly due to chronic ischemic microangiopathy. Generalized atrophy without lobar predilection. Blood-sensitive sequences show no chronic microhemorrhage or superficial siderosis. There is mild leptomeningeal contrast enhancement at site of right occipital ischemia. No mass lesion. SKULL AND UPPER CERVICAL SPINE: The visualized skull base, calvarium, upper cervical spine and extracranial soft tissues are normal. SINUSES/ORBITS: No fluid levels or advanced mucosal thickening. No mastoid or middle ear effusion. The orbits are normal. MRA HEAD FINDINGS POSTERIOR CIRCULATION: --Vertebral arteries: Limited flow-related enhancement of the right V4 segment. Normal left V4  segment. --Posterior inferior cerebellar arteries (PICA): Patent origins from the vertebral arteries. --Anterior inferior cerebellar arteries (AICA): Patent origins from the basilar artery. --Basilar artery: Normal. --Superior cerebellar arteries: Normal. --Posterior cerebral arteries (PCA): The right posterior cerebral artery is occluded near its origin. The left PCA is normal. ANTERIOR CIRCULATION: --Intracranial internal carotid arteries: Normal. --Anterior cerebral arteries (ACA): Normal. Both A1 segments are present. Patent anterior communicating artery (a-comm). --Middle cerebral arteries (MCA): Normal. MRA NECK FINDINGS Aortic arch: Normal 3 vessel aortic branching pattern. The visualized subclavian arteries are normal. Right carotid system: Normal course and caliber without stenosis or evidence of dissection. Left carotid system: There is severe stenosis of the proximal left common carotid artery. Left ICA is normal. Vertebral arteries: Normal left vertebral artery. Poor contrast enhancement of the right vertebral artery. IMPRESSION: 1. Multifocal subacute ischemia within the right PCA territory, including the right occipital lobe, medial right temporal lobe and right cerebral peduncle. 2. 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 3. Severely diminished flow within the right vertebral artery, which is likely severely stenotic and/or intermittently occluded. 4. Severe stenosis of the proximal left common carotid artery. Electronically Signed   By: Ulyses Jarred M.D.   On: 01/29/2019 19:28   Mr Jeri Cos VC Contrast  Result Date: 01/29/2019 CLINICAL DATA:  History of lung cancer. Left-sided weakness that began 4 days ago. EXAM: MRI HEAD WITHOUT AND WITH CONTRAST MRA HEAD WITHOUT CONTRAST MRA NECK WITHOUT AND WITH CONTRAST TECHNIQUE: Multiplanar, multiecho pulse sequences of the brain and surrounding structures were obtained without and with intravenous contrast. Angiographic images of the Circle of Willis  were obtained using MRA technique without intravenous contrast. Angiographic images of the neck were obtained using MRA technique without and with intravenous contrast. Carotid stenosis measurements (when applicable) are obtained utilizing NASCET criteria, using the distal internal carotid diameter as the denominator. CONTRAST:  5 mL Gadavist COMPARISON:  Head CT 01/28/2019 FINDINGS: MRI HEAD FINDINGS BRAIN: The midline structures are normal. There is multifocal acute ischemia within the right occipital lobe, medial right temporal lobe and at the right cerebral peduncle. Diffuse confluent hyperintense T2-weighted signal within the periventricular, deep and juxtacortical white matter, most commonly due to chronic ischemic microangiopathy. Generalized atrophy without lobar predilection. Blood-sensitive sequences show no chronic microhemorrhage or superficial siderosis. There is mild leptomeningeal contrast enhancement at site of right occipital ischemia. No mass lesion. SKULL AND UPPER CERVICAL SPINE: The visualized skull base, calvarium, upper cervical spine and extracranial  soft tissues are normal. SINUSES/ORBITS: No fluid levels or advanced mucosal thickening. No mastoid or middle ear effusion. The orbits are normal. MRA HEAD FINDINGS POSTERIOR CIRCULATION: --Vertebral arteries: Limited flow-related enhancement of the right V4 segment. Normal left V4 segment. --Posterior inferior cerebellar arteries (PICA): Patent origins from the vertebral arteries. --Anterior inferior cerebellar arteries (AICA): Patent origins from the basilar artery. --Basilar artery: Normal. --Superior cerebellar arteries: Normal. --Posterior cerebral arteries (PCA): The right posterior cerebral artery is occluded near its origin. The left PCA is normal. ANTERIOR CIRCULATION: --Intracranial internal carotid arteries: Normal. --Anterior cerebral arteries (ACA): Normal. Both A1 segments are present. Patent anterior communicating artery (a-comm).  --Middle cerebral arteries (MCA): Normal. MRA NECK FINDINGS Aortic arch: Normal 3 vessel aortic branching pattern. The visualized subclavian arteries are normal. Right carotid system: Normal course and caliber without stenosis or evidence of dissection. Left carotid system: There is severe stenosis of the proximal left common carotid artery. Left ICA is normal. Vertebral arteries: Normal left vertebral artery. Poor contrast enhancement of the right vertebral artery. IMPRESSION: 1. Multifocal subacute ischemia within the right PCA territory, including the right occipital lobe, medial right temporal lobe and right cerebral peduncle. 2. 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 3. Severely diminished flow within the right vertebral artery, which is likely severely stenotic and/or intermittently occluded. 4. Severe stenosis of the proximal left common carotid artery. Electronically Signed   By: Ulyses Jarred M.D.   On: 01/29/2019 19:28   Mr Jodene Nam Head Wo Contrast  Result Date: 01/29/2019 CLINICAL DATA:  History of lung cancer. Left-sided weakness that began 4 days ago. EXAM: MRI HEAD WITHOUT AND WITH CONTRAST MRA HEAD WITHOUT CONTRAST MRA NECK WITHOUT AND WITH CONTRAST TECHNIQUE: Multiplanar, multiecho pulse sequences of the brain and surrounding structures were obtained without and with intravenous contrast. Angiographic images of the Circle of Willis were obtained using MRA technique without intravenous contrast. Angiographic images of the neck were obtained using MRA technique without and with intravenous contrast. Carotid stenosis measurements (when applicable) are obtained utilizing NASCET criteria, using the distal internal carotid diameter as the denominator. CONTRAST:  5 mL Gadavist COMPARISON:  Head CT 01/28/2019 FINDINGS: MRI HEAD FINDINGS BRAIN: The midline structures are normal. There is multifocal acute ischemia within the right occipital lobe, medial right temporal lobe and at the right cerebral  peduncle. Diffuse confluent hyperintense T2-weighted signal within the periventricular, deep and juxtacortical white matter, most commonly due to chronic ischemic microangiopathy. Generalized atrophy without lobar predilection. Blood-sensitive sequences show no chronic microhemorrhage or superficial siderosis. There is mild leptomeningeal contrast enhancement at site of right occipital ischemia. No mass lesion. SKULL AND UPPER CERVICAL SPINE: The visualized skull base, calvarium, upper cervical spine and extracranial soft tissues are normal. SINUSES/ORBITS: No fluid levels or advanced mucosal thickening. No mastoid or middle ear effusion. The orbits are normal. MRA HEAD FINDINGS POSTERIOR CIRCULATION: --Vertebral arteries: Limited flow-related enhancement of the right V4 segment. Normal left V4 segment. --Posterior inferior cerebellar arteries (PICA): Patent origins from the vertebral arteries. --Anterior inferior cerebellar arteries (AICA): Patent origins from the basilar artery. --Basilar artery: Normal. --Superior cerebellar arteries: Normal. --Posterior cerebral arteries (PCA): The right posterior cerebral artery is occluded near its origin. The left PCA is normal. ANTERIOR CIRCULATION: --Intracranial internal carotid arteries: Normal. --Anterior cerebral arteries (ACA): Normal. Both A1 segments are present. Patent anterior communicating artery (a-comm). --Middle cerebral arteries (MCA): Normal. MRA NECK FINDINGS Aortic arch: Normal 3 vessel aortic branching pattern. The visualized subclavian arteries are normal. Right  carotid system: Normal course and caliber without stenosis or evidence of dissection. Left carotid system: There is severe stenosis of the proximal left common carotid artery. Left ICA is normal. Vertebral arteries: Normal left vertebral artery. Poor contrast enhancement of the right vertebral artery. IMPRESSION: 1. Multifocal subacute ischemia within the right PCA territory, including the right  occipital lobe, medial right temporal lobe and right cerebral peduncle. 2. 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 3. Severely diminished flow within the right vertebral artery, which is likely severely stenotic and/or intermittently occluded. 4. Severe stenosis of the proximal left common carotid artery. Electronically Signed   By: Ulyses Jarred M.D.   On: 01/29/2019 19:28   Vas [place] Lower Extremity Venous (dvt)  Result Date: 01/30/2019  Lower Venous Study Indications: Stroke, and Covid-19 positive.  Limitations: Body habitus. Comparison Study: No prior study on file for comparison. Performing Technologist: Sharion Dove RVS  Examination Guidelines: A complete evaluation includes B-mode imaging, spectral Doppler, color Doppler, and power Doppler as needed of all accessible portions of each vessel. Bilateral testing is considered an integral part of a complete examination. Limited examinations for reoccurring indications may be performed as noted.  +---------+---------------+---------+-----------+----------+-------+ RIGHT    CompressibilityPhasicitySpontaneityPropertiesSummary +---------+---------------+---------+-----------+----------+-------+ CFV      Full           Yes      Yes                          +---------+---------------+---------+-----------+----------+-------+ SFJ      Full                                                 +---------+---------------+---------+-----------+----------+-------+ FV Prox  Full                                                 +---------+---------------+---------+-----------+----------+-------+ FV Mid   Full                                                 +---------+---------------+---------+-----------+----------+-------+ FV DistalFull                                                 +---------+---------------+---------+-----------+----------+-------+ PFV      Full                                                  +---------+---------------+---------+-----------+----------+-------+ POP      Full           Yes      Yes                          +---------+---------------+---------+-----------+----------+-------+ PTV      Full                                                 +---------+---------------+---------+-----------+----------+-------+  PERO     Full                                                 +---------+---------------+---------+-----------+----------+-------+   +---------+---------------+---------+-----------+----------+-------------------+ LEFT     CompressibilityPhasicitySpontaneityPropertiesSummary             +---------+---------------+---------+-----------+----------+-------------------+ CFV      Full           Yes      Yes                                      +---------+---------------+---------+-----------+----------+-------------------+ SFJ      Full                                                             +---------+---------------+---------+-----------+----------+-------------------+ FV Prox  Full                                                             +---------+---------------+---------+-----------+----------+-------------------+ FV Mid   Full                                                             +---------+---------------+---------+-----------+----------+-------------------+ FV DistalFull                                                             +---------+---------------+---------+-----------+----------+-------------------+ PFV      Full                                                             +---------+---------------+---------+-----------+----------+-------------------+ POP      Full           Yes      Yes                                      +---------+---------------+---------+-----------+----------+-------------------+ PTV      Full                                                              +---------+---------------+---------+-----------+----------+-------------------+  PERO     Full                                         all segments not                                                          visualized          +---------+---------------+---------+-----------+----------+-------------------+     Summary: Right: There is no evidence of deep vein thrombosis in the lower extremity. Left: There is no evidence of deep vein thrombosis in the lower extremity. However, portions of this examination were limited- see technologist comments above.  *See table(s) above for measurements and observations. Electronically signed by Monica Martinez MD on 01/30/2019 at 4:37:19 PM.    Final      ASSESSMENT/PLAN:  This is a very pleasant 58 year old Caucasian female with extensive stage small cell lung cancer.  She presented with a right upper lobe lung mass, large right anterior mediastinal and supraclavicular lymphadenopathy as well as a pancreatic and splenic metastasis.  She was diagnosed in September 2017.   The patient underwent systemic chemotherapy with carboplatin and etoposide.  She is status post 6 cycles.  She tolerated treatment well except for chemotherapy-induced anemia which required  pRBCs and platelet transfusions.  She had a significant improvement of her disease with chemotherapy.  The patient then underwent prophylactic cranial irradiation.  She then underwent stereotactic radiotherapy to the right upper lobe and pulmonary nodule.  She had been on observation for 2 years before showing evidence of disease progression.  She recently had been started on systemic chemotherapy with carboplatin, etoposide, and Tecentriq.  She is status post 10 cycles.  Starting from cycle #5, she has been on maintenance single agent Tecentriq.  She has been tolerating treatment well.   The patient was recently hospitalized for a CVA.  She was also found to be COVID positive, although she  was asymptomatic.  She is here today to discuss resuming her immunotherapy with Tecentriq.  The patient seen with Dr. Julien Nordmann today.  Labs were reviewed with the patient.  We recommend for her to proceed with cycle #11 today as scheduled.  I will arrange for the patient to have a restaging CT scan of the chest, abdomen, and pelvis performed prior to her next visit.   The patient will return for a follow-up visit in 3 weeks for evaluation and to review her scan results.  Since being discharged from the hospital, the patient has referrals placed to neurology as well as vascular surgery.  It appears that these offices have tried to reach out to the patient, but they were unable to reach her due to her voicemail box not being set up. We have reached out to these offices regarding scheduling following up appointments with these offices. I alerted the patient to set up her voicemail as well as to be aware that these offices will be in contact with her soon.  We have also reached out to the patient's primary care provider to assist with managing the  patient's new medications since being discharged from the hospital for her stroke.  I sent a refill for the EMLA cream and the patient  supplemental potassium to her pharmacy.  The patient was advised to call immediately if she has any concerning symptoms in the interval. The patient voices understanding of current disease status and treatment options and is in agreement with the current care plan. All questions were answered. The patient knows to call the clinic with any problems, questions or concerns. We can certainly see the patient much sooner if necessary .   Orders Placed This Encounter  Procedures  . CT Abdomen Pelvis W Contrast    Patient would like the water based contrast. Please discuss with patient any special instructions for arrival time due to this.    Standing Status:   Future    Standing Expiration Date:   02/25/2020    Scheduling  Instructions:     Patient would like the water based contrast. Please discuss with patient any special instructions for arrival time due to this.    Order Specific Question:   ** REASON FOR EXAM (FREE TEXT)    Answer:   Restaging Lung Cancer    Order Specific Question:   If indicated for the ordered procedure, I authorize the administration of contrast media per Radiology protocol    Answer:   Yes    Order Specific Question:   Is patient pregnant?    Answer:   No    Order Specific Question:   Preferred imaging location?    Answer:   [facility]    Order Specific Question:   Is Oral Contrast requested for this exam?    Answer:   Yes, Per Radiology protocol    Order Specific Question:   Radiology Contrast Protocol - do NOT remove file path    Answer:   \\[email or web address]\CTProtocols.pdf  . CT Chest W Contrast    Patient would like the water based contrast. Please discuss with patient any special instructions for arrival time due to this.    Standing Status:   Future    Standing Expiration Date:   02/25/2020    Scheduling Instructions:     Patient would like the water based contrast. Please discuss with patient any special instructions for arrival time due to this.    Order Specific Question:   ** REASON FOR EXAM (FREE TEXT)    Answer:   Restaging Lung Cancer    Order Specific Question:   If indicated for the ordered procedure, I authorize the administration of contrast media per Radiology protocol    Answer:   Yes    Order Specific Question:   Is patient pregnant?    Answer:   No    Order Specific Question:   Preferred imaging location?    Answer:   [facility]    Order Specific Question:   Radiology Contrast Protocol - do NOT remove file path    Answer:   \\[email or web address].pdf  . TSH    Standing Status:   Standing    Number of Occurrences:   5    Standing Expiration Date:   02/25/2020     Tobe Sos Emie Sommerfeld,  PA-C 02/25/19  ADDENDUM: Hematology/Oncology Attending: I had a face-to-face encounter with the patient today.  I recommended her care plan.  This is a very pleasant 58 years old white female with extensive stage small cell lung cancer diagnosed in September 2017 status post several chemotherapy regimens and most recently treated with systemic chemotherapy with carboplatin, etoposide and Tecentriq with partial response.  She is currently on maintenance treatment with Tecentriq status post 6 cycles.  She has been tolerating this treatment well with no concerning adverse effects.  She was recently admitted to the the hospital few weeks ago with COVID19 infection as well as left-sided weakness and she was diagnosed with acute ischemic stroke.  She is feeling much better.  She was a started on several medication with follow-up with neurology but unfortunately she did not follow-up with them as recommended.  She is here today for evaluation before resuming her treatment. The patient is feeling fine today with no concerning complaints.  I recommended for her to resume her treatment with Tecentriq today. We will see her back for follow-up visit in 3 weeks for evaluation with repeat CT scan of the chest, abdomen and pelvis for restaging of her disease. She was advised to call immediately if she has any concerning symptoms in the interval.  Disclaimer: This note was dictated with voice recognition software. Similar sounding words can inadvertently be transcribed and may be missed upon review. Eilleen Kempf, MD 02/25/19

## 2019-03-01 ENCOUNTER — Other Ambulatory Visit: Payer: Self-pay | Admitting: Physician Assistant

## 2019-03-01 ENCOUNTER — Telehealth: Payer: Self-pay | Admitting: *Deleted

## 2019-03-01 NOTE — Telephone Encounter (Signed)
Pt spouse called states they will need a refill on K+ as pharmacy gave them quanity for 3days. Reviewed with Cassie, PA who advised she will refill Pt Rx. Discussed with Spouse Rx will be sent to Pharmacy.

## 2019-03-01 NOTE — Telephone Encounter (Signed)
Correction, per Cassie, PA -Pt will not have new rx for K+ sent to pharmacy as this was sent as a 1 time Rx. Notified Debra Kidd, he thanked me for the call. NO further concerns.

## 2019-03-02 ENCOUNTER — Inpatient Hospital Stay: Payer: Medicare Other

## 2019-03-02 ENCOUNTER — Encounter: Payer: Self-pay | Admitting: Vascular Surgery

## 2019-03-02 ENCOUNTER — Inpatient Hospital Stay: Payer: Medicare Other | Admitting: Internal Medicine

## 2019-03-02 ENCOUNTER — Ambulatory Visit (INDEPENDENT_AMBULATORY_CARE_PROVIDER_SITE_OTHER): Payer: Medicare Other | Admitting: Vascular Surgery

## 2019-03-02 ENCOUNTER — Telehealth: Payer: Self-pay | Admitting: Internal Medicine

## 2019-03-02 ENCOUNTER — Other Ambulatory Visit: Payer: Self-pay

## 2019-03-02 DIAGNOSIS — I6522 Occlusion and stenosis of left carotid artery: Secondary | ICD-10-CM

## 2019-03-02 DIAGNOSIS — I6529 Occlusion and stenosis of unspecified carotid artery: Secondary | ICD-10-CM | POA: Insufficient documentation

## 2019-03-02 NOTE — Telephone Encounter (Signed)
Scheduled appt per 6/26 sch message - pt aware of appt date and time

## 2019-03-02 NOTE — Progress Notes (Signed)
Patient name: Debra Kidd MRN: [id number] DOB: 1961-08-06 Sex: female  REASON FOR CONSULT: Evaluate proximal left common carotid artery stenosis  HPI: Debra Kidd is a 58 y.o. female, with history of hypertension, hyperlipidemia, tobacco abuse, stage IV small cell lung cancer that presents for evaluation of asymptomatic proximal left common carotid artery stenosis.  Patient was recently admitted on 5/25 to [facility] with left-sided weakness.  Ultimately she was evaluated by neurology and found to be COVID positive.  MRI subsequently revealed subacute ischemia of the right PCA territory and occlusion of the right PCA near its origin.  Neurology notes indicate they thought this was likely embolic event - exact etiology unclear - complicated by her covid infection.  As part of her work-up she had an incidental discovery of a proximal left common carotid artery stenosis on MRA and presents for evaluation of this lesion. She denies any left vision symptoms.  No right-sided weakness numbness tingling.  There was no evidence of significant bifurcation disease on her MRA neck from 01/29/2019. She sitting in a wheelchair today.  Ongoing deficits in left arm and leg, but improving.  States she is on immunotherapy for her stage IV lung cancer.  States she wants to put all her focus on fighting her lung cancer and knows that her time is limited.  Past Medical History:  Diagnosis Date   Basal cell carcinoma of cheek    L side of face   Blood type, Rh positive    Cancer (HCC)    Chronic pain syndrome    Depression 08/07/2016   Encounter for antineoplastic chemotherapy 07/17/2016   History of external beam radiation therapy 11/19/16-12/02/16   brain 25 Gy in 10 fractions   Hyperlipidemia    Hypertension    Hypertension 08/07/2016   Osteoporosis    Small cell lung cancer ([place])     Past Surgical History:  Procedure Laterality Date   ABDOMINAL HYSTERECTOMY  1981   APPENDECTOMY     at  age 43   EYE SURGERY     left   IR GENERIC HISTORICAL  06/03/2016   IR FLUORO GUIDE PORT INSERTION RIGHT 06/03/2016 [facility]-INTERV RAD   IR GENERIC HISTORICAL  06/03/2016   IR US GUIDE VASC ACCESS RIGHT 06/03/2016 [facility]INTER[facility] RAD   SKIN CANCER EXCISION     basal cell carcinoma L side of face    Family History  Problem Relation Age of Onset   Heart disease Mother    Heart disease Father    Emphysema Brother    Breast cancer Maternal Grandmother    Heart disease Brother     SOCIAL HISTORY: Social History   Socioeconomic History   Marital status: Married    Spouse name: Not on file   Number of children: 2   Years of education: Not on file   Highest education level: Not on file  Occupational History   Not on file  Social Needs   Financial resource strain: Not on file   Food insecurity    Worry: Not on file    Inability: Not on file   Transportation needs    Medical: Not on file    Non-medical: Not on file  Tobacco Use   Smoking status: Current Every Day Smoker    Packs/day: 1.00    Years: 43.00    Pack years: 43.00   Smokeless tobacco: Never Used  Substance and Sexual Activity   Alcohol use: Not Currently    Comment: quit  drinking beer 02/2016   Drug use: Yes    Frequency: 7.0 times per week    Types: Marijuana   Sexual activity: Not on file  Lifestyle   Physical activity    Days per week: Not on file    Minutes per session: Not on file   Stress: Not on file  Relationships   Social connections    Talks on phone: Not on file    Gets together: Not on file    Attends religious service: Not on file    Active member of club or organization: Not on file    Attends meetings of clubs or organizations: Not on file    Relationship status: Not on file   Intimate partner violence    Fear of current or ex partner: Not on file    Emotionally abused: Not on file    Physically abused: Not on file    Forced sexual activity: Not on file  Other Topics  Concern   Not on file  Social History Narrative   Married, lives with spouse   2 children   OCCUPATION: retired - Loss adjuster, 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  Currently on disability    Allergies  Allergen Reactions   Codeine Nausea And Vomiting   Motrin [Ibuprofen]     Elevation of BP   [name]     intolerant   Vicodin [Hydrocodone-Acetaminophen] Nausea And Vomiting    Current Outpatient Medications  Medication Sig Dispense Refill   aspirin EC 81 MG tablet Take 1 tablet (81 mg total) by mouth daily. 150 tablet 2   atorvastatin (LIPITOR) 80 MG tablet Take 1 tablet (80 mg total) by mouth daily at 6 PM. 30 tablet 0   celecoxib (CELEBREX) 200 MG capsule Take 200 mg by mouth daily as needed for mild pain.      clopidogrel (PLAVIX) 75 MG tablet Take 1 tablet (75 mg total) by mouth daily. 30 tablet 0   diazepam (VALIUM) 5 MG tablet Take 5 mg by mouth 3 (three) times daily as needed for anxiety.   2   diphenhydrAMINE (BENADRYL) 25 MG tablet Take 25 mg by mouth every 6 (six) hours as needed for itching or allergies.      lidocaine-prilocaine (EMLA) cream Apply 1 application topically as needed. 30 g 0   loperamide (IMODIUM A-D) 2 MG tablet Take 2 mg by mouth 4 (four) times daily as needed for diarrhea or loose stools.     magnesium oxide (MAG-OX) 400 (241.3 Mg) MG tablet Take 1 tablet (400 mg total) by mouth daily. 30 tablet 0   mirtazapine (REMERON) 15 MG tablet Take 1 tablet (15 mg total) by mouth at bedtime. 30 tablet 1   oxyCODONE-acetaminophen (PERCOCET) 5-325 MG tablet Take 1 tablet by mouth every 6 (six) hours as needed for severe pain. 30 tablet 0   polyethylene glycol (MIRALAX / GLYCOLAX) packet Take 17 g by mouth daily. 14 each 0   potassium chloride 20 MEQ/15ML (10%) SOLN Take 30 mLs (40 mEq total) by mouth daily. 90 mL 0   prochlorperazine (COMPAZINE) 10 MG tablet Take 1 tablet (10 mg total) by mouth every 6 (six) hours as needed for nausea or  vomiting. 30 tablet 0   traMADol (ULTRAM) 50 MG tablet Take 50 mg by mouth every 6 (six) hours as needed for moderate pain.      Vitamin D, Ergocalciferol, (DRISDOL) 1.25 MG (50000 UT) CAPS capsule Take 1 capsule (50,000 Units total) by mouth  every 7 (seven) days. 5 capsule 0   senna-docusate (SENOKOT-S) 8.6-50 MG tablet Take 2 tablets by mouth at bedtime. (Patient not taking: Reported on 03/02/2019) 5 tablet 0   No current facility-administered medications for this visit.     REVIEW OF SYSTEMS:  [X]  denotes positive finding, [ ]  denotes negative finding Cardiac  Comments:  Chest pain or chest pressure:    Shortness of breath upon exertion: x   Short of breath when lying flat:    Irregular heart rhythm:        Vascular    Pain in calf, thigh, or hip brought on by ambulation:    Pain in feet at night that wakes you up from your sleep:     Blood clot in your veins:    Leg swelling:         Pulmonary    Oxygen at home:    Productive cough:     Wheezing:         Neurologic    Sudden weakness in arms or legs:     Sudden numbness in arms or legs:     Sudden onset of difficulty speaking or slurred speech:    Temporary loss of vision in one eye:     Problems with dizziness:         Gastrointestinal    Blood in stool:     Vomited blood:         Genitourinary    Burning when urinating:     Blood in urine:        Psychiatric    Major depression:         Hematologic    Bleeding problems:    Problems with blood clotting too easily:        Skin    Rashes or ulcers:        Constitutional    Fever or chills:      PHYSICAL EXAM: Vitals:   03/02/19 0940 03/02/19 0944  BP: 111/85 128/88  Pulse: 86 86  Resp: 14   Temp: 98.5 F (36.9 C)   TempSrc: Temporal   SpO2: 97%   Weight: 128 lb (58.1 kg)   Height: 5\' 3"  (1.6 m)     GENERAL: The patient is a well-nourished female, in no acute distress. The vital signs are documented above. CARDIAC: There is a regular rate and  rhythm.  VASCULAR:  Carotid pulse palpable bilaterally 2+ bilateral radial pulses 1+ bilateral DP pulses palpable PULMONARY: There is good air exchange bilaterally without wheezing or rales. ABDOMEN: Soft and non-tender with normal pitched bowel sounds.  MUSCULOSKELETAL: There are no major deformities or cyanosis. NEUROLOGIC: Left arm and leg 4/5 strength. SKIN: There are no ulcers or rashes noted. PSYCHIATRIC: The patient has a normal affect.  DATA:   MRA neck from 01/29/2019 shows suspected high-grade stenosis of the proximal left common carotid artery.  The right and left ICA bifurcation have no significant disease.  Assessment/Plan:  58 year old female that was recently hospitalized with right PCA infarct that was suspected embolic by neurology (although exact etiology unclearn) and potentially related to her covid infection.  During subsequent stroke work-up she was found to have asymptomatic proximal left common carotid artery high-grade lesion and not related to her stroke.  Discussed with patient in detail that the treatment of proximal common carotid disease is less clear than bifurcation disease, especially if asymptomatic.  If her left common carotid stenosis were symptomatic, I would certainly be much more  aggressive at treating this likely with a covered stent and would need CTA neck to further evaluate.  She is fairly adamant that she does not want any other surgery.  She states that she knows her time is limited with her stage IV lung cancer and she wants to focus on her immunotherapy.  Given that this is an asymptomatic lesion I think that this is reasonable.  Continue aspirin Plavix as she is already taking DAPT.  I did offer follow-up in our office but she states she will call let us know she has any problems.   Marty Heck, MD [facility] Office: [phone] Pager: [place]

## 2019-03-08 ENCOUNTER — Telehealth: Payer: Self-pay | Admitting: *Deleted

## 2019-03-08 NOTE — Telephone Encounter (Signed)
Per last office visit patient needs to be seen by Neurologist post CVA.  Dr. Mickeal Skinner agreed to see patient.  Upon calling patient to schedule visit to see Dr Mickeal Skinner on the same day she is here to see Dr. Julien Nordmann, patient refused to schedule appt.  Stated that she didn't need one, "she is doing better, almost back to normal".  She states she can't afford to have another visit with another specialist.    Advised I would relay information to Dr Julien Nordmann and Vito Backers Heilingoetter, PA.  Dr. Mickeal Skinner is willing to see patient to help her manage her recent stroke if she should decide to be seen.

## 2019-03-16 ENCOUNTER — Other Ambulatory Visit: Payer: Self-pay

## 2019-03-16 ENCOUNTER — Inpatient Hospital Stay: Payer: Medicare Other

## 2019-03-16 ENCOUNTER — Ambulatory Visit (HOSPITAL_COMMUNITY)
Admission: RE | Admit: 2019-03-16 | Discharge: 2019-03-16 | Disposition: A | Discharge status: Home / Self Care | Payer: Medicare Other | Source: Ambulatory Visit | Attending: Physician Assistant | Admitting: Physician Assistant

## 2019-03-16 ENCOUNTER — Inpatient Hospital Stay: Payer: Medicare Other | Attending: Internal Medicine

## 2019-03-16 DIAGNOSIS — C778 Secondary and unspecified malignant neoplasm of lymph nodes of multiple regions: Secondary | ICD-10-CM | POA: Diagnosis not present

## 2019-03-16 DIAGNOSIS — R5383 Other fatigue: Secondary | ICD-10-CM

## 2019-03-16 DIAGNOSIS — I10 Essential (primary) hypertension: Secondary | ICD-10-CM | POA: Diagnosis not present

## 2019-03-16 DIAGNOSIS — Z5112 Encounter for antineoplastic immunotherapy: Secondary | ICD-10-CM | POA: Diagnosis not present

## 2019-03-16 DIAGNOSIS — Z7982 Long term (current) use of aspirin: Secondary | ICD-10-CM | POA: Insufficient documentation

## 2019-03-16 DIAGNOSIS — E785 Hyperlipidemia, unspecified: Secondary | ICD-10-CM | POA: Diagnosis not present

## 2019-03-16 DIAGNOSIS — K5900 Constipation, unspecified: Secondary | ICD-10-CM | POA: Diagnosis not present

## 2019-03-16 DIAGNOSIS — C3491 Malignant neoplasm of unspecified part of right bronchus or lung: Secondary | ICD-10-CM | POA: Insufficient documentation

## 2019-03-16 DIAGNOSIS — Z85828 Personal history of other malignant neoplasm of skin: Secondary | ICD-10-CM | POA: Insufficient documentation

## 2019-03-16 DIAGNOSIS — R110 Nausea: Secondary | ICD-10-CM | POA: Diagnosis not present

## 2019-03-16 DIAGNOSIS — C7889 Secondary malignant neoplasm of other digestive organs: Secondary | ICD-10-CM | POA: Insufficient documentation

## 2019-03-16 DIAGNOSIS — F329 Major depressive disorder, single episode, unspecified: Secondary | ICD-10-CM | POA: Diagnosis not present

## 2019-03-16 DIAGNOSIS — C3411 Malignant neoplasm of upper lobe, right bronchus or lung: Secondary | ICD-10-CM | POA: Insufficient documentation

## 2019-03-16 DIAGNOSIS — Z79899 Other long term (current) drug therapy: Secondary | ICD-10-CM | POA: Diagnosis not present

## 2019-03-16 LAB — CBC WITH DIFFERENTIAL (CANCER CENTER ONLY)
Abs Immature Granulocytes: 0.04 10*3/uL (ref 0.00–0.07)
Basophils Absolute: 0 10*3/uL (ref 0.0–0.1)
Basophils Relative: 0 %
Eosinophils Absolute: 0.2 10*3/uL (ref 0.0–0.5)
Eosinophils Relative: 2 %
HCT: 35.4 % — ABNORMAL LOW (ref 36.0–46.0)
Hemoglobin: 12.3 g/dL (ref 12.0–15.0)
Immature Granulocytes: 0 %
Lymphocytes Relative: 17 %
Lymphs Abs: 1.6 10*3/uL (ref 0.7–4.0)
MCH: 39.2 pg — ABNORMAL HIGH (ref 26.0–34.0)
MCHC: 34.7 g/dL (ref 30.0–36.0)
MCV: 112.7 fL — ABNORMAL HIGH (ref 80.0–100.0)
Monocytes Absolute: 0.4 10*3/uL (ref 0.1–1.0)
Monocytes Relative: 4 %
Neutro Abs: 7.2 10*3/uL (ref 1.7–7.7)
Neutrophils Relative %: 77 %
Platelet Count: 293 10*3/uL (ref 150–400)
RBC: 3.14 MIL/uL — ABNORMAL LOW (ref 3.87–5.11)
RDW: 12.9 % (ref 11.5–15.5)
WBC Count: 9.4 10*3/uL (ref 4.0–10.5)
nRBC: 0 % (ref 0.0–0.2)

## 2019-03-16 LAB — CMP (CANCER CENTER ONLY)
ALT: 17 U/L (ref 0–44)
AST: 17 U/L (ref 15–41)
Albumin: 3.4 g/dL — ABNORMAL LOW (ref 3.5–5.0)
Alkaline Phosphatase: 168 U/L — ABNORMAL HIGH (ref 38–126)
Anion gap: 12 (ref 5–15)
BUN: 4 mg/dL — ABNORMAL LOW (ref 6–20)
CO2: 26 mmol/L (ref 22–32)
Calcium: 8.5 mg/dL — ABNORMAL LOW (ref 8.9–10.3)
Chloride: 102 mmol/L (ref 98–111)
Creatinine: 0.76 mg/dL (ref 0.44–1.00)
GFR, Est AFR Am: >60 mL/min (ref >60)
GFR, Estimated: >60 mL/min (ref >60)
Glucose, Bld: 107 mg/dL — ABNORMAL HIGH (ref 70–99)
Potassium: 3.2 mmol/L — ABNORMAL LOW (ref 3.5–5.1)
Sodium: 140 mmol/L (ref 135–145)
Total Bilirubin: 0.6 mg/dL (ref 0.3–1.2)
Total Protein: 7.1 g/dL (ref 6.5–8.1)

## 2019-03-16 LAB — TSH: TSH: 2.747 u[IU]/mL (ref 0.308–3.960)

## 2019-03-16 MED ORDER — SODIUM CHLORIDE 0.9% FLUSH
10.0000 mL | INTRAVENOUS | Status: DC | PRN
  Administered 2019-03-16: 10 mL
  Filled 2019-03-16: qty 10

## 2019-03-16 MED ORDER — IOHEXOL 300 MG/ML  SOLN
100.0000 mL | Freq: Once | INTRAMUSCULAR | Status: AC | PRN
  Administered 2019-03-16: 16:00:00 100 mL via INTRAVENOUS

## 2019-03-16 MED ORDER — HEPARIN SOD (PORK) LOCK FLUSH 100 UNIT/ML IV SOLN
500.0000 [IU] | Freq: Once | INTRAVENOUS | Status: AC
  Administered 2019-03-16: 16:00:00 500 [IU] via INTRAVENOUS

## 2019-03-16 MED ORDER — IOHEXOL 300 MG/ML  SOLN
30.0000 mL | Freq: Once | INTRAMUSCULAR | Status: AC
  Administered 2019-03-16: 14:00:00 30 mL via ORAL

## 2019-03-16 MED ORDER — SODIUM CHLORIDE (PF) 0.9 % IJ SOLN
INTRAMUSCULAR | Status: AC
  Filled 2019-03-16: qty 50

## 2019-03-16 MED ORDER — HEPARIN SOD (PORK) LOCK FLUSH 100 UNIT/ML IV SOLN
INTRAVENOUS | Status: AC
  Filled 2019-03-16: qty 5

## 2019-03-17 ENCOUNTER — Telehealth: Payer: Self-pay | Admitting: *Deleted

## 2019-03-17 NOTE — Telephone Encounter (Signed)
TCT patient regarding lab work from yesterday. Advised patient that her potassium was low @ 3.2. Avised patient to increase her potassium in her diet. Reviewed foods that are high in K+. Pt voiced understanding. She is aware of her upcoming appts.

## 2019-03-17 NOTE — Telephone Encounter (Signed)
-----   Message from [name], MD sent at 03/16/2019  4:09 PM EDT ----- Please encourage her to increase her potassium rich diet. ----- Message ----- From: Interface, Lab In [place] Sent: 03/16/2019   1:08 PM EDT To: [name], MD

## 2019-03-18 ENCOUNTER — Encounter: Payer: Self-pay | Admitting: Physician Assistant

## 2019-03-18 ENCOUNTER — Inpatient Hospital Stay (HOSPITAL_BASED_OUTPATIENT_CLINIC_OR_DEPARTMENT_OTHER): Payer: Medicare Other | Admitting: Physician Assistant

## 2019-03-18 ENCOUNTER — Inpatient Hospital Stay: Payer: Medicare Other

## 2019-03-18 ENCOUNTER — Other Ambulatory Visit: Payer: Self-pay

## 2019-03-18 VITALS — BP 111/84 | HR 98 | Temp 98.3°F | Resp 18 | Ht 63.0 in | Wt 125.3 lb

## 2019-03-18 DIAGNOSIS — C3411 Malignant neoplasm of upper lobe, right bronchus or lung: Secondary | ICD-10-CM | POA: Diagnosis not present

## 2019-03-18 DIAGNOSIS — F329 Major depressive disorder, single episode, unspecified: Secondary | ICD-10-CM

## 2019-03-18 DIAGNOSIS — C3491 Malignant neoplasm of unspecified part of right bronchus or lung: Secondary | ICD-10-CM

## 2019-03-18 DIAGNOSIS — K5900 Constipation, unspecified: Secondary | ICD-10-CM

## 2019-03-18 DIAGNOSIS — I10 Essential (primary) hypertension: Secondary | ICD-10-CM

## 2019-03-18 DIAGNOSIS — Z5112 Encounter for antineoplastic immunotherapy: Secondary | ICD-10-CM | POA: Diagnosis not present

## 2019-03-18 DIAGNOSIS — Z85828 Personal history of other malignant neoplasm of skin: Secondary | ICD-10-CM

## 2019-03-18 DIAGNOSIS — C7889 Secondary malignant neoplasm of other digestive organs: Secondary | ICD-10-CM | POA: Diagnosis not present

## 2019-03-18 DIAGNOSIS — C778 Secondary and unspecified malignant neoplasm of lymph nodes of multiple regions: Secondary | ICD-10-CM | POA: Diagnosis not present

## 2019-03-18 DIAGNOSIS — E785 Hyperlipidemia, unspecified: Secondary | ICD-10-CM

## 2019-03-18 DIAGNOSIS — R110 Nausea: Secondary | ICD-10-CM

## 2019-03-18 DIAGNOSIS — Z79899 Other long term (current) drug therapy: Secondary | ICD-10-CM

## 2019-03-18 DIAGNOSIS — Z7982 Long term (current) use of aspirin: Secondary | ICD-10-CM

## 2019-03-18 MED ORDER — HEPARIN SOD (PORK) LOCK FLUSH 100 UNIT/ML IV SOLN
500.0000 [IU] | Freq: Once | INTRAVENOUS | Status: AC | PRN
  Administered 2019-03-18: 500 [IU]
  Filled 2019-03-18: qty 5

## 2019-03-18 MED ORDER — SODIUM CHLORIDE 0.9 % IV SOLN
1200.0000 mg | Freq: Once | INTRAVENOUS | Status: AC
  Administered 2019-03-18: 1200 mg via INTRAVENOUS
  Filled 2019-03-18: qty 20

## 2019-03-18 MED ORDER — SODIUM CHLORIDE 0.9 % IV SOLN
Freq: Once | INTRAVENOUS | Status: AC
  Administered 2019-03-18: 10:00:00 via INTRAVENOUS
  Filled 2019-03-18: qty 250

## 2019-03-18 MED ORDER — SODIUM CHLORIDE 0.9% FLUSH
10.0000 mL | INTRAVENOUS | Status: DC | PRN
  Administered 2019-03-18: 11:00:00 10 mL
  Filled 2019-03-18: qty 10

## 2019-03-18 NOTE — Patient Instructions (Addendum)
[facility] Discharge Instructions for Patients Receiving Chemotherapy  Today you received the following chemotherapy agents: Atezolizumab Gildardo Pounds)  To help prevent nausea and vomiting after your treatment, we encourage you to take your nausea medication as directed.    If you develop nausea and vomiting that is not controlled by your nausea medication, call the clinic.   BELOW ARE SYMPTOMS THAT SHOULD BE REPORTED IMMEDIATELY:  *FEVER GREATER THAN 100.5 F  *CHILLS WITH OR WITHOUT FEVER  NAUSEA AND VOMITING THAT IS NOT CONTROLLED WITH YOUR NAUSEA MEDICATION  *UNUSUAL SHORTNESS OF BREATH  *UNUSUAL BRUISING OR BLEEDING  TENDERNESS IN MOUTH AND THROAT WITH OR WITHOUT PRESENCE OF ULCERS  *URINARY PROBLEMS  *BOWEL PROBLEMS  UNUSUAL RASH Items with * indicate a potential emergency and should be followed up as soon as possible.  Feel free to call the clinic should you have any questions or concerns. The clinic phone number is [phone].  Please show the [facility] at check-in to the Emergency Department and triage nurse.   Hypokalemia Hypokalemia means that the amount of potassium in the blood is lower than normal. Potassium is a chemical (electrolyte) that helps regulate the amount of fluid in the body. It also stimulates muscle tightening (contraction) and helps nerves work properly. Normally, most of the body's potassium is inside cells, and only a very small amount is in the blood. Because the amount in the blood is so small, minor changes to potassium levels in the blood can be life-threatening. What are the causes? This condition may be caused by: Antibiotic medicine. Diarrhea or vomiting. Taking too much of a medicine that helps you have a bowel movement (laxative) can cause diarrhea and lead to hypokalemia. Chronic kidney disease (CKD). Medicines that help the body get rid of excess fluid (diuretics). Eating disorders, such as bulimia. Low  magnesium levels in the body. Sweating a lot. What are the signs or symptoms? Symptoms of this condition include: Weakness. Constipation. Fatigue. Muscle cramps. Mental confusion. Skipped heartbeats or irregular heartbeat (palpitations). Tingling or numbness. How is this diagnosed? This condition is diagnosed with a blood test. How is this treated? This condition may be treated by: Taking potassium supplements by mouth. Adjusting the medicines that you take. Eating more foods that contain a lot of potassium. If your potassium level is very low, you may need to get potassium through an IV and be monitored in the hospital. Follow these instructions at home:  Take over-the-counter and prescription medicines only as told by your health care provider. This includes vitamins and supplements. Eat a healthy diet. A healthy diet includes fresh fruits and vegetables, whole grains, healthy fats, and lean proteins. If instructed, eat more foods that contain a lot of potassium. This includes: Nuts, such as peanuts and pistachios. Seeds, such as sunflower seeds and pumpkin seeds. Peas, lentils, and lima beans. Whole grain and bran cereals and breads. Fresh fruits and vegetables, such as apricots, avocado, bananas, cantaloupe, kiwi, oranges, tomatoes, asparagus, and potatoes. Orange juice. Tomato juice. Red meats. Yogurt. Keep all follow-up visits as told by your health care provider. This is important. Contact a health care provider if you: Have weakness that gets worse. Feel your heart pounding or racing. Vomit. Have diarrhea. Have diabetes (diabetes mellitus) and you have trouble keeping your blood sugar (glucose) in your target range. Get help right away if you: Have chest pain. Have shortness of breath. Have vomiting or diarrhea that lasts for more than 2 days. Faint. Summary  Hypokalemia means that the amount of potassium in the blood is lower than normal. This condition is  diagnosed with a blood test. Hypokalemia may be treated by taking potassium supplements, adjusting the medicines that you take, or eating more foods that are high in potassium. If your potassium level is very low, you may need to get potassium through an IV and be monitored in the hospital. This information is not intended to replace advice given to you by your health care provider. Make sure you discuss any questions you have with your health care provider. Document Released: 08/19/2005 Document Revised: 04/01/2018 Document Reviewed: 04/01/2018 Elsevier Patient Education  2020 [employer].

## 2019-03-18 NOTE — Patient Instructions (Signed)
Potassium Content of Foods  Potassium is a mineral found in many foods and drinks. It affects how the heart works, and helps keep fluids and minerals balanced in the body. The amount of potassium you need each day depends on your age and any medical conditions you may have. Talk to your health care provider or dietitian about how much potassium you need. The following lists of foods provide the general serving size for foods and the approximate amount of potassium in each serving, listed in milligrams (mg). Actual values may vary depending on the product and how it is processed. High in potassium The following foods and beverages have 200 mg or more of potassium per serving:  Apricots (raw) - 2 have 200 mg of potassium.  Apricots (dry) - 5 have 200 mg of potassium.  Artichoke - 1 medium has 345 mg of potassium.  Avocado -  fruit has 245 mg of potassium.  Banana - 1 medium fruit has 425 mg of potassium.  [place] or baked beans (canned) -  cup has 280 mg of potassium.  White beans (canned) -  cup has 595 mg potassium.  Beef roast - 3 oz has 320 mg of potassium.  Ground beef - 3 oz has 270 mg of potassium.  Beets (raw or cooked) -  cup has 260 mg of potassium.  Bran muffin - 2 oz has 300 mg of potassium.  Broccoli (cooked) -  cup has 230 mg of potassium.  Brussels sprouts -  cup has 250 mg of potassium.  Cantaloupe -  cup has 215 mg of potassium.  Cereal, 100% bran -  cup has 200-400 mg of potassium.  Cheeseburger -1 single fast food burger has 225-400 mg of potassium.  Chicken - 3 oz has 220 mg of potassium.  Clams (canned) - 3 oz has 535 mg of potassium.  Crab - 3 oz has 225 mg of potassium.  Dates - 5 have 270 mg of potassium.  Dried beans and peas -  cup has 300-475 mg of potassium.  Figs (dried) - 2 have 260 mg of potassium.  Fish (halibut, tuna, cod, snapper) - 3 oz has 480 mg of potassium.  Fish (salmon, haddock, swordfish, perch) - 3 oz has 300 mg of  potassium.  Fish (tuna, canned) - 3 oz has 200 mg of potassium.  [place] fries (fast food) - 3 oz has 470 mg of potassium.  Granola with fruit and nuts -  cup has 200 mg of potassium.  Grapefruit juice -  cup has 200 mg of potassium.  Honeydew melon -  cup has 200 mg of potassium.  Kale (raw) - 1 cup has 300 mg of potassium.  Kiwi - 1 medium fruit has 240 mg of potassium.  Kohlrabi, rutabaga, parsnips -  cup has 280 mg of potassium.  Lentils -  cup has 365 mg of potassium.  Mango - 1 each has 325 mg of potassium.  Milk (nonfat, low-fat, whole, buttermilk) - 1 cup has 350-380 mg of potassium.  Milk (chocolate) - 1 cup has 420 mg of potassium  Molasses - 1 Tbsp has 295 mg of potassium.  Mushrooms -  cup has 280 mg of potassium.  Nectarine - 1 each has 275 mg of potassium.  Nuts (almonds, peanuts, hazelnuts, [place], cashew, mixed) - 1 oz has 200 mg of potassium.  Nuts (pistachios) - 1 oz has 295 mg of potassium.  Orange - 1 fruit has 240 mg of potassium.  Orange juice -  cup has 235 mg of potassium.  Papaya -  medium fruit has 390 mg of potassium.  Peanut butter (chunky) - 2 Tbsp has 240 mg of potassium.  Peanut butter (smooth) - 2 Tbsp has 210 mg of potassium.  Pear - 1 medium (200 mg) of potassium.  Pomegranate - 1 whole fruit has 400 mg of potassium.  Pomegranate juice -  cup has 215 mg of potassium.  Pork - 3 oz has 350 mg of potassium.  Potato chips (salted) - 1 oz has 465 mg of potassium.  Potato (baked with skin) - 1 medium has 925 mg of potassium.  Potato (boiled) -  cup has 255 mg of potassium.  Potato (Mashed) -  cup has 330 mg of potassium.  Prune juice -  cup has 370 mg of potassium.  Prunes - 5 have 305 mg of potassium.  Pudding (chocolate) -  cup has 230 mg of potassium.  Pumpkin (canned) -  cup has 250 mg of potassium.  Raisins (seedless) -  cup has 270 mg of potassium.  Seeds (sunflower or pumpkin) - 1 oz has 240 mg of  potassium.  Soy milk - 1 cup has 300 mg of potassium.  Spinach (cooked) - 1/2 cup has 420 mg of potassium.  Spinach (canned) -  cup has 370 mg of potassium.  Sweet potato (baked with skin) - 1 medium has 450 mg of potassium.  Swiss chard -  cup has 480 mg of potassium.  Tomato or vegetable juice -  cup has 275 mg of potassium.  Tomato (sauce or puree) -  cup has 400-550 mg of potassium.  Tomato (raw) - 1 medium has 290 mg of potassium.  Tomato (canned) -  cup has 200-300 mg of potassium.  [place] - 3 oz has 250 mg of potassium.  Wheat germ - 1 oz has 250 mg of potassium.  Winter squash -  cup has 250 mg of potassium.  Yogurt (plain or fruited) - 6 oz has 260-435 mg of potassium.  Zucchini -  cup has 220 mg of potassium. Moderate in potassium The following foods and beverages have 50-200 mg of potassium per serving:  Apple - 1 fruit has 150 mg of potassium  Apple juice -  cup has 150 mg of potassium  Applesauce -  cup has 90 mg of potassium  Apricot nectar -  cup has 140 mg of potassium  Asparagus (small spears) -  cup has 155 mg of potassium  Asparagus (large spears) - 6 have 155 mg of potassium  Bagel (cinnamon raisin) - 1 four-inch bagel has 130 mg of potassium  Bagel (egg or plain) - 1 four- inch bagel has 70 mg of potassium  Beans (green) -  cup has 90 mg of potassium  Beans (yellow) -  cup has 190 mg of potassium  Beer, regular - 12 oz has 100 mg of potassium  Beets (canned) -  cup has 125 mg of potassium  Blackberries -  cup has 115 mg of potassium  Blueberries -  cup has 60 mg of potassium  Bread (whole wheat) - 1 slice has 70 mg of potassium  Broccoli (raw) -  cup has 145 mg of potassium  Cabbage -  cup has 150 mg of potassium  Carrots (cooked or raw) -  cup has 180 mg of potassium  Cauliflower (raw) -  cup has 150 mg of potassium  Celery (raw) -  cup has 155 mg of potassium  Cereal, bran  flakes -  cup has 120-150 mg  of potassium  Cheese (cottage) -  cup has 110 mg of potassium  Cherries - 10 have 150 mg of potassium  Chocolate - 1 oz bar has 165 mg of potassium  Coffee (brewed) - 6 oz has 90 mg of potassium  Corn -  cup or 1 ear has 195 mg of potassium  Cucumbers -  cup has 80 mg of potassium  Egg - 1 large egg has 60 mg of potassium  Eggplant -  cup has 60 mg of potassium  Endive (raw) -  cup has 80 mg of potassium  English muffin - 1 has 65 mg of potassium  Fish (ocean perch) - 3 oz has 192 mg of potassium  Frankfurter, beef or pork - 1 has 75 mg of potassium  Fruit cocktail -  cup has 115 mg of potassium  Grape juice -  cup has 170 mg of potassium  Grapefruit -  fruit has 175 mg of potassium  Grapes -  cup has 155 mg of potassium  Greens: kale, turnip, collard -  cup has 110-150 mg of potassium  Ice cream or frozen yogurt (chocolate) -  cup has 175 mg of potassium  Ice cream or frozen yogurt (vanilla) -  cup has 120-150 mg of potassium  Lemons, limes - 1 each has 80 mg of potassium  Lettuce - 1 cup has 100 mg of potassium  Mixed vegetables -  cup has 150 mg of potassium  Mushrooms, raw -  cup has 110 mg of potassium  Nuts (walnuts, pecans, or macadamia) - 1 oz has 125 mg of potassium  Oatmeal -  cup has 80 mg of potassium  Okra -  cup has 110 mg of potassium  Onions -  cup has 120 mg of potassium  Peach - 1 has 185 mg of potassium  Peaches (canned) -  cup has 120 mg of potassium  Pears (canned) -  cup has 120 mg of potassium  Peas, green (frozen) -  cup has 90 mg of potassium  Peppers (Green) -  cup has 130 mg of potassium  Peppers (Red) -  cup has 160 mg of potassium  Pineapple juice -  cup has 165 mg of potassium  Pineapple (fresh or canned) -  cup has 100 mg of potassium  Plums - 1 has 105 mg of potassium  Pudding, vanilla -  cup has 150 mg of potassium  Raspberries -  cup has 90 mg of potassium  Rhubarb -  cup has  115 mg of potassium  Rice, wild -  cup has 80 mg of potassium  Shrimp - 3 oz has 155 mg of potassium  Spinach (raw) - 1 cup has 170 mg of potassium  Strawberries -  cup has 125 mg of potassium  Summer squash -  cup has 175-200 mg of potassium  Swiss chard (raw) - 1 cup has 135 mg of potassium  Tangerines - 1 fruit has 140 mg of potassium  Tea, brewed - 6 oz has 65 mg of potassium  Turnips -  cup has 140 mg of potassium  Watermelon -  cup has 85 mg of potassium  Wine (Red, table) - 5 oz has 180 mg of potassium  Wine (White, table) - 5 oz 100 mg of potassium Low in potassium The following foods and beverages have less than 50 mg of potassium per serving.  Bread (white) - 1 slice has 30 mg of potassium  Carbonated beverages - 12 oz has less than 5 mg of potassium  Cheese - 1 oz has 20-30 mg of potassium  Cranberries -  cup has 45 mg of potassium  Cranberry juice cocktail -  cup has 20 mg of potassium  Fats and oils - 1 Tbsp has less than 5 mg of potassium  Hummus - 1 Tbsp has 32 mg of potassium  Nectar (papaya, mango, or pear) -  cup has 35 mg of potassium  Rice (white or brown) -  cup has 50 mg of potassium  Spaghetti or macaroni (cooked) -  cup has 30 mg of potassium  [place], flour or corn - 1 has 50 mg of potassium  Waffle - 1 four-inch waffle has 50 mg of potassium  Water chestnuts -  cup has 40 mg of potassium Summary  Potassium is a mineral found in many foods and drinks. It affects how the heart works, and helps keep fluids and minerals balanced in the body.  The amount of potassium you need each day depends on your age and any existing medical conditions you may have. Your health care provider or dietitian may recommend an amount of potassium that you should have each day. This information is not intended to replace advice given to you by your health care provider. Make sure you discuss any questions you have with your health care provider.  Document Released: 04/02/2005 Document Revised: 08/01/2017 Document Reviewed: 11/13/2016 Elsevier Patient Education  [place].

## 2019-03-18 NOTE — Progress Notes (Signed)
[facility] OFFICE PROGRESS NOTE  [name], MD [address] [place] [address]  DIAGNOSIS: Extensive stage (T2a, N3, [name], large right anterior mediastinal and supraclavicular lymphadenopathy as well as pancreatic and splenic metastasis diagnosed in September 2017. The patient had disease progression in October 2019.   PRIOR THERAPY:  1) Systemic chemotherapy was carboplatin for AUC of 5 on day 1 and etoposide 100 MG/M2 on days 1, 2 and 3 with Neulasta support. Status post 6 cycles with significant response of her disease. 2) Prophylactic cranial irradiation under the care of Dr. Sondra Come on 12/02/2016. 3) stereotactic radiotherapy to the recurrent right upper lobe pulmonary nodule under the care of Dr. Sondra Come completed November 10, 2017. 4)Retreatment with systemic chemotherapy with carboplatin for AUC of 5 on day 1 and etoposide 100 mg/M2 on days 1, 2 and 3 as well as Tecentriq (Atezolizumab) 1200 mg IV every 3 weeks with Neulasta support. First dose June 22, 2018 for disease recurrence. Status post 5 cycles. Starting from cycle #2 her dose of carboplatin will be reduced to AUC of 4 and etoposide 80 mg/M2 on days 1, 2 and 3 in addition to the regular dose of Tecentriq.  CURRENT THERAPY: Maintenance treatment with single agent Tecentriq 1200 mg IV every 3 weeks. Status post7cycles.  INTERVAL HISTORY: Debra Kidd 58 y.o. female returns to the clinic for a follow-up visit.  The patient is feeling well today without any concerning complaints except for some nausea and constipation secondary to her pain medication use.  She uses percocet for her back pain. The patient states that she tries to take a Compazine before taking any pain medication but still continues to experiences some nausea.  For her constipation, she tries to take MiraLAX as needed for constipation.  Otherwise, she continues to tolerate her  treatment well without any concerning adverse side effects.  She denies any fever, chills, night sweats, or weight loss.  She denies any chest pain, shortness of breath, cough, or hemoptysis.  She denies any vomiting or diarrhea.  She denies any headaches or visual changes.  She occasionally experiences dry scattered red lesions on her body since being on immunotherapy which are mildly pruritic but states that they do not bother her. The patient recently had a restaging CT scan performed.  She is here today for evaluation and to review her scan results before starting cycle #12.  MEDICAL HISTORY: Past Medical History:  Diagnosis Date  . 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 Blood type, Rh positive   . Cancer ([facility])   . Chronic pain syndrome   . Depression 08/07/2016  . Encounter for antineoplastic chemotherapy 07/17/2016  . History of external beam radiation therapy 11/19/16-12/02/16   brain 25 Gy in 10 fractions  . Hyperlipidemia   . Hypertension   . Hypertension 08/07/2016  . Osteoporosis   . Small cell lung cancer ([facility])     ALLERGIES:  is allergic to codeine; motrin [ibuprofen]; thiazide-type diuretics; and vicodin [hydrocodone-acetaminophen].  MEDICATIONS:  Current Outpatient Medications  Medication Sig Dispense Refill  . aspirin EC 81 MG tablet Take 1 tablet (81 mg total) by mouth daily. 150 tablet 2  . atorvastatin (LIPITOR) 80 MG tablet Take 1 tablet (80 mg total) by mouth daily at 6 PM. 30 tablet 0  . celecoxib (CELEBREX) 200 MG capsule Take 200 mg by mouth daily as  needed for mild pain.     Marland Kitchen clopidogrel (PLAVIX) 75 MG tablet Take 1 tablet (75 mg total) by mouth daily. 30 tablet 0  . diazepam (VALIUM) 5 MG tablet Take 5 mg by mouth 3 (three) times daily as needed for anxiety.   2  . diphenhydrAMINE (BENADRYL) 25 MG tablet Take 25 mg by mouth every 6 (six) hours as needed for itching or allergies.     Marland Kitchen lidocaine-prilocaine (EMLA) cream Apply 1 application topically as  needed. 30 g 0  . loperamide (IMODIUM A-D) 2 MG tablet Take 2 mg by mouth 4 (four) times daily as needed for diarrhea or loose stools.    . magnesium oxide (MAG-OX) 400 (241.3 Mg) MG tablet Take 1 tablet (400 mg total) by mouth daily. 30 tablet 0  . mirtazapine (REMERON) 15 MG tablet Take 1 tablet (15 mg total) by mouth at bedtime. 30 tablet 1  . oxyCODONE-acetaminophen (PERCOCET) 5-325 MG tablet Take 1 tablet by mouth every 6 (six) hours as needed for severe pain. 30 tablet 0  . polyethylene glycol (MIRALAX / GLYCOLAX) packet Take 17 g by mouth daily. 14 each 0  . potassium chloride 20 MEQ/15ML (10%) SOLN Take 30 mLs (40 mEq total) by mouth daily. 90 mL 0  . prochlorperazine (COMPAZINE) 10 MG tablet Take 1 tablet (10 mg total) by mouth every 6 (six) hours as needed for nausea or vomiting. 30 tablet 0  . traMADol (ULTRAM) 50 MG tablet Take 50 mg by mouth every 6 (six) hours as needed for moderate pain.     . Vitamin D, Ergocalciferol, (DRISDOL) 1.25 MG (50000 UT) CAPS capsule Take 1 capsule (50,000 Units total) by mouth every 7 (seven) days. 5 capsule 0  . senna-docusate (SENOKOT-S) 8.6-50 MG tablet Take 2 tablets by mouth at bedtime. (Patient not taking: Reported on 03/02/2019) 5 tablet 0   No current facility-administered medications for this visit.    Facility-Administered Medications Ordered in Other Visits  Medication Dose Route Frequency Provider Last Rate Last Dose  . atezolizumab (TECENTRIQ) 1,200 mg in sodium chloride 0.9 % 250 mL chemo infusion  1,200 mg Intravenous Once [name], MD 540 mL/hr at 03/18/19 1016 1,200 mg at 03/18/19 1016  . heparin lock flush 100 unit/mL  500 Units Intracatheter Once PRN [name], MD      . sodium chloride flush (NS) 0.9 % injection 10 mL  10 mL Intracatheter PRN [name], MD        SURGICAL HISTORY:  Past Surgical History:  Procedure Laterality Date  . ABDOMINAL HYSTERECTOMY  1981  . APPENDECTOMY     at age 86  . EYE SURGERY      left  . IR GENERIC HISTORICAL  06/03/2016   IR FLUORO GUIDE PORT INSERTION RIGHT 06/03/2016 [facility]  . IR GENERIC HISTORICAL  06/03/2016   IR US GUIDE VASC ACCESS RIGHT 06/03/2016 [facility]  . SKIN CANCER EXCISION     basal cell carcinoma L side of face    REVIEW OF SYSTEMS:   Review of Systems  Constitutional: Negative for appetite change, chills, fatigue, [name], nosebleeds, sore throat and trouble swallowing.   Eyes: Negative for eye problems and icterus.  Respiratory: Negative for cough, hemoptysis, shortness of breath and wheezing.   Cardiovascular: Negative for chest pain and leg swelling.  Gastrointestinal: Positive for constipation and nausea secondary to her percocet. Negative for abdominal pain, diarrhea, and  vomiting.  Genitourinary: Negative for bladder incontinence, difficulty urinating, dysuria, frequency and hematuria.   Musculoskeletal: Negative for back pain, gait problem, neck pain and neck stiffness.  Skin: Positive for rare small, dry, red lesions. Positive for mild itching. Neurological: Negative for dizziness, extremity weakness, gait problem, headaches, [name], depression and sleep disturbance. The patient is not nervous/anxious.     PHYSICAL EXAMINATION:  Blood pressure 111/84, pulse 98, temperature 98.3 F (36.8 C), temperature source Oral, resp. rate 18, height 5\' 3"  (1.6 m), weight 125 lb 4.8 oz (56.8 kg), SpO2 99 %.  ECOG PERFORMANCE STATUS: 1 - Symptomatic but completely ambulatory  Physical Exam  Constitutional: Oriented to person, place, and time and well-developed, well-nourished, and in no distress.  HENT:  Head: Normocephalic and atraumatic.  Mouth/Throat: Oropharynx is clear and moist. No oropharyngeal exudate.  Eyes: Conjunctivae  are normal. Right eye exhibits no discharge. Left eye exhibits no discharge. No scleral icterus.  Neck: Normal range of motion. Neck supple.  Cardiovascular: Normal rate, regular rhythm, normal heart sounds and intact distal pulses.   Pulmonary/Chest: Effort normal and breath sounds normal. No respiratory distress. No wheezes. No rales.  Abdominal: Soft. Bowel sounds are normal. Exhibits no distension and no mass. There is no tenderness.  Musculoskeletal: Normal range of motion. Exhibits no edema.  Lymphadenopathy:    No cervical adenopathy.  Neurological: Alert and oriented to person, place, and time. Exhibits normal muscle tone. Gait normal. Coordination normal.  Skin: Positive for rare small, dry, red lesions. Skin is warm and dry. Not diaphoretic. No erythema. No pallor.  Psychiatric: Mood, memory and judgment normal.  Vitals reviewed.  LABORATORY DATA: Lab Results  Component Value Date   WBC 9.4 03/16/2019   HGB 12.3 03/16/2019   HCT 35.4 (L) 03/16/2019   MCV 112.7 (H) 03/16/2019   PLT 293 03/16/2019      Chemistry      Component Value Date/Time   NA 140 03/16/2019 1255   NA 141 08/01/2017 0835   K 3.2 (L) 03/16/2019 1255   K 3.5 08/01/2017 0835   CL 102 03/16/2019 1255   CO2 26 03/16/2019 1255   CO2 25 08/01/2017 0835   BUN 4 (L) 03/16/2019 1255   BUN 8.0 08/01/2017 0835   CREATININE 0.76 03/16/2019 1255   CREATININE 0.9 08/01/2017 0835      Component Value Date/Time   CALCIUM 8.5 (L) 03/16/2019 1255   CALCIUM 9.9 08/01/2017 0835   ALKPHOS 168 (H) 03/16/2019 1255   ALKPHOS 142 08/01/2017 0835   AST 17 03/16/2019 1255   AST 15 08/01/2017 0835   ALT 17 03/16/2019 1255   ALT 19 08/01/2017 0835   BILITOT 0.6 03/16/2019 1255   BILITOT 0.35 08/01/2017 0835       RADIOGRAPHIC STUDIES:  Ct Chest W Contrast  Result Date: 03/17/2019 CLINICAL DATA:  Restaging metastatic lung cancer EXAM: CT CHEST, ABDOMEN, AND PELVIS WITH CONTRAST TECHNIQUE: Multidetector CT  imaging of the chest, abdomen and pelvis was performed following the standard protocol during bolus administration of intravenous contrast. CONTRAST:  170mL OMNIPAQUE IOHEXOL 300 MG/ML SOLN, additional oral enteric contrast COMPARISON:  12/25/2018, 10/02/2018 FINDINGS: CT CHEST FINDINGS Cardiovascular: Right chest port catheter. Aortic atherosclerosis. Normal heart size. Left coronary artery calcifications. No pericardial effusion. Mediastinum/Nodes: No enlarged mediastinal, hilar, or axillary lymph nodes. Unchanged pretracheal and right hilar soft tissue thickening. Thyroid gland, trachea, and esophagus demonstrate no  significant findings. Lungs/Pleura: Slight interval increase in consolidation of the peripheral right pulmonary apex, measuring approximately 3.2 x 2.4 cm (series 4, image 31). A pulmonary nodule previously seen within this opacity is not clearly appreciated on current examination. No pleural effusion or pneumothorax. Musculoskeletal: No chest wall mass or suspicious bone lesions identified. No change in wedge deformities of T7 through T9. CT ABDOMEN PELVIS FINDINGS Hepatobiliary: No solid liver abnormality is seen. No gallstones, gallbladder wall thickening, or biliary dilatation. Pancreas: Unremarkable. No pancreatic ductal dilatation or surrounding inflammatory changes. Spleen: Normal in size without significant abnormality. Adrenals/Urinary Tract: Adrenal glands are unremarkable. Kidneys are normal, without renal calculi, solid lesion, or hydronephrosis. Bladder is unremarkable. Stomach/Bowel: Stomach is within normal limits. No evidence of bowel wall thickening, distention, or inflammatory changes. Soft tissue stranding of the small bowel mesentery unchanged from prior (series 2, image 83, series 5, image 40). Vascular/Lymphatic: Severe aortic atherosclerosis. No enlarged abdominal or pelvic lymph nodes. Reproductive: Status post hysterectomy. Other: No abdominal wall hernia or abnormality. No  abdominopelvic ascites. Musculoskeletal: No change in superior endplate deformity of L2. IMPRESSION: 1. Slight interval increase in consolidation of the peripheral right pulmonary apex, measuring approximately 3.2 x 2.4 cm (series 4, image 31). A pulmonary nodule previously seen within this opacity is not clearly appreciated on current examination. Findings are most consistent with continued evolution of post treatment/post radiation change. 2. Unchanged pretracheal and right hilar soft tissue thickening. 3. No change in wedge deformities of T7 through T9. No change in superior endplate deformity of L2. 4. Soft tissue stranding of the small bowel mesentery unchanged from prior ( "Misty mesentery" sign), likely related to nonspecific infectious or inflammatory enteritis. Attention on follow-up. 5. Other chronic, incidental, and postoperative findings as detailed above. Electronically Signed   By: Eddie Candle M.D.   On: 03/17/2019 10:34   Ct Abdomen Pelvis W Contrast  Result Date: 03/17/2019 CLINICAL DATA:  Restaging metastatic lung cancer EXAM: CT CHEST, ABDOMEN, AND PELVIS WITH CONTRAST TECHNIQUE: Multidetector CT imaging of the chest, abdomen and pelvis was performed following the standard protocol during bolus administration of intravenous contrast. CONTRAST:  196mL OMNIPAQUE IOHEXOL 300 MG/ML SOLN, additional oral enteric contrast COMPARISON:  12/25/2018, 10/02/2018 FINDINGS: CT CHEST FINDINGS Cardiovascular: Right chest port catheter. Aortic atherosclerosis. Normal heart size. Left coronary artery calcifications. No pericardial effusion. Mediastinum/Nodes: No enlarged mediastinal, hilar, or axillary lymph nodes. Unchanged pretracheal and right hilar soft tissue thickening. Thyroid gland, trachea, and esophagus demonstrate no significant findings. Lungs/Pleura: Slight interval increase in consolidation of the peripheral right pulmonary apex, measuring approximately 3.2 x 2.4 cm (series 4, image 31). A  pulmonary nodule previously seen within this opacity is not clearly appreciated on current examination. No pleural effusion or pneumothorax. Musculoskeletal: No chest wall mass or suspicious bone lesions identified. No change in wedge deformities of T7 through T9. CT ABDOMEN PELVIS FINDINGS Hepatobiliary: No solid liver abnormality is seen. No gallstones, gallbladder wall thickening, or biliary dilatation. Pancreas: Unremarkable. No pancreatic ductal dilatation or surrounding inflammatory changes. Spleen: Normal in size without significant abnormality. Adrenals/Urinary Tract: Adrenal glands are unremarkable. Kidneys are normal, without renal calculi, solid lesion, or hydronephrosis. Bladder is unremarkable. Stomach/Bowel: Stomach is within normal limits. No evidence of bowel wall thickening, distention, or inflammatory changes. Soft tissue stranding of the small bowel mesentery unchanged from prior (series 2, image 83, series 5, image 40). Vascular/Lymphatic: Severe aortic atherosclerosis. No enlarged abdominal or pelvic lymph nodes. Reproductive: Status post hysterectomy. Other: No abdominal wall hernia or  abnormality. No abdominopelvic ascites. Musculoskeletal: No change in superior endplate deformity of L2. IMPRESSION: 1. Slight interval increase in consolidation of the peripheral right pulmonary apex, measuring approximately 3.2 x 2.4 cm (series 4, image 31). A pulmonary nodule previously seen within this opacity is not clearly appreciated on current examination. Findings are most consistent with continued evolution of post treatment/post radiation change. 2. Unchanged pretracheal and right hilar soft tissue thickening. 3. No change in wedge deformities of T7 through T9. No change in superior endplate deformity of L2. 4. Soft tissue stranding of the small bowel mesentery unchanged from prior ( "Misty mesentery" sign), likely related to nonspecific infectious or inflammatory enteritis. Attention on follow-up. 5.  Other chronic, incidental, and postoperative findings as detailed above. Electronically Signed   By: Eddie Candle M.D.   On: 03/17/2019 10:34     ASSESSMENT/PLAN:  This is a very pleasant 58 year old Caucasian female with extensive stage small cell lung cancer.  She presented with a right upper lobe lung mass, large right anterior mediastinal and supraclavicular lymphadenopathy as well as a pancreatic and splenic metastasis.  She was diagnosed in September 2017.   The patient underwent systemic chemotherapy with carboplatin and etoposide.  She is status post 6 cycles.  She tolerated treatment well except for chemotherapy-induced anemia which required  pRBCs and platelet transfusions.  She had a significant improvement of her disease with chemotherapy.  The patient then underwent prophylactic cranial irradiation.  She then underwent stereotactic radiotherapy to the right upper lobe and pulmonary nodule.  She had been on observation for 2 years before showing evidence of disease progression.   She recently had been started on systemic chemotherapy with carboplatin, etoposide, and Tecentriq.  She is status post 11 cycles.  Starting from cycle #5, she has been on maintenance single agent Tecentriq.  She has been tolerating treatment well.   The patient recently had a restaging CT scan performed.  Dr. Julien Nordmann personally and independently reviewed the scan and discussed results with patient today.  The scan showed no evidence of disease progression. There was a slight increase in a consolidation of the peripheral right pulmonary apex. Dr. Julien Nordmann recommends continuing on treatment with no changes.   She will receive cycle #12 today as scheduled.  We will see the patient back for follow-up visit in 3 weeks for evaluation for starting cycle #13.  The patient's potassium was found to be slightly low on routine labs. I provided her a handout with potassium rich foods and encouraged her to increase the  potassium in her diet.   For her constipation from her pain medication use, I advised the patient to try to take a stool softener such as Colace or Senna one tablet twice a day everyday to avoid constipation.  These medications are available over the counter.  Of course, if she has diarrhea, she was instructed to stop taking stool softeners.  Drinking plenty of fluid, eating fruits and vegetable, and being active also reduces the risk of constipation. She will use Miralax if she does not have a bowel movement for 2 days or more than her normal bowel habit frequency.   She was advised she may take ibuprofen for her back pain if her pain medication causes nausea. She was also told she may take tylenol but to be cautious not to exceed the max daily dose of tylenol since her percocet contains tylenol.   No orders of the defined types were placed in this encounter.    Debra  L Heilingoetter, PA-C 03/18/19  ADDENDUM: Hematology/Oncology Attending: I had a face-to-face encounter with the patient today.  I recommended her care plan.  This is a very pleasant 58 years old white female with metastatic small cell lung cancer.  She was initially diagnosed in September 2017 status post several chemotherapy regimens. She is currently undergoing maintenance treatment with Tecentriq every 3 weeks status post 7 cycles.  The patient has been tolerating this treatment well with no concerning adverse effects. She had repeat CT scan of the chest, abdomen pelvis performed recently.  I personally and independently reviewed the scan and discussed the results with the patient today. Her scan showed no concerning findings for disease progression. I recommended for the patient to continue her current treatment with maintenance Tecentriq and she will proceed with cycle #8 today. I will see her back for follow-up visit in 3 weeks for evaluation before the next cycle of her treatment. The patient was advised to call  immediately if she has any concerning symptoms in the interval.  Disclaimer: This note was dictated with voice recognition software. Similar sounding words can inadvertently be transcribed and may be missed upon review. Eilleen Kempf, MD 03/18/19

## 2019-03-19 ENCOUNTER — Telehealth: Payer: Self-pay | Admitting: Internal Medicine

## 2019-03-19 NOTE — Telephone Encounter (Signed)
Scheduled appt per 7/16 los - pt to get an updated schedule next visit.

## 2019-03-22 ENCOUNTER — Other Ambulatory Visit: Payer: Self-pay | Admitting: Neurology

## 2019-03-22 DIAGNOSIS — I639 Cerebral infarction, unspecified: Secondary | ICD-10-CM

## 2019-03-22 DIAGNOSIS — I4891 Unspecified atrial fibrillation: Secondary | ICD-10-CM

## 2019-03-23 ENCOUNTER — Ambulatory Visit: Payer: Medicare Other

## 2019-03-23 ENCOUNTER — Ambulatory Visit: Payer: Medicare Other | Admitting: Internal Medicine

## 2019-03-23 ENCOUNTER — Other Ambulatory Visit: Payer: Medicare Other

## 2019-04-08 ENCOUNTER — Inpatient Hospital Stay (HOSPITAL_BASED_OUTPATIENT_CLINIC_OR_DEPARTMENT_OTHER): Payer: Medicare Other | Admitting: Physician Assistant

## 2019-04-08 ENCOUNTER — Encounter: Payer: Self-pay | Admitting: Physician Assistant

## 2019-04-08 ENCOUNTER — Inpatient Hospital Stay: Payer: Medicare Other

## 2019-04-08 ENCOUNTER — Inpatient Hospital Stay: Payer: Medicare Other | Attending: Internal Medicine

## 2019-04-08 ENCOUNTER — Other Ambulatory Visit: Payer: Self-pay

## 2019-04-08 VITALS — BP 116/83 | HR 88 | Temp 98.5°F | Resp 18 | Ht 63.0 in | Wt 123.7 lb

## 2019-04-08 DIAGNOSIS — I639 Cerebral infarction, unspecified: Secondary | ICD-10-CM | POA: Diagnosis not present

## 2019-04-08 DIAGNOSIS — Z5112 Encounter for antineoplastic immunotherapy: Secondary | ICD-10-CM | POA: Diagnosis not present

## 2019-04-08 DIAGNOSIS — Z791 Long term (current) use of non-steroidal anti-inflammatories (NSAID): Secondary | ICD-10-CM | POA: Diagnosis not present

## 2019-04-08 DIAGNOSIS — R110 Nausea: Secondary | ICD-10-CM | POA: Diagnosis not present

## 2019-04-08 DIAGNOSIS — Z923 Personal history of irradiation: Secondary | ICD-10-CM | POA: Diagnosis not present

## 2019-04-08 DIAGNOSIS — F329 Major depressive disorder, single episode, unspecified: Secondary | ICD-10-CM | POA: Diagnosis not present

## 2019-04-08 DIAGNOSIS — C3491 Malignant neoplasm of unspecified part of right bronchus or lung: Secondary | ICD-10-CM

## 2019-04-08 DIAGNOSIS — C3411 Malignant neoplasm of upper lobe, right bronchus or lung: Secondary | ICD-10-CM | POA: Diagnosis not present

## 2019-04-08 DIAGNOSIS — Z79899 Other long term (current) drug therapy: Secondary | ICD-10-CM | POA: Diagnosis not present

## 2019-04-08 DIAGNOSIS — Z85828 Personal history of other malignant neoplasm of skin: Secondary | ICD-10-CM | POA: Insufficient documentation

## 2019-04-08 DIAGNOSIS — G894 Chronic pain syndrome: Secondary | ICD-10-CM | POA: Insufficient documentation

## 2019-04-08 DIAGNOSIS — R630 Anorexia: Secondary | ICD-10-CM

## 2019-04-08 DIAGNOSIS — C7889 Secondary malignant neoplasm of other digestive organs: Secondary | ICD-10-CM | POA: Insufficient documentation

## 2019-04-08 DIAGNOSIS — Z7982 Long term (current) use of aspirin: Secondary | ICD-10-CM | POA: Diagnosis not present

## 2019-04-08 DIAGNOSIS — M810 Age-related osteoporosis without current pathological fracture: Secondary | ICD-10-CM | POA: Insufficient documentation

## 2019-04-08 DIAGNOSIS — Z9221 Personal history of antineoplastic chemotherapy: Secondary | ICD-10-CM | POA: Insufficient documentation

## 2019-04-08 DIAGNOSIS — I10 Essential (primary) hypertension: Secondary | ICD-10-CM | POA: Diagnosis not present

## 2019-04-08 DIAGNOSIS — D6481 Anemia due to antineoplastic chemotherapy: Secondary | ICD-10-CM | POA: Insufficient documentation

## 2019-04-08 DIAGNOSIS — E785 Hyperlipidemia, unspecified: Secondary | ICD-10-CM | POA: Insufficient documentation

## 2019-04-08 DIAGNOSIS — E876 Hypokalemia: Secondary | ICD-10-CM | POA: Insufficient documentation

## 2019-04-08 DIAGNOSIS — R5383 Other fatigue: Secondary | ICD-10-CM

## 2019-04-08 DIAGNOSIS — C778 Secondary and unspecified malignant neoplasm of lymph nodes of multiple regions: Secondary | ICD-10-CM | POA: Insufficient documentation

## 2019-04-08 LAB — CBC WITH DIFFERENTIAL (CANCER CENTER ONLY)
Abs Immature Granulocytes: 0.04 10*3/uL (ref 0.00–0.07)
Basophils Absolute: 0 10*3/uL (ref 0.0–0.1)
Basophils Relative: 0 %
Eosinophils Absolute: 0.3 10*3/uL (ref 0.0–0.5)
Eosinophils Relative: 4 %
HCT: 32.9 % — ABNORMAL LOW (ref 36.0–46.0)
Hemoglobin: 11.4 g/dL — ABNORMAL LOW (ref 12.0–15.0)
Immature Granulocytes: 1 %
Lymphocytes Relative: 14 %
Lymphs Abs: 1.2 10*3/uL (ref 0.7–4.0)
MCH: 38.5 pg — ABNORMAL HIGH (ref 26.0–34.0)
MCHC: 34.7 g/dL (ref 30.0–36.0)
MCV: 111.1 fL — ABNORMAL HIGH (ref 80.0–100.0)
Monocytes Absolute: 0.5 10*3/uL (ref 0.1–1.0)
Monocytes Relative: 6 %
Neutro Abs: 6.4 10*3/uL (ref 1.7–7.7)
Neutrophils Relative %: 75 %
Platelet Count: 283 10*3/uL (ref 150–400)
RBC: 2.96 MIL/uL — ABNORMAL LOW (ref 3.87–5.11)
RDW: 13.1 % (ref 11.5–15.5)
WBC Count: 8.4 10*3/uL (ref 4.0–10.5)
nRBC: 0 % (ref 0.0–0.2)

## 2019-04-08 LAB — CMP (CANCER CENTER ONLY)
ALT: 18 U/L (ref 0–44)
AST: 18 U/L (ref 15–41)
Albumin: 3.3 g/dL — ABNORMAL LOW (ref 3.5–5.0)
Alkaline Phosphatase: 139 U/L — ABNORMAL HIGH (ref 38–126)
Anion gap: 11 (ref 5–15)
BUN: 4 mg/dL — ABNORMAL LOW (ref 6–20)
CO2: 26 mmol/L (ref 22–32)
Calcium: 9.1 mg/dL (ref 8.9–10.3)
Chloride: 102 mmol/L (ref 98–111)
Creatinine: 0.74 mg/dL (ref 0.44–1.00)
GFR, Est AFR Am: >60 mL/min (ref >60)
GFR, Estimated: >60 mL/min (ref >60)
Glucose, Bld: 101 mg/dL — ABNORMAL HIGH (ref 70–99)
Potassium: 3.5 mmol/L (ref 3.5–5.1)
Sodium: 139 mmol/L (ref 135–145)
Total Bilirubin: 0.4 mg/dL (ref 0.3–1.2)
Total Protein: 6.9 g/dL (ref 6.5–8.1)

## 2019-04-08 LAB — TSH: TSH: 1.73 u[IU]/mL (ref 0.308–3.960)

## 2019-04-08 MED ORDER — SODIUM CHLORIDE 0.9 % IV SOLN
1200.0000 mg | Freq: Once | INTRAVENOUS | Status: AC
  Administered 2019-04-08: 1200 mg via INTRAVENOUS
  Filled 2019-04-08: qty 20

## 2019-04-08 MED ORDER — SODIUM CHLORIDE 0.9 % IV SOLN
Freq: Once | INTRAVENOUS | Status: AC
  Administered 2019-04-08: 15:00:00 via INTRAVENOUS
  Filled 2019-04-08: qty 250

## 2019-04-08 MED ORDER — SODIUM CHLORIDE 0.9% FLUSH
10.0000 mL | INTRAVENOUS | Status: DC | PRN
  Administered 2019-04-08: 10 mL
  Filled 2019-04-08: qty 10

## 2019-04-08 MED ORDER — HEPARIN SOD (PORK) LOCK FLUSH 100 UNIT/ML IV SOLN
500.0000 [IU] | Freq: Once | INTRAVENOUS | Status: AC | PRN
  Administered 2019-04-08: 16:00:00 500 [IU]
  Filled 2019-04-08: qty 5

## 2019-04-08 NOTE — Patient Instructions (Signed)
[facility] Discharge Instructions for Patients Receiving Chemotherapy  Today you received the following chemotherapy agents: Atezolizumab Gildardo Pounds)  To help prevent nausea and vomiting after your treatment, we encourage you to take your nausea medication as directed.    If you develop nausea and vomiting that is not controlled by your nausea medication, call the clinic.   BELOW ARE SYMPTOMS THAT SHOULD BE REPORTED IMMEDIATELY:  *FEVER GREATER THAN 100.5 F  *CHILLS WITH OR WITHOUT FEVER  NAUSEA AND VOMITING THAT IS NOT CONTROLLED WITH YOUR NAUSEA MEDICATION  *UNUSUAL SHORTNESS OF BREATH  *UNUSUAL BRUISING OR BLEEDING  TENDERNESS IN MOUTH AND THROAT WITH OR WITHOUT PRESENCE OF ULCERS  *URINARY PROBLEMS  *BOWEL PROBLEMS  UNUSUAL RASH Items with * indicate a potential emergency and should be followed up as soon as possible.  Feel free to call the clinic should you have any questions or concerns. The clinic phone number is [phone].  Please show the [facility] at check-in to the Emergency Department and triage nurse.   Hypokalemia Hypokalemia means that the amount of potassium in the blood is lower than normal. Potassium is a chemical (electrolyte) that helps regulate the amount of fluid in the body. It also stimulates muscle tightening (contraction) and helps nerves work properly. Normally, most of the body's potassium is inside cells, and only a very small amount is in the blood. Because the amount in the blood is so small, minor changes to potassium levels in the blood can be life-threatening. What are the causes? This condition may be caused by: Antibiotic medicine. Diarrhea or vomiting. Taking too much of a medicine that helps you have a bowel movement (laxative) can cause diarrhea and lead to hypokalemia. Chronic kidney disease (CKD). Medicines that help the body get rid of excess fluid (diuretics). Eating disorders, such as bulimia. Low  magnesium levels in the body. Sweating a lot. What are the signs or symptoms? Symptoms of this condition include: Weakness. Constipation. Fatigue. Muscle cramps. Mental confusion. Skipped heartbeats or irregular heartbeat (palpitations). Tingling or numbness. How is this diagnosed? This condition is diagnosed with a blood test. How is this treated? This condition may be treated by: Taking potassium supplements by mouth. Adjusting the medicines that you take. Eating more foods that contain a lot of potassium. If your potassium level is very low, you may need to get potassium through an IV and be monitored in the hospital. Follow these instructions at home:  Take over-the-counter and prescription medicines only as told by your health care provider. This includes vitamins and supplements. Eat a healthy diet. A healthy diet includes fresh fruits and vegetables, whole grains, healthy fats, and lean proteins. If instructed, eat more foods that contain a lot of potassium. This includes: Nuts, such as peanuts and pistachios. Seeds, such as sunflower seeds and pumpkin seeds. Peas, lentils, and lima beans. Whole grain and bran cereals and breads. Fresh fruits and vegetables, such as apricots, avocado, bananas, cantaloupe, kiwi, oranges, tomatoes, asparagus, and potatoes. Orange juice. Tomato juice. Red meats. Yogurt. Keep all follow-up visits as told by your health care provider. This is important. Contact a health care provider if you: Have weakness that gets worse. Feel your heart pounding or racing. Vomit. Have diarrhea. Have diabetes (diabetes mellitus) and you have trouble keeping your blood sugar (glucose) in your target range. Get help right away if you: Have chest pain. Have shortness of breath. Have vomiting or diarrhea that lasts for more than 2 days. Faint. Summary  Hypokalemia means that the amount of potassium in the blood is lower than normal. This condition is  diagnosed with a blood test. Hypokalemia may be treated by taking potassium supplements, adjusting the medicines that you take, or eating more foods that are high in potassium. If your potassium level is very low, you may need to get potassium through an IV and be monitored in the hospital. This information is not intended to replace advice given to you by your health care provider. Make sure you discuss any questions you have with your health care provider. Document Released: 08/19/2005 Document Revised: 04/01/2018 Document Reviewed: 04/01/2018 Elsevier Patient Education  2020 [employer].

## 2019-04-08 NOTE — Progress Notes (Signed)
[facility] OFFICE PROGRESS NOTE  [name], MD [address] [place] [address]  DIAGNOSIS: Extensive stage (T2a, N3, [name], large right anterior mediastinal and supraclavicular lymphadenopathy as well as pancreatic and splenic metastasis diagnosed in September 2017. The patient had disease progression in October 2019.   PRIOR THERAPY:  1) Systemic chemotherapy was carboplatin for AUC of 5 on day 1 and etoposide 100 MG/M2 on days 1, 2 and 3 with Neulasta support. Status post 6 cycles with significant response of her disease. 2) Prophylactic cranial irradiation under the care of Dr. Sondra Come on 12/02/2016. 3) stereotactic radiotherapy to the recurrent right upper lobe pulmonary nodule under the care of Dr. Sondra Come completed November 10, 2017. 4)Retreatment with systemic chemotherapy with carboplatin for AUC of 5 on day 1 and etoposide 100 mg/M2 on days 1, 2 and 3 as well as Tecentriq (Atezolizumab) 1200 mg IV every 3 weeks with Neulasta support. First dose June 22, 2018 for disease recurrence. Status post 5 cycles. Starting from cycle #2 her dose of carboplatin will be reduced to AUC of 4 and etoposide 80 mg/M2 on days 1, 2 and 3 in addition to the regular dose of Tecentriq.  CURRENT THERAPY: Maintenance treatment with single agent Tecentriq 1200 mg IV every 3 weeks. Status post8cycles.  INTERVAL HISTORY: Debra Kidd 58 y.o. female returns to the clinic for a follow up visit. The patient is feeling well today without any concerning complaints except for a mildly decreased appetite. The patient has lost ~2 lbs since her last visit and she states that smelling foods that were once appetizing now make her feel nauseous. At her last visit, the patient had mild hypokalemia and was encouraged to increase her consumption of foods rich in potassium. The patient states that she has been making these dietary  modifications. Otherwise, the patient is feeling well and denies any fevers, chills, or night sweats. She denies any chest pain, shortness of breath, cough, or hemoptysis. She denies any vomiting, diarrhea, or constipation. She denies any headaches or visual changes. She denies any rashes or skin changes except for occasional mildly red dry lesions which appear periodically. The patient states that these do not bother her except they are occasionally mildly pruritic. She is here today for evaluation prior to starting cycle #8 of single agent Tecentriq today.   MEDICAL HISTORY: Past Medical History:  Diagnosis Date  . 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 Blood type, Rh positive   . Cancer ([facility])   . Chronic pain syndrome   . Depression 08/07/2016  . Encounter for antineoplastic chemotherapy 07/17/2016  . History of external beam radiation therapy 11/19/16-12/02/16   brain 25 Gy in 10 fractions  . Hyperlipidemia   . Hypertension   . Hypertension 08/07/2016  . Osteoporosis   . Small cell lung cancer ([facility])     ALLERGIES:  is allergic to codeine; motrin [ibuprofen]; thiazide-type diuretics; and vicodin [hydrocodone-acetaminophen].  MEDICATIONS:  Current Outpatient Medications  Medication Sig Dispense Refill  . aspirin EC 81 MG tablet Take 1 tablet (81 mg total) by mouth daily. 150 tablet 2  . atorvastatin (LIPITOR) 80 MG tablet Take 1 tablet (80 mg total) by mouth daily at 6 PM. 30 tablet 0  . celecoxib (CELEBREX) 200 MG capsule Take 200 mg by mouth daily as needed for mild pain.     Marland Kitchen clopidogrel (  PLAVIX) 75 MG tablet Take 1 tablet (75 mg total) by mouth daily. 30 tablet 0  . diazepam (VALIUM) 5 MG tablet Take 5 mg by mouth 3 (three) times daily as needed for anxiety.   2  . diphenhydrAMINE (BENADRYL) 25 MG tablet Take 25 mg by mouth every 6 (six) hours as needed for itching or allergies.     Marland Kitchen lidocaine-prilocaine (EMLA) cream Apply 1 application topically as needed. 30 g 0  .  loperamide (IMODIUM A-D) 2 MG tablet Take 2 mg by mouth 4 (four) times daily as needed for diarrhea or loose stools.    . magnesium oxide (MAG-OX) 400 (241.3 Mg) MG tablet Take 1 tablet (400 mg total) by mouth daily. 30 tablet 0  . mirtazapine (REMERON) 15 MG tablet Take 1 tablet (15 mg total) by mouth at bedtime. 30 tablet 1  . oxyCODONE-acetaminophen (PERCOCET) 5-325 MG tablet Take 1 tablet by mouth every 6 (six) hours as needed for severe pain. 30 tablet 0  . polyethylene glycol (MIRALAX / GLYCOLAX) packet Take 17 g by mouth daily. 14 each 0  . potassium chloride 20 MEQ/15ML (10%) SOLN Take 30 mLs (40 mEq total) by mouth daily. 90 mL 0  . prochlorperazine (COMPAZINE) 10 MG tablet Take 1 tablet (10 mg total) by mouth every 6 (six) hours as needed for nausea or vomiting. 30 tablet 0  . senna-docusate (SENOKOT-S) 8.6-50 MG tablet Take 2 tablets by mouth at bedtime. 5 tablet 0  . traMADol (ULTRAM) 50 MG tablet Take 50 mg by mouth every 6 (six) hours as needed for moderate pain.     . Vitamin D, Ergocalciferol, (DRISDOL) 1.25 MG (50000 UT) CAPS capsule Take 1 capsule (50,000 Units total) by mouth every 7 (seven) days. 5 capsule 0   No current facility-administered medications for this visit.    Facility-Administered Medications Ordered in Other Visits  Medication Dose Route Frequency Provider Last Rate Last Dose  . atezolizumab (TECENTRIQ) 1,200 mg in sodium chloride 0.9 % 250 mL chemo infusion  1,200 mg Intravenous Once [name], MD      . heparin lock flush 100 unit/mL  500 Units Intracatheter Once PRN [name], MD      . sodium chloride flush (NS) 0.9 % injection 10 mL  10 mL Intracatheter PRN [name], MD        SURGICAL HISTORY:  Past Surgical History:  Procedure Laterality Date  . ABDOMINAL HYSTERECTOMY  1981  . APPENDECTOMY     at age 74  . EYE SURGERY     left  . IR GENERIC HISTORICAL  06/03/2016   IR FLUORO GUIDE PORT INSERTION RIGHT 06/03/2016 [facility]INTER[facility]   . IR GENERIC HISTORICAL  06/03/2016   IR US GUIDE VASC ACCESS RIGHT 06/03/2016 [facility]  . SKIN CANCER EXCISION     basal cell carcinoma L side of face    REVIEW OF SYSTEMS:   Review of Systems  Constitutional: Positive for decreased appetite and mild weight loss. Negative for chills, fatigue, and fever. HENT:   Negative for mouth sores, nosebleeds, sore throat and trouble swallowing.   Eyes: Negative for eye problems and icterus.  Respiratory: Negative for cough, hemoptysis, [name], constipation, diarrhea, nausea and vomiting.  Genitourinary: Negative for bladder incontinence, difficulty urinating, dysuria, frequency and hematuria.   Musculoskeletal: Negative for back pain, gait problem, neck pain and neck stiffness.  Skin: Positive for rare small, dry, red lesions. Positive for mild itching.Neurological: Negative for dizziness, extremity weakness, gait problem, headaches, [name], depression and sleep disturbance. The patient is not nervous/anxious.     PHYSICAL EXAMINATION:  Blood pressure 116/83, pulse 88, temperature 98.5 F (36.9 C), temperature source Oral, resp. rate 18, height 5\' 3"  (1.6 m), weight 123 lb 11.2 oz (56.1 kg), SpO2 99 %.  ECOG PERFORMANCE STATUS: 1 - Symptomatic but completely ambulatory  Physical Exam  Constitutional: Oriented to person, place, and time and chronically ill appearing female and in no distress.   HENT:  Head: Normocephalic and atraumatic.  Mouth/Throat: Oropharynx is clear and moist. No oropharyngeal exudate.  Eyes: Conjunctivae are normal. Right eye exhibits no discharge. Left eye exhibits no discharge. No scleral icterus.  Neck: Normal range of motion. Neck supple.   Cardiovascular: Normal rate, regular rhythm, normal heart sounds and intact distal pulses.   Pulmonary/Chest: Effort normal and breath sounds normal. No respiratory distress. No wheezes. No rales.  Abdominal: Soft. Bowel sounds are normal. Exhibits no distension and no mass. There is no tenderness.  Musculoskeletal: Normal range of motion. Exhibits no edema.  Lymphadenopathy:    No cervical adenopathy.  Neurological: Alert and oriented to person, place, and time. Exhibits normal muscle tone. Gait normal. Coordination normal.  Skin: Skin is warm and dry. No rash noted. Not diaphoretic. No erythema. No pallor.  Psychiatric: Mood, memory and judgment normal.  Vitals reviewed.  LABORATORY DATA: Lab Results  Component Value Date   WBC 8.4 04/08/2019   HGB 11.4 (L) 04/08/2019   HCT 32.9 (L) 04/08/2019   MCV 111.1 (H) 04/08/2019   PLT 283 04/08/2019      Chemistry      Component Value Date/Time   NA 139 04/08/2019 1300   NA 141 08/01/2017 0835   K 3.5 04/08/2019 1300   K 3.5 08/01/2017 0835   CL 102 04/08/2019 1300   CO2 26 04/08/2019 1300   CO2 25 08/01/2017 0835   BUN 4 (L) 04/08/2019 1300   BUN 8.0 08/01/2017 0835   CREATININE 0.74 04/08/2019 1300   CREATININE 0.9 08/01/2017 0835      Component Value Date/Time   CALCIUM 9.1 04/08/2019 1300   CALCIUM 9.9 08/01/2017 0835   ALKPHOS 139 (H) 04/08/2019 1300   ALKPHOS 142 08/01/2017 0835   AST 18 04/08/2019 1300   AST 15 08/01/2017 0835   ALT 18 04/08/2019 1300   ALT 19 08/01/2017 0835   BILITOT 0.4 04/08/2019 1300   BILITOT 0.35 08/01/2017 0835       RADIOGRAPHIC STUDIES:  Ct Chest W Contrast  Result Date: 03/17/2019 CLINICAL DATA:  Restaging metastatic lung cancer EXAM: CT CHEST, ABDOMEN, AND PELVIS WITH CONTRAST TECHNIQUE: Multidetector CT imaging of the chest, abdomen and pelvis was performed following the standard protocol during bolus administration of intravenous contrast. CONTRAST:  130mL OMNIPAQUE IOHEXOL 300  MG/ML SOLN, additional oral enteric contrast COMPARISON:  12/25/2018, 10/02/2018 FINDINGS: CT CHEST FINDINGS Cardiovascular: Right chest port catheter. Aortic atherosclerosis. Normal heart size. Left coronary artery calcifications. No pericardial effusion. Mediastinum/Nodes: No enlarged mediastinal, hilar, or axillary lymph nodes. Unchanged pretracheal and right hilar soft tissue thickening. Thyroid gland, trachea, and esophagus demonstrate no significant findings. Lungs/Pleura: Slight interval increase in consolidation of the peripheral right pulmonary apex, measuring approximately 3.2 x 2.4 cm (series 4, image 31). A pulmonary nodule previously seen within this  opacity is not clearly appreciated on current examination. No pleural effusion or pneumothorax. Musculoskeletal: No chest wall mass or suspicious bone lesions identified. No change in wedge deformities of T7 through T9. CT ABDOMEN PELVIS FINDINGS Hepatobiliary: No solid liver abnormality is seen. No gallstones, gallbladder wall thickening, or biliary dilatation. Pancreas: Unremarkable. No pancreatic ductal dilatation or surrounding inflammatory changes. Spleen: Normal in size without significant abnormality. Adrenals/Urinary Tract: Adrenal glands are unremarkable. Kidneys are normal, without renal calculi, solid lesion, or hydronephrosis. Bladder is unremarkable. Stomach/Bowel: Stomach is within normal limits. No evidence of bowel wall thickening, distention, or inflammatory changes. Soft tissue stranding of the small bowel mesentery unchanged from prior (series 2, image 83, series 5, image 40). Vascular/Lymphatic: Severe aortic atherosclerosis. No enlarged abdominal or pelvic lymph nodes. Reproductive: Status post hysterectomy. Other: No abdominal wall hernia or abnormality. No abdominopelvic ascites. Musculoskeletal: No change in superior endplate deformity of L2. IMPRESSION: 1. Slight interval increase in consolidation of the peripheral right pulmonary  apex, measuring approximately 3.2 x 2.4 cm (series 4, image 31). A pulmonary nodule previously seen within this opacity is not clearly appreciated on current examination. Findings are most consistent with continued evolution of post treatment/post radiation change. 2. Unchanged pretracheal and right hilar soft tissue thickening. 3. No change in wedge deformities of T7 through T9. No change in superior endplate deformity of L2. 4. Soft tissue stranding of the small bowel mesentery unchanged from prior ( "Misty mesentery" sign), likely related to nonspecific infectious or inflammatory enteritis. Attention on follow-up. 5. Other chronic, incidental, and postoperative findings as detailed above. Electronically Signed   By: Eddie Candle M.D.   On: 03/17/2019 10:34   Ct Abdomen Pelvis W Contrast  Result Date: 03/17/2019 CLINICAL DATA:  Restaging metastatic lung cancer EXAM: CT CHEST, ABDOMEN, AND PELVIS WITH CONTRAST TECHNIQUE: Multidetector CT imaging of the chest, abdomen and pelvis was performed following the standard protocol during bolus administration of intravenous contrast. CONTRAST:  143mL OMNIPAQUE IOHEXOL 300 MG/ML SOLN, additional oral enteric contrast COMPARISON:  12/25/2018, 10/02/2018 FINDINGS: CT CHEST FINDINGS Cardiovascular: Right chest port catheter. Aortic atherosclerosis. Normal heart size. Left coronary artery calcifications. No pericardial effusion. Mediastinum/Nodes: No enlarged mediastinal, hilar, or axillary lymph nodes. Unchanged pretracheal and right hilar soft tissue thickening. Thyroid gland, trachea, and esophagus demonstrate no significant findings. Lungs/Pleura: Slight interval increase in consolidation of the peripheral right pulmonary apex, measuring approximately 3.2 x 2.4 cm (series 4, image 31). A pulmonary nodule previously seen within this opacity is not clearly appreciated on current examination. No pleural effusion or pneumothorax. Musculoskeletal: No chest wall mass or  suspicious bone lesions identified. No change in wedge deformities of T7 through T9. CT ABDOMEN PELVIS FINDINGS Hepatobiliary: No solid liver abnormality is seen. No gallstones, gallbladder wall thickening, or biliary dilatation. Pancreas: Unremarkable. No pancreatic ductal dilatation or surrounding inflammatory changes. Spleen: Normal in size without significant abnormality. Adrenals/Urinary Tract: Adrenal glands are unremarkable. Kidneys are normal, without renal calculi, solid lesion, or hydronephrosis. Bladder is unremarkable. Stomach/Bowel: Stomach is within normal limits. No evidence of bowel wall thickening, distention, or inflammatory changes. Soft tissue stranding of the small bowel mesentery unchanged from prior (series 2, image 83, series 5, image 40). Vascular/Lymphatic: Severe aortic atherosclerosis. No enlarged abdominal or pelvic lymph nodes. Reproductive: Status post hysterectomy. Other: No abdominal wall hernia or abnormality. No abdominopelvic ascites. Musculoskeletal: No change in superior endplate deformity of L2. IMPRESSION: 1. Slight interval increase in consolidation of the peripheral right pulmonary apex, measuring approximately 3.2 x 2.4  cm (series 4, image 31). A pulmonary nodule previously seen within this opacity is not clearly appreciated on current examination. Findings are most consistent with continued evolution of post treatment/post radiation change. 2. Unchanged pretracheal and right hilar soft tissue thickening. 3. No change in wedge deformities of T7 through T9. No change in superior endplate deformity of L2. 4. Soft tissue stranding of the small bowel mesentery unchanged from prior ( "Misty mesentery" sign), likely related to nonspecific infectious or inflammatory enteritis. Attention on follow-up. 5. Other chronic, incidental, and postoperative findings as detailed above. Electronically Signed   By: Eddie Candle M.D.   On: 03/17/2019 10:34     ASSESSMENT/PLAN:  This is a  very pleasant 58 year old Caucasian female with extensive stage small cell lung cancer. She presented with a right upper lobe lung mass, large right anterior mediastinal and supraclavicular lymphadenopathy as well as a pancreatic andsplenic metastasis. She was diagnosed in September 2017.  The patient underwent systemic chemotherapy with carboplatin and etoposide. She is status post 6 cycles. She tolerated treatment well except for chemotherapy-induced anemia which required pRBCs and platelet transfusions. She had a significant improvement of her disease with chemotherapy. The patientthenunderwent prophylactic cranial irradiation. She then underwent stereotactic radiotherapy to the right upper lobe and pulmonary nodule.  She had been on observation for 2 years before showing evidence of disease progression.   She recently hadbeen started on systemic chemotherapy with carboplatin, etoposide, and Tecentriq. She is status post 12 cycles. Starting from cycle #5, she has been on maintenance single agent Tecentriq. She has been tolerating treatment well.   The patient was seen with Dr. Julien Nordmann today. Labs were reviewed. We recommend that she proceed with cycle #13 today as scheduled.   We will see her back for a follow up visit in a 3 weeks for evaluation before starting cycle #14.   The patient has lost a few pounds since her last visit. She states she does not have that much of an appetite and that foods that she once enjoyed are no longer appealing to her. We discussed modifying her diet to find foods that are appetizing to her. We also discussed ways to increase her caloric intake.   Her potassium tends be low on routine labs. She has been trying to modify her diet with potassium rich foods. Her potassium is within normal limits today. She was encouraged to continue managing her potassium via dietary changes.   The patient was advised to call immediately if she has any concerning symptoms  in the interval. The patient voices understanding of current disease status and treatment options and is in agreement with the current care plan. All questions were answered. The patient knows to call the clinic with any problems, questions or concerns. We can certainly see the patient much sooner if necessary  No orders of the defined types were placed in this encounter.    Karleen Seebeck L Atwell Mcdanel, PA-C 04/08/19  ADDENDUM: Hematology/Oncology Attending: I had a face-to-face encounter with the patient today.  I recommended her care plan.  This is a very pleasant 59 years old white female with recurrent extensive stage small cell lung cancer status post induction systemic chemotherapy with carboplatin, etoposide and Tecentriq with partial response.  She is currently undergoing treatment with maintenance Tecentriq status post 7 cycles.  She has been tolerating this treatment well with no concerning adverse effects. I recommended for her to proceed with cycle #8 of the maintenance therapy today as planned. She will come back  for follow-up visit in 3 weeks for evaluation before the next cycle of her treatment. She was advised to call immediately if she has any concerning symptoms in the interval.  Disclaimer: This note was dictated with voice recognition software. Similar sounding words can inadvertently be transcribed and may be missed upon review. Eilleen Kempf, MD 04/08/19

## 2019-04-29 ENCOUNTER — Inpatient Hospital Stay: Payer: Medicare Other

## 2019-04-29 ENCOUNTER — Inpatient Hospital Stay (HOSPITAL_BASED_OUTPATIENT_CLINIC_OR_DEPARTMENT_OTHER): Payer: Medicare Other | Admitting: Internal Medicine

## 2019-04-29 ENCOUNTER — Other Ambulatory Visit: Payer: Self-pay

## 2019-04-29 ENCOUNTER — Encounter: Payer: Self-pay | Admitting: Internal Medicine

## 2019-04-29 VITALS — BP 114/84 | HR 87 | Temp 98.7°F | Resp 18 | Ht 63.0 in | Wt 124.4 lb

## 2019-04-29 DIAGNOSIS — Z5112 Encounter for antineoplastic immunotherapy: Secondary | ICD-10-CM | POA: Diagnosis not present

## 2019-04-29 DIAGNOSIS — R5383 Other fatigue: Secondary | ICD-10-CM

## 2019-04-29 DIAGNOSIS — I639 Cerebral infarction, unspecified: Secondary | ICD-10-CM | POA: Diagnosis not present

## 2019-04-29 DIAGNOSIS — C3491 Malignant neoplasm of unspecified part of right bronchus or lung: Secondary | ICD-10-CM

## 2019-04-29 LAB — CMP (CANCER CENTER ONLY)
ALT: 10 U/L (ref 0–44)
AST: 13 U/L — ABNORMAL LOW (ref 15–41)
Albumin: 3.3 g/dL — ABNORMAL LOW (ref 3.5–5.0)
Alkaline Phosphatase: 129 U/L — ABNORMAL HIGH (ref 38–126)
Anion gap: 9 (ref 5–15)
BUN: <4 mg/dL — ABNORMAL LOW (ref 6–20)
CO2: 26 mmol/L (ref 22–32)
Calcium: 8.8 mg/dL — ABNORMAL LOW (ref 8.9–10.3)
Chloride: 104 mmol/L (ref 98–111)
Creatinine: 0.75 mg/dL (ref 0.44–1.00)
GFR, Est AFR Am: >60 mL/min (ref >60)
GFR, Estimated: >60 mL/min (ref >60)
Glucose, Bld: 96 mg/dL (ref 70–99)
Potassium: 3.5 mmol/L (ref 3.5–5.1)
Sodium: 139 mmol/L (ref 135–145)
Total Bilirubin: 0.3 mg/dL (ref 0.3–1.2)
Total Protein: 6.8 g/dL (ref 6.5–8.1)

## 2019-04-29 LAB — CBC WITH DIFFERENTIAL (CANCER CENTER ONLY)
Abs Immature Granulocytes: 0.05 10*3/uL (ref 0.00–0.07)
Basophils Absolute: 0 10*3/uL (ref 0.0–0.1)
Basophils Relative: 0 %
Eosinophils Absolute: 0.3 10*3/uL (ref 0.0–0.5)
Eosinophils Relative: 3 %
HCT: 34.2 % — ABNORMAL LOW (ref 36.0–46.0)
Hemoglobin: 11.6 g/dL — ABNORMAL LOW (ref 12.0–15.0)
Immature Granulocytes: 1 %
Lymphocytes Relative: 19 %
Lymphs Abs: 1.8 10*3/uL (ref 0.7–4.0)
MCH: 38.2 pg — ABNORMAL HIGH (ref 26.0–34.0)
MCHC: 33.9 g/dL (ref 30.0–36.0)
MCV: 112.5 fL — ABNORMAL HIGH (ref 80.0–100.0)
Monocytes Absolute: 0.5 10*3/uL (ref 0.1–1.0)
Monocytes Relative: 5 %
Neutro Abs: 7.1 10*3/uL (ref 1.7–7.7)
Neutrophils Relative %: 72 %
Platelet Count: 279 10*3/uL (ref 150–400)
RBC: 3.04 MIL/uL — ABNORMAL LOW (ref 3.87–5.11)
RDW: 13 % (ref 11.5–15.5)
WBC Count: 9.8 10*3/uL (ref 4.0–10.5)
nRBC: 0 % (ref 0.0–0.2)

## 2019-04-29 LAB — TSH: TSH: 2.124 u[IU]/mL (ref 0.308–3.960)

## 2019-04-29 MED ORDER — SODIUM CHLORIDE 0.9% FLUSH
10.0000 mL | INTRAVENOUS | Status: DC | PRN
  Administered 2019-04-29: 10 mL
  Filled 2019-04-29: qty 10

## 2019-04-29 MED ORDER — SODIUM CHLORIDE 0.9 % IV SOLN
1200.0000 mg | Freq: Once | INTRAVENOUS | Status: AC
  Administered 2019-04-29: 1200 mg via INTRAVENOUS
  Filled 2019-04-29: qty 20

## 2019-04-29 MED ORDER — HEPARIN SOD (PORK) LOCK FLUSH 100 UNIT/ML IV SOLN
500.0000 [IU] | Freq: Once | INTRAVENOUS | Status: AC | PRN
  Administered 2019-04-29: 500 [IU]
  Filled 2019-04-29: qty 5

## 2019-04-29 MED ORDER — SODIUM CHLORIDE 0.9% FLUSH
10.0000 mL | INTRAVENOUS | Status: DC | PRN
  Administered 2019-04-29: 11:00:00 10 mL
  Filled 2019-04-29: qty 10

## 2019-04-29 MED ORDER — SODIUM CHLORIDE 0.9 % IV SOLN
Freq: Once | INTRAVENOUS | Status: AC
  Administered 2019-04-29: 13:00:00 via INTRAVENOUS
  Filled 2019-04-29: qty 250

## 2019-04-29 NOTE — Patient Instructions (Signed)
[facility] Discharge Instructions for Patients Receiving Chemotherapy  Today you received the following chemotherapy agents: Tecentriq  To help prevent nausea and vomiting after your treatment, we encourage you to take your nausea medication as directed.   If you develop nausea and vomiting that is not controlled by your nausea medication, call the clinic.   BELOW ARE SYMPTOMS THAT SHOULD BE REPORTED IMMEDIATELY:  *FEVER GREATER THAN 100.5 F  *CHILLS WITH OR WITHOUT FEVER  NAUSEA AND VOMITING THAT IS NOT CONTROLLED WITH YOUR NAUSEA MEDICATION  *UNUSUAL SHORTNESS OF BREATH  *UNUSUAL BRUISING OR BLEEDING  TENDERNESS IN MOUTH AND THROAT WITH OR WITHOUT PRESENCE OF ULCERS  *URINARY PROBLEMS  *BOWEL PROBLEMS  UNUSUAL RASH Items with * indicate a potential emergency and should be followed up as soon as possible.  Feel free to call the clinic should you have any questions or concerns. The clinic phone number is [phone].  Please show the CHEMO ALERT CARD at check-in to the Emergency Department and triage nurse.   

## 2019-04-29 NOTE — Progress Notes (Signed)
[address] [phone]   [phone]  OFFICE PROGRESS NOTE  [name], MD [address] [place] [address]  DIAGNOSIS: Extensive stage (T2a, N3, [name], large right anterior mediastinal and supraclavicular lymphadenopathy as well as pancreatic and splenic metastasis diagnosed in September 2017.  The patient had disease progression in October 2019.  PRIOR THERAPY:  1) Systemic chemotherapy was carboplatin for AUC of 5 on day 1 and etoposide 100 MG/M2 on days 1, 2 and 3 with Neulasta support. Status post 6 cycles with significant response of her disease. 2) Prophylactic cranial irradiation under the care of Dr. Sondra Come on 12/02/2016. 3) stereotactic radiotherapy to the recurrent right upper lobe pulmonary nodule under the care of Dr. Sondra Come completed November 10, 2017. 4) Retreatment with systemic chemotherapy with carboplatin for AUC of 5 on day 1 and etoposide 100 mg/M2 on days 1, 2 and 3 as well as Tecentriq (Atezolizumab) 1200 mg IV every 3 weeks with Neulasta support.  First dose June 22, 2018 for disease recurrence.  Status post 5 cycles.  Starting from cycle #2 her dose of carboplatin will be reduced to AUC of 4 and etoposide 80 mg/M2 on days 1, 2 and 3 in addition to the regular dose of Tecentriq.  CURRENT THERAPY: Maintenance treatment with single agent Tecentriq 1200 mg IV every 3 weeks.  Status post 9 cycles.  INTERVAL HISTORY: Debra Kidd 58 y.o. female returns to the clinic today for follow-up visit.  The patient is feeling fine today with no concerning complaints.  She has been tolerating her treatment with Tecentriq fairly well.  She denied having any skin rash or pruritus.  She has no nausea, vomiting, diarrhea or constipation.  She has no headache or visual changes.  She denied having any chest pain, shortness of breath, cough or hemoptysis.  She is here today for  evaluation before starting cycle #10 of her maintenance therapy.   MEDICAL HISTORY: Past Medical History:  Diagnosis Date  . 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 Blood type, Rh positive   . Cancer ([place])   . Chronic pain syndrome   . Depression 08/07/2016  . Encounter for antineoplastic chemotherapy 07/17/2016  . History of external beam radiation therapy 11/19/16-12/02/16   brain 25 Gy in 10 fractions  . Hyperlipidemia   . Hypertension   . Hypertension 08/07/2016  . Osteoporosis   . Small cell lung cancer (HCC)     ALLERGIES:  is allergic to codeine; motrin [ibuprofen]; thiazide-type diuretics; and vicodin [hydrocodone-acetaminophen].  MEDICATIONS:  Current Outpatient Medications  Medication Sig Dispense Refill  . aspirin EC 81 MG tablet Take 1 tablet (81 mg total) by mouth daily. 150 tablet 2  . atorvastatin (LIPITOR) 80 MG tablet Take 1 tablet (80 mg total) by mouth daily at 6 PM. 30 tablet 0  . celecoxib (CELEBREX) 200 MG capsule Take 200 mg by mouth daily as needed for mild pain.     Marland Kitchen clopidogrel (PLAVIX) 75 MG tablet Take 1 tablet (75 mg total) by mouth daily. 30 tablet 0  . diazepam (VALIUM) 5 MG tablet Take 5 mg by mouth 3 (three) times daily as needed for anxiety.   2  . diphenhydrAMINE (BENADRYL) 25 MG tablet Take 25 mg by mouth every 6 (six) hours as needed for itching or allergies.     Marland Kitchen lidocaine-prilocaine (  EMLA) cream Apply 1 application topically as needed. 30 g 0  . loperamide (IMODIUM A-D) 2 MG tablet Take 2 mg by mouth 4 (four) times daily as needed for diarrhea or loose stools.    . magnesium oxide (MAG-OX) 400 (241.3 Mg) MG tablet Take 1 tablet (400 mg total) by mouth daily. 30 tablet 0  . mirtazapine (REMERON) 15 MG tablet Take 1 tablet (15 mg total) by mouth at bedtime. 30 tablet 1  . oxyCODONE-acetaminophen (PERCOCET) 5-325 MG tablet Take 1 tablet by mouth every 6 (six) hours as needed for severe pain. 30 tablet 0  . polyethylene glycol (MIRALAX  / GLYCOLAX) packet Take 17 g by mouth daily. 14 each 0  . potassium chloride 20 MEQ/15ML (10%) SOLN Take 30 mLs (40 mEq total) by mouth daily. 90 mL 0  . prochlorperazine (COMPAZINE) 10 MG tablet Take 1 tablet (10 mg total) by mouth every 6 (six) hours as needed for nausea or vomiting. 30 tablet 0  . senna-docusate (SENOKOT-S) 8.6-50 MG tablet Take 2 tablets by mouth at bedtime. 5 tablet 0  . traMADol (ULTRAM) 50 MG tablet Take 50 mg by mouth every 6 (six) hours as needed for moderate pain.     . Vitamin D, Ergocalciferol, (DRISDOL) 1.25 MG (50000 UT) CAPS capsule Take 1 capsule (50,000 Units total) by mouth every 7 (seven) days. 5 capsule 0   No current facility-administered medications for this visit.     SURGICAL HISTORY:  Past Surgical History:  Procedure Laterality Date  . ABDOMINAL HYSTERECTOMY  1981  . APPENDECTOMY     at age 42  . EYE SURGERY     left  . IR GENERIC HISTORICAL  06/03/2016   IR FLUORO GUIDE PORT INSERTION RIGHT 06/03/2016 [facility]  . IR GENERIC HISTORICAL  06/03/2016   IR US GUIDE VASC ACCESS RIGHT 06/03/2016 [facility]  . SKIN CANCER EXCISION     basal cell carcinoma L side of face    REVIEW OF SYSTEMS:  A comprehensive review of systems was negative.   PHYSICAL EXAMINATION: General appearance: alert, cooperative and no distress Head: Normocephalic, without obvious abnormality, atraumatic Neck: no adenopathy, no JVD, supple, symmetrical, trachea midline and thyroid not enlarged, symmetric, no tenderness/mass/nodules Lymph nodes: Cervical, supraclavicular, and axillary nodes normal. Resp: clear to auscultation bilaterally Back: symmetric, no curvature. ROM normal. No CVA tenderness. Cardio: regular rate and rhythm, S1, S2 normal, no murmur, click, rub or gallop GI: soft, non-tender; bowel sounds normal; no masses,  no organomegaly Extremities: extremities normal, atraumatic, no cyanosis or edema  ECOG PERFORMANCE STATUS: 1 - Symptomatic but completely  ambulatory  Blood pressure 114/84, pulse 87, temperature 98.7 F (37.1 C), temperature source Oral, resp. rate 18, height 5\' 3"  (1.6 m), weight 124 lb 6.4 oz (56.4 kg), SpO2 100 %.  LABORATORY DATA: Lab Results  Component Value Date   WBC 9.8 04/29/2019   HGB 11.6 (L) 04/29/2019   HCT 34.2 (L) 04/29/2019   MCV 112.5 (H) 04/29/2019   PLT 279 04/29/2019      Chemistry      Component Value Date/Time   NA 139 04/08/2019 1300   NA 141 08/01/2017 0835   K 3.5 04/08/2019 1300   K 3.5 08/01/2017 0835   CL 102 04/08/2019 1300   CO2 26 04/08/2019 1300   CO2 25 08/01/2017 0835   BUN 4 (L) 04/08/2019 1300   BUN 8.0 08/01/2017 0835   CREATININE 0.74 04/08/2019 1300   CREATININE 0.9 08/01/2017 0835  Component Value Date/Time   CALCIUM 9.1 04/08/2019 1300   CALCIUM 9.9 08/01/2017 0835   ALKPHOS 139 (H) 04/08/2019 1300   ALKPHOS 142 08/01/2017 0835   AST 18 04/08/2019 1300   AST 15 08/01/2017 0835   ALT 18 04/08/2019 1300   ALT 19 08/01/2017 0835   BILITOT 0.4 04/08/2019 1300   BILITOT 0.35 08/01/2017 0835       RADIOGRAPHIC STUDIES: No results found.  ASSESSMENT AND PLAN:  This is a very pleasant 58 years old white female with extensive stage small cell lung cancer status post systemic chemotherapy with carbo platinum and etoposide for 6 cycles and the patient rotated her treatment well except for chemotherapy-induced anemia and requirement for PRBCs and platelet transfusion. She had significant improvement in her disease with the chemotherapy. She also had prophylactic cranial irradiation. She also underwent stereotactic radiotherapy to progressive right upper lobe pulmonary nodule. The patient has been in observation for close to 2 years. Repeat CT scan of the chest, abdomen and pelvis performed recently showed evidence for disease progression in the abdomen. The patient was started on systemic chemotherapy again with carboplatin, etoposide and Tecentriq status post 13  cycles.  Starting from cycle #5 the patient is treated with maintenance single agent Tecentriq. The patient continues to tolerate this treatment well with no concerning adverse effects. I recommended for her to proceed with cycle #10 of her maintenance therapy today as planned. She will come back for follow-up visit in 3 weeks for evaluation before the next cycle of her treatment. The patient was advised to call immediately if she has any concerning symptoms in the interval. The patient voices understanding of current disease status and treatment options and is in agreement with the current care plan. All questions were answered. The patient knows to call the clinic with any problems, questions or concerns. We can certainly see the patient much sooner if necessary.  Disclaimer: This note was dictated with voice recognition software. Similar sounding words can inadvertently be transcribed and may not be corrected upon review.

## 2019-05-18 ENCOUNTER — Telehealth: Payer: Self-pay

## 2019-05-18 NOTE — Telephone Encounter (Signed)
Notes recorded by Marval Regal, RN on 05/18/2019 at 9:56 AM EDT  Tried to call pts listed number. Unable to leave vm.  ------

## 2019-05-18 NOTE — Telephone Encounter (Signed)
-----   Message from Rosalin Hawking, MD sent at 05/12/2019  1:12 PM EDT ----- Could you please let the patient know that the heart monitoring test done recently was negative for irregular heart beat. Please continue current treatment. Thanks.  Rosalin Hawking, MD PhD Stroke Neurology 05/12/2019 1:11 PM

## 2019-05-20 ENCOUNTER — Telehealth: Payer: Self-pay | Admitting: Internal Medicine

## 2019-05-20 ENCOUNTER — Other Ambulatory Visit: Payer: Self-pay

## 2019-05-20 ENCOUNTER — Inpatient Hospital Stay (HOSPITAL_BASED_OUTPATIENT_CLINIC_OR_DEPARTMENT_OTHER): Payer: Medicare Other | Admitting: Internal Medicine

## 2019-05-20 ENCOUNTER — Inpatient Hospital Stay: Payer: Medicare Other

## 2019-05-20 ENCOUNTER — Inpatient Hospital Stay: Payer: Medicare Other | Attending: Internal Medicine

## 2019-05-20 ENCOUNTER — Encounter: Payer: Self-pay | Admitting: Internal Medicine

## 2019-05-20 VITALS — BP 111/81 | HR 88 | Temp 98.0°F | Resp 17 | Ht 63.0 in | Wt 125.2 lb

## 2019-05-20 DIAGNOSIS — C3491 Malignant neoplasm of unspecified part of right bronchus or lung: Secondary | ICD-10-CM

## 2019-05-20 DIAGNOSIS — M810 Age-related osteoporosis without current pathological fracture: Secondary | ICD-10-CM | POA: Insufficient documentation

## 2019-05-20 DIAGNOSIS — I639 Cerebral infarction, unspecified: Secondary | ICD-10-CM

## 2019-05-20 DIAGNOSIS — E785 Hyperlipidemia, unspecified: Secondary | ICD-10-CM | POA: Insufficient documentation

## 2019-05-20 DIAGNOSIS — Z85828 Personal history of other malignant neoplasm of skin: Secondary | ICD-10-CM | POA: Diagnosis not present

## 2019-05-20 DIAGNOSIS — G894 Chronic pain syndrome: Secondary | ICD-10-CM | POA: Diagnosis not present

## 2019-05-20 DIAGNOSIS — R5383 Other fatigue: Secondary | ICD-10-CM

## 2019-05-20 DIAGNOSIS — Z23 Encounter for immunization: Secondary | ICD-10-CM | POA: Insufficient documentation

## 2019-05-20 DIAGNOSIS — R05 Cough: Secondary | ICD-10-CM | POA: Diagnosis not present

## 2019-05-20 DIAGNOSIS — C3411 Malignant neoplasm of upper lobe, right bronchus or lung: Secondary | ICD-10-CM | POA: Diagnosis not present

## 2019-05-20 DIAGNOSIS — Z7982 Long term (current) use of aspirin: Secondary | ICD-10-CM | POA: Insufficient documentation

## 2019-05-20 DIAGNOSIS — C7889 Secondary malignant neoplasm of other digestive organs: Secondary | ICD-10-CM | POA: Diagnosis not present

## 2019-05-20 DIAGNOSIS — I10 Essential (primary) hypertension: Secondary | ICD-10-CM | POA: Diagnosis not present

## 2019-05-20 DIAGNOSIS — R197 Diarrhea, unspecified: Secondary | ICD-10-CM | POA: Insufficient documentation

## 2019-05-20 DIAGNOSIS — Z5112 Encounter for antineoplastic immunotherapy: Secondary | ICD-10-CM

## 2019-05-20 LAB — CBC WITH DIFFERENTIAL (CANCER CENTER ONLY)
Abs Immature Granulocytes: 0.03 10*3/uL (ref 0.00–0.07)
Basophils Absolute: 0 10*3/uL (ref 0.0–0.1)
Basophils Relative: 1 %
Eosinophils Absolute: 0.1 10*3/uL (ref 0.0–0.5)
Eosinophils Relative: 2 %
HCT: 35.8 % — ABNORMAL LOW (ref 36.0–46.0)
Hemoglobin: 12.3 g/dL (ref 12.0–15.0)
Immature Granulocytes: 0 %
Lymphocytes Relative: 21 %
Lymphs Abs: 1.9 10*3/uL (ref 0.7–4.0)
MCH: 38.6 pg — ABNORMAL HIGH (ref 26.0–34.0)
MCHC: 34.4 g/dL (ref 30.0–36.0)
MCV: 112.2 fL — ABNORMAL HIGH (ref 80.0–100.0)
Monocytes Absolute: 0.4 10*3/uL (ref 0.1–1.0)
Monocytes Relative: 5 %
Neutro Abs: 6.3 10*3/uL (ref 1.7–7.7)
Neutrophils Relative %: 71 %
Platelet Count: 260 10*3/uL (ref 150–400)
RBC: 3.19 MIL/uL — ABNORMAL LOW (ref 3.87–5.11)
RDW: 12.4 % (ref 11.5–15.5)
WBC Count: 8.8 10*3/uL (ref 4.0–10.5)
nRBC: 0 % (ref 0.0–0.2)

## 2019-05-20 LAB — CMP (CANCER CENTER ONLY)
ALT: 8 U/L (ref 0–44)
AST: 16 U/L (ref 15–41)
Albumin: 3.5 g/dL (ref 3.5–5.0)
Alkaline Phosphatase: 133 U/L — ABNORMAL HIGH (ref 38–126)
Anion gap: 10 (ref 5–15)
BUN: 5 mg/dL — ABNORMAL LOW (ref 6–20)
CO2: 28 mmol/L (ref 22–32)
Calcium: 9.3 mg/dL (ref 8.9–10.3)
Chloride: 104 mmol/L (ref 98–111)
Creatinine: 0.72 mg/dL (ref 0.44–1.00)
GFR, Est AFR Am: >60 mL/min (ref >60)
GFR, Estimated: >60 mL/min (ref >60)
Glucose, Bld: 109 mg/dL — ABNORMAL HIGH (ref 70–99)
Potassium: 3.1 mmol/L — ABNORMAL LOW (ref 3.5–5.1)
Sodium: 142 mmol/L (ref 135–145)
Total Bilirubin: 0.4 mg/dL (ref 0.3–1.2)
Total Protein: 6.8 g/dL (ref 6.5–8.1)

## 2019-05-20 LAB — TSH: TSH: 1.675 u[IU]/mL (ref 0.308–3.960)

## 2019-05-20 MED ORDER — SODIUM CHLORIDE 0.9 % IV SOLN
1200.0000 mg | Freq: Once | INTRAVENOUS | Status: AC
  Administered 2019-05-20: 12:00:00 1200 mg via INTRAVENOUS
  Filled 2019-05-20: qty 20

## 2019-05-20 MED ORDER — SODIUM CHLORIDE 0.9% FLUSH
10.0000 mL | INTRAVENOUS | Status: DC | PRN
  Administered 2019-05-20: 10 mL
  Filled 2019-05-20: qty 10

## 2019-05-20 MED ORDER — SODIUM CHLORIDE 0.9 % IV SOLN
Freq: Once | INTRAVENOUS | Status: AC
  Administered 2019-05-20: 11:00:00 via INTRAVENOUS
  Filled 2019-05-20: qty 250

## 2019-05-20 MED ORDER — INFLUENZA VAC SPLIT QUAD 0.5 ML IM SUSY
PREFILLED_SYRINGE | INTRAMUSCULAR | Status: AC
  Filled 2019-05-20: qty 0.5

## 2019-05-20 MED ORDER — HEPARIN SOD (PORK) LOCK FLUSH 100 UNIT/ML IV SOLN
500.0000 [IU] | Freq: Once | INTRAVENOUS | Status: AC | PRN
  Administered 2019-05-20: 500 [IU]
  Filled 2019-05-20: qty 5

## 2019-05-20 MED ORDER — SODIUM CHLORIDE 0.9% FLUSH
10.0000 mL | INTRAVENOUS | Status: DC | PRN
  Administered 2019-05-20: 10:00:00 10 mL
  Filled 2019-05-20: qty 10

## 2019-05-20 MED ORDER — INFLUENZA VAC SPLIT QUAD 0.5 ML IM SUSY
0.5000 mL | PREFILLED_SYRINGE | Freq: Once | INTRAMUSCULAR | Status: AC
  Administered 2019-05-20: 0.5 mL via INTRAMUSCULAR

## 2019-05-20 NOTE — Telephone Encounter (Signed)
Scheduled appt per 9/17 los - pt to get an updated schedule next visit.

## 2019-05-20 NOTE — Patient Instructions (Signed)
[facility] Discharge Instructions for Patients Receiving Chemotherapy  Today you received the following chemotherapy agents: Tecentriq  To help prevent nausea and vomiting after your treatment, we encourage you to take your nausea medication as directed.   If you develop nausea and vomiting that is not controlled by your nausea medication, call the clinic.   BELOW ARE SYMPTOMS THAT SHOULD BE REPORTED IMMEDIATELY:  *FEVER GREATER THAN 100.5 F  *CHILLS WITH OR WITHOUT FEVER  NAUSEA AND VOMITING THAT IS NOT CONTROLLED WITH YOUR NAUSEA MEDICATION  *UNUSUAL SHORTNESS OF BREATH  *UNUSUAL BRUISING OR BLEEDING  TENDERNESS IN MOUTH AND THROAT WITH OR WITHOUT PRESENCE OF ULCERS  *URINARY PROBLEMS  *BOWEL PROBLEMS  UNUSUAL RASH Items with * indicate a potential emergency and should be followed up as soon as possible.  Feel free to call the clinic should you have any questions or concerns. The clinic phone number is [phone].  Please show the CHEMO ALERT CARD at check-in to the Emergency Department and triage nurse.   

## 2019-05-20 NOTE — Progress Notes (Signed)
[facility] [phone]   [phone]) [phone]  OFFICE PROGRESS NOTE  [name], MD [address] [place] [address]  DIAGNOSIS: Extensive stage (T2a, N3, [name], large right anterior mediastinal and supraclavicular lymphadenopathy as well as pancreatic and splenic metastasis diagnosed in September 2017.  The patient had disease progression in October 2019.  PRIOR THERAPY:  1) Systemic chemotherapy was carboplatin for AUC of 5 on day 1 and etoposide 100 MG/M2 on days 1, 2 and 3 with Neulasta support. Status post 6 cycles with significant response of her disease. 2) Prophylactic cranial irradiation under the care of Dr. Sondra Come on 12/02/2016. 3) stereotactic radiotherapy to the recurrent right upper lobe pulmonary nodule under the care of Dr. Sondra Come completed November 10, 2017. 4) Retreatment with systemic chemotherapy with carboplatin for AUC of 5 on day 1 and etoposide 100 mg/M2 on days 1, 2 and 3 as well as Tecentriq (Atezolizumab) 1200 mg IV every 3 weeks with Neulasta support.  First dose June 22, 2018 for disease recurrence.  Status post 5 cycles.  Starting from cycle #2 her dose of carboplatin will be reduced to AUC of 4 and etoposide 80 mg/M2 on days 1, 2 and 3 in addition to the regular dose of Tecentriq.  CURRENT THERAPY: Maintenance treatment with single agent Tecentriq 1200 mg IV every 3 weeks.  Status post 10 cycles.  INTERVAL HISTORY: ALEESIA HENNEY 58 y.o. female returns to the clinic today for follow-up visit.  The patient is feeling fine today with no concerning complaints except for mild cough and few episodes of diarrhea.  She denied having any chest pain, shortness of breath or hemoptysis.  She denied having any nausea, vomiting or constipation.  She has no weight loss or night sweats.  She is here today for evaluation before starting cycle #11 of her maintenance treatment with  Tecentriq.  MEDICAL HISTORY: Past Medical History:  Diagnosis Date  . 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 Blood type, Rh positive   . Cancer ([place])   . Chronic pain syndrome   . Depression 08/07/2016  . Encounter for antineoplastic chemotherapy 07/17/2016  . History of external beam radiation therapy 11/19/16-12/02/16   brain 25 Gy in 10 fractions  . Hyperlipidemia   . Hypertension   . Hypertension 08/07/2016  . Osteoporosis   . Small cell lung cancer (HCC)     ALLERGIES:  is allergic to codeine; motrin [ibuprofen]; thiazide-type diuretics; and vicodin [hydrocodone-acetaminophen].  MEDICATIONS:  Current Outpatient Medications  Medication Sig Dispense Refill  . aspirin EC 81 MG tablet Take 1 tablet (81 mg total) by mouth daily. 150 tablet 2  . atorvastatin (LIPITOR) 80 MG tablet Take 1 tablet (80 mg total) by mouth daily at 6 PM. 30 tablet 0  . celecoxib (CELEBREX) 200 MG capsule Take 200 mg by mouth daily as needed for mild pain.     Marland Kitchen clopidogrel (PLAVIX) 75 MG tablet Take 1 tablet (75 mg total) by mouth daily. 30 tablet 0  . diazepam (VALIUM) 5 MG tablet Take 5 mg by mouth 3 (three) times daily as needed for anxiety.   2  . diphenhydrAMINE (BENADRYL) 25 MG tablet Take 25 mg by mouth every 6 (six) hours as needed for itching or allergies.     Marland Kitchen lidocaine-prilocaine (EMLA) cream Apply 1 application topically as needed. 30 g  0  . loperamide (IMODIUM A-D) 2 MG tablet Take 2 mg by mouth 4 (four) times daily as needed for diarrhea or loose stools.    . magnesium oxide (MAG-OX) 400 (241.3 Mg) MG tablet Take 1 tablet (400 mg total) by mouth daily. 30 tablet 0  . mirtazapine (REMERON) 15 MG tablet Take 1 tablet (15 mg total) by mouth at bedtime. 30 tablet 1  . oxyCODONE-acetaminophen (PERCOCET) 5-325 MG tablet Take 1 tablet by mouth every 6 (six) hours as needed for severe pain. 30 tablet 0  . polyethylene glycol (MIRALAX / GLYCOLAX) packet Take 17 g by mouth daily. 14 each 0   . potassium chloride 20 MEQ/15ML (10%) SOLN Take 30 mLs (40 mEq total) by mouth daily. 90 mL 0  . prochlorperazine (COMPAZINE) 10 MG tablet Take 1 tablet (10 mg total) by mouth every 6 (six) hours as needed for nausea or vomiting. 30 tablet 0  . senna-docusate (SENOKOT-S) 8.6-50 MG tablet Take 2 tablets by mouth at bedtime. 5 tablet 0  . traMADol (ULTRAM) 50 MG tablet Take 50 mg by mouth every 6 (six) hours as needed for moderate pain.     . Vitamin D, Ergocalciferol, (DRISDOL) 1.25 MG (50000 UT) CAPS capsule Take 1 capsule (50,000 Units total) by mouth every 7 (seven) days. 5 capsule 0   Current Facility-Administered Medications  Medication Dose Route Frequency Provider Last Rate Last Dose  . influenza vac split quadrivalent PF (FLUARIX) injection 0.5 mL  0.5 mL Intramuscular Once [name], MD        SURGICAL HISTORY:  Past Surgical History:  Procedure Laterality Date  . ABDOMINAL HYSTERECTOMY  1981  . APPENDECTOMY     at age 3  . EYE SURGERY     left  . IR GENERIC HISTORICAL  06/03/2016   IR FLUORO GUIDE PORT INSERTION RIGHT 06/03/2016 [facility]  . IR GENERIC HISTORICAL  06/03/2016   IR US GUIDE VASC ACCESS RIGHT 06/03/2016 [facility]  . SKIN CANCER EXCISION     basal cell carcinoma L side of face    REVIEW OF SYSTEMS:  A comprehensive review of systems was negative except for: Respiratory: positive for cough Gastrointestinal: positive for diarrhea   PHYSICAL EXAMINATION: General appearance: alert, cooperative and no distress Head: Normocephalic, without obvious abnormality, atraumatic Neck: no adenopathy, no JVD, supple, symmetrical, trachea midline and thyroid not enlarged, symmetric, no tenderness/mass/nodules Lymph nodes: Cervical, supraclavicular, and axillary nodes normal. Resp: clear to auscultation bilaterally Back: symmetric, no curvature. ROM normal. No CVA tenderness. Cardio: regular rate and rhythm, S1, S2 normal, no murmur, click, rub or gallop GI:  soft, non-tender; bowel sounds normal; no masses,  no organomegaly Extremities: extremities normal, atraumatic, no cyanosis or edema  ECOG PERFORMANCE STATUS: 1 - Symptomatic but completely ambulatory  Blood pressure 111/81, pulse 88, temperature 98 F (36.7 C), temperature source Temporal, resp. rate 17, height 5\' 3"  (1.6 m), weight 125 lb 3.2 oz (56.8 kg), SpO2 100 %.  LABORATORY DATA: Lab Results  Component Value Date   WBC 8.8 05/20/2019   HGB 12.3 05/20/2019   HCT 35.8 (L) 05/20/2019   MCV 112.2 (H) 05/20/2019   PLT 260 05/20/2019      Chemistry      Component Value Date/Time   NA 139 04/29/2019 1114   NA 141 08/01/2017 0835   K 3.5 04/29/2019 1114   K 3.5 08/01/2017 0835   CL 104 04/29/2019 1114   CO2 26 04/29/2019 1114   CO2 25  08/01/2017 0835   BUN <4 (L) 04/29/2019 1114   BUN 8.0 08/01/2017 0835   CREATININE 0.75 04/29/2019 1114   CREATININE 0.9 08/01/2017 0835      Component Value Date/Time   CALCIUM 8.8 (L) 04/29/2019 1114   CALCIUM 9.9 08/01/2017 0835   ALKPHOS 129 (H) 04/29/2019 1114   ALKPHOS 142 08/01/2017 0835   AST 13 (L) 04/29/2019 1114   AST 15 08/01/2017 0835   ALT 10 04/29/2019 1114   ALT 19 08/01/2017 0835   BILITOT 0.3 04/29/2019 1114   BILITOT 0.35 08/01/2017 0835       RADIOGRAPHIC STUDIES: No results found.  ASSESSMENT AND PLAN:  This is a very pleasant 58 years old white female with extensive stage small cell lung cancer status post systemic chemotherapy with carbo platinum and etoposide for 6 cycles and the patient rotated her treatment well except for chemotherapy-induced anemia and requirement for PRBCs and platelet transfusion. She had significant improvement in her disease with the chemotherapy. She also had prophylactic cranial irradiation. She also underwent stereotactic radiotherapy to progressive right upper lobe pulmonary nodule. The patient has been in observation for close to 2 years. Repeat CT scan of the chest, abdomen  and pelvis performed recently showed evidence for disease progression in the abdomen. The patient was started on systemic chemotherapy again with carboplatin, etoposide and Tecentriq status post 14 cycles.  Starting from cycle #5 the patient is treated with maintenance single agent Tecentriq. The patient continues to tolerate this treatment well with no concerning adverse effects. I recommended for her to proceed with cycle #11 of the maintenance therapy today. I will see her back for follow-up visit in 3 weeks for evaluation with repeat CT scan of the chest, abdomen pelvis for restaging of her disease. She was advised to call immediately if she has any concerning symptoms in the interval. She will receive her flu vaccine today. The patient voices understanding of current disease status and treatment options and is in agreement with the current care plan. All questions were answered. The patient knows to call the clinic with any problems, questions or concerns. We can certainly see the patient much sooner if necessary.  Disclaimer: This note was dictated with voice recognition software. Similar sounding words can inadvertently be transcribed and may not be corrected upon review.

## 2019-05-26 ENCOUNTER — Telehealth: Payer: Self-pay

## 2019-05-26 NOTE — Telephone Encounter (Signed)
Spoke with patient and she states that she has not had any symptoms since she was positive in May. Patient was retested in June and states she was negative. Patient is symptom free and feels fine.

## 2019-06-08 ENCOUNTER — Ambulatory Visit (HOSPITAL_COMMUNITY)
Admission: RE | Admit: 2019-06-08 | Discharge: 2019-06-08 | Disposition: A | Discharge status: Home / Self Care | Payer: Medicare Other | Source: Ambulatory Visit | Attending: Internal Medicine | Admitting: Internal Medicine

## 2019-06-08 ENCOUNTER — Other Ambulatory Visit: Payer: Self-pay

## 2019-06-08 ENCOUNTER — Encounter (HOSPITAL_COMMUNITY): Payer: Self-pay

## 2019-06-08 DIAGNOSIS — C3491 Malignant neoplasm of unspecified part of right bronchus or lung: Secondary | ICD-10-CM | POA: Insufficient documentation

## 2019-06-08 MED ORDER — IOHEXOL 300 MG/ML  SOLN
100.0000 mL | Freq: Once | INTRAMUSCULAR | Status: AC | PRN
  Administered 2019-06-08: 13:00:00 100 mL via INTRAVENOUS

## 2019-06-08 MED ORDER — HEPARIN SOD (PORK) LOCK FLUSH 100 UNIT/ML IV SOLN
INTRAVENOUS | Status: AC
  Administered 2019-06-08: 13:00:00 500 [IU] via INTRAVENOUS
  Filled 2019-06-08: qty 5

## 2019-06-08 MED ORDER — SODIUM CHLORIDE (PF) 0.9 % IJ SOLN
INTRAMUSCULAR | Status: AC
  Filled 2019-06-08: qty 50

## 2019-06-10 ENCOUNTER — Inpatient Hospital Stay: Payer: Medicare Other

## 2019-06-10 ENCOUNTER — Inpatient Hospital Stay (HOSPITAL_BASED_OUTPATIENT_CLINIC_OR_DEPARTMENT_OTHER): Payer: Medicare Other | Admitting: Internal Medicine

## 2019-06-10 ENCOUNTER — Encounter: Payer: Self-pay | Admitting: Internal Medicine

## 2019-06-10 ENCOUNTER — Other Ambulatory Visit: Payer: Self-pay

## 2019-06-10 ENCOUNTER — Inpatient Hospital Stay: Payer: Medicare Other | Attending: Internal Medicine

## 2019-06-10 VITALS — BP 119/87 | HR 89 | Temp 98.9°F | Resp 16 | Ht 63.0 in | Wt 123.3 lb

## 2019-06-10 DIAGNOSIS — R5383 Other fatigue: Secondary | ICD-10-CM

## 2019-06-10 DIAGNOSIS — Z85828 Personal history of other malignant neoplasm of skin: Secondary | ICD-10-CM | POA: Diagnosis not present

## 2019-06-10 DIAGNOSIS — I10 Essential (primary) hypertension: Secondary | ICD-10-CM | POA: Insufficient documentation

## 2019-06-10 DIAGNOSIS — F329 Major depressive disorder, single episode, unspecified: Secondary | ICD-10-CM | POA: Diagnosis not present

## 2019-06-10 DIAGNOSIS — E785 Hyperlipidemia, unspecified: Secondary | ICD-10-CM | POA: Insufficient documentation

## 2019-06-10 DIAGNOSIS — G894 Chronic pain syndrome: Secondary | ICD-10-CM | POA: Diagnosis not present

## 2019-06-10 DIAGNOSIS — C3491 Malignant neoplasm of unspecified part of right bronchus or lung: Secondary | ICD-10-CM

## 2019-06-10 DIAGNOSIS — C3411 Malignant neoplasm of upper lobe, right bronchus or lung: Secondary | ICD-10-CM | POA: Insufficient documentation

## 2019-06-10 DIAGNOSIS — M810 Age-related osteoporosis without current pathological fracture: Secondary | ICD-10-CM | POA: Diagnosis not present

## 2019-06-10 DIAGNOSIS — Z79899 Other long term (current) drug therapy: Secondary | ICD-10-CM | POA: Insufficient documentation

## 2019-06-10 DIAGNOSIS — I639 Cerebral infarction, unspecified: Secondary | ICD-10-CM | POA: Diagnosis not present

## 2019-06-10 DIAGNOSIS — Z5112 Encounter for antineoplastic immunotherapy: Secondary | ICD-10-CM

## 2019-06-10 DIAGNOSIS — Z7982 Long term (current) use of aspirin: Secondary | ICD-10-CM | POA: Insufficient documentation

## 2019-06-10 LAB — CMP (CANCER CENTER ONLY)
ALT: 12 U/L (ref 0–44)
AST: 16 U/L (ref 15–41)
Albumin: 3.5 g/dL (ref 3.5–5.0)
Alkaline Phosphatase: 139 U/L — ABNORMAL HIGH (ref 38–126)
Anion gap: 11 (ref 5–15)
BUN: <4 mg/dL — ABNORMAL LOW (ref 6–20)
CO2: 27 mmol/L (ref 22–32)
Calcium: 9.2 mg/dL (ref 8.9–10.3)
Chloride: 103 mmol/L (ref 98–111)
Creatinine: 0.74 mg/dL (ref 0.44–1.00)
GFR, Est AFR Am: >60 mL/min (ref >60)
GFR, Estimated: >60 mL/min (ref >60)
Glucose, Bld: 104 mg/dL — ABNORMAL HIGH (ref 70–99)
Potassium: 3.3 mmol/L — ABNORMAL LOW (ref 3.5–5.1)
Sodium: 141 mmol/L (ref 135–145)
Total Bilirubin: 0.4 mg/dL (ref 0.3–1.2)
Total Protein: 7 g/dL (ref 6.5–8.1)

## 2019-06-10 LAB — CBC WITH DIFFERENTIAL (CANCER CENTER ONLY)
Abs Immature Granulocytes: 0.04 10*3/uL (ref 0.00–0.07)
Basophils Absolute: 0 10*3/uL (ref 0.0–0.1)
Basophils Relative: 0 %
Eosinophils Absolute: 0.2 10*3/uL (ref 0.0–0.5)
Eosinophils Relative: 2 %
HCT: 37.3 % (ref 36.0–46.0)
Hemoglobin: 12.7 g/dL (ref 12.0–15.0)
Immature Granulocytes: 0 %
Lymphocytes Relative: 15 %
Lymphs Abs: 1.5 10*3/uL (ref 0.7–4.0)
MCH: 37.9 pg — ABNORMAL HIGH (ref 26.0–34.0)
MCHC: 34 g/dL (ref 30.0–36.0)
MCV: 111.3 fL — ABNORMAL HIGH (ref 80.0–100.0)
Monocytes Absolute: 0.5 10*3/uL (ref 0.1–1.0)
Monocytes Relative: 5 %
Neutro Abs: 7.5 10*3/uL (ref 1.7–7.7)
Neutrophils Relative %: 78 %
Platelet Count: 266 10*3/uL (ref 150–400)
RBC: 3.35 MIL/uL — ABNORMAL LOW (ref 3.87–5.11)
RDW: 12.1 % (ref 11.5–15.5)
WBC Count: 9.7 10*3/uL (ref 4.0–10.5)
nRBC: 0 % (ref 0.0–0.2)

## 2019-06-10 LAB — TSH: TSH: 1.622 u[IU]/mL (ref 0.308–3.960)

## 2019-06-10 MED ORDER — SODIUM CHLORIDE 0.9 % IV SOLN
1200.0000 mg | Freq: Once | INTRAVENOUS | Status: AC
  Administered 2019-06-10: 1200 mg via INTRAVENOUS
  Filled 2019-06-10: qty 20

## 2019-06-10 MED ORDER — SODIUM CHLORIDE 0.9% FLUSH
10.0000 mL | INTRAVENOUS | Status: DC | PRN
  Administered 2019-06-10: 10 mL
  Filled 2019-06-10: qty 10

## 2019-06-10 MED ORDER — HEPARIN SOD (PORK) LOCK FLUSH 100 UNIT/ML IV SOLN
500.0000 [IU] | Freq: Once | INTRAVENOUS | Status: AC | PRN
  Administered 2019-06-10: 500 [IU]
  Filled 2019-06-10: qty 5

## 2019-06-10 MED ORDER — SODIUM CHLORIDE 0.9 % IV SOLN
Freq: Once | INTRAVENOUS | Status: AC
  Administered 2019-06-10: 11:00:00 via INTRAVENOUS
  Filled 2019-06-10: qty 250

## 2019-06-10 MED ORDER — SODIUM CHLORIDE 0.9% FLUSH
10.0000 mL | INTRAVENOUS | Status: DC | PRN
  Administered 2019-06-10: 10:00:00 10 mL
  Filled 2019-06-10: qty 10

## 2019-06-10 NOTE — Patient Instructions (Signed)
[facility] Discharge Instructions for Patients Receiving Chemotherapy  Today you received the following chemotherapy agents: Tecentriq  To help prevent nausea and vomiting after your treatment, we encourage you to take your nausea medication as directed.   If you develop nausea and vomiting that is not controlled by your nausea medication, call the clinic.   BELOW ARE SYMPTOMS THAT SHOULD BE REPORTED IMMEDIATELY:  *FEVER GREATER THAN 100.5 F  *CHILLS WITH OR WITHOUT FEVER  NAUSEA AND VOMITING THAT IS NOT CONTROLLED WITH YOUR NAUSEA MEDICATION  *UNUSUAL SHORTNESS OF BREATH  *UNUSUAL BRUISING OR BLEEDING  TENDERNESS IN MOUTH AND THROAT WITH OR WITHOUT PRESENCE OF ULCERS  *URINARY PROBLEMS  *BOWEL PROBLEMS  UNUSUAL RASH Items with * indicate a potential emergency and should be followed up as soon as possible.  Feel free to call the clinic should you have any questions or concerns. The clinic phone number is [phone].  Please show the CHEMO ALERT CARD at check-in to the Emergency Department and triage nurse.   

## 2019-06-10 NOTE — Progress Notes (Signed)
[facility] [phone]   [phone]  OFFICE PROGRESS NOTE  [name], MD [address] [place] [address]  DIAGNOSIS: Extensive stage (T2a, N3, [name], large right anterior mediastinal and supraclavicular lymphadenopathy as well as pancreatic and splenic metastasis diagnosed in September 2017.  The patient had disease progression in October 2019.  PRIOR THERAPY:  1) Systemic chemotherapy was carboplatin for AUC of 5 on day 1 and etoposide 100 MG/M2 on days 1, 2 and 3 with Neulasta support. Status post 6 cycles with significant response of her disease. 2) Prophylactic cranial irradiation under the care of Dr. Sondra Come on 12/02/2016. 3) stereotactic radiotherapy to the recurrent right upper lobe pulmonary nodule under the care of Dr. Sondra Come completed November 10, 2017. 4) Retreatment with systemic chemotherapy with carboplatin for AUC of 5 on day 1 and etoposide 100 mg/M2 on days 1, 2 and 3 as well as Tecentriq (Atezolizumab) 1200 mg IV every 3 weeks with Neulasta support.  First dose June 22, 2018 for disease recurrence.  Status post 5 cycles.  Starting from cycle #2 her dose of carboplatin will be reduced to AUC of 4 and etoposide 80 mg/M2 on days 1, 2 and 3 in addition to the regular dose of Tecentriq.  CURRENT THERAPY: Maintenance treatment with single agent Tecentriq 1200 mg IV every 3 weeks.  Status post 11 cycles.  INTERVAL HISTORY: Debra Kidd 58 y.o. female returns to the clinic today for follow-up visit.  The patient is feeling fine today with no concerning complaints except for 1 spot of skin rash on the left hand likely secondary to her treatment with immunotherapy.  The patient denied having any current chest pain, shortness of breath, cough or hemoptysis.  She denied having any nausea, vomiting, diarrhea or constipation.  She has no headache or visual changes.  She has no  recent weight loss or night sweats.  She has been tolerating her treatment with maintenance Tecentriq fairly well.  The patient is here today for evaluation before starting cycle #12 of her treatment with repeat CT scan of the chest, abdomen pelvis for restaging of her disease.  MEDICAL HISTORY: Past Medical History:  Diagnosis Date   Basal cell carcinoma of cheek    L side of face   Blood type, Rh positive    Cancer (HCC)    Chronic pain syndrome    Depression 08/07/2016   Encounter for antineoplastic chemotherapy 07/17/2016   History of external beam radiation therapy 11/19/16-12/02/16   brain 25 Gy in 10 fractions   Hyperlipidemia    Hypertension    Hypertension 08/07/2016   Osteoporosis    Small cell lung cancer ([place]) dx'd 05/2016    ALLERGIES:  is allergic to codeine; motrin [ibuprofen]; thiazide-type diuretics; and vicodin [hydrocodone-acetaminophen].  MEDICATIONS:  Current Outpatient Medications  Medication Sig Dispense Refill   aspirin EC 81 MG tablet Take 1 tablet (81 mg total) by mouth daily. 150 tablet 2   atorvastatin (LIPITOR) 80 MG tablet Take 1 tablet (80 mg total) by mouth daily at 6 PM. 30 tablet 0   celecoxib (CELEBREX) 200 MG capsule Take 200 mg by mouth daily as needed for mild pain.      clopidogrel (PLAVIX) 75 MG tablet Take 1 tablet (75 mg total) by mouth daily. 30 tablet 0   diazepam (VALIUM) 5 MG tablet Take 5 mg by mouth 3 (three)  times daily as needed for anxiety.   2   diphenhydrAMINE (BENADRYL) 25 MG tablet Take 25 mg by mouth every 6 (six) hours as needed for itching or allergies.      lidocaine-prilocaine (EMLA) cream Apply 1 application topically as needed. 30 g 0   loperamide (IMODIUM A-D) 2 MG tablet Take 2 mg by mouth 4 (four) times daily as needed for diarrhea or loose stools.     magnesium oxide (MAG-OX) 400 (241.3 Mg) MG tablet Take 1 tablet (400 mg total) by mouth daily. 30 tablet 0   mirtazapine (REMERON) 15 MG tablet Take 1  tablet (15 mg total) by mouth at bedtime. 30 tablet 1   oxyCODONE-acetaminophen (PERCOCET) 5-325 MG tablet Take 1 tablet by mouth every 6 (six) hours as needed for severe pain. 30 tablet 0   polyethylene glycol (MIRALAX / GLYCOLAX) packet Take 17 g by mouth daily. 14 each 0   potassium chloride 20 MEQ/15ML (10%) SOLN Take 30 mLs (40 mEq total) by mouth daily. 90 mL 0   prochlorperazine (COMPAZINE) 10 MG tablet Take 1 tablet (10 mg total) by mouth every 6 (six) hours as needed for nausea or vomiting. 30 tablet 0   senna-docusate (SENOKOT-S) 8.6-50 MG tablet Take 2 tablets by mouth at bedtime. 5 tablet 0   traMADol (ULTRAM) 50 MG tablet Take 50 mg by mouth every 6 (six) hours as needed for moderate pain.      Vitamin D, Ergocalciferol, (DRISDOL) 1.25 MG (50000 UT) CAPS capsule Take 1 capsule (50,000 Units total) by mouth every 7 (seven) days. 5 capsule 0   No current facility-administered medications for this visit.     SURGICAL HISTORY:  Past Surgical History:  Procedure Laterality Date   ABDOMINAL HYSTERECTOMY  1981   APPENDECTOMY     at age 45   EYE SURGERY     left   IR GENERIC HISTORICAL  06/03/2016   IR FLUORO GUIDE PORT INSERTION RIGHT 06/03/2016 [facility]   IR GENERIC HISTORICAL  06/03/2016   IR US GUIDE VASC ACCESS RIGHT 06/03/2016 [facility]   SKIN CANCER EXCISION     basal cell carcinoma L side of face    REVIEW OF SYSTEMS:  Constitutional: negative Eyes: negative Ears, nose, mouth, throat, and face: negative Respiratory: negative Cardiovascular: negative Gastrointestinal: negative Genitourinary:negative Integument/breast: positive for rash Hematologic/lymphatic: negative Musculoskeletal:negative Neurological: negative Behavioral/Psych: negative Endocrine: negative Allergic/Immunologic: negative   PHYSICAL EXAMINATION: General appearance: alert, cooperative and no distress Head: Normocephalic, without obvious abnormality, atraumatic Neck: no  adenopathy, no JVD, supple, symmetrical, trachea midline and thyroid not enlarged, symmetric, no tenderness/mass/nodules Lymph nodes: Cervical, supraclavicular, and axillary nodes normal. Resp: clear to auscultation bilaterally Back: symmetric, no curvature. ROM normal. No CVA tenderness. Cardio: regular rate and rhythm, S1, S2 normal, no murmur, click, rub or gallop GI: soft, non-tender; bowel sounds normal; no masses,  no organomegaly Extremities: extremities normal, atraumatic, no cyanosis or edema Neurologic: Alert and oriented X 3, normal strength and tone. Normal symmetric reflexes. Normal coordination and gait  ECOG PERFORMANCE STATUS: 1 - Symptomatic but completely ambulatory  Blood pressure 119/87, pulse 89, temperature 98.9 F (37.2 C), temperature source Temporal, resp. rate 16, height 5\' 3"  (1.6 m), weight 123 lb 4.8 oz (55.9 kg), SpO2 99 %.  LABORATORY DATA: Lab Results  Component Value Date   WBC 9.7 06/10/2019   HGB 12.7 06/10/2019   HCT 37.3 06/10/2019   MCV 111.3 (H) 06/10/2019   PLT 266 06/10/2019  Chemistry      Component Value Date/Time   NA 142 05/20/2019 0920   NA 141 08/01/2017 0835   K 3.1 (L) 05/20/2019 0920   K 3.5 08/01/2017 0835   CL 104 05/20/2019 0920   CO2 28 05/20/2019 0920   CO2 25 08/01/2017 0835   BUN 5 (L) 05/20/2019 0920   BUN 8.0 08/01/2017 0835   CREATININE 0.72 05/20/2019 0920   CREATININE 0.9 08/01/2017 0835      Component Value Date/Time   CALCIUM 9.3 05/20/2019 0920   CALCIUM 9.9 08/01/2017 0835   ALKPHOS 133 (H) 05/20/2019 0920   ALKPHOS 142 08/01/2017 0835   AST 16 05/20/2019 0920   AST 15 08/01/2017 0835   ALT 8 05/20/2019 0920   ALT 19 08/01/2017 0835   BILITOT 0.4 05/20/2019 0920   BILITOT 0.35 08/01/2017 0835       RADIOGRAPHIC STUDIES: Ct Chest W Contrast  Result Date: 06/08/2019 CLINICAL DATA:  Small cell lung cancer restaging. EXAM: CT CHEST, ABDOMEN, AND PELVIS WITH CONTRAST TECHNIQUE: Multidetector CT  imaging of the chest, abdomen and pelvis was performed following the standard protocol during bolus administration of intravenous contrast. CONTRAST:  122mL OMNIPAQUE IOHEXOL 300 MG/ML  SOLN COMPARISON:  Multiple exams, including 03/16/2019 FINDINGS: CT CHEST FINDINGS Cardiovascular: Right Port-A-Cath tip: Cavoatrial junction. Coronary, aortic arch, and branch vessel atherosclerotic vascular disease. Severe stenosis of the proximal segment of the left common carotid artery. Mediastinum/Nodes: Right hilar lymph node measures 1.0 cm in short axis on image 27/2, stable. Stable minimal right paratracheal stranding without overt paratracheal adenopathy. Lungs/Pleura: The peripheral right airspace opacity in the apically segment measures 3.2 by 1.7 cm, previously 3.2 by 2.5 cm. Adjacent interstitial accentuation probably from radiation therapy. No underlying well-defined nodule is readily apparent. There is plugging of airways extending into this process. Musculoskeletal: Similar appearance of compression fractures of T6, T7, and T8. This numbering assumes that the top most rib-bearing level is T1; there appear to be only 4 lumbar type vertebra, with only small ribs at the T12 level. CT ABDOMEN PELVIS FINDINGS Hepatobiliary: Stable hypodensity anteriorly in segment 4 adjacent to the falciform ligament, likely focal fatty infiltration. Gallbladder unremarkable. Pancreas: Unremarkable Spleen: Unremarkable Adrenals/Urinary Tract: Unremarkable Stomach/Bowel: Prominent stool throughout the colon favors constipation. Vascular/Lymphatic: Aortoiliac atherosclerotic vascular disease. Reproductive: Uterus absent.  Adnexa unremarkable. Other: Stable stranding in the central mesentery. Musculoskeletal: Chronic superior endplate compression at the L2 vertebral level. IMPRESSION: 1. Mild reduction in size of the peripheral right apical airspace opacity compared to the prior exam. 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 The  previous pulmonary nodule in this region is not clearly seen. 2. Borderline enlarged right hilar lymph node, stable. 3. Stable mild stranding of the mesenteric adipose tissue ("misty mesentery") is present. This can be idiopathic, due to adjacent inflammation such as enteritis, from mild sclerosing mesenteritis, or rarely from other conditions such as cirrhosis, mesenteric lymphoma, or mesenteric venous thrombosis. 4. Other imaging findings of potential clinical significance: Aortic Atherosclerosis (ICD10-I70.0). Coronary atherosclerosis. Prominent stenosis of the proximal segment of the left common carotid artery. Remote compression fractures at T6, T7, T8, and L1 (note, there are only 4 lumbar type non-rib-bearing vertebra). Prominent stool throughout the colon favors constipation. Electronically Signed   By: Van Clines M.D.   On: 06/08/2019 13:43   Ct Abdomen Pelvis W Contrast  Result Date: 06/08/2019 CLINICAL DATA:  Small cell lung cancer restaging. EXAM: CT CHEST, ABDOMEN, AND PELVIS WITH CONTRAST TECHNIQUE: Multidetector CT  imaging of the chest, abdomen and pelvis was performed following the standard protocol during bolus administration of intravenous contrast. CONTRAST:  113mL OMNIPAQUE IOHEXOL 300 MG/ML  SOLN COMPARISON:  Multiple exams, including 03/16/2019 FINDINGS: CT CHEST FINDINGS Cardiovascular: Right Port-A-Cath tip: Cavoatrial junction. Coronary, aortic arch, and branch vessel atherosclerotic vascular disease. Severe stenosis of the proximal segment of the left common carotid artery. Mediastinum/Nodes: Right hilar lymph node measures 1.0 cm in short axis on image 27/2, stable. Stable minimal right paratracheal stranding without overt paratracheal adenopathy. Lungs/Pleura: The peripheral right airspace opacity in the apically segment measures 3.2 by 1.7 cm, previously 3.2 by 2.5 cm. Adjacent interstitial accentuation probably from radiation therapy. No underlying well-defined nodule is  readily apparent. There is plugging of airways extending into this process. Musculoskeletal: Similar appearance of compression fractures of T6, T7, and T8. This numbering assumes that the top most rib-bearing level is T1; there appear to be only 4 lumbar type vertebra, with only small ribs at the T12 level. CT ABDOMEN PELVIS FINDINGS Hepatobiliary: Stable hypodensity anteriorly in segment 4 adjacent to the falciform ligament, likely focal fatty infiltration. Gallbladder unremarkable. Pancreas: Unremarkable Spleen: Unremarkable Adrenals/Urinary Tract: Unremarkable Stomach/Bowel: Prominent stool throughout the colon favors constipation. Vascular/Lymphatic: Aortoiliac atherosclerotic vascular disease. Reproductive: Uterus absent.  Adnexa unremarkable. Other: Stable stranding in the central mesentery. Musculoskeletal: Chronic superior endplate compression at the L2 vertebral level. IMPRESSION: 1. Mild reduction in size of the peripheral right apical airspace opacity compared to the prior exam. 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 The previous pulmonary nodule in this region is not clearly seen. 2. Borderline enlarged right hilar lymph node, stable. 3. Stable mild stranding of the mesenteric adipose tissue ("misty mesentery") is present. This can be idiopathic, due to adjacent inflammation such as enteritis, from mild sclerosing mesenteritis, or rarely from other conditions such as cirrhosis, mesenteric lymphoma, or mesenteric venous thrombosis. 4. Other imaging findings of potential clinical significance: Aortic Atherosclerosis (ICD10-I70.0). Coronary atherosclerosis. Prominent stenosis of the proximal segment of the left common carotid artery. Remote compression fractures at T6, T7, T8, and L1 (note, there are only 4 lumbar type non-rib-bearing vertebra). Prominent stool throughout the colon favors constipation. Electronically Signed   By: Van Clines M.D.   On: 06/08/2019 13:43    ASSESSMENT  AND PLAN:  This is a very pleasant 58 years old white female with extensive stage small cell lung cancer status post systemic chemotherapy with carbo platinum and etoposide for 6 cycles and the patient rotated her treatment well except for chemotherapy-induced anemia and requirement for PRBCs and platelet transfusion. She had significant improvement in her disease with the chemotherapy. She also had prophylactic cranial irradiation. She also underwent stereotactic radiotherapy to progressive right upper lobe pulmonary nodule. The patient has been in observation for close to 2 years. Repeat CT scan of the chest, abdomen and pelvis performed recently showed evidence for disease progression in the abdomen. The patient was started on systemic chemotherapy again with carboplatin, etoposide and Tecentriq status post 15 cycles.  Starting from cycle #5 the patient is treated with maintenance single agent Tecentriq. The patient has been tolerating her maintenance treatment fairly well with no concerning adverse effects. She had repeat CT scan of the chest, abdomen pelvis performed recently.  I personally and independently reviewed the scans and discussed the results with the patient today. Her scan showed no concerning findings for disease progression. I recommended for the patient to continue her current treatment with maintenance Tecentriq and she will  proceed with cycle #16 today. She will come back for follow-up visit in 3 weeks for evaluation before the next cycle of her treatment. For the skin rash, she will apply hydrocortisone cream to the area of this skin rash and will monitor it closely. The patient was advised to call immediately if she has any concerning symptoms in the interval. The patient voices understanding of current disease status and treatment options and is in agreement with the current care plan. All questions were answered. The patient knows to call the clinic with any problems,  questions or concerns. We can certainly see the patient much sooner if necessary.  Disclaimer: This note was dictated with voice recognition software. Similar sounding words can inadvertently be transcribed and may not be corrected upon review.

## 2019-07-01 ENCOUNTER — Inpatient Hospital Stay: Payer: Medicare Other

## 2019-07-01 ENCOUNTER — Inpatient Hospital Stay (HOSPITAL_BASED_OUTPATIENT_CLINIC_OR_DEPARTMENT_OTHER): Payer: Medicare Other | Admitting: Physician Assistant

## 2019-07-01 ENCOUNTER — Other Ambulatory Visit: Payer: Self-pay | Admitting: Medical Oncology

## 2019-07-01 ENCOUNTER — Other Ambulatory Visit: Payer: Self-pay

## 2019-07-01 VITALS — BP 110/85 | HR 89 | Temp 98.7°F | Resp 18 | Ht 63.0 in | Wt 122.3 lb

## 2019-07-01 DIAGNOSIS — R5383 Other fatigue: Secondary | ICD-10-CM

## 2019-07-01 DIAGNOSIS — Z5112 Encounter for antineoplastic immunotherapy: Secondary | ICD-10-CM | POA: Diagnosis not present

## 2019-07-01 DIAGNOSIS — C3491 Malignant neoplasm of unspecified part of right bronchus or lung: Secondary | ICD-10-CM

## 2019-07-01 LAB — CMP (CANCER CENTER ONLY)
ALT: 21 U/L (ref 0–44)
AST: 21 U/L (ref 15–41)
Albumin: 3.4 g/dL — ABNORMAL LOW (ref 3.5–5.0)
Alkaline Phosphatase: 138 U/L — ABNORMAL HIGH (ref 38–126)
Anion gap: 11 (ref 5–15)
BUN: 4 mg/dL — ABNORMAL LOW (ref 6–20)
CO2: 27 mmol/L (ref 22–32)
Calcium: 9.3 mg/dL (ref 8.9–10.3)
Chloride: 103 mmol/L (ref 98–111)
Creatinine: 0.7 mg/dL (ref 0.44–1.00)
GFR, Est AFR Am: >60 mL/min (ref >60)
GFR, Estimated: >60 mL/min (ref >60)
Glucose, Bld: 100 mg/dL — ABNORMAL HIGH (ref 70–99)
Potassium: 3.3 mmol/L — ABNORMAL LOW (ref 3.5–5.1)
Sodium: 141 mmol/L (ref 135–145)
Total Bilirubin: 0.3 mg/dL (ref 0.3–1.2)
Total Protein: 6.8 g/dL (ref 6.5–8.1)

## 2019-07-01 LAB — CBC WITH DIFFERENTIAL (CANCER CENTER ONLY)
Abs Immature Granulocytes: 0.02 10*3/uL (ref 0.00–0.07)
Basophils Absolute: 0 10*3/uL (ref 0.0–0.1)
Basophils Relative: 0 %
Eosinophils Absolute: 0.2 10*3/uL (ref 0.0–0.5)
Eosinophils Relative: 2 %
HCT: 35.9 % — ABNORMAL LOW (ref 36.0–46.0)
Hemoglobin: 12.4 g/dL (ref 12.0–15.0)
Immature Granulocytes: 0 %
Lymphocytes Relative: 18 %
Lymphs Abs: 1.8 10*3/uL (ref 0.7–4.0)
MCH: 37.5 pg — ABNORMAL HIGH (ref 26.0–34.0)
MCHC: 34.5 g/dL (ref 30.0–36.0)
MCV: 108.5 fL — ABNORMAL HIGH (ref 80.0–100.0)
Monocytes Absolute: 0.5 10*3/uL (ref 0.1–1.0)
Monocytes Relative: 6 %
Neutro Abs: 7.1 10*3/uL (ref 1.7–7.7)
Neutrophils Relative %: 74 %
Platelet Count: 272 10*3/uL (ref 150–400)
RBC: 3.31 MIL/uL — ABNORMAL LOW (ref 3.87–5.11)
RDW: 11.8 % (ref 11.5–15.5)
WBC Count: 9.6 10*3/uL (ref 4.0–10.5)
nRBC: 0 % (ref 0.0–0.2)

## 2019-07-01 LAB — TSH: TSH: 1.556 u[IU]/mL (ref 0.308–3.960)

## 2019-07-01 MED ORDER — SODIUM CHLORIDE 0.9 % IV SOLN
1200.0000 mg | Freq: Once | INTRAVENOUS | Status: AC
  Administered 2019-07-01: 12:00:00 1200 mg via INTRAVENOUS
  Filled 2019-07-01: qty 20

## 2019-07-01 MED ORDER — SODIUM CHLORIDE 0.9 % IV SOLN
Freq: Once | INTRAVENOUS | Status: AC
  Administered 2019-07-01: 11:00:00 via INTRAVENOUS
  Filled 2019-07-01: qty 250

## 2019-07-01 MED ORDER — SODIUM CHLORIDE 0.9% FLUSH
10.0000 mL | INTRAVENOUS | Status: DC | PRN
  Administered 2019-07-01: 10 mL
  Filled 2019-07-01: qty 10

## 2019-07-01 MED ORDER — HEPARIN SOD (PORK) LOCK FLUSH 100 UNIT/ML IV SOLN
500.0000 [IU] | Freq: Once | INTRAVENOUS | Status: AC | PRN
  Administered 2019-07-01: 500 [IU]
  Filled 2019-07-01: qty 5

## 2019-07-01 MED ORDER — SODIUM CHLORIDE 0.9% FLUSH
10.0000 mL | INTRAVENOUS | Status: DC | PRN
  Administered 2019-07-01: 13:00:00 10 mL
  Filled 2019-07-01: qty 10

## 2019-07-01 NOTE — Patient Instructions (Signed)
[facility] Discharge Instructions for Patients Receiving Chemotherapy  Today you received the following chemotherapy agents: Tecentriq  To help prevent nausea and vomiting after your treatment, we encourage you to take your nausea medication as directed.   If you develop nausea and vomiting that is not controlled by your nausea medication, call the clinic.   BELOW ARE SYMPTOMS THAT SHOULD BE REPORTED IMMEDIATELY:  *FEVER GREATER THAN 100.5 F  *CHILLS WITH OR WITHOUT FEVER  NAUSEA AND VOMITING THAT IS NOT CONTROLLED WITH YOUR NAUSEA MEDICATION  *UNUSUAL SHORTNESS OF BREATH  *UNUSUAL BRUISING OR BLEEDING  TENDERNESS IN MOUTH AND THROAT WITH OR WITHOUT PRESENCE OF ULCERS  *URINARY PROBLEMS  *BOWEL PROBLEMS  UNUSUAL RASH Items with * indicate a potential emergency and should be followed up as soon as possible.  Feel free to call the clinic should you have any questions or concerns. The clinic phone number is [phone].  Please show the CHEMO ALERT CARD at check-in to the Emergency Department and triage nurse.   

## 2019-07-01 NOTE — Progress Notes (Signed)
[place] OFFICE PROGRESS NOTE  [name], MD [address] [place] [address]  DIAGNOSIS: Extensive stage (T2a, N3, [name], large right anterior mediastinal and supraclavicular lymphadenopathy as well as pancreatic and splenic metastasis diagnosed in September 2017. The patient had disease progression in October 2019.  PRIOR THERAPY:  1) Systemic chemotherapy was carboplatin for AUC of 5 on day 1 and etoposide 100 MG/M2 on days 1, 2 and 3 with Neulasta support. Status post 6 cycles with significant response of her disease. 2) Prophylactic cranial irradiation under the care of Dr. Sondra Come on 12/02/2016. 3) stereotactic radiotherapy to the recurrent right upper lobe pulmonary nodule under the care of Dr. Sondra Come completed November 10, 2017. 4)Retreatment with systemic chemotherapy with carboplatin for AUC of 5 on day 1 and etoposide 100 mg/M2 on days 1, 2 and 3 as well as Tecentriq (Atezolizumab) 1200 mg IV every 3 weeks with Neulasta support. First dose June 22, 2018 for disease recurrence. Status post 5 cycles. Starting from cycle #2 her dose of carboplatin will be reduced to AUC of 4 and etoposide 80 mg/M2 on days 1, 2 and 3 in addition to the regular dose of Tecentriq  CURRENT THERAPY: Maintenance treatment with single agent Tecentriq 1200 mg IV every 3 weeks. Status post12cycles.  INTERVAL HISTORY: Debra Kidd 58 y.o. female returns to the clinic for a follow up visit. The patient is feeling well today without any concerning complaints except for fatigue. The patient continues to tolerate treatment with tecentriq well without any adverse effects except for occasional dry red skin lesions which resolve without any intervention. They are non-tender and are mildly pruritic. She has one on her left forearm. Denies any fever, chills, night sweats, or weight loss. Denies any chest pain, shortness of breath,  cough, or hemoptysis. Denies any nausea, vomiting, diarrhea, or constipation. Denies any headache or visual changes. The patient is here today for evaluation prior to starting cycle # 13  MEDICAL HISTORY: Past Medical History:  Diagnosis Date  . 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 Blood type, Rh positive   . Cancer ([facility])   . Chronic pain syndrome   . Depression 08/07/2016  . Encounter for antineoplastic chemotherapy 07/17/2016  . History of external beam radiation therapy 11/19/16-12/02/16   brain 25 Gy in 10 fractions  . Hyperlipidemia   . Hypertension   . Hypertension 08/07/2016  . Osteoporosis   . Small cell lung cancer ([facility]) dx'd 05/2016    ALLERGIES:  is allergic to codeine; motrin [ibuprofen]; thiazide-type diuretics; and vicodin [hydrocodone-acetaminophen].  MEDICATIONS:  Current Outpatient Medications  Medication Sig Dispense Refill  . aspirin EC 81 MG tablet Take 1 tablet (81 mg total) by mouth daily. 150 tablet 2  . atorvastatin (LIPITOR) 80 MG tablet Take 1 tablet (80 mg total) by mouth daily at 6 PM. 30 tablet 0  . celecoxib (CELEBREX) 200 MG capsule Take 200 mg by mouth daily as needed for mild pain.     Marland Kitchen clopidogrel (PLAVIX) 75 MG tablet Take 1 tablet (75 mg total) by mouth daily. 30 tablet 0  . diazepam (VALIUM) 5 MG tablet Take 5 mg by mouth 3 (three) times daily as needed for anxiety.   2  . diphenhydrAMINE (BENADRYL) 25 MG tablet Take 25 mg by mouth every 6 (six) hours as needed for itching or allergies.     Marland Kitchen  lidocaine-prilocaine (EMLA) cream Apply 1 application topically as needed. 30 g 0  . loperamide (IMODIUM A-D) 2 MG tablet Take 2 mg by mouth 4 (four) times daily as needed for diarrhea or loose stools.    . magnesium oxide (MAG-OX) 400 (241.3 Mg) MG tablet Take 1 tablet (400 mg total) by mouth daily. 30 tablet 0  . mirtazapine (REMERON) 15 MG tablet Take 1 tablet (15 mg total) by mouth at bedtime. 30 tablet 1  . oxyCODONE-acetaminophen (PERCOCET)  5-325 MG tablet Take 1 tablet by mouth every 6 (six) hours as needed for severe pain. 30 tablet 0  . polyethylene glycol (MIRALAX / GLYCOLAX) packet Take 17 g by mouth daily. 14 each 0  . potassium chloride 20 MEQ/15ML (10%) SOLN Take 30 mLs (40 mEq total) by mouth daily. 90 mL 0  . prochlorperazine (COMPAZINE) 10 MG tablet Take 1 tablet (10 mg total) by mouth every 6 (six) hours as needed for nausea or vomiting. 30 tablet 0  . senna-docusate (SENOKOT-S) 8.6-50 MG tablet Take 2 tablets by mouth at bedtime. 5 tablet 0  . traMADol (ULTRAM) 50 MG tablet Take 50 mg by mouth every 6 (six) hours as needed for moderate pain.     . Vitamin D, Ergocalciferol, (DRISDOL) 1.25 MG (50000 UT) CAPS capsule Take 1 capsule (50,000 Units total) by mouth every 7 (seven) days. 5 capsule 0   No current facility-administered medications for this visit.    Facility-Administered Medications Ordered in Other Visits  Medication Dose Route Frequency Provider Last Rate Last Dose  . sodium chloride flush (NS) 0.9 % injection 10 mL  10 mL Intracatheter PRN [name], MD   10 mL at 07/01/19 1234    SURGICAL HISTORY:  Past Surgical History:  Procedure Laterality Date  . ABDOMINAL HYSTERECTOMY  1981  . APPENDECTOMY     at age 34  . EYE SURGERY     left  . IR GENERIC HISTORICAL  06/03/2016   IR FLUORO GUIDE PORT INSERTION RIGHT 06/03/2016 [facility]  . IR GENERIC HISTORICAL  06/03/2016   IR US GUIDE VASC ACCESS RIGHT 06/03/2016 [facility]  . SKIN CANCER EXCISION     basal cell carcinoma L side of face    REVIEW OF SYSTEMS:   Review of Systems  Constitutional: Positive for fatigue. Negative for appetite change, chills, fever and unexpected weight change.  HENT: Negative for mouth sores, nosebleeds, sore throat and trouble swallowing.   Eyes: Negative for eye problems and icterus.  Respiratory: Negative for cough, hemoptysis, shortness of breath and wheezing.   Cardiovascular: Negative for chest pain and  leg swelling.  Gastrointestinal: Negative for abdominal pain, constipation, diarrhea, nausea and vomiting.  Genitourinary: Negative for bladder incontinence, difficulty urinating, dysuria, frequency and hematuria.   Musculoskeletal: Negative for back pain, gait problem, neck pain and neck stiffness.  Skin: Positive for rare small, dry, red lesions. Positive for mild itching.   Neurological: Negative for dizziness, extremity weakness, gait problem, headaches, [name], depression and sleep disturbance. The patient is not nervous/anxious.     PHYSICAL EXAMINATION:  Blood pressure 110/85, pulse 89, temperature 98.7 F (37.1 C), temperature source Temporal, resp. rate 18, height 5\' 3"  (1.6 m), weight 122 lb 4.8 oz (55.5 kg), SpO2 97 %.  ECOG PERFORMANCE STATUS: 1 - Symptomatic but completely ambulatory  Physical Exam  Constitutional: Oriented to person, place, and time and well-developed, well-nourished,  and in no distress.  HENT:  Head: Normocephalic and atraumatic.  Mouth/Throat: Oropharynx is clear and moist. No oropharyngeal exudate.  Eyes: Conjunctivae are normal. Right eye exhibits no discharge. Left eye exhibits no discharge. No scleral icterus.  Neck: Normal range of motion. Neck supple.  Cardiovascular: Normal rate, regular rhythm, normal heart sounds and intact distal pulses.   Pulmonary/Chest: Effort normal and breath sounds normal. No respiratory distress. No wheezes. No rales.  Abdominal: Soft. Bowel sounds are normal. Exhibits no distension and no mass. There is no tenderness.  Musculoskeletal: Normal range of motion. Exhibits no edema.  Lymphadenopathy:    No cervical adenopathy.  Neurological: Alert and oriented to person, place, and time. Exhibits normal muscle tone. Gait normal. Coordination normal.  Skin: Positive for circular, dry,  red lesion on left forearm. Skin is warm and dry. Not diaphoretic. No pallor.  Psychiatric: Mood, memory and judgment normal.  Vitals reviewed.  LABORATORY DATA: Lab Results  Component Value Date   WBC 9.6 07/01/2019   HGB 12.4 07/01/2019   HCT 35.9 (L) 07/01/2019   MCV 108.5 (H) 07/01/2019   PLT 272 07/01/2019      Chemistry      Component Value Date/Time   NA 141 07/01/2019 0950   NA 141 08/01/2017 0835   K 3.3 (L) 07/01/2019 0950   K 3.5 08/01/2017 0835   CL 103 07/01/2019 0950   CO2 27 07/01/2019 0950   CO2 25 08/01/2017 0835   BUN 4 (L) 07/01/2019 0950   BUN 8.0 08/01/2017 0835   CREATININE 0.70 07/01/2019 0950   CREATININE 0.9 08/01/2017 0835      Component Value Date/Time   CALCIUM 9.3 07/01/2019 0950   CALCIUM 9.9 08/01/2017 0835   ALKPHOS 138 (H) 07/01/2019 0950   ALKPHOS 142 08/01/2017 0835   AST 21 07/01/2019 0950   AST 15 08/01/2017 0835   ALT 21 07/01/2019 0950   ALT 19 08/01/2017 0835   BILITOT 0.3 07/01/2019 0950   BILITOT 0.35 08/01/2017 0835       RADIOGRAPHIC STUDIES:  Ct Chest W Contrast  Result Date: 06/08/2019 CLINICAL DATA:  Small cell lung cancer restaging. EXAM: CT CHEST, ABDOMEN, AND PELVIS WITH CONTRAST TECHNIQUE: Multidetector CT imaging of the chest, abdomen and pelvis was performed following the standard protocol during bolus administration of intravenous contrast. CONTRAST:  140mL OMNIPAQUE IOHEXOL 300 MG/ML  SOLN COMPARISON:  Multiple exams, including 03/16/2019 FINDINGS: CT CHEST FINDINGS Cardiovascular: Right Port-A-Cath tip: Cavoatrial junction. Coronary, aortic arch, and branch vessel atherosclerotic vascular disease. Severe stenosis of the proximal segment of the left common carotid artery. Mediastinum/Nodes: Right hilar lymph node measures 1.0 cm in short axis on image 27/2, stable. Stable minimal right paratracheal stranding without overt paratracheal adenopathy. Lungs/Pleura: The peripheral right airspace opacity in the apically  segment measures 3.2 by 1.7 cm, previously 3.2 by 2.5 cm. Adjacent interstitial accentuation probably from radiation therapy. No underlying well-defined nodule is readily apparent. There is plugging of airways extending into this process. Musculoskeletal: Similar appearance of compression fractures of T6, T7, and T8. This numbering assumes that the top most rib-bearing level is T1; there appear to be only 4 lumbar type vertebra, with only small ribs at the T12 level. CT ABDOMEN PELVIS FINDINGS Hepatobiliary: Stable hypodensity anteriorly in segment 4 adjacent to the falciform ligament, likely focal fatty infiltration. Gallbladder unremarkable. Pancreas: Unremarkable Spleen: Unremarkable Adrenals/Urinary Tract: Unremarkable Stomach/Bowel: Prominent stool throughout the colon favors constipation. Vascular/Lymphatic: Aortoiliac atherosclerotic vascular disease. Reproductive: Uterus absent.  Adnexa unremarkable. Other: Stable stranding in the central mesentery. Musculoskeletal: Chronic superior endplate compression at the L2 vertebral level. IMPRESSION: 1. Mild reduction in size of the peripheral right apical airspace opacity compared to the prior exam. 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 The previous pulmonary nodule in this region is not clearly seen. 2. Borderline enlarged right hilar lymph node, stable. 3. Stable mild stranding of the mesenteric adipose tissue ("misty mesentery") is present. This can be idiopathic, due to adjacent inflammation such as enteritis, from mild sclerosing mesenteritis, or rarely from other conditions such as cirrhosis, mesenteric lymphoma, or mesenteric venous thrombosis. 4. Other imaging findings of potential clinical significance: Aortic Atherosclerosis (ICD10-I70.0). Coronary atherosclerosis. Prominent stenosis of the proximal segment of the left common carotid artery. Remote compression fractures at T6, T7, T8, and L1 (note, there are only 4 lumbar type  non-rib-bearing vertebra). Prominent stool throughout the colon favors constipation. Electronically Signed   By: Van Clines M.D.   On: 06/08/2019 13:43   Ct Abdomen Pelvis W Contrast  Result Date: 06/08/2019 CLINICAL DATA:  Small cell lung cancer restaging. EXAM: CT CHEST, ABDOMEN, AND PELVIS WITH CONTRAST TECHNIQUE: Multidetector CT imaging of the chest, abdomen and pelvis was performed following the standard protocol during bolus administration of intravenous contrast. CONTRAST:  166mL OMNIPAQUE IOHEXOL 300 MG/ML  SOLN COMPARISON:  Multiple exams, including 03/16/2019 FINDINGS: CT CHEST FINDINGS Cardiovascular: Right Port-A-Cath tip: Cavoatrial junction. Coronary, aortic arch, and branch vessel atherosclerotic vascular disease. Severe stenosis of the proximal segment of the left common carotid artery. Mediastinum/Nodes: Right hilar lymph node measures 1.0 cm in short axis on image 27/2, stable. Stable minimal right paratracheal stranding without overt paratracheal adenopathy. Lungs/Pleura: The peripheral right airspace opacity in the apically segment measures 3.2 by 1.7 cm, previously 3.2 by 2.5 cm. Adjacent interstitial accentuation probably from radiation therapy. No underlying well-defined nodule is readily apparent. There is plugging of airways extending into this process. Musculoskeletal: Similar appearance of compression fractures of T6, T7, and T8. This numbering assumes that the top most rib-bearing level is T1; there appear to be only 4 lumbar type vertebra, with only small ribs at the T12 level. CT ABDOMEN PELVIS FINDINGS Hepatobiliary: Stable hypodensity anteriorly in segment 4 adjacent to the falciform ligament, likely focal fatty infiltration. Gallbladder unremarkable. Pancreas: Unremarkable Spleen: Unremarkable Adrenals/Urinary Tract: Unremarkable Stomach/Bowel: Prominent stool throughout the colon favors constipation. Vascular/Lymphatic: Aortoiliac atherosclerotic vascular disease.  Reproductive: Uterus absent.  Adnexa unremarkable. Other: Stable stranding in the central mesentery. Musculoskeletal: Chronic superior endplate compression at the L2 vertebral level. IMPRESSION: 1. Mild reduction in size of the peripheral right apical airspace opacity compared to the prior exam. 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 The previous pulmonary nodule in this region is not clearly seen. 2. Borderline enlarged right hilar lymph node, stable. 3. Stable mild stranding of the mesenteric adipose tissue ("misty mesentery") is present. This can be idiopathic, due to adjacent inflammation such as enteritis, from mild sclerosing mesenteritis, or rarely from other conditions such as cirrhosis, mesenteric lymphoma, or mesenteric venous thrombosis. 4. Other imaging findings of potential clinical significance: Aortic Atherosclerosis (ICD10-I70.0). Coronary atherosclerosis. Prominent stenosis of the proximal segment of the left common carotid artery. Remote compression fractures at T6, T7, T8, and L1 (note, there are only 4 lumbar type non-rib-bearing vertebra). Prominent stool throughout the colon favors constipation. Electronically Signed   By: Van Clines M.D.   On: 06/08/2019 13:43     ASSESSMENT/PLAN:  This is a very pleasant  58 year old Caucasian female with extensive stage small cell lung cancer. She presented with a right upper lobe lung mass, large right anterior mediastinal and supraclavicular lymphadenopathy as well as a pancreatic andsplenic metastasis. She was diagnosed in September 2017.  The patient underwent systemic chemotherapy with carboplatin and etoposide. She is status post 6 cycles. She tolerated treatment well except for chemotherapy-induced anemia which required pRBCs and platelet transfusions. She had a significant improvement of her disease with chemotherapy. The patientthenunderwent prophylactic cranial irradiation. She then underwent stereotactic  radiotherapy to the right upper lobe and pulmonary nodule.  She had been on observation for 2 years before showing evidence of disease progression.   She recently hadbeen started on systemic chemotherapy with carboplatin, etoposide, and Tecentriq. Starting from cycle #5, she has been on maintenance single agent Tecentriq. She has been tolerating treatment well. She is status post 12 cycles of single agent Tecentriq.  Labs were reviewed. I recommend that she proceed with cycle #13 today as scheduled.   We will see her back for a follow up visit in 3 weeks for evaluation before starting cycle #13.   The patient's potassium was slightly low today. I encouraged her to increase his dietary intake of potassium rich foods.   The patient was advised to call immediately if she has any concerning symptoms in the interval. The patient voices understanding of current disease status and treatment options and is in agreement with the current care plan. All questions were answered. The patient knows to call the clinic with any problems, questions or concerns. We can certainly see the patient much sooner if necessary  Orders Placed This Encounter  Procedures  . TSH    Standing Status:   Standing    Number of Occurrences:   5    Standing Expiration Date:   06/30/2020     Cassandra L Heilingoetter, PA-C 07/01/19

## 2019-07-22 ENCOUNTER — Inpatient Hospital Stay: Payer: Medicare Other | Attending: Internal Medicine

## 2019-07-22 ENCOUNTER — Inpatient Hospital Stay (HOSPITAL_BASED_OUTPATIENT_CLINIC_OR_DEPARTMENT_OTHER): Payer: Medicare Other | Admitting: Internal Medicine

## 2019-07-22 ENCOUNTER — Inpatient Hospital Stay: Payer: Medicare Other

## 2019-07-22 ENCOUNTER — Encounter: Payer: Self-pay | Admitting: Internal Medicine

## 2019-07-22 ENCOUNTER — Encounter: Payer: Self-pay | Admitting: *Deleted

## 2019-07-22 ENCOUNTER — Other Ambulatory Visit: Payer: Self-pay

## 2019-07-22 VITALS — BP 100/84 | HR 89 | Temp 98.0°F | Resp 17 | Ht 63.0 in | Wt 120.5 lb

## 2019-07-22 DIAGNOSIS — Z85828 Personal history of other malignant neoplasm of skin: Secondary | ICD-10-CM | POA: Insufficient documentation

## 2019-07-22 DIAGNOSIS — C3491 Malignant neoplasm of unspecified part of right bronchus or lung: Secondary | ICD-10-CM | POA: Diagnosis not present

## 2019-07-22 DIAGNOSIS — I10 Essential (primary) hypertension: Secondary | ICD-10-CM | POA: Diagnosis not present

## 2019-07-22 DIAGNOSIS — Z9221 Personal history of antineoplastic chemotherapy: Secondary | ICD-10-CM | POA: Diagnosis not present

## 2019-07-22 DIAGNOSIS — C3411 Malignant neoplasm of upper lobe, right bronchus or lung: Secondary | ICD-10-CM | POA: Insufficient documentation

## 2019-07-22 DIAGNOSIS — G894 Chronic pain syndrome: Secondary | ICD-10-CM | POA: Diagnosis not present

## 2019-07-22 DIAGNOSIS — Z923 Personal history of irradiation: Secondary | ICD-10-CM | POA: Insufficient documentation

## 2019-07-22 DIAGNOSIS — Z5112 Encounter for antineoplastic immunotherapy: Secondary | ICD-10-CM

## 2019-07-22 DIAGNOSIS — I639 Cerebral infarction, unspecified: Secondary | ICD-10-CM

## 2019-07-22 DIAGNOSIS — Z7982 Long term (current) use of aspirin: Secondary | ICD-10-CM | POA: Insufficient documentation

## 2019-07-22 DIAGNOSIS — R5383 Other fatigue: Secondary | ICD-10-CM

## 2019-07-22 DIAGNOSIS — Z79899 Other long term (current) drug therapy: Secondary | ICD-10-CM | POA: Diagnosis not present

## 2019-07-22 DIAGNOSIS — M810 Age-related osteoporosis without current pathological fracture: Secondary | ICD-10-CM | POA: Insufficient documentation

## 2019-07-22 DIAGNOSIS — F329 Major depressive disorder, single episode, unspecified: Secondary | ICD-10-CM | POA: Diagnosis not present

## 2019-07-22 DIAGNOSIS — E785 Hyperlipidemia, unspecified: Secondary | ICD-10-CM | POA: Diagnosis not present

## 2019-07-22 LAB — CMP (CANCER CENTER ONLY)
ALT: 23 U/L (ref 0–44)
AST: 19 U/L (ref 15–41)
Albumin: 3.5 g/dL (ref 3.5–5.0)
Alkaline Phosphatase: 137 U/L — ABNORMAL HIGH (ref 38–126)
Anion gap: 11 (ref 5–15)
BUN: 5 mg/dL — ABNORMAL LOW (ref 6–20)
CO2: 26 mmol/L (ref 22–32)
Calcium: 9 mg/dL (ref 8.9–10.3)
Chloride: 102 mmol/L (ref 98–111)
Creatinine: 0.76 mg/dL (ref 0.44–1.00)
GFR, Est AFR Am: >60 mL/min (ref >60)
GFR, Estimated: >60 mL/min (ref >60)
Glucose, Bld: 110 mg/dL — ABNORMAL HIGH (ref 70–99)
Potassium: 3.1 mmol/L — ABNORMAL LOW (ref 3.5–5.1)
Sodium: 139 mmol/L (ref 135–145)
Total Bilirubin: 0.5 mg/dL (ref 0.3–1.2)
Total Protein: 7 g/dL (ref 6.5–8.1)

## 2019-07-22 LAB — CBC WITH DIFFERENTIAL (CANCER CENTER ONLY)
Abs Immature Granulocytes: 0.04 10*3/uL (ref 0.00–0.07)
Basophils Absolute: 0 10*3/uL (ref 0.0–0.1)
Basophils Relative: 0 %
Eosinophils Absolute: 0.2 10*3/uL (ref 0.0–0.5)
Eosinophils Relative: 2 %
HCT: 37 % (ref 36.0–46.0)
Hemoglobin: 12.5 g/dL (ref 12.0–15.0)
Immature Granulocytes: 0 %
Lymphocytes Relative: 14 %
Lymphs Abs: 1.5 10*3/uL (ref 0.7–4.0)
MCH: 37.3 pg — ABNORMAL HIGH (ref 26.0–34.0)
MCHC: 33.8 g/dL (ref 30.0–36.0)
MCV: 110.4 fL — ABNORMAL HIGH (ref 80.0–100.0)
Monocytes Absolute: 0.5 10*3/uL (ref 0.1–1.0)
Monocytes Relative: 5 %
Neutro Abs: 8.2 10*3/uL — ABNORMAL HIGH (ref 1.7–7.7)
Neutrophils Relative %: 79 %
Platelet Count: 278 10*3/uL (ref 150–400)
RBC: 3.35 MIL/uL — ABNORMAL LOW (ref 3.87–5.11)
RDW: 11.9 % (ref 11.5–15.5)
WBC Count: 10.5 10*3/uL (ref 4.0–10.5)
nRBC: 0 % (ref 0.0–0.2)

## 2019-07-22 LAB — TSH: TSH: 1.454 u[IU]/mL (ref 0.308–3.960)

## 2019-07-22 MED ORDER — HEPARIN SOD (PORK) LOCK FLUSH 100 UNIT/ML IV SOLN
500.0000 [IU] | Freq: Once | INTRAVENOUS | Status: AC | PRN
  Administered 2019-07-22: 14:00:00 500 [IU]
  Filled 2019-07-22: qty 5

## 2019-07-22 MED ORDER — SODIUM CHLORIDE 0.9 % IV SOLN
1200.0000 mg | Freq: Once | INTRAVENOUS | Status: AC
  Administered 2019-07-22: 13:00:00 1200 mg via INTRAVENOUS
  Filled 2019-07-22: qty 20

## 2019-07-22 MED ORDER — SODIUM CHLORIDE 0.9% FLUSH
10.0000 mL | INTRAVENOUS | Status: DC | PRN
  Administered 2019-07-22: 14:00:00 10 mL
  Filled 2019-07-22: qty 10

## 2019-07-22 MED ORDER — SODIUM CHLORIDE 0.9 % IV SOLN
Freq: Once | INTRAVENOUS | Status: AC
  Administered 2019-07-22: 12:00:00 via INTRAVENOUS
  Filled 2019-07-22: qty 250

## 2019-07-22 MED ORDER — SODIUM CHLORIDE 0.9% FLUSH
10.0000 mL | INTRAVENOUS | Status: DC | PRN
  Administered 2019-07-22: 10 mL
  Filled 2019-07-22: qty 10

## 2019-07-22 NOTE — Progress Notes (Signed)
Oncology Nurse Navigator Documentation  Oncology Nurse Navigator Flowsheets 07/22/2019  Abnormal Finding Date -  Confirmed Diagnosis Date -  Navigator Location [facility]  Referral Date to RadOnc/MedOnc -  Navigator Encounter Type Clinic/[facility]  Telephone -  Treatment Initiated Date -  Treatment Phase Treatment  Barriers/Navigation Needs Education/I asked patient if she would like help signing up for [facility].  She declined at this time.   Education Other  Interventions Education  Acuity Level 2-Minimal Needs (1-2 Barriers Identified)  Coordination of Care -  Education Method Verbal  Time Spent with Patient 15

## 2019-07-22 NOTE — Progress Notes (Signed)
[place] [phone]   [phone]) [phone]  OFFICE PROGRESS NOTE  [name], MD [address] [place] [address]  DIAGNOSIS: Extensive stage (T2a, N3, [name], large right anterior mediastinal and supraclavicular lymphadenopathy as well as pancreatic and splenic metastasis diagnosed in September 2017.  The patient had disease progression in October 2019.  PRIOR THERAPY:  1) Systemic chemotherapy was carboplatin for AUC of 5 on day 1 and etoposide 100 MG/M2 on days 1, 2 and 3 with Neulasta support. Status post 6 cycles with significant response of her disease. 2) Prophylactic cranial irradiation under the care of Dr. Sondra Come on 12/02/2016. 3) stereotactic radiotherapy to the recurrent right upper lobe pulmonary nodule under the care of Dr. Sondra Come completed November 10, 2017. 4) Retreatment with systemic chemotherapy with carboplatin for AUC of 5 on day 1 and etoposide 100 mg/M2 on days 1, 2 and 3 as well as Tecentriq (Atezolizumab) 1200 mg IV every 3 weeks with Neulasta support.  First dose June 22, 2018 for disease recurrence.  Status post 5 cycles.  Starting from cycle #2 her dose of carboplatin will be reduced to AUC of 4 and etoposide 80 mg/M2 on days 1, 2 and 3 in addition to the regular dose of Tecentriq.  CURRENT THERAPY: Maintenance treatment with single agent Tecentriq 1200 mg IV every 3 weeks.  Status post 13 cycles.  INTERVAL HISTORY: Debra Kidd 58 y.o. female returns to the clinic today for follow-up visit.  The patient is feeling fine today with no concerning complaints except for mild fatigue.  She denied having any current chest pain, shortness of breath, cough or hemoptysis.  She denied having any fever or chills.  She has no nausea, vomiting, diarrhea or constipation.  She has no headache or visual changes.  She continues to tolerate her maintenance treatment with Tecentriq  fairly well.  The patient is here today for evaluation before starting cycle #13 of her maintenance treatment.  MEDICAL HISTORY: Past Medical History:  Diagnosis Date  . 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 Blood type, Rh positive   . Cancer ([place])   . Chronic pain syndrome   . Depression 08/07/2016  . Encounter for antineoplastic chemotherapy 07/17/2016  . History of external beam radiation therapy 11/19/16-12/02/16   brain 25 Gy in 10 fractions  . Hyperlipidemia   . Hypertension   . Hypertension 08/07/2016  . Osteoporosis   . Small cell lung cancer ([place]) dx'd 05/2016    ALLERGIES:  is allergic to codeine; motrin [ibuprofen]; thiazide-type diuretics; and vicodin [hydrocodone-acetaminophen].  MEDICATIONS:  Current Outpatient Medications  Medication Sig Dispense Refill  . aspirin EC 81 MG tablet Take 1 tablet (81 mg total) by mouth daily. 150 tablet 2  . atorvastatin (LIPITOR) 80 MG tablet Take 1 tablet (80 mg total) by mouth daily at 6 PM. 30 tablet 0  . celecoxib (CELEBREX) 200 MG capsule Take 200 mg by mouth daily as needed for mild pain.     Marland Kitchen clopidogrel (PLAVIX) 75 MG tablet Take 1 tablet (75 mg total) by mouth daily. 30 tablet 0  . diazepam (VALIUM) 5 MG tablet Take 5 mg by mouth 3 (three) times daily as needed for anxiety.   2  . diphenhydrAMINE (BENADRYL) 25 MG tablet Take 25 mg by mouth every 6 (six) hours as needed for itching or allergies.     Marland Kitchen  lidocaine-prilocaine (EMLA) cream Apply 1 application topically as needed. 30 g 0  . magnesium oxide (MAG-OX) 400 (241.3 Mg) MG tablet Take 1 tablet (400 mg total) by mouth daily. 30 tablet 0  . mirtazapine (REMERON) 15 MG tablet Take 1 tablet (15 mg total) by mouth at bedtime. 30 tablet 1  . oxyCODONE-acetaminophen (PERCOCET) 5-325 MG tablet Take 1 tablet by mouth every 6 (six) hours as needed for severe pain. 30 tablet 0  . polyethylene glycol (MIRALAX / GLYCOLAX) packet Take 17 g by mouth daily. 14 each 0  . potassium  chloride 20 MEQ/15ML (10%) SOLN Take 30 mLs (40 mEq total) by mouth daily. 90 mL 0  . prochlorperazine (COMPAZINE) 10 MG tablet Take 1 tablet (10 mg total) by mouth every 6 (six) hours as needed for nausea or vomiting. 30 tablet 0  . senna-docusate (SENOKOT-S) 8.6-50 MG tablet Take 2 tablets by mouth at bedtime. 5 tablet 0  . traMADol (ULTRAM) 50 MG tablet Take 50 mg by mouth every 6 (six) hours as needed for moderate pain.     . Vitamin D, Ergocalciferol, (DRISDOL) 1.25 MG (50000 UT) CAPS capsule Take 1 capsule (50,000 Units total) by mouth every 7 (seven) days. 5 capsule 0  . loperamide (IMODIUM A-D) 2 MG tablet Take 2 mg by mouth 4 (four) times daily as needed for diarrhea or loose stools.     No current facility-administered medications for this visit.     SURGICAL HISTORY:  Past Surgical History:  Procedure Laterality Date  . ABDOMINAL HYSTERECTOMY  1981  . APPENDECTOMY     at age 73  . EYE SURGERY     left  . IR GENERIC HISTORICAL  06/03/2016   IR FLUORO GUIDE PORT INSERTION RIGHT 06/03/2016 [facility]  . IR GENERIC HISTORICAL  06/03/2016   IR US GUIDE VASC ACCESS RIGHT 06/03/2016 [facility]  . SKIN CANCER EXCISION     basal cell carcinoma L side of face    REVIEW OF SYSTEMS:  A comprehensive review of systems was negative except for: Constitutional: positive for fatigue   PHYSICAL EXAMINATION: General appearance: alert, cooperative and no distress Head: Normocephalic, without obvious abnormality, atraumatic Neck: no adenopathy, no JVD, supple, symmetrical, trachea midline and thyroid not enlarged, symmetric, no tenderness/mass/nodules Lymph nodes: Cervical, supraclavicular, and axillary nodes normal. Resp: clear to auscultation bilaterally Back: symmetric, no curvature. ROM normal. No CVA tenderness. Cardio: regular rate and rhythm, S1, S2 normal, no murmur, click, rub or gallop GI: soft, non-tender; bowel sounds normal; no masses,  no organomegaly Extremities:  extremities normal, atraumatic, no cyanosis or edema  ECOG PERFORMANCE STATUS: 1 - Symptomatic but completely ambulatory  Blood pressure 100/84, pulse 89, temperature 98 F (36.7 C), temperature source Temporal, resp. rate 17, height 5\' 3"  (1.6 m), weight 120 lb 8 oz (54.7 kg), SpO2 99 %.  LABORATORY DATA: Lab Results  Component Value Date   WBC 10.5 07/22/2019   HGB 12.5 07/22/2019   HCT 37.0 07/22/2019   MCV 110.4 (H) 07/22/2019   PLT 278 07/22/2019      Chemistry      Component Value Date/Time   NA 139 07/22/2019 1046   NA 141 08/01/2017 0835   K 3.1 (L) 07/22/2019 1046   K 3.5 08/01/2017 0835   CL 102 07/22/2019 1046   CO2 26 07/22/2019 1046   CO2 25 08/01/2017 0835   BUN 5 (L) 07/22/2019 1046   BUN 8.0 08/01/2017 0835   CREATININE 0.76 07/22/2019 1046  CREATININE 0.9 08/01/2017 0835      Component Value Date/Time   CALCIUM 9.0 07/22/2019 1046   CALCIUM 9.9 08/01/2017 0835   ALKPHOS 137 (H) 07/22/2019 1046   ALKPHOS 142 08/01/2017 0835   AST 19 07/22/2019 1046   AST 15 08/01/2017 0835   ALT 23 07/22/2019 1046   ALT 19 08/01/2017 0835   BILITOT 0.5 07/22/2019 1046   BILITOT 0.35 08/01/2017 0835       RADIOGRAPHIC STUDIES: No results found.  ASSESSMENT AND PLAN:  This is a very pleasant 58 years old white female with extensive stage small cell lung cancer status post systemic chemotherapy with carbo platinum and etoposide for 6 cycles and the patient rotated her treatment well except for chemotherapy-induced anemia and requirement for PRBCs and platelet transfusion. She had significant improvement in her disease with the chemotherapy. She also had prophylactic cranial irradiation. She also underwent stereotactic radiotherapy to progressive right upper lobe pulmonary nodule. The patient has been in observation for close to 2 years. Repeat CT scan of the chest, abdomen and pelvis performed recently showed evidence for disease progression in the abdomen. The  patient was started on systemic chemotherapy again with carboplatin, etoposide and Tecentriq status post 17 cycles.  Starting from cycle #5 the patient is treated with maintenance single agent Tecentriq. The patient continues to tolerate this treatment well with no concerning complaints except for mild fatigue. I recommended for her to proceed with cycle #18 of her treatment today as planned. I will see the patient back for follow-up visit in 3 weeks for evaluation before the next cycle of her treatment.  I would consider her for repeat imaging studies after the next cycle of her treatment. She was advised to call immediately if she has any concerning symptoms in the interval. The patient voices understanding of current disease status and treatment options and is in agreement with the current care plan. All questions were answered. The patient knows to call the clinic with any problems, questions or concerns. We can certainly see the patient much sooner if necessary.  Disclaimer: This note was dictated with voice recognition software. Similar sounding words can inadvertently be transcribed and may not be corrected upon review.

## 2019-07-22 NOTE — Patient Instructions (Signed)
[facility] Discharge Instructions for Patients Receiving Chemotherapy  Today you received the following chemotherapy agents Atezolizumab [facility]).  To help prevent nausea and vomiting after your treatment, we encourage you to take your nausea medication as prescribed.   If you develop nausea and vomiting that is not controlled by your nausea medication, call the clinic.   BELOW ARE SYMPTOMS THAT SHOULD BE REPORTED IMMEDIATELY:  *FEVER GREATER THAN 100.5 F  *CHILLS WITH OR WITHOUT FEVER  NAUSEA AND VOMITING THAT IS NOT CONTROLLED WITH YOUR NAUSEA MEDICATION  *UNUSUAL SHORTNESS OF BREATH  *UNUSUAL BRUISING OR BLEEDING  TENDERNESS IN MOUTH AND THROAT WITH OR WITHOUT PRESENCE OF ULCERS  *URINARY PROBLEMS  *BOWEL PROBLEMS  UNUSUAL RASH Items with * indicate a potential emergency and should be followed up as soon as possible.  Feel free to call the clinic should you have any questions or concerns. The clinic phone number is [phone].  Please show the [facility] at check-in to the Emergency Department and triage nurse.  Coronavirus (COVID-19) Are you at risk?  Are you at risk for the Coronavirus (COVID-19)?  To be considered HIGH RISK for Coronavirus (COVID-19), you have to meet the following criteria:  . Traveled to [place], [place], [place], [place] or [place]; or in the [place] to [place], [place], [place], or [place]; and have fever, cough, and shortness of breath within the last 2 weeks of travel OR . Been in close contact with a person diagnosed with COVID-19 within the last 2 weeks and have fever, cough, and shortness of breath . IF YOU DO NOT MEET THESE CRITERIA, YOU ARE CONSIDERED LOW RISK FOR COVID-19.  What to do if you are HIGH RISK for COVID-19?  Marland Kitchen If you are having a medical emergency, call 911. . Seek medical care right away. Before you go to a doctor's office, urgent care or emergency department, call ahead and tell  them about your recent travel, contact with someone diagnosed with COVID-19, and your symptoms. You should receive instructions from your physician's office regarding next steps of care.  . When you arrive at healthcare provider, tell the healthcare staff immediately you have returned from visiting [place], [place], [place], [place] or [place]; or traveled in the [place] to [place], [place], [place], or [place]; in the last two weeks or you have been in close contact with a person diagnosed with COVID-19 in the last 2 weeks.   . Tell the health care staff about your symptoms: fever, cough and shortness of breath. . After you have been seen by a medical provider, you will be either: o Tested for (COVID-19) and discharged home on quarantine except to seek medical care if symptoms worsen, and asked to  - Stay home and avoid contact with others until you get your results (4-5 days)  - Avoid travel on public transportation if possible (such as bus, train, or airplane) or o Sent to the Emergency Department by EMS for evaluation, COVID-19 testing, and possible admission depending on your condition and test results.  What to do if you are LOW RISK for COVID-19?  Reduce your risk of any infection by using the same precautions used for avoiding the common cold or flu:  Marland Kitchen Wash your hands often with soap and warm water for at least 20 seconds.  If soap and water are not readily available, use an alcohol-based hand sanitizer with at least 60% alcohol.  . If coughing or  sneezing, cover your mouth and nose by coughing or sneezing into the elbow areas of your shirt or coat, into a tissue or into your sleeve (not your hands). . Avoid shaking hands with others and consider head nods or verbal greetings only. . Avoid touching your eyes, nose, or mouth with unwashed hands.  . Avoid close contact with people who are sick. . Avoid places or events with large numbers of people in one location, like concerts or  sporting events. . Carefully consider travel plans you have or are making. . If you are planning any travel outside or inside the [place], visit the CDC's Travelers' Health webpage for the latest health notices. . If you have some symptoms but not all symptoms, continue to monitor at home and seek medical attention if your symptoms worsen. . If you are having a medical emergency, call 911.   [place] / e-Visit: [email or web address]         [facility]: [facility]: [phone]                   [facility]: [phone]

## 2019-07-23 ENCOUNTER — Telehealth: Payer: Self-pay | Admitting: Internal Medicine

## 2019-07-23 NOTE — Telephone Encounter (Signed)
Scheduled per los. Called and left msg. Mailed printout  °

## 2019-08-12 ENCOUNTER — Encounter: Payer: Self-pay | Admitting: *Deleted

## 2019-08-12 ENCOUNTER — Inpatient Hospital Stay: Payer: Medicare Other

## 2019-08-12 ENCOUNTER — Other Ambulatory Visit: Payer: Self-pay | Admitting: *Deleted

## 2019-08-12 ENCOUNTER — Other Ambulatory Visit: Payer: Self-pay

## 2019-08-12 ENCOUNTER — Inpatient Hospital Stay: Payer: Medicare Other | Attending: Internal Medicine | Admitting: Internal Medicine

## 2019-08-12 ENCOUNTER — Encounter: Payer: Self-pay | Admitting: Internal Medicine

## 2019-08-12 VITALS — BP 121/88 | HR 88 | Temp 98.0°F | Resp 18 | Ht 63.0 in | Wt 117.7 lb

## 2019-08-12 DIAGNOSIS — C3491 Malignant neoplasm of unspecified part of right bronchus or lung: Secondary | ICD-10-CM

## 2019-08-12 DIAGNOSIS — I700 Atherosclerosis of aorta: Secondary | ICD-10-CM | POA: Diagnosis not present

## 2019-08-12 DIAGNOSIS — R5383 Other fatigue: Secondary | ICD-10-CM | POA: Insufficient documentation

## 2019-08-12 DIAGNOSIS — Z79899 Other long term (current) drug therapy: Secondary | ICD-10-CM | POA: Diagnosis not present

## 2019-08-12 DIAGNOSIS — G894 Chronic pain syndrome: Secondary | ICD-10-CM | POA: Insufficient documentation

## 2019-08-12 DIAGNOSIS — M810 Age-related osteoporosis without current pathological fracture: Secondary | ICD-10-CM | POA: Diagnosis not present

## 2019-08-12 DIAGNOSIS — Z5112 Encounter for antineoplastic immunotherapy: Secondary | ICD-10-CM

## 2019-08-12 DIAGNOSIS — Z923 Personal history of irradiation: Secondary | ICD-10-CM | POA: Insufficient documentation

## 2019-08-12 DIAGNOSIS — C3411 Malignant neoplasm of upper lobe, right bronchus or lung: Secondary | ICD-10-CM | POA: Insufficient documentation

## 2019-08-12 DIAGNOSIS — Z9221 Personal history of antineoplastic chemotherapy: Secondary | ICD-10-CM | POA: Diagnosis not present

## 2019-08-12 DIAGNOSIS — I10 Essential (primary) hypertension: Secondary | ICD-10-CM | POA: Insufficient documentation

## 2019-08-12 DIAGNOSIS — L299 Pruritus, unspecified: Secondary | ICD-10-CM | POA: Insufficient documentation

## 2019-08-12 DIAGNOSIS — Z85828 Personal history of other malignant neoplasm of skin: Secondary | ICD-10-CM | POA: Insufficient documentation

## 2019-08-12 DIAGNOSIS — K5900 Constipation, unspecified: Secondary | ICD-10-CM | POA: Insufficient documentation

## 2019-08-12 DIAGNOSIS — R05 Cough: Secondary | ICD-10-CM | POA: Insufficient documentation

## 2019-08-12 DIAGNOSIS — E785 Hyperlipidemia, unspecified: Secondary | ICD-10-CM | POA: Diagnosis not present

## 2019-08-12 DIAGNOSIS — I2510 Atherosclerotic heart disease of native coronary artery without angina pectoris: Secondary | ICD-10-CM | POA: Insufficient documentation

## 2019-08-12 DIAGNOSIS — I639 Cerebral infarction, unspecified: Secondary | ICD-10-CM | POA: Diagnosis not present

## 2019-08-12 DIAGNOSIS — F329 Major depressive disorder, single episode, unspecified: Secondary | ICD-10-CM | POA: Insufficient documentation

## 2019-08-12 LAB — CMP (CANCER CENTER ONLY)
ALT: 15 U/L (ref 0–44)
AST: 18 U/L (ref 15–41)
Albumin: 3.5 g/dL (ref 3.5–5.0)
Alkaline Phosphatase: 130 U/L — ABNORMAL HIGH (ref 38–126)
Anion gap: 11 (ref 5–15)
BUN: 5 mg/dL — ABNORMAL LOW (ref 6–20)
CO2: 27 mmol/L (ref 22–32)
Calcium: 8.9 mg/dL (ref 8.9–10.3)
Chloride: 103 mmol/L (ref 98–111)
Creatinine: 0.69 mg/dL (ref 0.44–1.00)
GFR, Est AFR Am: >60 mL/min (ref >60)
GFR, Estimated: >60 mL/min (ref >60)
Glucose, Bld: 101 mg/dL — ABNORMAL HIGH (ref 70–99)
Potassium: 3.5 mmol/L (ref 3.5–5.1)
Sodium: 141 mmol/L (ref 135–145)
Total Bilirubin: 0.3 mg/dL (ref 0.3–1.2)
Total Protein: 6.9 g/dL (ref 6.5–8.1)

## 2019-08-12 LAB — CBC WITH DIFFERENTIAL (CANCER CENTER ONLY)
Abs Immature Granulocytes: 0.04 10*3/uL (ref 0.00–0.07)
Basophils Absolute: 0 10*3/uL (ref 0.0–0.1)
Basophils Relative: 0 %
Eosinophils Absolute: 0.2 10*3/uL (ref 0.0–0.5)
Eosinophils Relative: 2 %
HCT: 37.8 % (ref 36.0–46.0)
Hemoglobin: 12.8 g/dL (ref 12.0–15.0)
Immature Granulocytes: 0 %
Lymphocytes Relative: 17 %
Lymphs Abs: 1.8 10*3/uL (ref 0.7–4.0)
MCH: 36.4 pg — ABNORMAL HIGH (ref 26.0–34.0)
MCHC: 33.9 g/dL (ref 30.0–36.0)
MCV: 107.4 fL — ABNORMAL HIGH (ref 80.0–100.0)
Monocytes Absolute: 0.6 10*3/uL (ref 0.1–1.0)
Monocytes Relative: 5 %
Neutro Abs: 7.9 10*3/uL — ABNORMAL HIGH (ref 1.7–7.7)
Neutrophils Relative %: 76 %
Platelet Count: 269 10*3/uL (ref 150–400)
RBC: 3.52 MIL/uL — ABNORMAL LOW (ref 3.87–5.11)
RDW: 12 % (ref 11.5–15.5)
WBC Count: 10.6 10*3/uL — ABNORMAL HIGH (ref 4.0–10.5)
nRBC: 0 % (ref 0.0–0.2)

## 2019-08-12 LAB — TSH: TSH: 1.535 u[IU]/mL (ref 0.308–3.960)

## 2019-08-12 MED ORDER — SODIUM CHLORIDE 0.9% FLUSH
10.0000 mL | INTRAVENOUS | Status: DC | PRN
  Administered 2019-08-12: 09:00:00 10 mL
  Filled 2019-08-12: qty 10

## 2019-08-12 MED ORDER — SODIUM CHLORIDE 0.9% FLUSH
10.0000 mL | INTRAVENOUS | Status: DC | PRN
  Administered 2019-08-12: 12:00:00 10 mL
  Filled 2019-08-12: qty 10

## 2019-08-12 MED ORDER — SODIUM CHLORIDE 0.9 % IV SOLN
1200.0000 mg | Freq: Once | INTRAVENOUS | Status: AC
  Administered 2019-08-12: 1200 mg via INTRAVENOUS
  Filled 2019-08-12: qty 20

## 2019-08-12 MED ORDER — HEPARIN SOD (PORK) LOCK FLUSH 100 UNIT/ML IV SOLN
500.0000 [IU] | Freq: Once | INTRAVENOUS | Status: AC | PRN
  Administered 2019-08-12: 12:00:00 500 [IU]
  Filled 2019-08-12: qty 5

## 2019-08-12 MED ORDER — SODIUM CHLORIDE 0.9 % IV SOLN
Freq: Once | INTRAVENOUS | Status: AC
  Administered 2019-08-12: 11:00:00 via INTRAVENOUS
  Filled 2019-08-12: qty 250

## 2019-08-12 NOTE — Progress Notes (Signed)
[place] [phone]   Fax:(336) [phone]  OFFICE PROGRESS NOTE  [name], MD [address] [place] [address]  DIAGNOSIS: Extensive stage (T2a, N3, [name], large right anterior mediastinal and supraclavicular lymphadenopathy as well as pancreatic and splenic metastasis diagnosed in September 2017.  The patient had disease progression in October 2019.  PRIOR THERAPY:  1) Systemic chemotherapy was carboplatin for AUC of 5 on day 1 and etoposide 100 MG/M2 on days 1, 2 and 3 with Neulasta support. Status post 6 cycles with significant response of her disease. 2) Prophylactic cranial irradiation under the care of Dr. Sondra Come on 12/02/2016. 3) stereotactic radiotherapy to the recurrent right upper lobe pulmonary nodule under the care of Dr. Sondra Come completed November 10, 2017. 4) Retreatment with systemic chemotherapy with carboplatin for AUC of 5 on day 1 and etoposide 100 mg/M2 on days 1, 2 and 3 as well as Tecentriq (Atezolizumab) 1200 mg IV every 3 weeks with Neulasta support.  First dose June 22, 2018 for disease recurrence.  Status post 5 cycles.  Starting from cycle #2 her dose of carboplatin will be reduced to AUC of 4 and etoposide 80 mg/M2 on days 1, 2 and 3 in addition to the regular dose of Tecentriq.  CURRENT THERAPY: Maintenance treatment with single agent Tecentriq 1200 mg IV every 3 weeks.  Status post 14 cycles.  INTERVAL HISTORY: Debra Kidd 58 y.o. female returns to the clinic today for follow-up visit.  The patient is feeling fine today with no concerning complaints.  She denied having any chest pain, shortness of breath, cough or hemoptysis.  She continues to have mild fatigue.  She denied having any nausea, vomiting, diarrhea or constipation.  She denied having any headache or visual changes.  She continues to tolerate her maintenance treatment with Tecentriq fairly well.  She  is here today for evaluation before starting cycle #15.  MEDICAL HISTORY: Past Medical History:  Diagnosis Date  . 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 Blood type, Rh positive   . Cancer ([place])   . Chronic pain syndrome   . Depression 08/07/2016  . Encounter for antineoplastic chemotherapy 07/17/2016  . History of external beam radiation therapy 11/19/16-12/02/16   brain 25 Gy in 10 fractions  . Hyperlipidemia   . Hypertension   . Hypertension 08/07/2016  . Osteoporosis   . Small cell lung cancer ([place]) dx'd 05/2016    ALLERGIES:  is allergic to codeine; motrin [ibuprofen]; thiazide-type diuretics; and vicodin [hydrocodone-acetaminophen].  MEDICATIONS:  Current Outpatient Medications  Medication Sig Dispense Refill  . aspirin EC 81 MG tablet Take 1 tablet (81 mg total) by mouth daily. 150 tablet 2  . atorvastatin (LIPITOR) 80 MG tablet Take 1 tablet (80 mg total) by mouth daily at 6 PM. 30 tablet 0  . celecoxib (CELEBREX) 200 MG capsule Take 200 mg by mouth daily as needed for mild pain.     Marland Kitchen clopidogrel (PLAVIX) 75 MG tablet Take 1 tablet (75 mg total) by mouth daily. 30 tablet 0  . diazepam (VALIUM) 5 MG tablet Take 5 mg by mouth 3 (three) times daily as needed for anxiety.   2  . diphenhydrAMINE (BENADRYL) 25 MG tablet Take 25 mg by mouth every 6 (six) hours as needed for itching or allergies.     Marland Kitchen lidocaine-prilocaine (EMLA) cream Apply  1 application topically as needed. 30 g 0  . loperamide (IMODIUM A-D) 2 MG tablet Take 2 mg by mouth 4 (four) times daily as needed for diarrhea or loose stools.    . magnesium oxide (MAG-OX) 400 (241.3 Mg) MG tablet Take 1 tablet (400 mg total) by mouth daily. 30 tablet 0  . mirtazapine (REMERON) 15 MG tablet Take 1 tablet (15 mg total) by mouth at bedtime. 30 tablet 1  . oxyCODONE-acetaminophen (PERCOCET) 5-325 MG tablet Take 1 tablet by mouth every 6 (six) hours as needed for severe pain. 30 tablet 0  . polyethylene glycol (MIRALAX  / GLYCOLAX) packet Take 17 g by mouth daily. 14 each 0  . potassium chloride 20 MEQ/15ML (10%) SOLN Take 30 mLs (40 mEq total) by mouth daily. 90 mL 0  . prochlorperazine (COMPAZINE) 10 MG tablet Take 1 tablet (10 mg total) by mouth every 6 (six) hours as needed for nausea or vomiting. 30 tablet 0  . senna-docusate (SENOKOT-S) 8.6-50 MG tablet Take 2 tablets by mouth at bedtime. 5 tablet 0  . traMADol (ULTRAM) 50 MG tablet Take 50 mg by mouth every 6 (six) hours as needed for moderate pain.     . Vitamin D, Ergocalciferol, (DRISDOL) 1.25 MG (50000 UT) CAPS capsule Take 1 capsule (50,000 Units total) by mouth every 7 (seven) days. 5 capsule 0   No current facility-administered medications for this visit.    SURGICAL HISTORY:  Past Surgical History:  Procedure Laterality Date  . ABDOMINAL HYSTERECTOMY  1981  . APPENDECTOMY     at age 9  . EYE SURGERY     left  . IR GENERIC HISTORICAL  06/03/2016   IR FLUORO GUIDE PORT INSERTION RIGHT 06/03/2016 [facility]  . IR GENERIC HISTORICAL  06/03/2016   IR US GUIDE VASC ACCESS RIGHT 06/03/2016 [facility]  . SKIN CANCER EXCISION     basal cell carcinoma L side of face    REVIEW OF SYSTEMS:  A comprehensive review of systems was negative except for: Constitutional: positive for fatigue   PHYSICAL EXAMINATION: General appearance: alert, cooperative and no distress Head: Normocephalic, without obvious abnormality, atraumatic Neck: no adenopathy, no JVD, supple, symmetrical, trachea midline and thyroid not enlarged, symmetric, no tenderness/mass/nodules Lymph nodes: Cervical, supraclavicular, and axillary nodes normal. Resp: clear to auscultation bilaterally Back: symmetric, no curvature. ROM normal. No CVA tenderness. Cardio: regular rate and rhythm, S1, S2 normal, no murmur, click, rub or gallop GI: soft, non-tender; bowel sounds normal; no masses,  no organomegaly Extremities: extremities normal, atraumatic, no cyanosis or edema  ECOG  PERFORMANCE STATUS: 1 - Symptomatic but completely ambulatory  Blood pressure 121/88, pulse 88, temperature 98 F (36.7 C), temperature source Temporal, resp. rate 18, height 5\' 3"  (1.6 m), weight 117 lb 11.2 oz (53.4 kg), SpO2 98 %.  LABORATORY DATA: Lab Results  Component Value Date   WBC 10.6 (H) 08/12/2019   HGB 12.8 08/12/2019   HCT 37.8 08/12/2019   MCV 107.4 (H) 08/12/2019   PLT 269 08/12/2019      Chemistry      Component Value Date/Time   NA 139 07/22/2019 1046   NA 141 08/01/2017 0835   K 3.1 (L) 07/22/2019 1046   K 3.5 08/01/2017 0835   CL 102 07/22/2019 1046   CO2 26 07/22/2019 1046   CO2 25 08/01/2017 0835   BUN 5 (L) 07/22/2019 1046   BUN 8.0 08/01/2017 0835   CREATININE 0.76 07/22/2019 1046   CREATININE 0.9  08/01/2017 0835      Component Value Date/Time   CALCIUM 9.0 07/22/2019 1046   CALCIUM 9.9 08/01/2017 0835   ALKPHOS 137 (H) 07/22/2019 1046   ALKPHOS 142 08/01/2017 0835   AST 19 07/22/2019 1046   AST 15 08/01/2017 0835   ALT 23 07/22/2019 1046   ALT 19 08/01/2017 0835   BILITOT 0.5 07/22/2019 1046   BILITOT 0.35 08/01/2017 0835       RADIOGRAPHIC STUDIES: No results found.  ASSESSMENT AND PLAN:  This is a very pleasant 58 years old white female with extensive stage small cell lung cancer status post systemic chemotherapy with carbo platinum and etoposide for 6 cycles and the patient rotated her treatment well except for chemotherapy-induced anemia and requirement for PRBCs and platelet transfusion. She had significant improvement in her disease with the chemotherapy. She also had prophylactic cranial irradiation. She also underwent stereotactic radiotherapy to progressive right upper lobe pulmonary nodule. The patient has been in observation for close to 2 years. Repeat CT scan of the chest, abdomen and pelvis performed recently showed evidence for disease progression in the abdomen. The patient was started on systemic chemotherapy again with  carboplatin, etoposide and Tecentriq status post 18 cycles.  Starting from cycle #5 the patient is treated with maintenance single agent Tecentriq. The patient continues to tolerate her treatment well with no concerning adverse effect except for mild fatigue. I recommended for her to proceed with cycle #19 today as planned. She will come back for follow-up visit in 3 weeks for evaluation with repeat CT scan of the chest, abdomen pelvis for restaging of her disease. She was advised to call immediately if she has any concerning symptoms in the interval. The patient voices understanding of current disease status and treatment options and is in agreement with the current care plan. All questions were answered. The patient knows to call the clinic with any problems, questions or concerns. We can certainly see the patient much sooner if necessary.  Disclaimer: This note was dictated with voice recognition software. Similar sounding words can inadvertently be transcribed and may not be corrected upon review.

## 2019-08-12 NOTE — Patient Instructions (Signed)
[facility] Discharge Instructions for Patients Receiving Chemotherapy  Today you received the following chemotherapy agents Atezolizumab [facility]).  To help prevent nausea and vomiting after your treatment, we encourage you to take your nausea medication as prescribed.   If you develop nausea and vomiting that is not controlled by your nausea medication, call the clinic.   BELOW ARE SYMPTOMS THAT SHOULD BE REPORTED IMMEDIATELY:  *FEVER GREATER THAN 100.5 F  *CHILLS WITH OR WITHOUT FEVER  NAUSEA AND VOMITING THAT IS NOT CONTROLLED WITH YOUR NAUSEA MEDICATION  *UNUSUAL SHORTNESS OF BREATH  *UNUSUAL BRUISING OR BLEEDING  TENDERNESS IN MOUTH AND THROAT WITH OR WITHOUT PRESENCE OF ULCERS  *URINARY PROBLEMS  *BOWEL PROBLEMS  UNUSUAL RASH Items with * indicate a potential emergency and should be followed up as soon as possible.  Feel free to call the clinic should you have any questions or concerns. The clinic phone number is [phone].  Please show the [place] at check-in to the Emergency Department and triage nurse.  Coronavirus (COVID-19) Are you at risk?  Are you at risk for the Coronavirus (COVID-19)?  To be considered HIGH RISK for Coronavirus (COVID-19), you have to meet the following criteria:  . Traveled to [place], [place], [place], [place] or [place]; or in the [place] to [place], [place], [place], or [place]; and have fever, cough, and shortness of breath within the last 2 weeks of travel OR . Been in close contact with a person diagnosed with COVID-19 within the last 2 weeks and have fever, cough, and shortness of breath . IF YOU DO NOT MEET THESE CRITERIA, YOU ARE CONSIDERED LOW RISK FOR COVID-19.  What to do if you are HIGH RISK for COVID-19?  Marland Kitchen If you are having a medical emergency, call 911. . Seek medical care right away. Before you go to a doctor's office, urgent care or emergency department, call ahead and tell  them about your recent travel, contact with someone diagnosed with COVID-19, and your symptoms. You should receive instructions from your physician's office regarding next steps of care.  . When you arrive at healthcare provider, tell the healthcare staff immediately you have returned from visiting [place], [place], [place], [place] or [place]; or traveled in the [place] to [place], [place], [place], or [place]; in the last two weeks or you have been in close contact with a person diagnosed with COVID-19 in the last 2 weeks.   . Tell the health care staff about your symptoms: fever, cough and shortness of breath. . After you have been seen by a medical provider, you will be either: o Tested for (COVID-19) and discharged home on quarantine except to seek medical care if symptoms worsen, and asked to  - Stay home and avoid contact with others until you get your results (4-5 days)  - Avoid travel on public transportation if possible (such as bus, train, or airplane) or o Sent to the Emergency Department by EMS for evaluation, COVID-19 testing, and possible admission depending on your condition and test results.  What to do if you are LOW RISK for COVID-19?  Reduce your risk of any infection by using the same precautions used for avoiding the common cold or flu:  Marland Kitchen Wash your hands often with soap and warm water for at least 20 seconds.  If soap and water are not readily available, use an alcohol-based hand sanitizer with at least 60% alcohol.  . If coughing or  sneezing, cover your mouth and nose by coughing or sneezing into the elbow areas of your shirt or coat, into a tissue or into your sleeve (not your hands). . Avoid shaking hands with others and consider head nods or verbal greetings only. . Avoid touching your eyes, nose, or mouth with unwashed hands.  . Avoid close contact with people who are sick. . Avoid places or events with large numbers of people in one location, like concerts or  sporting events. . Carefully consider travel plans you have or are making. . If you are planning any travel outside or inside the [place], visit the CDC's Travelers' Health webpage for the latest health notices. . If you have some symptoms but not all symptoms, continue to monitor at home and seek medical attention if your symptoms worsen. . If you are having a medical emergency, call 911.   [place] / e-Visit: [email or web address]         [facility]: [facility]: [phone]                   [facility]: [phone]

## 2019-08-12 NOTE — Patient Instructions (Signed)

## 2019-08-12 NOTE — Progress Notes (Signed)
Oncology Nurse Navigator Documentation  Oncology Nurse Navigator Flowsheets 08/12/2019  Abnormal Finding Date -  Confirmed Diagnosis Date -  Navigator Location [facility]  Referral Date to RadOnc/MedOnc -  Navigator Encounter Type Clinic/MDC/I spoke with patient today at clinic. She is doing well without complaints.  I offered support and encouragement.   Telephone -  Treatment Initiated Date -  Treatment Phase Treatment  Barriers/Navigation Needs -  Education -  Interventions -  Acuity Level 2-Minimal Needs (1-2 Barriers Identified)  Coordination of Care Other  Education Method -  Time Spent with Patient 15

## 2019-08-13 ENCOUNTER — Telehealth: Payer: Self-pay | Admitting: Internal Medicine

## 2019-08-13 NOTE — Telephone Encounter (Signed)
Scheduled per los. Called and spoke with patient confirmed appt  

## 2019-08-31 ENCOUNTER — Ambulatory Visit (HOSPITAL_COMMUNITY)
Admission: RE | Admit: 2019-08-31 | Discharge: 2019-08-31 | Disposition: A | Discharge status: Home / Self Care | Payer: Medicare Other | Source: Ambulatory Visit | Attending: Internal Medicine | Admitting: Internal Medicine

## 2019-08-31 ENCOUNTER — Encounter (HOSPITAL_COMMUNITY): Payer: Self-pay

## 2019-08-31 ENCOUNTER — Other Ambulatory Visit: Payer: Self-pay

## 2019-08-31 DIAGNOSIS — C3491 Malignant neoplasm of unspecified part of right bronchus or lung: Secondary | ICD-10-CM | POA: Diagnosis not present

## 2019-08-31 MED ORDER — SODIUM CHLORIDE (PF) 0.9 % IJ SOLN
INTRAMUSCULAR | Status: AC
  Filled 2019-08-31: qty 50

## 2019-08-31 MED ORDER — IOHEXOL 300 MG/ML  SOLN
100.0000 mL | Freq: Once | INTRAMUSCULAR | Status: AC | PRN
  Administered 2019-08-31: 100 mL via INTRAVENOUS

## 2019-08-31 MED ORDER — HEPARIN SOD (PORK) LOCK FLUSH 100 UNIT/ML IV SOLN
500.0000 [IU] | Freq: Once | INTRAVENOUS | Status: AC
  Administered 2019-08-31: 500 [IU] via INTRAVENOUS

## 2019-08-31 MED ORDER — HEPARIN SOD (PORK) LOCK FLUSH 100 UNIT/ML IV SOLN
INTRAVENOUS | Status: AC
  Filled 2019-08-31: qty 5

## 2019-09-01 NOTE — Progress Notes (Signed)
[facility] OFFICE PROGRESS NOTE  [name], MD [address] [place] [address]  DIAGNOSIS: Extensive stage (T2a, N3, [name], large right anterior mediastinal and supraclavicular lymphadenopathy as well as pancreatic and splenic metastasis diagnosed in September 2017. The patient had disease progression in October 2019.  PRIOR THERAPY:  1) Systemic chemotherapy was carboplatin for AUC of 5 on day 1 and etoposide 100 MG/M2 on days 1, 2 and 3 with Neulasta support. Status post 6 cycles with significant response of her disease. 2) Prophylactic cranial irradiation under the care of Dr. Sondra Come on 12/02/2016. 3) stereotactic radiotherapy to the recurrent right upper lobe pulmonary nodule under the care of Dr. Sondra Come completed November 10, 2017. 4)Retreatment with systemic chemotherapy with carboplatin for AUC of 5 on day 1 and etoposide 100 mg/M2 on days 1, 2 and 3 as well as Tecentriq (Atezolizumab) 1200 mg IV every 3 weeks with Neulasta support. First dose June 22, 2018 for disease recurrence. Status post 5 cycles. Starting from cycle #2 her dose of carboplatin will be reduced to AUC of 4 and etoposide 80 mg/M2 on days 1, 2 and 3 in addition to the regular dose of Tecentriq  CURRENT THERAPY: Maintenance treatment with single agent Tecentriq 1200 mg IV every 3 weeks. Status post15cycles.  INTERVAL HISTORY: Debra Kidd 58 y.o. female returns to the clinic for a follow up visit. The patient is feeling well today without any concerning complaints except for itching without rash. She states the itching is "driving her nuts". She has been using benadryl and hydrocortisone cream which has helped but her itching is extensive over most of her body. She states she is especially itchy on her feet, legs, and back. She denies any rashes. She also notes dry skin, particularly on her legs.   The patient continues to  tolerate treatment with tecentriq well without any adverse effects except for the dry skin/itchiness. Denies any fever, chills, night sweats, or weight loss. Denies any chest pain, shortness of breath, or hemoptysis. She reports her baseline cough. Denies any nausea, vomiting, or diarrhea. She reports occasional constipation. Her last bowel movement was two days ago. She is planning on using miralax when she returns home and did not want to use it prior to coming to the clinic today, which is reasonable. Denies any headache or visual changes. The patient recently had a restaging CT scan. The patient is here today for evaluation and to review her scab results prior to starting cycle # 16  MEDICAL HISTORY: Past Medical History:  Diagnosis Date  . 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 Blood type, Rh positive   . Cancer ([place])   . Chronic pain syndrome   . Depression 08/07/2016  . Encounter for antineoplastic chemotherapy 07/17/2016  . History of external beam radiation therapy 11/19/16-12/02/16   brain 25 Gy in 10 fractions  . Hyperlipidemia   . Hypertension   . Hypertension 08/07/2016  . Osteoporosis   . Small cell lung cancer ([place]) dx'd 05/2016    ALLERGIES:  is allergic to codeine; motrin [ibuprofen]; thiazide-type diuretics; and vicodin [hydrocodone-acetaminophen].  MEDICATIONS:  Current Outpatient Medications  Medication Sig Dispense Refill  . aspirin EC 81 MG tablet Take 1 tablet (81 mg total) by mouth daily. 150 tablet 2  . atorvastatin (LIPITOR) 80 MG tablet Take 1 tablet (80 mg total) by mouth daily at  6 PM. 30 tablet 0  . celecoxib (CELEBREX) 200 MG capsule Take 200 mg by mouth daily as needed for mild pain.     Marland Kitchen clopidogrel (PLAVIX) 75 MG tablet Take 1 tablet (75 mg total) by mouth daily. 30 tablet 0  . diazepam (VALIUM) 5 MG tablet Take 5 mg by mouth 3 (three) times daily as needed for anxiety.   2  . diphenhydrAMINE (BENADRYL) 25 MG tablet Take 25 mg by mouth every 6  (six) hours as needed for itching or allergies.     . hydrOXYzine (ATARAX/VISTARIL) 10 MG tablet Take 1 tablet (10 mg total) by mouth 3 (three) times daily as needed. 90 tablet 0  . lidocaine-prilocaine (EMLA) cream Apply 1 application topically as needed. 30 g 0  . loperamide (IMODIUM A-D) 2 MG tablet Take 2 mg by mouth 4 (four) times daily as needed for diarrhea or loose stools.    . magnesium oxide (MAG-OX) 400 (241.3 Mg) MG tablet Take 1 tablet (400 mg total) by mouth daily. 30 tablet 0  . mirtazapine (REMERON) 15 MG tablet Take 1 tablet (15 mg total) by mouth at bedtime. 30 tablet 1  . oxyCODONE-acetaminophen (PERCOCET) 5-325 MG tablet Take 1 tablet by mouth every 6 (six) hours as needed for severe pain. 30 tablet 0  . polyethylene glycol (MIRALAX / GLYCOLAX) packet Take 17 g by mouth daily. 14 each 0  . potassium chloride 20 MEQ/15ML (10%) SOLN Take 30 mLs (40 mEq total) by mouth daily. 90 mL 0  . prochlorperazine (COMPAZINE) 10 MG tablet Take 1 tablet (10 mg total) by mouth every 6 (six) hours as needed for nausea or vomiting. 30 tablet 0  . senna-docusate (SENOKOT-S) 8.6-50 MG tablet Take 2 tablets by mouth at bedtime. 5 tablet 0  . traMADol (ULTRAM) 50 MG tablet Take 50 mg by mouth every 6 (six) hours as needed for moderate pain.     . Vitamin D, Ergocalciferol, (DRISDOL) 1.25 MG (50000 UT) CAPS capsule Take 1 capsule (50,000 Units total) by mouth every 7 (seven) days. 5 capsule 0   No current facility-administered medications for this visit.   Facility-Administered Medications Ordered in Other Visits  Medication Dose Route Frequency Provider Last Rate Last Admin  . atezolizumab (TECENTRIQ) 1,200 mg in sodium chloride 0.9 % 250 mL chemo infusion  1,200 mg Intravenous Once [name], MD      . heparin lock flush 100 unit/mL  500 Units Intracatheter Once PRN [name], MD      . sodium chloride flush (NS) 0.9 % injection 10 mL  10 mL Intracatheter PRN [name], MD         SURGICAL HISTORY:  Past Surgical History:  Procedure Laterality Date  . ABDOMINAL HYSTERECTOMY  1981  . APPENDECTOMY     at age 14  . EYE SURGERY     left  . IR GENERIC HISTORICAL  06/03/2016   IR FLUORO GUIDE PORT INSERTION RIGHT 06/03/2016 [facility]  . IR GENERIC HISTORICAL  06/03/2016   IR US GUIDE VASC ACCESS RIGHT 06/03/2016 [facility]  . SKIN CANCER EXCISION     basal cell carcinoma L side of face    REVIEW OF SYSTEMS:   Review of Systems  Constitutional: Negative for appetite change, chills, fatigue, fever and unexpected weight change.  HENT: Negative for mouth sores, nosebleeds, sore throat and trouble swallowing.   Eyes: Negative for eye problems and icterus.  Respiratory: Negative for cough, hemoptysis, shortness of breath and  wheezing.  Cardiovascular: Negative for chest pain and leg swelling.  Gastrointestinal: Positive for occasional constipation. Negative for abdominal pain, diarrhea, nausea and vomiting.  Genitourinary: Negative for bladder incontinence, difficulty urinating, dysuria, frequency and hematuria.   Musculoskeletal: Negative for back pain, gait problem, neck pain and neck stiffness.  Skin: Positive for itching. Negative for rash.  Neurological: Negative for dizziness, extremity weakness, gait problem, headaches, [name], depression and sleep disturbance. The patient is not nervous/anxious.     PHYSICAL EXAMINATION:  Blood pressure (!) 112/91, pulse 90, temperature 98.3 F (36.8 C), temperature source Temporal, resp. rate 18, height 5\' 3"  (1.6 m), weight 116 lb 8 oz (52.8 kg), SpO2 98 %.  ECOG PERFORMANCE STATUS: 1 - Symptomatic but completely ambulatory  Physical Exam  Constitutional: Oriented to person, place, and time and well-developed, well-nourished, and in no distress.  HENT:  Head:  Normocephalic and atraumatic.  Mouth/Throat: Oropharynx is clear and moist. No oropharyngeal exudate.  Eyes: Conjunctivae are normal. Right eye exhibits no discharge. Left eye exhibits no discharge. No scleral icterus.  Neck: Normal range of motion. Neck supple.  Cardiovascular: Normal rate, regular rhythm, normal heart sounds and intact distal pulses.   Pulmonary/Chest: Effort normal and breath sounds normal. No respiratory distress. No wheezes. No rales.  Abdominal: Soft. Bowel sounds are normal. Exhibits no distension and no mass. There is no tenderness.  Musculoskeletal: Normal range of motion. Exhibits no edema.  Lymphadenopathy:    No cervical adenopathy.  Neurological: Alert and oriented to person, place, and time. Exhibits normal muscle tone. Gait normal. Coordination normal.  Skin: Skin is warm and dry. No rash noted. Not diaphoretic. No erythema. No pallor.  Psychiatric: Mood, memory and judgment normal.  Vitals reviewed.  LABORATORY DATA: Lab Results  Component Value Date   WBC 9.2 09/02/2019   HGB 12.6 09/02/2019   HCT 37.2 09/02/2019   MCV 107.5 (H) 09/02/2019   PLT 291 09/02/2019      Chemistry      Component Value Date/Time   NA 140 09/02/2019 1121   NA 141 08/01/2017 0835   K 3.2 (L) 09/02/2019 1121   K 3.5 08/01/2017 0835   CL 103 09/02/2019 1121   CO2 27 09/02/2019 1121   CO2 25 08/01/2017 0835   BUN <4 (L) 09/02/2019 1121   BUN 8.0 08/01/2017 0835   CREATININE 0.69 09/02/2019 1121   CREATININE 0.9 08/01/2017 0835      Component Value Date/Time   CALCIUM 8.8 (L) 09/02/2019 1121   CALCIUM 9.9 08/01/2017 0835   ALKPHOS 145 (H) 09/02/2019 1121   ALKPHOS 142 08/01/2017 0835   AST 19 09/02/2019 1121   AST 15 08/01/2017 0835   ALT 18 09/02/2019 1121   ALT 19 08/01/2017 0835   BILITOT 0.4 09/02/2019 1121   BILITOT 0.35 08/01/2017 0835       RADIOGRAPHIC STUDIES:  CT Chest W Contrast  Result Date: 08/31/2019 CLINICAL DATA:  58 year old female with  history of small cell lung cancer extensive stage, status post chemotherapy (complete 10/2016) and radiation therapy (complete 10/2016). Assess treatment response. EXAM: CT CHEST, ABDOMEN, AND PELVIS WITH CONTRAST TECHNIQUE: Multidetector CT imaging of the chest, abdomen and pelvis was performed following the standard protocol during bolus administration of intravenous contrast. CONTRAST:  140mL OMNIPAQUE IOHEXOL 300 MG/ML  SOLN COMPARISON:  CT the chest, abdomen and pelvis 06/08/2019. FINDINGS: CT CHEST FINDINGS Cardiovascular: Heart size is  normal. There is no significant pericardial fluid, thickening or pericardial calcification. There is aortic atherosclerosis, as well as atherosclerosis of the great vessels of the mediastinum and the coronary arteries, including calcified atherosclerotic plaque in the left anterior descending coronary artery. Right internal jugular single-lumen porta cath with tip terminating in the right atrium. Mediastinum/Nodes: No pathologically enlarged mediastinal or hilar lymph nodes. Esophagus is unremarkable in appearance. No axillary lymphadenopathy. Lungs/Pleura: Chronic postradiation changes in the right upper lobe with pleuroparenchymal thickening and architectural distortion in the lateral aspect of the right apex, similar to the prior study. No new suspicious appearing pulmonary nodules or masses are noted. No acute consolidative airspace disease. No pleural effusions. Musculoskeletal: Chronic compression fractures at T6, T7 and T8, most severe at T8 where there is 50% loss of central vertebral body height, similar to prior examination. There are no aggressive appearing lytic or blastic lesions noted in the visualized portions of the skeleton. CT ABDOMEN PELVIS FINDINGS Hepatobiliary: Similar low-attenuation region in segment 4B adjacent to the falciform ligament, stable over numerous prior examinations, again favored to represent focal fatty infiltration. No other suspicious  appearing cystic or solid hepatic lesions. No intra or extrahepatic biliary ductal dilatation. Gallbladder is normal in appearance. Pancreas: No pancreatic mass. No pancreatic ductal dilatation. No pancreatic or peripancreatic fluid collections or inflammatory changes. Spleen: Unremarkable. Adrenals/Urinary Tract: Bilateral kidneys and adrenal glands are normal in appearance. No hydroureteronephrosis. Urinary bladder is normal in appearance. Stomach/Bowel: Normal appearance of the stomach. No pathologic dilatation of small bowel or colon. A few scattered colonic diverticulae are noted, without surrounding inflammatory changes to suggest an acute diverticulitis at this time. The appendix is not confidently identified and may be surgically absent. Regardless, there are no inflammatory changes noted adjacent to the cecum to suggest the presence of an acute appendicitis at this time. Vascular/Lymphatic: Aortic atherosclerosis, without evidence of aneurysm or dissection in the abdominal or pelvic vasculature. No lymphadenopathy noted in the abdomen or pelvis. Reproductive: Status post hysterectomy. Ovaries are not confidently identified may be surgically absent or atrophic. Other: Mild nonspecific haziness in the fat of the central small bowel mesentery, stable. No significant volume of ascites. No pneumoperitoneum. Musculoskeletal: Chronic compression fracture of superior endplate of L1 with 16% loss of anterior vertebral body height, unchanged. Sacralization of L5 (normal anatomical variant) again noted. There are no aggressive appearing lytic or blastic lesions noted in the visualized portions of the skeleton. IMPRESSION: 1. Stable examination demonstrating no definite findings to suggest recurrent or metastatic disease in the chest, abdomen or pelvis. Chronic postradiation mass-like fibrosis in the lateral aspect of the apex of the right upper lobe is unchanged. 2. Aortic atherosclerosis, in addition to left anterior  descending coronary artery disease. Please note that although the presence of coronary artery calcium documents the presence of coronary artery disease, the severity of this disease and any potential stenosis cannot be assessed on this non-gated CT examination. Assessment for potential risk factor modification, dietary therapy or pharmacologic therapy may be warranted, if clinically indicated. 3. Additional incidental findings, as above. Electronically Signed   By: Vinnie Langton M.D.   On: 08/31/2019 11:45   CT Abdomen Pelvis W Contrast  Result Date: 08/31/2019 CLINICAL DATA:  58 year old female with history of small cell lung cancer extensive stage, status post chemotherapy (complete 10/2016) and radiation therapy (complete 10/2016). Assess treatment response. EXAM: CT CHEST, ABDOMEN, AND PELVIS WITH CONTRAST TECHNIQUE: Multidetector CT imaging of the chest, abdomen and pelvis was performed following the standard  protocol during bolus administration of intravenous contrast. CONTRAST:  142mL OMNIPAQUE IOHEXOL 300 MG/ML  SOLN COMPARISON:  CT the chest, abdomen and pelvis 06/08/2019. FINDINGS: CT CHEST FINDINGS Cardiovascular: Heart size is normal. There is no significant pericardial fluid, thickening or pericardial calcification. There is aortic atherosclerosis, as well as atherosclerosis of the great vessels of the mediastinum and the coronary arteries, including calcified atherosclerotic plaque in the left anterior descending coronary artery. Right internal jugular single-lumen porta cath with tip terminating in the right atrium. Mediastinum/Nodes: No pathologically enlarged mediastinal or hilar lymph nodes. Esophagus is unremarkable in appearance. No axillary lymphadenopathy. Lungs/Pleura: Chronic postradiation changes in the right upper lobe with pleuroparenchymal thickening and architectural distortion in the lateral aspect of the right apex, similar to the prior study. No new suspicious appearing  pulmonary nodules or masses are noted. No acute consolidative airspace disease. No pleural effusions. Musculoskeletal: Chronic compression fractures at T6, T7 and T8, most severe at T8 where there is 50% loss of central vertebral body height, similar to prior examination. There are no aggressive appearing lytic or blastic lesions noted in the visualized portions of the skeleton. CT ABDOMEN PELVIS FINDINGS Hepatobiliary: Similar low-attenuation region in segment 4B adjacent to the falciform ligament, stable over numerous prior examinations, again favored to represent focal fatty infiltration. No other suspicious appearing cystic or solid hepatic lesions. No intra or extrahepatic biliary ductal dilatation. Gallbladder is normal in appearance. Pancreas: No pancreatic mass. No pancreatic ductal dilatation. No pancreatic or peripancreatic fluid collections or inflammatory changes. Spleen: Unremarkable. Adrenals/Urinary Tract: Bilateral kidneys and adrenal glands are normal in appearance. No hydroureteronephrosis. Urinary bladder is normal in appearance. Stomach/Bowel: Normal appearance of the stomach. No pathologic dilatation of small bowel or colon. A few scattered colonic diverticulae are noted, without surrounding inflammatory changes to suggest an acute diverticulitis at this time. The appendix is not confidently identified and may be surgically absent. Regardless, there are no inflammatory changes noted adjacent to the cecum to suggest the presence of an acute appendicitis at this time. Vascular/Lymphatic: Aortic atherosclerosis, without evidence of aneurysm or dissection in the abdominal or pelvic vasculature. No lymphadenopathy noted in the abdomen or pelvis. Reproductive: Status post hysterectomy. Ovaries are not confidently identified may be surgically absent or atrophic. Other: Mild nonspecific haziness in the fat of the central small bowel mesentery, stable. No significant volume of ascites. No  pneumoperitoneum. Musculoskeletal: Chronic compression fracture of superior endplate of L1 with 13% loss of anterior vertebral body height, unchanged. Sacralization of L5 (normal anatomical variant) again noted. There are no aggressive appearing lytic or blastic lesions noted in the visualized portions of the skeleton. IMPRESSION: 1. Stable examination demonstrating no definite findings to suggest recurrent or metastatic disease in the chest, abdomen or pelvis. Chronic postradiation mass-like fibrosis in the lateral aspect of the apex of the right upper lobe is unchanged. 2. Aortic atherosclerosis, in addition to left anterior descending coronary artery disease. Please note that although the presence of coronary artery calcium documents the presence of coronary artery disease, the severity of this disease and any potential stenosis cannot be assessed on this non-gated CT examination. Assessment for potential risk factor modification, dietary therapy or pharmacologic therapy may be warranted, if clinically indicated. 3. Additional incidental findings, as above. Electronically Signed   By: Vinnie Langton M.D.   On: 08/31/2019 11:45     ASSESSMENT/PLAN:  This is a very pleasant 58 year old Caucasian female with extensive stage small cell lung cancer. She presented with a right upper lobe  lung mass, large right anterior mediastinal and supraclavicular lymphadenopathy as well as a pancreatic andsplenic metastasis. She was diagnosed in September 2017.  The patient underwent systemic chemotherapy with carboplatin and etoposide. She is status post 6 cycles. She tolerated treatment well except for chemotherapy-induced anemia which required pRBCs and platelet transfusions. She had a significant improvement of her disease with chemotherapy.The patientthenunderwent prophylactic cranial irradiation. She then underwent stereotactic radiotherapy to the right upper lobe and pulmonary nodule.  She had been on  observation for 2 years before showing evidence of disease progression.   She recently hadbeen started on systemic chemotherapy with carboplatin, etoposide, and Tecentriq. Starting from cycle #5, she has been on maintenance single agent Tecentriq. She has been tolerating treatment well. She is status post 15 cycles of single agent Tecentriq.  The patient recently had a restaging CT scan. Dr. Julien Nordmann personally and independently reviewed the scan and discussed the results with the patient today. The scan did not show any evidence of disease progression. Dr. Julien Nordmann recommends that the patient continue with cycle # 16 today of single agent Tecentriq as scheduled.   We will see her back for a follow up visit in 3 weeks for evaluation before starting cycle #17  For her itching, she was given a prescription for atarax 10 mg TID as needed for itching. Discussed that this medication can cause drowsiness and not to drive after taking int. She will also use hydrocortisone cream as well topically if needed.   Her potassium was a little low again today. She would like to try diet modifications for her hypokalemia. She was given a handout on potassium rich food.   The patient was advised to call immediately if she has any concerning symptoms in the interval. The patient voices understanding of current disease status and treatment options and is in agreement with the current care plan. All questions were answered. The patient knows to call the clinic with any problems, questions or concerns. We can certainly see the patient much sooner if necessary    Orders Placed This Encounter  Procedures  . CBC with Differential (Cancer Center Only)    Standing Status:   Standing    Number of Occurrences:   18    Standing Expiration Date:   09/01/2020  . CMP ([facility] only)    Standing Status:   Standing    Number of Occurrences:   18    Standing Expiration Date:   09/01/2020     Tobe Sos  Debra Batty, PA-C 09/02/19  ADDENDUM: Hematology/Oncology Attending: I had a face-to-face encounter with the patient today.  I recommended her care plan.  This is a very pleasant 58 years old white female with extensive stage small cell lung cancer diagnosed and September 2017 and she has been on several chemotherapy regimens and currently on maintenance treatment with Tecentriq every 3 weeks status post 16 cycles. The patient has been tolerating this treatment well with no concerning adverse effect except for pruritus.  She used Benadryl and hydrocortisone cream with minimal improvement. She had repeat CT scan of the chest, abdomen pelvis performed recently.  I personally and independently reviewed the scans and discussed the results with the patient today. Her scan showed no concerning findings for disease progression. I recommended for her to continue her current treatment with Tecentriq with the same dose. For the pruritus, we will add Atarax as needed.  I also gave the patient the option of holding or discontinuing her immunotherapy because of the  severe pruritus but she declined this option.  She would like to continue with her treatment. I will see her back for follow-up visit in 3 weeks for evaluation before starting the next cycle of her treatment. She was advised to call immediately if she has any concerning symptoms in the interval.  Disclaimer: This note was dictated with voice recognition software. Similar sounding words can inadvertently be transcribed and may be missed upon review. Eilleen Kempf, MD 09/02/19

## 2019-09-02 ENCOUNTER — Inpatient Hospital Stay (HOSPITAL_BASED_OUTPATIENT_CLINIC_OR_DEPARTMENT_OTHER): Payer: Medicare Other | Admitting: Physician Assistant

## 2019-09-02 ENCOUNTER — Encounter: Payer: Self-pay | Admitting: Physician Assistant

## 2019-09-02 ENCOUNTER — Inpatient Hospital Stay: Payer: Medicare Other

## 2019-09-02 ENCOUNTER — Other Ambulatory Visit: Payer: Self-pay

## 2019-09-02 VITALS — BP 112/91 | HR 90 | Temp 98.3°F | Resp 18 | Ht 63.0 in | Wt 116.5 lb

## 2019-09-02 DIAGNOSIS — E876 Hypokalemia: Secondary | ICD-10-CM

## 2019-09-02 DIAGNOSIS — I639 Cerebral infarction, unspecified: Secondary | ICD-10-CM

## 2019-09-02 DIAGNOSIS — C3491 Malignant neoplasm of unspecified part of right bronchus or lung: Secondary | ICD-10-CM

## 2019-09-02 DIAGNOSIS — R5383 Other fatigue: Secondary | ICD-10-CM

## 2019-09-02 DIAGNOSIS — L299 Pruritus, unspecified: Secondary | ICD-10-CM

## 2019-09-02 DIAGNOSIS — Z5112 Encounter for antineoplastic immunotherapy: Secondary | ICD-10-CM

## 2019-09-02 LAB — CBC WITH DIFFERENTIAL (CANCER CENTER ONLY)
Abs Immature Granulocytes: 0.03 10*3/uL (ref 0.00–0.07)
Basophils Absolute: 0 10*3/uL (ref 0.0–0.1)
Basophils Relative: 0 %
Eosinophils Absolute: 0.3 10*3/uL (ref 0.0–0.5)
Eosinophils Relative: 3 %
HCT: 37.2 % (ref 36.0–46.0)
Hemoglobin: 12.6 g/dL (ref 12.0–15.0)
Immature Granulocytes: 0 %
Lymphocytes Relative: 19 %
Lymphs Abs: 1.8 10*3/uL (ref 0.7–4.0)
MCH: 36.4 pg — ABNORMAL HIGH (ref 26.0–34.0)
MCHC: 33.9 g/dL (ref 30.0–36.0)
MCV: 107.5 fL — ABNORMAL HIGH (ref 80.0–100.0)
Monocytes Absolute: 0.5 10*3/uL (ref 0.1–1.0)
Monocytes Relative: 6 %
Neutro Abs: 6.6 10*3/uL (ref 1.7–7.7)
Neutrophils Relative %: 72 %
Platelet Count: 291 10*3/uL (ref 150–400)
RBC: 3.46 MIL/uL — ABNORMAL LOW (ref 3.87–5.11)
RDW: 12.6 % (ref 11.5–15.5)
WBC Count: 9.2 10*3/uL (ref 4.0–10.5)
nRBC: 0 % (ref 0.0–0.2)

## 2019-09-02 LAB — CMP (CANCER CENTER ONLY)
ALT: 18 U/L (ref 0–44)
AST: 19 U/L (ref 15–41)
Albumin: 3.4 g/dL — ABNORMAL LOW (ref 3.5–5.0)
Alkaline Phosphatase: 145 U/L — ABNORMAL HIGH (ref 38–126)
Anion gap: 10 (ref 5–15)
BUN: <4 mg/dL — ABNORMAL LOW (ref 6–20)
CO2: 27 mmol/L (ref 22–32)
Calcium: 8.8 mg/dL — ABNORMAL LOW (ref 8.9–10.3)
Chloride: 103 mmol/L (ref 98–111)
Creatinine: 0.69 mg/dL (ref 0.44–1.00)
GFR, Est AFR Am: >60 mL/min (ref >60)
GFR, Estimated: >60 mL/min (ref >60)
Glucose, Bld: 101 mg/dL — ABNORMAL HIGH (ref 70–99)
Potassium: 3.2 mmol/L — ABNORMAL LOW (ref 3.5–5.1)
Sodium: 140 mmol/L (ref 135–145)
Total Bilirubin: 0.4 mg/dL (ref 0.3–1.2)
Total Protein: 6.7 g/dL (ref 6.5–8.1)

## 2019-09-02 LAB — TSH: TSH: 0.338 u[IU]/mL (ref 0.308–3.960)

## 2019-09-02 MED ORDER — SODIUM CHLORIDE 0.9% FLUSH
10.0000 mL | INTRAVENOUS | Status: DC | PRN
  Administered 2019-09-02: 10 mL
  Filled 2019-09-02: qty 10

## 2019-09-02 MED ORDER — HYDROXYZINE HCL 10 MG PO TABS: 10.0000 mg | ORAL_TABLET | Freq: Three times a day (TID) | ORAL | 0 refills | Status: DC | PRN

## 2019-09-02 MED ORDER — SODIUM CHLORIDE 0.9 % IV SOLN
Freq: Once | INTRAVENOUS | Status: AC
  Filled 2019-09-02: qty 250

## 2019-09-02 MED ORDER — SODIUM CHLORIDE 0.9 % IV SOLN
1200.0000 mg | Freq: Once | INTRAVENOUS | Status: AC
  Administered 2019-09-02: 1200 mg via INTRAVENOUS
  Filled 2019-09-02: qty 20

## 2019-09-02 MED ORDER — HEPARIN SOD (PORK) LOCK FLUSH 100 UNIT/ML IV SOLN
500.0000 [IU] | Freq: Once | INTRAVENOUS | Status: AC | PRN
  Administered 2019-09-02: 500 [IU]
  Filled 2019-09-02: qty 5

## 2019-09-02 NOTE — Patient Instructions (Signed)

## 2019-09-02 NOTE — Patient Instructions (Signed)
[facility] Discharge Instructions for Patients Receiving Chemotherapy  Today you received the following chemotherapy agents Atezolizumab [place]).  To help prevent nausea and vomiting after your treatment, we encourage you to take your nausea medication as prescribed.   If you develop nausea and vomiting that is not controlled by your nausea medication, call the clinic.   BELOW ARE SYMPTOMS THAT SHOULD BE REPORTED IMMEDIATELY:  *FEVER GREATER THAN 100.5 F  *CHILLS WITH OR WITHOUT FEVER  NAUSEA AND VOMITING THAT IS NOT CONTROLLED WITH YOUR NAUSEA MEDICATION  *UNUSUAL SHORTNESS OF BREATH  *UNUSUAL BRUISING OR BLEEDING  TENDERNESS IN MOUTH AND THROAT WITH OR WITHOUT PRESENCE OF ULCERS  *URINARY PROBLEMS  *BOWEL PROBLEMS  UNUSUAL RASH Items with * indicate a potential emergency and should be followed up as soon as possible.  Feel free to call the clinic should you have any questions or concerns. The clinic phone number is [phone].  Please show the [facility] at check-in to the Emergency Department and triage nurse.  Coronavirus (COVID-19) Are you at risk?  Are you at risk for the Coronavirus (COVID-19)?  To be considered HIGH RISK for Coronavirus (COVID-19), you have to meet the following criteria:  . Traveled to [place], [place], [place], [place] or [place]; or in the [place] to [place], [place], [place], or [place]; and have fever, cough, and shortness of breath within the last 2 weeks of travel OR . Been in close contact with a person diagnosed with COVID-19 within the last 2 weeks and have fever, cough, and shortness of breath . IF YOU DO NOT MEET THESE CRITERIA, YOU ARE CONSIDERED LOW RISK FOR COVID-19.  What to do if you are HIGH RISK for COVID-19?  Marland Kitchen If you are having a medical emergency, call 911. . Seek medical care right away. Before you go to a doctor's office, urgent care or emergency department, call ahead and tell  them about your recent travel, contact with someone diagnosed with COVID-19, and your symptoms. You should receive instructions from your physician's office regarding next steps of care.  . When you arrive at healthcare provider, tell the healthcare staff immediately you have returned from visiting [place], [place], [place], [place] or [place]; or traveled in the [place] to [place], [place], [place], or [place]; in the last two weeks or you have been in close contact with a person diagnosed with COVID-19 in the last 2 weeks.   . Tell the health care staff about your symptoms: fever, cough and shortness of breath. . After you have been seen by a medical provider, you will be either: o Tested for (COVID-19) and discharged home on quarantine except to seek medical care if symptoms worsen, and asked to  - Stay home and avoid contact with others until you get your results (4-5 days)  - Avoid travel on public transportation if possible (such as bus, train, or airplane) or o Sent to the Emergency Department by EMS for evaluation, COVID-19 testing, and possible admission depending on your condition and test results.  What to do if you are LOW RISK for COVID-19?  Reduce your risk of any infection by using the same precautions used for avoiding the common cold or flu:  Marland Kitchen Wash your hands often with soap and warm water for at least 20 seconds.  If soap and water are not readily available, use an alcohol-based hand sanitizer with at least 60% alcohol.  . If coughing or  sneezing, cover your mouth and nose by coughing or sneezing into the elbow areas of your shirt or coat, into a tissue or into your sleeve (not your hands). . Avoid shaking hands with others and consider head nods or verbal greetings only. . Avoid touching your eyes, nose, or mouth with unwashed hands.  . Avoid close contact with people who are sick. . Avoid places or events with large numbers of people in one location, like concerts or  sporting events. . Carefully consider travel plans you have or are making. . If you are planning any travel outside or inside the [place], visit the CDC's Travelers' Health webpage for the latest health notices. . If you have some symptoms but not all symptoms, continue to monitor at home and seek medical attention if your symptoms worsen. . If you are having a medical emergency, call 911.   [facility] / e-Visit: [email or web address]         [facility]: [facility]: [phone]                   [facility]: [phone]

## 2019-09-23 ENCOUNTER — Encounter: Payer: Self-pay | Admitting: Internal Medicine

## 2019-09-23 ENCOUNTER — Other Ambulatory Visit: Payer: Self-pay

## 2019-09-23 ENCOUNTER — Inpatient Hospital Stay: Payer: Medicare Other

## 2019-09-23 ENCOUNTER — Inpatient Hospital Stay: Payer: Medicare Other | Attending: Internal Medicine | Admitting: Internal Medicine

## 2019-09-23 VITALS — BP 107/84 | HR 86 | Temp 98.5°F | Resp 16 | Ht 63.0 in | Wt 116.1 lb

## 2019-09-23 DIAGNOSIS — R5383 Other fatigue: Secondary | ICD-10-CM

## 2019-09-23 DIAGNOSIS — C3411 Malignant neoplasm of upper lobe, right bronchus or lung: Secondary | ICD-10-CM | POA: Diagnosis not present

## 2019-09-23 DIAGNOSIS — C3491 Malignant neoplasm of unspecified part of right bronchus or lung: Secondary | ICD-10-CM

## 2019-09-23 DIAGNOSIS — Z5112 Encounter for antineoplastic immunotherapy: Secondary | ICD-10-CM

## 2019-09-23 DIAGNOSIS — L600 Ingrowing nail: Secondary | ICD-10-CM | POA: Insufficient documentation

## 2019-09-23 DIAGNOSIS — E785 Hyperlipidemia, unspecified: Secondary | ICD-10-CM | POA: Insufficient documentation

## 2019-09-23 DIAGNOSIS — F329 Major depressive disorder, single episode, unspecified: Secondary | ICD-10-CM | POA: Insufficient documentation

## 2019-09-23 DIAGNOSIS — I10 Essential (primary) hypertension: Secondary | ICD-10-CM

## 2019-09-23 DIAGNOSIS — Z9221 Personal history of antineoplastic chemotherapy: Secondary | ICD-10-CM | POA: Diagnosis not present

## 2019-09-23 DIAGNOSIS — C7889 Secondary malignant neoplasm of other digestive organs: Secondary | ICD-10-CM | POA: Insufficient documentation

## 2019-09-23 DIAGNOSIS — Z79899 Other long term (current) drug therapy: Secondary | ICD-10-CM | POA: Diagnosis not present

## 2019-09-23 DIAGNOSIS — Z85828 Personal history of other malignant neoplasm of skin: Secondary | ICD-10-CM | POA: Diagnosis not present

## 2019-09-23 DIAGNOSIS — C787 Secondary malignant neoplasm of liver and intrahepatic bile duct: Secondary | ICD-10-CM | POA: Diagnosis not present

## 2019-09-23 LAB — CBC WITH DIFFERENTIAL (CANCER CENTER ONLY)
Abs Immature Granulocytes: 0.03 10*3/uL (ref 0.00–0.07)
Basophils Absolute: 0 10*3/uL (ref 0.0–0.1)
Basophils Relative: 0 %
Eosinophils Absolute: 0.3 10*3/uL (ref 0.0–0.5)
Eosinophils Relative: 4 %
HCT: 36.4 % (ref 36.0–46.0)
Hemoglobin: 12.3 g/dL (ref 12.0–15.0)
Immature Granulocytes: 0 %
Lymphocytes Relative: 20 %
Lymphs Abs: 1.8 10*3/uL (ref 0.7–4.0)
MCH: 36.8 pg — ABNORMAL HIGH (ref 26.0–34.0)
MCHC: 33.8 g/dL (ref 30.0–36.0)
MCV: 109 fL — ABNORMAL HIGH (ref 80.0–100.0)
Monocytes Absolute: 0.5 10*3/uL (ref 0.1–1.0)
Monocytes Relative: 6 %
Neutro Abs: 6.4 10*3/uL (ref 1.7–7.7)
Neutrophils Relative %: 70 %
Platelet Count: 282 10*3/uL (ref 150–400)
RBC: 3.34 MIL/uL — ABNORMAL LOW (ref 3.87–5.11)
RDW: 12.9 % (ref 11.5–15.5)
WBC Count: 9.1 10*3/uL (ref 4.0–10.5)
nRBC: 0 % (ref 0.0–0.2)

## 2019-09-23 LAB — CMP (CANCER CENTER ONLY)
ALT: 16 U/L (ref 0–44)
AST: 16 U/L (ref 15–41)
Albumin: 3.3 g/dL — ABNORMAL LOW (ref 3.5–5.0)
Alkaline Phosphatase: 128 U/L — ABNORMAL HIGH (ref 38–126)
Anion gap: 10 (ref 5–15)
BUN: 5 mg/dL — ABNORMAL LOW (ref 6–20)
CO2: 26 mmol/L (ref 22–32)
Calcium: 8.7 mg/dL — ABNORMAL LOW (ref 8.9–10.3)
Chloride: 105 mmol/L (ref 98–111)
Creatinine: 0.7 mg/dL (ref 0.44–1.00)
GFR, Est AFR Am: >60 mL/min (ref >60)
GFR, Estimated: >60 mL/min (ref >60)
Glucose, Bld: 97 mg/dL (ref 70–99)
Potassium: 3.4 mmol/L — ABNORMAL LOW (ref 3.5–5.1)
Sodium: 141 mmol/L (ref 135–145)
Total Bilirubin: 0.3 mg/dL (ref 0.3–1.2)
Total Protein: 6.4 g/dL — ABNORMAL LOW (ref 6.5–8.1)

## 2019-09-23 LAB — TSH: TSH: 1.845 u[IU]/mL (ref 0.308–3.960)

## 2019-09-23 MED ORDER — HEPARIN SOD (PORK) LOCK FLUSH 100 UNIT/ML IV SOLN
500.0000 [IU] | Freq: Once | INTRAVENOUS | Status: AC | PRN
  Administered 2019-09-23: 500 [IU]
  Filled 2019-09-23: qty 5

## 2019-09-23 MED ORDER — SODIUM CHLORIDE 0.9 % IV SOLN
Freq: Once | INTRAVENOUS | Status: AC
  Filled 2019-09-23: qty 250

## 2019-09-23 MED ORDER — SODIUM CHLORIDE 0.9% FLUSH
10.0000 mL | INTRAVENOUS | Status: DC | PRN
  Administered 2019-09-23: 10 mL
  Filled 2019-09-23: qty 10

## 2019-09-23 MED ORDER — SODIUM CHLORIDE 0.9 % IV SOLN
1200.0000 mg | Freq: Once | INTRAVENOUS | Status: AC
  Administered 2019-09-23: 1200 mg via INTRAVENOUS
  Filled 2019-09-23: qty 20

## 2019-09-23 NOTE — Progress Notes (Signed)
[facility] [phone]   [phone]  OFFICE PROGRESS NOTE  [name], MD [address] [place] [address]  DIAGNOSIS: Extensive stage (T2a, N3, [name], large right anterior mediastinal and supraclavicular lymphadenopathy as well as pancreatic and splenic metastasis diagnosed in September 2017.  The patient had disease progression in October 2019.  PRIOR THERAPY:  1) Systemic chemotherapy was carboplatin for AUC of 5 on day 1 and etoposide 100 MG/M2 on days 1, 2 and 3 with Neulasta support. Status post 6 cycles with significant response of her disease. 2) Prophylactic cranial irradiation under the care of Dr. Sondra Come on 12/02/2016. 3) stereotactic radiotherapy to the recurrent right upper lobe pulmonary nodule under the care of Dr. Sondra Come completed November 10, 2017. 4) Retreatment with systemic chemotherapy with carboplatin for AUC of 5 on day 1 and etoposide 100 mg/M2 on days 1, 2 and 3 as well as Tecentriq (Atezolizumab) 1200 mg IV every 3 weeks with Neulasta support.  First dose June 22, 2018 for disease recurrence.  Status post 5 cycles.  Starting from cycle #2 her dose of carboplatin will be reduced to AUC of 4 and etoposide 80 mg/M2 on days 1, 2 and 3 in addition to the regular dose of Tecentriq.  CURRENT THERAPY: Maintenance treatment with single agent Tecentriq 1200 mg IV every 3 weeks.  Status post 15 cycles.  INTERVAL HISTORY: Debra Kidd 59 y.o. female returns to the clinic today for follow-up visit.  The patient is feeling fine today with no concerning complaints.  She denied having any chest pain, shortness of breath, cough or hemoptysis.  She denied having any nausea, vomiting, diarrhea or constipation.  She has no headache or visual changes.  She has left ingrown nail of the big toe and she wants referral to podiatrist.  She denied having any headache or visual changes.   She continues to tolerate her treatment with Tecentriq fairly well.  She is here today for evaluation before starting cycle #16.  MEDICAL HISTORY: Past Medical History:  Diagnosis Date  . 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 Blood type, Rh positive   . Cancer ([facility])   . Chronic pain syndrome   . Depression 08/07/2016  . Encounter for antineoplastic chemotherapy 07/17/2016  . History of external beam radiation therapy 11/19/16-12/02/16   brain 25 Gy in 10 fractions  . Hyperlipidemia   . Hypertension   . Hypertension 08/07/2016  . Osteoporosis   . Small cell lung cancer ([facility]) dx'd 05/2016    ALLERGIES:  is allergic to codeine; motrin [ibuprofen]; thiazide-type diuretics; and vicodin [hydrocodone-acetaminophen].  MEDICATIONS:  Current Outpatient Medications  Medication Sig Dispense Refill  . aspirin EC 81 MG tablet Take 1 tablet (81 mg total) by mouth daily. 150 tablet 2  . atorvastatin (LIPITOR) 80 MG tablet Take 1 tablet (80 mg total) by mouth daily at 6 PM. 30 tablet 0  . celecoxib (CELEBREX) 200 MG capsule Take 200 mg by mouth daily as needed for mild pain.     Marland Kitchen clopidogrel (PLAVIX) 75 MG tablet Take 1 tablet (75 mg total) by mouth daily. 30 tablet 0  . diazepam (VALIUM) 5 MG tablet Take 5 mg by mouth 3 (three) times daily as needed for anxiety.   2  . diphenhydrAMINE (BENADRYL) 25 MG tablet Take 25 mg by mouth every 6 (six)  hours as needed for itching or allergies.     . hydrOXYzine (ATARAX/VISTARIL) 10 MG tablet Take 1 tablet (10 mg total) by mouth 3 (three) times daily as needed. 90 tablet 0  . lidocaine-prilocaine (EMLA) cream Apply 1 application topically as needed. 30 g 0  . loperamide (IMODIUM A-D) 2 MG tablet Take 2 mg by mouth 4 (four) times daily as needed for diarrhea or loose stools.    . magnesium oxide (MAG-OX) 400 (241.3 Mg) MG tablet Take 1 tablet (400 mg total) by mouth daily. 30 tablet 0  . mirtazapine (REMERON) 15 MG tablet Take 1 tablet (15 mg total)  by mouth at bedtime. 30 tablet 1  . oxyCODONE-acetaminophen (PERCOCET) 5-325 MG tablet Take 1 tablet by mouth every 6 (six) hours as needed for severe pain. 30 tablet 0  . polyethylene glycol (MIRALAX / GLYCOLAX) packet Take 17 g by mouth daily. 14 each 0  . potassium chloride 20 MEQ/15ML (10%) SOLN Take 30 mLs (40 mEq total) by mouth daily. 90 mL 0  . prochlorperazine (COMPAZINE) 10 MG tablet Take 1 tablet (10 mg total) by mouth every 6 (six) hours as needed for nausea or vomiting. 30 tablet 0  . senna-docusate (SENOKOT-S) 8.6-50 MG tablet Take 2 tablets by mouth at bedtime. 5 tablet 0  . traMADol (ULTRAM) 50 MG tablet Take 50 mg by mouth every 6 (six) hours as needed for moderate pain.     . Vitamin D, Ergocalciferol, (DRISDOL) 1.25 MG (50000 UT) CAPS capsule Take 1 capsule (50,000 Units total) by mouth every 7 (seven) days. 5 capsule 0   No current facility-administered medications for this visit.    SURGICAL HISTORY:  Past Surgical History:  Procedure Laterality Date  . ABDOMINAL HYSTERECTOMY  1981  . APPENDECTOMY     at age 51  . EYE SURGERY     left  . IR GENERIC HISTORICAL  06/03/2016   IR FLUORO GUIDE PORT INSERTION RIGHT 06/03/2016 [facility]INTER[facility] RAD  . IR GENERIC HISTORICAL  06/03/2016   IR US GUIDE VASC ACCESS RIGHT 06/03/2016 [facility]INTER[facility]  . SKIN CANCER EXCISION     basal cell carcinoma L side of face    REVIEW OF SYSTEMS:  A comprehensive review of systems was negative.   PHYSICAL EXAMINATION: General appearance: alert, cooperative and no distress Head: Normocephalic, without obvious abnormality, atraumatic Neck: no adenopathy, no JVD, supple, symmetrical, trachea midline and thyroid not enlarged, symmetric, no tenderness/mass/nodules Lymph nodes: Cervical, supraclavicular, and axillary nodes normal. Resp: clear to auscultation bilaterally Back: symmetric, no curvature. ROM normal. No CVA tenderness. Cardio: regular rate and rhythm, S1, S2 normal, no murmur, click, rub or  gallop GI: soft, non-tender; bowel sounds normal; no masses,  no organomegaly Extremities: extremities normal, atraumatic, no cyanosis or edema  ECOG PERFORMANCE STATUS: 1 - Symptomatic but completely ambulatory  Blood pressure 107/84, pulse 86, temperature 98.5 F (36.9 C), temperature source Temporal, resp. rate 16, height 5\' 3"  (1.6 m), weight 116 lb 1.6 oz (52.7 kg), SpO2 98 %.  LABORATORY DATA: Lab Results  Component Value Date   WBC 9.1 09/23/2019   HGB 12.3 09/23/2019   HCT 36.4 09/23/2019   MCV 109.0 (H) 09/23/2019   PLT 282 09/23/2019      Chemistry      Component Value Date/Time   NA 141 09/23/2019 0950   NA 141 08/01/2017 0835   K 3.4 (L) 09/23/2019 0950   K 3.5 08/01/2017 0835   CL 105 09/23/2019 0950  CO2 26 09/23/2019 0950   CO2 25 08/01/2017 0835   BUN 5 (L) 09/23/2019 0950   BUN 8.0 08/01/2017 0835   CREATININE 0.70 09/23/2019 0950   CREATININE 0.9 08/01/2017 0835      Component Value Date/Time   CALCIUM 8.7 (L) 09/23/2019 0950   CALCIUM 9.9 08/01/2017 0835   ALKPHOS 128 (H) 09/23/2019 0950   ALKPHOS 142 08/01/2017 0835   AST 16 09/23/2019 0950   AST 15 08/01/2017 0835   ALT 16 09/23/2019 0950   ALT 19 08/01/2017 0835   BILITOT 0.3 09/23/2019 0950   BILITOT 0.35 08/01/2017 0835       RADIOGRAPHIC STUDIES: CT Chest W Contrast  Result Date: 08/31/2019 CLINICAL DATA:  59 year old female with history of small cell lung cancer extensive stage, status post chemotherapy (complete 10/2016) and radiation therapy (complete 10/2016). Assess treatment response. EXAM: CT CHEST, ABDOMEN, AND PELVIS WITH CONTRAST TECHNIQUE: Multidetector CT imaging of the chest, abdomen and pelvis was performed following the standard protocol during bolus administration of intravenous contrast. CONTRAST:  135mL OMNIPAQUE IOHEXOL 300 MG/ML  SOLN COMPARISON:  CT the chest, abdomen and pelvis 06/08/2019. FINDINGS: CT CHEST FINDINGS Cardiovascular: Heart size is normal. There is no  significant pericardial fluid, thickening or pericardial calcification. There is aortic atherosclerosis, as well as atherosclerosis of the great vessels of the mediastinum and the coronary arteries, including calcified atherosclerotic plaque in the left anterior descending coronary artery. Right internal jugular single-lumen porta cath with tip terminating in the right atrium. Mediastinum/Nodes: No pathologically enlarged mediastinal or hilar lymph nodes. Esophagus is unremarkable in appearance. No axillary lymphadenopathy. Lungs/Pleura: Chronic postradiation changes in the right upper lobe with pleuroparenchymal thickening and architectural distortion in the lateral aspect of the right apex, similar to the prior study. No new suspicious appearing pulmonary nodules or masses are noted. No acute consolidative airspace disease. No pleural effusions. Musculoskeletal: Chronic compression fractures at T6, T7 and T8, most severe at T8 where there is 50% loss of central vertebral body height, similar to prior examination. There are no aggressive appearing lytic or blastic lesions noted in the visualized portions of the skeleton. CT ABDOMEN PELVIS FINDINGS Hepatobiliary: Similar low-attenuation region in segment 4B adjacent to the falciform ligament, stable over numerous prior examinations, again favored to represent focal fatty infiltration. No other suspicious appearing cystic or solid hepatic lesions. No intra or extrahepatic biliary ductal dilatation. Gallbladder is normal in appearance. Pancreas: No pancreatic mass. No pancreatic ductal dilatation. No pancreatic or peripancreatic fluid collections or inflammatory changes. Spleen: Unremarkable. Adrenals/Urinary Tract: Bilateral kidneys and adrenal glands are normal in appearance. No hydroureteronephrosis. Urinary bladder is normal in appearance. Stomach/Bowel: Normal appearance of the stomach. No pathologic dilatation of small bowel or colon. A few scattered colonic  diverticulae are noted, without surrounding inflammatory changes to suggest an acute diverticulitis at this time. The appendix is not confidently identified and may be surgically absent. Regardless, there are no inflammatory changes noted adjacent to the cecum to suggest the presence of an acute appendicitis at this time. Vascular/Lymphatic: Aortic atherosclerosis, without evidence of aneurysm or dissection in the abdominal or pelvic vasculature. No lymphadenopathy noted in the abdomen or pelvis. Reproductive: Status post hysterectomy. Ovaries are not confidently identified may be surgically absent or atrophic. Other: Mild nonspecific haziness in the fat of the central small bowel mesentery, stable. No significant volume of ascites. No pneumoperitoneum. Musculoskeletal: Chronic compression fracture of superior endplate of L1 with 27% loss of anterior vertebral body height, unchanged. Sacralization of L5 (  normal anatomical variant) again noted. There are no aggressive appearing lytic or blastic lesions noted in the visualized portions of the skeleton. IMPRESSION: 1. Stable examination demonstrating no definite findings to suggest recurrent or metastatic disease in the chest, abdomen or pelvis. Chronic postradiation mass-like fibrosis in the lateral aspect of the apex of the right upper lobe is unchanged. 2. Aortic atherosclerosis, in addition to left anterior descending coronary artery disease. Please note that although the presence of coronary artery calcium documents the presence of coronary artery disease, the severity of this disease and any potential stenosis cannot be assessed on this non-gated CT examination. Assessment for potential risk factor modification, dietary therapy or pharmacologic therapy may be warranted, if clinically indicated. 3. Additional incidental findings, as above. Electronically Signed   By: Vinnie Langton M.D.   On: 08/31/2019 11:45   CT Abdomen Pelvis W Contrast  Result Date:  08/31/2019 CLINICAL DATA:  59 year old female with history of small cell lung cancer extensive stage, status post chemotherapy (complete 10/2016) and radiation therapy (complete 10/2016). Assess treatment response. EXAM: CT CHEST, ABDOMEN, AND PELVIS WITH CONTRAST TECHNIQUE: Multidetector CT imaging of the chest, abdomen and pelvis was performed following the standard protocol during bolus administration of intravenous contrast. CONTRAST:  188mL OMNIPAQUE IOHEXOL 300 MG/ML  SOLN COMPARISON:  CT the chest, abdomen and pelvis 06/08/2019. FINDINGS: CT CHEST FINDINGS Cardiovascular: Heart size is normal. There is no significant pericardial fluid, thickening or pericardial calcification. There is aortic atherosclerosis, as well as atherosclerosis of the great vessels of the mediastinum and the coronary arteries, including calcified atherosclerotic plaque in the left anterior descending coronary artery. Right internal jugular single-lumen porta cath with tip terminating in the right atrium. Mediastinum/Nodes: No pathologically enlarged mediastinal or hilar lymph nodes. Esophagus is unremarkable in appearance. No axillary lymphadenopathy. Lungs/Pleura: Chronic postradiation changes in the right upper lobe with pleuroparenchymal thickening and architectural distortion in the lateral aspect of the right apex, similar to the prior study. No new suspicious appearing pulmonary nodules or masses are noted. No acute consolidative airspace disease. No pleural effusions. Musculoskeletal: Chronic compression fractures at T6, T7 and T8, most severe at T8 where there is 50% loss of central vertebral body height, similar to prior examination. There are no aggressive appearing lytic or blastic lesions noted in the visualized portions of the skeleton. CT ABDOMEN PELVIS FINDINGS Hepatobiliary: Similar low-attenuation region in segment 4B adjacent to the falciform ligament, stable over numerous prior examinations, again favored to  represent focal fatty infiltration. No other suspicious appearing cystic or solid hepatic lesions. No intra or extrahepatic biliary ductal dilatation. Gallbladder is normal in appearance. Pancreas: No pancreatic mass. No pancreatic ductal dilatation. No pancreatic or peripancreatic fluid collections or inflammatory changes. Spleen: Unremarkable. Adrenals/Urinary Tract: Bilateral kidneys and adrenal glands are normal in appearance. No hydroureteronephrosis. Urinary bladder is normal in appearance. Stomach/Bowel: Normal appearance of the stomach. No pathologic dilatation of small bowel or colon. A few scattered colonic diverticulae are noted, without surrounding inflammatory changes to suggest an acute diverticulitis at this time. The appendix is not confidently identified and may be surgically absent. Regardless, there are no inflammatory changes noted adjacent to the cecum to suggest the presence of an acute appendicitis at this time. Vascular/Lymphatic: Aortic atherosclerosis, without evidence of aneurysm or dissection in the abdominal or pelvic vasculature. No lymphadenopathy noted in the abdomen or pelvis. Reproductive: Status post hysterectomy. Ovaries are not confidently identified may be surgically absent or atrophic. Other: Mild nonspecific haziness in the fat of the  central small bowel mesentery, stable. No significant volume of ascites. No pneumoperitoneum. Musculoskeletal: Chronic compression fracture of superior endplate of L1 with 38% loss of anterior vertebral body height, unchanged. Sacralization of L5 (normal anatomical variant) again noted. There are no aggressive appearing lytic or blastic lesions noted in the visualized portions of the skeleton. IMPRESSION: 1. Stable examination demonstrating no definite findings to suggest recurrent or metastatic disease in the chest, abdomen or pelvis. Chronic postradiation mass-like fibrosis in the lateral aspect of the apex of the right upper lobe is unchanged.  2. Aortic atherosclerosis, in addition to left anterior descending coronary artery disease. Please note that although the presence of coronary artery calcium documents the presence of coronary artery disease, the severity of this disease and any potential stenosis cannot be assessed on this non-gated CT examination. Assessment for potential risk factor modification, dietary therapy or pharmacologic therapy may be warranted, if clinically indicated. 3. Additional incidental findings, as above. Electronically Signed   By: Vinnie Langton M.D.   On: 08/31/2019 11:45    ASSESSMENT AND PLAN:  This is a very pleasant 59 years old white female with extensive stage small cell lung cancer status post systemic chemotherapy with carbo platinum and etoposide for 6 cycles and the patient rotated her treatment well except for chemotherapy-induced anemia and requirement for PRBCs and platelet transfusion. She had significant improvement in her disease with the chemotherapy. She also had prophylactic cranial irradiation. She also underwent stereotactic radiotherapy to progressive right upper lobe pulmonary nodule. The patient has been in observation for close to 2 years. Repeat CT scan of the chest, abdomen and pelvis performed recently showed evidence for disease progression in the abdomen. The patient was started on systemic chemotherapy again with carboplatin, etoposide and Tecentriq status post 20 cycles.  Starting from cycle #5 the patient is treated with maintenance single agent Tecentriq. The patient has been tolerating this treatment well with no concerning adverse effects. I recommended for her to proceed with cycle #20 one of her treatment today as planned. For the left big toe ingrown nail, I will refer the patient to a podiatrist in [place] for evaluation and management of her condition. The patient will come back for follow-up visit in 3 weeks for evaluation before the next cycle of her treatment. She  was advised to call immediately if she has any concerning symptoms in the interval. The patient voices understanding of current disease status and treatment options and is in agreement with the current care plan. All questions were answered. The patient knows to call the clinic with any problems, questions or concerns. We can certainly see the patient much sooner if necessary.  Disclaimer: This note was dictated with voice recognition software. Similar sounding words can inadvertently be transcribed and may not be corrected upon review.

## 2019-09-23 NOTE — Patient Instructions (Signed)
[facility] Discharge Instructions for Patients Receiving Chemotherapy  Today you received the following chemotherapy agents Tecentriq.  To help prevent nausea and vomiting after your treatment, we encourage you to take your nausea medication as directed.   If you develop nausea and vomiting that is not controlled by your nausea medication, call the clinic.   BELOW ARE SYMPTOMS THAT SHOULD BE REPORTED IMMEDIATELY:  *FEVER GREATER THAN 100.5 F  *CHILLS WITH OR WITHOUT FEVER  NAUSEA AND VOMITING THAT IS NOT CONTROLLED WITH YOUR NAUSEA MEDICATION  *UNUSUAL SHORTNESS OF BREATH  *UNUSUAL BRUISING OR BLEEDING  TENDERNESS IN MOUTH AND THROAT WITH OR WITHOUT PRESENCE OF ULCERS  *URINARY PROBLEMS  *BOWEL PROBLEMS  UNUSUAL RASH Items with * indicate a potential emergency and should be followed up as soon as possible.  Feel free to call the clinic you have any questions or concerns. The clinic phone number is [phone].  Please show the [facility] at check-in to the Emergency Department and triage nurse.

## 2019-10-14 ENCOUNTER — Encounter: Payer: Self-pay | Admitting: Internal Medicine

## 2019-10-14 ENCOUNTER — Inpatient Hospital Stay: Payer: Medicare Other

## 2019-10-14 ENCOUNTER — Other Ambulatory Visit: Payer: Self-pay

## 2019-10-14 ENCOUNTER — Inpatient Hospital Stay: Payer: Medicare Other | Attending: Internal Medicine | Admitting: Internal Medicine

## 2019-10-14 VITALS — BP 119/87 | HR 82 | Temp 97.8°F | Resp 18 | Ht 63.0 in | Wt 115.2 lb

## 2019-10-14 DIAGNOSIS — E785 Hyperlipidemia, unspecified: Secondary | ICD-10-CM | POA: Diagnosis not present

## 2019-10-14 DIAGNOSIS — Z85828 Personal history of other malignant neoplasm of skin: Secondary | ICD-10-CM | POA: Diagnosis not present

## 2019-10-14 DIAGNOSIS — R5383 Other fatigue: Secondary | ICD-10-CM

## 2019-10-14 DIAGNOSIS — G894 Chronic pain syndrome: Secondary | ICD-10-CM | POA: Diagnosis not present

## 2019-10-14 DIAGNOSIS — Z5112 Encounter for antineoplastic immunotherapy: Secondary | ICD-10-CM | POA: Insufficient documentation

## 2019-10-14 DIAGNOSIS — Z79899 Other long term (current) drug therapy: Secondary | ICD-10-CM | POA: Insufficient documentation

## 2019-10-14 DIAGNOSIS — C3411 Malignant neoplasm of upper lobe, right bronchus or lung: Secondary | ICD-10-CM | POA: Insufficient documentation

## 2019-10-14 DIAGNOSIS — C3491 Malignant neoplasm of unspecified part of right bronchus or lung: Secondary | ICD-10-CM

## 2019-10-14 DIAGNOSIS — I10 Essential (primary) hypertension: Secondary | ICD-10-CM | POA: Diagnosis not present

## 2019-10-14 DIAGNOSIS — C7889 Secondary malignant neoplasm of other digestive organs: Secondary | ICD-10-CM | POA: Diagnosis not present

## 2019-10-14 DIAGNOSIS — Z7982 Long term (current) use of aspirin: Secondary | ICD-10-CM | POA: Diagnosis not present

## 2019-10-14 LAB — CBC WITH DIFFERENTIAL (CANCER CENTER ONLY)
Abs Immature Granulocytes: 0.03 10*3/uL (ref 0.00–0.07)
Basophils Absolute: 0 10*3/uL (ref 0.0–0.1)
Basophils Relative: 0 %
Eosinophils Absolute: 0.4 10*3/uL (ref 0.0–0.5)
Eosinophils Relative: 4 %
HCT: 35.6 % — ABNORMAL LOW (ref 36.0–46.0)
Hemoglobin: 12.2 g/dL (ref 12.0–15.0)
Immature Granulocytes: 0 %
Lymphocytes Relative: 18 %
Lymphs Abs: 1.7 10*3/uL (ref 0.7–4.0)
MCH: 36.5 pg — ABNORMAL HIGH (ref 26.0–34.0)
MCHC: 34.3 g/dL (ref 30.0–36.0)
MCV: 106.6 fL — ABNORMAL HIGH (ref 80.0–100.0)
Monocytes Absolute: 0.6 10*3/uL (ref 0.1–1.0)
Monocytes Relative: 6 %
Neutro Abs: 6.8 10*3/uL (ref 1.7–7.7)
Neutrophils Relative %: 72 %
Platelet Count: 256 10*3/uL (ref 150–400)
RBC: 3.34 MIL/uL — ABNORMAL LOW (ref 3.87–5.11)
RDW: 12.1 % (ref 11.5–15.5)
WBC Count: 9.6 10*3/uL (ref 4.0–10.5)
nRBC: 0 % (ref 0.0–0.2)

## 2019-10-14 LAB — CMP (CANCER CENTER ONLY)
ALT: 19 U/L (ref 0–44)
AST: 13 U/L — ABNORMAL LOW (ref 15–41)
Albumin: 3.3 g/dL — ABNORMAL LOW (ref 3.5–5.0)
Alkaline Phosphatase: 122 U/L (ref 38–126)
Anion gap: 9 (ref 5–15)
BUN: 7 mg/dL (ref 6–20)
CO2: 27 mmol/L (ref 22–32)
Calcium: 8.9 mg/dL (ref 8.9–10.3)
Chloride: 105 mmol/L (ref 98–111)
Creatinine: 0.67 mg/dL (ref 0.44–1.00)
GFR, Est AFR Am: >60 mL/min (ref >60)
GFR, Estimated: >60 mL/min (ref >60)
Glucose, Bld: 114 mg/dL — ABNORMAL HIGH (ref 70–99)
Potassium: 3.3 mmol/L — ABNORMAL LOW (ref 3.5–5.1)
Sodium: 141 mmol/L (ref 135–145)
Total Bilirubin: 0.3 mg/dL (ref 0.3–1.2)
Total Protein: 6.6 g/dL (ref 6.5–8.1)

## 2019-10-14 LAB — TSH: TSH: 2.987 u[IU]/mL (ref 0.308–3.960)

## 2019-10-14 MED ORDER — SODIUM CHLORIDE 0.9 % IV SOLN
Freq: Once | INTRAVENOUS | Status: AC
  Filled 2019-10-14: qty 250

## 2019-10-14 MED ORDER — SODIUM CHLORIDE 0.9 % IV SOLN
1200.0000 mg | Freq: Once | INTRAVENOUS | Status: AC
  Administered 2019-10-14: 11:00:00 1200 mg via INTRAVENOUS
  Filled 2019-10-14: qty 20

## 2019-10-14 MED ORDER — SODIUM CHLORIDE 0.9% FLUSH
10.0000 mL | INTRAVENOUS | Status: DC | PRN
  Administered 2019-10-14: 10 mL
  Filled 2019-10-14: qty 10

## 2019-10-14 MED ORDER — HEPARIN SOD (PORK) LOCK FLUSH 100 UNIT/ML IV SOLN
500.0000 [IU] | Freq: Once | INTRAVENOUS | Status: AC | PRN
  Administered 2019-10-14: 500 [IU]
  Filled 2019-10-14: qty 5

## 2019-10-14 NOTE — Patient Instructions (Signed)
[facility] Discharge Instructions for Patients Receiving Chemotherapy  Today you received the following chemotherapy agents: tecentriq  To help prevent nausea and vomiting after your treatment, we encourage you to take your nausea medication as directed.    If you develop nausea and vomiting that is not controlled by your nausea medication, call the clinic.   BELOW ARE SYMPTOMS THAT SHOULD BE REPORTED IMMEDIATELY:  *FEVER GREATER THAN 100.5 F  *CHILLS WITH OR WITHOUT FEVER  NAUSEA AND VOMITING THAT IS NOT CONTROLLED WITH YOUR NAUSEA MEDICATION  *UNUSUAL SHORTNESS OF BREATH  *UNUSUAL BRUISING OR BLEEDING  TENDERNESS IN MOUTH AND THROAT WITH OR WITHOUT PRESENCE OF ULCERS  *URINARY PROBLEMS  *BOWEL PROBLEMS  UNUSUAL RASH Items with * indicate a potential emergency and should be followed up as soon as possible.  Feel free to call the clinic should you have any questions or concerns. The clinic phone number is [phone].  Please show the [facility] at check-in to the Emergency Department and triage nurse.

## 2019-10-14 NOTE — Progress Notes (Signed)
[facility] [phone]   [phone]  OFFICE PROGRESS NOTE  [name], MD [address] [place] [address]  DIAGNOSIS: Extensive stage (T2a, N3, [name], large right anterior mediastinal and supraclavicular lymphadenopathy as well as pancreatic and splenic metastasis diagnosed in September 2017.  The patient had disease progression in October 2019.  PRIOR THERAPY:  1) Systemic chemotherapy was carboplatin for AUC of 5 on day 1 and etoposide 100 MG/M2 on days 1, 2 and 3 with Neulasta support. Status post 6 cycles with significant response of her disease. 2) Prophylactic cranial irradiation under the care of Dr. Sondra Come on 12/02/2016. 3) stereotactic radiotherapy to the recurrent right upper lobe pulmonary nodule under the care of Dr. Sondra Come completed November 10, 2017. 4) Retreatment with systemic chemotherapy with carboplatin for AUC of 5 on day 1 and etoposide 100 mg/M2 on days 1, 2 and 3 as well as Tecentriq (Atezolizumab) 1200 mg IV every 3 weeks with Neulasta support.  First dose June 22, 2018 for disease recurrence.  Status post 5 cycles.  Starting from cycle #2 her dose of carboplatin will be reduced to AUC of 4 and etoposide 80 mg/M2 on days 1, 2 and 3 in addition to the regular dose of Tecentriq.  CURRENT THERAPY: Maintenance treatment with single agent Tecentriq 1200 mg IV every 3 weeks.  Status post 16 cycles.  INTERVAL HISTORY: Debra Kidd 59 y.o. female returns to the clinic today for follow-up visit.  The patient is feeling fine today with no concerning complaints.  She denied having any current chest pain, shortness of breath, cough or hemoptysis.  She denied having any nausea, vomiting, diarrhea or constipation.  She has no headache or visual changes.  She has been tolerating her treatment with Tecentriq fairly well.  The patient is here today for evaluation before starting  cycle #17 of her treatment.   MEDICAL HISTORY: Past Medical History:  Diagnosis Date  . 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 Blood type, Rh positive   . Cancer ([facility])   . Chronic pain syndrome   . Depression 08/07/2016  . Encounter for antineoplastic chemotherapy 07/17/2016  . History of external beam radiation therapy 11/19/16-12/02/16   brain 25 Gy in 10 fractions  . Hyperlipidemia   . Hypertension   . Hypertension 08/07/2016  . Osteoporosis   . Small cell lung cancer ([facility]) dx'd 05/2016    ALLERGIES:  is allergic to codeine; motrin [ibuprofen]; thiazide-type diuretics; and vicodin [hydrocodone-acetaminophen].  MEDICATIONS:  Current Outpatient Medications  Medication Sig Dispense Refill  . aspirin EC 81 MG tablet Take 1 tablet (81 mg total) by mouth daily. 150 tablet 2  . atorvastatin (LIPITOR) 80 MG tablet Take 1 tablet (80 mg total) by mouth daily at 6 PM. 30 tablet 0  . celecoxib (CELEBREX) 200 MG capsule Take 200 mg by mouth daily as needed for mild pain.     Marland Kitchen clopidogrel (PLAVIX) 75 MG tablet Take 1 tablet (75 mg total) by mouth daily. 30 tablet 0  . diazepam (VALIUM) 5 MG tablet Take 5 mg by mouth 3 (three) times daily as needed for anxiety.   2  . diphenhydrAMINE (BENADRYL) 25 MG tablet Take 25 mg by mouth every 6 (six) hours as needed for itching or allergies.     . hydrOXYzine (ATARAX/VISTARIL) 10 MG tablet Take 1  tablet (10 mg total) by mouth 3 (three) times daily as needed. 90 tablet 0  . lidocaine-prilocaine (EMLA) cream Apply 1 application topically as needed. 30 g 0  . loperamide (IMODIUM A-D) 2 MG tablet Take 2 mg by mouth 4 (four) times daily as needed for diarrhea or loose stools.    . magnesium oxide (MAG-OX) 400 (241.3 Mg) MG tablet Take 1 tablet (400 mg total) by mouth daily. 30 tablet 0  . mirtazapine (REMERON) 15 MG tablet Take 1 tablet (15 mg total) by mouth at bedtime. 30 tablet 1  . oxyCODONE-acetaminophen (PERCOCET) 5-325 MG tablet Take 1  tablet by mouth every 6 (six) hours as needed for severe pain. 30 tablet 0  . polyethylene glycol (MIRALAX / GLYCOLAX) packet Take 17 g by mouth daily. 14 each 0  . potassium chloride 20 MEQ/15ML (10%) SOLN Take 30 mLs (40 mEq total) by mouth daily. 90 mL 0  . prochlorperazine (COMPAZINE) 10 MG tablet Take 1 tablet (10 mg total) by mouth every 6 (six) hours as needed for nausea or vomiting. 30 tablet 0  . senna-docusate (SENOKOT-S) 8.6-50 MG tablet Take 2 tablets by mouth at bedtime. 5 tablet 0  . traMADol (ULTRAM) 50 MG tablet Take 50 mg by mouth every 6 (six) hours as needed for moderate pain.     . Vitamin D, Ergocalciferol, (DRISDOL) 1.25 MG (50000 UT) CAPS capsule Take 1 capsule (50,000 Units total) by mouth every 7 (seven) days. 5 capsule 0   No current facility-administered medications for this visit.    SURGICAL HISTORY:  Past Surgical History:  Procedure Laterality Date  . ABDOMINAL HYSTERECTOMY  1981  . APPENDECTOMY     at age 73  . EYE SURGERY     left  . IR GENERIC HISTORICAL  06/03/2016   IR FLUORO GUIDE PORT INSERTION RIGHT 06/03/2016 [facility]  . IR GENERIC HISTORICAL  06/03/2016   IR US GUIDE VASC ACCESS RIGHT 06/03/2016 [facility]  . SKIN CANCER EXCISION     basal cell carcinoma L side of face    REVIEW OF SYSTEMS:  A comprehensive review of systems was negative.   PHYSICAL EXAMINATION: General appearance: alert, cooperative and no distress Head: Normocephalic, without obvious abnormality, atraumatic Neck: no adenopathy, no JVD, supple, symmetrical, trachea midline and thyroid not enlarged, symmetric, no tenderness/mass/nodules Lymph nodes: Cervical, supraclavicular, and axillary nodes normal. Resp: clear to auscultation bilaterally Back: symmetric, no curvature. ROM normal. No CVA tenderness. Cardio: regular rate and rhythm, S1, S2 normal, no murmur, click, rub or gallop GI: soft, non-tender; bowel sounds normal; no masses,  no organomegaly Extremities:  extremities normal, atraumatic, no cyanosis or edema  ECOG PERFORMANCE STATUS: 1 - Symptomatic but completely ambulatory  Blood pressure 119/87, pulse 82, temperature 97.8 F (36.6 C), temperature source Temporal, resp. rate 18, height 5\' 3"  (1.6 m), weight 115 lb 3.2 oz (52.3 kg), SpO2 100 %.  LABORATORY DATA: Lab Results  Component Value Date   WBC 9.6 10/14/2019   HGB 12.2 10/14/2019   HCT 35.6 (L) 10/14/2019   MCV 106.6 (H) 10/14/2019   PLT 256 10/14/2019      Chemistry      Component Value Date/Time   NA 141 09/23/2019 0950   NA 141 08/01/2017 0835   K 3.4 (L) 09/23/2019 0950   K 3.5 08/01/2017 0835   CL 105 09/23/2019 0950   CO2 26 09/23/2019 0950   CO2 25 08/01/2017 0835   BUN 5 (L) 09/23/2019 1761  BUN 8.0 08/01/2017 0835   CREATININE 0.70 09/23/2019 0950   CREATININE 0.9 08/01/2017 0835      Component Value Date/Time   CALCIUM 8.7 (L) 09/23/2019 0950   CALCIUM 9.9 08/01/2017 0835   ALKPHOS 128 (H) 09/23/2019 0950   ALKPHOS 142 08/01/2017 0835   AST 16 09/23/2019 0950   AST 15 08/01/2017 0835   ALT 16 09/23/2019 0950   ALT 19 08/01/2017 0835   BILITOT 0.3 09/23/2019 0950   BILITOT 0.35 08/01/2017 0835       RADIOGRAPHIC STUDIES: No results found.  ASSESSMENT AND PLAN:  This is a very pleasant 59 years old white female with extensive stage small cell lung cancer status post systemic chemotherapy with carbo platinum and etoposide for 6 cycles and the patient rotated her treatment well except for chemotherapy-induced anemia and requirement for PRBCs and platelet transfusion. She had significant improvement in her disease with the chemotherapy. She also had prophylactic cranial irradiation. She also underwent stereotactic radiotherapy to progressive right upper lobe pulmonary nodule. The patient has been in observation for close to 2 years. Repeat CT scan of the chest, abdomen and pelvis performed recently showed evidence for disease progression in the  abdomen. The patient was started on systemic chemotherapy again with carboplatin, etoposide and Tecentriq status post 21 cycles.  Starting from cycle #5 the patient is treated with maintenance single agent Tecentriq. She continues to tolerate this treatment well with no concerning adverse effects. I recommended for her to proceed with cycle #22 today as planned. She will come back for follow-up visit in 3 weeks for evaluation with repeat CT scan of the chest, abdomen pelvis for restaging of her disease. For the left big toe ingrown nail, she was referred to a podiatrist in [place] for evaluation and management of her condition. The patient was advised to call immediately if she has any concerning symptoms in the interval. The patient voices understanding of current disease status and treatment options and is in agreement with the current care plan. All questions were answered. The patient knows to call the clinic with any problems, questions or concerns. We can certainly see the patient much sooner if necessary.  Disclaimer: This note was dictated with voice recognition software. Similar sounding words can inadvertently be transcribed and may not be corrected upon review.

## 2019-10-15 ENCOUNTER — Telehealth: Payer: Self-pay | Admitting: Internal Medicine

## 2019-10-15 NOTE — Telephone Encounter (Signed)
Scheduled per los. Called and left msg. Mailed printout  °

## 2019-11-02 ENCOUNTER — Other Ambulatory Visit: Payer: Self-pay

## 2019-11-02 ENCOUNTER — Ambulatory Visit (HOSPITAL_COMMUNITY)
Admission: RE | Admit: 2019-11-02 | Discharge: 2019-11-02 | Disposition: A | Discharge status: Home / Self Care | Payer: Medicare Other | Source: Ambulatory Visit | Attending: Internal Medicine | Admitting: Internal Medicine

## 2019-11-02 DIAGNOSIS — C3491 Malignant neoplasm of unspecified part of right bronchus or lung: Secondary | ICD-10-CM | POA: Diagnosis not present

## 2019-11-02 MED ORDER — HEPARIN SOD (PORK) LOCK FLUSH 100 UNIT/ML IV SOLN
INTRAVENOUS | Status: AC
  Administered 2019-11-02: 500 [IU] via INTRAVENOUS
  Filled 2019-11-02: qty 5

## 2019-11-02 MED ORDER — SODIUM CHLORIDE (PF) 0.9 % IJ SOLN
INTRAMUSCULAR | Status: AC
  Filled 2019-11-02: qty 50

## 2019-11-02 MED ORDER — HEPARIN SOD (PORK) LOCK FLUSH 100 UNIT/ML IV SOLN: 500.0000 [IU] | Freq: Once | INTRAVENOUS | Status: AC

## 2019-11-02 MED ORDER — IOHEXOL 300 MG/ML  SOLN
100.0000 mL | Freq: Once | INTRAMUSCULAR | Status: AC | PRN
  Administered 2019-11-02: 100 mL via INTRAVENOUS

## 2019-11-03 ENCOUNTER — Telehealth: Payer: Self-pay | Admitting: Medical Oncology

## 2019-11-03 ENCOUNTER — Other Ambulatory Visit: Payer: Self-pay | Admitting: Physician Assistant

## 2019-11-03 DIAGNOSIS — L299 Pruritus, unspecified: Secondary | ICD-10-CM

## 2019-11-03 MED ORDER — HYDROXYZINE HCL 10 MG PO TABS: 10.0000 mg | ORAL_TABLET | Freq: Three times a day (TID) | ORAL | 0 refills | Status: DC | PRN

## 2019-11-03 NOTE — Telephone Encounter (Signed)
Requests refill for Hydroxyzine.  Yesterday  , her Port site bled after needle de-accessed in yesterday "all over my shirt". She held pressure and it stopped. I told her to moniter it for now.

## 2019-11-03 NOTE — Progress Notes (Signed)
[facility] OFFICE PROGRESS NOTE  [name], MD [address] [place] [address]  DIAGNOSIS: Extensive stage (T2a, N3, [name], large right anterior mediastinal and supraclavicular lymphadenopathy as well as pancreatic and splenic metastasis diagnosed in September 2017. The patient had disease progression in October 2019.  PRIOR THERAPY: 1) Systemic chemotherapy was carboplatin for AUC of 5 on day 1 and etoposide 100 MG/M2 on days 1, 2 and 3 with Neulasta support. Status post 6 cycles with significant response of her disease. 2) Prophylactic cranial irradiation under the care of Dr. Sondra Come on 12/02/2016. 3) stereotactic radiotherapy to the recurrent right upper lobe pulmonary nodule under the care of Dr. Sondra Come completed November 10, 2017. 4)Retreatment with systemic chemotherapy with carboplatin for AUC of 5 on day 1 and etoposide 100 mg/M2 on days 1, 2 and 3 as well as Tecentriq (Atezolizumab) 1200 mg IV every 3 weeks with Neulasta support. First dose June 22, 2018 for disease recurrence. Status post 5 cycles. Starting from cycle #2 her dose of carboplatin will be reduced to AUC of 4 and etoposide 80 mg/M2 on days 1, 2 and 3 in addition to the regular dose of Tecentriq  CURRENT THERAPY: Maintenance treatment with single agent Tecentriq 1200 mg IV every 3 weeks. Status post22cycles.  INTERVAL HISTORY: Debra Kidd 59 y.o. female returns to the clinic for a follow up visit. The patient is feeling well today without any concerning complaints. The patient continues to tolerate treatment with immunotherapy with Tecentriq well without any adverse effects except itching for which she uses atarax. Denies any fever, chills, night sweats, or weight loss. Denies any chest pain, shortness of breath, or hemoptysis but endorses her baseline cough. Denies any nausea, vomiting, diarrhea, or constipation. Denies any headache  or visual changes. Denies any rashes or skin changes. The patient is here today for evaluation prior to starting cycle # 23  MEDICAL HISTORY: Past Medical History:  Diagnosis Date  . 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 Blood type, Rh positive   . Cancer ([facility])   . Chronic pain syndrome   . Depression 08/07/2016  . Encounter for antineoplastic chemotherapy 07/17/2016  . History of external beam radiation therapy 11/19/16-12/02/16   brain 25 Gy in 10 fractions  . Hyperlipidemia   . Hypertension   . Hypertension 08/07/2016  . Osteoporosis   . Small cell lung cancer ([facility]) dx'd 05/2016    ALLERGIES:  is allergic to codeine; motrin [ibuprofen]; thiazide-type diuretics; and vicodin [hydrocodone-acetaminophen].  MEDICATIONS:  Current Outpatient Medications  Medication Sig Dispense Refill  . aspirin EC 81 MG tablet Take 1 tablet (81 mg total) by mouth daily. 150 tablet 2  . atorvastatin (LIPITOR) 80 MG tablet Take 1 tablet (80 mg total) by mouth daily at 6 PM. 30 tablet 0  . celecoxib (CELEBREX) 200 MG capsule Take 200 mg by mouth daily as needed for mild pain.     Marland Kitchen clopidogrel (PLAVIX) 75 MG tablet Take 1 tablet (75 mg total) by mouth daily. 30 tablet 0  . diazepam (VALIUM) 5 MG tablet Take 5 mg by mouth 3 (three) times daily as needed for anxiety.   2  . diphenhydrAMINE (BENADRYL) 25 MG tablet Take 25 mg by mouth every 6 (six) hours as needed for itching or allergies.     . hydrOXYzine (ATARAX/VISTARIL) 10 MG tablet Take 1 tablet (10  mg total) by mouth 3 (three) times daily as needed. 90 tablet 0  . lidocaine-prilocaine (EMLA) cream Apply 1 application topically as needed. 30 g 0  . loperamide (IMODIUM A-D) 2 MG tablet Take 2 mg by mouth 4 (four) times daily as needed for diarrhea or loose stools.    . magnesium oxide (MAG-OX) 400 (241.3 Mg) MG tablet Take 1 tablet (400 mg total) by mouth daily. (Patient not taking: Reported on 10/14/2019) 30 tablet 0  . mirtazapine (REMERON) 15  MG tablet Take 1 tablet (15 mg total) by mouth at bedtime. (Patient not taking: Reported on 10/14/2019) 30 tablet 1  . oxyCODONE-acetaminophen (PERCOCET) 5-325 MG tablet Take 1 tablet by mouth every 6 (six) hours as needed for severe pain. 30 tablet 0  . polyethylene glycol (MIRALAX / GLYCOLAX) packet Take 17 g by mouth daily. 14 each 0  . potassium chloride 20 MEQ/15ML (10%) SOLN Take 30 mLs (40 mEq total) by mouth daily. (Patient not taking: Reported on 10/14/2019) 90 mL 0  . prochlorperazine (COMPAZINE) 10 MG tablet Take 1 tablet (10 mg total) by mouth every 6 (six) hours as needed for nausea or vomiting. 30 tablet 0  . senna-docusate (SENOKOT-S) 8.6-50 MG tablet Take 2 tablets by mouth at bedtime. (Patient not taking: Reported on 10/14/2019) 5 tablet 0  . traMADol (ULTRAM) 50 MG tablet Take 50 mg by mouth every 6 (six) hours as needed for moderate pain.     . Vitamin D, Ergocalciferol, (DRISDOL) 1.25 MG (50000 UT) CAPS capsule Take 1 capsule (50,000 Units total) by mouth every 7 (seven) days. (Patient not taking: Reported on 10/14/2019) 5 capsule 0   No current facility-administered medications for this visit.    SURGICAL HISTORY:  Past Surgical History:  Procedure Laterality Date  . ABDOMINAL HYSTERECTOMY  1981  . APPENDECTOMY     at age 34  . EYE SURGERY     left  . IR GENERIC HISTORICAL  06/03/2016   IR FLUORO GUIDE PORT INSERTION RIGHT 06/03/2016 [facility]  . IR GENERIC HISTORICAL  06/03/2016   IR US GUIDE VASC ACCESS RIGHT 06/03/2016 [facility]  . SKIN CANCER EXCISION     basal cell carcinoma L side of face    REVIEW OF SYSTEMS:   Review of Systems  Constitutional: Negative for appetite change, chills, fatigue, fever and unexpected weight change.  HENT: Negative for mouth sores, nosebleeds, sore throat and trouble swallowing.   Eyes: Negative for eye problems and icterus.  Respiratory: Positive for baseline cough. Negative for hemoptysis, shortness of breath and wheezing.    Cardiovascular: Negative for chest pain and leg swelling.  Gastrointestinal: Negative for abdominal pain, constipation, diarrhea, nausea and vomiting.  Genitourinary: Negative for bladder incontinence, difficulty urinating, dysuria, frequency and hematuria.   Musculoskeletal: Negative for back pain, gait problem, neck pain and neck stiffness.  Skin: Positive for itching. Negative for rash.  Neurological: Negative for dizziness, extremity weakness, gait problem, headaches, [name], depression and sleep disturbance. The patient is not nervous/anxious.     PHYSICAL EXAMINATION:  There were no vitals taken for this visit.  ECOG PERFORMANCE STATUS: 1 - Symptomatic but completely ambulatory  Physical Exam  Constitutional: Oriented to person, place, and time and chronically ill appearing female and in no distress.  HENT:  Head: Normocephalic and atraumatic.  Mouth/Throat: Oropharynx is clear and moist. No oropharyngeal exudate.  Eyes: Conjunctivae are normal.  Right eye exhibits no discharge. Left eye exhibits no discharge. No scleral icterus.  Neck: Normal range of motion. Neck supple.  Cardiovascular: Normal rate, regular rhythm, normal heart sounds and intact distal pulses.   Pulmonary/Chest: Effort normal and breath sounds normal. No respiratory distress. No wheezes. No rales.  Abdominal: Soft. Bowel sounds are normal. Exhibits no distension and no mass. There is no tenderness.  Musculoskeletal: Normal range of motion. Exhibits no edema.  Lymphadenopathy:    No cervical adenopathy.  Neurological: Alert and oriented to person, place, and time. Exhibits normal muscle tone. Gait normal. Coordination normal.  Skin: Skin is warm and dry. No rash noted. Not diaphoretic. No erythema. No pallor.  Psychiatric: Mood, memory and judgment normal.  Vitals  reviewed.  LABORATORY DATA: Lab Results  Component Value Date   WBC 9.6 10/14/2019   HGB 12.2 10/14/2019   HCT 35.6 (L) 10/14/2019   MCV 106.6 (H) 10/14/2019   PLT 256 10/14/2019      Chemistry      Component Value Date/Time   NA 141 10/14/2019 0838   NA 141 08/01/2017 0835   K 3.3 (L) 10/14/2019 0838   K 3.5 08/01/2017 0835   CL 105 10/14/2019 0838   CO2 27 10/14/2019 0838   CO2 25 08/01/2017 0835   BUN 7 10/14/2019 0838   BUN 8.0 08/01/2017 0835   CREATININE 0.67 10/14/2019 0838   CREATININE 0.9 08/01/2017 0835      Component Value Date/Time   CALCIUM 8.9 10/14/2019 0838   CALCIUM 9.9 08/01/2017 0835   ALKPHOS 122 10/14/2019 0838   ALKPHOS 142 08/01/2017 0835   AST 13 (L) 10/14/2019 0838   AST 15 08/01/2017 0835   ALT 19 10/14/2019 0838   ALT 19 08/01/2017 0835   BILITOT 0.3 10/14/2019 0838   BILITOT 0.35 08/01/2017 0835       RADIOGRAPHIC STUDIES:  CT Chest W Contrast  Result Date: 11/02/2019 CLINICAL DATA:  Metastatic small cell lung cancer restaging. Ongoing immunotherapy. Weight loss. EXAM: CT CHEST, ABDOMEN, AND PELVIS WITH CONTRAST TECHNIQUE: Multidetector CT imaging of the chest, abdomen and pelvis was performed following the standard protocol during bolus administration of intravenous contrast. CONTRAST:  170mL OMNIPAQUE IOHEXOL 300 MG/ML  SOLN COMPARISON:  Multiple exams, including 08/31/2019 FINDINGS: CT CHEST FINDINGS Cardiovascular: Right Port-A-Cath tip: Cavoatrial junction. Coronary, aortic arch, and branch vessel atherosclerotic vascular disease. Considerable stenosis of the proximal left common carotid artery and of the proximal right subclavian artery. Mediastinum/Nodes: Indistinctly marginated right hilar lymph node 0.9 cm in short axis on image 27/2, stable. Lungs/Pleura: Stable mild scarring in both lungs. Peripheral consolidation in the right lung apex similar to prior and compatible with prior radiation therapy. Musculoskeletal: Similar appearance  of endplate compression fractures at T5, T6, T7, and T8. CT ABDOMEN PELVIS FINDINGS Hepatobiliary: Reduced conspicuity of the hepatic steatosis adjacent to the falciform ligament. Gallbladder unremarkable. Pancreas: Unremarkable Spleen: Unremarkable Adrenals/Urinary Tract: Unremarkable Stomach/Bowel: Unremarkable Vascular/Lymphatic: Aortoiliac atherosclerotic vascular disease. Reproductive: Uterus absent. Adnexa unremarkable. Other: Stable mild central mesenteric stranding, nonspecific. Musculoskeletal: Chronic superior endplate compression at L1. Sacralized L5. IMPRESSION: 1. No findings of active malignancy. Stable radiation therapy related findings at the right lung apex. 2. Other imaging findings of potential clinical significance: Coronary, aortic arch, and branch vessel atherosclerotic vascular disease (note that there is considerable stenosis of the proximal left common carotid artery and proximal right subclavian artery). Chronic endplate compression fractures at T5, T6, T7, T8, and L1. Sacralized L5. Aortic Atherosclerosis (ICD10-I70.0). Electronically  Signed   By: Van Clines M.D.   On: 11/02/2019 12:14   CT Abdomen Pelvis W Contrast  Result Date: 11/02/2019 CLINICAL DATA:  Metastatic small cell lung cancer restaging. Ongoing immunotherapy. Weight loss. EXAM: CT CHEST, ABDOMEN, AND PELVIS WITH CONTRAST TECHNIQUE: Multidetector CT imaging of the chest, abdomen and pelvis was performed following the standard protocol during bolus administration of intravenous contrast. CONTRAST:  136mL OMNIPAQUE IOHEXOL 300 MG/ML  SOLN COMPARISON:  Multiple exams, including 08/31/2019 FINDINGS: CT CHEST FINDINGS Cardiovascular: Right Port-A-Cath tip: Cavoatrial junction. Coronary, aortic arch, and branch vessel atherosclerotic vascular disease. Considerable stenosis of the proximal left common carotid artery and of the proximal right subclavian artery. Mediastinum/Nodes: Indistinctly marginated right hilar lymph  node 0.9 cm in short axis on image 27/2, stable. Lungs/Pleura: Stable mild scarring in both lungs. Peripheral consolidation in the right lung apex similar to prior and compatible with prior radiation therapy. Musculoskeletal: Similar appearance of endplate compression fractures at T5, T6, T7, and T8. CT ABDOMEN PELVIS FINDINGS Hepatobiliary: Reduced conspicuity of the hepatic steatosis adjacent to the falciform ligament. Gallbladder unremarkable. Pancreas: Unremarkable Spleen: Unremarkable Adrenals/Urinary Tract: Unremarkable Stomach/Bowel: Unremarkable Vascular/Lymphatic: Aortoiliac atherosclerotic vascular disease. Reproductive: Uterus absent. Adnexa unremarkable. Other: Stable mild central mesenteric stranding, nonspecific. Musculoskeletal: Chronic superior endplate compression at L1. Sacralized L5. IMPRESSION: 1. No findings of active malignancy. Stable radiation therapy related findings at the right lung apex. 2. Other imaging findings of potential clinical significance: Coronary, aortic arch, and branch vessel atherosclerotic vascular disease (note that there is considerable stenosis of the proximal left common carotid artery and proximal right subclavian artery). Chronic endplate compression fractures at T5, T6, T7, T8, and L1. Sacralized L5. Aortic Atherosclerosis (ICD10-I70.0). Electronically Signed   By: Van Clines M.D.   On: 11/02/2019 12:14     ASSESSMENT/PLAN:  This is a very pleasant 58 year old Caucasian female with extensive stage small cell lung cancer. She presented with a right upper lobe lung mass, large right anterior mediastinal and supraclavicular lymphadenopathy as well as a pancreatic andsplenic metastasis. She was diagnosed in September 2017.  The patient underwent systemic chemotherapy with carboplatin and etoposide. She is status post 6 cycles. She tolerated treatment well except for chemotherapy-induced anemia which required pRBCs and platelet transfusions. She had  a significant improvement of her disease with chemotherapy.The patientthenunderwent prophylactic cranial irradiation. She then underwent stereotactic radiotherapy to the right upper lobe and pulmonary nodule.  She had been on observation for 2 years before showing evidence of disease progression.   She recently hadbeen started on systemic chemotherapy with carboplatin, etoposide, and Tecentriq. Starting from cycle #5, she has been on maintenance single agent Tecentriq. She has been tolerating treatment well.She is status post 22 cycles of single agent Tecentriq.  She recently had a restaging CT scan performed. Dr. Julien Nordmann personally and independently reviewed the scan and discussed the results with the patient today. The scan did not show any evidence of disease progression. Dr. Julien Nordmann recommends that she continue on the same treatment at the same dose. She will receive cycle #23 today as scheduled.   She will continue to use atarax for her itching.   The patient was advised to call immediately if she has any concerning symptoms in the interval. The patient voices understanding of current disease status and treatment options and is in agreement with the current care plan. All questions were answered. The patient knows to call the clinic with any problems, questions or concerns. We can certainly see the patient much sooner  if necessary.   No orders of the defined types were placed in this encounter.    Debra Sakamoto L Braycen Burandt, PA-C 11/03/19  ADDENDUM: Hematology/Oncology Attending: I had a face-to-face encounter with the patient today.  I recommended her care plan.  This is a very pleasant 59 years old white female diagnosed with extensive stage small cell lung cancer in September 2017 status post several chemotherapy regimens and currently undergoing maintenance treatment with Tecentriq every 3 weeks status post 22 cycles.  The patient has been tolerating this treatment well with no  concerning adverse effects. She had repeat CT scan of the chest, abdomen pelvis performed recently.  I personally and independently reviewed the scans and discussed the results with the patient today. Her scan showed no concerning findings for disease progression. I recommended for the patient to continue her current treatment with Tecentriq and she will proceed with cycle #23 today. I will see her back for follow-up visit in 3 weeks for evaluation before the next cycle of her treatment. For the pruritus she will continue her current treatment with Atarax. The patient was advised to call immediately if she has any concerning symptoms in the interval.  Disclaimer: This note was dictated with voice recognition software. Similar sounding words can inadvertently be transcribed and may be missed upon review. Eilleen Kempf, MD 11/04/19

## 2019-11-04 ENCOUNTER — Inpatient Hospital Stay: Payer: Medicare Other

## 2019-11-04 ENCOUNTER — Encounter: Payer: Self-pay | Admitting: Physician Assistant

## 2019-11-04 ENCOUNTER — Other Ambulatory Visit: Payer: Self-pay

## 2019-11-04 ENCOUNTER — Inpatient Hospital Stay: Payer: Medicare Other | Attending: Internal Medicine | Admitting: Physician Assistant

## 2019-11-04 VITALS — BP 104/78 | HR 78 | Temp 98.9°F | Resp 18 | Ht 63.0 in | Wt 116.7 lb

## 2019-11-04 DIAGNOSIS — M810 Age-related osteoporosis without current pathological fracture: Secondary | ICD-10-CM | POA: Diagnosis not present

## 2019-11-04 DIAGNOSIS — Z85828 Personal history of other malignant neoplasm of skin: Secondary | ICD-10-CM | POA: Insufficient documentation

## 2019-11-04 DIAGNOSIS — Z5112 Encounter for antineoplastic immunotherapy: Secondary | ICD-10-CM | POA: Insufficient documentation

## 2019-11-04 DIAGNOSIS — Z9221 Personal history of antineoplastic chemotherapy: Secondary | ICD-10-CM | POA: Insufficient documentation

## 2019-11-04 DIAGNOSIS — I700 Atherosclerosis of aorta: Secondary | ICD-10-CM | POA: Insufficient documentation

## 2019-11-04 DIAGNOSIS — G894 Chronic pain syndrome: Secondary | ICD-10-CM | POA: Diagnosis not present

## 2019-11-04 DIAGNOSIS — D6481 Anemia due to antineoplastic chemotherapy: Secondary | ICD-10-CM | POA: Insufficient documentation

## 2019-11-04 DIAGNOSIS — Z923 Personal history of irradiation: Secondary | ICD-10-CM | POA: Insufficient documentation

## 2019-11-04 DIAGNOSIS — F329 Major depressive disorder, single episode, unspecified: Secondary | ICD-10-CM | POA: Insufficient documentation

## 2019-11-04 DIAGNOSIS — L600 Ingrowing nail: Secondary | ICD-10-CM | POA: Insufficient documentation

## 2019-11-04 DIAGNOSIS — Z7982 Long term (current) use of aspirin: Secondary | ICD-10-CM | POA: Diagnosis not present

## 2019-11-04 DIAGNOSIS — C3411 Malignant neoplasm of upper lobe, right bronchus or lung: Secondary | ICD-10-CM | POA: Insufficient documentation

## 2019-11-04 DIAGNOSIS — I10 Essential (primary) hypertension: Secondary | ICD-10-CM | POA: Diagnosis not present

## 2019-11-04 DIAGNOSIS — C3491 Malignant neoplasm of unspecified part of right bronchus or lung: Secondary | ICD-10-CM

## 2019-11-04 DIAGNOSIS — R5383 Other fatigue: Secondary | ICD-10-CM

## 2019-11-04 DIAGNOSIS — C7889 Secondary malignant neoplasm of other digestive organs: Secondary | ICD-10-CM | POA: Insufficient documentation

## 2019-11-04 DIAGNOSIS — Z79899 Other long term (current) drug therapy: Secondary | ICD-10-CM | POA: Insufficient documentation

## 2019-11-04 DIAGNOSIS — E785 Hyperlipidemia, unspecified: Secondary | ICD-10-CM | POA: Diagnosis not present

## 2019-11-04 DIAGNOSIS — C778 Secondary and unspecified malignant neoplasm of lymph nodes of multiple regions: Secondary | ICD-10-CM | POA: Diagnosis not present

## 2019-11-04 DIAGNOSIS — K760 Fatty (change of) liver, not elsewhere classified: Secondary | ICD-10-CM | POA: Diagnosis not present

## 2019-11-04 LAB — CBC WITH DIFFERENTIAL (CANCER CENTER ONLY)
Abs Immature Granulocytes: 0.03 10*3/uL (ref 0.00–0.07)
Basophils Absolute: 0 10*3/uL (ref 0.0–0.1)
Basophils Relative: 1 %
Eosinophils Absolute: 0.2 10*3/uL (ref 0.0–0.5)
Eosinophils Relative: 2 %
HCT: 34.9 % — ABNORMAL LOW (ref 36.0–46.0)
Hemoglobin: 11.9 g/dL — ABNORMAL LOW (ref 12.0–15.0)
Immature Granulocytes: 0 %
Lymphocytes Relative: 26 %
Lymphs Abs: 2.1 10*3/uL (ref 0.7–4.0)
MCH: 35.3 pg — ABNORMAL HIGH (ref 26.0–34.0)
MCHC: 34.1 g/dL (ref 30.0–36.0)
MCV: 103.6 fL — ABNORMAL HIGH (ref 80.0–100.0)
Monocytes Absolute: 0.5 10*3/uL (ref 0.1–1.0)
Monocytes Relative: 6 %
Neutro Abs: 5.3 10*3/uL (ref 1.7–7.7)
Neutrophils Relative %: 65 %
Platelet Count: 258 10*3/uL (ref 150–400)
RBC: 3.37 MIL/uL — ABNORMAL LOW (ref 3.87–5.11)
RDW: 11.9 % (ref 11.5–15.5)
WBC Count: 8.1 10*3/uL (ref 4.0–10.5)
nRBC: 0 % (ref 0.0–0.2)

## 2019-11-04 LAB — CMP (CANCER CENTER ONLY)
ALT: 30 U/L (ref 0–44)
AST: 26 U/L (ref 15–41)
Albumin: 3.5 g/dL (ref 3.5–5.0)
Alkaline Phosphatase: 125 U/L (ref 38–126)
Anion gap: 8 (ref 5–15)
BUN: 9 mg/dL (ref 6–20)
CO2: 27 mmol/L (ref 22–32)
Calcium: 8.6 mg/dL — ABNORMAL LOW (ref 8.9–10.3)
Chloride: 105 mmol/L (ref 98–111)
Creatinine: 0.86 mg/dL (ref 0.44–1.00)
GFR, Est AFR Am: >60 mL/min (ref >60)
GFR, Estimated: >60 mL/min (ref >60)
Glucose, Bld: 91 mg/dL (ref 70–99)
Potassium: 3.5 mmol/L (ref 3.5–5.1)
Sodium: 140 mmol/L (ref 135–145)
Total Bilirubin: 0.4 mg/dL (ref 0.3–1.2)
Total Protein: 6.7 g/dL (ref 6.5–8.1)

## 2019-11-04 MED ORDER — SODIUM CHLORIDE 0.9% FLUSH
10.0000 mL | INTRAVENOUS | Status: DC | PRN
  Administered 2019-11-04: 10 mL
  Filled 2019-11-04: qty 10

## 2019-11-04 MED ORDER — HEPARIN SOD (PORK) LOCK FLUSH 100 UNIT/ML IV SOLN
500.0000 [IU] | Freq: Once | INTRAVENOUS | Status: AC | PRN
  Administered 2019-11-04: 500 [IU]
  Filled 2019-11-04: qty 5

## 2019-11-04 MED ORDER — SODIUM CHLORIDE 0.9 % IV SOLN
Freq: Once | INTRAVENOUS | Status: AC
  Filled 2019-11-04: qty 250

## 2019-11-04 MED ORDER — SODIUM CHLORIDE 0.9 % IV SOLN
1200.0000 mg | Freq: Once | INTRAVENOUS | Status: AC
  Administered 2019-11-04: 1200 mg via INTRAVENOUS
  Filled 2019-11-04: qty 20

## 2019-11-04 NOTE — Patient Instructions (Signed)
[facility] Discharge Instructions for Patients Receiving Chemotherapy  Today you received the following Immunotherapy agent: Atezolizumab Gildardo Pounds)  To help prevent nausea and vomiting after your treatment, we encourage you to take your nausea medication as directed by your MD.   If you develop nausea and vomiting that is not controlled by your nausea medication, call the clinic.   BELOW ARE SYMPTOMS THAT SHOULD BE REPORTED IMMEDIATELY:  *FEVER GREATER THAN 100.5 F  *CHILLS WITH OR WITHOUT FEVER  NAUSEA AND VOMITING THAT IS NOT CONTROLLED WITH YOUR NAUSEA MEDICATION  *UNUSUAL SHORTNESS OF BREATH  *UNUSUAL BRUISING OR BLEEDING  TENDERNESS IN MOUTH AND THROAT WITH OR WITHOUT PRESENCE OF ULCERS  *URINARY PROBLEMS  *BOWEL PROBLEMS  UNUSUAL RASH Items with * indicate a potential emergency and should be followed up as soon as possible.  Feel free to call the clinic should you have any questions or concerns. The clinic phone number is [phone].  Please show the [facility] at check-in to the Emergency Department and triage nurse.  Coronavirus (COVID-19) Are you at risk?  Are you at risk for the Coronavirus (COVID-19)?  To be considered HIGH RISK for Coronavirus (COVID-19), you have to meet the following criteria:  . Traveled to [place], [place], [place], [place] or [place]; or in the [place] to [place], [place], [place], or [place]; and have fever, cough, and shortness of breath within the last 2 weeks of travel OR . Been in close contact with a person diagnosed with COVID-19 within the last 2 weeks and have fever, cough, and shortness of breath . IF YOU DO NOT MEET THESE CRITERIA, YOU ARE CONSIDERED LOW RISK FOR COVID-19.  What to do if you are HIGH RISK for COVID-19?  Marland Kitchen If you are having a medical emergency, call 911. . Seek medical care right away. Before you go to a doctor's office, urgent care or emergency department, call ahead and  tell them about your recent travel, contact with someone diagnosed with COVID-19, and your symptoms. You should receive instructions from your physician's office regarding next steps of care.  . When you arrive at healthcare provider, tell the healthcare staff immediately you have returned from visiting [place], [place], [place], [place] or [place]; or traveled in the [place] to [place], [place], [place], or [place]; in the last two weeks or you have been in close contact with a person diagnosed with COVID-19 in the last 2 weeks.   . Tell the health care staff about your symptoms: fever, cough and shortness of breath. . After you have been seen by a medical provider, you will be either: o Tested for (COVID-19) and discharged home on quarantine except to seek medical care if symptoms worsen, and asked to  - Stay home and avoid contact with others until you get your results (4-5 days)  - Avoid travel on public transportation if possible (such as bus, train, or airplane) or o Sent to the Emergency Department by EMS for evaluation, COVID-19 testing, and possible admission depending on your condition and test results.  What to do if you are LOW RISK for COVID-19?  Reduce your risk of any infection by using the same precautions used for avoiding the common cold or flu:  Marland Kitchen Wash your hands often with soap and warm water for at least 20 seconds.  If soap and water are not readily available, use an alcohol-based hand sanitizer with at least 60% alcohol.  Marland Kitchen  If coughing or sneezing, cover your mouth and nose by coughing or sneezing into the elbow areas of your shirt or coat, into a tissue or into your sleeve (not your hands). . Avoid shaking hands with others and consider head nods or verbal greetings only. . Avoid touching your eyes, nose, or mouth with unwashed hands.  . Avoid close contact with people who are sick. . Avoid places or events with large numbers of people in one location, like  concerts or sporting events. . Carefully consider travel plans you have or are making. . If you are planning any travel outside or inside the [place], visit the CDC's Travelers' Health webpage for the latest health notices. . If you have some symptoms but not all symptoms, continue to monitor at home and seek medical attention if your symptoms worsen. . If you are having a medical emergency, call 911.   [facility] / e-Visit: [email or web address]         [facility]: [facility]: [phone]                   [facility]: [phone]

## 2019-11-25 ENCOUNTER — Inpatient Hospital Stay: Payer: Medicare Other

## 2019-11-25 ENCOUNTER — Encounter: Payer: Self-pay | Admitting: Internal Medicine

## 2019-11-25 ENCOUNTER — Inpatient Hospital Stay (HOSPITAL_BASED_OUTPATIENT_CLINIC_OR_DEPARTMENT_OTHER): Payer: Medicare Other | Admitting: Internal Medicine

## 2019-11-25 ENCOUNTER — Other Ambulatory Visit: Payer: Self-pay

## 2019-11-25 VITALS — BP 100/79 | HR 81 | Temp 97.8°F | Resp 18 | Ht 63.0 in | Wt 115.7 lb

## 2019-11-25 DIAGNOSIS — L600 Ingrowing nail: Secondary | ICD-10-CM

## 2019-11-25 DIAGNOSIS — C3491 Malignant neoplasm of unspecified part of right bronchus or lung: Secondary | ICD-10-CM

## 2019-11-25 DIAGNOSIS — D6481 Anemia due to antineoplastic chemotherapy: Secondary | ICD-10-CM

## 2019-11-25 DIAGNOSIS — R5383 Other fatigue: Secondary | ICD-10-CM

## 2019-11-25 DIAGNOSIS — Z5112 Encounter for antineoplastic immunotherapy: Secondary | ICD-10-CM | POA: Diagnosis not present

## 2019-11-25 DIAGNOSIS — T451X5A Adverse effect of antineoplastic and immunosuppressive drugs, initial encounter: Secondary | ICD-10-CM

## 2019-11-25 LAB — CMP (CANCER CENTER ONLY)
ALT: 110 U/L — ABNORMAL HIGH (ref 0–44)
AST: 76 U/L — ABNORMAL HIGH (ref 15–41)
Albumin: 3.3 g/dL — ABNORMAL LOW (ref 3.5–5.0)
Alkaline Phosphatase: 178 U/L — ABNORMAL HIGH (ref 38–126)
Anion gap: 10 (ref 5–15)
BUN: 8 mg/dL (ref 6–20)
CO2: 25 mmol/L (ref 22–32)
Calcium: 8.7 mg/dL — ABNORMAL LOW (ref 8.9–10.3)
Chloride: 102 mmol/L (ref 98–111)
Creatinine: 0.68 mg/dL (ref 0.44–1.00)
GFR, Est AFR Am: >60 mL/min (ref >60)
GFR, Estimated: >60 mL/min (ref >60)
Glucose, Bld: 100 mg/dL — ABNORMAL HIGH (ref 70–99)
Potassium: 3.4 mmol/L — ABNORMAL LOW (ref 3.5–5.1)
Sodium: 137 mmol/L (ref 135–145)
Total Bilirubin: 0.7 mg/dL (ref 0.3–1.2)
Total Protein: 6.8 g/dL (ref 6.5–8.1)

## 2019-11-25 LAB — CBC WITH DIFFERENTIAL (CANCER CENTER ONLY)
Abs Immature Granulocytes: 0.01 10*3/uL (ref 0.00–0.07)
Basophils Absolute: 0 10*3/uL (ref 0.0–0.1)
Basophils Relative: 0 %
Eosinophils Absolute: 0 10*3/uL (ref 0.0–0.5)
Eosinophils Relative: 1 %
HCT: 36 % (ref 36.0–46.0)
Hemoglobin: 12.2 g/dL (ref 12.0–15.0)
Immature Granulocytes: 0 %
Lymphocytes Relative: 28 %
Lymphs Abs: 1.3 10*3/uL (ref 0.7–4.0)
MCH: 35.2 pg — ABNORMAL HIGH (ref 26.0–34.0)
MCHC: 33.9 g/dL (ref 30.0–36.0)
MCV: 103.7 fL — ABNORMAL HIGH (ref 80.0–100.0)
Monocytes Absolute: 0.4 10*3/uL (ref 0.1–1.0)
Monocytes Relative: 9 %
Neutro Abs: 2.9 10*3/uL (ref 1.7–7.7)
Neutrophils Relative %: 62 %
Platelet Count: 229 10*3/uL (ref 150–400)
RBC: 3.47 MIL/uL — ABNORMAL LOW (ref 3.87–5.11)
RDW: 11.8 % (ref 11.5–15.5)
WBC Count: 4.6 10*3/uL (ref 4.0–10.5)
nRBC: 0 % (ref 0.0–0.2)

## 2019-11-25 LAB — TSH: TSH: 2.078 u[IU]/mL (ref 0.308–3.960)

## 2019-11-25 MED ORDER — HEPARIN SOD (PORK) LOCK FLUSH 100 UNIT/ML IV SOLN
500.0000 [IU] | Freq: Once | INTRAVENOUS | Status: DC | PRN
  Filled 2019-11-25: qty 5

## 2019-11-25 MED ORDER — SODIUM CHLORIDE 0.9% FLUSH
10.0000 mL | INTRAVENOUS | Status: DC | PRN
  Administered 2019-11-25: 10 mL
  Filled 2019-11-25: qty 10

## 2019-11-25 MED ORDER — SODIUM CHLORIDE 0.9 % IV SOLN
1200.0000 mg | Freq: Once | INTRAVENOUS | Status: AC
  Administered 2019-11-25: 1200 mg via INTRAVENOUS
  Filled 2019-11-25: qty 20

## 2019-11-25 MED ORDER — SODIUM CHLORIDE 0.9 % IV SOLN
Freq: Once | INTRAVENOUS | Status: AC
  Filled 2019-11-25: qty 250

## 2019-11-25 MED ORDER — HEPARIN SOD (PORK) LOCK FLUSH 100 UNIT/ML IV SOLN
500.0000 [IU] | Freq: Once | INTRAVENOUS | Status: AC | PRN
  Administered 2019-11-25: 500 [IU]
  Filled 2019-11-25: qty 5

## 2019-11-25 NOTE — Progress Notes (Signed)
[facility] Telephone:([phone]) [phone]   Fax:([phone]) [phone]  OFFICE PROGRESS NOTE  [name], MD [address] [place] [address]  DIAGNOSIS: Extensive stage (T2a, N3, [name], large right anterior mediastinal and supraclavicular lymphadenopathy as well as pancreatic and splenic metastasis diagnosed in September 2017.  The patient had disease progression in October 2019.  PRIOR THERAPY:  1) Systemic chemotherapy was carboplatin for AUC of 5 on day 1 and etoposide 100 MG/M2 on days 1, 2 and 3 with Neulasta support. Status post 6 cycles with significant response of her disease. 2) Prophylactic cranial irradiation under the care of Dr. Sondra Come on 12/02/2016. 3) stereotactic radiotherapy to the recurrent right upper lobe pulmonary nodule under the care of Dr. Sondra Come completed November 10, 2017. 4) Retreatment with systemic chemotherapy with carboplatin for AUC of 5 on day 1 and etoposide 100 mg/M2 on days 1, 2 and 3 as well as Tecentriq (Atezolizumab) 1200 mg IV every 3 weeks with Neulasta support.  First dose June 22, 2018 for disease recurrence.  Status post 5 cycles.  Starting from cycle #2 her dose of carboplatin will be reduced to AUC of 4 and etoposide 80 mg/M2 on days 1, 2 and 3 in addition to the regular dose of Tecentriq.  CURRENT THERAPY: Maintenance treatment with single agent Tecentriq 1200 mg IV every 3 weeks.  Status post 18 cycles.  INTERVAL HISTORY: Debra Kidd 59 y.o. female returns to the clinic today for follow-up visit.  The patient is feeling fine today with no concerning complaints except for the ingrown toenail in the left foot.  She denied having any current chest pain, shortness of breath, cough or hemoptysis.  She denied having any fever or chills.  She has no nausea, vomiting, diarrhea or constipation.  She denied having any headache or visual changes.  She continues to tolerate her  treatment with Tecentriq fairly well.  She is here today for evaluation before starting cycle #19 of her maintenance treatment.  MEDICAL HISTORY: Past Medical History:  Diagnosis Date  . 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 Blood type, Rh positive   . Cancer ([place])   . Chronic pain syndrome   . Depression 08/07/2016  . Encounter for antineoplastic chemotherapy 07/17/2016  . History of external beam radiation therapy 11/19/16-12/02/16   brain 25 Gy in 10 fractions  . Hyperlipidemia   . Hypertension   . Hypertension 08/07/2016  . Osteoporosis   . Small cell lung cancer ([place]) dx'd 05/2016    ALLERGIES:  is allergic to codeine; motrin [ibuprofen]; thiazide-type diuretics; and vicodin [hydrocodone-acetaminophen].  MEDICATIONS:  Current Outpatient Medications  Medication Sig Dispense Refill  . aspirin EC 81 MG tablet Take 1 tablet (81 mg total) by mouth daily. 150 tablet 2  . atorvastatin (LIPITOR) 80 MG tablet Take 1 tablet (80 mg total) by mouth daily at 6 PM. 30 tablet 0  . celecoxib (CELEBREX) 200 MG capsule Take 200 mg by mouth daily as needed for mild pain.     Marland Kitchen clopidogrel (PLAVIX) 75 MG tablet Take 1 tablet (75 mg total) by mouth daily. 30 tablet 0  . diazepam (VALIUM) 5 MG tablet Take 5 mg by mouth 3 (three) times daily as needed for anxiety.   2  . diphenhydrAMINE (BENADRYL) 25 MG tablet Take 25 mg by mouth every 6 (six) hours as needed  for itching or allergies.     . hydrOXYzine (ATARAX/VISTARIL) 10 MG tablet Take 1 tablet (10 mg total) by mouth 3 (three) times daily as needed. 90 tablet 0  . lidocaine-prilocaine (EMLA) cream Apply 1 application topically as needed. 30 g 0  . loperamide (IMODIUM A-D) 2 MG tablet Take 2 mg by mouth 4 (four) times daily as needed for diarrhea or loose stools.    . magnesium oxide (MAG-OX) 400 (241.3 Mg) MG tablet Take 1 tablet (400 mg total) by mouth daily. 30 tablet 0  . mirtazapine (REMERON) 15 MG tablet Take 1 tablet (15 mg total) by  mouth at bedtime. 30 tablet 1  . oxyCODONE-acetaminophen (PERCOCET) 5-325 MG tablet Take 1 tablet by mouth every 6 (six) hours as needed for severe pain. 30 tablet 0  . polyethylene glycol (MIRALAX / GLYCOLAX) packet Take 17 g by mouth daily. 14 each 0  . potassium chloride 20 MEQ/15ML (10%) SOLN Take 30 mLs (40 mEq total) by mouth daily. 90 mL 0  . prochlorperazine (COMPAZINE) 10 MG tablet Take 1 tablet (10 mg total) by mouth every 6 (six) hours as needed for nausea or vomiting. 30 tablet 0  . senna-docusate (SENOKOT-S) 8.6-50 MG tablet Take 2 tablets by mouth at bedtime. 5 tablet 0  . traMADol (ULTRAM) 50 MG tablet Take 50 mg by mouth every 6 (six) hours as needed for moderate pain.     . Vitamin D, Ergocalciferol, (DRISDOL) 1.25 MG (50000 UT) CAPS capsule Take 1 capsule (50,000 Units total) by mouth every 7 (seven) days. 5 capsule 0   No current facility-administered medications for this visit.    SURGICAL HISTORY:  Past Surgical History:  Procedure Laterality Date  . ABDOMINAL HYSTERECTOMY  1981  . APPENDECTOMY     at age 21  . EYE SURGERY     left  . IR GENERIC HISTORICAL  06/03/2016   IR FLUORO GUIDE PORT INSERTION RIGHT 06/03/2016 [facility]INTER[facility] RAD  . IR GENERIC HISTORICAL  06/03/2016   IR US GUIDE VASC ACCESS RIGHT 06/03/2016 [facility]INTER[facility]  . SKIN CANCER EXCISION     basal cell carcinoma L side of face    REVIEW OF SYSTEMS:  A comprehensive review of systems was negative.   PHYSICAL EXAMINATION: General appearance: alert, cooperative and no distress Head: Normocephalic, without obvious abnormality, atraumatic Neck: no adenopathy, no JVD, supple, symmetrical, trachea midline and thyroid not enlarged, symmetric, no tenderness/mass/nodules Lymph nodes: Cervical, supraclavicular, and axillary nodes normal. Resp: clear to auscultation bilaterally Back: symmetric, no curvature. ROM normal. No CVA tenderness. Cardio: regular rate and rhythm, S1, S2 normal, no murmur, click, rub or  gallop GI: soft, non-tender; bowel sounds normal; no masses,  no organomegaly Extremities: extremities normal, atraumatic, no cyanosis or edema  ECOG PERFORMANCE STATUS: 1 - Symptomatic but completely ambulatory  Blood pressure 100/79, pulse 81, temperature 97.8 F (36.6 C), temperature source Oral, resp. rate 18, height 5\' 3"  (1.6 m), weight 115 lb 11.2 oz (52.5 kg), SpO2 100 %.  LABORATORY DATA: Lab Results  Component Value Date   WBC 4.6 11/25/2019   HGB 12.2 11/25/2019   HCT 36.0 11/25/2019   MCV 103.7 (H) 11/25/2019   PLT 229 11/25/2019      Chemistry      Component Value Date/Time   NA 140 11/04/2019 1322   NA 141 08/01/2017 0835   K 3.5 11/04/2019 1322   K 3.5 08/01/2017 0835   CL 105 11/04/2019 1322   CO2 27 11/04/2019 1322  CO2 25 08/01/2017 0835   BUN 9 11/04/2019 1322   BUN 8.0 08/01/2017 0835   CREATININE 0.86 11/04/2019 1322   CREATININE 0.9 08/01/2017 0835      Component Value Date/Time   CALCIUM 8.6 (L) 11/04/2019 1322   CALCIUM 9.9 08/01/2017 0835   ALKPHOS 125 11/04/2019 1322   ALKPHOS 142 08/01/2017 0835   AST 26 11/04/2019 1322   AST 15 08/01/2017 0835   ALT 30 11/04/2019 1322   ALT 19 08/01/2017 0835   BILITOT 0.4 11/04/2019 1322   BILITOT 0.35 08/01/2017 0835       RADIOGRAPHIC STUDIES: CT Chest W Contrast  Result Date: 11/02/2019 CLINICAL DATA:  Metastatic small cell lung cancer restaging. Ongoing immunotherapy. Weight loss. EXAM: CT CHEST, ABDOMEN, AND PELVIS WITH CONTRAST TECHNIQUE: Multidetector CT imaging of the chest, abdomen and pelvis was performed following the standard protocol during bolus administration of intravenous contrast. CONTRAST:  126mL OMNIPAQUE IOHEXOL 300 MG/ML  SOLN COMPARISON:  Multiple exams, including 08/31/2019 FINDINGS: CT CHEST FINDINGS Cardiovascular: Right Port-A-Cath tip: Cavoatrial junction. Coronary, aortic arch, and branch vessel atherosclerotic vascular disease. Considerable stenosis of the proximal left  common carotid artery and of the proximal right subclavian artery. Mediastinum/Nodes: Indistinctly marginated right hilar lymph node 0.9 cm in short axis on image 27/2, stable. Lungs/Pleura: Stable mild scarring in both lungs. Peripheral consolidation in the right lung apex similar to prior and compatible with prior radiation therapy. Musculoskeletal: Similar appearance of endplate compression fractures at T5, T6, T7, and T8. CT ABDOMEN PELVIS FINDINGS Hepatobiliary: Reduced conspicuity of the hepatic steatosis adjacent to the falciform ligament. Gallbladder unremarkable. Pancreas: Unremarkable Spleen: Unremarkable Adrenals/Urinary Tract: Unremarkable Stomach/Bowel: Unremarkable Vascular/Lymphatic: Aortoiliac atherosclerotic vascular disease. Reproductive: Uterus absent. Adnexa unremarkable. Other: Stable mild central mesenteric stranding, nonspecific. Musculoskeletal: Chronic superior endplate compression at L1. Sacralized L5. IMPRESSION: 1. No findings of active malignancy. Stable radiation therapy related findings at the right lung apex. 2. Other imaging findings of potential clinical significance: Coronary, aortic arch, and branch vessel atherosclerotic vascular disease (note that there is considerable stenosis of the proximal left common carotid artery and proximal right subclavian artery). Chronic endplate compression fractures at T5, T6, T7, T8, and L1. Sacralized L5. Aortic Atherosclerosis (ICD10-I70.0). Electronically Signed   By: Van Clines M.D.   On: 11/02/2019 12:14   CT Abdomen Pelvis W Contrast  Result Date: 11/02/2019 CLINICAL DATA:  Metastatic small cell lung cancer restaging. Ongoing immunotherapy. Weight loss. EXAM: CT CHEST, ABDOMEN, AND PELVIS WITH CONTRAST TECHNIQUE: Multidetector CT imaging of the chest, abdomen and pelvis was performed following the standard protocol during bolus administration of intravenous contrast. CONTRAST:  182mL OMNIPAQUE IOHEXOL 300 MG/ML  SOLN COMPARISON:   Multiple exams, including 08/31/2019 FINDINGS: CT CHEST FINDINGS Cardiovascular: Right Port-A-Cath tip: Cavoatrial junction. Coronary, aortic arch, and branch vessel atherosclerotic vascular disease. Considerable stenosis of the proximal left common carotid artery and of the proximal right subclavian artery. Mediastinum/Nodes: Indistinctly marginated right hilar lymph node 0.9 cm in short axis on image 27/2, stable. Lungs/Pleura: Stable mild scarring in both lungs. Peripheral consolidation in the right lung apex similar to prior and compatible with prior radiation therapy. Musculoskeletal: Similar appearance of endplate compression fractures at T5, T6, T7, and T8. CT ABDOMEN PELVIS FINDINGS Hepatobiliary: Reduced conspicuity of the hepatic steatosis adjacent to the falciform ligament. Gallbladder unremarkable. Pancreas: Unremarkable Spleen: Unremarkable Adrenals/Urinary Tract: Unremarkable Stomach/Bowel: Unremarkable Vascular/Lymphatic: Aortoiliac atherosclerotic vascular disease. Reproductive: Uterus absent. Adnexa unremarkable. Other: Stable mild central mesenteric stranding, nonspecific. Musculoskeletal: Chronic superior endplate compression  at L1. Sacralized L5. IMPRESSION: 1. No findings of active malignancy. Stable radiation therapy related findings at the right lung apex. 2. Other imaging findings of potential clinical significance: Coronary, aortic arch, and branch vessel atherosclerotic vascular disease (note that there is considerable stenosis of the proximal left common carotid artery and proximal right subclavian artery). Chronic endplate compression fractures at T5, T6, T7, T8, and L1. Sacralized L5. Aortic Atherosclerosis (ICD10-I70.0). Electronically Signed   By: Van Clines M.D.   On: 11/02/2019 12:14    ASSESSMENT AND PLAN:  This is a very pleasant 59 years old white female with extensive stage small cell lung cancer status post systemic chemotherapy with carbo platinum and etoposide for  6 cycles and the patient rotated her treatment well except for chemotherapy-induced anemia and requirement for PRBCs and platelet transfusion. She had significant improvement in her disease with the chemotherapy. She also had prophylactic cranial irradiation. She also underwent stereotactic radiotherapy to progressive right upper lobe pulmonary nodule. The patient has been in observation for close to 2 years. Repeat CT scan of the chest, abdomen and pelvis performed recently showed evidence for disease progression in the abdomen. The patient was started on systemic chemotherapy again with carboplatin, etoposide and Tecentriq status post 23 cycles.  Starting from cycle #5 the patient is treated with maintenance single agent Tecentriq. The patient has been tolerating this treatment well with no concerning adverse effects. I recommended for her to proceed with cycle #24 today as planned. For the left big toe ingrown nail, I will refer her to a podiatrist in [place] since there was no availability in [place]. The patient will come back for follow-up visit in 3 weeks for evaluation before the next cycle of her treatment. She was advised to call immediately if she has any concerning symptoms in the interval. The patient voices understanding of current disease status and treatment options and is in agreement with the current care plan. All questions were answered. The patient knows to call the clinic with any problems, questions or concerns. We can certainly see the patient much sooner if necessary.  Disclaimer: This note was dictated with voice recognition software. Similar sounding words can inadvertently be transcribed and may not be corrected upon review.

## 2019-11-25 NOTE — Progress Notes (Signed)
OK to treat despite elevated ALT per Dr Julien Nordmann.

## 2019-11-25 NOTE — Patient Instructions (Signed)
[facility] Discharge Instructions for Patients Receiving Chemotherapy  Today you received the following chemotherapy agent: Atezolizumab (Tencentriq))  To help prevent nausea and vomiting after your treatment, we encourage you to take your nausea medication as directed by your MD.   If you develop nausea and vomiting that is not controlled by your nausea medication, call the clinic.   BELOW ARE SYMPTOMS THAT SHOULD BE REPORTED IMMEDIATELY:  *FEVER GREATER THAN 100.5 F  *CHILLS WITH OR WITHOUT FEVER  NAUSEA AND VOMITING THAT IS NOT CONTROLLED WITH YOUR NAUSEA MEDICATION  *UNUSUAL SHORTNESS OF BREATH  *UNUSUAL BRUISING OR BLEEDING  TENDERNESS IN MOUTH AND THROAT WITH OR WITHOUT PRESENCE OF ULCERS  *URINARY PROBLEMS  *BOWEL PROBLEMS  UNUSUAL RASH Items with * indicate a potential emergency and should be followed up as soon as possible.  Feel free to call the clinic should you have any questions or concerns. The clinic phone number is [phone].  Please show the [facility] at check-in to the Emergency Department and triage nurse.  Coronavirus (COVID-19) Are you at risk?  Are you at risk for the Coronavirus (COVID-19)?  To be considered HIGH RISK for Coronavirus (COVID-19), you have to meet the following criteria:  . Traveled to [place], [place], [place], [place] or [place]; or in the [place] to [place], [place], [place], or [place]; and have fever, cough, and shortness of breath within the last 2 weeks of travel OR . Been in close contact with a person diagnosed with COVID-19 within the last 2 weeks and have fever, cough, and shortness of breath . IF YOU DO NOT MEET THESE CRITERIA, YOU ARE CONSIDERED LOW RISK FOR COVID-19.  What to do if you are HIGH RISK for COVID-19?  Marland Kitchen If you are having a medical emergency, call 911. . Seek medical care right away. Before you go to a doctor's office, urgent care or emergency department, call ahead  and tell them about your recent travel, contact with someone diagnosed with COVID-19, and your symptoms. You should receive instructions from your physician's office regarding next steps of care.  . When you arrive at healthcare provider, tell the healthcare staff immediately you have returned from visiting [place], [place], [place], [place] or [place]; or traveled in the [place] to [place], [place], [place], or [place]; in the last two weeks or you have been in close contact with a person diagnosed with COVID-19 in the last 2 weeks.   . Tell the health care staff about your symptoms: fever, cough and shortness of breath. . After you have been seen by a medical provider, you will be either: o Tested for (COVID-19) and discharged home on quarantine except to seek medical care if symptoms worsen, and asked to  - Stay home and avoid contact with others until you get your results (4-5 days)  - Avoid travel on public transportation if possible (such as bus, train, or airplane) or o Sent to the Emergency Department by EMS for evaluation, COVID-19 testing, and possible admission depending on your condition and test results.  What to do if you are LOW RISK for COVID-19?  Reduce your risk of any infection by using the same precautions used for avoiding the common cold or flu:  Marland Kitchen Wash your hands often with soap and warm water for at least 20 seconds.  If soap and water are not readily available, use an alcohol-based hand sanitizer with at least 60% alcohol.  Marland Kitchen  If coughing or sneezing, cover your mouth and nose by coughing or sneezing into the elbow areas of your shirt or coat, into a tissue or into your sleeve (not your hands). . Avoid shaking hands with others and consider head nods or verbal greetings only. . Avoid touching your eyes, nose, or mouth with unwashed hands.  . Avoid close contact with people who are sick. . Avoid places or events with large numbers of people in one location, like  concerts or sporting events. . Carefully consider travel plans you have or are making. . If you are planning any travel outside or inside the [place], visit the CDC's Travelers' Health webpage for the latest health notices. . If you have some symptoms but not all symptoms, continue to monitor at home and seek medical attention if your symptoms worsen. . If you are having a medical emergency, call 911.   [place] / e-Visit: [email or web address]         [facility]: [facility]: [phone]                   [facility]: [phone]

## 2019-11-30 ENCOUNTER — Telehealth: Payer: Self-pay | Admitting: Podiatry

## 2019-11-30 NOTE — Telephone Encounter (Signed)
Pt is scheduled for 12-17-2019 consult for (4) ingrown nails and pt is on a blood thinner & aspirin regimen.  Can you contact her to discuss if she needs to stop/adjust those prior to her appt.  thanks

## 2019-12-01 ENCOUNTER — Telehealth: Payer: Self-pay | Admitting: *Deleted

## 2019-12-01 NOTE — Telephone Encounter (Signed)
Returned patient's call and spoke to patient's husband. Patient is on blood thinners and aspirin and was concerned about her upcoming appointment with Dr. Paulla Dolly on 12/17/19. Patient has 4 ingrown toenails and was concerned this would be a problem.  I talked to Dr. Paulla Dolly and he said that patient will be fine coming in that day without stopping their blood thinners.  I relayed this message to patient's husband and they stated they understood.

## 2019-12-10 NOTE — Progress Notes (Signed)
Pharmacist Chemotherapy Monitoring - Follow Up Assessment    I verify that I have reviewed each item in the below checklist:  . Regimen for the patient is scheduled for the appropriate day and plan matches scheduled date. Marland Kitchen Appropriate non-routine labs are ordered dependent on drug ordered. . If applicable, additional medications reviewed and ordered per protocol based on lifetime cumulative doses and/or treatment regimen.   Plan for follow-up and/or issues identified: No . I-vent associated with next due treatment: No . MD and/or nursing notified: No  Joana Nolton K 12/10/2019 9:10 AM

## 2019-12-14 NOTE — Progress Notes (Signed)
[facility] OFFICE PROGRESS NOTE  [name], MD [address] [place] [address]  DIAGNOSIS: Extensive stage (T2a, N3, [name], large right anterior mediastinal and supraclavicular lymphadenopathy as well as pancreatic and splenic metastasis diagnosed in September 2017. The patient had disease progression in October 2019.  PRIOR THERAPY: 1) Systemic chemotherapy was carboplatin for AUC of 5 on day 1 and etoposide 100 MG/M2 on days 1, 2 and 3 with Neulasta support. Status post 6 cycles with significant response of her disease. 2) Prophylactic cranial irradiation under the care of Dr. Sondra Come on 12/02/2016. 3) stereotactic radiotherapy to the recurrent right upper lobe pulmonary nodule under the care of Dr. Sondra Come completed November 10, 2017. 4)Retreatment with systemic chemotherapy with carboplatin for AUC of 5 on day 1 and etoposide 100 mg/M2 on days 1, 2 and 3 as well as Tecentriq (Atezolizumab) 1200 mg IV every 3 weeks with Neulasta support. First dose June 22, 2018 for disease recurrence. Status post 5 cycles. Starting from cycle #2 her dose of carboplatin will be reduced to AUC of 4 and etoposide 80 mg/M2 on days 1, 2 and 3 in addition to the regular dose of Tecentriq  CURRENT THERAPY: Maintenance treatment with single agent Tecentriq 1200 mg IV every 3 weeks. Status post24cycles.  INTERVAL HISTORY: Debra Kidd 59 y.o. female returns to the clinic for a follow up visit. The patient is feeling well today without any concerning complaints except for itching. The patient continues to tolerate treatment with immunotherapy with Tecentriq well without any adverse effects except itching for which she uses atarax. Denies any fever, chills, night sweats, or weight loss. Denies any chest pain, shortness of breath, or hemoptysis but endorses her baseline cough. Denies any nausea, vomiting, diarrhea, or constipation.  Denies any headache or visual changes. Denies any rashes or skin changes. The patient is here today for evaluation prior to starting cycle # 25   MEDICAL HISTORY: Past Medical History:  Diagnosis Date  . 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 Blood type, Rh positive   . Cancer ([place])   . Chronic pain syndrome   . Depression 08/07/2016  . Encounter for antineoplastic chemotherapy 07/17/2016  . History of external beam radiation therapy 11/19/16-12/02/16   brain 25 Gy in 10 fractions  . Hyperlipidemia   . Hypertension   . Hypertension 08/07/2016  . Osteoporosis   . Small cell lung cancer ([place]) dx'd 05/2016    ALLERGIES:  is allergic to codeine; motrin [ibuprofen]; thiazide-type diuretics; and vicodin [hydrocodone-acetaminophen].  MEDICATIONS:  Current Outpatient Medications  Medication Sig Dispense Refill  . aspirin EC 81 MG tablet Take 1 tablet (81 mg total) by mouth daily. 150 tablet 2  . celecoxib (CELEBREX) 200 MG capsule Take 200 mg by mouth daily as needed for mild pain.     Marland Kitchen clopidogrel (PLAVIX) 75 MG tablet Take 1 tablet (75 mg total) by mouth daily. 30 tablet 0  . diazepam (VALIUM) 5 MG tablet Take 5 mg by mouth 3 (three) times daily as needed for anxiety.   2  . hydrOXYzine (ATARAX/VISTARIL) 10 MG tablet Take 1 tablet (10 mg total) by mouth 3 (three) times daily as needed. 90 tablet 2  . lidocaine-prilocaine (EMLA) cream Apply 1 application topically as needed. 30 g 0  . loperamide (IMODIUM A-D) 2 MG tablet Take 2 mg by mouth 4 (four) times  daily as needed for diarrhea or loose stools.    . magnesium oxide (MAG-OX) 400 (241.3 Mg) MG tablet Take 1 tablet (400 mg total) by mouth daily. 30 tablet 0  . mirtazapine (REMERON) 15 MG tablet Take 1 tablet (15 mg total) by mouth at bedtime. 30 tablet 1  . oxyCODONE-acetaminophen (PERCOCET) 5-325 MG tablet Take 1 tablet by mouth every 6 (six) hours as needed for severe pain. 30 tablet 0  . polyethylene glycol (MIRALAX /  GLYCOLAX) packet Take 17 g by mouth daily. 14 each 0  . potassium chloride 20 MEQ/15ML (10%) SOLN Take 30 mLs (40 mEq total) by mouth daily. 90 mL 0  . prochlorperazine (COMPAZINE) 10 MG tablet Take 1 tablet (10 mg total) by mouth every 6 (six) hours as needed for nausea or vomiting. 30 tablet 0  . senna-docusate (SENOKOT-S) 8.6-50 MG tablet Take 2 tablets by mouth at bedtime. 5 tablet 0  . traMADol (ULTRAM) 50 MG tablet Take 50 mg by mouth every 6 (six) hours as needed for moderate pain.     . Vitamin D, Ergocalciferol, (DRISDOL) 1.25 MG (50000 UT) CAPS capsule Take 1 capsule (50,000 Units total) by mouth every 7 (seven) days. 5 capsule 0  . atorvastatin (LIPITOR) 80 MG tablet Take 1 tablet (80 mg total) by mouth daily at 6 PM. (Patient not taking: Reported on 12/16/2019) 30 tablet 0   No current facility-administered medications for this visit.   Facility-Administered Medications Ordered in Other Visits  Medication Dose Route Frequency Provider Last Rate Last Admin  . 0.9 %  sodium chloride infusion   Intravenous Once [name], MD      . Hue[facility] Bienenstock [facility]) 1,200 mg in sodium chloride 0.9 % 250 mL chemo infusion  1,200 mg Intravenous Once [name], MD      . heparin lock flush 100 unit/mL  500 Units Intracatheter Once PRN [name], MD      . sodium chloride flush (NS) 0.9 % injection 10 mL  10 mL Intracatheter PRN [name], MD        SURGICAL HISTORY:  Past Surgical History:  Procedure Laterality Date  . ABDOMINAL HYSTERECTOMY  1981  . APPENDECTOMY     at age 59  . EYE SURGERY     left  . IR GENERIC HISTORICAL  06/03/2016   IR FLUORO GUIDE PORT INSERTION RIGHT 06/03/2016 [facility]  . IR GENERIC HISTORICAL  06/03/2016   IR US GUIDE VASC ACCESS RIGHT 06/03/2016 [facility]  . SKIN CANCER EXCISION     basal cell carcinoma L side of face    REVIEW OF SYSTEMS:   Review of Systems  Constitutional: Negative for appetite change, chills, fatigue,  fever and unexpected weight change.  HENT: Negative for mouth sores, nosebleeds, sore throat and trouble swallowing.   Eyes: Negative for eye problems and icterus.  Respiratory: Positive for baseline cough. Negative for hemoptysis, [name], constipation, diarrhea, nausea and vomiting.  Genitourinary: Negative for bladder incontinence, difficulty urinating, dysuria, frequency and hematuria.   Musculoskeletal: Negative for back pain, gait problem, neck pain and neck stiffness.  Skin: Positive for itching. Negative for rash.  Neurological: Negative for dizziness, extremity weakness, gait problem, headaches, [name], depression and sleep disturbance. The patient is not nervous/anxious.     PHYSICAL  EXAMINATION:  Blood pressure 107/78, pulse 86, temperature 98.2 F (36.8 C), temperature source Temporal, resp. rate 18, height 5\' 3"  (1.6 m), weight 118 lb 4.8 oz (53.7 kg), SpO2 100 %.  ECOG PERFORMANCE STATUS: 1 - Symptomatic but completely ambulatory  Physical Exam  Constitutional: Oriented to person, place, and time and chronically ill appearing female and in no distress.  HENT:  Head: Normocephalic and atraumatic.  Mouth/Throat: Oropharynx is clear and moist. No oropharyngeal exudate.  Eyes: Conjunctivae are normal. Right eye exhibits no discharge. Left eye exhibits no discharge. No scleral icterus.  Neck: Normal range of motion. Neck supple.  Cardiovascular: Normal rate, regular rhythm, normal heart sounds and intact distal pulses.   Pulmonary/Chest: Effort normal and breath sounds normal. No respiratory distress. No wheezes. No rales.  Abdominal: Soft. Bowel sounds are normal. Exhibits no distension and no mass. There  is no tenderness.  Musculoskeletal: Normal range of motion. Exhibits no edema.  Lymphadenopathy:    No cervical adenopathy.  Neurological: Alert and oriented to person, place, and time. Exhibits normal muscle tone. Gait normal. Coordination normal.  Skin: Skin is warm and dry. No rash noted. Not diaphoretic. No erythema. No pallor.  Psychiatric: Mood, memory and judgment normal.  Vitals reviewed.  LABORATORY DATA: Lab Results  Component Value Date   WBC 8.0 12/16/2019   HGB 11.9 (L) 12/16/2019   HCT 35.8 (L) 12/16/2019   MCV 104.1 (H) 12/16/2019   PLT 286 12/16/2019      Chemistry      Component Value Date/Time   NA 140 12/16/2019 0916   NA 141 08/01/2017 0835   K 3.3 (L) 12/16/2019 0916   K 3.5 08/01/2017 0835   CL 106 12/16/2019 0916   CO2 27 12/16/2019 0916   CO2 25 08/01/2017 0835   BUN 5 (L) 12/16/2019 0916   BUN 8.0 08/01/2017 0835   CREATININE 0.81 12/16/2019 0916   CREATININE 0.9 08/01/2017 0835      Component Value Date/Time   CALCIUM 8.5 (L) 12/16/2019 0916   CALCIUM 9.9 08/01/2017 0835   ALKPHOS 133 (H) 12/16/2019 0916   ALKPHOS 142 08/01/2017 0835   AST 20 12/16/2019 0916   AST 15 08/01/2017 0835   ALT 20 12/16/2019 0916   ALT 19 08/01/2017 0835   BILITOT 0.3 12/16/2019 0916   BILITOT 0.35 08/01/2017 0835       RADIOGRAPHIC STUDIES:  No results found.   ASSESSMENT/PLAN:  This is a very pleasant 59 year old Caucasian female with extensive stage small cell lung cancer. She presented with a right upper lobe lung mass, large right anterior mediastinal and supraclavicular lymphadenopathy as well as a pancreatic andsplenic metastasis. She was diagnosed in September 2017.  The patient underwent systemic chemotherapy with carboplatin and etoposide. She is status post 6 cycles. She tolerated treatment well except for chemotherapy-induced anemia which required pRBCs and platelet transfusions. She had a significant improvement of her disease with  chemotherapy.The patientthenunderwent prophylactic cranial irradiation. She then underwent stereotactic radiotherapy to the right upper lobe and pulmonary nodule.  She had been on observation for 2 years before showing evidence of disease progression.   She recently hadbeen started on systemic chemotherapy with carboplatin, etoposide, and Tecentriq. Starting from cycle #5, she has been on maintenance single agent Tecentriq. She has been tolerating treatment well.She is status post 24cycles of single agent Tecentriq.  Labs were reviewed. Recommend that she proceed with cycle #25 today as scheduled.   I will arrange for a restaging CT scan  prior to her next visit.   We will see her back for a follow up visit in 3 weeks for evaluation and to review her scan results.   She will continue to use atarax for her itching. She requested 25 mg. Discussed that given the sedation, that in times of significant itching she may take 2 tablets of the 10 mg if needed. Would not recommend 25 mg at this point to be taken TID. I have sent a refill to her pharmacy.   She will follow up with Dr. Paulla Dolly for her ingrown toe nail on 4/16 as planned.   The patient was advised to call immediately if she has any concerning symptoms in the interval. The patient voices understanding of current disease status and treatment options and is in agreement with the current care plan. All questions were answered. The patient knows to call the clinic with any problems, questions or concerns. We can certainly see the patient much sooner if necessary   Orders Placed This Encounter  Procedures  . CT Chest W Contrast    Standing Status:   Future    Standing Expiration Date:   12/15/2020    Order Specific Question:   ** REASON FOR EXAM (FREE TEXT)    Answer:   Restaging Lung Cancer    Order Specific Question:   If indicated for the ordered procedure, I authorize the administration of contrast media per Radiology protocol     Answer:   Yes    Order Specific Question:   Is patient pregnant?    Answer:   No    Order Specific Question:   Preferred imaging location?    Answer:   [facility]    Order Specific Question:   Radiology Contrast Protocol - do NOT remove file path    Answer:   \\[email or web address]  . CT Abdomen Pelvis W Contrast    Standing Status:   Future    Standing Expiration Date:   12/15/2020    Order Specific Question:   ** REASON FOR EXAM (FREE TEXT)    Answer:   Restaging Lung Cancer    Order Specific Question:   If indicated for the ordered procedure, I authorize the administration of contrast media per Radiology protocol    Answer:   Yes    Order Specific Question:   Is patient pregnant?    Answer:   No    Order Specific Question:   Preferred imaging location?    Answer:   [facility]    Order Specific Question:   Is Oral Contrast requested for this exam?    Answer:   Yes, Per Radiology protocol    Order Specific Question:   Radiology Contrast Protocol - do NOT remove file path    Answer:   \\[email or web address]     [place], PA-C 12/16/19

## 2019-12-16 ENCOUNTER — Inpatient Hospital Stay: Payer: Medicare Other

## 2019-12-16 ENCOUNTER — Other Ambulatory Visit: Payer: Self-pay

## 2019-12-16 ENCOUNTER — Inpatient Hospital Stay: Payer: Medicare Other | Attending: Internal Medicine | Admitting: Physician Assistant

## 2019-12-16 VITALS — BP 107/78 | HR 86 | Temp 98.2°F | Resp 18 | Ht 63.0 in | Wt 118.3 lb

## 2019-12-16 DIAGNOSIS — F329 Major depressive disorder, single episode, unspecified: Secondary | ICD-10-CM | POA: Diagnosis not present

## 2019-12-16 DIAGNOSIS — C7889 Secondary malignant neoplasm of other digestive organs: Secondary | ICD-10-CM | POA: Insufficient documentation

## 2019-12-16 DIAGNOSIS — E785 Hyperlipidemia, unspecified: Secondary | ICD-10-CM | POA: Insufficient documentation

## 2019-12-16 DIAGNOSIS — R21 Rash and other nonspecific skin eruption: Secondary | ICD-10-CM | POA: Insufficient documentation

## 2019-12-16 DIAGNOSIS — L600 Ingrowing nail: Secondary | ICD-10-CM | POA: Insufficient documentation

## 2019-12-16 DIAGNOSIS — C3491 Malignant neoplasm of unspecified part of right bronchus or lung: Secondary | ICD-10-CM

## 2019-12-16 DIAGNOSIS — Z5112 Encounter for antineoplastic immunotherapy: Secondary | ICD-10-CM | POA: Insufficient documentation

## 2019-12-16 DIAGNOSIS — Z79899 Other long term (current) drug therapy: Secondary | ICD-10-CM | POA: Diagnosis not present

## 2019-12-16 DIAGNOSIS — Z85828 Personal history of other malignant neoplasm of skin: Secondary | ICD-10-CM | POA: Insufficient documentation

## 2019-12-16 DIAGNOSIS — Z9221 Personal history of antineoplastic chemotherapy: Secondary | ICD-10-CM | POA: Insufficient documentation

## 2019-12-16 DIAGNOSIS — M810 Age-related osteoporosis without current pathological fracture: Secondary | ICD-10-CM | POA: Insufficient documentation

## 2019-12-16 DIAGNOSIS — G894 Chronic pain syndrome: Secondary | ICD-10-CM | POA: Diagnosis not present

## 2019-12-16 DIAGNOSIS — Z7982 Long term (current) use of aspirin: Secondary | ICD-10-CM | POA: Diagnosis not present

## 2019-12-16 DIAGNOSIS — C3411 Malignant neoplasm of upper lobe, right bronchus or lung: Secondary | ICD-10-CM | POA: Diagnosis not present

## 2019-12-16 DIAGNOSIS — I10 Essential (primary) hypertension: Secondary | ICD-10-CM | POA: Diagnosis not present

## 2019-12-16 DIAGNOSIS — R5383 Other fatigue: Secondary | ICD-10-CM

## 2019-12-16 DIAGNOSIS — L299 Pruritus, unspecified: Secondary | ICD-10-CM | POA: Diagnosis not present

## 2019-12-16 LAB — CBC WITH DIFFERENTIAL (CANCER CENTER ONLY)
Abs Immature Granulocytes: 0.03 10*3/uL (ref 0.00–0.07)
Basophils Absolute: 0 10*3/uL (ref 0.0–0.1)
Basophils Relative: 0 %
Eosinophils Absolute: 0.2 10*3/uL (ref 0.0–0.5)
Eosinophils Relative: 3 %
HCT: 35.8 % — ABNORMAL LOW (ref 36.0–46.0)
Hemoglobin: 11.9 g/dL — ABNORMAL LOW (ref 12.0–15.0)
Immature Granulocytes: 0 %
Lymphocytes Relative: 24 %
Lymphs Abs: 1.9 10*3/uL (ref 0.7–4.0)
MCH: 34.6 pg — ABNORMAL HIGH (ref 26.0–34.0)
MCHC: 33.2 g/dL (ref 30.0–36.0)
MCV: 104.1 fL — ABNORMAL HIGH (ref 80.0–100.0)
Monocytes Absolute: 0.6 10*3/uL (ref 0.1–1.0)
Monocytes Relative: 8 %
Neutro Abs: 5.2 10*3/uL (ref 1.7–7.7)
Neutrophils Relative %: 65 %
Platelet Count: 286 10*3/uL (ref 150–400)
RBC: 3.44 MIL/uL — ABNORMAL LOW (ref 3.87–5.11)
RDW: 12 % (ref 11.5–15.5)
WBC Count: 8 10*3/uL (ref 4.0–10.5)
nRBC: 0 % (ref 0.0–0.2)

## 2019-12-16 LAB — CMP (CANCER CENTER ONLY)
ALT: 20 U/L (ref 0–44)
AST: 20 U/L (ref 15–41)
Albumin: 3.2 g/dL — ABNORMAL LOW (ref 3.5–5.0)
Alkaline Phosphatase: 133 U/L — ABNORMAL HIGH (ref 38–126)
Anion gap: 7 (ref 5–15)
BUN: 5 mg/dL — ABNORMAL LOW (ref 6–20)
CO2: 27 mmol/L (ref 22–32)
Calcium: 8.5 mg/dL — ABNORMAL LOW (ref 8.9–10.3)
Chloride: 106 mmol/L (ref 98–111)
Creatinine: 0.81 mg/dL (ref 0.44–1.00)
GFR, Est AFR Am: >60 mL/min (ref >60)
GFR, Estimated: >60 mL/min (ref >60)
Glucose, Bld: 86 mg/dL (ref 70–99)
Potassium: 3.3 mmol/L — ABNORMAL LOW (ref 3.5–5.1)
Sodium: 140 mmol/L (ref 135–145)
Total Bilirubin: 0.3 mg/dL (ref 0.3–1.2)
Total Protein: 6.7 g/dL (ref 6.5–8.1)

## 2019-12-16 LAB — TSH: TSH: 2.241 u[IU]/mL (ref 0.308–3.960)

## 2019-12-16 MED ORDER — SODIUM CHLORIDE 0.9 % IV SOLN
Freq: Once | INTRAVENOUS | Status: AC
  Filled 2019-12-16: qty 250

## 2019-12-16 MED ORDER — HYDROXYZINE HCL 10 MG PO TABS: 10.0000 mg | ORAL_TABLET | Freq: Three times a day (TID) | ORAL | 2 refills | Status: DC | PRN

## 2019-12-16 MED ORDER — SODIUM CHLORIDE 0.9% FLUSH
10.0000 mL | INTRAVENOUS | Status: DC | PRN
  Administered 2019-12-16: 10 mL
  Filled 2019-12-16: qty 10

## 2019-12-16 MED ORDER — SODIUM CHLORIDE 0.9 % IV SOLN
1200.0000 mg | Freq: Once | INTRAVENOUS | Status: AC
  Administered 2019-12-16: 1200 mg via INTRAVENOUS
  Filled 2019-12-16: qty 20

## 2019-12-16 MED ORDER — HEPARIN SOD (PORK) LOCK FLUSH 100 UNIT/ML IV SOLN
500.0000 [IU] | Freq: Once | INTRAVENOUS | Status: AC | PRN
  Administered 2019-12-16: 500 [IU]
  Filled 2019-12-16: qty 5

## 2019-12-16 MED ORDER — HEPARIN SOD (PORK) LOCK FLUSH 100 UNIT/ML IV SOLN
500.0000 [IU] | Freq: Once | INTRAVENOUS | Status: DC | PRN
  Filled 2019-12-16: qty 5

## 2019-12-16 NOTE — Patient Instructions (Signed)
[facility] Discharge Instructions for Patients Receiving Chemotherapy  Today you received the following chemotherapy agents: Tecentriq  To help prevent nausea and vomiting after your treatment, we encourage you to take your nausea medication as directed.   If you develop nausea and vomiting that is not controlled by your nausea medication, call the clinic.   BELOW ARE SYMPTOMS THAT SHOULD BE REPORTED IMMEDIATELY:  *FEVER GREATER THAN 100.5 F  *CHILLS WITH OR WITHOUT FEVER  NAUSEA AND VOMITING THAT IS NOT CONTROLLED WITH YOUR NAUSEA MEDICATION  *UNUSUAL SHORTNESS OF BREATH  *UNUSUAL BRUISING OR BLEEDING  TENDERNESS IN MOUTH AND THROAT WITH OR WITHOUT PRESENCE OF ULCERS  *URINARY PROBLEMS  *BOWEL PROBLEMS  UNUSUAL RASH Items with * indicate a potential emergency and should be followed up as soon as possible.  Feel free to call the clinic should you have any questions or concerns. The clinic phone number is [phone].  Please show the CHEMO ALERT CARD at check-in to the Emergency Department and triage nurse.   

## 2019-12-17 ENCOUNTER — Ambulatory Visit (INDEPENDENT_AMBULATORY_CARE_PROVIDER_SITE_OTHER): Payer: Medicare Other | Admitting: Podiatry

## 2019-12-17 ENCOUNTER — Encounter: Payer: Self-pay | Admitting: Podiatry

## 2019-12-17 DIAGNOSIS — B351 Tinea unguium: Secondary | ICD-10-CM

## 2019-12-17 DIAGNOSIS — D689 Coagulation defect, unspecified: Secondary | ICD-10-CM | POA: Diagnosis not present

## 2019-12-17 DIAGNOSIS — M79674 Pain in right toe(s): Secondary | ICD-10-CM | POA: Diagnosis not present

## 2019-12-17 DIAGNOSIS — M79675 Pain in left toe(s): Secondary | ICD-10-CM | POA: Diagnosis not present

## 2019-12-21 ENCOUNTER — Telehealth: Payer: Self-pay | Admitting: Internal Medicine

## 2019-12-21 NOTE — Telephone Encounter (Signed)
Scheduled per 04/15 los, patient has been called and notified.

## 2019-12-22 NOTE — Progress Notes (Signed)
Subjective:   Patient ID: Debra Kidd, female   DOB: 59 y.o.   MRN: [id number]   HPI Patient states my nails are very thickened and elongated and hard for me to cut. States that she cannot take care of this herself and she would like to see if there is something that could be done to help get her out of discomfort and states that she is on blood thinner. Patient does not smoke currently and likes to be active   Review of Systems  All other systems reviewed and are negative.       Objective:  Physical Exam Vitals and nursing note reviewed.  Constitutional:      Appearance: She is well-developed.  Pulmonary:     Effort: Pulmonary effort is normal.  Musculoskeletal:        General: Normal range of motion.  Skin:    General: Skin is warm.  Neurological:     Mental Status: She is alert.     Neurovascular status found to be intact bilateral with patient noted to have adequate range of motion and mild reduction muscle strength bilateral within normal limits. Patient is found to have severely thickened dystrophic nailbeds 1-5 both feet thickened painful and make it hard for her to wear shoe gear comfortably and there is discoloration and debris associated with this. Patient cannot cut them herself and was found to have good digital perfusion and is well oriented     Assessment:  Chronic mycotic nail infection 1-5 both feet with patient who is on blood thinner and has pain associated with this and thickness     Plan:  H&P condition reviewed and discussed risk associated with trimming these herself due to long-term blood thinner usage and thickness of nailbeds. Today I debrided nailbeds 1-5 both feet with no iatrogenic bleeding and I recommended this be done on a continuing basis and I encouraged her to call with questions concerns

## 2019-12-31 NOTE — Progress Notes (Signed)
Pharmacist Chemotherapy Monitoring - Follow Up Assessment    I verify that I have reviewed each item in the below checklist:  . Regimen for the patient is scheduled for the appropriate day and plan matches scheduled date. Marland Kitchen Appropriate non-routine labs are ordered dependent on drug ordered. . If applicable, additional medications reviewed and ordered per protocol based on lifetime cumulative doses and/or treatment regimen.   Plan for follow-up and/or issues identified: No . I-vent associated with next due treatment: No . MD and/or nursing notified: No   Kennith Center, Pharm.D., CPP 12/31/2019@12 :40 PM

## 2020-01-04 ENCOUNTER — Other Ambulatory Visit: Payer: Self-pay

## 2020-01-04 ENCOUNTER — Ambulatory Visit (HOSPITAL_COMMUNITY)
Admission: RE | Admit: 2020-01-04 | Discharge: 2020-01-04 | Disposition: A | Discharge status: Home / Self Care | Payer: Medicare Other | Source: Ambulatory Visit | Attending: Physician Assistant | Admitting: Physician Assistant

## 2020-01-04 ENCOUNTER — Encounter (HOSPITAL_COMMUNITY): Payer: Self-pay

## 2020-01-04 DIAGNOSIS — C3491 Malignant neoplasm of unspecified part of right bronchus or lung: Secondary | ICD-10-CM | POA: Diagnosis present

## 2020-01-04 MED ORDER — HEPARIN SOD (PORK) LOCK FLUSH 100 UNIT/ML IV SOLN
500.0000 [IU] | Freq: Once | INTRAVENOUS | Status: AC
  Administered 2020-01-04: 11:00:00 500 [IU] via INTRAVENOUS

## 2020-01-04 MED ORDER — SODIUM CHLORIDE (PF) 0.9 % IJ SOLN
INTRAMUSCULAR | Status: AC
  Filled 2020-01-04: qty 50

## 2020-01-04 MED ORDER — HEPARIN SOD (PORK) LOCK FLUSH 100 UNIT/ML IV SOLN
INTRAVENOUS | Status: AC
  Filled 2020-01-04: qty 5

## 2020-01-04 MED ORDER — IOHEXOL 300 MG/ML  SOLN
100.0000 mL | Freq: Once | INTRAMUSCULAR | Status: AC | PRN
  Administered 2020-01-04: 100 mL via INTRAVENOUS

## 2020-01-06 ENCOUNTER — Inpatient Hospital Stay: Payer: Medicare Other

## 2020-01-06 ENCOUNTER — Other Ambulatory Visit: Payer: Self-pay

## 2020-01-06 ENCOUNTER — Inpatient Hospital Stay: Payer: Medicare Other | Attending: Internal Medicine | Admitting: Internal Medicine

## 2020-01-06 ENCOUNTER — Encounter: Payer: Self-pay | Admitting: Internal Medicine

## 2020-01-06 VITALS — BP 135/86 | HR 85 | Temp 98.2°F | Resp 18 | Wt 121.9 lb

## 2020-01-06 DIAGNOSIS — C3491 Malignant neoplasm of unspecified part of right bronchus or lung: Secondary | ICD-10-CM

## 2020-01-06 DIAGNOSIS — E785 Hyperlipidemia, unspecified: Secondary | ICD-10-CM | POA: Diagnosis not present

## 2020-01-06 DIAGNOSIS — Z5112 Encounter for antineoplastic immunotherapy: Secondary | ICD-10-CM | POA: Diagnosis not present

## 2020-01-06 DIAGNOSIS — F329 Major depressive disorder, single episode, unspecified: Secondary | ICD-10-CM | POA: Diagnosis not present

## 2020-01-06 DIAGNOSIS — T451X5A Adverse effect of antineoplastic and immunosuppressive drugs, initial encounter: Secondary | ICD-10-CM | POA: Insufficient documentation

## 2020-01-06 DIAGNOSIS — R5383 Other fatigue: Secondary | ICD-10-CM

## 2020-01-06 DIAGNOSIS — C3411 Malignant neoplasm of upper lobe, right bronchus or lung: Secondary | ICD-10-CM | POA: Diagnosis not present

## 2020-01-06 DIAGNOSIS — C7889 Secondary malignant neoplasm of other digestive organs: Secondary | ICD-10-CM | POA: Insufficient documentation

## 2020-01-06 DIAGNOSIS — I10 Essential (primary) hypertension: Secondary | ICD-10-CM | POA: Diagnosis not present

## 2020-01-06 DIAGNOSIS — Z7982 Long term (current) use of aspirin: Secondary | ICD-10-CM | POA: Insufficient documentation

## 2020-01-06 DIAGNOSIS — D6481 Anemia due to antineoplastic chemotherapy: Secondary | ICD-10-CM | POA: Diagnosis not present

## 2020-01-06 DIAGNOSIS — Z9221 Personal history of antineoplastic chemotherapy: Secondary | ICD-10-CM | POA: Insufficient documentation

## 2020-01-06 DIAGNOSIS — G894 Chronic pain syndrome: Secondary | ICD-10-CM | POA: Diagnosis not present

## 2020-01-06 DIAGNOSIS — Z85828 Personal history of other malignant neoplasm of skin: Secondary | ICD-10-CM | POA: Insufficient documentation

## 2020-01-06 DIAGNOSIS — Z79899 Other long term (current) drug therapy: Secondary | ICD-10-CM | POA: Diagnosis not present

## 2020-01-06 DIAGNOSIS — Z923 Personal history of irradiation: Secondary | ICD-10-CM | POA: Insufficient documentation

## 2020-01-06 LAB — CBC WITH DIFFERENTIAL (CANCER CENTER ONLY)
Abs Immature Granulocytes: 0.02 10*3/uL (ref 0.00–0.07)
Basophils Absolute: 0 10*3/uL (ref 0.0–0.1)
Basophils Relative: 0 %
Eosinophils Absolute: 0.3 10*3/uL (ref 0.0–0.5)
Eosinophils Relative: 5 %
HCT: 36.4 % (ref 36.0–46.0)
Hemoglobin: 12.2 g/dL (ref 12.0–15.0)
Immature Granulocytes: 0 %
Lymphocytes Relative: 24 %
Lymphs Abs: 1.7 10*3/uL (ref 0.7–4.0)
MCH: 34.7 pg — ABNORMAL HIGH (ref 26.0–34.0)
MCHC: 33.5 g/dL (ref 30.0–36.0)
MCV: 103.4 fL — ABNORMAL HIGH (ref 80.0–100.0)
Monocytes Absolute: 0.5 10*3/uL (ref 0.1–1.0)
Monocytes Relative: 6 %
Neutro Abs: 4.6 10*3/uL (ref 1.7–7.7)
Neutrophils Relative %: 65 %
Platelet Count: 274 10*3/uL (ref 150–400)
RBC: 3.52 MIL/uL — ABNORMAL LOW (ref 3.87–5.11)
RDW: 12.1 % (ref 11.5–15.5)
WBC Count: 7.2 10*3/uL (ref 4.0–10.5)
nRBC: 0 % (ref 0.0–0.2)

## 2020-01-06 LAB — CMP (CANCER CENTER ONLY)
ALT: 17 U/L (ref 0–44)
AST: 17 U/L (ref 15–41)
Albumin: 3.4 g/dL — ABNORMAL LOW (ref 3.5–5.0)
Alkaline Phosphatase: 101 U/L (ref 38–126)
Anion gap: 10 (ref 5–15)
BUN: 9 mg/dL (ref 6–20)
CO2: 25 mmol/L (ref 22–32)
Calcium: 8.8 mg/dL — ABNORMAL LOW (ref 8.9–10.3)
Chloride: 106 mmol/L (ref 98–111)
Creatinine: 0.8 mg/dL (ref 0.44–1.00)
GFR, Est AFR Am: >60 mL/min (ref >60)
GFR, Estimated: >60 mL/min (ref >60)
Glucose, Bld: 96 mg/dL (ref 70–99)
Potassium: 3.5 mmol/L (ref 3.5–5.1)
Sodium: 141 mmol/L (ref 135–145)
Total Bilirubin: 0.3 mg/dL (ref 0.3–1.2)
Total Protein: 6.9 g/dL (ref 6.5–8.1)

## 2020-01-06 LAB — TSH: TSH: 1.158 u[IU]/mL (ref 0.308–3.960)

## 2020-01-06 MED ORDER — SODIUM CHLORIDE 0.9 % IV SOLN
Freq: Once | INTRAVENOUS | Status: AC
  Filled 2020-01-06: qty 250

## 2020-01-06 MED ORDER — SODIUM CHLORIDE 0.9% FLUSH
10.0000 mL | INTRAVENOUS | Status: DC | PRN
  Administered 2020-01-06: 10 mL
  Filled 2020-01-06: qty 10

## 2020-01-06 MED ORDER — SODIUM CHLORIDE 0.9 % IV SOLN
1200.0000 mg | Freq: Once | INTRAVENOUS | Status: AC
  Administered 2020-01-06: 1200 mg via INTRAVENOUS
  Filled 2020-01-06: qty 20

## 2020-01-06 MED ORDER — HEPARIN SOD (PORK) LOCK FLUSH 100 UNIT/ML IV SOLN
500.0000 [IU] | Freq: Once | INTRAVENOUS | Status: AC | PRN
  Administered 2020-01-06: 500 [IU]
  Filled 2020-01-06: qty 5

## 2020-01-06 NOTE — Patient Instructions (Signed)
[facility] Discharge Instructions for Patients Receiving Chemotherapy  Today you received the following chemotherapy agents: Tecentriq  To help prevent nausea and vomiting after your treatment, we encourage you to take your nausea medication as directed.   If you develop nausea and vomiting that is not controlled by your nausea medication, call the clinic.   BELOW ARE SYMPTOMS THAT SHOULD BE REPORTED IMMEDIATELY:  *FEVER GREATER THAN 100.5 F  *CHILLS WITH OR WITHOUT FEVER  NAUSEA AND VOMITING THAT IS NOT CONTROLLED WITH YOUR NAUSEA MEDICATION  *UNUSUAL SHORTNESS OF BREATH  *UNUSUAL BRUISING OR BLEEDING  TENDERNESS IN MOUTH AND THROAT WITH OR WITHOUT PRESENCE OF ULCERS  *URINARY PROBLEMS  *BOWEL PROBLEMS  UNUSUAL RASH Items with * indicate a potential emergency and should be followed up as soon as possible.  Feel free to call the clinic should you have any questions or concerns. The clinic phone number is [phone].  Please show the CHEMO ALERT CARD at check-in to the Emergency Department and triage nurse.   

## 2020-01-06 NOTE — Patient Instructions (Signed)
Steps to Quit Smoking Smoking tobacco is the leading cause of preventable death. It can affect almost every organ in the body. Smoking puts you and people around you at risk for many serious, long-lasting (chronic) diseases. Quitting smoking can be hard, but it is one of the best things that you can do for your health. It is never too late to quit. How do I get ready to quit? When you decide to quit smoking, make a plan to help you succeed. Before you quit:  Pick a date to quit. Set a date within the next 2 weeks to give you time to prepare.  Write down the reasons why you are quitting. Keep this list in places where you will see it often.  Tell your family, friends, and co-workers that you are quitting. Their support is important.  Talk with your doctor about the choices that may help you quit.  Find out if your health insurance will pay for these treatments.  Know the people, places, things, and activities that make you want to smoke (triggers). Avoid them. What first steps can I take to quit smoking?  Throw away all cigarettes at home, at work, and in your car.  Throw away the things that you use when you smoke, such as ashtrays and lighters.  Clean your car. Make sure to empty the ashtray.  Clean your home, including curtains and carpets. What can I do to help me quit smoking? Talk with your doctor about taking medicines and seeing a counselor at the same time. You are more likely to succeed when you do both.  If you are pregnant or breastfeeding, talk with your doctor about counseling or other ways to quit smoking. Do not take medicine to help you quit smoking unless your doctor tells you to do so. To quit smoking: Quit right away  Quit smoking totally, instead of slowly cutting back on how much you smoke over a period of time.  Go to counseling. You are more likely to quit if you go to counseling sessions regularly. Take medicine You may take medicines to help you quit. Some  medicines need a prescription, and some you can buy over-the-counter. Some medicines may contain a drug called nicotine to replace the nicotine in cigarettes. Medicines may:  Help you to stop having the desire to smoke (cravings).  Help to stop the problems that come when you stop smoking (withdrawal symptoms). Your doctor may ask you to use:  Nicotine patches, gum, or lozenges.  Nicotine inhalers or sprays.  Non-nicotine medicine that is taken by mouth. Find resources Find resources and other ways to help you quit smoking and remain smoke-free after you quit. These resources are most helpful when you use them often. They include:  Online chats with a counselor.  Phone quitlines.  Printed self-help materials.  Support groups or group counseling.  Text messaging programs.  Mobile phone apps. Use apps on your mobile phone or tablet that can help you stick to your quit plan. There are many free apps for mobile phones and tablets as well as websites. Examples include Quit Guide from the CDC and smokefree.gov  What things can I do to make it easier to quit?   Talk to your family and friends. Ask them to support and encourage you.  Call a phone quitline (1-800-QUIT-NOW), reach out to support groups, or work with a counselor.  Ask people who smoke to not smoke around you.  Avoid places that make you want to smoke,   such as: ? Bars. ? Parties. ? Smoke-break areas at work.  Spend time with people who do not smoke.  Lower the stress in your life. Stress can make you want to smoke. Try these things to help your stress: ? Getting regular exercise. ? Doing deep-breathing exercises. ? Doing yoga. ? Meditating. ? Doing a body scan. To do this, close your eyes, focus on one area of your body at a time from head to toe. Notice which parts of your body are tense. Try to relax the muscles in those areas. How will I feel when I quit smoking? Day 1 to 3 weeks Within the first 24 hours,  you may start to have some problems that come from quitting tobacco. These problems are very bad 2-3 days after you quit, but they do not often last for more than 2-3 weeks. You may get these symptoms:  Mood swings.  Feeling restless, nervous, angry, or annoyed.  Trouble concentrating.  Dizziness.  Strong desire for high-sugar foods and nicotine.  Weight gain.  Trouble pooping (constipation).  Feeling like you may vomit (nausea).  Coughing or a sore throat.  Changes in how the medicines that you take for other issues work in your body.  Depression.  Trouble sleeping (insomnia). Week 3 and afterward After the first 2-3 weeks of quitting, you may start to notice more positive results, such as:  Better sense of smell and taste.  Less coughing and sore throat.  Slower heart rate.  Lower blood pressure.  Clearer skin.  Better breathing.  Fewer sick days. Quitting smoking can be hard. Do not give up if you fail the first time. Some people need to try a few times before they succeed. Do your best to stick to your quit plan, and talk with your doctor if you have any questions or concerns. Summary  Smoking tobacco is the leading cause of preventable death. Quitting smoking can be hard, but it is one of the best things that you can do for your health.  When you decide to quit smoking, make a plan to help you succeed.  Quit smoking right away, not slowly over a period of time.  When you start quitting, seek help from your doctor, family, or friends. This information is not intended to replace advice given to you by your health care provider. Make sure you discuss any questions you have with your health care provider. Document Revised: 05/14/2019 Document Reviewed: 11/07/2018 Elsevier Patient Education  2020 [employer]  

## 2020-01-06 NOTE — Progress Notes (Signed)
[facility] [phone]   [phone]  OFFICE PROGRESS NOTE  [name], MD [address] [place] [address]  DIAGNOSIS: Extensive stage (T2a, N3, [name], large right anterior mediastinal and supraclavicular lymphadenopathy as well as pancreatic and splenic metastasis diagnosed in September 2017.  The patient had disease progression in October 2019.  PRIOR THERAPY:  1) Systemic chemotherapy was carboplatin for AUC of 5 on day 1 and etoposide 100 MG/M2 on days 1, 2 and 3 with Neulasta support. Status post 6 cycles with significant response of her disease. 2) Prophylactic cranial irradiation under the care of Dr. Sondra Come on 12/02/2016. 3) stereotactic radiotherapy to the recurrent right upper lobe pulmonary nodule under the care of Dr. Sondra Come completed November 10, 2017. 4) Retreatment with systemic chemotherapy with carboplatin for AUC of 5 on day 1 and etoposide 100 mg/M2 on days 1, 2 and 3 as well as Tecentriq (Atezolizumab) 1200 mg IV every 3 weeks with Neulasta support.  First dose June 22, 2018 for disease recurrence.  Status post 5 cycles.  Starting from cycle #2 her dose of carboplatin will be reduced to AUC of 4 and etoposide 80 mg/M2 on days 1, 2 and 3 in addition to the regular dose of Tecentriq.  CURRENT THERAPY: Maintenance treatment with single agent Tecentriq 1200 mg IV every 3 weeks.  Status post 20 cycles.  INTERVAL HISTORY: Debra Kidd 59 y.o. female returns to the clinic today for follow-up visit.  The patient is feeling fine today with no concerning complaints except for the itching.  She is currently on Atarax 10 mg p.o. 3 times daily.  She tried Atarax 25 mg p.o. but it made her dizzy.  She denied having any current chest pain, shortness of breath, cough or hemoptysis.  She denied having any fever or chills.  She has no nausea, vomiting, diarrhea or constipation.  She  denied having any headache or visual changes.  She has no recent weight loss or night sweats.  She continues to tolerate her treatment with Tecentriq fairly well except for the itching.  The patient had repeat CT scan of the chest, abdomen pelvis performed recently and she is here for evaluation and discussion of her scan results.  MEDICAL HISTORY: Past Medical History:  Diagnosis Date  . 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 Blood type, Rh positive   . Cancer ([facility])   . Chronic pain syndrome   . Depression 08/07/2016  . Encounter for antineoplastic chemotherapy 07/17/2016  . History of external beam radiation therapy 11/19/16-12/02/16   brain 25 Gy in 10 fractions  . Hyperlipidemia   . Hypertension   . Hypertension 08/07/2016  . Osteoporosis   . Small cell lung cancer ([facility]) dx'd 05/2016    ALLERGIES:  is allergic to codeine; motrin [ibuprofen]; thiazide-type diuretics; and vicodin [hydrocodone-acetaminophen].  MEDICATIONS:  Current Outpatient Medications  Medication Sig Dispense Refill  . aspirin EC 81 MG tablet Take 1 tablet (81 mg total) by mouth daily. 150 tablet 2  . atorvastatin (LIPITOR) 80 MG tablet Take 1 tablet (80 mg total) by mouth daily at 6 PM. (Patient not taking: Reported on 12/16/2019) 30 tablet 0  . celecoxib (CELEBREX) 200 MG capsule Take 200 mg by mouth daily as needed for mild pain.     Marland Kitchen clopidogrel (PLAVIX) 75 MG tablet Take 1 tablet (  75 mg total) by mouth daily. 30 tablet 0  . diazepam (VALIUM) 5 MG tablet Take 5 mg by mouth 3 (three) times daily as needed for anxiety.   2  . hydrOXYzine (ATARAX/VISTARIL) 10 MG tablet Take 1 tablet (10 mg total) by mouth 3 (three) times daily as needed. 90 tablet 2  . lidocaine-prilocaine (EMLA) cream Apply 1 application topically as needed. 30 g 0  . loperamide (IMODIUM A-D) 2 MG tablet Take 2 mg by mouth 4 (four) times daily as needed for diarrhea or loose stools.    . magnesium oxide (MAG-OX) 400 (241.3 Mg) MG tablet  Take 1 tablet (400 mg total) by mouth daily. 30 tablet 0  . mirtazapine (REMERON) 15 MG tablet Take 1 tablet (15 mg total) by mouth at bedtime. 30 tablet 1  . oxyCODONE-acetaminophen (PERCOCET) 5-325 MG tablet Take 1 tablet by mouth every 6 (six) hours as needed for severe pain. 30 tablet 0  . polyethylene glycol (MIRALAX / GLYCOLAX) packet Take 17 g by mouth daily. 14 each 0  . potassium chloride 20 MEQ/15ML (10%) SOLN Take 30 mLs (40 mEq total) by mouth daily. 90 mL 0  . prochlorperazine (COMPAZINE) 10 MG tablet Take 1 tablet (10 mg total) by mouth every 6 (six) hours as needed for nausea or vomiting. 30 tablet 0  . senna-docusate (SENOKOT-S) 8.6-50 MG tablet Take 2 tablets by mouth at bedtime. 5 tablet 0  . traMADol (ULTRAM) 50 MG tablet Take 50 mg by mouth every 6 (six) hours as needed for moderate pain.     . Vitamin D, Ergocalciferol, (DRISDOL) 1.25 MG (50000 UT) CAPS capsule Take 1 capsule (50,000 Units total) by mouth every 7 (seven) days. 5 capsule 0   No current facility-administered medications for this visit.    SURGICAL HISTORY:  Past Surgical History:  Procedure Laterality Date  . ABDOMINAL HYSTERECTOMY  1981  . APPENDECTOMY     at age 56  . EYE SURGERY     left  . IR GENERIC HISTORICAL  06/03/2016   IR FLUORO GUIDE PORT INSERTION RIGHT 06/03/2016 [facility]  . IR GENERIC HISTORICAL  06/03/2016   IR US GUIDE VASC ACCESS RIGHT 06/03/2016 [facility]  . SKIN CANCER EXCISION     basal cell carcinoma L side of face    REVIEW OF SYSTEMS:  Constitutional: negative Eyes: negative Ears, nose, mouth, throat, and face: negative Respiratory: negative Cardiovascular: negative Gastrointestinal: negative Genitourinary:negative Integument/breast: positive for pruritus Hematologic/lymphatic: negative Musculoskeletal:negative Neurological: negative Behavioral/Psych: negative Endocrine: negative Allergic/Immunologic: negative   PHYSICAL EXAMINATION: General appearance: alert,  cooperative and no distress Head: Normocephalic, without obvious abnormality, atraumatic Neck: no adenopathy, no JVD, supple, symmetrical, trachea midline and thyroid not enlarged, symmetric, no tenderness/mass/nodules Lymph nodes: Cervical, supraclavicular, and axillary nodes normal. Resp: clear to auscultation bilaterally Back: symmetric, no curvature. ROM normal. No CVA tenderness. Cardio: regular rate and rhythm, S1, S2 normal, no murmur, click, rub or gallop GI: soft, non-tender; bowel sounds normal; no masses,  no organomegaly Extremities: extremities normal, atraumatic, no cyanosis or edema Neurologic: Alert and oriented X 3, normal strength and tone. Normal symmetric reflexes. Normal coordination and gait  ECOG PERFORMANCE STATUS: 1 - Symptomatic but completely ambulatory  Blood pressure 135/86, pulse 85, temperature 98.2 F (36.8 C), temperature source Temporal, resp. rate 18, weight 121 lb 14.4 oz (55.3 kg), SpO2 100 %.  LABORATORY DATA: Lab Results  Component Value Date   WBC 7.2 01/06/2020   HGB 12.2 01/06/2020   HCT  36.4 01/06/2020   MCV 103.4 (H) 01/06/2020   PLT 274 01/06/2020      Chemistry      Component Value Date/Time   NA 140 12/16/2019 0916   NA 141 08/01/2017 0835   K 3.3 (L) 12/16/2019 0916   K 3.5 08/01/2017 0835   CL 106 12/16/2019 0916   CO2 27 12/16/2019 0916   CO2 25 08/01/2017 0835   BUN 5 (L) 12/16/2019 0916   BUN 8.0 08/01/2017 0835   CREATININE 0.81 12/16/2019 0916   CREATININE 0.9 08/01/2017 0835      Component Value Date/Time   CALCIUM 8.5 (L) 12/16/2019 0916   CALCIUM 9.9 08/01/2017 0835   ALKPHOS 133 (H) 12/16/2019 0916   ALKPHOS 142 08/01/2017 0835   AST 20 12/16/2019 0916   AST 15 08/01/2017 0835   ALT 20 12/16/2019 0916   ALT 19 08/01/2017 0835   BILITOT 0.3 12/16/2019 0916   BILITOT 0.35 08/01/2017 0835       RADIOGRAPHIC STUDIES: CT Chest W Contrast  Result Date: 01/04/2020 CLINICAL DATA:  Small-cell lung cancer.  Restaging. EXAM: CT CHEST, ABDOMEN, AND PELVIS WITH CONTRAST TECHNIQUE: Multidetector CT imaging of the chest, abdomen and pelvis was performed following the standard protocol during bolus administration of intravenous contrast. CONTRAST:  197mL OMNIPAQUE IOHEXOL 300 MG/ML  SOLN COMPARISON:  11/02/2019 FINDINGS: CT CHEST FINDINGS Cardiovascular: The heart size is normal. No substantial pericardial effusion. Coronary artery calcification is evident. Atherosclerotic calcification is noted in the wall of the thoracic aorta. Right Port-A-Cath tip is positioned at the SVC/RA junction. Mediastinum/Nodes: No mediastinal lymphadenopathy. 9 mm short axis right hilar node measured previously is stable at 9 mm today (image 24/series 2). No left hilar lymphadenopathy. The esophagus has normal imaging features. There is no axillary lymphadenopathy. Lungs/Pleura: Post treatment scarring in the right lung apex is stable. Centrilobular emphsyema noted. Subsegmental atelectasis noted in the dependent lung bases. No new suspicious nodule or mass. No focal airspace consolidation. No pleural effusion. Musculoskeletal: No worrisome lytic or sclerotic osseous abnormality. Multilevel thoracic compression deformity is similar to prior. CT ABDOMEN PELVIS FINDINGS Hepatobiliary: Small area of low attenuation in the anterior liver, adjacent to the falciform ligament, is in a characteristic location for focal fatty deposition. There is no evidence for gallstones, gallbladder wall thickening, or pericholecystic fluid. No intrahepatic or extrahepatic biliary dilation. Pancreas: No focal mass lesion. No dilatation of the main duct. No intraparenchymal cyst. No peripancreatic edema. Spleen: No splenomegaly. No focal mass lesion. Adrenals/Urinary Tract: No adrenal nodule or mass. Kidneys unremarkable. No evidence for hydroureter. The urinary bladder appears normal for the degree of distention. Stomach/Bowel: Stomach is unremarkable. No gastric wall  thickening. No evidence of outlet obstruction. Duodenum is normally positioned as is the ligament of Treitz. No small bowel wall thickening. No small bowel dilatation. The terminal ileum is normal. The appendix is not visualized, but there is no edema or inflammation in the region of the cecum. No gross colonic mass. No colonic wall thickening. Vascular/Lymphatic: There is abdominal aortic atherosclerosis without aneurysm. There is no gastrohepatic or hepatoduodenal ligament lymphadenopathy. No retroperitoneal or mesenteric lymphadenopathy. No pelvic sidewall lymphadenopathy. Reproductive: Uterus surgically absent.  There is no adnexal mass. Other: No intraperitoneal free fluid. Musculoskeletal: No worrisome lytic or sclerotic osseous abnormality. L2 compression deformity is stable in the interval. IMPRESSION: 1. Stable exam. No new or progressive findings in the chest, abdomen, or pelvis. Specifically, no findings to suggest recurrent or metastatic disease. 2. Aortic Atherosclerosis (ICD10-I70.0) and  Emphysema (ICD10-J43.9). Electronically Signed   By: Misty Stanley M.D.   On: 01/04/2020 14:39   CT Abdomen Pelvis W Contrast  Result Date: 01/04/2020 CLINICAL DATA:  Small-cell lung cancer. Restaging. EXAM: CT CHEST, ABDOMEN, AND PELVIS WITH CONTRAST TECHNIQUE: Multidetector CT imaging of the chest, abdomen and pelvis was performed following the standard protocol during bolus administration of intravenous contrast. CONTRAST:  117mL OMNIPAQUE IOHEXOL 300 MG/ML  SOLN COMPARISON:  11/02/2019 FINDINGS: CT CHEST FINDINGS Cardiovascular: The heart size is normal. No substantial pericardial effusion. Coronary artery calcification is evident. Atherosclerotic calcification is noted in the wall of the thoracic aorta. Right Port-A-Cath tip is positioned at the SVC/RA junction. Mediastinum/Nodes: No mediastinal lymphadenopathy. 9 mm short axis right hilar node measured previously is stable at 9 mm today (image 24/series 2). No  left hilar lymphadenopathy. The esophagus has normal imaging features. There is no axillary lymphadenopathy. Lungs/Pleura: Post treatment scarring in the right lung apex is stable. Centrilobular emphsyema noted. Subsegmental atelectasis noted in the dependent lung bases. No new suspicious nodule or mass. No focal airspace consolidation. No pleural effusion. Musculoskeletal: No worrisome lytic or sclerotic osseous abnormality. Multilevel thoracic compression deformity is similar to prior. CT ABDOMEN PELVIS FINDINGS Hepatobiliary: Small area of low attenuation in the anterior liver, adjacent to the falciform ligament, is in a characteristic location for focal fatty deposition. There is no evidence for gallstones, gallbladder wall thickening, or pericholecystic fluid. No intrahepatic or extrahepatic biliary dilation. Pancreas: No focal mass lesion. No dilatation of the main duct. No intraparenchymal cyst. No peripancreatic edema. Spleen: No splenomegaly. No focal mass lesion. Adrenals/Urinary Tract: No adrenal nodule or mass. Kidneys unremarkable. No evidence for hydroureter. The urinary bladder appears normal for the degree of distention. Stomach/Bowel: Stomach is unremarkable. No gastric wall thickening. No evidence of outlet obstruction. Duodenum is normally positioned as is the ligament of Treitz. No small bowel wall thickening. No small bowel dilatation. The terminal ileum is normal. The appendix is not visualized, but there is no edema or inflammation in the region of the cecum. No gross colonic mass. No colonic wall thickening. Vascular/Lymphatic: There is abdominal aortic atherosclerosis without aneurysm. There is no gastrohepatic or hepatoduodenal ligament lymphadenopathy. No retroperitoneal or mesenteric lymphadenopathy. No pelvic sidewall lymphadenopathy. Reproductive: Uterus surgically absent.  There is no adnexal mass. Other: No intraperitoneal free fluid. Musculoskeletal: No worrisome lytic or sclerotic  osseous abnormality. L2 compression deformity is stable in the interval. IMPRESSION: 1. Stable exam. No new or progressive findings in the chest, abdomen, or pelvis. Specifically, no findings to suggest recurrent or metastatic disease. 2. Aortic Atherosclerosis (ICD10-I70.0) and Emphysema (ICD10-J43.9). Electronically Signed   By: Misty Stanley M.D.   On: 01/04/2020 14:39    ASSESSMENT AND PLAN:  This is a very pleasant 59 years old white female with extensive stage small cell lung cancer status post systemic chemotherapy with carbo platinum and etoposide for 6 cycles and the patient rotated her treatment well except for chemotherapy-induced anemia and requirement for PRBCs and platelet transfusion. She had significant improvement in her disease with the chemotherapy. She also had prophylactic cranial irradiation. She also underwent stereotactic radiotherapy to progressive right upper lobe pulmonary nodule. The patient has been in observation for close to 2 years. Repeat CT scan of the chest, abdomen and pelvis performed recently showed evidence for disease progression in the abdomen. The patient was started on systemic chemotherapy again with carboplatin, etoposide and Tecentriq status post 25 cycles.  Starting from cycle #5 the patient is treated  with maintenance single agent Tecentriq. The patient has been tolerating this treatment well with no concerning adverse effects except for pruritus. She had repeat CT scan of the chest, abdomen pelvis performed recently.  I personally and independently reviewed the scans and discussed the results with the patient today. Her scan showed no concerning findings for disease progression. I recommended for the patient to continue her current treatment with maintenance Tecentriq and she will proceed with cycle #26 of her treatment today. For the itching she will continue on Atarax on as-needed basis. For the left big toe ingrown nail, she was seen by podiatry and  had removal of her nail.  She is feeling much better. The patient will come back for follow-up visit in 3 weeks for evaluation before the next cycle of her treatment. She was advised to call immediately if she has any concerning symptoms in the interval. The patient voices understanding of current disease status and treatment options and is in agreement with the current care plan. All questions were answered. The patient knows to call the clinic with any problems, questions or concerns. We can certainly see the patient much sooner if necessary.  Disclaimer: This note was dictated with voice recognition software. Similar sounding words can inadvertently be transcribed and may not be corrected upon review.

## 2020-01-21 NOTE — Progress Notes (Signed)
Pharmacist Chemotherapy Monitoring - Follow Up Assessment    I verify that I have reviewed each item in the below checklist:  . Regimen for the patient is scheduled for the appropriate day and plan matches scheduled date. Marland Kitchen Appropriate non-routine labs are ordered dependent on drug ordered. . If applicable, additional medications reviewed and ordered per protocol based on lifetime cumulative doses and/or treatment regimen.   Plan for follow-up and/or issues identified: No . I-vent associated with next due treatment: No . MD and/or nursing notified: No  Robert Sperl K 01/21/2020 9:08 AM

## 2020-01-25 NOTE — Progress Notes (Signed)
Debra Kidd  [name], MD [address] [place] [address]  DIAGNOSIS: Extensive stage (T2a, N3, [name], large right anterior mediastinal and supraclavicular lymphadenopathy as well as pancreatic and splenic metastasis diagnosed in September 2017. The patient had disease progression in October 2019.  PRIOR THERAPY: 1) Systemic chemotherapy was carboplatin for AUC of 5 on day 1 and etoposide 100 MG/M2 on days 1, 2 and 3 with Neulasta support. Status post 6 cycles with significant response of her disease. 2) Prophylactic cranial irradiation under the care of Dr. Sondra Come on 12/02/2016. 3) stereotactic radiotherapy to the recurrent right upper lobe pulmonary nodule under the care of Dr. Sondra Come completed November 10, 2017. 4)Retreatment with systemic chemotherapy with carboplatin for AUC of 5 on day 1 and etoposide 100 mg/M2 on days 1, 2 and 3 as well as Tecentriq (Atezolizumab) 1200 mg IV every 3 weeks with Neulasta support. First dose June 22, 2018 for disease recurrence. Status post 5 cycles. Starting from cycle #2 her dose of carboplatin will be reduced to AUC of 4 and etoposide 80 mg/M2 on days 1, 2 and 3 in addition to the regular dose of Tecentriq  CURRENT THERAPY: Maintenance treatment with single agent Tecentriq 1200 mg IV every 3 weeks. Status post26cycles  INTERVAL HISTORY: Debra Kidd 59 y.o. female returns to the clinic for a follow up visit. The patient is feeling well today without any concerning complaints. The patient continues to tolerate treatment with immunotherapy with Tecentriqwell without any adverse effects except itching for which she uses atarax. Her PCP send a prescription for 25 mg of atarax but it caused her to feel dizzy. Denies any fever, chills, night sweats, or weight loss. Denies any chest pain, shortness of breath, or hemoptysisbut endorses her baseline  cough. Denies any nausea, vomiting, diarrhea, or constipation. Denies any headache or visual changes. She did fall the other day but states she tripped over toilet paper. She did not injure herself or hit her head. She is on Plavix. Denies any rashes or skin changes. The patient is here today for evaluation prior to starting cycle #27  MEDICAL HISTORY: Past Medical History:  Diagnosis Date  . 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 Blood type, Rh positive   . Cancer ([place])   . Chronic pain syndrome   . Depression 08/07/2016  . Encounter for antineoplastic chemotherapy 07/17/2016  . History of external beam radiation therapy 11/19/16-12/02/16   brain 25 Gy in 10 fractions  . Hyperlipidemia   . Hypertension   . Hypertension 08/07/2016  . Osteoporosis   . Small cell lung cancer ([place]) dx'd 05/2016    ALLERGIES:  is allergic to codeine; motrin [ibuprofen]; thiazide-type diuretics; and vicodin [hydrocodone-acetaminophen].  MEDICATIONS:  Current Outpatient Medications  Medication Sig Dispense Refill  . aspirin EC 81 MG tablet Take 1 tablet (81 mg total) by mouth daily. 150 tablet 2  . clopidogrel (PLAVIX) 75 MG tablet Take 1 tablet (75 mg total) by mouth daily. 30 tablet 0  . diazepam (VALIUM) 5 MG tablet Take 5 mg by mouth 3 (three) times daily as needed for anxiety.   2  . hydrOXYzine (ATARAX/VISTARIL) 10 MG tablet Take 1 tablet (10 mg total) by mouth 3 (three) times daily as needed. 90 tablet 2  . lidocaine-prilocaine (EMLA) cream Apply 1 application topically as needed. 30 g 0  .  oxyCODONE-acetaminophen (PERCOCET) 5-325 MG tablet Take 1 tablet by mouth every 6 (six) hours as needed for severe pain. 30 tablet 0  . polyethylene glycol (MIRALAX / GLYCOLAX) packet Take 17 g by mouth daily. (Patient not taking: Reported on 01/06/2020) 14 each 0  . prochlorperazine (COMPAZINE) 10 MG tablet Take 1 tablet (10 mg total) by mouth every 6 (six) hours as needed for nausea or vomiting. (Patient  not taking: Reported on 01/06/2020) 30 tablet 0  . traMADol (ULTRAM) 50 MG tablet Take 50 mg by mouth every 6 (six) hours as needed for moderate pain.     . Vitamin D, Ergocalciferol, (DRISDOL) 1.25 MG (50000 UT) CAPS capsule Take 1 capsule (50,000 Units total) by mouth every 7 (seven) days. 5 capsule 0   No current facility-administered medications for this visit.    SURGICAL HISTORY:  Past Surgical History:  Procedure Laterality Date  . ABDOMINAL HYSTERECTOMY  1981  . APPENDECTOMY     at age 19  . EYE SURGERY     left  . IR GENERIC HISTORICAL  06/03/2016   IR FLUORO GUIDE PORT INSERTION RIGHT 06/03/2016 [facility]  . IR GENERIC HISTORICAL  06/03/2016   IR US GUIDE VASC ACCESS RIGHT 06/03/2016 [facility]  . SKIN CANCER EXCISION     basal cell carcinoma L side of face    REVIEW OF SYSTEMS:   Review of Systems  Constitutional: Negative for appetite change, chills, fatigue, fever and unexpected weight change.  HENT: Negative for mouth sores, nosebleeds, sore throat and trouble swallowing.  Eyes: Negative for eye problems and icterus.  Respiratory:Positive for baseline cough.Negative for hemoptysis, shortness of breath and wheezing.  Cardiovascular: Negative for chest pain and leg swelling.  Gastrointestinal: Negative for abdominal pain, constipation, diarrhea, nausea and vomiting.  Genitourinary: Negative for bladder incontinence, difficulty urinating, dysuria, frequency and hematuria.  Musculoskeletal: Negative for back pain, gait problem, neck pain and neck stiffness.  Skin:Positive for itching.Negative for rash.  Neurological: Positive for limping secondary to history of stroke. Negative for dizziness, extremity weakness, headaches, [name], depression and sleep disturbance. The patient is not nervous/anxious.    PHYSICAL  EXAMINATION:  Blood pressure 100/79, pulse 81, temperature 97.9 F (36.6 C), temperature source Temporal, resp. rate 20, height 5\' 3"  (1.6 m), weight 122 lb 4.8 oz (55.5 kg), SpO2 100 %.  ECOG PERFORMANCE STATUS: 1 - Symptomatic but completely ambulatory  Physical Exam  Constitutional: Oriented to person, place, and time and well-developed, well-nourished, and in no distress.  Constitutional: Oriented to person, place, and time andchronically ill appearing femaleand in no distress.  HENT:  Head: Normocephalic and atraumatic.  Mouth/Throat: Oropharynx is clear and moist. No oropharyngeal exudate.  Eyes: Conjunctivae are normal. Right eye exhibits no discharge. Left eye exhibits no discharge. No scleral icterus.  Neck: Normal range of motion. Neck supple.  Cardiovascular: Normal rate, regular rhythm, normal heart sounds and intact distal pulses.  Pulmonary/Chest: Effort normal and breath sounds normal. No respiratory distress. No wheezes. No rales.  Abdominal: Soft. Bowel sounds are normal. Exhibits no distension and no mass. There is no tenderness.  Musculoskeletal: Normal range of motion. Exhibits no edema.  Lymphadenopathy:  No cervical adenopathy.  Neurological: Alert and oriented to person, place, and time. Exhibits normal muscle tone. Positive for limping due to history of stroke.  Skin: Skin is warm and dry. No rash noted. Not diaphoretic. No erythema. No  pallor.  Psychiatric: Mood, memory and judgment normal.  Vitals reviewed.  LABORATORY DATA: Lab Results  Component Value Date   WBC 7.2 01/27/2020   HGB 12.0 01/27/2020   HCT 34.9 (L) 01/27/2020   MCV 101.2 (H) 01/27/2020   PLT 270 01/27/2020      Chemistry      Component Value Date/Time   NA 141 01/06/2020 0850   NA 141 08/01/2017 0835   K 3.5 01/06/2020 0850   K 3.5 08/01/2017 0835   CL 106 01/06/2020 0850   CO2 25 01/06/2020 0850   CO2 25 08/01/2017 0835   BUN 9 01/06/2020 0850   BUN 8.0 08/01/2017 0835    CREATININE 0.80 01/06/2020 0850   CREATININE 0.9 08/01/2017 0835      Component Value Date/Time   CALCIUM 8.8 (L) 01/06/2020 0850   CALCIUM 9.9 08/01/2017 0835   ALKPHOS 101 01/06/2020 0850   ALKPHOS 142 08/01/2017 0835   AST 17 01/06/2020 0850   AST 15 08/01/2017 0835   ALT 17 01/06/2020 0850   ALT 19 08/01/2017 0835   BILITOT 0.3 01/06/2020 0850   BILITOT 0.35 08/01/2017 0835       RADIOGRAPHIC STUDIES:  CT Chest W Contrast  Result Date: 01/04/2020 CLINICAL DATA:  Small-cell lung cancer. Restaging. EXAM: CT CHEST, ABDOMEN, AND PELVIS WITH CONTRAST TECHNIQUE: Multidetector CT imaging of the chest, abdomen and pelvis was performed following the standard protocol during bolus administration of intravenous contrast. CONTRAST:  170mL OMNIPAQUE IOHEXOL 300 MG/ML  SOLN COMPARISON:  11/02/2019 FINDINGS: CT CHEST FINDINGS Cardiovascular: The heart size is normal. No substantial pericardial effusion. Coronary artery calcification is evident. Atherosclerotic calcification is noted in the wall of the thoracic aorta. Right Port-A-Cath tip is positioned at the SVC/RA junction. Mediastinum/Nodes: No mediastinal lymphadenopathy. 9 mm short axis right hilar node measured previously is stable at 9 mm today (image 24/series 2). No left hilar lymphadenopathy. The esophagus has normal imaging features. There is no axillary lymphadenopathy. Lungs/Pleura: Post treatment scarring in the right lung apex is stable. Centrilobular emphsyema noted. Subsegmental atelectasis noted in the dependent lung bases. No new suspicious nodule or mass. No focal airspace consolidation. No pleural effusion. Musculoskeletal: No worrisome lytic or sclerotic osseous abnormality. Multilevel thoracic compression deformity is similar to prior. CT ABDOMEN PELVIS FINDINGS Hepatobiliary: Small area of low attenuation in the anterior liver, adjacent to the falciform ligament, is in a characteristic location for focal fatty deposition. There is  no evidence for gallstones, gallbladder wall thickening, or pericholecystic fluid. No intrahepatic or extrahepatic biliary dilation. Pancreas: No focal mass lesion. No dilatation of the main duct. No intraparenchymal cyst. No peripancreatic edema. Spleen: No splenomegaly. No focal mass lesion. Adrenals/Urinary Tract: No adrenal nodule or mass. Kidneys unremarkable. No evidence for hydroureter. The urinary bladder appears normal for the degree of distention. Stomach/Bowel: Stomach is unremarkable. No gastric wall thickening. No evidence of outlet obstruction. Duodenum is normally positioned as is the ligament of Treitz. No small bowel wall thickening. No small bowel dilatation. The terminal ileum is normal. The appendix is not visualized, but there is no edema or inflammation in the region of the cecum. No gross colonic mass. No colonic wall thickening. Vascular/Lymphatic: There is abdominal aortic atherosclerosis without aneurysm. There is no gastrohepatic or hepatoduodenal ligament lymphadenopathy. No retroperitoneal or mesenteric lymphadenopathy. No pelvic sidewall lymphadenopathy. Reproductive: Uterus surgically absent.  There is no adnexal mass. Other: No intraperitoneal free fluid. Musculoskeletal: No worrisome lytic or sclerotic osseous abnormality. L2 compression deformity is stable  in the interval. IMPRESSION: 1. Stable exam. No new or progressive findings in the chest, abdomen, or pelvis. Specifically, no findings to suggest recurrent or metastatic disease. 2. Aortic Atherosclerosis (ICD10-I70.0) and Emphysema (ICD10-J43.9). Electronically Signed   By: Misty Stanley M.D.   On: 01/04/2020 14:39   CT Abdomen Pelvis W Contrast  Result Date: 01/04/2020 CLINICAL DATA:  Small-cell lung cancer. Restaging. EXAM: CT CHEST, ABDOMEN, AND PELVIS WITH CONTRAST TECHNIQUE: Multidetector CT imaging of the chest, abdomen and pelvis was performed following the standard protocol during bolus administration of intravenous  contrast. CONTRAST:  162mL OMNIPAQUE IOHEXOL 300 MG/ML  SOLN COMPARISON:  11/02/2019 FINDINGS: CT CHEST FINDINGS Cardiovascular: The heart size is normal. No substantial pericardial effusion. Coronary artery calcification is evident. Atherosclerotic calcification is noted in the wall of the thoracic aorta. Right Port-A-Cath tip is positioned at the SVC/RA junction. Mediastinum/Nodes: No mediastinal lymphadenopathy. 9 mm short axis right hilar node measured previously is stable at 9 mm today (image 24/series 2). No left hilar lymphadenopathy. The esophagus has normal imaging features. There is no axillary lymphadenopathy. Lungs/Pleura: Post treatment scarring in the right lung apex is stable. Centrilobular emphsyema noted. Subsegmental atelectasis noted in the dependent lung bases. No new suspicious nodule or mass. No focal airspace consolidation. No pleural effusion. Musculoskeletal: No worrisome lytic or sclerotic osseous abnormality. Multilevel thoracic compression deformity is similar to prior. CT ABDOMEN PELVIS FINDINGS Hepatobiliary: Small area of low attenuation in the anterior liver, adjacent to the falciform ligament, is in a characteristic location for focal fatty deposition. There is no evidence for gallstones, gallbladder wall thickening, or pericholecystic fluid. No intrahepatic or extrahepatic biliary dilation. Pancreas: No focal mass lesion. No dilatation of the main duct. No intraparenchymal cyst. No peripancreatic edema. Spleen: No splenomegaly. No focal mass lesion. Adrenals/Urinary Tract: No adrenal nodule or mass. Kidneys unremarkable. No evidence for hydroureter. The urinary bladder appears normal for the degree of distention. Stomach/Bowel: Stomach is unremarkable. No gastric wall thickening. No evidence of outlet obstruction. Duodenum is normally positioned as is the ligament of Treitz. No small bowel wall thickening. No small bowel dilatation. The terminal ileum is normal. The appendix is not  visualized, but there is no edema or inflammation in the region of the cecum. No gross colonic mass. No colonic wall thickening. Vascular/Lymphatic: There is abdominal aortic atherosclerosis without aneurysm. There is no gastrohepatic or hepatoduodenal ligament lymphadenopathy. No retroperitoneal or mesenteric lymphadenopathy. No pelvic sidewall lymphadenopathy. Reproductive: Uterus surgically absent.  There is no adnexal mass. Other: No intraperitoneal free fluid. Musculoskeletal: No worrisome lytic or sclerotic osseous abnormality. L2 compression deformity is stable in the interval. IMPRESSION: 1. Stable exam. No new or progressive findings in the chest, abdomen, or pelvis. Specifically, no findings to suggest recurrent or metastatic disease. 2. Aortic Atherosclerosis (ICD10-I70.0) and Emphysema (ICD10-J43.9). Electronically Signed   By: Misty Stanley M.D.   On: 01/04/2020 14:39     ASSESSMENT/PLAN:  This is a very pleasant 59 year old Caucasian female with extensive stage small cell lung cancer. She presented with a right upper lobe lung mass, large right anterior mediastinal and supraclavicular lymphadenopathy as well as a pancreatic andsplenic metastasis. She was diagnosed in September 2017.  The patient underwent systemic chemotherapy with carboplatin and etoposide. She is status post 6 cycles. She tolerated treatment well except for chemotherapy-induced anemia which required pRBCs and platelet transfusions. She had a significant improvement of her disease with chemotherapy.The patientthenunderwent prophylactic cranial irradiation. She then underwent stereotactic radiotherapy to the right upper lobe and pulmonary  nodule.  She had been on observation for 2 years before showing evidence of disease progression.   She recently hadbeen started on systemic chemotherapy with carboplatin, etoposide, and Tecentriq. Starting from cycle #5, she has been on maintenance single agent Tecentriq. She  has been tolerating treatment well.She is status post26cycles of single agent Tecentriq.  Labs were reviewed. Recommend that she proceed with cycle #27 today as scheduled.   We will see her back for a follow up visit in 3 weeks for evaluation before starting cycle #28.   She will continue to take atarax for itching.   The patient was advised to call immediately if she has any concerning symptoms in the interval. The patient voices understanding of current disease status and treatment options and is in agreement with the current care plan. All questions were answered. The patient knows to call the clinic with any problems, questions or concerns. We can certainly see the patient much sooner if necessary         No orders of the defined types were placed in this encounter.    [place] Rollyson L Zaahir Pickney, PA-C 01/27/20

## 2020-01-27 ENCOUNTER — Inpatient Hospital Stay: Payer: Medicare Other

## 2020-01-27 ENCOUNTER — Inpatient Hospital Stay (HOSPITAL_BASED_OUTPATIENT_CLINIC_OR_DEPARTMENT_OTHER): Payer: Medicare Other | Admitting: Physician Assistant

## 2020-01-27 ENCOUNTER — Other Ambulatory Visit: Payer: Self-pay

## 2020-01-27 VITALS — BP 100/79 | HR 81 | Temp 97.9°F | Resp 20 | Ht 63.0 in | Wt 122.3 lb

## 2020-01-27 DIAGNOSIS — C3491 Malignant neoplasm of unspecified part of right bronchus or lung: Secondary | ICD-10-CM | POA: Diagnosis not present

## 2020-01-27 DIAGNOSIS — Z5112 Encounter for antineoplastic immunotherapy: Secondary | ICD-10-CM | POA: Diagnosis not present

## 2020-01-27 DIAGNOSIS — R5383 Other fatigue: Secondary | ICD-10-CM

## 2020-01-27 LAB — CMP (CANCER CENTER ONLY)
ALT: 14 U/L (ref 0–44)
AST: 16 U/L (ref 15–41)
Albumin: 3.6 g/dL (ref 3.5–5.0)
Alkaline Phosphatase: 86 U/L (ref 38–126)
Anion gap: 10 (ref 5–15)
BUN: 8 mg/dL (ref 6–20)
CO2: 27 mmol/L (ref 22–32)
Calcium: 8.8 mg/dL — ABNORMAL LOW (ref 8.9–10.3)
Chloride: 104 mmol/L (ref 98–111)
Creatinine: 0.79 mg/dL (ref 0.44–1.00)
GFR, Est AFR Am: >60 mL/min (ref >60)
GFR, Estimated: >60 mL/min (ref >60)
Glucose, Bld: 98 mg/dL (ref 70–99)
Potassium: 3.6 mmol/L (ref 3.5–5.1)
Sodium: 141 mmol/L (ref 135–145)
Total Bilirubin: 0.4 mg/dL (ref 0.3–1.2)
Total Protein: 6.9 g/dL (ref 6.5–8.1)

## 2020-01-27 LAB — CBC WITH DIFFERENTIAL/PLATELET
Abs Immature Granulocytes: 0.03 10*3/uL (ref 0.00–0.07)
Basophils Absolute: 0 10*3/uL (ref 0.0–0.1)
Basophils Relative: 0 %
Eosinophils Absolute: 0.2 10*3/uL (ref 0.0–0.5)
Eosinophils Relative: 3 %
HCT: 34.9 % — ABNORMAL LOW (ref 36.0–46.0)
Hemoglobin: 12 g/dL (ref 12.0–15.0)
Immature Granulocytes: 0 %
Lymphocytes Relative: 24 %
Lymphs Abs: 1.8 10*3/uL (ref 0.7–4.0)
MCH: 34.8 pg — ABNORMAL HIGH (ref 26.0–34.0)
MCHC: 34.4 g/dL (ref 30.0–36.0)
MCV: 101.2 fL — ABNORMAL HIGH (ref 80.0–100.0)
Monocytes Absolute: 0.5 10*3/uL (ref 0.1–1.0)
Monocytes Relative: 7 %
Neutro Abs: 4.7 10*3/uL (ref 1.7–7.7)
Neutrophils Relative %: 66 %
Platelets: 270 10*3/uL (ref 150–400)
RBC: 3.45 MIL/uL — ABNORMAL LOW (ref 3.87–5.11)
RDW: 11.9 % (ref 11.5–15.5)
WBC: 7.2 10*3/uL (ref 4.0–10.5)
nRBC: 0 % (ref 0.0–0.2)

## 2020-01-27 MED ORDER — HEPARIN SOD (PORK) LOCK FLUSH 100 UNIT/ML IV SOLN
500.0000 [IU] | Freq: Once | INTRAVENOUS | Status: AC | PRN
  Administered 2020-01-27: 500 [IU]
  Filled 2020-01-27: qty 5

## 2020-01-27 MED ORDER — SODIUM CHLORIDE 0.9 % IV SOLN
1200.0000 mg | Freq: Once | INTRAVENOUS | Status: AC
  Administered 2020-01-27: 1200 mg via INTRAVENOUS
  Filled 2020-01-27: qty 20

## 2020-01-27 MED ORDER — SODIUM CHLORIDE 0.9% FLUSH
10.0000 mL | INTRAVENOUS | Status: DC | PRN
  Administered 2020-01-27: 10 mL
  Filled 2020-01-27: qty 10

## 2020-01-27 MED ORDER — SODIUM CHLORIDE 0.9 % IV SOLN
Freq: Once | INTRAVENOUS | Status: AC
  Filled 2020-01-27: qty 250

## 2020-01-27 NOTE — Patient Instructions (Signed)
[facility] Discharge Instructions for Patients Receiving Chemotherapy  Today you received the following chemotherapy agents: Tecentriq  To help prevent nausea and vomiting after your treatment, we encourage you to take your nausea medication as directed.   If you develop nausea and vomiting that is not controlled by your nausea medication, call the clinic.   BELOW ARE SYMPTOMS THAT SHOULD BE REPORTED IMMEDIATELY:  *FEVER GREATER THAN 100.5 F  *CHILLS WITH OR WITHOUT FEVER  NAUSEA AND VOMITING THAT IS NOT CONTROLLED WITH YOUR NAUSEA MEDICATION  *UNUSUAL SHORTNESS OF BREATH  *UNUSUAL BRUISING OR BLEEDING  TENDERNESS IN MOUTH AND THROAT WITH OR WITHOUT PRESENCE OF ULCERS  *URINARY PROBLEMS  *BOWEL PROBLEMS  UNUSUAL RASH Items with * indicate a potential emergency and should be followed up as soon as possible.  Feel free to call the clinic should you have any questions or concerns. The clinic phone number is [phone].  Please show the CHEMO ALERT CARD at check-in to the Emergency Department and triage nurse.   

## 2020-01-28 LAB — TSH: TSH: 1.801 u[IU]/mL (ref 0.308–3.960)

## 2020-02-14 NOTE — Progress Notes (Signed)
[facility] OFFICE PROGRESS NOTE  [name], MD [address] [place] [address]  DIAGNOSIS: Extensive stage (T2a, N3, [name], large right anterior mediastinal and supraclavicular lymphadenopathy as well as pancreatic and splenic metastasis diagnosed in September 2017. The patient had disease progression in October 2019.  PRIOR THERAPY: 1) Systemic chemotherapy was carboplatin for AUC of 5 on day 1 and etoposide 100 MG/M2 on days 1, 2 and 3 with Neulasta support. Status post 6 cycles with significant response of her disease. 2) Prophylactic cranial irradiation under the care of Dr. Sondra Come on 12/02/2016. 3) stereotactic radiotherapy to the recurrent right upper lobe pulmonary nodule under the care of Dr. Sondra Come completed November 10, 2017. 4)Retreatment with systemic chemotherapy with carboplatin for AUC of 5 on day 1 and etoposide 100 mg/M2 on days 1, 2 and 3 as well as Tecentriq (Atezolizumab) 1200 mg IV every 3 weeks with Neulasta support. First dose June 22, 2018 for disease recurrence. Status post 5 cycles. Starting from cycle #2 her dose of carboplatin will be reduced to AUC of 4 and etoposide 80 mg/M2 on days 1, 2 and 3 in addition to the regular dose of Tecentriq  CURRENT THERAPY: Maintenance treatment with single agent Tecentriq 1200 mg IV every 3 weeks. Status post27cycles  INTERVAL HISTORY: Debra Kidd 59 y.o. female returns to the clinic for a follow up visit. The patient is feeling well today without any concerning complaints. She [place].The patient continues to tolerate treatment with immunotherapy with Tecentriqwell without any adverse effects except itching for which she uses atarax. Her PCP send a prescription for 25 mg of atarax but it caused her to feel dizzy. She notes today that her itching has improved compared to prior.  Denies any fever, chills, night sweats, or weight loss. She actually has gained weight since her last treatment. Denies any chest pain or hemoptysisbut endorses her baseline cough and dyspnea on exertion. Denies any nausea, vomiting, diarrhea, or constipation. Denies any headache or visual changes. The patient is here today for evaluation prior to starting cycle #28   MEDICAL HISTORY: Past Medical History:  Diagnosis Date  . 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 Blood type, Rh positive   . Cancer ([facility])   . Chronic pain syndrome   . Depression 08/07/2016  . Encounter for antineoplastic chemotherapy 07/17/2016  . History of external beam radiation therapy 11/19/16-12/02/16   brain 25 Gy in 10 fractions  . Hyperlipidemia   . Hypertension   . Hypertension 08/07/2016  . Osteoporosis   . Small cell lung cancer ([facility]) dx'd 05/2016    ALLERGIES:  is allergic to codeine, motrin [ibuprofen], thiazide-type diuretics, and vicodin [hydrocodone-acetaminophen].  MEDICATIONS:  Current Outpatient Medications  Medication Sig Dispense Refill  . clopidogrel (PLAVIX) 75 MG tablet Take 1 tablet (75 mg total) by mouth daily. 30 tablet 0  . diazepam (VALIUM) 5 MG tablet Take 5 mg by mouth 3 (three) times daily as needed for anxiety.   2  . hydrOXYzine (ATARAX/VISTARIL) 10 MG tablet Take 1 tablet (10 mg total) by mouth 3 (three) times daily as needed. 90 tablet 2  . lidocaine-prilocaine (EMLA) cream Apply 1 application topically as needed. 30 g 0  . oxyCODONE-acetaminophen (PERCOCET) 5-325 MG tablet Take 1 tablet by mouth every 6 (six) hours as needed  for severe pain. 30 tablet 0  . polyethylene glycol (MIRALAX / GLYCOLAX) packet Take 17 g by mouth daily. 14 each 0  . prochlorperazine (COMPAZINE) 10 MG tablet Take 1 tablet (10 mg total) by mouth every 6 (six) hours as needed for nausea or vomiting. 30 tablet 0  . traMADol (ULTRAM) 50 MG tablet Take 50 mg by mouth every 6 (six) hours as needed for  moderate pain.     . Vitamin D, Ergocalciferol, (DRISDOL) 1.25 MG (50000 UT) CAPS capsule Take 1 capsule (50,000 Units total) by mouth every 7 (seven) days. 5 capsule 0  . potassium chloride 20 MEQ/15ML (10%) SOLN Take 15 mLs (20 mEq total) by mouth daily. 100 mL 0   No current facility-administered medications for this visit.    SURGICAL HISTORY:  Past Surgical History:  Procedure Laterality Date  . ABDOMINAL HYSTERECTOMY  1981  . APPENDECTOMY     at age 22  . EYE SURGERY     left  . IR GENERIC HISTORICAL  06/03/2016   IR FLUORO GUIDE PORT INSERTION RIGHT 06/03/2016 [facility]  . IR GENERIC HISTORICAL  06/03/2016   IR US GUIDE VASC ACCESS RIGHT 06/03/2016 [facility]  . SKIN CANCER EXCISION     basal cell carcinoma L side of face    REVIEW OF SYSTEMS:   Review of Systems  Constitutional: Negative for appetite change, chills, fatigue, fever and unexpected weight change.  HENT: Negative for mouth sores, nosebleeds, sore throat and trouble swallowing.  Eyes: Negative for eye problems and icterus.  Respiratory:Positive for baseline cough and shortness of breath with exertion.Negative for hemoptysis and wheezing.  Cardiovascular: Negative for chest pain and leg swelling.  Gastrointestinal: Negative for abdominal pain, constipation, diarrhea, nausea and vomiting.  Genitourinary: Negative for bladder incontinence, difficulty urinating, dysuria, frequency and hematuria.  Musculoskeletal: Negative for back pain, gait problem, neck pain and neck stiffness.  Skin:Positive for itching (improved from prior).Negative for rash.  Neurological: Positive for limping secondary to history of stroke. Negative for dizziness, extremity weakness, headaches, [name], depression and sleep disturbance. The patient is not nervous/anxious.   PHYSICAL  EXAMINATION:  Blood pressure (!) 119/91, pulse 77, temperature 97.7 F (36.5 C), temperature source Temporal, resp. rate 18, height 5\' 3"  (1.6 m), weight 124 lb 3.2 oz (56.3 kg), SpO2 97 %.  ECOG PERFORMANCE STATUS: 1 - Symptomatic but completely ambulatory   Physical Exam  Constitutional: Oriented to person, place, and time andchronically ill appearing femaleand in no distress.  HENT:  Head: Normocephalic and atraumatic.  Mouth/Throat: Oropharynx is clear and moist. No oropharyngeal exudate.  Eyes: Conjunctivae are normal. Right eye exhibits no discharge. Left eye exhibits no discharge. No scleral icterus.  Neck: Normal range of motion. Neck supple.  Cardiovascular: Normal rate, regular rhythm, normal heart sounds and intact distal pulses.  Pulmonary/Chest: Effort normal and breath sounds normal. No respiratory distress. No wheezes. No rales.  Abdominal: Soft. Bowel sounds are normal. Exhibits no distension and no mass. There is no tenderness.  Musculoskeletal: Normal range of motion. Exhibits no edema.  Lymphadenopathy:  No cervical adenopathy.  Neurological: Alert and oriented to person, place, and time. Exhibits normal muscle tone. Positive for limping due to history of stroke.  Skin: Skin is warm and dry. No rash noted. Not diaphoretic. No erythema. No pallor.  Psychiatric: Mood, memory and judgment normal.  Vitals reviewed.  LABORATORY DATA: Lab Results  Component Value Date   WBC 6.9 02/16/2020   HGB 12.6 02/16/2020   HCT 37.1 02/16/2020   MCV 103.3 (H) 02/16/2020   PLT 265 02/16/2020      Chemistry      Component Value Date/Time   NA 142 02/16/2020 0841   NA 141 08/01/2017 0835   K 3.2 (L) 02/16/2020 0841   K 3.5 08/01/2017 0835   CL 106 02/16/2020 0841   CO2 24 02/16/2020 0841   CO2 25 08/01/2017 0835   BUN 6 02/16/2020 0841   BUN 8.0 08/01/2017 0835   CREATININE 0.86 02/16/2020 0841   CREATININE 0.9 08/01/2017 0835      Component Value Date/Time    CALCIUM 9.0 02/16/2020 0841   CALCIUM 9.9 08/01/2017 0835   ALKPHOS 102 02/16/2020 0841   ALKPHOS 142 08/01/2017 0835   AST 14 (L) 02/16/2020 0841   AST 15 08/01/2017 0835   ALT 10 02/16/2020 0841   ALT 19 08/01/2017 0835   BILITOT 0.4 02/16/2020 0841   BILITOT 0.35 08/01/2017 0835       RADIOGRAPHIC STUDIES:  No results found.   ASSESSMENT/PLAN:  This is a very pleasant 59 year old Caucasian female with extensive stage small cell lung cancer. She presented with a right upper lobe lung mass, large right anterior mediastinal and supraclavicular lymphadenopathy as well as a pancreatic andsplenic metastasis. She was diagnosed in September 2017.  The patient underwent systemic chemotherapy with carboplatin and etoposide. She is status post 6 cycles. She tolerated treatment well except for chemotherapy-induced anemia which required pRBCs and platelet transfusions. She had a significant improvement of her disease with chemotherapy.The patientthenunderwent prophylactic cranial irradiation. She then underwent stereotactic radiotherapy to the right upper lobe and pulmonary nodule.  She had been on observation for 2 years before showing evidence of disease progression.   She recently hadbeen started on systemic chemotherapy with carboplatin, etoposide, and Tecentriq. Starting from cycle #5, she has been on maintenance single agent Tecentriq. She has been tolerating treatment well.She is status post27cycles of single agent Tecentriq.  Labs were reviewed. Recommend that she proceed with cycle #28 today as scheduled.   I will order a restaging CT scan of her chest, abdomen, and pelvis prior to her next cycle of treatment.   We will see her back for a follow up visit in 3 weeks for evaluation and to review her scan before starting cycle #29.   She will continue to take atarax for itching if needed.   I have sent a few day course of potassium chloride to her pharmacy for her  hypokalemia.   The patient was advised to call immediately if she has any concerning symptoms in the interval. The patient voices understanding of current disease status and treatment options and is in agreement with the current care plan. All questions were answered. The patient knows to call the clinic with any problems, questions or concerns. We can certainly see the patient much sooner if necessary   Orders Placed This Encounter  Procedures  . CT Chest W Contrast    Standing Status:   Future    Standing Expiration Date:   02/15/2021    Order Specific Question:   ** REASON FOR EXAM (FREE TEXT)    Answer:   Restaging Lung Cancer (small cell)    Order Specific Question:   If indicated for the ordered procedure, I authorize the administration of contrast media per Radiology protocol    Answer:   Yes    Order  Specific Question:   Is patient pregnant?    Answer:   No    Order Specific Question:   Preferred imaging location?    Answer:   [facility]    Order Specific Question:   Radiology Contrast Protocol - do NOT remove file path    Answer:   \\[email or web address]  . CT Abdomen Pelvis W Contrast    Standing Status:   Future    Standing Expiration Date:   02/15/2021    Order Specific Question:   ** REASON FOR EXAM (FREE TEXT)    Answer:   Restaging Small Cell lung cancer    Order Specific Question:   If indicated for the ordered procedure, I authorize the administration of contrast media per Radiology protocol    Answer:   Yes    Order Specific Question:   Is patient pregnant?    Answer:   No    Order Specific Question:   Preferred imaging location?    Answer:   [facility]    Order Specific Question:   Is Oral Contrast requested for this exam?    Answer:   Yes, Per Radiology protocol    Order Specific Question:   Radiology Contrast Protocol - do NOT remove file path    Answer:   \\[email or web address]     [place], PA-C 02/16/20

## 2020-02-16 ENCOUNTER — Inpatient Hospital Stay: Payer: Medicare Other

## 2020-02-16 ENCOUNTER — Inpatient Hospital Stay: Payer: Medicare Other | Attending: Internal Medicine | Admitting: Physician Assistant

## 2020-02-16 ENCOUNTER — Other Ambulatory Visit: Payer: Self-pay

## 2020-02-16 ENCOUNTER — Encounter: Payer: Self-pay | Admitting: Physician Assistant

## 2020-02-16 VITALS — BP 119/91 | HR 77 | Temp 97.7°F | Resp 18 | Ht 63.0 in | Wt 124.2 lb

## 2020-02-16 DIAGNOSIS — R5383 Other fatigue: Secondary | ICD-10-CM

## 2020-02-16 DIAGNOSIS — E876 Hypokalemia: Secondary | ICD-10-CM | POA: Diagnosis not present

## 2020-02-16 DIAGNOSIS — Z85828 Personal history of other malignant neoplasm of skin: Secondary | ICD-10-CM | POA: Diagnosis not present

## 2020-02-16 DIAGNOSIS — I10 Essential (primary) hypertension: Secondary | ICD-10-CM | POA: Insufficient documentation

## 2020-02-16 DIAGNOSIS — D6481 Anemia due to antineoplastic chemotherapy: Secondary | ICD-10-CM | POA: Diagnosis not present

## 2020-02-16 DIAGNOSIS — C3491 Malignant neoplasm of unspecified part of right bronchus or lung: Secondary | ICD-10-CM

## 2020-02-16 DIAGNOSIS — Z5112 Encounter for antineoplastic immunotherapy: Secondary | ICD-10-CM | POA: Diagnosis not present

## 2020-02-16 DIAGNOSIS — C3411 Malignant neoplasm of upper lobe, right bronchus or lung: Secondary | ICD-10-CM | POA: Insufficient documentation

## 2020-02-16 DIAGNOSIS — E785 Hyperlipidemia, unspecified: Secondary | ICD-10-CM | POA: Insufficient documentation

## 2020-02-16 DIAGNOSIS — G894 Chronic pain syndrome: Secondary | ICD-10-CM | POA: Diagnosis not present

## 2020-02-16 LAB — CBC WITH DIFFERENTIAL (CANCER CENTER ONLY)
Abs Immature Granulocytes: 0.02 10*3/uL (ref 0.00–0.07)
Basophils Absolute: 0 10*3/uL (ref 0.0–0.1)
Basophils Relative: 0 %
Eosinophils Absolute: 0.3 10*3/uL (ref 0.0–0.5)
Eosinophils Relative: 4 %
HCT: 37.1 % (ref 36.0–46.0)
Hemoglobin: 12.6 g/dL (ref 12.0–15.0)
Immature Granulocytes: 0 %
Lymphocytes Relative: 29 %
Lymphs Abs: 2 10*3/uL (ref 0.7–4.0)
MCH: 35.1 pg — ABNORMAL HIGH (ref 26.0–34.0)
MCHC: 34 g/dL (ref 30.0–36.0)
MCV: 103.3 fL — ABNORMAL HIGH (ref 80.0–100.0)
Monocytes Absolute: 0.4 10*3/uL (ref 0.1–1.0)
Monocytes Relative: 6 %
Neutro Abs: 4.1 10*3/uL (ref 1.7–7.7)
Neutrophils Relative %: 61 %
Platelet Count: 265 10*3/uL (ref 150–400)
RBC: 3.59 MIL/uL — ABNORMAL LOW (ref 3.87–5.11)
RDW: 12.3 % (ref 11.5–15.5)
WBC Count: 6.9 10*3/uL (ref 4.0–10.5)
nRBC: 0 % (ref 0.0–0.2)

## 2020-02-16 LAB — CMP (CANCER CENTER ONLY)
ALT: 10 U/L (ref 0–44)
AST: 14 U/L — ABNORMAL LOW (ref 15–41)
Albumin: 3.5 g/dL (ref 3.5–5.0)
Alkaline Phosphatase: 102 U/L (ref 38–126)
Anion gap: 12 (ref 5–15)
BUN: 6 mg/dL (ref 6–20)
CO2: 24 mmol/L (ref 22–32)
Calcium: 9 mg/dL (ref 8.9–10.3)
Chloride: 106 mmol/L (ref 98–111)
Creatinine: 0.86 mg/dL (ref 0.44–1.00)
GFR, Est AFR Am: >60 mL/min (ref >60)
GFR, Estimated: >60 mL/min (ref >60)
Glucose, Bld: 97 mg/dL (ref 70–99)
Potassium: 3.2 mmol/L — ABNORMAL LOW (ref 3.5–5.1)
Sodium: 142 mmol/L (ref 135–145)
Total Bilirubin: 0.4 mg/dL (ref 0.3–1.2)
Total Protein: 6.9 g/dL (ref 6.5–8.1)

## 2020-02-16 LAB — TSH: TSH: 3.492 u[IU]/mL (ref 0.308–3.960)

## 2020-02-16 MED ORDER — SODIUM CHLORIDE 0.9% FLUSH
10.0000 mL | INTRAVENOUS | Status: DC | PRN
  Administered 2020-02-16: 10 mL
  Filled 2020-02-16: qty 10

## 2020-02-16 MED ORDER — SODIUM CHLORIDE 0.9 % IV SOLN
Freq: Once | INTRAVENOUS | Status: AC
  Filled 2020-02-16: qty 250

## 2020-02-16 MED ORDER — POTASSIUM CHLORIDE 20 MEQ/15ML (10%) PO SOLN: 20.0000 meq | Freq: Every day | ORAL | 0 refills | Status: DC

## 2020-02-16 MED ORDER — SODIUM CHLORIDE 0.9 % IV SOLN
1200.0000 mg | Freq: Once | INTRAVENOUS | Status: AC
  Administered 2020-02-16: 1200 mg via INTRAVENOUS
  Filled 2020-02-16: qty 20

## 2020-02-16 MED ORDER — HEPARIN SOD (PORK) LOCK FLUSH 100 UNIT/ML IV SOLN
500.0000 [IU] | Freq: Once | INTRAVENOUS | Status: AC | PRN
  Administered 2020-02-16: 500 [IU]
  Filled 2020-02-16: qty 5

## 2020-02-16 MED FILL — POTASSIUM CHLORIDE 20 MEQ/1: 20 MEQ/15ML | 6 days supply | Qty: 100 | Fill #0

## 2020-02-16 NOTE — Patient Instructions (Signed)
Debra Kidd Discharge Instructions for Patients Receiving Chemotherapy  Today you received the following chemotherapy agents: Atezolizumab   To help prevent nausea and vomiting after your treatment, we encourage you to take your nausea medication as prescribed.    If you develop nausea and vomiting that is not controlled by your nausea medication, call the clinic.   BELOW ARE SYMPTOMS THAT SHOULD BE REPORTED IMMEDIATELY:  *FEVER GREATER THAN 100.5 F  *CHILLS WITH OR WITHOUT FEVER  NAUSEA AND VOMITING THAT IS NOT CONTROLLED WITH YOUR NAUSEA MEDICATION  *UNUSUAL SHORTNESS OF BREATH  *UNUSUAL BRUISING OR BLEEDING  TENDERNESS IN MOUTH AND THROAT WITH OR WITHOUT PRESENCE OF ULCERS  *URINARY PROBLEMS  *BOWEL PROBLEMS  UNUSUAL RASH Items with * indicate a potential emergency and should be followed up as soon as possible.  Feel free to call the clinic should you have any questions or concerns. The clinic phone number is [phone].  Please show the [facility] at check-in to the Emergency Department and triage nurse.

## 2020-03-07 ENCOUNTER — Encounter (HOSPITAL_COMMUNITY): Payer: Self-pay

## 2020-03-07 ENCOUNTER — Ambulatory Visit (HOSPITAL_COMMUNITY)
Admission: RE | Admit: 2020-03-07 | Discharge: 2020-03-07 | Disposition: A | Discharge status: Home / Self Care | Payer: Medicare Other | Source: Ambulatory Visit | Attending: Physician Assistant | Admitting: Physician Assistant

## 2020-03-07 ENCOUNTER — Other Ambulatory Visit: Payer: Self-pay

## 2020-03-07 DIAGNOSIS — C3491 Malignant neoplasm of unspecified part of right bronchus or lung: Secondary | ICD-10-CM | POA: Insufficient documentation

## 2020-03-07 MED ORDER — SODIUM CHLORIDE (PF) 0.9 % IJ SOLN
INTRAMUSCULAR | Status: AC
  Filled 2020-03-07: qty 50

## 2020-03-07 MED ORDER — HEPARIN SOD (PORK) LOCK FLUSH 100 UNIT/ML IV SOLN
500.0000 [IU] | Freq: Once | INTRAVENOUS | Status: AC
  Administered 2020-03-07: 500 [IU] via INTRAVENOUS

## 2020-03-07 MED ORDER — HEPARIN SOD (PORK) LOCK FLUSH 100 UNIT/ML IV SOLN
INTRAVENOUS | Status: AC
  Filled 2020-03-07: qty 5

## 2020-03-07 MED ORDER — IOHEXOL 300 MG/ML  SOLN
100.0000 mL | Freq: Once | INTRAMUSCULAR | Status: AC | PRN
  Administered 2020-03-07: 100 mL via INTRAVENOUS

## 2020-03-09 ENCOUNTER — Inpatient Hospital Stay: Payer: Medicare Other

## 2020-03-09 ENCOUNTER — Encounter: Payer: Self-pay | Admitting: Internal Medicine

## 2020-03-09 ENCOUNTER — Other Ambulatory Visit: Payer: Self-pay

## 2020-03-09 ENCOUNTER — Other Ambulatory Visit: Payer: Self-pay | Admitting: Internal Medicine

## 2020-03-09 ENCOUNTER — Inpatient Hospital Stay: Payer: Medicare Other | Attending: Internal Medicine | Admitting: Internal Medicine

## 2020-03-09 VITALS — BP 111/81 | HR 76 | Temp 97.5°F | Resp 18 | Ht 63.0 in | Wt 127.4 lb

## 2020-03-09 DIAGNOSIS — Z85828 Personal history of other malignant neoplasm of skin: Secondary | ICD-10-CM | POA: Diagnosis not present

## 2020-03-09 DIAGNOSIS — M810 Age-related osteoporosis without current pathological fracture: Secondary | ICD-10-CM | POA: Diagnosis not present

## 2020-03-09 DIAGNOSIS — Z5112 Encounter for antineoplastic immunotherapy: Secondary | ICD-10-CM | POA: Insufficient documentation

## 2020-03-09 DIAGNOSIS — L299 Pruritus, unspecified: Secondary | ICD-10-CM

## 2020-03-09 DIAGNOSIS — F329 Major depressive disorder, single episode, unspecified: Secondary | ICD-10-CM | POA: Diagnosis not present

## 2020-03-09 DIAGNOSIS — I700 Atherosclerosis of aorta: Secondary | ICD-10-CM | POA: Diagnosis not present

## 2020-03-09 DIAGNOSIS — C3411 Malignant neoplasm of upper lobe, right bronchus or lung: Secondary | ICD-10-CM | POA: Diagnosis not present

## 2020-03-09 DIAGNOSIS — C7889 Secondary malignant neoplasm of other digestive organs: Secondary | ICD-10-CM | POA: Insufficient documentation

## 2020-03-09 DIAGNOSIS — C3491 Malignant neoplasm of unspecified part of right bronchus or lung: Secondary | ICD-10-CM

## 2020-03-09 DIAGNOSIS — Z923 Personal history of irradiation: Secondary | ICD-10-CM | POA: Diagnosis not present

## 2020-03-09 DIAGNOSIS — E785 Hyperlipidemia, unspecified: Secondary | ICD-10-CM | POA: Diagnosis not present

## 2020-03-09 DIAGNOSIS — Z9221 Personal history of antineoplastic chemotherapy: Secondary | ICD-10-CM | POA: Insufficient documentation

## 2020-03-09 DIAGNOSIS — G894 Chronic pain syndrome: Secondary | ICD-10-CM | POA: Diagnosis not present

## 2020-03-09 DIAGNOSIS — Z79899 Other long term (current) drug therapy: Secondary | ICD-10-CM | POA: Insufficient documentation

## 2020-03-09 DIAGNOSIS — I10 Essential (primary) hypertension: Secondary | ICD-10-CM | POA: Insufficient documentation

## 2020-03-09 DIAGNOSIS — R5383 Other fatigue: Secondary | ICD-10-CM

## 2020-03-09 LAB — CBC WITH DIFFERENTIAL (CANCER CENTER ONLY)
Abs Immature Granulocytes: 0.01 10*3/uL (ref 0.00–0.07)
Basophils Absolute: 0 10*3/uL (ref 0.0–0.1)
Basophils Relative: 0 %
Eosinophils Absolute: 0.2 10*3/uL (ref 0.0–0.5)
Eosinophils Relative: 2 %
HCT: 39.4 % (ref 36.0–46.0)
Hemoglobin: 13.2 g/dL (ref 12.0–15.0)
Immature Granulocytes: 0 %
Lymphocytes Relative: 25 %
Lymphs Abs: 1.6 10*3/uL (ref 0.7–4.0)
MCH: 34.7 pg — ABNORMAL HIGH (ref 26.0–34.0)
MCHC: 33.5 g/dL (ref 30.0–36.0)
MCV: 103.7 fL — ABNORMAL HIGH (ref 80.0–100.0)
Monocytes Absolute: 0.5 10*3/uL (ref 0.1–1.0)
Monocytes Relative: 8 %
Neutro Abs: 4.3 10*3/uL (ref 1.7–7.7)
Neutrophils Relative %: 65 %
Platelet Count: 276 10*3/uL (ref 150–400)
RBC: 3.8 MIL/uL — ABNORMAL LOW (ref 3.87–5.11)
RDW: 13.4 % (ref 11.5–15.5)
WBC Count: 6.6 10*3/uL (ref 4.0–10.5)
nRBC: 0 % (ref 0.0–0.2)

## 2020-03-09 LAB — CMP (CANCER CENTER ONLY)
ALT: 15 U/L (ref 0–44)
AST: 17 U/L (ref 15–41)
Albumin: 3.4 g/dL — ABNORMAL LOW (ref 3.5–5.0)
Alkaline Phosphatase: 134 U/L — ABNORMAL HIGH (ref 38–126)
Anion gap: 11 (ref 5–15)
BUN: 6 mg/dL — ABNORMAL LOW (ref 8–23)
CO2: 25 mmol/L (ref 22–32)
Calcium: 8.9 mg/dL (ref 8.9–10.3)
Chloride: 104 mmol/L (ref 98–111)
Creatinine: 0.78 mg/dL (ref 0.44–1.00)
GFR, Est AFR Am: >60 mL/min (ref >60)
GFR, Estimated: >60 mL/min (ref >60)
Glucose, Bld: 94 mg/dL (ref 70–99)
Potassium: 3.6 mmol/L (ref 3.5–5.1)
Sodium: 140 mmol/L (ref 135–145)
Total Bilirubin: 0.3 mg/dL (ref 0.3–1.2)
Total Protein: 6.8 g/dL (ref 6.5–8.1)

## 2020-03-09 LAB — TSH: TSH: 1.667 u[IU]/mL (ref 0.308–3.960)

## 2020-03-09 MED ORDER — SODIUM CHLORIDE 0.9 % IV SOLN
1200.0000 mg | Freq: Once | INTRAVENOUS | Status: AC
  Administered 2020-03-09: 1200 mg via INTRAVENOUS
  Filled 2020-03-09: qty 20

## 2020-03-09 MED ORDER — HYDROXYZINE HCL 10 MG PO TABS: 10.0000 mg | ORAL_TABLET | Freq: Three times a day (TID) | ORAL | 1 refills | Status: DC | PRN

## 2020-03-09 MED ORDER — SODIUM CHLORIDE 0.9 % IV SOLN
Freq: Once | INTRAVENOUS | Status: AC
  Filled 2020-03-09: qty 250

## 2020-03-09 MED ORDER — SODIUM CHLORIDE 0.9% FLUSH
10.0000 mL | INTRAVENOUS | Status: DC | PRN
  Administered 2020-03-09: 10 mL
  Filled 2020-03-09: qty 10

## 2020-03-09 MED ORDER — HEPARIN SOD (PORK) LOCK FLUSH 100 UNIT/ML IV SOLN
500.0000 [IU] | Freq: Once | INTRAVENOUS | Status: AC | PRN
  Administered 2020-03-09: 500 [IU]
  Filled 2020-03-09: qty 5

## 2020-03-09 NOTE — Progress Notes (Signed)
[facility] [phone]   [phone]  OFFICE PROGRESS NOTE  [name], MD [address] [address]  DIAGNOSIS: Extensive stage (T2a, N3, [name], large right anterior mediastinal and supraclavicular lymphadenopathy as well as pancreatic and splenic metastasis diagnosed in September 2017.  The patient had disease progression in October 2019.  PRIOR THERAPY:  1) Systemic chemotherapy was carboplatin for AUC of 5 on day 1 and etoposide 100 MG/M2 on days 1, 2 and 3 with Neulasta support. Status post 6 cycles with significant response of her disease. 2) Prophylactic cranial irradiation under the care of Dr. Sondra Come on 12/02/2016. 3) stereotactic radiotherapy to the recurrent right upper lobe pulmonary nodule under the care of Dr. Sondra Come completed November 10, 2017. 4) Retreatment with systemic chemotherapy with carboplatin for AUC of 5 on day 1 and etoposide 100 mg/M2 on days 1, 2 and 3 as well as Tecentriq (Atezolizumab) 1200 mg IV every 3 weeks with Neulasta support.  First dose June 22, 2018 for disease recurrence.  Status post 5 cycles.  Starting from cycle #2 her dose of carboplatin will be reduced to AUC of 4 and etoposide 80 mg/M2 on days 1, 2 and 3 in addition to the regular dose of Tecentriq.  CURRENT THERAPY: Maintenance treatment with single agent Tecentriq 1200 mg IV every 3 weeks.  Status post 23 cycles.  INTERVAL HISTORY: Debra Kidd 59 y.o. female returns to the clinic today for follow-up visit.  The patient is feeling fine today with no concerning complaints.  She has a fall at home when she slipped from her bed and hit the floor.  She had 2 small bumps in the back of her head but these are improving.  She denied having any current chest pain, shortness of breath, cough or hemoptysis.  She denied having any fever or chills.  She has no nausea, vomiting, diarrhea or  constipation.  She has no headache or visual changes.  She denied having any recent weight loss or night sweats.  She continues to tolerate her treatment with Tecentriq fairly well except for the persistent itching and she is requesting refill of Atarax.  The patient has repeat CT scan of the chest, abdomen pelvis performed recently and she is here for evaluation and discussion of her scan results.   MEDICAL HISTORY: Past Medical History:  Diagnosis Date  . 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 Blood type, Rh positive   . Cancer ([facility])   . Chronic pain syndrome   . Depression 08/07/2016  . Encounter for antineoplastic chemotherapy 07/17/2016  . History of external beam radiation therapy 11/19/16-12/02/16   brain 25 Gy in 10 fractions  . Hyperlipidemia   . Hypertension   . Hypertension 08/07/2016  . Osteoporosis   . Small cell lung cancer ([facility]) dx'd 05/2016    ALLERGIES:  is allergic to codeine, motrin [ibuprofen], thiazide-type diuretics, and vicodin [hydrocodone-acetaminophen].  MEDICATIONS:  Current Outpatient Medications  Medication Sig Dispense Refill  . clopidogrel (PLAVIX) 75 MG tablet Take 1 tablet (75 mg total) by mouth daily. 30 tablet 0  . diazepam (VALIUM) 5 MG tablet Take 5 mg by mouth 3 (three) times daily as needed for anxiety.   2  . lidocaine-prilocaine (EMLA) cream Apply 1 application topically as needed. 30 g 0  . oxyCODONE-acetaminophen (PERCOCET) 5-325 MG tablet Take  1 tablet by mouth every 6 (six) hours as needed for severe pain. 30 tablet 0  . polyethylene glycol (MIRALAX / GLYCOLAX) packet Take 17 g by mouth daily. 14 each 0  . potassium chloride 20 MEQ/15ML (10%) SOLN Take 15 mLs (20 mEq total) by mouth daily. 100 mL 0  . traMADol (ULTRAM) 50 MG tablet Take 50 mg by mouth every 6 (six) hours as needed for moderate pain.     . Vitamin D, Ergocalciferol, (DRISDOL) 1.25 MG (50000 UT) CAPS capsule Take 1 capsule (50,000 Units total) by mouth every 7 (seven)  days. 5 capsule 0  . hydrOXYzine (ATARAX/VISTARIL) 10 MG tablet Take 1 tablet (10 mg total) by mouth 3 (three) times daily as needed. (Patient not taking: Reported on 03/09/2020) 90 tablet 2  . prochlorperazine (COMPAZINE) 10 MG tablet Take 1 tablet (10 mg total) by mouth every 6 (six) hours as needed for nausea or vomiting. (Patient not taking: Reported on 03/09/2020) 30 tablet 0   No current facility-administered medications for this visit.    SURGICAL HISTORY:  Past Surgical History:  Procedure Laterality Date  . ABDOMINAL HYSTERECTOMY  1981  . APPENDECTOMY     at age 52  . EYE SURGERY     left  . IR GENERIC HISTORICAL  06/03/2016   IR FLUORO GUIDE PORT INSERTION RIGHT 06/03/2016 [facility]  . IR GENERIC HISTORICAL  06/03/2016   IR US GUIDE VASC ACCESS RIGHT 06/03/2016 [facility]  . SKIN CANCER EXCISION     basal cell carcinoma L side of face    REVIEW OF SYSTEMS:  Constitutional: positive for fatigue Eyes: negative Ears, nose, mouth, throat, and face: negative Respiratory: negative Cardiovascular: negative Gastrointestinal: negative Genitourinary:negative Integument/breast: positive for pruritus Hematologic/lymphatic: negative Musculoskeletal:negative Neurological: negative Behavioral/Psych: negative Endocrine: negative Allergic/Immunologic: negative   PHYSICAL EXAMINATION: General appearance: alert, cooperative and no distress Head: Normocephalic, without obvious abnormality, atraumatic Neck: no adenopathy, no JVD, supple, symmetrical, trachea midline and thyroid not enlarged, symmetric, no tenderness/mass/nodules Lymph nodes: Cervical, supraclavicular, and axillary nodes normal. Resp: clear to auscultation bilaterally Back: symmetric, no curvature. ROM normal. No CVA tenderness. Cardio: regular rate and rhythm, S1, S2 normal, no murmur, click, rub or gallop GI: soft, non-tender; bowel sounds normal; no masses,  no organomegaly Extremities: extremities normal,  atraumatic, no cyanosis or edema Neurologic: Alert and oriented X 3, normal strength and tone. Normal symmetric reflexes. Normal coordination and gait  ECOG PERFORMANCE STATUS: 1 - Symptomatic but completely ambulatory  Blood pressure 111/81, pulse 76, temperature (!) 97.5 F (36.4 C), temperature source Temporal, resp. rate 18, height 5\' 3"  (1.6 m), weight 127 lb 6.4 oz (57.8 kg), SpO2 100 %.  LABORATORY DATA: Lab Results  Component Value Date   WBC 6.6 03/09/2020   HGB 13.2 03/09/2020   HCT 39.4 03/09/2020   MCV 103.7 (H) 03/09/2020   PLT 276 03/09/2020      Chemistry      Component Value Date/Time   NA 142 02/16/2020 0841   NA 141 08/01/2017 0835   K 3.2 (L) 02/16/2020 0841   K 3.5 08/01/2017 0835   CL 106 02/16/2020 0841   CO2 24 02/16/2020 0841   CO2 25 08/01/2017 0835   BUN 6 02/16/2020 0841   BUN 8.0 08/01/2017 0835   CREATININE 0.86 02/16/2020 0841   CREATININE 0.9 08/01/2017 0835      Component Value Date/Time   CALCIUM 9.0 02/16/2020 0841   CALCIUM 9.9 08/01/2017 0835   ALKPHOS 102 02/16/2020  0841   ALKPHOS 142 08/01/2017 0835   AST 14 (L) 02/16/2020 0841   AST 15 08/01/2017 0835   ALT 10 02/16/2020 0841   ALT 19 08/01/2017 0835   BILITOT 0.4 02/16/2020 0841   BILITOT 0.35 08/01/2017 0835       RADIOGRAPHIC STUDIES: CT Chest W Contrast  Result Date: 03/07/2020 CLINICAL DATA:  Restaging small cell lung cancer. Immunotherapy in progress. EXAM: CT CHEST, ABDOMEN, AND PELVIS WITH CONTRAST TECHNIQUE: Multidetector CT imaging of the chest, abdomen and pelvis was performed following the standard protocol during bolus administration of intravenous contrast. CONTRAST:  153mL OMNIPAQUE IOHEXOL 300 MG/ML  SOLN COMPARISON:  Multiple exams, including 01/04/2020 0.9 cm right hilar lymph node on image 52/4. FINDINGS: CT CHEST FINDINGS Cardiovascular: Right Port-A-Cath tip: Lower SVC. Coronary, aortic arch, and branch vessel atherosclerotic vascular disease. High-grade  stenosis of the proximal right multifocal high-grade stenosis of the right subclavian artery. Moderate stenosis of the left subclavian artery due to atheromatous plaque on image 70/4. Severe stenosis with near occlusion of the left common carotid artery particularly near the origin. Mediastinum/Nodes: Right hilar lymph node 0.9 cm in short axis on image 52/4, stable by my measurements. Lungs/Pleura: Stable right apical pleural based density peripherally probably from scarring and treatment related findings, no significant change in contour to favor active malignancy. Similar bandlike density in the posterior basal segment right lower lobe likely from scarring. Stable lingular and left lower lobe scarring peripherally. Musculoskeletal: Old healed left lower posterior rib fractures. Stable compression fractures at T7, T8, and T9. New anterior wedging at the C6 vertebral body level for example on image 74/5. CT ABDOMEN PELVIS FINDINGS Hepatobiliary: Mildly contracted gallbladder. Otherwise unremarkable. Pancreas: Unremarkable Spleen: Unremarkable Adrenals/Urinary Tract: Unremarkable Stomach/Bowel: Unremarkable Vascular/Lymphatic: Aortoiliac atherosclerotic vascular disease. High-grade stenosis of the superior mesenteric artery proximally for example on image 58/2. No pathologic adenopathy observed. Reproductive: Uterus absent.  Adnexa unremarkable. 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 Musculoskeletal: Unchanged L2 superior endplate compression fracture. IMPRESSION: 1. Stable right apical pleural based density peripherally probably from scarring and treatment related findings, no significant change in contour to favor active malignancy. 2. 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 Moderate stenosis in the left subclavian artery. 3. Stable compression  fractures at T7, T8, T9, and L2. New anterior wedging at the C6 vertebral body level compared to 01/04/2019 1 may represent a new lower cervical wedge compression fracture. 4. Other imaging findings of potential clinical significance: Coronary, aortic arch, and branch vessel atherosclerotic vascular disease. 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 5. Aortic atherosclerosis. Aortic Atherosclerosis (ICD10-I70.0). Electronically Signed   By: Van Clines M.D.   On: 03/07/2020 09:16   CT Abdomen Pelvis W Contrast  Result Date: 03/07/2020 CLINICAL DATA:  Restaging small cell lung cancer. Immunotherapy in progress. EXAM: CT CHEST, ABDOMEN, AND PELVIS WITH CONTRAST TECHNIQUE: Multidetector CT imaging of the chest, abdomen and pelvis was performed following the standard protocol during bolus administration of intravenous contrast. CONTRAST:  19mL OMNIPAQUE IOHEXOL 300 MG/ML  SOLN COMPARISON:  Multiple exams, including 01/04/2020 0.9 cm right hilar lymph node on image 52/4. FINDINGS: CT CHEST FINDINGS Cardiovascular: Right Port-A-Cath tip: Lower SVC. Coronary, aortic arch, and branch vessel atherosclerotic vascular disease. High-grade stenosis of the proximal right multifocal high-grade stenosis of the right subclavian artery. Moderate stenosis of the left subclavian artery due to atheromatous plaque on  image 70/4. Severe stenosis with near occlusion of the left common carotid artery particularly near the origin. Mediastinum/Nodes: Right hilar lymph node 0.9 cm in short axis on image 52/4, stable by my measurements. Lungs/Pleura: Stable right apical pleural based density peripherally probably from scarring and treatment related findings, no significant change in contour to favor active malignancy. Similar bandlike density in the posterior basal segment right lower lobe likely from scarring. Stable lingular and left lower lobe scarring peripherally. Musculoskeletal: Old healed  left lower posterior rib fractures. Stable compression fractures at T7, T8, and T9. New anterior wedging at the C6 vertebral body level for example on image 74/5. CT ABDOMEN PELVIS FINDINGS Hepatobiliary: Mildly contracted gallbladder. Otherwise unremarkable. Pancreas: Unremarkable Spleen: Unremarkable Adrenals/Urinary Tract: Unremarkable Stomach/Bowel: Unremarkable Vascular/Lymphatic: Aortoiliac atherosclerotic vascular disease. High-grade stenosis of the superior mesenteric artery proximally for example on image 58/2. No pathologic adenopathy observed. Reproductive: Uterus absent.  Adnexa unremarkable. 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 Musculoskeletal: Unchanged L2 superior endplate compression fracture. IMPRESSION: 1. Stable right apical pleural based density peripherally probably from scarring and treatment related findings, no significant change in contour to favor active malignancy. 2. 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 Moderate stenosis in the left subclavian artery. 3. Stable compression fractures at T7, T8, T9, and L2. New anterior wedging at the C6 vertebral body level compared to 01/04/2019 1 may represent a new lower cervical wedge compression fracture. 4. Other imaging findings of potential clinical significance: Coronary, aortic arch, and branch vessel atherosclerotic vascular disease. 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 5. Aortic atherosclerosis. Aortic Atherosclerosis (ICD10-I70.0). Electronically Signed   By: Van Clines M.D.   On: 03/07/2020 09:16    ASSESSMENT AND PLAN:  This is a very pleasant 59 years old white female with extensive stage small cell lung cancer status post systemic chemotherapy with carbo platinum and etoposide for 6 cycles and the patient rotated her  treatment well except for chemotherapy-induced anemia and requirement for PRBCs and platelet transfusion. She had significant improvement in her disease with the chemotherapy. She also had prophylactic cranial irradiation. She also underwent stereotactic radiotherapy to progressive right upper lobe pulmonary nodule. The patient has been in observation for close to 2 years. Repeat CT scan of the chest, abdomen and pelvis performed recently showed evidence for disease progression in the abdomen. The patient was started on systemic chemotherapy again with carboplatin, etoposide and Tecentriq status post 28 cycles.  Starting from cycle #5 the patient is treated with maintenance single agent Tecentriq. The patient continues to tolerate her treatment well except for the itching. She had repeat CT scan of the chest, abdomen pelvis performed recently.  I personally and independently reviewed the scans and discussed the results with the patient today. Her scan showed no concerning findings for disease progression. I recommended for the patient to continue her current treatment with Tecentriq and she will proceed with cycle #29 today. For the persistent pruritus, I will give her a refill of Atarax 10 mg p.o. 3 times daily as needed. The patient will come back for follow-up visit in 3 weeks for evaluation before the next cycle of her treatment. She was advised to call immediately if she has any concerning symptoms in the interval.  The patient voices understanding of current disease status and treatment options and is in agreement with the current care plan. All questions were  answered. The patient knows to call the clinic with any problems, questions or concerns. We can certainly see the patient much sooner if necessary.  Disclaimer: This note was dictated with voice recognition software. Similar sounding words can inadvertently be transcribed and may not be corrected upon review.

## 2020-03-09 NOTE — Patient Instructions (Signed)
[facility] Discharge Instructions for Patients Receiving Chemotherapy  Today you received the following chemotherapy agents: tecentriq  To help prevent nausea and vomiting after your treatment, we encourage you to take your nausea medication as directed.    If you develop nausea and vomiting that is not controlled by your nausea medication, call the clinic.   BELOW ARE SYMPTOMS THAT SHOULD BE REPORTED IMMEDIATELY:  *FEVER GREATER THAN 100.5 F  *CHILLS WITH OR WITHOUT FEVER  NAUSEA AND VOMITING THAT IS NOT CONTROLLED WITH YOUR NAUSEA MEDICATION  *UNUSUAL SHORTNESS OF BREATH  *UNUSUAL BRUISING OR BLEEDING  TENDERNESS IN MOUTH AND THROAT WITH OR WITHOUT PRESENCE OF ULCERS  *URINARY PROBLEMS  *BOWEL PROBLEMS  UNUSUAL RASH Items with * indicate a potential emergency and should be followed up as soon as possible.  Feel free to call the clinic should you have any questions or concerns. The clinic phone number is [phone].  Please show the [facility] at check-in to the Emergency Department and triage nurse.

## 2020-03-09 NOTE — Patient Instructions (Signed)
Steps to Quit Smoking Smoking tobacco is the leading cause of preventable death. It can affect almost every organ in the body. Smoking puts you and people around you at risk for many serious, long-lasting (chronic) diseases. Quitting smoking can be hard, but it is one of the best things that you can do for your health. It is never too late to quit. How do I get ready to quit? When you decide to quit smoking, make a plan to help you succeed. Before you quit:  Pick a date to quit. Set a date within the next 2 weeks to give you time to prepare.  Write down the reasons why you are quitting. Keep this list in places where you will see it often.  Tell your family, friends, and co-workers that you are quitting. Their support is important.  Talk with your doctor about the choices that may help you quit.  Find out if your health insurance will pay for these treatments.  Know the people, places, things, and activities that make you want to smoke (triggers). Avoid them. What first steps can I take to quit smoking?  Throw away all cigarettes at home, at work, and in your car.  Throw away the things that you use when you smoke, such as ashtrays and lighters.  Clean your car. Make sure to empty the ashtray.  Clean your home, including curtains and carpets. What can I do to help me quit smoking? Talk with your doctor about taking medicines and seeing a counselor at the same time. You are more likely to succeed when you do both.  If you are pregnant or breastfeeding, talk with your doctor about counseling or other ways to quit smoking. Do not take medicine to help you quit smoking unless your doctor tells you to do so. To quit smoking: Quit right away  Quit smoking totally, instead of slowly cutting back on how much you smoke over a period of time.  Go to counseling. You are more likely to quit if you go to counseling sessions regularly. Take medicine You may take medicines to help you quit. Some  medicines need a prescription, and some you can buy over-the-counter. Some medicines may contain a drug called nicotine to replace the nicotine in cigarettes. Medicines may:  Help you to stop having the desire to smoke (cravings).  Help to stop the problems that come when you stop smoking (withdrawal symptoms). Your doctor may ask you to use:  Nicotine patches, gum, or lozenges.  Nicotine inhalers or sprays.  Non-nicotine medicine that is taken by mouth. Find resources Find resources and other ways to help you quit smoking and remain smoke-free after you quit. These resources are most helpful when you use them often. They include:  Online chats with a counselor.  Phone quitlines.  Printed self-help materials.  Support groups or group counseling.  Text messaging programs.  Mobile phone apps. Use apps on your mobile phone or tablet that can help you stick to your quit plan. There are many free apps for mobile phones and tablets as well as websites. Examples include Quit Guide from the CDC and smokefree.gov  What things can I do to make it easier to quit?   Talk to your family and friends. Ask them to support and encourage you.  Call a phone quitline (1-800-QUIT-NOW), reach out to support groups, or work with a counselor.  Ask people who smoke to not smoke around you.  Avoid places that make you want to smoke,   such as: ? Bars. ? Parties. ? Smoke-break areas at work.  Spend time with people who do not smoke.  Lower the stress in your life. Stress can make you want to smoke. Try these things to help your stress: ? Getting regular exercise. ? Doing deep-breathing exercises. ? Doing yoga. ? Meditating. ? Doing a body scan. To do this, close your eyes, focus on one area of your body at a time from head to toe. Notice which parts of your body are tense. Try to relax the muscles in those areas. How will I feel when I quit smoking? Day 1 to 3 weeks Within the first 24 hours,  you may start to have some problems that come from quitting tobacco. These problems are very bad 2-3 days after you quit, but they do not often last for more than 2-3 weeks. You may get these symptoms:  Mood swings.  Feeling restless, nervous, angry, or annoyed.  Trouble concentrating.  Dizziness.  Strong desire for high-sugar foods and nicotine.  Weight gain.  Trouble pooping (constipation).  Feeling like you may vomit (nausea).  Coughing or a sore throat.  Changes in how the medicines that you take for other issues work in your body.  Depression.  Trouble sleeping (insomnia). Week 3 and afterward After the first 2-3 weeks of quitting, you may start to notice more positive results, such as:  Better sense of smell and taste.  Less coughing and sore throat.  Slower heart rate.  Lower blood pressure.  Clearer skin.  Better breathing.  Fewer sick days. Quitting smoking can be hard. Do not give up if you fail the first time. Some people need to try a few times before they succeed. Do your best to stick to your quit plan, and talk with your doctor if you have any questions or concerns. Summary  Smoking tobacco is the leading cause of preventable death. Quitting smoking can be hard, but it is one of the best things that you can do for your health.  When you decide to quit smoking, make a plan to help you succeed.  Quit smoking right away, not slowly over a period of time.  When you start quitting, seek help from your doctor, family, or friends. This information is not intended to replace advice given to you by your health care provider. Make sure you discuss any questions you have with your health care provider. Document Revised: 05/14/2019 Document Reviewed: 11/07/2018 Elsevier Patient Education  2020 [employer]  

## 2020-03-10 ENCOUNTER — Telehealth: Payer: Self-pay | Admitting: Medical Oncology

## 2020-03-10 NOTE — Telephone Encounter (Signed)
Pt awaiting appt.I told her it is expected July 29th.

## 2020-03-20 ENCOUNTER — Telehealth: Payer: Self-pay | Admitting: Internal Medicine

## 2020-03-20 NOTE — Telephone Encounter (Signed)
Called patient to inform of appts. No answer, not able to leave msg. Mailed printout

## 2020-03-27 NOTE — Progress Notes (Signed)
[facility] OFFICE PROGRESS NOTE  [name], MD [address] [place] [address]  DIAGNOSIS: Extensive stage (T2a, N3, [name], large right anterior mediastinal and supraclavicular lymphadenopathy as well as pancreatic and splenic metastasis diagnosed in September 2017. The patient had disease progression in October 2019.  PRIOR THERAPY: 1) Systemic chemotherapy was carboplatin for AUC of 5 on day 1 and etoposide 100 MG/M2 on days 1, 2 and 3 with Neulasta support. Status post 6 cycles with significant response of her disease. 2) Prophylactic cranial irradiation under the care of Dr. Sondra Come on 12/02/2016. 3) stereotactic radiotherapy to the recurrent right upper lobe pulmonary nodule under the care of Dr. Sondra Come completed November 10, 2017. 4)Retreatment with systemic chemotherapy with carboplatin for AUC of 5 on day 1 and etoposide 100 mg/M2 on days 1, 2 and 3 as well as Tecentriq (Atezolizumab) 1200 mg IV every 3 weeks with Neulasta support. First dose June 22, 2018 for disease recurrence. Status post 5 cycles. Starting from cycle #2 her dose of carboplatin will be reduced to AUC of 4 and etoposide 80 mg/M2 on days 1, 2 and 3 in addition to the regular dose of Tecentriq  CURRENT THERAPY: Maintenance treatment with single agent Tecentriq 1200 mg IV every 3 weeks. Status post29cycles  INTERVAL HISTORY: Debra Kidd 59 y.o. female returns to the clinic for a follow up visit. The patient is feeling well today without any concerning complaints.The patient continues to tolerate treatment with immunotherapy with Tecentriqwell without any adverse effects except itching for which she uses atarax. Her itching has been better recently. 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 The patient reports that they did not recommend any intervention. She is inquiring where  to get the COVID 19 vaccine. She had COVID in the past as well as a blood clot and she is worried about getting the [employer] vaccine. She is inquiring where they give out Moderna vaccines. Denies any fever, chills, night sweats, or weight loss. She actually has gained weight since her last treatment. Denies any chest pain or hemoptysisbut endorses her baseline cough and dyspnea on exertion. Denies any nausea, vomiting, diarrhea, or constipation. Denies any headache or visual changes. The patient recently had a restaging CT scan. The patient is here today for evaluation and to review her scan results prior to starting cycle #30   MEDICAL HISTORY: Past Medical History:  Diagnosis Date  . 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 Blood type, Rh positive   . Cancer ([place])   . Chronic pain syndrome   . Depression 08/07/2016  . Encounter for antineoplastic chemotherapy 07/17/2016  . History of external beam radiation therapy 11/19/16-12/02/16   brain 25 Gy in 10 fractions  . Hyperlipidemia   . Hypertension   . Hypertension 08/07/2016  . Osteoporosis   . Small cell lung cancer ([place]) dx'd 05/2016    ALLERGIES:  is allergic to codeine, motrin [ibuprofen], thiazide-type diuretics, and vicodin [hydrocodone-acetaminophen].  MEDICATIONS:  Current Outpatient Medications  Medication Sig Dispense Refill  . clopidogrel (PLAVIX) 75 MG tablet Take 1 tablet (75 mg total) by mouth daily. 30 tablet 0  . diazepam (VALIUM) 5 MG tablet Take 5 mg by mouth 3 (three) times daily as needed for anxiety.   2  . hydrOXYzine (ATARAX/VISTARIL) 10 MG tablet Take 1 tablet (  10 mg total) by mouth 3 (three) times daily as needed. 90 tablet 1  . lidocaine-prilocaine (EMLA) cream Apply 1 application topically as needed. 30 g 0  . oxyCODONE-acetaminophen (PERCOCET) 5-325 MG tablet Take 1 tablet by mouth every 6 (six) hours as needed for severe pain. 30 tablet 0  . polyethylene glycol (MIRALAX / GLYCOLAX) packet  Take 17 g by mouth daily. 14 each 0  . potassium chloride 20 MEQ/15ML (10%) SOLN Take 15 mLs (20 mEq total) by mouth daily. 100 mL 0  . prochlorperazine (COMPAZINE) 10 MG tablet Take 1 tablet (10 mg total) by mouth every 6 (six) hours as needed for nausea or vomiting. (Patient not taking: Reported on 03/09/2020) 30 tablet 0  . traMADol (ULTRAM) 50 MG tablet Take 50 mg by mouth every 6 (six) hours as needed for moderate pain.     . Vitamin D, Ergocalciferol, (DRISDOL) 1.25 MG (50000 UT) CAPS capsule Take 1 capsule (50,000 Units total) by mouth every 7 (seven) days. 5 capsule 0   No current facility-administered medications for this visit.    SURGICAL HISTORY:  Past Surgical History:  Procedure Laterality Date  . ABDOMINAL HYSTERECTOMY  1981  . APPENDECTOMY     at age 45  . EYE SURGERY     left  . IR GENERIC HISTORICAL  06/03/2016   IR FLUORO GUIDE PORT INSERTION RIGHT 06/03/2016 [facility]  . IR GENERIC HISTORICAL  06/03/2016   IR US GUIDE VASC ACCESS RIGHT 06/03/2016 [facility]  . SKIN CANCER EXCISION     basal cell carcinoma L side of face    REVIEW OF SYSTEMS:   Constitutional: Negative for appetite change, chills, fatigue, fever and unexpected weight change.  HENT: Negative for mouth sores, nosebleeds, sore throat and trouble swallowing.  Eyes: Negative for eye problems and icterus.  Respiratory:Positive for baseline cough and shortness of breath with exertion.Negative for hemoptysis and wheezing.  Cardiovascular: Negative for chest pain and leg swelling.  Gastrointestinal: Negative for abdominal pain, constipation, diarrhea, nausea and vomiting.  Genitourinary: Negative for bladder incontinence, difficulty urinating, dysuria, frequency and hematuria.  Musculoskeletal: Negative for back pain, gait problem, neck pain and neck stiffness.  Skin:Positive for itching (improved from prior).Negative for rash.  Neurological:Positive for limping secondary to history of  stroke.Negative for dizziness, extremity weakness, headaches, [name], depression and sleep disturbance. The patient is not nervous/anxious.    PHYSICAL EXAMINATION:  Blood pressure (!) 106/86, pulse 77, temperature 97.7 F (36.5 C), temperature source Oral, resp. rate 17, height 5\' 3"  (1.6 m), weight 124 lb 4.8 oz (56.4 kg), SpO2 100 %.  ECOG PERFORMANCE STATUS: 1 - Symptomatic but completely ambulatory  Physical Exam  Constitutional: Oriented to person, place, and time andchronically ill appearing femaleand in no distress.  HENT:  Head: Normocephalic and atraumatic.  Mouth/Throat: Oropharynx is clear and moist. No oropharyngeal exudate.  Eyes: Conjunctivae are normal. Right eye exhibits no discharge. Left eye exhibits no discharge. No scleral icterus.  Neck: Normal range of motion. Neck supple.  Cardiovascular: Normal rate, regular rhythm, normal heart sounds and intact distal pulses.  Pulmonary/Chest: Effort normal and breath sounds normal. No respiratory distress. No wheezes. No rales.  Abdominal: Soft. Bowel sounds are normal. Exhibits no distension and no mass. There is no tenderness.  Musculoskeletal: Normal range of motion. Exhibits no edema.  Lymphadenopathy:  No cervical adenopathy.  Neurological: Alert and oriented to person,  place, and time. Exhibits normal muscle tone. Positive for limping due to history of stroke. Skin: Skin is warm and dry. No rash noted. Not diaphoretic. No erythema. No pallor.  Psychiatric: Mood, memory and judgment normal.  Vitals reviewed.  LABORATORY DATA: Lab Results  Component Value Date   WBC 6.6 03/29/2020   HGB 13.6 03/29/2020   HCT 39.7 03/29/2020   MCV 100.8 (H) 03/29/2020   PLT 206 03/29/2020      Chemistry      Component Value Date/Time   NA 140 03/29/2020 0813   NA 141 08/01/2017  0835   K 3.1 (L) 03/29/2020 0813   K 3.5 08/01/2017 0835   CL 104 03/29/2020 0813   CO2 26 03/29/2020 0813   CO2 25 08/01/2017 0835   BUN 5 (L) 03/29/2020 0813   BUN 8.0 08/01/2017 0835   CREATININE 0.82 03/29/2020 0813   CREATININE 0.9 08/01/2017 0835      Component Value Date/Time   CALCIUM 9.0 03/29/2020 0813   CALCIUM 9.9 08/01/2017 0835   ALKPHOS 109 03/29/2020 0813   ALKPHOS 142 08/01/2017 0835   AST 14 (L) 03/29/2020 0813   AST 15 08/01/2017 0835   ALT 11 03/29/2020 0813   ALT 19 08/01/2017 0835   BILITOT 0.4 03/29/2020 0813   BILITOT 0.35 08/01/2017 0835       RADIOGRAPHIC STUDIES:  CT Chest W Contrast  Result Date: 03/07/2020 CLINICAL DATA:  Restaging small cell lung cancer. Immunotherapy in progress. EXAM: CT CHEST, ABDOMEN, AND PELVIS WITH CONTRAST TECHNIQUE: Multidetector CT imaging of the chest, abdomen and pelvis was performed following the standard protocol during bolus administration of intravenous contrast. CONTRAST:  110mL OMNIPAQUE IOHEXOL 300 MG/ML  SOLN COMPARISON:  Multiple exams, including 01/04/2020 0.9 cm right hilar lymph node on image 52/4. FINDINGS: CT CHEST FINDINGS Cardiovascular: Right Port-A-Cath tip: Lower SVC. Coronary, aortic arch, and branch vessel atherosclerotic vascular disease. High-grade stenosis of the proximal right multifocal high-grade stenosis of the right subclavian artery. Moderate stenosis of the left subclavian artery due to atheromatous plaque on image 70/4. Severe stenosis with near occlusion of the left common carotid artery particularly near the origin. Mediastinum/Nodes: Right hilar lymph node 0.9 cm in short axis on image 52/4, stable by my measurements. Lungs/Pleura: Stable right apical pleural based density peripherally probably from scarring and treatment related findings, no significant change in contour to favor active malignancy. Similar bandlike density in the posterior basal segment right lower lobe likely from scarring.  Stable lingular and left lower lobe scarring peripherally. Musculoskeletal: Old healed left lower posterior rib fractures. Stable compression fractures at T7, T8, and T9. New anterior wedging at the C6 vertebral body level for example on image 74/5. CT ABDOMEN PELVIS FINDINGS Hepatobiliary: Mildly contracted gallbladder. Otherwise unremarkable. Pancreas: Unremarkable Spleen: Unremarkable Adrenals/Urinary Tract: Unremarkable Stomach/Bowel: Unremarkable Vascular/Lymphatic: Aortoiliac atherosclerotic vascular disease. High-grade stenosis of the superior mesenteric artery proximally for example on image 58/2. No pathologic adenopathy observed. Reproductive: Uterus absent.  Adnexa unremarkable. 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 Musculoskeletal: Unchanged L2 superior endplate compression fracture. IMPRESSION: 1. Stable right apical pleural based density peripherally probably from scarring and treatment related findings, no significant change in contour to favor active malignancy. 2. 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 Moderate stenosis in the left subclavian artery. 3. Stable compression fractures at T7, T8, T9, and L2. New anterior wedging at the  C6 vertebral body level compared to 01/04/2019 1 may represent a new lower cervical wedge compression fracture. 4. Other imaging findings of potential clinical significance: Coronary, aortic arch, and branch vessel atherosclerotic vascular disease. 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 5. Aortic atherosclerosis. Aortic Atherosclerosis (ICD10-I70.0). Electronically Signed   By: Van Clines M.D.   On: 03/07/2020 09:16   CT Abdomen Pelvis W Contrast  Result Date: 03/07/2020 CLINICAL DATA:  Restaging small cell lung cancer. Immunotherapy in progress. EXAM: CT  CHEST, ABDOMEN, AND PELVIS WITH CONTRAST TECHNIQUE: Multidetector CT imaging of the chest, abdomen and pelvis was performed following the standard protocol during bolus administration of intravenous contrast. CONTRAST:  132mL OMNIPAQUE IOHEXOL 300 MG/ML  SOLN COMPARISON:  Multiple exams, including 01/04/2020 0.9 cm right hilar lymph node on image 52/4. FINDINGS: CT CHEST FINDINGS Cardiovascular: Right Port-A-Cath tip: Lower SVC. Coronary, aortic arch, and branch vessel atherosclerotic vascular disease. High-grade stenosis of the proximal right multifocal high-grade stenosis of the right subclavian artery. Moderate stenosis of the left subclavian artery due to atheromatous plaque on image 70/4. Severe stenosis with near occlusion of the left common carotid artery particularly near the origin. Mediastinum/Nodes: Right hilar lymph node 0.9 cm in short axis on image 52/4, stable by my measurements. Lungs/Pleura: Stable right apical pleural based density peripherally probably from scarring and treatment related findings, no significant change in contour to favor active malignancy. Similar bandlike density in the posterior basal segment right lower lobe likely from scarring. Stable lingular and left lower lobe scarring peripherally. Musculoskeletal: Old healed left lower posterior rib fractures. Stable compression fractures at T7, T8, and T9. New anterior wedging at the C6 vertebral body level for example on image 74/5. CT ABDOMEN PELVIS FINDINGS Hepatobiliary: Mildly contracted gallbladder. Otherwise unremarkable. Pancreas: Unremarkable Spleen: Unremarkable Adrenals/Urinary Tract: Unremarkable Stomach/Bowel: Unremarkable Vascular/Lymphatic: Aortoiliac atherosclerotic vascular disease. High-grade stenosis of the superior mesenteric artery proximally for example on image 58/2. No pathologic adenopathy observed. Reproductive: Uterus absent.  Adnexa unremarkable. Other: Stable hazy stranding in the root of the mesentery  probably from mild chronic sclerosing mesenteritis, unchanged. Musculoskeletal: Unchanged L2 superior endplate compression fracture. IMPRESSION: 1. Stable right apical pleural based density peripherally probably from scarring and treatment related findings, no significant change in contour to favor active malignancy. 2. 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 Moderate stenosis in the left subclavian artery. 3. Stable compression fractures at T7, T8, T9, and L2. New anterior wedging at the C6 vertebral body level compared to 01/04/2019 1 may represent a new lower cervical wedge compression fracture. 4. Other imaging findings of potential clinical significance: Coronary, aortic arch, and branch vessel atherosclerotic vascular disease. 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 5. Aortic atherosclerosis. Aortic Atherosclerosis (ICD10-I70.0). Electronically Signed   By: Van Clines M.D.   On: 03/07/2020 09:16     ASSESSMENT/PLAN:  This is a very pleasant 59 year old Caucasian female with extensive stage small cell lung cancer. She presented with a right upper lobe lung mass, large right anterior mediastinal and supraclavicular lymphadenopathy as well as a pancreatic andsplenic metastasis. She was diagnosed in September 2017.  The patient underwent systemic chemotherapy with carboplatin and etoposide. She is status post 6 cycles. She tolerated treatment well except for chemotherapy-induced anemia which required pRBCs and platelet transfusions. She had a significant improvement of her disease with chemotherapy.The patientthenunderwent prophylactic cranial irradiation. She then underwent stereotactic radiotherapy to the right upper lobe and pulmonary nodule.  She had been on observation for 2 years before showing evidence of disease progression.   She recently hadbeen  started on systemic chemotherapy with carboplatin, etoposide, and Tecentriq. Starting from cycle #5, she has been on maintenance single agent Tecentriq. She has been tolerating treatment well.She is status post29cycles of single agent Tecentriq.  The patient recently had a restaging CT scan. Dr. Julien Nordmann personally and independently reviewed the scan and discussed the results with the patient. The scan did not show any evidence of disease progression. Recommend that she proceed on the same treatment at the same dose. She will proceed with cycle #30today as scheduled.   We will see her back for a follow up visit in 3 weeks for evaluation before starting cycle #31.   She will continue to take atarax for itching if needed.    She was given a handout on locations to receive a covid 19 vaccine. She was encouraged to receive her vaccine. Her preference is the moderna vaccine.   Her potassium is a little low at 3.1. I sent a prescription for 20 meq daily for 7 days to her pharmacy.   The patient was advised to call immediately if she has any concerning symptoms in the interval. The patient voices understanding of current disease status and treatment options and is in agreement with the current care plan. All questions were answered. The patient knows to call the clinic with any problems, questions or concerns. We can certainly see the patient much sooner if necessary  No orders of the defined types were placed in this encounter.   Quinnie Barcelo L Ramiah Helfrich, PA-C 03/29/20   ADDENDUM: Hematology/Oncology Attending: I had a face-to-face encounter with the patient today.  I recommended her care plan.  This is a very pleasant 59 years old white female with recurrent/metastatic small cell lung cancer status post systemic chemotherapy with carboplatin, Alimta and Tecentriq status post 4 cycles.  She is currently on maintenance treatment with Tecentriq every 3 weeks is status post 25 more cycles.   The patient has been tolerating this treatment well with no concerning adverse effects. She had repeat CT scan of the chest, abdomen pelvis performed recently.  I personally and independently reviewed the scans and discussed the results with the patient today. Her scan showed no concerning findings for disease progression. I recommended for the patient to continue her current treatment with maintenance Tecentriq and she will proceed with cycle #26 of the maintenance therapy today. She will come back for follow-up visit in 3 weeks for evaluation before the next cycle of her treatment. For the hypokalemia, I will start the patient on potassium chloride supplements. She was advised to call immediately if she has any concerning symptoms in the interval.  Disclaimer: This note was dictated with voice recognition software. Similar sounding words can inadvertently be transcribed and may be missed upon review. Eilleen Kempf, MD 03/29/20

## 2020-03-29 ENCOUNTER — Other Ambulatory Visit: Payer: Self-pay

## 2020-03-29 ENCOUNTER — Inpatient Hospital Stay (HOSPITAL_BASED_OUTPATIENT_CLINIC_OR_DEPARTMENT_OTHER): Payer: Medicare Other | Admitting: Physician Assistant

## 2020-03-29 ENCOUNTER — Inpatient Hospital Stay: Payer: Medicare Other

## 2020-03-29 ENCOUNTER — Encounter: Payer: Self-pay | Admitting: Physician Assistant

## 2020-03-29 VITALS — BP 106/86 | HR 77 | Temp 97.7°F | Resp 17 | Ht 63.0 in | Wt 124.3 lb

## 2020-03-29 DIAGNOSIS — E876 Hypokalemia: Secondary | ICD-10-CM | POA: Diagnosis not present

## 2020-03-29 DIAGNOSIS — Z5112 Encounter for antineoplastic immunotherapy: Secondary | ICD-10-CM | POA: Diagnosis not present

## 2020-03-29 DIAGNOSIS — C3491 Malignant neoplasm of unspecified part of right bronchus or lung: Secondary | ICD-10-CM

## 2020-03-29 DIAGNOSIS — R5383 Other fatigue: Secondary | ICD-10-CM

## 2020-03-29 LAB — CMP (CANCER CENTER ONLY)
ALT: 11 U/L (ref 0–44)
AST: 14 U/L — ABNORMAL LOW (ref 15–41)
Albumin: 3.5 g/dL (ref 3.5–5.0)
Alkaline Phosphatase: 109 U/L (ref 38–126)
Anion gap: 10 (ref 5–15)
BUN: 5 mg/dL — ABNORMAL LOW (ref 8–23)
CO2: 26 mmol/L (ref 22–32)
Calcium: 9 mg/dL (ref 8.9–10.3)
Chloride: 104 mmol/L (ref 98–111)
Creatinine: 0.82 mg/dL (ref 0.44–1.00)
GFR, Est AFR Am: >60 mL/min (ref >60)
GFR, Estimated: >60 mL/min (ref >60)
Glucose, Bld: 98 mg/dL (ref 70–99)
Potassium: 3.1 mmol/L — ABNORMAL LOW (ref 3.5–5.1)
Sodium: 140 mmol/L (ref 135–145)
Total Bilirubin: 0.4 mg/dL (ref 0.3–1.2)
Total Protein: 6.7 g/dL (ref 6.5–8.1)

## 2020-03-29 LAB — CBC WITH DIFFERENTIAL (CANCER CENTER ONLY)
Abs Immature Granulocytes: 0.02 10*3/uL (ref 0.00–0.07)
Basophils Absolute: 0 10*3/uL (ref 0.0–0.1)
Basophils Relative: 1 %
Eosinophils Absolute: 0.3 10*3/uL (ref 0.0–0.5)
Eosinophils Relative: 4 %
HCT: 39.7 % (ref 36.0–46.0)
Hemoglobin: 13.6 g/dL (ref 12.0–15.0)
Immature Granulocytes: 0 %
Lymphocytes Relative: 30 %
Lymphs Abs: 2 10*3/uL (ref 0.7–4.0)
MCH: 34.5 pg — ABNORMAL HIGH (ref 26.0–34.0)
MCHC: 34.3 g/dL (ref 30.0–36.0)
MCV: 100.8 fL — ABNORMAL HIGH (ref 80.0–100.0)
Monocytes Absolute: 0.4 10*3/uL (ref 0.1–1.0)
Monocytes Relative: 6 %
Neutro Abs: 3.9 10*3/uL (ref 1.7–7.7)
Neutrophils Relative %: 59 %
Platelet Count: 206 10*3/uL (ref 150–400)
RBC: 3.94 MIL/uL (ref 3.87–5.11)
RDW: 13.2 % (ref 11.5–15.5)
WBC Count: 6.6 10*3/uL (ref 4.0–10.5)
nRBC: 0 % (ref 0.0–0.2)

## 2020-03-29 LAB — TSH: TSH: 2.182 u[IU]/mL (ref 0.308–3.960)

## 2020-03-29 MED ORDER — SODIUM CHLORIDE 0.9 % IV SOLN
1200.0000 mg | Freq: Once | INTRAVENOUS | Status: AC
  Administered 2020-03-29: 1200 mg via INTRAVENOUS
  Filled 2020-03-29: qty 20

## 2020-03-29 MED ORDER — SODIUM CHLORIDE 0.9% FLUSH
10.0000 mL | INTRAVENOUS | Status: DC | PRN
  Administered 2020-03-29: 10 mL
  Filled 2020-03-29: qty 10

## 2020-03-29 MED ORDER — SODIUM CHLORIDE 0.9 % IV SOLN
Freq: Once | INTRAVENOUS | Status: AC
  Filled 2020-03-29: qty 250

## 2020-03-29 MED ORDER — POTASSIUM CHLORIDE 20 MEQ/15ML (10%) PO SOLN: 20.0000 meq | Freq: Every day | ORAL | 0 refills | Status: DC

## 2020-03-29 MED ORDER — HEPARIN SOD (PORK) LOCK FLUSH 100 UNIT/ML IV SOLN
500.0000 [IU] | Freq: Once | INTRAVENOUS | Status: AC | PRN
  Administered 2020-03-29: 500 [IU]
  Filled 2020-03-29: qty 5

## 2020-03-29 NOTE — Patient Instructions (Signed)
[facility] Discharge Instructions for Patients Receiving Chemotherapy  Today you received the following chemotherapy agents: Tecentriq  To help prevent nausea and vomiting after your treatment, we encourage you to take your nausea medication as directed.   If you develop nausea and vomiting that is not controlled by your nausea medication, call the clinic.   BELOW ARE SYMPTOMS THAT SHOULD BE REPORTED IMMEDIATELY:  *FEVER GREATER THAN 100.5 F  *CHILLS WITH OR WITHOUT FEVER  NAUSEA AND VOMITING THAT IS NOT CONTROLLED WITH YOUR NAUSEA MEDICATION  *UNUSUAL SHORTNESS OF BREATH  *UNUSUAL BRUISING OR BLEEDING  TENDERNESS IN MOUTH AND THROAT WITH OR WITHOUT PRESENCE OF ULCERS  *URINARY PROBLEMS  *BOWEL PROBLEMS  UNUSUAL RASH Items with * indicate a potential emergency and should be followed up as soon as possible.  Feel free to call the clinic should you have any questions or concerns. The clinic phone number is [phone].  Please show the CHEMO ALERT CARD at check-in to the Emergency Department and triage nurse.   

## 2020-03-29 NOTE — Patient Instructions (Signed)
Implanted Port Insertion, Care After °This sheet gives you information about how to care for yourself after your procedure. Your health care provider may also give you more specific instructions. If you have problems or questions, contact your health care provider. °What can I expect after the procedure? °After the procedure, it is common to have: °· Discomfort at the port insertion site. °· Bruising on the skin over the port. This should improve over 3-4 days. °Follow these instructions at home: °Port care °· After your port is placed, you will get a manufacturer's information card. The card has information about your port. Keep this card with you at all times. °· Take care of the port as told by your health care provider. Ask your health care provider if you or a family member can get training for taking care of the port at home. A home health care nurse may also take care of the port. °· Make sure to remember what type of port you have. °Incision care ° °  ° °· Follow instructions from your health care provider about how to take care of your port insertion site. Make sure you: °? Wash your hands with soap and water before and after you change your bandage (dressing). If soap and water are not available, use hand sanitizer. °? Change your dressing as told by your health care provider. °? Leave stitches (sutures), skin glue, or adhesive strips in place. These skin closures may need to stay in place for 2 weeks or longer. If adhesive strip edges start to loosen and curl up, you may trim the loose edges. Do not remove adhesive strips completely unless your health care provider tells you to do that. °· Check your port insertion site every day for signs of infection. Check for: °? Redness, swelling, or pain. °? Fluid or blood. °? Warmth. °? Pus or a bad smell. °Activity °· Return to your normal activities as told by your health care provider. Ask your health care provider what activities are safe for you. °· Do not  lift anything that is heavier than 10 lb (4.5 kg), or the limit that you are told, until your health care provider says that it is safe. °General instructions °· Take over-the-counter and prescription medicines only as told by your health care provider. °· Do not take baths, swim, or use a hot tub until your health care provider approves. Ask your health care provider if you may take showers. You may only be allowed to take sponge baths. °· Do not drive for 24 hours if you were given a sedative during your procedure. °· Wear a medical alert bracelet in case of an emergency. This will tell any health care providers that you have a port. °· Keep all follow-up visits as told by your health care provider. This is important. °Contact a health care provider if: °· You cannot flush your port with saline as directed, or you cannot draw blood from the port. °· You have a fever or chills. °· You have redness, swelling, or pain around your port insertion site. °· You have fluid or blood coming from your port insertion site. °· Your port insertion site feels warm to the touch. °· You have pus or a bad smell coming from the port insertion site. °Get help right away if: °· You have chest pain or shortness of breath. °· You have bleeding from your port that you cannot control. °Summary °· Take care of the port as told by your health   care provider. Keep the manufacturer's information card with you at all times. °· Change your dressing as told by your health care provider. °· Contact a health care provider if you have a fever or chills or if you have redness, swelling, or pain around your port insertion site. °· Keep all follow-up visits as told by your health care provider. °This information is not intended to replace advice given to you by your health care provider. Make sure you discuss any questions you have with your health care provider. °Document Revised: 03/17/2018 Document Reviewed: 03/17/2018 °Elsevier Patient Education ©  2020 [employer] ° °

## 2020-03-31 ENCOUNTER — Telehealth: Payer: Self-pay | Admitting: Internal Medicine

## 2020-03-31 NOTE — Telephone Encounter (Signed)
Scheduled appointments per 7/28 los. Patient is aware of all appointments dates and times.

## 2020-04-20 ENCOUNTER — Other Ambulatory Visit: Payer: Self-pay

## 2020-04-20 ENCOUNTER — Inpatient Hospital Stay: Payer: Medicare Other

## 2020-04-20 ENCOUNTER — Inpatient Hospital Stay: Payer: Medicare Other | Attending: Internal Medicine | Admitting: Internal Medicine

## 2020-04-20 ENCOUNTER — Encounter: Payer: Self-pay | Admitting: Internal Medicine

## 2020-04-20 ENCOUNTER — Telehealth: Payer: Self-pay

## 2020-04-20 ENCOUNTER — Other Ambulatory Visit: Payer: Medicare Other

## 2020-04-20 ENCOUNTER — Other Ambulatory Visit: Payer: Self-pay | Admitting: Physician Assistant

## 2020-04-20 VITALS — BP 132/90 | HR 76 | Temp 97.7°F | Resp 20 | Ht 63.0 in | Wt 123.9 lb

## 2020-04-20 DIAGNOSIS — Z5112 Encounter for antineoplastic immunotherapy: Secondary | ICD-10-CM | POA: Insufficient documentation

## 2020-04-20 DIAGNOSIS — Z9221 Personal history of antineoplastic chemotherapy: Secondary | ICD-10-CM | POA: Insufficient documentation

## 2020-04-20 DIAGNOSIS — L299 Pruritus, unspecified: Secondary | ICD-10-CM | POA: Insufficient documentation

## 2020-04-20 DIAGNOSIS — Z923 Personal history of irradiation: Secondary | ICD-10-CM | POA: Insufficient documentation

## 2020-04-20 DIAGNOSIS — C3491 Malignant neoplasm of unspecified part of right bronchus or lung: Secondary | ICD-10-CM | POA: Diagnosis not present

## 2020-04-20 DIAGNOSIS — E785 Hyperlipidemia, unspecified: Secondary | ICD-10-CM | POA: Insufficient documentation

## 2020-04-20 DIAGNOSIS — M810 Age-related osteoporosis without current pathological fracture: Secondary | ICD-10-CM | POA: Diagnosis not present

## 2020-04-20 DIAGNOSIS — Z85828 Personal history of other malignant neoplasm of skin: Secondary | ICD-10-CM | POA: Diagnosis not present

## 2020-04-20 DIAGNOSIS — G894 Chronic pain syndrome: Secondary | ICD-10-CM | POA: Diagnosis not present

## 2020-04-20 DIAGNOSIS — I10 Essential (primary) hypertension: Secondary | ICD-10-CM | POA: Insufficient documentation

## 2020-04-20 DIAGNOSIS — F419 Anxiety disorder, unspecified: Secondary | ICD-10-CM | POA: Insufficient documentation

## 2020-04-20 DIAGNOSIS — E876 Hypokalemia: Secondary | ICD-10-CM

## 2020-04-20 DIAGNOSIS — R5383 Other fatigue: Secondary | ICD-10-CM

## 2020-04-20 DIAGNOSIS — C3411 Malignant neoplasm of upper lobe, right bronchus or lung: Secondary | ICD-10-CM | POA: Insufficient documentation

## 2020-04-20 DIAGNOSIS — Z79899 Other long term (current) drug therapy: Secondary | ICD-10-CM | POA: Diagnosis not present

## 2020-04-20 LAB — CBC WITH DIFFERENTIAL (CANCER CENTER ONLY)
Abs Immature Granulocytes: 0.02 10*3/uL (ref 0.00–0.07)
Basophils Absolute: 0 10*3/uL (ref 0.0–0.1)
Basophils Relative: 1 %
Eosinophils Absolute: 0.2 10*3/uL (ref 0.0–0.5)
Eosinophils Relative: 3 %
HCT: 38.5 % (ref 36.0–46.0)
Hemoglobin: 13.1 g/dL (ref 12.0–15.0)
Immature Granulocytes: 0 %
Lymphocytes Relative: 28 %
Lymphs Abs: 2 10*3/uL (ref 0.7–4.0)
MCH: 33.7 pg (ref 26.0–34.0)
MCHC: 34 g/dL (ref 30.0–36.0)
MCV: 99 fL (ref 80.0–100.0)
Monocytes Absolute: 0.5 10*3/uL (ref 0.1–1.0)
Monocytes Relative: 7 %
Neutro Abs: 4.2 10*3/uL (ref 1.7–7.7)
Neutrophils Relative %: 61 %
Platelet Count: 240 10*3/uL (ref 150–400)
RBC: 3.89 MIL/uL (ref 3.87–5.11)
RDW: 12.6 % (ref 11.5–15.5)
WBC Count: 7 10*3/uL (ref 4.0–10.5)
nRBC: 0 % (ref 0.0–0.2)

## 2020-04-20 LAB — CMP (CANCER CENTER ONLY)
ALT: 14 U/L (ref 0–44)
AST: 14 U/L — ABNORMAL LOW (ref 15–41)
Albumin: 3.4 g/dL — ABNORMAL LOW (ref 3.5–5.0)
Alkaline Phosphatase: 114 U/L (ref 38–126)
Anion gap: 9 (ref 5–15)
BUN: 6 mg/dL — ABNORMAL LOW (ref 8–23)
CO2: 25 mmol/L (ref 22–32)
Calcium: 9.2 mg/dL (ref 8.9–10.3)
Chloride: 103 mmol/L (ref 98–111)
Creatinine: 0.83 mg/dL (ref 0.44–1.00)
GFR, Est AFR Am: >60 mL/min (ref >60)
GFR, Estimated: >60 mL/min (ref >60)
Glucose, Bld: 98 mg/dL (ref 70–99)
Potassium: 3.1 mmol/L — ABNORMAL LOW (ref 3.5–5.1)
Sodium: 137 mmol/L (ref 135–145)
Total Bilirubin: 0.3 mg/dL (ref 0.3–1.2)
Total Protein: 6.8 g/dL (ref 6.5–8.1)

## 2020-04-20 LAB — TSH: TSH: 2.178 u[IU]/mL (ref 0.308–3.960)

## 2020-04-20 MED ORDER — SODIUM CHLORIDE 0.9 % IV SOLN
Freq: Once | INTRAVENOUS | Status: AC
  Filled 2020-04-20: qty 250

## 2020-04-20 MED ORDER — HEPARIN SOD (PORK) LOCK FLUSH 100 UNIT/ML IV SOLN
500.0000 [IU] | Freq: Once | INTRAVENOUS | Status: AC | PRN
  Administered 2020-04-20: 500 [IU]
  Filled 2020-04-20: qty 5

## 2020-04-20 MED ORDER — SODIUM CHLORIDE 0.9% FLUSH
10.0000 mL | INTRAVENOUS | Status: DC | PRN
  Administered 2020-04-20: 10 mL
  Filled 2020-04-20: qty 10

## 2020-04-20 MED ORDER — POTASSIUM CHLORIDE 20 MEQ/15ML (10%) PO SOLN: 20.0000 meq | Freq: Every day | ORAL | 0 refills | Status: DC

## 2020-04-20 MED ORDER — SODIUM CHLORIDE 0.9 % IV SOLN
1200.0000 mg | Freq: Once | INTRAVENOUS | Status: AC
  Administered 2020-04-20: 1200 mg via INTRAVENOUS
  Filled 2020-04-20: qty 20

## 2020-04-20 NOTE — Telephone Encounter (Signed)
TC to pt per Cassie PA to let her know that Cassie is going to be sending potassium to her pharmacy.patient verbalized understanding and is fine with taking potassium supplements.

## 2020-04-20 NOTE — Progress Notes (Signed)
[facility] [phone]   [phone]) [phone]  OFFICE PROGRESS NOTE  [name], MD [address] [place] [address]  DIAGNOSIS: Extensive stage (T2a, N3, [name], large right anterior mediastinal and supraclavicular lymphadenopathy as well as pancreatic and splenic metastasis diagnosed in September 2017.  The patient had disease progression in October 2019.  PRIOR THERAPY:  1) Systemic chemotherapy was carboplatin for AUC of 5 on day 1 and etoposide 100 MG/M2 on days 1, 2 and 3 with Neulasta support. Status post 6 cycles with significant response of her disease. 2) Prophylactic cranial irradiation under the care of Dr. Sondra Come on 12/02/2016. 3) stereotactic radiotherapy to the recurrent right upper lobe pulmonary nodule under the care of Dr. Sondra Come completed November 10, 2017. 4) Retreatment with systemic chemotherapy with carboplatin for AUC of 5 on day 1 and etoposide 100 mg/M2 on days 1, 2 and 3 as well as Tecentriq (Atezolizumab) 1200 mg IV every 3 weeks with Neulasta support.  First dose June 22, 2018 for disease recurrence.  Status post 5 cycles.  Starting from cycle #2 her dose of carboplatin will be reduced to AUC of 4 and etoposide 80 mg/M2 on days 1, 2 and 3 in addition to the regular dose of Tecentriq.  CURRENT THERAPY: Maintenance treatment with single agent Tecentriq 1200 mg IV every 3 weeks.  Status post 25 cycles.  INTERVAL HISTORY: Debra Kidd 59 y.o. female returns to the clinic today for follow-up visit.  The patient is feeling fine today with no concerning complaints except for increased distress dealing with her neighbors.  The patient denied having any chest pain, shortness of breath, cough or hemoptysis.  She denied having any fever or chills.  She has no nausea, vomiting, diarrhea or constipation.  She has no headache or visual changes.  She continues to tolerate her  treatment with Tecentriq fairly well.  The patient is here today for evaluation before starting cycle #26 of her treatment.   MEDICAL HISTORY: Past Medical History:  Diagnosis Date  . 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 Blood type, Rh positive   . Cancer ([facility])   . Chronic pain syndrome   . Depression 08/07/2016  . Encounter for antineoplastic chemotherapy 07/17/2016  . History of external beam radiation therapy 11/19/16-12/02/16   brain 25 Gy in 10 fractions  . Hyperlipidemia   . Hypertension   . Hypertension 08/07/2016  . Osteoporosis   . Small cell lung cancer ([facility]) dx'd 05/2016    ALLERGIES:  is allergic to codeine, motrin [ibuprofen], thiazide-type diuretics, and vicodin [hydrocodone-acetaminophen].  MEDICATIONS:  Current Outpatient Medications  Medication Sig Dispense Refill  . clopidogrel (PLAVIX) 75 MG tablet Take 1 tablet (75 mg total) by mouth daily. 30 tablet 0  . diazepam (VALIUM) 5 MG tablet Take 5 mg by mouth 3 (three) times daily as needed for anxiety.   2  . hydrOXYzine (ATARAX/VISTARIL) 10 MG tablet Take 1 tablet (10 mg total) by mouth 3 (three) times daily as needed. 90 tablet 1  . lidocaine-prilocaine (EMLA) cream Apply 1 application topically as needed. 30 g 0  . oxyCODONE-acetaminophen (PERCOCET) 5-325 MG tablet Take 1 tablet by mouth every 6 (six) hours as needed for severe pain. 30 tablet 0  . polyethylene glycol (MIRALAX / GLYCOLAX) packet Take 17 g by mouth daily. 14 each 0  .  potassium chloride 20 MEQ/15ML (10%) SOLN Take 15 mLs (20 mEq total) by mouth daily. 100 mL 0  . prochlorperazine (COMPAZINE) 10 MG tablet Take 1 tablet (10 mg total) by mouth every 6 (six) hours as needed for nausea or vomiting. (Patient not taking: Reported on 03/09/2020) 30 tablet 0  . traMADol (ULTRAM) 50 MG tablet Take 50 mg by mouth every 6 (six) hours as needed for moderate pain.     . Vitamin D, Ergocalciferol, (DRISDOL) 1.25 MG (50000 UT) CAPS capsule Take 1 capsule  (50,000 Units total) by mouth every 7 (seven) days. 5 capsule 0   No current facility-administered medications for this visit.    SURGICAL HISTORY:  Past Surgical History:  Procedure Laterality Date  . ABDOMINAL HYSTERECTOMY  1981  . APPENDECTOMY     at age 80  . EYE SURGERY     left  . IR GENERIC HISTORICAL  06/03/2016   IR FLUORO GUIDE PORT INSERTION RIGHT 06/03/2016 [facility]  . IR GENERIC HISTORICAL  06/03/2016   IR US GUIDE VASC ACCESS RIGHT 06/03/2016 [facility]  . SKIN CANCER EXCISION     basal cell carcinoma L side of face    REVIEW OF SYSTEMS:  A comprehensive review of systems was negative except for: Constitutional: positive for fatigue Behavioral/Psych: positive for anxiety   PHYSICAL EXAMINATION: General appearance: alert, cooperative and no distress Head: Normocephalic, without obvious abnormality, atraumatic Neck: no adenopathy, no JVD, supple, symmetrical, trachea midline and thyroid not enlarged, symmetric, no tenderness/mass/nodules Lymph nodes: Cervical, supraclavicular, and axillary nodes normal. Resp: clear to auscultation bilaterally Back: symmetric, no curvature. ROM normal. No CVA tenderness. Cardio: regular rate and rhythm, S1, S2 normal, no murmur, click, rub or gallop GI: soft, non-tender; bowel sounds normal; no masses,  no organomegaly Extremities: extremities normal, atraumatic, no cyanosis or edema  ECOG PERFORMANCE STATUS: 1 - Symptomatic but completely ambulatory  Blood pressure 132/90, pulse 76, temperature 97.7 F (36.5 C), temperature source Tympanic, resp. rate 20, height 5\' 3"  (1.6 m), weight 123 lb 14.4 oz (56.2 kg), SpO2 99 %.  LABORATORY DATA: Lab Results  Component Value Date   WBC 7.0 04/20/2020   HGB 13.1 04/20/2020   HCT 38.5 04/20/2020   MCV 99.0 04/20/2020   PLT 240 04/20/2020      Chemistry      Component Value Date/Time   NA 140 03/29/2020 0813   NA 141 08/01/2017 0835   K 3.1 (L) 03/29/2020 0813   K 3.5  08/01/2017 0835   CL 104 03/29/2020 0813   CO2 26 03/29/2020 0813   CO2 25 08/01/2017 0835   BUN 5 (L) 03/29/2020 0813   BUN 8.0 08/01/2017 0835   CREATININE 0.82 03/29/2020 0813   CREATININE 0.9 08/01/2017 0835      Component Value Date/Time   CALCIUM 9.0 03/29/2020 0813   CALCIUM 9.9 08/01/2017 0835   ALKPHOS 109 03/29/2020 0813   ALKPHOS 142 08/01/2017 0835   AST 14 (L) 03/29/2020 0813   AST 15 08/01/2017 0835   ALT 11 03/29/2020 0813   ALT 19 08/01/2017 0835   BILITOT 0.4 03/29/2020 0813   BILITOT 0.35 08/01/2017 0835       RADIOGRAPHIC STUDIES: No results found.  ASSESSMENT AND PLAN:  This is a very pleasant 59 years old white female with extensive stage small cell lung cancer status post systemic chemotherapy with carbo platinum and etoposide for 6 cycles and the patient rotated her treatment well except for chemotherapy-induced anemia and  requirement for PRBCs and platelet transfusion. She had significant improvement in her disease with the chemotherapy. She also had prophylactic cranial irradiation. She also underwent stereotactic radiotherapy to progressive right upper lobe pulmonary nodule. The patient has been in observation for close to 2 years. Repeat CT scan of the chest, abdomen and pelvis performed recently showed evidence for disease progression in the abdomen. The patient was started on systemic chemotherapy again with carboplatin, etoposide and Tecentriq status post 30 cycles.  Starting from cycle #5 the patient is treated with maintenance single agent Tecentriq. The patient continues to tolerate this treatment well with no concerning adverse effects. I recommended for her to proceed with cycle #31 today as planned. She will come back for follow-up visit in 3 weeks for evaluation before the next cycle of her treatment. For the anxiety and stress she is currently on Valium by her primary care physician. For the persistent pruritus, I will give her a refill of  Atarax 10 mg p.o. 3 times daily as needed. The patient will come back for follow-up visit in 3 weeks for evaluation before the next cycle of her treatment. She was advised to call immediately if she has any concerning symptoms in the interval.  The patient voices understanding of current disease status and treatment options and is in agreement with the current care plan. All questions were answered. The patient knows to call the clinic with any problems, questions or concerns. We can certainly see the patient much sooner if necessary.  Disclaimer: This note was dictated with voice recognition software. Similar sounding words can inadvertently be transcribed and may not be corrected upon review.

## 2020-04-20 NOTE — Patient Instructions (Signed)
Steps to Quit Smoking Smoking tobacco is the leading cause of preventable death. It can affect almost every organ in the body. Smoking puts you and people around you at risk for many serious, long-lasting (chronic) diseases. Quitting smoking can be hard, but it is one of the best things that you can do for your health. It is never too late to quit. How do I get ready to quit? When you decide to quit smoking, make a plan to help you succeed. Before you quit:  Pick a date to quit. Set a date within the next 2 weeks to give you time to prepare.  Write down the reasons why you are quitting. Keep this list in places where you will see it often.  Tell your family, friends, and co-workers that you are quitting. Their support is important.  Talk with your doctor about the choices that may help you quit.  Find out if your health insurance will pay for these treatments.  Know the people, places, things, and activities that make you want to smoke (triggers). Avoid them. What first steps can I take to quit smoking?  Throw away all cigarettes at home, at work, and in your car.  Throw away the things that you use when you smoke, such as ashtrays and lighters.  Clean your car. Make sure to empty the ashtray.  Clean your home, including curtains and carpets. What can I do to help me quit smoking? Talk with your doctor about taking medicines and seeing a counselor at the same time. You are more likely to succeed when you do both.  If you are pregnant or breastfeeding, talk with your doctor about counseling or other ways to quit smoking. Do not take medicine to help you quit smoking unless your doctor tells you to do so. To quit smoking: Quit right away  Quit smoking totally, instead of slowly cutting back on how much you smoke over a period of time.  Go to counseling. You are more likely to quit if you go to counseling sessions regularly. Take medicine You may take medicines to help you quit. Some  medicines need a prescription, and some you can buy over-the-counter. Some medicines may contain a drug called nicotine to replace the nicotine in cigarettes. Medicines may:  Help you to stop having the desire to smoke (cravings).  Help to stop the problems that come when you stop smoking (withdrawal symptoms). Your doctor may ask you to use:  Nicotine patches, gum, or lozenges.  Nicotine inhalers or sprays.  Non-nicotine medicine that is taken by mouth. Find resources Find resources and other ways to help you quit smoking and remain smoke-free after you quit. These resources are most helpful when you use them often. They include:  Online chats with a counselor.  Phone quitlines.  Printed self-help materials.  Support groups or group counseling.  Text messaging programs.  Mobile phone apps. Use apps on your mobile phone or tablet that can help you stick to your quit plan. There are many free apps for mobile phones and tablets as well as websites. Examples include Quit Guide from the CDC and smokefree.gov  What things can I do to make it easier to quit?   Talk to your family and friends. Ask them to support and encourage you.  Call a phone quitline (1-800-QUIT-NOW), reach out to support groups, or work with a counselor.  Ask people who smoke to not smoke around you.  Avoid places that make you want to smoke,   such as: ? Bars. ? Parties. ? Smoke-break areas at work.  Spend time with people who do not smoke.  Lower the stress in your life. Stress can make you want to smoke. Try these things to help your stress: ? Getting regular exercise. ? Doing deep-breathing exercises. ? Doing yoga. ? Meditating. ? Doing a body scan. To do this, close your eyes, focus on one area of your body at a time from head to toe. Notice which parts of your body are tense. Try to relax the muscles in those areas. How will I feel when I quit smoking? Day 1 to 3 weeks Within the first 24 hours,  you may start to have some problems that come from quitting tobacco. These problems are very bad 2-3 days after you quit, but they do not often last for more than 2-3 weeks. You may get these symptoms:  Mood swings.  Feeling restless, nervous, angry, or annoyed.  Trouble concentrating.  Dizziness.  Strong desire for high-sugar foods and nicotine.  Weight gain.  Trouble pooping (constipation).  Feeling like you may vomit (nausea).  Coughing or a sore throat.  Changes in how the medicines that you take for other issues work in your body.  Depression.  Trouble sleeping (insomnia). Week 3 and afterward After the first 2-3 weeks of quitting, you may start to notice more positive results, such as:  Better sense of smell and taste.  Less coughing and sore throat.  Slower heart rate.  Lower blood pressure.  Clearer skin.  Better breathing.  Fewer sick days. Quitting smoking can be hard. Do not give up if you fail the first time. Some people need to try a few times before they succeed. Do your best to stick to your quit plan, and talk with your doctor if you have any questions or concerns. Summary  Smoking tobacco is the leading cause of preventable death. Quitting smoking can be hard, but it is one of the best things that you can do for your health.  When you decide to quit smoking, make a plan to help you succeed.  Quit smoking right away, not slowly over a period of time.  When you start quitting, seek help from your doctor, family, or friends. This information is not intended to replace advice given to you by your health care provider. Make sure you discuss any questions you have with your health care provider. Document Revised: 05/14/2019 Document Reviewed: 11/07/2018 Elsevier Patient Education  2020 [employer]  

## 2020-04-20 NOTE — Patient Instructions (Signed)

## 2020-04-20 NOTE — Patient Instructions (Signed)
[facility] Discharge Instructions for Patients Receiving Chemotherapy  Today you received the following chemotherapy agents: Tecentriq  To help prevent nausea and vomiting after your treatment, we encourage you to take your nausea medication as directed.   If you develop nausea and vomiting that is not controlled by your nausea medication, call the clinic.   BELOW ARE SYMPTOMS THAT SHOULD BE REPORTED IMMEDIATELY:  *FEVER GREATER THAN 100.5 F  *CHILLS WITH OR WITHOUT FEVER  NAUSEA AND VOMITING THAT IS NOT CONTROLLED WITH YOUR NAUSEA MEDICATION  *UNUSUAL SHORTNESS OF BREATH  *UNUSUAL BRUISING OR BLEEDING  TENDERNESS IN MOUTH AND THROAT WITH OR WITHOUT PRESENCE OF ULCERS  *URINARY PROBLEMS  *BOWEL PROBLEMS  UNUSUAL RASH Items with * indicate a potential emergency and should be followed up as soon as possible.  Feel free to call the clinic should you have any questions or concerns. The clinic phone number is [phone].  Please show the CHEMO ALERT CARD at check-in to the Emergency Department and triage nurse.   

## 2020-05-03 NOTE — Progress Notes (Signed)
[facility] OFFICE PROGRESS NOTE  [name], MD [address] [place] [address]  DIAGNOSIS: Extensive stage (T2a, N3, [name], large right anterior mediastinal and supraclavicular lymphadenopathy as well as pancreatic and splenic metastasis diagnosed in September 2017. The patient had disease progression in October 2019.  PRIOR THERAPY: 1) Systemic chemotherapy was carboplatin for AUC of 5 on day 1 and etoposide 100 MG/M2 on days 1, 2 and 3 with Neulasta support. Status post 6 cycles with significant response of her disease. 2) Prophylactic cranial irradiation under the care of Dr. Sondra Come on 12/02/2016. 3) stereotactic radiotherapy to the recurrent right upper lobe pulmonary nodule under the care of Dr. Sondra Come completed November 10, 2017. 4)Retreatment with systemic chemotherapy with carboplatin for AUC of 5 on day 1 and etoposide 100 mg/M2 on days 1, 2 and 3 as well as Tecentriq (Atezolizumab) 1200 mg IV every 3 weeks with Neulasta support. First dose June 22, 2018 for disease recurrence. Status post 5 cycles. Starting from cycle #2 her dose of carboplatin will be reduced to AUC of 4 and etoposide 80 mg/M2 on days 1, 2 and 3 in addition to the regular dose of Tecentriq  CURRENT THERAPY: Maintenance treatment with single agent Tecentriq 1200 mg IV every 3 weeks. Status post31cycles  INTERVAL HISTORY: Debra Kidd 59 y.o. female returns to the clinic for a follow up visit. The patient is feeling well today without any concerning complaints.She received the second dose of her COVID 10 vaccine last week. She felt unwell for 2 days but is feeling better now. The patient continues to tolerate treatment with immunotherapy with Tecentriqwell without any adverse effects except itching for which she uses atarax. Her itching has been better recently and she has not needed to use atarax for about 1 month. Denies any  fever, chills, night sweats, or weight loss.She actually has gained weight since her last treatment.Denies any chest pain or hemoptysisbut endorses her baseline coughand dyspnea on exertion. She reports some heartburn for which she uses tums. Denies any nausea, vomiting, diarrhea, or constipation. Denies any visual changes. She gets headaches every once in awhile and uses tylenol if needed. The patient is here today for evaluation prior to starting cycle #32.  MEDICAL HISTORY: Past Medical History:  Diagnosis Date  . 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 Blood type, Rh positive   . Cancer ([place])   . Chronic pain syndrome   . Depression 08/07/2016  . Encounter for antineoplastic chemotherapy 07/17/2016  . History of external beam radiation therapy 11/19/16-12/02/16   brain 25 Gy in 10 fractions  . Hyperlipidemia   . Hypertension   . Hypertension 08/07/2016  . Osteoporosis   . Small cell lung cancer ([place]) dx'd 05/2016    ALLERGIES:  is allergic to codeine, motrin [ibuprofen], thiazide-type diuretics, and vicodin [hydrocodone-acetaminophen].  MEDICATIONS:  Current Outpatient Medications  Medication Sig Dispense Refill  . clopidogrel (PLAVIX) 75 MG tablet Take 1 tablet (75 mg total) by mouth daily. 30 tablet 0  . diazepam (VALIUM) 5 MG tablet Take 5 mg by mouth 3 (three) times daily as needed for anxiety.   2  . hydrOXYzine (ATARAX/VISTARIL) 10 MG tablet Take 1 tablet (10 mg total) by mouth 3 (three) times daily as needed. 90 tablet 1  . lidocaine-prilocaine (EMLA) cream Apply 1 application topically as needed. 30 g 0  . oxyCODONE-acetaminophen (  PERCOCET) 5-325 MG tablet Take 1 tablet by mouth every 6 (six) hours as needed for severe pain. 30 tablet 0  . polyethylene glycol (MIRALAX / GLYCOLAX) packet Take 17 g by mouth daily. 14 each 0  . potassium chloride 20 MEQ/15ML (10%) SOLN Take 15 mLs (20 mEq total) by mouth daily. 100 mL 0  . traMADol (ULTRAM) 50 MG tablet Take 50 mg  by mouth every 6 (six) hours as needed for moderate pain.     . Vitamin D, Ergocalciferol, (DRISDOL) 1.25 MG (50000 UT) CAPS capsule Take 1 capsule (50,000 Units total) by mouth every 7 (seven) days. 5 capsule 0  . prochlorperazine (COMPAZINE) 10 MG tablet Take 1 tablet (10 mg total) by mouth every 6 (six) hours as needed for nausea or vomiting. (Patient not taking: Reported on 03/09/2020) 30 tablet 0   No current facility-administered medications for this visit.    SURGICAL HISTORY:  Past Surgical History:  Procedure Laterality Date  . ABDOMINAL HYSTERECTOMY  1981  . APPENDECTOMY     at age 56  . EYE SURGERY     left  . IR GENERIC HISTORICAL  06/03/2016   IR FLUORO GUIDE PORT INSERTION RIGHT 06/03/2016 [facility]  . IR GENERIC HISTORICAL  06/03/2016   IR US GUIDE VASC ACCESS RIGHT 06/03/2016 [facility]  . SKIN CANCER EXCISION     basal cell carcinoma L side of face    REVIEW OF SYSTEMS:   Review of Systems  Constitutional: Negative for appetite change, chills, fatigue, fever and unexpected weight change.  HENT: Negative for mouth sores, nosebleeds, sore throat and trouble swallowing.  Eyes: Negative for eye problems and icterus.  Respiratory:Positive for baseline coughand shortness of breath with exertion.Negative for hemoptysis and wheezing.  Cardiovascular: Negative for chest pain and leg swelling.  Gastrointestinal: Negative for abdominal pain, constipation, diarrhea, nausea and vomiting.  Genitourinary: Negative for bladder incontinence, difficulty urinating, dysuria, frequency and hematuria.  Musculoskeletal: Negative for back pain, gait problem, neck pain and neck stiffness.  Skin:Positive for itching(improved from prior).Negative for rash.  Neurological:Positive for limping secondary to history of stroke.Negative for dizziness, extremity weakness, headaches, light-headedness and seizures.  Hematological: Negative for adenopathy. Does not bruise/bleed easily.   Psychiatric/Behavioral: Negative for confusion, depression and sleep disturbance. The patient is not nervous/anxious.    PHYSICAL EXAMINATION:  Blood pressure (!) 123/93, pulse 78, temperature (!) 95.9 F (35.5 C), temperature source Tympanic, resp. rate 18, height 5\' 3"  (1.6 m), weight 125 lb 1.6 oz (56.7 kg), SpO2 99 %.  ECOG PERFORMANCE STATUS: 1 - Symptomatic but completely ambulatory  Physical Exam  Constitutional: Oriented to person, place, and time andchronically ill appearing femaleand in no distress.  HENT:  Head: Normocephalic and atraumatic.  Mouth/Throat: Oropharynx is clear and moist. No oropharyngeal exudate.  Eyes: Conjunctivae are normal. Right eye exhibits no discharge. Left eye exhibits no discharge. No scleral icterus.  Neck: Normal range of motion. Neck supple.  Cardiovascular: Normal rate, regular rhythm, normal heart sounds and intact distal pulses.  Pulmonary/Chest: Effort normal and breath sounds normal. No respiratory distress. No wheezes. No rales.  Abdominal: Soft. Bowel sounds are normal. Exhibits no distension and no mass. There is no tenderness.  Musculoskeletal: Normal range of motion. Exhibits no edema.  Lymphadenopathy:  No cervical adenopathy.  Neurological: Alert and oriented to person, place, and time. Exhibits normal muscle tone. Positive for limping due to history of stroke. Skin: Skin is warm and dry. No rash noted. Not diaphoretic. No erythema.  No pallor.  Psychiatric: Mood, memory and judgment normal.  Vitals reviewed.  LABORATORY DATA: Lab Results  Component Value Date   WBC 7.8 05/10/2020   HGB 12.5 05/10/2020   HCT 36.5 05/10/2020   MCV 99.5 05/10/2020   PLT 261 05/10/2020      Chemistry      Component Value Date/Time   NA 138 05/10/2020 0840   NA 141 08/01/2017 0835   K 3.2 (L) 05/10/2020 0840   K 3.5 08/01/2017 0835   CL 104 05/10/2020 0840   CO2 26 05/10/2020 0840   CO2 25 08/01/2017 0835   BUN 7 (L) 05/10/2020  0840   BUN 8.0 08/01/2017 0835   CREATININE 0.78 05/10/2020 0840   CREATININE 0.9 08/01/2017 0835      Component Value Date/Time   CALCIUM 8.7 (L) 05/10/2020 0840   CALCIUM 9.9 08/01/2017 0835   ALKPHOS 107 05/10/2020 0840   ALKPHOS 142 08/01/2017 0835   AST 17 05/10/2020 0840   AST 15 08/01/2017 0835   ALT 22 05/10/2020 0840   ALT 19 08/01/2017 0835   BILITOT 0.4 05/10/2020 0840   BILITOT 0.35 08/01/2017 0835       RADIOGRAPHIC STUDIES:  No results found.   ASSESSMENT/PLAN:  This is a very pleasant 59 year old Caucasian female with extensive stage small cell lung cancer. She presented with a right upper lobe lung mass, large right anterior mediastinal and supraclavicular lymphadenopathy as well as a pancreatic andsplenic metastasis. She was diagnosed in September 2017.  The patient underwent systemic chemotherapy with carboplatin and etoposide. She is status post 6 cycles. She tolerated treatment well except for chemotherapy-induced anemia which required pRBCs and platelet transfusions. She had a significant improvement of her disease with chemotherapy.The patientthenunderwent prophylactic cranial irradiation. She then underwent stereotactic radiotherapy to the right upper lobe and pulmonary nodule.  She had been on observation for 2 years before showing evidence of disease progression.   She recently hadbeen started on systemic chemotherapy with carboplatin, etoposide, and Tecentriq. Starting from cycle #5, she has been on maintenance single agent Tecentriq. She has been tolerating treatment well.She is status post31cycles of single agent Tecentriq.  Labs were reviewed. Recommend that she proceed with cycle #32 today as scheduled.   I will arrange for a restaging CT scan prior to her next cycle of treatment.   We will see her back for a follow up visit in 3 weeks for evaluation and to review her scan results before starting cycle #33.   She will continue to  take atarax for itchingif needed.   Her potassium is still a little low today at 3.2. I have sent a refill for potassium 20 meq daily for the next 5-7 days or so to her pharmacy.   The patient was advised to call immediately if she has any concerning symptoms in the interval. The patient voices understanding of current disease status and treatment options and is in agreement with the current care plan. All questions were answered. The patient knows to call the clinic with any problems, questions or concerns. We can certainly see the patient much sooner if necessary  Orders Placed This Encounter  Procedures  . CT Chest W Contrast    Standing Status:   Future    Standing Expiration Date:   05/10/2021    Order Specific Question:   If indicated for the ordered procedure, I authorize the administration of contrast media per Radiology protocol    Answer:   Yes  Order Specific Question:   Preferred imaging location?    Answer:   [facility]    Order Specific Question:   Radiology Contrast Protocol - do NOT remove file path    Answer:   [email or web address]  . CT Abdomen Pelvis W Contrast    Standing Status:   Future    Standing Expiration Date:   05/10/2021    Order Specific Question:   If indicated for the ordered procedure, I authorize the administration of contrast media per Radiology protocol    Answer:   Yes    Order Specific Question:   Preferred imaging location?    Answer:   [facility]    Order Specific Question:   Is Oral Contrast requested for this exam?    Answer:   Yes, Per Radiology protocol    Order Specific Question:   Radiology Contrast Protocol - do NOT remove file path    Answer:   [email or web address]     Galaxy Borden L Britton Perkinson, PA-C 05/10/20

## 2020-05-10 ENCOUNTER — Inpatient Hospital Stay (HOSPITAL_BASED_OUTPATIENT_CLINIC_OR_DEPARTMENT_OTHER): Payer: Medicare Other | Admitting: Physician Assistant

## 2020-05-10 ENCOUNTER — Inpatient Hospital Stay: Payer: Medicare Other

## 2020-05-10 ENCOUNTER — Inpatient Hospital Stay: Payer: Medicare Other | Attending: Internal Medicine

## 2020-05-10 ENCOUNTER — Other Ambulatory Visit: Payer: Self-pay

## 2020-05-10 VITALS — BP 123/93 | HR 78 | Temp 95.9°F | Resp 18 | Ht 63.0 in | Wt 125.1 lb

## 2020-05-10 DIAGNOSIS — Z85828 Personal history of other malignant neoplasm of skin: Secondary | ICD-10-CM | POA: Diagnosis not present

## 2020-05-10 DIAGNOSIS — Z23 Encounter for immunization: Secondary | ICD-10-CM | POA: Diagnosis not present

## 2020-05-10 DIAGNOSIS — C3411 Malignant neoplasm of upper lobe, right bronchus or lung: Secondary | ICD-10-CM | POA: Insufficient documentation

## 2020-05-10 DIAGNOSIS — I10 Essential (primary) hypertension: Secondary | ICD-10-CM | POA: Diagnosis not present

## 2020-05-10 DIAGNOSIS — C3491 Malignant neoplasm of unspecified part of right bronchus or lung: Secondary | ICD-10-CM

## 2020-05-10 DIAGNOSIS — E785 Hyperlipidemia, unspecified: Secondary | ICD-10-CM | POA: Diagnosis not present

## 2020-05-10 DIAGNOSIS — R5383 Other fatigue: Secondary | ICD-10-CM

## 2020-05-10 DIAGNOSIS — Z9221 Personal history of antineoplastic chemotherapy: Secondary | ICD-10-CM | POA: Insufficient documentation

## 2020-05-10 DIAGNOSIS — Z5112 Encounter for antineoplastic immunotherapy: Secondary | ICD-10-CM | POA: Diagnosis not present

## 2020-05-10 DIAGNOSIS — Z923 Personal history of irradiation: Secondary | ICD-10-CM | POA: Diagnosis not present

## 2020-05-10 DIAGNOSIS — E876 Hypokalemia: Secondary | ICD-10-CM

## 2020-05-10 DIAGNOSIS — M810 Age-related osteoporosis without current pathological fracture: Secondary | ICD-10-CM | POA: Diagnosis not present

## 2020-05-10 LAB — CBC WITH DIFFERENTIAL (CANCER CENTER ONLY)
Abs Immature Granulocytes: 0.02 10*3/uL (ref 0.00–0.07)
Basophils Absolute: 0 10*3/uL (ref 0.0–0.1)
Basophils Relative: 1 %
Eosinophils Absolute: 0.2 10*3/uL (ref 0.0–0.5)
Eosinophils Relative: 2 %
HCT: 36.5 % (ref 36.0–46.0)
Hemoglobin: 12.5 g/dL (ref 12.0–15.0)
Immature Granulocytes: 0 %
Lymphocytes Relative: 34 %
Lymphs Abs: 2.6 10*3/uL (ref 0.7–4.0)
MCH: 34.1 pg — ABNORMAL HIGH (ref 26.0–34.0)
MCHC: 34.2 g/dL (ref 30.0–36.0)
MCV: 99.5 fL (ref 80.0–100.0)
Monocytes Absolute: 0.4 10*3/uL (ref 0.1–1.0)
Monocytes Relative: 6 %
Neutro Abs: 4.5 10*3/uL (ref 1.7–7.7)
Neutrophils Relative %: 57 %
Platelet Count: 261 10*3/uL (ref 150–400)
RBC: 3.67 MIL/uL — ABNORMAL LOW (ref 3.87–5.11)
RDW: 12.4 % (ref 11.5–15.5)
WBC Count: 7.8 10*3/uL (ref 4.0–10.5)
nRBC: 0 % (ref 0.0–0.2)

## 2020-05-10 LAB — CMP (CANCER CENTER ONLY)
ALT: 22 U/L (ref 0–44)
AST: 17 U/L (ref 15–41)
Albumin: 3.5 g/dL (ref 3.5–5.0)
Alkaline Phosphatase: 107 U/L (ref 38–126)
Anion gap: 8 (ref 5–15)
BUN: 7 mg/dL — ABNORMAL LOW (ref 8–23)
CO2: 26 mmol/L (ref 22–32)
Calcium: 8.7 mg/dL — ABNORMAL LOW (ref 8.9–10.3)
Chloride: 104 mmol/L (ref 98–111)
Creatinine: 0.78 mg/dL (ref 0.44–1.00)
GFR, Est AFR Am: >60 mL/min (ref >60)
GFR, Estimated: >60 mL/min (ref >60)
Glucose, Bld: 102 mg/dL — ABNORMAL HIGH (ref 70–99)
Potassium: 3.2 mmol/L — ABNORMAL LOW (ref 3.5–5.1)
Sodium: 138 mmol/L (ref 135–145)
Total Bilirubin: 0.4 mg/dL (ref 0.3–1.2)
Total Protein: 6.8 g/dL (ref 6.5–8.1)

## 2020-05-10 LAB — TSH: TSH: 2.022 u[IU]/mL (ref 0.308–3.960)

## 2020-05-10 MED ORDER — SODIUM CHLORIDE 0.9 % IV SOLN
Freq: Once | INTRAVENOUS | Status: AC
  Filled 2020-05-10: qty 250

## 2020-05-10 MED ORDER — SODIUM CHLORIDE 0.9 % IV SOLN
1200.0000 mg | Freq: Once | INTRAVENOUS | Status: AC
  Administered 2020-05-10: 1200 mg via INTRAVENOUS
  Filled 2020-05-10: qty 20

## 2020-05-10 MED ORDER — POTASSIUM CHLORIDE 20 MEQ/15ML (10%) PO SOLN: 20.0000 meq | Freq: Every day | ORAL | 0 refills | Status: DC

## 2020-05-10 MED ORDER — SODIUM CHLORIDE 0.9% FLUSH
10.0000 mL | INTRAVENOUS | Status: DC | PRN
  Administered 2020-05-10: 10 mL
  Filled 2020-05-10: qty 10

## 2020-05-10 MED ORDER — HEPARIN SOD (PORK) LOCK FLUSH 100 UNIT/ML IV SOLN
500.0000 [IU] | Freq: Once | INTRAVENOUS | Status: AC | PRN
  Administered 2020-05-10: 500 [IU]
  Filled 2020-05-10: qty 5

## 2020-05-10 NOTE — Patient Instructions (Addendum)
[facility] Discharge Instructions for Patients Receiving Chemotherapy  Today you received the following monoclonal antibody agent Atezolizumab (TECENTRIQ).  To help prevent nausea and vomiting after your treatment, we encourage you to take your nausea medication as prescribed.   If you develop nausea and vomiting that is not controlled by your nausea medication, call the clinic.   BELOW ARE SYMPTOMS THAT SHOULD BE REPORTED IMMEDIATELY:  *FEVER GREATER THAN 100.5 F  *CHILLS WITH OR WITHOUT FEVER  NAUSEA AND VOMITING THAT IS NOT CONTROLLED WITH YOUR NAUSEA MEDICATION  *UNUSUAL SHORTNESS OF BREATH  *UNUSUAL BRUISING OR BLEEDING  TENDERNESS IN MOUTH AND THROAT WITH OR WITHOUT PRESENCE OF ULCERS  *URINARY PROBLEMS  *BOWEL PROBLEMS  UNUSUAL RASH Items with * indicate a potential emergency and should be followed up as soon as possible.  Feel free to call the clinic should you have any questions or concerns. The clinic phone number is [phone].  Please show the [place] at check-in to the Emergency Department and triage nurse.

## 2020-05-29 ENCOUNTER — Ambulatory Visit (HOSPITAL_COMMUNITY)
Admission: RE | Admit: 2020-05-29 | Discharge: 2020-05-29 | Disposition: A | Discharge status: Home / Self Care | Payer: Medicare Other | Source: Ambulatory Visit | Attending: Physician Assistant | Admitting: Physician Assistant

## 2020-05-29 ENCOUNTER — Other Ambulatory Visit: Payer: Self-pay

## 2020-05-29 ENCOUNTER — Encounter (HOSPITAL_COMMUNITY): Payer: Self-pay

## 2020-05-29 DIAGNOSIS — C3491 Malignant neoplasm of unspecified part of right bronchus or lung: Secondary | ICD-10-CM | POA: Insufficient documentation

## 2020-05-29 MED ORDER — HEPARIN SOD (PORK) LOCK FLUSH 100 UNIT/ML IV SOLN
INTRAVENOUS | Status: AC
  Administered 2020-05-29: 500 [IU] via INTRAVENOUS
  Filled 2020-05-29: qty 5

## 2020-05-29 MED ORDER — IOHEXOL 300 MG/ML  SOLN
100.0000 mL | Freq: Once | INTRAMUSCULAR | Status: AC | PRN
  Administered 2020-05-29: 100 mL via INTRAVENOUS

## 2020-05-29 MED ORDER — HEPARIN SOD (PORK) LOCK FLUSH 100 UNIT/ML IV SOLN: 500.0000 [IU] | Freq: Once | INTRAVENOUS | Status: AC

## 2020-05-29 NOTE — Progress Notes (Signed)
[place] OFFICE PROGRESS NOTE  [name], MD [address] [place] [address]  DIAGNOSIS: Extensive stage (T2a, N3, [name], large right anterior mediastinal and supraclavicular lymphadenopathy as well as pancreatic and splenic metastasis diagnosed in September 2017. The patient had disease progression in October 2019.  PRIOR THERAPY: 1) Systemic chemotherapy was carboplatin for AUC of 5 on day 1 and etoposide 100 MG/M2 on days 1, 2 and 3 with Neulasta support. Status post 6 cycles with significant response of her disease. 2) Prophylactic cranial irradiation under the care of Dr. Sondra Come on 12/02/2016. 3) stereotactic radiotherapy to the recurrent right upper lobe pulmonary nodule under the care of Dr. Sondra Come completed November 10, 2017. 4)Retreatment with systemic chemotherapy with carboplatin for AUC of 5 on day 1 and etoposide 100 mg/M2 on days 1, 2 and 3 as well as Tecentriq (Atezolizumab) 1200 mg IV every 3 weeks with Neulasta support. First dose June 22, 2018 for disease recurrence. Status post 5 cycles. Starting from cycle #2 her dose of carboplatin will be reduced to AUC of 4 and etoposide 80 mg/M2 on days 1, 2 and 3 in addition to the regular dose of Tecentriq  CURRENT THERAPY: Maintenance treatment with single agent Tecentriq 1200 mg IV every 3 weeks. Status post32cycles  INTERVAL HISTORY: Debra Kidd 59 y.o. female returns to the clinic for a follow up visit. The patient is feeling well today without any concerning complaints. The patient continues to tolerate treatment with immunotherapy with Tecentriqwell without any adverse effects except itching for which she uses atarax.Her itching has been better recently though. Otherwise, she recently had a CT scan and had some diarrhea from drinking the contrast. She states this normally resolves on its own so she has not taken imodium. Denies any  fever, chills, night sweats, or weight loss.She actually has gained weight since her last treatment.Denies any chest pain or hemoptysisbut endorses her baseline coughand dyspnea on exertion. Denies any vomiting or constipation. She reports she had nausea last night due to taking her tramadol without enough food on her stomach. Denies any visual changes. She gets headaches every once in awhile and uses tylenol if needed. The patient recently had a restaging CT scan performed. She is interested in a flu shot today.The patient is here today for evaluationand to review her scan results prior to starting cycle #33.   MEDICAL HISTORY: Past Medical History:  Diagnosis Date  . 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 Blood type, Rh positive   . Cancer ([place])   . Chronic pain syndrome   . Depression 08/07/2016  . Encounter for antineoplastic chemotherapy 07/17/2016  . History of external beam radiation therapy 11/19/16-12/02/16   brain 25 Gy in 10 fractions  . Hyperlipidemia   . Hypertension   . Hypertension 08/07/2016  . Osteoporosis   . Small cell lung cancer ([place]) dx'd 05/2016    ALLERGIES:  is allergic to codeine, motrin [ibuprofen], thiazide-type diuretics, and vicodin [hydrocodone-acetaminophen].  MEDICATIONS:  Current Outpatient Medications  Medication Sig Dispense Refill  . clopidogrel (PLAVIX) 75 MG tablet Take 1 tablet (75 mg total) by mouth daily. 30 tablet 0  . diazepam (VALIUM) 5 MG tablet Take 5 mg by mouth 3 (three) times daily as needed for anxiety.   2  . hydrOXYzine (ATARAX/VISTARIL) 10 MG tablet Take 1 tablet (10 mg total) by mouth 3 (three)  times daily as needed. 90 tablet 1  . lidocaine-prilocaine (EMLA) cream Apply 1 application topically as needed. 30 g 0  . oxyCODONE-acetaminophen (PERCOCET) 5-325 MG tablet Take 1 tablet by mouth every 6 (six) hours as needed for severe pain. 30 tablet 0  . polyethylene glycol (MIRALAX / GLYCOLAX) packet Take 17 g by mouth  daily. 14 each 0  . potassium chloride 20 MEQ/15ML (10%) SOLN Take 15 mLs (20 mEq total) by mouth daily. 150 mL 0  . prochlorperazine (COMPAZINE) 10 MG tablet Take 1 tablet (10 mg total) by mouth every 6 (six) hours as needed for nausea or vomiting. (Patient not taking: Reported on 03/09/2020) 30 tablet 0  . traMADol (ULTRAM) 50 MG tablet Take 50 mg by mouth every 6 (six) hours as needed for moderate pain.     . Vitamin D, Ergocalciferol, (DRISDOL) 1.25 MG (50000 UT) CAPS capsule Take 1 capsule (50,000 Units total) by mouth every 7 (seven) days. 5 capsule 0   No current facility-administered medications for this visit.    SURGICAL HISTORY:  Past Surgical History:  Procedure Laterality Date  . ABDOMINAL HYSTERECTOMY  1981  . APPENDECTOMY     at age 45  . EYE SURGERY     left  . IR GENERIC HISTORICAL  06/03/2016   IR FLUORO GUIDE PORT INSERTION RIGHT 06/03/2016 [facility]  . IR GENERIC HISTORICAL  06/03/2016   IR US GUIDE VASC ACCESS RIGHT 06/03/2016 [facility]  . SKIN CANCER EXCISION     basal cell carcinoma L side of face    REVIEW OF SYSTEMS:   Review of Systems  Constitutional: Negative for appetite change, chills, fatigue, fever and unexpected weight change.  HENT: Negative for mouth sores, nosebleeds, sore throat and trouble swallowing.  Eyes: Negative for eye problems and icterus.  Respiratory:Positive for baseline coughand shortness of breath with exertion.Negative for hemoptysis and wheezing.  Cardiovascular: Negative for chest pain and leg swelling.  Gastrointestinal: Positive for diarrhea (improving). Negative for abdominal pain, constipation,  nausea and vomiting.  Genitourinary: Negative for bladder incontinence, difficulty urinating, dysuria, frequency and hematuria.  Musculoskeletal: Negative for back pain, gait problem, neck pain and neck stiffness.  Skin:Positive for itching(improved from prior).Negative for rash.  Neurological:Positive for limping  secondary to history of stroke.Negative for dizziness, extremity weakness, headaches, [name], depression and sleep disturbance. The patient is not nervous/anxious.   PHYSICAL EXAMINATION:  Blood pressure (!) 136/105, pulse 93, temperature 97.8 F (36.6 C), temperature source Tympanic, resp. rate 18, height 5\' 3"  (1.6 m), weight 124 lb 14.4 oz (56.7 kg), SpO2 99 %.  ECOG PERFORMANCE STATUS: 1 - Symptomatic but completely ambulatory  Physical Exam  Constitutional: Oriented to person, place, and time andchronically ill appearing femaleand in no distress.  HENT:  Head: Normocephalic and atraumatic.  Mouth/Throat: Oropharynx is clear and moist. No oropharyngeal exudate.  Eyes: Conjunctivae are normal. Right eye exhibits no discharge. Left eye exhibits no discharge. No scleral icterus.  Neck: Normal range of motion. Neck supple.  Cardiovascular: Normal rate, regular rhythm, normal heart sounds and intact distal pulses.  Pulmonary/Chest: Effort normal and breath sounds normal. No respiratory distress. No wheezes. No rales.  Abdominal: Soft. Bowel sounds are normal. Exhibits no distension and no mass. There is no tenderness.  Musculoskeletal: Normal range of motion. Exhibits no edema.  Lymphadenopathy:  No cervical adenopathy.  Neurological: Alert and oriented to person, place, and  time. Exhibits normal muscle tone. Positive for limping due to history of stroke. Skin: Skin is warm and dry. No rash noted. Not diaphoretic. No erythema. No pallor.  Psychiatric: Mood, memory and judgment normal.  Vitals reviewed.  LABORATORY DATA: Lab Results  Component Value Date   WBC 7.7 05/31/2020   HGB 12.3 05/31/2020   HCT 35.8 (L) 05/31/2020   MCV 102.3 (H) 05/31/2020   PLT 244 05/31/2020      Chemistry      Component Value Date/Time   NA 139  05/31/2020 0844   NA 141 08/01/2017 0835   K 3.0 (LL) 05/31/2020 0844   K 3.5 08/01/2017 0835   CL 103 05/31/2020 0844   CO2 28 05/31/2020 0844   CO2 25 08/01/2017 0835   BUN 5 (L) 05/31/2020 0844   BUN 8.0 08/01/2017 0835   CREATININE 0.91 05/31/2020 0844   CREATININE 0.9 08/01/2017 0835      Component Value Date/Time   CALCIUM 8.7 (L) 05/31/2020 0844   CALCIUM 9.9 08/01/2017 0835   ALKPHOS 109 05/31/2020 0844   ALKPHOS 142 08/01/2017 0835   AST 13 (L) 05/31/2020 0844   AST 15 08/01/2017 0835   ALT 13 05/31/2020 0844   ALT 19 08/01/2017 0835   BILITOT 0.5 05/31/2020 0844   BILITOT 0.35 08/01/2017 0835       RADIOGRAPHIC STUDIES:  CT Chest W Contrast  Result Date: 05/29/2020 CLINICAL DATA:  Lung cancer.  Metastatic disease. EXAM: CT CHEST, ABDOMEN, AND PELVIS WITH CONTRAST TECHNIQUE: Multidetector CT imaging of the chest, abdomen and pelvis was performed following the standard protocol during bolus administration of intravenous contrast. CONTRAST:  181mL OMNIPAQUE IOHEXOL 300 MG/ML  SOLN COMPARISON:  03/07/2020 FINDINGS: CT CHEST FINDINGS Cardiovascular: The heart size is normal. No substantial pericardial effusion. Coronary artery calcification is evident. Atherosclerotic calcification is noted in the wall of the thoracic aorta. Right Port-A-Cath tip is positioned at the SVC/RA junction. Mediastinum/Nodes: No mediastinal lymphadenopathy. There is no hilar lymphadenopathy. The esophagus has normal imaging features. There is no axillary lymphadenopathy. Lungs/Pleura: Stable appearance of post treatment scarring in the anterior right apex. No suspicious pulmonary nodule or mass. No focal airspace consolidation. Insert no effusions Musculoskeletal: No worrisome lytic or sclerotic osseous abnormality. CT ABDOMEN PELVIS FINDINGS Hepatobiliary: No suspicious focal abnormality within the liver parenchyma. Small area of low attenuation in the anterior liver, adjacent to the falciform ligament,  is in a characteristic location for focal fatty deposition. There is no evidence for gallstones, gallbladder wall thickening, or pericholecystic fluid. No intrahepatic or extrahepatic biliary dilation. Pancreas: No focal mass lesion. No dilatation of the main duct. No intraparenchymal cyst. No peripancreatic edema. Spleen: No splenomegaly. No focal mass lesion. Adrenals/Urinary Tract: No adrenal nodule or mass. Kidneys unremarkable. No evidence for hydroureter. The urinary bladder appears normal for the degree of distention. Stomach/Bowel: Stomach is unremarkable. No gastric wall thickening. No evidence of outlet obstruction. Duodenum is normally positioned as is the ligament of Treitz. No small bowel wall thickening. No small bowel dilatation. The terminal ileum is normal. No gross colonic mass. No colonic wall thickening. Vascular/Lymphatic: There is abdominal aortic atherosclerosis without aneurysm. There is no gastrohepatic or hepatoduodenal ligament lymphadenopathy. No retroperitoneal or mesenteric lymphadenopathy. No pelvic sidewall lymphadenopathy. Reproductive: The uterus is surgically absent. There is no adnexal mass. Other: No intraperitoneal free fluid. Musculoskeletal: No worrisome lytic or sclerotic osseous abnormality.Stable multilevel midthoracic compression fracture. Superior endplate compression deformity at L2 is unchanged. IMPRESSION: 1. Stable exam. No new  or progressive findings to suggest recurrent or metastatic disease in the chest, abdomen, or pelvis. 2. Stable appearance of post treatment scarring in the anterior right apex. 3. Aortic Atherosclerosis (ICD10-I70.0). Electronically Signed   By: Misty Stanley M.D.   On: 05/29/2020 10:14   CT Abdomen Pelvis W Contrast  Result Date: 05/29/2020 CLINICAL DATA:  Lung cancer.  Metastatic disease. EXAM: CT CHEST, ABDOMEN, AND PELVIS WITH CONTRAST TECHNIQUE: Multidetector CT imaging of the chest, abdomen and pelvis was performed following the  standard protocol during bolus administration of intravenous contrast. CONTRAST:  182mL OMNIPAQUE IOHEXOL 300 MG/ML  SOLN COMPARISON:  03/07/2020 FINDINGS: CT CHEST FINDINGS Cardiovascular: The heart size is normal. No substantial pericardial effusion. Coronary artery calcification is evident. Atherosclerotic calcification is noted in the wall of the thoracic aorta. Right Port-A-Cath tip is positioned at the SVC/RA junction. Mediastinum/Nodes: No mediastinal lymphadenopathy. There is no hilar lymphadenopathy. The esophagus has normal imaging features. There is no axillary lymphadenopathy. Lungs/Pleura: Stable appearance of post treatment scarring in the anterior right apex. No suspicious pulmonary nodule or mass. No focal airspace consolidation. Insert no effusions Musculoskeletal: No worrisome lytic or sclerotic osseous abnormality. CT ABDOMEN PELVIS FINDINGS Hepatobiliary: No suspicious focal abnormality within the liver parenchyma. Small area of low attenuation in the anterior liver, adjacent to the falciform ligament, is in a characteristic location for focal fatty deposition. There is no evidence for gallstones, gallbladder wall thickening, or pericholecystic fluid. No intrahepatic or extrahepatic biliary dilation. Pancreas: No focal mass lesion. No dilatation of the main duct. No intraparenchymal cyst. No peripancreatic edema. Spleen: No splenomegaly. No focal mass lesion. Adrenals/Urinary Tract: No adrenal nodule or mass. Kidneys unremarkable. No evidence for hydroureter. The urinary bladder appears normal for the degree of distention. Stomach/Bowel: Stomach is unremarkable. No gastric wall thickening. No evidence of outlet obstruction. Duodenum is normally positioned as is the ligament of Treitz. No small bowel wall thickening. No small bowel dilatation. The terminal ileum is normal. No gross colonic mass. No colonic wall thickening. Vascular/Lymphatic: There is abdominal aortic atherosclerosis without  aneurysm. There is no gastrohepatic or hepatoduodenal ligament lymphadenopathy. No retroperitoneal or mesenteric lymphadenopathy. No pelvic sidewall lymphadenopathy. Reproductive: The uterus is surgically absent. There is no adnexal mass. Other: No intraperitoneal free fluid. Musculoskeletal: No worrisome lytic or sclerotic osseous abnormality.Stable multilevel midthoracic compression fracture. Superior endplate compression deformity at L2 is unchanged. IMPRESSION: 1. Stable exam. No new or progressive findings to suggest recurrent or metastatic disease in the chest, abdomen, or pelvis. 2. Stable appearance of post treatment scarring in the anterior right apex. 3. Aortic Atherosclerosis (ICD10-I70.0). Electronically Signed   By: Misty Stanley M.D.   On: 05/29/2020 10:14     ASSESSMENT/PLAN:  This is a very pleasant 59 year old Caucasian female with extensive stage small cell lung cancer. She presented with a right upper lobe lung mass, large right anterior mediastinal and supraclavicular lymphadenopathy as well as a pancreatic andsplenic metastasis. She was diagnosed in September 2017.  The patient underwent systemic chemotherapy with carboplatin and etoposide. She is status post 6 cycles. She tolerated treatment well except for chemotherapy-induced anemia which required pRBCs and platelet transfusions. She had a significant improvement of her disease with chemotherapy.The patientthenunderwent prophylactic cranial irradiation. She then underwent stereotactic radiotherapy to the right upper lobe and pulmonary nodule.  She had been on observation for 2 years before showing evidence of disease progression.   She recently hadbeen started on systemic chemotherapy with carboplatin, etoposide, and Tecentriq. Starting from cycle #5, she has  been on maintenance single agent Tecentriq. She has been tolerating treatment well.She is status post32cycles of single agent Tecentriq.  The patient was  seen with Dr. Julien Nordmann. Dr. Julien Nordmann personally and independently reviewed the scan and discussed the results with the patient. The scan showed no evidence of disease progression. Dr. Julien Nordmann recommend that she proceed with cycle #33 today as scheduled.   We will see her back for a follow up visit in 3 weeks for evaluation before starting cycle #34.   She will continue to take atarax for itchingif needed.  I have refilled her potassium supplement for 20 meq for 10 days due to hypokalemia.   She will receive a flu shot while in the clinic today.   The patient was advised to call immediately if she has any concerning symptoms in the interval. The patient voices understanding of current disease status and treatment options and is in agreement with the current care plan. All questions were answered. The patient knows to call the clinic with any problems, questions or concerns. We can certainly see the patient much sooner if necessary  No orders of the defined types were placed in this encounter.    Mar Zettler L Anushri Casalino, PA-C 05/31/20  ADDENDUM: Hematology/Oncology Attending: I had a face-to-face encounter with the patient today.  I recommended her care plan.  This is a very pleasant 59 years old white female with recurrent small cell lung cancer.  The patient started on treatment with carboplatin, etoposide and Tecentriq status post 4 cycles and starting from cycle #5 she is on maintenance treatment with single agent Tecentriq status post 32 cycles.  The patient has been tolerating her treatment well with no concerning adverse effects.  She had few episodes of diarrhea after the oral contrast for the CT scan of the abdomen. She had repeat CT scan of the chest, abdomen pelvis performed recently.  I personally and independently reviewed the scans and discussed the results with the patient today. Her scan showed no concerning findings for disease progression. I recommended for her to  continue her current treatment with Tecentriq and she will proceed with cycle #33 today as planned. She will come back for follow-up visit in 3 weeks for evaluation before the next cycle of her treatment. For the itching, she will continue on Atarax on as-needed basis. For the hypokalemia, she will continue with the potassium supplements. The patient was advised to call immediately if she has any concerning symptoms in the interval.  Disclaimer: This note was dictated with voice recognition software. Similar sounding words can inadvertently be transcribed and may be missed upon review. Eilleen Kempf, MD 05/31/20

## 2020-05-31 ENCOUNTER — Inpatient Hospital Stay: Payer: Medicare Other

## 2020-05-31 ENCOUNTER — Other Ambulatory Visit: Payer: Self-pay

## 2020-05-31 ENCOUNTER — Encounter: Payer: Self-pay | Admitting: Physician Assistant

## 2020-05-31 ENCOUNTER — Inpatient Hospital Stay (HOSPITAL_BASED_OUTPATIENT_CLINIC_OR_DEPARTMENT_OTHER): Payer: Medicare Other | Admitting: Physician Assistant

## 2020-05-31 VITALS — BP 136/105 | HR 93 | Temp 97.8°F | Resp 18 | Ht 63.0 in | Wt 124.9 lb

## 2020-05-31 VITALS — BP 126/101 | HR 83 | Resp 18

## 2020-05-31 DIAGNOSIS — R5383 Other fatigue: Secondary | ICD-10-CM

## 2020-05-31 DIAGNOSIS — Z23 Encounter for immunization: Secondary | ICD-10-CM

## 2020-05-31 DIAGNOSIS — E876 Hypokalemia: Secondary | ICD-10-CM | POA: Diagnosis not present

## 2020-05-31 DIAGNOSIS — C3491 Malignant neoplasm of unspecified part of right bronchus or lung: Secondary | ICD-10-CM | POA: Diagnosis not present

## 2020-05-31 DIAGNOSIS — Z5112 Encounter for antineoplastic immunotherapy: Secondary | ICD-10-CM

## 2020-05-31 LAB — CBC WITH DIFFERENTIAL (CANCER CENTER ONLY)
Abs Immature Granulocytes: 0.03 10*3/uL (ref 0.00–0.07)
Basophils Absolute: 0 10*3/uL (ref 0.0–0.1)
Basophils Relative: 0 %
Eosinophils Absolute: 0.2 10*3/uL (ref 0.0–0.5)
Eosinophils Relative: 2 %
HCT: 35.8 % — ABNORMAL LOW (ref 36.0–46.0)
Hemoglobin: 12.3 g/dL (ref 12.0–15.0)
Immature Granulocytes: 0 %
Lymphocytes Relative: 19 %
Lymphs Abs: 1.5 10*3/uL (ref 0.7–4.0)
MCH: 35.1 pg — ABNORMAL HIGH (ref 26.0–34.0)
MCHC: 34.4 g/dL (ref 30.0–36.0)
MCV: 102.3 fL — ABNORMAL HIGH (ref 80.0–100.0)
Monocytes Absolute: 0.4 10*3/uL (ref 0.1–1.0)
Monocytes Relative: 5 %
Neutro Abs: 5.7 10*3/uL (ref 1.7–7.7)
Neutrophils Relative %: 74 %
Platelet Count: 244 10*3/uL (ref 150–400)
RBC: 3.5 MIL/uL — ABNORMAL LOW (ref 3.87–5.11)
RDW: 13.5 % (ref 11.5–15.5)
WBC Count: 7.7 10*3/uL (ref 4.0–10.5)
nRBC: 0 % (ref 0.0–0.2)

## 2020-05-31 LAB — TSH: TSH: 3.162 u[IU]/mL (ref 0.308–3.960)

## 2020-05-31 LAB — CMP (CANCER CENTER ONLY)
ALT: 13 U/L (ref 0–44)
AST: 13 U/L — ABNORMAL LOW (ref 15–41)
Albumin: 3.4 g/dL — ABNORMAL LOW (ref 3.5–5.0)
Alkaline Phosphatase: 109 U/L (ref 38–126)
Anion gap: 8 (ref 5–15)
BUN: 5 mg/dL — ABNORMAL LOW (ref 8–23)
CO2: 28 mmol/L (ref 22–32)
Calcium: 8.7 mg/dL — ABNORMAL LOW (ref 8.9–10.3)
Chloride: 103 mmol/L (ref 98–111)
Creatinine: 0.91 mg/dL (ref 0.44–1.00)
GFR, Est AFR Am: >60 mL/min (ref >60)
GFR, Estimated: >60 mL/min (ref >60)
Glucose, Bld: 108 mg/dL — ABNORMAL HIGH (ref 70–99)
Potassium: 3 mmol/L — CL (ref 3.5–5.1)
Sodium: 139 mmol/L (ref 135–145)
Total Bilirubin: 0.5 mg/dL (ref 0.3–1.2)
Total Protein: 6.8 g/dL (ref 6.5–8.1)

## 2020-05-31 MED ORDER — HEPARIN SOD (PORK) LOCK FLUSH 100 UNIT/ML IV SOLN
500.0000 [IU] | Freq: Once | INTRAVENOUS | Status: AC | PRN
  Administered 2020-05-31: 500 [IU]
  Filled 2020-05-31: qty 5

## 2020-05-31 MED ORDER — POTASSIUM CHLORIDE 20 MEQ/15ML (10%) PO SOLN: 20.0000 meq | Freq: Every day | ORAL | 0 refills | Status: DC

## 2020-05-31 MED ORDER — SODIUM CHLORIDE 0.9% FLUSH
10.0000 mL | INTRAVENOUS | Status: DC | PRN
  Administered 2020-05-31: 10 mL
  Filled 2020-05-31: qty 10

## 2020-05-31 MED ORDER — SODIUM CHLORIDE 0.9 % IV SOLN
Freq: Once | INTRAVENOUS | Status: AC
  Filled 2020-05-31: qty 250

## 2020-05-31 MED ORDER — INFLUENZA VAC SPLIT QUAD 0.5 ML IM SUSY
0.5000 mL | PREFILLED_SYRINGE | Freq: Once | INTRAMUSCULAR | Status: AC
  Administered 2020-05-31: 0.5 mL via INTRAMUSCULAR

## 2020-05-31 MED ORDER — SODIUM CHLORIDE 0.9 % IV SOLN
1200.0000 mg | Freq: Once | INTRAVENOUS | Status: AC
  Administered 2020-05-31: 1200 mg via INTRAVENOUS
  Filled 2020-05-31: qty 20

## 2020-05-31 NOTE — Patient Instructions (Addendum)
[facility] Discharge Instructions for Patients Receiving Chemotherapy  Today you received the following chemotherapy agents: atezolizumab.  To help prevent nausea and vomiting after your treatment, we encourage you to take your nausea medication as directed.   If you develop nausea and vomiting that is not controlled by your nausea medication, call the clinic.   BELOW ARE SYMPTOMS THAT SHOULD BE REPORTED IMMEDIATELY:  *FEVER GREATER THAN 100.5 F  *CHILLS WITH OR WITHOUT FEVER  NAUSEA AND VOMITING THAT IS NOT CONTROLLED WITH YOUR NAUSEA MEDICATION  *UNUSUAL SHORTNESS OF BREATH  *UNUSUAL BRUISING OR BLEEDING  TENDERNESS IN MOUTH AND THROAT WITH OR WITHOUT PRESENCE OF ULCERS  *URINARY PROBLEMS  *BOWEL PROBLEMS  UNUSUAL RASH Items with * indicate a potential emergency and should be followed up as soon as possible.  Feel free to call the clinic should you have any questions or concerns. The clinic phone number is [phone].  Please show the [name] at check-in to the Emergency Department and triage nurse.

## 2020-05-31 NOTE — Progress Notes (Signed)
Per Cassie Heilingoetter, PAC, ok to treat with elevated BP and low K. No new orders given. Rx sent to pharmacy for potassium supplement.

## 2020-06-14 IMAGING — CT CT ABD-PELV W/ CM
2 of 5 series · 13 of 46 positions shown, 15 images · IV contrast (OMNIPAQUE)
Comparison: CT the chest, abdomen and pelvis 03/13/2018.

CLINICAL DATA: 57-year-old female with history of right-sided lung
cancer diagnosed in 3667 status post chemotherapy and radiation
therapy which is now complete. Follow-up study.

EXAM:
CT CHEST, ABDOMEN, AND PELVIS WITH CONTRAST
TECHNIQUE: Multidetector CT imaging of the chest, abdomen and pelvis was
performed following the standard protocol during bolus
administration of intravenous contrast.
CONTRAST:  100mL OMNIPAQUE IOHEXOL 300 MG/ML  SOLN

[Series 2: cap with · axial · 0.82mm/px · z∈[-566,-66]mm · 10 of 122 slices shown, 12 images]
[im 11/122  soft-tissue]
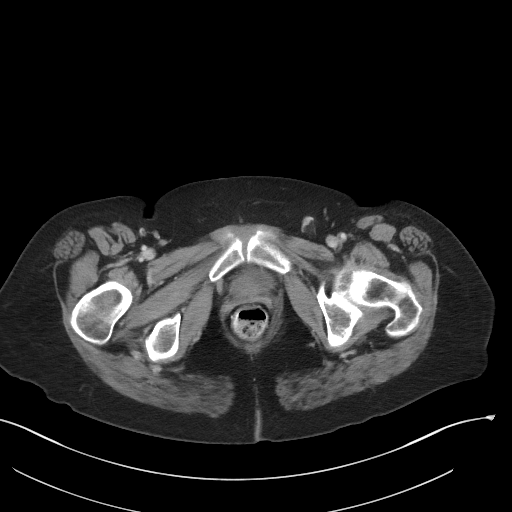
[im 11/122  bone]
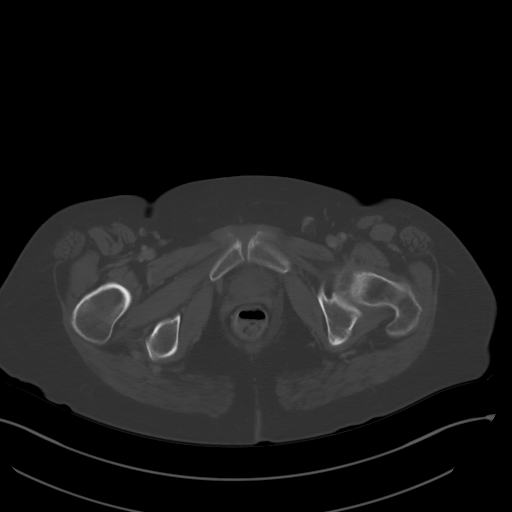
[im 21/122  soft-tissue]
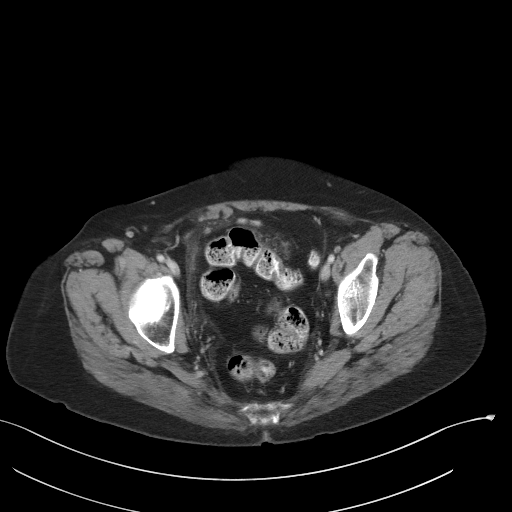
[im 31/122  soft-tissue]
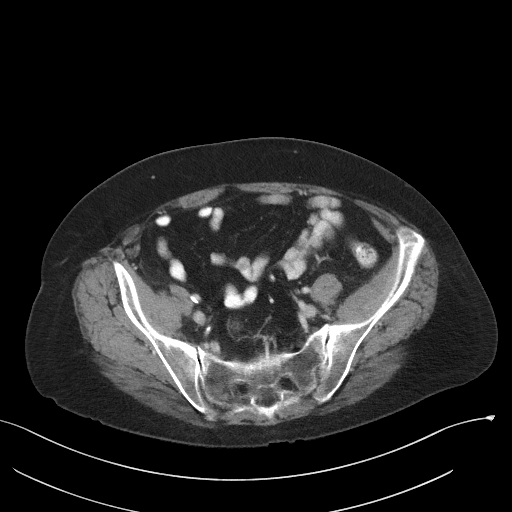
[im 41/122  soft-tissue]
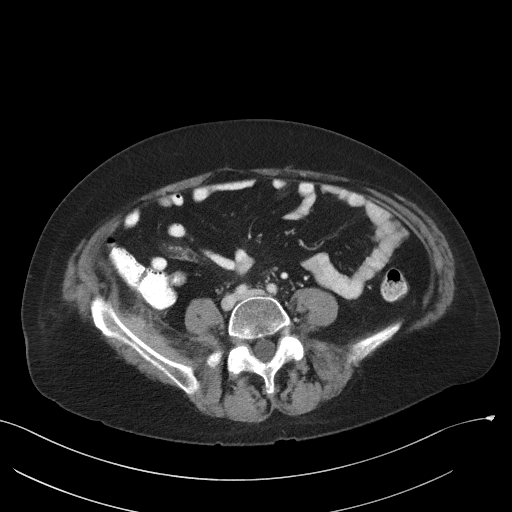
[im 51/122  soft-tissue]
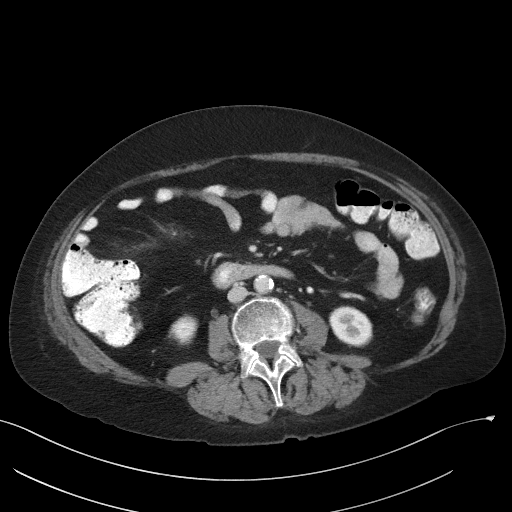
[im 71/122  soft-tissue]
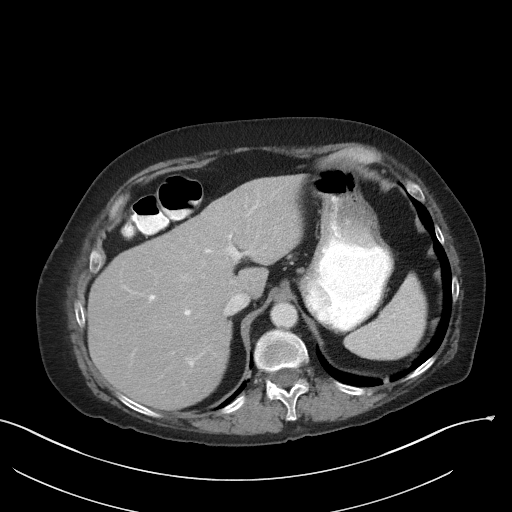
[im 81/122  soft-tissue]
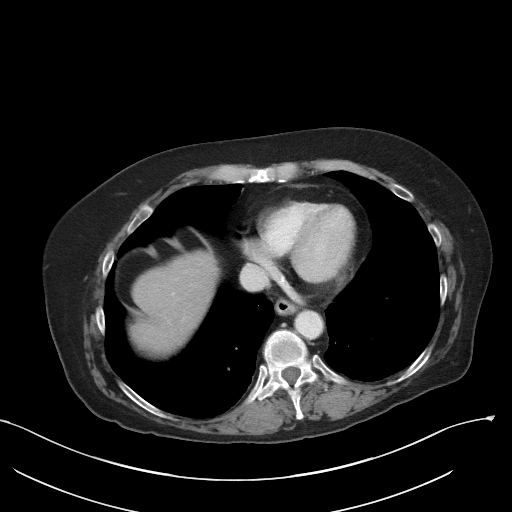
[im 91/122  soft-tissue]
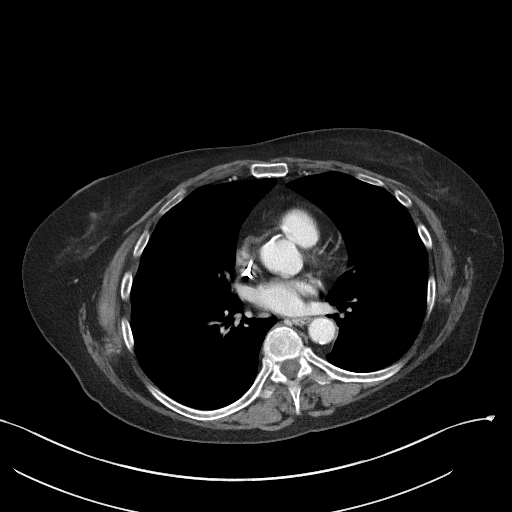
[im 101/122  soft-tissue]
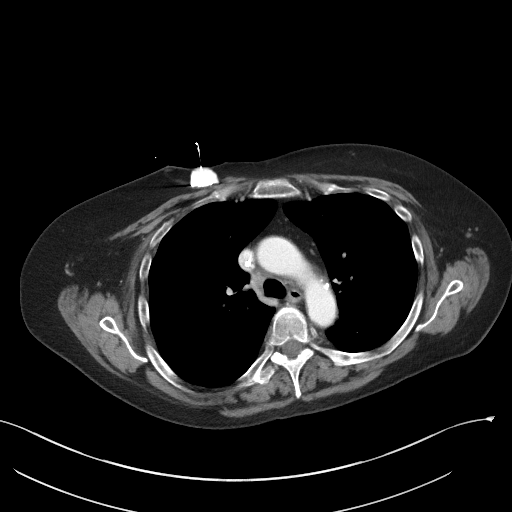
[im 101/122  bone]
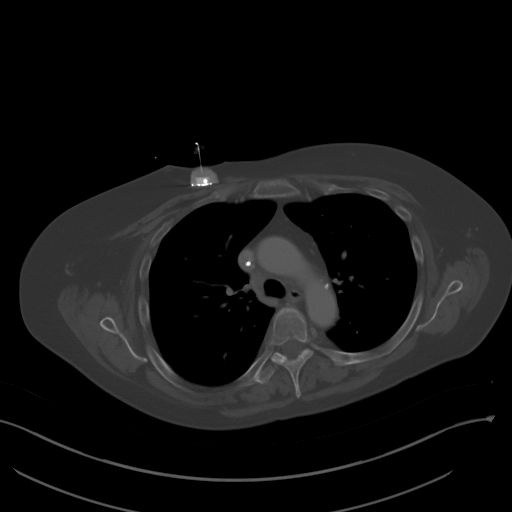
[im 111/122  soft-tissue]
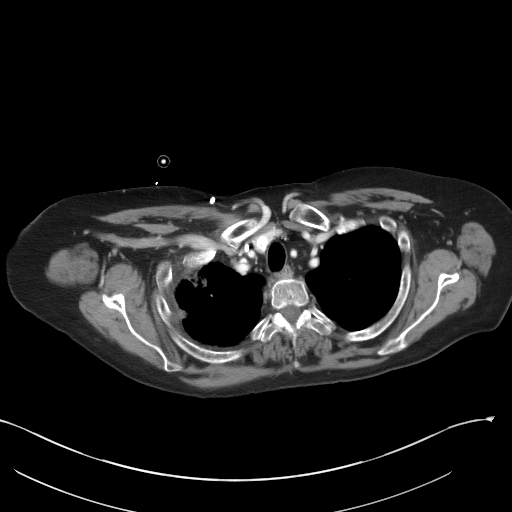

[Series 5: coronals · coronal · 0.82mm/px · 3 of 154 slices shown]
[im 52/154  soft-tissue]
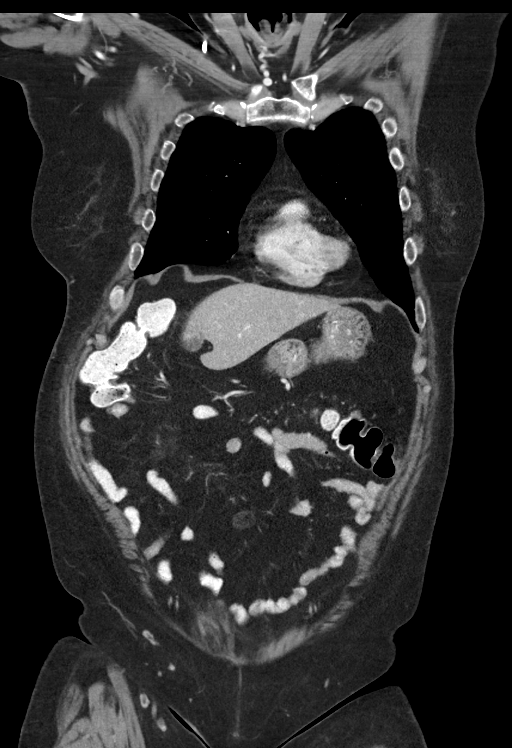
[im 69/154  soft-tissue]
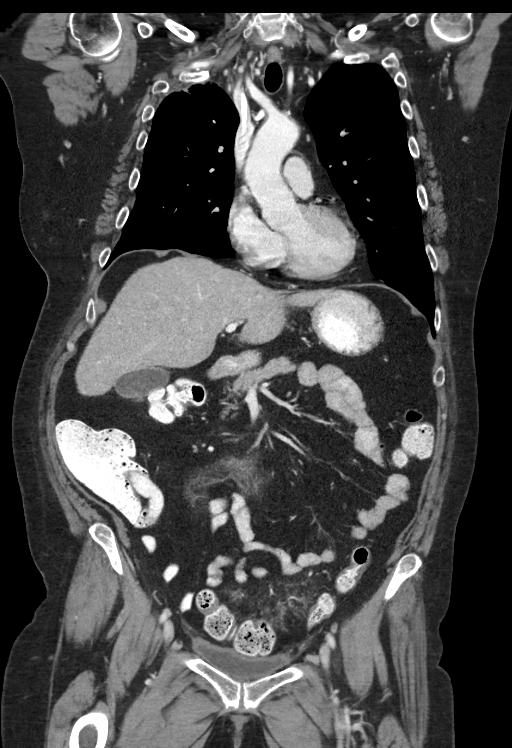
[im 86/154  soft-tissue]
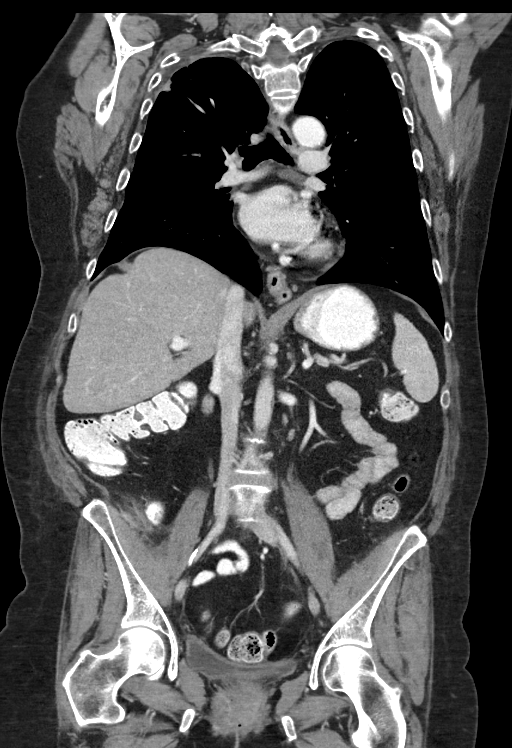

[13 of 46 positions shown; findings below may reference images not displayed]

FINDINGS: CT CHEST FINDINGS

Cardiovascular: Heart size is normal. There is no significant
pericardial fluid, thickening or pericardial calcification. There is
aortic atherosclerosis, as well as atherosclerosis of the great
vessels of the mediastinum and the coronary arteries, including
calcified atherosclerotic plaque in the left anterior descending
coronary artery. Right internal jugular single-lumen porta cath with
tip terminating at the superior cavoatrial junction.

Mediastinum/Nodes: No pathologically enlarged mediastinal or hilar
lymph nodes. Esophagus is unremarkable in appearance. No axillary
lymphadenopathy.

Lungs/Pleura: There continues to be extensive ground-glass
attenuation and pleuroparenchymal architectural distortion in the
apex of the right upper lobe related to prior radiation therapy. The
largest nodular area in this region has increased in size, currently
measuring 1.6 x 2.0 x 1.4 cm (axial image 28 of series 7 and coronal
image 79 of series 5). No other definite suspicious appearing
pulmonary nodules or masses are noted elsewhere in the lungs. No
acute consolidative airspace disease. No pleural effusions.

Musculoskeletal: There are no aggressive appearing lytic or blastic
lesions noted in the visualized portions of the skeleton.

CT ABDOMEN PELVIS FINDINGS

Hepatobiliary: Wedge-shaped area of low attenuation and/or
hypoperfusion in segment 4B of the liver adjacent to the falciform
ligament, strongly favored to represent a benign perfusion anomaly.
No other definite suspicious appearing hepatic lesions. No intra or
extrahepatic biliary ductal dilatation. Gallbladder is normal in
appearance.

Pancreas: No pancreatic mass. No pancreatic ductal dilatation. No
pancreatic or peripancreatic fluid or inflammatory changes.

Spleen: Unremarkable.

Adrenals/Urinary Tract: Bilateral kidneys and bilateral adrenal
glands are normal in appearance. No hydroureteronephrosis. Urinary
bladder is normal in appearance.

Stomach/Bowel: Normal appearance of the stomach. No pathologic
dilatation of small bowel or colon. Appendix is not confidently
identified may be surgically absent.

Vascular/Lymphatic: Aortic atherosclerosis, without evidence of
aneurysm or dissection noted in the abdominal or pelvic vasculature.
No lymphadenopathy noted in the abdomen or pelvis.

Reproductive: Status post hysterectomy. Ovaries are not confidently
identified may be surgically absent or atrophic.

Other: There continues to be haziness in the small bowel mesentery,
best appreciated on axial image 76 of series 2, which has
progressively worsened over the prior 2 examinations. Similar
worsening haziness is also noted in the sigmoid mesocolon (axial
image 99 of series 2). As well, nodular thickening and surrounding
haziness is noted along the anterior surface of the right iliacus
musculature and in the adjacent retroperitoneal fat best appreciated
on axial image 83 of series 2. These findings are all concerning for
sites of metastatic disease both in the mesentery, sigmoid mesocolon
and right retroperitoneum. No significant volume of ascites. No
pneumoperitoneum.

Musculoskeletal: Chronic compression fracture of L1 with 25% loss of
anterior vertebral body height (unchanged). There are no aggressive
appearing lytic or blastic lesions noted in the visualized portions
of the skeleton.
IMPRESSION: 1. Worsening areas of soft tissue haziness in the small bowel
mesentery, sigmoid mesocolon, as well as nodularity and soft tissue
haziness in the right hemipelvis in the retroperitoneum, all of
which are unusual in appearance but concerning for sites of
progressive metastatic disease.
2. Slight increased nodularity in the periphery of the right upper
lobe near the apex. This may simply reflect progressive
postradiation mass-like fibrosis, however, the possibility of
residual/recurrent disease in this area is not excluded, and close
attention on follow-up studies is recommended.
3. Aortic atherosclerosis, in addition to left anterior descending
coronary artery disease. Please note that although the presence of
coronary artery calcium documents the presence of coronary artery
disease, the severity of this disease and any potential stenosis
cannot be assessed on this non-gated CT examination. Assessment for
potential risk factor modification, dietary therapy or pharmacologic
therapy may be warranted, if clinically indicated.
4. Additional incidental findings, as above.

Aortic Atherosclerosis ([id number]).

## 2020-06-16 NOTE — Progress Notes (Signed)
[facility] OFFICE PROGRESS NOTE  [name], MD [address] [place] [address]  DIAGNOSIS: Extensive stage (T2a, N3, [name], large right anterior mediastinal and supraclavicular lymphadenopathy as well as pancreatic and splenic metastasis diagnosed in September 2017. The patient had disease progression in October 2019.  PRIOR THERAPY: 1) Systemic chemotherapy was carboplatin for AUC of 5 on day 1 and etoposide 100 MG/M2 on days 1, 2 and 3 with Neulasta support. Status post 6 cycles with significant response of her disease. 2) Prophylactic cranial irradiation under the care of Dr. Sondra Come on 12/02/2016. 3) stereotactic radiotherapy to the recurrent right upper lobe pulmonary nodule under the care of Dr. Sondra Come completed November 10, 2017. 4)Retreatment with systemic chemotherapy with carboplatin for AUC of 5 on day 1 and etoposide 100 mg/M2 on days 1, 2 and 3 as well as Tecentriq (Atezolizumab) 1200 mg IV every 3 weeks with Neulasta support. First dose June 22, 2018 for disease recurrence. Status post 5 cycles. Starting from cycle #2 her dose of carboplatin will be reduced to AUC of 4 and etoposide 80 mg/M2 on days 1, 2 and 3 in addition to the regular dose of Tecentriq  CURRENT THERAPY: Maintenance treatment with single agent Tecentriq 1200 mg IV every 3 weeks. Status post33cycles  INTERVAL HISTORY: Debra Kidd 59 y.o. female returns to the clinic for a follow up visit. The patient is feeling well today without any concerning complaints except for some muscle cramps in her abdomen that started 2 days ago. She believes her potassium is low because she states she has muscle cramping when her potassium is low. She completed an oral prescription of potassium as prescribed at her last visit.The patient continues to tolerate treatment with immunotherapy with Tecentriqwell without any adverse effects except  itching for which she uses atarax.Her itching has been better recently though.  Denies any fever, chills, or night sweats.She lost about 1 lb since her last appointment.Denies any chest pain or hemoptysisbut endorses her baseline coughand dyspnea on exertion.Denies any vomiting or nausea. She has intermittent constipation and diarrhea. Denies any visual changes. She received her COVID-19 booster vaccine a few weeks ago and states she had a headache on 3 occassions following the vaccine that lasted most of the day. The headache was localized to her forehead. No associated visual changes or changes in her balance. Her last headache was a few weeks ago. The patient is here today for evaluation prior to starting cycle #34.  MEDICAL HISTORY: Past Medical History:  Diagnosis Date  . 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 Blood type, Rh positive   . Cancer ([facility])   . Chronic pain syndrome   . Depression 08/07/2016  . Encounter for antineoplastic chemotherapy 07/17/2016  . History of external beam radiation therapy 11/19/16-12/02/16   brain 25 Gy in 10 fractions  . Hyperlipidemia   . Hypertension   . Hypertension 08/07/2016  . Osteoporosis   . Small cell lung cancer ([facility]) dx'd 05/2016    ALLERGIES:  is allergic to codeine, motrin [ibuprofen], thiazide-type diuretics, and vicodin [hydrocodone-acetaminophen].  MEDICATIONS:  Current Outpatient Medications  Medication Sig Dispense Refill  . clopidogrel (PLAVIX) 75 MG tablet Take 1 tablet (75 mg total) by mouth daily. 30 tablet 0  . diazepam (VALIUM) 5 MG tablet Take 5 mg by mouth 3 (three) times daily as needed for anxiety.  2  . hydrOXYzine (ATARAX/VISTARIL) 10 MG tablet Take 1 tablet (10 mg total) by mouth 3 (three) times daily as needed. 90 tablet 1  . lidocaine-prilocaine (EMLA) cream Apply 1 application topically as needed. 30 g 0  . oxyCODONE-acetaminophen (PERCOCET) 5-325 MG tablet Take 1 tablet by mouth every 6 (six) hours as  needed for severe pain. 30 tablet 0  . polyethylene glycol (MIRALAX / GLYCOLAX) packet Take 17 g by mouth daily. 14 each 0  . potassium chloride 20 MEQ/15ML (10%) SOLN Take 15 mLs (20 mEq total) by mouth daily. 150 mL 0  . prochlorperazine (COMPAZINE) 10 MG tablet Take 1 tablet (10 mg total) by mouth every 6 (six) hours as needed for nausea or vomiting. 30 tablet 0  . traMADol (ULTRAM) 50 MG tablet Take 50 mg by mouth every 6 (six) hours as needed for moderate pain.     . Vitamin D, Ergocalciferol, (DRISDOL) 1.25 MG (50000 UT) CAPS capsule Take 1 capsule (50,000 Units total) by mouth every 7 (seven) days. (Patient not taking: Reported on 06/21/2020) 5 capsule 0   No current facility-administered medications for this visit.   Facility-Administered Medications Ordered in Other Visits  Medication Dose Route Frequency Provider Last Rate Last Admin  . 0.9 %  sodium chloride infusion   Intravenous Once [name], MD      . [facility]) 1,200 mg in sodium chloride 0.9 % 250 mL chemo infusion  1,200 mg Intravenous Once [name], MD      . heparin lock flush 100 unit/mL  500 Units Intracatheter Once PRN [name], MD      . sodium chloride flush (NS) 0.9 % injection 10 mL  10 mL Intracatheter PRN [name], MD        SURGICAL HISTORY:  Past Surgical History:  Procedure Laterality Date  . ABDOMINAL HYSTERECTOMY  1981  . APPENDECTOMY     at age 81  . EYE SURGERY     left  . IR GENERIC HISTORICAL  06/03/2016   IR FLUORO GUIDE PORT INSERTION RIGHT 06/03/2016 [facility]  . IR GENERIC HISTORICAL  06/03/2016   IR US GUIDE VASC ACCESS RIGHT 06/03/2016 [facility]  . SKIN CANCER EXCISION     basal cell carcinoma L side of face    REVIEW OF SYSTEMS:   Review of Systems Constitutional: Negative for appetite change, chills, fatigue, fever and unexpected weight change.  HENT: Negative for mouth sores, nosebleeds, sore throat and trouble swallowing.  Eyes:  Negative for eye problems and icterus.  Respiratory:Positive for baseline coughand shortness of breath with exertion.Negative for hemoptysis and wheezing.  Cardiovascular: Negative for chest pain and leg swelling.  Gastrointestinal: Positive for intermittent diarrhea and constipation. Negative for abdominal pain, constipation,  nausea and vomiting.  Genitourinary: Negative for bladder incontinence, difficulty urinating, dysuria, frequency and hematuria.  Musculoskeletal: Positive for muscle spasms/cramping. Negative for back pain, gait problem, neck pain and neck stiffness.  Skin:Positive for itching(improved from prior).Negative for rash.  Neurological:Positive for limping secondary to history of stroke.Negative for dizziness, extremity weakness, headaches, [name], depression and sleep disturbance. The patient is not nervous/anxious.   PHYSICAL EXAMINATION:  Blood pressure (!) 167/92, pulse 91, temperature (!) 97 F (36.1 C), temperature source Tympanic, resp. rate 16, height 5\' 3"  (1.6 m), weight 123 lb 11.2 oz (56.1 kg), SpO2 100 %.  ECOG PERFORMANCE STATUS: 1 - Symptomatic but completely  ambulatory  Physical Exam  Constitutional: Oriented to person, place, and time andchronically ill appearing femaleand in no distress.  HENT:  Head: Normocephalic and atraumatic.  Mouth/Throat: Oropharynx is clear and moist. No oropharyngeal exudate.  Eyes: Conjunctivae are normal. Right eye exhibits no discharge. Left eye exhibits no discharge. No scleral icterus.  Neck: Normal range of motion. Neck supple.  Cardiovascular: Normal rate, regular rhythm, normal heart sounds and intact distal pulses.  Pulmonary/Chest: Effort normal and breath sounds normal. No respiratory distress. No wheezes. No rales.  Abdominal: Soft. Bowel sounds are normal.  Exhibits no distension and no mass. There is no tenderness.  Musculoskeletal: Normal range of motion. Exhibits no edema.  Lymphadenopathy:  No cervical adenopathy.  Neurological: Alert and oriented to person, place, and time. Exhibits normal muscle tone. Positive for limping due to history of stroke. Skin: Skin is warm and dry. No rash noted. Not diaphoretic. No erythema. No pallor.  Psychiatric: Mood, memory and judgment normal.  Vitals reviewed.  LABORATORY DATA: Lab Results  Component Value Date   WBC 8.1 06/21/2020   HGB 12.6 06/21/2020   HCT 35.7 (L) 06/21/2020   MCV 100.6 (H) 06/21/2020   PLT 250 06/21/2020      Chemistry      Component Value Date/Time   NA 135 06/21/2020 0800   NA 141 08/01/2017 0835   K 2.9 (LL) 06/21/2020 0800   K 3.5 08/01/2017 0835   CL 100 06/21/2020 0800   CO2 27 06/21/2020 0800   CO2 25 08/01/2017 0835   BUN 8 06/21/2020 0800   BUN 8.0 08/01/2017 0835   CREATININE 0.99 06/21/2020 0800   CREATININE 0.9 08/01/2017 0835      Component Value Date/Time   CALCIUM 9.0 06/21/2020 0800   CALCIUM 9.9 08/01/2017 0835   ALKPHOS 118 06/21/2020 0800   ALKPHOS 142 08/01/2017 0835   AST 14 (L) 06/21/2020 0800   AST 15 08/01/2017 0835   ALT 13 06/21/2020 0800   ALT 19 08/01/2017 0835   BILITOT 0.5 06/21/2020 0800   BILITOT 0.35 08/01/2017 0835       RADIOGRAPHIC STUDIES:  CT Chest W Contrast  Result Date: 05/29/2020 CLINICAL DATA:  Lung cancer.  Metastatic disease. EXAM: CT CHEST, ABDOMEN, AND PELVIS WITH CONTRAST TECHNIQUE: Multidetector CT imaging of the chest, abdomen and pelvis was performed following the standard protocol during bolus administration of intravenous contrast. CONTRAST:  152mL OMNIPAQUE IOHEXOL 300 MG/ML  SOLN COMPARISON:  03/07/2020 FINDINGS: CT CHEST FINDINGS Cardiovascular: The heart size is normal. No substantial pericardial effusion. Coronary artery calcification is evident. Atherosclerotic calcification is noted in the  wall of the thoracic aorta. Right Port-A-Cath tip is positioned at the SVC/RA junction. Mediastinum/Nodes: No mediastinal lymphadenopathy. There is no hilar lymphadenopathy. The esophagus has normal imaging features. There is no axillary lymphadenopathy. Lungs/Pleura: Stable appearance of post treatment scarring in the anterior right apex. No suspicious pulmonary nodule or mass. No focal airspace consolidation. Insert no effusions Musculoskeletal: No worrisome lytic or sclerotic osseous abnormality. CT ABDOMEN PELVIS FINDINGS Hepatobiliary: No suspicious focal abnormality within the liver parenchyma. Small area of low attenuation in the anterior liver, adjacent to the falciform ligament, is in a characteristic location for focal fatty deposition. There is no evidence for gallstones, gallbladder wall thickening, or pericholecystic fluid. No intrahepatic or extrahepatic biliary dilation. Pancreas: No focal mass lesion. No dilatation of the main duct. No intraparenchymal cyst. No peripancreatic edema. Spleen: No splenomegaly. No focal mass lesion. Adrenals/Urinary Tract: No adrenal nodule or  mass. Kidneys unremarkable. No evidence for hydroureter. The urinary bladder appears normal for the degree of distention. Stomach/Bowel: Stomach is unremarkable. No gastric wall thickening. No evidence of outlet obstruction. Duodenum is normally positioned as is the ligament of Treitz. No small bowel wall thickening. No small bowel dilatation. The terminal ileum is normal. No gross colonic mass. No colonic wall thickening. Vascular/Lymphatic: There is abdominal aortic atherosclerosis without aneurysm. There is no gastrohepatic or hepatoduodenal ligament lymphadenopathy. No retroperitoneal or mesenteric lymphadenopathy. No pelvic sidewall lymphadenopathy. Reproductive: The uterus is surgically absent. There is no adnexal mass. Other: No intraperitoneal free fluid. Musculoskeletal: No worrisome lytic or sclerotic osseous  abnormality.Stable multilevel midthoracic compression fracture. Superior endplate compression deformity at L2 is unchanged. IMPRESSION: 1. Stable exam. No new or progressive findings to suggest recurrent or metastatic disease in the chest, abdomen, or pelvis. 2. Stable appearance of post treatment scarring in the anterior right apex. 3. Aortic Atherosclerosis (ICD10-I70.0). Electronically Signed   By: Misty Stanley M.D.   On: 05/29/2020 10:14   CT Abdomen Pelvis W Contrast  Result Date: 05/29/2020 CLINICAL DATA:  Lung cancer.  Metastatic disease. EXAM: CT CHEST, ABDOMEN, AND PELVIS WITH CONTRAST TECHNIQUE: Multidetector CT imaging of the chest, abdomen and pelvis was performed following the standard protocol during bolus administration of intravenous contrast. CONTRAST:  124mL OMNIPAQUE IOHEXOL 300 MG/ML  SOLN COMPARISON:  03/07/2020 FINDINGS: CT CHEST FINDINGS Cardiovascular: The heart size is normal. No substantial pericardial effusion. Coronary artery calcification is evident. Atherosclerotic calcification is noted in the wall of the thoracic aorta. Right Port-A-Cath tip is positioned at the SVC/RA junction. Mediastinum/Nodes: No mediastinal lymphadenopathy. There is no hilar lymphadenopathy. The esophagus has normal imaging features. There is no axillary lymphadenopathy. Lungs/Pleura: Stable appearance of post treatment scarring in the anterior right apex. No suspicious pulmonary nodule or mass. No focal airspace consolidation. Insert no effusions Musculoskeletal: No worrisome lytic or sclerotic osseous abnormality. CT ABDOMEN PELVIS FINDINGS Hepatobiliary: No suspicious focal abnormality within the liver parenchyma. Small area of low attenuation in the anterior liver, adjacent to the falciform ligament, is in a characteristic location for focal fatty deposition. There is no evidence for gallstones, gallbladder wall thickening, or pericholecystic fluid. No intrahepatic or extrahepatic biliary dilation.  Pancreas: No focal mass lesion. No dilatation of the main duct. No intraparenchymal cyst. No peripancreatic edema. Spleen: No splenomegaly. No focal mass lesion. Adrenals/Urinary Tract: No adrenal nodule or mass. Kidneys unremarkable. No evidence for hydroureter. The urinary bladder appears normal for the degree of distention. Stomach/Bowel: Stomach is unremarkable. No gastric wall thickening. No evidence of outlet obstruction. Duodenum is normally positioned as is the ligament of Treitz. No small bowel wall thickening. No small bowel dilatation. The terminal ileum is normal. No gross colonic mass. No colonic wall thickening. Vascular/Lymphatic: There is abdominal aortic atherosclerosis without aneurysm. There is no gastrohepatic or hepatoduodenal ligament lymphadenopathy. No retroperitoneal or mesenteric lymphadenopathy. No pelvic sidewall lymphadenopathy. Reproductive: The uterus is surgically absent. There is no adnexal mass. Other: No intraperitoneal free fluid. Musculoskeletal: No worrisome lytic or sclerotic osseous abnormality.Stable multilevel midthoracic compression fracture. Superior endplate compression deformity at L2 is unchanged. IMPRESSION: 1. Stable exam. No new or progressive findings to suggest recurrent or metastatic disease in the chest, abdomen, or pelvis. 2. Stable appearance of post treatment scarring in the anterior right apex. 3. Aortic Atherosclerosis (ICD10-I70.0). Electronically Signed   By: Misty Stanley M.D.   On: 05/29/2020 10:14     ASSESSMENT/PLAN:  This is a very pleasant 59 year old Caucasian  female with extensive stage small cell lung cancer. She presented with a right upper lobe lung mass, large right anterior mediastinal and supraclavicular lymphadenopathy as well as a pancreatic andsplenic metastasis. She was diagnosed in September 2017.  The patient underwent systemic chemotherapy with carboplatin and etoposide. She is status post 6 cycles. She tolerated treatment  well except for chemotherapy-induced anemia which required pRBCs and platelet transfusions. She had a significant improvement of her disease with chemotherapy.The patientthenunderwent prophylactic cranial irradiation. She then underwent stereotactic radiotherapy to the right upper lobe and pulmonary nodule.  She had been on observation for 2 years before showing evidence of disease progression.   She recently hadbeen started on systemic chemotherapy with carboplatin, etoposide, and Tecentriq. Starting from cycle #5, she has been on maintenance single agent Tecentriq. She has been tolerating treatment well.She is status post33cycles of single agent Tecentriq.  Labs were reviewed. Recommend that she proceed with cycle #34 today as scheduled.   We will see her back for a follow up visit in 3 weeks for evaluation before starting cycle #35.  She will continue to take atarax for itchingif needed.  The patient's labs show hypokalemia. We will arrange for her to receive 20 meq of potassium chloride IV today. I will also send a prescription for oral potassium to her pharmacy. She was instructed to take imodium for diarrhea if needed.   The patient was advised to call immediately if she has any concerning symptoms in the interval. The patient voices understanding of current disease status and treatment options and is in agreement with the current care plan. All questions were answered. The patient knows to call the clinic with any problems, questions or concerns. We can certainly see the patient much sooner if necessary     No orders of the defined types were placed in this encounter.    Khoen Genet L Hansen Carino, PA-C 06/21/20

## 2020-06-21 ENCOUNTER — Other Ambulatory Visit: Payer: Self-pay

## 2020-06-21 ENCOUNTER — Inpatient Hospital Stay: Payer: Medicare Other

## 2020-06-21 ENCOUNTER — Inpatient Hospital Stay (HOSPITAL_BASED_OUTPATIENT_CLINIC_OR_DEPARTMENT_OTHER): Payer: Medicare Other | Admitting: Physician Assistant

## 2020-06-21 ENCOUNTER — Encounter: Payer: Self-pay | Admitting: *Deleted

## 2020-06-21 ENCOUNTER — Encounter: Payer: Self-pay | Admitting: Physician Assistant

## 2020-06-21 ENCOUNTER — Inpatient Hospital Stay: Payer: Medicare Other | Attending: Internal Medicine

## 2020-06-21 VITALS — BP 167/92 | HR 91 | Temp 97.0°F | Resp 16 | Ht 63.0 in | Wt 123.7 lb

## 2020-06-21 DIAGNOSIS — I10 Essential (primary) hypertension: Secondary | ICD-10-CM | POA: Insufficient documentation

## 2020-06-21 DIAGNOSIS — C3411 Malignant neoplasm of upper lobe, right bronchus or lung: Secondary | ICD-10-CM | POA: Diagnosis not present

## 2020-06-21 DIAGNOSIS — K5900 Constipation, unspecified: Secondary | ICD-10-CM | POA: Diagnosis not present

## 2020-06-21 DIAGNOSIS — E876 Hypokalemia: Secondary | ICD-10-CM

## 2020-06-21 DIAGNOSIS — G894 Chronic pain syndrome: Secondary | ICD-10-CM | POA: Diagnosis not present

## 2020-06-21 DIAGNOSIS — Z5112 Encounter for antineoplastic immunotherapy: Secondary | ICD-10-CM | POA: Insufficient documentation

## 2020-06-21 DIAGNOSIS — M810 Age-related osteoporosis without current pathological fracture: Secondary | ICD-10-CM | POA: Insufficient documentation

## 2020-06-21 DIAGNOSIS — E785 Hyperlipidemia, unspecified: Secondary | ICD-10-CM | POA: Insufficient documentation

## 2020-06-21 DIAGNOSIS — C3491 Malignant neoplasm of unspecified part of right bronchus or lung: Secondary | ICD-10-CM

## 2020-06-21 DIAGNOSIS — I700 Atherosclerosis of aorta: Secondary | ICD-10-CM | POA: Insufficient documentation

## 2020-06-21 DIAGNOSIS — Z85828 Personal history of other malignant neoplasm of skin: Secondary | ICD-10-CM | POA: Insufficient documentation

## 2020-06-21 DIAGNOSIS — R197 Diarrhea, unspecified: Secondary | ICD-10-CM | POA: Insufficient documentation

## 2020-06-21 DIAGNOSIS — R5383 Other fatigue: Secondary | ICD-10-CM

## 2020-06-21 DIAGNOSIS — Z9221 Personal history of antineoplastic chemotherapy: Secondary | ICD-10-CM | POA: Insufficient documentation

## 2020-06-21 DIAGNOSIS — C7889 Secondary malignant neoplasm of other digestive organs: Secondary | ICD-10-CM | POA: Insufficient documentation

## 2020-06-21 LAB — CBC WITH DIFFERENTIAL (CANCER CENTER ONLY)
Abs Immature Granulocytes: 0.02 10*3/uL (ref 0.00–0.07)
Basophils Absolute: 0 10*3/uL (ref 0.0–0.1)
Basophils Relative: 1 %
Eosinophils Absolute: 0.2 10*3/uL (ref 0.0–0.5)
Eosinophils Relative: 2 %
HCT: 35.7 % — ABNORMAL LOW (ref 36.0–46.0)
Hemoglobin: 12.6 g/dL (ref 12.0–15.0)
Immature Granulocytes: 0 %
Lymphocytes Relative: 24 %
Lymphs Abs: 2 10*3/uL (ref 0.7–4.0)
MCH: 35.5 pg — ABNORMAL HIGH (ref 26.0–34.0)
MCHC: 35.3 g/dL (ref 30.0–36.0)
MCV: 100.6 fL — ABNORMAL HIGH (ref 80.0–100.0)
Monocytes Absolute: 0.6 10*3/uL (ref 0.1–1.0)
Monocytes Relative: 7 %
Neutro Abs: 5.4 10*3/uL (ref 1.7–7.7)
Neutrophils Relative %: 66 %
Platelet Count: 250 10*3/uL (ref 150–400)
RBC: 3.55 MIL/uL — ABNORMAL LOW (ref 3.87–5.11)
RDW: 13.6 % (ref 11.5–15.5)
WBC Count: 8.1 10*3/uL (ref 4.0–10.5)
nRBC: 0 % (ref 0.0–0.2)

## 2020-06-21 LAB — TSH: TSH: 1.849 u[IU]/mL (ref 0.308–3.960)

## 2020-06-21 LAB — CMP (CANCER CENTER ONLY)
ALT: 13 U/L (ref 0–44)
AST: 14 U/L — ABNORMAL LOW (ref 15–41)
Albumin: 3.6 g/dL (ref 3.5–5.0)
Alkaline Phosphatase: 118 U/L (ref 38–126)
Anion gap: 8 (ref 5–15)
BUN: 8 mg/dL (ref 8–23)
CO2: 27 mmol/L (ref 22–32)
Calcium: 9 mg/dL (ref 8.9–10.3)
Chloride: 100 mmol/L (ref 98–111)
Creatinine: 0.99 mg/dL (ref 0.44–1.00)
GFR, Estimated: >60 mL/min (ref >60)
Glucose, Bld: 114 mg/dL — ABNORMAL HIGH (ref 70–99)
Potassium: 2.9 mmol/L — CL (ref 3.5–5.1)
Sodium: 135 mmol/L (ref 135–145)
Total Bilirubin: 0.5 mg/dL (ref 0.3–1.2)
Total Protein: 6.9 g/dL (ref 6.5–8.1)

## 2020-06-21 MED ORDER — POTASSIUM CHLORIDE 20 MEQ/15ML (10%) PO SOLN: 20.0000 meq | Freq: Every day | ORAL | 0 refills | Status: DC

## 2020-06-21 MED ORDER — SODIUM CHLORIDE 0.9 % IV SOLN
1200.0000 mg | Freq: Once | INTRAVENOUS | Status: AC
  Administered 2020-06-21: 1200 mg via INTRAVENOUS
  Filled 2020-06-21: qty 20

## 2020-06-21 MED ORDER — POTASSIUM CHLORIDE 10 MEQ/100ML IV SOLN
10.0000 meq | INTRAVENOUS | Status: AC
  Administered 2020-06-21 (×2): 10 meq via INTRAVENOUS

## 2020-06-21 MED ORDER — SODIUM CHLORIDE 0.9% FLUSH
10.0000 mL | INTRAVENOUS | Status: DC | PRN
  Administered 2020-06-21: 10 mL
  Filled 2020-06-21: qty 10

## 2020-06-21 MED ORDER — HEPARIN SOD (PORK) LOCK FLUSH 100 UNIT/ML IV SOLN
500.0000 [IU] | Freq: Once | INTRAVENOUS | Status: AC | PRN
  Administered 2020-06-21: 500 [IU]
  Filled 2020-06-21: qty 5

## 2020-06-21 MED ORDER — SODIUM CHLORIDE 0.9 % IV SOLN
Freq: Once | INTRAVENOUS | Status: AC
  Filled 2020-06-21: qty 250

## 2020-06-21 MED ORDER — POTASSIUM CHLORIDE 10 MEQ/100ML IV SOLN
INTRAVENOUS | Status: AC
  Filled 2020-06-21: qty 200

## 2020-06-21 NOTE — Patient Instructions (Addendum)
[facility] Discharge Instructions for Patients Receiving Chemotherapy  Today you received the following chemotherapy agents: atezolizumab.  To help prevent nausea and vomiting after your treatment, we encourage you to take your nausea medication as directed.   If you develop nausea and vomiting that is not controlled by your nausea medication, call the clinic.   BELOW ARE SYMPTOMS THAT SHOULD BE REPORTED IMMEDIATELY:  *FEVER GREATER THAN 100.5 F  *CHILLS WITH OR WITHOUT FEVER  NAUSEA AND VOMITING THAT IS NOT CONTROLLED WITH YOUR NAUSEA MEDICATION  *UNUSUAL SHORTNESS OF BREATH  *UNUSUAL BRUISING OR BLEEDING  TENDERNESS IN MOUTH AND THROAT WITH OR WITHOUT PRESENCE OF ULCERS  *URINARY PROBLEMS  *BOWEL PROBLEMS  UNUSUAL RASH Items with * indicate a potential emergency and should be followed up as soon as possible.  Feel free to call the clinic should you have any questions or concerns. The clinic phone number is [phone].  Please show the [facility] at check-in to the Emergency Department and triage nurse.   Hypokalemia Hypokalemia means that the amount of potassium in the blood is lower than normal. Potassium is a chemical (electrolyte) that helps regulate the amount of fluid in the body. It also stimulates muscle tightening (contraction) and helps nerves work properly. Normally, most of the body's potassium is inside cells, and only a very small amount is in the blood. Because the amount in the blood is so small, minor changes to potassium levels in the blood can be life-threatening. What are the causes? This condition may be caused by:  Antibiotic medicine.  Diarrhea or vomiting. Taking too much of a medicine that helps you have a bowel movement (laxative) can cause diarrhea and lead to hypokalemia.  Chronic kidney disease (CKD).  Medicines that help the body get rid of excess fluid (diuretics).  Eating disorders, such as bulimia.  Low  magnesium levels in the body.  Sweating a lot. What are the signs or symptoms? Symptoms of this condition include:  Weakness.  Constipation.  Fatigue.  Muscle cramps.  Mental confusion.  Skipped heartbeats or irregular heartbeat (palpitations).  Tingling or numbness. How is this diagnosed? This condition is diagnosed with a blood test. How is this treated? This condition may be treated by:  Taking potassium supplements by mouth.  Adjusting the medicines that you take.  Eating more foods that contain a lot of potassium. If your potassium level is very low, you may need to get potassium through an IV and be monitored in the hospital. Follow these instructions at home:   Take over-the-counter and prescription medicines only as told by your health care provider. This includes vitamins and supplements.  Eat a healthy diet. A healthy diet includes fresh fruits and vegetables, whole grains, healthy fats, and lean proteins.  If instructed, eat more foods that contain a lot of potassium. This includes: ? Nuts, such as peanuts and pistachios. ? Seeds, such as sunflower seeds and pumpkin seeds. ? Peas, lentils, and lima beans. ? Whole grain and bran cereals and breads. ? Fresh fruits and vegetables, such as apricots, avocado, bananas, cantaloupe, kiwi, oranges, tomatoes, asparagus, and potatoes. ? Orange juice. ? Tomato juice. ? Red meats. ? Yogurt.  Keep all follow-up visits as told by your health care provider. This is important. Contact a health care provider if you:  Have weakness that gets worse.  Feel your heart pounding or racing.  Vomit.  Have diarrhea.  Have diabetes (diabetes mellitus) and you have trouble keeping your  blood sugar (glucose) in your target range. Get help right away if you:  Have chest pain.  Have shortness of breath.  Have vomiting or diarrhea that lasts for more than 2 days.  Faint. Summary  Hypokalemia means that the amount of  potassium in the blood is lower than normal.  This condition is diagnosed with a blood test.  Hypokalemia may be treated by taking potassium supplements, adjusting the medicines that you take, or eating more foods that are high in potassium.  If your potassium level is very low, you may need to get potassium through an IV and be monitored in the hospital. This information is not intended to replace advice given to you by your health care provider. Make sure you discuss any questions you have with your health care provider. Document Revised: 04/01/2018 Document Reviewed: 04/01/2018 Elsevier Patient Education  [place].

## 2020-06-21 NOTE — Progress Notes (Signed)
CRITICAL VALUE STICKER  CRITICAL VALUE: K+ 2.9  RECEIVER (on-site recipient of call):Kaitlan Bin,RN  DATE & TIME NOTIFIED: 06/21/20 @ [facility] (representative from lab):Pam  MD NOTIFIED: Cassie Hellingoetter, PA  TIME OF NOTIFICATION:0845  RESPONSE: Seeing patient in office currently.

## 2020-07-07 ENCOUNTER — Telehealth: Payer: Self-pay | Admitting: Internal Medicine

## 2020-07-07 NOTE — Telephone Encounter (Signed)
Scheduled per 10/20  los, patient has been called and notified

## 2020-07-12 ENCOUNTER — Encounter: Payer: Self-pay | Admitting: Internal Medicine

## 2020-07-12 ENCOUNTER — Other Ambulatory Visit: Payer: Self-pay | Admitting: Physician Assistant

## 2020-07-12 ENCOUNTER — Inpatient Hospital Stay: Payer: Medicare Other

## 2020-07-12 ENCOUNTER — Inpatient Hospital Stay (HOSPITAL_BASED_OUTPATIENT_CLINIC_OR_DEPARTMENT_OTHER): Payer: Medicare Other | Admitting: Internal Medicine

## 2020-07-12 ENCOUNTER — Other Ambulatory Visit: Payer: Self-pay

## 2020-07-12 ENCOUNTER — Encounter: Payer: Self-pay | Admitting: *Deleted

## 2020-07-12 ENCOUNTER — Telehealth: Payer: Self-pay

## 2020-07-12 ENCOUNTER — Inpatient Hospital Stay: Payer: Medicare Other | Attending: Internal Medicine

## 2020-07-12 VITALS — BP 112/76 | HR 64

## 2020-07-12 VITALS — BP 152/100 | HR 86 | Temp 96.3°F | Resp 18 | Ht 63.0 in | Wt 123.5 lb

## 2020-07-12 DIAGNOSIS — Z5111 Encounter for antineoplastic chemotherapy: Secondary | ICD-10-CM | POA: Diagnosis not present

## 2020-07-12 DIAGNOSIS — E785 Hyperlipidemia, unspecified: Secondary | ICD-10-CM | POA: Diagnosis not present

## 2020-07-12 DIAGNOSIS — C3491 Malignant neoplasm of unspecified part of right bronchus or lung: Secondary | ICD-10-CM | POA: Diagnosis not present

## 2020-07-12 DIAGNOSIS — M810 Age-related osteoporosis without current pathological fracture: Secondary | ICD-10-CM | POA: Insufficient documentation

## 2020-07-12 DIAGNOSIS — F419 Anxiety disorder, unspecified: Secondary | ICD-10-CM | POA: Diagnosis not present

## 2020-07-12 DIAGNOSIS — Z5112 Encounter for antineoplastic immunotherapy: Secondary | ICD-10-CM

## 2020-07-12 DIAGNOSIS — Z85828 Personal history of other malignant neoplasm of skin: Secondary | ICD-10-CM | POA: Diagnosis not present

## 2020-07-12 DIAGNOSIS — R5383 Other fatigue: Secondary | ICD-10-CM | POA: Insufficient documentation

## 2020-07-12 DIAGNOSIS — C7889 Secondary malignant neoplasm of other digestive organs: Secondary | ICD-10-CM | POA: Insufficient documentation

## 2020-07-12 DIAGNOSIS — I10 Essential (primary) hypertension: Secondary | ICD-10-CM | POA: Diagnosis not present

## 2020-07-12 DIAGNOSIS — R531 Weakness: Secondary | ICD-10-CM | POA: Diagnosis not present

## 2020-07-12 DIAGNOSIS — E876 Hypokalemia: Secondary | ICD-10-CM

## 2020-07-12 DIAGNOSIS — Z79899 Other long term (current) drug therapy: Secondary | ICD-10-CM | POA: Diagnosis not present

## 2020-07-12 DIAGNOSIS — C3411 Malignant neoplasm of upper lobe, right bronchus or lung: Secondary | ICD-10-CM | POA: Diagnosis not present

## 2020-07-12 DIAGNOSIS — G894 Chronic pain syndrome: Secondary | ICD-10-CM | POA: Diagnosis not present

## 2020-07-12 LAB — CBC WITH DIFFERENTIAL (CANCER CENTER ONLY)
Abs Immature Granulocytes: 0.02 10*3/uL (ref 0.00–0.07)
Basophils Absolute: 0 10*3/uL (ref 0.0–0.1)
Basophils Relative: 0 %
Eosinophils Absolute: 0.2 10*3/uL (ref 0.0–0.5)
Eosinophils Relative: 3 %
HCT: 34.1 % — ABNORMAL LOW (ref 36.0–46.0)
Hemoglobin: 12.1 g/dL (ref 12.0–15.0)
Immature Granulocytes: 0 %
Lymphocytes Relative: 29 %
Lymphs Abs: 2.7 10*3/uL (ref 0.7–4.0)
MCH: 36.4 pg — ABNORMAL HIGH (ref 26.0–34.0)
MCHC: 35.5 g/dL (ref 30.0–36.0)
MCV: 102.7 fL — ABNORMAL HIGH (ref 80.0–100.0)
Monocytes Absolute: 0.5 10*3/uL (ref 0.1–1.0)
Monocytes Relative: 5 %
Neutro Abs: 6 10*3/uL (ref 1.7–7.7)
Neutrophils Relative %: 63 %
Platelet Count: 253 10*3/uL (ref 150–400)
RBC: 3.32 MIL/uL — ABNORMAL LOW (ref 3.87–5.11)
RDW: 13.1 % (ref 11.5–15.5)
WBC Count: 9.4 10*3/uL (ref 4.0–10.5)
nRBC: 0 % (ref 0.0–0.2)

## 2020-07-12 LAB — CMP (CANCER CENTER ONLY)
ALT: 20 U/L (ref 0–44)
AST: 14 U/L — ABNORMAL LOW (ref 15–41)
Albumin: 3.5 g/dL (ref 3.5–5.0)
Alkaline Phosphatase: 106 U/L (ref 38–126)
Anion gap: 10 (ref 5–15)
BUN: 6 mg/dL — ABNORMAL LOW (ref 8–23)
CO2: 27 mmol/L (ref 22–32)
Calcium: 9.1 mg/dL (ref 8.9–10.3)
Chloride: 103 mmol/L (ref 98–111)
Creatinine: 0.75 mg/dL (ref 0.44–1.00)
GFR, Estimated: >60 mL/min (ref >60)
Glucose, Bld: 94 mg/dL (ref 70–99)
Potassium: 3.2 mmol/L — ABNORMAL LOW (ref 3.5–5.1)
Sodium: 140 mmol/L (ref 135–145)
Total Bilirubin: 0.4 mg/dL (ref 0.3–1.2)
Total Protein: 6.7 g/dL (ref 6.5–8.1)

## 2020-07-12 LAB — TSH: TSH: 1.844 u[IU]/mL (ref 0.308–3.960)

## 2020-07-12 MED ORDER — SODIUM CHLORIDE 0.9% FLUSH
10.0000 mL | INTRAVENOUS | Status: DC | PRN
  Administered 2020-07-12: 10 mL
  Filled 2020-07-12: qty 10

## 2020-07-12 MED ORDER — SODIUM CHLORIDE 0.9 % IV SOLN
1200.0000 mg | Freq: Once | INTRAVENOUS | Status: AC
  Administered 2020-07-12: 1200 mg via INTRAVENOUS
  Filled 2020-07-12: qty 20

## 2020-07-12 MED ORDER — SODIUM CHLORIDE 0.9 % IV SOLN
Freq: Once | INTRAVENOUS | Status: AC
  Filled 2020-07-12: qty 250

## 2020-07-12 MED ORDER — POTASSIUM CHLORIDE 20 MEQ/15ML (10%) PO SOLN: 20.0000 meq | Freq: Every day | ORAL | 0 refills | Status: DC

## 2020-07-12 MED ORDER — HEPARIN SOD (PORK) LOCK FLUSH 100 UNIT/ML IV SOLN
500.0000 [IU] | Freq: Once | INTRAVENOUS | Status: AC | PRN
  Administered 2020-07-12: 500 [IU]
  Filled 2020-07-12: qty 5

## 2020-07-12 NOTE — Patient Instructions (Signed)
[facility] Discharge Instructions for Patients Receiving Chemotherapy  Today you received the following chemotherapy agents: Tecentriq  To help prevent nausea and vomiting after your treatment, we encourage you to take your nausea medication  as prescribed.    If you develop nausea and vomiting that is not controlled by your nausea medication, call the clinic.   BELOW ARE SYMPTOMS THAT SHOULD BE REPORTED IMMEDIATELY:  *FEVER GREATER THAN 100.5 F  *CHILLS WITH OR WITHOUT FEVER  NAUSEA AND VOMITING THAT IS NOT CONTROLLED WITH YOUR NAUSEA MEDICATION  *UNUSUAL SHORTNESS OF BREATH  *UNUSUAL BRUISING OR BLEEDING  TENDERNESS IN MOUTH AND THROAT WITH OR WITHOUT PRESENCE OF ULCERS  *URINARY PROBLEMS  *BOWEL PROBLEMS  UNUSUAL RASH Items with * indicate a potential emergency and should be followed up as soon as possible.  Feel free to call the clinic should you have any questions or concerns. The clinic phone number is [phone].  Please show the [facility] at check-in to the Emergency Department and triage nurse.

## 2020-07-12 NOTE — Progress Notes (Signed)
Patient provided with number to schedule brain MRI with radiology. Patient stated that she would prefer to schedule in January due to insurance purposes. Patient informed that Dr. Julien Nordmann may suggest getting it scheduled sooner. Patient amenable to whatever Dr. Julien Nordmann recommends.

## 2020-07-12 NOTE — Progress Notes (Signed)
I spoke with Debra Kidd today.  She is currently on chemo and IO therapy for small cell lung cancer.  Today patient complains of headaches.  Dr. Julien Nordmann is updated and mri brain was ordered. She is getting treatment today and will get a call with her scan appt.

## 2020-07-12 NOTE — Progress Notes (Signed)
[facility] [phone]   [phone]  OFFICE PROGRESS NOTE  [name], MD [address] [place] [address]  DIAGNOSIS: Extensive stage (T2a, N3, [name], large right anterior mediastinal and supraclavicular lymphadenopathy as well as pancreatic and splenic metastasis diagnosed in September 2017.  The patient had disease progression in October 2019.  PRIOR THERAPY:  1) Systemic chemotherapy was carboplatin for AUC of 5 on day 1 and etoposide 100 MG/M2 on days 1, 2 and 3 with Neulasta support. Status post 6 cycles with significant response of her disease. 2) Prophylactic cranial irradiation under the care of Dr. Sondra Come on 12/02/2016. 3) stereotactic radiotherapy to the recurrent right upper lobe pulmonary nodule under the care of Dr. Sondra Come completed November 10, 2017. 4) Retreatment with systemic chemotherapy with carboplatin for AUC of 5 on day 1 and etoposide 100 mg/M2 on days 1, 2 and 3 as well as Tecentriq (Atezolizumab) 1200 mg IV every 3 weeks with Neulasta support.  First dose June 22, 2018 for disease recurrence.  Status post 5 cycles.  Starting from cycle #2 her dose of carboplatin will be reduced to AUC of 4 and etoposide 80 mg/M2 on days 1, 2 and 3 in addition to the regular dose of Tecentriq.  CURRENT THERAPY: Maintenance treatment with single agent Tecentriq 1200 mg IV every 3 weeks.  Status post 30 cycles.  INTERVAL HISTORY: Debra Kidd 59 y.o. female returns to the clinic today for follow-up visit.  The patient continues to complain of increasing fatigue and weakness as well as shortness of breath with exertion.  She also started having recurrent headache since September 2021 after her Covid booster vaccine.  She denied having any chest pain, cough or hemoptysis.  She denied having any recent weight loss or night sweats.  She has no nausea, vomiting, diarrhea or  constipation.  She is here today for evaluation before starting cycle #35 of her treatment with Tecentriq.  MEDICAL HISTORY: Past Medical History:  Diagnosis Date  . 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 Blood type, Rh positive   . Cancer ([facility])   . Chronic pain syndrome   . Depression 08/07/2016  . Encounter for antineoplastic chemotherapy 07/17/2016  . History of external beam radiation therapy 11/19/16-12/02/16   brain 25 Gy in 10 fractions  . Hyperlipidemia   . Hypertension   . Hypertension 08/07/2016  . Osteoporosis   . Small cell lung cancer ([facility]) dx'd 05/2016    ALLERGIES:  is allergic to codeine, motrin [ibuprofen], thiazide-type diuretics, and vicodin [hydrocodone-acetaminophen].  MEDICATIONS:  Current Outpatient Medications  Medication Sig Dispense Refill  . clopidogrel (PLAVIX) 75 MG tablet Take 1 tablet (75 mg total) by mouth daily. 30 tablet 0  . diazepam (VALIUM) 5 MG tablet Take 5 mg by mouth 3 (three) times daily as needed for anxiety.   2  . hydrOXYzine (ATARAX/VISTARIL) 10 MG tablet Take 1 tablet (10 mg total) by mouth 3 (three) times daily as needed. 90 tablet 1  . lidocaine-prilocaine (EMLA) cream Apply 1 application topically as needed. 30 g 0  . oxyCODONE-acetaminophen (PERCOCET) 5-325 MG tablet Take 1 tablet by mouth every 6 (six) hours as needed for severe pain. 30 tablet 0  . polyethylene glycol (MIRALAX / GLYCOLAX) packet Take 17 g by mouth daily. 14 each 0  . potassium chloride 20 MEQ/15ML (10%)  SOLN Take 15 mLs (20 mEq total) by mouth daily. Please take 15 mL by mouth twice daily for 7 days, followed by 15 mL once daily 420 mL 0  . prochlorperazine (COMPAZINE) 10 MG tablet Take 1 tablet (10 mg total) by mouth every 6 (six) hours as needed for nausea or vomiting. 30 tablet 0  . traMADol (ULTRAM) 50 MG tablet Take 50 mg by mouth every 6 (six) hours as needed for moderate pain.     . Vitamin D, Ergocalciferol, (DRISDOL) 1.25 MG (50000 UT) CAPS capsule  Take 1 capsule (50,000 Units total) by mouth every 7 (seven) days. (Patient not taking: Reported on 06/21/2020) 5 capsule 0   No current facility-administered medications for this visit.    SURGICAL HISTORY:  Past Surgical History:  Procedure Laterality Date  . ABDOMINAL HYSTERECTOMY  1981  . APPENDECTOMY     at age 66  . EYE SURGERY     left  . IR GENERIC HISTORICAL  06/03/2016   IR FLUORO GUIDE PORT INSERTION RIGHT 06/03/2016 [facility]INTERV RAD  . IR GENERIC HISTORICAL  06/03/2016   IR US GUIDE VASC ACCESS RIGHT 06/03/2016 [facility]INTER[facility]  . SKIN CANCER EXCISION     basal cell carcinoma L side of face    REVIEW OF SYSTEMS:  A comprehensive review of systems was negative except for: Constitutional: positive for fatigue Respiratory: positive for dyspnea on exertion Neurological: positive for headaches Behavioral/Psych: positive for anxiety   PHYSICAL EXAMINATION: General appearance: alert, cooperative and no distress Head: Normocephalic, without obvious abnormality, atraumatic Neck: no adenopathy, no JVD, supple, symmetrical, trachea midline and thyroid not enlarged, symmetric, no tenderness/mass/nodules Lymph nodes: Cervical, supraclavicular, and axillary nodes normal. Resp: clear to auscultation bilaterally Back: symmetric, no curvature. ROM normal. No CVA tenderness. Cardio: regular rate and rhythm, S1, S2 normal, no murmur, click, rub or gallop GI: soft, non-tender; bowel sounds normal; no masses,  no organomegaly Extremities: extremities normal, atraumatic, no cyanosis or edema  ECOG PERFORMANCE STATUS: 1 - Symptomatic but completely ambulatory  Blood pressure (!) 152/100, pulse 86, temperature (!) 96.3 F (35.7 C), temperature source Tympanic, resp. rate 18, height 5\' 3"  (1.6 m), weight 123 lb 8 oz (56 kg), SpO2 100 %.  LABORATORY DATA: Lab Results  Component Value Date   WBC 9.4 07/12/2020   HGB 12.1 07/12/2020   HCT 34.1 (L) 07/12/2020   MCV 102.7 (H) 07/12/2020   PLT 253  07/12/2020      Chemistry      Component Value Date/Time   NA 135 06/21/2020 0800   NA 141 08/01/2017 0835   K 2.9 (LL) 06/21/2020 0800   K 3.5 08/01/2017 0835   CL 100 06/21/2020 0800   CO2 27 06/21/2020 0800   CO2 25 08/01/2017 0835   BUN 8 06/21/2020 0800   BUN 8.0 08/01/2017 0835   CREATININE 0.99 06/21/2020 0800   CREATININE 0.9 08/01/2017 0835      Component Value Date/Time   CALCIUM 9.0 06/21/2020 0800   CALCIUM 9.9 08/01/2017 0835   ALKPHOS 118 06/21/2020 0800   ALKPHOS 142 08/01/2017 0835   AST 14 (L) 06/21/2020 0800   AST 15 08/01/2017 0835   ALT 13 06/21/2020 0800   ALT 19 08/01/2017 0835   BILITOT 0.5 06/21/2020 0800   BILITOT 0.35 08/01/2017 0835       RADIOGRAPHIC STUDIES: No results found.  ASSESSMENT AND PLAN:  This is a very pleasant 59 years old white female with extensive stage small cell lung  cancer status post systemic chemotherapy with carbo platinum and etoposide for 6 cycles and the patient rotated her treatment well except for chemotherapy-induced anemia and requirement for PRBCs and platelet transfusion. She had significant improvement in her disease with the chemotherapy. She also had prophylactic cranial irradiation. She also underwent stereotactic radiotherapy to progressive right upper lobe pulmonary nodule. The patient has been in observation for close to 2 years. Repeat CT scan of the chest, abdomen and pelvis performed recently showed evidence for disease progression in the abdomen. The patient was started on systemic chemotherapy again with carboplatin, etoposide and Tecentriq status post 34 cycles.  Starting from cycle #5 the patient is treated with maintenance single agent Tecentriq. The patient has been tolerating this treatment well with no concerning adverse effects. I recommended for her to proceed with cycle #35 today as planned. For the anxiety and stress she is currently on Valium by her primary care physician. For the  persistent pruritus, I will give her a refill of Atarax 10 mg p.o. 3 times daily as needed. I will see the patient back for follow-up visit in 3 weeks before the next cycle of her treatment. For the current headache in a patient with small cell lung cancer, I will order MRI of the brain to rule out brain metastasis. She was advised to call immediately if she has any concerning symptoms in the interval.  The patient voices understanding of current disease status and treatment options and is in agreement with the current care plan. All questions were answered. The patient knows to call the clinic with any problems, questions or concerns. We can certainly see the patient much sooner if necessary.  Disclaimer: This note was dictated with voice recognition software. Similar sounding words can inadvertently be transcribed and may not be corrected upon review.

## 2020-07-12 NOTE — Telephone Encounter (Signed)
The pt expressed a desire to hold off on her Brain MRI until January 2022 while she was being infused today. I called and spoke with pt and advised her scheduling is at her discretion, however, per Dr. Julien Nordmann and Rubin Payor PA-C this is not recommended in the event she has brain mets. Pt expressed understanding of this information and she will not change her 11/20 radiology appt.

## 2020-07-22 ENCOUNTER — Ambulatory Visit (HOSPITAL_COMMUNITY)
Admission: RE | Admit: 2020-07-22 | Discharge: 2020-07-22 | Disposition: A | Discharge status: Home / Self Care | Payer: Medicare Other | Source: Ambulatory Visit | Attending: Internal Medicine | Admitting: Internal Medicine

## 2020-07-22 ENCOUNTER — Other Ambulatory Visit: Payer: Self-pay

## 2020-07-22 DIAGNOSIS — C3491 Malignant neoplasm of unspecified part of right bronchus or lung: Secondary | ICD-10-CM | POA: Insufficient documentation

## 2020-07-22 MED ORDER — HEPARIN SOD (PORK) LOCK FLUSH 100 UNIT/ML IV SOLN
INTRAVENOUS | Status: AC
  Filled 2020-07-22: qty 5

## 2020-07-22 MED ORDER — GADOBUTROL 1 MMOL/ML IV SOLN
5.0000 mL | Freq: Once | INTRAVENOUS | Status: AC | PRN
  Administered 2020-07-22: 5 mL via INTRAVENOUS

## 2020-07-31 NOTE — Progress Notes (Signed)
[place] OFFICE PROGRESS NOTE  [name], MD [address] [place] [address]  DIAGNOSIS: Extensive stage (T2a, N3, [name], large right anterior mediastinal and supraclavicular lymphadenopathy as well as pancreatic and splenic metastasis diagnosed in September 2017. The patient had disease progression in October 2019.  PRIOR THERAPY: 1) Systemic chemotherapy was carboplatin for AUC of 5 on day 1 and etoposide 100 MG/M2 on days 1, 2 and 3 with Neulasta support. Status post 6 cycles with significant response of her disease. 2) Prophylactic cranial irradiation under the care of Dr. Sondra Come on 12/02/2016. 3) stereotactic radiotherapy to the recurrent right upper lobe pulmonary nodule under the care of Dr. Sondra Come completed November 10, 2017. 4)Retreatment with systemic chemotherapy with carboplatin for AUC of 5 on day 1 and etoposide 100 mg/M2 on days 1, 2 and 3 as well as Tecentriq (Atezolizumab) 1200 mg IV every 3 weeks with Neulasta support. First dose June 22, 2018 for disease recurrence. Status post 5 cycles. Starting from cycle #2 her dose of carboplatin will be reduced to AUC of 4 and etoposide 80 mg/M2 on days 1, 2 and 3 in addition to the regular dose of Tecentriq  CURRENT THERAPY: Maintenance treatment with single agent Tecentriq 1200 mg IV every 3 weeks. Status post35cycles  INTERVAL HISTORY: Debra Kidd 59 y.o. female returns to the clinic for a follow up visit. The patient is feeling well today without any concerning complaints. The patient continues to tolerate treatment with immunotherapy with Tecentriqwell without any adverse effects except occasional itching for which she uses atarax.Her itching has been better recentlythough.Denies any fever, chills, or night sweats.She lost about 3 lb since her last appointment due to not eating as much. She uses carnation instant breakfast but does  not drink boost or ensure.Denies any chest pain or hemoptysisbut endorses her baseline coughand dyspnea on exertion which is unchanged.Denies any vomiting but reports mild nausea. She denies constipation and diarrhea.Denies any visual changes. She had been having recurrent headaches since getting her COVID-19 booster shot. She had an MRI performed to rule out metastatic disease to the brain. She has a history of CVA in the past and has been seen by vascular for carotid stenosis. The patient is here for evaluation prior to starting cycle #36.    MEDICAL HISTORY: Past Medical History:  Diagnosis Date  . 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 Blood type, Rh positive   . Cancer ([place])   . Chronic pain syndrome   . Depression 08/07/2016  . Encounter for antineoplastic chemotherapy 07/17/2016  . History of external beam radiation therapy 11/19/16-12/02/16   brain 25 Gy in 10 fractions  . Hyperlipidemia   . Hypertension   . Hypertension 08/07/2016  . Osteoporosis   . Small cell lung cancer ([place]) dx'd 05/2016    ALLERGIES:  is allergic to codeine, motrin [ibuprofen], thiazide-type diuretics, and vicodin [hydrocodone-acetaminophen].  MEDICATIONS:  Current Outpatient Medications  Medication Sig Dispense Refill  . clopidogrel (PLAVIX) 75 MG tablet Take 1 tablet (75 mg total) by mouth daily. 30 tablet 0  . diazepam (VALIUM) 5 MG tablet Take 5 mg by mouth 3 (three) times daily as needed for anxiety.   2  . hydrOXYzine (ATARAX/VISTARIL) 10 MG tablet Take 1 tablet (10 mg total) by mouth 3 (three) times daily as needed. 90 tablet 1  . lidocaine-prilocaine (EMLA) cream Apply 1  application topically as needed. 30 g 0  . oxyCODONE-acetaminophen (PERCOCET) 5-325 MG tablet Take 1 tablet by mouth every 6 (six) hours as needed for severe pain. 30 tablet 0  . potassium chloride 20 MEQ/15ML (10%) SOLN Take 15 mLs (20 mEq total) by mouth daily. Please take 15 mL by mouth once daily for 7 days. 105 mL  0  . prochlorperazine (COMPAZINE) 10 MG tablet Take 1 tablet (10 mg total) by mouth every 6 (six) hours as needed for nausea or vomiting. 30 tablet 2  . traMADol (ULTRAM) 50 MG tablet Take 50 mg by mouth every 6 (six) hours as needed for moderate pain.      No current facility-administered medications for this visit.    SURGICAL HISTORY:  Past Surgical History:  Procedure Laterality Date  . ABDOMINAL HYSTERECTOMY  1981  . APPENDECTOMY     at age 43  . EYE SURGERY     left  . IR GENERIC HISTORICAL  06/03/2016   IR FLUORO GUIDE PORT INSERTION RIGHT 06/03/2016 [facility]  . IR GENERIC HISTORICAL  06/03/2016   IR US GUIDE VASC ACCESS RIGHT 06/03/2016 [facility]  . SKIN CANCER EXCISION     basal cell carcinoma L side of face    REVIEW OF SYSTEMS:   Review of Systems Constitutional: Negative for appetite change, chills, fatigue, fever and unexpected weight change.  HENT: Negative for mouth sores, nosebleeds, sore throat and trouble swallowing.  Eyes: Negative for eye problems and icterus.  Respiratory:Positive for baseline coughand shortness of breath with exertion.Negative for hemoptysis and wheezing.  Cardiovascular: Negative for chest pain and leg swelling.  Gastrointestinal:Positive for intermittent nausea.Negative for abdominal pain, constipation, nausea and vomiting.  Genitourinary: Negative for bladder incontinence, difficulty urinating, dysuria, frequency and hematuria.  Musculoskeletal: Negative for back pain, gait problem, neck pain and neck stiffness.  Skin:Positive for itching(improved from prior).Negative for rash.  Neurological:Positive for limping secondary to history of stroke.Negative for dizziness, extremity weakness, headaches, [name], depression and sleep disturbance. The patient is not  nervous/anxious.   PHYSICAL EXAMINATION:  Blood pressure (!) 154/85, pulse 89, temperature 99 F (37.2 C), temperature source Tympanic, resp. rate 18, height 5\' 3"  (1.6 m), weight 120 lb 14.4 oz (54.8 kg), SpO2 100 %.  ECOG PERFORMANCE STATUS: 1 - Symptomatic but completely ambulatory  Physical Exam  Constitutional: Oriented to person, place, and time andchronically ill appearing femaleand in no distress.  HENT:  Head: Normocephalic and atraumatic.  Mouth/Throat: Oropharynx is clear and moist. No oropharyngeal exudate.  Eyes: Conjunctivae are normal. Right eye exhibits no discharge. Left eye exhibits no discharge. No scleral icterus.  Neck: Normal range of motion. Neck supple.  Cardiovascular: Normal rate, regular rhythm, normal heart sounds and intact distal pulses.  Pulmonary/Chest: Effort normal and breath sounds normal. No respiratory distress. No wheezes. No rales.  Abdominal: Soft. Bowel sounds are normal. Exhibits no distension and no mass. There is no tenderness.  Musculoskeletal: Normal range of motion. Exhibits no edema.  Lymphadenopathy:  No cervical adenopathy.  Neurological: Alert and oriented to person, place, and time. Exhibits normal muscle tone. Positive for limping due to history of stroke. Skin: Skin is warm and dry. No rash noted. Not diaphoretic. No erythema. No pallor.  Psychiatric: Mood, memory and judgment normal.  Vitals reviewed.  LABORATORY DATA: Lab Results  Component Value Date   WBC 8.1 08/02/2020   HGB 12.4 08/02/2020  HCT 36.0 08/02/2020   MCV 105.9 (H) 08/02/2020   PLT 269 08/02/2020      Chemistry      Component Value Date/Time   NA 140 08/02/2020 0853   NA 141 08/01/2017 0835   K 3.2 (L) 08/02/2020 0853   K 3.5 08/01/2017 0835   CL 106 08/02/2020 0853   CO2 26 08/02/2020 0853   CO2 25 08/01/2017 0835   BUN 5 (L) 08/02/2020 0853   BUN 8.0 08/01/2017 0835   CREATININE 0.80 08/02/2020 0853   CREATININE 0.9 08/01/2017 0835       Component Value Date/Time   CALCIUM 8.8 (L) 08/02/2020 0853   CALCIUM 9.9 08/01/2017 0835   ALKPHOS 109 08/02/2020 0853   ALKPHOS 142 08/01/2017 0835   AST 14 (L) 08/02/2020 0853   AST 15 08/01/2017 0835   ALT 11 08/02/2020 0853   ALT 19 08/01/2017 0835   BILITOT 0.3 08/02/2020 0853   BILITOT 0.35 08/01/2017 0835       RADIOGRAPHIC STUDIES:  MR BRAIN W WO CONTRAST  Result Date: 07/23/2020 CLINICAL DATA:  Small cell lung cancer monitoring.  New headache EXAM: MRI HEAD WITHOUT AND WITH CONTRAST TECHNIQUE: Multiplanar, multiecho pulse sequences of the brain and surrounding structures were obtained without and with intravenous contrast. CONTRAST:  41mL GADAVIST GADOBUTROL 1 MMOL/ML IV SOLN COMPARISON:  01/29/2019 FINDINGS: Brain: No abnormal enhancement to suggest metastatic disease or subacute infarct. Advanced chronic small vessel ischemia with confluent gliosis in the cerebral white matter. Remote right-sided infarct with cortical spinal wallerian degeneration seen in the thinned right brainstem. Remote right occipital cortex infarct with hemosiderin staining. Generalized brain atrophy. No hydrocephalus or collection Vascular: Absent hypoplastic right vertebral flow void, a change from 2020. 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 Skull and upper cervical spine: No evidence of bone lesion Sinuses/Orbits: Negative IMPRESSION: 1. No evidence of metastatic disease. 2. Remote right PCA territory infarct. Wallerian degeneration has developed in the right cortical spinal radiations since 2020. 3. 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 Evidence of distal basilar stenosis which was not seen on a 2020 MRA. 4. Confluent ischemic gliosis in the cerebral white matter. Electronically Signed   By: Monte Fantasia M.D.   On: 07/23/2020 09:29     ASSESSMENT/PLAN:  This is a very pleasant 59 year old Caucasian  female with extensive stage small cell lung cancer. She presented with a right upper lobe lung mass, large right anterior mediastinal and supraclavicular lymphadenopathy as well as a pancreatic andsplenic metastasis. She was diagnosed in September 2017.  The patient underwent systemic chemotherapy with carboplatin and etoposide. She is status post 6 cycles. She tolerated treatment well except for chemotherapy-induced anemia which required pRBCs and platelet transfusions. She had a significant improvement of her disease with chemotherapy.The patientthenunderwent prophylactic cranial irradiation. She then underwent stereotactic radiotherapy to the right upper lobe and pulmonary nodule.  She had been on observation for 2 years before showing evidence of disease progression.   She recently hadbeen started on systemic chemotherapy with carboplatin, etoposide, and Tecentriq. Starting from cycle #5, she has been on maintenance single agent Tecentriq. She has been tolerating treatment well.She is status post35cycles of single agent Tecentriq.  The patient had an MRI performed for her headaches which was negative for metastatic disease to the brain but showed her prior CVA and slow or absent flow in the hypoplastic right vertebral artery. She was seen  by vascular within the last year for carotid stenosis. I strongly encouraged the patient to follow up with them to discuss her options, especially given her hx of stroke. It sounds like intervention was offered in the past but she declined. The scan notes that the distal basilar stenosis is new from her prior 2020. She was given the name and number to the office where she was seen in the past and instructed to call.   She will proceed with cycle #36 today as scheduled.   I will see her back for a follow up visit in 3 weeks for evaluation before starting cycle #37. I will arrange for a restaging CT scan of the chest, abdomen, and pelvis prior to  that visit to restage her disease.   I have sent a refill of her potassium supplements and compazine.   She will continue to take atarax for itching if needed.   The patient was advised to call immediately if she has any concerning symptoms in the interval. The patient voices understanding of current disease status and treatment options and is in agreement with the current care plan. All questions were answered. The patient knows to call the clinic with any problems, questions or concerns. We can certainly see the patient much sooner if necessary         Orders Placed This Encounter  Procedures  . CT Chest W Contrast    Standing Status:   Future    Standing Expiration Date:   08/02/2021    Order Specific Question:   If indicated for the ordered procedure, I authorize the administration of contrast media per Radiology protocol    Answer:   Yes    Order Specific Question:   Preferred imaging location?    Answer:   [facility]  . CT Abdomen Pelvis W Contrast    Standing Status:   Future    Standing Expiration Date:   08/02/2021    Order Specific Question:   If indicated for the ordered procedure, I authorize the administration of contrast media per Radiology protocol    Answer:   Yes    Order Specific Question:   Preferred imaging location?    Answer:   [facility]    Order Specific Question:   Is Oral Contrast requested for this exam?    Answer:   Yes, Per Radiology protocol     Nyzaiah Kai L Nickalas Mccarrick, PA-C 08/02/20

## 2020-08-02 ENCOUNTER — Inpatient Hospital Stay: Payer: Medicare Other

## 2020-08-02 ENCOUNTER — Other Ambulatory Visit: Payer: Self-pay

## 2020-08-02 ENCOUNTER — Inpatient Hospital Stay (HOSPITAL_BASED_OUTPATIENT_CLINIC_OR_DEPARTMENT_OTHER): Payer: Medicare Other | Admitting: Physician Assistant

## 2020-08-02 ENCOUNTER — Encounter: Payer: Self-pay | Admitting: Physician Assistant

## 2020-08-02 ENCOUNTER — Inpatient Hospital Stay: Payer: Medicare Other | Attending: Internal Medicine

## 2020-08-02 VITALS — BP 154/85 | HR 89 | Temp 99.0°F | Resp 18 | Ht 63.0 in | Wt 120.9 lb

## 2020-08-02 DIAGNOSIS — E876 Hypokalemia: Secondary | ICD-10-CM

## 2020-08-02 DIAGNOSIS — G894 Chronic pain syndrome: Secondary | ICD-10-CM | POA: Insufficient documentation

## 2020-08-02 DIAGNOSIS — I651 Occlusion and stenosis of basilar artery: Secondary | ICD-10-CM | POA: Insufficient documentation

## 2020-08-02 DIAGNOSIS — Z79899 Other long term (current) drug therapy: Secondary | ICD-10-CM | POA: Diagnosis not present

## 2020-08-02 DIAGNOSIS — R5383 Other fatigue: Secondary | ICD-10-CM

## 2020-08-02 DIAGNOSIS — C778 Secondary and unspecified malignant neoplasm of lymph nodes of multiple regions: Secondary | ICD-10-CM | POA: Diagnosis not present

## 2020-08-02 DIAGNOSIS — C3411 Malignant neoplasm of upper lobe, right bronchus or lung: Secondary | ICD-10-CM | POA: Insufficient documentation

## 2020-08-02 DIAGNOSIS — R110 Nausea: Secondary | ICD-10-CM | POA: Diagnosis not present

## 2020-08-02 DIAGNOSIS — C3491 Malignant neoplasm of unspecified part of right bronchus or lung: Secondary | ICD-10-CM

## 2020-08-02 DIAGNOSIS — C7889 Secondary malignant neoplasm of other digestive organs: Secondary | ICD-10-CM | POA: Diagnosis not present

## 2020-08-02 DIAGNOSIS — Z8673 Personal history of transient ischemic attack (TIA), and cerebral infarction without residual deficits: Secondary | ICD-10-CM | POA: Insufficient documentation

## 2020-08-02 DIAGNOSIS — I10 Essential (primary) hypertension: Secondary | ICD-10-CM | POA: Diagnosis not present

## 2020-08-02 DIAGNOSIS — E785 Hyperlipidemia, unspecified: Secondary | ICD-10-CM | POA: Insufficient documentation

## 2020-08-02 DIAGNOSIS — Z5112 Encounter for antineoplastic immunotherapy: Secondary | ICD-10-CM | POA: Insufficient documentation

## 2020-08-02 DIAGNOSIS — Z85828 Personal history of other malignant neoplasm of skin: Secondary | ICD-10-CM | POA: Diagnosis not present

## 2020-08-02 DIAGNOSIS — I6782 Cerebral ischemia: Secondary | ICD-10-CM | POA: Diagnosis not present

## 2020-08-02 LAB — CBC WITH DIFFERENTIAL (CANCER CENTER ONLY)
Abs Immature Granulocytes: 0.01 10*3/uL (ref 0.00–0.07)
Basophils Absolute: 0 10*3/uL (ref 0.0–0.1)
Basophils Relative: 1 %
Eosinophils Absolute: 0.3 10*3/uL (ref 0.0–0.5)
Eosinophils Relative: 4 %
HCT: 36 % (ref 36.0–46.0)
Hemoglobin: 12.4 g/dL (ref 12.0–15.0)
Immature Granulocytes: 0 %
Lymphocytes Relative: 27 %
Lymphs Abs: 2.2 10*3/uL (ref 0.7–4.0)
MCH: 36.5 pg — ABNORMAL HIGH (ref 26.0–34.0)
MCHC: 34.4 g/dL (ref 30.0–36.0)
MCV: 105.9 fL — ABNORMAL HIGH (ref 80.0–100.0)
Monocytes Absolute: 0.5 10*3/uL (ref 0.1–1.0)
Monocytes Relative: 6 %
Neutro Abs: 5.2 10*3/uL (ref 1.7–7.7)
Neutrophils Relative %: 62 %
Platelet Count: 269 10*3/uL (ref 150–400)
RBC: 3.4 MIL/uL — ABNORMAL LOW (ref 3.87–5.11)
RDW: 12.4 % (ref 11.5–15.5)
WBC Count: 8.1 10*3/uL (ref 4.0–10.5)
nRBC: 0 % (ref 0.0–0.2)

## 2020-08-02 LAB — CMP (CANCER CENTER ONLY)
ALT: 11 U/L (ref 0–44)
AST: 14 U/L — ABNORMAL LOW (ref 15–41)
Albumin: 3.4 g/dL — ABNORMAL LOW (ref 3.5–5.0)
Alkaline Phosphatase: 109 U/L (ref 38–126)
Anion gap: 8 (ref 5–15)
BUN: 5 mg/dL — ABNORMAL LOW (ref 8–23)
CO2: 26 mmol/L (ref 22–32)
Calcium: 8.8 mg/dL — ABNORMAL LOW (ref 8.9–10.3)
Chloride: 106 mmol/L (ref 98–111)
Creatinine: 0.8 mg/dL (ref 0.44–1.00)
GFR, Estimated: >60 mL/min (ref >60)
Glucose, Bld: 106 mg/dL — ABNORMAL HIGH (ref 70–99)
Potassium: 3.2 mmol/L — ABNORMAL LOW (ref 3.5–5.1)
Sodium: 140 mmol/L (ref 135–145)
Total Bilirubin: 0.3 mg/dL (ref 0.3–1.2)
Total Protein: 6.6 g/dL (ref 6.5–8.1)

## 2020-08-02 LAB — TSH: TSH: 2.038 u[IU]/mL (ref 0.308–3.960)

## 2020-08-02 MED ORDER — POTASSIUM CHLORIDE 20 MEQ/15ML (10%) PO SOLN: 20.0000 meq | Freq: Every day | ORAL | 0 refills | Status: DC

## 2020-08-02 MED ORDER — HEPARIN SOD (PORK) LOCK FLUSH 100 UNIT/ML IV SOLN
500.0000 [IU] | Freq: Once | INTRAVENOUS | Status: AC | PRN
  Administered 2020-08-02: 500 [IU]
  Filled 2020-08-02: qty 5

## 2020-08-02 MED ORDER — PROCHLORPERAZINE MALEATE 10 MG PO TABS: 10.0000 mg | ORAL_TABLET | Freq: Four times a day (QID) | ORAL | 2 refills | Status: DC | PRN

## 2020-08-02 MED ORDER — SODIUM CHLORIDE 0.9% FLUSH
10.0000 mL | INTRAVENOUS | Status: DC | PRN
  Administered 2020-08-02: 10 mL
  Filled 2020-08-02: qty 10

## 2020-08-02 MED ORDER — SODIUM CHLORIDE 0.9 % IV SOLN
1200.0000 mg | Freq: Once | INTRAVENOUS | Status: AC
  Administered 2020-08-02: 1200 mg via INTRAVENOUS
  Filled 2020-08-02: qty 20

## 2020-08-02 MED ORDER — SODIUM CHLORIDE 0.9 % IV SOLN
Freq: Once | INTRAVENOUS | Status: AC
  Filled 2020-08-02: qty 250

## 2020-08-02 NOTE — Patient Instructions (Signed)
[facility] Discharge Instructions for Patients Receiving Chemotherapy  Today you received the following chemotherapy agents: Tecentriq  To help prevent nausea and vomiting after your treatment, we encourage you to take your nausea medication as directed.   If you develop nausea and vomiting that is not controlled by your nausea medication, call the clinic.   BELOW ARE SYMPTOMS THAT SHOULD BE REPORTED IMMEDIATELY:  *FEVER GREATER THAN 100.5 F  *CHILLS WITH OR WITHOUT FEVER  NAUSEA AND VOMITING THAT IS NOT CONTROLLED WITH YOUR NAUSEA MEDICATION  *UNUSUAL SHORTNESS OF BREATH  *UNUSUAL BRUISING OR BLEEDING  TENDERNESS IN MOUTH AND THROAT WITH OR WITHOUT PRESENCE OF ULCERS  *URINARY PROBLEMS  *BOWEL PROBLEMS  UNUSUAL RASH Items with * indicate a potential emergency and should be followed up as soon as possible.  Feel free to call the clinic should you have any questions or concerns. The clinic phone number is [phone].  Please show the CHEMO ALERT CARD at check-in to the Emergency Department and triage nurse.   

## 2020-08-17 ENCOUNTER — Telehealth: Payer: Self-pay

## 2020-08-17 NOTE — Telephone Encounter (Signed)
Pt called stating she was told her CT Scan would be $800 so she cancelled the appt. Pt has Medicare and wants to know if she can wait until January 2022 to have the scan. I discussed with Dr. Julien Nordmann who advised she can wait until January. Pt has been advised, expressed understanding of this and r/s it for 09/07/20.

## 2020-08-21 ENCOUNTER — Ambulatory Visit (HOSPITAL_COMMUNITY): Payer: Medicare Other

## 2020-08-23 ENCOUNTER — Inpatient Hospital Stay: Payer: Medicare Other

## 2020-08-23 ENCOUNTER — Other Ambulatory Visit: Payer: Self-pay

## 2020-08-23 ENCOUNTER — Other Ambulatory Visit: Payer: Self-pay | Admitting: Physician Assistant

## 2020-08-23 ENCOUNTER — Encounter: Payer: Self-pay | Admitting: Internal Medicine

## 2020-08-23 ENCOUNTER — Inpatient Hospital Stay (HOSPITAL_BASED_OUTPATIENT_CLINIC_OR_DEPARTMENT_OTHER): Payer: Medicare Other | Admitting: Internal Medicine

## 2020-08-23 VITALS — BP 146/82 | HR 91 | Temp 97.8°F | Resp 20 | Ht 63.0 in | Wt 124.2 lb

## 2020-08-23 DIAGNOSIS — C3491 Malignant neoplasm of unspecified part of right bronchus or lung: Secondary | ICD-10-CM

## 2020-08-23 DIAGNOSIS — Z5112 Encounter for antineoplastic immunotherapy: Secondary | ICD-10-CM | POA: Diagnosis not present

## 2020-08-23 DIAGNOSIS — R5383 Other fatigue: Secondary | ICD-10-CM

## 2020-08-23 DIAGNOSIS — E876 Hypokalemia: Secondary | ICD-10-CM

## 2020-08-23 LAB — CBC WITH DIFFERENTIAL (CANCER CENTER ONLY)
Abs Immature Granulocytes: 0.03 10*3/uL (ref 0.00–0.07)
Basophils Absolute: 0 10*3/uL (ref 0.0–0.1)
Basophils Relative: 1 %
Eosinophils Absolute: 0.3 10*3/uL (ref 0.0–0.5)
Eosinophils Relative: 3 %
HCT: 36.1 % (ref 36.0–46.0)
Hemoglobin: 12.4 g/dL (ref 12.0–15.0)
Immature Granulocytes: 0 %
Lymphocytes Relative: 22 %
Lymphs Abs: 1.8 10*3/uL (ref 0.7–4.0)
MCH: 36.2 pg — ABNORMAL HIGH (ref 26.0–34.0)
MCHC: 34.3 g/dL (ref 30.0–36.0)
MCV: 105.2 fL — ABNORMAL HIGH (ref 80.0–100.0)
Monocytes Absolute: 0.5 10*3/uL (ref 0.1–1.0)
Monocytes Relative: 6 %
Neutro Abs: 5.5 10*3/uL (ref 1.7–7.7)
Neutrophils Relative %: 68 %
Platelet Count: 240 10*3/uL (ref 150–400)
RBC: 3.43 MIL/uL — ABNORMAL LOW (ref 3.87–5.11)
RDW: 11.8 % (ref 11.5–15.5)
WBC Count: 8.2 10*3/uL (ref 4.0–10.5)
nRBC: 0 % (ref 0.0–0.2)

## 2020-08-23 LAB — CMP (CANCER CENTER ONLY)
ALT: 21 U/L (ref 0–44)
AST: 16 U/L (ref 15–41)
Albumin: 3.4 g/dL — ABNORMAL LOW (ref 3.5–5.0)
Alkaline Phosphatase: 109 U/L (ref 38–126)
Anion gap: 10 (ref 5–15)
BUN: <4 mg/dL — ABNORMAL LOW (ref 8–23)
CO2: 24 mmol/L (ref 22–32)
Calcium: 8.7 mg/dL — ABNORMAL LOW (ref 8.9–10.3)
Chloride: 107 mmol/L (ref 98–111)
Creatinine: 0.78 mg/dL (ref 0.44–1.00)
GFR, Estimated: >60 mL/min (ref >60)
Glucose, Bld: 121 mg/dL — ABNORMAL HIGH (ref 70–99)
Potassium: 3.1 mmol/L — ABNORMAL LOW (ref 3.5–5.1)
Sodium: 141 mmol/L (ref 135–145)
Total Bilirubin: 0.3 mg/dL (ref 0.3–1.2)
Total Protein: 6.6 g/dL (ref 6.5–8.1)

## 2020-08-23 LAB — TSH: TSH: 1.735 u[IU]/mL (ref 0.308–3.960)

## 2020-08-23 MED ORDER — SODIUM CHLORIDE 0.9% FLUSH
10.0000 mL | INTRAVENOUS | Status: DC | PRN
  Administered 2020-08-23: 11:00:00 10 mL
  Filled 2020-08-23: qty 10

## 2020-08-23 MED ORDER — HEPARIN SOD (PORK) LOCK FLUSH 100 UNIT/ML IV SOLN
500.0000 [IU] | Freq: Once | INTRAVENOUS | Status: AC | PRN
  Administered 2020-08-23: 11:00:00 500 [IU]
  Filled 2020-08-23: qty 5

## 2020-08-23 MED ORDER — POTASSIUM CHLORIDE 20 MEQ/15ML (10%) PO SOLN: 20.0000 meq | Freq: Every day | ORAL | 0 refills | Status: DC

## 2020-08-23 MED ORDER — SODIUM CHLORIDE 0.9 % IV SOLN
1200.0000 mg | Freq: Once | INTRAVENOUS | Status: AC
  Administered 2020-08-23: 11:00:00 1200 mg via INTRAVENOUS
  Filled 2020-08-23: qty 20

## 2020-08-23 MED ORDER — SODIUM CHLORIDE 0.9 % IV SOLN
Freq: Once | INTRAVENOUS | Status: AC
  Filled 2020-08-23: qty 250

## 2020-08-23 MED ORDER — SODIUM CHLORIDE 0.9% FLUSH
10.0000 mL | INTRAVENOUS | Status: DC | PRN
  Administered 2020-08-23: 09:00:00 10 mL
  Filled 2020-08-23: qty 10

## 2020-08-23 NOTE — Progress Notes (Signed)
[place] [phone]   [phone]  OFFICE PROGRESS NOTE  [name], MD [address] [place] [address]  DIAGNOSIS: Extensive stage (T2a, N3, [name], large right anterior mediastinal and supraclavicular lymphadenopathy as well as pancreatic and splenic metastasis diagnosed in September 2017.  The patient had disease progression in October 2019.  PRIOR THERAPY:  1) Systemic chemotherapy was carboplatin for AUC of 5 on day 1 and etoposide 100 MG/M2 on days 1, 2 and 3 with Neulasta support. Status post 6 cycles with significant response of her disease. 2) Prophylactic cranial irradiation under the care of Dr. Sondra Come on 12/02/2016. 3) stereotactic radiotherapy to the recurrent right upper lobe pulmonary nodule under the care of Dr. Sondra Come completed November 10, 2017. 4) Retreatment with systemic chemotherapy with carboplatin for AUC of 5 on day 1 and etoposide 100 mg/M2 on days 1, 2 and 3 as well as Tecentriq (Atezolizumab) 1200 mg IV every 3 weeks with Neulasta support.  First dose June 22, 2018 for disease recurrence.  Status post 5 cycles.  Starting from cycle #2 her dose of carboplatin will be reduced to AUC of 4 and etoposide 80 mg/M2 on days 1, 2 and 3 in addition to the regular dose of Tecentriq.  CURRENT THERAPY: Maintenance treatment with single agent Tecentriq 1200 mg IV every 3 weeks.  Status post 32 cycles.  INTERVAL HISTORY: Debra Kidd 59 y.o. female returns to the clinic today for follow-up visit.  The patient is feeling fine today with no concerning complaints except for occasional shortness of breath with exertion.  She has no chest pain, cough or hemoptysis.  She has no nausea, vomiting, diarrhea or constipation.  She has no headache or visual changes.  She denied having any recent weight loss or night sweats.  The patient is here today for evaluation before starting  cycle #3 of her maintenance therapy.   MEDICAL HISTORY: Past Medical History:  Diagnosis Date  . 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 Blood type, Rh positive   . Cancer ([place])   . Chronic pain syndrome   . Depression 08/07/2016  . Encounter for antineoplastic chemotherapy 07/17/2016  . History of external beam radiation therapy 11/19/16-12/02/16   brain 25 Gy in 10 fractions  . Hyperlipidemia   . Hypertension   . Hypertension 08/07/2016  . Osteoporosis   . Small cell lung cancer ([place]) dx'd 05/2016    ALLERGIES:  is allergic to codeine, motrin [ibuprofen], thiazide-type diuretics, and vicodin [hydrocodone-acetaminophen].  MEDICATIONS:  Current Outpatient Medications  Medication Sig Dispense Refill  . clopidogrel (PLAVIX) 75 MG tablet Take 1 tablet (75 mg total) by mouth daily. 30 tablet 0  . diazepam (VALIUM) 5 MG tablet Take 5 mg by mouth 3 (three) times daily as needed for anxiety.   2  . hydrOXYzine (ATARAX/VISTARIL) 10 MG tablet Take 1 tablet (10 mg total) by mouth 3 (three) times daily as needed. 90 tablet 1  . lidocaine-prilocaine (EMLA) cream Apply 1 application topically as needed. 30 g 0  . oxyCODONE-acetaminophen (PERCOCET) 5-325 MG tablet Take 1 tablet by mouth every 6 (six) hours as needed for severe pain. 30 tablet 0  . potassium chloride 20 MEQ/15ML (10%) SOLN Take 15 mLs (20 mEq total) by mouth daily. Please take 15 mL by mouth once daily for 7 days. 105 mL 0  .  prochlorperazine (COMPAZINE) 10 MG tablet Take 1 tablet (10 mg total) by mouth every 6 (six) hours as needed for nausea or vomiting. 30 tablet 2  . traMADol (ULTRAM) 50 MG tablet Take 50 mg by mouth every 6 (six) hours as needed for moderate pain.      No current facility-administered medications for this visit.    SURGICAL HISTORY:  Past Surgical History:  Procedure Laterality Date  . ABDOMINAL HYSTERECTOMY  1981  . APPENDECTOMY     at age 64  . EYE SURGERY     left  . IR GENERIC HISTORICAL   06/03/2016   IR FLUORO GUIDE PORT INSERTION RIGHT 06/03/2016 [facility]  . IR GENERIC HISTORICAL  06/03/2016   IR US GUIDE VASC ACCESS RIGHT 06/03/2016 [facility]  . SKIN CANCER EXCISION     basal cell carcinoma L side of face    REVIEW OF SYSTEMS:  A comprehensive review of systems was negative except for: Constitutional: positive for fatigue Respiratory: positive for dyspnea on exertion   PHYSICAL EXAMINATION: General appearance: alert, cooperative and no distress Head: Normocephalic, without obvious abnormality, atraumatic Neck: no adenopathy, no JVD, supple, symmetrical, trachea midline and thyroid not enlarged, symmetric, no tenderness/mass/nodules Lymph nodes: Cervical, supraclavicular, and axillary nodes normal. Resp: clear to auscultation bilaterally Back: symmetric, no curvature. ROM normal. No CVA tenderness. Cardio: regular rate and rhythm, S1, S2 normal, no murmur, click, rub or gallop GI: soft, non-tender; bowel sounds normal; no masses,  no organomegaly Extremities: extremities normal, atraumatic, no cyanosis or edema  ECOG PERFORMANCE STATUS: 1 - Symptomatic but completely ambulatory  Blood pressure (!) 146/82, pulse 91, temperature 97.8 F (36.6 C), temperature source Tympanic, resp. rate 20, height 5\' 3"  (1.6 m), weight 124 lb 3.2 oz (56.3 kg), SpO2 99 %.  LABORATORY DATA: Lab Results  Component Value Date   WBC 8.2 08/23/2020   HGB 12.4 08/23/2020   HCT 36.1 08/23/2020   MCV 105.2 (H) 08/23/2020   PLT 240 08/23/2020      Chemistry      Component Value Date/Time   NA 140 08/02/2020 0853   NA 141 08/01/2017 0835   K 3.2 (L) 08/02/2020 0853   K 3.5 08/01/2017 0835   CL 106 08/02/2020 0853   CO2 26 08/02/2020 0853   CO2 25 08/01/2017 0835   BUN 5 (L) 08/02/2020 0853   BUN 8.0 08/01/2017 0835   CREATININE 0.80 08/02/2020 0853   CREATININE 0.9 08/01/2017 0835      Component Value Date/Time   CALCIUM 8.8 (L) 08/02/2020 0853   CALCIUM 9.9 08/01/2017  0835   ALKPHOS 109 08/02/2020 0853   ALKPHOS 142 08/01/2017 0835   AST 14 (L) 08/02/2020 0853   AST 15 08/01/2017 0835   ALT 11 08/02/2020 0853   ALT 19 08/01/2017 0835   BILITOT 0.3 08/02/2020 0853   BILITOT 0.35 08/01/2017 0835       RADIOGRAPHIC STUDIES: No results found.  ASSESSMENT AND PLAN:  This is a very pleasant 59 years old white female with extensive stage small cell lung cancer status post systemic chemotherapy with carbo platinum and etoposide for 6 cycles and the patient rotated her treatment well except for chemotherapy-induced anemia and requirement for PRBCs and platelet transfusion. She had significant improvement in her disease with the chemotherapy. She also had prophylactic cranial irradiation. She also underwent stereotactic radiotherapy to progressive right upper lobe pulmonary nodule. The patient has been in observation for close to 2 years. Repeat CT scan  of the chest, abdomen and pelvis performed recently showed evidence for disease progression in the abdomen. The patient was started on systemic chemotherapy again with carboplatin, etoposide and Tecentriq status post 36 cycles.  Starting from cycle #5 the patient is treated with maintenance single agent Tecentriq. She has been tolerating this treatment well with no concerning adverse effect except for mild fatigue. I recommended for her to proceed with cycle #37 today as planned. She will come back for follow-up visit in 3 weeks for evaluation with repeat CT scan of the chest, abdomen pelvis for restaging of her disease. The patient was advised to call immediately if she has any concerning symptoms in the interval. The patient voices understanding of current disease status and treatment options and is in agreement with the current care plan. All questions were answered. The patient knows to call the clinic with any problems, questions or concerns. We can certainly see the patient much sooner if  necessary.  Disclaimer: This note was dictated with voice recognition software. Similar sounding words can inadvertently be transcribed and may not be corrected upon review.

## 2020-08-23 NOTE — Patient Instructions (Signed)
[facility] Discharge Instructions for Patients Receiving Chemotherapy  Today you received the following chemotherapy agents: Tecentriq  To help prevent nausea and vomiting after your treatment, we encourage you to take your nausea medication as directed.   If you develop nausea and vomiting that is not controlled by your nausea medication, call the clinic.   BELOW ARE SYMPTOMS THAT SHOULD BE REPORTED IMMEDIATELY:  *FEVER GREATER THAN 100.5 F  *CHILLS WITH OR WITHOUT FEVER  NAUSEA AND VOMITING THAT IS NOT CONTROLLED WITH YOUR NAUSEA MEDICATION  *UNUSUAL SHORTNESS OF BREATH  *UNUSUAL BRUISING OR BLEEDING  TENDERNESS IN MOUTH AND THROAT WITH OR WITHOUT PRESENCE OF ULCERS  *URINARY PROBLEMS  *BOWEL PROBLEMS  UNUSUAL RASH Items with * indicate a potential emergency and should be followed up as soon as possible.  Feel free to call the clinic should you have any questions or concerns. The clinic phone number is [phone].  Please show the CHEMO ALERT CARD at check-in to the Emergency Department and triage nurse.   

## 2020-08-28 ENCOUNTER — Telehealth: Payer: Self-pay | Admitting: Internal Medicine

## 2020-08-28 NOTE — Telephone Encounter (Signed)
Scheduled appts per 12/23 los. Pt to get updated appt calendar at next visit per appt notes.

## 2020-09-07 ENCOUNTER — Ambulatory Visit (HOSPITAL_COMMUNITY)
Admission: RE | Admit: 2020-09-07 | Discharge: 2020-09-07 | Disposition: A | Discharge status: Home / Self Care | Payer: Medicare Other | Source: Ambulatory Visit | Attending: Physician Assistant | Admitting: Physician Assistant

## 2020-09-07 ENCOUNTER — Other Ambulatory Visit: Payer: Self-pay

## 2020-09-07 DIAGNOSIS — C3491 Malignant neoplasm of unspecified part of right bronchus or lung: Secondary | ICD-10-CM | POA: Insufficient documentation

## 2020-09-07 MED ORDER — IOHEXOL 300 MG/ML  SOLN
100.0000 mL | Freq: Once | INTRAMUSCULAR | Status: AC | PRN
  Administered 2020-09-07: 100 mL via INTRAVENOUS

## 2020-09-07 MED ORDER — HEPARIN SOD (PORK) LOCK FLUSH 100 UNIT/ML IV SOLN
500.0000 [IU] | Freq: Once | INTRAVENOUS | Status: AC
  Administered 2020-09-07: 500 [IU] via INTRAVENOUS

## 2020-09-07 MED ORDER — HEPARIN SOD (PORK) LOCK FLUSH 100 UNIT/ML IV SOLN
INTRAVENOUS | Status: AC
  Filled 2020-09-07: qty 5

## 2020-09-08 NOTE — Progress Notes (Signed)
[facility] OFFICE PROGRESS NOTE  [name], MD [address] [place] [address]  DIAGNOSIS: Extensive stage (T2a, N3, [name], large right anterior mediastinal and supraclavicular lymphadenopathy as well as pancreatic and splenic metastasis diagnosed in September 2017. The patient had disease progression in October 2019.  PRIOR THERAPY: 1) Systemic chemotherapy was carboplatin for AUC of 5 on day 1 and etoposide 100 MG/M2 on days 1, 2 and 3 with Neulasta support. Status post 6 cycles with significant response of her disease. 2) Prophylactic cranial irradiation under the care of Dr. Sondra Come on 12/02/2016. 3) stereotactic radiotherapy to the recurrent right upper lobe pulmonary nodule under the care of Dr. Sondra Come completed November 10, 2017. 4)Retreatment with systemic chemotherapy with carboplatin for AUC of 5 on day 1 and etoposide 100 mg/M2 on days 1, 2 and 3 as well as Tecentriq (Atezolizumab) 1200 mg IV every 3 weeks with Neulasta support. First dose June 22, 2018 for disease recurrence. Status post 5 cycles. Starting from cycle #2 her dose of carboplatin will be reduced to AUC of 4 and etoposide 80 mg/M2 on days 1, 2 and 3 in addition to the regular dose of Tecentriq  CURRENT THERAPY: Maintenance treatment with single agent Tecentriq 1200 mg IV every 3 weeks. Status post37cycles  INTERVAL HISTORY: Debra Kidd 60 y.o. female returns to the clinic for a follow up visit. The patient is feeling fair today without any concerning complaints except she fell this morning due to weakness. She reports bilateral lower extremity weakness; however, L>R. She states this has been happening gradually since she had her COVID-19 booster in October 2021 which is what she attributes her symptoms to. She has a history of a stroke in May 2020. She was seen by neurology and vascular. She was evaluated in the past for carotid  stenosis.From talking to the patient, it sounds like intervention was recommended but she declined. We had an MRI in November 2021 due to her symptoms of headache and weakness to rule out metastatic disease to the brain. 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 She was instructed to follow up with neurology. She states she tried calling their office but her call was not returned. I asked who her neurologist was and she could not recall the name or the practice name. Unclear from chart review who she saw in the past. She denies any numbness or tingling or sensation changes in her extremities. Denies speech changes or facial drooping. She is not interested in PT.   The patient continues to tolerate treatment with immunotherapy with Tecentriqwell without any adverse effects except occasional itching for which she uses atarax.Her itching has been better recentlythough.Denies any fever, chills,or night sweats.She lost about 2lb since her last appointment due to not eating as much. She uses carnation instant breakfast but does not drink boost or ensure.Denies any chest pain or hemoptysisbut endorses her baseline coughand dyspnea on exertion which is unchanged.Denies any vomiting or nausea. She denies constipation and diarrhea. She recently had a restaging CT scan of the chest, abdomen, and pelvis. The patient is here for evaluation and to review her scan results prior to starting cycle #38.    MEDICAL HISTORY: Past Medical History:  Diagnosis Date  . 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 Blood  type, Rh positive   . Cancer ([facility])   . Chronic pain syndrome   . Depression 08/07/2016  . Encounter for antineoplastic chemotherapy 07/17/2016  . History of external beam radiation therapy 11/19/16-12/02/16   brain 25 Gy in 10 fractions  . Hyperlipidemia   . Hypertension   . Hypertension 08/07/2016  .  Osteoporosis   . Small cell lung cancer ([facility]) dx'd 05/2016    ALLERGIES:  is allergic to codeine, motrin [ibuprofen], thiazide-type diuretics, and vicodin [hydrocodone-acetaminophen].  MEDICATIONS:  Current Outpatient Medications  Medication Sig Dispense Refill  . clopidogrel (PLAVIX) 75 MG tablet Take 1 tablet (75 mg total) by mouth daily. 30 tablet 0  . diazepam (VALIUM) 5 MG tablet Take 5 mg by mouth 3 (three) times daily as needed for anxiety.   2  . hydrOXYzine (ATARAX/VISTARIL) 10 MG tablet Take 1 tablet (10 mg total) by mouth 3 (three) times daily as needed. 90 tablet 1  . lidocaine-prilocaine (EMLA) cream Apply 1 application topically as needed. 30 g 0  . oxyCODONE-acetaminophen (PERCOCET) 5-325 MG tablet Take 1 tablet by mouth every 6 (six) hours as needed for severe pain. 30 tablet 0  . prochlorperazine (COMPAZINE) 10 MG tablet Take 1 tablet (10 mg total) by mouth every 6 (six) hours as needed for nausea or vomiting. 30 tablet 2  . traMADol (ULTRAM) 50 MG tablet Take 50 mg by mouth every 6 (six) hours as needed for moderate pain.     . potassium chloride 20 MEQ/15ML (10%) SOLN Take 15 mLs (20 mEq total) by mouth daily. Please take 15 mL by mouth once daily for 7 days. 105 mL 0   No current facility-administered medications for this visit.   Facility-Administered Medications Ordered in Other Visits  Medication Dose Route Frequency Provider Last Rate Last Admin  . atezolizumab (TECENTRIQ) 1,200 mg in sodium chloride 0.9 % 250 mL chemo infusion  1,200 mg Intravenous Once [name], MD 540 mL/hr at 09/13/20 1315 1,200 mg at 09/13/20 1315  . heparin lock flush 100 unit/mL  500 Units Intracatheter Once PRN [name], MD      . sodium chloride flush (NS) 0.9 % injection 10 mL  10 mL Intracatheter PRN [name], MD        SURGICAL HISTORY:  Past Surgical History:  Procedure Laterality Date  . ABDOMINAL HYSTERECTOMY  1981  . APPENDECTOMY     at age 7  . EYE  SURGERY     left  . IR GENERIC HISTORICAL  06/03/2016   IR FLUORO GUIDE PORT INSERTION RIGHT 06/03/2016 [facility]  . IR GENERIC HISTORICAL  06/03/2016   IR US GUIDE VASC ACCESS RIGHT 06/03/2016 [facility]  . SKIN CANCER EXCISION     basal cell carcinoma L side of face    REVIEW OF SYSTEMS:   Review of Systems Constitutional: Negative for appetite change, chills, fatigue, fever and unexpected weight change.  HENT: Negative for mouth sores, nosebleeds, sore throat and trouble swallowing.  Eyes: Negative for eye problems and icterus.  Respiratory:Positive for baseline coughand shortness of breath with exertion.Negative for hemoptysis and wheezing.  Cardiovascular: Negative for chest pain and leg swelling.  Gastrointestinal:Negative for abdominal pain, constipation, nausea and vomiting.  Genitourinary: Negative for bladder incontinence, difficulty urinating, dysuria, frequency and hematuria.  Musculoskeletal:Negative for back pain, gait problem, neck pain and neck stiffness.  Skin:Positive for itching(improved from prior).Negative for rash.  Neurological:Lower extremity weakness, gradually worsening L>R due to prior stoke.Negative for dizziness, headaches, light-headedness and  seizures.  Hematological: Negative for adenopathy. Does not bruise/bleed easily.  Psychiatric/Behavioral: Negative for confusion, depression and sleep disturbance. The patient is not nervous/anxious.     PHYSICAL EXAMINATION:  Blood pressure 108/75, pulse 85, temperature 98 F (36.7 C), temperature source Tympanic, resp. rate 16, height 5\' 3"  (1.6 m), weight 122 lb (Debra.3 kg), SpO2 99 %.  ECOG PERFORMANCE STATUS: 1 - Symptomatic but completely ambulatory  Physical Exam  Constitutional: Oriented to person, place, and time andchronically ill appearing femaleand in no distress.  HENT:  Head: Normocephalic and atraumatic.  Mouth/Throat: Oropharynx is clear and moist. No oropharyngeal exudate.   Eyes: Conjunctivae are normal. Right eye exhibits no discharge. Left eye exhibits no discharge. No scleral icterus.  Neck: Normal range of motion. Neck supple.  Cardiovascular: Normal rate, regular rhythm, normal heart sounds and intact distal pulses.  Pulmonary/Chest: Effort normal and breath sounds normal. No respiratory distress. No wheezes. No rales.  Abdominal: Soft. Bowel sounds are normal. Exhibits no distension and no mass. There is no tenderness.  Musculoskeletal: Normal range of motion. Exhibits no edema.  Lymphadenopathy:  No cervical adenopathy.  Neurological: Alert and oriented to person, place, and time. Exhibits normal muscle tone. Positive for limping due to history of stroke. Skin: Skin is warm and dry. No rash noted. Not diaphoretic. No erythema. No pallor.  Psychiatric: Mood, memory and judgment normal.  Vitals reviewed.  LABORATORY DATA: Lab Results  Component Value Date   WBC 8.2 09/13/2020   HGB 12.3 09/13/2020   HCT 34.1 (L) 09/13/2020   MCV 102.1 (H) 09/13/2020   PLT 255 09/13/2020      Chemistry      Component Value Date/Time   NA 139 09/13/2020 1126   NA 141 08/01/2017 0835   K 3.2 (L) 09/13/2020 1126   K 3.5 08/01/2017 0835   CL 105 09/13/2020 1126   CO2 27 09/13/2020 1126   CO2 25 08/01/2017 0835   BUN 5 (L) 09/13/2020 1126   BUN 8.0 08/01/2017 0835   CREATININE 0.77 09/13/2020 1126   CREATININE 0.9 08/01/2017 0835      Component Value Date/Time   CALCIUM 8.7 (L) 09/13/2020 1126   CALCIUM 9.9 08/01/2017 0835   ALKPHOS 114 09/13/2020 1126   ALKPHOS 142 08/01/2017 0835   AST 11 (L) 09/13/2020 1126   AST 15 08/01/2017 0835   ALT 11 09/13/2020 1126   ALT 19 08/01/2017 0835   BILITOT 0.4 09/13/2020 1126   BILITOT 0.35 08/01/2017 0835       RADIOGRAPHIC STUDIES:  CT Chest W Contrast  Result Date: 09/07/2020 CLINICAL DATA:  Metastatic small-cell lung cancer restaging, ongoing chemotherapy EXAM: CT CHEST, ABDOMEN, AND PELVIS WITH  CONTRAST TECHNIQUE: Multidetector CT imaging of the chest, abdomen and pelvis was performed following the standard protocol during bolus administration of intravenous contrast. CONTRAST:  189mL OMNIPAQUE IOHEXOL 300 MG/ML SOLN, additional oral enteric contrast COMPARISON:  05/29/2020 FINDINGS: CT CHEST FINDINGS Cardiovascular: Right chest port catheter. Aortic atherosclerosis. Normal heart size. Left coronary artery calcifications. No pericardial effusion. Mediastinum/Nodes: No enlarged mediastinal, hilar, or axillary lymph nodes. Thyroid gland, trachea, and esophagus demonstrate no significant findings. Lungs/Pleura: Unchanged appearance of the right pulmonary apex with post treatment/radiation consolidation and fibrosis (series 4, image 28). No pleural effusion or pneumothorax. Musculoskeletal: No chest wall mass or suspicious bone lesions identified. Unchanged subtle wedge deformities of T6 through T8. CT ABDOMEN PELVIS FINDINGS Hepatobiliary: No solid liver abnormality is seen. Unchanged focal fatty deposition adjacent to the falciform ligament (  series 2, image 56). No gallstones, gallbladder wall thickening, or biliary dilatation. Pancreas: Unremarkable. No pancreatic ductal dilatation or surrounding inflammatory changes. Spleen: Normal in size without significant abnormality. Adrenals/Urinary Tract: Adrenal glands are unremarkable. Kidneys are normal, without renal calculi, solid lesion, or hydronephrosis. Bladder is unremarkable. Stomach/Bowel: Stomach is within normal limits. Appendix is not clearly visualized and may be surgically absent. No evidence of bowel wall thickening, distention, or inflammatory changes. Vascular/Lymphatic: Aortic atherosclerosis. No enlarged abdominal or pelvic lymph nodes. Reproductive: Status post hysterectomy. Other: No abdominal wall hernia or abnormality. No abdominopelvic ascites. Musculoskeletal: No acute or significant osseous findings. Unchanged superior endplate deformity  of L2. IMPRESSION: 1. Unchanged appearance of the right pulmonary apex with post treatment/radiation consolidation and fibrosis. 2. No evidence of recurrent or metastatic disease within the chest, abdomen, or pelvis. 3. Coronary artery disease. Aortic Atherosclerosis (ICD10-I70.0). Electronically Signed   By: Eddie Candle M.D.   On: 09/07/2020 15:03   CT Abdomen Pelvis W Contrast  Result Date: 09/07/2020 CLINICAL DATA:  Metastatic small-cell lung cancer restaging, ongoing chemotherapy EXAM: CT CHEST, ABDOMEN, AND PELVIS WITH CONTRAST TECHNIQUE: Multidetector CT imaging of the chest, abdomen and pelvis was performed following the standard protocol during bolus administration of intravenous contrast. CONTRAST:  187mL OMNIPAQUE IOHEXOL 300 MG/ML SOLN, additional oral enteric contrast COMPARISON:  05/29/2020 FINDINGS: CT CHEST FINDINGS Cardiovascular: Right chest port catheter. Aortic atherosclerosis. Normal heart size. Left coronary artery calcifications. No pericardial effusion. Mediastinum/Nodes: No enlarged mediastinal, hilar, or axillary lymph nodes. Thyroid gland, trachea, and esophagus demonstrate no significant findings. Lungs/Pleura: Unchanged appearance of the right pulmonary apex with post treatment/radiation consolidation and fibrosis (series 4, image 28). No pleural effusion or pneumothorax. Musculoskeletal: No chest wall mass or suspicious bone lesions identified. Unchanged subtle wedge deformities of T6 through T8. CT ABDOMEN PELVIS FINDINGS Hepatobiliary: No solid liver abnormality is seen. Unchanged focal fatty deposition adjacent to the falciform ligament (series 2, image 56). No gallstones, gallbladder wall thickening, or biliary dilatation. Pancreas: Unremarkable. No pancreatic ductal dilatation or surrounding inflammatory changes. Spleen: Normal in size without significant abnormality. Adrenals/Urinary Tract: Adrenal glands are unremarkable. Kidneys are normal, without renal calculi, solid  lesion, or hydronephrosis. Bladder is unremarkable. Stomach/Bowel: Stomach is within normal limits. Appendix is not clearly visualized and may be surgically absent. No evidence of bowel wall thickening, distention, or inflammatory changes. Vascular/Lymphatic: Aortic atherosclerosis. No enlarged abdominal or pelvic lymph nodes. Reproductive: Status post hysterectomy. Other: No abdominal wall hernia or abnormality. No abdominopelvic ascites. Musculoskeletal: No acute or significant osseous findings. Unchanged superior endplate deformity of L2. IMPRESSION: 1. Unchanged appearance of the right pulmonary apex with post treatment/radiation consolidation and fibrosis. 2. No evidence of recurrent or metastatic disease within the chest, abdomen, or pelvis. 3. Coronary artery disease. Aortic Atherosclerosis (ICD10-I70.0). Electronically Signed   By: Eddie Candle M.D.   On: 09/07/2020 15:03     ASSESSMENT/PLAN:  This is a very pleasant 60 year Kidd Caucasian female with extensive stage small cell lung cancer. She presented with a right upper lobe lung mass, large right anterior mediastinal and supraclavicular lymphadenopathy as well as a pancreatic andsplenic metastasis. She was diagnosed in September 2017.  The patient underwent systemic chemotherapy with carboplatin and etoposide. She is status post 6 cycles. She tolerated treatment well except for chemotherapy-induced anemia which required pRBCs and platelet transfusions. She had a significant improvement of her disease with chemotherapy.The patientthenunderwent prophylactic cranial irradiation. She then underwent stereotactic radiotherapy to the right upper lobe and pulmonary nodule.  She had  been on observation for 2 years before showing evidence of disease progression.   She recently hadbeen started on systemic chemotherapy with carboplatin, etoposide, and Tecentriq. Starting from cycle #5, she has been on maintenance single agent Tecentriq. She  has been tolerating treatment well.She is status post37cycles of single agent Tecentriq.  The patient recently had a restaging CT scan of the chest, abdomen, and pelvis.  Dr. Earlie Server personally and independently reviewed the scan discussed the results with patient today.  The scan did not show any evidence for disease progression.  Recommend that she continue on the same treatment at the same dose.  She will proceed with cycle #38 today scheduled.  We will see her back for follow-up visit in 3 weeks for evaluation before starting cycle #39.  I have refilled her potassium for her hypokalemia.   She will continue to take atarax if needed for itching.   Regarding the patient's gradually worsening weakness, I offered PT. She declined. She was strongly encouraged to follow up with neurology due to the worsening weakness and worsening vascular disease seen on recent brain MRI. She cannot recall who she saw in the past. I will place a new referral to neurology.   The patient was advised to call immediately if she has any concerning symptoms in the interval. The patient voices understanding of current disease status and treatment options and is in agreement with the current care plan. All questions were answered. The patient knows to call the clinic with any problems, questions or concerns. We can certainly see the patient much sooner if necessary       Orders Placed This Encounter  Procedures  . CMP ([facility] only)    Standing Status:   Standing    Number of Occurrences:   20    Standing Expiration Date:   09/13/2021  . CBC with Differential (Cancer Center Only)    Standing Status:   Standing    Number of Occurrences:   20    Standing Expiration Date:   09/13/2021  . Ambulatory referral to Neurology    Referral Priority:   Routine    Referral Type:   Consultation    Referral Reason:   Specialty Services Required    Requested Specialty:   Neurology    Number of Visits Requested:    [place], PA-C 09/13/20  ADDENDUM: Hematology/Oncology Attending: I had a face-to-face encounter with the patient today.  I recommended her care plan.  This is a very pleasant Debra Kidd white female with recurrent small cell lung cancer that was initially diagnosed in September 2017 as extensive stage disease.  The patient was treated with systemic chemotherapy with carboplatin, etoposide and Tecentriq for 4 cycles followed by maintenance Tecentriq status post 33 cycles.  The patient has been tolerating her treatment well with no concerning complaints except for occasional dizzy spells and falls at home.  She had MRI of the brain few months ago that was unremarkable for any intracranial metastatic disease but the patient has evidence for carotid stenosis and predisposition for stroke.  She was seen by neurology in the past but did not have any follow-up appointment with them. The patient had repeat CT scan of the chest, abdomen pelvis performed recently.  I personally and independently reviewed the scans and discussed the results with the patient today. Her scan showed no concerning findings for disease progression. I recommended for the patient to continue her current treatment  with Tecentriq and she will proceed with cycle #34 of the maintenance therapy today. For the dizzy spells and fall, we will refer the patient to neurology for further evaluation and recommendation. The patient was advised to call immediately if she has any other concerning symptoms in the interval.  The total time spent in the appointment was 34 minutes.  Disclaimer: This note was dictated with voice recognition software. Similar sounding words can inadvertently be transcribed and may be missed upon review. Eilleen Kempf, MD 09/13/20

## 2020-09-13 ENCOUNTER — Inpatient Hospital Stay: Payer: Medicare Other | Attending: Internal Medicine

## 2020-09-13 ENCOUNTER — Inpatient Hospital Stay (HOSPITAL_BASED_OUTPATIENT_CLINIC_OR_DEPARTMENT_OTHER): Payer: Medicare Other | Admitting: Physician Assistant

## 2020-09-13 ENCOUNTER — Other Ambulatory Visit: Payer: Self-pay

## 2020-09-13 ENCOUNTER — Inpatient Hospital Stay: Payer: Medicare Other

## 2020-09-13 VITALS — BP 108/75 | HR 85 | Temp 98.0°F | Resp 16 | Ht 63.0 in | Wt 122.0 lb

## 2020-09-13 DIAGNOSIS — I10 Essential (primary) hypertension: Secondary | ICD-10-CM | POA: Diagnosis not present

## 2020-09-13 DIAGNOSIS — G894 Chronic pain syndrome: Secondary | ICD-10-CM | POA: Diagnosis not present

## 2020-09-13 DIAGNOSIS — F329 Major depressive disorder, single episode, unspecified: Secondary | ICD-10-CM | POA: Diagnosis not present

## 2020-09-13 DIAGNOSIS — M810 Age-related osteoporosis without current pathological fracture: Secondary | ICD-10-CM | POA: Diagnosis not present

## 2020-09-13 DIAGNOSIS — Z923 Personal history of irradiation: Secondary | ICD-10-CM | POA: Insufficient documentation

## 2020-09-13 DIAGNOSIS — C3491 Malignant neoplasm of unspecified part of right bronchus or lung: Secondary | ICD-10-CM

## 2020-09-13 DIAGNOSIS — R29898 Other symptoms and signs involving the musculoskeletal system: Secondary | ICD-10-CM | POA: Diagnosis not present

## 2020-09-13 DIAGNOSIS — E876 Hypokalemia: Secondary | ICD-10-CM | POA: Insufficient documentation

## 2020-09-13 DIAGNOSIS — E785 Hyperlipidemia, unspecified: Secondary | ICD-10-CM | POA: Insufficient documentation

## 2020-09-13 DIAGNOSIS — I700 Atherosclerosis of aorta: Secondary | ICD-10-CM | POA: Insufficient documentation

## 2020-09-13 DIAGNOSIS — D6481 Anemia due to antineoplastic chemotherapy: Secondary | ICD-10-CM | POA: Insufficient documentation

## 2020-09-13 DIAGNOSIS — Z85828 Personal history of other malignant neoplasm of skin: Secondary | ICD-10-CM | POA: Insufficient documentation

## 2020-09-13 DIAGNOSIS — R5383 Other fatigue: Secondary | ICD-10-CM

## 2020-09-13 DIAGNOSIS — I2510 Atherosclerotic heart disease of native coronary artery without angina pectoris: Secondary | ICD-10-CM | POA: Diagnosis not present

## 2020-09-13 DIAGNOSIS — Z8673 Personal history of transient ischemic attack (TIA), and cerebral infarction without residual deficits: Secondary | ICD-10-CM | POA: Diagnosis not present

## 2020-09-13 DIAGNOSIS — C7889 Secondary malignant neoplasm of other digestive organs: Secondary | ICD-10-CM | POA: Insufficient documentation

## 2020-09-13 DIAGNOSIS — Z5112 Encounter for antineoplastic immunotherapy: Secondary | ICD-10-CM | POA: Insufficient documentation

## 2020-09-13 DIAGNOSIS — R42 Dizziness and giddiness: Secondary | ICD-10-CM | POA: Insufficient documentation

## 2020-09-13 DIAGNOSIS — Z9221 Personal history of antineoplastic chemotherapy: Secondary | ICD-10-CM | POA: Diagnosis not present

## 2020-09-13 DIAGNOSIS — C3411 Malignant neoplasm of upper lobe, right bronchus or lung: Secondary | ICD-10-CM | POA: Insufficient documentation

## 2020-09-13 LAB — CBC WITH DIFFERENTIAL (CANCER CENTER ONLY)
Abs Immature Granulocytes: 0.14 10*3/uL — ABNORMAL HIGH (ref 0.00–0.07)
Basophils Absolute: 0 10*3/uL (ref 0.0–0.1)
Basophils Relative: 1 %
Eosinophils Absolute: 0.2 10*3/uL (ref 0.0–0.5)
Eosinophils Relative: 3 %
HCT: 34.1 % — ABNORMAL LOW (ref 36.0–46.0)
Hemoglobin: 12.3 g/dL (ref 12.0–15.0)
Immature Granulocytes: 2 %
Lymphocytes Relative: 26 %
Lymphs Abs: 2.1 10*3/uL (ref 0.7–4.0)
MCH: 36.8 pg — ABNORMAL HIGH (ref 26.0–34.0)
MCHC: 36.1 g/dL — ABNORMAL HIGH (ref 30.0–36.0)
MCV: 102.1 fL — ABNORMAL HIGH (ref 80.0–100.0)
Monocytes Absolute: 0.6 10*3/uL (ref 0.1–1.0)
Monocytes Relative: 8 %
Neutro Abs: 5.1 10*3/uL (ref 1.7–7.7)
Neutrophils Relative %: 60 %
Platelet Count: 255 10*3/uL (ref 150–400)
RBC: 3.34 MIL/uL — ABNORMAL LOW (ref 3.87–5.11)
RDW: 11.9 % (ref 11.5–15.5)
WBC Count: 8.2 10*3/uL (ref 4.0–10.5)
nRBC: 0 % (ref 0.0–0.2)

## 2020-09-13 LAB — CMP (CANCER CENTER ONLY)
ALT: 11 U/L (ref 0–44)
AST: 11 U/L — ABNORMAL LOW (ref 15–41)
Albumin: 3.4 g/dL — ABNORMAL LOW (ref 3.5–5.0)
Alkaline Phosphatase: 114 U/L (ref 38–126)
Anion gap: 7 (ref 5–15)
BUN: 5 mg/dL — ABNORMAL LOW (ref 8–23)
CO2: 27 mmol/L (ref 22–32)
Calcium: 8.7 mg/dL — ABNORMAL LOW (ref 8.9–10.3)
Chloride: 105 mmol/L (ref 98–111)
Creatinine: 0.77 mg/dL (ref 0.44–1.00)
GFR, Estimated: >60 mL/min (ref >60)
Glucose, Bld: 102 mg/dL — ABNORMAL HIGH (ref 70–99)
Potassium: 3.2 mmol/L — ABNORMAL LOW (ref 3.5–5.1)
Sodium: 139 mmol/L (ref 135–145)
Total Bilirubin: 0.4 mg/dL (ref 0.3–1.2)
Total Protein: 6.5 g/dL (ref 6.5–8.1)

## 2020-09-13 LAB — TSH: TSH: 2.093 u[IU]/mL (ref 0.308–3.960)

## 2020-09-13 MED ORDER — SODIUM CHLORIDE 0.9% FLUSH
10.0000 mL | INTRAVENOUS | Status: DC | PRN
  Administered 2020-09-13: 10 mL
  Filled 2020-09-13: qty 10

## 2020-09-13 MED ORDER — HEPARIN SOD (PORK) LOCK FLUSH 100 UNIT/ML IV SOLN
500.0000 [IU] | Freq: Once | INTRAVENOUS | Status: AC | PRN
  Administered 2020-09-13: 500 [IU]
  Filled 2020-09-13: qty 5

## 2020-09-13 MED ORDER — SODIUM CHLORIDE 0.9 % IV SOLN
Freq: Once | INTRAVENOUS | Status: AC
  Filled 2020-09-13: qty 250

## 2020-09-13 MED ORDER — SODIUM CHLORIDE 0.9 % IV SOLN
1200.0000 mg | Freq: Once | INTRAVENOUS | Status: AC
  Administered 2020-09-13: 1200 mg via INTRAVENOUS
  Filled 2020-09-13: qty 20

## 2020-09-13 MED ORDER — POTASSIUM CHLORIDE 20 MEQ/15ML (10%) PO SOLN: 20.0000 meq | Freq: Every day | ORAL | 0 refills | Status: DC

## 2020-09-13 NOTE — Patient Instructions (Signed)
[facility] Discharge Instructions for Patients Receiving Chemotherapy  Today you received the following chemotherapy agents: Tecentriq  To help prevent nausea and vomiting after your treatment, we encourage you to take your nausea medication as directed.   If you develop nausea and vomiting that is not controlled by your nausea medication, call the clinic.   BELOW ARE SYMPTOMS THAT SHOULD BE REPORTED IMMEDIATELY:  *FEVER GREATER THAN 100.5 F  *CHILLS WITH OR WITHOUT FEVER  NAUSEA AND VOMITING THAT IS NOT CONTROLLED WITH YOUR NAUSEA MEDICATION  *UNUSUAL SHORTNESS OF BREATH  *UNUSUAL BRUISING OR BLEEDING  TENDERNESS IN MOUTH AND THROAT WITH OR WITHOUT PRESENCE OF ULCERS  *URINARY PROBLEMS  *BOWEL PROBLEMS  UNUSUAL RASH Items with * indicate a potential emergency and should be followed up as soon as possible.  Feel free to call the clinic should you have any questions or concerns. The clinic phone number is [phone].  Please show the CHEMO ALERT CARD at check-in to the Emergency Department and triage nurse.   

## 2020-09-14 ENCOUNTER — Telehealth: Payer: Self-pay | Admitting: Physician Assistant

## 2020-09-14 NOTE — Telephone Encounter (Signed)
Scheduled appts per 1/12 los. Pt to get updated appt calendar at next visit per appt notes.

## 2020-10-03 ENCOUNTER — Inpatient Hospital Stay: Payer: Medicare Other | Attending: Internal Medicine

## 2020-10-03 ENCOUNTER — Other Ambulatory Visit: Payer: Self-pay | Admitting: Physician Assistant

## 2020-10-03 ENCOUNTER — Other Ambulatory Visit: Payer: Self-pay

## 2020-10-03 ENCOUNTER — Inpatient Hospital Stay (HOSPITAL_BASED_OUTPATIENT_CLINIC_OR_DEPARTMENT_OTHER): Payer: Medicare Other | Admitting: Internal Medicine

## 2020-10-03 ENCOUNTER — Inpatient Hospital Stay: Payer: Medicare Other

## 2020-10-03 VITALS — BP 141/84 | HR 94 | Temp 98.1°F | Resp 20 | Ht 63.0 in | Wt 121.8 lb

## 2020-10-03 DIAGNOSIS — E785 Hyperlipidemia, unspecified: Secondary | ICD-10-CM | POA: Insufficient documentation

## 2020-10-03 DIAGNOSIS — C3491 Malignant neoplasm of unspecified part of right bronchus or lung: Secondary | ICD-10-CM

## 2020-10-03 DIAGNOSIS — Z85828 Personal history of other malignant neoplasm of skin: Secondary | ICD-10-CM | POA: Insufficient documentation

## 2020-10-03 DIAGNOSIS — I700 Atherosclerosis of aorta: Secondary | ICD-10-CM | POA: Insufficient documentation

## 2020-10-03 DIAGNOSIS — I2510 Atherosclerotic heart disease of native coronary artery without angina pectoris: Secondary | ICD-10-CM | POA: Insufficient documentation

## 2020-10-03 DIAGNOSIS — Z79899 Other long term (current) drug therapy: Secondary | ICD-10-CM | POA: Diagnosis not present

## 2020-10-03 DIAGNOSIS — Z5112 Encounter for antineoplastic immunotherapy: Secondary | ICD-10-CM | POA: Insufficient documentation

## 2020-10-03 DIAGNOSIS — G894 Chronic pain syndrome: Secondary | ICD-10-CM | POA: Insufficient documentation

## 2020-10-03 DIAGNOSIS — I10 Essential (primary) hypertension: Secondary | ICD-10-CM | POA: Insufficient documentation

## 2020-10-03 DIAGNOSIS — C7889 Secondary malignant neoplasm of other digestive organs: Secondary | ICD-10-CM | POA: Insufficient documentation

## 2020-10-03 DIAGNOSIS — C3411 Malignant neoplasm of upper lobe, right bronchus or lung: Secondary | ICD-10-CM | POA: Insufficient documentation

## 2020-10-03 DIAGNOSIS — M810 Age-related osteoporosis without current pathological fracture: Secondary | ICD-10-CM | POA: Insufficient documentation

## 2020-10-03 DIAGNOSIS — E876 Hypokalemia: Secondary | ICD-10-CM

## 2020-10-03 DIAGNOSIS — Z9221 Personal history of antineoplastic chemotherapy: Secondary | ICD-10-CM | POA: Insufficient documentation

## 2020-10-03 DIAGNOSIS — F329 Major depressive disorder, single episode, unspecified: Secondary | ICD-10-CM | POA: Diagnosis not present

## 2020-10-03 DIAGNOSIS — R5383 Other fatigue: Secondary | ICD-10-CM

## 2020-10-03 DIAGNOSIS — Z923 Personal history of irradiation: Secondary | ICD-10-CM | POA: Diagnosis not present

## 2020-10-03 LAB — CBC WITH DIFFERENTIAL (CANCER CENTER ONLY)
Abs Immature Granulocytes: 0.02 10*3/uL (ref 0.00–0.07)
Basophils Absolute: 0 10*3/uL (ref 0.0–0.1)
Basophils Relative: 1 %
Eosinophils Absolute: 0.3 10*3/uL (ref 0.0–0.5)
Eosinophils Relative: 3 %
HCT: 36.9 % (ref 36.0–46.0)
Hemoglobin: 12.4 g/dL (ref 12.0–15.0)
Immature Granulocytes: 0 %
Lymphocytes Relative: 27 %
Lymphs Abs: 2.1 10*3/uL (ref 0.7–4.0)
MCH: 35.6 pg — ABNORMAL HIGH (ref 26.0–34.0)
MCHC: 33.6 g/dL (ref 30.0–36.0)
MCV: 106 fL — ABNORMAL HIGH (ref 80.0–100.0)
Monocytes Absolute: 0.4 10*3/uL (ref 0.1–1.0)
Monocytes Relative: 5 %
Neutro Abs: 5 10*3/uL (ref 1.7–7.7)
Neutrophils Relative %: 64 %
Platelet Count: 268 10*3/uL (ref 150–400)
RBC: 3.48 MIL/uL — ABNORMAL LOW (ref 3.87–5.11)
RDW: 12.5 % (ref 11.5–15.5)
WBC Count: 7.8 10*3/uL (ref 4.0–10.5)
nRBC: 0 % (ref 0.0–0.2)

## 2020-10-03 LAB — CMP (CANCER CENTER ONLY)
ALT: 16 U/L (ref 0–44)
AST: 17 U/L (ref 15–41)
Albumin: 3.5 g/dL (ref 3.5–5.0)
Alkaline Phosphatase: 111 U/L (ref 38–126)
Anion gap: 10 (ref 5–15)
BUN: 5 mg/dL — ABNORMAL LOW (ref 8–23)
CO2: 24 mmol/L (ref 22–32)
Calcium: 8.5 mg/dL — ABNORMAL LOW (ref 8.9–10.3)
Chloride: 105 mmol/L (ref 98–111)
Creatinine: 0.79 mg/dL (ref 0.44–1.00)
GFR, Estimated: >60 mL/min (ref >60)
Glucose, Bld: 120 mg/dL — ABNORMAL HIGH (ref 70–99)
Potassium: 3 mmol/L — ABNORMAL LOW (ref 3.5–5.1)
Sodium: 139 mmol/L (ref 135–145)
Total Bilirubin: 0.5 mg/dL (ref 0.3–1.2)
Total Protein: 6.5 g/dL (ref 6.5–8.1)

## 2020-10-03 LAB — TSH: TSH: 2.602 u[IU]/mL (ref 0.308–3.960)

## 2020-10-03 MED ORDER — HEPARIN SOD (PORK) LOCK FLUSH 100 UNIT/ML IV SOLN
500.0000 [IU] | Freq: Once | INTRAVENOUS | Status: AC | PRN
  Administered 2020-10-03: 500 [IU]
  Filled 2020-10-03: qty 5

## 2020-10-03 MED ORDER — SODIUM CHLORIDE 0.9% FLUSH
10.0000 mL | INTRAVENOUS | Status: DC | PRN
  Administered 2020-10-03: 10 mL
  Filled 2020-10-03: qty 10

## 2020-10-03 MED ORDER — SODIUM CHLORIDE 0.9% FLUSH
10.0000 mL | INTRAVENOUS | Status: DC | PRN
Start: 2020-10-03 — End: 2020-10-03
  Administered 2020-10-03: 10 mL
  Filled 2020-10-03: qty 10

## 2020-10-03 MED ORDER — SODIUM CHLORIDE 0.9 % IV SOLN
Freq: Once | INTRAVENOUS | Status: AC
  Filled 2020-10-03: qty 250

## 2020-10-03 MED ORDER — SODIUM CHLORIDE 0.9 % IV SOLN
1200.0000 mg | Freq: Once | INTRAVENOUS | Status: AC
  Administered 2020-10-03: 1200 mg via INTRAVENOUS
  Filled 2020-10-03: qty 20

## 2020-10-03 MED ORDER — POTASSIUM CHLORIDE 20 MEQ/15ML (10%) PO SOLN: 20.0000 meq | Freq: Two times a day (BID) | ORAL | 0 refills | Status: DC

## 2020-10-03 NOTE — Patient Instructions (Addendum)
[facility] Discharge Instructions for Patients Receiving Chemotherapy  Today you received the following  Immunotherapy agent: Atezolizumab Gildardo Pounds)  To help prevent nausea and vomiting after your treatment, we encourage you to take your nausea medication as directed by your MD.  If you develop nausea and vomiting that is not controlled by your nausea medication, call the clinic.   BELOW ARE SYMPTOMS THAT SHOULD BE REPORTED IMMEDIATELY:  *FEVER GREATER THAN 100.5 F  *CHILLS WITH OR WITHOUT FEVER  NAUSEA AND VOMITING THAT IS NOT CONTROLLED WITH YOUR NAUSEA MEDICATION  *UNUSUAL SHORTNESS OF BREATH  *UNUSUAL BRUISING OR BLEEDING  TENDERNESS IN MOUTH AND THROAT WITH OR WITHOUT PRESENCE OF ULCERS  *URINARY PROBLEMS  *BOWEL PROBLEMS  UNUSUAL RASH Items with * indicate a potential emergency and should be followed up as soon as possible.  Feel free to call the clinic should you have any questions or concerns. The clinic phone number is [phone].  Please show the [place] [facility] at check-in to the Emergency Department and triage nurse.

## 2020-10-03 NOTE — Progress Notes (Signed)
[place] [phone]   [phone]) [phone]  OFFICE PROGRESS NOTE  [name], MD [address] [place] [address]  DIAGNOSIS: Extensive stage (T2a, N3, [name], large right anterior mediastinal and supraclavicular lymphadenopathy as well as pancreatic and splenic metastasis diagnosed in September 2017.  The patient had disease progression in October 2019.  PRIOR THERAPY:  1) Systemic chemotherapy was carboplatin for AUC of 5 on day 1 and etoposide 100 MG/M2 on days 1, 2 and 3 with Neulasta support. Status post 6 cycles with significant response of her disease. 2) Prophylactic cranial irradiation under the care of Dr. Sondra Come on 12/02/2016. 3) stereotactic radiotherapy to the recurrent right upper lobe pulmonary nodule under the care of Dr. Sondra Come completed November 10, 2017. 4) Retreatment with systemic chemotherapy with carboplatin for AUC of 5 on day 1 and etoposide 100 mg/M2 on days 1, 2 and 3 as well as Tecentriq (Atezolizumab) 1200 mg IV every 3 weeks with Neulasta support.  First dose June 22, 2018 for disease recurrence.  Status post 5 cycles.  Starting from cycle #2 her dose of carboplatin will be reduced to AUC of 4 and etoposide 80 mg/M2 on days 1, 2 and 3 in addition to the regular dose of Tecentriq.  CURRENT THERAPY: Maintenance treatment with single agent Tecentriq 1200 mg IV every 3 weeks.  Status post 34 cycles.  INTERVAL HISTORY: Debra Kidd 60 y.o. female returns to the clinic today for follow-up visit.  The patient is feeling fine today with no concerning complaints except for mild itching and she is currently on hydroxyzine on as-needed basis.  She denied having any current chest pain, shortness of breath, cough or hemoptysis.  She denied having any fever or chills.  She has no nausea, vomiting, diarrhea or constipation.  She denied having any significant weight loss or night  sweats.  She is here today for evaluation before starting cycle #35 of her maintenance treatment with Tecentriq.  MEDICAL HISTORY: Past Medical History:  Diagnosis Date  . 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 Blood type, Rh positive   . Cancer ([place])   . Chronic pain syndrome   . Depression 08/07/2016  . Encounter for antineoplastic chemotherapy 07/17/2016  . History of external beam radiation therapy 11/19/16-12/02/16   brain 25 Gy in 10 fractions  . Hyperlipidemia   . Hypertension   . Hypertension 08/07/2016  . Osteoporosis   . Small cell lung cancer ([place]) dx'd 05/2016    ALLERGIES:  is allergic to codeine, motrin [ibuprofen], thiazide-type diuretics, and vicodin [hydrocodone-acetaminophen].  MEDICATIONS:  Current Outpatient Medications  Medication Sig Dispense Refill  . clopidogrel (PLAVIX) 75 MG tablet Take 1 tablet (75 mg total) by mouth daily. 30 tablet 0  . diazepam (VALIUM) 5 MG tablet Take 5 mg by mouth 3 (three) times daily as needed for anxiety.   2  . hydrOXYzine (ATARAX/VISTARIL) 10 MG tablet Take 1 tablet (10 mg total) by mouth 3 (three) times daily as needed. 90 tablet 1  . lidocaine-prilocaine (EMLA) cream Apply 1 application topically as needed. 30 g 0  . oxyCODONE-acetaminophen (PERCOCET) 5-325 MG tablet Take 1 tablet by mouth every 6 (six) hours as needed for severe pain. 30 tablet 0  . potassium chloride 20 MEQ/15ML (10%) SOLN Take 15 mLs (20 mEq total) by mouth daily. Please take 15 mL  by mouth once daily for 7 days. 105 mL 0  . prochlorperazine (COMPAZINE) 10 MG tablet Take 1 tablet (10 mg total) by mouth every 6 (six) hours as needed for nausea or vomiting. 30 tablet 2  . traMADol (ULTRAM) 50 MG tablet Take 50 mg by mouth every 6 (six) hours as needed for moderate pain.      No current facility-administered medications for this visit.    SURGICAL HISTORY:  Past Surgical History:  Procedure Laterality Date  . ABDOMINAL HYSTERECTOMY  1981  .  APPENDECTOMY     at age 70  . EYE SURGERY     left  . IR GENERIC HISTORICAL  06/03/2016   IR FLUORO GUIDE PORT INSERTION RIGHT 06/03/2016 [facility]  . IR GENERIC HISTORICAL  06/03/2016   IR US GUIDE VASC ACCESS RIGHT 06/03/2016 [facility]  . SKIN CANCER EXCISION     basal cell carcinoma L side of face    REVIEW OF SYSTEMS:  A comprehensive review of systems was negative except for: Integument/breast: positive for dryness and pruritus   PHYSICAL EXAMINATION: General appearance: alert, cooperative and no distress Head: Normocephalic, without obvious abnormality, atraumatic Neck: no adenopathy, no JVD, supple, symmetrical, trachea midline and thyroid not enlarged, symmetric, no tenderness/mass/nodules Lymph nodes: Cervical, supraclavicular, and axillary nodes normal. Resp: clear to auscultation bilaterally Back: symmetric, no curvature. ROM normal. No CVA tenderness. Cardio: regular rate and rhythm, S1, S2 normal, no murmur, click, rub or gallop GI: soft, non-tender; bowel sounds normal; no masses,  no organomegaly Extremities: extremities normal, atraumatic, no cyanosis or edema  ECOG PERFORMANCE STATUS: 1 - Symptomatic but completely ambulatory  Blood pressure (!) 141/84, pulse 94, temperature 98.1 F (36.7 C), temperature source Tympanic, resp. rate 20, height 5\' 3"  (1.6 m), weight 121 lb 12.8 oz (55.2 kg), SpO2 100 %.  LABORATORY DATA: Lab Results  Component Value Date   WBC 7.8 10/03/2020   HGB 12.4 10/03/2020   HCT 36.9 10/03/2020   MCV 106.0 (H) 10/03/2020   PLT 268 10/03/2020      Chemistry      Component Value Date/Time   NA 139 09/13/2020 1126   NA 141 08/01/2017 0835   K 3.2 (L) 09/13/2020 1126   K 3.5 08/01/2017 0835   CL 105 09/13/2020 1126   CO2 27 09/13/2020 1126   CO2 25 08/01/2017 0835   BUN 5 (L) 09/13/2020 1126   BUN 8.0 08/01/2017 0835   CREATININE 0.77 09/13/2020 1126   CREATININE 0.9 08/01/2017 0835      Component Value Date/Time   CALCIUM  8.7 (L) 09/13/2020 1126   CALCIUM 9.9 08/01/2017 0835   ALKPHOS 114 09/13/2020 1126   ALKPHOS 142 08/01/2017 0835   AST 11 (L) 09/13/2020 1126   AST 15 08/01/2017 0835   ALT 11 09/13/2020 1126   ALT 19 08/01/2017 0835   BILITOT 0.4 09/13/2020 1126   BILITOT 0.35 08/01/2017 0835       RADIOGRAPHIC STUDIES: CT Chest W Contrast  Result Date: 09/07/2020 CLINICAL DATA:  Metastatic small-cell lung cancer restaging, ongoing chemotherapy EXAM: CT CHEST, ABDOMEN, AND PELVIS WITH CONTRAST TECHNIQUE: Multidetector CT imaging of the chest, abdomen and pelvis was performed following the standard protocol during bolus administration of intravenous contrast. CONTRAST:  124mL OMNIPAQUE IOHEXOL 300 MG/ML SOLN, additional oral enteric contrast COMPARISON:  05/29/2020 FINDINGS: CT CHEST FINDINGS Cardiovascular: Right chest port catheter. Aortic atherosclerosis. Normal heart size. Left coronary artery calcifications. No pericardial effusion. Mediastinum/Nodes: No enlarged mediastinal, hilar,  or axillary lymph nodes. Thyroid gland, trachea, and esophagus demonstrate no significant findings. Lungs/Pleura: Unchanged appearance of the right pulmonary apex with post treatment/radiation consolidation and fibrosis (series 4, image 28). No pleural effusion or pneumothorax. Musculoskeletal: No chest wall mass or suspicious bone lesions identified. Unchanged subtle wedge deformities of T6 through T8. CT ABDOMEN PELVIS FINDINGS Hepatobiliary: No solid liver abnormality is seen. Unchanged focal fatty deposition adjacent to the falciform ligament (series 2, image 56). No gallstones, gallbladder wall thickening, or biliary dilatation. Pancreas: Unremarkable. No pancreatic ductal dilatation or surrounding inflammatory changes. Spleen: Normal in size without significant abnormality. Adrenals/Urinary Tract: Adrenal glands are unremarkable. Kidneys are normal, without renal calculi, solid lesion, or hydronephrosis. Bladder is  unremarkable. Stomach/Bowel: Stomach is within normal limits. Appendix is not clearly visualized and may be surgically absent. No evidence of bowel wall thickening, distention, or inflammatory changes. Vascular/Lymphatic: Aortic atherosclerosis. No enlarged abdominal or pelvic lymph nodes. Reproductive: Status post hysterectomy. Other: No abdominal wall hernia or abnormality. No abdominopelvic ascites. Musculoskeletal: No acute or significant osseous findings. Unchanged superior endplate deformity of L2. IMPRESSION: 1. Unchanged appearance of the right pulmonary apex with post treatment/radiation consolidation and fibrosis. 2. No evidence of recurrent or metastatic disease within the chest, abdomen, or pelvis. 3. Coronary artery disease. Aortic Atherosclerosis (ICD10-I70.0). Electronically Signed   By: Eddie Candle M.D.   On: 09/07/2020 15:03   CT Abdomen Pelvis W Contrast  Result Date: 09/07/2020 CLINICAL DATA:  Metastatic small-cell lung cancer restaging, ongoing chemotherapy EXAM: CT CHEST, ABDOMEN, AND PELVIS WITH CONTRAST TECHNIQUE: Multidetector CT imaging of the chest, abdomen and pelvis was performed following the standard protocol during bolus administration of intravenous contrast. CONTRAST:  17mL OMNIPAQUE IOHEXOL 300 MG/ML SOLN, additional oral enteric contrast COMPARISON:  05/29/2020 FINDINGS: CT CHEST FINDINGS Cardiovascular: Right chest port catheter. Aortic atherosclerosis. Normal heart size. Left coronary artery calcifications. No pericardial effusion. Mediastinum/Nodes: No enlarged mediastinal, hilar, or axillary lymph nodes. Thyroid gland, trachea, and esophagus demonstrate no significant findings. Lungs/Pleura: Unchanged appearance of the right pulmonary apex with post treatment/radiation consolidation and fibrosis (series 4, image 28). No pleural effusion or pneumothorax. Musculoskeletal: No chest wall mass or suspicious bone lesions identified. Unchanged subtle wedge deformities of T6  through T8. CT ABDOMEN PELVIS FINDINGS Hepatobiliary: No solid liver abnormality is seen. Unchanged focal fatty deposition adjacent to the falciform ligament (series 2, image 56). No gallstones, gallbladder wall thickening, or biliary dilatation. Pancreas: Unremarkable. No pancreatic ductal dilatation or surrounding inflammatory changes. Spleen: Normal in size without significant abnormality. Adrenals/Urinary Tract: Adrenal glands are unremarkable. Kidneys are normal, without renal calculi, solid lesion, or hydronephrosis. Bladder is unremarkable. Stomach/Bowel: Stomach is within normal limits. Appendix is not clearly visualized and may be surgically absent. No evidence of bowel wall thickening, distention, or inflammatory changes. Vascular/Lymphatic: Aortic atherosclerosis. No enlarged abdominal or pelvic lymph nodes. Reproductive: Status post hysterectomy. Other: No abdominal wall hernia or abnormality. No abdominopelvic ascites. Musculoskeletal: No acute or significant osseous findings. Unchanged superior endplate deformity of L2. IMPRESSION: 1. Unchanged appearance of the right pulmonary apex with post treatment/radiation consolidation and fibrosis. 2. No evidence of recurrent or metastatic disease within the chest, abdomen, or pelvis. 3. Coronary artery disease. Aortic Atherosclerosis (ICD10-I70.0). Electronically Signed   By: Eddie Candle M.D.   On: 09/07/2020 15:03    ASSESSMENT AND PLAN:  This is a very pleasant 60 years old white female with extensive stage small cell lung cancer status post systemic chemotherapy with carbo platinum and etoposide for 6 cycles  and the patient rotated her treatment well except for chemotherapy-induced anemia and requirement for PRBCs and platelet transfusion. She had significant improvement in her disease with the chemotherapy. She also had prophylactic cranial irradiation. She also underwent stereotactic radiotherapy to progressive right upper lobe pulmonary  nodule. The patient has been in observation for close to 2 years. Repeat CT scan of the chest, abdomen and pelvis performed recently showed evidence for disease progression in the abdomen. The patient was started on systemic chemotherapy again with carboplatin, etoposide and Tecentriq status post 38 cycles.  Starting from cycle #5 the patient is treated with maintenance single agent Tecentriq. She has been tolerating this treatment well with no concerning adverse effect except for mild fatigue. The patient continues to tolerate her treatment with maintenance Tecentriq fairly well. I recommended for her to proceed with cycle #39 today as planned. She will come back for follow-up visit in 3 weeks for evaluation before starting cycle #40. The patient was advised to call immediately if she has any concerning symptoms in the interval. The patient voices understanding of current disease status and treatment options and is in agreement with the current care plan. All questions were answered. The patient knows to call the clinic with any problems, questions or concerns. We can certainly see the patient much sooner if necessary.  Disclaimer: This note was dictated with voice recognition software. Similar sounding words can inadvertently be transcribed and may not be corrected upon review.

## 2020-10-04 ENCOUNTER — Telehealth: Payer: Self-pay | Admitting: Internal Medicine

## 2020-10-04 NOTE — Telephone Encounter (Signed)
Per 2/1 los, next appt already scheduled

## 2020-10-05 ENCOUNTER — Telehealth: Payer: Self-pay

## 2020-10-05 ENCOUNTER — Other Ambulatory Visit: Payer: Self-pay | Admitting: Internal Medicine

## 2020-10-05 DIAGNOSIS — C3491 Malignant neoplasm of unspecified part of right bronchus or lung: Secondary | ICD-10-CM

## 2020-10-05 MED ORDER — OXYCODONE-ACETAMINOPHEN 5-325 MG PO TABS: 1.0000 | ORAL_TABLET | Freq: Four times a day (QID) | ORAL | 0 refills | Status: DC | PRN

## 2020-10-05 NOTE — Telephone Encounter (Signed)
Pt is requesting a refill of of Percocet.

## 2020-10-05 NOTE — Telephone Encounter (Signed)
She has not requested any of her pain medication for close to 2 years now but I did send a refill. Thank you.

## 2020-10-25 ENCOUNTER — Inpatient Hospital Stay: Payer: Medicare Other

## 2020-10-25 ENCOUNTER — Other Ambulatory Visit: Payer: Self-pay

## 2020-10-25 ENCOUNTER — Inpatient Hospital Stay (HOSPITAL_BASED_OUTPATIENT_CLINIC_OR_DEPARTMENT_OTHER): Payer: Medicare Other | Admitting: Internal Medicine

## 2020-10-25 VITALS — BP 98/75 | HR 79 | Temp 97.8°F | Resp 13 | Ht 63.0 in | Wt 121.7 lb

## 2020-10-25 DIAGNOSIS — C3491 Malignant neoplasm of unspecified part of right bronchus or lung: Secondary | ICD-10-CM | POA: Diagnosis not present

## 2020-10-25 DIAGNOSIS — C3490 Malignant neoplasm of unspecified part of unspecified bronchus or lung: Secondary | ICD-10-CM | POA: Diagnosis not present

## 2020-10-25 DIAGNOSIS — Z5112 Encounter for antineoplastic immunotherapy: Secondary | ICD-10-CM | POA: Diagnosis not present

## 2020-10-25 DIAGNOSIS — R5383 Other fatigue: Secondary | ICD-10-CM

## 2020-10-25 LAB — CMP (CANCER CENTER ONLY)
ALT: 12 U/L (ref 0–44)
AST: 19 U/L (ref 15–41)
Albumin: 3.3 g/dL — ABNORMAL LOW (ref 3.5–5.0)
Alkaline Phosphatase: 112 U/L (ref 38–126)
Anion gap: 8 (ref 5–15)
BUN: <4 mg/dL — ABNORMAL LOW (ref 8–23)
CO2: 23 mmol/L (ref 22–32)
Calcium: 8.5 mg/dL — ABNORMAL LOW (ref 8.9–10.3)
Chloride: 105 mmol/L (ref 98–111)
Creatinine: 0.75 mg/dL (ref 0.44–1.00)
GFR, Estimated: >60 mL/min (ref >60)
Glucose, Bld: 122 mg/dL — ABNORMAL HIGH (ref 70–99)
Potassium: 3.4 mmol/L — ABNORMAL LOW (ref 3.5–5.1)
Sodium: 136 mmol/L (ref 135–145)
Total Bilirubin: 0.3 mg/dL (ref 0.3–1.2)
Total Protein: 6.6 g/dL (ref 6.5–8.1)

## 2020-10-25 LAB — CBC WITH DIFFERENTIAL (CANCER CENTER ONLY)
Abs Immature Granulocytes: 0.02 10*3/uL (ref 0.00–0.07)
Basophils Absolute: 0 10*3/uL (ref 0.0–0.1)
Basophils Relative: 0 %
Eosinophils Absolute: 0.2 10*3/uL (ref 0.0–0.5)
Eosinophils Relative: 3 %
HCT: 34.9 % — ABNORMAL LOW (ref 36.0–46.0)
Hemoglobin: 12.2 g/dL (ref 12.0–15.0)
Immature Granulocytes: 0 %
Lymphocytes Relative: 23 %
Lymphs Abs: 1.9 10*3/uL (ref 0.7–4.0)
MCH: 36.7 pg — ABNORMAL HIGH (ref 26.0–34.0)
MCHC: 35 g/dL (ref 30.0–36.0)
MCV: 105.1 fL — ABNORMAL HIGH (ref 80.0–100.0)
Monocytes Absolute: 0.5 10*3/uL (ref 0.1–1.0)
Monocytes Relative: 6 %
Neutro Abs: 5.6 10*3/uL (ref 1.7–7.7)
Neutrophils Relative %: 68 %
Platelet Count: 262 10*3/uL (ref 150–400)
RBC: 3.32 MIL/uL — ABNORMAL LOW (ref 3.87–5.11)
RDW: 12.8 % (ref 11.5–15.5)
WBC Count: 8.2 10*3/uL (ref 4.0–10.5)
nRBC: 0 % (ref 0.0–0.2)

## 2020-10-25 LAB — TSH: TSH: 1.678 u[IU]/mL (ref 0.308–3.960)

## 2020-10-25 MED ORDER — SODIUM CHLORIDE 0.9 % IV SOLN
Freq: Once | INTRAVENOUS | Status: AC
  Filled 2020-10-25: qty 250

## 2020-10-25 MED ORDER — SODIUM CHLORIDE 0.9% FLUSH
10.0000 mL | INTRAVENOUS | Status: DC | PRN
  Administered 2020-10-25: 10 mL
  Filled 2020-10-25: qty 10

## 2020-10-25 MED ORDER — HEPARIN SOD (PORK) LOCK FLUSH 100 UNIT/ML IV SOLN
500.0000 [IU] | Freq: Once | INTRAVENOUS | Status: AC | PRN
  Administered 2020-10-25: 500 [IU]
  Filled 2020-10-25: qty 5

## 2020-10-25 MED ORDER — SODIUM CHLORIDE 0.9 % IV SOLN
1200.0000 mg | Freq: Once | INTRAVENOUS | Status: AC
  Administered 2020-10-25: 1200 mg via INTRAVENOUS
  Filled 2020-10-25: qty 20

## 2020-10-25 NOTE — Progress Notes (Signed)
[place] Telephone:(336) [phone]   Fax:(336) [phone]  OFFICE PROGRESS NOTE  [name], MD [address] [place] [address]  DIAGNOSIS: Extensive stage (T2a, N3, [name], large right anterior mediastinal and supraclavicular lymphadenopathy as well as pancreatic and splenic metastasis diagnosed in September 2017.  The patient had disease progression in October 2019.  PRIOR THERAPY:  1) Systemic chemotherapy was carboplatin for AUC of 5 on day 1 and etoposide 100 MG/M2 on days 1, 2 and 3 with Neulasta support. Status post 6 cycles with significant response of her disease. 2) Prophylactic cranial irradiation under the care of Dr. Sondra Come on 12/02/2016. 3) stereotactic radiotherapy to the recurrent right upper lobe pulmonary nodule under the care of Dr. Sondra Come completed November 10, 2017. 4) Retreatment with systemic chemotherapy with carboplatin for AUC of 5 on day 1 and etoposide 100 mg/M2 on days 1, 2 and 3 as well as Tecentriq (Atezolizumab) 1200 mg IV every 3 weeks with Neulasta support.  First dose June 22, 2018 for disease recurrence.  Status post 5 cycles.  Starting from cycle #2 her dose of carboplatin will be reduced to AUC of 4 and etoposide 80 mg/M2 on days 1, 2 and 3 in addition to the regular dose of Tecentriq.  CURRENT THERAPY: Maintenance treatment with single agent Tecentriq 1200 mg IV every 3 weeks.  Status post 35 cycles.  INTERVAL HISTORY: Debra Kidd 60 y.o. female returns to the clinic today for follow-up visit.  The patient is feeling fine today with no concerning complaints except for the weakness in her lower extremities.  She has occasional chest pain but no significant shortness of breath, cough or hemoptysis.  She denied having any nausea, vomiting, diarrhea or constipation.  She continues to have intermittent itching from the treatment with immunotherapy.  The patient is here today  for evaluation before starting cycle #40 of her treatment.  MEDICAL HISTORY: Past Medical History:  Diagnosis Date  . 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 Blood type, Rh positive   . Cancer ([place])   . Chronic pain syndrome   . Depression 08/07/2016  . Encounter for antineoplastic chemotherapy 07/17/2016  . History of external beam radiation therapy 11/19/16-12/02/16   brain 25 Gy in 10 fractions  . Hyperlipidemia   . Hypertension   . Hypertension 08/07/2016  . Osteoporosis   . Small cell lung cancer ([place]) dx'd 05/2016    ALLERGIES:  is allergic to codeine, motrin [ibuprofen], thiazide-type diuretics, and vicodin [hydrocodone-acetaminophen].  MEDICATIONS:  Current Outpatient Medications  Medication Sig Dispense Refill  . clopidogrel (PLAVIX) 75 MG tablet Take 1 tablet (75 mg total) by mouth daily. 30 tablet 0  . diazepam (VALIUM) 5 MG tablet Take 5 mg by mouth 3 (three) times daily as needed for anxiety.   2  . hydrOXYzine (ATARAX/VISTARIL) 10 MG tablet Take 1 tablet (10 mg total) by mouth 3 (three) times daily as needed. 90 tablet 1  . lidocaine-prilocaine (EMLA) cream Apply 1 application topically as needed. 30 g 0  . oxyCODONE-acetaminophen (PERCOCET) 5-325 MG tablet Take 1 tablet by mouth every 6 (six) hours as needed for severe pain. 30 tablet 0  . potassium chloride 20 MEQ/15ML (10%) SOLN Take 15 mLs (20 mEq total) by mouth 2 (two) times daily. 210 mL 0  . prochlorperazine (COMPAZINE) 10 MG tablet Take 1 tablet (  10 mg total) by mouth every 6 (six) hours as needed for nausea or vomiting. 30 tablet 2  . traMADol (ULTRAM) 50 MG tablet Take 50 mg by mouth every 6 (six) hours as needed for moderate pain.      No current facility-administered medications for this visit.    SURGICAL HISTORY:  Past Surgical History:  Procedure Laterality Date  . ABDOMINAL HYSTERECTOMY  1981  . APPENDECTOMY     at age 53  . EYE SURGERY     left  . IR GENERIC HISTORICAL  06/03/2016    IR FLUORO GUIDE PORT INSERTION RIGHT 06/03/2016 [facility]  . IR GENERIC HISTORICAL  06/03/2016   IR US GUIDE VASC ACCESS RIGHT 06/03/2016 [facility]  . SKIN CANCER EXCISION     basal cell carcinoma L side of face    REVIEW OF SYSTEMS:  A comprehensive review of systems was negative except for: Constitutional: positive for fatigue Integument/breast: positive for dryness and pruritus Musculoskeletal: positive for muscle weakness   PHYSICAL EXAMINATION: General appearance: alert, cooperative and no distress Head: Normocephalic, without obvious abnormality, atraumatic Neck: no adenopathy, no JVD, supple, symmetrical, trachea midline and thyroid not enlarged, symmetric, no tenderness/mass/nodules Lymph nodes: Cervical, supraclavicular, and axillary nodes normal. Resp: clear to auscultation bilaterally Back: symmetric, no curvature. ROM normal. No CVA tenderness. Cardio: regular rate and rhythm, S1, S2 normal, no murmur, click, rub or gallop GI: soft, non-tender; bowel sounds normal; no masses,  no organomegaly Extremities: extremities normal, atraumatic, no cyanosis or edema  ECOG PERFORMANCE STATUS: 1 - Symptomatic but completely ambulatory  Blood pressure 98/75, pulse 79, temperature 97.8 F (36.6 C), temperature source Tympanic, resp. rate 13, height 5\' 3"  (1.6 m), weight 121 lb 11.2 oz (55.2 kg), SpO2 100 %.  LABORATORY DATA: Lab Results  Component Value Date   WBC 8.2 10/25/2020   HGB 12.2 10/25/2020   HCT 34.9 (L) 10/25/2020   MCV 105.1 (H) 10/25/2020   PLT 262 10/25/2020      Chemistry      Component Value Date/Time   NA 139 10/03/2020 0845   NA 141 08/01/2017 0835   K 3.0 (L) 10/03/2020 0845   K 3.5 08/01/2017 0835   CL 105 10/03/2020 0845   CO2 24 10/03/2020 0845   CO2 25 08/01/2017 0835   BUN 5 (L) 10/03/2020 0845   BUN 8.0 08/01/2017 0835   CREATININE 0.79 10/03/2020 0845   CREATININE 0.9 08/01/2017 0835      Component Value Date/Time   CALCIUM 8.5 (L)  10/03/2020 0845   CALCIUM 9.9 08/01/2017 0835   ALKPHOS 111 10/03/2020 0845   ALKPHOS 142 08/01/2017 0835   AST 17 10/03/2020 0845   AST 15 08/01/2017 0835   ALT 16 10/03/2020 0845   ALT 19 08/01/2017 0835   BILITOT 0.5 10/03/2020 0845   BILITOT 0.35 08/01/2017 0835       RADIOGRAPHIC STUDIES: No results found.  ASSESSMENT AND PLAN:  This is a very pleasant 60 years old white female with extensive stage small cell lung cancer status post systemic chemotherapy with carbo platinum and etoposide for 6 cycles and the patient rotated her treatment well except for chemotherapy-induced anemia and requirement for PRBCs and platelet transfusion. She had significant improvement in her disease with the chemotherapy. She also had prophylactic cranial irradiation. She also underwent stereotactic radiotherapy to progressive right upper lobe pulmonary nodule. The patient has been in observation for close to 2 years. Repeat CT scan of the chest, abdomen  and pelvis performed recently showed evidence for disease progression in the abdomen. The patient was started on systemic chemotherapy again with carboplatin, etoposide and Tecentriq status post 39 cycles.  Starting from cycle #5 the patient is treated with maintenance single agent Tecentriq. The patient continues to tolerate her treatment with Tecentriq fairly well. I recommended for her to proceed with cycle #40 today as planned. She will come back for follow-up visit in 3 weeks for evaluation with repeat CT scan of the chest, abdomen and pelvis for restaging of her disease. She was advised to call immediately if she has any concerning symptoms in the interval. The patient voices understanding of current disease status and treatment options and is in agreement with the current care plan. All questions were answered. The patient knows to call the clinic with any problems, questions or concerns. We can certainly see the patient much sooner if  necessary.  Disclaimer: This note was dictated with voice recognition software. Similar sounding words can inadvertently be transcribed and may not be corrected upon review.

## 2020-10-25 NOTE — Patient Instructions (Signed)
Implanted Port Insertion, Care After This sheet gives you information about how to care for yourself after your procedure. Your health care provider may also give you more specific instructions. If you have problems or questions, contact your health care provider. What can I expect after the procedure? After the procedure, it is common to have:  Discomfort at the port insertion site.  Bruising on the skin over the port. This should improve over 3-4 days. Follow these instructions at home: Port care  After your port is placed, you will get a manufacturer's information card. The card has information about your port. Keep this card with you at all times.  Take care of the port as told by your health care provider. Ask your health care provider if you or a family member can get training for taking care of the port at home. A home health care nurse may also take care of the port.  Make sure to remember what type of port you have. Incision care  Follow instructions from your health care provider about how to take care of your port insertion site. Make sure you: ? Wash your hands with soap and water before and after you change your bandage (dressing). If soap and water are not available, use hand sanitizer. ? Change your dressing as told by your health care provider. ? Leave stitches (sutures), skin glue, or adhesive strips in place. These skin closures may need to stay in place for 2 weeks or longer. If adhesive strip edges start to loosen and curl up, you may trim the loose edges. Do not remove adhesive strips completely unless your health care provider tells you to do that.  Check your port insertion site every day for signs of infection. Check for: ? Redness, swelling, or pain. ? Fluid or blood. ? Warmth. ? Pus or a bad smell.      Activity  Return to your normal activities as told by your health care provider. Ask your health care provider what activities are safe for you.  Do not  lift anything that is heavier than 10 lb (4.5 kg), or the limit that you are told, until your health care provider says that it is safe. General instructions  Take over-the-counter and prescription medicines only as told by your health care provider.  Do not take baths, swim, or use a hot tub until your health care provider approves. Ask your health care provider if you may take showers. You may only be allowed to take sponge baths.  Do not drive for 24 hours if you were given a sedative during your procedure.  Wear a medical alert bracelet in case of an emergency. This will tell any health care providers that you have a port.  Keep all follow-up visits as told by your health care provider. This is important. Contact a health care provider if:  You cannot flush your port with saline as directed, or you cannot draw blood from the port.  You have a fever or chills.  You have redness, swelling, or pain around your port insertion site.  You have fluid or blood coming from your port insertion site.  Your port insertion site feels warm to the touch.  You have pus or a bad smell coming from the port insertion site. Get help right away if:  You have chest pain or shortness of breath.  You have bleeding from your port that you cannot control. Summary  Take care of the port as told by your   health care provider. Keep the manufacturer's information card with you at all times.  Change your dressing as told by your health care provider.  Contact a health care provider if you have a fever or chills or if you have redness, swelling, or pain around your port insertion site.  Keep all follow-up visits as told by your health care provider. This information is not intended to replace advice given to you by your health care provider. Make sure you discuss any questions you have with your health care provider. Document Revised: 03/17/2018 Document Reviewed: 03/17/2018 Elsevier Patient Education   2021 [employer] Inc.  

## 2020-10-25 NOTE — Patient Instructions (Signed)
[facility] Discharge Instructions for Patients Receiving Chemotherapy  Today you received the following chemotherapy agents: atezolizumab.  To help prevent nausea and vomiting after your treatment, we encourage you to take your nausea medication as directed.   If you develop nausea and vomiting that is not controlled by your nausea medication, call the clinic.   BELOW ARE SYMPTOMS THAT SHOULD BE REPORTED IMMEDIATELY:  *FEVER GREATER THAN 100.5 F  *CHILLS WITH OR WITHOUT FEVER  NAUSEA AND VOMITING THAT IS NOT CONTROLLED WITH YOUR NAUSEA MEDICATION  *UNUSUAL SHORTNESS OF BREATH  *UNUSUAL BRUISING OR BLEEDING  TENDERNESS IN MOUTH AND THROAT WITH OR WITHOUT PRESENCE OF ULCERS  *URINARY PROBLEMS  *BOWEL PROBLEMS  UNUSUAL RASH Items with * indicate a potential emergency and should be followed up as soon as possible.  Feel free to call the clinic should you have any questions or concerns. The clinic phone number is [phone].  Please show the [facility] at check-in to the Emergency Department and triage nurse.

## 2020-11-10 NOTE — Progress Notes (Signed)
[facility] OFFICE PROGRESS NOTE  [name], MD [address] [place] [address]  DIAGNOSIS: Extensive stage (T2a, N3, [name], large right anterior mediastinal and supraclavicular lymphadenopathy as well as pancreatic and splenic metastasis diagnosed in September 2017. The patient had disease progression in October 2019.  PRIOR THERAPY: 1) Systemic chemotherapy was carboplatin for AUC of 5 on day 1 and etoposide 100 MG/M2 on days 1, 2 and 3 with Neulasta support. Status post 6 cycles with significant response of her disease. 2) Prophylactic cranial irradiation under the care of Dr. Sondra Come on 12/02/2016. 3) stereotactic radiotherapy to the recurrent right upper lobe pulmonary nodule under the care of Dr. Sondra Come completed November 10, 2017. 4)Retreatment with systemic chemotherapy with carboplatin for AUC of 5 on day 1 and etoposide 100 mg/M2 on days 1, 2 and 3 as well as Tecentriq (Atezolizumab) 1200 mg IV every 3 weeks with Neulasta support. First dose June 22, 2018 for disease recurrence. Status post 5 cycles. Starting from cycle #2 her dose of carboplatin will be reduced to AUC of 4 and etoposide 80 mg/M2 on days 1, 2 and 3 in addition to the regular dose of Tecentriq  CURRENT THERAPY: Maintenance treatment with single agent Tecentriq 1200 mg IV every 3 weeks. Status post40cycles.   INTERVAL HISTORY: Debra Kidd 60 y.o. female returns to the clinic today for a follow up visit. The patient is feeling fine today without any concerning complaints except she is requesting a refill for oxycodone. She states she takes this 2x per week for back pain.  The patient continues to tolerate treatment with immunotherapy with Tecentriqwell without any adverse effects exceptoccasionalitching for which she uses atarax.Her itching has been better recentlythough.Denies any fever, chills,or night sweats. Her weight is  stable.She uses carnation instant breakfast but does not drink boost or ensure.Denies any chest pain or hemoptysisbut endorses her baseline coughand dyspnea on exertionwhich is unchanged.Denies any vomiting or nausea. Shedeniesconstipation and diarrhea.She recently had a restaging CT scan of the chest, abdomen, and pelvis. The patient is here for evaluation and to review her scan results prior to starting cycle #41.   MEDICAL HISTORY: Past Medical History:  Diagnosis Date  . 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 Blood type, Rh positive   . Cancer ([facility])   . Chronic pain syndrome   . Depression 08/07/2016  . Encounter for antineoplastic chemotherapy 07/17/2016  . History of external beam radiation therapy 11/19/16-12/02/16   brain 25 Gy in 10 fractions  . Hyperlipidemia   . Hypertension   . Hypertension 08/07/2016  . Osteoporosis   . Small cell lung cancer ([facility]) dx'd 05/2016    ALLERGIES:  is allergic to codeine, motrin [ibuprofen], thiazide-type diuretics, and vicodin [hydrocodone-acetaminophen].  MEDICATIONS:  Current Outpatient Medications  Medication Sig Dispense Refill  . clopidogrel (PLAVIX) 75 MG tablet Take 1 tablet (75 mg total) by mouth daily. 30 tablet 0  . diazepam (VALIUM) 5 MG tablet Take 5 mg by mouth 3 (three) times daily as needed for anxiety.   2  . hydrOXYzine (ATARAX/VISTARIL) 10 MG tablet Take 1 tablet (10 mg total) by mouth 3 (three) times daily as needed. 90 tablet 1  . lidocaine-prilocaine (EMLA) cream Apply 1 application topically as needed. 30 g 0  . oxyCODONE-acetaminophen (PERCOCET) 5-325 MG tablet Take 1 tablet by mouth every 6 (six) hours as needed for  severe pain. 30 tablet 0  . potassium chloride 20 MEQ/15ML (10%) SOLN Take 15 mLs (20 mEq total) by mouth 2 (two) times daily. 210 mL 0  . prochlorperazine (COMPAZINE) 10 MG tablet Take 1 tablet (10 mg total) by mouth every 6 (six) hours as needed for nausea or vomiting. 30 tablet 2  .  traMADol (ULTRAM) 50 MG tablet Take 50 mg by mouth every 6 (six) hours as needed for moderate pain.      No current facility-administered medications for this visit.    SURGICAL HISTORY:  Past Surgical History:  Procedure Laterality Date  . ABDOMINAL HYSTERECTOMY  1981  . APPENDECTOMY     at age 56  . EYE SURGERY     left  . IR GENERIC HISTORICAL  06/03/2016   IR FLUORO GUIDE PORT INSERTION RIGHT 06/03/2016 [facility]INTER[facility]  . IR GENERIC HISTORICAL  06/03/2016   IR US GUIDE VASC ACCESS RIGHT 06/03/2016 [facility]  . SKIN CANCER EXCISION     basal cell carcinoma L side of face    REVIEW OF SYSTEMS:   Review of Systems  Constitutional: Negative for appetite change, chills, fatigue, fever and unexpected weight change.  HENT: Negative for mouth sores, nosebleeds, sore throat and trouble swallowing.  Eyes: Negative for eye problems and icterus.  Respiratory:Positive for baseline coughand shortness of breath with exertion.Negative for hemoptysis and wheezing.  Cardiovascular: Negative for chest pain and leg swelling.  Gastrointestinal:Negative for abdominal pain, constipation, nausea and vomiting.  Genitourinary: Negative for bladder incontinence, difficulty urinating, dysuria, frequency and hematuria.  Musculoskeletal:Positive for back pain. Negative for gait problem, neck pain and neck stiffness.  Skin:Positive for itching(improved from prior).Negative for rash.  Neurological:Lower extremity weakness, gradually worsening L>R due to prior stoke.Negative for dizziness, headaches, [name], depression and sleep disturbance. The patient is not nervous/anxious.   PHYSICAL EXAMINATION:  There were no vitals taken for this visit.  ECOG PERFORMANCE STATUS: 1 - Symptomatic but completely ambulatory  Physical Exam  Constitutional:  Oriented to person, place, and time andchronically ill appearing femaleand in no distress.  HENT:  Head: Normocephalic and atraumatic.  Mouth/Throat: Oropharynx is clear and moist. No oropharyngeal exudate.  Eyes: Conjunctivae are normal. Right eye exhibits no discharge. Left eye exhibits no discharge. No scleral icterus.  Neck: Normal range of motion. Neck supple.  Cardiovascular: Normal rate, regular rhythm, normal heart sounds and intact distal pulses.  Pulmonary/Chest: Effort normal and breath sounds normal. No respiratory distress. No wheezes. No rales.  Abdominal: Soft. Bowel sounds are normal. Exhibits no distension and no mass. There is no tenderness.  Musculoskeletal: Normal range of motion. Exhibits no edema.  Lymphadenopathy:  No cervical adenopathy.  Neurological: Alert and oriented to person, place, and time. Exhibits normal muscle tone. Positive for limping due to history of stroke. Skin: Skin is warm and dry. No rash noted. Not diaphoretic. No erythema. No pallor.  Psychiatric: Mood, memory and judgment normal.  Vitals reviewed.  LABORATORY DATA: Lab Results  Component Value Date   WBC 8.2 10/25/2020   HGB 12.2 10/25/2020   HCT 34.9 (L) 10/25/2020   MCV 105.1 (H) 10/25/2020   PLT 262 10/25/2020      Chemistry      Component Value Date/Time   NA 136 10/25/2020 1002   NA 141 08/01/2017 0835   K 3.4 (L) 10/25/2020 1002   K 3.5 08/01/2017 0835   CL  105 10/25/2020 1002   CO2 23 10/25/2020 1002   CO2 25 08/01/2017 0835   BUN <4 (L) 10/25/2020 1002   BUN 8.0 08/01/2017 0835   CREATININE 0.75 10/25/2020 1002   CREATININE 0.9 08/01/2017 0835      Component Value Date/Time   CALCIUM 8.5 (L) 10/25/2020 1002   CALCIUM 9.9 08/01/2017 0835   ALKPHOS 112 10/25/2020 1002   ALKPHOS 142 08/01/2017 0835   AST 19 10/25/2020 1002   AST 15 08/01/2017 0835   ALT 12 10/25/2020 1002   ALT 19 08/01/2017 0835   BILITOT 0.3 10/25/2020 1002   BILITOT 0.35 08/01/2017 0835        RADIOGRAPHIC STUDIES:  No results found.   ASSESSMENT/PLAN:  This is a very pleasant 60 year old Caucasian female with extensive stage small cell lung cancer. She presented with a right upper lobe lung mass, large right anterior mediastinal and supraclavicular lymphadenopathy as well as a pancreatic andsplenic metastasis. She was diagnosed in September 2017.  The patient underwent systemic chemotherapy with carboplatin and etoposide. She is status post 6 cycles. She tolerated treatment well except for chemotherapy-induced anemia which required pRBCs and platelet transfusions. She had a significant improvement of her disease with chemotherapy.The patientthenunderwent prophylactic cranial irradiation. She then underwent stereotactic radiotherapy to the right upper lobe and pulmonary nodule.  She had been on observation for 2 years before showing evidence of disease progression.   She recently hadbeen started on systemic chemotherapy with carboplatin, etoposide, and Tecentriq. Starting from cycle #5, she has been on maintenance single agent Tecentriq. She has been tolerating treatment well.She is status post40cycles of single agent Tecentriq.  The patient recently had a restaging CT scan of the chest, abdomen, and pelvis.  Dr. Earlie Server personally and independently reviewed the scan discussed the results with patient today.  The scan did not show any evidence for disease progression.  Recommend that she proceed on the same treatment at the same dose.  She will proceed with cycle #41 today scheduled.  We will see her back for follow-up visit in 3 weeks for evaluation before starting cycle #42.  She will continue to take atarax if needed for itching.    We will send a small supply of oxycodone. We discussed only to reserve this for significant back pain that is not relieved by tylenol. The patient understands.   The patient was advised to call immediately if she has  any concerning symptoms in the interval. The patient voices understanding of current disease status and treatment options and is in agreement with the current care plan. All questions were answered. The patient knows to call the clinic with any problems, questions or concerns. We can certainly see the patient much sooner if necessary    No orders of the defined types were placed in this encounter.    I spent 20-29 minutes in this encounter.   Reyn Faivre L Launi Asencio, PA-C 11/10/20  ADDENDUM: Hematology/Oncology Attending: I had a face-to-face encounter with the patient today.  I reviewed her record, scan and recommended her care plan.  This is a very pleasant 60 years old white female with recurrent and metastatic small cell lung cancer that was initially diagnosed in September 2017 and presented with right upper lobe lung mass, large right anterior mediastinal and supraclavicular lymphadenopathy as well as pancreatic and splenic metastasis.  The patient had good initial response to the systemic chemotherapy at that time but she had evidence for disease recurrence in October 2019.  She was started  on systemic chemotherapy again with carboplatin, etoposide and Tecentriq with partial response.  She is currently on maintenance treatment with Tecentriq 1200 mg IV every 3 weeks status post total of 40 cycles of treatment including the induction phase. The patient has been tolerating this treatment well except for itching.  She also complain of chronic back pain secondary to several falls in the past.  She is requesting refill of her pain medication. She had repeat CT scan of the chest, abdomen pelvis performed recently.  I personally and independently reviewed the scan images and discussed the result with the patient today. Her scan showed no concerning findings for disease progression. I recommended for the patient to continue her current treatment with immunotherapy with Tecentriq and she will  proceed with cycle #41 today. We will give the patient a refill of Percocet for the back pain. She will come back for follow-up visit in 3 weeks for evaluation before the next cycle of her treatment. The patient was advised to call immediately if she has any concerning symptoms in the interval. The total time spent in the appointment was 35 minutes. Disclaimer: This note was dictated with voice recognition software. Similar sounding words can inadvertently be transcribed and may be missed upon review. Eilleen Kempf, MD 11/15/20

## 2020-11-13 ENCOUNTER — Other Ambulatory Visit: Payer: Self-pay

## 2020-11-13 ENCOUNTER — Ambulatory Visit (HOSPITAL_COMMUNITY)
Admission: RE | Admit: 2020-11-13 | Discharge: 2020-11-13 | Disposition: A | Discharge status: Home / Self Care | Payer: Medicare Other | Source: Ambulatory Visit | Attending: Internal Medicine | Admitting: Internal Medicine

## 2020-11-13 DIAGNOSIS — C3490 Malignant neoplasm of unspecified part of unspecified bronchus or lung: Secondary | ICD-10-CM | POA: Insufficient documentation

## 2020-11-13 MED ORDER — IOHEXOL 300 MG/ML  SOLN
100.0000 mL | Freq: Once | INTRAMUSCULAR | Status: AC | PRN
  Administered 2020-11-13: 100 mL via INTRAVENOUS

## 2020-11-13 MED ORDER — HEPARIN SOD (PORK) LOCK FLUSH 100 UNIT/ML IV SOLN: 500.0000 [IU] | Freq: Once | INTRAVENOUS | Status: AC

## 2020-11-13 MED ORDER — HEPARIN SOD (PORK) LOCK FLUSH 100 UNIT/ML IV SOLN
INTRAVENOUS | Status: AC
  Administered 2020-11-13: 500 [IU] via INTRAVENOUS
  Filled 2020-11-13: qty 5

## 2020-11-15 ENCOUNTER — Inpatient Hospital Stay (HOSPITAL_BASED_OUTPATIENT_CLINIC_OR_DEPARTMENT_OTHER): Payer: Medicare Other | Admitting: Physician Assistant

## 2020-11-15 ENCOUNTER — Inpatient Hospital Stay: Payer: Medicare Other | Attending: Internal Medicine

## 2020-11-15 ENCOUNTER — Inpatient Hospital Stay: Payer: Medicare Other

## 2020-11-15 ENCOUNTER — Other Ambulatory Visit: Payer: Self-pay

## 2020-11-15 VITALS — BP 118/88 | HR 80 | Temp 97.9°F | Resp 15 | Ht 63.0 in | Wt 122.3 lb

## 2020-11-15 DIAGNOSIS — C3491 Malignant neoplasm of unspecified part of right bronchus or lung: Secondary | ICD-10-CM | POA: Diagnosis not present

## 2020-11-15 DIAGNOSIS — C3411 Malignant neoplasm of upper lobe, right bronchus or lung: Secondary | ICD-10-CM | POA: Diagnosis not present

## 2020-11-15 DIAGNOSIS — Z85828 Personal history of other malignant neoplasm of skin: Secondary | ICD-10-CM | POA: Diagnosis not present

## 2020-11-15 DIAGNOSIS — C7889 Secondary malignant neoplasm of other digestive organs: Secondary | ICD-10-CM | POA: Diagnosis not present

## 2020-11-15 DIAGNOSIS — E785 Hyperlipidemia, unspecified: Secondary | ICD-10-CM | POA: Insufficient documentation

## 2020-11-15 DIAGNOSIS — M810 Age-related osteoporosis without current pathological fracture: Secondary | ICD-10-CM | POA: Insufficient documentation

## 2020-11-15 DIAGNOSIS — Z5112 Encounter for antineoplastic immunotherapy: Secondary | ICD-10-CM | POA: Diagnosis not present

## 2020-11-15 DIAGNOSIS — M549 Dorsalgia, unspecified: Secondary | ICD-10-CM | POA: Insufficient documentation

## 2020-11-15 DIAGNOSIS — R5383 Other fatigue: Secondary | ICD-10-CM | POA: Diagnosis not present

## 2020-11-15 DIAGNOSIS — G894 Chronic pain syndrome: Secondary | ICD-10-CM | POA: Insufficient documentation

## 2020-11-15 DIAGNOSIS — Z923 Personal history of irradiation: Secondary | ICD-10-CM | POA: Insufficient documentation

## 2020-11-15 DIAGNOSIS — I10 Essential (primary) hypertension: Secondary | ICD-10-CM | POA: Insufficient documentation

## 2020-11-15 DIAGNOSIS — Z79899 Other long term (current) drug therapy: Secondary | ICD-10-CM | POA: Insufficient documentation

## 2020-11-15 DIAGNOSIS — R590 Localized enlarged lymph nodes: Secondary | ICD-10-CM | POA: Insufficient documentation

## 2020-11-15 LAB — CBC WITH DIFFERENTIAL (CANCER CENTER ONLY)
Abs Immature Granulocytes: 0.02 10*3/uL (ref 0.00–0.07)
Basophils Absolute: 0 10*3/uL (ref 0.0–0.1)
Basophils Relative: 1 %
Eosinophils Absolute: 0.2 10*3/uL (ref 0.0–0.5)
Eosinophils Relative: 3 %
HCT: 34.3 % — ABNORMAL LOW (ref 36.0–46.0)
Hemoglobin: 11.7 g/dL — ABNORMAL LOW (ref 12.0–15.0)
Immature Granulocytes: 0 %
Lymphocytes Relative: 24 %
Lymphs Abs: 1.9 10*3/uL (ref 0.7–4.0)
MCH: 36.6 pg — ABNORMAL HIGH (ref 26.0–34.0)
MCHC: 34.1 g/dL (ref 30.0–36.0)
MCV: 107.2 fL — ABNORMAL HIGH (ref 80.0–100.0)
Monocytes Absolute: 0.5 10*3/uL (ref 0.1–1.0)
Monocytes Relative: 6 %
Neutro Abs: 5.2 10*3/uL (ref 1.7–7.7)
Neutrophils Relative %: 66 %
Platelet Count: 268 10*3/uL (ref 150–400)
RBC: 3.2 MIL/uL — ABNORMAL LOW (ref 3.87–5.11)
RDW: 12.6 % (ref 11.5–15.5)
WBC Count: 7.9 10*3/uL (ref 4.0–10.5)
nRBC: 0 % (ref 0.0–0.2)

## 2020-11-15 LAB — CMP (CANCER CENTER ONLY)
ALT: 13 U/L (ref 0–44)
AST: 17 U/L (ref 15–41)
Albumin: 3.3 g/dL — ABNORMAL LOW (ref 3.5–5.0)
Alkaline Phosphatase: 113 U/L (ref 38–126)
Anion gap: 10 (ref 5–15)
BUN: 5 mg/dL — ABNORMAL LOW (ref 8–23)
CO2: 25 mmol/L (ref 22–32)
Calcium: 8.7 mg/dL — ABNORMAL LOW (ref 8.9–10.3)
Chloride: 105 mmol/L (ref 98–111)
Creatinine: 0.75 mg/dL (ref 0.44–1.00)
GFR, Estimated: >60 mL/min (ref >60)
Glucose, Bld: 100 mg/dL — ABNORMAL HIGH (ref 70–99)
Potassium: 3.3 mmol/L — ABNORMAL LOW (ref 3.5–5.1)
Sodium: 140 mmol/L (ref 135–145)
Total Bilirubin: 0.3 mg/dL (ref 0.3–1.2)
Total Protein: 6.4 g/dL — ABNORMAL LOW (ref 6.5–8.1)

## 2020-11-15 MED ORDER — SODIUM CHLORIDE 0.9 % IV SOLN
Freq: Once | INTRAVENOUS | Status: AC
  Filled 2020-11-15: qty 250

## 2020-11-15 MED ORDER — HEPARIN SOD (PORK) LOCK FLUSH 100 UNIT/ML IV SOLN
500.0000 [IU] | Freq: Once | INTRAVENOUS | Status: DC | PRN
  Filled 2020-11-15: qty 5

## 2020-11-15 MED ORDER — SODIUM CHLORIDE 0.9% FLUSH
10.0000 mL | INTRAVENOUS | Status: DC | PRN
  Administered 2020-11-15: 10 mL
  Filled 2020-11-15: qty 10

## 2020-11-15 MED ORDER — OXYCODONE-ACETAMINOPHEN 5-325 MG PO TABS: 1.0000 | ORAL_TABLET | Freq: Four times a day (QID) | ORAL | 0 refills | Status: DC | PRN

## 2020-11-15 MED ORDER — SODIUM CHLORIDE 0.9 % IV SOLN
1200.0000 mg | Freq: Once | INTRAVENOUS | Status: AC
  Administered 2020-11-15: 1200 mg via INTRAVENOUS
  Filled 2020-11-15: qty 20

## 2020-11-15 MED ORDER — SODIUM CHLORIDE 0.9% FLUSH
10.0000 mL | INTRAVENOUS | Status: DC | PRN
  Filled 2020-11-15: qty 10

## 2020-11-15 NOTE — Patient Instructions (Signed)
[facility] Discharge Instructions for Patients Receiving Chemotherapy  Today you received the following chemotherapy agents Atezolizumab (Tencentriq)  To help prevent nausea and vomiting after your treatment, we encourage you to take your nausea medication as directed.  If you develop nausea and vomiting that is not controlled by your nausea medication, call the clinic.   BELOW ARE SYMPTOMS THAT SHOULD BE REPORTED IMMEDIATELY:  *FEVER GREATER THAN 100.5 F  *CHILLS WITH OR WITHOUT FEVER  NAUSEA AND VOMITING THAT IS NOT CONTROLLED WITH YOUR NAUSEA MEDICATION  *UNUSUAL SHORTNESS OF BREATH  *UNUSUAL BRUISING OR BLEEDING  TENDERNESS IN MOUTH AND THROAT WITH OR WITHOUT PRESENCE OF ULCERS  *URINARY PROBLEMS  *BOWEL PROBLEMS  UNUSUAL RASH Items with * indicate a potential emergency and should be followed up as soon as possible.  Feel free to call the clinic should you have any questions or concerns. The clinic phone number is [phone].  Please show the [facility] at check-in to the Emergency Department and triage nurse.

## 2020-12-06 ENCOUNTER — Other Ambulatory Visit: Payer: Self-pay

## 2020-12-06 ENCOUNTER — Inpatient Hospital Stay (HOSPITAL_BASED_OUTPATIENT_CLINIC_OR_DEPARTMENT_OTHER): Payer: Medicare Other | Admitting: Internal Medicine

## 2020-12-06 ENCOUNTER — Inpatient Hospital Stay: Payer: Medicare Other | Attending: Internal Medicine

## 2020-12-06 ENCOUNTER — Inpatient Hospital Stay: Payer: Medicare Other

## 2020-12-06 VITALS — BP 100/78 | HR 88 | Temp 97.7°F | Resp 18 | Ht 63.0 in | Wt 121.1 lb

## 2020-12-06 DIAGNOSIS — C3491 Malignant neoplasm of unspecified part of right bronchus or lung: Secondary | ICD-10-CM

## 2020-12-06 DIAGNOSIS — Z9221 Personal history of antineoplastic chemotherapy: Secondary | ICD-10-CM | POA: Diagnosis not present

## 2020-12-06 DIAGNOSIS — I2510 Atherosclerotic heart disease of native coronary artery without angina pectoris: Secondary | ICD-10-CM | POA: Insufficient documentation

## 2020-12-06 DIAGNOSIS — I10 Essential (primary) hypertension: Secondary | ICD-10-CM | POA: Insufficient documentation

## 2020-12-06 DIAGNOSIS — Z79899 Other long term (current) drug therapy: Secondary | ICD-10-CM | POA: Diagnosis not present

## 2020-12-06 DIAGNOSIS — Z85828 Personal history of other malignant neoplasm of skin: Secondary | ICD-10-CM | POA: Insufficient documentation

## 2020-12-06 DIAGNOSIS — I700 Atherosclerosis of aorta: Secondary | ICD-10-CM | POA: Diagnosis not present

## 2020-12-06 DIAGNOSIS — C7889 Secondary malignant neoplasm of other digestive organs: Secondary | ICD-10-CM | POA: Insufficient documentation

## 2020-12-06 DIAGNOSIS — C3411 Malignant neoplasm of upper lobe, right bronchus or lung: Secondary | ICD-10-CM | POA: Insufficient documentation

## 2020-12-06 DIAGNOSIS — Z5112 Encounter for antineoplastic immunotherapy: Secondary | ICD-10-CM | POA: Insufficient documentation

## 2020-12-06 DIAGNOSIS — E785 Hyperlipidemia, unspecified: Secondary | ICD-10-CM | POA: Insufficient documentation

## 2020-12-06 DIAGNOSIS — M810 Age-related osteoporosis without current pathological fracture: Secondary | ICD-10-CM | POA: Insufficient documentation

## 2020-12-06 DIAGNOSIS — G893 Neoplasm related pain (acute) (chronic): Secondary | ICD-10-CM | POA: Insufficient documentation

## 2020-12-06 DIAGNOSIS — R5383 Other fatigue: Secondary | ICD-10-CM

## 2020-12-06 LAB — CBC WITH DIFFERENTIAL (CANCER CENTER ONLY)
Abs Immature Granulocytes: 0.04 10*3/uL (ref 0.00–0.07)
Basophils Absolute: 0 10*3/uL (ref 0.0–0.1)
Basophils Relative: 0 %
Eosinophils Absolute: 0.2 10*3/uL (ref 0.0–0.5)
Eosinophils Relative: 2 %
HCT: 34.7 % — ABNORMAL LOW (ref 36.0–46.0)
Hemoglobin: 12.1 g/dL (ref 12.0–15.0)
Immature Granulocytes: 0 %
Lymphocytes Relative: 24 %
Lymphs Abs: 2.4 10*3/uL (ref 0.7–4.0)
MCH: 36.9 pg — ABNORMAL HIGH (ref 26.0–34.0)
MCHC: 34.9 g/dL (ref 30.0–36.0)
MCV: 105.8 fL — ABNORMAL HIGH (ref 80.0–100.0)
Monocytes Absolute: 0.5 10*3/uL (ref 0.1–1.0)
Monocytes Relative: 5 %
Neutro Abs: 7 10*3/uL (ref 1.7–7.7)
Neutrophils Relative %: 69 %
Platelet Count: 277 10*3/uL (ref 150–400)
RBC: 3.28 MIL/uL — ABNORMAL LOW (ref 3.87–5.11)
RDW: 12.6 % (ref 11.5–15.5)
WBC Count: 10.2 10*3/uL (ref 4.0–10.5)
nRBC: 0 % (ref 0.0–0.2)

## 2020-12-06 LAB — CMP (CANCER CENTER ONLY)
ALT: 20 U/L (ref 0–44)
AST: 19 U/L (ref 15–41)
Albumin: 3.4 g/dL — ABNORMAL LOW (ref 3.5–5.0)
Alkaline Phosphatase: 129 U/L — ABNORMAL HIGH (ref 38–126)
Anion gap: 13 (ref 5–15)
BUN: <4 mg/dL — ABNORMAL LOW (ref 8–23)
CO2: 25 mmol/L (ref 22–32)
Calcium: 8.3 mg/dL — ABNORMAL LOW (ref 8.9–10.3)
Chloride: 102 mmol/L (ref 98–111)
Creatinine: 0.74 mg/dL (ref 0.44–1.00)
GFR, Estimated: >60 mL/min
Glucose, Bld: 105 mg/dL — ABNORMAL HIGH (ref 70–99)
Potassium: 3.4 mmol/L — ABNORMAL LOW (ref 3.5–5.1)
Sodium: 140 mmol/L (ref 135–145)
Total Bilirubin: 0.3 mg/dL (ref 0.3–1.2)
Total Protein: 6.5 g/dL (ref 6.5–8.1)

## 2020-12-06 LAB — TSH: TSH: 1.719 u[IU]/mL (ref 0.308–3.960)

## 2020-12-06 MED ORDER — SODIUM CHLORIDE 0.9% FLUSH
10.0000 mL | INTRAVENOUS | Status: DC | PRN
  Administered 2020-12-06: 10 mL
  Filled 2020-12-06: qty 10

## 2020-12-06 MED ORDER — SODIUM CHLORIDE 0.9 % IV SOLN
1200.0000 mg | Freq: Once | INTRAVENOUS | Status: AC
  Administered 2020-12-06: 1200 mg via INTRAVENOUS
  Filled 2020-12-06: qty 20

## 2020-12-06 MED ORDER — SODIUM CHLORIDE 0.9 % IV SOLN
Freq: Once | INTRAVENOUS | Status: AC
  Filled 2020-12-06: qty 250

## 2020-12-06 MED ORDER — HEPARIN SOD (PORK) LOCK FLUSH 100 UNIT/ML IV SOLN
500.0000 [IU] | Freq: Once | INTRAVENOUS | Status: AC | PRN
  Administered 2020-12-06: 500 [IU]
  Filled 2020-12-06: qty 5

## 2020-12-06 NOTE — Patient Instructions (Signed)
[facility] Discharge Instructions for Patients Receiving Chemotherapy  Today you received the following chemotherapy agents: atezolizumab.  To help prevent nausea and vomiting after your treatment, we encourage you to take your nausea medication as directed.   If you develop nausea and vomiting that is not controlled by your nausea medication, call the clinic.   BELOW ARE SYMPTOMS THAT SHOULD BE REPORTED IMMEDIATELY:  *FEVER GREATER THAN 100.5 F  *CHILLS WITH OR WITHOUT FEVER  NAUSEA AND VOMITING THAT IS NOT CONTROLLED WITH YOUR NAUSEA MEDICATION  *UNUSUAL SHORTNESS OF BREATH  *UNUSUAL BRUISING OR BLEEDING  TENDERNESS IN MOUTH AND THROAT WITH OR WITHOUT PRESENCE OF ULCERS  *URINARY PROBLEMS  *BOWEL PROBLEMS  UNUSUAL RASH Items with * indicate a potential emergency and should be followed up as soon as possible.  Feel free to call the clinic should you have any questions or concerns. The clinic phone number is [phone].  Please show the [facility] at check-in to the Emergency Department and triage nurse.

## 2020-12-06 NOTE — Progress Notes (Signed)
Debra Kidd Telephone:([phone]) [phone]   Fax:(336) [phone]  OFFICE PROGRESS NOTE  [name], MD [address] [place] [address]  DIAGNOSIS: Extensive stage (T2a, N3, [name], large right anterior mediastinal and supraclavicular lymphadenopathy as well as pancreatic and splenic metastasis diagnosed in September 2017.  The patient had disease progression in October 2019.  PRIOR THERAPY:  1) Systemic chemotherapy was carboplatin for AUC of 5 on day 1 and etoposide 100 MG/M2 on days 1, 2 and 3 with Neulasta support. Status post 6 cycles with significant response of her disease. 2) Prophylactic cranial irradiation under the care of Dr. Sondra Come on 12/02/2016. 3) stereotactic radiotherapy to the recurrent right upper lobe pulmonary nodule under the care of Dr. Sondra Come completed November 10, 2017. 4) Retreatment with systemic chemotherapy with carboplatin for AUC of 5 on day 1 and etoposide 100 mg/M2 on days 1, 2 and 3 as well as Tecentriq (Atezolizumab) 1200 mg IV every 3 weeks with Neulasta support.  First dose June 22, 2018 for disease recurrence.  Status post 5 cycles.  Starting from cycle #2 her dose of carboplatin will be reduced to AUC of 4 and etoposide 80 mg/M2 on days 1, 2 and 3 in addition to the regular dose of Tecentriq.  CURRENT THERAPY: Maintenance treatment with single agent Tecentriq 1200 mg IV every 3 weeks.  Status post 37 cycles.  INTERVAL HISTORY: Debra Kidd 60 y.o. female returns to the clinic today for follow-up visit.  The patient is feeling fine today with no concerning complaints except for fatigue and lack of appetite.  She also has some sleep disturbance by sleeping more during the daytime and less sleep at nighttime.  She denied having any current chest pain, shortness of breath, cough or hemoptysis.  She denied having any fever or chills.  She has no nausea, vomiting, diarrhea or  constipation.  She has no headache or visual changes.  She is here today for evaluation before starting cycle #42 of her total treatment.   MEDICAL HISTORY: Past Medical History:  Diagnosis Date  . 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 Blood type, Rh positive   . Cancer ([place])   . Chronic pain syndrome   . Depression 08/07/2016  . Encounter for antineoplastic chemotherapy 07/17/2016  . History of external beam radiation therapy 11/19/16-12/02/16   brain 25 Gy in 10 fractions  . Hyperlipidemia   . Hypertension   . Hypertension 08/07/2016  . Osteoporosis   . Small cell lung cancer ([place]) dx'd 05/2016    ALLERGIES:  is allergic to codeine, motrin [ibuprofen], thiazide-type diuretics, and vicodin [hydrocodone-acetaminophen].  MEDICATIONS:  Current Outpatient Medications  Medication Sig Dispense Refill  . clopidogrel (PLAVIX) 75 MG tablet Take 1 tablet (75 mg total) by mouth daily. 30 tablet 0  . diazepam (VALIUM) 5 MG tablet Take 5 mg by mouth 3 (three) times daily as needed for anxiety.   2  . hydrOXYzine (ATARAX/VISTARIL) 10 MG tablet Take 1 tablet (10 mg total) by mouth 3 (three) times daily as needed. 90 tablet 1  . lidocaine-prilocaine (EMLA) cream Apply 1 application topically as needed. 30 g 0  . oxyCODONE-acetaminophen (PERCOCET) 5-325 MG tablet Take 1 tablet by mouth every 6 (six) hours as needed for severe pain. 30 tablet 0  . potassium chloride 20 MEQ/15ML (10%) SOLN Take 15 mLs (20 mEq  total) by mouth 2 (two) times daily. 210 mL 0  . prochlorperazine (COMPAZINE) 10 MG tablet Take 1 tablet (10 mg total) by mouth every 6 (six) hours as needed for nausea or vomiting. 30 tablet 2  . traMADol (ULTRAM) 50 MG tablet Take 50 mg by mouth every 6 (six) hours as needed for moderate pain.      No current facility-administered medications for this visit.    SURGICAL HISTORY:  Past Surgical History:  Procedure Laterality Date  . ABDOMINAL HYSTERECTOMY  1981  . APPENDECTOMY      at age 1  . EYE SURGERY     left  . IR GENERIC HISTORICAL  06/03/2016   IR FLUORO GUIDE PORT INSERTION RIGHT 06/03/2016 [facility]  . IR GENERIC HISTORICAL  06/03/2016   IR US GUIDE VASC ACCESS RIGHT 06/03/2016 [facility]  . SKIN CANCER EXCISION     basal cell carcinoma L side of face    REVIEW OF SYSTEMS:  A comprehensive review of systems was negative except for: Constitutional: positive for anorexia and fatigue Integument/breast: positive for dryness and pruritus Musculoskeletal: positive for muscle weakness Behavioral/Psych: positive for sleep disturbance   PHYSICAL EXAMINATION: General appearance: alert, cooperative and no distress Head: Normocephalic, without obvious abnormality, atraumatic Neck: no adenopathy, no JVD, supple, symmetrical, trachea midline and thyroid not enlarged, symmetric, no tenderness/mass/nodules Lymph nodes: Cervical, supraclavicular, and axillary nodes normal. Resp: clear to auscultation bilaterally Back: symmetric, no curvature. ROM normal. No CVA tenderness. Cardio: regular rate and rhythm, S1, S2 normal, no murmur, click, rub or gallop GI: soft, non-tender; bowel sounds normal; no masses,  no organomegaly Extremities: extremities normal, atraumatic, no cyanosis or edema  ECOG PERFORMANCE STATUS: 1 - Symptomatic but completely ambulatory  Blood pressure 100/78, pulse 88, temperature 97.7 F (36.5 C), temperature source Tympanic, resp. rate 18, height 5\' 3"  (1.6 m), weight 121 lb 1.6 oz (54.9 kg), SpO2 100 %.  LABORATORY DATA: Lab Results  Component Value Date   WBC 10.2 12/06/2020   HGB 12.1 12/06/2020   HCT 34.7 (L) 12/06/2020   MCV 105.8 (H) 12/06/2020   PLT 277 12/06/2020      Chemistry      Component Value Date/Time   NA 140 11/15/2020 0958   NA 141 08/01/2017 0835   K 3.3 (L) 11/15/2020 0958   K 3.5 08/01/2017 0835   CL 105 11/15/2020 0958   CO2 25 11/15/2020 0958   CO2 25 08/01/2017 0835   BUN 5 (L) 11/15/2020 0958   BUN  8.0 08/01/2017 0835   CREATININE 0.75 11/15/2020 0958   CREATININE 0.9 08/01/2017 0835      Component Value Date/Time   CALCIUM 8.7 (L) 11/15/2020 0958   CALCIUM 9.9 08/01/2017 0835   ALKPHOS 113 11/15/2020 0958   ALKPHOS 142 08/01/2017 0835   AST 17 11/15/2020 0958   AST 15 08/01/2017 0835   ALT 13 11/15/2020 0958   ALT 19 08/01/2017 0835   BILITOT 0.3 11/15/2020 0958   BILITOT 0.35 08/01/2017 0835       RADIOGRAPHIC STUDIES: CT Chest W Contrast  Result Date: 11/13/2020 CLINICAL DATA:  Metastatic small-cell lung cancer restaging EXAM: CT CHEST, ABDOMEN, AND PELVIS WITH CONTRAST TECHNIQUE: Multidetector CT imaging of the chest, abdomen and pelvis was performed following the standard protocol during bolus administration of intravenous contrast. CONTRAST:  160mL OMNIPAQUE IOHEXOL 300 MG/ML SOLN, additional oral enteric contrast COMPARISON:  09/07/2020, 05/29/2020 FINDINGS: CT CHEST FINDINGS Cardiovascular: Right chest port catheter. Aortic atherosclerosis. Normal  heart size. Left and right coronary artery calcifications. No pericardial effusion. Mediastinum/Nodes: No enlarged mediastinal, hilar, or axillary lymph nodes. Thyroid gland, trachea, and esophagus demonstrate no significant findings. Lungs/Pleura: Unchanged post treatment appearance of consolidation and fibrosis of the right pulmonary apex (series 6, image 31). Bandlike scarring of the dependent bilateral lung bases. No pleural effusion or pneumothorax. Musculoskeletal: No chest wall mass or suspicious bone lesions identified. Unchanged wedge deformities of T6 through T8 (series 6, and image 103). CT ABDOMEN PELVIS FINDINGS Hepatobiliary: No solid liver abnormality is seen. No gallstones, gallbladder wall thickening, or biliary dilatation. Pancreas: Unremarkable. No pancreatic ductal dilatation or surrounding inflammatory changes. Spleen: Normal in size without significant abnormality. Adrenals/Urinary Tract: Adrenal glands are  unremarkable. Kidneys are normal, without renal calculi, solid lesion, or hydronephrosis. Bladder is unremarkable. Stomach/Bowel: Stomach is within normal limits. Appendix is not clearly visualized and may be surgically absent. No evidence of bowel wall thickening, distention, or inflammatory changes. Vascular/Lymphatic: Aortic atherosclerosis. No enlarged abdominal or pelvic lymph nodes. Reproductive: Status post hysterectomy. Other: No abdominal wall hernia or abnormality. No abdominopelvic ascites. Musculoskeletal: No acute or significant osseous findings. IMPRESSION: 1. Unchanged post treatment appearance of consolidation and fibrosis of the right pulmonary apex. 2. No evidence of recurrent or metastatic disease in the chest, abdomen, or pelvis. 3. Coronary artery disease. Aortic Atherosclerosis (ICD10-I70.0). Electronically Signed   By: Eddie Candle M.D.   On: 11/13/2020 13:45   CT Abdomen Pelvis W Contrast  Result Date: 11/13/2020 CLINICAL DATA:  Metastatic small-cell lung cancer restaging EXAM: CT CHEST, ABDOMEN, AND PELVIS WITH CONTRAST TECHNIQUE: Multidetector CT imaging of the chest, abdomen and pelvis was performed following the standard protocol during bolus administration of intravenous contrast. CONTRAST:  156mL OMNIPAQUE IOHEXOL 300 MG/ML SOLN, additional oral enteric contrast COMPARISON:  09/07/2020, 05/29/2020 FINDINGS: CT CHEST FINDINGS Cardiovascular: Right chest port catheter. Aortic atherosclerosis. Normal heart size. Left and right coronary artery calcifications. No pericardial effusion. Mediastinum/Nodes: No enlarged mediastinal, hilar, or axillary lymph nodes. Thyroid gland, trachea, and esophagus demonstrate no significant findings. Lungs/Pleura: Unchanged post treatment appearance of consolidation and fibrosis of the right pulmonary apex (series 6, image 31). Bandlike scarring of the dependent bilateral lung bases. No pleural effusion or pneumothorax. Musculoskeletal: No chest wall mass  or suspicious bone lesions identified. Unchanged wedge deformities of T6 through T8 (series 6, and image 103). CT ABDOMEN PELVIS FINDINGS Hepatobiliary: No solid liver abnormality is seen. No gallstones, gallbladder wall thickening, or biliary dilatation. Pancreas: Unremarkable. No pancreatic ductal dilatation or surrounding inflammatory changes. Spleen: Normal in size without significant abnormality. Adrenals/Urinary Tract: Adrenal glands are unremarkable. Kidneys are normal, without renal calculi, solid lesion, or hydronephrosis. Bladder is unremarkable. Stomach/Bowel: Stomach is within normal limits. Appendix is not clearly visualized and may be surgically absent. No evidence of bowel wall thickening, distention, or inflammatory changes. Vascular/Lymphatic: Aortic atherosclerosis. No enlarged abdominal or pelvic lymph nodes. Reproductive: Status post hysterectomy. Other: No abdominal wall hernia or abnormality. No abdominopelvic ascites. Musculoskeletal: No acute or significant osseous findings. IMPRESSION: 1. Unchanged post treatment appearance of consolidation and fibrosis of the right pulmonary apex. 2. No evidence of recurrent or metastatic disease in the chest, abdomen, or pelvis. 3. Coronary artery disease. Aortic Atherosclerosis (ICD10-I70.0). Electronically Signed   By: Eddie Candle M.D.   On: 11/13/2020 13:45    ASSESSMENT AND PLAN:  This is a very pleasant 60 years old white female with extensive stage small cell lung cancer status post systemic chemotherapy with carbo platinum and etoposide  for 6 cycles and the patient rotated her treatment well except for chemotherapy-induced anemia and requirement for PRBCs and platelet transfusion. She had significant improvement in her disease with the chemotherapy. She also had prophylactic cranial irradiation. She also underwent stereotactic radiotherapy to progressive right upper lobe pulmonary nodule. The patient has been in observation for close to 2  years. Repeat CT scan of the chest, abdomen and pelvis performed recently showed evidence for disease progression in the abdomen. The patient was started on systemic chemotherapy again with carboplatin, etoposide and Tecentriq status post 41 cycles.  Starting from cycle #5 the patient is treated with maintenance single agent Tecentriq. The patient has been tolerating this treatment well with no concerning adverse effect except for the pruritus. I recommended for her to proceed with cycle #42 today as planned. I will see her back for follow-up visit in 3 weeks for evaluation before the next cycle of her treatment. The patient was advised to call immediately if she has any concerning symptoms in the interval. The patient voices understanding of current disease status and treatment options and is in agreement with the current care plan. All questions were answered. The patient knows to call the clinic with any problems, questions or concerns. We can certainly see the patient much sooner if necessary.  Disclaimer: This note was dictated with voice recognition software. Similar sounding words can inadvertently be transcribed and may not be corrected upon review.

## 2020-12-06 NOTE — Patient Instructions (Signed)
Implanted Port Insertion, Care After This sheet gives you information about how to care for yourself after your procedure. Your health care provider may also give you more specific instructions. If you have problems or questions, contact your health care provider. What can I expect after the procedure? After the procedure, it is common to have:  Discomfort at the port insertion site.  Bruising on the skin over the port. This should improve over 3-4 days. Follow these instructions at home: Port care  After your port is placed, you will get a manufacturer's information card. The card has information about your port. Keep this card with you at all times.  Take care of the port as told by your health care provider. Ask your health care provider if you or a family member can get training for taking care of the port at home. A home health care nurse may also take care of the port.  Make sure to remember what type of port you have. Incision care  Follow instructions from your health care provider about how to take care of your port insertion site. Make sure you: ? Wash your hands with soap and water before and after you change your bandage (dressing). If soap and water are not available, use hand sanitizer. ? Change your dressing as told by your health care provider. ? Leave stitches (sutures), skin glue, or adhesive strips in place. These skin closures may need to stay in place for 2 weeks or longer. If adhesive strip edges start to loosen and curl up, you may trim the loose edges. Do not remove adhesive strips completely unless your health care provider tells you to do that.  Check your port insertion site every day for signs of infection. Check for: ? Redness, swelling, or pain. ? Fluid or blood. ? Warmth. ? Pus or a bad smell.      Activity  Return to your normal activities as told by your health care provider. Ask your health care provider what activities are safe for you.  Do not  lift anything that is heavier than 10 lb (4.5 kg), or the limit that you are told, until your health care provider says that it is safe. General instructions  Take over-the-counter and prescription medicines only as told by your health care provider.  Do not take baths, swim, or use a hot tub until your health care provider approves. Ask your health care provider if you may take showers. You may only be allowed to take sponge baths.  Do not drive for 24 hours if you were given a sedative during your procedure.  Wear a medical alert bracelet in case of an emergency. This will tell any health care providers that you have a port.  Keep all follow-up visits as told by your health care provider. This is important. Contact a health care provider if:  You cannot flush your port with saline as directed, or you cannot draw blood from the port.  You have a fever or chills.  You have redness, swelling, or pain around your port insertion site.  You have fluid or blood coming from your port insertion site.  Your port insertion site feels warm to the touch.  You have pus or a bad smell coming from the port insertion site. Get help right away if:  You have chest pain or shortness of breath.  You have bleeding from your port that you cannot control. Summary  Take care of the port as told by your   health care provider. Keep the manufacturer's information card with you at all times.  Change your dressing as told by your health care provider.  Contact a health care provider if you have a fever or chills or if you have redness, swelling, or pain around your port insertion site.  Keep all follow-up visits as told by your health care provider. This information is not intended to replace advice given to you by your health care provider. Make sure you discuss any questions you have with your health care provider. Document Revised: 03/17/2018 Document Reviewed: 03/17/2018 Elsevier Patient Education   2021 [employer] Inc.  

## 2020-12-08 ENCOUNTER — Telehealth: Payer: Self-pay | Admitting: Internal Medicine

## 2020-12-08 NOTE — Telephone Encounter (Signed)
Scheduled per los. Called and left msg. Mailed printout  °

## 2020-12-26 ENCOUNTER — Other Ambulatory Visit: Payer: Self-pay

## 2020-12-26 ENCOUNTER — Ambulatory Visit (INDEPENDENT_AMBULATORY_CARE_PROVIDER_SITE_OTHER): Payer: Medicare Other | Admitting: Podiatry

## 2020-12-26 DIAGNOSIS — M79675 Pain in left toe(s): Secondary | ICD-10-CM | POA: Diagnosis not present

## 2020-12-26 DIAGNOSIS — M79674 Pain in right toe(s): Secondary | ICD-10-CM | POA: Diagnosis not present

## 2020-12-26 DIAGNOSIS — B351 Tinea unguium: Secondary | ICD-10-CM | POA: Diagnosis not present

## 2020-12-26 DIAGNOSIS — D689 Coagulation defect, unspecified: Secondary | ICD-10-CM | POA: Diagnosis not present

## 2020-12-26 NOTE — Progress Notes (Signed)
  Subjective:  Patient ID: Debra Kidd, female    DOB: 18-Aug-1961,  MRN: [id number]  Chief Complaint  Patient presents with  . Nail Problem    Patient reports painful elongated toenails that are discolored and thick. Patient is currently receiving chemo therapy and that provider believes that the treatment maybe causing the some of the problem with the toenails. Toenails are getting stuck on socks, sheets and hurt inside of closed toed shoes.    60 y.o. female presents with the above complaint. History confirmed with patient. Endorses hx of stroke.  Objective:  Physical Exam: warm, good capillary refill, no trophic changes or ulcerative lesions, normal DP and PT pulses and normal sensory exam. Nails thickened and dystrophic. Left Foot: normal exam, no swelling, tenderness, instability; ligaments intact, full range of motion of all ankle/foot joints  Right Foot: normal exam, no swelling, tenderness, instability; ligaments intact, full range of motion of all ankle/foot joints   No images are attached to the encounter. Assessment:   1. Pain due to onychomycosis of toenails of both feet   2. Blood clotting disorder ([place])      Plan:  Patient was evaluated and treated and all questions answered.  Onychomycosis -Educated on etiology -Palliative debridement of nails.  Procedure: Nail Debridement Type of Debridement: manual, sharp debridement. Instrumentation: Nail nipper, rotary burr. Number of Nails: 10  Return if symptoms worsen or fail to improve.

## 2020-12-26 NOTE — Patient Instructions (Signed)
For the nails you can get 40 or 50% urea cream or gel. Available on [employer].

## 2020-12-27 ENCOUNTER — Inpatient Hospital Stay: Payer: Medicare Other

## 2020-12-27 ENCOUNTER — Encounter: Payer: Self-pay | Admitting: Internal Medicine

## 2020-12-27 ENCOUNTER — Inpatient Hospital Stay (HOSPITAL_BASED_OUTPATIENT_CLINIC_OR_DEPARTMENT_OTHER): Payer: Medicare Other | Admitting: Internal Medicine

## 2020-12-27 ENCOUNTER — Other Ambulatory Visit: Payer: Self-pay | Admitting: Physician Assistant

## 2020-12-27 VITALS — BP 111/88 | HR 85 | Temp 97.2°F | Resp 18 | Ht 63.0 in | Wt 118.2 lb

## 2020-12-27 DIAGNOSIS — C3491 Malignant neoplasm of unspecified part of right bronchus or lung: Secondary | ICD-10-CM

## 2020-12-27 DIAGNOSIS — D6481 Anemia due to antineoplastic chemotherapy: Secondary | ICD-10-CM | POA: Diagnosis not present

## 2020-12-27 DIAGNOSIS — T451X5A Adverse effect of antineoplastic and immunosuppressive drugs, initial encounter: Secondary | ICD-10-CM | POA: Diagnosis not present

## 2020-12-27 DIAGNOSIS — E876 Hypokalemia: Secondary | ICD-10-CM

## 2020-12-27 DIAGNOSIS — Z5112 Encounter for antineoplastic immunotherapy: Secondary | ICD-10-CM

## 2020-12-27 DIAGNOSIS — R5383 Other fatigue: Secondary | ICD-10-CM

## 2020-12-27 LAB — CBC WITH DIFFERENTIAL (CANCER CENTER ONLY)
Abs Immature Granulocytes: 0.03 10*3/uL (ref 0.00–0.07)
Basophils Absolute: 0 10*3/uL (ref 0.0–0.1)
Basophils Relative: 0 %
Eosinophils Absolute: 0.2 10*3/uL (ref 0.0–0.5)
Eosinophils Relative: 2 %
HCT: 33.3 % — ABNORMAL LOW (ref 36.0–46.0)
Hemoglobin: 11.6 g/dL — ABNORMAL LOW (ref 12.0–15.0)
Immature Granulocytes: 0 %
Lymphocytes Relative: 21 %
Lymphs Abs: 1.8 10*3/uL (ref 0.7–4.0)
MCH: 36.9 pg — ABNORMAL HIGH (ref 26.0–34.0)
MCHC: 34.8 g/dL (ref 30.0–36.0)
MCV: 106.1 fL — ABNORMAL HIGH (ref 80.0–100.0)
Monocytes Absolute: 0.5 10*3/uL (ref 0.1–1.0)
Monocytes Relative: 5 %
Neutro Abs: 5.8 10*3/uL (ref 1.7–7.7)
Neutrophils Relative %: 72 %
Platelet Count: 273 10*3/uL (ref 150–400)
RBC: 3.14 MIL/uL — ABNORMAL LOW (ref 3.87–5.11)
RDW: 13.1 % (ref 11.5–15.5)
WBC Count: 8.3 10*3/uL (ref 4.0–10.5)
nRBC: 0 % (ref 0.0–0.2)

## 2020-12-27 LAB — CMP (CANCER CENTER ONLY)
ALT: 15 U/L (ref 0–44)
AST: 21 U/L (ref 15–41)
Albumin: 3.3 g/dL — ABNORMAL LOW (ref 3.5–5.0)
Alkaline Phosphatase: 116 U/L (ref 38–126)
Anion gap: 11 (ref 5–15)
BUN: 5 mg/dL — ABNORMAL LOW (ref 8–23)
CO2: 28 mmol/L (ref 22–32)
Calcium: 8.5 mg/dL — ABNORMAL LOW (ref 8.9–10.3)
Chloride: 101 mmol/L (ref 98–111)
Creatinine: 0.72 mg/dL (ref 0.44–1.00)
GFR, Estimated: >60 mL/min (ref >60)
Glucose, Bld: 102 mg/dL — ABNORMAL HIGH (ref 70–99)
Potassium: 2.9 mmol/L — ABNORMAL LOW (ref 3.5–5.1)
Sodium: 140 mmol/L (ref 135–145)
Total Bilirubin: 0.4 mg/dL (ref 0.3–1.2)
Total Protein: 6.4 g/dL — ABNORMAL LOW (ref 6.5–8.1)

## 2020-12-27 LAB — TSH: TSH: 1.609 u[IU]/mL (ref 0.308–3.960)

## 2020-12-27 MED ORDER — SODIUM CHLORIDE 0.9 % IV SOLN
1200.0000 mg | Freq: Once | INTRAVENOUS | Status: AC
  Administered 2020-12-27: 1200 mg via INTRAVENOUS
  Filled 2020-12-27: qty 20

## 2020-12-27 MED ORDER — SODIUM CHLORIDE 0.9 % IV SOLN
Freq: Once | INTRAVENOUS | Status: AC
  Filled 2020-12-27: qty 250

## 2020-12-27 MED ORDER — SODIUM CHLORIDE 0.9% FLUSH
10.0000 mL | INTRAVENOUS | Status: DC | PRN
  Administered 2020-12-27: 10 mL
  Filled 2020-12-27: qty 10

## 2020-12-27 MED ORDER — HEPARIN SOD (PORK) LOCK FLUSH 100 UNIT/ML IV SOLN
500.0000 [IU] | Freq: Once | INTRAVENOUS | Status: AC | PRN
Start: 2020-12-27 — End: 2020-12-27
  Administered 2020-12-27: 500 [IU]
  Filled 2020-12-27: qty 5

## 2020-12-27 MED ORDER — POTASSIUM CHLORIDE 20 MEQ/15ML (10%) PO SOLN: 20.0000 meq | Freq: Two times a day (BID) | ORAL | 0 refills | Status: DC

## 2020-12-27 MED ORDER — PROCHLORPERAZINE MALEATE 10 MG PO TABS: 10.0000 mg | ORAL_TABLET | Freq: Four times a day (QID) | ORAL | 2 refills | Status: DC | PRN

## 2020-12-27 NOTE — Patient Instructions (Signed)
[facility]  Discharge Instructions: Thank you for choosing [facility] to provide your oncology and hematology care.   If you have a lab appointment with the [facility], please go directly to the [facility] and check in at the registration area.   Wear comfortable clothing and clothing appropriate for easy access to any Portacath or PICC line.   We strive to give you quality time with your provider. You may need to reschedule your appointment if you arrive late (15 or more minutes).  Arriving late affects you and other patients whose appointments are after yours.  Also, if you miss three or more appointments without notifying the office, you may be dismissed from the clinic at the provider's discretion.      For prescription refill requests, have your pharmacy contact our office and allow 72 hours for refills to be completed.    Today you received the following chemotherapy and/or immunotherapy agents tecentriq.   To help prevent nausea and vomiting after your treatment, we encourage you to take your nausea medication as directed.  BELOW ARE SYMPTOMS THAT SHOULD BE REPORTED IMMEDIATELY: . *FEVER GREATER THAN 100.4 F (38 C) OR HIGHER . *CHILLS OR SWEATING . *NAUSEA AND VOMITING THAT IS NOT CONTROLLED WITH YOUR NAUSEA MEDICATION . *UNUSUAL SHORTNESS OF BREATH . *UNUSUAL BRUISING OR BLEEDING . *URINARY PROBLEMS (pain or burning when urinating, or frequent urination) . *BOWEL PROBLEMS (unusual diarrhea, constipation, pain near the anus) . TENDERNESS IN MOUTH AND THROAT WITH OR WITHOUT PRESENCE OF ULCERS (sore throat, sores in mouth, or a toothache) . UNUSUAL RASH, SWELLING OR PAIN  . UNUSUAL VAGINAL DISCHARGE OR ITCHING   Items with * indicate a potential emergency and should be followed up as soon as possible or go to the Emergency Department if any problems should occur.  Please show the CHEMOTHERAPY ALERT CARD or IMMUNOTHERAPY ALERT  CARD at check-in to the Emergency Department and triage nurse.  Should you have questions after your visit or need to cancel or reschedule your appointment, please contact [facility]  Dept: [phone]  and follow the prompts.  Office hours are 8:00 a.m. to 4:30 p.m. Monday - Friday. Please note that voicemails left after 4:00 p.m. may not be returned until the following business day.  We are closed weekends and major holidays. You have access to a nurse at all times for urgent questions. Please call the main number to the clinic Dept: [phone] and follow the prompts.   For any non-urgent questions, you may also contact your provider using MyChart. We now offer e-Visits for anyone 1 and older to request care online for non-urgent symptoms. For details visit [email or web address].   Also download the MyChart app! Go to the app store, search "MyChart", open the app, select [facility], and log in with your MyChart username and password.  Due to Covid, a mask is required upon entering the hospital/clinic. If you do not have a mask, one will be given to you upon arrival. For doctor visits, patients may have 1 support person aged 7 or older with them. For treatment visits, patients cannot have anyone with them due to current Covid guidelines and our immunocompromised population.

## 2020-12-27 NOTE — Progress Notes (Signed)
[facility] [phone]   [phone]  OFFICE PROGRESS NOTE  [name], MD [address] [place] [address]  DIAGNOSIS: Extensive stage (T2a, N3, [name], large right anterior mediastinal and supraclavicular lymphadenopathy as well as pancreatic and splenic metastasis diagnosed in September 2017.  The patient had disease progression in October 2019.  PRIOR THERAPY:  1) Systemic chemotherapy was carboplatin for AUC of 5 on day 1 and etoposide 100 MG/M2 on days 1, 2 and 3 with Neulasta support. Status post 6 cycles with significant response of her disease. 2) Prophylactic cranial irradiation under the care of Dr. Sondra Come on 12/02/2016. 3) stereotactic radiotherapy to the recurrent right upper lobe pulmonary nodule under the care of Dr. Sondra Come completed November 10, 2017. 4) Retreatment with systemic chemotherapy with carboplatin for AUC of 5 on day 1 and etoposide 100 mg/M2 on days 1, 2 and 3 as well as Tecentriq (Atezolizumab) 1200 mg IV every 3 weeks with Neulasta support.  First dose June 22, 2018 for disease recurrence.  Status post 5 cycles.  Starting from cycle #2 her dose of carboplatin will be reduced to AUC of 4 and etoposide 80 mg/M2 on days 1, 2 and 3 in addition to the regular dose of Tecentriq.  CURRENT THERAPY: Maintenance treatment with single agent Tecentriq 1200 mg IV every 3 weeks.  Status post 38 cycles.  INTERVAL HISTORY: Debra Kidd 60 y.o. female returns to the clinic today for follow-up visit.  The patient is feeling fine today with no concerning complaints except for occasional dizzy spells when she changes position.  She denied having any current chest pain, shortness of breath except with exertion with no cough or hemoptysis.  She denied having any fever or chills.  She has no nausea, vomiting, diarrhea or constipation.  She has no headache or visual changes.  She is  here today for evaluation before starting cycle #39 of her maintenance treatment with Tecentriq.  MEDICAL HISTORY: Past Medical History:  Diagnosis Date  . 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 Blood type, Rh positive   . Cancer ([facility])   . Chronic pain syndrome   . Depression 08/07/2016  . Encounter for antineoplastic chemotherapy 07/17/2016  . History of external beam radiation therapy 11/19/16-12/02/16   brain 25 Gy in 10 fractions  . Hyperlipidemia   . Hypertension   . Hypertension 08/07/2016  . Osteoporosis   . Small cell lung cancer ([facility]) dx'd 05/2016    ALLERGIES:  is allergic to codeine, motrin [ibuprofen], thiazide-type diuretics, and vicodin [hydrocodone-acetaminophen].  MEDICATIONS:  Current Outpatient Medications  Medication Sig Dispense Refill  . clopidogrel (PLAVIX) 75 MG tablet Take 1 tablet (75 mg total) by mouth daily. 30 tablet 0  . diazepam (VALIUM) 5 MG tablet Take 5 mg by mouth 3 (three) times daily as needed for anxiety.   2  . hydrOXYzine (ATARAX/VISTARIL) 10 MG tablet Take 1 tablet (10 mg total) by mouth 3 (three) times daily as needed. 90 tablet 1  . lidocaine-prilocaine (EMLA) cream Apply 1 application topically as needed. 30 g 0  . oxyCODONE-acetaminophen (PERCOCET) 5-325 MG tablet Take 1 tablet by mouth every 6 (six) hours as needed for severe pain. 30 tablet 0  . potassium chloride 20 MEQ/15ML (10%) SOLN Take 15 mLs (20 mEq total) by mouth 2 (two) times daily. 210 mL 0  .  prochlorperazine (COMPAZINE) 10 MG tablet Take 1 tablet (10 mg total) by mouth every 6 (six) hours as needed for nausea or vomiting. 30 tablet 2  . traMADol (ULTRAM) 50 MG tablet Take 50 mg by mouth every 6 (six) hours as needed for moderate pain.      No current facility-administered medications for this visit.    SURGICAL HISTORY:  Past Surgical History:  Procedure Laterality Date  . ABDOMINAL HYSTERECTOMY  1981  . APPENDECTOMY     at age 29  . EYE SURGERY     left  .  IR GENERIC HISTORICAL  06/03/2016   IR FLUORO GUIDE PORT INSERTION RIGHT 06/03/2016 [facility]  . IR GENERIC HISTORICAL  06/03/2016   IR US GUIDE VASC ACCESS RIGHT 06/03/2016 [facility]  . SKIN CANCER EXCISION     basal cell carcinoma L side of face    REVIEW OF SYSTEMS:  A comprehensive review of systems was negative except for: Constitutional: positive for anorexia and fatigue Musculoskeletal: positive for muscle weakness Neurological: positive for dizziness   PHYSICAL EXAMINATION: General appearance: alert, cooperative and no distress Head: Normocephalic, without obvious abnormality, atraumatic Neck: no adenopathy, no JVD, supple, symmetrical, trachea midline and thyroid not enlarged, symmetric, no tenderness/mass/nodules Lymph nodes: Cervical, supraclavicular, and axillary nodes normal. Resp: clear to auscultation bilaterally Back: symmetric, no curvature. ROM normal. No CVA tenderness. Cardio: regular rate and rhythm, S1, S2 normal, no murmur, click, rub or gallop GI: soft, non-tender; bowel sounds normal; no masses,  no organomegaly Extremities: extremities normal, atraumatic, no cyanosis or edema  ECOG PERFORMANCE STATUS: 1 - Symptomatic but completely ambulatory  Blood pressure 111/88, pulse 85, temperature (!) 97.2 F (36.2 C), temperature source Tympanic, resp. rate 18, height 5\' 3"  (1.6 m), weight 118 lb 3.2 oz (53.6 kg), SpO2 100 %.  LABORATORY DATA: Lab Results  Component Value Date   WBC 8.3 12/27/2020   HGB 11.6 (L) 12/27/2020   HCT 33.3 (L) 12/27/2020   MCV 106.1 (H) 12/27/2020   PLT 273 12/27/2020      Chemistry      Component Value Date/Time   NA 140 12/06/2020 1058   NA 141 08/01/2017 0835   K 3.4 (L) 12/06/2020 1058   K 3.5 08/01/2017 0835   CL 102 12/06/2020 1058   CO2 25 12/06/2020 1058   CO2 25 08/01/2017 0835   BUN <4 (L) 12/06/2020 1058   BUN 8.0 08/01/2017 0835   CREATININE 0.74 12/06/2020 1058   CREATININE 0.9 08/01/2017 0835       Component Value Date/Time   CALCIUM 8.3 (L) 12/06/2020 1058   CALCIUM 9.9 08/01/2017 0835   ALKPHOS 129 (H) 12/06/2020 1058   ALKPHOS 142 08/01/2017 0835   AST 19 12/06/2020 1058   AST 15 08/01/2017 0835   ALT 20 12/06/2020 1058   ALT 19 08/01/2017 0835   BILITOT 0.3 12/06/2020 1058   BILITOT 0.35 08/01/2017 0835       RADIOGRAPHIC STUDIES: No results found.  ASSESSMENT AND PLAN:  This is a very pleasant 60 years old white female with extensive stage small cell lung cancer status post systemic chemotherapy with carbo platinum and etoposide for 6 cycles and the patient rotated her treatment well except for chemotherapy-induced anemia and requirement for PRBCs and platelet transfusion. She had significant improvement in her disease with the chemotherapy. She also had prophylactic cranial irradiation. She also underwent stereotactic radiotherapy to progressive right upper lobe pulmonary nodule. The patient has been in observation for  close to 2 years. Repeat CT scan of the chest, abdomen and pelvis performed recently showed evidence for disease progression in the abdomen. The patient was started on systemic chemotherapy again with carboplatin, etoposide and Tecentriq status post 42 cycles.  Starting from cycle #5 the patient is treated with maintenance single agent Tecentriq. The patient continues to tolerate this treatment well with no concerning adverse effects. I recommended for her to proceed with cycle #43 today as planned. I will see her back for follow-up visit in 3 weeks for evaluation before the next cycle of her treatment. She was advised to call immediately if she has any other concerning symptoms in the interval. The patient voices understanding of current disease status and treatment options and is in agreement with the current care plan. All questions were answered. The patient knows to call the clinic with any problems, questions or concerns. We can certainly see the  patient much sooner if necessary.  Disclaimer: This note was dictated with voice recognition software. Similar sounding words can inadvertently be transcribed and may not be corrected upon review.

## 2021-01-17 ENCOUNTER — Encounter: Payer: Self-pay | Admitting: Internal Medicine

## 2021-01-17 ENCOUNTER — Inpatient Hospital Stay: Payer: Medicare Other

## 2021-01-17 ENCOUNTER — Inpatient Hospital Stay: Payer: Medicare Other | Attending: Internal Medicine | Admitting: Internal Medicine

## 2021-01-17 ENCOUNTER — Encounter: Payer: Self-pay | Admitting: *Deleted

## 2021-01-17 ENCOUNTER — Other Ambulatory Visit: Payer: Medicare Other

## 2021-01-17 ENCOUNTER — Other Ambulatory Visit: Payer: Self-pay

## 2021-01-17 VITALS — BP 95/73 | HR 97 | Temp 97.0°F | Resp 18 | Ht 63.0 in | Wt 116.0 lb

## 2021-01-17 DIAGNOSIS — C3490 Malignant neoplasm of unspecified part of unspecified bronchus or lung: Secondary | ICD-10-CM

## 2021-01-17 DIAGNOSIS — C3491 Malignant neoplasm of unspecified part of right bronchus or lung: Secondary | ICD-10-CM

## 2021-01-17 DIAGNOSIS — M810 Age-related osteoporosis without current pathological fracture: Secondary | ICD-10-CM | POA: Diagnosis not present

## 2021-01-17 DIAGNOSIS — Z5112 Encounter for antineoplastic immunotherapy: Secondary | ICD-10-CM | POA: Diagnosis present

## 2021-01-17 DIAGNOSIS — M545 Low back pain, unspecified: Secondary | ICD-10-CM | POA: Insufficient documentation

## 2021-01-17 DIAGNOSIS — R5383 Other fatigue: Secondary | ICD-10-CM | POA: Diagnosis not present

## 2021-01-17 DIAGNOSIS — C3411 Malignant neoplasm of upper lobe, right bronchus or lung: Secondary | ICD-10-CM | POA: Insufficient documentation

## 2021-01-17 DIAGNOSIS — Z5111 Encounter for antineoplastic chemotherapy: Secondary | ICD-10-CM | POA: Insufficient documentation

## 2021-01-17 DIAGNOSIS — C7889 Secondary malignant neoplasm of other digestive organs: Secondary | ICD-10-CM | POA: Diagnosis not present

## 2021-01-17 DIAGNOSIS — E785 Hyperlipidemia, unspecified: Secondary | ICD-10-CM | POA: Diagnosis not present

## 2021-01-17 LAB — CMP (CANCER CENTER ONLY)
ALT: 21 U/L (ref 0–44)
AST: 27 U/L (ref 15–41)
Albumin: 3.1 g/dL — ABNORMAL LOW (ref 3.5–5.0)
Alkaline Phosphatase: 140 U/L — ABNORMAL HIGH (ref 38–126)
Anion gap: 11 (ref 5–15)
BUN: 5 mg/dL — ABNORMAL LOW (ref 8–23)
CO2: 26 mmol/L (ref 22–32)
Calcium: 9.2 mg/dL (ref 8.9–10.3)
Chloride: 103 mmol/L (ref 98–111)
Creatinine: 0.76 mg/dL (ref 0.44–1.00)
GFR, Estimated: >60 mL/min (ref >60)
Glucose, Bld: 130 mg/dL — ABNORMAL HIGH (ref 70–99)
Potassium: 3.3 mmol/L — ABNORMAL LOW (ref 3.5–5.1)
Sodium: 140 mmol/L (ref 135–145)
Total Bilirubin: 0.4 mg/dL (ref 0.3–1.2)
Total Protein: 6.9 g/dL (ref 6.5–8.1)

## 2021-01-17 LAB — CBC WITH DIFFERENTIAL (CANCER CENTER ONLY)
Abs Immature Granulocytes: 0.05 10*3/uL (ref 0.00–0.07)
Basophils Absolute: 0.1 10*3/uL (ref 0.0–0.1)
Basophils Relative: 1 %
Eosinophils Absolute: 1.1 10*3/uL — ABNORMAL HIGH (ref 0.0–0.5)
Eosinophils Relative: 12 %
HCT: 32.1 % — ABNORMAL LOW (ref 36.0–46.0)
Hemoglobin: 11.2 g/dL — ABNORMAL LOW (ref 12.0–15.0)
Immature Granulocytes: 1 %
Lymphocytes Relative: 18 %
Lymphs Abs: 1.7 10*3/uL (ref 0.7–4.0)
MCH: 37.2 pg — ABNORMAL HIGH (ref 26.0–34.0)
MCHC: 34.9 g/dL (ref 30.0–36.0)
MCV: 106.6 fL — ABNORMAL HIGH (ref 80.0–100.0)
Monocytes Absolute: 0.5 10*3/uL (ref 0.1–1.0)
Monocytes Relative: 6 %
Neutro Abs: 5.8 10*3/uL (ref 1.7–7.7)
Neutrophils Relative %: 62 %
Platelet Count: 310 10*3/uL (ref 150–400)
RBC: 3.01 MIL/uL — ABNORMAL LOW (ref 3.87–5.11)
RDW: 13.3 % (ref 11.5–15.5)
WBC Count: 9.2 10*3/uL (ref 4.0–10.5)
nRBC: 0 % (ref 0.0–0.2)

## 2021-01-17 LAB — TSH: TSH: 1.13 u[IU]/mL (ref 0.308–3.960)

## 2021-01-17 MED ORDER — SODIUM CHLORIDE 0.9% FLUSH
10.0000 mL | INTRAVENOUS | Status: DC | PRN
Start: 2021-01-17 — End: 2021-01-17
  Administered 2021-01-17: 10 mL
  Filled 2021-01-17: qty 10

## 2021-01-17 MED ORDER — OXYCODONE-ACETAMINOPHEN 5-325 MG PO TABS: 1.0000 | ORAL_TABLET | Freq: Four times a day (QID) | ORAL | 0 refills | Status: DC | PRN

## 2021-01-17 MED ORDER — SODIUM CHLORIDE 0.9 % IV SOLN
1200.0000 mg | Freq: Once | INTRAVENOUS | Status: AC
  Administered 2021-01-17: 1200 mg via INTRAVENOUS
  Filled 2021-01-17: qty 20

## 2021-01-17 MED ORDER — HEPARIN SOD (PORK) LOCK FLUSH 100 UNIT/ML IV SOLN
500.0000 [IU] | Freq: Once | INTRAVENOUS | Status: AC | PRN
  Administered 2021-01-17: 500 [IU]
  Filled 2021-01-17: qty 5

## 2021-01-17 MED ORDER — SODIUM CHLORIDE 0.9 % IV SOLN
Freq: Once | INTRAVENOUS | Status: AC
  Filled 2021-01-17: qty 250

## 2021-01-17 MED ORDER — SODIUM CHLORIDE 0.9% FLUSH
10.0000 mL | INTRAVENOUS | Status: DC | PRN
  Administered 2021-01-17: 10 mL
  Filled 2021-01-17: qty 10

## 2021-01-17 NOTE — Patient Instructions (Signed)
[facility]  Discharge Instructions: Thank you for choosing [facility] to provide your oncology and hematology care.   If you have a lab appointment with the [facility], please go directly to the [facility] and check in at the registration area.   Wear comfortable clothing and clothing appropriate for easy access to any Portacath or PICC line.   We strive to give you quality time with your provider. You may need to reschedule your appointment if you arrive late (15 or more minutes).  Arriving late affects you and other patients whose appointments are after yours.  Also, if you miss three or more appointments without notifying the office, you may be dismissed from the clinic at the provider's discretion.      For prescription refill requests, have your pharmacy contact our office and allow 72 hours for refills to be completed.    Today you received the following chemotherapy and/or immunotherapy agents tecentriq.   To help prevent nausea and vomiting after your treatment, we encourage you to take your nausea medication as directed.  BELOW ARE SYMPTOMS THAT SHOULD BE REPORTED IMMEDIATELY: . *FEVER GREATER THAN 100.4 F (38 C) OR HIGHER . *CHILLS OR SWEATING . *NAUSEA AND VOMITING THAT IS NOT CONTROLLED WITH YOUR NAUSEA MEDICATION . *UNUSUAL SHORTNESS OF BREATH . *UNUSUAL BRUISING OR BLEEDING . *URINARY PROBLEMS (pain or burning when urinating, or frequent urination) . *BOWEL PROBLEMS (unusual diarrhea, constipation, pain near the anus) . TENDERNESS IN MOUTH AND THROAT WITH OR WITHOUT PRESENCE OF ULCERS (sore throat, sores in mouth, or a toothache) . UNUSUAL RASH, SWELLING OR PAIN  . UNUSUAL VAGINAL DISCHARGE OR ITCHING   Items with * indicate a potential emergency and should be followed up as soon as possible or go to the Emergency Department if any problems should occur.  Please show the CHEMOTHERAPY ALERT CARD or IMMUNOTHERAPY ALERT  CARD at check-in to the Emergency Department and triage nurse.  Should you have questions after your visit or need to cancel or reschedule your appointment, please contact [facility]  Dept: [phone]  and follow the prompts.  Office hours are 8:00 a.m. to 4:30 p.m. Monday - Friday. Please note that voicemails left after 4:00 p.m. may not be returned until the following business day.  We are closed weekends and major holidays. You have access to a nurse at all times for urgent questions. Please call the main number to the clinic Dept: [phone] and follow the prompts.   For any non-urgent questions, you may also contact your provider using MyChart. We now offer e-Visits for anyone 40 and older to request care online for non-urgent symptoms. For details visit [email or web address].   Also download the MyChart app! Go to the app store, search "MyChart", open the app, select [facility], and log in with your MyChart username and password.  Due to Covid, a mask is required upon entering the hospital/clinic. If you do not have a mask, one will be given to you upon arrival. For doctor visits, patients may have 1 support person aged 64 or older with them. For treatment visits, patients cannot have anyone with them due to current Covid guidelines and our immunocompromised population.

## 2021-01-17 NOTE — Patient Instructions (Signed)
Steps to Quit Smoking Smoking tobacco is the leading cause of preventable death. It can affect almost every organ in the body. Smoking puts you and people around you at risk for many serious, long-lasting (chronic) diseases. Quitting smoking can be hard, but it is one of the best things that you can do for your health. It is never too late to quit. How do I get ready to quit? When you decide to quit smoking, make a plan to help you succeed. Before you quit:  Pick a date to quit. Set a date within the next 2 weeks to give you time to prepare.  Write down the reasons why you are quitting. Keep this list in places where you will see it often.  Tell your family, friends, and co-workers that you are quitting. Their support is important.  Talk with your doctor about the choices that may help you quit.  Find out if your health insurance will pay for these treatments.  Know the people, places, things, and activities that make you want to smoke (triggers). Avoid them. What first steps can I take to quit smoking?  Throw away all cigarettes at home, at work, and in your car.  Throw away the things that you use when you smoke, such as ashtrays and lighters.  Clean your car. Make sure to empty the ashtray.  Clean your home, including curtains and carpets. What can I do to help me quit smoking? Talk with your doctor about taking medicines and seeing a counselor at the same time. You are more likely to succeed when you do both.  If you are pregnant or breastfeeding, talk with your doctor about counseling or other ways to quit smoking. Do not take medicine to help you quit smoking unless your doctor tells you to do so. To quit smoking: Quit right away  Quit smoking totally, instead of slowly cutting back on how much you smoke over a period of time.  Go to counseling. You are more likely to quit if you go to counseling sessions regularly. Take medicine You may take medicines to help you quit. Some  medicines need a prescription, and some you can buy over-the-counter. Some medicines may contain a drug called nicotine to replace the nicotine in cigarettes. Medicines may:  Help you to stop having the desire to smoke (cravings).  Help to stop the problems that come when you stop smoking (withdrawal symptoms). Your doctor may ask you to use:  Nicotine patches, gum, or lozenges.  Nicotine inhalers or sprays.  Non-nicotine medicine that is taken by mouth. Find resources Find resources and other ways to help you quit smoking and remain smoke-free after you quit. These resources are most helpful when you use them often. They include:  Online chats with a counselor.  Phone quitlines.  Printed self-help materials.  Support groups or group counseling.  Text messaging programs.  Mobile phone apps. Use apps on your mobile phone or tablet that can help you stick to your quit plan. There are many free apps for mobile phones and tablets as well as websites. Examples include Quit Guide from the CDC and smokefree.gov   What things can I do to make it easier to quit?  Talk to your family and friends. Ask them to support and encourage you.  Call a phone quitline (1-800-QUIT-NOW), reach out to support groups, or work with a counselor.  Ask people who smoke to not smoke around you.  Avoid places that make you want to smoke,   such as: ? Bars. ? Parties. ? Smoke-break areas at work.  Spend time with people who do not smoke.  Lower the stress in your life. Stress can make you want to smoke. Try these things to help your stress: ? Getting regular exercise. ? Doing deep-breathing exercises. ? Doing yoga. ? Meditating. ? Doing a body scan. To do this, close your eyes, focus on one area of your body at a time from head to toe. Notice which parts of your body are tense. Try to relax the muscles in those areas.   How will I feel when I quit smoking? Day 1 to 3 weeks Within the first 24 hours,  you may start to have some problems that come from quitting tobacco. These problems are very bad 2-3 days after you quit, but they do not often last for more than 2-3 weeks. You may get these symptoms:  Mood swings.  Feeling restless, nervous, angry, or annoyed.  Trouble concentrating.  Dizziness.  Strong desire for high-sugar foods and nicotine.  Weight gain.  Trouble pooping (constipation).  Feeling like you may vomit (nausea).  Coughing or a sore throat.  Changes in how the medicines that you take for other issues work in your body.  Depression.  Trouble sleeping (insomnia). Week 3 and afterward After the first 2-3 weeks of quitting, you may start to notice more positive results, such as:  Better sense of smell and taste.  Less coughing and sore throat.  Slower heart rate.  Lower blood pressure.  Clearer skin.  Better breathing.  Fewer sick days. Quitting smoking can be hard. Do not give up if you fail the first time. Some people need to try a few times before they succeed. Do your best to stick to your quit plan, and talk with your doctor if you have any questions or concerns. Summary  Smoking tobacco is the leading cause of preventable death. Quitting smoking can be hard, but it is one of the best things that you can do for your health.  When you decide to quit smoking, make a plan to help you succeed.  Quit smoking right away, not slowly over a period of time.  When you start quitting, seek help from your doctor, family, or friends. This information is not intended to replace advice given to you by your health care provider. Make sure you discuss any questions you have with your health care provider. Document Revised: 05/14/2019 Document Reviewed: 11/07/2018 Elsevier Patient Education  2021 [employer]  

## 2021-01-17 NOTE — Progress Notes (Signed)
[place] [phone]   Fax:([phone]) [phone]  OFFICE PROGRESS NOTE  [name], MD [address] [place] [address]  DIAGNOSIS: Extensive stage (T2a, N3, [name], large right anterior mediastinal and supraclavicular lymphadenopathy as well as pancreatic and splenic metastasis diagnosed in September 2017.  The patient had disease progression in October 2019.  PRIOR THERAPY:  1) Systemic chemotherapy was carboplatin for AUC of 5 on day 1 and etoposide 100 MG/M2 on days 1, 2 and 3 with Neulasta support. Status post 6 cycles with significant response of her disease. 2) Prophylactic cranial irradiation under the care of Dr. Sondra Come on 12/02/2016. 3) stereotactic radiotherapy to the recurrent right upper lobe pulmonary nodule under the care of Dr. Sondra Come completed November 10, 2017. 4) Retreatment with systemic chemotherapy with carboplatin for AUC of 5 on day 1 and etoposide 100 mg/M2 on days 1, 2 and 3 as well as Tecentriq (Atezolizumab) 1200 mg IV every 3 weeks with Neulasta support.  First dose June 22, 2018 for disease recurrence.  Status post 5 cycles.  Starting from cycle #2 her dose of carboplatin will be reduced to AUC of 4 and etoposide 80 mg/M2 on days 1, 2 and 3 in addition to the regular dose of Tecentriq.  CURRENT THERAPY: Maintenance treatment with single agent Tecentriq 1200 mg IV every 3 weeks.  Status post 39 cycles.  INTERVAL HISTORY: Debra Kidd 60 y.o. female returns to the clinic for follow-up visit.  The patient is feeling fine today with no concerning complaints except for the persistent low back pain and she is requesting refill of Percocet.  She denied having any current chest pain, shortness of breath, cough or hemoptysis.  She denied having any nausea, vomiting, diarrhea or constipation.  She has no headache or visual changes.  She has no weight loss or night sweats.  She is  here today for evaluation before starting cycle #44 of her treatment.  MEDICAL HISTORY: Past Medical History:  Diagnosis Date  . 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 Blood type, Rh positive   . Cancer ([place])   . Chronic pain syndrome   . Depression 08/07/2016  . Encounter for antineoplastic chemotherapy 07/17/2016  . History of external beam radiation therapy 11/19/16-12/02/16   brain 25 Gy in 10 fractions  . Hyperlipidemia   . Hypertension   . Hypertension 08/07/2016  . Osteoporosis   . Small cell lung cancer ([place]) dx'd 05/2016    ALLERGIES:  is allergic to codeine, motrin [ibuprofen], thiazide-type diuretics, and vicodin [hydrocodone-acetaminophen].  MEDICATIONS:  Current Outpatient Medications  Medication Sig Dispense Refill  . clopidogrel (PLAVIX) 75 MG tablet Take 1 tablet (75 mg total) by mouth daily. 30 tablet 0  . diazepam (VALIUM) 5 MG tablet Take 5 mg by mouth 3 (three) times daily as needed for anxiety.   2  . hydrOXYzine (ATARAX/VISTARIL) 10 MG tablet Take 1 tablet (10 mg total) by mouth 3 (three) times daily as needed. 90 tablet 1  . lidocaine-prilocaine (EMLA) cream Apply 1 application topically as needed. 30 g 0  . oxyCODONE-acetaminophen (PERCOCET) 5-325 MG tablet Take 1 tablet by mouth every 6 (six) hours as needed for severe pain. 30 tablet 0  . potassium chloride 20 MEQ/15ML (10%) SOLN Take 15 mLs (20 mEq total) by mouth 2 (two) times daily. 210 mL 0  .  prochlorperazine (COMPAZINE) 10 MG tablet Take 1 tablet (10 mg total) by mouth every 6 (six) hours as needed for nausea or vomiting. 30 tablet 2  . traMADol (ULTRAM) 50 MG tablet Take 50 mg by mouth every 6 (six) hours as needed for moderate pain.      No current facility-administered medications for this visit.    SURGICAL HISTORY:  Past Surgical History:  Procedure Laterality Date  . ABDOMINAL HYSTERECTOMY  1981  . APPENDECTOMY     at age 75  . EYE SURGERY     left  . IR GENERIC HISTORICAL   06/03/2016   IR FLUORO GUIDE PORT INSERTION RIGHT 06/03/2016 [facility]  . IR GENERIC HISTORICAL  06/03/2016   IR US GUIDE VASC ACCESS RIGHT 06/03/2016 [facility]  . SKIN CANCER EXCISION     basal cell carcinoma L side of face    REVIEW OF SYSTEMS:  A comprehensive review of systems was negative except for: Constitutional: positive for anorexia and fatigue Musculoskeletal: positive for back pain   PHYSICAL EXAMINATION: General appearance: alert, cooperative and no distress Head: Normocephalic, without obvious abnormality, atraumatic Neck: no adenopathy, no JVD, supple, symmetrical, trachea midline and thyroid not enlarged, symmetric, no tenderness/mass/nodules Lymph nodes: Cervical, supraclavicular, and axillary nodes normal. Resp: clear to auscultation bilaterally Back: symmetric, no curvature. ROM normal. No CVA tenderness. Cardio: regular rate and rhythm, S1, S2 normal, no murmur, click, rub or gallop GI: soft, non-tender; bowel sounds normal; no masses,  no organomegaly Extremities: extremities normal, atraumatic, no cyanosis or edema  ECOG PERFORMANCE STATUS: 1 - Symptomatic but completely ambulatory  Blood pressure 95/73, pulse 97, temperature (!) 97 F (36.1 C), temperature source Tympanic, resp. rate 18, height 5\' 3"  (1.6 m), weight 116 lb (52.6 kg), SpO2 99 %.  LABORATORY DATA: Lab Results  Component Value Date   WBC 9.2 01/17/2021   HGB 11.2 (L) 01/17/2021   HCT 32.1 (L) 01/17/2021   MCV 106.6 (H) 01/17/2021   PLT 310 01/17/2021      Chemistry      Component Value Date/Time   NA 140 12/27/2020 0934   NA 141 08/01/2017 0835   K 2.9 (L) 12/27/2020 0934   K 3.5 08/01/2017 0835   CL 101 12/27/2020 0934   CO2 28 12/27/2020 0934   CO2 25 08/01/2017 0835   BUN 5 (L) 12/27/2020 0934   BUN 8.0 08/01/2017 0835   CREATININE 0.72 12/27/2020 0934   CREATININE 0.9 08/01/2017 0835      Component Value Date/Time   CALCIUM 8.5 (L) 12/27/2020 0934   CALCIUM 9.9  08/01/2017 0835   ALKPHOS 116 12/27/2020 0934   ALKPHOS 142 08/01/2017 0835   AST 21 12/27/2020 0934   AST 15 08/01/2017 0835   ALT 15 12/27/2020 0934   ALT 19 08/01/2017 0835   BILITOT 0.4 12/27/2020 0934   BILITOT 0.35 08/01/2017 0835       RADIOGRAPHIC STUDIES: No results found.  ASSESSMENT AND PLAN:  This is a very pleasant 60 years old white female with extensive stage small cell lung cancer status post systemic chemotherapy with carbo platinum and etoposide for 6 cycles and the patient rotated her treatment well except for chemotherapy-induced anemia and requirement for PRBCs and platelet transfusion. She had significant improvement in her disease with the chemotherapy. She also had prophylactic cranial irradiation. She also underwent stereotactic radiotherapy to progressive right upper lobe pulmonary nodule. The patient has been in observation for close to 2 years. Repeat CT scan  of the chest, abdomen and pelvis performed recently showed evidence for disease progression in the abdomen. The patient was started on systemic chemotherapy again with carboplatin, etoposide and Tecentriq status post 43 cycles.  Starting from cycle #5 the patient is treated with maintenance single agent Tecentriq. The patient continues to tolerate her treatment well with no concerning adverse effect except for mild fatigue. I recommended for her to proceed with cycle #44 today as planned. I will see her back for follow-up visit in 3 weeks for evaluation with repeat CT scan of the chest, abdomen pelvis for restaging of her disease. For the pain management, I will give her a refill of Percocet today. The patient was advised to call immediately if she has any other concerning symptoms in the interval. The patient voices understanding of current disease status and treatment options and is in agreement with the current care plan. All questions were answered. The patient knows to call the clinic with any  problems, questions or concerns. We can certainly see the patient much sooner if necessary.  Disclaimer: This note was dictated with voice recognition software. Similar sounding words can inadvertently be transcribed and may not be corrected upon review.

## 2021-01-17 NOTE — Progress Notes (Signed)
Spoke with Debra Kidd today.  She has extensive stage small cell lung cancer since 2017.  She understands her plan of care.

## 2021-01-18 ENCOUNTER — Telehealth: Payer: Self-pay | Admitting: Internal Medicine

## 2021-01-18 NOTE — Telephone Encounter (Signed)
Scheduled per los. Called, not able to leave msg. Mailed printout  

## 2021-02-02 NOTE — Progress Notes (Signed)
[facility] OFFICE PROGRESS NOTE  Debra Roers, MD [address] [address]  DIAGNOSIS: Extensive stage (T2a, N3, [name], large right anterior mediastinal and supraclavicular lymphadenopathy as well as pancreatic and splenic metastasis diagnosed in September 2017.  The patient had disease progression in October 2019.  PRIOR THERAPY: 1) Systemic chemotherapy was carboplatin for AUC of 5 on day 1 and etoposide 100 MG/M2 on days 1, 2 and 3 with Neulasta support. Status post 6 cycles with significant response of her disease. 2) Prophylactic cranial irradiation under the care of Dr. Sondra Come on 12/02/2016. 3) stereotactic radiotherapy to the recurrent right upper lobe pulmonary nodule under the care of Dr. Sondra Come completed November 10, 2017. 4) Retreatment with systemic chemotherapy with carboplatin for AUC of 5 on day 1 and etoposide 100 mg/M2 on days 1, 2 and 3 as well as Tecentriq (Atezolizumab) 1200 mg IV every 3 weeks with Neulasta support.  First dose June 22, 2018 for disease recurrence.  Status post 5 cycles.  Starting from cycle #2 her dose of carboplatin will be reduced to AUC of 4 and etoposide 80 mg/M2 on days 1, 2 and 3 in addition to the regular dose of Tecentriq.  CURRENT THERAPY:  Maintenance treatment with single agent Tecentriq 1200 mg IV every 3 weeks.  Status post 44 cycles.  INTERVAL HISTORY: Debra Kidd 60 y.o. female returns to the clinic today for a follow up visit. The patient is feeling well overall today with a new complaint of intermittent sternal chest pain lasting 2 weeks. She states it is worse after eating and improves with antiacids. The pain is non-radiating and she denies any diaphoresis. She continues to experience some nausea and reports a poor appetite although her weight is stable. She has continued intermittent headaches that she states are similar to the way they felt a few  months prior. She had a brain MRI performed at that time which did not show any metastatic disease to the brain. She has moderate fatigue and at times feels lightheaded upon standing. She reports minimal water intake and drinks mostly soft drinks. She denies any other concerning symptoms including fevers, chills, and night sweats. She also denies vomiting, diarrhea, and rashes. She has no change in her baseline cough or shortness of breath and denies hemoptysis. The patient continues to tolerate treatment with immunotherapy with Tecentriqwell without any adverse effects exceptoccasionalitching for which she uses atarax.Her itching has been better recentlythough. She uses carnation instant breakfast but does not drink boost or ensure.She frequently has hypokalemia on labs. No inhalers, diarrhea, or vomiting. She recently had a restaging CT scan of the chest, abdomen, and pelvis.The patient is here for evaluationand to review her scan resultsprior to starting cycle #45.    MEDICAL HISTORY: Past Medical History:  Diagnosis Date  . 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 Blood type, Rh positive   . Cancer ([facility])   . Chronic pain syndrome   . Depression 08/07/2016  . Encounter for antineoplastic chemotherapy 07/17/2016  . History of external beam radiation therapy 11/19/16-12/02/16   brain 25 Gy in 10 fractions  . Hyperlipidemia   . Hypertension   . Hypertension 08/07/2016  . Osteoporosis   . Small cell lung cancer ([name]) dx'd 05/2016    ALLERGIES:  is allergic to codeine, motrin [ibuprofen], thiazide-type diuretics, and vicodin [hydrocodone-acetaminophen].  MEDICATIONS:  Current  Outpatient Medications  Medication Sig Dispense Refill  . clopidogrel (PLAVIX) 75 MG tablet Take 1 tablet (75 mg total) by mouth daily. 30 tablet 0  . diazepam (VALIUM) 5 MG tablet Take 5 mg by mouth 3 (three) times daily as needed for anxiety.   2  . hydrOXYzine (ATARAX/VISTARIL) 10 MG tablet Take 1  tablet (10 mg total) by mouth 3 (three) times daily as needed. 90 tablet 1  . lidocaine-prilocaine (EMLA) cream Apply 1 application topically as needed. 30 g 0  . oxyCODONE-acetaminophen (PERCOCET) 5-325 MG tablet Take 1 tablet by mouth every 6 (six) hours as needed for severe pain. 30 tablet 0  . potassium chloride 20 MEQ/15ML (10%) SOLN Take 15 mLs (20 mEq total) by mouth 2 (two) times daily. 210 mL 0  . prochlorperazine (COMPAZINE) 10 MG tablet Take 1 tablet (10 mg total) by mouth every 6 (six) hours as needed for nausea or vomiting. 30 tablet 2  . traMADol (ULTRAM) 50 MG tablet Take 50 mg by mouth 4 (four) times daily as needed for moderate pain (confirmed with her Pharmacy-Dr K. Little 03/15 she received #120).     No current facility-administered medications for this visit.    SURGICAL HISTORY:  Past Surgical History:  Procedure Laterality Date  . ABDOMINAL HYSTERECTOMY  1981  . APPENDECTOMY     at age 55  . EYE SURGERY     left  . IR GENERIC HISTORICAL  06/03/2016   IR FLUORO GUIDE PORT INSERTION RIGHT 06/03/2016 [facility]  . IR GENERIC HISTORICAL  06/03/2016   IR US GUIDE VASC ACCESS RIGHT 06/03/2016 [facility]  . SKIN CANCER EXCISION     basal cell carcinoma L side of face    REVIEW OF SYSTEMS:   Review of Systems  Constitutional: Positive for fatigue and decreased appetite. Negative for chills, fever and unexpected weight change.  HENT: Negative for mouth sores, nosebleeds, sore throat and trouble swallowing.  Eyes: Negative for eye problems and icterus.  Respiratory:Positive for baseline coughand shortness of breath with exertion.Negative for hemoptysis and wheezing.  Cardiovascular: Positive for sternal chest pain that improves with antacids. Negative for leg swelling.  Gastrointestinal:Positive for mild nausea.Negative for abdominal pain, constipation, and vomiting.  Genitourinary: Negative for bladder incontinence, difficulty urinating, dysuria, frequency and  hematuria.  Musculoskeletal:Positive for back pain. Negative for gait problem, neck pain and neck stiffness.  Skin:Positive for itching(improved from prior).Negative for rash.  Neurological:Lower extremity weakness, gradually worsening L>R due to prior stoke.Negative for dizziness, headaches, [name], depression and sleep disturbance. The patient is not nervous/anxious    PHYSICAL EXAMINATION:  Blood pressure 92/60, pulse 86, temperature 97.7 F (36.5 C), temperature source Tympanic, resp. rate 16, height 5\' 3"  (1.6 m), weight 115 lb 3.2 oz (52.3 kg), SpO2 100 %.  ECOG PERFORMANCE STATUS: 1 - Symptomatic but completely ambulatory  Physical Exam  Constitutional: Oriented to person, place, and time andchronically ill appearing femaleand in no distress.  HENT:  Head: Normocephalic and atraumatic.  Mouth/Throat: Oropharynx is clear and moist. No oropharyngeal exudate.  Eyes: Conjunctivae are normal. Right eye exhibits no discharge. Left eye exhibits no discharge. No scleral icterus.  Neck: Normal range of motion. Neck supple.  Cardiovascular: Normal rate, regular rhythm, normal heart sounds and intact distal pulses.  Pulmonary/Chest: Effort normal and breath sounds normal. No respiratory distress. No wheezes. No rales.  Abdominal: Soft. Bowel sounds are normal.  Exhibits no distension and no mass. There is no tenderness.  Musculoskeletal: Normal range of motion. Exhibits no edema.  Lymphadenopathy:  No cervical adenopathy.  Neurological: Alert and oriented to person, place, and time. Exhibits muscle wasting. Examined in the wheelchair.  Skin: Skin is warm and dry. No rash noted. Not diaphoretic. No erythema. No pallor.  Psychiatric: Mood, memory and judgment normal.  Vitals reviewed.  LABORATORY DATA: Lab Results  Component Value Date    WBC 7.4 02/07/2021   HGB 11.4 (L) 02/07/2021   HCT 32.5 (L) 02/07/2021   MCV 106.6 (H) 02/07/2021   PLT 271 02/07/2021      Chemistry      Component Value Date/Time   NA 139 02/07/2021 1120   NA 141 08/01/2017 0835   K 2.7 (LL) 02/07/2021 1120   K 3.5 08/01/2017 0835   CL 100 02/07/2021 1120   CO2 26 02/07/2021 1120   CO2 25 08/01/2017 0835   BUN 4 (L) 02/07/2021 1120   BUN 8.0 08/01/2017 0835   CREATININE 0.79 02/07/2021 1120   CREATININE 0.9 08/01/2017 0835      Component Value Date/Time   CALCIUM 8.6 (L) 02/07/2021 1120   CALCIUM 9.9 08/01/2017 0835   ALKPHOS 122 02/07/2021 1120   ALKPHOS 142 08/01/2017 0835   AST 22 02/07/2021 1120   AST 15 08/01/2017 0835   ALT 18 02/07/2021 1120   ALT 19 08/01/2017 0835   BILITOT 0.4 02/07/2021 1120   BILITOT 0.35 08/01/2017 0835       RADIOGRAPHIC STUDIES:  CT Chest W Contrast  Result Date: 02/05/2021 CLINICAL DATA:  60 year old female with small cell lung cancer diagnosed 2017. Chemotherapy. Radiation therapy complete. Immunotherapy in progress. EXAM: CT CHEST, ABDOMEN, AND PELVIS WITH CONTRAST TECHNIQUE: Multidetector CT imaging of the chest, abdomen and pelvis was performed following the standard protocol during bolus administration of intravenous contrast. CONTRAST:  13mL OMNIPAQUE IOHEXOL 300 MG/ML  SOLN COMPARISON:  11/13/2020 FINDINGS: CT CHEST FINDINGS Cardiovascular: Coronary artery calcification and aortic atherosclerotic calcification. Eccentric plaque along the transverse arch is unchanged from prior. Port in the anterior chest wall with tip in distal SVC. Mediastinum/Nodes: No axillary or supraclavicular adenopathy. No mediastinal or hilar adenopathy. No pericardial fluid. Esophagus normal. Lungs/Pleura: Band of pleuroparenchymal fibrosis at the lateral RIGHT lung apex is unchanged from prior and consistent postradiation change. No new or suspicious pulmonary nodules. Musculoskeletal: No aggressive osseous lesion. CT  ABDOMEN AND PELVIS FINDINGS Hepatobiliary: Low-attenuation along the falciform ligament is most consistent with focal fatty infiltration and not changed from prior. Pancreas: Pancreas is normal. No ductal dilatation. No pancreatic inflammation. Spleen: Normal spleen Adrenals/urinary tract: Adrenal glands and kidneys are normal. The ureters and bladder normal. Stomach/Bowel: Stomach, small bowel, appendix, and cecum are normal. The colon and rectosigmoid colon are normal. Vascular/Lymphatic: Abdominal aorta is normal caliber. There is no retroperitoneal or periportal lymphadenopathy. No pelvic lymphadenopathy. Reproductive: Post hysterectomy.  Adnexa unremarkable Other: No peritoneal metastasis Musculoskeletal: No aggressive osseous lesion. Chronic compression deformity of the T9 vertebral body. IMPRESSION: 1. No evidence small cell lung cancer recurrence or metastasis. 2. Stable post therapy change at the RIGHT lung apex. 3. No interval change from 11/13/2020. Electronically Signed   By: Suzy Bouchard M.D.   On: 02/05/2021 09:20   CT Abdomen Pelvis W Contrast  Result Date: 02/05/2021 CLINICAL DATA:  60 year old female with small cell lung cancer diagnosed 2017. Chemotherapy. Radiation therapy complete. Immunotherapy in progress. EXAM: CT CHEST, ABDOMEN, AND PELVIS WITH CONTRAST TECHNIQUE: Multidetector  CT imaging of the chest, abdomen and pelvis was performed following the standard protocol during bolus administration of intravenous contrast. CONTRAST:  16mL OMNIPAQUE IOHEXOL 300 MG/ML  SOLN COMPARISON:  11/13/2020 FINDINGS: CT CHEST FINDINGS Cardiovascular: Coronary artery calcification and aortic atherosclerotic calcification. Eccentric plaque along the transverse arch is unchanged from prior. Port in the anterior chest wall with tip in distal SVC. Mediastinum/Nodes: No axillary or supraclavicular adenopathy. No mediastinal or hilar adenopathy. No pericardial fluid. Esophagus normal. Lungs/Pleura: Band of  pleuroparenchymal fibrosis at the lateral RIGHT lung apex is unchanged from prior and consistent postradiation change. No new or suspicious pulmonary nodules. Musculoskeletal: No aggressive osseous lesion. CT ABDOMEN AND PELVIS FINDINGS Hepatobiliary: Low-attenuation along the falciform ligament is most consistent with focal fatty infiltration and not changed from prior. Pancreas: Pancreas is normal. No ductal dilatation. No pancreatic inflammation. Spleen: Normal spleen Adrenals/urinary tract: Adrenal glands and kidneys are normal. The ureters and bladder normal. Stomach/Bowel: Stomach, small bowel, appendix, and cecum are normal. The colon and rectosigmoid colon are normal. Vascular/Lymphatic: Abdominal aorta is normal caliber. There is no retroperitoneal or periportal lymphadenopathy. No pelvic lymphadenopathy. Reproductive: Post hysterectomy.  Adnexa unremarkable Other: No peritoneal metastasis Musculoskeletal: No aggressive osseous lesion. Chronic compression deformity of the T9 vertebral body. IMPRESSION: 1. No evidence small cell lung cancer recurrence or metastasis. 2. Stable post therapy change at the RIGHT lung apex. 3. No interval change from 11/13/2020. Electronically Signed   By: Suzy Bouchard M.D.   On: 02/05/2021 09:20     ASSESSMENT/PLAN:  This is a very pleasant 60 year old Caucasian female with extensive stage small cell lung cancer. She presented with a right upper lobe lung mass, large right anterior mediastinal and supraclavicular lymphadenopathy as well as a pancreatic andsplenic metastasis. She was diagnosed in September 2017.  The patient underwent systemic chemotherapy with carboplatin and etoposide. She is status post 6 cycles. She tolerated treatment well except for chemotherapy-induced anemia which required pRBCs and platelet transfusions. She had a significant improvement of her disease with chemotherapy.The patientthenunderwent prophylactic cranial irradiation. She  then underwent stereotactic radiotherapy to the right upper lobe and pulmonary nodule.  She had been on observation for 2 years before showing evidence of disease progression.   She recently hadbeen started on systemic chemotherapy with carboplatin, etoposide, and Tecentriq. Starting from cycle #5, she has been on maintenance single agent Tecentriq. She has been tolerating treatment well.She is status post44cycles of  Tecentriq total.  The patient recently had a restaging CT scan of the chest, abdomen, and pelvis. Dr. Earlie Server personally and independently reviewed the scan discussed the results with patient today. The scan showed no evidence for disease progression.   Recommend that she continue on the same treatment at the same dose. She will proceed with cycle #45 today scheduled.  We will see her back for follow-up visit in 3 weeks for evaluation before starting cycle #46.  She will continue to take atarax if needed for itching.   Her potassium is low today. We will check magnesium on her next lab draw. We will arrange for her to receive 30 meq of IV potassium chloride today as well as 1 L of fluid for the hypotension. I also gave her a prescription for 20 meq of potassium to take BID for 7 days to her pharmacy. She prefers the liquid formulation for potassium chloride. Discussed the importance of staying hydrated with water, Pedialyte, or Gatorade as opposed to soft drinks.   The patient was advised to call immediately  if she has any concerning symptoms in the interval. The patient voices understanding of current disease status and treatment options and is in agreement with the current care plan. All questions were answered. The patient knows to call the clinic with any problems, questions or concerns. We can certainly see the patient much sooner if necessary        Orders Placed This Encounter  Procedures  . Magnesium    Standing Status:   Future    Standing Expiration  Date:   02/07/2022      Tobe Sos Kiernan Farkas, PA-C 02/07/21  ADDENDUM: Hematology/Oncology Attending: I had a face-to-face encounter with the patient today.  I reviewed her records, lab and scan and recommended her care plan.  This is a very pleasant 60 years old white female initially diagnosed with extensive stage small cell lung cancer in September 2017 with recurrence in October 2019, status post induction systemic chemotherapy with carboplatin, etoposide and Tecentriq and she is currently on maintenance treatment with single agent Tecentriq status post total of 44 cycles.  The patient continues to tolerate her treatment well with no concerning adverse effects. She has intermittent headache and MRI of the brain in November for evaluation of similar complaints was unremarkable. She had repeat CT scan of the chest, abdomen pelvis performed recently.  I personally and independently reviewed the scan and discussed the results with the patient today. Her scan showed no concerning findings for disease progression. I recommended for the patient to continue her current maintenance treatment with single agent Tecentriq. She will come back for follow-up visit in 3 weeks for evaluation before the next cycle of her treatment. If the patient continues to have persistent headache, will consider her for repeat MRI of the brain again. She was advised to call immediately if she has any other concerning symptoms in the interval. The total time spent in the appointment was 30 minutes. Disclaimer: This note was dictated with voice recognition software. Similar sounding words can inadvertently be transcribed and may be missed upon review. Eilleen Kempf, MD 02/07/21

## 2021-02-05 ENCOUNTER — Other Ambulatory Visit: Payer: Self-pay

## 2021-02-05 ENCOUNTER — Ambulatory Visit (HOSPITAL_COMMUNITY)
Admission: RE | Admit: 2021-02-05 | Discharge: 2021-02-05 | Disposition: A | Discharge status: Home / Self Care | Payer: Medicare Other | Source: Ambulatory Visit | Attending: Internal Medicine | Admitting: Internal Medicine

## 2021-02-05 DIAGNOSIS — C3490 Malignant neoplasm of unspecified part of unspecified bronchus or lung: Secondary | ICD-10-CM | POA: Diagnosis not present

## 2021-02-05 MED ORDER — IOHEXOL 300 MG/ML  SOLN
75.0000 mL | Freq: Once | INTRAMUSCULAR | Status: AC | PRN
  Administered 2021-02-05: 75 mL via INTRAVENOUS

## 2021-02-05 MED ORDER — HEPARIN SOD (PORK) LOCK FLUSH 100 UNIT/ML IV SOLN
INTRAVENOUS | Status: AC
  Administered 2021-02-05: 500 [IU]
  Filled 2021-02-05: qty 5

## 2021-02-05 MED ORDER — SODIUM CHLORIDE (PF) 0.9 % IJ SOLN
INTRAMUSCULAR | Status: AC
  Filled 2021-02-05: qty 50

## 2021-02-05 MED ORDER — HEPARIN SOD (PORK) LOCK FLUSH 100 UNIT/ML IV SOLN: 500.0000 [IU] | Freq: Once | INTRAVENOUS | Status: DC

## 2021-02-07 ENCOUNTER — Other Ambulatory Visit: Payer: Medicare Other

## 2021-02-07 ENCOUNTER — Inpatient Hospital Stay: Payer: Medicare Other

## 2021-02-07 ENCOUNTER — Inpatient Hospital Stay: Payer: Medicare Other | Attending: Internal Medicine | Admitting: Physician Assistant

## 2021-02-07 ENCOUNTER — Other Ambulatory Visit: Payer: Self-pay

## 2021-02-07 VITALS — BP 92/60 | HR 86 | Temp 97.7°F | Resp 16 | Ht 63.0 in | Wt 115.2 lb

## 2021-02-07 DIAGNOSIS — C3491 Malignant neoplasm of unspecified part of right bronchus or lung: Secondary | ICD-10-CM | POA: Diagnosis not present

## 2021-02-07 DIAGNOSIS — Z79899 Other long term (current) drug therapy: Secondary | ICD-10-CM | POA: Diagnosis not present

## 2021-02-07 DIAGNOSIS — G894 Chronic pain syndrome: Secondary | ICD-10-CM | POA: Diagnosis not present

## 2021-02-07 DIAGNOSIS — D6481 Anemia due to antineoplastic chemotherapy: Secondary | ICD-10-CM | POA: Insufficient documentation

## 2021-02-07 DIAGNOSIS — Z923 Personal history of irradiation: Secondary | ICD-10-CM | POA: Insufficient documentation

## 2021-02-07 DIAGNOSIS — R630 Anorexia: Secondary | ICD-10-CM | POA: Insufficient documentation

## 2021-02-07 DIAGNOSIS — R110 Nausea: Secondary | ICD-10-CM | POA: Insufficient documentation

## 2021-02-07 DIAGNOSIS — I700 Atherosclerosis of aorta: Secondary | ICD-10-CM | POA: Diagnosis not present

## 2021-02-07 DIAGNOSIS — E785 Hyperlipidemia, unspecified: Secondary | ICD-10-CM | POA: Diagnosis not present

## 2021-02-07 DIAGNOSIS — I10 Essential (primary) hypertension: Secondary | ICD-10-CM | POA: Insufficient documentation

## 2021-02-07 DIAGNOSIS — C3411 Malignant neoplasm of upper lobe, right bronchus or lung: Secondary | ICD-10-CM | POA: Diagnosis not present

## 2021-02-07 DIAGNOSIS — E876 Hypokalemia: Secondary | ICD-10-CM | POA: Diagnosis not present

## 2021-02-07 DIAGNOSIS — M810 Age-related osteoporosis without current pathological fracture: Secondary | ICD-10-CM | POA: Insufficient documentation

## 2021-02-07 DIAGNOSIS — R5383 Other fatigue: Secondary | ICD-10-CM

## 2021-02-07 DIAGNOSIS — C7989 Secondary malignant neoplasm of other specified sites: Secondary | ICD-10-CM | POA: Diagnosis not present

## 2021-02-07 DIAGNOSIS — Z85828 Personal history of other malignant neoplasm of skin: Secondary | ICD-10-CM | POA: Insufficient documentation

## 2021-02-07 DIAGNOSIS — C778 Secondary and unspecified malignant neoplasm of lymph nodes of multiple regions: Secondary | ICD-10-CM | POA: Diagnosis not present

## 2021-02-07 DIAGNOSIS — M549 Dorsalgia, unspecified: Secondary | ICD-10-CM | POA: Diagnosis not present

## 2021-02-07 DIAGNOSIS — Z5112 Encounter for antineoplastic immunotherapy: Secondary | ICD-10-CM | POA: Insufficient documentation

## 2021-02-07 LAB — CMP (CANCER CENTER ONLY)
ALT: 18 U/L (ref 0–44)
AST: 22 U/L (ref 15–41)
Albumin: 3.1 g/dL — ABNORMAL LOW (ref 3.5–5.0)
Alkaline Phosphatase: 122 U/L (ref 38–126)
Anion gap: 13 (ref 5–15)
BUN: 4 mg/dL — ABNORMAL LOW (ref 8–23)
CO2: 26 mmol/L (ref 22–32)
Calcium: 8.6 mg/dL — ABNORMAL LOW (ref 8.9–10.3)
Chloride: 100 mmol/L (ref 98–111)
Creatinine: 0.79 mg/dL (ref 0.44–1.00)
GFR, Estimated: >60 mL/min (ref >60)
Glucose, Bld: 123 mg/dL — ABNORMAL HIGH (ref 70–99)
Potassium: 2.7 mmol/L — CL (ref 3.5–5.1)
Sodium: 139 mmol/L (ref 135–145)
Total Bilirubin: 0.4 mg/dL (ref 0.3–1.2)
Total Protein: 6.5 g/dL (ref 6.5–8.1)

## 2021-02-07 LAB — CBC WITH DIFFERENTIAL (CANCER CENTER ONLY)
Abs Immature Granulocytes: 0.02 10*3/uL (ref 0.00–0.07)
Basophils Absolute: 0 10*3/uL (ref 0.0–0.1)
Basophils Relative: 0 %
Eosinophils Absolute: 0.2 10*3/uL (ref 0.0–0.5)
Eosinophils Relative: 3 %
HCT: 32.5 % — ABNORMAL LOW (ref 36.0–46.0)
Hemoglobin: 11.4 g/dL — ABNORMAL LOW (ref 12.0–15.0)
Immature Granulocytes: 0 %
Lymphocytes Relative: 23 %
Lymphs Abs: 1.7 10*3/uL (ref 0.7–4.0)
MCH: 37.4 pg — ABNORMAL HIGH (ref 26.0–34.0)
MCHC: 35.1 g/dL (ref 30.0–36.0)
MCV: 106.6 fL — ABNORMAL HIGH (ref 80.0–100.0)
Monocytes Absolute: 0.4 10*3/uL (ref 0.1–1.0)
Monocytes Relative: 5 %
Neutro Abs: 5.1 10*3/uL (ref 1.7–7.7)
Neutrophils Relative %: 69 %
Platelet Count: 271 10*3/uL (ref 150–400)
RBC: 3.05 MIL/uL — ABNORMAL LOW (ref 3.87–5.11)
RDW: 12.7 % (ref 11.5–15.5)
WBC Count: 7.4 10*3/uL (ref 4.0–10.5)
nRBC: 0 % (ref 0.0–0.2)

## 2021-02-07 LAB — TSH: TSH: 2.197 u[IU]/mL (ref 0.308–3.960)

## 2021-02-07 MED ORDER — POTASSIUM CHLORIDE 10 MEQ/100ML IV SOLN: 10.0000 meq | INTRAVENOUS | Status: DC

## 2021-02-07 MED ORDER — POTASSIUM CHLORIDE IN NACL 20-0.9 MEQ/L-% IV SOLN
INTRAVENOUS | Status: DC
  Filled 2021-02-07 (×2): qty 1000

## 2021-02-07 MED ORDER — POTASSIUM CHLORIDE 10 MEQ/100ML IV SOLN
10.0000 meq | Freq: Once | INTRAVENOUS | Status: AC
Start: 2021-02-07 — End: 2021-02-07
  Administered 2021-02-07: 10 meq via INTRAVENOUS

## 2021-02-07 MED ORDER — POTASSIUM CHLORIDE 20 MEQ/15ML (10%) PO SOLN: 20.0000 meq | Freq: Two times a day (BID) | ORAL | 0 refills | Status: DC

## 2021-02-07 MED ORDER — POTASSIUM CHLORIDE 10 MEQ/100ML IV SOLN
INTRAVENOUS | Status: AC
  Filled 2021-02-07: qty 100

## 2021-02-07 MED ORDER — SODIUM CHLORIDE 0.9 % IV SOLN
Freq: Once | INTRAVENOUS | Status: AC
  Filled 2021-02-07: qty 250

## 2021-02-07 MED ORDER — SODIUM CHLORIDE 0.9% FLUSH
10.0000 mL | INTRAVENOUS | Status: DC | PRN
  Administered 2021-02-07: 10 mL
  Filled 2021-02-07: qty 10

## 2021-02-07 MED ORDER — SODIUM CHLORIDE 0.9 % IV SOLN
1200.0000 mg | Freq: Once | INTRAVENOUS | Status: AC
  Administered 2021-02-07: 1200 mg via INTRAVENOUS
  Filled 2021-02-07: qty 20

## 2021-02-07 MED ORDER — HEPARIN SOD (PORK) LOCK FLUSH 100 UNIT/ML IV SOLN
500.0000 [IU] | Freq: Once | INTRAVENOUS | Status: AC | PRN
  Administered 2021-02-07: 500 [IU]
  Filled 2021-02-07: qty 5

## 2021-02-07 NOTE — Patient Instructions (Signed)
[facility]  Discharge Instructions: Thank you for choosing [facility] to provide your oncology and hematology care.   If you have a lab appointment with the [facility], please go directly to the [facility] and check in at the registration area.   Wear comfortable clothing and clothing appropriate for easy access to any Portacath or PICC line.   We strive to give you quality time with your provider. You may need to reschedule your appointment if you arrive late (15 or more minutes).  Arriving late affects you and other patients whose appointments are after yours.  Also, if you miss three or more appointments without notifying the office, you may be dismissed from the clinic at the provider's discretion.      For prescription refill requests, have your pharmacy contact our office and allow 72 hours for refills to be completed.    Today you received the following chemotherapy and/or immunotherapy agents tecentriq     To help prevent nausea and vomiting after your treatment, we encourage you to take your nausea medication as directed.  BELOW ARE SYMPTOMS THAT SHOULD BE REPORTED IMMEDIATELY: . *FEVER GREATER THAN 100.4 F (38 C) OR HIGHER . *CHILLS OR SWEATING . *NAUSEA AND VOMITING THAT IS NOT CONTROLLED WITH YOUR NAUSEA MEDICATION . *UNUSUAL SHORTNESS OF BREATH . *UNUSUAL BRUISING OR BLEEDING . *URINARY PROBLEMS (pain or burning when urinating, or frequent urination) . *BOWEL PROBLEMS (unusual diarrhea, constipation, pain near the anus) . TENDERNESS IN MOUTH AND THROAT WITH OR WITHOUT PRESENCE OF ULCERS (sore throat, sores in mouth, or a toothache) . UNUSUAL RASH, SWELLING OR PAIN  . UNUSUAL VAGINAL DISCHARGE OR ITCHING   Items with * indicate a potential emergency and should be followed up as soon as possible or go to the Emergency Department if any problems should occur.  Please show the CHEMOTHERAPY ALERT CARD or IMMUNOTHERAPY ALERT  CARD at check-in to the Emergency Department and triage nurse.  Should you have questions after your visit or need to cancel or reschedule your appointment, please contact [facility]  Dept: [phone]  and follow the prompts.  Office hours are 8:00 a.m. to 4:30 p.m. Monday - Friday. Please note that voicemails left after 4:00 p.m. may not be returned until the following business day.  We are closed weekends and major holidays. You have access to a nurse at all times for urgent questions. Please call the main number to the clinic Dept: [phone] and follow the prompts.   For any non-urgent questions, you may also contact your provider using MyChart. We now offer e-Visits for anyone 52 and older to request care online for non-urgent symptoms. For details visit [email or web address].   Also download the MyChart app! Go to the app store, search "MyChart", open the app, select [facility], and log in with your MyChart username and password.  Due to Covid, a mask is required upon entering the hospital/clinic. If you do not have a mask, one will be given to you upon arrival. For doctor visits, patients may have 1 support person aged 65 or older with them. For treatment visits, patients cannot have anyone with them due to current Covid guidelines and our immunocompromised population.     Potassium chloride injection What is this medicine? POTASSIUM CHLORIDE (poe TASS i um KLOOR ide) is a potassium supplement used to prevent and to treat low potassium. Potassium is important for the heart, muscles, and nerves. Too much  or too little potassium in the body can cause serious problems. This medicine may be used for other purposes; ask your health care provider or pharmacist if you have questions. COMMON BRAND NAME(S): PROAMP What should I tell my health care provider before I take this medicine? They need to know if you have any of these conditions:  Addison  disease  dehydration  diabetes (high blood sugar)  heart disease  high levels of potassium in the blood  irregular heartbeat or rhythm  kidney disease  large areas of burned skin  an unusual or allergic reaction to potassium, other medicines, foods, dyes, or preservatives  pregnant or trying to get pregnant  breast-feeding How should I use this medicine? This medicine is injected into a vein. It is given by a health care provider in a hospital or clinic setting. Talk to your health care provider about the use of this medicine in children. Special care may be needed. Overdosage: If you think you have taken too much of this medicine contact a poison control center or emergency room at once. NOTE: This medicine is only for you. Do not share this medicine with others. What if I miss a dose? This does not apply. This medicine is not for regular use. What may interact with this medicine? Do not take this medicine with any of the following medications:  certain diuretics such as spironolactone, triamterene  eplerenone  sodium polystyrene sulfonate This medicine may also interact with the following medications:  certain medicines for blood pressure or heart disease like lisinopril, losartan, quinapril, valsartan  medicines that lower your chance of fighting infection such as cyclosporine, tacrolimus  NSAIDs, medicines for pain and inflammation, like ibuprofen or naproxen  other potassium supplements  salt substitutes This list may not describe all possible interactions. Give your health care provider a list of all the medicines, herbs, non-prescription drugs, or dietary supplements you use. Also tell them if you smoke, drink alcohol, or use illegal drugs. Some items may interact with your medicine. What should I watch for while using this medicine? Visit your health care provider for regular checks on your progress. Tell your health care provider if your symptoms do not start  to get better or if they get worse. You may need blood work while you are taking this medicine. Avoid salt substitutes unless you are told otherwise by your health care provider. What side effects may I notice from receiving this medicine? Side effects that you should report to your doctor or health care professional as soon as possible:  allergic reactions (skin rash, itching, hives, swelling of the face, lips, tongue, or throat)  confusion  high potassium levels (muscle weakness, fast or irregular heartbeat)  low blood pressure (dizziness, feeling faint or lightheaded, blurry vision)  pain, tingling, or numbness in lips, hands, or feet  pain, redness, or irritation at site where injected  trouble breathing Side effects that usually do not require medical attention (report to your doctor or health care professional if they continue or are bothersome):  diarrhea  nausea, vomiting  passing gas  stomach pain This list may not describe all possible side effects. Call your doctor for medical advice about side effects. You may report side effects to FDA at 1-800-FDA-1088. Where should I keep my medicine? This medicine is given in a hospital or clinic. It will not be stored at home. NOTE: This sheet is a summary. It may not cover all possible information. If you have questions about this medicine,  talk to your doctor, pharmacist, or health care provider.  2021 Elsevier/Gold Standard (2019-06-17 18:18:09)

## 2021-03-01 ENCOUNTER — Inpatient Hospital Stay (HOSPITAL_BASED_OUTPATIENT_CLINIC_OR_DEPARTMENT_OTHER): Payer: Medicare Other | Admitting: Internal Medicine

## 2021-03-01 ENCOUNTER — Inpatient Hospital Stay: Payer: Medicare Other

## 2021-03-01 ENCOUNTER — Encounter: Payer: Self-pay | Admitting: Internal Medicine

## 2021-03-01 ENCOUNTER — Other Ambulatory Visit: Payer: Self-pay | Admitting: Internal Medicine

## 2021-03-01 ENCOUNTER — Other Ambulatory Visit: Payer: Self-pay | Admitting: Physician Assistant

## 2021-03-01 ENCOUNTER — Other Ambulatory Visit: Payer: Self-pay

## 2021-03-01 VITALS — BP 102/82 | HR 82 | Temp 97.8°F | Resp 19 | Ht 63.0 in | Wt 112.1 lb

## 2021-03-01 DIAGNOSIS — Z5112 Encounter for antineoplastic immunotherapy: Secondary | ICD-10-CM

## 2021-03-01 DIAGNOSIS — C3491 Malignant neoplasm of unspecified part of right bronchus or lung: Secondary | ICD-10-CM

## 2021-03-01 DIAGNOSIS — R5383 Other fatigue: Secondary | ICD-10-CM

## 2021-03-01 DIAGNOSIS — E876 Hypokalemia: Secondary | ICD-10-CM

## 2021-03-01 LAB — CMP (CANCER CENTER ONLY)
ALT: 12 U/L (ref 0–44)
AST: 18 U/L (ref 15–41)
Albumin: 3 g/dL — ABNORMAL LOW (ref 3.5–5.0)
Alkaline Phosphatase: 125 U/L (ref 38–126)
Anion gap: 10 (ref 5–15)
BUN: 4 mg/dL — ABNORMAL LOW (ref 8–23)
CO2: 26 mmol/L (ref 22–32)
Calcium: 8.3 mg/dL — ABNORMAL LOW (ref 8.9–10.3)
Chloride: 102 mmol/L (ref 98–111)
Creatinine: 0.75 mg/dL (ref 0.44–1.00)
GFR, Estimated: >60 mL/min (ref >60)
Glucose, Bld: 108 mg/dL — ABNORMAL HIGH (ref 70–99)
Potassium: 2.9 mmol/L — ABNORMAL LOW (ref 3.5–5.1)
Sodium: 138 mmol/L (ref 135–145)
Total Bilirubin: 0.3 mg/dL (ref 0.3–1.2)
Total Protein: 6 g/dL — ABNORMAL LOW (ref 6.5–8.1)

## 2021-03-01 LAB — CBC WITH DIFFERENTIAL (CANCER CENTER ONLY)
Abs Immature Granulocytes: 0.03 10*3/uL (ref 0.00–0.07)
Basophils Absolute: 0 10*3/uL (ref 0.0–0.1)
Basophils Relative: 0 %
Eosinophils Absolute: 0.1 10*3/uL (ref 0.0–0.5)
Eosinophils Relative: 1 %
HCT: 32.6 % — ABNORMAL LOW (ref 36.0–46.0)
Hemoglobin: 11.6 g/dL — ABNORMAL LOW (ref 12.0–15.0)
Immature Granulocytes: 0 %
Lymphocytes Relative: 28 %
Lymphs Abs: 2 10*3/uL (ref 0.7–4.0)
MCH: 38.4 pg — ABNORMAL HIGH (ref 26.0–34.0)
MCHC: 35.6 g/dL (ref 30.0–36.0)
MCV: 107.9 fL — ABNORMAL HIGH (ref 80.0–100.0)
Monocytes Absolute: 0.4 10*3/uL (ref 0.1–1.0)
Monocytes Relative: 5 %
Neutro Abs: 4.7 10*3/uL (ref 1.7–7.7)
Neutrophils Relative %: 66 %
Platelet Count: 241 10*3/uL (ref 150–400)
RBC: 3.02 MIL/uL — ABNORMAL LOW (ref 3.87–5.11)
RDW: 12.8 % (ref 11.5–15.5)
WBC Count: 7.3 10*3/uL (ref 4.0–10.5)
nRBC: 0 % (ref 0.0–0.2)

## 2021-03-01 LAB — TSH: TSH: 1.395 u[IU]/mL (ref 0.308–3.960)

## 2021-03-01 LAB — MAGNESIUM: Magnesium: 1.3 mg/dL — ABNORMAL LOW (ref 1.7–2.4)

## 2021-03-01 MED ORDER — POTASSIUM CHLORIDE 10 MEQ/100ML IV SOLN
10.0000 meq | INTRAVENOUS | Status: AC
  Administered 2021-03-01 (×2): 10 meq via INTRAVENOUS

## 2021-03-01 MED ORDER — SODIUM CHLORIDE 0.9% FLUSH
10.0000 mL | INTRAVENOUS | Status: DC | PRN
  Administered 2021-03-01 (×2): 10 mL
  Filled 2021-03-01: qty 10

## 2021-03-01 MED ORDER — SODIUM CHLORIDE 0.9 % IV SOLN
1200.0000 mg | Freq: Once | INTRAVENOUS | Status: AC
  Administered 2021-03-01: 1200 mg via INTRAVENOUS
  Filled 2021-03-01: qty 20

## 2021-03-01 MED ORDER — MAGNESIUM SULFATE 2 GM/50ML IV SOLN
2.0000 g | Freq: Once | INTRAVENOUS | Status: AC
Start: 2021-03-01 — End: 2021-03-01
  Administered 2021-03-01: 2 g via INTRAVENOUS

## 2021-03-01 MED ORDER — POTASSIUM CHLORIDE 10 MEQ/100ML IV SOLN
INTRAVENOUS | Status: AC
  Filled 2021-03-01: qty 100

## 2021-03-01 MED ORDER — HEPARIN SOD (PORK) LOCK FLUSH 100 UNIT/ML IV SOLN
500.0000 [IU] | Freq: Once | INTRAVENOUS | Status: AC | PRN
  Administered 2021-03-01: 500 [IU]
  Filled 2021-03-01: qty 5

## 2021-03-01 MED ORDER — SODIUM CHLORIDE 0.9 % IV SOLN
Freq: Once | INTRAVENOUS | Status: AC
  Filled 2021-03-01: qty 250

## 2021-03-01 MED ORDER — POTASSIUM CHLORIDE 20 MEQ/15ML (10%) PO SOLN: 20.0000 meq | Freq: Every day | ORAL | 0 refills | Status: DC

## 2021-03-01 MED ORDER — MAGNESIUM SULFATE 2 GM/50ML IV SOLN
INTRAVENOUS | Status: AC
  Filled 2021-03-01: qty 50

## 2021-03-01 NOTE — Patient Instructions (Signed)
Implanted Port Home Guide An implanted port is a device that is placed under the skin. It is usually placed in the chest. The device can be used to give IV medicine, to take blood, or for dialysis. You may have an implanted port if: You need IV medicine that would be irritating to the small veins in your hands or arms. You need IV medicines, such as antibiotics, for a long period of time. You need IV nutrition for a long period of time. You need dialysis. When you have a port, your health care provider can choose to use the port instead of veins in your arms for these procedures. You may have fewer limitations when using a port than you would if you used other types of long-term IVs, and you will likely be able to return to normal activities afteryour incision heals. An implanted port has two main parts: Reservoir. The reservoir is the part where a needle is inserted to give medicines or draw blood. The reservoir is round. After it is placed, it appears as a small, raised area under your skin. Catheter. The catheter is a thin, flexible tube that connects the reservoir to a vein. Medicine that is inserted into the reservoir goes into the catheter and then into the vein. How is my port accessed? To access your port: A numbing cream may be placed on the skin over the port site. Your health care provider will put on a mask and sterile gloves. The skin over your port will be cleaned carefully with a germ-killing soap and allowed to dry. Your health care provider will gently pinch the port and insert a needle into it. Your health care provider will check for a blood return to make sure the port is in the vein and is not clogged. If your port needs to remain accessed to get medicine continuously (constant infusion), your health care provider will place a clear bandage (dressing) over the needle site. The dressing and needle will need to be changed every week, or as told by your health care provider. What  is flushing? Flushing helps keep the port from getting clogged. Follow instructions from your health care provider about how and when to flush the port. Ports are usually flushed with saline solution or a medicine called heparin. The need for flushing will depend on how the port is used: If the port is only used from time to time to give medicines or draw blood, the port may need to be flushed: Before and after medicines have been given. Before and after blood has been drawn. As part of routine maintenance. Flushing may be recommended every 4-6 weeks. If a constant infusion is running, the port may not need to be flushed. Throw away any syringes in a disposal container that is meant for sharp items (sharps container). You can buy a sharps container from a pharmacy, or you can make one by using an empty hard plastic bottle with a cover. How long will my port stay implanted? The port can stay in for as long as your health care provider thinks it is needed. When it is time for the port to come out, a surgery will be done to remove it. The surgery will be similar to the procedure that was done to putthe port in. Follow these instructions at home:  Flush your port as told by your health care provider. If you need an infusion over several days, follow instructions from your health care provider about how to take   care of your port site. Make sure you: Wash your hands with soap and water before you change your dressing. If soap and water are not available, use alcohol-based hand sanitizer. Change your dressing as told by your health care provider. Place any used dressings or infusion bags into a plastic bag. Throw that bag in the trash. Keep the dressing that covers the needle clean and dry. Do not get it wet. Do not use scissors or sharp objects near the tube. Keep the tube clamped, unless it is being used. Check your port site every day for signs of infection. Check for: Redness, swelling, or  pain. Fluid or blood. Pus or a bad smell. Protect the skin around the port site. Avoid wearing bra straps that rub or irritate the site. Protect the skin around your port from seat belts. Place a soft pad over your chest if needed. Bathe or shower as told by your health care provider. The site may get wet as long as you are not actively receiving an infusion. Return to your normal activities as told by your health care provider. Ask your health care provider what activities are safe for you. Carry a medical alert card or wear a medical alert bracelet at all times. This will let health care providers know that you have an implanted port in case of an emergency. Get help right away if: You have redness, swelling, or pain at the port site. You have fluid or blood coming from your port site. You have pus or a bad smell coming from the port site. You have a fever. Summary Implanted ports are usually placed in the chest for long-term IV access. Follow instructions from your health care provider about flushing the port and changing bandages (dressings). Take care of the area around your port by avoiding clothing that puts pressure on the area, and by watching for signs of infection. Protect the skin around your port from seat belts. Place a soft pad over your chest if needed. Get help right away if you have a fever or you have redness, swelling, pain, drainage, or a bad smell at the port site. This information is not intended to replace advice given to you by your health care provider. Make sure you discuss any questions you have with your healthcare provider. Document Revised: 01/03/2020 Document Reviewed: 01/03/2020 Elsevier Patient Education  2022 Elsevier Inc.  

## 2021-03-01 NOTE — Patient Instructions (Addendum)
[facility]  Discharge Instructions: Thank you for choosing [facility] to provide your oncology and hematology care.   If you have a lab appointment with the [facility], please go directly to the [facility] and check in at the registration area.   Wear comfortable clothing and clothing appropriate for easy access to any Portacath or PICC line.   We strive to give you quality time with your provider. You may need to reschedule your appointment if you arrive late (15 or more minutes).  Arriving late affects you and other patients whose appointments are after yours.  Also, if you miss three or more appointments without notifying the office, you may be dismissed from the clinic at the provider's discretion.      For prescription refill requests, have your pharmacy contact our office and allow 72 hours for refills to be completed.    Today you received the following chemotherapy and/or immunotherapy agents Tecentriq      To help prevent nausea and vomiting after your treatment, we encourage you to take your nausea medication as directed.  BELOW ARE SYMPTOMS THAT SHOULD BE REPORTED IMMEDIATELY: *FEVER GREATER THAN 100.4 F (38 C) OR HIGHER *CHILLS OR SWEATING *NAUSEA AND VOMITING THAT IS NOT CONTROLLED WITH YOUR NAUSEA MEDICATION *UNUSUAL SHORTNESS OF BREATH *UNUSUAL BRUISING OR BLEEDING *URINARY PROBLEMS (pain or burning when urinating, or frequent urination) *BOWEL PROBLEMS (unusual diarrhea, constipation, pain near the anus) TENDERNESS IN MOUTH AND THROAT WITH OR WITHOUT PRESENCE OF ULCERS (sore throat, sores in mouth, or a toothache) UNUSUAL RASH, SWELLING OR PAIN  UNUSUAL VAGINAL DISCHARGE OR ITCHING   Items with * indicate a potential emergency and should be followed up as soon as possible or go to the Emergency Department if any problems should occur.  Please show the CHEMOTHERAPY ALERT CARD or IMMUNOTHERAPY ALERT CARD at check-in to  the Emergency Department and triage nurse.  Should you have questions after your visit or need to cancel or reschedule your appointment, please contact [facility]  Dept: [phone]  and follow the prompts.  Office hours are 8:00 a.m. to 4:30 p.m. Monday - Friday. Please note that voicemails left after 4:00 p.m. may not be returned until the following business day.  We are closed weekends and major holidays. You have access to a nurse at all times for urgent questions. Please call the main number to the clinic Dept: [phone] and follow the prompts.   For any non-urgent questions, you may also contact your provider using MyChart. We now offer e-Visits for anyone 60 and older to request care online for non-urgent symptoms. For details visit [email or web address].   Also download the MyChart app! Go to the app store, search "MyChart", open the app, select [facility], and log in with your MyChart username and password.  Due to Covid, a mask is required upon entering the hospital/clinic. If you do not have a mask, one will be given to you upon arrival. For doctor visits, patients may have 1 support person aged 56 or older with them. For treatment visits, patients cannot have anyone with them due to current Covid guidelines and our immunocompromised population.  Hypomagnesemia Hypomagnesemia is a condition in which the level of magnesium in the blood is low. Magnesium is a mineral that is found in many foods. It is used in many different processes in the body. Hypomagnesemia can affect every organ in thebody. In severe cases, it can cause life-threatening  problems. What are the causes? This condition may be caused by: Not getting enough magnesium in your diet. Malnutrition. Problems with absorbing magnesium from the intestines. Dehydration. Alcohol abuse. Vomiting. Severe or chronic diarrhea. Some medicines, including medicines that make you urinate more  (diuretics). Certain diseases, such as kidney disease, diabetes, celiac disease, and overactive thyroid. What are the signs or symptoms? Symptoms of this condition include: Loss of appetite. Nausea and vomiting. Involuntary shaking or trembling of a body part (tremor). Muscle weakness. Tingling in the arms and legs. Sudden tightening of muscles (muscle spasms). Confusion. Psychiatric issues, such as depression, irritability, or psychosis. A feeling of fluttering of the heart. Seizures. These symptoms are more severe if magnesium levels drop suddenly. How is this diagnosed? This condition may be diagnosed based on: Your symptoms and medical history. A physical exam. Blood and urine tests. How is this treated? Treatment depends on the cause and the severity of the condition. It may be treated with: A magnesium supplement. This can be taken in pill form. If the condition is severe, magnesium is usually given through an IV. Changes to your diet. You may be directed to eat foods that have a lot of magnesium, such as green leafy vegetables, peas, beans, and nuts. Stopping any intake of alcohol. Follow these instructions at home:     Make sure that your diet includes foods with magnesium. Foods that have a lot of magnesium in them include: Green leafy vegetables, such as spinach and broccoli. Beans and peas. Nuts and seeds, such as almonds and sunflower seeds. Whole grains, such as whole grain bread and fortified cereals. Take magnesium supplements if your health care provider tells you to do that. Take them as directed. Take over-the-counter and prescription medicines only as told by your health care provider. Have your magnesium levels monitored as told by your health care provider. When you are active, drink fluids that contain electrolytes. Avoid drinking alcohol. Keep all follow-up visits as told by your health care provider. This is important. Contact a health care provider  if: You get worse instead of better. Your symptoms return. Get help right away if you: Develop severe muscle weakness. Have trouble breathing. Feel that your heart is racing. Summary Hypomagnesemia is a condition in which the level of magnesium in the blood is low. Hypomagnesemia can affect every organ in the body. Treatment may include eating more foods that contain magnesium, taking magnesium supplements, and not drinking alcohol. Have your magnesium levels monitored as told by your health care provider. This information is not intended to replace advice given to you by your health care provider. Make sure you discuss any questions you have with your healthcare provider. Document Revised: 01/20/2020 Document Reviewed: 01/20/2020 Elsevier Patient Education  Reader.

## 2021-03-01 NOTE — Progress Notes (Signed)
Debra Kidd [phone]   [phone]  OFFICE PROGRESS NOTE  Debra Roers, MD [address] [place] [address]  DIAGNOSIS: Extensive stage (T2a, N3, [name], large right anterior mediastinal and supraclavicular lymphadenopathy as well as pancreatic and splenic metastasis diagnosed in September 2017.  The patient had disease progression in October 2019.  PRIOR THERAPY:  1) Systemic chemotherapy was carboplatin for AUC of 5 on day 1 and etoposide 100 MG/M2 on days 1, 2 and 3 with Neulasta support. Status post 6 cycles with significant response of her disease. 2) Prophylactic cranial irradiation under the care of Dr. Sondra Come on 12/02/2016. 3) stereotactic radiotherapy to the recurrent right upper lobe pulmonary nodule under the care of Dr. Sondra Come completed November 10, 2017. 4) Retreatment with systemic chemotherapy with carboplatin for AUC of 5 on day 1 and etoposide 100 mg/M2 on days 1, 2 and 3 as well as Tecentriq (Atezolizumab) 1200 mg IV every 3 weeks with Neulasta support.  First dose June 22, 2018 for disease recurrence.  Status post 5 cycles.  Starting from cycle #2 her dose of carboplatin will be reduced to AUC of 4 and etoposide 80 mg/M2 on days 1, 2 and 3 in addition to the regular dose of Tecentriq.  CURRENT THERAPY: Maintenance treatment with single agent Tecentriq 1200 mg IV every 3 weeks.  Status post 41 cycles.  INTERVAL HISTORY: Debra Kidd 60 y.o. female returns to the clinic today for follow-up visit.  The patient is feeling fine today with no concerning complaints except for the chronic back pain and she is currently on Percocet on as-needed basis.  She has chronic compression fracture of the T9 vertebral body.  The patient denied having any chest pain but has shortness of breath with exertion and also when taking a warm shower.  She denied having any chest pain, cough or  hemoptysis.  She denied having any fever or chills.  She has no nausea, vomiting, diarrhea or constipation.  She lost few pounds since her last visit.  The patient is here today for evaluation before starting cycle #46 of her treatment.   MEDICAL HISTORY: Past Medical History:  Diagnosis Date   Basal cell carcinoma of cheek    L side of face   Blood type, Rh positive    Cancer (HCC)    Chronic pain syndrome    Depression 08/07/2016   Encounter for antineoplastic chemotherapy 07/17/2016   History of external beam radiation therapy 11/19/16-12/02/16   brain 25 Gy in 10 fractions   Hyperlipidemia    Hypertension    Hypertension 08/07/2016   Osteoporosis    Small cell lung cancer ([place]) dx'd 05/2016    ALLERGIES:  is allergic to codeine, motrin [ibuprofen], thiazide-type diuretics, and vicodin [hydrocodone-acetaminophen].  MEDICATIONS:  Current Outpatient Medications  Medication Sig Dispense Refill   clopidogrel (PLAVIX) 75 MG tablet Take 1 tablet (75 mg total) by mouth daily. 30 tablet 0   diazepam (VALIUM) 5 MG tablet Take 5 mg by mouth 3 (three) times daily as needed for anxiety.   2   hydrOXYzine (ATARAX/VISTARIL) 10 MG tablet Take 1 tablet (10 mg total) by mouth 3 (three) times daily as needed. 90 tablet 1   lidocaine-prilocaine (EMLA) cream Apply 1 application topically as needed. 30 g 0   oxyCODONE-acetaminophen (PERCOCET) 5-325 MG tablet Take 1 tablet by mouth every 6 (six) hours as  needed for severe pain. 30 tablet 0   potassium chloride 20 MEQ/15ML (10%) SOLN Take 15 mLs (20 mEq total) by mouth 2 (two) times daily. 210 mL 0   prochlorperazine (COMPAZINE) 10 MG tablet Take 1 tablet (10 mg total) by mouth every 6 (six) hours as needed for nausea or vomiting. 30 tablet 2   traMADol (ULTRAM) 50 MG tablet Take 50 mg by mouth 4 (four) times daily as needed for moderate pain (confirmed with her Pharmacy-Dr K. Little 03/15 she received #120).     No current facility-administered  medications for this visit.    SURGICAL HISTORY:  Past Surgical History:  Procedure Laterality Date   ABDOMINAL HYSTERECTOMY  1981   APPENDECTOMY     at age 78   EYE SURGERY     left   IR GENERIC HISTORICAL  06/03/2016   IR FLUORO GUIDE PORT INSERTION RIGHT 06/03/2016 [facility]   IR GENERIC HISTORICAL  06/03/2016   IR US GUIDE VASC ACCESS RIGHT 06/03/2016 [facility]   SKIN CANCER EXCISION     basal cell carcinoma L side of face    REVIEW OF SYSTEMS:  A comprehensive review of systems was negative except for: Constitutional: positive for anorexia, fatigue, and weight loss Musculoskeletal: positive for back pain   PHYSICAL EXAMINATION: General appearance: alert, cooperative, and no distress Head: Normocephalic, without obvious abnormality, atraumatic Neck: no adenopathy, no JVD, supple, symmetrical, trachea midline, and thyroid not enlarged, symmetric, no tenderness/mass/nodules Lymph nodes: Cervical, supraclavicular, and axillary nodes normal. Resp: clear to auscultation bilaterally Back: symmetric, no curvature. ROM normal. No CVA tenderness. Cardio: regular rate and rhythm, S1, S2 normal, no murmur, click, rub or gallop GI: soft, non-tender; bowel sounds normal; no masses,  no organomegaly Extremities: extremities normal, atraumatic, no cyanosis or edema  ECOG PERFORMANCE STATUS: 1 - Symptomatic but completely ambulatory  Blood pressure 102/82, pulse 82, temperature 97.8 F (36.6 C), temperature source Tympanic, resp. rate 19, height 5\' 3"  (1.6 m), weight 112 lb 1.6 oz (50.8 kg), SpO2 100 %.  LABORATORY DATA: Lab Results  Component Value Date   WBC 7.4 02/07/2021   HGB 11.4 (L) 02/07/2021   HCT 32.5 (L) 02/07/2021   MCV 106.6 (H) 02/07/2021   PLT 271 02/07/2021      Chemistry      Component Value Date/Time   NA 139 02/07/2021 1120   NA 141 08/01/2017 0835   K 2.7 (LL) 02/07/2021 1120   K 3.5 08/01/2017 0835   CL 100 02/07/2021 1120   CO2 26 02/07/2021 1120    CO2 25 08/01/2017 0835   BUN 4 (L) 02/07/2021 1120   BUN 8.0 08/01/2017 0835   CREATININE 0.79 02/07/2021 1120   CREATININE 0.9 08/01/2017 0835      Component Value Date/Time   CALCIUM 8.6 (L) 02/07/2021 1120   CALCIUM 9.9 08/01/2017 0835   ALKPHOS 122 02/07/2021 1120   ALKPHOS 142 08/01/2017 0835   AST 22 02/07/2021 1120   AST 15 08/01/2017 0835   ALT 18 02/07/2021 1120   ALT 19 08/01/2017 0835   BILITOT 0.4 02/07/2021 1120   BILITOT 0.35 08/01/2017 0835       RADIOGRAPHIC STUDIES: CT Chest W Contrast  Result Date: 02/05/2021 CLINICAL DATA:  60 year old female with small cell lung cancer diagnosed 2017. Chemotherapy. Radiation therapy complete. Immunotherapy in progress. EXAM: CT CHEST, ABDOMEN, AND PELVIS WITH CONTRAST TECHNIQUE: Multidetector CT imaging of the chest, abdomen and pelvis was performed following the standard protocol during bolus  administration of intravenous contrast. CONTRAST:  40mL OMNIPAQUE IOHEXOL 300 MG/ML  SOLN COMPARISON:  11/13/2020 FINDINGS: CT CHEST FINDINGS Cardiovascular: Coronary artery calcification and aortic atherosclerotic calcification. Eccentric plaque along the transverse arch is unchanged from prior. Port in the anterior chest wall with tip in distal SVC. Mediastinum/Nodes: No axillary or supraclavicular adenopathy. No mediastinal or hilar adenopathy. No pericardial fluid. Esophagus normal. Lungs/Pleura: Band of pleuroparenchymal fibrosis at the lateral RIGHT lung apex is unchanged from prior and consistent postradiation change. No new or suspicious pulmonary nodules. Musculoskeletal: No aggressive osseous lesion. CT ABDOMEN AND PELVIS FINDINGS Hepatobiliary: Low-attenuation along the falciform ligament is most consistent with focal fatty infiltration and not changed from prior. Pancreas: Pancreas is normal. No ductal dilatation. No pancreatic inflammation. Spleen: Normal spleen Adrenals/urinary tract: Adrenal glands and kidneys are normal. The ureters  and bladder normal. Stomach/Bowel: Stomach, small bowel, appendix, and cecum are normal. The colon and rectosigmoid colon are normal. Vascular/Lymphatic: Abdominal aorta is normal caliber. There is no retroperitoneal or periportal lymphadenopathy. No pelvic lymphadenopathy. Reproductive: Post hysterectomy.  Adnexa unremarkable Other: No peritoneal metastasis Musculoskeletal: No aggressive osseous lesion. Chronic compression deformity of the T9 vertebral body. IMPRESSION: 1. No evidence small cell lung cancer recurrence or metastasis. 2. Stable post therapy change at the RIGHT lung apex. 3. No interval change from 11/13/2020. Electronically Signed   By: Suzy Bouchard M.D.   On: 02/05/2021 09:20   CT Abdomen Pelvis W Contrast  Result Date: 02/05/2021 CLINICAL DATA:  60 year old female with small cell lung cancer diagnosed 2017. Chemotherapy. Radiation therapy complete. Immunotherapy in progress. EXAM: CT CHEST, ABDOMEN, AND PELVIS WITH CONTRAST TECHNIQUE: Multidetector CT imaging of the chest, abdomen and pelvis was performed following the standard protocol during bolus administration of intravenous contrast. CONTRAST:  42mL OMNIPAQUE IOHEXOL 300 MG/ML  SOLN COMPARISON:  11/13/2020 FINDINGS: CT CHEST FINDINGS Cardiovascular: Coronary artery calcification and aortic atherosclerotic calcification. Eccentric plaque along the transverse arch is unchanged from prior. Port in the anterior chest wall with tip in distal SVC. Mediastinum/Nodes: No axillary or supraclavicular adenopathy. No mediastinal or hilar adenopathy. No pericardial fluid. Esophagus normal. Lungs/Pleura: Band of pleuroparenchymal fibrosis at the lateral RIGHT lung apex is unchanged from prior and consistent postradiation change. No new or suspicious pulmonary nodules. Musculoskeletal: No aggressive osseous lesion. CT ABDOMEN AND PELVIS FINDINGS Hepatobiliary: Low-attenuation along the falciform ligament is most consistent with focal fatty  infiltration and not changed from prior. Pancreas: Pancreas is normal. No ductal dilatation. No pancreatic inflammation. Spleen: Normal spleen Adrenals/urinary tract: Adrenal glands and kidneys are normal. The ureters and bladder normal. Stomach/Bowel: Stomach, small bowel, appendix, and cecum are normal. The colon and rectosigmoid colon are normal. Vascular/Lymphatic: Abdominal aorta is normal caliber. There is no retroperitoneal or periportal lymphadenopathy. No pelvic lymphadenopathy. Reproductive: Post hysterectomy.  Adnexa unremarkable Other: No peritoneal metastasis Musculoskeletal: No aggressive osseous lesion. Chronic compression deformity of the T9 vertebral body. IMPRESSION: 1. No evidence small cell lung cancer recurrence or metastasis. 2. Stable post therapy change at the RIGHT lung apex. 3. No interval change from 11/13/2020. Electronically Signed   By: Suzy Bouchard M.D.   On: 02/05/2021 09:20    ASSESSMENT AND PLAN:  This is a very pleasant 60 years old white female with extensive stage small cell lung cancer status post systemic chemotherapy with carbo platinum and etoposide for 6 cycles and the patient rotated her treatment well except for chemotherapy-induced anemia and requirement for PRBCs and platelet transfusion. She had significant improvement in her disease with  the chemotherapy. She also had prophylactic cranial irradiation. She also underwent stereotactic radiotherapy to progressive right upper lobe pulmonary nodule. The patient has been in observation for close to 2 years. Repeat CT scan of the chest, abdomen and pelvis performed recently showed evidence for disease progression in the abdomen. The patient was started on systemic chemotherapy again with carboplatin, etoposide and Tecentriq status post 45 cycles.  Starting from cycle #5 the patient is treated with maintenance single agent Tecentriq. The patient continues to tolerate this treatment well with no concerning adverse  effects. I recommended for her to proceed with cycle #46 today as planned. I will see her back for follow-up visit in 3 weeks for evaluation before the next cycle of her treatment. For the chronic back pain, she is currently on Percocet on as-needed basis. She was advised to call immediately if she has any concerning symptoms in the interval. The patient voices understanding of current disease status and treatment options and is in agreement with the current care plan. All questions were answered. The patient knows to call the clinic with any problems, questions or concerns. We can certainly see the patient much sooner if necessary.  Disclaimer: This note was dictated with voice recognition software. Similar sounding words can inadvertently be transcribed and may not be corrected upon review.

## 2021-03-07 ENCOUNTER — Telehealth: Payer: Self-pay | Admitting: Internal Medicine

## 2021-03-07 NOTE — Telephone Encounter (Signed)
Scheduled per los. Called and left msg. Mailed printout  °

## 2021-03-16 NOTE — Progress Notes (Signed)
[facility] OFFICE PROGRESS NOTE  [name], MD [address] [place] [address]  DIAGNOSIS: Extensive stage (T2a, N3, [name], large right anterior mediastinal and supraclavicular lymphadenopathy as well as pancreatic and splenic metastasis diagnosed in September 2017.  The patient had disease progression in October 2019.  PRIOR THERAPY: 1) Systemic chemotherapy was carboplatin for AUC of 5 on day 1 and etoposide 100 MG/M2 on days 1, 2 and 3 with Neulasta support. Status post 6 cycles with significant response of her disease. 2) Prophylactic cranial irradiation under the care of Dr. Sondra Come on 12/02/2016. 3) stereotactic radiotherapy to the recurrent right upper lobe pulmonary nodule under the care of Dr. Sondra Come completed November 10, 2017. 4) Retreatment with systemic chemotherapy with carboplatin for AUC of 5 on day 1 and etoposide 100 mg/M2 on days 1, 2 and 3 as well as Tecentriq (Atezolizumab) 1200 mg IV every 3 weeks with Neulasta support.  First dose June 22, 2018 for disease recurrence.  Status post 5 cycles.  Starting from cycle #2 her dose of carboplatin will be reduced to AUC of 4 and etoposide 80 mg/M2 on days 1, 2 and 3 in addition to the regular dose of Tecentriq.  CURRENT THERAPY: Maintenance treatment with single agent Tecentriq 1200 mg IV every 3 weeks.  Status post 46 cycles.  INTERVAL HISTORY: Debra Kidd 60 y.o. female returns to the clinic today for a follow up visit. The patient is feeling fatigued today. Her main concern is related to fatigue and weight loss.  The patient is very sedentary.  She had a stroke several years ago and was supposed to undergo physical therapy but she reportedly could not afford it.  Therefore, the patient is not very active.  She has a leg peddler machine at home but she is only used it twice.  She reportedly cannot swim and therefore pool exercises/water aerobics  would not be a good option for her either.  She is very thin and deconditioned looking and has a poor performance status.  She also reportedly does not eat 3 meals a day.  She states that she does not eat dinner.  She has [employer] breakfast essentials in the morning but does not take any other supplemental drinks.  She states that she does not drink any water throughout the day.  She lost approximately 2 pounds since her last appointment and a few pounds from the appointment prior to that.  When I encouraged her to take supplemental drinks or to make her own supplemental drink in a blender she stated she does not have a blender and cannot afford supplemental drinks.  She is wondering if she can get a B12 injection today to see if it would help with her energy.  Per review of her labs, she does have some mild anemia that is macrocytic.    She denies any other concerning symptoms including fevers, chills, and night sweats. She has had nausea associated to taking her meds without food. She also denies vomiting, diarrhea, and rashes. She has no change in her baseline cough or shortness of breath and denies hemoptysis. The patient continues to tolerate treatment with immunotherapy with Tecentriq well without any adverse effects except occasional itching for which she uses atarax. Her itching has been better recently though.  She denies any diarrhea or constipation.  She is here for evaluation before starting cycle #47.   MEDICAL HISTORY: Past  Medical History:  Diagnosis Date   Basal cell carcinoma of cheek    L side of face   Blood type, Rh positive    Cancer (HCC)    Chronic pain syndrome    Depression 08/07/2016   Encounter for antineoplastic chemotherapy 07/17/2016   History of external beam radiation therapy 11/19/16-12/02/16   brain 25 Gy in 10 fractions   Hyperlipidemia    Hypertension    Hypertension 08/07/2016   Osteoporosis    Small cell lung cancer ([place]) dx'd 05/2016    ALLERGIES:  is allergic  to codeine, motrin [ibuprofen], thiazide-type diuretics, and vicodin [hydrocodone-acetaminophen].  MEDICATIONS:  Current Outpatient Medications  Medication Sig Dispense Refill   clopidogrel (PLAVIX) 75 MG tablet Take 1 tablet (75 mg total) by mouth daily. 30 tablet 0   diazepam (VALIUM) 5 MG tablet Take 5 mg by mouth 3 (three) times daily as needed for anxiety.   2   hydrOXYzine (ATARAX/VISTARIL) 10 MG tablet Take 1 tablet (10 mg total) by mouth 3 (three) times daily as needed. 90 tablet 1   lidocaine-prilocaine (EMLA) cream Apply 1 application topically as needed. 30 g 0   oxyCODONE-acetaminophen (PERCOCET) 5-325 MG tablet Take 1 tablet by mouth every 6 (six) hours as needed for severe pain. 30 tablet 0   potassium chloride 20 MEQ/15ML (10%) SOLN Take 15 mLs (20 mEq total) by mouth daily. 105 mL 0   prochlorperazine (COMPAZINE) 10 MG tablet Take 1 tablet (10 mg total) by mouth every 6 (six) hours as needed for nausea or vomiting. 30 tablet 2   traMADol (ULTRAM) 50 MG tablet Take 50 mg by mouth 4 (four) times daily as needed for moderate pain (confirmed with her Pharmacy-Dr K. Little 03/15 she received #120).     No current facility-administered medications for this visit.   Facility-Administered Medications Ordered in Other Visits  Medication Dose Route Frequency Provider Last Rate Last Admin   0.9 %  sodium chloride infusion   Intravenous Continuous Chrisanna Mishra L, PA-C 500 mL/hr at 03/21/21 1042 New Bag at 03/21/21 1042    SURGICAL HISTORY:  Past Surgical History:  Procedure Laterality Date   ABDOMINAL HYSTERECTOMY  1981   APPENDECTOMY     at age 40   EYE SURGERY     left   IR GENERIC HISTORICAL  06/03/2016   IR FLUORO GUIDE PORT INSERTION RIGHT 06/03/2016 [facility]   IR GENERIC HISTORICAL  06/03/2016   IR US GUIDE VASC ACCESS RIGHT 06/03/2016 [facility]   SKIN CANCER EXCISION     basal cell carcinoma L side of face    REVIEW OF SYSTEMS:   Review of Systems   Constitutional: Positive for fatigue, weight loss, and decreased appetite. Negative for chills, fever and unexpected weight change. HENT: Negative for mouth sores, nosebleeds, sore throat and trouble swallowing.   Eyes: Negative for eye problems and icterus. Respiratory: Positive for baseline cough and shortness of breath with exertion. Negative for hemoptysis and wheezing.   Cardiovascular:  Negative for chest pain and leg swelling. Gastrointestinal: Positive for mild nausea. Negative for abdominal pain, constipation, and vomiting. Genitourinary: Negative for bladder incontinence, difficulty urinating, dysuria, frequency and hematuria.   Musculoskeletal: Positive for back pain. Negative for gait problem, neck pain and neck stiffness. Skin: Positive for itching (improved from prior). Negative for rash. Neurological: Lower extremity weakness, gradually worsening L>R due to prior stoke. Negative for dizziness, headaches, light-headedness and seizures. Hematological: Negative for adenopathy. Does not bruise/bleed easily. Psychiatric/Behavioral: Negative  for confusion, depression and sleep disturbance. The patient is not nervous/anxious    PHYSICAL EXAMINATION:  Pulse 97, temperature (!) 95.3 F (35.2 C), temperature source Tympanic, resp. rate 18, weight 110 lb 7 oz (50.1 kg), SpO2 100 %.  ECOG PERFORMANCE STATUS: 3  Physical Exam  Constitutional: Oriented to person, place, and time and chronically ill appearing female and in no distress. HENT: Head: Normocephalic and atraumatic. Mouth/Throat: Oropharynx is clear and moist. No oropharyngeal exudate. Eyes: Conjunctivae are normal. Right eye exhibits no discharge. Left eye exhibits no discharge. No scleral icterus. Neck: Normal range of motion. Neck supple. Cardiovascular: Normal rate, regular rhythm, normal heart sounds and intact distal pulses.   Pulmonary/Chest: Effort normal.  Quiet breath sounds in all lung fields.  No respiratory  distress. No wheezes. No rales. Abdominal: Soft. Bowel sounds are normal. Exhibits no distension and no mass. There is no tenderness.  Musculoskeletal: Normal range of motion. Exhibits no edema.  Lymphadenopathy:    No cervical adenopathy.  Neurological: Alert and oriented to person, place, and time. Exhibits muscle wasting. Examined in the wheelchair.  Skin: Skin is warm and dry. No rash noted. Not diaphoretic. No erythema. No pallor.  Psychiatric: Mood, memory and judgment normal. Vitals reviewed.  LABORATORY DATA: Lab Results  Component Value Date   WBC 7.9 03/21/2021   HGB 11.4 (L) 03/21/2021   HCT 32.8 (L) 03/21/2021   MCV 108.3 (H) 03/21/2021   PLT 301 03/21/2021      Chemistry      Component Value Date/Time   NA 138 03/01/2021 1001   NA 141 08/01/2017 0835   K 2.9 (L) 03/01/2021 1001   K 3.5 08/01/2017 0835   CL 102 03/01/2021 1001   CO2 26 03/01/2021 1001   CO2 25 08/01/2017 0835   BUN 4 (L) 03/01/2021 1001   BUN 8.0 08/01/2017 0835   CREATININE 0.75 03/01/2021 1001   CREATININE 0.9 08/01/2017 0835      Component Value Date/Time   CALCIUM 8.3 (L) 03/01/2021 1001   CALCIUM 9.9 08/01/2017 0835   ALKPHOS 125 03/01/2021 1001   ALKPHOS 142 08/01/2017 0835   AST 18 03/01/2021 1001   AST 15 08/01/2017 0835   ALT 12 03/01/2021 1001   ALT 19 08/01/2017 0835   BILITOT 0.3 03/01/2021 1001   BILITOT 0.35 08/01/2017 0835       RADIOGRAPHIC STUDIES:  No results found.   ASSESSMENT/PLAN:  This is a very pleasant 60 year old Caucasian female with extensive stage small cell lung cancer.  She presented with a right upper lobe lung mass, large right anterior mediastinal and supraclavicular lymphadenopathy as well as a pancreatic and splenic metastasis.  She was diagnosed in September 2017.   The patient underwent systemic chemotherapy with carboplatin and etoposide.  She is status post 6 cycles.  She tolerated treatment well except for chemotherapy-induced anemia which  required  pRBCs and platelet transfusions.  She had a significant improvement of her disease with chemotherapy. The patient then underwent prophylactic cranial irradiation. She then underwent stereotactic radiotherapy to the right upper lobe and pulmonary nodule.   She had been on observation for 2 years before showing evidence of disease progression.  She then was started on systemic chemotherapy with carboplatin, etoposide, and Tecentriq. Starting from cycle #5, she has been on maintenance single agent Tecentriq.  She has been tolerating treatment well. She is status post 46 cycles of  Tecentriq total.  Labs were reviewed. Recommend that she proceed with cycle #47 today  as scheduled.    We will see her for a follow up visit in 3 weeks for evaluation before starting cycle #48.   I had a lengthy discussion today with the patient about her fatigue and decreased appetite.  Regarding her fatigue, the patient has poor nutrition and she is also very sedentary.  The patient's TSH and hemoglobin are acceptable.  I encouraged her to be as active as possible and encouraged her to use weights and/or use her leg peddler machine.  Due to her prior stroke and inability to swim, it likely would be unsafe to perform activities outdoors or in a pool.  She also wondered if she can get B12 injections.  Given her macrocytic anemia, I will arrange for B12 and folate to be drawn off her labs today.  If deficient, we will arrange for B12 injections.  Otherwise, I did let her know that B12 supplements are available over-the-counter.  I have also refilled her potassium today for hypokalemia.  The patient states that she does not drink any water throughout the day.  We will arrange for her to receive extra IV fluids today to help with her fatigue and poor oral intake.  She has some financial concerns which limits her food options.  I encouraged her to drink supplemental drinks such as boost or Ensure.  She does not like the  taste of these, I encouraged her to make her own protein shakes in a blender.  She states that she has a hard time affording gas for transportation let alone for extra food or groceries.  I will refer her to nutrition to see if they have any ideas for strategies in to see if she will qualify for any complementary drinks or coupons.  I will also reach out to the social worker and the financial advocate to see if she qualifies for a gas card.  Discussed that part of her fatigue is likely due to poor nutrition.   The patient was advised to call immediately if she has any concerning symptoms in the interval. The patient voices understanding of current disease status and treatment options and is in agreement with the current care plan. All questions were answered. The patient knows to call the clinic with any problems, questions or concerns. We can certainly see the patient much sooner if necessary    Orders Placed This Encounter  Procedures   Vitamin B12    Standing Status:   Future    Standing Expiration Date:   03/21/2022   Folate    Standing Status:   Future    Standing Expiration Date:   03/21/2022   Ambulatory Referral to [facility] Nutrition    Referral Priority:   Routine    Referral Type:   Consultation    Referral Reason:   Specialty Services Required    Number of Visits Requested:   1     The total time spent in the appointment was 30-39 minutes.   Reeya Bound L Latysha Thackston, PA-C 03/21/21

## 2021-03-21 ENCOUNTER — Other Ambulatory Visit: Payer: Self-pay

## 2021-03-21 ENCOUNTER — Encounter: Payer: Self-pay | Admitting: General Practice

## 2021-03-21 ENCOUNTER — Inpatient Hospital Stay: Payer: Medicare Other | Attending: Physician Assistant | Admitting: Physician Assistant

## 2021-03-21 ENCOUNTER — Inpatient Hospital Stay: Payer: Medicare Other

## 2021-03-21 ENCOUNTER — Telehealth: Payer: Self-pay | Admitting: Physician Assistant

## 2021-03-21 ENCOUNTER — Other Ambulatory Visit: Payer: Self-pay | Admitting: Physician Assistant

## 2021-03-21 VITALS — BP 118/87

## 2021-03-21 VITALS — HR 97 | Temp 95.3°F | Resp 18 | Wt 110.4 lb

## 2021-03-21 DIAGNOSIS — C3491 Malignant neoplasm of unspecified part of right bronchus or lung: Secondary | ICD-10-CM | POA: Diagnosis not present

## 2021-03-21 DIAGNOSIS — E785 Hyperlipidemia, unspecified: Secondary | ICD-10-CM | POA: Diagnosis not present

## 2021-03-21 DIAGNOSIS — Z79899 Other long term (current) drug therapy: Secondary | ICD-10-CM | POA: Insufficient documentation

## 2021-03-21 DIAGNOSIS — E876 Hypokalemia: Secondary | ICD-10-CM | POA: Diagnosis not present

## 2021-03-21 DIAGNOSIS — R630 Anorexia: Secondary | ICD-10-CM

## 2021-03-21 DIAGNOSIS — C3411 Malignant neoplasm of upper lobe, right bronchus or lung: Secondary | ICD-10-CM | POA: Diagnosis not present

## 2021-03-21 DIAGNOSIS — I10 Essential (primary) hypertension: Secondary | ICD-10-CM | POA: Diagnosis not present

## 2021-03-21 DIAGNOSIS — C778 Secondary and unspecified malignant neoplasm of lymph nodes of multiple regions: Secondary | ICD-10-CM | POA: Insufficient documentation

## 2021-03-21 DIAGNOSIS — G894 Chronic pain syndrome: Secondary | ICD-10-CM | POA: Diagnosis not present

## 2021-03-21 DIAGNOSIS — C7889 Secondary malignant neoplasm of other digestive organs: Secondary | ICD-10-CM | POA: Diagnosis not present

## 2021-03-21 DIAGNOSIS — Z85828 Personal history of other malignant neoplasm of skin: Secondary | ICD-10-CM | POA: Insufficient documentation

## 2021-03-21 DIAGNOSIS — R634 Abnormal weight loss: Secondary | ICD-10-CM

## 2021-03-21 DIAGNOSIS — M810 Age-related osteoporosis without current pathological fracture: Secondary | ICD-10-CM | POA: Insufficient documentation

## 2021-03-21 DIAGNOSIS — Z8673 Personal history of transient ischemic attack (TIA), and cerebral infarction without residual deficits: Secondary | ICD-10-CM | POA: Insufficient documentation

## 2021-03-21 DIAGNOSIS — R5383 Other fatigue: Secondary | ICD-10-CM

## 2021-03-21 DIAGNOSIS — Z5112 Encounter for antineoplastic immunotherapy: Secondary | ICD-10-CM

## 2021-03-21 DIAGNOSIS — D539 Nutritional anemia, unspecified: Secondary | ICD-10-CM | POA: Diagnosis not present

## 2021-03-21 DIAGNOSIS — E538 Deficiency of other specified B group vitamins: Secondary | ICD-10-CM

## 2021-03-21 LAB — CBC WITH DIFFERENTIAL (CANCER CENTER ONLY)
Abs Immature Granulocytes: 0.03 10*3/uL (ref 0.00–0.07)
Basophils Absolute: 0 10*3/uL (ref 0.0–0.1)
Basophils Relative: 0 %
Eosinophils Absolute: 0.1 10*3/uL (ref 0.0–0.5)
Eosinophils Relative: 2 %
HCT: 32.8 % — ABNORMAL LOW (ref 36.0–46.0)
Hemoglobin: 11.4 g/dL — ABNORMAL LOW (ref 12.0–15.0)
Immature Granulocytes: 0 %
Lymphocytes Relative: 23 %
Lymphs Abs: 1.9 10*3/uL (ref 0.7–4.0)
MCH: 37.6 pg — ABNORMAL HIGH (ref 26.0–34.0)
MCHC: 34.8 g/dL (ref 30.0–36.0)
MCV: 108.3 fL — ABNORMAL HIGH (ref 80.0–100.0)
Monocytes Absolute: 0.4 10*3/uL (ref 0.1–1.0)
Monocytes Relative: 6 %
Neutro Abs: 5.5 10*3/uL (ref 1.7–7.7)
Neutrophils Relative %: 69 %
Platelet Count: 301 10*3/uL (ref 150–400)
RBC: 3.03 MIL/uL — ABNORMAL LOW (ref 3.87–5.11)
RDW: 13.3 % (ref 11.5–15.5)
WBC Count: 7.9 10*3/uL (ref 4.0–10.5)
nRBC: 0 % (ref 0.0–0.2)

## 2021-03-21 LAB — CMP (CANCER CENTER ONLY)
ALT: 22 U/L (ref 0–44)
AST: 30 U/L (ref 15–41)
Albumin: 3.3 g/dL — ABNORMAL LOW (ref 3.5–5.0)
Alkaline Phosphatase: 115 U/L (ref 38–126)
Anion gap: 13 (ref 5–15)
BUN: 7 mg/dL — ABNORMAL LOW (ref 8–23)
CO2: 27 mmol/L (ref 22–32)
Calcium: 9.1 mg/dL (ref 8.9–10.3)
Chloride: 100 mmol/L (ref 98–111)
Creatinine: 0.82 mg/dL (ref 0.44–1.00)
GFR, Estimated: >60 mL/min (ref >60)
Glucose, Bld: 145 mg/dL — ABNORMAL HIGH (ref 70–99)
Potassium: 3.2 mmol/L — ABNORMAL LOW (ref 3.5–5.1)
Sodium: 140 mmol/L (ref 135–145)
Total Bilirubin: 0.3 mg/dL (ref 0.3–1.2)
Total Protein: 6.4 g/dL — ABNORMAL LOW (ref 6.5–8.1)

## 2021-03-21 LAB — VITAMIN B12: Vitamin B-12: 295 pg/mL (ref 180–914)

## 2021-03-21 LAB — FOLATE: Folate: 1.8 ng/mL — ABNORMAL LOW (ref >5.9)

## 2021-03-21 LAB — TSH: TSH: 1.708 u[IU]/mL (ref 0.308–3.960)

## 2021-03-21 MED ORDER — SODIUM CHLORIDE 0.9 % IV SOLN
1200.0000 mg | Freq: Once | INTRAVENOUS | Status: AC
  Administered 2021-03-21: 1200 mg via INTRAVENOUS
  Filled 2021-03-21: qty 20

## 2021-03-21 MED ORDER — SODIUM CHLORIDE 0.9 % IV SOLN
Freq: Once | INTRAVENOUS | Status: AC
  Filled 2021-03-21: qty 250

## 2021-03-21 MED ORDER — FOLIC ACID 1 MG PO TABS: 1.0000 mg | ORAL_TABLET | Freq: Every day | ORAL | 1 refills | Status: DC

## 2021-03-21 MED ORDER — SODIUM CHLORIDE 0.9 % IV SOLN
INTRAVENOUS | Status: DC
  Filled 2021-03-21 (×3): qty 250

## 2021-03-21 MED ORDER — SODIUM CHLORIDE 0.9% FLUSH
10.0000 mL | INTRAVENOUS | Status: DC | PRN
  Administered 2021-03-21: 10 mL
  Filled 2021-03-21: qty 10

## 2021-03-21 MED ORDER — POTASSIUM CHLORIDE 20 MEQ/15ML (10%) PO SOLN: 20.0000 meq | Freq: Every day | ORAL | 0 refills | Status: DC

## 2021-03-21 MED ORDER — HEPARIN SOD (PORK) LOCK FLUSH 100 UNIT/ML IV SOLN
500.0000 [IU] | Freq: Once | INTRAVENOUS | Status: AC | PRN
  Administered 2021-03-21: 500 [IU]
  Filled 2021-03-21: qty 5

## 2021-03-21 NOTE — Patient Instructions (Signed)
[facility]COL[facility]  Discharge Instructions: Thank you for choosing [facility] to provide your oncology and hematology care.   If you have a lab appointment with the [facility], please go directly to the [facility] and check in at the registration area.   Wear comfortable clothing and clothing appropriate for easy access to any Portacath or PICC line.   We strive to give you quality time with your provider. You may need to reschedule your appointment if you arrive late (15 or more minutes).  Arriving late affects you and other patients whose appointments are after yours.  Also, if you miss three or more appointments without notifying the office, you may be dismissed from the clinic at the provider's discretion.      For prescription refill requests, have your pharmacy contact our office and allow 72 hours for refills to be completed.    Today you received the following chemotherapy and/or immunotherapy agents: Tecentriq.      To help prevent nausea and vomiting after your treatment, we encourage you to take your nausea medication as directed.  BELOW ARE SYMPTOMS THAT SHOULD BE REPORTED IMMEDIATELY: *FEVER GREATER THAN 100.4 F (38 C) OR HIGHER *CHILLS OR SWEATING *NAUSEA AND VOMITING THAT IS NOT CONTROLLED WITH YOUR NAUSEA MEDICATION *UNUSUAL SHORTNESS OF BREATH *UNUSUAL BRUISING OR BLEEDING *URINARY PROBLEMS (pain or burning when urinating, or frequent urination) *BOWEL PROBLEMS (unusual diarrhea, constipation, pain near the anus) TENDERNESS IN MOUTH AND THROAT WITH OR WITHOUT PRESENCE OF ULCERS (sore throat, sores in mouth, or a toothache) UNUSUAL RASH, SWELLING OR PAIN  UNUSUAL VAGINAL DISCHARGE OR ITCHING   Items with * indicate a potential emergency and should be followed up as soon as possible or go to the Emergency Department if any problems should occur.  Please show the CHEMOTHERAPY ALERT CARD or IMMUNOTHERAPY ALERT CARD at check-in to  the Emergency Department and triage nurse.  Should you have questions after your visit or need to cancel or reschedule your appointment, please contact [facility]  Dept: [phone]  and follow the prompts.  Office hours are 8:00 a.m. to 4:30 p.m. Monday - Friday. Please note that voicemails left after 4:00 p.m. may not be returned until the following business day.  We are closed weekends and major holidays. You have access to a nurse at all times for urgent questions. Please call the main number to the clinic Dept: [phone] and follow the prompts.   For any non-urgent questions, you may also contact your provider using MyChart. We now offer e-Visits for anyone 49 and older to request care online for non-urgent symptoms. For details visit [email or web address].   Also download the MyChart app! Go to the app store, search "MyChart", open the app, select [facility], and log in with your MyChart username and password.  Due to Covid, a mask is required upon entering the hospital/clinic. If you do not have a mask, one will be given to you upon arrival. For doctor visits, patients may have 1 support person aged 47 or older with them. For treatment visits, patients cannot have anyone with them due to current Covid guidelines and our immunocompromised population.

## 2021-03-21 NOTE — Telephone Encounter (Signed)
I tried to call the patient to let her know that her folate was very low.  Her B12 is within the low/normal range.  The patient's voicemail is not set up.  Unable to leave any messages on her home telephone number as well.  I did send a prescription for folic acid to her pharmacy as well as potassium for her hypokalemia.

## 2021-03-21 NOTE — Progress Notes (Signed)
[facility]  Initial Assessment   Debra Kidd is a 60 y.o. year old female contacted by phone. Clinical Social Work was referred by medical oncology for assessment of psychosocial needs.   SDOH (Social Determinants of Health) assessments performed: Yes SDOH Interventions    Flowsheet Row Most Recent Value  SDOH Interventions   Housing Interventions Intervention Not Indicated       Distress Screen completed: Yes ONCBCN DISTRESS SCREENING 11/04/2016  Screening Type Initial Screening  Distress experienced in past week (1-10) 4  Emotional problem type Nervousness/Anxiety;Boredom  Physical Problem type Sleep/insomnia;Skin dry/itchy      Family/Social Information:  Housing Arrangement: patient lives with husband Family members/support persons in your life? Family Transportation concerns: no, husband drives, she cannot walk/drive  Employment: . Income source: Customer service manager, Supported by [employer], and husbands income Financial concerns: Yes, due to illness and/or loss of work during treatment Type of concern: Utilities, Film/video editor, and Medical bills Food access concerns: yes, cannot afford nutritional supplements recommended Religious or spiritual practice: no Medication Concerns: yes, difficult to afford these  Services Currently in place:  none  Coping/ Adjustment to diagnosis: Patient understands treatment plan and what happens next? yes, is in maintenance treatment for small cell lung cancer.  States she is in wheelchair, can no longer walk.  Physically exhausted.  Multiple stressors, primarily financial.  Her disability income is limited as her Medicare premium is taken out of her monthly check.  She does not have Part D drug coverage.  She does not feel she can afford to make any changes.  She has tried to get Medicaid but her husband's income puts her over the income limits.  She is frustrated about  Concerns about diagnosis  and/or treatment: How I will pay for the services I need Patient reported stressors: Housing, Transportation, and Feeling hopeless Hopes and priorities: financial stability Current coping skills/ strengths: Capable of independent living    SUMMARY: Current SDOH Barriers:  Financial constraints related to limited income, inability to work herself, fears of losing housing due to financial stress, medical bills she cannot pay, Limited social support, and Housing barriers  Interventions: Discussed common feeling and emotions when being diagnosed with cancer, and the importance of support during treatment Informed patient of the support team roles and support services at [facility] Provided CSW contact information and encouraged patient to call with any questions or concerns Referred patient to financial advocate, referral has been made to dietitian.  Referred to [facility] [employer] [facility] [employer] gas card program for their small grants.     Follow Up Plan: Patient will contact CSW with any support or resource needs Patient verbalizes understanding of plan: Yes    Beverely Pace , [place], LCSW Clinical Social Worker Phone:  [phone]

## 2021-04-06 NOTE — Progress Notes (Signed)
[facility] OFFICE PROGRESS NOTE  [name], MD [address] [place] [address]  DIAGNOSIS: Extensive stage (T2a, N3, [name], large right anterior mediastinal and supraclavicular lymphadenopathy as well as pancreatic and splenic metastasis diagnosed in September 2017.  The patient had disease progression in October 2019.  PRIOR THERAPY: 1) Systemic chemotherapy was carboplatin for AUC of 5 on day 1 and etoposide 100 MG/M2 on days 1, 2 and 3 with Neulasta support. Status post 6 cycles with significant response of her disease. 2) Prophylactic cranial irradiation under the care of Dr. Sondra Come on 12/02/2016. 3) stereotactic radiotherapy to the recurrent right upper lobe pulmonary nodule under the care of Dr. Sondra Come completed November 10, 2017. 4) Retreatment with systemic chemotherapy with carboplatin for AUC of 5 on day 1 and etoposide 100 mg/M2 on days 1, 2 and 3 as well as Tecentriq (Atezolizumab) 1200 mg IV every 3 weeks with Neulasta support.  First dose June 22, 2018 for disease recurrence.  Status post 5 cycles.  Starting from cycle #2 her dose of carboplatin will be reduced to AUC of 4 and etoposide 80 mg/M2 on days 1, 2 and 3 in addition to the regular dose of Tecentriq.  CURRENT THERAPY: Maintenance treatment with single agent Tecentriq 1200 mg IV every 3 weeks.  Status post 47 cycles.  INTERVAL HISTORY: Debra Kidd 60 y.o. female returns to the clinic today for a follow-up visit.  At the patient's last appointment she was endorsing fatigue, financial concerns, and decreased appetite.  Regarding her fatigue, her TSH was within normal limits but her B12 and folate were drawn.  Her B12 was on the low end of normal and her folate was very low at 1.9.  She was started on folic acid supplements which she is had not picked up yet. She has not started taking B12 supplements over-the-counter either.  She met with  the social worker due to her financial concerns and is scheduled to see them again today.   Otherwise she denies any new concerning complaints today. She has been having some ongoing issues with itchy watery eyes for several months. She sometimes takes benadryl reportedly. He denies any recent fever, chills, or night sweats. She gained 2 lbs but she states she hardly eats.  At her last appointment, and a lengthy discussion with the patient about her dietary intake.  She generally eats less than 3 meals a day and she does not eat dinner.  She does not have any supplemental drinks due to them being expensive. She sometimes uses [employer] breakfast essentials.  She also does not drink that much water.  She has an appointment with a nutritionist tomorrow. The patient states she cannot be here for that appointment. I have reached out to them and they will convert this to a telephone visit and the patient is aware of the time.   The patient denies any new nausea, vomiting, or diarrhea. She has had some constipation. She states she has a bowel movement every 3 days or so. She denies any changes with her baseline shortness of breath with exertion.  She denies any hemoptysis, cough, or chest pain.  She occasionally has itching from her immunotherapy but has Atarax if needed.  She denies any headaches. She is here today for evaluation and repeat blood work before starting cycle #48.    MEDICAL HISTORY: Past Medical History:  Diagnosis Date   Basal  cell carcinoma of cheek    L side of face   Blood type, Rh positive    Cancer (HCC)    Chronic pain syndrome    Depression 08/07/2016   Encounter for antineoplastic chemotherapy 07/17/2016   History of external beam radiation therapy 11/19/16-12/02/16   brain 25 Gy in 10 fractions   Hyperlipidemia    Hypertension    Hypertension 08/07/2016   Osteoporosis    Small cell lung cancer ([facility]) dx'd 05/2016    ALLERGIES:  is allergic to codeine, motrin [ibuprofen],  thiazide-type diuretics, and vicodin [hydrocodone-acetaminophen].  MEDICATIONS:  Current Outpatient Medications  Medication Sig Dispense Refill   clopidogrel (PLAVIX) 75 MG tablet Take 1 tablet (75 mg total) by mouth daily. 30 tablet 0   diazepam (VALIUM) 5 MG tablet Take 5 mg by mouth 3 (three) times daily as needed for anxiety.   2   folic acid (FOLVITE) 1 MG tablet Take 1 tablet (1 mg total) by mouth daily. 30 tablet 1   hydrOXYzine (ATARAX/VISTARIL) 10 MG tablet Take 1 tablet (10 mg total) by mouth 3 (three) times daily as needed. 90 tablet 1   lidocaine-prilocaine (EMLA) cream Apply 1 application topically as needed. 30 g 0   oxyCODONE-acetaminophen (PERCOCET) 5-325 MG tablet Take 1 tablet by mouth every 6 (six) hours as needed for severe pain. 30 tablet 0   potassium chloride 20 MEQ/15ML (10%) SOLN Take 15 mLs (20 mEq total) by mouth daily. 105 mL 0   prochlorperazine (COMPAZINE) 10 MG tablet Take 1 tablet (10 mg total) by mouth every 6 (six) hours as needed for nausea or vomiting. 30 tablet 2   traMADol (ULTRAM) 50 MG tablet Take 50 mg by mouth 4 (four) times daily as needed for moderate pain (confirmed with her Pharmacy-Dr K. Little 03/15 she received #120).     No current facility-administered medications for this visit.    SURGICAL HISTORY:  Past Surgical History:  Procedure Laterality Date   ABDOMINAL HYSTERECTOMY  1981   APPENDECTOMY     at age 26   EYE SURGERY     left   IR GENERIC HISTORICAL  06/03/2016   IR FLUORO GUIDE PORT INSERTION RIGHT 06/03/2016 [facility]   IR GENERIC HISTORICAL  06/03/2016   IR US GUIDE VASC ACCESS RIGHT 06/03/2016 [facility]   SKIN CANCER EXCISION     basal cell carcinoma L side of face    REVIEW OF SYSTEMS:   Review of Systems  Constitutional: Positive for fatigue, weight loss, and decreased appetite. Negative for chills and fever. HENT: Negative for mouth sores, nosebleeds, sore throat and trouble swallowing.   Eyes: Positive for  itchy watery eyes. Negative for icterus. Respiratory: Positive for baseline cough and shortness of breath with exertion. Negative for hemoptysis and wheezing.   Cardiovascular:  Negative for chest pain and leg swelling. Gastrointestinal: Positive for constipation. Negative for abdominal pain, and vomiting. Genitourinary: Negative for bladder incontinence, difficulty urinating, dysuria, frequency and hematuria.   Musculoskeletal: Negative for gait problem, neck pain and neck stiffness. Skin: Positive for itching (improved from prior). Negative for rash. Neurological: Lower extremity weakness, gradually worsening L>R due to prior stoke. Negative for dizziness, headaches, light-headedness and seizures. Hematological: Negative for adenopathy. Does not bruise/bleed easily. Psychiatric/Behavioral: Negative for confusion, depression and sleep disturbance. The patient is not nervous/anxious        PHYSICAL EXAMINATION:  Blood pressure 102/76, pulse 90, temperature (!) 97.5 F (36.4 C), temperature source Tympanic, weight 109 lb 11.2  oz (49.8 kg), SpO2 100 %.  ECOG PERFORMANCE STATUS: 2-3  Physical Exam  Constitutional: Oriented to person, place, and time and chronically ill appearing female and in no distress. HENT: Head: Normocephalic and atraumatic. Mouth/Throat: Oropharynx is clear and moist. No oropharyngeal exudate. Eyes: Conjunctivae are normal. Right eye exhibits no discharge. Left eye exhibits no discharge. No scleral icterus. Neck: Normal range of motion. Neck supple. Cardiovascular: Normal rate, regular rhythm, normal heart sounds and intact distal pulses.   Pulmonary/Chest: Effort normal.  Quiet breath sounds in all lung fields.  No respiratory distress. No wheezes. No rales. Abdominal: Soft. Bowel sounds are normal. Exhibits no distension and no mass. There is no tenderness.  Musculoskeletal: Normal range of motion. Exhibits no edema.  Lymphadenopathy:    No cervical adenopathy.   Neurological: Alert and oriented to person, place, and time. Exhibits muscle wasting. Examined in the wheelchair.  Skin: Skin is warm and dry. No rash noted. Not diaphoretic. No erythema. No pallor.  Psychiatric: Mood, memory and judgment normal. Vitals reviewed.  LABORATORY DATA: Lab Results  Component Value Date   WBC 9.4 04/11/2021   HGB 11.5 (L) 04/11/2021   HCT 33.3 (L) 04/11/2021   MCV 109.9 (H) 04/11/2021   PLT 317 04/11/2021      Chemistry      Component Value Date/Time   NA 140 03/21/2021 0939   NA 141 08/01/2017 0835   K 3.2 (L) 03/21/2021 0939   K 3.5 08/01/2017 0835   CL 100 03/21/2021 0939   CO2 27 03/21/2021 0939   CO2 25 08/01/2017 0835   BUN 7 (L) 03/21/2021 0939   BUN 8.0 08/01/2017 0835   CREATININE 0.82 03/21/2021 0939   CREATININE 0.9 08/01/2017 0835      Component Value Date/Time   CALCIUM 9.1 03/21/2021 0939   CALCIUM 9.9 08/01/2017 0835   ALKPHOS 115 03/21/2021 0939   ALKPHOS 142 08/01/2017 0835   AST 30 03/21/2021 0939   AST 15 08/01/2017 0835   ALT 22 03/21/2021 0939   ALT 19 08/01/2017 0835   BILITOT 0.3 03/21/2021 0939   BILITOT 0.35 08/01/2017 0835       RADIOGRAPHIC STUDIES:  No results found.   ASSESSMENT/PLAN:  This is a very pleasant 60 year old Caucasian female with extensive stage small cell lung cancer.  She presented with a right upper lobe lung mass, large right anterior mediastinal and supraclavicular lymphadenopathy as well as a pancreatic and splenic metastasis.  She was diagnosed in September 2017.   The patient underwent systemic chemotherapy with carboplatin and etoposide.  She is status post 6 cycles.  She tolerated treatment well except for chemotherapy-induced anemia which required  pRBCs and platelet transfusions.  She had a significant improvement of her disease with chemotherapy. The patient then underwent prophylactic cranial irradiation. She then underwent stereotactic radiotherapy to the right upper lobe and  pulmonary nodule.   She had been on observation for 2 years before showing evidence of disease progression.  She then was started on systemic chemotherapy with carboplatin, etoposide, and Tecentriq. Starting from cycle #5, she has been on maintenance single agent Tecentriq.  She has been tolerating treatment well. She is status post 47 cycles of  Tecentriq total.  Labs are reviewed.  Recommend that she proceed with cycle #48 today scheduled.  I will arrange for restaging CT scan of the chest, abdomen, and pelvis prior to starting her next cycle of treatment.  We will see her back for follow-up visit in 3 weeks  for evaluation and to review her scan results before starting cycle #49.  The patient states that her pharmacy never called her regarding the folate supplement.  I have refilled this for her.  I also encouraged her to start taking B12 supplements over-the-counter.  I also encouraged her to use eyedrops for her itchy watery eyes and to try taking Claritin, Zyrtec, or Allegra. I also advised her to start taking a stool softener to prevent constipation and a laxative if she does not have a bowel movement every 1-2 days or so more than her normal bowel habits.   I have reached out to the member the nutritionist team to convert her appointment tomorrow to a telephone visit due to her not being able to get to the clinic tomorrow.  She will meet with the social worker today as scheduled.  She received a gas card recently.  She will continue taking her potassium supplement. She states she has more at home and does not need a refill.   The patient was advised to call immediately if she has any concerning symptoms in the interval. The patient voices understanding of current disease status and treatment options and is in agreement with the current care plan. All questions were answered. The patient knows to call the clinic with any problems, questions or concerns. We can certainly see the patient  much sooner if necessary       Orders Placed This Encounter  Procedures   CT Chest W Contrast    Standing Status:   Future    Standing Expiration Date:   04/11/2022    Order Specific Question:   If indicated for the ordered procedure, I authorize the administration of contrast media per Radiology protocol    Answer:   Yes    Order Specific Question:   Preferred imaging location?    Answer:   [facility]   CT Abdomen Pelvis W Contrast    Standing Status:   Future    Standing Expiration Date:   04/11/2022    Order Specific Question:   If indicated for the ordered procedure, I authorize the administration of contrast media per Radiology protocol    Answer:   Yes    Order Specific Question:   Preferred imaging location?    Answer:   [facility]    Order Specific Question:   Is Oral Contrast requested for this exam?    Answer:   Yes, Per Radiology protocol     The total time spent in the appointment was 20-29 minutes  [place], PA-C 04/11/21

## 2021-04-11 ENCOUNTER — Encounter: Payer: Self-pay | Admitting: *Deleted

## 2021-04-11 ENCOUNTER — Inpatient Hospital Stay: Payer: Medicare Other

## 2021-04-11 ENCOUNTER — Inpatient Hospital Stay: Payer: Medicare Other | Admitting: *Deleted

## 2021-04-11 ENCOUNTER — Inpatient Hospital Stay: Payer: Medicare Other | Attending: Internal Medicine | Admitting: Physician Assistant

## 2021-04-11 ENCOUNTER — Other Ambulatory Visit: Payer: Self-pay

## 2021-04-11 VITALS — Resp 18

## 2021-04-11 VITALS — BP 102/76 | HR 90 | Temp 97.5°F | Wt 109.7 lb

## 2021-04-11 DIAGNOSIS — E538 Deficiency of other specified B group vitamins: Secondary | ICD-10-CM | POA: Diagnosis not present

## 2021-04-11 DIAGNOSIS — E785 Hyperlipidemia, unspecified: Secondary | ICD-10-CM | POA: Diagnosis not present

## 2021-04-11 DIAGNOSIS — C3491 Malignant neoplasm of unspecified part of right bronchus or lung: Secondary | ICD-10-CM

## 2021-04-11 DIAGNOSIS — C7889 Secondary malignant neoplasm of other digestive organs: Secondary | ICD-10-CM | POA: Insufficient documentation

## 2021-04-11 DIAGNOSIS — I700 Atherosclerosis of aorta: Secondary | ICD-10-CM | POA: Insufficient documentation

## 2021-04-11 DIAGNOSIS — M810 Age-related osteoporosis without current pathological fracture: Secondary | ICD-10-CM | POA: Insufficient documentation

## 2021-04-11 DIAGNOSIS — Z5112 Encounter for antineoplastic immunotherapy: Secondary | ICD-10-CM | POA: Insufficient documentation

## 2021-04-11 DIAGNOSIS — I10 Essential (primary) hypertension: Secondary | ICD-10-CM | POA: Insufficient documentation

## 2021-04-11 DIAGNOSIS — I2510 Atherosclerotic heart disease of native coronary artery without angina pectoris: Secondary | ICD-10-CM | POA: Diagnosis not present

## 2021-04-11 DIAGNOSIS — G894 Chronic pain syndrome: Secondary | ICD-10-CM | POA: Insufficient documentation

## 2021-04-11 DIAGNOSIS — Z9221 Personal history of antineoplastic chemotherapy: Secondary | ICD-10-CM | POA: Insufficient documentation

## 2021-04-11 DIAGNOSIS — C3411 Malignant neoplasm of upper lobe, right bronchus or lung: Secondary | ICD-10-CM | POA: Insufficient documentation

## 2021-04-11 DIAGNOSIS — Z85828 Personal history of other malignant neoplasm of skin: Secondary | ICD-10-CM | POA: Diagnosis not present

## 2021-04-11 DIAGNOSIS — Z923 Personal history of irradiation: Secondary | ICD-10-CM | POA: Diagnosis not present

## 2021-04-11 DIAGNOSIS — Z79899 Other long term (current) drug therapy: Secondary | ICD-10-CM | POA: Insufficient documentation

## 2021-04-11 DIAGNOSIS — R5383 Other fatigue: Secondary | ICD-10-CM

## 2021-04-11 LAB — CMP (CANCER CENTER ONLY)
ALT: 18 U/L (ref 0–44)
AST: 22 U/L (ref 15–41)
Albumin: 3.1 g/dL — ABNORMAL LOW (ref 3.5–5.0)
Alkaline Phosphatase: 128 U/L — ABNORMAL HIGH (ref 38–126)
Anion gap: 11 (ref 5–15)
BUN: 5 mg/dL — ABNORMAL LOW (ref 8–23)
CO2: 24 mmol/L (ref 22–32)
Calcium: 8.7 mg/dL — ABNORMAL LOW (ref 8.9–10.3)
Chloride: 103 mmol/L (ref 98–111)
Creatinine: 0.73 mg/dL (ref 0.44–1.00)
GFR, Estimated: >60 mL/min (ref >60)
Glucose, Bld: 109 mg/dL — ABNORMAL HIGH (ref 70–99)
Potassium: 3.3 mmol/L — ABNORMAL LOW (ref 3.5–5.1)
Sodium: 138 mmol/L (ref 135–145)
Total Bilirubin: 0.2 mg/dL — ABNORMAL LOW (ref 0.3–1.2)
Total Protein: 6.1 g/dL — ABNORMAL LOW (ref 6.5–8.1)

## 2021-04-11 LAB — CBC WITH DIFFERENTIAL (CANCER CENTER ONLY)
Abs Immature Granulocytes: 0.03 10*3/uL (ref 0.00–0.07)
Basophils Absolute: 0 10*3/uL (ref 0.0–0.1)
Basophils Relative: 0 %
Eosinophils Absolute: 0.2 10*3/uL (ref 0.0–0.5)
Eosinophils Relative: 2 %
HCT: 33.3 % — ABNORMAL LOW (ref 36.0–46.0)
Hemoglobin: 11.5 g/dL — ABNORMAL LOW (ref 12.0–15.0)
Immature Granulocytes: 0 %
Lymphocytes Relative: 27 %
Lymphs Abs: 2.5 10*3/uL (ref 0.7–4.0)
MCH: 38 pg — ABNORMAL HIGH (ref 26.0–34.0)
MCHC: 34.5 g/dL (ref 30.0–36.0)
MCV: 109.9 fL — ABNORMAL HIGH (ref 80.0–100.0)
Monocytes Absolute: 0.5 10*3/uL (ref 0.1–1.0)
Monocytes Relative: 5 %
Neutro Abs: 6.2 10*3/uL (ref 1.7–7.7)
Neutrophils Relative %: 66 %
Platelet Count: 317 10*3/uL (ref 150–400)
RBC: 3.03 MIL/uL — ABNORMAL LOW (ref 3.87–5.11)
RDW: 13.6 % (ref 11.5–15.5)
WBC Count: 9.4 10*3/uL (ref 4.0–10.5)
nRBC: 0 % (ref 0.0–0.2)

## 2021-04-11 LAB — TSH: TSH: 1.882 u[IU]/mL (ref 0.308–3.960)

## 2021-04-11 MED ORDER — SODIUM CHLORIDE 0.9% FLUSH
10.0000 mL | INTRAVENOUS | Status: DC | PRN
  Administered 2021-04-11: 10 mL
  Filled 2021-04-11: qty 10

## 2021-04-11 MED ORDER — SODIUM CHLORIDE 0.9 % IV SOLN
Freq: Once | INTRAVENOUS | Status: AC
  Filled 2021-04-11: qty 250

## 2021-04-11 MED ORDER — SODIUM CHLORIDE 0.9 % IV SOLN
1200.0000 mg | Freq: Once | INTRAVENOUS | Status: AC
  Administered 2021-04-11: 1200 mg via INTRAVENOUS
  Filled 2021-04-11: qty 20

## 2021-04-11 MED ORDER — FOLIC ACID 1 MG PO TABS: 1.0000 mg | ORAL_TABLET | Freq: Every day | ORAL | 1 refills | Status: DC

## 2021-04-11 MED ORDER — HEPARIN SOD (PORK) LOCK FLUSH 100 UNIT/ML IV SOLN
500.0000 [IU] | Freq: Once | INTRAVENOUS | Status: AC | PRN
  Administered 2021-04-11: 500 [IU]
  Filled 2021-04-11: qty 5

## 2021-04-11 NOTE — Patient Instructions (Signed)
[facility]  Discharge Instructions: Thank you for choosing [facility] to provide your oncology and hematology care.   If you have a lab appointment with the [facility], please go directly to the [facility] and check in at the registration area.   Wear comfortable clothing and clothing appropriate for easy access to any Portacath or PICC line.   We strive to give you quality time with your provider. You may need to reschedule your appointment if you arrive late (15 or more minutes).  Arriving late affects you and other patients whose appointments are after yours.  Also, if you miss three or more appointments without notifying the office, you may be dismissed from the clinic at the provider's discretion.      For prescription refill requests, have your pharmacy contact our office and allow 72 hours for refills to be completed.    Today you received the following chemotherapy and/or immunotherapy agents tecentriq      To help prevent nausea and vomiting after your treatment, we encourage you to take your nausea medication as directed.  BELOW ARE SYMPTOMS THAT SHOULD BE REPORTED IMMEDIATELY: *FEVER GREATER THAN 100.4 F (38 C) OR HIGHER *CHILLS OR SWEATING *NAUSEA AND VOMITING THAT IS NOT CONTROLLED WITH YOUR NAUSEA MEDICATION *UNUSUAL SHORTNESS OF BREATH *UNUSUAL BRUISING OR BLEEDING *URINARY PROBLEMS (pain or burning when urinating, or frequent urination) *BOWEL PROBLEMS (unusual diarrhea, constipation, pain near the anus) TENDERNESS IN MOUTH AND THROAT WITH OR WITHOUT PRESENCE OF ULCERS (sore throat, sores in mouth, or a toothache) UNUSUAL RASH, SWELLING OR PAIN  UNUSUAL VAGINAL DISCHARGE OR ITCHING   Items with * indicate a potential emergency and should be followed up as soon as possible or go to the Emergency Department if any problems should occur.  Please show the CHEMOTHERAPY ALERT CARD or IMMUNOTHERAPY ALERT CARD at check-in to  the Emergency Department and triage nurse.  Should you have questions after your visit or need to cancel or reschedule your appointment, please contact [facility]  Dept: [phone]  and follow the prompts.  Office hours are 8:00 a.m. to 4:30 p.m. Monday - Friday. Please note that voicemails left after 4:00 p.m. may not be returned until the following business day.  We are closed weekends and major holidays. You have access to a nurse at all times for urgent questions. Please call the main number to the clinic Dept: [phone] and follow the prompts.   For any non-urgent questions, you may also contact your provider using MyChart. We now offer e-Visits for anyone 62 and older to request care online for non-urgent symptoms. For details visit [email or web address].   Also download the MyChart app! Go to the app store, search "MyChart", open the app, select [facility], and log in with your MyChart username and password.  Due to Covid, a mask is required upon entering the hospital/clinic. If you do not have a mask, one will be given to you upon arrival. For doctor visits, patients may have 1 support person aged 20 or older with them. For treatment visits, patients cannot have anyone with them due to current Covid guidelines and our immunocompromised population.

## 2021-04-11 NOTE — Progress Notes (Signed)
[facility] Work  Clinical Social Work met with patient in the infusion room to offer support and review resources.  Patient stated she received a gas card from [facility].  CSW and patient discussed co-pay assistance through [facility].  CSW provided patient with contact information and instructions for how to apply for assistance.  Patient stated she would call [facility] today.  CSW and patient discussed the [employer].  CSW enrolled patient and provide first [employer].  Patient was appreciative of CSW support.  CSW provided contact information and encouraged patient to call with questions or concerns.   Johnnye Lana, MSW, LCSW, OSW-C Clinical Social Worker [facility] [phone]

## 2021-04-12 ENCOUNTER — Telehealth: Payer: Self-pay | Admitting: Nutrition

## 2021-04-12 ENCOUNTER — Inpatient Hospital Stay: Payer: Medicare Other | Admitting: Nutrition

## 2021-04-12 NOTE — Telephone Encounter (Signed)
Nutrition consult completed with patient over the telephone at her choice.  60 year old female diagnosed with extensive stage small cell lung cancer.  She is followed by Dr. Julien Nordmann.  She is receiving Tecentriq IV every 3 weeks.  Past medical history includes depression, hyperlipidemia, hypertension, osteoporosis.  Medications include Folvite and Compazine.  Labs include: Potassium 3.3, glucose 109, albumin 3.1.  Height: 5 feet 3 inches. Weight: 109.7 pounds on August 10. Usual body weight: 121 pounds April 2022. BMI: 19.43.  Patient denies nausea and vomiting.  She also denies constipation or diarrhea.  She is edentulous.  Reports increased fatigue/no energy.  She is sedentary at home.  She reports very poor appetite.  States typically eating 1 meal a day.  Drinks 7 cans of Pepsi a day.  Occasionally will eat potato chips, applesauce, V8 juice, whole milk, soup with crackers, peanut butter, and cottage cheese.  She does not like water.  Occasionally she will drink a Carnation breakfast essential.  Nutrition diagnosis: Unintended weight loss related to inadequate oral intake as evidenced by 9% weight loss over 4 months.  Intervention: Educated patient on importance of increasing calories and protein in small amounts throughout the day. Educated patient to consume [employer] essentials made with whole milk at least once daily.  Encouraged bites and sips throughout the day. Educated that adequate calorie intake can improve fatigue. Encouraged increased fluid intake especially with water or lemonade.  Encouraged other liquids with calories and protein. Will mail nutrition fact sheets to patient's home address. Questions were answered.  Teach back method used.  Contact information provided.  Monitoring, evaluation, goals: Patient will work to increase calories and protein in small amounts throughout the day.  Encouraged weight maintenance/weight gain.  Next visit: Wednesday, August  31 during infusion.  **Disclaimer: This note was dictated with voice recognition software. Similar sounding words can inadvertently be transcribed and this note may contain transcription errors which may not have been corrected upon publication of note.**

## 2021-05-01 ENCOUNTER — Other Ambulatory Visit: Payer: Self-pay

## 2021-05-01 ENCOUNTER — Ambulatory Visit (HOSPITAL_COMMUNITY)
Admission: RE | Admit: 2021-05-01 | Discharge: 2021-05-01 | Disposition: A | Discharge status: Home / Self Care | Payer: Medicare Other | Source: Ambulatory Visit | Attending: Physician Assistant | Admitting: Physician Assistant

## 2021-05-01 DIAGNOSIS — C3491 Malignant neoplasm of unspecified part of right bronchus or lung: Secondary | ICD-10-CM | POA: Diagnosis present

## 2021-05-01 MED ORDER — HEPARIN SOD (PORK) LOCK FLUSH 100 UNIT/ML IV SOLN
INTRAVENOUS | Status: AC
  Administered 2021-05-01: 500 [IU]
  Filled 2021-05-01: qty 5

## 2021-05-01 MED ORDER — IOHEXOL 350 MG/ML SOLN
80.0000 mL | Freq: Once | INTRAVENOUS | Status: AC | PRN
  Administered 2021-05-01: 80 mL via INTRAVENOUS

## 2021-05-02 ENCOUNTER — Encounter: Payer: Self-pay | Admitting: Internal Medicine

## 2021-05-02 ENCOUNTER — Ambulatory Visit: Payer: Self-pay

## 2021-05-02 ENCOUNTER — Inpatient Hospital Stay: Payer: Medicare Other

## 2021-05-02 ENCOUNTER — Inpatient Hospital Stay (HOSPITAL_BASED_OUTPATIENT_CLINIC_OR_DEPARTMENT_OTHER): Payer: Medicare Other | Admitting: Internal Medicine

## 2021-05-02 ENCOUNTER — Inpatient Hospital Stay: Payer: Medicare Other | Admitting: Dietician

## 2021-05-02 VITALS — BP 111/73 | HR 90 | Temp 96.9°F | Resp 19 | Ht 63.0 in | Wt 110.9 lb

## 2021-05-02 DIAGNOSIS — R5383 Other fatigue: Secondary | ICD-10-CM

## 2021-05-02 DIAGNOSIS — C3491 Malignant neoplasm of unspecified part of right bronchus or lung: Secondary | ICD-10-CM

## 2021-05-02 DIAGNOSIS — C3411 Malignant neoplasm of upper lobe, right bronchus or lung: Secondary | ICD-10-CM | POA: Diagnosis not present

## 2021-05-02 DIAGNOSIS — Z5112 Encounter for antineoplastic immunotherapy: Secondary | ICD-10-CM

## 2021-05-02 LAB — CMP (CANCER CENTER ONLY)
ALT: 14 U/L (ref 0–44)
AST: 18 U/L (ref 15–41)
Albumin: 3.1 g/dL — ABNORMAL LOW (ref 3.5–5.0)
Alkaline Phosphatase: 127 U/L — ABNORMAL HIGH (ref 38–126)
Anion gap: 11 (ref 5–15)
BUN: <4 mg/dL — ABNORMAL LOW (ref 8–23)
CO2: 25 mmol/L (ref 22–32)
Calcium: 8.8 mg/dL — ABNORMAL LOW (ref 8.9–10.3)
Chloride: 104 mmol/L (ref 98–111)
Creatinine: 0.71 mg/dL (ref 0.44–1.00)
GFR, Estimated: >60 mL/min (ref >60)
Glucose, Bld: 122 mg/dL — ABNORMAL HIGH (ref 70–99)
Potassium: 3.4 mmol/L — ABNORMAL LOW (ref 3.5–5.1)
Sodium: 140 mmol/L (ref 135–145)
Total Bilirubin: 0.4 mg/dL (ref 0.3–1.2)
Total Protein: 6.3 g/dL — ABNORMAL LOW (ref 6.5–8.1)

## 2021-05-02 LAB — CBC WITH DIFFERENTIAL (CANCER CENTER ONLY)
Abs Immature Granulocytes: 0.01 10*3/uL (ref 0.00–0.07)
Basophils Absolute: 0 10*3/uL (ref 0.0–0.1)
Basophils Relative: 0 %
Eosinophils Absolute: 0.1 10*3/uL (ref 0.0–0.5)
Eosinophils Relative: 2 %
HCT: 33.5 % — ABNORMAL LOW (ref 36.0–46.0)
Hemoglobin: 11.3 g/dL — ABNORMAL LOW (ref 12.0–15.0)
Immature Granulocytes: 0 %
Lymphocytes Relative: 25 %
Lymphs Abs: 1.7 10*3/uL (ref 0.7–4.0)
MCH: 36.9 pg — ABNORMAL HIGH (ref 26.0–34.0)
MCHC: 33.7 g/dL (ref 30.0–36.0)
MCV: 109.5 fL — ABNORMAL HIGH (ref 80.0–100.0)
Monocytes Absolute: 0.4 10*3/uL (ref 0.1–1.0)
Monocytes Relative: 6 %
Neutro Abs: 4.5 10*3/uL (ref 1.7–7.7)
Neutrophils Relative %: 67 %
Platelet Count: 286 10*3/uL (ref 150–400)
RBC: 3.06 MIL/uL — ABNORMAL LOW (ref 3.87–5.11)
RDW: 12 % (ref 11.5–15.5)
WBC Count: 6.8 10*3/uL (ref 4.0–10.5)
nRBC: 0 % (ref 0.0–0.2)

## 2021-05-02 LAB — TSH: TSH: 1.425 u[IU]/mL (ref 0.308–3.960)

## 2021-05-02 MED ORDER — HEPARIN SOD (PORK) LOCK FLUSH 100 UNIT/ML IV SOLN
500.0000 [IU] | Freq: Once | INTRAVENOUS | Status: AC | PRN
  Administered 2021-05-02: 500 [IU]

## 2021-05-02 MED ORDER — OXYCODONE-ACETAMINOPHEN 5-325 MG PO TABS: 1.0000 | ORAL_TABLET | Freq: Four times a day (QID) | ORAL | 0 refills | Status: DC | PRN

## 2021-05-02 MED ORDER — SODIUM CHLORIDE 0.9 % IV SOLN
1200.0000 mg | Freq: Once | INTRAVENOUS | Status: AC
  Administered 2021-05-02: 1200 mg via INTRAVENOUS
  Filled 2021-05-02: qty 20

## 2021-05-02 MED ORDER — SODIUM CHLORIDE 0.9% FLUSH
10.0000 mL | INTRAVENOUS | Status: DC | PRN
  Administered 2021-05-02: 10 mL

## 2021-05-02 MED ORDER — SODIUM CHLORIDE 0.9 % IV SOLN: Freq: Once | INTRAVENOUS | Status: AC

## 2021-05-02 NOTE — Telephone Encounter (Signed)
Pt called regarding a very small amount of spotting/ vaginal discharge that she described as dark blood beginning today. Pt currently has no pain, but did have mild pain of 1/10 earlier.  Pt had a hysterectomy in 1980. Pt had immuno therapy yesterday and a CT today. She is being treated for Ca.   Per protocol pt was directed to see her Pcp within 2 weeks, and that if pain or bleeding increases she should seek medical help sooner.   Pt understands and agrees with plan.

## 2021-05-02 NOTE — Progress Notes (Signed)
[facility] [phone]   [phone]  OFFICE PROGRESS NOTE  [name], MD [address] [place] [address]  DIAGNOSIS: Extensive stage (T2a, N3, [name], large right anterior mediastinal and supraclavicular lymphadenopathy as well as pancreatic and splenic metastasis diagnosed in September 2017.  The patient had disease progression in October 2019.  PRIOR THERAPY:  1) Systemic chemotherapy was carboplatin for AUC of 5 on day 1 and etoposide 100 MG/M2 on days 1, 2 and 3 with Neulasta support. Status post 6 cycles with significant response of her disease. 2) Prophylactic cranial irradiation under the care of Dr. Sondra Come on 12/02/2016. 3) stereotactic radiotherapy to the recurrent right upper lobe pulmonary nodule under the care of Dr. Sondra Come completed November 10, 2017. 4) Retreatment with systemic chemotherapy with carboplatin for AUC of 5 on day 1 and etoposide 100 mg/M2 on days 1, 2 and 3 as well as Tecentriq (Atezolizumab) 1200 mg IV every 3 weeks with Neulasta support.  First dose June 22, 2018 for disease recurrence.  Status post 5 cycles.  Starting from cycle #2 her dose of carboplatin will be reduced to AUC of 4 and etoposide 80 mg/M2 on days 1, 2 and 3 in addition to the regular dose of Tecentriq.  CURRENT THERAPY: Maintenance treatment with single agent Tecentriq 1200 mg IV every 3 weeks.  Status post 43 cycles.  INTERVAL HISTORY: Debra Kidd 60 y.o. female returns to the clinic today for follow-up visit.  The patient is feeling fine today with no concerning complaints except for mild itching and she using her Atarax and hydrocortisone cream.  She continues to have low back pain and she is requesting refill of Percocet.  She denied having any chest pain but has shortness of breath with exertion with no cough or hemoptysis.  She denied having any weight loss or night sweats.  She  has no nausea, vomiting, diarrhea or constipation.  She has no headache or visual changes.  She continues to tolerate her treatment with Tecentriq fairly well.  The patient is here today for evaluation before starting cycle #49 of her treatment with repeat CT scan of the chest, abdomen pelvis for restaging of her disease.   MEDICAL HISTORY: Past Medical History:  Diagnosis Date   Basal cell carcinoma of cheek    L side of face   Blood type, Rh positive    Cancer (HCC)    Chronic pain syndrome    Depression 08/07/2016   Encounter for antineoplastic chemotherapy 07/17/2016   History of external beam radiation therapy 11/19/16-12/02/16   brain 25 Gy in 10 fractions   Hyperlipidemia    Hypertension    Hypertension 08/07/2016   Osteoporosis    Small cell lung cancer ([facility]) dx'd 05/2016    ALLERGIES:  is allergic to codeine, motrin [ibuprofen], thiazide-type diuretics, and vicodin [hydrocodone-acetaminophen].  MEDICATIONS:  Current Outpatient Medications  Medication Sig Dispense Refill   clopidogrel (PLAVIX) 75 MG tablet Take 1 tablet (75 mg total) by mouth daily. 30 tablet 0   diazepam (VALIUM) 5 MG tablet Take 5 mg by mouth 3 (three) times daily as needed for anxiety.   2   folic acid (FOLVITE) 1 MG tablet Take 1 tablet (1 mg total) by mouth daily. 30 tablet 1   hydrOXYzine (ATARAX/VISTARIL) 10 MG tablet Take 1 tablet (10 mg total) by mouth 3 (three) times daily as needed.  90 tablet 1   lidocaine-prilocaine (EMLA) cream Apply 1 application topically as needed. 30 g 0   oxyCODONE-acetaminophen (PERCOCET) 5-325 MG tablet Take 1 tablet by mouth every 6 (six) hours as needed for severe pain. 30 tablet 0   potassium chloride 20 MEQ/15ML (10%) SOLN Take 15 mLs (20 mEq total) by mouth daily. 105 mL 0   prochlorperazine (COMPAZINE) 10 MG tablet Take 1 tablet (10 mg total) by mouth every 6 (six) hours as needed for nausea or vomiting. 30 tablet 2   traMADol (ULTRAM) 50 MG tablet Take 50 mg by mouth 4  (four) times daily as needed for moderate pain (confirmed with her Pharmacy-Dr K. Little 03/15 she received #120).     No current facility-administered medications for this visit.    SURGICAL HISTORY:  Past Surgical History:  Procedure Laterality Date   ABDOMINAL HYSTERECTOMY  1981   APPENDECTOMY     at age 31   EYE SURGERY     left   IR GENERIC HISTORICAL  06/03/2016   IR FLUORO GUIDE PORT INSERTION RIGHT 06/03/2016 [facility]   IR GENERIC HISTORICAL  06/03/2016   IR US GUIDE VASC ACCESS RIGHT 06/03/2016 [facility]   SKIN CANCER EXCISION     basal cell carcinoma L side of face    REVIEW OF SYSTEMS:  Constitutional: positive for fatigue Eyes: negative Ears, nose, mouth, throat, and face: negative Respiratory: positive for dyspnea on exertion Cardiovascular: negative Gastrointestinal: negative Genitourinary:negative Integument/breast: positive for pruritus Hematologic/lymphatic: negative Musculoskeletal:positive for back pain Neurological: negative Behavioral/Psych: negative Endocrine: negative Allergic/Immunologic: negative   PHYSICAL EXAMINATION: General appearance: alert, cooperative, fatigued, and no distress Head: Normocephalic, without obvious abnormality, atraumatic Neck: no adenopathy, no JVD, supple, symmetrical, trachea midline, and thyroid not enlarged, symmetric, no tenderness/mass/nodules Lymph nodes: Cervical, supraclavicular, and axillary nodes normal. Resp: clear to auscultation bilaterally Back: symmetric, no curvature. ROM normal. No CVA tenderness. Cardio: regular rate and rhythm, S1, S2 normal, no murmur, click, rub or gallop GI: soft, non-tender; bowel sounds normal; no masses,  no organomegaly Extremities: extremities normal, atraumatic, no cyanosis or edema Neurologic: Alert and oriented X 3, normal strength and tone. Normal symmetric reflexes. Normal coordination and gait  ECOG PERFORMANCE STATUS: 1 - Symptomatic but completely  ambulatory  Blood pressure 111/73, pulse 90, temperature (!) 96.9 F (36.1 C), temperature source Tympanic, resp. rate 19, height 5\' 3"  (1.6 m), weight 110 lb 14.4 oz (50.3 kg), SpO2 100 %.  LABORATORY DATA: Lab Results  Component Value Date   WBC 6.8 05/02/2021   HGB 11.3 (L) 05/02/2021   HCT 33.5 (L) 05/02/2021   MCV 109.5 (H) 05/02/2021   PLT 286 05/02/2021      Chemistry      Component Value Date/Time   NA 138 04/11/2021 0909   NA 141 08/01/2017 0835   K 3.3 (L) 04/11/2021 0909   K 3.5 08/01/2017 0835   CL 103 04/11/2021 0909   CO2 24 04/11/2021 0909   CO2 25 08/01/2017 0835   BUN 5 (L) 04/11/2021 0909   BUN 8.0 08/01/2017 0835   CREATININE 0.73 04/11/2021 0909   CREATININE 0.9 08/01/2017 0835      Component Value Date/Time   CALCIUM 8.7 (L) 04/11/2021 0909   CALCIUM 9.9 08/01/2017 0835   ALKPHOS 128 (H) 04/11/2021 0909   ALKPHOS 142 08/01/2017 0835   AST 22 04/11/2021 0909   AST 15 08/01/2017 0835   ALT 18 04/11/2021 0909   ALT 19 08/01/2017 0835  BILITOT 0.2 (L) 04/11/2021 0909   BILITOT 0.35 08/01/2017 0835       RADIOGRAPHIC STUDIES: CT Chest W Contrast  Result Date: 05/01/2021 CLINICAL DATA:  Primary Cancer Type: Lung Imaging Indication: Assess response to therapy Interval therapy since last imaging? Yes Initial Cancer Diagnosis Date: 05/21/2016; Established by: Biopsy-proven Detailed Pathology: Extensive stage small cell lung cancer. Primary Tumor location: Right upper lobe. Pancreatic and splenic metastases. Surgeries: Hysterectomy.  Appendectomy. Chemotherapy: Yes; Ongoing?  No; Most recent administration: 2019 Immunotherapy?  Yes; Type: Tecentriq; Ongoing? Yes Radiation therapy? Yes Date Range: 11/04/2017 - 11/10/2017; Target: Right lung Date Range: 11/19/2016 - 12/02/2016; Target: prophylactic cranial irradiation EXAM: CT CHEST, ABDOMEN, AND PELVIS WITH CONTRAST TECHNIQUE: Multidetector CT imaging of the chest, abdomen and pelvis was performed following  the standard protocol during bolus administration of intravenous contrast. CONTRAST:  60mL OMNIPAQUE IOHEXOL 350 MG/ML SOLN COMPARISON:  Most recent CT chest, abdomen and pelvis 02/05/2021. FINDINGS: CT CHEST FINDINGS Cardiovascular: Right chest port catheter. Aortic atherosclerosis. Normal heart size. Left and right coronary artery calcifications. No pericardial effusion. Mediastinum/Nodes: No enlarged mediastinal, hilar, or axillary lymph nodes. Thyroid gland, trachea, and esophagus demonstrate no significant findings. Lungs/Pleura: Unchanged appearance of a spiculated pleural based mass of the peripheral right apex, measuring 3.7 x 2.4 cm (series 4, image 25). Unchanged background of very fine centrilobular pulmonary nodules. No pleural effusion or pneumothorax. Musculoskeletal: No chest wall mass or suspicious bone lesions identified. Unchanged subtle wedge deformities of T7 through T9. CT ABDOMEN PELVIS FINDINGS Hepatobiliary: No solid liver abnormality is seen. No gallstones, gallbladder wall thickening, or biliary dilatation. Pancreas: Unremarkable. No pancreatic ductal dilatation or surrounding inflammatory changes. Spleen: Normal in size without significant abnormality. Adrenals/Urinary Tract: Adrenal glands are unremarkable. Kidneys are normal, without renal calculi, solid lesion, or hydronephrosis. Bladder is unremarkable. Stomach/Bowel: Stomach is within normal limits. Appendix is not clearly visualized and may be surgically absent. No evidence of bowel wall thickening, distention, or inflammatory changes. Vascular/Lymphatic: Aortic atherosclerosis. No enlarged abdominal or pelvic lymph nodes. Reproductive: Status post hysterectomy. Other: No abdominal wall hernia or abnormality. No abdominopelvic ascites. Musculoskeletal: No acute or significant osseous findings. Unchanged superior endplate deformity of L2. IMPRESSION: 1. Unchanged post treatment appearance of a spiculated pleural based mass of the  peripheral right apex. 2. No evidence of recurrent or metastatic disease in the chest, abdomen, or pelvis. 3. Unchanged background of very fine centrilobular pulmonary nodules, most commonly seen in smoking-related respiratory bronchiolitis. 4. Coronary artery disease. Aortic Atherosclerosis (ICD10-I70.0). Electronically Signed   By: Eddie Candle M.D.   On: 05/01/2021 11:57   CT Abdomen Pelvis W Contrast  Result Date: 05/01/2021 CLINICAL DATA:  Primary Cancer Type: Lung Imaging Indication: Assess response to therapy Interval therapy since last imaging? Yes Initial Cancer Diagnosis Date: 05/21/2016; Established by: Biopsy-proven Detailed Pathology: Extensive stage small cell lung cancer. Primary Tumor location: Right upper lobe. Pancreatic and splenic metastases. Surgeries: Hysterectomy.  Appendectomy. Chemotherapy: Yes; Ongoing?  No; Most recent administration: 2019 Immunotherapy?  Yes; Type: Tecentriq; Ongoing? Yes Radiation therapy? Yes Date Range: 11/04/2017 - 11/10/2017; Target: Right lung Date Range: 11/19/2016 - 12/02/2016; Target: prophylactic cranial irradiation EXAM: CT CHEST, ABDOMEN, AND PELVIS WITH CONTRAST TECHNIQUE: Multidetector CT imaging of the chest, abdomen and pelvis was performed following the standard protocol during bolus administration of intravenous contrast. CONTRAST:  65mL OMNIPAQUE IOHEXOL 350 MG/ML SOLN COMPARISON:  Most recent CT chest, abdomen and pelvis 02/05/2021. FINDINGS: CT CHEST FINDINGS Cardiovascular: Right chest port catheter. Aortic atherosclerosis. Normal heart  size. Left and right coronary artery calcifications. No pericardial effusion. Mediastinum/Nodes: No enlarged mediastinal, hilar, or axillary lymph nodes. Thyroid gland, trachea, and esophagus demonstrate no significant findings. Lungs/Pleura: Unchanged appearance of a spiculated pleural based mass of the peripheral right apex, measuring 3.7 x 2.4 cm (series 4, image 25). Unchanged background of very fine  centrilobular pulmonary nodules. No pleural effusion or pneumothorax. Musculoskeletal: No chest wall mass or suspicious bone lesions identified. Unchanged subtle wedge deformities of T7 through T9. CT ABDOMEN PELVIS FINDINGS Hepatobiliary: No solid liver abnormality is seen. No gallstones, gallbladder wall thickening, or biliary dilatation. Pancreas: Unremarkable. No pancreatic ductal dilatation or surrounding inflammatory changes. Spleen: Normal in size without significant abnormality. Adrenals/Urinary Tract: Adrenal glands are unremarkable. Kidneys are normal, without renal calculi, solid lesion, or hydronephrosis. Bladder is unremarkable. Stomach/Bowel: Stomach is within normal limits. Appendix is not clearly visualized and may be surgically absent. No evidence of bowel wall thickening, distention, or inflammatory changes. Vascular/Lymphatic: Aortic atherosclerosis. No enlarged abdominal or pelvic lymph nodes. Reproductive: Status post hysterectomy. Other: No abdominal wall hernia or abnormality. No abdominopelvic ascites. Musculoskeletal: No acute or significant osseous findings. Unchanged superior endplate deformity of L2. IMPRESSION: 1. Unchanged post treatment appearance of a spiculated pleural based mass of the peripheral right apex. 2. No evidence of recurrent or metastatic disease in the chest, abdomen, or pelvis. 3. Unchanged background of very fine centrilobular pulmonary nodules, most commonly seen in smoking-related respiratory bronchiolitis. 4. Coronary artery disease. Aortic Atherosclerosis (ICD10-I70.0). Electronically Signed   By: Eddie Candle M.D.   On: 05/01/2021 11:57    ASSESSMENT AND PLAN:  This is a very pleasant 60 years old white female with extensive stage small cell lung cancer status post systemic chemotherapy with carbo platinum and etoposide for 6 cycles and the patient rotated her treatment well except for chemotherapy-induced anemia and requirement for PRBCs and platelet  transfusion. She had significant improvement in her disease with the chemotherapy. She also had prophylactic cranial irradiation. She also underwent stereotactic radiotherapy to progressive right upper lobe pulmonary nodule. The patient has been in observation for close to 2 years. Repeat CT scan of the chest, abdomen and pelvis performed recently showed evidence for disease progression in the abdomen. The patient was started on systemic chemotherapy again with carboplatin, etoposide and Tecentriq status post 48 cycles.  Starting from cycle #5 the patient is treated with maintenance single agent Tecentriq. The patient continues to tolerate her treatment with Tecentriq fairly well with no concerning adverse effect except for the pruritus. She had repeat CT scan of the chest, abdomen pelvis performed recently.  I personally and independently reviewed the scans and discussed the results with the patient today. Her scan showed no concerning findings for disease progression. I recommended for her to continue her current treatment with Tecentriq and she will proceed with cycle #49 today. For the chronic back pain, I will give her a refill of Percocet. For the pruritus, she will continue her current treatment with Atarax and apply hydrocortisone cream to the affected area on as-needed basis. The patient will come back for follow-up visit in 3 weeks for evaluation before the next cycle of her treatment. She was advised to call immediately if she has any other concerning symptoms in the interval. All questions were answered. The patient knows to call the clinic with any problems, questions or concerns. We can certainly see the patient much sooner if necessary.  Disclaimer: This note was dictated with voice recognition software. Similar sounding words  can inadvertently be transcribed and may not be corrected upon review.

## 2021-05-02 NOTE — Telephone Encounter (Signed)
Reason for Disposition  Postmenopausal vaginal bleeding  Answer Assessment - Initial Assessment Questions 1. AMOUNT: "Describe the bleeding that you are having." "How much bleeding is there?" Very small/ spotting dark blood/discharge.   - SPOTTING: spotting, or pinkish / brownish mucous discharge; does not fill panty liner or pad    - MILD:  less than 1 pad / hour; less than patient's usual menstrual bleeding   - MODERATE: 1-2 pads / hour; 1 menstrual cup every 6 hours; small-medium blood clots (e.g., pea, grape, small coin)   - SEVERE: soaking 2 or more pads/hour for 2 or more hours; 1 menstrual cup every 2 hours; bleeding not contained by pads or continuous red blood from vagina; large blood clots (e.g., golf ball, large coin)      Spotting 2. ONSET: "When did the bleeding begin?" "Is it continuing now?"    Today 3. MENOPAUSE: "When was your last menstrual period?"      Had hysterectomy 1980 4. ABDOMINAL PAIN: "Do you have any pain?" "How bad is the pain?"  (e.g., Scale 1-10; mild, moderate, or severe)   - MILD (1-3): doesn't interfere with normal activities, abdomen soft and not tender to touch    - MODERATE (4-7): interferes with normal activities or awakens from sleep, abdomen tender to touch    - SEVERE (8-10): excruciating pain, doubled over, unable to do any normal activities      1 5. BLOOD THINNERS: "Do you take any blood thinners?" (e.g., Coumadin/warfarin, Pradaxa/dabigatran, aspirin)     Yes - Plavix and ASA 6. HORMONES: "Are you taking any hormone medications, prescription or OTC?" (e.g., birth control pills, estrogen)     no 7. CAUSE: "What do you think is causing the bleeding?" (e.g., recent gyn surgery, recent gyn procedure; known bleeding disorder, uterine cancer)       unknown 8. HEMODYNAMIC STATUS: "Are you weak or feeling lightheaded?" If Yes, ask: "Can you stand and walk normally?"       yes 9. OTHER SYMPTOMS: "What other symptoms are you having with the bleeding?"  (e.g., back pain, burning with urination, fever)     none  Protocols used: Vaginal Bleeding - Postmenopausal-A-AH

## 2021-05-02 NOTE — Patient Instructions (Signed)
[facility]  Discharge Instructions: Thank you for choosing [facility] to provide your oncology and hematology care.   If you have a lab appointment with the [facility], please go directly to the [facility] and check in at the registration area.   Wear comfortable clothing and clothing appropriate for easy access to any Portacath or PICC line.   We strive to give you quality time with your provider. You may need to reschedule your appointment if you arrive late (15 or more minutes).  Arriving late affects you and other patients whose appointments are after yours.  Also, if you miss three or more appointments without notifying the office, you may be dismissed from the clinic at the provider's discretion.      For prescription refill requests, have your pharmacy contact our office and allow 72 hours for refills to be completed.    Today you received the following chemotherapy and/or immunotherapy agents : Tecentriq      To help prevent nausea and vomiting after your treatment, we encourage you to take your nausea medication as directed.  BELOW ARE SYMPTOMS THAT SHOULD BE REPORTED IMMEDIATELY: *FEVER GREATER THAN 100.4 F (38 C) OR HIGHER *CHILLS OR SWEATING *NAUSEA AND VOMITING THAT IS NOT CONTROLLED WITH YOUR NAUSEA MEDICATION *UNUSUAL SHORTNESS OF BREATH *UNUSUAL BRUISING OR BLEEDING *URINARY PROBLEMS (pain or burning when urinating, or frequent urination) *BOWEL PROBLEMS (unusual diarrhea, constipation, pain near the anus) TENDERNESS IN MOUTH AND THROAT WITH OR WITHOUT PRESENCE OF ULCERS (sore throat, sores in mouth, or a toothache) UNUSUAL RASH, SWELLING OR PAIN  UNUSUAL VAGINAL DISCHARGE OR ITCHING   Items with * indicate a potential emergency and should be followed up as soon as possible or go to the Emergency Department if any problems should occur.  Please show the CHEMOTHERAPY ALERT CARD or IMMUNOTHERAPY ALERT CARD at check-in to  the Emergency Department and triage nurse.  Should you have questions after your visit or need to cancel or reschedule your appointment, please contact [facility]  Dept: [phone]  and follow the prompts.  Office hours are 8:00 a.m. to 4:30 p.m. Monday - Friday. Please note that voicemails left after 4:00 p.m. may not be returned until the following business day.  We are closed weekends and major holidays. You have access to a nurse at all times for urgent questions. Please call the main number to the clinic Dept: [phone] and follow the prompts.   For any non-urgent questions, you may also contact your provider using MyChart. We now offer e-Visits for anyone 40 and older to request care online for non-urgent symptoms. For details visit [email or web address].   Also download the MyChart app! Go to the app store, search "MyChart", open the app, select [facility], and log in with your MyChart username and password.  Due to Covid, a mask is required upon entering the hospital/clinic. If you do not have a mask, one will be given to you upon arrival. For doctor visits, patients may have 1 support person aged 60 or older with them. For treatment visits, patients cannot have anyone with them due to current Covid guidelines and our immunocompromised population.   Hypokalemia Hypokalemia means that the amount of potassium in the blood is lower than normal. Potassium is a chemical (electrolyte) that helps regulate the amount of fluid in the body. It also stimulates muscle tightening (contraction) and helps nerves work properly. Normally, most of the body's potassium is  inside cells, and only a very small amount is in the blood. Because the amount in the blood is so small, minor changes to potassium levels in the blood can be life-threatening. What are the causes? This condition may be caused by: Antibiotic medicine. Diarrhea or vomiting. Taking too much of a medicine  that helps you have a bowel movement (laxative) can cause diarrhea and lead to hypokalemia. Chronic kidney disease (CKD). Medicines that help the body get rid of excess fluid (diuretics). Eating disorders, such as bulimia. Low magnesium levels in the body. Sweating a lot. What are the signs or symptoms? Symptoms of this condition include: Weakness. Constipation. Fatigue. Muscle cramps. Mental confusion. Skipped heartbeats or irregular heartbeat (palpitations). Tingling or numbness. How is this diagnosed? This condition is diagnosed with a blood test. How is this treated? This condition may be treated by: Taking potassium supplements by mouth. Adjusting the medicines that you take. Eating more foods that contain a lot of potassium. If your potassium level is very low, you may need to get potassium through an IV and be monitored in the hospital. Follow these instructions at home:  Take over-the-counter and prescription medicines only as told by your health care provider. This includes vitamins and supplements. Eat a healthy diet. A healthy diet includes fresh fruits and vegetables, whole grains, healthy fats, and lean proteins. If instructed, eat more foods that contain a lot of potassium. This includes: Nuts, such as peanuts and pistachios. Seeds, such as sunflower seeds and pumpkin seeds. Peas, lentils, and lima beans. Whole grain and bran cereals and breads. Fresh fruits and vegetables, such as apricots, avocado, bananas, cantaloupe, kiwi, oranges, tomatoes, asparagus, and potatoes. Orange juice. Tomato juice. Red meats. Yogurt. Keep all follow-up visits as told by your health care provider. This is important. Contact a health care provider if you: Have weakness that gets worse. Feel your heart pounding or racing. Vomit. Have diarrhea. Have diabetes (diabetes mellitus) and you have trouble keeping your blood sugar (glucose) in your target range. Get help right away if  you: Have chest pain. Have shortness of breath. Have vomiting or diarrhea that lasts for more than 2 days. Faint. Summary Hypokalemia means that the amount of potassium in the blood is lower than normal. This condition is diagnosed with a blood test. Hypokalemia may be treated by taking potassium supplements, adjusting the medicines that you take, or eating more foods that are high in potassium. If your potassium level is very low, you may need to get potassium through an IV and be monitored in the hospital. This information is not intended to replace advice given to you by your health care provider. Make sure you discuss any questions you have with your health care provider. Document Revised: 03/31/2018 Document Reviewed: 04/01/2018 Elsevier Patient Education  [place].

## 2021-05-02 NOTE — Progress Notes (Signed)
Nutrition Follow-up:  Patient with extensive stage small cell lung cancer. She is receiving maintenance treatment with Tecentriq every 3 weeks.   Met with patient during infusion. She reports appetite has improved, eating more and has increased energy. Patient is taking folvite, but has not started B12 supplements. Yesterday patient recalls eating homemade chicken soup that her husband prepared, glass of whole milk, 1 large Pepsi, snacked on peanut butter crackers, and ate bowl of ice cream before bed. Patient is drinking CIB but not everyday. She denies nausea, vomiting, constipation, diarrhea.   Medications: Valium, Folvite, Hydroxyzine, Percocet, Potassium chloride, Compazine, Tramadol  Labs: K 3.4, Glucose 122, BUN <4, Alkaline Phosphatase 127  Anthropometrics: Weight 110 lb 14.4 oz today stable from 109.7 lbs on 8/10  NUTRITION DIAGNOSIS: Unintended weight loss  stable    INTERVENTION:  Continue eating high calorie, high protein foods to promote weight gain Encouraged eating smaller meals and snacks more frequently Continue eating bedtime snack for added calories and protein Encouraged daily intake oral nutrition supplement -CIB mixed with whole milk/Ensure Plus, provided Ensure coupons Encouraged activity as able  Contact information provided    MONITORING, EVALUATION, GOAL: weight trends, intake   NEXT VISIT: Thursday September 22 with Pamala Hurry

## 2021-05-02 NOTE — Patient Instructions (Signed)
Steps to Quit Smoking Smoking tobacco is the leading cause of preventable death. It can affect almost every organ in the body. Smoking puts you and people around you at risk for many serious, long-lasting (chronic) diseases. Quitting smoking can be hard, but it is one of the best things that you can do for your health. It is never too late to quit. How do I get ready to quit? When you decide to quit smoking, make a plan to help you succeed. Before you quit: Pick a date to quit. Set a date within the next 2 weeks to give you time to prepare. Write down the reasons why you are quitting. Keep this list in places where you will see it often. Tell your family, friends, and co-workers that you are quitting. Their support is important. Talk with your doctor about the choices that may help you quit. Find out if your health insurance will pay for these treatments. Know the people, places, things, and activities that make you want to smoke (triggers). Avoid them. What first steps can I take to quit smoking? Throw away all cigarettes at home, at work, and in your car. Throw away the things that you use when you smoke, such as ashtrays and lighters. Clean your car. Make sure to empty the ashtray. Clean your home, including curtains and carpets. What can I do to help me quit smoking? Talk with your doctor about taking medicines and seeing a counselor at the same time. You are more likely to succeed when you do both. If you are pregnant or breastfeeding, talk with your doctor about counseling or other ways to quit smoking. Do not take medicine to help you quit smoking unless your doctor tells you to do so. To quit smoking: Quit right away Quit smoking totally, instead of slowly cutting back on how much you smoke over a period of time. Go to counseling. You are more likely to quit if you go to counseling sessions regularly. Take medicine You may take medicines to help you quit. Some medicines need a  prescription, and some you can buy over-the-counter. Some medicines may contain a drug called nicotine to replace the nicotine in cigarettes. Medicines may: Help you to stop having the desire to smoke (cravings). Help to stop the problems that come when you stop smoking (withdrawal symptoms). Your doctor may ask you to use: Nicotine patches, gum, or lozenges. Nicotine inhalers or sprays. Non-nicotine medicine that is taken by mouth. Find resources Find resources and other ways to help you quit smoking and remain smoke-free after you quit. These resources are most helpful when you use them often. They include: Online chats with a counselor. Phone quitlines. Printed self-help materials. Support groups or group counseling. Text messaging programs. Mobile phone apps. Use apps on your mobile phone or tablet that can help you stick to your quit plan. There are many free apps for mobile phones and tablets as well as websites. Examples include Quit Guide from the CDC and smokefree.gov  What things can I do to make it easier to quit?  Talk to your family and friends. Ask them to support and encourage you. Call a phone quitline (1-800-QUIT-NOW), reach out to support groups, or work with a counselor. Ask people who smoke to not smoke around you. Avoid places that make you want to smoke, such as: Bars. Parties. Smoke-break areas at work. Spend time with people who do not smoke. Lower the stress in your life. Stress can make you want to   smoke. Try these things to help your stress: Getting regular exercise. Doing deep-breathing exercises. Doing yoga. Meditating. Doing a body scan. To do this, close your eyes, focus on one area of your body at a time from head to toe. Notice which parts of your body are tense. Try to relax the muscles in those areas. How will I feel when I quit smoking? Day 1 to 3 weeks Within the first 24 hours, you may start to have some problems that come from quitting tobacco.  These problems are very bad 2-3 days after you quit, but they do not often last for more than 2-3 weeks. You may get these symptoms: Mood swings. Feeling restless, nervous, angry, or annoyed. Trouble concentrating. Dizziness. Strong desire for high-sugar foods and nicotine. Weight gain. Trouble pooping (constipation). Feeling like you may vomit (nausea). Coughing or a sore throat. Changes in how the medicines that you take for other issues work in your body. Depression. Trouble sleeping (insomnia). Week 3 and afterward After the first 2-3 weeks of quitting, you may start to notice more positive results, such as: Better sense of smell and taste. Less coughing and sore throat. Slower heart rate. Lower blood pressure. Clearer skin. Better breathing. Fewer sick days. Quitting smoking can be hard. Do not give up if you fail the first time. Some people need to try a few times before they succeed. Do your best to stick to your quit plan, and talk with your doctor if you have any questions or concerns. Summary Smoking tobacco is the leading cause of preventable death. Quitting smoking can be hard, but it is one of the best things that you can do for your health. When you decide to quit smoking, make a plan to help you succeed. Quit smoking right away, not slowly over a period of time. When you start quitting, seek help from your doctor, family, or friends. This information is not intended to replace advice given to you by your health care provider. Make sure you discuss any questions you have with your health care provider. Document Revised: 05/14/2019 Document Reviewed: 11/07/2018 Elsevier Patient Education  2022 Elsevier Inc.  

## 2021-05-03 ENCOUNTER — Telehealth: Payer: Self-pay | Admitting: Medical Oncology

## 2021-05-03 NOTE — Telephone Encounter (Signed)
Copay assistance form completed and faxed to the foundation.

## 2021-05-14 ENCOUNTER — Telehealth: Payer: Self-pay | Admitting: Medical Oncology

## 2021-05-14 NOTE — Telephone Encounter (Signed)
Asking about copay assistance form . Paper faxed and pt notified and given foundations number.

## 2021-05-21 NOTE — Progress Notes (Signed)
[facility] OFFICE PROGRESS NOTE  [name], MD [address] [place] [address]  DIAGNOSIS:  Extensive stage (T2a, N3, [name], large right anterior mediastinal and supraclavicular lymphadenopathy as well as pancreatic and splenic metastasis diagnosed in September 2017.  The patient had disease progression in October 2019.  PRIOR THERAPY: 1) Systemic chemotherapy was carboplatin for AUC of 5 on day 1 and etoposide 100 MG/M2 on days 1, 2 and 3 with Neulasta support. Status post 6 cycles with significant response of her disease. 2) Prophylactic cranial irradiation under the care of Dr. Sondra Come on 12/02/2016. 3) stereotactic radiotherapy to the recurrent right upper lobe pulmonary nodule under the care of Dr. Sondra Come completed November 10, 2017. 4) Retreatment with systemic chemotherapy with carboplatin for AUC of 5 on day 1 and etoposide 100 mg/M2 on days 1, 2 and 3 as well as Tecentriq (Atezolizumab) 1200 mg IV every 3 weeks with Neulasta support.  First dose June 22, 2018 for disease recurrence.  Status post 5 cycles.  Starting from cycle #2 her dose of carboplatin will be reduced to AUC of 4 and etoposide 80 mg/M2 on days 1, 2 and 3 in addition to the regular dose of Tecentriq.  CURRENT THERAPY: Maintenance treatment with single agent Tecentriq 1200 mg IV every 3 weeks.  Status post 49 cycles.  INTERVAL HISTORY: Debra Kidd 60 y.o. female returns to the clinic today for a follow-up visit. She is feeling fairly well today without any concerning complaints except for she needs a refill of her folic acid, EMLA cream, and atarax.  She notes her itching without rash has been worse recently.   Otherwise she denies any new concerning complaints today besides the itching. He denies any recent fever, chills, or night sweats. Her weight is stable but struggles with decreased appetite and weight loss. She is scheduled to see  the nutritionist today. The patient denies any new nausea, vomiting, constipation, or diarrhea. She denies any changes with her baseline shortness of breath with exertion.  She denies any hemoptysis, cough, or chest pain. She unfortunately continues to smoke about 1.5 packs of cigarettes per day. She is receiving financial assistance and is expected to receive a card in the mail today. She denies frequent headaches but she has a headache this morning on the left side of her head. She is here today for evaluation and repeat blood work before starting cycle #50.     MEDICAL HISTORY: Past Medical History:  Diagnosis Date   Basal cell carcinoma of cheek    L side of face   Blood type, Rh positive    Cancer (HCC)    Chronic pain syndrome    Depression 08/07/2016   Encounter for antineoplastic chemotherapy 07/17/2016   History of external beam radiation therapy 11/19/16-12/02/16   brain 25 Gy in 10 fractions   Hyperlipidemia    Hypertension    Hypertension 08/07/2016   Osteoporosis    Small cell lung cancer ([place]) dx'd 05/2016    ALLERGIES:  is allergic to codeine, motrin [ibuprofen], thiazide-type diuretics, and vicodin [hydrocodone-acetaminophen].  MEDICATIONS:  Current Outpatient Medications  Medication Sig Dispense Refill   clopidogrel (PLAVIX) 75 MG tablet Take 1 tablet (75 mg total) by mouth daily. 30 tablet 0   diazepam (VALIUM) 5 MG tablet Take 5 mg by mouth 3 (three) times daily as needed for anxiety.   2   folic acid (FOLVITE)  1 MG tablet Take 1 tablet (1 mg total) by mouth daily. 30 tablet 1   hydrOXYzine (ATARAX/VISTARIL) 10 MG tablet Take 1 tablet (10 mg total) by mouth 3 (three) times daily as needed. 90 tablet 1   lidocaine-prilocaine (EMLA) cream Apply 1 application topically as needed. 30 g 2   oxyCODONE-acetaminophen (PERCOCET) 5-325 MG tablet Take 1 tablet by mouth every 6 (six) hours as needed for severe pain. 30 tablet 0   potassium chloride 20 MEQ/15ML (10%) SOLN Take 15  mLs (20 mEq total) by mouth daily. 105 mL 0   prochlorperazine (COMPAZINE) 10 MG tablet Take 1 tablet (10 mg total) by mouth every 6 (six) hours as needed for nausea or vomiting. 30 tablet 2   traMADol (ULTRAM) 50 MG tablet Take 50 mg by mouth 4 (four) times daily as needed for moderate pain (confirmed with her Pharmacy-Dr K. Little 03/15 she received #120).     No current facility-administered medications for this visit.    SURGICAL HISTORY:  Past Surgical History:  Procedure Laterality Date   ABDOMINAL HYSTERECTOMY  1981   APPENDECTOMY     at age 43   EYE SURGERY     left   IR GENERIC HISTORICAL  06/03/2016   IR FLUORO GUIDE PORT INSERTION RIGHT 06/03/2016 [facility]   IR GENERIC HISTORICAL  06/03/2016   IR US GUIDE VASC ACCESS RIGHT 06/03/2016 [facility]   SKIN CANCER EXCISION     basal cell carcinoma L side of face    REVIEW OF SYSTEMS:   Review of Systems  Constitutional: Positive for fatigue, weight loss, and decreased appetite. Negative for chills and fever. HENT: Negative for mouth sores, nosebleeds, sore throat and trouble swallowing.   Eyes: Positive for itchy watery eyes. Negative for icterus. Respiratory: Positive for baseline cough and shortness of breath with exertion. Negative for hemoptysis and wheezing.   Cardiovascular:  Negative for chest pain and leg swelling. Gastrointestinal: Negative for abdominal pain, and vomiting. Genitourinary: Negative for bladder incontinence, difficulty urinating, dysuria, frequency and hematuria.   Musculoskeletal: Negative for gait problem, neck pain and neck stiffness. Skin: Positive for itching. Negative for rash. Neurological: Positive for headache today. Lower extremity weakness, gradually worsening L>R due to prior stoke. Negative for dizziness, light-headedness and seizures. Hematological: Negative for adenopathy. Does not bruise/bleed easily. Psychiatric/Behavioral: Negative for confusion, depression and sleep disturbance.  The patient is not nervous/anxious    PHYSICAL EXAMINATION:  Blood pressure 112/85, pulse 82, temperature (!) 97.1 F (36.2 C), resp. rate 18, weight 111 lb 11.2 oz (50.7 kg), SpO2 100 %.  ECOG PERFORMANCE STATUS: 2-3  Physical Exam  Constitutional: Oriented to person, place, and time and chronically ill appearing female and in no distress. HENT: Head: Normocephalic and atraumatic. Mouth/Throat: Oropharynx is clear and moist. No oropharyngeal exudate. Eyes: Conjunctivae are normal. Right eye exhibits no discharge. Left eye exhibits no discharge. No scleral icterus. Neck: Normal range of motion. Neck supple. Cardiovascular: Normal rate, regular rhythm, normal heart sounds and intact distal pulses.   Pulmonary/Chest: Effort normal.  Quiet breath sounds in all lung fields.  No respiratory distress. No wheezes. No rales. Abdominal: Soft. Bowel sounds are normal. Exhibits no distension and no mass. There is no tenderness.  Musculoskeletal: Normal range of motion. Exhibits no edema.  Lymphadenopathy:    No cervical adenopathy.  Neurological: Alert and oriented to person, place, and time. Exhibits muscle wasting. Examined in the wheelchair.  Skin: Skin is warm and dry. No rash noted. Not  diaphoretic. No erythema. No pallor.  Psychiatric: Mood, memory and judgment normal. Vitals reviewed.    LABORATORY DATA: Lab Results  Component Value Date   WBC 8.6 05/24/2021   HGB 11.5 (L) 05/24/2021   HCT 34.1 (L) 05/24/2021   MCV 106.6 (H) 05/24/2021   PLT 278 05/24/2021      Chemistry      Component Value Date/Time   NA 140 05/02/2021 0858   NA 141 08/01/2017 0835   K 3.4 (L) 05/02/2021 0858   K 3.5 08/01/2017 0835   CL 104 05/02/2021 0858   CO2 25 05/02/2021 0858   CO2 25 08/01/2017 0835   BUN <4 (L) 05/02/2021 0858   BUN 8.0 08/01/2017 0835   CREATININE 0.71 05/02/2021 0858   CREATININE 0.9 08/01/2017 0835      Component Value Date/Time   CALCIUM 8.8 (L) 05/02/2021 0858    CALCIUM 9.9 08/01/2017 0835   ALKPHOS 127 (H) 05/02/2021 0858   ALKPHOS 142 08/01/2017 0835   AST 18 05/02/2021 0858   AST 15 08/01/2017 0835   ALT 14 05/02/2021 0858   ALT 19 08/01/2017 0835   BILITOT 0.4 05/02/2021 0858   BILITOT 0.35 08/01/2017 0835       RADIOGRAPHIC STUDIES:  CT Chest W Contrast  Result Date: 05/01/2021 CLINICAL DATA:  Primary Cancer Type: Lung Imaging Indication: Assess response to therapy Interval therapy since last imaging? Yes Initial Cancer Diagnosis Date: 05/21/2016; Established by: Biopsy-proven Detailed Pathology: Extensive stage small cell lung cancer. Primary Tumor location: Right upper lobe. Pancreatic and splenic metastases. Surgeries: Hysterectomy.  Appendectomy. Chemotherapy: Yes; Ongoing?  No; Most recent administration: 2019 Immunotherapy?  Yes; Type: Tecentriq; Ongoing? Yes Radiation therapy? Yes Date Range: 11/04/2017 - 11/10/2017; Target: Right lung Date Range: 11/19/2016 - 12/02/2016; Target: prophylactic cranial irradiation EXAM: CT CHEST, ABDOMEN, AND PELVIS WITH CONTRAST TECHNIQUE: Multidetector CT imaging of the chest, abdomen and pelvis was performed following the standard protocol during bolus administration of intravenous contrast. CONTRAST:  36mL OMNIPAQUE IOHEXOL 350 MG/ML SOLN COMPARISON:  Most recent CT chest, abdomen and pelvis 02/05/2021. FINDINGS: CT CHEST FINDINGS Cardiovascular: Right chest port catheter. Aortic atherosclerosis. Normal heart size. Left and right coronary artery calcifications. No pericardial effusion. Mediastinum/Nodes: No enlarged mediastinal, hilar, or axillary lymph nodes. Thyroid gland, trachea, and esophagus demonstrate no significant findings. Lungs/Pleura: Unchanged appearance of a spiculated pleural based mass of the peripheral right apex, measuring 3.7 x 2.4 cm (series 4, image 25). Unchanged background of very fine centrilobular pulmonary nodules. No pleural effusion or pneumothorax. Musculoskeletal: No chest wall  mass or suspicious bone lesions identified. Unchanged subtle wedge deformities of T7 through T9. CT ABDOMEN PELVIS FINDINGS Hepatobiliary: No solid liver abnormality is seen. No gallstones, gallbladder wall thickening, or biliary dilatation. Pancreas: Unremarkable. No pancreatic ductal dilatation or surrounding inflammatory changes. Spleen: Normal in size without significant abnormality. Adrenals/Urinary Tract: Adrenal glands are unremarkable. Kidneys are normal, without renal calculi, solid lesion, or hydronephrosis. Bladder is unremarkable. Stomach/Bowel: Stomach is within normal limits. Appendix is not clearly visualized and may be surgically absent. No evidence of bowel wall thickening, distention, or inflammatory changes. Vascular/Lymphatic: Aortic atherosclerosis. No enlarged abdominal or pelvic lymph nodes. Reproductive: Status post hysterectomy. Other: No abdominal wall hernia or abnormality. No abdominopelvic ascites. Musculoskeletal: No acute or significant osseous findings. Unchanged superior endplate deformity of L2. IMPRESSION: 1. Unchanged post treatment appearance of a spiculated pleural based mass of the peripheral right apex. 2. No evidence of recurrent or metastatic disease in the chest, abdomen, or  pelvis. 3. Unchanged background of very fine centrilobular pulmonary nodules, most commonly seen in smoking-related respiratory bronchiolitis. 4. Coronary artery disease. Aortic Atherosclerosis (ICD10-I70.0). Electronically Signed   By: Eddie Candle M.D.   On: 05/01/2021 11:57   CT Abdomen Pelvis W Contrast  Result Date: 05/01/2021 CLINICAL DATA:  Primary Cancer Type: Lung Imaging Indication: Assess response to therapy Interval therapy since last imaging? Yes Initial Cancer Diagnosis Date: 05/21/2016; Established by: Biopsy-proven Detailed Pathology: Extensive stage small cell lung cancer. Primary Tumor location: Right upper lobe. Pancreatic and splenic metastases. Surgeries: Hysterectomy.   Appendectomy. Chemotherapy: Yes; Ongoing?  No; Most recent administration: 2019 Immunotherapy?  Yes; Type: Tecentriq; Ongoing? Yes Radiation therapy? Yes Date Range: 11/04/2017 - 11/10/2017; Target: Right lung Date Range: 11/19/2016 - 12/02/2016; Target: prophylactic cranial irradiation EXAM: CT CHEST, ABDOMEN, AND PELVIS WITH CONTRAST TECHNIQUE: Multidetector CT imaging of the chest, abdomen and pelvis was performed following the standard protocol during bolus administration of intravenous contrast. CONTRAST:  17mL OMNIPAQUE IOHEXOL 350 MG/ML SOLN COMPARISON:  Most recent CT chest, abdomen and pelvis 02/05/2021. FINDINGS: CT CHEST FINDINGS Cardiovascular: Right chest port catheter. Aortic atherosclerosis. Normal heart size. Left and right coronary artery calcifications. No pericardial effusion. Mediastinum/Nodes: No enlarged mediastinal, hilar, or axillary lymph nodes. Thyroid gland, trachea, and esophagus demonstrate no significant findings. Lungs/Pleura: Unchanged appearance of a spiculated pleural based mass of the peripheral right apex, measuring 3.7 x 2.4 cm (series 4, image 25). Unchanged background of very fine centrilobular pulmonary nodules. No pleural effusion or pneumothorax. Musculoskeletal: No chest wall mass or suspicious bone lesions identified. Unchanged subtle wedge deformities of T7 through T9. CT ABDOMEN PELVIS FINDINGS Hepatobiliary: No solid liver abnormality is seen. No gallstones, gallbladder wall thickening, or biliary dilatation. Pancreas: Unremarkable. No pancreatic ductal dilatation or surrounding inflammatory changes. Spleen: Normal in size without significant abnormality. Adrenals/Urinary Tract: Adrenal glands are unremarkable. Kidneys are normal, without renal calculi, solid lesion, or hydronephrosis. Bladder is unremarkable. Stomach/Bowel: Stomach is within normal limits. Appendix is not clearly visualized and may be surgically absent. No evidence of bowel wall thickening, distention,  or inflammatory changes. Vascular/Lymphatic: Aortic atherosclerosis. No enlarged abdominal or pelvic lymph nodes. Reproductive: Status post hysterectomy. Other: No abdominal wall hernia or abnormality. No abdominopelvic ascites. Musculoskeletal: No acute or significant osseous findings. Unchanged superior endplate deformity of L2. IMPRESSION: 1. Unchanged post treatment appearance of a spiculated pleural based mass of the peripheral right apex. 2. No evidence of recurrent or metastatic disease in the chest, abdomen, or pelvis. 3. Unchanged background of very fine centrilobular pulmonary nodules, most commonly seen in smoking-related respiratory bronchiolitis. 4. Coronary artery disease. Aortic Atherosclerosis (ICD10-I70.0). Electronically Signed   By: Eddie Candle M.D.   On: 05/01/2021 11:57    ASSESSMENT/PLAN: This is a very pleasant 60 year old Caucasian female with extensive stage small cell lung cancer.  She presented with a right upper lobe lung mass, large right anterior mediastinal and supraclavicular lymphadenopathy as well as a pancreatic and splenic metastasis.  She was diagnosed in September 2017.   The patient underwent systemic chemotherapy with carboplatin and etoposide.  She is status post 6 cycles.  She tolerated treatment well except for chemotherapy-induced anemia which required  pRBCs and platelet transfusions.  She had a significant improvement of her disease with chemotherapy. The patient then underwent prophylactic cranial irradiation. She then underwent stereotactic radiotherapy to the right upper lobe and pulmonary nodule.  She had been on observation for 2 years before showing evidence of disease progression.   She then  was started on systemic chemotherapy with carboplatin, etoposide, and Tecentriq. Starting from cycle #5, she has been on maintenance single agent Tecentriq.  She has been tolerating treatment well. She is status post 49 cycles of  Tecentriq total.  Labs are  reviewed.  Recommend that she proceed with cycle #50 today scheduled.  We will see her back for a follow up visit in 3 weeks for evaluation before starting cycle #51.   She denies frequent headaches but has a headache that started this morning. We will give her 650 mg of tylenol today.   I have refilled her EMLA cream, atarax, and folic acid.   She was strongly encouraged to quit smoking.   She will see a member of the nutritionist team while in the infusion room today for her decreased appetite.   The patient was advised to call immediately if she has any concerning symptoms in the interval. The patient voices understanding of current disease status and treatment options and is in agreement with the current care plan. All questions were answered. The patient knows to call the clinic with any problems, questions or concerns. We can certainly see the patient much sooner if necessary   No orders of the defined types were placed in this encounter.    The total time spent in the appointment was 20-29 minutes.   Arihant Pennings L Zakar Brosch, PA-C 05/24/21

## 2021-05-23 ENCOUNTER — Ambulatory Visit: Payer: Medicare Other

## 2021-05-23 ENCOUNTER — Other Ambulatory Visit: Payer: Medicare Other

## 2021-05-23 ENCOUNTER — Ambulatory Visit: Payer: Medicare Other | Admitting: Physician Assistant

## 2021-05-24 ENCOUNTER — Other Ambulatory Visit: Payer: Self-pay

## 2021-05-24 ENCOUNTER — Inpatient Hospital Stay: Payer: Medicare Other | Admitting: Nutrition

## 2021-05-24 ENCOUNTER — Inpatient Hospital Stay: Payer: Medicare Other | Attending: Internal Medicine | Admitting: Physician Assistant

## 2021-05-24 ENCOUNTER — Inpatient Hospital Stay: Payer: Medicare Other

## 2021-05-24 VITALS — BP 112/85 | HR 82 | Temp 97.1°F | Resp 18 | Wt 111.7 lb

## 2021-05-24 VITALS — Temp 98.7°F

## 2021-05-24 DIAGNOSIS — C7889 Secondary malignant neoplasm of other digestive organs: Secondary | ICD-10-CM | POA: Diagnosis not present

## 2021-05-24 DIAGNOSIS — C3491 Malignant neoplasm of unspecified part of right bronchus or lung: Secondary | ICD-10-CM

## 2021-05-24 DIAGNOSIS — R5383 Other fatigue: Secondary | ICD-10-CM

## 2021-05-24 DIAGNOSIS — R519 Headache, unspecified: Secondary | ICD-10-CM | POA: Diagnosis not present

## 2021-05-24 DIAGNOSIS — L299 Pruritus, unspecified: Secondary | ICD-10-CM

## 2021-05-24 DIAGNOSIS — E538 Deficiency of other specified B group vitamins: Secondary | ICD-10-CM

## 2021-05-24 DIAGNOSIS — E785 Hyperlipidemia, unspecified: Secondary | ICD-10-CM | POA: Insufficient documentation

## 2021-05-24 DIAGNOSIS — C3411 Malignant neoplasm of upper lobe, right bronchus or lung: Secondary | ICD-10-CM | POA: Insufficient documentation

## 2021-05-24 DIAGNOSIS — Z85828 Personal history of other malignant neoplasm of skin: Secondary | ICD-10-CM | POA: Insufficient documentation

## 2021-05-24 DIAGNOSIS — R634 Abnormal weight loss: Secondary | ICD-10-CM | POA: Insufficient documentation

## 2021-05-24 DIAGNOSIS — C7970 Secondary malignant neoplasm of unspecified adrenal gland: Secondary | ICD-10-CM | POA: Insufficient documentation

## 2021-05-24 DIAGNOSIS — Z5112 Encounter for antineoplastic immunotherapy: Secondary | ICD-10-CM | POA: Diagnosis present

## 2021-05-24 DIAGNOSIS — Z79899 Other long term (current) drug therapy: Secondary | ICD-10-CM | POA: Insufficient documentation

## 2021-05-24 DIAGNOSIS — I2510 Atherosclerotic heart disease of native coronary artery without angina pectoris: Secondary | ICD-10-CM | POA: Insufficient documentation

## 2021-05-24 DIAGNOSIS — I700 Atherosclerosis of aorta: Secondary | ICD-10-CM | POA: Diagnosis not present

## 2021-05-24 DIAGNOSIS — D6481 Anemia due to antineoplastic chemotherapy: Secondary | ICD-10-CM | POA: Insufficient documentation

## 2021-05-24 DIAGNOSIS — I10 Essential (primary) hypertension: Secondary | ICD-10-CM | POA: Insufficient documentation

## 2021-05-24 DIAGNOSIS — Z5111 Encounter for antineoplastic chemotherapy: Secondary | ICD-10-CM | POA: Insufficient documentation

## 2021-05-24 DIAGNOSIS — F17210 Nicotine dependence, cigarettes, uncomplicated: Secondary | ICD-10-CM | POA: Diagnosis not present

## 2021-05-24 LAB — CBC WITH DIFFERENTIAL (CANCER CENTER ONLY)
Abs Immature Granulocytes: 0.02 10*3/uL (ref 0.00–0.07)
Basophils Absolute: 0 10*3/uL (ref 0.0–0.1)
Basophils Relative: 1 %
Eosinophils Absolute: 0.2 10*3/uL (ref 0.0–0.5)
Eosinophils Relative: 2 %
HCT: 34.1 % — ABNORMAL LOW (ref 36.0–46.0)
Hemoglobin: 11.5 g/dL — ABNORMAL LOW (ref 12.0–15.0)
Immature Granulocytes: 0 %
Lymphocytes Relative: 28 %
Lymphs Abs: 2.4 10*3/uL (ref 0.7–4.0)
MCH: 35.9 pg — ABNORMAL HIGH (ref 26.0–34.0)
MCHC: 33.7 g/dL (ref 30.0–36.0)
MCV: 106.6 fL — ABNORMAL HIGH (ref 80.0–100.0)
Monocytes Absolute: 0.5 10*3/uL (ref 0.1–1.0)
Monocytes Relative: 6 %
Neutro Abs: 5.4 10*3/uL (ref 1.7–7.7)
Neutrophils Relative %: 63 %
Platelet Count: 278 10*3/uL (ref 150–400)
RBC: 3.2 MIL/uL — ABNORMAL LOW (ref 3.87–5.11)
RDW: 11.8 % (ref 11.5–15.5)
WBC Count: 8.6 10*3/uL (ref 4.0–10.5)
nRBC: 0 % (ref 0.0–0.2)

## 2021-05-24 LAB — CMP (CANCER CENTER ONLY)
ALT: 13 U/L (ref 0–44)
AST: 19 U/L (ref 15–41)
Albumin: 3.1 g/dL — ABNORMAL LOW (ref 3.5–5.0)
Alkaline Phosphatase: 113 U/L (ref 38–126)
Anion gap: 9 (ref 5–15)
BUN: <4 mg/dL — ABNORMAL LOW (ref 8–23)
CO2: 25 mmol/L (ref 22–32)
Calcium: 8.8 mg/dL — ABNORMAL LOW (ref 8.9–10.3)
Chloride: 103 mmol/L (ref 98–111)
Creatinine: 0.69 mg/dL (ref 0.44–1.00)
GFR, Estimated: >60 mL/min (ref >60)
Glucose, Bld: 104 mg/dL — ABNORMAL HIGH (ref 70–99)
Potassium: 3.6 mmol/L (ref 3.5–5.1)
Sodium: 137 mmol/L (ref 135–145)
Total Bilirubin: 0.3 mg/dL (ref 0.3–1.2)
Total Protein: 6.3 g/dL — ABNORMAL LOW (ref 6.5–8.1)

## 2021-05-24 LAB — TSH: TSH: 1.82 u[IU]/mL (ref 0.308–3.960)

## 2021-05-24 MED ORDER — ACETAMINOPHEN 325 MG PO TABS
650.0000 mg | ORAL_TABLET | Freq: Once | ORAL | Status: AC
  Administered 2021-05-24: 650 mg via ORAL
  Filled 2021-05-24: qty 2

## 2021-05-24 MED ORDER — LIDOCAINE-PRILOCAINE 2.5-2.5 % EX CREA: 1.0000 "application " | TOPICAL_CREAM | CUTANEOUS | 2 refills | Status: DC | PRN

## 2021-05-24 MED ORDER — FOLIC ACID 1 MG PO TABS: 1.0000 mg | ORAL_TABLET | Freq: Every day | ORAL | 1 refills | Status: DC

## 2021-05-24 MED ORDER — HEPARIN SOD (PORK) LOCK FLUSH 100 UNIT/ML IV SOLN
500.0000 [IU] | Freq: Once | INTRAVENOUS | Status: AC | PRN
  Administered 2021-05-24: 500 [IU]

## 2021-05-24 MED ORDER — HYDROXYZINE HCL 10 MG PO TABS: 10.0000 mg | ORAL_TABLET | Freq: Three times a day (TID) | ORAL | 1 refills | Status: DC | PRN

## 2021-05-24 MED ORDER — SODIUM CHLORIDE 0.9% FLUSH
10.0000 mL | INTRAVENOUS | Status: DC | PRN
Start: 2021-05-24 — End: 2021-05-24
  Administered 2021-05-24: 10 mL

## 2021-05-24 MED ORDER — SODIUM CHLORIDE 0.9 % IV SOLN: Freq: Once | INTRAVENOUS | Status: AC

## 2021-05-24 MED ORDER — SODIUM CHLORIDE 0.9% FLUSH
10.0000 mL | INTRAVENOUS | Status: DC | PRN
  Administered 2021-05-24: 10 mL

## 2021-05-24 MED ORDER — SODIUM CHLORIDE 0.9 % IV SOLN
1200.0000 mg | Freq: Once | INTRAVENOUS | Status: AC
  Administered 2021-05-24: 1200 mg via INTRAVENOUS
  Filled 2021-05-24: qty 20

## 2021-05-24 NOTE — Progress Notes (Signed)
Nutrition follow-up completed with patient receiving maintenance therapy with Tecentriq every 3 weeks for extensive stage small cell lung cancer.  Brief follow-up with patient during infusion.  Patient reports her appetite is stable and she continues to eat normally.  Her weight is slightly improved and was documented as 111 pounds.  Patient denies nutrition impact symptoms.  She has no questions or concerns.  Patient reports she drinks Carnation breakfast essentials or boost.  Nutrition diagnosis: Unintended weight loss is stable.  Intervention: Encourage patient to continue strategies for adequate calorie and protein intake to minimize weight loss. Continue oral nutrition supplements of choice, preferably once daily. Bowel regimen.  Monitoring, evaluation, goals: Patient will tolerate adequate calories and protein to minimize weight loss.  Next visit: Wednesday, November 23 during infusion.  **Disclaimer: This note was dictated with voice recognition software. Similar sounding words can inadvertently be transcribed and this note may contain transcription errors which may not have been corrected upon publication of note.**

## 2021-05-24 NOTE — Patient Instructions (Signed)
[facility]  Discharge Instructions: Thank you for choosing [facility] to provide your oncology and hematology care.   If you have a lab appointment with the [facility], please go directly to the [facility] and check in at the registration area.   Wear comfortable clothing and clothing appropriate for easy access to any Portacath or PICC line.   We strive to give you quality time with your provider. You may need to reschedule your appointment if you arrive late (15 or more minutes).  Arriving late affects you and other patients whose appointments are after yours.  Also, if you miss three or more appointments without notifying the office, you may be dismissed from the clinic at the provider's discretion.      For prescription refill requests, have your pharmacy contact our office and allow 72 hours for refills to be completed.    Today you received the following chemotherapy and/or immunotherapy agents : Tecentriq      To help prevent nausea and vomiting after your treatment, we encourage you to take your nausea medication as directed.  BELOW ARE SYMPTOMS THAT SHOULD BE REPORTED IMMEDIATELY: *FEVER GREATER THAN 100.4 F (38 C) OR HIGHER *CHILLS OR SWEATING *NAUSEA AND VOMITING THAT IS NOT CONTROLLED WITH YOUR NAUSEA MEDICATION *UNUSUAL SHORTNESS OF BREATH *UNUSUAL BRUISING OR BLEEDING *URINARY PROBLEMS (pain or burning when urinating, or frequent urination) *BOWEL PROBLEMS (unusual diarrhea, constipation, pain near the anus) TENDERNESS IN MOUTH AND THROAT WITH OR WITHOUT PRESENCE OF ULCERS (sore throat, sores in mouth, or a toothache) UNUSUAL RASH, SWELLING OR PAIN  UNUSUAL VAGINAL DISCHARGE OR ITCHING   Items with * indicate a potential emergency and should be followed up as soon as possible or go to the Emergency Department if any problems should occur.  Please show the CHEMOTHERAPY ALERT CARD or IMMUNOTHERAPY ALERT CARD at check-in to  the Emergency Department and triage nurse.  Should you have questions after your visit or need to cancel or reschedule your appointment, please contact [facility]  Dept: [phone]  and follow the prompts.  Office hours are 8:00 a.m. to 4:30 p.m. Monday - Friday. Please note that voicemails left after 4:00 p.m. may not be returned until the following business day.  We are closed weekends and major holidays. You have access to a nurse at all times for urgent questions. Please call the main number to the clinic Dept: [phone] and follow the prompts.   For any non-urgent questions, you may also contact your provider using MyChart. We now offer e-Visits for anyone 9 and older to request care online for non-urgent symptoms. For details visit [email or web address].   Also download the MyChart app! Go to the app store, search "MyChart", open the app, select [facility], and log in with your MyChart username and password.  Due to Covid, a mask is required upon entering the hospital/clinic. If you do not have a mask, one will be given to you upon arrival. For doctor visits, patients may have 1 support person aged 44 or older with them. For treatment visits, patients cannot have anyone with them due to current Covid guidelines and our immunocompromised population.   Hypokalemia Hypokalemia means that the amount of potassium in the blood is lower than normal. Potassium is a chemical (electrolyte) that helps regulate the amount of fluid in the body. It also stimulates muscle tightening (contraction) and helps nerves work properly. Normally, most of the body's potassium is  inside cells, and only a very small amount is in the blood. Because the amount in the blood is so small, minor changes to potassium levels in the blood can be life-threatening. What are the causes? This condition may be caused by: Antibiotic medicine. Diarrhea or vomiting. Taking too much of a medicine  that helps you have a bowel movement (laxative) can cause diarrhea and lead to hypokalemia. Chronic kidney disease (CKD). Medicines that help the body get rid of excess fluid (diuretics). Eating disorders, such as bulimia. Low magnesium levels in the body. Sweating a lot. What are the signs or symptoms? Symptoms of this condition include: Weakness. Constipation. Fatigue. Muscle cramps. Mental confusion. Skipped heartbeats or irregular heartbeat (palpitations). Tingling or numbness. How is this diagnosed? This condition is diagnosed with a blood test. How is this treated? This condition may be treated by: Taking potassium supplements by mouth. Adjusting the medicines that you take. Eating more foods that contain a lot of potassium. If your potassium level is very low, you may need to get potassium through an IV and be monitored in the hospital. Follow these instructions at home:  Take over-the-counter and prescription medicines only as told by your health care provider. This includes vitamins and supplements. Eat a healthy diet. A healthy diet includes fresh fruits and vegetables, whole grains, healthy fats, and lean proteins. If instructed, eat more foods that contain a lot of potassium. This includes: Nuts, such as peanuts and pistachios. Seeds, such as sunflower seeds and pumpkin seeds. Peas, lentils, and lima beans. Whole grain and bran cereals and breads. Fresh fruits and vegetables, such as apricots, avocado, bananas, cantaloupe, kiwi, oranges, tomatoes, asparagus, and potatoes. Orange juice. Tomato juice. Red meats. Yogurt. Keep all follow-up visits as told by your health care provider. This is important. Contact a health care provider if you: Have weakness that gets worse. Feel your heart pounding or racing. Vomit. Have diarrhea. Have diabetes (diabetes mellitus) and you have trouble keeping your blood sugar (glucose) in your target range. Get help right away if  you: Have chest pain. Have shortness of breath. Have vomiting or diarrhea that lasts for more than 2 days. Faint. Summary Hypokalemia means that the amount of potassium in the blood is lower than normal. This condition is diagnosed with a blood test. Hypokalemia may be treated by taking potassium supplements, adjusting the medicines that you take, or eating more foods that are high in potassium. If your potassium level is very low, you may need to get potassium through an IV and be monitored in the hospital. This information is not intended to replace advice given to you by your health care provider. Make sure you discuss any questions you have with your health care provider. Document Revised: 03/31/2018 Document Reviewed: 04/01/2018 Elsevier Patient Education  [place].

## 2021-06-06 NOTE — Progress Notes (Signed)
[facility] OFFICE PROGRESS NOTE  Debra Roers, MD [address] [place] [address]  DIAGNOSIS: Extensive stage (T2a, N3, [name], large right anterior mediastinal and supraclavicular lymphadenopathy as well as pancreatic and splenic metastasis diagnosed in September 2017.  The patient had disease progression in October 2019.  PRIOR THERAPY: 1) Systemic chemotherapy was carboplatin for AUC of 5 on day 1 and etoposide 100 MG/M2 on days 1, 2 and 3 with Neulasta support. Status post 6 cycles with significant response of her disease. 2) Prophylactic cranial irradiation under the care of Dr. Sondra Come on 12/02/2016. 3) stereotactic radiotherapy to the recurrent right upper lobe pulmonary nodule under the care of Dr. Sondra Come completed November 10, 2017. 4) Retreatment with systemic chemotherapy with carboplatin for AUC of 5 on day 1 and etoposide 100 mg/M2 on days 1, 2 and 3 as well as Tecentriq (Atezolizumab) 1200 mg IV every 3 weeks with Neulasta support.  First dose June 22, 2018 for disease recurrence.  Status post 5 cycles.  Starting from cycle #2 her dose of carboplatin will be reduced to AUC of 4 and etoposide 80 mg/M2 on days 1, 2 and 3 in addition to the regular dose of Tecentriq.  CURRENT THERAPY: Maintenance treatment with single agent Tecentriq 1200 mg IV every 3 weeks.  Status post 50 cycles.  INTERVAL HISTORY: Debra Kidd is a 60 y.o. female who returns to the clinic today for a follow-up visit.  She is feeling fairly well today without any concerning complaints except for persistent itching and skin dryness. Other than this she is tolerating her treatment fairly well. For the itching she is prescribed Atarax which she says makes her drowsy and dizzy so she avoids using. She does use hydrocortisone cream which she reports some relief with. Her weight is stable. She continues to struggle with a decreased appetite and  weight loss for which she saw a member the nutritionist team at her last appointment. She reports an aversion to certain foods, she is drinking boost and chocolate milk to supplement her diet. Her next follow-up with nutrition is scheduled for 07/25/21.   She denies any recent fever, chills, night sweats. The patient denies any new nausea, vomiting, or constipation. Reports occasional mild diarrhea that she gets after drinking contrast before her scans or receiving potassium. She reports some mild tenderness around her port, believes it is from her "cat stepping on it." No swelling, erythema, or discharge. She reports her stable baseline dyspnea on exertion she denies any chest pain. Has cough that is sometimes productive, no hemoptysis. She continues to smoke 1.5 packs of cigarettes per day.  She denies any headache and visual changes. She is here today for evaluation before starting cycle #51   MEDICAL HISTORY: Past Medical History:  Diagnosis Date   Basal cell carcinoma of cheek    L side of face   Blood type, Rh positive    Cancer ([facility])    Chronic pain syndrome    Depression 08/07/2016   Encounter for antineoplastic chemotherapy 07/17/2016   History of external beam radiation therapy 11/19/16-12/02/16   brain 25 Gy in 10 fractions   Hyperlipidemia    Hypertension    Hypertension 08/07/2016   Osteoporosis    Small cell lung cancer ([facility]) dx'd 05/2016    ALLERGIES:  is allergic to codeine, motrin [ibuprofen], thiazide-type diuretics, and vicodin [hydrocodone-acetaminophen].  MEDICATIONS:  Current Outpatient Medications  Medication Sig Dispense Refill   clopidogrel (PLAVIX) 75 MG tablet Take 1 tablet (75 mg total) by mouth daily. 30 tablet 0   diazepam (VALIUM) 5 MG tablet Take 5 mg by mouth 3 (three) times daily as needed for anxiety.   2   folic acid (FOLVITE) 1 MG tablet Take 1 tablet (1 mg total) by mouth daily. 30 tablet 1   hydrOXYzine (ATARAX/VISTARIL) 10 MG tablet Take 1 tablet (10  mg total) by mouth 3 (three) times daily as needed. 90 tablet 1   lidocaine-prilocaine (EMLA) cream Apply 1 application topically as needed. 30 g 2   oxyCODONE-acetaminophen (PERCOCET) 5-325 MG tablet Take 1 tablet by mouth every 6 (six) hours as needed for severe pain. 30 tablet 0   prochlorperazine (COMPAZINE) 10 MG tablet Take 1 tablet (10 mg total) by mouth every 6 (six) hours as needed for nausea or vomiting. 30 tablet 2   traMADol (ULTRAM) 50 MG tablet Take 50 mg by mouth 4 (four) times daily as needed for moderate pain (confirmed with her Pharmacy-Dr K. Little 03/15 she received #120).     potassium chloride 20 MEQ/15ML (10%) SOLN Take 15 mLs (20 mEq total) by mouth daily. 150 mL 0   No current facility-administered medications for this visit.   Facility-Administered Medications Ordered in Other Visits  Medication Dose Route Frequency Provider Last Rate Last Admin   atezolizumab (TECENTRIQ) 1,200 mg in sodium chloride 0.9 % 250 mL chemo infusion  1,200 mg Intravenous Once [name], MD       heparin lock flush 100 unit/mL  500 Units Intracatheter Once PRN [name], MD       potassium chloride 10 mEq in 100 mL IVPB  10 mEq Intravenous Q1 Hr x 2 Breuna Loveall L, PA-C       sodium chloride flush (NS) 0.9 % injection 10 mL  10 mL Intracatheter PRN [name], MD        SURGICAL HISTORY:  Past Surgical History:  Procedure Laterality Date   ABDOMINAL HYSTERECTOMY  31   APPENDECTOMY     at age 3   [place]     left   IR GENERIC HISTORICAL  06/03/2016   IR FLUORO GUIDE PORT INSERTION RIGHT 06/03/2016 [facility]INTER[facility] RAD   IR GENERIC HISTORICAL  06/03/2016   IR US GUIDE VASC ACCESS RIGHT 06/03/2016 [facility]   SKIN CANCER EXCISION     basal cell carcinoma L side of face    REVIEW OF SYSTEMS:   Review of Systems  Constitutional: Positive for fatigue, weight loss, and decreased appetite. Negative for chills and fever. HENT: Negative for mouth sores,  nosebleeds, sore throat and trouble swallowing.   Eyes: Positive for itchy watery eyes. Negative for icterus. Respiratory: Positive for baseline cough and shortness of breath with exertion. Negative for hemoptysis and wheezing.   Cardiovascular:  Negative for chest pain and leg swelling. Gastrointestinal: Negative for abdominal pain, and vomiting. Genitourinary: Negative for bladder incontinence, difficulty urinating, dysuria, frequency and hematuria.   Musculoskeletal: Negative for gait problem, neck pain and neck stiffness. Skin: Positive for itching. Negative for rash. Neurological: Lower extremity weakness, gradually worsening L>R due to prior stoke. Negative for dizziness, light-headedness and seizures. Hematological: Negative for adenopathy. Does not bruise/bleed easily. Psychiatric/Behavioral: Negative for confusion, depression and sleep disturbance. The patient is not nervous/anxious    PHYSICAL EXAMINATION:  Blood pressure 98/72, pulse 87, temperature (!) 96.3 F (35.7 C), temperature source Tympanic, resp. rate 18, height 5\' 3"  (  1.6 m), weight 111 lb 3.2 oz (50.4 kg), SpO2 100 %.  ECOG PERFORMANCE STATUS: 2-3  Physical Exam  Constitutional: Oriented to person, place, and time and chronically ill appearing female and in no distress. HENT: Head: Normocephalic and atraumatic. Mouth/Throat: Oropharynx is clear and moist. No oropharyngeal exudate. Eyes: Conjunctivae are normal. Right eye exhibits no discharge. Left eye exhibits no discharge. No scleral icterus. Neck: Normal range of motion. Neck supple. Cardiovascular: Normal rate, regular rhythm, normal heart sounds and intact distal pulses.   Pulmonary/Chest: Effort normal.  Quiet breath sounds in all lung fields.  No respiratory distress. No wheezes. No rales. Abdominal: Soft. Bowel sounds are normal. Exhibits no distension and no mass. There is no tenderness.  Musculoskeletal: Normal range of motion. Exhibits no edema.   Lymphadenopathy:    No cervical adenopathy.  Neurological: Alert and oriented to person, place, and time. Exhibits muscle wasting. Examined in the wheelchair.  Skin: Skin is warm and dry. No rash noted. Not diaphoretic. No erythema. No pallor.  Psychiatric: Mood, memory and judgment normal. Vitals reviewed.  LABORATORY DATA: Lab Results  Component Value Date   WBC 7.0 06/13/2021   HGB 11.4 (L) 06/13/2021   HCT 34.2 (L) 06/13/2021   MCV 103.3 (H) 06/13/2021   PLT 264 06/13/2021      Chemistry      Component Value Date/Time   NA 142 06/13/2021 0935   NA 141 08/01/2017 0835   K 2.8 (L) 06/13/2021 0935   K 3.5 08/01/2017 0835   CL 106 06/13/2021 0935   CO2 26 06/13/2021 0935   CO2 25 08/01/2017 0835   BUN <5 (L) 06/13/2021 0935   BUN 8.0 08/01/2017 0835   CREATININE 0.78 06/13/2021 0935   CREATININE 0.9 08/01/2017 0835      Component Value Date/Time   CALCIUM 9.0 06/13/2021 0935   CALCIUM 9.9 08/01/2017 0835   ALKPHOS 88 06/13/2021 0935   ALKPHOS 142 08/01/2017 0835   AST 21 06/13/2021 0935   AST 15 08/01/2017 0835   ALT 12 06/13/2021 0935   ALT 19 08/01/2017 0835   BILITOT 0.2 (L) 06/13/2021 0935   BILITOT 0.35 08/01/2017 0835       RADIOGRAPHIC STUDIES:  No results found.   ASSESSMENT/PLAN:  This is a very pleasant 60 year old Caucasian female with extensive stage small cell lung cancer.  She presented with a right upper lobe lung mass, large right anterior mediastinal and supraclavicular lymphadenopathy as well as a pancreatic and splenic metastasis.  She was diagnosed in September 2017.   The patient underwent systemic chemotherapy with carboplatin and etoposide.  She is status post 6 cycles.  She tolerated treatment well except for chemotherapy-induced anemia which required  pRBCs and platelet transfusions.  She had a significant improvement of her disease with chemotherapy. The patient then underwent prophylactic cranial irradiation. She then underwent  stereotactic radiotherapy to the right upper lobe and pulmonary nodule.   She had been on observation for 2 years before showing evidence of disease progression.   She then was started on systemic chemotherapy with carboplatin, etoposide, and Tecentriq. Starting from cycle #5, she has been on maintenance single agent Tecentriq.  She has been tolerating treatment well. She is status post 50 cycles of Tecentriq total.  Labs are reviewed. Recommend that she proceed with cycle #51 today scheduled.  We will see her back for a follow-up visit in 3 weeks for evaluation before starting cycle #52 and to review her scan results.  For dry  skin and itching, recommended continuing with cortisone cream and moisturizing lotions as necessary. Recommended taking Benadryl instead of Atarax as patient has been experiencing some side effects with the medication. Patient advised that Benadryl could also cause some drowsiness but she states she tolerates this well. Will continue to monitor. She also received Amlactin lotion today.   Her potassium is low. We will arrange for her to receive 20 meq of IV potassium in the clinic today. I also will refill her liquid potassium to her pharmacy with 20 meq for 10 days.   Patient encouraged to continue with boost to supplement diet, will continue to monitor for weight loss. Patient has follow up with nutrition on 07/25/21.   The patient was advised to call immediately if she has any concerning symptoms in the interval. The patient voices understanding of current disease status and treatment options and is in agreement with the current care plan. All questions were answered. The patient knows to call the clinic with any problems, questions or concerns. We can certainly see the patient much sooner if necessary      No orders of the defined types were placed in this encounter.    The total time spent in the appointment was 20-29 minutes   Mabel Unrein L Briley Bumgarner,  PA-C 06/13/21

## 2021-06-13 ENCOUNTER — Other Ambulatory Visit: Payer: Self-pay

## 2021-06-13 ENCOUNTER — Inpatient Hospital Stay: Payer: Medicare Other | Attending: Internal Medicine | Admitting: Physician Assistant

## 2021-06-13 ENCOUNTER — Encounter: Payer: Self-pay | Admitting: Physician Assistant

## 2021-06-13 ENCOUNTER — Inpatient Hospital Stay: Payer: Medicare Other

## 2021-06-13 VITALS — BP 98/72 | HR 87 | Temp 96.3°F | Resp 18 | Ht 63.0 in | Wt 111.2 lb

## 2021-06-13 DIAGNOSIS — G894 Chronic pain syndrome: Secondary | ICD-10-CM | POA: Diagnosis not present

## 2021-06-13 DIAGNOSIS — C3491 Malignant neoplasm of unspecified part of right bronchus or lung: Secondary | ICD-10-CM | POA: Diagnosis not present

## 2021-06-13 DIAGNOSIS — E785 Hyperlipidemia, unspecified: Secondary | ICD-10-CM | POA: Diagnosis not present

## 2021-06-13 DIAGNOSIS — I10 Essential (primary) hypertension: Secondary | ICD-10-CM | POA: Diagnosis not present

## 2021-06-13 DIAGNOSIS — C7889 Secondary malignant neoplasm of other digestive organs: Secondary | ICD-10-CM | POA: Insufficient documentation

## 2021-06-13 DIAGNOSIS — Z79899 Other long term (current) drug therapy: Secondary | ICD-10-CM | POA: Diagnosis not present

## 2021-06-13 DIAGNOSIS — R5383 Other fatigue: Secondary | ICD-10-CM

## 2021-06-13 DIAGNOSIS — E876 Hypokalemia: Secondary | ICD-10-CM

## 2021-06-13 DIAGNOSIS — Z9221 Personal history of antineoplastic chemotherapy: Secondary | ICD-10-CM | POA: Insufficient documentation

## 2021-06-13 DIAGNOSIS — Z5112 Encounter for antineoplastic immunotherapy: Secondary | ICD-10-CM | POA: Diagnosis not present

## 2021-06-13 DIAGNOSIS — C3411 Malignant neoplasm of upper lobe, right bronchus or lung: Secondary | ICD-10-CM | POA: Diagnosis not present

## 2021-06-13 DIAGNOSIS — F17210 Nicotine dependence, cigarettes, uncomplicated: Secondary | ICD-10-CM | POA: Diagnosis not present

## 2021-06-13 DIAGNOSIS — Z85828 Personal history of other malignant neoplasm of skin: Secondary | ICD-10-CM | POA: Diagnosis not present

## 2021-06-13 LAB — CBC WITH DIFFERENTIAL (CANCER CENTER ONLY)
Abs Immature Granulocytes: 0.02 10*3/uL (ref 0.00–0.07)
Basophils Absolute: 0 10*3/uL (ref 0.0–0.1)
Basophils Relative: 0 %
Eosinophils Absolute: 0.3 10*3/uL (ref 0.0–0.5)
Eosinophils Relative: 4 %
HCT: 34.2 % — ABNORMAL LOW (ref 36.0–46.0)
Hemoglobin: 11.4 g/dL — ABNORMAL LOW (ref 12.0–15.0)
Immature Granulocytes: 0 %
Lymphocytes Relative: 26 %
Lymphs Abs: 1.8 10*3/uL (ref 0.7–4.0)
MCH: 34.4 pg — ABNORMAL HIGH (ref 26.0–34.0)
MCHC: 33.3 g/dL (ref 30.0–36.0)
MCV: 103.3 fL — ABNORMAL HIGH (ref 80.0–100.0)
Monocytes Absolute: 0.4 10*3/uL (ref 0.1–1.0)
Monocytes Relative: 5 %
Neutro Abs: 4.5 10*3/uL (ref 1.7–7.7)
Neutrophils Relative %: 65 %
Platelet Count: 264 10*3/uL (ref 150–400)
RBC: 3.31 MIL/uL — ABNORMAL LOW (ref 3.87–5.11)
RDW: 11.5 % (ref 11.5–15.5)
WBC Count: 7 10*3/uL (ref 4.0–10.5)
nRBC: 0 % (ref 0.0–0.2)

## 2021-06-13 LAB — CMP (CANCER CENTER ONLY)
ALT: 12 U/L (ref 0–44)
AST: 21 U/L (ref 15–41)
Albumin: 3.2 g/dL — ABNORMAL LOW (ref 3.5–5.0)
Alkaline Phosphatase: 88 U/L (ref 38–126)
Anion gap: 10 (ref 5–15)
BUN: <5 mg/dL — ABNORMAL LOW (ref 8–23)
CO2: 26 mmol/L (ref 22–32)
Calcium: 9 mg/dL (ref 8.9–10.3)
Chloride: 106 mmol/L (ref 98–111)
Creatinine: 0.78 mg/dL (ref 0.44–1.00)
GFR, Estimated: >60 mL/min (ref >60)
Glucose, Bld: 133 mg/dL — ABNORMAL HIGH (ref 70–99)
Potassium: 2.8 mmol/L — ABNORMAL LOW (ref 3.5–5.1)
Sodium: 142 mmol/L (ref 135–145)
Total Bilirubin: 0.2 mg/dL — ABNORMAL LOW (ref 0.3–1.2)
Total Protein: 6.5 g/dL (ref 6.5–8.1)

## 2021-06-13 LAB — TSH: TSH: 2.258 u[IU]/mL (ref 0.308–3.960)

## 2021-06-13 MED ORDER — SODIUM CHLORIDE 0.9 % IV SOLN
1200.0000 mg | Freq: Once | INTRAVENOUS | Status: AC
  Administered 2021-06-13: 1200 mg via INTRAVENOUS
  Filled 2021-06-13: qty 20

## 2021-06-13 MED ORDER — HEPARIN SOD (PORK) LOCK FLUSH 100 UNIT/ML IV SOLN
500.0000 [IU] | Freq: Once | INTRAVENOUS | Status: AC | PRN
  Administered 2021-06-13: 500 [IU]

## 2021-06-13 MED ORDER — POTASSIUM CHLORIDE 20 MEQ/15ML (10%) PO SOLN: 20.0000 meq | Freq: Every day | ORAL | 0 refills | Status: DC

## 2021-06-13 MED ORDER — POTASSIUM CHLORIDE 10 MEQ/100ML IV SOLN
10.0000 meq | INTRAVENOUS | Status: AC
  Administered 2021-06-13 (×2): 10 meq via INTRAVENOUS
  Filled 2021-06-13 (×2): qty 100

## 2021-06-13 MED ORDER — SODIUM CHLORIDE 0.9 % IV SOLN: Freq: Once | INTRAVENOUS | Status: AC

## 2021-06-13 MED ORDER — SODIUM CHLORIDE 0.9% FLUSH
10.0000 mL | INTRAVENOUS | Status: DC | PRN
  Administered 2021-06-13: 10 mL

## 2021-06-13 NOTE — Patient Instructions (Signed)
Steps to Quit Smoking Smoking tobacco is the leading cause of preventable death. It can affect almost every organ in the body. Smoking puts you and people around you at risk for many serious, long-lasting (chronic) diseases. Quitting smoking can be hard, but it is one of the best things that you can do for your health. It is never too late to quit. How do I get ready to quit? When you decide to quit smoking, make a plan to help you succeed. Before you quit: Pick a date to quit. Set a date within the next 2 weeks to give you time to prepare. Write down the reasons why you are quitting. Keep this list in places where you will see it often. Tell your family, friends, and co-workers that you are quitting. Their support is important. Talk with your doctor about the choices that may help you quit. Find out if your health insurance will pay for these treatments. Know the people, places, things, and activities that make you want to smoke (triggers). Avoid them. What first steps can I take to quit smoking? Throw away all cigarettes at home, at work, and in your car. Throw away the things that you use when you smoke, such as ashtrays and lighters. Clean your car. Make sure to empty the ashtray. Clean your home, including curtains and carpets. What can I do to help me quit smoking? Talk with your doctor about taking medicines and seeing a counselor at the same time. You are more likely to succeed when you do both. If you are pregnant or breastfeeding, talk with your doctor about counseling or other ways to quit smoking. Do not take medicine to help you quit smoking unless your doctor tells you to do so. To quit smoking: Quit right away Quit smoking totally, instead of slowly cutting back on how much you smoke over a period of time. Go to counseling. You are more likely to quit if you go to counseling sessions regularly. Take medicine You may take medicines to help you quit. Some medicines need a  prescription, and some you can buy over-the-counter. Some medicines may contain a drug called nicotine to replace the nicotine in cigarettes. Medicines may: Help you to stop having the desire to smoke (cravings). Help to stop the problems that come when you stop smoking (withdrawal symptoms). Your doctor may ask you to use: Nicotine patches, gum, or lozenges. Nicotine inhalers or sprays. Non-nicotine medicine that is taken by mouth. Find resources Find resources and other ways to help you quit smoking and remain smoke-free after you quit. These resources are most helpful when you use them often. They include: Online chats with a counselor. Phone quitlines. Printed self-help materials. Support groups or group counseling. Text messaging programs. Mobile phone apps. Use apps on your mobile phone or tablet that can help you stick to your quit plan. There are many free apps for mobile phones and tablets as well as websites. Examples include Quit Guide from the CDC and smokefree.gov  What things can I do to make it easier to quit?  Talk to your family and friends. Ask them to support and encourage you. Call a phone quitline (1-800-QUIT-NOW), reach out to support groups, or work with a counselor. Ask people who smoke to not smoke around you. Avoid places that make you want to smoke, such as: Bars. Parties. Smoke-break areas at work. Spend time with people who do not smoke. Lower the stress in your life. Stress can make you want to   smoke. Try these things to help your stress: Getting regular exercise. Doing deep-breathing exercises. Doing yoga. Meditating. Doing a body scan. To do this, close your eyes, focus on one area of your body at a time from head to toe. Notice which parts of your body are tense. Try to relax the muscles in those areas. How will I feel when I quit smoking? Day 1 to 3 weeks Within the first 24 hours, you may start to have some problems that come from quitting tobacco.  These problems are very bad 2-3 days after you quit, but they do not often last for more than 2-3 weeks. You may get these symptoms: Mood swings. Feeling restless, nervous, angry, or annoyed. Trouble concentrating. Dizziness. Strong desire for high-sugar foods and nicotine. Weight gain. Trouble pooping (constipation). Feeling like you may vomit (nausea). Coughing or a sore throat. Changes in how the medicines that you take for other issues work in your body. Depression. Trouble sleeping (insomnia). Week 3 and afterward After the first 2-3 weeks of quitting, you may start to notice more positive results, such as: Better sense of smell and taste. Less coughing and sore throat. Slower heart rate. Lower blood pressure. Clearer skin. Better breathing. Fewer sick days. Quitting smoking can be hard. Do not give up if you fail the first time. Some people need to try a few times before they succeed. Do your best to stick to your quit plan, and talk with your doctor if you have any questions or concerns. Summary Smoking tobacco is the leading cause of preventable death. Quitting smoking can be hard, but it is one of the best things that you can do for your health. When you decide to quit smoking, make a plan to help you succeed. Quit smoking right away, not slowly over a period of time. When you start quitting, seek help from your doctor, family, or friends. This information is not intended to replace advice given to you by your health care provider. Make sure you discuss any questions you have with your health care provider. Document Revised: 05/14/2019 Document Reviewed: 11/07/2018 Elsevier Patient Education  2022 Elsevier Inc.  

## 2021-06-13 NOTE — Patient Instructions (Signed)
[facility]  Discharge Instructions: Thank you for choosing [facility] to provide your oncology and hematology care.   If you have a lab appointment with the [facility], please go directly to the [facility] and check in at the registration area.   Wear comfortable clothing and clothing appropriate for easy access to any Portacath or PICC line.   We strive to give you quality time with your provider. You may need to reschedule your appointment if you arrive late (15 or more minutes).  Arriving late affects you and other patients whose appointments are after yours.  Also, if you miss three or more appointments without notifying the office, you may be dismissed from the clinic at the provider's discretion.      For prescription refill requests, have your pharmacy contact our office and allow 72 hours for refills to be completed.    Today you received the following chemotherapy and/or immunotherapy agents tecentriq      To help prevent nausea and vomiting after your treatment, we encourage you to take your nausea medication as directed.  BELOW ARE SYMPTOMS THAT SHOULD BE REPORTED IMMEDIATELY: *FEVER GREATER THAN 100.4 F (38 C) OR HIGHER *CHILLS OR SWEATING *NAUSEA AND VOMITING THAT IS NOT CONTROLLED WITH YOUR NAUSEA MEDICATION *UNUSUAL SHORTNESS OF BREATH *UNUSUAL BRUISING OR BLEEDING *URINARY PROBLEMS (pain or burning when urinating, or frequent urination) *BOWEL PROBLEMS (unusual diarrhea, constipation, pain near the anus) TENDERNESS IN MOUTH AND THROAT WITH OR WITHOUT PRESENCE OF ULCERS (sore throat, sores in mouth, or a toothache) UNUSUAL RASH, SWELLING OR PAIN  UNUSUAL VAGINAL DISCHARGE OR ITCHING   Items with * indicate a potential emergency and should be followed up as soon as possible or go to the Emergency Department if any problems should occur.  Please show the CHEMOTHERAPY ALERT CARD or IMMUNOTHERAPY ALERT CARD at check-in to  the Emergency Department and triage nurse.  Should you have questions after your visit or need to cancel or reschedule your appointment, please contact [facility]  Dept: [phone]  and follow the prompts.  Office hours are 8:00 a.m. to 4:30 p.m. Monday - Friday. Please note that voicemails left after 4:00 p.m. may not be returned until the following business day.  We are closed weekends and major holidays. You have access to a nurse at all times for urgent questions. Please call the main number to the clinic Dept: [phone] and follow the prompts.   For any non-urgent questions, you may also contact your provider using MyChart. We now offer e-Visits for anyone 62 and older to request care online for non-urgent symptoms. For details visit [email or web address].   Also download the MyChart app! Go to the app store, search "MyChart", open the app, select [facility], and log in with your MyChart username and password.  Due to Covid, a mask is required upon entering the hospital/clinic. If you do not have a mask, one will be given to you upon arrival. For doctor visits, patients may have 1 support person aged 46 or older with them. For treatment visits, patients cannot have anyone with them due to current Covid guidelines and our immunocompromised population.

## 2021-06-20 ENCOUNTER — Other Ambulatory Visit: Payer: Medicare Other

## 2021-06-20 ENCOUNTER — Ambulatory Visit: Payer: Medicare Other | Admitting: Internal Medicine

## 2021-06-20 ENCOUNTER — Ambulatory Visit: Payer: Medicare Other

## 2021-07-04 ENCOUNTER — Other Ambulatory Visit: Payer: Self-pay

## 2021-07-04 ENCOUNTER — Inpatient Hospital Stay: Payer: Medicare Other

## 2021-07-04 ENCOUNTER — Inpatient Hospital Stay: Payer: Medicare Other | Attending: Internal Medicine

## 2021-07-04 ENCOUNTER — Inpatient Hospital Stay (HOSPITAL_BASED_OUTPATIENT_CLINIC_OR_DEPARTMENT_OTHER): Payer: Medicare Other | Admitting: Internal Medicine

## 2021-07-04 ENCOUNTER — Other Ambulatory Visit: Payer: Self-pay | Admitting: Medical Oncology

## 2021-07-04 VITALS — BP 91/70 | HR 89 | Temp 96.4°F | Resp 18 | Ht 63.0 in | Wt 110.5 lb

## 2021-07-04 DIAGNOSIS — C3491 Malignant neoplasm of unspecified part of right bronchus or lung: Secondary | ICD-10-CM | POA: Diagnosis not present

## 2021-07-04 DIAGNOSIS — Z5112 Encounter for antineoplastic immunotherapy: Secondary | ICD-10-CM | POA: Insufficient documentation

## 2021-07-04 DIAGNOSIS — G894 Chronic pain syndrome: Secondary | ICD-10-CM | POA: Diagnosis not present

## 2021-07-04 DIAGNOSIS — I10 Essential (primary) hypertension: Secondary | ICD-10-CM | POA: Insufficient documentation

## 2021-07-04 DIAGNOSIS — Z23 Encounter for immunization: Secondary | ICD-10-CM | POA: Insufficient documentation

## 2021-07-04 DIAGNOSIS — Z79899 Other long term (current) drug therapy: Secondary | ICD-10-CM | POA: Insufficient documentation

## 2021-07-04 DIAGNOSIS — M810 Age-related osteoporosis without current pathological fracture: Secondary | ICD-10-CM | POA: Diagnosis not present

## 2021-07-04 DIAGNOSIS — Z9221 Personal history of antineoplastic chemotherapy: Secondary | ICD-10-CM | POA: Diagnosis not present

## 2021-07-04 DIAGNOSIS — E785 Hyperlipidemia, unspecified: Secondary | ICD-10-CM | POA: Insufficient documentation

## 2021-07-04 DIAGNOSIS — M549 Dorsalgia, unspecified: Secondary | ICD-10-CM | POA: Diagnosis not present

## 2021-07-04 DIAGNOSIS — R5383 Other fatigue: Secondary | ICD-10-CM

## 2021-07-04 DIAGNOSIS — Z85828 Personal history of other malignant neoplasm of skin: Secondary | ICD-10-CM | POA: Insufficient documentation

## 2021-07-04 DIAGNOSIS — C3411 Malignant neoplasm of upper lobe, right bronchus or lung: Secondary | ICD-10-CM | POA: Diagnosis not present

## 2021-07-04 DIAGNOSIS — L299 Pruritus, unspecified: Secondary | ICD-10-CM | POA: Diagnosis not present

## 2021-07-04 LAB — CBC WITH DIFFERENTIAL (CANCER CENTER ONLY)
Abs Immature Granulocytes: 0.02 10*3/uL (ref 0.00–0.07)
Basophils Absolute: 0 10*3/uL (ref 0.0–0.1)
Basophils Relative: 0 %
Eosinophils Absolute: 0.2 10*3/uL (ref 0.0–0.5)
Eosinophils Relative: 3 %
HCT: 35.6 % — ABNORMAL LOW (ref 36.0–46.0)
Hemoglobin: 12.1 g/dL (ref 12.0–15.0)
Immature Granulocytes: 0 %
Lymphocytes Relative: 24 %
Lymphs Abs: 1.8 10*3/uL (ref 0.7–4.0)
MCH: 33.7 pg (ref 26.0–34.0)
MCHC: 34 g/dL (ref 30.0–36.0)
MCV: 99.2 fL (ref 80.0–100.0)
Monocytes Absolute: 0.4 10*3/uL (ref 0.1–1.0)
Monocytes Relative: 5 %
Neutro Abs: 5.1 10*3/uL (ref 1.7–7.7)
Neutrophils Relative %: 68 %
Platelet Count: 249 10*3/uL (ref 150–400)
RBC: 3.59 MIL/uL — ABNORMAL LOW (ref 3.87–5.11)
RDW: 11.3 % — ABNORMAL LOW (ref 11.5–15.5)
WBC Count: 7.5 10*3/uL (ref 4.0–10.5)
nRBC: 0 % (ref 0.0–0.2)

## 2021-07-04 LAB — CMP (CANCER CENTER ONLY)
ALT: 8 U/L (ref 0–44)
AST: 12 U/L — ABNORMAL LOW (ref 15–41)
Albumin: 3 g/dL — ABNORMAL LOW (ref 3.5–5.0)
Alkaline Phosphatase: 116 U/L (ref 38–126)
Anion gap: 12 (ref 5–15)
BUN: <4 mg/dL — ABNORMAL LOW (ref 8–23)
CO2: 24 mmol/L (ref 22–32)
Calcium: 8.4 mg/dL — ABNORMAL LOW (ref 8.9–10.3)
Chloride: 103 mmol/L (ref 98–111)
Creatinine: 0.74 mg/dL (ref 0.44–1.00)
GFR, Estimated: >60 mL/min (ref >60)
Glucose, Bld: 113 mg/dL — ABNORMAL HIGH (ref 70–99)
Potassium: 3.3 mmol/L — ABNORMAL LOW (ref 3.5–5.1)
Sodium: 139 mmol/L (ref 135–145)
Total Bilirubin: 0.3 mg/dL (ref 0.3–1.2)
Total Protein: 6.2 g/dL — ABNORMAL LOW (ref 6.5–8.1)

## 2021-07-04 LAB — TSH: TSH: 1.571 u[IU]/mL (ref 0.308–3.960)

## 2021-07-04 MED ORDER — SODIUM CHLORIDE 0.9% FLUSH
10.0000 mL | INTRAVENOUS | Status: DC | PRN
  Administered 2021-07-04: 10 mL

## 2021-07-04 MED ORDER — INFLUENZA VAC SPLIT QUAD 0.5 ML IM SUSY
0.5000 mL | PREFILLED_SYRINGE | Freq: Once | INTRAMUSCULAR | Status: AC
  Administered 2021-07-04: 0.5 mL via INTRAMUSCULAR
  Filled 2021-07-04: qty 0.5

## 2021-07-04 MED ORDER — SODIUM CHLORIDE 0.9 % IV SOLN
1200.0000 mg | Freq: Once | INTRAVENOUS | Status: AC
  Administered 2021-07-04: 1200 mg via INTRAVENOUS
  Filled 2021-07-04: qty 20

## 2021-07-04 MED ORDER — SODIUM CHLORIDE 0.9 % IV SOLN: Freq: Once | INTRAVENOUS | Status: AC

## 2021-07-04 MED ORDER — HEPARIN SOD (PORK) LOCK FLUSH 100 UNIT/ML IV SOLN
500.0000 [IU] | Freq: Once | INTRAVENOUS | Status: AC | PRN
  Administered 2021-07-04: 500 [IU]

## 2021-07-04 NOTE — Patient Instructions (Signed)
[facility]  Discharge Instructions: Thank you for choosing [facility] to provide your oncology and hematology care.   If you have a lab appointment with the [facility], please go directly to the [facility] and check in at the registration area.   Wear comfortable clothing and clothing appropriate for easy access to any Portacath or PICC line.   We strive to give you quality time with your provider. You may need to reschedule your appointment if you arrive late (15 or more minutes).  Arriving late affects you and other patients whose appointments are after yours.  Also, if you miss three or more appointments without notifying the office, you may be dismissed from the clinic at the provider's discretion.      For prescription refill requests, have your pharmacy contact our office and allow 72 hours for refills to be completed.    Today you received the following chemotherapy and/or immunotherapy agents tecentriq      To help prevent nausea and vomiting after your treatment, we encourage you to take your nausea medication as directed.  BELOW ARE SYMPTOMS THAT SHOULD BE REPORTED IMMEDIATELY: *FEVER GREATER THAN 100.4 F (38 C) OR HIGHER *CHILLS OR SWEATING *NAUSEA AND VOMITING THAT IS NOT CONTROLLED WITH YOUR NAUSEA MEDICATION *UNUSUAL SHORTNESS OF BREATH *UNUSUAL BRUISING OR BLEEDING *URINARY PROBLEMS (pain or burning when urinating, or frequent urination) *BOWEL PROBLEMS (unusual diarrhea, constipation, pain near the anus) TENDERNESS IN MOUTH AND THROAT WITH OR WITHOUT PRESENCE OF ULCERS (sore throat, sores in mouth, or a toothache) UNUSUAL RASH, SWELLING OR PAIN  UNUSUAL VAGINAL DISCHARGE OR ITCHING   Items with * indicate a potential emergency and should be followed up as soon as possible or go to the Emergency Department if any problems should occur.  Please show the CHEMOTHERAPY ALERT CARD or IMMUNOTHERAPY ALERT CARD at check-in to  the Emergency Department and triage nurse.  Should you have questions after your visit or need to cancel or reschedule your appointment, please contact [facility]  Dept: [phone]  and follow the prompts.  Office hours are 8:00 a.m. to 4:30 p.m. Monday - Friday. Please note that voicemails left after 4:00 p.m. may not be returned until the following business day.  We are closed weekends and major holidays. You have access to a nurse at all times for urgent questions. Please call the main number to the clinic Dept: [phone] and follow the prompts.   For any non-urgent questions, you may also contact your provider using MyChart. We now offer e-Visits for anyone 23 and older to request care online for non-urgent symptoms. For details visit [email or web address].   Also download the MyChart app! Go to the app store, search "MyChart", open the app, select [facility], and log in with your MyChart username and password.  Due to Covid, a mask is required upon entering the hospital/clinic. If you do not have a mask, one will be given to you upon arrival. For doctor visits, patients may have 1 support person aged 37 or older with them. For treatment visits, patients cannot have anyone with them due to current Covid guidelines and our immunocompromised population.   Influenza Virus Vaccine injection What is this medication? INFLUENZA VIRUS VACCINE (in floo EN zuh VAHY ruhs vak SEEN) helps to reduce the risk of getting influenza also known as the flu. The vaccine only helps protect you against some strains of the flu. This medicine may be used  for other purposes; ask your health care provider or pharmacist if you have questions. COMMON BRAND NAME(S): Afluria, Afluria Quadrivalent, Agriflu, Alfuria, FLUAD, FLUAD Quadrivalent, Fluarix, Fluarix Quadrivalent, Flublok, Flublok Quadrivalent, FLUCELVAX, FLUCELVAX Quadrivalent, Flulaval, Flulaval Quadrivalent, Fluvirin, Fluzone,  Fluzone High-Dose, Fluzone Intradermal, Fluzone Quadrivalent What should I tell my care team before I take this medication? They need to know if you have any of these conditions: bleeding disorder like hemophilia fever or infection Guillain-Barre syndrome or other neurological problems immune system problems infection with the human immunodeficiency virus (HIV) or AIDS low blood platelet counts multiple sclerosis an unusual or allergic reaction to influenza virus vaccine, latex, other medicines, foods, dyes, or preservatives. Different brands of vaccines contain different allergens. Some may contain latex or eggs. Talk to your doctor about your allergies to make sure that you get the right vaccine. pregnant or trying to get pregnant breast-feeding How should I use this medication? This vaccine is for injection into a muscle or under the skin. It is given by a health care professional. A copy of Vaccine Information Statements will be given before each vaccination. Read this sheet carefully each time. The sheet may change frequently. Talk to your healthcare provider to see which vaccines are right for you. Some vaccines should not be used in all age groups. Overdosage: If you think you have taken too much of this medicine contact a poison control center or emergency room at once. NOTE: This medicine is only for you. Do not share this medicine with others. What if I miss a dose? This does not apply. What may interact with this medication? chemotherapy or radiation therapy medicines that lower your immune system like etanercept, anakinra, infliximab, and adalimumab medicines that treat or prevent blood clots like warfarin phenytoin steroid medicines like prednisone or cortisone theophylline vaccines This list may not describe all possible interactions. Give your health care provider a list of all the medicines, herbs, non-prescription drugs, or dietary supplements you use. Also tell them if  you smoke, drink alcohol, or use illegal drugs. Some items may interact with your medicine. What should I watch for while using this medication? Report any side effects that do not go away within 3 days to your doctor or health care professional. Call your health care provider if any unusual symptoms occur within 6 weeks of receiving this vaccine. You may still catch the flu, but the illness is not usually as bad. You cannot get the flu from the vaccine. The vaccine will not protect against colds or other illnesses that may cause fever. The vaccine is needed every year. What side effects may I notice from receiving this medication? Side effects that you should report to your doctor or health care professional as soon as possible: allergic reactions like skin rash, itching or hives, swelling of the face, lips, or tongue Side effects that usually do not require medical attention (report to your doctor or health care professional if they continue or are bothersome): fever headache muscle aches and pains pain, tenderness, redness, or swelling at the injection site tiredness Side effects that you should report to your doctor or health care professional as soon as possible: allergic reactions like skin rash, itching or hives, swelling of the face, lips, or tongue Side effects that usually do not require medical attention (report to your doctor or health care professional if they continue or are bothersome): fever headache muscle aches and pains pain, tenderness, redness, or swelling at the injection site tiredness This list may not  describe all possible side effects. Call your doctor for medical advice about side effects. You may report side effects to FDA at 1-800-FDA-1088. Where should I keep my medication? The vaccine will be given by a health care professional in a clinic, pharmacy, doctor's office, or other health care setting. You will not be given vaccine doses to store at home. NOTE: This  sheet is a summary. It may not cover all possible information. If you have questions about this medicine, talk to your doctor, pharmacist, or health care provider.  2022 Elsevier/Gold Standard (2020-04-25 19:49:22)

## 2021-07-04 NOTE — Progress Notes (Signed)
[facility] [phone]   [phone]  OFFICE PROGRESS NOTE  [name], MD [address] [place] [address]  DIAGNOSIS: Extensive stage (T2a, N3, [name], large right anterior mediastinal and supraclavicular lymphadenopathy as well as pancreatic and splenic metastasis diagnosed in September 2017.  The patient had disease progression in October 2019.  PRIOR THERAPY:  1) Systemic chemotherapy was carboplatin for AUC of 5 on day 1 and etoposide 100 MG/M2 on days 1, 2 and 3 with Neulasta support. Status post 6 cycles with significant response of her disease. 2) Prophylactic cranial irradiation under the care of Dr. Sondra Come on 12/02/2016. 3) stereotactic radiotherapy to the recurrent right upper lobe pulmonary nodule under the care of Dr. Sondra Come completed November 10, 2017. 4) Retreatment with systemic chemotherapy with carboplatin for AUC of 5 on day 1 and etoposide 100 mg/M2 on days 1, 2 and 3 as well as Tecentriq (Atezolizumab) 1200 mg IV every 3 weeks with Neulasta support.  First dose June 22, 2018 for disease recurrence.  Status post 5 cycles.  Starting from cycle #2 her dose of carboplatin will be reduced to AUC of 4 and etoposide 80 mg/M2 on days 1, 2 and 3 in addition to the regular dose of Tecentriq.  CURRENT THERAPY: Maintenance treatment with single agent Tecentriq 1200 mg IV every 3 weeks.  Status post 46 cycles.  INTERVAL HISTORY: Debra Kidd 60 y.o. female returns to the clinic today for follow-up visit.  The patient is feeling fine today with no concerning complaints.  She denied having any current chest pain but has shortness of breath with exertion with no cough or hemoptysis.  She denied having any fever or chills.  She has no nausea, vomiting, diarrhea or constipation.  She has no headache or visual changes.  She denied having any significant fever or chills.  She lost few  pounds recently.  She continues to tolerate her maintenance treatment with Tecentriq fairly well.  The patient is here today for evaluation before starting cycle #52 of her treatment.   MEDICAL HISTORY: Past Medical History:  Diagnosis Date   Basal cell carcinoma of cheek    L side of face   Blood type, Rh positive    Cancer (HCC)    Chronic pain syndrome    Depression 08/07/2016   Encounter for antineoplastic chemotherapy 07/17/2016   History of external beam radiation therapy 11/19/16-12/02/16   brain 25 Gy in 10 fractions   Hyperlipidemia    Hypertension    Hypertension 08/07/2016   Osteoporosis    Small cell lung cancer ([facility]) dx'd 05/2016    ALLERGIES:  is allergic to codeine, motrin [ibuprofen], thiazide-type diuretics, and vicodin [hydrocodone-acetaminophen].  MEDICATIONS:  Current Outpatient Medications  Medication Sig Dispense Refill   clopidogrel (PLAVIX) 75 MG tablet Take 1 tablet (75 mg total) by mouth daily. 30 tablet 0   diazepam (VALIUM) 5 MG tablet Take 5 mg by mouth 3 (three) times daily as needed for anxiety.   2   folic acid (FOLVITE) 1 MG tablet Take 1 tablet (1 mg total) by mouth daily. 30 tablet 1   hydrOXYzine (ATARAX/VISTARIL) 10 MG tablet Take 1 tablet (10 mg total) by mouth 3 (three) times daily as needed. 90 tablet 1   lidocaine-prilocaine (EMLA) cream Apply 1 application topically as needed. 30 g 2   oxyCODONE-acetaminophen (PERCOCET) 5-325 MG tablet Take 1  tablet by mouth every 6 (six) hours as needed for severe pain. 30 tablet 0   potassium chloride 20 MEQ/15ML (10%) SOLN Take 15 mLs (20 mEq total) by mouth daily. 150 mL 0   prochlorperazine (COMPAZINE) 10 MG tablet Take 1 tablet (10 mg total) by mouth every 6 (six) hours as needed for nausea or vomiting. 30 tablet 2   traMADol (ULTRAM) 50 MG tablet Take 50 mg by mouth 4 (four) times daily as needed for moderate pain (confirmed with her Pharmacy-Dr K. Little 03/15 she received #120).     No current  facility-administered medications for this visit.   Facility-Administered Medications Ordered in Other Visits  Medication Dose Route Frequency Provider Last Rate Last Admin   sodium chloride flush (NS) 0.9 % injection 10 mL  10 mL Intracatheter PRN [name], MD   10 mL at 07/04/21 0901    SURGICAL HISTORY:  Past Surgical History:  Procedure Laterality Date   ABDOMINAL HYSTERECTOMY  1981   APPENDECTOMY     at age 29   EYE SURGERY     left   IR GENERIC HISTORICAL  06/03/2016   IR FLUORO GUIDE PORT INSERTION RIGHT 06/03/2016 [facility]   IR GENERIC HISTORICAL  06/03/2016   IR US GUIDE VASC ACCESS RIGHT 06/03/2016 [facility]   SKIN CANCER EXCISION     basal cell carcinoma L side of face    REVIEW OF SYSTEMS:  A comprehensive review of systems was negative except for: Respiratory: positive for dyspnea on exertion   PHYSICAL EXAMINATION: General appearance: alert, cooperative, and no distress Head: Normocephalic, without obvious abnormality, atraumatic Neck: no adenopathy, no JVD, supple, symmetrical, trachea midline, and thyroid not enlarged, symmetric, no tenderness/mass/nodules Lymph nodes: Cervical, supraclavicular, and axillary nodes normal. Resp: clear to auscultation bilaterally Back: symmetric, no curvature. ROM normal. No CVA tenderness. Cardio: regular rate and rhythm, S1, S2 normal, no murmur, click, rub or gallop GI: soft, non-tender; bowel sounds normal; no masses,  no organomegaly Extremities: extremities normal, atraumatic, no cyanosis or edema  ECOG PERFORMANCE STATUS: 1 - Symptomatic but completely ambulatory  Blood pressure 91/70, pulse 89, temperature (!) 96.4 F (35.8 C), temperature source Tympanic, resp. rate 18, height 5\' 3"  (1.6 m), weight 110 lb 8 oz (50.1 kg), SpO2 99 %.  LABORATORY DATA: Lab Results  Component Value Date   WBC 7.0 06/13/2021   HGB 11.4 (L) 06/13/2021   HCT 34.2 (L) 06/13/2021   MCV 103.3 (H) 06/13/2021   PLT 264 06/13/2021       Chemistry      Component Value Date/Time   NA 142 06/13/2021 0935   NA 141 08/01/2017 0835   K 2.8 (L) 06/13/2021 0935   K 3.5 08/01/2017 0835   CL 106 06/13/2021 0935   CO2 26 06/13/2021 0935   CO2 25 08/01/2017 0835   BUN <5 (L) 06/13/2021 0935   BUN 8.0 08/01/2017 0835   CREATININE 0.78 06/13/2021 0935   CREATININE 0.9 08/01/2017 0835      Component Value Date/Time   CALCIUM 9.0 06/13/2021 0935   CALCIUM 9.9 08/01/2017 0835   ALKPHOS 88 06/13/2021 0935   ALKPHOS 142 08/01/2017 0835   AST 21 06/13/2021 0935   AST 15 08/01/2017 0835   ALT 12 06/13/2021 0935   ALT 19 08/01/2017 0835   BILITOT 0.2 (L) 06/13/2021 0935   BILITOT 0.35 08/01/2017 0835       RADIOGRAPHIC STUDIES: No results found.  ASSESSMENT AND PLAN:  This is  a very pleasant 60 years old white female with extensive stage small cell lung cancer status post systemic chemotherapy with carbo platinum and etoposide for 6 cycles and the patient rotated her treatment well except for chemotherapy-induced anemia and requirement for PRBCs and platelet transfusion. She had significant improvement in her disease with the chemotherapy. She also had prophylactic cranial irradiation. She also underwent stereotactic radiotherapy to progressive right upper lobe pulmonary nodule. The patient has been in observation for close to 2 years. Repeat CT scan of the chest, abdomen and pelvis performed recently showed evidence for disease progression in the abdomen. The patient was started on systemic chemotherapy again with carboplatin, etoposide and Tecentriq status post 51 cycles.  Starting from cycle #5 the patient is treated with maintenance single agent Tecentriq. The patient has been tolerating this treatment well with no concerning adverse effects. I recommended for her to proceed with cycle #52 today as planned. I will see the patient back for follow-up visit in 3 weeks for evaluation before the next cycle of her  treatment. For the chronic back pain, I will give her a refill of Percocet. For the pruritus, she will continue her current treatment with Atarax and apply hydrocortisone cream to the affected area on as-needed basis. The patient was advised to call immediately if she has any other concerning symptoms in the interval. All questions were answered. The patient knows to call the clinic with any problems, questions or concerns. We can certainly see the patient much sooner if necessary.  Disclaimer: This note was dictated with voice recognition software. Similar sounding words can inadvertently be transcribed and may not be corrected upon review.

## 2021-07-25 ENCOUNTER — Other Ambulatory Visit: Payer: Self-pay | Admitting: Physician Assistant

## 2021-07-25 ENCOUNTER — Encounter: Payer: Self-pay | Admitting: Internal Medicine

## 2021-07-25 ENCOUNTER — Inpatient Hospital Stay: Payer: Medicare Other

## 2021-07-25 ENCOUNTER — Other Ambulatory Visit: Payer: Self-pay | Admitting: Internal Medicine

## 2021-07-25 ENCOUNTER — Telehealth: Payer: Self-pay | Admitting: General Practice

## 2021-07-25 ENCOUNTER — Inpatient Hospital Stay (HOSPITAL_BASED_OUTPATIENT_CLINIC_OR_DEPARTMENT_OTHER): Payer: Medicare Other | Admitting: Internal Medicine

## 2021-07-25 ENCOUNTER — Inpatient Hospital Stay: Payer: Medicare Other | Admitting: Dietician

## 2021-07-25 ENCOUNTER — Other Ambulatory Visit: Payer: Self-pay

## 2021-07-25 VITALS — BP 105/86 | HR 89 | Temp 97.6°F | Resp 18 | Wt 109.2 lb

## 2021-07-25 DIAGNOSIS — C3491 Malignant neoplasm of unspecified part of right bronchus or lung: Secondary | ICD-10-CM | POA: Diagnosis not present

## 2021-07-25 DIAGNOSIS — C3490 Malignant neoplasm of unspecified part of unspecified bronchus or lung: Secondary | ICD-10-CM

## 2021-07-25 DIAGNOSIS — E876 Hypokalemia: Secondary | ICD-10-CM

## 2021-07-25 DIAGNOSIS — Z5112 Encounter for antineoplastic immunotherapy: Secondary | ICD-10-CM

## 2021-07-25 DIAGNOSIS — R5383 Other fatigue: Secondary | ICD-10-CM

## 2021-07-25 LAB — CMP (CANCER CENTER ONLY)
ALT: 8 U/L (ref 0–44)
AST: 14 U/L — ABNORMAL LOW (ref 15–41)
Albumin: 3.1 g/dL — ABNORMAL LOW (ref 3.5–5.0)
Alkaline Phosphatase: 116 U/L (ref 38–126)
Anion gap: 10 (ref 5–15)
BUN: 4 mg/dL — ABNORMAL LOW (ref 8–23)
CO2: 24 mmol/L (ref 22–32)
Calcium: 8.7 mg/dL — ABNORMAL LOW (ref 8.9–10.3)
Chloride: 104 mmol/L (ref 98–111)
Creatinine: 0.79 mg/dL (ref 0.44–1.00)
GFR, Estimated: >60 mL/min (ref >60)
Glucose, Bld: 115 mg/dL — ABNORMAL HIGH (ref 70–99)
Potassium: 3.2 mmol/L — ABNORMAL LOW (ref 3.5–5.1)
Sodium: 138 mmol/L (ref 135–145)
Total Bilirubin: <0.2 mg/dL — ABNORMAL LOW (ref 0.3–1.2)
Total Protein: 6.4 g/dL — ABNORMAL LOW (ref 6.5–8.1)

## 2021-07-25 LAB — CBC WITH DIFFERENTIAL (CANCER CENTER ONLY)
Abs Immature Granulocytes: 0.02 10*3/uL (ref 0.00–0.07)
Basophils Absolute: 0 10*3/uL (ref 0.0–0.1)
Basophils Relative: 1 %
Eosinophils Absolute: 0.3 10*3/uL (ref 0.0–0.5)
Eosinophils Relative: 4 %
HCT: 36.4 % (ref 36.0–46.0)
Hemoglobin: 12.2 g/dL (ref 12.0–15.0)
Immature Granulocytes: 0 %
Lymphocytes Relative: 26 %
Lymphs Abs: 1.9 10*3/uL (ref 0.7–4.0)
MCH: 32.8 pg (ref 26.0–34.0)
MCHC: 33.5 g/dL (ref 30.0–36.0)
MCV: 97.8 fL (ref 80.0–100.0)
Monocytes Absolute: 0.4 10*3/uL (ref 0.1–1.0)
Monocytes Relative: 5 %
Neutro Abs: 4.6 10*3/uL (ref 1.7–7.7)
Neutrophils Relative %: 64 %
Platelet Count: 255 10*3/uL (ref 150–400)
RBC: 3.72 MIL/uL — ABNORMAL LOW (ref 3.87–5.11)
RDW: 11.4 % — ABNORMAL LOW (ref 11.5–15.5)
WBC Count: 7.1 10*3/uL (ref 4.0–10.5)
nRBC: 0 % (ref 0.0–0.2)

## 2021-07-25 LAB — TSH: TSH: 2.164 u[IU]/mL (ref 0.308–3.960)

## 2021-07-25 MED ORDER — SODIUM CHLORIDE 0.9 % IV SOLN
1200.0000 mg | Freq: Once | INTRAVENOUS | Status: AC
  Administered 2021-07-25: 1200 mg via INTRAVENOUS
  Filled 2021-07-25: qty 20

## 2021-07-25 MED ORDER — POTASSIUM CHLORIDE 20 MEQ/15ML (10%) PO SOLN: 20.0000 meq | Freq: Every day | ORAL | 0 refills | Status: DC

## 2021-07-25 MED ORDER — SODIUM CHLORIDE 0.9% FLUSH: 10.0000 mL | INTRAVENOUS | Status: DC | PRN

## 2021-07-25 MED ORDER — SODIUM CHLORIDE 0.9 % IV SOLN
Freq: Once | INTRAVENOUS | Status: AC
Start: 2021-07-25 — End: 2021-07-25

## 2021-07-25 MED ORDER — SODIUM CHLORIDE 0.9% FLUSH
10.0000 mL | INTRAVENOUS | Status: DC | PRN
  Administered 2021-07-25: 10 mL

## 2021-07-25 MED ORDER — HEPARIN SOD (PORK) LOCK FLUSH 100 UNIT/ML IV SOLN: 500.0000 [IU] | Freq: Once | INTRAVENOUS | Status: DC | PRN

## 2021-07-25 NOTE — Telephone Encounter (Signed)
[facility] CSW Progress Notes  Call to patient per request from [facility] dietitian.  Patient expressed financial distress today, was given bag of food from pantry.  Spoke w family member, she was not at home nor does she have VM or email.  Will call her at another time w food pantry information as well as encouraging her to complete applications for [facility] - was given these applications July 2671.  Edwyna Shell, LCSW Clinical Social Worker Phone:  [phone]

## 2021-07-25 NOTE — Progress Notes (Signed)
[facility] [phone]   [phone]  OFFICE PROGRESS NOTE  [name], MD [address] [place] [address]  DIAGNOSIS: Extensive stage (T2a, N3, [name], large right anterior mediastinal and supraclavicular lymphadenopathy as well as pancreatic and splenic metastasis diagnosed in September 2017.  The patient had disease progression in October 2019.  PRIOR THERAPY:  1) Systemic chemotherapy was carboplatin for AUC of 5 on day 1 and etoposide 100 MG/M2 on days 1, 2 and 3 with Neulasta support. Status post 6 cycles with significant response of her disease. 2) Prophylactic cranial irradiation under the care of Dr. Sondra Come on 12/02/2016. 3) stereotactic radiotherapy to the recurrent right upper lobe pulmonary nodule under the care of Dr. Sondra Come completed November 10, 2017. 4) Retreatment with systemic chemotherapy with carboplatin for AUC of 5 on day 1 and etoposide 100 mg/M2 on days 1, 2 and 3 as well as Tecentriq (Atezolizumab) 1200 mg IV every 3 weeks with Neulasta support.  First dose June 22, 2018 for disease recurrence.  Status post 5 cycles.  Starting from cycle #2 her dose of carboplatin will be reduced to AUC of 4 and etoposide 80 mg/M2 on days 1, 2 and 3 in addition to the regular dose of Tecentriq.  CURRENT THERAPY: Maintenance treatment with single agent Tecentriq 1200 mg IV every 3 weeks.  Status post 47 cycles.  INTERVAL HISTORY: Debra Kidd 60 y.o. female returns to the clinic today for follow-up visit.  The patient is feeling fine today with no concerning complaints except for mild cough.  She denied having any chest pain, shortness of breath or hemoptysis.  She denied having any fever or chills.  She has no nausea, vomiting, diarrhea or constipation.  She has no headache or visual changes.  She is here today for evaluation before starting cycle #48 of her maintenance treatment  with Tecentriq.  MEDICAL HISTORY: Past Medical History:  Diagnosis Date   Basal cell carcinoma of cheek    L side of face   Blood type, Rh positive    Cancer (HCC)    Chronic pain syndrome    Depression 08/07/2016   Encounter for antineoplastic chemotherapy 07/17/2016   History of external beam radiation therapy 11/19/16-12/02/16   brain 25 Gy in 10 fractions   Hyperlipidemia    Hypertension    Hypertension 08/07/2016   Osteoporosis    Small cell lung cancer ([facility]) dx'd 05/2016    ALLERGIES:  is allergic to codeine, motrin [ibuprofen], thiazide-type diuretics, and vicodin [hydrocodone-acetaminophen].  MEDICATIONS:  Current Outpatient Medications  Medication Sig Dispense Refill   clopidogrel (PLAVIX) 75 MG tablet Take 1 tablet (75 mg total) by mouth daily. 30 tablet 0   diazepam (VALIUM) 5 MG tablet Take 5 mg by mouth 3 (three) times daily as needed for anxiety.   2   folic acid (FOLVITE) 1 MG tablet Take 1 tablet (1 mg total) by mouth daily. 30 tablet 1   hydrOXYzine (ATARAX/VISTARIL) 10 MG tablet Take 1 tablet (10 mg total) by mouth 3 (three) times daily as needed. 90 tablet 1   lidocaine-prilocaine (EMLA) cream Apply 1 application topically as needed. 30 g 2   oxyCODONE-acetaminophen (PERCOCET) 5-325 MG tablet Take 1 tablet by mouth every 6 (six) hours as needed for severe pain. 30 tablet 0   potassium chloride 20 MEQ/15ML (10%) SOLN Take 15 mLs (20 mEq total) by  mouth daily. 150 mL 0   prochlorperazine (COMPAZINE) 10 MG tablet Take 1 tablet (10 mg total) by mouth every 6 (six) hours as needed for nausea or vomiting. 30 tablet 2   traMADol (ULTRAM) 50 MG tablet Take 50 mg by mouth 4 (four) times daily as needed for moderate pain (confirmed with her Pharmacy-Dr K. Little 03/15 she received #120).     No current facility-administered medications for this visit.   Facility-Administered Medications Ordered in Other Visits  Medication Dose Route Frequency Provider Last Rate Last Admin    sodium chloride flush (NS) 0.9 % injection 10 mL  10 mL Intracatheter PRN [name], MD   10 mL at 07/25/21 0830    SURGICAL HISTORY:  Past Surgical History:  Procedure Laterality Date   ABDOMINAL HYSTERECTOMY  1981   APPENDECTOMY     at age 68   EYE SURGERY     left   IR GENERIC HISTORICAL  06/03/2016   IR FLUORO GUIDE PORT INSERTION RIGHT 06/03/2016 [facility]   IR GENERIC HISTORICAL  06/03/2016   IR US GUIDE VASC ACCESS RIGHT 06/03/2016 [facility]   SKIN CANCER EXCISION     basal cell carcinoma L side of face    REVIEW OF SYSTEMS:  A comprehensive review of systems was negative except for: Respiratory: positive for cough   PHYSICAL EXAMINATION: General appearance: alert, cooperative, and no distress Head: Normocephalic, without obvious abnormality, atraumatic Neck: no adenopathy, no JVD, supple, symmetrical, trachea midline, and thyroid not enlarged, symmetric, no tenderness/mass/nodules Lymph nodes: Cervical, supraclavicular, and axillary nodes normal. Resp: clear to auscultation bilaterally Back: symmetric, no curvature. ROM normal. No CVA tenderness. Cardio: regular rate and rhythm, S1, S2 normal, no murmur, click, rub or gallop GI: soft, non-tender; bowel sounds normal; no masses,  no organomegaly Extremities: extremities normal, atraumatic, no cyanosis or edema  ECOG PERFORMANCE STATUS: 1 - Symptomatic but completely ambulatory  Blood pressure 105/86, pulse 89, temperature 97.6 F (36.4 C), temperature source Tympanic, resp. rate 18, weight 109 lb 4 oz (49.6 kg), SpO2 100 %.  LABORATORY DATA: Lab Results  Component Value Date   WBC 7.1 07/25/2021   HGB 12.2 07/25/2021   HCT 36.4 07/25/2021   MCV 97.8 07/25/2021   PLT 255 07/25/2021      Chemistry      Component Value Date/Time   NA 139 07/04/2021 0901   NA 141 08/01/2017 0835   K 3.3 (L) 07/04/2021 0901   K 3.5 08/01/2017 0835   CL 103 07/04/2021 0901   CO2 24 07/04/2021 0901   CO2 25 08/01/2017  0835   BUN <4 (L) 07/04/2021 0901   BUN 8.0 08/01/2017 0835   CREATININE 0.74 07/04/2021 0901   CREATININE 0.9 08/01/2017 0835      Component Value Date/Time   CALCIUM 8.4 (L) 07/04/2021 0901   CALCIUM 9.9 08/01/2017 0835   ALKPHOS 116 07/04/2021 0901   ALKPHOS 142 08/01/2017 0835   AST 12 (L) 07/04/2021 0901   AST 15 08/01/2017 0835   ALT 8 07/04/2021 0901   ALT 19 08/01/2017 0835   BILITOT 0.3 07/04/2021 0901   BILITOT 0.35 08/01/2017 0835       RADIOGRAPHIC STUDIES: No results found.  ASSESSMENT AND PLAN:  This is a very pleasant 60 years old white female with extensive stage small cell lung cancer status post systemic chemotherapy with carbo platinum and etoposide for 6 cycles and the patient rotated her treatment well except for chemotherapy-induced anemia and requirement  for PRBCs and platelet transfusion. She had significant improvement in her disease with the chemotherapy. She also had prophylactic cranial irradiation. She also underwent stereotactic radiotherapy to progressive right upper lobe pulmonary nodule. The patient has been in observation for close to 2 years. Repeat CT scan of the chest, abdomen and pelvis performed recently showed evidence for disease progression in the abdomen. The patient started on systemic chemotherapy again with carboplatin, etoposide and Tecentriq status post 52 cycles.  Starting from cycle #5 the patient is treated with maintenance single agent Tecentriq. The patient has been tolerating this treatment well with no concerning adverse effects. I recommended for her to proceed with cycle #53 of her treatment today as planned. I will see her back for follow-up visit in 3 weeks for evaluation with repeat CT scan of the chest for restaging of her disease. For the chronic back pain, I will give her a refill of Percocet. For the pruritus, she will continue her current treatment with Atarax and apply hydrocortisone cream to the affected area on  as-needed basis. She was advised to call immediately if she has any concerning symptoms in the interval. All questions were answered. The patient knows to call the clinic with any problems, questions or concerns. We can certainly see the patient much sooner if necessary.  Disclaimer: This note was dictated with voice recognition software. Similar sounding words can inadvertently be transcribed and may not be corrected upon review.

## 2021-07-25 NOTE — Progress Notes (Signed)
Nutrition Follow-up:  Patient receiving maintenance therapy with Tecentriq for extensive stage small cell lung cancer. She is s/p systemic chemotherapy with carboplatin and etoposide for 6 cycles.   Met with patient during infusion. She reports appetite is good, but due to rising cost of groceries, bills and prescriptions  sometimes she is unable to eat as she should. Patient drinks CIB when she can afford it. She eats one meal/day and snacks. Recalls usually drinking a glass of milk for breakfast, snacks on banana or something small later in the day, eats balanced dinner with meat, starch, veggies. Patient lives in [facility] and is unaware of food assistance programs in her area.  Medications: potassium chloride 20 mEq/15 mL (37% solution), folic acid, percocet, compazine  Labs: K 3.2, Glucose 115, BUN 4  Anthropometrics: Weight 109 lb 4 oz today stable. Patient weighed 110 lb 8 oz on 11/2   10/12 - 111 lb 3.2 oz 9/22 - 111 lb  NUTRITION DIAGNOSIS: Unintended weight loss stable    INTERVENTION:  Patient interested in learning about food assistance programs in her area - message sent to LCSW to assist patient with resources Provided food bag from [facility] food pantry Discussed ways to add calories and protein to foods Encouraged drinking ONS as able for added calories and protein Contact information provided    MONITORING, EVALUATION, GOAL: weight trends, intake   NEXT VISIT: Wednesday December 14 during infusion

## 2021-07-25 NOTE — Patient Instructions (Signed)
Steps to Quit Smoking Smoking tobacco is the leading cause of preventable death. It can affect almost every organ in the body. Smoking puts you and people around you at risk for many serious, long-lasting (chronic) diseases. Quitting smoking can be hard, but it is one of the best things that you can do for your health. It is never too late to quit. How do I get ready to quit? When you decide to quit smoking, make a plan to help you succeed. Before you quit: Pick a date to quit. Set a date within the next 2 weeks to give you time to prepare. Write down the reasons why you are quitting. Keep this list in places where you will see it often. Tell your family, friends, and co-workers that you are quitting. Their support is important. Talk with your doctor about the choices that may help you quit. Find out if your health insurance will pay for these treatments. Know the people, places, things, and activities that make you want to smoke (triggers). Avoid them. What first steps can I take to quit smoking? Throw away all cigarettes at home, at work, and in your car. Throw away the things that you use when you smoke, such as ashtrays and lighters. Clean your car. Make sure to empty the ashtray. Clean your home, including curtains and carpets. What can I do to help me quit smoking? Talk with your doctor about taking medicines and seeing a counselor at the same time. You are more likely to succeed when you do both. If you are pregnant or breastfeeding, talk with your doctor about counseling or other ways to quit smoking. Do not take medicine to help you quit smoking unless your doctor tells you to do so. To quit smoking: Quit right away Quit smoking totally, instead of slowly cutting back on how much you smoke over a period of time. Go to counseling. You are more likely to quit if you go to counseling sessions regularly. Take medicine You may take medicines to help you quit. Some medicines need a  prescription, and some you can buy over-the-counter. Some medicines may contain a drug called nicotine to replace the nicotine in cigarettes. Medicines may: Help you to stop having the desire to smoke (cravings). Help to stop the problems that come when you stop smoking (withdrawal symptoms). Your doctor may ask you to use: Nicotine patches, gum, or lozenges. Nicotine inhalers or sprays. Non-nicotine medicine that is taken by mouth. Find resources Find resources and other ways to help you quit smoking and remain smoke-free after you quit. These resources are most helpful when you use them often. They include: Online chats with a counselor. Phone quitlines. Printed self-help materials. Support groups or group counseling. Text messaging programs. Mobile phone apps. Use apps on your mobile phone or tablet that can help you stick to your quit plan. There are many free apps for mobile phones and tablets as well as websites. Examples include Quit Guide from the CDC and smokefree.gov  What things can I do to make it easier to quit?  Talk to your family and friends. Ask them to support and encourage you. Call a phone quitline (1-800-QUIT-NOW), reach out to support groups, or work with a counselor. Ask people who smoke to not smoke around you. Avoid places that make you want to smoke, such as: Bars. Parties. Smoke-break areas at work. Spend time with people who do not smoke. Lower the stress in your life. Stress can make you want to   smoke. Try these things to help your stress: Getting regular exercise. Doing deep-breathing exercises. Doing yoga. Meditating. Doing a body scan. To do this, close your eyes, focus on one area of your body at a time from head to toe. Notice which parts of your body are tense. Try to relax the muscles in those areas. How will I feel when I quit smoking? Day 1 to 3 weeks Within the first 24 hours, you may start to have some problems that come from quitting tobacco.  These problems are very bad 2-3 days after you quit, but they do not often last for more than 2-3 weeks. You may get these symptoms: Mood swings. Feeling restless, nervous, angry, or annoyed. Trouble concentrating. Dizziness. Strong desire for high-sugar foods and nicotine. Weight gain. Trouble pooping (constipation). Feeling like you may vomit (nausea). Coughing or a sore throat. Changes in how the medicines that you take for other issues work in your body. Depression. Trouble sleeping (insomnia). Week 3 and afterward After the first 2-3 weeks of quitting, you may start to notice more positive results, such as: Better sense of smell and taste. Less coughing and sore throat. Slower heart rate. Lower blood pressure. Clearer skin. Better breathing. Fewer sick days. Quitting smoking can be hard. Do not give up if you fail the first time. Some people need to try a few times before they succeed. Do your best to stick to your quit plan, and talk with your doctor if you have any questions or concerns. Summary Smoking tobacco is the leading cause of preventable death. Quitting smoking can be hard, but it is one of the best things that you can do for your health. When you decide to quit smoking, make a plan to help you succeed. Quit smoking right away, not slowly over a period of time. When you start quitting, seek help from your doctor, family, or friends. This information is not intended to replace advice given to you by your health care provider. Make sure you discuss any questions you have with your health care provider. Document Revised: 04/27/2021 Document Reviewed: 11/07/2018 Elsevier Patient Education  2022 Elsevier Inc.  

## 2021-07-25 NOTE — Progress Notes (Signed)
Potassium 3.2 today. Informed Dr. Julien Nordmann today, he will send in script for her to take at home.

## 2021-07-25 NOTE — Progress Notes (Signed)
Pt applied to the [facility] and was approved for $6,000 for Tecentriq from 04/11/21 until 04/11/22.

## 2021-07-25 NOTE — Patient Instructions (Signed)
[facility] ONCOLOGY  Discharge Instructions: Thank you for choosing [facility] to provide your oncology and hematology care.   If you have a lab appointment with the [facility], please go directly to the [facility] and check in at the registration area.   Wear comfortable clothing and clothing appropriate for easy access to any Portacath or PICC line.   We strive to give you quality time with your provider. You may need to reschedule your appointment if you arrive late (15 or more minutes).  Arriving late affects you and other patients whose appointments are after yours.  Also, if you miss three or more appointments without notifying the office, you may be dismissed from the clinic at the provider's discretion.      For prescription refill requests, have your pharmacy contact our office and allow 72 hours for refills to be completed.    Today you received the following immunotherapy agents Tecentriq   To help prevent nausea and vomiting after your treatment, we encourage you to take your nausea medication as directed.  BELOW ARE SYMPTOMS THAT SHOULD BE REPORTED IMMEDIATELY: *FEVER GREATER THAN 100.4 F (38 C) OR HIGHER *CHILLS OR SWEATING *NAUSEA AND VOMITING THAT IS NOT CONTROLLED WITH YOUR NAUSEA MEDICATION *UNUSUAL SHORTNESS OF BREATH *UNUSUAL BRUISING OR BLEEDING *URINARY PROBLEMS (pain or burning when urinating, or frequent urination) *BOWEL PROBLEMS (unusual diarrhea, constipation, pain near the anus) TENDERNESS IN MOUTH AND THROAT WITH OR WITHOUT PRESENCE OF ULCERS (sore throat, sores in mouth, or a toothache) UNUSUAL RASH, SWELLING OR PAIN  UNUSUAL VAGINAL DISCHARGE OR ITCHING   Items with * indicate a potential emergency and should be followed up as soon as possible or go to the Emergency Department if any problems should occur.  Please show the CHEMOTHERAPY ALERT CARD or IMMUNOTHERAPY ALERT CARD at check-in to the Emergency Department  and triage nurse.  Should you have questions after your visit or need to cancel or reschedule your appointment, please contact [facility]  Dept: [phone]  and follow the prompts.  Office hours are 8:00 a.m. to 4:30 p.m. Monday - Friday. Please note that voicemails left after 4:00 p.m. may not be returned until the following business day.  We are closed weekends and major holidays. You have access to a nurse at all times for urgent questions. Please call the main number to the clinic Dept: [phone] and follow the prompts.   For any non-urgent questions, you may also contact your provider using MyChart. We now offer e-Visits for anyone 79 and older to request care online for non-urgent symptoms. For details visit [email or web address].   Also download the MyChart app! Go to the app store, search "MyChart", open the app, select [facility], and log in with your MyChart username and password.  Due to Covid, a mask is required upon entering the hospital/clinic. If you do not have a mask, one will be given to you upon arrival. For doctor visits, patients may have 1 support person aged 76 or older with them. For treatment visits, patients cannot have anyone with them due to current Covid guidelines and our immunocompromised population.

## 2021-07-27 ENCOUNTER — Telehealth: Payer: Self-pay | Admitting: General Practice

## 2021-07-27 NOTE — Telephone Encounter (Signed)
[facility] CSW Progress Notes  Unable to reach patient by phone to offer resources, will mail food pantry list, [facility] information to her home address.  Edwyna Shell, LCSW Clinical Social Worker Phone:  [phone]

## 2021-08-09 ENCOUNTER — Encounter (HOSPITAL_COMMUNITY): Payer: Self-pay

## 2021-08-09 ENCOUNTER — Other Ambulatory Visit: Payer: Self-pay

## 2021-08-09 ENCOUNTER — Ambulatory Visit (HOSPITAL_COMMUNITY)
Admission: RE | Admit: 2021-08-09 | Discharge: 2021-08-09 | Disposition: A | Discharge status: Home / Self Care | Payer: Medicare Other | Source: Ambulatory Visit | Attending: Internal Medicine | Admitting: Internal Medicine

## 2021-08-09 DIAGNOSIS — C3490 Malignant neoplasm of unspecified part of unspecified bronchus or lung: Secondary | ICD-10-CM | POA: Insufficient documentation

## 2021-08-09 MED ORDER — IOHEXOL 350 MG/ML SOLN
75.0000 mL | Freq: Once | INTRAVENOUS | Status: AC | PRN
  Administered 2021-08-09: 75 mL via INTRAVENOUS

## 2021-08-09 MED ORDER — SODIUM CHLORIDE (PF) 0.9 % IJ SOLN
INTRAMUSCULAR | Status: AC
  Filled 2021-08-09: qty 50

## 2021-08-09 MED ORDER — HEPARIN SOD (PORK) LOCK FLUSH 100 UNIT/ML IV SOLN
500.0000 [IU] | Freq: Once | INTRAVENOUS | Status: AC
  Administered 2021-08-09: 500 [IU] via INTRAVENOUS

## 2021-08-10 NOTE — Progress Notes (Signed)
[facility] OFFICE PROGRESS NOTE  [name], MD [address] [place] [address]  DIAGNOSIS:  Extensive stage (T2a, N3, [name], large right anterior mediastinal and supraclavicular lymphadenopathy as well as pancreatic and splenic metastasis diagnosed in September 2017.  The patient had disease progression in October 2019.  PRIOR THERAPY:  1) Systemic chemotherapy was carboplatin for AUC of 5 on day 1 and etoposide 100 MG/M2 on days 1, 2 and 3 with Neulasta support. Status post 6 cycles with significant response of her disease. 2) Prophylactic cranial irradiation under the care of Dr. Sondra Come on 12/02/2016. 3) stereotactic radiotherapy to the recurrent right upper lobe pulmonary nodule under the care of Dr. Sondra Come completed November 10, 2017. 4) Retreatment with systemic chemotherapy with carboplatin for AUC of 5 on day 1 and etoposide 100 mg/M2 on days 1, 2 and 3 as well as Tecentriq (Atezolizumab) 1200 mg IV every 3 weeks with Neulasta support.  First dose June 22, 2018 for disease recurrence.  Status post 5 cycles.  Starting from cycle #2 her dose of carboplatin will be reduced to AUC of 4 and etoposide 80 mg/M2 on days 1, 2 and 3 in addition to the regular dose of Tecentriq.  CURRENT THERAPY: Maintenance treatment with single agent Tecentriq 1200 mg IV every 3 weeks.  Status post 53 cycles.  INTERVAL HISTORY: Debra Kidd 60 y.o. female returns to the clinic today for a follow-up visit.  She is feeling fairly well today without any concerning complaints except she fell twice recently. Once she was walking on her driveway and her knee "gave out". Denies hitting her head or any injuries. The second time she lost her balance because she tripped on her dogs bed on the floor. She has a [employer] at home but does not use them.  She is being followed by social work for her financial concerns.   She is tolerating her  treatment fairly well except for some dry skin and itching.  For the itching she is prescribed Atarax which she says makes her drowsy and dizzy so she avoids using. She does use hydrocortisone cream which she reports some relief with. Her weight is stable. She continues to struggle with a decreased appetite and weight loss, although her weight is stable today. She has been followed by a member of the nutritionist team at her last appointment and she is scheduled to see them again while in the infusion room today. She was given information for food pantry at her last appointment. She does not hydrate well at home and does not drink a lot of fluid. Her BP is low today. She also has intermittent diarrhea 2-3x per day and does not take imodium. She often has hypokalemia on labs likely from intermittent diarrhea.   She denies any recent fever or night sweats. The patient denies any new nausea, vomiting, or constipation. She reports her stable baseline dyspnea on exertion she denies any chest pain. Has cough that is sometimes productive, no hemoptysis. She continues to smoke 1.5-2 packs of cigarettes per day. She notes increased frontal headaches and wavy lines in her vision. She has had a restaging CT scan performed.  She is here today for evaluation and to review her scan results before starting cycle #54   MEDICAL HISTORY: Past Medical History:  Diagnosis Date   Basal cell carcinoma of cheek    L side of face  Blood type, Rh positive    Cancer (HCC)    Chronic pain syndrome    Depression 08/07/2016   Encounter for antineoplastic chemotherapy 07/17/2016   History of external beam radiation therapy 11/19/16-12/02/16   brain 25 Gy in 10 fractions   Hyperlipidemia    Hypertension    Hypertension 08/07/2016   Osteoporosis    Small cell lung cancer ([facility]) dx'd 05/2016    ALLERGIES:  is allergic to codeine, motrin [ibuprofen], thiazide-type diuretics, and vicodin [hydrocodone-acetaminophen].  MEDICATIONS:   Current Outpatient Medications  Medication Sig Dispense Refill   clopidogrel (PLAVIX) 75 MG tablet Take 1 tablet (75 mg total) by mouth daily. 30 tablet 0   diazepam (VALIUM) 5 MG tablet Take 5 mg by mouth 3 (three) times daily as needed for anxiety.   2   folic acid (FOLVITE) 1 MG tablet Take 1 tablet (1 mg total) by mouth daily. 30 tablet 1   hydrOXYzine (ATARAX/VISTARIL) 10 MG tablet Take 1 tablet (10 mg total) by mouth 3 (three) times daily as needed. 90 tablet 1   lidocaine-prilocaine (EMLA) cream Apply 1 application topically as needed. 30 g 2   oxyCODONE-acetaminophen (PERCOCET) 5-325 MG tablet Take 1 tablet by mouth every 6 (six) hours as needed for severe pain. 30 tablet 0   potassium chloride 20 MEQ/15ML (10%) SOLN Take 15 mLs (20 mEq total) by mouth daily. 150 mL 0   prochlorperazine (COMPAZINE) 10 MG tablet Take 1 tablet (10 mg total) by mouth every 6 (six) hours as needed for nausea or vomiting. 30 tablet 2   traMADol (ULTRAM) 50 MG tablet Take 50 mg by mouth 4 (four) times daily as needed for moderate pain (confirmed with her Pharmacy-Dr K. Little 03/15 she received #120).     No current facility-administered medications for this visit.    SURGICAL HISTORY:  Past Surgical History:  Procedure Laterality Date   ABDOMINAL HYSTERECTOMY  1981   APPENDECTOMY     at age 22   EYE SURGERY     left   IR GENERIC HISTORICAL  06/03/2016   IR FLUORO GUIDE PORT INSERTION RIGHT 06/03/2016 [facility]   IR GENERIC HISTORICAL  06/03/2016   IR US GUIDE VASC ACCESS RIGHT 06/03/2016 [facility]   SKIN CANCER EXCISION     basal cell carcinoma L side of face    REVIEW OF SYSTEMS:   Review of Systems  Constitutional: Positive for fatigue and decreased appetite. Negative for chills and fever. HENT: Negative for mouth sores, nosebleeds, sore throat and trouble swallowing.   Eyes: Negative for icterus.  Positive for occasional wavy lines in vision. Respiratory: Positive for baseline cough  and shortness of breath with exertion. Negative for hemoptysis and wheezing.   Cardiovascular:  Negative for chest pain and leg swelling. Gastrointestinal: Positive for intermittent diarrhea.  Negative for abdominal pain, nausea, constipation, and vomiting. Genitourinary: Negative for bladder incontinence, difficulty urinating, dysuria, frequency and hematuria.   Musculoskeletal: Negative for gait problem, neck pain and neck stiffness. Skin: Positive for itching. Negative for rash. Neurological: Lower extremity weakness, gradually worsening L>R due to prior stoke.  Positive for occasional lightheadedness and frequent headaches.  Negative for dizziness, light-headedness and seizures. Hematological: Negative for adenopathy. Does not bruise/bleed easily. Psychiatric/Behavioral: Negative for confusion, depression and sleep disturbance. The patient is not nervous/anxious    PHYSICAL EXAMINATION:  Blood pressure 97/79, pulse 91, temperature 98.1 F (36.7 C), temperature source Tympanic, resp. rate 18, weight 110 lb 4.8 oz (50 kg), SpO2 100 %.  ECOG PERFORMANCE STATUS: 2-3  Physical Exam  Constitutional: Oriented to person, place, and time and chronically ill appearing female appears older than stated age and in no distress. HENT: Head: Normocephalic and atraumatic. Mouth/Throat: Oropharynx is clear and moist. No oropharyngeal exudate. Eyes: Conjunctivae are normal. Right eye exhibits no discharge. Left eye exhibits no discharge. No scleral icterus. Neck: Normal range of motion. Neck supple. Cardiovascular: Normal rate, regular rhythm, normal heart sounds and intact distal pulses.   Pulmonary/Chest: Effort normal.  Quiet breath sounds in all lung fields.  No respiratory distress. No wheezes. No rales. Abdominal: Soft. Bowel sounds are normal. Exhibits no distension and no mass. There is no tenderness.  Musculoskeletal: Normal range of motion. Exhibits no edema.  Lymphadenopathy:    No cervical  adenopathy.  Neurological: Alert and oriented to person, place, and time. Exhibits muscle wasting. Examined in the wheelchair.  Skin: Skin is warm and dry. No rash noted. Not diaphoretic. No erythema. No pallor.  Psychiatric: Mood, memory and judgment normal. Vitals reviewed.  LABORATORY DATA: Lab Results  Component Value Date   WBC 8.4 08/15/2021   HGB 11.5 (L) 08/15/2021   HCT 35.2 (L) 08/15/2021   MCV 97.2 08/15/2021   PLT 283 08/15/2021      Chemistry      Component Value Date/Time   NA 141 08/15/2021 0925   NA 141 08/01/2017 0835   K 3.7 08/15/2021 0925   K 3.5 08/01/2017 0835   CL 106 08/15/2021 0925   CO2 25 08/15/2021 0925   CO2 25 08/01/2017 0835   BUN 4 (L) 08/15/2021 0925   BUN 8.0 08/01/2017 0835   CREATININE 0.76 08/15/2021 0925   CREATININE 0.9 08/01/2017 0835      Component Value Date/Time   CALCIUM 9.1 08/15/2021 0925   CALCIUM 9.9 08/01/2017 0835   ALKPHOS 105 08/15/2021 0925   ALKPHOS 142 08/01/2017 0835   AST 13 (L) 08/15/2021 0925   AST 15 08/01/2017 0835   ALT 7 08/15/2021 0925   ALT 19 08/01/2017 0835   BILITOT 0.3 08/15/2021 0925   BILITOT 0.35 08/01/2017 0835       RADIOGRAPHIC STUDIES:  CT Chest W Contrast  Result Date: 08/10/2021 CLINICAL DATA:  Small cell lung cancer restaging. Immunotherapy in progress. Prior chemotherapy and radiation therapy. EXAM: CT CHEST, ABDOMEN, AND PELVIS WITH CONTRAST TECHNIQUE: Multidetector CT imaging of the chest, abdomen and pelvis was performed following the standard protocol during bolus administration of intravenous contrast. CONTRAST:  100mL OMNIPAQUE IOHEXOL 350 MG/ML SOLN COMPARISON:  Multiple exams, including 05/01/2021 FINDINGS: CT CHEST FINDINGS Cardiovascular: Coronary, aortic arch, and branch vessel atherosclerotic vascular disease. There is some chronic thrombus along the wall of the aortic arch. Mediastinum/Nodes: 10 by 7 mm densely calcified right thyroid nodule is unchanged from 05/01/2021. Not  clinically significant; no follow-up imaging recommended (ref: J Am Coll Radiol. 2015 Feb;12(2): 143-50). No pathologic adenopathy. Small type 1 hiatal hernia. Lungs/Pleura: Stable pleural-based right apical density with irregular margins, no morphologic change from 05/01/2021, no adjacent bony destructive findings along the right upper ribs. Mild right lower lobe scarring. Musculoskeletal: Stable mild endplate compressions T5, T6, T7, and T8. CT ABDOMEN PELVIS FINDINGS Hepatobiliary: Focal steatosis in segment 4 of the liver adjacent to the falciform ligament. Gallbladder unremarkable. No significant abnormality observed. No biliary dilatation. Pancreas: Unremarkable Spleen: Unremarkable Adrenals/Urinary Tract: Unremarkable Stomach/Bowel: Upper normal amount of stool in the colon. Vascular/Lymphatic: Substantial systemic atherosclerosis. Possible narrowing of the two left and single right renal arteries,  although today's is in exam is not a CT angiogram. Celiac trunk and SMA appear grossly patent although there is some narrowing in the proximal SMA the could be better assessed with angiography if clinically warranted. The inferior mesenteric artery appears patent. No pathologic adenopathy is identified. Reproductive: Uterus absent.  Adnexa unremarkable. Other: Stable central mesenteric stranding extending towards the right lower quadrant mesentery in a manner unchanged from 05/01/2021, but substantially improved from remote prior exams such as 06/09/2018. Musculoskeletal: Chronic superior endplate compression at L2, unchanged. IMPRESSION: 1. Stable right apical density presumably representing fibrosis and treatment related effects. No findings of active malignancy in the chest, abdomen, or pelvis. 2. Stable central mesenteric stranding extending towards the right lower quadrant mesentery. Although unchanged from 05/01/2021 the appearance is substantially improved from 06/09/2018. This could be an inflammatory  related to mesenteric panniculitis, or may be a manifestation of treated malignancy. 3. Other imaging findings of potential clinical significance: Substantial systemic atherosclerosis inlet occluding coronary artery calcifications and possible renal artery narrowings (today's exam was not a CT angiogram for detailed characterization). Aortic Atherosclerosis (ICD10-I70.0). Small type 1 hiatal hernia. Remote endplate compressions at T5, T6, T7, T8, and L2. Electronically Signed   By: Van Clines M.D.   On: 08/10/2021 10:37   CT Abdomen Pelvis W Contrast  Result Date: 08/10/2021 CLINICAL DATA:  Small cell lung cancer restaging. Immunotherapy in progress. Prior chemotherapy and radiation therapy. EXAM: CT CHEST, ABDOMEN, AND PELVIS WITH CONTRAST TECHNIQUE: Multidetector CT imaging of the chest, abdomen and pelvis was performed following the standard protocol during bolus administration of intravenous contrast. CONTRAST:  28mL OMNIPAQUE IOHEXOL 350 MG/ML SOLN COMPARISON:  Multiple exams, including 05/01/2021 FINDINGS: CT CHEST FINDINGS Cardiovascular: Coronary, aortic arch, and branch vessel atherosclerotic vascular disease. There is some chronic thrombus along the wall of the aortic arch. Mediastinum/Nodes: 10 by 7 mm densely calcified right thyroid nodule is unchanged from 05/01/2021. Not clinically significant; no follow-up imaging recommended (ref: J Am Coll Radiol. 2015 Feb;12(2): 143-50). No pathologic adenopathy. Small type 1 hiatal hernia. Lungs/Pleura: Stable pleural-based right apical density with irregular margins, no morphologic change from 05/01/2021, no adjacent bony destructive findings along the right upper ribs. Mild right lower lobe scarring. Musculoskeletal: Stable mild endplate compressions T5, T6, T7, and T8. CT ABDOMEN PELVIS FINDINGS Hepatobiliary: Focal steatosis in segment 4 of the liver adjacent to the falciform ligament. Gallbladder unremarkable. No significant abnormality observed.  No biliary dilatation. Pancreas: Unremarkable Spleen: Unremarkable Adrenals/Urinary Tract: Unremarkable Stomach/Bowel: Upper normal amount of stool in the colon. Vascular/Lymphatic: Substantial systemic atherosclerosis. Possible narrowing of the two left and single right renal arteries, although today's is in exam is not a CT angiogram. Celiac trunk and SMA appear grossly patent although there is some narrowing in the proximal SMA the could be better assessed with angiography if clinically warranted. The inferior mesenteric artery appears patent. No pathologic adenopathy is identified. Reproductive: Uterus absent.  Adnexa unremarkable. Other: Stable central mesenteric stranding extending towards the right lower quadrant mesentery in a manner unchanged from 05/01/2021, but substantially improved from remote prior exams such as 06/09/2018. Musculoskeletal: Chronic superior endplate compression at L2, unchanged. IMPRESSION: 1. Stable right apical density presumably representing fibrosis and treatment related effects. No findings of active malignancy in the chest, abdomen, or pelvis. 2. Stable central mesenteric stranding extending towards the right lower quadrant mesentery. Although unchanged from 05/01/2021 the appearance is substantially improved from 06/09/2018. This could be an inflammatory related to mesenteric panniculitis, or may be a manifestation of  treated malignancy. 3. Other imaging findings of potential clinical significance: Substantial systemic atherosclerosis inlet occluding coronary artery calcifications and possible renal artery narrowings (today's exam was not a CT angiogram for detailed characterization). Aortic Atherosclerosis (ICD10-I70.0). Small type 1 hiatal hernia. Remote endplate compressions at T5, T6, T7, T8, and L2. Electronically Signed   By: Van Clines M.D.   On: 08/10/2021 10:37     ASSESSMENT/PLAN:  This is a very pleasant 60 year old Caucasian female with extensive stage  small cell lung cancer.  She presented with a right upper lobe lung mass, large right anterior mediastinal and supraclavicular lymphadenopathy as well as a pancreatic and splenic metastasis.  She was diagnosed in September 2017.   The patient underwent systemic chemotherapy with carboplatin and etoposide.  She is status post 6 cycles.  She tolerated treatment well except for chemotherapy-induced anemia which required  pRBCs and platelet transfusions.  She had a significant improvement of her disease with chemotherapy. The patient then underwent prophylactic cranial irradiation. She then underwent stereotactic radiotherapy to the right upper lobe and pulmonary nodule.   She had been on observation for 2 years before showing evidence of disease progression.   She then was started on systemic chemotherapy with carboplatin, etoposide, and Tecentriq. Starting from cycle #5, she has been on maintenance single agent Tecentriq.  She has been tolerating treatment well. She is status post 53 cycles of Tecentriq total.  The patient recently had a restaging CT scan performed.  Dr. Julien Nordmann personally independently reviewed the scan discussed results with the patient today.  The scan did not show any evidence of disease progression.   Labs are reviewed. Recommend that she proceed with cycle #54 today scheduled.  The patient is hypotensive today likely due to her poor oral intake and her frequent diarrhea.  The patient was strongly encouraged to hydrate more at home as she does not drink water.  If she does not like the taste of water, discussed that she can make it more flavorful with lemon or Crystal light or something similar.  We will arrange for the receive 1 L of fluid over 2 hours today.  Also discussed the importance of taking Imodium if she experiences diarrhea to avoid hypotension and dehydration.  This is likely also contributing to her frequent hypokalemia.  She is scheduled to see member the nutritionist  team on the infusion room today.   Encouraged her to use a cane or a walker.  Discussed falls and fractures can cause significant morbidity and mortality.  He has faith at home but has not been using it.  Regarding her falls, it sounds if most of it is mechanical due to her muscle wasting and tripping over objects.  She also has a history of a stroke and has some left-sided weakness at baseline.  However given the increased frequency of her headaches, visual changes, and recent falls, we will arrange for her to have an MRI of the brain to rule out any metastatic disease to the brain.  The patient does have a extensive history of vascular disease for which she was previously referred to neurology for this and typically these findings are seen on her brain MRIs.  We will see her back for a follow-up visit in 3 weeks for evaluation before starting cycle #55.    The patient was advised to call immediately if she has any concerning symptoms in the interval. The patient voices understanding of current disease status and treatment options and is in agreement with  the current care plan. All questions were answered. The patient knows to call the clinic with any problems, questions or concerns. We can certainly see the patient much sooner if necessary     Orders Placed This Encounter  Procedures   MR Brain W Wo Contrast    Standing Status:   Future    Standing Expiration Date:   08/15/2022    Order Specific Question:   If indicated for the ordered procedure, I authorize the administration of contrast media per Radiology protocol    Answer:   Yes    Order Specific Question:   What is the patient's sedation requirement?    Answer:   No Sedation    Order Specific Question:   Does the patient have a pacemaker or implanted devices?    Answer:   No    Order Specific Question:   Use SRS Protocol?    Answer:   No    Order Specific Question:   Preferred imaging location?    Answer:   [facility]  (table limit - 550 lbs)   TSH    Standing Status:   Standing    Number of Occurrences:   20    Standing Expiration Date:   08/15/2022      Race Latour L Charmayne Odell, PA-C 08/15/21  ADDENDUM: Hematology/Oncology Attending: I had a face-to-face encounter with the patient today.  I reviewed her record, lab, scan and recommended her care plan.  This is a very pleasant 60 years old white female with extensive stage small cell lung cancer that was initially diagnosed in September 2017 status post several chemotherapy regimens as well as immunotherapy. The patient is currently undergoing treatment with immunotherapy with Tecentriq every 3 weeks status post 53 cycles.  The patient has been tolerating this treatment well with no concerning adverse effects but recently she has more headache than before. She had repeat CT scan of the chest, abdomen pelvis performed recently.  I personally and independently reviewed the scan and discussed the results with the patient today. Her scan showed no concerning findings for disease recurrence or metastasis. I recommended for the patient to continue her treatment with Tecentriq and she will proceed with cycle #54 today. For the recurrent headache, will arrange for the patient to have MRI of the brain to rule out any brain metastasis. The patient will come back for follow-up visit in 3 weeks for evaluation before the next cycle of her treatment. She was advised to call immediately if she has any concerning symptoms in the interval. The total time spent in the appointment was 30 minutes.  Disclaimer: This note was dictated with voice recognition software. Similar sounding words can inadvertently be transcribed and may be missed upon review. Eilleen Kempf, MD 08/15/21

## 2021-08-15 ENCOUNTER — Other Ambulatory Visit: Payer: Self-pay

## 2021-08-15 ENCOUNTER — Inpatient Hospital Stay (HOSPITAL_BASED_OUTPATIENT_CLINIC_OR_DEPARTMENT_OTHER): Payer: Medicare Other | Admitting: Physician Assistant

## 2021-08-15 ENCOUNTER — Inpatient Hospital Stay: Payer: Medicare Other | Admitting: Dietician

## 2021-08-15 ENCOUNTER — Inpatient Hospital Stay: Payer: Medicare Other | Attending: Internal Medicine

## 2021-08-15 ENCOUNTER — Encounter: Payer: Self-pay | Admitting: Physician Assistant

## 2021-08-15 ENCOUNTER — Inpatient Hospital Stay: Payer: Medicare Other

## 2021-08-15 VITALS — BP 97/79 | HR 91 | Temp 98.1°F | Resp 18 | Wt 110.3 lb

## 2021-08-15 VITALS — BP 104/83 | HR 85

## 2021-08-15 DIAGNOSIS — W19XXXS Unspecified fall, sequela: Secondary | ICD-10-CM | POA: Diagnosis not present

## 2021-08-15 DIAGNOSIS — Z9221 Personal history of antineoplastic chemotherapy: Secondary | ICD-10-CM | POA: Diagnosis not present

## 2021-08-15 DIAGNOSIS — Z5112 Encounter for antineoplastic immunotherapy: Secondary | ICD-10-CM

## 2021-08-15 DIAGNOSIS — C3491 Malignant neoplasm of unspecified part of right bronchus or lung: Secondary | ICD-10-CM

## 2021-08-15 DIAGNOSIS — R197 Diarrhea, unspecified: Secondary | ICD-10-CM | POA: Insufficient documentation

## 2021-08-15 DIAGNOSIS — I959 Hypotension, unspecified: Secondary | ICD-10-CM

## 2021-08-15 DIAGNOSIS — R519 Headache, unspecified: Secondary | ICD-10-CM | POA: Insufficient documentation

## 2021-08-15 DIAGNOSIS — C3411 Malignant neoplasm of upper lobe, right bronchus or lung: Secondary | ICD-10-CM | POA: Insufficient documentation

## 2021-08-15 DIAGNOSIS — Z923 Personal history of irradiation: Secondary | ICD-10-CM | POA: Insufficient documentation

## 2021-08-15 DIAGNOSIS — D6481 Anemia due to antineoplastic chemotherapy: Secondary | ICD-10-CM | POA: Diagnosis not present

## 2021-08-15 DIAGNOSIS — C7889 Secondary malignant neoplasm of other digestive organs: Secondary | ICD-10-CM | POA: Insufficient documentation

## 2021-08-15 DIAGNOSIS — Z79899 Other long term (current) drug therapy: Secondary | ICD-10-CM | POA: Insufficient documentation

## 2021-08-15 DIAGNOSIS — R5383 Other fatigue: Secondary | ICD-10-CM

## 2021-08-15 DIAGNOSIS — T451X5A Adverse effect of antineoplastic and immunosuppressive drugs, initial encounter: Secondary | ICD-10-CM | POA: Diagnosis not present

## 2021-08-15 LAB — CMP (CANCER CENTER ONLY)
ALT: 7 U/L (ref 0–44)
AST: 13 U/L — ABNORMAL LOW (ref 15–41)
Albumin: 3.3 g/dL — ABNORMAL LOW (ref 3.5–5.0)
Alkaline Phosphatase: 105 U/L (ref 38–126)
Anion gap: 10 (ref 5–15)
BUN: 4 mg/dL — ABNORMAL LOW (ref 8–23)
CO2: 25 mmol/L (ref 22–32)
Calcium: 9.1 mg/dL (ref 8.9–10.3)
Chloride: 106 mmol/L (ref 98–111)
Creatinine: 0.76 mg/dL (ref 0.44–1.00)
GFR, Estimated: >60 mL/min (ref >60)
Glucose, Bld: 104 mg/dL — ABNORMAL HIGH (ref 70–99)
Potassium: 3.7 mmol/L (ref 3.5–5.1)
Sodium: 141 mmol/L (ref 135–145)
Total Bilirubin: 0.3 mg/dL (ref 0.3–1.2)
Total Protein: 6.7 g/dL (ref 6.5–8.1)

## 2021-08-15 LAB — CBC WITH DIFFERENTIAL (CANCER CENTER ONLY)
Abs Immature Granulocytes: 0.02 10*3/uL (ref 0.00–0.07)
Basophils Absolute: 0 10*3/uL (ref 0.0–0.1)
Basophils Relative: 1 %
Eosinophils Absolute: 0.2 10*3/uL (ref 0.0–0.5)
Eosinophils Relative: 3 %
HCT: 35.2 % — ABNORMAL LOW (ref 36.0–46.0)
Hemoglobin: 11.5 g/dL — ABNORMAL LOW (ref 12.0–15.0)
Immature Granulocytes: 0 %
Lymphocytes Relative: 25 %
Lymphs Abs: 2.1 10*3/uL (ref 0.7–4.0)
MCH: 31.8 pg (ref 26.0–34.0)
MCHC: 32.7 g/dL (ref 30.0–36.0)
MCV: 97.2 fL (ref 80.0–100.0)
Monocytes Absolute: 0.4 10*3/uL (ref 0.1–1.0)
Monocytes Relative: 5 %
Neutro Abs: 5.6 10*3/uL (ref 1.7–7.7)
Neutrophils Relative %: 66 %
Platelet Count: 283 10*3/uL (ref 150–400)
RBC: 3.62 MIL/uL — ABNORMAL LOW (ref 3.87–5.11)
RDW: 12.7 % (ref 11.5–15.5)
WBC Count: 8.4 10*3/uL (ref 4.0–10.5)
nRBC: 0 % (ref 0.0–0.2)

## 2021-08-15 MED ORDER — SODIUM CHLORIDE 0.9% FLUSH
10.0000 mL | INTRAVENOUS | Status: DC | PRN
  Administered 2021-08-15: 09:00:00 10 mL

## 2021-08-15 MED ORDER — SODIUM CHLORIDE 0.9 % IV SOLN
1200.0000 mg | Freq: Once | INTRAVENOUS | Status: AC
  Administered 2021-08-15: 11:00:00 1200 mg via INTRAVENOUS
  Filled 2021-08-15: qty 20

## 2021-08-15 MED ORDER — HEPARIN SOD (PORK) LOCK FLUSH 100 UNIT/ML IV SOLN
500.0000 [IU] | Freq: Once | INTRAVENOUS | Status: AC | PRN
  Administered 2021-08-15: 12:00:00 500 [IU]

## 2021-08-15 MED ORDER — SODIUM CHLORIDE 0.9% FLUSH
10.0000 mL | INTRAVENOUS | Status: DC | PRN
  Administered 2021-08-15: 12:00:00 10 mL

## 2021-08-15 MED ORDER — SODIUM CHLORIDE 0.9 % IV SOLN: Freq: Once | INTRAVENOUS | Status: AC

## 2021-08-15 NOTE — Patient Instructions (Signed)
[facility]  Discharge Instructions: Thank you for choosing [facility] to provide your oncology and hematology care.   If you have a lab appointment with the [facility], please go directly to the [facility] and check in at the registration area.   Wear comfortable clothing and clothing appropriate for easy access to any Portacath or PICC line.   We strive to give you quality time with your provider. You may need to reschedule your appointment if you arrive late (15 or more minutes).  Arriving late affects you and other patients whose appointments are after yours.  Also, if you miss three or more appointments without notifying the office, you may be dismissed from the clinic at the providers discretion.      For prescription refill requests, have your pharmacy contact our office and allow 72 hours for refills to be completed.    Today you received the following chemotherapy and/or immunotherapy agents Tecentriq and IV fluids      To help prevent nausea and vomiting after your treatment, we encourage you to take your nausea medication as directed.  BELOW ARE SYMPTOMS THAT SHOULD BE REPORTED IMMEDIATELY: *FEVER GREATER THAN 100.4 F (38 C) OR HIGHER *CHILLS OR SWEATING *NAUSEA AND VOMITING THAT IS NOT CONTROLLED WITH YOUR NAUSEA MEDICATION *UNUSUAL SHORTNESS OF BREATH *UNUSUAL BRUISING OR BLEEDING *URINARY PROBLEMS (pain or burning when urinating, or frequent urination) *BOWEL PROBLEMS (unusual diarrhea, constipation, pain near the anus) TENDERNESS IN MOUTH AND THROAT WITH OR WITHOUT PRESENCE OF ULCERS (sore throat, sores in mouth, or a toothache) UNUSUAL RASH, SWELLING OR PAIN  UNUSUAL VAGINAL DISCHARGE OR ITCHING   Items with * indicate a potential emergency and should be followed up as soon as possible or go to the Emergency Department if any problems should occur.  Please show the CHEMOTHERAPY ALERT CARD or IMMUNOTHERAPY ALERT CARD at  check-in to the Emergency Department and triage nurse.  Should you have questions after your visit or need to cancel or reschedule your appointment, please contact [facility]  Dept: [phone]  and follow the prompts.  Office hours are 8:00 a.m. to 4:30 p.m. Monday - Friday. Please note that voicemails left after 4:00 p.m. may not be returned until the following business day.  We are closed weekends and major holidays. You have access to a nurse at all times for urgent questions. Please call the main number to the clinic Dept: [phone] and follow the prompts.   For any non-urgent questions, you may also contact your provider using MyChart. We now offer e-Visits for anyone 41 and older to request care online for non-urgent symptoms. For details visit [email or web address].   Also download the MyChart app! Go to the app store, search "MyChart", open the app, select [facility], and log in with your MyChart username and password.  Due to Covid, a mask is required upon entering the hospital/clinic. If you do not have a mask, one will be given to you upon arrival. For doctor visits, patients may have 1 support person aged 25 or older with them. For treatment visits, patients cannot have anyone with them due to current Covid guidelines and our immunocompromised population.

## 2021-08-15 NOTE — Progress Notes (Signed)
Nutrition Follow-up:  Patient receiving maintenance therapy with Tecentriq for extensive stage small cell lung cancer. She is s/p systemic chemotherapy with carboplatin and etoposide for 6 cycles.   Met with patient during infusion. She reports feeling tired today, she has not been sleeping well. She did not like the stoffers lasagna her husband prepared, pt had a few bites. Patient is hungry and requested [place] sandwich with cola. Her appetite is is good, she reports filling up on sodas. Reports drinking 6-8/day. Patient is drinking CIB, but not regularly. Patient reports receiving contact information for food assistance programs in her county. She has not reached to to any of these.    Medications: reviewed  Labs: Glucose 104, BUN 4  Anthropometrics: Weight 110 lb 4.8 oz today stable   11/23 - 109 lb 4 oz 11/2 - 110 lb 8 oz 10/12 - 111 lb 3.2 oz  9/22 - 111 lb    NUTRITION DIAGNOSIS: Unintended weight loss stable    INTERVENTION:  Patient agreeable to drinking 2 CIB/day and decreasing intake of cola Discussed tips for diarrhea and importance of hydration - handouts provided Take Imodium as prescribed for diarrhea Encouraged pt to contact food assistance programs provided to her Provided food bag from [facility] food pantry Patient has contact information     MONITORING, EVALUATION, GOAL: weight trends, intake    NEXT VISIT: Wednesday January 25 during infusion

## 2021-08-24 ENCOUNTER — Ambulatory Visit (HOSPITAL_COMMUNITY)
Admission: RE | Admit: 2021-08-24 | Discharge: 2021-08-24 | Disposition: A | Discharge status: Home / Self Care | Payer: Medicare Other | Source: Ambulatory Visit | Attending: Physician Assistant | Admitting: Physician Assistant

## 2021-08-24 DIAGNOSIS — R519 Headache, unspecified: Secondary | ICD-10-CM | POA: Diagnosis not present

## 2021-08-24 DIAGNOSIS — W19XXXS Unspecified fall, sequela: Secondary | ICD-10-CM | POA: Diagnosis not present

## 2021-08-24 DIAGNOSIS — C3491 Malignant neoplasm of unspecified part of right bronchus or lung: Secondary | ICD-10-CM | POA: Diagnosis present

## 2021-08-24 MED ORDER — GADOBUTROL 1 MMOL/ML IV SOLN
5.0000 mL | Freq: Once | INTRAVENOUS | Status: AC | PRN
  Administered 2021-08-24: 20:00:00 5 mL via INTRAVENOUS

## 2021-08-28 ENCOUNTER — Telehealth: Payer: Self-pay | Admitting: Physician Assistant

## 2021-08-28 ENCOUNTER — Other Ambulatory Visit: Payer: Self-pay | Admitting: Physician Assistant

## 2021-08-28 DIAGNOSIS — I639 Cerebral infarction, unspecified: Secondary | ICD-10-CM

## 2021-08-28 NOTE — Telephone Encounter (Signed)
A repeat brain MRI was performed given the patient's history of extensive stage small cell lung cancer.  She does not have any history of metastatic disease to the brain, fortunately.  The patient has significant history for vascular disease and history of CVAs.  On her recent repeat brain MRI from December 2022, the patient had a small punctate focus of a acute/subacute infarct.  The patient is presently not following with a neurologist.  She is taking Plavix intermittently.  Discussed with the patient the purpose of having a neurologist to reduce the risk of future strokes and for close monitoring.  She has not been compliant with following up with neurology in the past.  She cannot remember her neurologist that she saw in the hospital from her prior stroke.  I have placed a referral to [facility] to establish care with a neurologist.  She expressed understanding with the instructions.  Advised her to please be on the look out for a phone call from their office to make an appointment.

## 2021-09-05 ENCOUNTER — Encounter: Payer: Self-pay | Admitting: Internal Medicine

## 2021-09-05 ENCOUNTER — Inpatient Hospital Stay (HOSPITAL_BASED_OUTPATIENT_CLINIC_OR_DEPARTMENT_OTHER): Payer: Medicare Other | Admitting: Internal Medicine

## 2021-09-05 ENCOUNTER — Other Ambulatory Visit: Payer: Self-pay | Admitting: Physician Assistant

## 2021-09-05 ENCOUNTER — Other Ambulatory Visit: Payer: Self-pay | Admitting: Internal Medicine

## 2021-09-05 ENCOUNTER — Other Ambulatory Visit: Payer: Self-pay

## 2021-09-05 ENCOUNTER — Inpatient Hospital Stay: Payer: Medicare Other

## 2021-09-05 ENCOUNTER — Inpatient Hospital Stay: Payer: Medicare Other | Attending: Internal Medicine

## 2021-09-05 VITALS — BP 109/83 | HR 92 | Temp 96.6°F | Resp 18 | Ht 63.0 in | Wt 109.9 lb

## 2021-09-05 DIAGNOSIS — C3491 Malignant neoplasm of unspecified part of right bronchus or lung: Secondary | ICD-10-CM

## 2021-09-05 DIAGNOSIS — Z923 Personal history of irradiation: Secondary | ICD-10-CM | POA: Diagnosis not present

## 2021-09-05 DIAGNOSIS — Z5112 Encounter for antineoplastic immunotherapy: Secondary | ICD-10-CM | POA: Insufficient documentation

## 2021-09-05 DIAGNOSIS — Z7902 Long term (current) use of antithrombotics/antiplatelets: Secondary | ICD-10-CM | POA: Diagnosis not present

## 2021-09-05 DIAGNOSIS — C7889 Secondary malignant neoplasm of other digestive organs: Secondary | ICD-10-CM | POA: Diagnosis not present

## 2021-09-05 DIAGNOSIS — E876 Hypokalemia: Secondary | ICD-10-CM

## 2021-09-05 DIAGNOSIS — M810 Age-related osteoporosis without current pathological fracture: Secondary | ICD-10-CM | POA: Insufficient documentation

## 2021-09-05 DIAGNOSIS — I10 Essential (primary) hypertension: Secondary | ICD-10-CM | POA: Insufficient documentation

## 2021-09-05 DIAGNOSIS — K449 Diaphragmatic hernia without obstruction or gangrene: Secondary | ICD-10-CM | POA: Insufficient documentation

## 2021-09-05 DIAGNOSIS — Z85828 Personal history of other malignant neoplasm of skin: Secondary | ICD-10-CM | POA: Insufficient documentation

## 2021-09-05 DIAGNOSIS — C3411 Malignant neoplasm of upper lobe, right bronchus or lung: Secondary | ICD-10-CM | POA: Insufficient documentation

## 2021-09-05 DIAGNOSIS — E785 Hyperlipidemia, unspecified: Secondary | ICD-10-CM | POA: Insufficient documentation

## 2021-09-05 DIAGNOSIS — Z79899 Other long term (current) drug therapy: Secondary | ICD-10-CM | POA: Diagnosis not present

## 2021-09-05 DIAGNOSIS — G894 Chronic pain syndrome: Secondary | ICD-10-CM | POA: Diagnosis not present

## 2021-09-05 DIAGNOSIS — R519 Headache, unspecified: Secondary | ICD-10-CM

## 2021-09-05 DIAGNOSIS — R5383 Other fatigue: Secondary | ICD-10-CM

## 2021-09-05 LAB — CBC WITH DIFFERENTIAL (CANCER CENTER ONLY)
Abs Immature Granulocytes: 0.03 10*3/uL (ref 0.00–0.07)
Basophils Absolute: 0 10*3/uL (ref 0.0–0.1)
Basophils Relative: 0 %
Eosinophils Absolute: 0.2 10*3/uL (ref 0.0–0.5)
Eosinophils Relative: 3 %
HCT: 34.1 % — ABNORMAL LOW (ref 36.0–46.0)
Hemoglobin: 11.5 g/dL — ABNORMAL LOW (ref 12.0–15.0)
Immature Granulocytes: 0 %
Lymphocytes Relative: 26 %
Lymphs Abs: 1.8 10*3/uL (ref 0.7–4.0)
MCH: 33 pg (ref 26.0–34.0)
MCHC: 33.7 g/dL (ref 30.0–36.0)
MCV: 97.7 fL (ref 80.0–100.0)
Monocytes Absolute: 0.4 10*3/uL (ref 0.1–1.0)
Monocytes Relative: 5 %
Neutro Abs: 4.7 10*3/uL (ref 1.7–7.7)
Neutrophils Relative %: 66 %
Platelet Count: 248 10*3/uL (ref 150–400)
RBC: 3.49 MIL/uL — ABNORMAL LOW (ref 3.87–5.11)
RDW: 13.4 % (ref 11.5–15.5)
WBC Count: 7.2 10*3/uL (ref 4.0–10.5)
nRBC: 0 % (ref 0.0–0.2)

## 2021-09-05 LAB — CMP (CANCER CENTER ONLY)
ALT: 8 U/L (ref 0–44)
AST: 15 U/L (ref 15–41)
Albumin: 3.5 g/dL (ref 3.5–5.0)
Alkaline Phosphatase: 108 U/L (ref 38–126)
Anion gap: 10 (ref 5–15)
BUN: 6 mg/dL — ABNORMAL LOW (ref 8–23)
CO2: 26 mmol/L (ref 22–32)
Calcium: 8.6 mg/dL — ABNORMAL LOW (ref 8.9–10.3)
Chloride: 102 mmol/L (ref 98–111)
Creatinine: 0.76 mg/dL (ref 0.44–1.00)
GFR, Estimated: >60 mL/min (ref >60)
Glucose, Bld: 147 mg/dL — ABNORMAL HIGH (ref 70–99)
Potassium: 2.8 mmol/L — ABNORMAL LOW (ref 3.5–5.1)
Sodium: 138 mmol/L (ref 135–145)
Total Bilirubin: 0.3 mg/dL (ref 0.3–1.2)
Total Protein: 6.4 g/dL — ABNORMAL LOW (ref 6.5–8.1)

## 2021-09-05 LAB — TSH: TSH: 1.156 u[IU]/mL (ref 0.308–3.960)

## 2021-09-05 MED ORDER — OXYCODONE-ACETAMINOPHEN 5-325 MG PO TABS: 1.0000 | ORAL_TABLET | Freq: Four times a day (QID) | ORAL | 0 refills | Status: DC | PRN

## 2021-09-05 MED ORDER — SODIUM CHLORIDE 0.9% FLUSH
10.0000 mL | INTRAVENOUS | Status: DC | PRN
  Administered 2021-09-05: 10 mL

## 2021-09-05 MED ORDER — SODIUM CHLORIDE 0.9 % IV SOLN
1200.0000 mg | Freq: Once | INTRAVENOUS | Status: AC
  Administered 2021-09-05: 1200 mg via INTRAVENOUS
  Filled 2021-09-05: qty 20

## 2021-09-05 MED ORDER — SODIUM CHLORIDE 0.9 % IV SOLN: Freq: Once | INTRAVENOUS | Status: AC

## 2021-09-05 MED ORDER — HEPARIN SOD (PORK) LOCK FLUSH 100 UNIT/ML IV SOLN
500.0000 [IU] | Freq: Once | INTRAVENOUS | Status: AC | PRN
  Administered 2021-09-05: 500 [IU]

## 2021-09-05 MED ORDER — POTASSIUM CHLORIDE 20 MEQ/15ML (10%) PO SOLN: 20.0000 meq | Freq: Two times a day (BID) | ORAL | 0 refills | Status: DC

## 2021-09-05 NOTE — Progress Notes (Signed)
[facility] [phone]   Fax:([phone]  OFFICE PROGRESS NOTE  Debra Roers, MD [address] [place] [address]  DIAGNOSIS: Extensive stage (T2a, N3, [name], large right anterior mediastinal and supraclavicular lymphadenopathy as well as pancreatic and splenic metastasis diagnosed in September 2017.  The patient had disease progression in October 2019.  PRIOR THERAPY:  1) Systemic chemotherapy was carboplatin for AUC of 5 on day 1 and etoposide 100 MG/M2 on days 1, 2 and 3 with Neulasta support. Status post 6 cycles with significant response of her disease. 2) Prophylactic cranial irradiation under the care of Dr. Sondra Come on 12/02/2016. 3) stereotactic radiotherapy to the recurrent right upper lobe pulmonary nodule under the care of Dr. Sondra Come completed November 10, 2017. 4) Retreatment with systemic chemotherapy with carboplatin for AUC of 5 on day 1 and etoposide 100 mg/M2 on days 1, 2 and 3 as well as Tecentriq (Atezolizumab) 1200 mg IV every 3 weeks with Neulasta support.  First dose June 22, 2018 for disease recurrence.  Status post 5 cycles.  Starting from cycle #2 her dose of carboplatin will be reduced to AUC of 4 and etoposide 80 mg/M2 on days 1, 2 and 3 in addition to the regular dose of Tecentriq.  CURRENT THERAPY: Maintenance treatment with single agent Tecentriq 1200 mg IV every 3 weeks.  Status post 49 cycles.  INTERVAL HISTORY: GAE BIHL 61 y.o. female returns to the clinic today for follow-up visit.  The patient is feeling fine today with no concerning complaints.  She has no chest pain, shortness of breath, cough or hemoptysis.  She denied having any fever or chills.  She has no nausea, vomiting, diarrhea or constipation.  She has no headache or visual changes.  Her primary care physician has retired recently.  She was receiving prescription of Valium from him but it was not  helping anyway.  She will try to establish care with another primary care physician.  She is currently on treatment with Plavix and aspirin for history of TIA by neurology.  The patient had MRI of the brain performed recently and she is here for evaluation and discussion of her imaging studies and treatment options.   MEDICAL HISTORY: Past Medical History:  Diagnosis Date   Basal cell carcinoma of cheek    L side of face   Blood type, Rh positive    Cancer (HCC)    Chronic pain syndrome    Depression 08/07/2016   Encounter for antineoplastic chemotherapy 07/17/2016   History of external beam radiation therapy 11/19/16-12/02/16   brain 25 Gy in 10 fractions   Hyperlipidemia    Hypertension    Hypertension 08/07/2016   Osteoporosis    Small cell lung cancer ([facility]) dx'd 05/2016    ALLERGIES:  is allergic to codeine, motrin [ibuprofen], thiazide-type diuretics, and vicodin [hydrocodone-acetaminophen].  MEDICATIONS:  Current Outpatient Medications  Medication Sig Dispense Refill   clopidogrel (PLAVIX) 75 MG tablet Take 1 tablet (75 mg total) by mouth daily. 30 tablet 0   diazepam (VALIUM) 5 MG tablet Take 5 mg by mouth 3 (three) times daily as needed for anxiety.   2   folic acid (FOLVITE) 1 MG tablet Take 1 tablet (1 mg total) by mouth daily. 30 tablet 1   hydrOXYzine (ATARAX/VISTARIL) 10 MG tablet Take 1 tablet (10 mg total) by mouth 3 (three) times daily as needed.  90 tablet 1   lidocaine-prilocaine (EMLA) cream Apply 1 application topically as needed. 30 g 2   oxyCODONE-acetaminophen (PERCOCET) 5-325 MG tablet Take 1 tablet by mouth every 6 (six) hours as needed for severe pain. 30 tablet 0   potassium chloride 20 MEQ/15ML (10%) SOLN Take 15 mLs (20 mEq total) by mouth daily. 150 mL 0   prochlorperazine (COMPAZINE) 10 MG tablet Take 1 tablet (10 mg total) by mouth every 6 (six) hours as needed for nausea or vomiting. 30 tablet 2   traMADol (ULTRAM) 50 MG tablet Take 50 mg by mouth 4 (four)  times daily as needed for moderate pain (confirmed with her Pharmacy-Dr K. Little 03/15 she received #120).     No current facility-administered medications for this visit.    SURGICAL HISTORY:  Past Surgical History:  Procedure Laterality Date   ABDOMINAL HYSTERECTOMY  1981   APPENDECTOMY     at age 49   EYE SURGERY     left   IR GENERIC HISTORICAL  06/03/2016   IR FLUORO GUIDE PORT INSERTION RIGHT 06/03/2016 [facility]   IR GENERIC HISTORICAL  06/03/2016   IR US GUIDE VASC ACCESS RIGHT 06/03/2016 [facility]   SKIN CANCER EXCISION     basal cell carcinoma L side of face    REVIEW OF SYSTEMS:  A comprehensive review of systems was negative except for: Constitutional: positive for fatigue Respiratory: positive for cough   PHYSICAL EXAMINATION: General appearance: alert, cooperative, and no distress Head: Normocephalic, without obvious abnormality, atraumatic Neck: no adenopathy, no JVD, supple, symmetrical, trachea midline, and thyroid not enlarged, symmetric, no tenderness/mass/nodules Lymph nodes: Cervical, supraclavicular, and axillary nodes normal. Resp: clear to auscultation bilaterally Back: symmetric, no curvature. ROM normal. No CVA tenderness. Cardio: regular rate and rhythm, S1, S2 normal, no murmur, click, rub or gallop GI: soft, non-tender; bowel sounds normal; no masses,  no organomegaly Extremities: extremities normal, atraumatic, no cyanosis or edema  ECOG PERFORMANCE STATUS: 1 - Symptomatic but completely ambulatory  Blood pressure 109/83, pulse 92, temperature (!) 96.6 F (35.9 C), temperature source Tympanic, resp. rate 18, height 5\' 3"  (1.6 m), weight 109 lb 14.4 oz (49.9 kg), SpO2 100 %.  LABORATORY DATA: Lab Results  Component Value Date   WBC 8.4 08/15/2021   HGB 11.5 (L) 08/15/2021   HCT 35.2 (L) 08/15/2021   MCV 97.2 08/15/2021   PLT 283 08/15/2021      Chemistry      Component Value Date/Time   NA 141 08/15/2021 0925   NA 141 08/01/2017  0835   K 3.7 08/15/2021 0925   K 3.5 08/01/2017 0835   CL 106 08/15/2021 0925   CO2 25 08/15/2021 0925   CO2 25 08/01/2017 0835   BUN 4 (L) 08/15/2021 0925   BUN 8.0 08/01/2017 0835   CREATININE 0.76 08/15/2021 0925   CREATININE 0.9 08/01/2017 0835      Component Value Date/Time   CALCIUM 9.1 08/15/2021 0925   CALCIUM 9.9 08/01/2017 0835   ALKPHOS 105 08/15/2021 0925   ALKPHOS 142 08/01/2017 0835   AST 13 (L) 08/15/2021 0925   AST 15 08/01/2017 0835   ALT 7 08/15/2021 0925   ALT 19 08/01/2017 0835   BILITOT 0.3 08/15/2021 0925   BILITOT 0.35 08/01/2017 0835       RADIOGRAPHIC STUDIES: CT Chest W Contrast  Result Date: 08/10/2021 CLINICAL DATA:  Small cell lung cancer restaging. Immunotherapy in progress. Prior chemotherapy and radiation therapy. EXAM: CT CHEST, ABDOMEN,  AND PELVIS WITH CONTRAST TECHNIQUE: Multidetector CT imaging of the chest, abdomen and pelvis was performed following the standard protocol during bolus administration of intravenous contrast. CONTRAST:  39mL OMNIPAQUE IOHEXOL 350 MG/ML SOLN COMPARISON:  Multiple exams, including 05/01/2021 FINDINGS: CT CHEST FINDINGS Cardiovascular: Coronary, aortic arch, and branch vessel atherosclerotic vascular disease. There is some chronic thrombus along the wall of the aortic arch. Mediastinum/Nodes: 10 by 7 mm densely calcified right thyroid nodule is unchanged from 05/01/2021. Not clinically significant; no follow-up imaging recommended (ref: J Am Coll Radiol. 2015 Feb;12(2): 143-50). No pathologic adenopathy. Small type 1 hiatal hernia. Lungs/Pleura: Stable pleural-based right apical density with irregular margins, no morphologic change from 05/01/2021, no adjacent bony destructive findings along the right upper ribs. Mild right lower lobe scarring. Musculoskeletal: Stable mild endplate compressions T5, T6, T7, and T8. CT ABDOMEN PELVIS FINDINGS Hepatobiliary: Focal steatosis in segment 4 of the liver adjacent to the falciform  ligament. Gallbladder unremarkable. No significant abnormality observed. No biliary dilatation. Pancreas: Unremarkable Spleen: Unremarkable Adrenals/Urinary Tract: Unremarkable Stomach/Bowel: Upper normal amount of stool in the colon. Vascular/Lymphatic: Substantial systemic atherosclerosis. Possible narrowing of the two left and single right renal arteries, although today's is in exam is not a CT angiogram. Celiac trunk and SMA appear grossly patent although there is some narrowing in the proximal SMA the could be better assessed with angiography if clinically warranted. The inferior mesenteric artery appears patent. No pathologic adenopathy is identified. Reproductive: Uterus absent.  Adnexa unremarkable. Other: Stable central mesenteric stranding extending towards the right lower quadrant mesentery in a manner unchanged from 05/01/2021, but substantially improved from remote prior exams such as 06/09/2018. Musculoskeletal: Chronic superior endplate compression at L2, unchanged. IMPRESSION: 1. Stable right apical density presumably representing fibrosis and treatment related effects. No findings of active malignancy in the chest, abdomen, or pelvis. 2. Stable central mesenteric stranding extending towards the right lower quadrant mesentery. Although unchanged from 05/01/2021 the appearance is substantially improved from 06/09/2018. This could be an inflammatory related to mesenteric panniculitis, or may be a manifestation of treated malignancy. 3. Other imaging findings of potential clinical significance: Substantial systemic atherosclerosis inlet occluding coronary artery calcifications and possible renal artery narrowings (today's exam was not a CT angiogram for detailed characterization). Aortic Atherosclerosis (ICD10-I70.0). Small type 1 hiatal hernia. Remote endplate compressions at T5, T6, T7, T8, and L2. Electronically Signed   By: Van Clines M.D.   On: 08/10/2021 10:37   MR Brain W Wo  Contrast  Result Date: 08/26/2021 CLINICAL DATA:  Small cell lung carcinoma with increased headaches and falls. EXAM: MRI HEAD WITHOUT AND WITH CONTRAST TECHNIQUE: Multiplanar, multiecho pulse sequences of the brain and surrounding structures were obtained without and with intravenous contrast. CONTRAST:  2mL GADAVIST GADOBUTROL 1 MMOL/ML IV SOLN COMPARISON:  Brain MRI 07/22/2020 FINDINGS: Brain: Punctate focus of diffusion restriction in the left occipital lobe. No other diffusion abnormality. 3-5 scattered foci of chronic microhemorrhage. There is an old right occipital infarct. Confluent hyperintense T2-weighted white matter signal. Generalized volume loss without a clear lobar predilection. The midline structures are normal. There is no abnormal contrast enhancement. Vascular: Unchanged abnormal right vertebral artery flow void. Skull and upper cervical spine: Normal calvarium and skull base. Visualized upper cervical spine and soft tissues are normal. Sinuses/Orbits:No paranasal sinus fluid levels or advanced mucosal thickening. No mastoid or middle ear effusion. Normal orbits. IMPRESSION: 1. Punctate focus of diffusion restriction in the left occipital lobe, consistent with a small acute to subacute infarct. No hemorrhage  or mass effect. 2. No intracranial metastatic disease. 3. Old right occipital infarct and findings of chronic microvascular disease. Electronically Signed   By: Ulyses Jarred M.D.   On: 08/26/2021 01:03   CT Abdomen Pelvis W Contrast  Result Date: 08/10/2021 CLINICAL DATA:  Small cell lung cancer restaging. Immunotherapy in progress. Prior chemotherapy and radiation therapy. EXAM: CT CHEST, ABDOMEN, AND PELVIS WITH CONTRAST TECHNIQUE: Multidetector CT imaging of the chest, abdomen and pelvis was performed following the standard protocol during bolus administration of intravenous contrast. CONTRAST:  72mL OMNIPAQUE IOHEXOL 350 MG/ML SOLN COMPARISON:  Multiple exams, including 05/01/2021  FINDINGS: CT CHEST FINDINGS Cardiovascular: Coronary, aortic arch, and branch vessel atherosclerotic vascular disease. There is some chronic thrombus along the wall of the aortic arch. Mediastinum/Nodes: 10 by 7 mm densely calcified right thyroid nodule is unchanged from 05/01/2021. Not clinically significant; no follow-up imaging recommended (ref: J Am Coll Radiol. 2015 Feb;12(2): 143-50). No pathologic adenopathy. Small type 1 hiatal hernia. Lungs/Pleura: Stable pleural-based right apical density with irregular margins, no morphologic change from 05/01/2021, no adjacent bony destructive findings along the right upper ribs. Mild right lower lobe scarring. Musculoskeletal: Stable mild endplate compressions T5, T6, T7, and T8. CT ABDOMEN PELVIS FINDINGS Hepatobiliary: Focal steatosis in segment 4 of the liver adjacent to the falciform ligament. Gallbladder unremarkable. No significant abnormality observed. No biliary dilatation. Pancreas: Unremarkable Spleen: Unremarkable Adrenals/Urinary Tract: Unremarkable Stomach/Bowel: Upper normal amount of stool in the colon. Vascular/Lymphatic: Substantial systemic atherosclerosis. Possible narrowing of the two left and single right renal arteries, although today's is in exam is not a CT angiogram. Celiac trunk and SMA appear grossly patent although there is some narrowing in the proximal SMA the could be better assessed with angiography if clinically warranted. The inferior mesenteric artery appears patent. No pathologic adenopathy is identified. Reproductive: Uterus absent.  Adnexa unremarkable. Other: Stable central mesenteric stranding extending towards the right lower quadrant mesentery in a manner unchanged from 05/01/2021, but substantially improved from remote prior exams such as 06/09/2018. Musculoskeletal: Chronic superior endplate compression at L2, unchanged. IMPRESSION: 1. Stable right apical density presumably representing fibrosis and treatment related effects.  No findings of active malignancy in the chest, abdomen, or pelvis. 2. Stable central mesenteric stranding extending towards the right lower quadrant mesentery. Although unchanged from 05/01/2021 the appearance is substantially improved from 06/09/2018. This could be an inflammatory related to mesenteric panniculitis, or may be a manifestation of treated malignancy. 3. Other imaging findings of potential clinical significance: Substantial systemic atherosclerosis inlet occluding coronary artery calcifications and possible renal artery narrowings (today's exam was not a CT angiogram for detailed characterization). Aortic Atherosclerosis (ICD10-I70.0). Small type 1 hiatal hernia. Remote endplate compressions at T5, T6, T7, T8, and L2. Electronically Signed   By: Van Clines M.D.   On: 08/10/2021 10:37    ASSESSMENT AND PLAN:  This is a very pleasant 61 years old white female with extensive stage small cell lung cancer status post systemic chemotherapy with carbo platinum and etoposide for 6 cycles and the patient rotated her treatment well except for chemotherapy-induced anemia and requirement for PRBCs and platelet transfusion. She had significant improvement in her disease with the chemotherapy. She also had prophylactic cranial irradiation. She also underwent stereotactic radiotherapy to progressive right upper lobe pulmonary nodule. The patient has been in observation for close to 2 years. Repeat CT scan of the chest, abdomen and pelvis performed recently showed evidence for disease progression in the abdomen. The patient started on systemic chemotherapy  again with carboplatin, etoposide and Tecentriq status post 54 cycles.  Starting from cycle #5 the patient is treated with maintenance single agent Tecentriq. The patient continues to tolerate her treatment well with no concerning adverse effects. I recommended for her to proceed with cycle #55 of her treatment today as planned. Her recent MRI of  the brain showed no concerning findings for disease progression to the brain but she has punctate focus of diffusion restriction in the left occipital lobe consistent with a small acute or subacute infarct.  She is currently followed by neurology and on treatment with Plavix and aspirin. For pain management I will give the patient refill of Percocet. For the itching, she is currently on treatment with Atarax and topical hydrocortisone cream. The patient will come back for follow-up visit in 3 weeks for evaluation before the next cycle of her treatment. She was advised to call immediately if she has any other concerning symptoms in the interval.  All questions were answered. The patient knows to call the clinic with any problems, questions or concerns. We can certainly see the patient much sooner if necessary.  Disclaimer: This note was dictated with voice recognition software. Similar sounding words can inadvertently be transcribed and may not be corrected upon review.

## 2021-09-05 NOTE — Patient Instructions (Signed)
[facility]  Discharge Instructions: Thank you for choosing [facility] to provide your oncology and hematology care.   If you have a lab appointment with the [facility], please go directly to the [facility] and check in at the registration area.   Wear comfortable clothing and clothing appropriate for easy access to any Portacath or PICC line.   We strive to give you quality time with your provider. You may need to reschedule your appointment if you arrive late (15 or more minutes).  Arriving late affects you and other patients whose appointments are after yours.  Also, if you miss three or more appointments without notifying the office, you may be dismissed from the clinic at the providers discretion.      For prescription refill requests, have your pharmacy contact our office and allow 72 hours for refills to be completed.    Today you received the following chemotherapy and/or immunotherapy agents Tecentriq    To help prevent nausea and vomiting after your treatment, we encourage you to take your nausea medication as directed.  BELOW ARE SYMPTOMS THAT SHOULD BE REPORTED IMMEDIATELY: *FEVER GREATER THAN 100.4 F (38 C) OR HIGHER *CHILLS OR SWEATING *NAUSEA AND VOMITING THAT IS NOT CONTROLLED WITH YOUR NAUSEA MEDICATION *UNUSUAL SHORTNESS OF BREATH *UNUSUAL BRUISING OR BLEEDING *URINARY PROBLEMS (pain or burning when urinating, or frequent urination) *BOWEL PROBLEMS (unusual diarrhea, constipation, pain near the anus) TENDERNESS IN MOUTH AND THROAT WITH OR WITHOUT PRESENCE OF ULCERS (sore throat, sores in mouth, or a toothache) UNUSUAL RASH, SWELLING OR PAIN  UNUSUAL VAGINAL DISCHARGE OR ITCHING   Items with * indicate a potential emergency and should be followed up as soon as possible or go to the Emergency Department if any problems should occur.  Please show the CHEMOTHERAPY ALERT CARD or IMMUNOTHERAPY ALERT CARD at check-in to the  Emergency Department and triage nurse.  Should you have questions after your visit or need to cancel or reschedule your appointment, please contact [employer]  Dept: [phone]  and follow the prompts.  Office hours are 8:00 a.m. to 4:30 p.m. Monday - Friday. Please note that voicemails left after 4:00 p.m. may not be returned until the following business day.  We are closed weekends and major holidays. You have access to a nurse at all times for urgent questions. Please call the main number to the clinic Dept: [phone] and follow the prompts.   For any non-urgent questions, you may also contact your provider using MyChart. We now offer e-Visits for anyone 37 and older to request care online for non-urgent symptoms. For details visit [email or web address].   Also download the MyChart app! Go to the app store, search "MyChart", open the app, select [facility], and log in with your MyChart username and password.  Due to Covid, a mask is required upon entering the hospital/clinic. If you do not have a mask, one will be given to you upon arrival. For doctor visits, patients may have 1 support person aged 57 or older with them. For treatment visits, patients cannot have anyone with them due to current Covid guidelines and our immunocompromised population.

## 2021-09-12 ENCOUNTER — Telehealth: Payer: Self-pay | Admitting: Behavioral Health

## 2021-09-12 NOTE — Telephone Encounter (Signed)
Spoke w/Pt today to f/u after acute crisis on Tues afternoon. Pt is resting today & sts her Son is doing better. He has been improved mood since returning from the [facility] to apply for a scholarship so he can swim @ the facility.   Pt sts Son needs help securing his medication due to lack of funds. Related to Pt I would contact her Son, get him r/s'd for f/u [facility] appt & Refer him to our [employer] resource via [facility] for financial assistance w/his medications.   Dr. Theodis Shove

## 2021-09-25 ENCOUNTER — Other Ambulatory Visit: Payer: Self-pay

## 2021-09-25 DIAGNOSIS — C3491 Malignant neoplasm of unspecified part of right bronchus or lung: Secondary | ICD-10-CM

## 2021-09-26 ENCOUNTER — Inpatient Hospital Stay: Payer: Medicare Other | Admitting: Dietician

## 2021-09-26 ENCOUNTER — Inpatient Hospital Stay (HOSPITAL_BASED_OUTPATIENT_CLINIC_OR_DEPARTMENT_OTHER): Payer: Medicare Other | Admitting: Internal Medicine

## 2021-09-26 ENCOUNTER — Inpatient Hospital Stay: Payer: Medicare Other

## 2021-09-26 ENCOUNTER — Other Ambulatory Visit: Payer: Self-pay

## 2021-09-26 VITALS — BP 99/77 | HR 93 | Temp 97.4°F | Resp 17 | Ht 63.0 in | Wt 111.0 lb

## 2021-09-26 DIAGNOSIS — C3491 Malignant neoplasm of unspecified part of right bronchus or lung: Secondary | ICD-10-CM

## 2021-09-26 DIAGNOSIS — Z5112 Encounter for antineoplastic immunotherapy: Secondary | ICD-10-CM | POA: Diagnosis not present

## 2021-09-26 DIAGNOSIS — R519 Headache, unspecified: Secondary | ICD-10-CM

## 2021-09-26 DIAGNOSIS — R5383 Other fatigue: Secondary | ICD-10-CM

## 2021-09-26 LAB — CMP (CANCER CENTER ONLY)
ALT: 5 U/L (ref 0–44)
AST: 12 U/L — ABNORMAL LOW (ref 15–41)
Albumin: 3.5 g/dL (ref 3.5–5.0)
Alkaline Phosphatase: 109 U/L (ref 38–126)
Anion gap: 9 (ref 5–15)
BUN: <5 mg/dL — ABNORMAL LOW (ref 8–23)
CO2: 26 mmol/L (ref 22–32)
Calcium: 8.8 mg/dL — ABNORMAL LOW (ref 8.9–10.3)
Chloride: 104 mmol/L (ref 98–111)
Creatinine: 0.75 mg/dL (ref 0.44–1.00)
GFR, Estimated: >60 mL/min (ref >60)
Glucose, Bld: 117 mg/dL — ABNORMAL HIGH (ref 70–99)
Potassium: 3.2 mmol/L — ABNORMAL LOW (ref 3.5–5.1)
Sodium: 139 mmol/L (ref 135–145)
Total Bilirubin: 0.3 mg/dL (ref 0.3–1.2)
Total Protein: 6.4 g/dL — ABNORMAL LOW (ref 6.5–8.1)

## 2021-09-26 LAB — CBC WITH DIFFERENTIAL (CANCER CENTER ONLY)
Abs Immature Granulocytes: 0.01 10*3/uL (ref 0.00–0.07)
Basophils Absolute: 0 10*3/uL (ref 0.0–0.1)
Basophils Relative: 0 %
Eosinophils Absolute: 0.2 10*3/uL (ref 0.0–0.5)
Eosinophils Relative: 3 %
HCT: 34.5 % — ABNORMAL LOW (ref 36.0–46.0)
Hemoglobin: 11.7 g/dL — ABNORMAL LOW (ref 12.0–15.0)
Immature Granulocytes: 0 %
Lymphocytes Relative: 31 %
Lymphs Abs: 2.1 10*3/uL (ref 0.7–4.0)
MCH: 33.4 pg (ref 26.0–34.0)
MCHC: 33.9 g/dL (ref 30.0–36.0)
MCV: 98.6 fL (ref 80.0–100.0)
Monocytes Absolute: 0.4 10*3/uL (ref 0.1–1.0)
Monocytes Relative: 6 %
Neutro Abs: 4.1 10*3/uL (ref 1.7–7.7)
Neutrophils Relative %: 60 %
Platelet Count: 273 10*3/uL (ref 150–400)
RBC: 3.5 MIL/uL — ABNORMAL LOW (ref 3.87–5.11)
RDW: 14 % (ref 11.5–15.5)
WBC Count: 6.9 10*3/uL (ref 4.0–10.5)
nRBC: 0 % (ref 0.0–0.2)

## 2021-09-26 LAB — TSH: TSH: 1.588 u[IU]/mL (ref 0.308–3.960)

## 2021-09-26 MED ORDER — SODIUM CHLORIDE 0.9% FLUSH
10.0000 mL | INTRAVENOUS | Status: DC | PRN
  Administered 2021-09-26: 09:00:00 10 mL

## 2021-09-26 MED ORDER — SODIUM CHLORIDE 0.9% FLUSH
10.0000 mL | INTRAVENOUS | Status: DC | PRN
  Administered 2021-09-26: 12:00:00 10 mL

## 2021-09-26 MED ORDER — SODIUM CHLORIDE 0.9 % IV SOLN: Freq: Once | INTRAVENOUS | Status: AC

## 2021-09-26 MED ORDER — SODIUM CHLORIDE 0.9 % IV SOLN
1200.0000 mg | Freq: Once | INTRAVENOUS | Status: AC
  Administered 2021-09-26: 11:00:00 1200 mg via INTRAVENOUS
  Filled 2021-09-26: qty 20

## 2021-09-26 MED ORDER — HEPARIN SOD (PORK) LOCK FLUSH 100 UNIT/ML IV SOLN
500.0000 [IU] | Freq: Once | INTRAVENOUS | Status: AC | PRN
  Administered 2021-09-26: 12:00:00 500 [IU]

## 2021-09-26 NOTE — Progress Notes (Signed)
[facility] [phone]   [phone]  OFFICE PROGRESS NOTE  [name], MD [address] [place] [address]  DIAGNOSIS: Extensive stage (T2a, N3, [name], large right anterior mediastinal and supraclavicular lymphadenopathy as well as pancreatic and splenic metastasis diagnosed in September 2017.  The patient had disease progression in October 2019.  PRIOR THERAPY:  1) Systemic chemotherapy was carboplatin for AUC of 5 on day 1 and etoposide 100 MG/M2 on days 1, 2 and 3 with Neulasta support. Status post 6 cycles with significant response of her disease. 2) Prophylactic cranial irradiation under the care of Dr. Sondra Come on 12/02/2016. 3) stereotactic radiotherapy to the recurrent right upper lobe pulmonary nodule under the care of Dr. Sondra Come completed November 10, 2017. 4) Retreatment with systemic chemotherapy with carboplatin for AUC of 5 on day 1 and etoposide 100 mg/M2 on days 1, 2 and 3 as well as Tecentriq (Atezolizumab) 1200 mg IV every 3 weeks with Neulasta support.  First dose June 22, 2018 for disease recurrence.  Status post 5 cycles.  Starting from cycle #2 her dose of carboplatin will be reduced to AUC of 4 and etoposide 80 mg/M2 on days 1, 2 and 3 in addition to the regular dose of Tecentriq.  CURRENT THERAPY: Maintenance treatment with single agent Tecentriq 1200 mg IV every 3 weeks.  Status post 50 cycles.  INTERVAL HISTORY: Debra Kidd 61 y.o. female returns to the clinic today for follow-up visit.  The patient is feeling fine today with no concerning complaints except for the baseline shortness of breath increased with exertion with mild fatigue.  She has no chest pain, cough or hemoptysis.  She denied having any fever or chills.  She has no weight loss or night sweats.  She has no headache or visual changes.  She is here today for evaluation before starting cycle #50 one  of her maintenance treatment with Tecentriq  MEDICAL HISTORY: Past Medical History:  Diagnosis Date   Basal cell carcinoma of cheek    L side of face   Blood type, Rh positive    Cancer ([facility])    Chronic pain syndrome    Depression 08/07/2016   Encounter for antineoplastic chemotherapy 07/17/2016   History of external beam radiation therapy 11/19/16-12/02/16   brain 25 Gy in 10 fractions   Hyperlipidemia    Hypertension    Hypertension 08/07/2016   Osteoporosis    Small cell lung cancer ([facility]) dx'd 05/2016    ALLERGIES:  is allergic to codeine, motrin [ibuprofen], thiazide-type diuretics, and vicodin [hydrocodone-acetaminophen].  MEDICATIONS:  Current Outpatient Medications  Medication Sig Dispense Refill   clopidogrel (PLAVIX) 75 MG tablet Take 1 tablet (75 mg total) by mouth daily. 30 tablet 0   diazepam (VALIUM) 5 MG tablet Take 5 mg by mouth 3 (three) times daily as needed for anxiety.   2   folic acid (FOLVITE) 1 MG tablet Take 1 tablet (1 mg total) by mouth daily. 30 tablet 1   lidocaine-prilocaine (EMLA) cream Apply 1 application topically as needed. 30 g 2   oxyCODONE-acetaminophen (PERCOCET) 5-325 MG tablet Take 1 tablet by mouth every 6 (six) hours as needed for severe pain. 30 tablet 0   potassium chloride 20 MEQ/15ML (10%) SOLN Take 15 mLs (20 mEq total) by mouth 2 (two) times daily. 300 mL 0   traMADol (ULTRAM) 50 MG tablet Take 50  mg by mouth 4 (four) times daily as needed for moderate pain (confirmed with her Pharmacy-Dr K. Little 03/15 she received #120).     hydrOXYzine (ATARAX/VISTARIL) 10 MG tablet Take 1 tablet (10 mg total) by mouth 3 (three) times daily as needed. (Patient not taking: Reported on 09/26/2021) 90 tablet 1   prochlorperazine (COMPAZINE) 10 MG tablet Take 1 tablet (10 mg total) by mouth every 6 (six) hours as needed for nausea or vomiting. (Patient not taking: Reported on 09/26/2021) 30 tablet 2   No current facility-administered medications for this  visit.    SURGICAL HISTORY:  Past Surgical History:  Procedure Laterality Date   ABDOMINAL HYSTERECTOMY  1981   APPENDECTOMY     at age 69   EYE SURGERY     left   IR GENERIC HISTORICAL  06/03/2016   IR FLUORO GUIDE PORT INSERTION RIGHT 06/03/2016 [facility]-INTERV RAD   IR GENERIC HISTORICAL  06/03/2016   IR US GUIDE VASC ACCESS RIGHT 06/03/2016 [facility]INTER[facility] RAD   SKIN CANCER EXCISION     basal cell carcinoma L side of face    REVIEW OF SYSTEMS:  A comprehensive review of systems was negative except for: Constitutional: positive for fatigue Respiratory: positive for cough and dyspnea on exertion   PHYSICAL EXAMINATION: General appearance: alert, cooperative, fatigued, and no distress Head: Normocephalic, without obvious abnormality, atraumatic Neck: no adenopathy, no JVD, supple, symmetrical, trachea midline, and thyroid not enlarged, symmetric, no tenderness/mass/nodules Lymph nodes: Cervical, supraclavicular, and axillary nodes normal. Resp: clear to auscultation bilaterally Back: symmetric, no curvature. ROM normal. No CVA tenderness. Cardio: regular rate and rhythm, S1, S2 normal, no murmur, click, rub or gallop GI: soft, non-tender; bowel sounds normal; no masses,  no organomegaly Extremities: extremities normal, atraumatic, no cyanosis or edema  ECOG PERFORMANCE STATUS: 1 - Symptomatic but completely ambulatory  Blood pressure 99/77, pulse 93, temperature (!) 97.4 F (36.3 C), temperature source Tympanic, resp. rate 17, height 5\' 3"  (1.6 m), weight 111 lb (50.3 kg), SpO2 100 %.  LABORATORY DATA: Lab Results  Component Value Date   WBC 6.9 09/26/2021   HGB 11.7 (L) 09/26/2021   HCT 34.5 (L) 09/26/2021   MCV 98.6 09/26/2021   PLT 273 09/26/2021      Chemistry      Component Value Date/Time   NA 138 09/05/2021 0950   NA 141 08/01/2017 0835   K 2.8 (L) 09/05/2021 0950   K 3.5 08/01/2017 0835   CL 102 09/05/2021 0950   CO2 26 09/05/2021 0950   CO2 25 08/01/2017 0835   BUN  6 (L) 09/05/2021 0950   BUN 8.0 08/01/2017 0835   CREATININE 0.76 09/05/2021 0950   CREATININE 0.9 08/01/2017 0835      Component Value Date/Time   CALCIUM 8.6 (L) 09/05/2021 0950   CALCIUM 9.9 08/01/2017 0835   ALKPHOS 108 09/05/2021 0950   ALKPHOS 142 08/01/2017 0835   AST 15 09/05/2021 0950   AST 15 08/01/2017 0835   ALT 8 09/05/2021 0950   ALT 19 08/01/2017 0835   BILITOT 0.3 09/05/2021 0950   BILITOT 0.35 08/01/2017 0835       RADIOGRAPHIC STUDIES: No results found.  ASSESSMENT AND PLAN:  This is a very pleasant 61 years old white female with extensive stage small cell lung cancer status post systemic chemotherapy with carbo platinum and etoposide for 6 cycles and the patient rotated her treatment well except for chemotherapy-induced anemia and requirement for PRBCs and platelet transfusion. She had significant  improvement in her disease with the chemotherapy. She also had prophylactic cranial irradiation. She also underwent stereotactic radiotherapy to progressive right upper lobe pulmonary nodule. The patient has been in observation for close to 2 years. Repeat CT scan of the chest, abdomen and pelvis performed recently showed evidence for disease progression in the abdomen. The patient started on systemic chemotherapy again with carboplatin, etoposide and Tecentriq status post 55 cycles.  Starting from cycle #5 the patient is treated with maintenance single agent Tecentriq. The patient continues to tolerate her treatment with Tecentriq fairly well. I recommended for her to proceed with cycle #56 today as planned. She will come back for follow-up visit in 3 weeks for evaluation before starting cycle #57. For the history of subacute infarct, the patient is followed by neurology and she is currently on Plavix and aspirin. For pain management I will give the patient refill of Percocet. For the itching, she is currently on treatment with Atarax and topical hydrocortisone  cream. She was advised to call immediately if she has any other concerning symptoms in the interval.  All questions were answered. The patient knows to call the clinic with any problems, questions or concerns. We can certainly see the patient much sooner if necessary.  Disclaimer: This note was dictated with voice recognition software. Similar sounding words can inadvertently be transcribed and may not be corrected upon review.

## 2021-09-26 NOTE — Progress Notes (Signed)
Nutrition Follow-up:  Patient receiving maintenance therapy with Tecentriq for extensive stage small cell lung cancer  Met with patient during infusion. She reports appetite is the same, eating 2 meals day with snacks. Patient reports eating well since her son has been cooking. He is a good cook. She is eating a variety of foods (grilled zucchini, squash, pork chops, mushroom soup, turnip greens, lima beans, ham, peanut butter crackers). She is drinking 1 CIB supplement. She continues to drink 6 sodas/day and 1 gatorade. She does not drink water. She denies nausea, vomiting, diarrhea, constipation. She has requested food bag from [employer]. Patient reports her son was recently told her was 1 point away from having diabetes and is asking for education handouts to give to him.   Medications: klor-con, percocet  Labs: K 3.2, Glucose 117, BUN <5  Anthropometrics: Weight 111 lb today stable  12/14 - 110 lb 4.8 oz 11/23 - 109 lb 4 oz 11/2 - 110 lb 8 oz 10/12 - 111 lb 3.2 oz  9/22 - 111 lb   NUTRITION DIAGNOSIS: Unintended weight loss stable   INTERVENTION:  Encouraged snacks with adequate calories and protein - handout with ideas provided Continue drinking CIB, encouraged 2 daily as able Provided Diabetes Plate Method handout from [employer] per pt request for her son One food bag from [facility] pantry provided today     MONITORING, EVALUATION, GOAL: weight trends, intake   NEXT VISIT: Wednesday March 8 during infusion

## 2021-09-26 NOTE — Patient Instructions (Signed)
[facility]  Discharge Instructions: Thank you for choosing [facility] to provide your oncology and hematology care.   If you have a lab appointment with the [facility], please go directly to the [facility] and check in at the registration area.   Wear comfortable clothing and clothing appropriate for easy access to any Portacath or PICC line.   We strive to give you quality time with your provider. You may need to reschedule your appointment if you arrive late (15 or more minutes).  Arriving late affects you and other patients whose appointments are after yours.  Also, if you miss three or more appointments without notifying the office, you may be dismissed from the clinic at the providers discretion.      For prescription refill requests, have your pharmacy contact our office and allow 72 hours for refills to be completed.    Today you received the following chemotherapy and/or immunotherapy agents : Tecentriq      To help prevent nausea and vomiting after your treatment, we encourage you to take your nausea medication as directed.  BELOW ARE SYMPTOMS THAT SHOULD BE REPORTED IMMEDIATELY: *FEVER GREATER THAN 100.4 F (38 C) OR HIGHER *CHILLS OR SWEATING *NAUSEA AND VOMITING THAT IS NOT CONTROLLED WITH YOUR NAUSEA MEDICATION *UNUSUAL SHORTNESS OF BREATH *UNUSUAL BRUISING OR BLEEDING *URINARY PROBLEMS (pain or burning when urinating, or frequent urination) *BOWEL PROBLEMS (unusual diarrhea, constipation, pain near the anus) TENDERNESS IN MOUTH AND THROAT WITH OR WITHOUT PRESENCE OF ULCERS (sore throat, sores in mouth, or a toothache) UNUSUAL RASH, SWELLING OR PAIN  UNUSUAL VAGINAL DISCHARGE OR ITCHING   Items with * indicate a potential emergency and should be followed up as soon as possible or go to the Emergency Department if any problems should occur.  Please show the CHEMOTHERAPY ALERT CARD or IMMUNOTHERAPY ALERT CARD at check-in to  the Emergency Department and triage nurse.  Should you have questions after your visit or need to cancel or reschedule your appointment, please contact [facility]  Dept: [phone]  and follow the prompts.  Office hours are 8:00 a.m. to 4:30 p.m. Monday - Friday. Please note that voicemails left after 4:00 p.m. may not be returned until the following business day.  We are closed weekends and major holidays. You have access to a nurse at all times for urgent questions. Please call the main number to the clinic Dept: [phone] and follow the prompts.   For any non-urgent questions, you may also contact your provider using MyChart. We now offer e-Visits for anyone 24 and older to request care online for non-urgent symptoms. For details visit [email or web address].   Also download the MyChart app! Go to the app store, search "MyChart", open the app, select [facility], and log in with your MyChart username and password.  Due to Covid, a mask is required upon entering the hospital/clinic. If you do not have a mask, one will be given to you upon arrival. For doctor visits, patients may have 1 support person aged 44 or older with them. For treatment visits, patients cannot have anyone with them due to current Covid guidelines and our immunocompromised population.

## 2021-10-15 NOTE — Progress Notes (Signed)
[facility] OFFICE PROGRESS NOTE  [name], MD [address] [place] [address]  DIAGNOSIS: Extensive stage (T2a, N3, [name], large right anterior mediastinal and supraclavicular lymphadenopathy as well as pancreatic and splenic metastasis diagnosed in September 2017.  The patient had disease progression in October 2019.  PRIOR THERAPY: 1) Systemic chemotherapy was carboplatin for AUC of 5 on day 1 and etoposide 100 MG/M2 on days 1, 2 and 3 with Neulasta support. Status post 6 cycles with significant response of her disease. 2) Prophylactic cranial irradiation under the care of Dr. Sondra Come on 12/02/2016. 3) stereotactic radiotherapy to the recurrent right upper lobe pulmonary nodule under the care of Dr. Sondra Come completed November 10, 2017. 4) Retreatment with systemic chemotherapy with carboplatin for AUC of 5 on day 1 and etoposide 100 mg/M2 on days 1, 2 and 3 as well as Tecentriq (Atezolizumab) 1200 mg IV every 3 weeks with Neulasta support.  First dose June 22, 2018 for disease recurrence.  Status post 5 cycles.  Starting from cycle #2 her dose of carboplatin will be reduced to AUC of 4 and etoposide 80 mg/M2 on days 1, 2 and 3 in addition to the regular dose of Tecentriq.  CURRENT THERAPY: Maintenance treatment with single agent Tecentriq 1200 mg IV every 3 weeks.  Status post 56 cycles.  INTERVAL HISTORY: Debra Kidd 61 y.o. female returns to the clinic today for a follow-up visit.  The patient is feeling fairly well today without any concerning complaints except for fatigue.  The patient is currently undergoing immunotherapy with Tecentriq and she is tolerating it well except for some dry skin and itching.  She was previously prescribed Atarax but it has been making her drowsy and dizzy more recently and she has not been using it. She uses benadryl if needed, which also helps her allergies. She has had a  decreased appetite and weight loss recently and has been followed by member the nutritionist team.  She was given information for the food pantry at previous appointments and was previously encouraged to drink supplemental drinks. She states she has been using V8 and carnation instant breakfast. She notes that she has a smell aversion to certain foods. The patient does not hydrate well at home and does not drink a lot of fluid.   Denies any recent fever or night sweats.  Denies any new nausea, vomiting, or constipation.  She sometimes has diarrhea if she takes her potassium, reportedly, but nothing unusual. She reports her baseline dyspnea on exertion and denies any chest pain or hemoptysis.  She has a baseline cough.  She continues to smoke 1.5 to 2 packs of cigarettes per day.  She has a lot of neurovascular disease and has previously seen by neurology and strongly encouraged on multiple occasions to follow-up with them. But she denies headaches or visual changes. She reports she had some localized central rib pain a few nights ago but it resolved after a few minutes without any intervention. She is here today for evaluation and repeat blood work before starting cycle #57.    MEDICAL HISTORY: Past Medical History:  Diagnosis Date   Basal cell carcinoma of cheek    L side of face   Blood type, Rh positive    Cancer (HCC)    Chronic pain syndrome    Depression 08/07/2016   Encounter for antineoplastic chemotherapy 07/17/2016   History of external beam  radiation therapy 11/19/16-12/02/16   brain 25 Gy in 10 fractions   Hyperlipidemia    Hypertension    Hypertension 08/07/2016   Osteoporosis    Small cell lung cancer ([facility]) dx'd 05/2016    ALLERGIES:  is allergic to codeine, motrin [ibuprofen], thiazide-type diuretics, and vicodin [hydrocodone-acetaminophen].  MEDICATIONS:  Current Outpatient Medications  Medication Sig Dispense Refill   clopidogrel (PLAVIX) 75 MG tablet Take 1 tablet (75 mg  total) by mouth daily. 30 tablet 0   diazepam (VALIUM) 5 MG tablet Take 5 mg by mouth 3 (three) times daily as needed for anxiety.   2   folic acid (FOLVITE) 1 MG tablet Take 1 tablet (1 mg total) by mouth daily. 30 tablet 1   lidocaine-prilocaine (EMLA) cream Apply 1 application topically as needed. 30 g 2   oxyCODONE-acetaminophen (PERCOCET) 5-325 MG tablet Take 1 tablet by mouth every 6 (six) hours as needed for severe pain. 30 tablet 0   potassium chloride 20 MEQ/15ML (10%) SOLN Take 15 mLs (20 mEq total) by mouth 2 (two) times daily. 300 mL 0   prochlorperazine (COMPAZINE) 10 MG tablet Take 1 tablet (10 mg total) by mouth every 6 (six) hours as needed for nausea or vomiting. 30 tablet 2   traMADol (ULTRAM) 50 MG tablet Take 50 mg by mouth 4 (four) times daily as needed for moderate pain (confirmed with her Pharmacy-Dr K. Little 03/15 she received #120).     hydrOXYzine (ATARAX/VISTARIL) 10 MG tablet Take 1 tablet (10 mg total) by mouth 3 (three) times daily as needed. (Patient not taking: Reported on 10/17/2021) 90 tablet 1   No current facility-administered medications for this visit.    SURGICAL HISTORY:  Past Surgical History:  Procedure Laterality Date   ABDOMINAL HYSTERECTOMY  1981   APPENDECTOMY     at age 28   EYE SURGERY     left   IR GENERIC HISTORICAL  06/03/2016   IR FLUORO GUIDE PORT INSERTION RIGHT 06/03/2016 [facility]INTER[facility]   IR GENERIC HISTORICAL  06/03/2016   IR US GUIDE VASC ACCESS RIGHT 06/03/2016 [facility]   SKIN CANCER EXCISION     basal cell carcinoma L side of face    REVIEW OF SYSTEMS:   Review of Systems  Constitutional: Positive for fatigue and decreased appetite and weight loss. Negative for chills and fever. HENT: Negative for mouth sores, nosebleeds, sore throat and trouble swallowing.   Eyes: Negative for icterus.  Positive for occasional wavy lines in vision. Respiratory: Positive for baseline cough and shortness of breath with exertion. Negative for  hemoptysis and wheezing.   Cardiovascular:  Negative for chest pain and leg swelling. Gastrointestinal: Positive for baseline intermittent diarrhea.  Negative for abdominal pain, nausea, constipation, and vomiting. Genitourinary: Negative for bladder incontinence, difficulty urinating, dysuria, frequency and hematuria.   Musculoskeletal: Negative for gait problem, neck pain and neck stiffness. Skin: Positive for itching. Negative for rash. Neurological: Lower extremity weakness, gradually worsening L>R due to prior stroke.  Positive for occasional lightheadedness. Negative for seizures. Hematological: Negative for adenopathy. Does not bruise/bleed easily. Psychiatric/Behavioral: Negative for confusion, depression and sleep disturbance. The patient is not nervous/anxious      PHYSICAL EXAMINATION:  Blood pressure 109/80, pulse 86, temperature (!) 97.3 F (36.3 C), temperature source Tympanic, resp. rate 17, weight 109 lb 14.4 oz (49.9 kg), SpO2 99 %.  ECOG PERFORMANCE STATUS: 2-3  Physical Exam  Constitutional: Oriented to person, place, and time and chronically ill appearing female appears older  than stated age and in no distress. HENT: Head: Normocephalic and atraumatic. Mouth/Throat: Oropharynx is clear and moist. No oropharyngeal exudate. Eyes: Conjunctivae are normal. Right eye exhibits no discharge. Left eye exhibits no discharge. No scleral icterus. Neck: Normal range of motion. Neck supple. Cardiovascular: Normal rate, regular rhythm, normal heart sounds and intact distal pulses.   Pulmonary/Chest: Effort normal.  Quiet breath sounds in all lung fields.  No respiratory distress. No wheezes. No rales. Abdominal: Soft. Bowel sounds are normal. Exhibits no distension and no mass. There is no tenderness.  Musculoskeletal: Normal range of motion. Exhibits no edema.  Lymphadenopathy:    No cervical adenopathy.  Neurological: Alert and oriented to person, place, and time. Exhibits  muscle wasting. Examined in the wheelchair.  Skin: Skin is warm and dry. No rash noted. Not diaphoretic. No erythema. No pallor.  Psychiatric: Mood, memory and judgment normal. Vitals reviewed.  LABORATORY DATA: Lab Results  Component Value Date   WBC 7.2 10/17/2021   HGB 11.4 (L) 10/17/2021   HCT 34.5 (L) 10/17/2021   MCV 101.8 (H) 10/17/2021   PLT 271 10/17/2021      Chemistry      Component Value Date/Time   NA 139 09/26/2021 0928   NA 141 08/01/2017 0835   K 3.2 (L) 09/26/2021 0928   K 3.5 08/01/2017 0835   CL 104 09/26/2021 0928   CO2 26 09/26/2021 0928   CO2 25 08/01/2017 0835   BUN <5 (L) 09/26/2021 0928   BUN 8.0 08/01/2017 0835   CREATININE 0.75 09/26/2021 0928   CREATININE 0.9 08/01/2017 0835      Component Value Date/Time   CALCIUM 8.8 (L) 09/26/2021 0928   CALCIUM 9.9 08/01/2017 0835   ALKPHOS 109 09/26/2021 0928   ALKPHOS 142 08/01/2017 0835   AST 12 (L) 09/26/2021 0928   AST 15 08/01/2017 0835   ALT 5 09/26/2021 0928   ALT 19 08/01/2017 0835   BILITOT 0.3 09/26/2021 0928   BILITOT 0.35 08/01/2017 0835       RADIOGRAPHIC STUDIES:  No results found.   ASSESSMENT/PLAN:  This is a very pleasant 61 year old Caucasian female with extensive stage small cell lung cancer.  She presented with a right upper lobe lung mass, large right anterior mediastinal and supraclavicular lymphadenopathy as well as a pancreatic and splenic metastasis.  She was diagnosed in September 2017.    The patient underwent systemic chemotherapy with carboplatin and etoposide.  She is status post 6 cycles.  She tolerated treatment well except for chemotherapy-induced anemia which required  pRBCs and platelet transfusions.  She had a significant improvement of her disease with chemotherapy. The patient then underwent prophylactic cranial irradiation. She then underwent stereotactic radiotherapy to the right upper lobe and pulmonary nodule.   She had been on observation for 2 years  before showing evidence of disease progression.    She then was started on systemic chemotherapy with carboplatin, etoposide, and Tecentriq. Starting from cycle #5, she has been on maintenance single agent Tecentriq.  She has been tolerating treatment well. She is status post 56 cycles of Tecentriq total.  Labs reviewed. Recommend that she proceed cycle #57 today scheduled.   I will arrange for restaging CT scan the chest, abdomen, and pelvis prior to starting her next cycle of treatment.  We will see her back for follow-up visit in 3 weeks for evaluation and repeat blood work and to review her scan result before starting cycle #58.  Reviewed nutrition education with her.  She will continue to use steroid cream and benadryl if needed for itching.   Regarding her fleeting episode of chest pain, does not sound cardiac in nature. However, I did review with the patient that she is at risk for cardiac events given her multiple risk factors and history of stroke/vascular disease. I reviewed signs and symptoms of MI with her that would warrant ER visit. She expressed understanding.   The patient was advised to call immediately if she has any concerning symptoms in the interval. The patient voices understanding of current disease status and treatment options and is in agreement with the current care plan. All questions were answered. The patient knows to call the clinic with any problems, questions or concerns. We can certainly see the patient much sooner if necessary        Orders Placed This Encounter  Procedures   CT Chest W Contrast    Standing Status:   Future    Standing Expiration Date:   10/17/2022    Order Specific Question:   If indicated for the ordered procedure, I authorize the administration of contrast media per Radiology protocol    Answer:   Yes    Order Specific Question:   Preferred imaging location?    Answer:   [facility]   CT Abdomen Pelvis W Contrast     Standing Status:   Future    Standing Expiration Date:   10/17/2022    Order Specific Question:   If indicated for the ordered procedure, I authorize the administration of contrast media per Radiology protocol    Answer:   Yes    Order Specific Question:   Preferred imaging location?    Answer:   [facility]    Order Specific Question:   Is Oral Contrast requested for this exam?    Answer:   Yes, Per Radiology protocol     The total time spent in the appointment was 20-29 minutes. Tobe Sos Caryn Gienger, PA-C 10/17/21

## 2021-10-16 ENCOUNTER — Other Ambulatory Visit: Payer: Self-pay | Admitting: Physician Assistant

## 2021-10-16 DIAGNOSIS — C3491 Malignant neoplasm of unspecified part of right bronchus or lung: Secondary | ICD-10-CM

## 2021-10-16 DIAGNOSIS — R5383 Other fatigue: Secondary | ICD-10-CM

## 2021-10-17 ENCOUNTER — Other Ambulatory Visit: Payer: Self-pay

## 2021-10-17 ENCOUNTER — Inpatient Hospital Stay (HOSPITAL_BASED_OUTPATIENT_CLINIC_OR_DEPARTMENT_OTHER): Payer: Medicare Other | Admitting: Physician Assistant

## 2021-10-17 ENCOUNTER — Inpatient Hospital Stay: Payer: Medicare Other

## 2021-10-17 ENCOUNTER — Inpatient Hospital Stay: Payer: Medicare Other | Attending: Internal Medicine

## 2021-10-17 ENCOUNTER — Encounter: Payer: Self-pay | Admitting: Physician Assistant

## 2021-10-17 ENCOUNTER — Other Ambulatory Visit: Payer: Self-pay | Admitting: Physician Assistant

## 2021-10-17 VITALS — BP 109/80 | HR 86 | Temp 97.3°F | Resp 17 | Wt 109.9 lb

## 2021-10-17 DIAGNOSIS — R5383 Other fatigue: Secondary | ICD-10-CM | POA: Insufficient documentation

## 2021-10-17 DIAGNOSIS — D6481 Anemia due to antineoplastic chemotherapy: Secondary | ICD-10-CM | POA: Diagnosis not present

## 2021-10-17 DIAGNOSIS — C3491 Malignant neoplasm of unspecified part of right bronchus or lung: Secondary | ICD-10-CM

## 2021-10-17 DIAGNOSIS — T451X5A Adverse effect of antineoplastic and immunosuppressive drugs, initial encounter: Secondary | ICD-10-CM | POA: Diagnosis not present

## 2021-10-17 DIAGNOSIS — I10 Essential (primary) hypertension: Secondary | ICD-10-CM | POA: Diagnosis not present

## 2021-10-17 DIAGNOSIS — Z85828 Personal history of other malignant neoplasm of skin: Secondary | ICD-10-CM | POA: Insufficient documentation

## 2021-10-17 DIAGNOSIS — E876 Hypokalemia: Secondary | ICD-10-CM

## 2021-10-17 DIAGNOSIS — M810 Age-related osteoporosis without current pathological fracture: Secondary | ICD-10-CM | POA: Diagnosis not present

## 2021-10-17 DIAGNOSIS — E785 Hyperlipidemia, unspecified: Secondary | ICD-10-CM | POA: Diagnosis not present

## 2021-10-17 DIAGNOSIS — R197 Diarrhea, unspecified: Secondary | ICD-10-CM | POA: Insufficient documentation

## 2021-10-17 DIAGNOSIS — Z5112 Encounter for antineoplastic immunotherapy: Secondary | ICD-10-CM | POA: Diagnosis not present

## 2021-10-17 DIAGNOSIS — C3411 Malignant neoplasm of upper lobe, right bronchus or lung: Secondary | ICD-10-CM | POA: Diagnosis not present

## 2021-10-17 DIAGNOSIS — C7889 Secondary malignant neoplasm of other digestive organs: Secondary | ICD-10-CM | POA: Diagnosis not present

## 2021-10-17 LAB — CMP (CANCER CENTER ONLY)
ALT: 11 U/L (ref 0–44)
AST: 22 U/L (ref 15–41)
Albumin: 3.3 g/dL — ABNORMAL LOW (ref 3.5–5.0)
Alkaline Phosphatase: 104 U/L (ref 38–126)
Anion gap: 8 (ref 5–15)
BUN: 6 mg/dL — ABNORMAL LOW (ref 8–23)
CO2: 26 mmol/L (ref 22–32)
Calcium: 8.7 mg/dL — ABNORMAL LOW (ref 8.9–10.3)
Chloride: 104 mmol/L (ref 98–111)
Creatinine: 0.57 mg/dL (ref 0.44–1.00)
GFR, Estimated: >60 mL/min (ref >60)
Glucose, Bld: 100 mg/dL — ABNORMAL HIGH (ref 70–99)
Potassium: 3.2 mmol/L — ABNORMAL LOW (ref 3.5–5.1)
Sodium: 138 mmol/L (ref 135–145)
Total Bilirubin: 0.1 mg/dL — ABNORMAL LOW (ref 0.3–1.2)
Total Protein: 6.3 g/dL — ABNORMAL LOW (ref 6.5–8.1)

## 2021-10-17 LAB — CBC WITH DIFFERENTIAL (CANCER CENTER ONLY)
Abs Immature Granulocytes: 0.02 10*3/uL (ref 0.00–0.07)
Basophils Absolute: 0 10*3/uL (ref 0.0–0.1)
Basophils Relative: 0 %
Eosinophils Absolute: 0.2 10*3/uL (ref 0.0–0.5)
Eosinophils Relative: 3 %
HCT: 34.5 % — ABNORMAL LOW (ref 36.0–46.0)
Hemoglobin: 11.4 g/dL — ABNORMAL LOW (ref 12.0–15.0)
Immature Granulocytes: 0 %
Lymphocytes Relative: 30 %
Lymphs Abs: 2.2 10*3/uL (ref 0.7–4.0)
MCH: 33.6 pg (ref 26.0–34.0)
MCHC: 33 g/dL (ref 30.0–36.0)
MCV: 101.8 fL — ABNORMAL HIGH (ref 80.0–100.0)
Monocytes Absolute: 0.5 10*3/uL (ref 0.1–1.0)
Monocytes Relative: 6 %
Neutro Abs: 4.3 10*3/uL (ref 1.7–7.7)
Neutrophils Relative %: 61 %
Platelet Count: 271 10*3/uL (ref 150–400)
RBC: 3.39 MIL/uL — ABNORMAL LOW (ref 3.87–5.11)
RDW: 14.1 % (ref 11.5–15.5)
WBC Count: 7.2 10*3/uL (ref 4.0–10.5)
nRBC: 0 % (ref 0.0–0.2)

## 2021-10-17 LAB — TSH: TSH: 2.077 u[IU]/mL (ref 0.308–3.960)

## 2021-10-17 MED ORDER — SODIUM CHLORIDE 0.9% FLUSH
10.0000 mL | INTRAVENOUS | Status: DC | PRN
  Administered 2021-10-17: 10 mL

## 2021-10-17 MED ORDER — SODIUM CHLORIDE 0.9 % IV SOLN
1200.0000 mg | Freq: Once | INTRAVENOUS | Status: AC
  Administered 2021-10-17: 1200 mg via INTRAVENOUS
  Filled 2021-10-17: qty 20

## 2021-10-17 MED ORDER — POTASSIUM CHLORIDE 20 MEQ/15ML (10%) PO SOLN: 20.0000 meq | Freq: Every day | ORAL | 0 refills | Status: DC

## 2021-10-17 MED ORDER — SODIUM CHLORIDE 0.9 % IV SOLN: Freq: Once | INTRAVENOUS | Status: AC

## 2021-10-17 MED ORDER — HEPARIN SOD (PORK) LOCK FLUSH 100 UNIT/ML IV SOLN
500.0000 [IU] | Freq: Once | INTRAVENOUS | Status: AC | PRN
  Administered 2021-10-17: 500 [IU]

## 2021-10-17 NOTE — Patient Instructions (Signed)
[facility] ONCOLOGY  Discharge Instructions: Thank you for choosing [facility] to provide your oncology and hematology care.   If you have a lab appointment with the [facility], please go directly to the [facility] and check in at the registration area.   Wear comfortable clothing and clothing appropriate for easy access to any Portacath or PICC line.   We strive to give you quality time with your provider. You may need to reschedule your appointment if you arrive late (15 or more minutes).  Arriving late affects you and other patients whose appointments are after yours.  Also, if you miss three or more appointments without notifying the office, you may be dismissed from the clinic at the providers discretion.      For prescription refill requests, have your pharmacy contact our office and allow 72 hours for refills to be completed.    Today you received the following chemotherapy and/or immunotherapy agents: atezolizumab      To help prevent nausea and vomiting after your treatment, we encourage you to take your nausea medication as directed.  BELOW ARE SYMPTOMS THAT SHOULD BE REPORTED IMMEDIATELY: *FEVER GREATER THAN 100.4 F (38 C) OR HIGHER *CHILLS OR SWEATING *NAUSEA AND VOMITING THAT IS NOT CONTROLLED WITH YOUR NAUSEA MEDICATION *UNUSUAL SHORTNESS OF BREATH *UNUSUAL BRUISING OR BLEEDING *URINARY PROBLEMS (pain or burning when urinating, or frequent urination) *BOWEL PROBLEMS (unusual diarrhea, constipation, pain near the anus) TENDERNESS IN MOUTH AND THROAT WITH OR WITHOUT PRESENCE OF ULCERS (sore throat, sores in mouth, or a toothache) UNUSUAL RASH, SWELLING OR PAIN  UNUSUAL VAGINAL DISCHARGE OR ITCHING   Items with * indicate a potential emergency and should be followed up as soon as possible or go to the Emergency Department if any problems should occur.  Please show the CHEMOTHERAPY ALERT CARD or IMMUNOTHERAPY ALERT CARD at check-in  to the Emergency Department and triage nurse.  Should you have questions after your visit or need to cancel or reschedule your appointment, please contact [facility]  Dept: [phone]  and follow the prompts.  Office hours are 8:00 a.m. to 4:30 p.m. Monday - Friday. Please note that voicemails left after 4:00 p.m. may not be returned until the following business day.  We are closed weekends and major holidays. You have access to a nurse at all times for urgent questions. Please call the main number to the clinic Dept: [phone] and follow the prompts.   For any non-urgent questions, you may also contact your provider using MyChart. We now offer e-Visits for anyone 64 and older to request care online for non-urgent symptoms. For details visit [email or web address].   Also download the MyChart app! Go to the app store, search "MyChart", open the app, select [facility], and log in with your MyChart username and password.  Due to Covid, a mask is required upon entering the hospital/clinic. If you do not have a mask, one will be given to you upon arrival. For doctor visits, patients may have 1 support person aged 2 or older with them. For treatment visits, patients cannot have anyone with them due to current Covid guidelines and our immunocompromised population.

## 2021-10-25 ENCOUNTER — Encounter: Payer: Self-pay | Admitting: Neurology

## 2021-10-25 ENCOUNTER — Ambulatory Visit (INDEPENDENT_AMBULATORY_CARE_PROVIDER_SITE_OTHER): Payer: Medicare Other | Admitting: Neurology

## 2021-10-25 ENCOUNTER — Telehealth: Payer: Self-pay | Admitting: Neurology

## 2021-10-25 VITALS — BP 119/86 | HR 87 | Ht 63.0 in | Wt 107.4 lb

## 2021-10-25 DIAGNOSIS — I639 Cerebral infarction, unspecified: Secondary | ICD-10-CM

## 2021-10-25 DIAGNOSIS — Z8639 Personal history of other endocrine, nutritional and metabolic disease: Secondary | ICD-10-CM | POA: Diagnosis not present

## 2021-10-25 DIAGNOSIS — Z8673 Personal history of transient ischemic attack (TIA), and cerebral infarction without residual deficits: Secondary | ICD-10-CM

## 2021-10-25 NOTE — Progress Notes (Signed)
[facility] [address] [place]. [place] [address] [phone]       OFFICE CONSULT NOTE  [name] Date of Birth:  [birth year] Medical Record Number:  [id number]   Referring MD: Prince Rome PA-C  Reason for Referral: Stroke  HPI: Debra Kidd is a 61 year old [place]bra Kidd seen today for initial office consultation visit.  Debra Kidd is accompanied by Debra Kidd husband.  History is obtained from them and review of electronic medical records and I have personally reviewed available pertinent imaging films in PACS.  Debra Kidd has past medical history of extensive stage(T2a, N3, [name], large right anterior mediastinal and supraclavicular lymphadenopathy as well as pancreatic and splenic metastasis diagnosed in September 2017.  Debra Kidd had disease progression in October 2019.  Debra Kidd has been treated with multiple rounds of chemotherapy, prophylactic cranial irradiation in 2018, stereotactic radiotherapy to Debra recurrent right upper lobe nodule in 2019.  Debra Kidd also has past medical history of hypertension, hyperlipidemia, osteoporosis, chronic pain syndrome.  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  There was a weak diffusion positive area in Debra left parieto-occipital white matter seen only on axial but not on coronal DWI images.  Primary physician has referred Debra Kidd for my evaluation to decide on stroke work-up.  Kidd did have a previous stroke in May 2020 when Debra Kidd presented with left-sided hemiparesis in Debra setting of a positive COVID test though Debra Kidd was not having active symptoms of COVID.  At that time Debra MRI had shown patchy right PCA infarct.  MR angiogram of Debra brain and neck showed  occlusion of Debra right posterior cerebral artery at its origin, severely diminished flow within Debra right vertebral artery which is likely severely stenotic and severe stenosis of proximal left common carotid artery.  Kidd did not follow-up in neurology clinic after that.  Kidd states Debra Kidd has residual left hand weakness from that stroke which has not fully recovered.  Debra Kidd balance is poor and and Debra Kidd has been using a walker.  Debra Kidd has had no recent falls or injuries.  Debra Kidd denies any significant headaches, vertigo, numbness or weakness.  Debra Kidd is currently on immunotherapy for Debra Kidd recurrent lung cancer.  Debra Kidd remains on Plavix which is tolerating well without bruising or bleeding.  Blood pressures well controlled today it is 119/86.  Debra Kidd has no complaints today  ROS:   14 system review of systems is positive for weakness, gait and balance difficulties all other systems negative  PMH:  Past Medical History:  Diagnosis Date   Basal cell carcinoma of cheek    L side of face   Blood type, Rh positive    Cancer (HCC)    Chronic pain syndrome    Depression 08/07/2016   Encounter for antineoplastic chemotherapy 07/17/2016   History of external beam radiation therapy 11/19/16-12/02/16   brain 25 Gy in 10 fractions   Hyperlipidemia    Hypertension    Hypertension 08/07/2016   Osteoporosis    Small cell lung cancer ([place]) dx'd 05/2016    Social History:  Social History   Socioeconomic History   Marital status: Married    Spouse name: Not on file   Number of children: 2   Years of education: Not on file  Highest education level: Not on file  Occupational History   Not on file  Tobacco Use   Smoking status: Every Day    Packs/day: 2.00    Years: 43.00    Pack years: 86.00    Types: Cigarettes   Smokeless tobacco: Never  Vaping Use   Vaping Use: Never used  Substance and Sexual Activity   Alcohol use: Not Currently    Comment: quit drinking beer 02/2016   Drug use: Yes    Frequency: 7.0  times per week    Types: Marijuana   Sexual activity: Not on file  Other Topics Concern   Not on file  Social History Narrative   Married, lives with spouse   2 children   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  Currently on disability   Social Determinants of Health   Financial Resource Strain: High Risk   Difficulty of Paying Living Expenses: Very hard  Food Insecurity: Food Insecurity Present   Worried About Charity fundraiser in [place] Last Year: Sometimes true   Ran Out of Food in [place] Last Year: Sometimes true  Transportation Needs: No Transportation Needs   Lack of Transportation (Medical): No   Lack of Transportation (Non-Medical): No  Physical Activity: Not on file  Stress: Stress Concern Present   Feeling of Stress : To some extent  Social Connections: Moderately Isolated   Frequency of Communication with Friends and Family: More than three times a week   Frequency of Social Gatherings with Friends and Family: More than three times a week   Attends Religious Services: Never   Marine scientist or Organizations: No   Attends Archivist Meetings: Never   Marital Status: Married  Human resources officer Violence: Not on file    Medications:   Current Outpatient Medications on File Prior to Visit  Medication Sig Dispense Refill   clopidogrel (PLAVIX) 75 MG tablet Take 1 tablet (75 mg total) by mouth daily. 30 tablet 0   diazepam (VALIUM) 5 MG tablet Take 5 mg by mouth 3 (three) times daily as needed for anxiety.   2   folic acid (FOLVITE) 1 MG tablet Take 1 tablet (1 mg total) by mouth daily. 30 tablet 1   lidocaine-prilocaine (EMLA) cream Apply 1 application topically as needed. 30 g 2   oxyCODONE-acetaminophen (PERCOCET) 5-325 MG tablet Take 1 tablet by mouth every 6 (six) hours as needed for severe pain. 30 tablet 0   potassium chloride 20 MEQ/15ML (10%) SOLN Take 15 mLs (20 mEq total) by mouth daily. 100 mL 0   prochlorperazine  (COMPAZINE) 10 MG tablet Take 1 tablet (10 mg total) by mouth every 6 (six) hours as needed for nausea or vomiting. 30 tablet 2   traMADol (ULTRAM) 50 MG tablet Take 50 mg by mouth 4 (four) times daily as needed for moderate pain (confirmed with [facility]-Dr K. Little 03/15 Debra Kidd received #[id number]).     No current facility-administered medications on file prior to visit.    Allergies:   Allergies  Allergen Reactions   Codeine Nausea And Vomiting   Motrin [Ibuprofen]     Elevation of BP   [name]     intolerant   Vicodin [Hydrocodone-Acetaminophen] Nausea And Vomiting    Physical Exam General: Frail malnourished looking looking middle-aged Debra Kidd, seated, in no evident distress Head: head normocephalic and atraumatic.   Neck: supple with no carotid or supraclavicular bruits Cardiovascular: regular rate and rhythm,  no murmurs Musculoskeletal: no deformity Skin:  no rash/petichiae IV port in Debra right neck and upper chest. Vascular:  Normal pulses all extremities  Neurologic Exam Mental Status: Awake and fully alert. Oriented to place and time. Recent and remote memory intact. Attention span, concentration and fund of knowledge appropriate. Mood and affect appropriate.  Cranial Nerves: Fundoscopic exam reveals sharp disc margins. Pupils equal, briskly reactive to light. Extraocular movements full without nystagmus. Visual fields full to confrontation. Hearing intact. Facial sensation intact.  Left facial movements limited on Debra left due to previous basal cell carcinoma surgery., tongue, palate moves normally and symmetrically.  Motor: Mild left hemiparesis 4/5 in Debra upper extremity with significant grip weakness and diminished fine motor skills.  Left lower extremity 4+/5 with slightly increased tone.  Normal strength in Debra right. Sensory.: intact to touch , pinprick , position and vibratory sensation.  Coordination: Rapid alternating movements normal in all  extremities. Finger-to-nose and heel-to-shin performed accurately bilaterally. Gait and Station: Arises from chair without difficulty. Stance is slightly stooped l. Gait is wide-based with slight dragging of Debra left foot and uses a walker.  Not able to heel, toe and tandem walk .  Reflexes: 1+ and symmetric. Toes downgoing.   NIHSS  2 Modified Rankin  2   ASSESSMENT: 61 year old Debra Kidd with 2 to 3-day history of left-sided weakness in May 2020 due to right posterior cerebral artery and brainstem as well as MCA/PCA bilateral infarcts of cryptogenic etiology.  Negative work-up for source of embolism at that time.  Recent MRI of Debra brain on 08/24/2021 showing punctate focus of restricted diffusion in Debra left occipital lobe possibly a small subacute infarct which was silent.  Vascular risk factors of old stroke Multifocal extra and intracranial stenosis and hyperlipidemia.    PLAN: I had a long d/w Kidd and Debra Kidd husband about Debra Kidd recent possible tiny silent left occipital stroke, past h/o right PCA infarct in Mar 2020,risk for recurrent stroke/TIAs, personally independently reviewed imaging studies and stroke evaluation results and answered questions.Continue Plavix (clopidogrel ) 75 mg daily  for secondary stroke prevention and maintain strict control of hypertension with blood pressure goal below 130/90, diabetes with hemoglobin A1c goal below 6.5% and lipids with LDL cholesterol goal below 70 mg/dL. I also advised Debra Kidd to eat a healthy diet with plenty of whole grains, cereals, fruits and vegetables, exercise regularly and maintain ideal body weight . Check CT angio brain and neck, lipid panel and HbA1c.Followup in Debra future with my nurse practitioner in 3 months or call earlier if needed.  Greater than 50% time during this 45-minute consultation visit was spent on counseling and coordination of care about Debra Kidd recent stroke and discussion about stroke evaluation, prevention and  treatment and answering questions  Antony Contras, MD Note: This document was prepared with digital dictation and possible smart phrase technology. Any transcriptional errors that result from this process are unintentional.

## 2021-10-25 NOTE — Telephone Encounter (Signed)
medicare order sent to [facility], [facility] they will reach out to the patient to schedule.

## 2021-10-25 NOTE — Patient Instructions (Signed)
I had a long d/w patient abouther recent possible tiny silent left occipital stroke, past h/o right PCA infarct in Mar 2020,risk for recurrent stroke/TIAs, personally independently reviewed imaging studies and stroke evaluation results and answered questions.Continue Plavix (clopidogrel ) 75 mg daily  for secondary stroke prevention and maintain strict control of hypertension with blood pressure goal below 130/90, diabetes with hemoglobin A1c goal below 6.5% and lipids with LDL cholesterol goal below 70 mg/dL. I also advised the patient to eat a healthy diet with plenty of whole grains, cereals, fruits and vegetables, exercise regularly and maintain ideal body weight . Check ct angio brain and neck, lipid panel and HbA1c.Followup in the future with my nurse practitioner in 3 months or call earlier if needed. Stroke Prevention Some medical conditions and behaviors can lead to a higher chance of having a stroke. You can help prevent a stroke by eating healthy, exercising, not smoking, and managing any medical conditions you have. Stroke is a leading cause of functional impairment. Primary prevention is particularly important because a majority of strokes are first-time events. Stroke changes the lives of not only those who experience a stroke but also their family and other caregivers. How can this condition affect me? A stroke is a medical emergency and should be treated right away. A stroke can lead to brain damage and can sometimes be life-threatening. If a person gets medical treatment right away, there is a better chance of surviving and recovering from a stroke. What can increase my risk? The following medical conditions may increase your risk of a stroke: Cardiovascular disease. High blood pressure (hypertension). Diabetes. High cholesterol. Sickle cell disease. Blood clotting disorders (hypercoagulable state). Obesity. Sleep disorders (obstructive sleep apnea). Other risk factors  include: Being older than age 66. Having a history of blood clots, stroke, or mini-stroke (transient ischemic attack, TIA). Genetic factors, such as race, ethnicity, or a family history of stroke. Smoking cigarettes or using other tobacco products. Taking birth control pills, especially if you also use tobacco. Heavy use of alcohol or drugs, especially cocaine and methamphetamine. Physical inactivity. What actions can I take to prevent this? Manage your health conditions High cholesterol levels. Eating a healthy diet is important for preventing high cholesterol. If cholesterol cannot be managed through diet alone, you may need to take medicines. Take any prescribed medicines to control your cholesterol as told by your health care provider. Hypertension. To reduce your risk of stroke, try to keep your blood pressure below 130/80. Eating a healthy diet and exercising regularly are important for controlling blood pressure. If these steps are not enough to manage your blood pressure, you may need to take medicines. Take any prescribed medicines to control hypertension as told by your health care provider. Ask your health care provider if you should monitor your blood pressure at home. Have your blood pressure checked every year, even if your blood pressure is normal. Blood pressure increases with age and some medical conditions. Diabetes. Eating a healthy diet and exercising regularly are important parts of managing your blood sugar (glucose). If your blood sugar cannot be managed through diet and exercise, you may need to take medicines. Take any prescribed medicines to control your diabetes as told by your health care provider. Get evaluated for obstructive sleep apnea. Talk to your health care provider about getting a sleep evaluation if you snore a lot or have excessive sleepiness. Make sure that any other medical conditions you have, such as atrial fibrillation or atherosclerosis, are  managed.  Nutrition Follow instructions from your health care provider about what to eat or drink to help manage your health condition. These instructions may include: Reducing your daily calorie intake. Limiting how much salt (sodium) you use to 1,500 milligrams (mg) each day. Using only healthy fats for cooking, such as olive oil, canola oil, or sunflower oil. Eating healthy foods. You can do this by: Choosing foods that are high in fiber, such as whole grains, and fresh fruits and vegetables. Eating at least 5 servings of fruits and vegetables a day. Try to fill one-half of your plate with fruits and vegetables at each meal. Choosing lean protein foods, such as lean cuts of meat, poultry without skin, fish, tofu, beans, and nuts. Eating low-fat dairy products. Avoiding foods that are high in sodium. This can help lower blood pressure. Avoiding foods that have saturated fat, trans fat, and cholesterol. This can help prevent high cholesterol. Avoiding processed and prepared foods. Counting your daily carbohydrate intake.  Lifestyle If you drink alcohol: Limit how much you have to: 0-1 drink a day for women who are not pregnant. 0-2 drinks a day for men. Know how much alcohol is in your drink. In the [place] one drink equals one 12 oz bottle of beer (388mL), one 5 oz glass of wine (162mL), or one 1 oz glass of hard liquor (69mL). Do not use any products that contain nicotine or tobacco. These products include cigarettes, chewing tobacco, and vaping devices, such as e-cigarettes. If you need help quitting, ask your health care provider. Avoid secondhand smoke. Do not use drugs. Activity  Try to stay at a healthy weight. Get at least 30 minutes of exercise on most days, such as: Fast walking. Biking. Swimming. Medicines Take over-the-counter and prescription medicines only as told by your health care provider. Aspirin or blood thinners (antiplatelets or anticoagulants) may be recommended  to reduce your risk of forming blood clots that can lead to stroke. Avoid taking birth control pills. Talk to your health care provider about the risks of taking birth control pills if: You are over 58 years old. You smoke. You get very bad headaches. You have had a blood clot. Where to find more information American Stroke Association: www.strokeassociation.org Get help right away if: You or a loved one has any symptoms of a stroke. "BE FAST" is an easy way to remember the main warning signs of a stroke: B - Balance. Signs are dizziness, sudden trouble walking, or loss of balance. E - Eyes. Signs are trouble seeing or a sudden change in vision. F - Face. Signs are sudden weakness or numbness of the face, or the face or eyelid drooping on one side. A - Arms. Signs are weakness or numbness in an arm. This happens suddenly and usually on one side of the body. S - Speech. Signs are sudden trouble speaking, slurred speech, or trouble understanding what people say. T - Time. Time to call emergency services. Write down what time symptoms started. You or a loved one has other signs of a stroke, such as: A sudden, severe headache with no known cause. Nausea or vomiting. Seizure. These symptoms may represent a serious problem that is an emergency. Do not wait to see if the symptoms will go away. Get medical help right away. Call your local emergency services (911 in the [place] Do not drive yourself to the hospital. Summary You can help to prevent a stroke by eating healthy, exercising, not smoking, limiting alcohol intake, and managing any  medical conditions you may have. Do not use any products that contain nicotine or tobacco. These include cigarettes, chewing tobacco, and vaping devices, such as e-cigarettes. If you need help quitting, ask your health care provider. Remember "BE FAST" for warning signs of a stroke. Get help right away if you or a loved one has any of these signs. This information  is not intended to replace advice given to you by your health care provider. Make sure you discuss any questions you have with your health care provider. Document Revised: 03/20/2020 Document Reviewed: 03/20/2020 Elsevier Patient Education  [place].

## 2021-10-26 LAB — LIPID PANEL
Chol/HDL Ratio: 6 ratio — ABNORMAL HIGH (ref 0.0–4.4)
Cholesterol, Total: 235 mg/dL — ABNORMAL HIGH (ref 100–199)
HDL: 39 mg/dL — ABNORMAL LOW (ref >39)
LDL Chol Calc (NIH): 155 mg/dL — ABNORMAL HIGH (ref 0–99)
Triglycerides: 223 mg/dL — ABNORMAL HIGH (ref 0–149)
VLDL Cholesterol Cal: 41 mg/dL — ABNORMAL HIGH (ref 5–40)

## 2021-10-26 LAB — HEMOGLOBIN A1C
Est. average glucose Bld gHb Est-mCnc: 100 mg/dL
Hgb A1c MFr Bld: 5.1 % (ref 4.8–5.6)

## 2021-10-29 ENCOUNTER — Telehealth: Payer: Self-pay | Admitting: Neurology

## 2021-10-29 NOTE — Telephone Encounter (Signed)
Attempted to call pt, unable to LVM

## 2021-10-29 NOTE — Telephone Encounter (Signed)
Please call patient, laboratory evaluation showed elevated total cholesterol 235, triglyceride 223, LDL 155  A1c was within normal limit 5.1  I have forwarded lab result to her primary care doctor [name], MD She should contact him for management of her hyperlipidemia

## 2021-10-30 ENCOUNTER — Other Ambulatory Visit: Payer: Self-pay

## 2021-10-30 ENCOUNTER — Other Ambulatory Visit: Payer: Self-pay | Admitting: *Deleted

## 2021-10-30 DIAGNOSIS — C3491 Malignant neoplasm of unspecified part of right bronchus or lung: Secondary | ICD-10-CM

## 2021-10-30 NOTE — Telephone Encounter (Signed)
I spoke to the patient. She verbalized understanding of the lab results. She will contact Dr. Rex Kras to discuss treatment for her elevated cholesterol.

## 2021-11-02 ENCOUNTER — Inpatient Hospital Stay: Admission: RE | Admit: 2021-11-02 | Payer: Medicare Other | Source: Ambulatory Visit

## 2021-11-02 ENCOUNTER — Other Ambulatory Visit: Payer: Self-pay

## 2021-11-02 ENCOUNTER — Ambulatory Visit
Admission: RE | Admit: 2021-11-02 | Discharge: 2021-11-02 | Disposition: A | Discharge status: Home / Self Care | Payer: Medicare Other | Source: Ambulatory Visit | Attending: Neurology | Admitting: Neurology

## 2021-11-02 DIAGNOSIS — C3491 Malignant neoplasm of unspecified part of right bronchus or lung: Secondary | ICD-10-CM

## 2021-11-02 MED ORDER — IOPAMIDOL (ISOVUE-370) INJECTION 76%
75.0000 mL | Freq: Once | INTRAVENOUS | Status: AC | PRN
  Administered 2021-11-02: 75 mL via INTRAVENOUS

## 2021-11-02 MED ORDER — HEPARIN SOD (PORK) LOCK FLUSH 100 UNIT/ML IV SOLN: 500.0000 [IU] | Freq: Once | INTRAVENOUS | Status: DC

## 2021-11-02 MED ORDER — SODIUM CHLORIDE 0.9% FLUSH
10.0000 mL | INTRAVENOUS | Status: DC | PRN
  Administered 2021-11-02: 10 mL via INTRAVENOUS

## 2021-11-05 ENCOUNTER — Ambulatory Visit (HOSPITAL_COMMUNITY)
Admission: RE | Admit: 2021-11-05 | Discharge: 2021-11-05 | Disposition: A | Discharge status: Home / Self Care | Payer: Medicare Other | Source: Ambulatory Visit | Attending: Physician Assistant | Admitting: Physician Assistant

## 2021-11-05 ENCOUNTER — Encounter (HOSPITAL_COMMUNITY): Payer: Self-pay

## 2021-11-05 ENCOUNTER — Other Ambulatory Visit: Payer: Self-pay

## 2021-11-05 DIAGNOSIS — C3491 Malignant neoplasm of unspecified part of right bronchus or lung: Secondary | ICD-10-CM | POA: Diagnosis not present

## 2021-11-05 MED ORDER — IOHEXOL 300 MG/ML  SOLN
100.0000 mL | Freq: Once | INTRAMUSCULAR | Status: AC | PRN
  Administered 2021-11-05: 100 mL via INTRAVENOUS

## 2021-11-05 MED ORDER — HEPARIN SOD (PORK) LOCK FLUSH 100 UNIT/ML IV SOLN
500.0000 [IU] | Freq: Once | INTRAVENOUS | Status: AC
  Administered 2021-11-05: 500 [IU] via INTRAVENOUS

## 2021-11-05 MED ORDER — SODIUM CHLORIDE (PF) 0.9 % IJ SOLN
INTRAMUSCULAR | Status: AC
  Filled 2021-11-05: qty 50

## 2021-11-05 MED ORDER — HEPARIN SOD (PORK) LOCK FLUSH 100 UNIT/ML IV SOLN
INTRAVENOUS | Status: AC
  Filled 2021-11-05: qty 5

## 2021-11-06 ENCOUNTER — Telehealth: Payer: Self-pay | Admitting: *Deleted

## 2021-11-06 MED ORDER — ATORVASTATIN CALCIUM 80 MG PO TABS: 80.0000 mg | ORAL_TABLET | Freq: Every day | ORAL | 5 refills | Status: DC

## 2021-11-06 NOTE — Telephone Encounter (Signed)
-----   Message from Garvin Fila, MD sent at 11/06/2021  9:40 AM EST ----- ?Kindly inform the patient that her cholesterol profile is quite not satisfactory and she needs to be started on Lipitor 80 mg daily to reduce risk for future strokes and heart attacks.  Screening test for diabetes is satisfactory ?CT angiogram of the brain and neck vessels showed multiple areas of blockages in the neck as well as the brain at several places.  Some of the blockages in the brain can explain her previous strokes.  His blockages are likely due to her uncontrolled cholesterol as well as previous history of brain radiation.  She is at significant risk for recurrent strokes in the future and really needs to control her risk factors particularly her cholesterol. ?

## 2021-11-06 NOTE — Telephone Encounter (Addendum)
I spoke to the patient and provided her with the test results below. She understands the importance of getting her cholesterol down. Agreeable to start the recommended medication. She will keep her pending follow up 02/04/22.  ?

## 2021-11-06 NOTE — Addendum Note (Signed)
Addended by: Noberto Retort C on: 11/06/2021 03:19 PM ? ? Modules accepted: Orders ? ?

## 2021-11-06 NOTE — Progress Notes (Signed)
Kindly inform the patient that her cholesterol profile is quite not satisfactory and she needs to be started on Lipitor 80 mg daily to reduce risk for future strokes and heart attacks.  Screening test for diabetes is satisfactory CT angiogram of the brain and neck vessels showed multiple areas of blockages in the neck as well as the brain at several places.  Some of the blockages in the brain can explain her previous strokes.  His blockages are likely due to her uncontrolled cholesterol as well as previous history of brain radiation.  She is at significant risk for recurrent strokes in the future and really needs to control her risk factors particularly her cholesterol.

## 2021-11-07 ENCOUNTER — Inpatient Hospital Stay: Payer: Medicare Other

## 2021-11-07 ENCOUNTER — Inpatient Hospital Stay: Payer: Medicare Other | Admitting: Dietician

## 2021-11-07 ENCOUNTER — Inpatient Hospital Stay: Payer: Medicare Other | Attending: Internal Medicine | Admitting: Internal Medicine

## 2021-11-07 ENCOUNTER — Other Ambulatory Visit: Payer: Self-pay

## 2021-11-07 ENCOUNTER — Encounter: Payer: Self-pay | Admitting: Internal Medicine

## 2021-11-07 VITALS — BP 121/90 | HR 88 | Temp 97.6°F | Resp 17 | Ht 63.0 in | Wt 107.6 lb

## 2021-11-07 DIAGNOSIS — Z5112 Encounter for antineoplastic immunotherapy: Secondary | ICD-10-CM | POA: Diagnosis not present

## 2021-11-07 DIAGNOSIS — C3491 Malignant neoplasm of unspecified part of right bronchus or lung: Secondary | ICD-10-CM

## 2021-11-07 DIAGNOSIS — Z79899 Other long term (current) drug therapy: Secondary | ICD-10-CM | POA: Insufficient documentation

## 2021-11-07 DIAGNOSIS — Z9221 Personal history of antineoplastic chemotherapy: Secondary | ICD-10-CM | POA: Diagnosis not present

## 2021-11-07 DIAGNOSIS — Z85828 Personal history of other malignant neoplasm of skin: Secondary | ICD-10-CM | POA: Insufficient documentation

## 2021-11-07 DIAGNOSIS — C7889 Secondary malignant neoplasm of other digestive organs: Secondary | ICD-10-CM | POA: Diagnosis not present

## 2021-11-07 DIAGNOSIS — C3411 Malignant neoplasm of upper lobe, right bronchus or lung: Secondary | ICD-10-CM | POA: Diagnosis not present

## 2021-11-07 DIAGNOSIS — I2510 Atherosclerotic heart disease of native coronary artery without angina pectoris: Secondary | ICD-10-CM | POA: Insufficient documentation

## 2021-11-07 DIAGNOSIS — Z87891 Personal history of nicotine dependence: Secondary | ICD-10-CM | POA: Insufficient documentation

## 2021-11-07 DIAGNOSIS — M8008XA Age-related osteoporosis with current pathological fracture, vertebra(e), initial encounter for fracture: Secondary | ICD-10-CM | POA: Diagnosis not present

## 2021-11-07 DIAGNOSIS — I69354 Hemiplegia and hemiparesis following cerebral infarction affecting left non-dominant side: Secondary | ICD-10-CM | POA: Diagnosis not present

## 2021-11-07 DIAGNOSIS — E785 Hyperlipidemia, unspecified: Secondary | ICD-10-CM | POA: Insufficient documentation

## 2021-11-07 DIAGNOSIS — M810 Age-related osteoporosis without current pathological fracture: Secondary | ICD-10-CM | POA: Insufficient documentation

## 2021-11-07 DIAGNOSIS — F17210 Nicotine dependence, cigarettes, uncomplicated: Secondary | ICD-10-CM | POA: Insufficient documentation

## 2021-11-07 DIAGNOSIS — R5383 Other fatigue: Secondary | ICD-10-CM

## 2021-11-07 DIAGNOSIS — Z8673 Personal history of transient ischemic attack (TIA), and cerebral infarction without residual deficits: Secondary | ICD-10-CM | POA: Diagnosis not present

## 2021-11-07 DIAGNOSIS — Z923 Personal history of irradiation: Secondary | ICD-10-CM | POA: Diagnosis not present

## 2021-11-07 DIAGNOSIS — F329 Major depressive disorder, single episode, unspecified: Secondary | ICD-10-CM | POA: Diagnosis not present

## 2021-11-07 DIAGNOSIS — I700 Atherosclerosis of aorta: Secondary | ICD-10-CM | POA: Diagnosis not present

## 2021-11-07 LAB — CBC WITH DIFFERENTIAL (CANCER CENTER ONLY)
Abs Immature Granulocytes: 0.01 10*3/uL (ref 0.00–0.07)
Basophils Absolute: 0 10*3/uL (ref 0.0–0.1)
Basophils Relative: 0 %
Eosinophils Absolute: 0.2 10*3/uL (ref 0.0–0.5)
Eosinophils Relative: 3 %
HCT: 35.5 % — ABNORMAL LOW (ref 36.0–46.0)
Hemoglobin: 11.8 g/dL — ABNORMAL LOW (ref 12.0–15.0)
Immature Granulocytes: 0 %
Lymphocytes Relative: 26 %
Lymphs Abs: 1.7 10*3/uL (ref 0.7–4.0)
MCH: 34.5 pg — ABNORMAL HIGH (ref 26.0–34.0)
MCHC: 33.2 g/dL (ref 30.0–36.0)
MCV: 103.8 fL — ABNORMAL HIGH (ref 80.0–100.0)
Monocytes Absolute: 0.4 10*3/uL (ref 0.1–1.0)
Monocytes Relative: 6 %
Neutro Abs: 4.4 10*3/uL (ref 1.7–7.7)
Neutrophils Relative %: 65 %
Platelet Count: 290 10*3/uL (ref 150–400)
RBC: 3.42 MIL/uL — ABNORMAL LOW (ref 3.87–5.11)
RDW: 14.1 % (ref 11.5–15.5)
WBC Count: 6.8 10*3/uL (ref 4.0–10.5)
nRBC: 0 % (ref 0.0–0.2)

## 2021-11-07 LAB — CMP (CANCER CENTER ONLY)
ALT: 8 U/L (ref 0–44)
AST: 15 U/L (ref 15–41)
Albumin: 3.5 g/dL (ref 3.5–5.0)
Alkaline Phosphatase: 118 U/L (ref 38–126)
Anion gap: 6 (ref 5–15)
BUN: <5 mg/dL — ABNORMAL LOW (ref 8–23)
CO2: 28 mmol/L (ref 22–32)
Calcium: 8.9 mg/dL (ref 8.9–10.3)
Chloride: 105 mmol/L (ref 98–111)
Creatinine: 0.7 mg/dL (ref 0.44–1.00)
GFR, Estimated: >60 mL/min (ref >60)
Glucose, Bld: 108 mg/dL — ABNORMAL HIGH (ref 70–99)
Potassium: 3.5 mmol/L (ref 3.5–5.1)
Sodium: 139 mmol/L (ref 135–145)
Total Bilirubin: 0.3 mg/dL (ref 0.3–1.2)
Total Protein: 6.2 g/dL — ABNORMAL LOW (ref 6.5–8.1)

## 2021-11-07 LAB — TSH: TSH: 2.159 u[IU]/mL (ref 0.308–3.960)

## 2021-11-07 MED ORDER — SODIUM CHLORIDE 0.9 % IV SOLN
1200.0000 mg | Freq: Once | INTRAVENOUS | Status: AC
  Administered 2021-11-07: 1200 mg via INTRAVENOUS
  Filled 2021-11-07: qty 20

## 2021-11-07 MED ORDER — SODIUM CHLORIDE 0.9 % IV SOLN: Freq: Once | INTRAVENOUS | Status: AC

## 2021-11-07 MED ORDER — SODIUM CHLORIDE 0.9% FLUSH
10.0000 mL | INTRAVENOUS | Status: DC | PRN
  Administered 2021-11-07: 10 mL

## 2021-11-07 MED ORDER — HEPARIN SOD (PORK) LOCK FLUSH 100 UNIT/ML IV SOLN
500.0000 [IU] | Freq: Once | INTRAVENOUS | Status: AC | PRN
  Administered 2021-11-07: 500 [IU]

## 2021-11-07 NOTE — Progress Notes (Signed)
Nutrition Follow-up: ? ?Patient with extensive stage small cell lung cancer. She is currently receiving maintenance treatment with single agent Tecentriq q3 weeks.  ? ?Met with patient during infusion. She reports decreased appetite. Nothing taste like it should. Patient is sensitive to smells. Her son fried chicken tenders last night. This made her nauseas. Patient reports husband will get her anything she is craving, but she never knows what she wants anymore. Patient drinks CIB or Ensure when she can afford them. Patient has been eating [place] and ham sandwiches. She had broccoli/cheese soup last night. This tasted pretty good, until she got "strangled when it went down the wrong pipe." This happens from time to time since her stroke. Patient is not interested in speech therapy.  ? ?Medications: reviewed ? ?Labs: glucose 108, BUN <5 ? ?Anthropometrics: Weight 107 lb 9.6 oz today stable x 2 weeks ? ?2/23 - 107 lb 6.4 oz ?1/25 - 111 lb  ?12/14 - 110 lb 4.8 oz ?11/23 - 109 lb 4 oz ?11/2 - 110 lb 8 oz ? ? ?NUTRITION DIAGNOSIS: Unintended weight loss stable ? ? ?INTERVENTION:  ?Discussed strategies for altered taste - handout provided ?Suggested patient try baking soda salt water rinses several times daily - recipe provided ?Recommend drinking 2-3 Ensure Plus/equivalent daily  ?One complimentary case of Ensure Plus High Protein provided today  ?One food bag from [facility] pantry provided today  ?  ? ?MONITORING, EVALUATION, GOAL: weight trends, intake ? ? ?NEXT VISIT: Wednesday April 19 during infusion  ? ? ?

## 2021-11-07 NOTE — Progress Notes (Signed)
[facility] [phone]   [phone]  OFFICE PROGRESS NOTE  [name], MD [address] [address]  DIAGNOSIS: Extensive stage (T2a, N3, [name], large right anterior mediastinal and supraclavicular lymphadenopathy as well as pancreatic and splenic metastasis diagnosed in September 2017.  The patient had disease progression in October 2019.  PRIOR THERAPY:  1) Systemic chemotherapy was carboplatin for AUC of 5 on day 1 and etoposide 100 MG/M2 on days 1, 2 and 3 with Neulasta support. Status post 6 cycles with significant response of her disease. 2) Prophylactic cranial irradiation under the care of Dr. Sondra Come on 12/02/2016. 3) stereotactic radiotherapy to the recurrent right upper lobe pulmonary nodule under the care of Dr. Sondra Come completed November 10, 2017. 4) Retreatment with systemic chemotherapy with carboplatin for AUC of 5 on day 1 and etoposide 100 mg/M2 on days 1, 2 and 3 as well as Tecentriq (Atezolizumab) 1200 mg IV every 3 weeks with Neulasta support.  First dose June 22, 2018 for disease recurrence.  Status post 5 cycles.  Starting from cycle #2 her dose of carboplatin will be reduced to AUC of 4 and etoposide 80 mg/M2 on days 1, 2 and 3 in addition to the regular dose of Tecentriq.  CURRENT THERAPY: Maintenance treatment with single agent Tecentriq 1200 mg IV every 3 weeks.  Status post 52 cycles.  INTERVAL HISTORY: Debra Kidd 61 y.o. female returns to the clinic today for follow-up visit.  The patient is feeling fine today with no concerning complaints except for the generalized fatigue.  She also has itching from the immunotherapy but she is able to manage it on her own right now.  She denied having any chest pain, shortness of breath except with exertion with no cough or hemoptysis.  She has no nausea, vomiting but has few episodes of diarrhea after the oral  contrast for the scan and no constipation or abdominal pain.  She denied having any significant weight loss or night sweats but she has lack of appetite.  She is here today for evaluation before starting cycle #53 with repeat CT scan of the chest, abdomen and pelvis for restaging of her disease.  MEDICAL HISTORY: Past Medical History:  Diagnosis Date   Basal cell carcinoma of cheek    L side of face   Blood type, Rh positive    Cancer (HCC)    Chronic pain syndrome    Depression 08/07/2016   Encounter for antineoplastic chemotherapy 07/17/2016   History of external beam radiation therapy 11/19/16-12/02/16   brain 25 Gy in 10 fractions   Hyperlipidemia    Hypertension    Hypertension 08/07/2016   Osteoporosis    Small cell lung cancer ([facility]) dx'd 05/2016    ALLERGIES:  is allergic to codeine, motrin [ibuprofen], thiazide-type diuretics, and vicodin [hydrocodone-acetaminophen].  MEDICATIONS:  Current Outpatient Medications  Medication Sig Dispense Refill   atorvastatin (LIPITOR) 80 MG tablet Take 1 tablet (80 mg total) by mouth daily. 30 tablet 5   clopidogrel (PLAVIX) 75 MG tablet Take 1 tablet (75 mg total) by mouth daily. 30 tablet 0   diazepam (VALIUM) 5 MG tablet Take 5 mg by mouth 3 (three) times daily as needed for anxiety.   2   folic acid (FOLVITE) 1 MG tablet Take 1 tablet (1 mg total) by mouth daily. 30 tablet 1   lidocaine-prilocaine (EMLA) cream  Apply 1 application topically as needed. 30 g 2   oxyCODONE-acetaminophen (PERCOCET) 5-325 MG tablet Take 1 tablet by mouth every 6 (six) hours as needed for severe pain. 30 tablet 0   potassium chloride 20 MEQ/15ML (10%) SOLN Take 15 mLs (20 mEq total) by mouth daily. 100 mL 0   prochlorperazine (COMPAZINE) 10 MG tablet Take 1 tablet (10 mg total) by mouth every 6 (six) hours as needed for nausea or vomiting. 30 tablet 2   traMADol (ULTRAM) 50 MG tablet Take 50 mg by mouth 4 (four) times daily as needed for moderate pain (confirmed with  her Pharmacy-Dr K. Little 03/15 she received #120).     No current facility-administered medications for this visit.    SURGICAL HISTORY:  Past Surgical History:  Procedure Laterality Date   ABDOMINAL HYSTERECTOMY  1981   APPENDECTOMY     at age 6   EYE SURGERY     left   IR GENERIC HISTORICAL  06/03/2016   IR FLUORO GUIDE PORT INSERTION RIGHT 06/03/2016 [facility]   IR GENERIC HISTORICAL  06/03/2016   IR US GUIDE VASC ACCESS RIGHT 06/03/2016 [facility]   SKIN CANCER EXCISION     basal cell carcinoma L side of face    REVIEW OF SYSTEMS:  Constitutional: positive for anorexia and fatigue Eyes: negative Ears, nose, mouth, throat, and face: negative Respiratory: positive for dyspnea on exertion Cardiovascular: negative Gastrointestinal: negative Genitourinary:negative Integument/breast: negative Hematologic/lymphatic: negative Musculoskeletal:positive for muscle weakness Neurological: negative Behavioral/Psych: negative Endocrine: negative Allergic/Immunologic: negative   PHYSICAL EXAMINATION: General appearance: alert, cooperative, fatigued, and no distress Head: Normocephalic, without obvious abnormality, atraumatic Neck: no adenopathy, no JVD, supple, symmetrical, trachea midline, and thyroid not enlarged, symmetric, no tenderness/mass/nodules Lymph nodes: Cervical, supraclavicular, and axillary nodes normal. Resp: clear to auscultation bilaterally Back: symmetric, no curvature. ROM normal. No CVA tenderness. Cardio: regular rate and rhythm, S1, S2 normal, no murmur, click, rub or gallop GI: soft, non-tender; bowel sounds normal; no masses,  no organomegaly Extremities: extremities normal, atraumatic, no cyanosis or edema Neurologic: Alert and oriented X 3, normal strength and tone. Normal symmetric reflexes. Normal coordination and gait  ECOG PERFORMANCE STATUS: 1 - Symptomatic but completely ambulatory  Blood pressure 121/90, pulse 88, temperature 97.6 F (36.4  C), temperature source Tympanic, resp. rate 17, height 5\' 3"  (1.6 m), weight 107 lb 9.6 oz (48.8 kg), SpO2 100 %.  LABORATORY DATA: Lab Results  Component Value Date   WBC 6.8 11/07/2021   HGB 11.8 (L) 11/07/2021   HCT 35.5 (L) 11/07/2021   MCV 103.8 (H) 11/07/2021   PLT 290 11/07/2021      Chemistry      Component Value Date/Time   NA 138 10/17/2021 0850   NA 141 08/01/2017 0835   K 3.2 (L) 10/17/2021 0850   K 3.5 08/01/2017 0835   CL 104 10/17/2021 0850   CO2 26 10/17/2021 0850   CO2 25 08/01/2017 0835   BUN 6 (L) 10/17/2021 0850   BUN 8.0 08/01/2017 0835   CREATININE 0.57 10/17/2021 0850   CREATININE 0.9 08/01/2017 0835      Component Value Date/Time   CALCIUM 8.7 (L) 10/17/2021 0850   CALCIUM 9.9 08/01/2017 0835   ALKPHOS 104 10/17/2021 0850   ALKPHOS 142 08/01/2017 0835   AST 22 10/17/2021 0850   AST 15 08/01/2017 0835   ALT 11 10/17/2021 0850   ALT 19 08/01/2017 0835   BILITOT 0.1 (L) 10/17/2021 0850   BILITOT 0.35 08/01/2017  0960       RADIOGRAPHIC STUDIES: CT ANGIO HEAD NECK W WO CM  Result Date: 11/02/2021 CLINICAL DATA:  Occipital infarction (HCC) I63.9 (ICD-10-CM). EXAM: CT ANGIOGRAPHY HEAD AND NECK TECHNIQUE: Multidetector CT imaging of the head and neck was performed using the standard protocol during bolus administration of intravenous contrast. Multiplanar CT image reconstructions and MIPs were obtained to evaluate the vascular anatomy. Carotid stenosis measurements (when applicable) are obtained utilizing NASCET criteria, using the distal internal carotid diameter as the denominator. RADIATION DOSE REDUCTION: This exam was performed according to the departmental dose-optimization program which includes automated exposure control, adjustment of the mA and/or kV according to patient size and/or use of iterative reconstruction technique. CONTRAST:  55mL ISOVUE-370 IOPAMIDOL (ISOVUE-370) INJECTION 76% COMPARISON:  MRI 08/04/2021. FINDINGS: CT HEAD FINDINGS  Brain: No evidence of acute infarction, hemorrhage, hydrocephalus, extra-axial collection or mass lesion/mass effect. Encephalomalacia and gliosis in the right occipital lobe. Extensive confluent hypodensity of the white matter of the cerebral hemispheres, nonspecific. Left occipital lacunar infarct is better demonstrated on prior MRI. Vascular: Calcified plaques in the bilateral carotid siphons. Skull: Normal. Negative for fracture or focal lesion. Sinuses: Imaged portions are clear. Orbits: No acute finding. Review of the MIP images confirms the above findings CTA NECK FINDINGS Aortic arch: Atherosclerotic changes of the aortic arch with a 7 mm thick soft plaque/mural thrombus and a 4 x 7 mm pseudoaneurysm. Right carotid system: Atherosclerotic changes of the innominate artery and proximal right common carotid artery resulting in mild stenosis. There also atherosclerotic changes of the right carotid bifurcation without hemodynamically significant stenosis. Left carotid system: Atherosclerotic changes along the left common carotid artery, more pronounced in its proximal aspect where there is severe stenosis. Atherosclerotic changes of the left carotid bifurcation without hemodynamically significant stenosis. Vertebral arteries: Atherosclerotic changes of the right subclavian artery resulting in severe stenosis proximally. The origin of the right vertebral artery is maintained. Tapering of the right vertebral artery in the mid V1 segment resulting in severe stenosis with decrease caliber along the entire cervical segment. Atherosclerotic changes of the left subclavian artery resulting in mild stenosis proximally and distally to the left vertebral artery. The dominant left vertebral artery has normal course and caliber throughout its cervical segment. Skeleton: No acute or aggressive findings. Degenerative changes of the temporomandibular joints. Other neck: 9 mm calcified right thyroid nodule for which no follow-up  imaging is recommended. Upper chest: Right apical density, unchanged from CT performed August 09, 2021. Review of the MIP images confirms the above findings CTA HEAD FINDINGS Anterior circulation: Atherosclerotic changes in the bilateral carotid siphons with mild stenosis at the horizontal petrous segment on the left. Luminal irregularity along the right MCA vascular tree with 2 focal areas of moderate stenosis in M3 segments, consistent with intracranial atherosclerotic disease. Mild luminal irregularity of the left MCA vascular tree in bilateral ACA vascular trees without high-grade stenosis. Posterior circulation: Occlusion of right vertebral artery at the dural margin. Atherosclerotic changes of the intracranial left vertebral artery without hemodynamically significant stenosis. Atherosclerotic changes of the basilar artery with moderate stenosis of the mid basilar artery. Occlusion of the proximal right posterior cerebral artery with intermittent reconstitution distally likely via leptomeningeal collaterals. Occlusion of the left posterior cerebral artery at the P1-P2 junction with intermittent reconstitution distally likely via leptomeningeal collaterals. Venous sinuses: As permitted by contrast timing, patent. Anatomic variants: None significant. Review of the MIP images confirms the above findings IMPRESSION: 1. No acute intracranial abnormality. 2. Area of  encephalomalacia and gliosis in the right occipital lobe. 3. Extensive chronic white matter disease. 4. 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 5. Severe atherosclerotic disease of the left common carotid artery resulting in severe stenosis proximally. 6. Severe stenosis of the proximal right subclavian artery. 7. Severe stenosis of the distal V1 segment of the right vertebral artery with occlusion at the V3-V4 junction. 8. Moderate stenosis of the mid basilar artery. 9. 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 10. Moderate stenosis of right M3/MCA branches. Electronically Signed   By: Pedro Earls M.D.   On: 11/02/2021 16:50   CT Chest W Contrast  Result Date: 11/05/2021 CLINICAL DATA:  History of small cell lung cancer prior chemo radiation therapy completed in 2018. Ongoing immunotherapy. EXAM: CT CHEST, ABDOMEN, AND PELVIS WITH CONTRAST TECHNIQUE: Multidetector CT imaging of the chest, abdomen and pelvis was performed following the standard protocol during bolus administration of intravenous contrast. RADIATION DOSE REDUCTION: This exam was performed according to the departmental dose-optimization program which includes automated exposure control, adjustment of the mA and/or kV according to patient size and/or use of iterative reconstruction technique. CONTRAST:  124mL OMNIPAQUE IOHEXOL 300 MG/ML  SOLN COMPARISON:  Multiple priors including most recent CT August 09, 2021. FINDINGS: CT CHEST FINDINGS Cardiovascular: Accessed right chest wall Port-A-Cath with tip near the superior cavoatrial junction. Similar appearance of the chronic thrombus versus noncalcified plaque along the wall of the aortic arch on image 23/2. Aortic atherosclerosis without abdominal aortic aneurysm. No central pulmonary embolus on this nondedicated study. Normal size heart. No significant pericardial effusion/thickening. Mediastinum/Nodes: No supraclavicular adenopathy. Densely calcified 1 cm right thyroid nodules unchanged. Not clinically significant; no follow-up imaging recommended (ref: J Am Coll Radiol. 2015 Feb;12(2): 143-50).No pathologically enlarged mediastinal, hilar or axillary lymph nodes. The trachea and esophagus are grossly unremarkable. Lungs/Pleura: No significant interval change in the pleural-based density with irregular margins in the right lung apex with architectural distortion, similar in morphology dating back to  05/01/2021. No new suspicious pulmonary nodules or masses. Bibasilar atelectasis versus scarring. No pleural effusion. No pneumothorax. Musculoskeletal: Stable mild endplate compression deformities at T5 through T8. No aggressive lytic or blastic lesion of bone. CT ABDOMEN PELVIS FINDINGS Hepatobiliary: Focal fatty infiltration in segment IV adjacent to falciform ligament, similar prior. Gallbladder is unremarkable. No biliary ductal dilation. Pancreas: No pancreatic ductal dilation or evidence of acute inflammation. Spleen: No splenomegaly or focal splenic lesion. Adrenals/Urinary Tract: Bilateral adrenal glands appear normal. No hydronephrosis. Kidneys demonstrate symmetric enhancement and excretion of contrast material. No solid enhancing renal mass. Urinary bladder is unremarkable for degree of distension. Stomach/Bowel: Radiopaque enteric contrast material traverses the rectum. Small hiatal hernia otherwise the stomach is unremarkable for degree of distension. No pathologic dilation of small or large bowel. Terminal ileum appears normal. Moderate volume of formed stool throughout the colon. No evidence of acute bowel inflammation. Vascular/Lymphatic: Aortic and branch vessel atherosclerosis without abdominal aortic aneurysm. No pathologically enlarged abdominal or pelvic lymph nodes. Reproductive: Status post hysterectomy. No adnexal masses. Other: Central mesenteric stranding which extends toward the right lower quadrant mesentery appears similar to multiple recent priors but is improved in appearance compared to more remote priors for instance CT June 09, 2018. No discrete peritoneal or omental nodularity. No significant abdominopelvic free fluid. Musculoskeletal: Similar chronic superior endplate compression deformity at L2. No aggressive lytic or  blastic lesion of bone. IMPRESSION: 1. Stable post treatment appearance of the spiculated pleural-based mass in the peripheral right apex. 2. No evidence of new  or progressive disease in the chest, abdomen or pelvis. 3. Moderate volume of formed stool throughout the colon. Correlate for constipation. 4. Aortic Atherosclerosis (ICD10-I70.0). Electronically Signed   By: Dahlia Bailiff M.D.   On: 11/05/2021 11:50   CT Abdomen Pelvis W Contrast  Result Date: 11/05/2021 CLINICAL DATA:  History of small cell lung cancer prior chemo radiation therapy completed in 2018. Ongoing immunotherapy. EXAM: CT CHEST, ABDOMEN, AND PELVIS WITH CONTRAST TECHNIQUE: Multidetector CT imaging of the chest, abdomen and pelvis was performed following the standard protocol during bolus administration of intravenous contrast. RADIATION DOSE REDUCTION: This exam was performed according to the departmental dose-optimization program which includes automated exposure control, adjustment of the mA and/or kV according to patient size and/or use of iterative reconstruction technique. CONTRAST:  115mL OMNIPAQUE IOHEXOL 300 MG/ML  SOLN COMPARISON:  Multiple priors including most recent CT August 09, 2021. FINDINGS: CT CHEST FINDINGS Cardiovascular: Accessed right chest wall Port-A-Cath with tip near the superior cavoatrial junction. Similar appearance of the chronic thrombus versus noncalcified plaque along the wall of the aortic arch on image 23/2. Aortic atherosclerosis without abdominal aortic aneurysm. No central pulmonary embolus on this nondedicated study. Normal size heart. No significant pericardial effusion/thickening. Mediastinum/Nodes: No supraclavicular adenopathy. Densely calcified 1 cm right thyroid nodules unchanged. Not clinically significant; no follow-up imaging recommended (ref: J Am Coll Radiol. 2015 Feb;12(2): 143-50).No pathologically enlarged mediastinal, hilar or axillary lymph nodes. The trachea and esophagus are grossly unremarkable. Lungs/Pleura: No significant interval change in the pleural-based density with irregular margins in the right lung apex with architectural  distortion, similar in morphology dating back to 05/01/2021. No new suspicious pulmonary nodules or masses. Bibasilar atelectasis versus scarring. No pleural effusion. No pneumothorax. Musculoskeletal: Stable mild endplate compression deformities at T5 through T8. No aggressive lytic or blastic lesion of bone. CT ABDOMEN PELVIS FINDINGS Hepatobiliary: Focal fatty infiltration in segment IV adjacent to falciform ligament, similar prior. Gallbladder is unremarkable. No biliary ductal dilation. Pancreas: No pancreatic ductal dilation or evidence of acute inflammation. Spleen: No splenomegaly or focal splenic lesion. Adrenals/Urinary Tract: Bilateral adrenal glands appear normal. No hydronephrosis. Kidneys demonstrate symmetric enhancement and excretion of contrast material. No solid enhancing renal mass. Urinary bladder is unremarkable for degree of distension. Stomach/Bowel: Radiopaque enteric contrast material traverses the rectum. Small hiatal hernia otherwise the stomach is unremarkable for degree of distension. No pathologic dilation of small or large bowel. Terminal ileum appears normal. Moderate volume of formed stool throughout the colon. No evidence of acute bowel inflammation. Vascular/Lymphatic: Aortic and branch vessel atherosclerosis without abdominal aortic aneurysm. No pathologically enlarged abdominal or pelvic lymph nodes. Reproductive: Status post hysterectomy. No adnexal masses. Other: Central mesenteric stranding which extends toward the right lower quadrant mesentery appears similar to multiple recent priors but is improved in appearance compared to more remote priors for instance CT June 09, 2018. No discrete peritoneal or omental nodularity. No significant abdominopelvic free fluid. Musculoskeletal: Similar chronic superior endplate compression deformity at L2. No aggressive lytic or blastic lesion of bone. IMPRESSION: 1. Stable post treatment appearance of the spiculated pleural-based mass in  the peripheral right apex. 2. No evidence of new or progressive disease in the chest, abdomen or pelvis. 3. Moderate volume of formed stool throughout the colon. Correlate for constipation. 4. Aortic Atherosclerosis (ICD10-I70.0). Electronically Signed   By: Andree Moro.D.  On: 11/05/2021 11:50    ASSESSMENT AND PLAN:  This is a very pleasant 61 years old white female with extensive stage small cell lung cancer status post systemic chemotherapy with carbo platinum and etoposide for 6 cycles and the patient rotated her treatment well except for chemotherapy-induced anemia and requirement for PRBCs and platelet transfusion. She had significant improvement in her disease with the chemotherapy. She also had prophylactic cranial irradiation. She also underwent stereotactic radiotherapy to progressive right upper lobe pulmonary nodule. The patient has been in observation for close to 2 years. Repeat CT scan of the chest, abdomen and pelvis performed recently showed evidence for disease progression in the abdomen. The patient started on systemic chemotherapy again with carboplatin, etoposide and Tecentriq status post 57 cycles.  Starting from cycle #5 the patient is treated with maintenance single agent Tecentriq. She has been tolerating her treatment with Tecentriq fairly well except for the pruritus. She had repeat CT scan of the chest, abdomen pelvis performed recently.  I personally and independently reviewed the scan and discussed the result with the patient today. Her scan showed no concerning findings for disease progression and she continues to have stable disease. I recommended for her to continue her current treatment with Tecentriq every 3 weeks and she will proceed with cycle #58 today. For the history of stroke, she is followed by neurology and she is currently on Plavix and aspirin. For pain management she is currently on Percocet by her primary care physician Dr. Rex Kras. For the  pruritus, the patient is on treatment with Atarax and topical hydrocortisone cream on as-needed basis. She was advised to call immediately if she has any other concerning symptoms in the interval.  All questions were answered. The patient knows to call the clinic with any problems, questions or concerns. We can certainly see the patient much sooner if necessary.  Disclaimer: This note was dictated with voice recognition software. Similar sounding words can inadvertently be transcribed and may not be corrected upon review.

## 2021-11-07 NOTE — Patient Instructions (Signed)
Debra Kidd  Discharge Instructions: ?Thank you for choosing [facility] to provide your oncology and hematology care.  ? ?If you have a lab appointment with the [facility], please go directly to the [facility] and check in at the registration area. ?  ?Wear comfortable clothing and clothing appropriate for easy access to any Portacath or PICC line.  ? ?We strive to give you quality time with your provider. You may need to reschedule your appointment if you arrive late (15 or more minutes).  Arriving late affects you and other patients whose appointments are after yours.  Also, if you miss three or more appointments without notifying the office, you may be dismissed from the clinic at the provider?s discretion.    ?  ?For prescription refill requests, have your pharmacy contact our office and allow 72 hours for refills to be completed.   ? ?Today you received the following chemotherapy and/or immunotherapy agents: atezolizumab    ?  ?To help prevent nausea and vomiting after your treatment, we encourage you to take your nausea medication as directed. ? ?BELOW ARE SYMPTOMS THAT SHOULD BE REPORTED IMMEDIATELY: ?*FEVER GREATER THAN 100.4 F (38 ?C) OR HIGHER ?*CHILLS OR SWEATING ?*NAUSEA AND VOMITING THAT IS NOT CONTROLLED WITH YOUR NAUSEA MEDICATION ?*UNUSUAL SHORTNESS OF BREATH ?*UNUSUAL BRUISING OR BLEEDING ?*URINARY PROBLEMS (pain or burning when urinating, or frequent urination) ?*BOWEL PROBLEMS (unusual diarrhea, constipation, pain near the anus) ?TENDERNESS IN MOUTH AND THROAT WITH OR WITHOUT PRESENCE OF ULCERS (sore throat, sores in mouth, or a toothache) ?UNUSUAL RASH, SWELLING OR PAIN  ?UNUSUAL VAGINAL DISCHARGE OR ITCHING  ? ?Items with * indicate a potential emergency and should be followed up as soon as possible or go to the Emergency Department if any problems should occur. ? ?Please show the CHEMOTHERAPY ALERT CARD or IMMUNOTHERAPY ALERT CARD at check-in  to the Emergency Department and triage nurse. ? ?Should you have questions after your visit or need to cancel or reschedule your appointment, please contact [facility]  Dept: [phone]  and follow the prompts.  Office hours are 8:00 a.m. to 4:30 p.m. Monday - Friday. Please note that voicemails left after 4:00 p.m. may not be returned until the following business day.  We are closed weekends and major holidays. You have access to a nurse at all times for urgent questions. Please call the main number to the clinic Dept: [phone] and follow the prompts. ? ? ?For any non-urgent questions, you may also contact your provider using MyChart. We now offer e-Visits for anyone 90 and older to request care online for non-urgent symptoms. For details visit [email or web address]. ?  ?Also download the MyChart app! Go to the app store, search "MyChart", open the app, select [facility], and log in with your MyChart username and password. ? ?Due to Covid, a mask is required upon entering the hospital/clinic. If you do not have a mask, one will be given to you upon arrival. For doctor visits, patients may have 1 support person aged 29 or older with them. For treatment visits, patients cannot have anyone with them due to current Covid guidelines and our immunocompromised population.  ? ?

## 2021-11-07 NOTE — Patient Instructions (Signed)
Steps to Quit Smoking Smoking tobacco is the leading cause of preventable death. It can affect almost every organ in the body. Smoking puts you and people around you at risk for many serious, long-lasting (chronic) diseases. Quitting smoking can be hard, but it is one of the best things that you can do for your health. It is never too late to quit. How do I get ready to quit? When you decide to quit smoking, make a plan to help you succeed. Before you quit: Pick a date to quit. Set a date within the next 2 weeks to give you time to prepare. Write down the reasons why you are quitting. Keep this list in places where you will see it often. Tell your family, friends, and co-workers that you are quitting. Their support is important. Talk with your doctor about the choices that may help you quit. Find out if your health insurance will pay for these treatments. Know the people, places, things, and activities that make you want to smoke (triggers). Avoid them. What first steps can I take to quit smoking? Throw away all cigarettes at home, at work, and in your car. Throw away the things that you use when you smoke, such as ashtrays and lighters. Clean your car. Make sure to empty the ashtray. Clean your home, including curtains and carpets. What can I do to help me quit smoking? Talk with your doctor about taking medicines and seeing a counselor at the same time. You are more likely to succeed when you do both. If you are pregnant or breastfeeding, talk with your doctor about counseling or other ways to quit smoking. Do not take medicine to help you quit smoking unless your doctor tells you to do so. To quit smoking: Quit right away Quit smoking totally, instead of slowly cutting back on how much you smoke over a period of time. Go to counseling. You are more likely to quit if you go to counseling sessions regularly. Take medicine You may take medicines to help you quit. Some medicines need a  prescription, and some you can buy over-the-counter. Some medicines may contain a drug called nicotine to replace the nicotine in cigarettes. Medicines may: Help you to stop having the desire to smoke (cravings). Help to stop the problems that come when you stop smoking (withdrawal symptoms). Your doctor may ask you to use: Nicotine patches, gum, or lozenges. Nicotine inhalers or sprays. Non-nicotine medicine that is taken by mouth. Find resources Find resources and other ways to help you quit smoking and remain smoke-free after you quit. These resources are most helpful when you use them often. They include: Online chats with a counselor. Phone quitlines. Printed self-help materials. Support groups or group counseling. Text messaging programs. Mobile phone apps. Use apps on your mobile phone or tablet that can help you stick to your quit plan. There are many free apps for mobile phones and tablets as well as websites. Examples include Quit Guide from the CDC and smokefree.gov  What things can I do to make it easier to quit?  Talk to your family and friends. Ask them to support and encourage you. Call a phone quitline (1-800-QUIT-NOW), reach out to support groups, or work with a counselor. Ask people who smoke to not smoke around you. Avoid places that make you want to smoke, such as: Bars. Parties. Smoke-break areas at work. Spend time with people who do not smoke. Lower the stress in your life. Stress can make you want to   smoke. Try these things to help your stress: Getting regular exercise. Doing deep-breathing exercises. Doing yoga. Meditating. Doing a body scan. To do this, close your eyes, focus on one area of your body at a time from head to toe. Notice which parts of your body are tense. Try to relax the muscles in those areas. How will I feel when I quit smoking? Day 1 to 3 weeks Within the first 24 hours, you may start to have some problems that come from quitting tobacco.  These problems are very bad 2-3 days after you quit, but they do not often last for more than 2-3 weeks. You may get these symptoms: Mood swings. Feeling restless, nervous, angry, or annoyed. Trouble concentrating. Dizziness. Strong desire for high-sugar foods and nicotine. Weight gain. Trouble pooping (constipation). Feeling like you may vomit (nausea). Coughing or a sore throat. Changes in how the medicines that you take for other issues work in your body. Depression. Trouble sleeping (insomnia). Week 3 and afterward After the first 2-3 weeks of quitting, you may start to notice more positive results, such as: Better sense of smell and taste. Less coughing and sore throat. Slower heart rate. Lower blood pressure. Clearer skin. Better breathing. Fewer sick days. Quitting smoking can be hard. Do not give up if you fail the first time. Some people need to try a few times before they succeed. Do your best to stick to your quit plan, and talk with your doctor if you have any questions or concerns. Summary Smoking tobacco is the leading cause of preventable death. Quitting smoking can be hard, but it is one of the best things that you can do for your health. When you decide to quit smoking, make a plan to help you succeed. Quit smoking right away, not slowly over a period of time. When you start quitting, seek help from your doctor, family, or friends. This information is not intended to replace advice given to you by your health care provider. Make sure you discuss any questions you have with your health care provider. Document Revised: 04/27/2021 Document Reviewed: 11/07/2018 Elsevier Patient Education  2022 Elsevier Inc.  

## 2021-11-07 NOTE — Progress Notes (Signed)
Approximately 15 minutes into patient's tecentriq infusion, she reported burning in her chest. She says she has not felt this before during any tecentriq infusion and denied any other symptoms. Infusion was paused, vitals assessed and were stable. Dr. Julien Nordmann was contacted and he said infusion could be restarted at a slower rate and to continue to monitor patient. He advised that reactions are rare especially since she has had this drug several times. Port was assessed and blood return noted, no redness noted to chest area.  ? ?Patient tolerated the rest of infusion with no complaints. Upon leaving infusion center, she reported her chest pain had resolved.  ?

## 2021-11-07 NOTE — Addendum Note (Signed)
Addended by: Ardeen Garland on: 11/07/2021 10:06 AM ? ? Modules accepted: Orders ? ?

## 2021-11-26 NOTE — Progress Notes (Signed)
[facility] ?OFFICE PROGRESS NOTE ? ?[name], MD ?[address] ?[place] [address] ? ?DIAGNOSIS: Extensive stage (T2a, N3, [name], large right anterior mediastinal and supraclavicular lymphadenopathy as well as pancreatic and splenic metastasis diagnosed in September 2017.  The patient had disease progression in October 2019. ? ?PRIOR THERAPY: ?1) Systemic chemotherapy was carboplatin for AUC of 5 on day 1 and etoposide 100 MG/M2 on days 1, 2 and 3 with Neulasta support. Status post 6 cycles with significant response of her disease. ?2) Prophylactic cranial irradiation under the care of Dr. Sondra Come on 12/02/2016. ?3) stereotactic radiotherapy to the recurrent right upper lobe pulmonary nodule under the care of Dr. Sondra Come completed November 10, 2017. ?4) Retreatment with systemic chemotherapy with carboplatin for AUC of 5 on day 1 and etoposide 100 mg/M2 on days 1, 2 and 3 as well as Tecentriq (Atezolizumab) 1200 mg IV every 3 weeks with Neulasta support.  First dose June 22, 2018 for disease recurrence.  Status post 5 cycles.  Starting from cycle #2 her dose of carboplatin will be reduced to AUC of 4 and etoposide 80 mg/M2 on days 1, 2 and 3 in addition to the regular dose of Tecentriq. ? ?CURRENT THERAPY: Maintenance treatment with single agent Tecentriq 1200 mg IV every 3 weeks.  Status post 58 cycles. ? ?INTERVAL HISTORY: ?ERISHA PAUGH 61 y.o. female returns to the clinic today for a follow-up visit.  The patient is feeling fairly well today without any concerning complaints except for persistent fatigue and generalized weakness. She has been taking tramadol and benadryl daily. She takes tramadol for chronic back pain. She does have a mild chronic compression fracture in her thoracic spine. She is very deconditioned and is not active at home. She has chronic left sided weakness due to hx of stroke and multiple vascular issues.  She is followed by neurology presently. She does not have a PCP.  ? ?The patient is currently undergoing immunotherapy with Tecentriq and she is tolerating it well except for some occasional dry skin and itching.  She was previously prescribed Atarax but it has been making her drowsy and dizzy more recently and she has not been using it. She uses benadryl if needed, which also helps her allergies but is making her drowsy. She has had a decreased appetite and weight loss recently and has been followed by member the nutritionist team. ? ?She was given information for the food pantry at previous appointments and was previously encouraged to drink supplemental drinks. She states she has been using V8 and carnation instant breakfast. She notes that she has a smell aversion to certain foods. The patient does not hydrate well at home and does not drink a lot of fluid.  ? ?Denies any recent fever or night sweats.  Denies any new nausea, vomiting, or constipation.  She sometimes has diarrhea if she takes her potassium, reportedly, but nothing unusual. She does not take imodium. She reports her baseline dyspnea on exertion and denies any chest pain or hemoptysis.  She has a baseline cough.  She continues to smoke 1.5 to 2 packs of cigarettes per day.  She is here today for evaluation and repeat blood work before starting cycle #59. ? ? ? ?MEDICAL HISTORY: ?Past Medical History:  ?Diagnosis Date  ? Basal cell carcinoma of cheek   ? L side of face  ? Blood type, Rh positive   ?  Cancer [facility])   ? Chronic pain syndrome   ? Depression 08/07/2016  ? Encounter for antineoplastic chemotherapy 07/17/2016  ? History of external beam radiation therapy 11/19/16-12/02/16  ? brain 25 Gy in 10 fractions  ? Hyperlipidemia   ? Hypertension   ? Hypertension 08/07/2016  ? Osteoporosis   ? Small cell lung cancer ([facility]) dx'd 05/2016  ? ? ?ALLERGIES:  is allergic to codeine, motrin [ibuprofen], thiazide-type diuretics, and vicodin  [hydrocodone-acetaminophen]. ? ?MEDICATIONS:  ?Current Outpatient Medications  ?Medication Sig Dispense Refill  ? atorvastatin (LIPITOR) 80 MG tablet Take 1 tablet (80 mg total) by mouth daily. 30 tablet 5  ? clopidogrel (PLAVIX) 75 MG tablet Take 1 tablet (75 mg total) by mouth daily. 30 tablet 0  ? diazepam (VALIUM) 5 MG tablet Take 5 mg by mouth 3 (three) times daily as needed for anxiety.   2  ? lidocaine-prilocaine (EMLA) cream Apply 1 application topically as needed. 30 g 2  ? oxyCODONE-acetaminophen (PERCOCET) 5-325 MG tablet Take 1 tablet by mouth every 6 (six) hours as needed for severe pain. 30 tablet 0  ? potassium chloride 20 MEQ/15ML (10%) SOLN Take 15 mLs (20 mEq total) by mouth daily. 100 mL 0  ? prochlorperazine (COMPAZINE) 10 MG tablet Take 1 tablet (10 mg total) by mouth every 6 (six) hours as needed for nausea or vomiting. 30 tablet 2  ? traMADol (ULTRAM) 50 MG tablet Take 50 mg by mouth 4 (four) times daily as needed for moderate pain (confirmed with her Pharmacy-Dr K. Little 03/15 she received #120).    ? UNABLE TO FIND Med Name: "cholesterol medication"    ? ?No current facility-administered medications for this visit.  ? ? ?SURGICAL HISTORY:  ?Past Surgical History:  ?Procedure Laterality Date  ? ABDOMINAL HYSTERECTOMY  1981  ? APPENDECTOMY    ? at age 35  ? EYE SURGERY    ? left  ? IR GENERIC HISTORICAL  06/03/2016  ? IR FLUORO GUIDE PORT INSERTION RIGHT 06/03/2016 [facility]INTER[facility]  ? IR GENERIC HISTORICAL  06/03/2016  ? IR US GUIDE VASC ACCESS RIGHT 06/03/2016 [facility]INTER[facility]  ? SKIN CANCER EXCISION    ? basal cell carcinoma L side of face  ? ? ?REVIEW OF SYSTEMS:   ?Review of Systems  ?Constitutional: Positive for fatigue and generalized weakness. Negative for appetite change, chills, fever and unexpected weight change.  ?HENT:   Negative for mouth sores, nosebleeds, sore throat and trouble swallowing.   ?Eyes: Negative for eye problems and icterus.  ?Respiratory: Positive for baseline cough and  shortness of breath with exertion. Negative for hemoptysis and wheezing.   ?Cardiovascular:  Negative for chest pain and leg swelling. ?Gastrointestinal: Positive for baseline intermittent diarrhea.  Negative for abdominal pain, nausea, constipation, and vomiting. ?Genitourinary: Negative for bladder incontinence, difficulty urinating, dysuria, frequency and hematuria.   ?Musculoskeletal: Negative for back pain, gait problem, neck pain and neck stiffness.  ?Skin: Negative for itching and rash.  ?Neurological: Lower extremity weakness, gradually worsening L>R due to prior stroke.  Positive for occasional lightheadedness. Negative for seizures. ?Hematological: Negative for adenopathy. Does not bruise/bleed easily.  ?Psychiatric/Behavioral: Negative for confusion, depression and sleep disturbance. The patient is not nervous/anxious.   ? ? ?PHYSICAL EXAMINATION:  ?Blood pressure 93/73, pulse 90, temperature 98.1 ?F (36.7 ?C), temperature source Axillary, resp. rate 17, height 5\' 3"  (1.6 m), weight 108 lb 3.2 oz (49.1 kg), SpO2 100 %. ? ?ECOG PERFORMANCE STATUS: 2-3 ? ?Physical Exam  ?Constitutional: Oriented to  person, place, and time and chronically ill appearing female appears older than stated age and in no distress. ?HENT: ?Head: Normocephalic and atraumatic. ?Mouth/Throat: Oropharynx is clear and moist. No oropharyngeal exudate. ?Eyes: Conjunctivae are normal. Right eye exhibits no discharge. Left eye exhibits no discharge. No scleral icterus. ?Neck: Normal range of motion. Neck supple. ?Cardiovascular: Normal rate, regular rhythm, normal heart sounds and intact distal pulses.   ?Pulmonary/Chest: Effort normal.  Quiet breath sounds in all lung fields.  No respiratory distress. No wheezes. No rales. ?Abdominal: Soft. Bowel sounds are normal. Exhibits no distension and no mass. There is no tenderness.  ?Musculoskeletal: Normal range of motion. Exhibits no edema.  ?Lymphadenopathy:  ?  No cervical adenopathy.   ?Neurological: Alert and oriented to person, place, and time. Exhibits muscle wasting. Examined in the wheelchair. Chronic left sided weakness due to hx of CVA.  ?Skin: Skin is warm and dry. No rash noted. Not diaphoretic. No erythema.

## 2021-11-28 ENCOUNTER — Inpatient Hospital Stay: Payer: Medicare Other

## 2021-11-28 ENCOUNTER — Other Ambulatory Visit: Payer: Self-pay | Admitting: Internal Medicine

## 2021-11-28 ENCOUNTER — Encounter: Payer: Self-pay | Admitting: Physician Assistant

## 2021-11-28 ENCOUNTER — Inpatient Hospital Stay (HOSPITAL_BASED_OUTPATIENT_CLINIC_OR_DEPARTMENT_OTHER): Payer: Medicare Other | Admitting: Physician Assistant

## 2021-11-28 VITALS — BP 93/73 | HR 90 | Temp 98.1°F | Resp 17 | Ht 63.0 in | Wt 108.2 lb

## 2021-11-28 DIAGNOSIS — C3491 Malignant neoplasm of unspecified part of right bronchus or lung: Secondary | ICD-10-CM

## 2021-11-28 DIAGNOSIS — Z5112 Encounter for antineoplastic immunotherapy: Secondary | ICD-10-CM | POA: Diagnosis not present

## 2021-11-28 DIAGNOSIS — R5383 Other fatigue: Secondary | ICD-10-CM

## 2021-11-28 LAB — CBC WITH DIFFERENTIAL (CANCER CENTER ONLY)
Abs Immature Granulocytes: 0.03 10*3/uL (ref 0.00–0.07)
Basophils Absolute: 0 10*3/uL (ref 0.0–0.1)
Basophils Relative: 0 %
Eosinophils Absolute: 0.1 10*3/uL (ref 0.0–0.5)
Eosinophils Relative: 2 %
HCT: 34 % — ABNORMAL LOW (ref 36.0–46.0)
Hemoglobin: 11.4 g/dL — ABNORMAL LOW (ref 12.0–15.0)
Immature Granulocytes: 0 %
Lymphocytes Relative: 18 %
Lymphs Abs: 1.7 10*3/uL (ref 0.7–4.0)
MCH: 35 pg — ABNORMAL HIGH (ref 26.0–34.0)
MCHC: 33.5 g/dL (ref 30.0–36.0)
MCV: 104.3 fL — ABNORMAL HIGH (ref 80.0–100.0)
Monocytes Absolute: 0.6 10*3/uL (ref 0.1–1.0)
Monocytes Relative: 6 %
Neutro Abs: 6.9 10*3/uL (ref 1.7–7.7)
Neutrophils Relative %: 74 %
Platelet Count: 334 10*3/uL (ref 150–400)
RBC: 3.26 MIL/uL — ABNORMAL LOW (ref 3.87–5.11)
RDW: 13.5 % (ref 11.5–15.5)
WBC Count: 9.4 10*3/uL (ref 4.0–10.5)
nRBC: 0 % (ref 0.0–0.2)

## 2021-11-28 LAB — CMP (CANCER CENTER ONLY)
ALT: 16 U/L (ref 0–44)
AST: 29 U/L (ref 15–41)
Albumin: 3 g/dL — ABNORMAL LOW (ref 3.5–5.0)
Alkaline Phosphatase: 134 U/L — ABNORMAL HIGH (ref 38–126)
Anion gap: 6 (ref 5–15)
BUN: 5 mg/dL — ABNORMAL LOW (ref 8–23)
CO2: 29 mmol/L (ref 22–32)
Calcium: 8.5 mg/dL — ABNORMAL LOW (ref 8.9–10.3)
Chloride: 103 mmol/L (ref 98–111)
Creatinine: 0.75 mg/dL (ref 0.44–1.00)
GFR, Estimated: >60 mL/min (ref >60)
Glucose, Bld: 120 mg/dL — ABNORMAL HIGH (ref 70–99)
Potassium: 3.4 mmol/L — ABNORMAL LOW (ref 3.5–5.1)
Sodium: 138 mmol/L (ref 135–145)
Total Bilirubin: 0.2 mg/dL — ABNORMAL LOW (ref 0.3–1.2)
Total Protein: 6.4 g/dL — ABNORMAL LOW (ref 6.5–8.1)

## 2021-11-28 LAB — TSH: TSH: 0.892 u[IU]/mL (ref 0.308–3.960)

## 2021-11-28 MED ORDER — SODIUM CHLORIDE 0.9% FLUSH
10.0000 mL | INTRAVENOUS | Status: DC | PRN
  Administered 2021-11-28: 10 mL

## 2021-11-28 MED ORDER — HEPARIN SOD (PORK) LOCK FLUSH 100 UNIT/ML IV SOLN
500.0000 [IU] | Freq: Once | INTRAVENOUS | Status: AC | PRN
  Administered 2021-11-28: 500 [IU]

## 2021-11-28 MED ORDER — SODIUM CHLORIDE 0.9 % IV SOLN
1200.0000 mg | Freq: Once | INTRAVENOUS | Status: AC
  Administered 2021-11-28: 1200 mg via INTRAVENOUS
  Filled 2021-11-28: qty 20

## 2021-11-28 MED ORDER — SODIUM CHLORIDE 0.9 % IV SOLN: Freq: Once | INTRAVENOUS | Status: AC

## 2021-11-28 MED ORDER — LIDOCAINE-PRILOCAINE 2.5-2.5 % EX CREA: 1.0000 "application " | TOPICAL_CREAM | CUTANEOUS | 2 refills | Status: DC | PRN

## 2021-11-28 NOTE — Progress Notes (Signed)
Okay to proceed with treatment without CMP per Cassie PA ?

## 2021-11-28 NOTE — Patient Instructions (Signed)
[facility]  Discharge Instructions: ?Thank you for choosing [facility] to provide your oncology and hematology care.  ? ?If you have a lab appointment with the [facility], please go directly to the [facility] and check in at the registration area. ?  ?Wear comfortable clothing and clothing appropriate for easy access to any Portacath or PICC line.  ? ?We strive to give you quality time with your provider. You may need to reschedule your appointment if you arrive late (15 or more minutes).  Arriving late affects you and other patients whose appointments are after yours.  Also, if you miss three or more appointments without notifying the office, you may be dismissed from the clinic at the provider?s discretion.    ?  ?For prescription refill requests, have your pharmacy contact our office and allow 72 hours for refills to be completed.   ? ?Today you received the following chemotherapy and/or immunotherapy agent: Tecentriq    ?  ?To help prevent nausea and vomiting after your treatment, we encourage you to take your nausea medication as directed. ? ?BELOW ARE SYMPTOMS THAT SHOULD BE REPORTED IMMEDIATELY: ?*FEVER GREATER THAN 100.4 F (38 ?C) OR HIGHER ?*CHILLS OR SWEATING ?*NAUSEA AND VOMITING THAT IS NOT CONTROLLED WITH YOUR NAUSEA MEDICATION ?*UNUSUAL SHORTNESS OF BREATH ?*UNUSUAL BRUISING OR BLEEDING ?*URINARY PROBLEMS (pain or burning when urinating, or frequent urination) ?*BOWEL PROBLEMS (unusual diarrhea, constipation, pain near the anus) ?TENDERNESS IN MOUTH AND THROAT WITH OR WITHOUT PRESENCE OF ULCERS (sore throat, sores in mouth, or a toothache) ?UNUSUAL RASH, SWELLING OR PAIN  ?UNUSUAL VAGINAL DISCHARGE OR ITCHING  ? ?Items with * indicate a potential emergency and should be followed up as soon as possible or go to the Emergency Department if any problems should occur. ? ?Please show the CHEMOTHERAPY ALERT CARD or IMMUNOTHERAPY ALERT CARD at check-in to  the Emergency Department and triage nurse. ? ?Should you have questions after your visit or need to cancel or reschedule your appointment, please contact [facility]  Dept: [phone]  and follow the prompts.  Office hours are 8:00 a.m. to 4:30 p.m. Monday - Friday. Please note that voicemails left after 4:00 p.m. may not be returned until the following business day.  We are closed weekends and major holidays. You have access to a nurse at all times for urgent questions. Please call the main number to the clinic Dept: [phone] and follow the prompts. ? ? ?For any non-urgent questions, you may also contact your provider using MyChart. We now offer e-Visits for anyone 42 and older to request care online for non-urgent symptoms. For details visit [email or web address]. ?  ?Also download the MyChart app! Go to the app store, search "MyChart", open the app, select [facility], and log in with your MyChart username and password. ? ?Due to Covid, a mask is required upon entering the hospital/clinic. If you do not have a mask, one will be given to you upon arrival. For doctor visits, patients may have 1 support person aged 65 or older with them. For treatment visits, patients cannot have anyone with them due to current Covid guidelines and our immunocompromised population.  ? ?

## 2021-11-30 ENCOUNTER — Encounter: Payer: Self-pay | Admitting: Podiatry

## 2021-11-30 ENCOUNTER — Ambulatory Visit (INDEPENDENT_AMBULATORY_CARE_PROVIDER_SITE_OTHER): Payer: Medicare Other | Admitting: Podiatry

## 2021-11-30 DIAGNOSIS — B351 Tinea unguium: Secondary | ICD-10-CM

## 2021-11-30 DIAGNOSIS — M79675 Pain in left toe(s): Secondary | ICD-10-CM | POA: Diagnosis not present

## 2021-11-30 DIAGNOSIS — M79674 Pain in right toe(s): Secondary | ICD-10-CM

## 2021-11-30 NOTE — Progress Notes (Signed)
Subjective:  ? ?Patient ID: Debra Kidd, female   DOB: 61 y.o.   MRN: [id number]  ? ?HPI ?Patient presents with elongated nailbeds that she cannot take care of and she is on chemotherapy ? ? ?ROS ? ? ?   ?Objective:  ?Physical Exam  ?Neurovascular status intact with patient found to have severely thickened dystrophic nailbeds 1-5 both feet painful ? ?   ?Assessment:  ?Chronic mycotic nail infection with pain 1-5 both feet poor health ? ?   ?Plan:  ?Debridement nailbeds 1-5 both feet no iatrogenic bleeding reappoint routine care ?   ? ? ?

## 2021-12-19 ENCOUNTER — Inpatient Hospital Stay (HOSPITAL_BASED_OUTPATIENT_CLINIC_OR_DEPARTMENT_OTHER): Payer: Medicare Other | Admitting: Internal Medicine

## 2021-12-19 ENCOUNTER — Other Ambulatory Visit: Payer: Self-pay | Admitting: Internal Medicine

## 2021-12-19 ENCOUNTER — Inpatient Hospital Stay: Payer: Medicare Other | Admitting: Dietician

## 2021-12-19 ENCOUNTER — Other Ambulatory Visit: Payer: Self-pay

## 2021-12-19 ENCOUNTER — Inpatient Hospital Stay: Payer: Medicare Other

## 2021-12-19 ENCOUNTER — Inpatient Hospital Stay: Payer: Medicare Other | Attending: Internal Medicine

## 2021-12-19 VITALS — BP 95/80 | HR 92 | Temp 98.5°F | Resp 17 | Ht 63.0 in | Wt 104.9 lb

## 2021-12-19 DIAGNOSIS — R5383 Other fatigue: Secondary | ICD-10-CM

## 2021-12-19 DIAGNOSIS — I10 Essential (primary) hypertension: Secondary | ICD-10-CM | POA: Diagnosis not present

## 2021-12-19 DIAGNOSIS — Z8673 Personal history of transient ischemic attack (TIA), and cerebral infarction without residual deficits: Secondary | ICD-10-CM | POA: Insufficient documentation

## 2021-12-19 DIAGNOSIS — E785 Hyperlipidemia, unspecified: Secondary | ICD-10-CM | POA: Diagnosis not present

## 2021-12-19 DIAGNOSIS — Z9221 Personal history of antineoplastic chemotherapy: Secondary | ICD-10-CM | POA: Insufficient documentation

## 2021-12-19 DIAGNOSIS — C3491 Malignant neoplasm of unspecified part of right bronchus or lung: Secondary | ICD-10-CM

## 2021-12-19 DIAGNOSIS — R531 Weakness: Secondary | ICD-10-CM | POA: Diagnosis not present

## 2021-12-19 DIAGNOSIS — C3411 Malignant neoplasm of upper lobe, right bronchus or lung: Secondary | ICD-10-CM | POA: Diagnosis not present

## 2021-12-19 DIAGNOSIS — Z5112 Encounter for antineoplastic immunotherapy: Secondary | ICD-10-CM

## 2021-12-19 DIAGNOSIS — Z923 Personal history of irradiation: Secondary | ICD-10-CM | POA: Diagnosis not present

## 2021-12-19 DIAGNOSIS — G894 Chronic pain syndrome: Secondary | ICD-10-CM | POA: Diagnosis not present

## 2021-12-19 DIAGNOSIS — E876 Hypokalemia: Secondary | ICD-10-CM

## 2021-12-19 DIAGNOSIS — M810 Age-related osteoporosis without current pathological fracture: Secondary | ICD-10-CM | POA: Diagnosis not present

## 2021-12-19 LAB — CBC WITH DIFFERENTIAL (CANCER CENTER ONLY)
Abs Immature Granulocytes: 0.01 10*3/uL (ref 0.00–0.07)
Basophils Absolute: 0 10*3/uL (ref 0.0–0.1)
Basophils Relative: 0 %
Eosinophils Absolute: 0.1 10*3/uL (ref 0.0–0.5)
Eosinophils Relative: 2 %
HCT: 35.2 % — ABNORMAL LOW (ref 36.0–46.0)
Hemoglobin: 11.9 g/dL — ABNORMAL LOW (ref 12.0–15.0)
Immature Granulocytes: 0 %
Lymphocytes Relative: 22 %
Lymphs Abs: 1.8 10*3/uL (ref 0.7–4.0)
MCH: 35.5 pg — ABNORMAL HIGH (ref 26.0–34.0)
MCHC: 33.8 g/dL (ref 30.0–36.0)
MCV: 105.1 fL — ABNORMAL HIGH (ref 80.0–100.0)
Monocytes Absolute: 0.4 10*3/uL (ref 0.1–1.0)
Monocytes Relative: 5 %
Neutro Abs: 5.6 10*3/uL (ref 1.7–7.7)
Neutrophils Relative %: 71 %
Platelet Count: 269 10*3/uL (ref 150–400)
RBC: 3.35 MIL/uL — ABNORMAL LOW (ref 3.87–5.11)
RDW: 13.9 % (ref 11.5–15.5)
WBC Count: 7.9 10*3/uL (ref 4.0–10.5)
nRBC: 0 % (ref 0.0–0.2)

## 2021-12-19 LAB — CMP (CANCER CENTER ONLY)
ALT: 18 U/L (ref 0–44)
AST: 34 U/L (ref 15–41)
Albumin: 3.2 g/dL — ABNORMAL LOW (ref 3.5–5.0)
Alkaline Phosphatase: 157 U/L — ABNORMAL HIGH (ref 38–126)
Anion gap: 8 (ref 5–15)
BUN: <5 mg/dL — ABNORMAL LOW (ref 8–23)
CO2: 29 mmol/L (ref 22–32)
Calcium: 8.3 mg/dL — ABNORMAL LOW (ref 8.9–10.3)
Chloride: 103 mmol/L (ref 98–111)
Creatinine: 0.71 mg/dL (ref 0.44–1.00)
GFR, Estimated: >60 mL/min (ref >60)
Glucose, Bld: 129 mg/dL — ABNORMAL HIGH (ref 70–99)
Potassium: 2.9 mmol/L — ABNORMAL LOW (ref 3.5–5.1)
Sodium: 140 mmol/L (ref 135–145)
Total Bilirubin: 0.4 mg/dL (ref 0.3–1.2)
Total Protein: 6.2 g/dL — ABNORMAL LOW (ref 6.5–8.1)

## 2021-12-19 LAB — TSH: TSH: 1.17 u[IU]/mL (ref 0.350–4.500)

## 2021-12-19 MED ORDER — SODIUM CHLORIDE 0.9% FLUSH
10.0000 mL | INTRAVENOUS | Status: DC | PRN
  Administered 2021-12-19: 10 mL

## 2021-12-19 MED ORDER — HEPARIN SOD (PORK) LOCK FLUSH 100 UNIT/ML IV SOLN
500.0000 [IU] | Freq: Once | INTRAVENOUS | Status: AC | PRN
  Administered 2021-12-19: 500 [IU]

## 2021-12-19 MED ORDER — SODIUM CHLORIDE 0.9 % IV SOLN
1200.0000 mg | Freq: Once | INTRAVENOUS | Status: AC
  Administered 2021-12-19: 1200 mg via INTRAVENOUS
  Filled 2021-12-19: qty 20

## 2021-12-19 MED ORDER — POTASSIUM CHLORIDE 20 MEQ/15ML (10%) PO SOLN: 20.0000 meq | Freq: Every day | ORAL | 0 refills | Status: DC

## 2021-12-19 MED ORDER — SODIUM CHLORIDE 0.9 % IV SOLN: Freq: Once | INTRAVENOUS | Status: AC

## 2021-12-19 NOTE — Patient Instructions (Signed)
[facility]  Discharge Instructions: ?Thank you for choosing [facility] to provide your oncology and hematology care.  ? ?If you have a lab appointment with the [facility], please go directly to the [facility] and check in at the registration area. ?  ?Wear comfortable clothing and clothing appropriate for easy access to any Portacath or PICC line.  ? ?We strive to give you quality time with your provider. You may need to reschedule your appointment if you arrive late (15 or more minutes).  Arriving late affects you and other patients whose appointments are after yours.  Also, if you miss three or more appointments without notifying the office, you may be dismissed from the clinic at the provider?s discretion.    ?  ?For prescription refill requests, have your pharmacy contact our office and allow 72 hours for refills to be completed.   ? ?Today you received the following chemotherapy and/or immunotherapy agents: atezolizumab    ?  ?To help prevent nausea and vomiting after your treatment, we encourage you to take your nausea medication as directed. ? ?BELOW ARE SYMPTOMS THAT SHOULD BE REPORTED IMMEDIATELY: ?*FEVER GREATER THAN 100.4 F (38 ?C) OR HIGHER ?*CHILLS OR SWEATING ?*NAUSEA AND VOMITING THAT IS NOT CONTROLLED WITH YOUR NAUSEA MEDICATION ?*UNUSUAL SHORTNESS OF BREATH ?*UNUSUAL BRUISING OR BLEEDING ?*URINARY PROBLEMS (pain or burning when urinating, or frequent urination) ?*BOWEL PROBLEMS (unusual diarrhea, constipation, pain near the anus) ?TENDERNESS IN MOUTH AND THROAT WITH OR WITHOUT PRESENCE OF ULCERS (sore throat, sores in mouth, or a toothache) ?UNUSUAL RASH, SWELLING OR PAIN  ?UNUSUAL VAGINAL DISCHARGE OR ITCHING  ? ?Items with * indicate a potential emergency and should be followed up as soon as possible or go to the Emergency Department if any problems should occur. ? ?Please show the CHEMOTHERAPY ALERT CARD or IMMUNOTHERAPY ALERT CARD at check-in  to the Emergency Department and triage nurse. ? ?Should you have questions after your visit or need to cancel or reschedule your appointment, please contact [facility]  Dept: [phone]  and follow the prompts.  Office hours are 8:00 a.m. to 4:30 p.m. Monday - Friday. Please note that voicemails left after 4:00 p.m. may not be returned until the following business day.  We are closed weekends and major holidays. You have access to a nurse at all times for urgent questions. Please call the main number to the clinic Dept: [phone] and follow the prompts. ? ? ?For any non-urgent questions, you may also contact your provider using MyChart. We now offer e-Visits for anyone 9 and older to request care online for non-urgent symptoms. For details visit [email or web address]. ?  ?Also download the MyChart app! Go to the app store, search "MyChart", open the app, select [facility], and log in with your MyChart username and password. ? ?Due to Covid, a mask is required upon entering the hospital/clinic. If you do not have a mask, one will be given to you upon arrival. For doctor visits, patients may have 1 support person aged 61 or older with them. For treatment visits, patients cannot have anyone with them due to current Covid guidelines and our immunocompromised population.  ? ?

## 2021-12-19 NOTE — Progress Notes (Signed)
Nutrition Follow-up: ? ?Patient with extensive stage small cell lung cancer. She is currently receiving maintenance treatment with single agent Tecentriq 1200 mg IV every 3 weeks. Status post 54 cycles. ? ?Met with patient during infusion. She is adding crackers to a bowl of tomato soup at visit. Patient reports things have not been going good. She feels weak in her legs. She continues to have diminished taste of foods. She is currently not drinking Ensure because they are expensive. Patient denies nausea, vomiting, constipation. She reports having few episodes have diarrhea.  ? ?Medications: potassium chloride 20 mEq/15 ml solution ? ?Labs: K 2.9 ? ?Anthropometrics: Weight 104 lb 14.4 oz today decreased 3.7% in the last 3 weeks; significant ? ?3/29 - 108 lb 3.2 oz ?3/8 - 107 lb 9.6 oz ?2/23 - 107 lb 6.4 oz ?1/25 - 111 lb  ? ? ?NUTRITION DIAGNOSIS: Unintended weight loss ongoing ? ? ?INTERVENTION:  ?Reviewed strategies for altered taste - pt has handout ?One complimentary case of Ensure Plus High Protein provided, pt encouraged to drink 2/day ?One food bag from [facility] pantry provided today  ?Patient will double dose of oral potassium solution x 10 days per MD ?  ? ?MONITORING, EVALUATION, GOAL: weight trends, intake ? ? ?NEXT VISIT: Wednesday May 31 during infusion  ? ? ? ?

## 2021-12-19 NOTE — Progress Notes (Signed)
Pt potassium 2.9, pt reports some diarrhea and reports she does not have much potassium left at home. MD notified, called in refill and stated for pt to double her dose for the next 10 days. Pt aware and agrees.  ?

## 2021-12-19 NOTE — Progress Notes (Signed)
?    [facility] ?[phone]   [phone] ? ?OFFICE PROGRESS NOTE ? ?[name], MD ?[address] ?[place] [address] ? ?DIAGNOSIS: Extensive stage (T2a, N3, [name], large right anterior mediastinal and supraclavicular lymphadenopathy as well as pancreatic and splenic metastasis diagnosed in September 2017.  The patient had disease progression in October 2019. ? ?PRIOR THERAPY:  ?1) Systemic chemotherapy was carboplatin for AUC of 5 on day 1 and etoposide 100 MG/M2 on days 1, 2 and 3 with Neulasta support. Status post 6 cycles with significant response of her disease. ?2) Prophylactic cranial irradiation under the care of Dr. Sondra Come on 12/02/2016. ?3) stereotactic radiotherapy to the recurrent right upper lobe pulmonary nodule under the care of Dr. Sondra Come completed November 10, 2017. ?4) Retreatment with systemic chemotherapy with carboplatin for AUC of 5 on day 1 and etoposide 100 mg/M2 on days 1, 2 and 3 as well as Tecentriq (Atezolizumab) 1200 mg IV every 3 weeks with Neulasta support.  First dose June 22, 2018 for disease recurrence.  Status post 5 cycles.  Starting from cycle #2 her dose of carboplatin will be reduced to AUC of 4 and etoposide 80 mg/M2 on days 1, 2 and 3 in addition to the regular dose of Tecentriq. ? ?CURRENT THERAPY: Maintenance treatment with single agent Tecentriq 1200 mg IV every 3 weeks.  Status post 54 cycles. ? ?INTERVAL HISTORY: ?Debra Kidd 61 y.o. female returns to the clinic today for follow-up visit.  The patient is feeling fine today with no concerning complaints except for the weakness in the lower extremities.  She does not exercise at regular basis.  She denied having any chest pain but has shortness of breath with exertion with no cough or hemoptysis.  She has no nausea, vomiting, diarrhea or constipation.  She has no headache or visual changes.  She continues to have  some itching and she will apply hydrocortisone cream on as-needed basis.  She does not take Atarax anymore because she make her drowsy.  The patient is here today for evaluation before starting cycle #55 of her maintenance therapy. ? ?MEDICAL HISTORY: ?Past Medical History:  ?Diagnosis Date  ? Basal cell carcinoma of cheek   ? L side of face  ? Blood type, Rh positive   ? Cancer [facility])   ? Chronic pain syndrome   ? Depression 08/07/2016  ? Encounter for antineoplastic chemotherapy 07/17/2016  ? History of external beam radiation therapy 11/19/16-12/02/16  ? brain 25 Gy in 10 fractions  ? Hyperlipidemia   ? Hypertension   ? Hypertension 08/07/2016  ? Osteoporosis   ? Small cell lung cancer ([place]) dx'd 05/2016  ? ? ?ALLERGIES:  is allergic to codeine, motrin [ibuprofen], thiazide-type diuretics, and vicodin [hydrocodone-acetaminophen]. ? ?MEDICATIONS:  ?Current Outpatient Medications  ?Medication Sig Dispense Refill  ? atorvastatin (LIPITOR) 80 MG tablet Take 1 tablet (80 mg total) by mouth daily. 30 tablet 5  ? clopidogrel (PLAVIX) 75 MG tablet Take 1 tablet (75 mg total) by mouth daily. 30 tablet 0  ? diazepam (VALIUM) 5 MG tablet Take 5 mg by mouth 3 (three) times daily as needed for anxiety.   2  ? lidocaine-prilocaine (EMLA) cream Apply 1 application. topically as needed. 30 g 2  ? oxyCODONE-acetaminophen (PERCOCET) 5-325 MG tablet Take 1 tablet by mouth every 6 (six) hours as needed for severe pain. 30 tablet 0  ?  potassium chloride 20 MEQ/15ML (10%) SOLN Take 15 mLs (20 mEq total) by mouth daily. 100 mL 0  ? prochlorperazine (COMPAZINE) 10 MG tablet Take 1 tablet (10 mg total) by mouth every 6 (six) hours as needed for nausea or vomiting. 30 tablet 2  ? traMADol (ULTRAM) 50 MG tablet Take 50 mg by mouth 4 (four) times daily as needed for moderate pain (confirmed with her Pharmacy-Dr K. Little 03/15 she received #120).    ? UNABLE TO FIND Med Name: "cholesterol medication"    ? ?No current facility-administered  medications for this visit.  ? ?Facility-Administered Medications Ordered in Other Visits  ?Medication Dose Route Frequency Provider Last Rate Last Admin  ? sodium chloride flush (NS) 0.9 % injection 10 mL  10 mL Intracatheter PRN [name], MD   10 mL at 12/19/21 1048  ? ? ?SURGICAL HISTORY:  ?Past Surgical History:  ?Procedure Laterality Date  ? ABDOMINAL HYSTERECTOMY  1981  ? APPENDECTOMY    ? at age 15  ? EYE SURGERY    ? left  ? IR GENERIC HISTORICAL  06/03/2016  ? IR FLUORO GUIDE PORT INSERTION RIGHT 06/03/2016 [facility]  ? IR GENERIC HISTORICAL  06/03/2016  ? IR US GUIDE VASC ACCESS RIGHT 06/03/2016 [facility]  ? SKIN CANCER EXCISION    ? basal cell carcinoma L side of face  ? ? ?REVIEW OF SYSTEMS:  A comprehensive review of systems was negative except for: Constitutional: positive for fatigue ?Integument/breast: positive for dryness and pruritus ?Musculoskeletal: positive for muscle weakness  ? ?PHYSICAL EXAMINATION: General appearance: alert, cooperative, fatigued, and no distress ?Head: Normocephalic, without obvious abnormality, atraumatic ?Neck: no adenopathy, no JVD, supple, symmetrical, trachea midline, and thyroid not enlarged, symmetric, no tenderness/mass/nodules ?Lymph nodes: Cervical, supraclavicular, and axillary nodes normal. ?Resp: clear to auscultation bilaterally ?Back: symmetric, no curvature. ROM normal. No CVA tenderness. ?Cardio: regular rate and rhythm, S1, S2 normal, no murmur, click, rub or gallop ?GI: soft, non-tender; bowel sounds normal; no masses,  no organomegaly ?Extremities: extremities normal, atraumatic, no cyanosis or edema ? ?ECOG PERFORMANCE STATUS: 1 - Symptomatic but completely ambulatory ? ?Blood pressure 95/80, pulse 92, temperature 98.5 ?F (36.9 ?C), temperature source Oral, resp. rate 17, height 5\' 3"  (1.6 m), weight 104 lb 14.4 oz (47.6 kg), SpO2 100 %. ? ?LABORATORY DATA: ?Lab Results  ?Component Value Date  ? WBC 9.4 11/28/2021  ? HGB 11.4 (L) 11/28/2021   ? HCT 34.0 (L) 11/28/2021  ? MCV 104.3 (H) 11/28/2021  ? PLT 334 11/28/2021  ? ? ?  Chemistry   ?   ?Component Value Date/Time  ? NA 138 11/28/2021 1406  ? NA 141 08/01/2017 0835  ? K 3.4 (L) 11/28/2021 1406  ? K 3.5 08/01/2017 0835  ? CL 103 11/28/2021 1406  ? CO2 29 11/28/2021 1406  ? CO2 25 08/01/2017 0835  ? BUN 5 (L) 11/28/2021 1406  ? BUN 8.0 08/01/2017 0835  ? CREATININE 0.75 11/28/2021 1406  ? CREATININE 0.9 08/01/2017 0835  ?    ?Component Value Date/Time  ? CALCIUM 8.5 (L) 11/28/2021 1406  ? CALCIUM 9.9 08/01/2017 0835  ? ALKPHOS 134 (H) 11/28/2021 1406  ? ALKPHOS 142 08/01/2017 0835  ? AST 29 11/28/2021 1406  ? AST 15 08/01/2017 0835  ? ALT 16 11/28/2021 1406  ? ALT 19 08/01/2017 0835  ? BILITOT 0.2 (L) 11/28/2021 1406  ? BILITOT 0.35 08/01/2017 0835  ?  ? ? ? ?RADIOGRAPHIC STUDIES: ?No results found. ? ?ASSESSMENT  AND PLAN:  ?This is a very pleasant 61 years old white female with extensive stage small cell lung cancer status post systemic chemotherapy with carbo platinum and etoposide for 6 cycles and the patient rotated her treatment well except for chemotherapy-induced anemia and requirement for PRBCs and platelet transfusion. She had significant improvement in her disease with the chemotherapy. ?She also had prophylactic cranial irradiation. ?She also underwent stereotactic radiotherapy to progressive right upper lobe pulmonary nodule. ?The patient has been in observation for close to 2 years. ?Repeat CT scan of the chest, abdomen and pelvis performed recently showed evidence for disease progression in the abdomen. ?The patient started on systemic chemotherapy again with carboplatin, etoposide and Tecentriq status post 59 cycles.  Starting from cycle #5 the patient is treated with maintenance single agent Tecentriq. ?The patient has been tolerating her treatment with Tecentriq fairly well except for the pruritus.  She is applying hydrocortisone cream to the affected area. ?I recommended for her to  proceed with cycle #60 today as planned. ?I will see her back for follow-up visit in 3 weeks for evaluation before the next cycle of her treatment. ?For the history of stroke, she is followed by neurology and she

## 2022-01-05 NOTE — Progress Notes (Signed)
[facility] ?OFFICE PROGRESS NOTE ? ?[name], MD ?[address] ?[place] [address] ? ?DIAGNOSIS:  Extensive stage (T2a, N3, [name], large right anterior mediastinal and supraclavicular lymphadenopathy as well as pancreatic and splenic metastasis diagnosed in September 2017.  The patient had disease progression in October 2019. ? ?PRIOR THERAPY: ?1) Systemic chemotherapy was carboplatin for AUC of 5 on day 1 and etoposide 100 MG/M2 on days 1, 2 and 3 with Neulasta support. Status post 6 cycles with significant response of her disease. ?2) Prophylactic cranial irradiation under the care of Dr. Sondra Come on 12/02/2016. ?3) stereotactic radiotherapy to the recurrent right upper lobe pulmonary nodule under the care of Dr. Sondra Come completed November 10, 2017. ?4) Retreatment with systemic chemotherapy with carboplatin for AUC of 5 on day 1 and etoposide 100 mg/M2 on days 1, 2 and 3 as well as Tecentriq (Atezolizumab) 1200 mg IV every 3 weeks with Neulasta support.  First dose June 22, 2018 for disease recurrence.  Status post 5 cycles.  Starting from cycle #2 her dose of carboplatin will be reduced to AUC of 4 and etoposide 80 mg/M2 on days 1, 2 and 3 in addition to the regular dose of Tecentriq. ? ?CURRENT THERAPY: Maintenance treatment with single agent Tecentriq 1200 mg IV every 3 weeks.  Status post 60 cycles. ? ?INTERVAL HISTORY: ?Debra Kidd 61 y.o. female returns to the clinic today for a follow-up visit.  The patient is feeling fairly well today without any concerning complaints except for persistent fatigue and generalized weakness. She has been taking tramadol and benadryl daily. She takes tramadol for chronic back pain. She does have a mild chronic compression fracture in her thoracic spine. She is very deconditioned and is not active at home. She has chronic left sided weakness due to hx of stroke and multiple vascular issues.  She is followed by neurology presently. She does not have a PCP although I gave her suggested practices at a prior appointment last month. She has run out of her lipior and tramadol. She is requesting that I refill these until she can establish with another PCP.  ? ?She has a history of low potassium for which she has been taking potassium supplements for PRN.  ? ?The patient is currently undergoing immunotherapy with Tecentriq and she is tolerating it well except for some occasional dry skin and itching.  She was previously prescribed Atarax but it has been making her drowsy and dizzy more recently and she has not been using it. She uses benadryl if needed instead, which also helps her allergies. She states benadryl does not make her drowsy. She has had a decreased appetite and weight loss recently and has been followed by member the nutritionist team. They evaluated her at her last appointment as well and she was given complementary ensures. She was given information for the food pantry. She states she has been using V8 and carnation instant breakfast. She notes that she has a smell aversion to certain foods. The patient does not hydrate well at home and does not drink a lot of fluid. Denies any recent fever or night sweats.  Denies any new nausea, vomiting, or constipation.  She sometimes has diarrhea if she takes her potassium, reportedly, but nothing unusual. She does not take imodium. She reports her baseline dyspnea on exertion and denies any chest pain or hemoptysis.  She has a baseline mild cough.  She continues to smoke 1.5 to 2 packs of cigarettes per day.  She is here today for evaluation and repeat blood work before starting cycle #61. ? ?MEDICAL HISTORY: ?Past Medical History:  ?Diagnosis Date  ? Basal cell carcinoma of cheek   ? L side of face  ? Blood type, Rh positive   ? Cancer [facility])   ? Chronic pain syndrome   ? Depression 08/07/2016  ? Encounter for antineoplastic chemotherapy 07/17/2016  ?  History of external beam radiation therapy 11/19/16-12/02/16  ? brain 25 Gy in 10 fractions  ? Hyperlipidemia   ? Hypertension   ? Hypertension 08/07/2016  ? Osteoporosis   ? Small cell lung cancer ([place]) dx'd 05/2016  ? ? ?ALLERGIES:  is allergic to codeine, motrin [ibuprofen], thiazide-type diuretics, and vicodin [hydrocodone-acetaminophen]. ? ?MEDICATIONS:  ?Current Outpatient Medications  ?Medication Sig Dispense Refill  ? atorvastatin (LIPITOR) 80 MG tablet Take 1 tablet (80 mg total) by mouth daily. 30 tablet 5  ? clopidogrel (PLAVIX) 75 MG tablet Take 1 tablet (75 mg total) by mouth daily. 30 tablet 0  ? diazepam (VALIUM) 5 MG tablet Take 5 mg by mouth 3 (three) times daily as needed for anxiety.   2  ? lidocaine-prilocaine (EMLA) cream Apply 1 application. topically as needed. 30 g 2  ? potassium chloride 20 MEQ/15ML (10%) SOLN Take 15 mLs (20 mEq total) by mouth daily. 100 mL 0  ? prochlorperazine (COMPAZINE) 10 MG tablet Take 1 tablet (10 mg total) by mouth every 6 (six) hours as needed for nausea or vomiting. 30 tablet 2  ? traMADol (ULTRAM) 50 MG tablet Take 1 tablet (50 mg total) by mouth 4 (four) times daily as needed for moderate pain (confirmed with her Pharmacy-Dr K. Little 03/15 she received #120). 30 tablet 0  ? UNABLE TO FIND Med Name: "cholesterol medication"    ? ?No current facility-administered medications for this visit.  ? ?Facility-Administered Medications Ordered in Other Visits  ?Medication Dose Route Frequency Provider Last Rate Last Admin  ? atezolizumab (TECENTRIQ) 1,200 mg in sodium chloride 0.9 % 250 mL chemo infusion  1,200 mg Intravenous Once [name], MD 540 mL/hr at 01/09/22 1239 1,200 mg at 01/09/22 1239  ? heparin lock flush 100 unit/mL  500 Units Intracatheter Once PRN [name], MD      ? sodium chloride flush (NS) 0.9 % injection 10 mL  10 mL Intracatheter PRN [name], MD      ? ? ?SURGICAL HISTORY:  ?Past Surgical History:  ?Procedure Laterality Date  ?  ABDOMINAL HYSTERECTOMY  1981  ? APPENDECTOMY    ? at age 61  ? EYE SURGERY    ? left  ? IR GENERIC HISTORICAL  06/03/2016  ? IR FLUORO GUIDE PORT INSERTION RIGHT 06/03/2016 [facility]  ? IR GENERIC HISTORICAL  06/03/2016  ? IR US GUIDE VASC ACCESS RIGHT 06/03/2016 [facility]  ? SKIN CANCER EXCISION    ? basal cell carcinoma L side of face  ? ? ?REVIEW OF SYSTEMS:   ?Review of Systems  ?Constitutional: Positive for decreased appetite and fatigue. Negative for  chills, fever and unexpected weight change.  ?HENT:   Negative for mouth sores, nosebleeds, sore throat and trouble swallowing.   ?Eyes: Negative for eye problems and icterus.  ?Respiratory: Positive for SOB and mild cough which are her baseline. Negative for hemoptysis, and wheezing.   ?Cardiovascular: Negative for chest pain and leg swelling.  ?Gastrointestinal: Negative for abdominal pain, constipation, diarrhea,  nausea and vomiting.  ?Genitourinary: Negative for bladder incontinence, difficulty urinating, dysuria, frequency and hematuria.   ?Musculoskeletal: Negative for back pain, gait problem, neck pain and neck stiffness.  ?Skin: Negative for itching and rash.  ?Neurological: Negative for dizziness, extremity weakness, gait problem, headaches, light-headedness and seizures.  ?Hematological: Negative for adenopathy. Does not bruise/bleed easily.  ?Psychiatric/Behavioral: Negative for confusion, depression and sleep disturbance. The patient is not nervous/anxious.   ? ? ?PHYSICAL EXAMINATION:  ?Blood pressure 92/69, pulse 83, temperature (!) 97.3 ?F (36.3 ?C), temperature source Temporal, resp. rate 17, height 5\' 3"  (1.6 m), weight 104 lb 8 oz (47.4 kg), SpO2 99 %. ? ?ECOG PERFORMANCE STATUS: 2 ? ?Physical Exam  ?Constitutional: Oriented to person, place, and time and chronically ill appearing female appears older than stated age and in no distress. ?HENT: ?Head: Normocephalic and atraumatic. ?Mouth/Throat: Oropharynx is clear and moist. No oropharyngeal  exudate. ?Eyes: Conjunctivae are normal. Right eye exhibits no discharge. Left eye exhibits no discharge. No scleral icterus. ?Neck: Normal range of motion. Neck supple. ?Cardiovascular: Normal rate, regular

## 2022-01-09 ENCOUNTER — Other Ambulatory Visit: Payer: Self-pay

## 2022-01-09 ENCOUNTER — Inpatient Hospital Stay: Payer: Medicare Other | Attending: Internal Medicine

## 2022-01-09 ENCOUNTER — Inpatient Hospital Stay: Payer: Medicare Other

## 2022-01-09 ENCOUNTER — Inpatient Hospital Stay (HOSPITAL_BASED_OUTPATIENT_CLINIC_OR_DEPARTMENT_OTHER): Payer: Medicare Other | Admitting: Physician Assistant

## 2022-01-09 VITALS — BP 92/69 | HR 83 | Temp 97.3°F | Resp 17 | Ht 63.0 in | Wt 104.5 lb

## 2022-01-09 DIAGNOSIS — R059 Cough, unspecified: Secondary | ICD-10-CM | POA: Insufficient documentation

## 2022-01-09 DIAGNOSIS — Z5112 Encounter for antineoplastic immunotherapy: Secondary | ICD-10-CM | POA: Insufficient documentation

## 2022-01-09 DIAGNOSIS — M8008XA Age-related osteoporosis with current pathological fracture, vertebra(e), initial encounter for fracture: Secondary | ICD-10-CM | POA: Diagnosis not present

## 2022-01-09 DIAGNOSIS — Z8673 Personal history of transient ischemic attack (TIA), and cerebral infarction without residual deficits: Secondary | ICD-10-CM | POA: Insufficient documentation

## 2022-01-09 DIAGNOSIS — Z79899 Other long term (current) drug therapy: Secondary | ICD-10-CM | POA: Diagnosis not present

## 2022-01-09 DIAGNOSIS — G894 Chronic pain syndrome: Secondary | ICD-10-CM | POA: Diagnosis not present

## 2022-01-09 DIAGNOSIS — Z85828 Personal history of other malignant neoplasm of skin: Secondary | ICD-10-CM | POA: Insufficient documentation

## 2022-01-09 DIAGNOSIS — R5383 Other fatigue: Secondary | ICD-10-CM

## 2022-01-09 DIAGNOSIS — I10 Essential (primary) hypertension: Secondary | ICD-10-CM | POA: Insufficient documentation

## 2022-01-09 DIAGNOSIS — E876 Hypokalemia: Secondary | ICD-10-CM

## 2022-01-09 DIAGNOSIS — R197 Diarrhea, unspecified: Secondary | ICD-10-CM | POA: Diagnosis not present

## 2022-01-09 DIAGNOSIS — E785 Hyperlipidemia, unspecified: Secondary | ICD-10-CM | POA: Insufficient documentation

## 2022-01-09 DIAGNOSIS — C7889 Secondary malignant neoplasm of other digestive organs: Secondary | ICD-10-CM | POA: Diagnosis not present

## 2022-01-09 DIAGNOSIS — F17210 Nicotine dependence, cigarettes, uncomplicated: Secondary | ICD-10-CM | POA: Diagnosis not present

## 2022-01-09 DIAGNOSIS — C3491 Malignant neoplasm of unspecified part of right bronchus or lung: Secondary | ICD-10-CM | POA: Diagnosis not present

## 2022-01-09 DIAGNOSIS — C3411 Malignant neoplasm of upper lobe, right bronchus or lung: Secondary | ICD-10-CM | POA: Diagnosis not present

## 2022-01-09 DIAGNOSIS — I6522 Occlusion and stenosis of left carotid artery: Secondary | ICD-10-CM | POA: Diagnosis not present

## 2022-01-09 LAB — CMP (CANCER CENTER ONLY)
ALT: 16 U/L (ref 0–44)
AST: 28 U/L (ref 15–41)
Albumin: 3.1 g/dL — ABNORMAL LOW (ref 3.5–5.0)
Alkaline Phosphatase: 135 U/L — ABNORMAL HIGH (ref 38–126)
Anion gap: 5 (ref 5–15)
BUN: <5 mg/dL — ABNORMAL LOW (ref 8–23)
CO2: 29 mmol/L (ref 22–32)
Calcium: 8.4 mg/dL — ABNORMAL LOW (ref 8.9–10.3)
Chloride: 104 mmol/L (ref 98–111)
Creatinine: 0.65 mg/dL (ref 0.44–1.00)
GFR, Estimated: >60 mL/min (ref >60)
Glucose, Bld: 110 mg/dL — ABNORMAL HIGH (ref 70–99)
Potassium: 3.2 mmol/L — ABNORMAL LOW (ref 3.5–5.1)
Sodium: 138 mmol/L (ref 135–145)
Total Bilirubin: 0.4 mg/dL (ref 0.3–1.2)
Total Protein: 6.1 g/dL — ABNORMAL LOW (ref 6.5–8.1)

## 2022-01-09 LAB — CBC WITH DIFFERENTIAL (CANCER CENTER ONLY)
Abs Immature Granulocytes: 0.02 10*3/uL (ref 0.00–0.07)
Basophils Absolute: 0 10*3/uL (ref 0.0–0.1)
Basophils Relative: 1 %
Eosinophils Absolute: 0.2 10*3/uL (ref 0.0–0.5)
Eosinophils Relative: 3 %
HCT: 34.1 % — ABNORMAL LOW (ref 36.0–46.0)
Hemoglobin: 11.4 g/dL — ABNORMAL LOW (ref 12.0–15.0)
Immature Granulocytes: 0 %
Lymphocytes Relative: 27 %
Lymphs Abs: 2.2 10*3/uL (ref 0.7–4.0)
MCH: 36 pg — ABNORMAL HIGH (ref 26.0–34.0)
MCHC: 33.4 g/dL (ref 30.0–36.0)
MCV: 107.6 fL — ABNORMAL HIGH (ref 80.0–100.0)
Monocytes Absolute: 0.5 10*3/uL (ref 0.1–1.0)
Monocytes Relative: 6 %
Neutro Abs: 5.1 10*3/uL (ref 1.7–7.7)
Neutrophils Relative %: 63 %
Platelet Count: 284 10*3/uL (ref 150–400)
RBC: 3.17 MIL/uL — ABNORMAL LOW (ref 3.87–5.11)
RDW: 13.5 % (ref 11.5–15.5)
WBC Count: 8.1 10*3/uL (ref 4.0–10.5)
nRBC: 0 % (ref 0.0–0.2)

## 2022-01-09 LAB — TSH: TSH: 1.924 u[IU]/mL (ref 0.350–4.500)

## 2022-01-09 MED ORDER — SODIUM CHLORIDE 0.9% FLUSH
10.0000 mL | INTRAVENOUS | Status: DC | PRN
  Administered 2022-01-09: 10 mL

## 2022-01-09 MED ORDER — SODIUM CHLORIDE 0.9 % IV SOLN
1200.0000 mg | Freq: Once | INTRAVENOUS | Status: AC
  Administered 2022-01-09: 1200 mg via INTRAVENOUS
  Filled 2022-01-09: qty 20

## 2022-01-09 MED ORDER — TRAMADOL HCL 50 MG PO TABS: 50.0000 mg | ORAL_TABLET | Freq: Four times a day (QID) | ORAL | 0 refills | Status: DC | PRN

## 2022-01-09 MED ORDER — ATORVASTATIN CALCIUM 80 MG PO TABS: 80.0000 mg | ORAL_TABLET | Freq: Every day | ORAL | 5 refills | Status: DC

## 2022-01-09 MED ORDER — POTASSIUM CHLORIDE 20 MEQ/15ML (10%) PO SOLN: 20.0000 meq | Freq: Every day | ORAL | 0 refills | Status: DC

## 2022-01-09 MED ORDER — SODIUM CHLORIDE 0.9 % IV SOLN: Freq: Once | INTRAVENOUS | Status: AC

## 2022-01-09 MED ORDER — HEPARIN SOD (PORK) LOCK FLUSH 100 UNIT/ML IV SOLN
500.0000 [IU] | Freq: Once | INTRAVENOUS | Status: AC | PRN
  Administered 2022-01-09: 500 [IU]

## 2022-01-09 NOTE — Patient Instructions (Signed)
[facility]  Discharge Instructions: ?Thank you for choosing [facility] to provide your oncology and hematology care.  ? ?If you have a lab appointment with the [facility], please go directly to the [facility] and check in at the registration area. ?  ?Wear comfortable clothing and clothing appropriate for easy access to any Portacath or PICC line.  ? ?We strive to give you quality time with your provider. You may need to reschedule your appointment if you arrive late (15 or more minutes).  Arriving late affects you and other patients whose appointments are after yours.  Also, if you miss three or more appointments without notifying the office, you may be dismissed from the clinic at the provider?s discretion.    ?  ?For prescription refill requests, have your pharmacy contact our office and allow 72 hours for refills to be completed.   ? ?Today you received the following chemotherapy and/or immunotherapy agents tecentriq    ?  ?To help prevent nausea and vomiting after your treatment, we encourage you to take your nausea medication as directed. ? ?BELOW ARE SYMPTOMS THAT SHOULD BE REPORTED IMMEDIATELY: ?*FEVER GREATER THAN 100.4 F (38 ?C) OR HIGHER ?*CHILLS OR SWEATING ?*NAUSEA AND VOMITING THAT IS NOT CONTROLLED WITH YOUR NAUSEA MEDICATION ?*UNUSUAL SHORTNESS OF BREATH ?*UNUSUAL BRUISING OR BLEEDING ?*URINARY PROBLEMS (pain or burning when urinating, or frequent urination) ?*BOWEL PROBLEMS (unusual diarrhea, constipation, pain near the anus) ?TENDERNESS IN MOUTH AND THROAT WITH OR WITHOUT PRESENCE OF ULCERS (sore throat, sores in mouth, or a toothache) ?UNUSUAL RASH, SWELLING OR PAIN  ?UNUSUAL VAGINAL DISCHARGE OR ITCHING  ? ?Items with * indicate a potential emergency and should be followed up as soon as possible or go to the Emergency Department if any problems should occur. ? ?Please show the CHEMOTHERAPY ALERT CARD or IMMUNOTHERAPY ALERT CARD at check-in to  the Emergency Department and triage nurse. ? ?Should you have questions after your visit or need to cancel or reschedule your appointment, please contact [facility]  Dept: [phone]  and follow the prompts.  Office hours are 8:00 a.m. to 4:30 p.m. Monday - Friday. Please note that voicemails left after 4:00 p.m. may not be returned until the following business day.  We are closed weekends and major holidays. You have access to a nurse at all times for urgent questions. Please call the main number to the clinic Dept: [phone] and follow the prompts. ? ? ?For any non-urgent questions, you may also contact your provider using MyChart. We now offer e-Visits for anyone 64 and older to request care online for non-urgent symptoms. For details visit [email or web address]. ?  ?Also download the MyChart app! Go to the app store, search "MyChart", open the app, select [facility], and log in with your MyChart username and password. ? ?Due to Covid, a mask is required upon entering the hospital/clinic. If you do not have a mask, one will be given to you upon arrival. For doctor visits, patients may have 1 support person aged 1 or older with them. For treatment visits, patients cannot have anyone with them due to current Covid guidelines and our immunocompromised population.  ? ?

## 2022-01-24 ENCOUNTER — Ambulatory Visit (HOSPITAL_COMMUNITY)
Admission: RE | Admit: 2022-01-24 | Discharge: 2022-01-24 | Disposition: A | Discharge status: Home / Self Care | Payer: Medicare Other | Source: Ambulatory Visit | Attending: Physician Assistant | Admitting: Physician Assistant

## 2022-01-24 DIAGNOSIS — C3491 Malignant neoplasm of unspecified part of right bronchus or lung: Secondary | ICD-10-CM | POA: Diagnosis present

## 2022-01-24 MED ORDER — IOHEXOL 300 MG/ML  SOLN
100.0000 mL | Freq: Once | INTRAMUSCULAR | Status: AC | PRN
  Administered 2022-01-24: 100 mL via INTRAVENOUS

## 2022-01-24 MED ORDER — HEPARIN SOD (PORK) LOCK FLUSH 100 UNIT/ML IV SOLN
500.0000 [IU] | Freq: Once | INTRAVENOUS | Status: AC
  Administered 2022-01-24: 500 [IU] via INTRAVENOUS

## 2022-01-24 MED ORDER — HEPARIN SOD (PORK) LOCK FLUSH 100 UNIT/ML IV SOLN
INTRAVENOUS | Status: AC
  Administered 2022-01-24: 500 [IU]
  Filled 2022-01-24: qty 5

## 2022-01-24 MED ORDER — SODIUM CHLORIDE (PF) 0.9 % IJ SOLN
INTRAMUSCULAR | Status: AC
  Filled 2022-01-24: qty 50

## 2022-01-30 ENCOUNTER — Inpatient Hospital Stay (HOSPITAL_BASED_OUTPATIENT_CLINIC_OR_DEPARTMENT_OTHER): Payer: Medicare Other | Admitting: Internal Medicine

## 2022-01-30 ENCOUNTER — Other Ambulatory Visit: Payer: Self-pay

## 2022-01-30 ENCOUNTER — Inpatient Hospital Stay: Payer: Medicare Other

## 2022-01-30 ENCOUNTER — Encounter: Payer: Self-pay | Admitting: Internal Medicine

## 2022-01-30 ENCOUNTER — Inpatient Hospital Stay: Payer: Medicare Other | Admitting: Dietician

## 2022-01-30 ENCOUNTER — Other Ambulatory Visit: Payer: Self-pay | Admitting: Physician Assistant

## 2022-01-30 VITALS — BP 109/76

## 2022-01-30 VITALS — BP 80/60 | HR 88 | Temp 98.7°F | Resp 16 | Ht 63.0 in | Wt 102.7 lb

## 2022-01-30 DIAGNOSIS — C3491 Malignant neoplasm of unspecified part of right bronchus or lung: Secondary | ICD-10-CM

## 2022-01-30 DIAGNOSIS — R5383 Other fatigue: Secondary | ICD-10-CM

## 2022-01-30 DIAGNOSIS — E876 Hypokalemia: Secondary | ICD-10-CM

## 2022-01-30 DIAGNOSIS — Z5112 Encounter for antineoplastic immunotherapy: Secondary | ICD-10-CM | POA: Diagnosis not present

## 2022-01-30 LAB — CBC WITH DIFFERENTIAL (CANCER CENTER ONLY)
Abs Immature Granulocytes: 0.02 10*3/uL (ref 0.00–0.07)
Basophils Absolute: 0 10*3/uL (ref 0.0–0.1)
Basophils Relative: 0 %
Eosinophils Absolute: 0.2 10*3/uL (ref 0.0–0.5)
Eosinophils Relative: 2 %
HCT: 34.2 % — ABNORMAL LOW (ref 36.0–46.0)
Hemoglobin: 11.6 g/dL — ABNORMAL LOW (ref 12.0–15.0)
Immature Granulocytes: 0 %
Lymphocytes Relative: 25 %
Lymphs Abs: 2.1 10*3/uL (ref 0.7–4.0)
MCH: 36.3 pg — ABNORMAL HIGH (ref 26.0–34.0)
MCHC: 33.9 g/dL (ref 30.0–36.0)
MCV: 106.9 fL — ABNORMAL HIGH (ref 80.0–100.0)
Monocytes Absolute: 0.5 10*3/uL (ref 0.1–1.0)
Monocytes Relative: 6 %
Neutro Abs: 5.5 10*3/uL (ref 1.7–7.7)
Neutrophils Relative %: 67 %
Platelet Count: 281 10*3/uL (ref 150–400)
RBC: 3.2 MIL/uL — ABNORMAL LOW (ref 3.87–5.11)
RDW: 13.2 % (ref 11.5–15.5)
WBC Count: 8.2 10*3/uL (ref 4.0–10.5)
nRBC: 0 % (ref 0.0–0.2)

## 2022-01-30 LAB — CMP (CANCER CENTER ONLY)
ALT: 11 U/L (ref 0–44)
AST: 18 U/L (ref 15–41)
Albumin: 3.2 g/dL — ABNORMAL LOW (ref 3.5–5.0)
Alkaline Phosphatase: 143 U/L — ABNORMAL HIGH (ref 38–126)
Anion gap: 7 (ref 5–15)
BUN: <5 mg/dL — ABNORMAL LOW (ref 8–23)
CO2: 28 mmol/L (ref 22–32)
Calcium: 8.6 mg/dL — ABNORMAL LOW (ref 8.9–10.3)
Chloride: 104 mmol/L (ref 98–111)
Creatinine: 0.7 mg/dL (ref 0.44–1.00)
GFR, Estimated: >60 mL/min (ref >60)
Glucose, Bld: 118 mg/dL — ABNORMAL HIGH (ref 70–99)
Potassium: 2.9 mmol/L — ABNORMAL LOW (ref 3.5–5.1)
Sodium: 139 mmol/L (ref 135–145)
Total Bilirubin: 0.4 mg/dL (ref 0.3–1.2)
Total Protein: 6 g/dL — ABNORMAL LOW (ref 6.5–8.1)

## 2022-01-30 LAB — TSH: TSH: 3.9 u[IU]/mL (ref 0.350–4.500)

## 2022-01-30 MED ORDER — SODIUM CHLORIDE 0.9% FLUSH
10.0000 mL | INTRAVENOUS | Status: DC | PRN
  Administered 2022-01-30: 10 mL

## 2022-01-30 MED ORDER — SODIUM CHLORIDE 0.9 % IV SOLN: Freq: Once | INTRAVENOUS | Status: AC

## 2022-01-30 MED ORDER — SODIUM CHLORIDE 0.9 % IV SOLN
Freq: Once | INTRAVENOUS | Status: AC
  Filled 2022-01-30: qty 20

## 2022-01-30 MED ORDER — SODIUM CHLORIDE 0.9 % IV SOLN
1200.0000 mg | Freq: Once | INTRAVENOUS | Status: AC
  Administered 2022-01-30: 1200 mg via INTRAVENOUS
  Filled 2022-01-30: qty 20

## 2022-01-30 MED ORDER — HEPARIN SOD (PORK) LOCK FLUSH 100 UNIT/ML IV SOLN
500.0000 [IU] | Freq: Once | INTRAVENOUS | Status: AC | PRN
  Administered 2022-01-30: 500 [IU]

## 2022-01-30 NOTE — Progress Notes (Signed)
Nutrition Follow-up:  Patient with extensive stage small cell lung cancer. She is currently receiving maintenance treatment with single agent Tecentriq 1200 mg IV every 3 weeks. Status post 54 cycles.  Met with patient in infusion. She is tired today. Patient reports poor appetite. She is bored with eating pork, beef, and chicken. Patient relies on husband and her son to prepare meals as she is too weak to cook. Patient shares she has taught them well, they are able to fix some decent meals. Yesterday patient recalls CIB for breakfast, buttermilk, chocolate milk, [employer] with mashed potatoes, and 6 pepsi. She does not drink water. She denies nausea, vomiting, diarrhea, constipation.   Medications: reviewed   Labs: K 2.9 (IV potassium ordered), glucose 118, BUN <5  Anthropometrics: Weight 102 lb 11.2 oz today decreased   5/10 - 104 lb 8 oz  4/19 - 104 lb 14.4 oz  3/29 - 108 lb 3.2 oz 3/8 - 107 lb 9.6 oz   NUTRITION DIAGNOSIS: Unintended weight loss ongoing   INTERVENTION:  Encouraged high calorie high protein foods Offered alternate meat suggestions as pt has grown tired of eating same foods - high calorie recipes provided Increase oral nutrition supplement - 3 Ensure Plus/equivalent daily Educated on foods with potassium - handout provided One complimentary case of Ensure Plus High Protein provided One food bag from [employer] provided     MONITORING, EVALUATION, GOAL: weight trends, intake   NEXT VISIT: Wednesday June 21 during infusion

## 2022-01-30 NOTE — Progress Notes (Signed)
[place] [phone]   [phone]  OFFICE PROGRESS NOTE  Pcp, [name], N3, [name], large right anterior mediastinal and supraclavicular lymphadenopathy as well as pancreatic and splenic metastasis diagnosed in September 2017.  The patient had disease progression in October 2019.  PRIOR THERAPY:  1) Systemic chemotherapy was carboplatin for AUC of 5 on day 1 and etoposide 100 MG/M2 on days 1, 2 and 3 with Neulasta support. Status post 6 cycles with significant response of her disease. 2) Prophylactic cranial irradiation under the care of Dr. Sondra Come on 12/02/2016. 3) stereotactic radiotherapy to the recurrent right upper lobe pulmonary nodule under the care of Dr. Sondra Come completed November 10, 2017. 4) Retreatment with systemic chemotherapy with carboplatin for AUC of 5 on day 1 and etoposide 100 mg/M2 on days 1, 2 and 3 as well as Tecentriq (Atezolizumab) 1200 mg IV every 3 weeks with Neulasta support.  First dose June 22, 2018 for disease recurrence.  Status post 5 cycles.  Starting from cycle #2 her dose of carboplatin will be reduced to AUC of 4 and etoposide 80 mg/M2 on days 1, 2 and 3 in addition to the regular dose of Tecentriq.  CURRENT THERAPY: Maintenance treatment with single agent Tecentriq 1200 mg IV every 3 weeks.  Status post 56 cycles.  INTERVAL HISTORY: Debra Kidd 61 y.o. female returns to the clinic today for follow-up visit.  The patient is feeling fine today with no concerning complaints but her blood pressure is low.  She had few episodes of diarrhea recently.  She denied having any current chest pain, shortness of breath, cough or hemoptysis.  She has no nausea, vomiting but has few episodes of diarrhea with no constipation or abdominal pain.  She denied having any fever or chills but she lost few pounds since her last  visit.  She still looking for a primary care physician after her primary care provider retired.  The patient had repeat CT scan of the chest, abdomen pelvis performed recently and she is here for evaluation and discussion of her scan results.  MEDICAL HISTORY: Past Medical History:  Diagnosis Date   Basal cell carcinoma of cheek    L side of face   Blood type, Rh positive    Cancer (HCC)    Chronic pain syndrome    Depression 08/07/2016   Encounter for antineoplastic chemotherapy 07/17/2016   History of external beam radiation therapy 11/19/16-12/02/16   brain 25 Gy in 10 fractions   Hyperlipidemia    Hypertension    Hypertension 08/07/2016   Osteoporosis    Small cell lung cancer ([place]) dx'd 05/2016    ALLERGIES:  is allergic to codeine, motrin [ibuprofen], thiazide-type diuretics, and vicodin [hydrocodone-acetaminophen].  MEDICATIONS:  Current Outpatient Medications  Medication Sig Dispense Refill   atorvastatin (LIPITOR) 80 MG tablet Take 1 tablet (80 mg total) by mouth daily. 30 tablet 5   clopidogrel (PLAVIX) 75 MG tablet Take 1 tablet (75 mg total) by mouth daily. 30 tablet 0   diazepam (VALIUM) 5 MG tablet Take 5 mg by mouth 3 (three) times daily as needed for anxiety.   2   lidocaine-prilocaine (EMLA) cream Apply 1 application. topically as needed. 30 g 2   potassium chloride 20 MEQ/15ML (10%) SOLN Take 15 mLs (20 mEq total) by mouth daily. 100 mL 0   prochlorperazine (  COMPAZINE) 10 MG tablet Take 1 tablet (10 mg total) by mouth every 6 (six) hours as needed for nausea or vomiting. 30 tablet 2   traMADol (ULTRAM) 50 MG tablet Take 1 tablet (50 mg total) by mouth 4 (four) times daily as needed for moderate pain (confirmed with her Pharmacy-Dr K. Little 03/15 she received #[id number]). 30 tablet 0   UNABLE TO FIND Med Name: "cholesterol medication"     No current facility-administered medications for this visit.    SURGICAL HISTORY:  Past Surgical History:  Procedure Laterality Date    ABDOMINAL HYSTERECTOMY  1981   APPENDECTOMY     at age 72   EYE SURGERY     left   IR GENERIC HISTORICAL  06/03/2016   IR FLUORO GUIDE PORT INSERTION RIGHT 06/03/2016 [facility]   IR GENERIC HISTORICAL  06/03/2016   IR US GUIDE VASC ACCESS RIGHT 06/03/2016 [facility]   SKIN CANCER EXCISION     basal cell carcinoma L side of face    REVIEW OF SYSTEMS:  Constitutional: positive for anorexia, fatigue, and weight loss Eyes: negative Ears, nose, mouth, throat, and face: negative Respiratory: negative Cardiovascular: negative Gastrointestinal: positive for diarrhea Genitourinary:negative Integument/breast: negative Hematologic/lymphatic: negative Musculoskeletal:positive for muscle weakness Neurological: negative Behavioral/Psych: negative Endocrine: negative Allergic/Immunologic: negative   PHYSICAL EXAMINATION: General appearance: alert, cooperative, fatigued, and no distress Head: Normocephalic, without obvious abnormality, atraumatic Neck: no adenopathy, no JVD, supple, symmetrical, trachea midline, and thyroid not enlarged, symmetric, no tenderness/mass/nodules Lymph nodes: Cervical, supraclavicular, and axillary nodes normal. Resp: clear to auscultation bilaterally Back: symmetric, no curvature. ROM normal. No CVA tenderness. Cardio: regular rate and rhythm, S1, S2 normal, no murmur, click, rub or gallop GI: soft, non-tender; bowel sounds normal; no masses,  no organomegaly Extremities: extremities normal, atraumatic, no cyanosis or edema Neurologic: Alert and oriented X 3, normal strength and tone. Normal symmetric reflexes. Normal coordination and gait  ECOG PERFORMANCE STATUS: 1 - Symptomatic but completely ambulatory  Blood pressure (!) 76/62, pulse 88, temperature 98.7 F (37.1 C), temperature source Oral, resp. rate 16, height 5\' 3"  (1.6 m), weight 102 lb 11.2 oz (46.6 kg), SpO2 100 %.  LABORATORY DATA: Lab Results  Component Value Date   WBC 8.2 01/30/2022    HGB 11.6 (L) 01/30/2022   HCT 34.2 (L) 01/30/2022   MCV 106.9 (H) 01/30/2022   PLT 281 01/30/2022      Chemistry      Component Value Date/Time   NA 138 01/09/2022 1058   NA 141 08/01/2017 0835   K 3.2 (L) 01/09/2022 1058   K 3.5 08/01/2017 0835   CL 104 01/09/2022 1058   CO2 29 01/09/2022 1058   CO2 25 08/01/2017 0835   BUN <5 (L) 01/09/2022 1058   BUN 8.0 08/01/2017 0835   CREATININE 0.65 01/09/2022 1058   CREATININE 0.9 08/01/2017 0835      Component Value Date/Time   CALCIUM 8.4 (L) 01/09/2022 1058   CALCIUM 9.9 08/01/2017 0835   ALKPHOS 135 (H) 01/09/2022 1058   ALKPHOS 142 08/01/2017 0835   AST 28 01/09/2022 1058   AST 15 08/01/2017 0835   ALT 16 01/09/2022 1058   ALT 19 08/01/2017 0835   BILITOT 0.4 01/09/2022 1058   BILITOT 0.35 08/01/2017 0835       RADIOGRAPHIC STUDIES: CT Chest W Contrast  Result Date: 01/25/2022 CLINICAL DATA:  Primary Cancer Type: Lung Imaging Indication: Assess response to therapy Interval therapy since last imaging? Yes Initial Cancer Diagnosis Date:  05/21/2016; Established by: Biopsy-proven Detailed Pathology: Extensive stage small cell lung cancer. Primary Tumor location: Right upper lobe. Pancreatic and splenic metastases. Surgeries: Hysterectomy. Appendectomy. Chemotherapy: Yes; Ongoing? No; Most recent administration: 2019 Immunotherapy?  Yes; Type: Tecentriq; Ongoing? Yes Radiation therapy? Yes 11/04/2017 - 11/10/2017; Target: Right lung Date Range: 11/19/2016 - 12/02/2016; Target: prophylactic cranial irradiation * Tracking Code: BO * EXAM: CT CHEST, ABDOMEN, AND PELVIS WITH CONTRAST TECHNIQUE: Multidetector CT imaging of the chest, abdomen and pelvis was performed following the standard protocol during bolus administration of intravenous contrast. RADIATION DOSE REDUCTION: This exam was performed according to the departmental dose-optimization program which includes automated exposure control, adjustment of the mA and/or kV according  to patient size and/or use of iterative reconstruction technique. CONTRAST:  145mL OMNIPAQUE IOHEXOL 300 MG/ML SOLN, additional oral enteric contrast COMPARISON:  Most recent CT chest, abdomen and pelvis 11/05/2021. FINDINGS: CT CHEST FINDINGS Cardiovascular: Right chest port catheter. Aortic atherosclerosis. Normal heart size. Left and right coronary artery calcifications. No pericardial effusion. Mediastinum/Nodes: No enlarged mediastinal, hilar, or axillary lymph nodes. Thyroid gland, trachea, and esophagus demonstrate no significant findings. Lungs/Pleura: Unchanged post treatment appearance of a mass of the peripheral right apex measuring approximately 4.1 x 2.8 cm (series 7, image 25). No pleural effusion or pneumothorax. Musculoskeletal: No chest wall mass or suspicious osseous lesions identified. CT ABDOMEN PELVIS FINDINGS Hepatobiliary: No solid liver abnormality is seen. Unchanged focal fatty deposition adjacent to the falciform ligament (series 2, image 55). No gallstones, gallbladder wall thickening, or biliary dilatation. Pancreas: Unremarkable. No pancreatic ductal dilatation or surrounding inflammatory changes. Spleen: Normal in size without significant abnormality. Adrenals/Urinary Tract: Adrenal glands are unremarkable. Kidneys are normal, without renal calculi, solid lesion, or hydronephrosis. Bladder is unremarkable. Stomach/Bowel: Stomach is within normal limits. Appendix appears normal. No evidence of bowel wall thickening, distention, or inflammatory changes. Vascular/Lymphatic: Aortic atherosclerosis. No enlarged abdominal or pelvic lymph nodes. Reproductive: Status post hysterectomy. Other: No abdominal wall hernia or abnormality. No ascites. Musculoskeletal: No acute osseous findings. Multiple unchanged thoracic and lumbar spine wedge deformities involving T5 through T8 and L2. IMPRESSION: 1. Unchanged post treatment appearance of a mass of the peripheral right apex. 2. No evidence of  lymphadenopathy or metastatic in the chest, abdomen, or pelvis. 3. Coronary artery disease. Aortic Atherosclerosis (ICD10-I70.0). Electronically Signed   By: Delanna Ahmadi M.D.   On: 01/25/2022 11:56   CT Abdomen Pelvis W Contrast  Result Date: 01/25/2022 CLINICAL DATA:  Primary Cancer Type: Lung Imaging Indication: Assess response to therapy Interval therapy since last imaging? Yes Initial Cancer Diagnosis Date: 05/21/2016; Established by: Biopsy-proven Detailed Pathology: Extensive stage small cell lung cancer. Primary Tumor location: Right upper lobe. Pancreatic and splenic metastases. Surgeries: Hysterectomy. Appendectomy. Chemotherapy: Yes; Ongoing? No; Most recent administration: 2019 Immunotherapy?  Yes; Type: Tecentriq; Ongoing? Yes Radiation therapy? Yes 11/04/2017 - 11/10/2017; Target: Right lung Date Range: 11/19/2016 - 12/02/2016; Target: prophylactic cranial irradiation * Tracking Code: BO * EXAM: CT CHEST, ABDOMEN, AND PELVIS WITH CONTRAST TECHNIQUE: Multidetector CT imaging of the chest, abdomen and pelvis was performed following the standard protocol during bolus administration of intravenous contrast. RADIATION DOSE REDUCTION: This exam was performed according to the departmental dose-optimization program which includes automated exposure control, adjustment of the mA and/or kV according to patient size and/or use of iterative reconstruction technique. CONTRAST:  176mL OMNIPAQUE IOHEXOL 300 MG/ML SOLN, additional oral enteric contrast COMPARISON:  Most recent CT chest, abdomen and pelvis 11/05/2021. FINDINGS: CT CHEST FINDINGS Cardiovascular: Right chest port catheter. Aortic  atherosclerosis. Normal heart size. Left and right coronary artery calcifications. No pericardial effusion. Mediastinum/Nodes: No enlarged mediastinal, hilar, or axillary lymph nodes. Thyroid gland, trachea, and esophagus demonstrate no significant findings. Lungs/Pleura: Unchanged post treatment appearance of a mass of the  peripheral right apex measuring approximately 4.1 x 2.8 cm (series 7, image 25). No pleural effusion or pneumothorax. Musculoskeletal: No chest wall mass or suspicious osseous lesions identified. CT ABDOMEN PELVIS FINDINGS Hepatobiliary: No solid liver abnormality is seen. Unchanged focal fatty deposition adjacent to the falciform ligament (series 2, image 55). No gallstones, gallbladder wall thickening, or biliary dilatation. Pancreas: Unremarkable. No pancreatic ductal dilatation or surrounding inflammatory changes. Spleen: Normal in size without significant abnormality. Adrenals/Urinary Tract: Adrenal glands are unremarkable. Kidneys are normal, without renal calculi, solid lesion, or hydronephrosis. Bladder is unremarkable. Stomach/Bowel: Stomach is within normal limits. Appendix appears normal. No evidence of bowel wall thickening, distention, or inflammatory changes. Vascular/Lymphatic: Aortic atherosclerosis. No enlarged abdominal or pelvic lymph nodes. Reproductive: Status post hysterectomy. Other: No abdominal wall hernia or abnormality. No ascites. Musculoskeletal: No acute osseous findings. Multiple unchanged thoracic and lumbar spine wedge deformities involving T5 through T8 and L2. IMPRESSION: 1. Unchanged post treatment appearance of a mass of the peripheral right apex. 2. No evidence of lymphadenopathy or metastatic in the chest, abdomen, or pelvis. 3. Coronary artery disease. Aortic Atherosclerosis (ICD10-I70.0). Electronically Signed   By: Delanna Ahmadi M.D.   On: 01/25/2022 11:56    ASSESSMENT AND PLAN:  This is a very pleasant 61 years old white female with extensive stage small cell lung cancer status post systemic chemotherapy with carbo platinum and etoposide for 6 cycles and the patient rotated her treatment well except for chemotherapy-induced anemia and requirement for PRBCs and platelet transfusion. She had significant improvement in her disease with the chemotherapy. She also had  prophylactic cranial irradiation. She also underwent stereotactic radiotherapy to progressive right upper lobe pulmonary nodule. The patient has been in observation for close to 2 years. Repeat CT scan of the chest, abdomen and pelvis performed recently showed evidence for disease progression in the abdomen. The patient started on systemic chemotherapy again with carboplatin, etoposide and Tecentriq status post 61 cycles.  Starting from cycle #5 the patient is treated with maintenance single agent Tecentriq. The patient has been tolerating this treatment fairly well with no concerning adverse effects.  She had few episodes of diarrhea recently after her CT scan of the abdomen and pelvis with oral contrast. She had repeat CT scan of the chest, abdomen pelvis performed recently.  I personally and independently reviewed the scans and discussed the result with the patient today. Her scan showed no concerning findings for disease progression. I recommended for the patient to continue her current treatment with maintenance Tecentriq and she will proceed with cycle #62 today. For the hypokalemia, we will arrange for the patient to receive IV potassium chloride in the clinic at least 20 mEq followed by oral 20 mEq p.o. twice daily for 10 days. For the hypotension, this is secondary to lack of appetite, poor p.o. intake as well as diarrhea.  We will arrange for the patient to receive 1 L of normal saline in the clinic today and she was encouraged to increase her oral intake. For the history of stroke, she is followed by neurology and she is currently on Plavix and aspirin. For pain management she is currently on Percocet by her primary care physician Dr. Rex Kras or whoever replaced him as a new primary care  provider. The patient will come back for follow-up visit in 3 weeks for evaluation before the next cycle of her treatment. She was advised to call immediately if she has any other concerning symptoms in the  interval.  All questions were answered. The patient knows to call the clinic with any problems, questions or concerns. We can certainly see the patient much sooner if necessary.  Disclaimer: This note was dictated with voice recognition software. Similar sounding words can inadvertently be transcribed and may not be corrected upon review.

## 2022-01-30 NOTE — Patient Instructions (Signed)
[facility]  Discharge Instructions: Thank you for choosing [facility] to provide your oncology and hematology care.   If you have a lab appointment with the [facility], please go directly to the [facility] and check in at the registration area.   Wear comfortable clothing and clothing appropriate for easy access to any Portacath or PICC line.   We strive to give you quality time with your provider. You may need to reschedule your appointment if you arrive late (15 or more minutes).  Arriving late affects you and other patients whose appointments are after yours.  Also, if you miss three or more appointments without notifying the office, you may be dismissed from the clinic at the provider's discretion.      For prescription refill requests, have your pharmacy contact our office and allow 72 hours for refills to be completed.    Today you received the following chemotherapy and/or immunotherapy agents : Tecentriq      To help prevent nausea and vomiting after your treatment, we encourage you to take your nausea medication as directed.  BELOW ARE SYMPTOMS THAT SHOULD BE REPORTED IMMEDIATELY: *FEVER GREATER THAN 100.4 F (38 C) OR HIGHER *CHILLS OR SWEATING *NAUSEA AND VOMITING THAT IS NOT CONTROLLED WITH YOUR NAUSEA MEDICATION *UNUSUAL SHORTNESS OF BREATH *UNUSUAL BRUISING OR BLEEDING *URINARY PROBLEMS (pain or burning when urinating, or frequent urination) *BOWEL PROBLEMS (unusual diarrhea, constipation, pain near the anus) TENDERNESS IN MOUTH AND THROAT WITH OR WITHOUT PRESENCE OF ULCERS (sore throat, sores in mouth, or a toothache) UNUSUAL RASH, SWELLING OR PAIN  UNUSUAL VAGINAL DISCHARGE OR ITCHING   Items with * indicate a potential emergency and should be followed up as soon as possible or go to the Emergency Department if any problems should occur.  Please show the CHEMOTHERAPY ALERT CARD or IMMUNOTHERAPY ALERT CARD at check-in to  the Emergency Department and triage nurse.  Should you have questions after your visit or need to cancel or reschedule your appointment, please contact [facility]  Dept: [phone]  and follow the prompts.  Office hours are 8:00 a.m. to 4:30 p.m. Monday - Friday. Please note that voicemails left after 4:00 p.m. may not be returned until the following business day.  We are closed weekends and major holidays. You have access to a nurse at all times for urgent questions. Please call the main number to the clinic Dept: [phone] and follow the prompts.   For any non-urgent questions, you may also contact your provider using MyChart. We now offer e-Visits for anyone 22 and older to request care online for non-urgent symptoms. For details visit [email or web address].   Also download the MyChart app! Go to the app store, search "MyChart", open the app, select [facility], and log in with your MyChart username and password.  Due to Covid, a mask is required upon entering the hospital/clinic. If you do not have a mask, one will be given to you upon arrival. For doctor visits, patients may have 1 support person aged 33 or older with them. For treatment visits, patients cannot have anyone with them due to current Covid guidelines and our immunocompromised population.

## 2022-02-04 ENCOUNTER — Ambulatory Visit (INDEPENDENT_AMBULATORY_CARE_PROVIDER_SITE_OTHER): Payer: Medicare Other | Admitting: Adult Health

## 2022-02-04 ENCOUNTER — Encounter: Payer: Self-pay | Admitting: Adult Health

## 2022-02-04 VITALS — BP 101/72 | HR 85

## 2022-02-04 DIAGNOSIS — I6522 Occlusion and stenosis of left carotid artery: Secondary | ICD-10-CM

## 2022-02-04 DIAGNOSIS — E785 Hyperlipidemia, unspecified: Secondary | ICD-10-CM | POA: Diagnosis not present

## 2022-02-04 DIAGNOSIS — Z720 Tobacco use: Secondary | ICD-10-CM

## 2022-02-04 DIAGNOSIS — I639 Cerebral infarction, unspecified: Secondary | ICD-10-CM | POA: Diagnosis not present

## 2022-02-04 MED ORDER — CLOPIDOGREL BISULFATE 75 MG PO TABS: 75.0000 mg | ORAL_TABLET | Freq: Every day | ORAL | 3 refills | Status: DC

## 2022-02-04 MED ORDER — ATORVASTATIN CALCIUM 80 MG PO TABS: 80.0000 mg | ORAL_TABLET | Freq: Every day | ORAL | 11 refills | Status: DC

## 2022-02-04 NOTE — Progress Notes (Signed)
[facility] [address] [place]. [place] [address] [phone]       OFFICE FOLLOW UP NOTE  Debra Kidd Date of Birth:  Sep 19, 1960 Medical Record Number:  [id number]   Referring MD: Debra Seat Hoecklingoetter PA-C  Reason for Referral: Stroke   Chief Complaint  Patient presents with   Follow-up    Rm 3 with husband Debra Kidd here for 3 month f/u. Pt reports she has been doing the same since last f/u.       HPI:   Update 02/04/2022 JM: patient returns for follow up visit after initial consult visit with Dr. Leonie Kidd approx 3 months ago.  She is accompanied by her husband.  Stable from stroke standpoint without new stroke/TIA symptoms. Balance continues to be poor, continue left-sided weakness. Stable since prior visit without worsening. Use of RW at all times, no recent falls.   Compliant on plavix, denies side effects. Stated on atorvastatin 80 mg daily for LDL 155 and multifocal extra and intra cranial stenosis as per CTA head/neck.  Denies side effects.  She has not had repeat lipid panel. Her prior PCP Dr. Rex Kidd has since retired and has had difficulty finding a new PCP. She is requesting refills on statin and plavix today. Blood pressure today 101/72. Does not routinely monitor at home but does have issues with it being low.  Continued tobacco use 1.5 - 2 PPD since the age of 29 (72-96 pack-year history), THC use 2-3x per week.  Denies any other recreational drug use or EtOH use.  She continues to undergo immunotherapy every 3 weeks for recurrent lung cancer.  No further concerns at this time.      History provided for reference purposes only Consult visit 10/25/2021 Dr. Leonie Kidd: Debra Kidd is a 61 year old Caucasian lady seen today for initial office consultation visit.  She is accompanied by her husband.  History is obtained from them and review of electronic medical records and I have personally reviewed available pertinent imaging films in PACS.  She has past  medical history of extensive stage(T2a, N3, [name], large right anterior mediastinal and supraclavicular lymphadenopathy as well as pancreatic and splenic metastasis diagnosed in September 2017.  The patient had disease progression in October 2019.  She has been treated with multiple rounds of chemotherapy, prophylactic cranial irradiation in 2018, stereotactic radiotherapy to the recurrent right upper lobe nodule in 2019.  She also has past medical history of hypertension, hyperlipidemia, osteoporosis, chronic pain syndrome.  She was seen in the ER on 08/24/2021 with increasing left-sided weakness and underwent an MRI scan which I personally reviewed shows old right occipital posterior cerebral artery distribution infarct and a few areas of scattered cerebral microhemorrhages.  There was a weak diffusion positive area in the left parieto-occipital white matter seen only on axial but not on coronal DWI images.  Primary physician has referred the patient for my evaluation to decide on stroke work-up.  Patient did have a previous stroke in May 2020 when she presented with left-sided hemiparesis in the setting of a positive COVID test though she was not having active symptoms of COVID.  At that time the MRI had shown patchy right PCA infarct.  MR angiogram of the brain and neck showed occlusion of the right posterior cerebral artery at its origin, severely diminished flow within the right vertebral artery which is likely severely stenotic and severe stenosis of proximal left common carotid artery.  Patient did not follow-up in neurology clinic after that.  Patient states she has residual left hand weakness from that stroke which has not fully recovered.  Her balance is poor and and she has been using a walker.  She has had no recent falls or injuries.  She denies any significant headaches, vertigo, numbness or weakness.  She is currently on immunotherapy  for her recurrent lung cancer.  She remains on Plavix which is tolerating well without bruising or bleeding.  Blood pressures well controlled today it is 119/86.  She has no complaints today    ROS:   14 system review of systems is positive for those listed in HPI and all other systems negative  PMH:  Past Medical History:  Diagnosis Date   Basal cell carcinoma of cheek    L side of face   Blood type, Rh positive    Cancer (HCC)    Chronic pain syndrome    Depression 08/07/2016   Encounter for antineoplastic chemotherapy 07/17/2016   History of external beam radiation therapy 11/19/16-12/02/16   brain 25 Gy in 10 fractions   Hyperlipidemia    Hypertension    Hypertension 08/07/2016   Osteoporosis    Small cell lung cancer ([place]) dx'd 05/2016    Social History:  Social History   Socioeconomic History   Marital status: Married    Spouse name: Not on file   Number of children: 2   Years of education: Not on file   Highest education level: Not on file  Occupational History   Not on file  Tobacco Use   Smoking status: Every Day    Packs/day: 2.00    Years: 43.00    Pack years: 86.00    Types: Cigarettes   Smokeless tobacco: Never  Vaping Use   Vaping Use: Never used  Substance and Sexual Activity   Alcohol use: Not Currently    Comment: quit drinking beer 02/2016   Drug use: Yes    Frequency: 7.0 times per week    Types: Marijuana   Sexual activity: Not on file  Other Topics Concern   Not on file  Social History Narrative   Married, lives with spouse   2 children   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   Social Determinants of Health   Financial Resource Strain: High Risk   Difficulty of Paying Living Expenses: Very hard  Food Insecurity: Food Insecurity Present   Worried About Charity fundraiser in the Last Year: Sometimes true   Ran Out of Food in the Last Year: Sometimes true  Transportation Needs: No  Transportation Needs   Lack of Transportation (Medical): No   Lack of Transportation (Non-Medical): No  Physical Activity: Not on file  Stress: Stress Concern Present   Feeling of Stress : To some extent  Social Connections: Moderately Isolated   Frequency of Communication with Friends and Family: More than three times a week   Frequency of Social Gatherings with Friends and Family: More than three times a week   Attends Religious Services: Never   Marine scientist or Organizations: No   Attends Archivist Meetings: Never   Marital Status: Married  Human resources officer Violence: Not on file    Medications:   Current Outpatient Medications on File Prior to Visit  Medication Sig Dispense Refill   atorvastatin (LIPITOR) 80 MG tablet Take 1 tablet (80 mg total) by mouth daily. 30 tablet  5   clopidogrel (PLAVIX) 75 MG tablet Take 1 tablet (75 mg total) by mouth daily. 30 tablet 0   diazepam (VALIUM) 5 MG tablet Take 5 mg by mouth 3 (three) times daily as needed for anxiety.   2   lidocaine-prilocaine (EMLA) cream Apply 1 application. topically as needed. 30 g 2   potassium chloride 20 MEQ/15ML (10%) SOLN Take 15 mLs (20 mEq total) by mouth daily. 100 mL 0   traMADol (ULTRAM) 50 MG tablet Take 1 tablet (50 mg total) by mouth 4 (four) times daily as needed for moderate pain (confirmed with her Pharmacy-Dr K. Little 03/15 she received #120). 30 tablet 0   UNABLE TO FIND Med Name: "cholesterol medication"     No current facility-administered medications on file prior to visit.    Allergies:   Allergies  Allergen Reactions   Codeine Nausea And Vomiting   Motrin [Ibuprofen]     Elevation of BP   [name]     intolerant   Vicodin [Hydrocodone-Acetaminophen] Nausea And Vomiting    Physical Exam Today's Vitals   02/04/22 1338  BP: 101/72  Pulse: 85   There is no height or weight on file to calculate BMI.  General: Frail malnourished looking pleasant  middle-aged Caucasian lady, seated, in no evident distress Head: head normocephalic and atraumatic.   Neck: supple with no carotid or supraclavicular bruits Cardiovascular: regular rate and rhythm, no murmurs Musculoskeletal: no deformity Skin:  no rash/petichiae IV port in the right neck and upper chest. Vascular:  Normal pulses all extremities  Neurologic Exam Mental Status: Awake and fully alert. Oriented to place and time. Recent and remote memory intact. Attention span, concentration and fund of knowledge appropriate. Mood and affect appropriate.  Cranial Nerves: Pupils equal, briskly reactive to light. Extraocular movements full without nystagmus. Visual fields full to confrontation. Hearing intact. Facial sensation intact.  Left facial movements limited on the left due to previous basal cell carcinoma surgery., tongue, palate moves normally and symmetrically.  Motor: Mild left hemiparesis 4/5 in the upper extremity with mild grip weakness and diminished fine motor skills.  Left lower extremity 4+/5 with slightly increased tone.  Normal strength in the right. Sensory.: intact to touch , pinprick , position and vibratory sensation.  Coordination: Rapid alternating movements normal in all extremities. Finger-to-nose and heel-to-shin performed accurately bilaterally. Gait and Station: Deferred as RW not present  Reflexes: 1+ and symmetric. Toes downgoing.        ASSESSMENT/PLAN: 61 year old Caucasian lady with 2 to 3-day history of left-sided weakness in May 2020 due to right posterior cerebral artery and brainstem as well as MCA/PCA bilateral infarcts of cryptogenic etiology.  Negative work-up for source of embolism at that time.  Recent MRI of the brain on 08/24/2021 showing punctate focus of restricted diffusion in the left occipital lobe possibly a small subacute infarct which was silent.  Vascular risk factors include prior strokes, multifocal extra and intracranial stenosis, tobacco  use, THC use and hyperlipidemia.    -Continue Plavix (clopidogrel ) 75 mg daily and atorvastatin 80mg  daily for secondary stroke prevention -refill provided but discussed ongoing refills will need to be obtained by PCP once established -Needs to find a new PCP provider as her prior PCP has since retired, routine follow up with PCP for aggressive stroke risk factor management including BP goal<130/90 with avoidance of hypotension in setting of intra and extracranial stenosis and HLD with LDL goal<70 -stroke labs 10/2021: A1c 5.1, LDL 155 (started on  statin at that time) -repeat lipid panel today -Discussed importance of complete tobacco and THC cessation as continued use greatly increases risk of additional strokes and worsening of multifocal stenosis.  She verbalized understanding but unfortunately, has no desire to quit.     Follow-up in 6 months or call earlier if needed    I spent 31 minutes of face-to-face and non-face-to-face time with patient and husband.  This included previsit chart review, lab review, study review, order entry, electronic health record documentation, patient and husband education and discussion regarding history of prior strokes with residual deficits, secondary stroke prevention measures and aggressive stroke risk factor management and answered all other questions to patient and husband satisfaction  Frann Rider, AGNP-BC  [facility] [address] [place], [place] [address]  Phone [phone] Fax [phone] Note: This document was prepared with digital dictation and possible smart phrase technology. Any transcriptional errors that result from this process are unintentional.

## 2022-02-04 NOTE — Patient Instructions (Signed)
Continue clopidogrel 75 mg daily  and atorvastatin 80 mg daily for secondary stroke prevention  Continue to follow up with PCP regarding cholesterol and blood pressure management  Maintain strict control of hypertension with blood pressure goal below 130/90 with avoidance of hypotension and cholesterol with LDL cholesterol (bad cholesterol) goal below 70 mg/dL.   Signs of a Stroke? Follow the BEFAST method:  Balance Watch for a sudden loss of balance, trouble with coordination or vertigo Eyes Is there a sudden loss of vision in one or both eyes? Or double vision?  Face: Ask the person to smile. Does one side of the face droop or is it numb?  Arms: Ask the person to raise both arms. Does one arm drift downward? Is there weakness or numbness of a leg? Speech: Ask the person to repeat a simple phrase. Does the speech sound slurred/strange? Is the person confused ? Time: If you observe any of these signs, call 911.     Followup in the future with me in 6 months or call earlier if needed      Thank you for coming to see [place] at [facility]. I hope we have been able to provide you high quality care today.  You may receive a patient satisfaction survey over the next few weeks. We would appreciate your feedback and comments so that we may continue to improve ourselves and the health of our patients.

## 2022-02-05 ENCOUNTER — Telehealth: Payer: Self-pay | Admitting: *Deleted

## 2022-02-05 LAB — LIPID PANEL
Chol/HDL Ratio: 3.3 ratio (ref 0.0–4.4)
Cholesterol, Total: 130 mg/dL (ref 100–199)
HDL: 39 mg/dL — ABNORMAL LOW (ref >39)
LDL Chol Calc (NIH): 65 mg/dL (ref 0–99)
Triglycerides: 151 mg/dL — ABNORMAL HIGH (ref 0–149)
VLDL Cholesterol Cal: 26 mg/dL (ref 5–40)

## 2022-02-05 NOTE — Telephone Encounter (Signed)
Spoke with patient and informed her that recent cholesterol levels showed excellent improvement of LDL or bad cholesterol currently at 65 with goal less than 70.  Continue current statin at this time.  We can plan on repeat at follow-up visit or once she establishes care with a PCP who will then take over cholesterol monitoring/management. Patient verbalized understanding, appreciation.

## 2022-02-15 NOTE — Progress Notes (Unsigned)
[place] OFFICE PROGRESS NOTE  Pcp, [name], N3, [name], large right anterior mediastinal and supraclavicular lymphadenopathy as well as pancreatic and splenic metastasis diagnosed in September 2017.  The patient had disease progression in October 2019.  PRIOR THERAPY: ) Systemic chemotherapy was carboplatin for AUC of 5 on day 1 and etoposide 100 MG/M2 on days 1, 2 and 3 with Neulasta support. Status post 6 cycles with significant response of her disease. 2) Prophylactic cranial irradiation under the care of Dr. Sondra Come on 12/02/2016. 3) stereotactic radiotherapy to the recurrent right upper lobe pulmonary nodule under the care of Dr. Sondra Come completed November 10, 2017. 4) Retreatment with systemic chemotherapy with carboplatin for AUC of 5 on day 1 and etoposide 100 mg/M2 on days 1, 2 and 3 as well as Tecentriq (Atezolizumab) 1200 mg IV every 3 weeks with Neulasta support.  First dose June 22, 2018 for disease recurrence.  Status post 5 cycles.  Starting from cycle #2 her dose of carboplatin will be reduced to AUC of 4 and etoposide 80 mg/M2 on days 1, 2 and 3 in addition to the regular dose of Tecentriq.    CURRENT THERAPY:  Maintenance treatment with single agent Tecentriq 1200 mg IV every 3 weeks.  Status post 62 cycles.  INTERVAL HISTORY: Debra Kidd 61 y.o. female returns to the clinic today for a follow-up visit.  The patient is feeling fair today without any concerning complaints except for persistent fatigue and generalized weakness. She is not very active. She does not drink protein shakes. Denies any medication changes. She sleeps a lot of the day. She is taking a lot of medication that can make her drowsy. The patient's primary care provider had retired and the patient still has not established with a new primary doctor.  I gave the patient a list of  suggested practices several appointments ago with phone numbers to call to see if they are accepting new patients.  She has not done so at this time.  She follows closely with neurology due to her history of chronic left-sided weakness due to her history of stroke and multiple vascular issues.  She is currently undergoing immunotherapy with Tecentriq and she tolerates it fairly well.  She previously occasionally have dry skin and itching for which she was prescribed Atarax but it would make her drowsy and dizzy so she has not been using it.  She tried taking Claritin but states this made her drowsy too.  She has not tried taking Zyrtec. She has decreased in appetite and weight loss which she has been followed by member the nutritionist team.  She was given complementary ensures in the past and information about a food pantry.  She is using [employer] breakfast instant breakfast.  She does not hydrate well at home and does not drink a lot of fluids.  They are scheduled to see her in the infusion room today.  She also mentions that she received a complementary bag of food this morning from the cancer center.  She reports she has some financial constraints and is submitting paperwork today to renew her insurance for her cancer treatment. Otherwise she denies any fever, chills, or night sweats.  She denies any nausea, vomiting, or constipation.  She sometimes intermittent diarrhea.  She does not take Imodium.  She reports baseline dyspnea on exertion without any chest pain or  hemoptysis.  She has a baseline mild cough.  She continues to smoke 2 packs of cigarettes per day.  She is here today for evaluation and repeat blood work before undergoing cycle #63.  MEDICAL HISTORY: Past Medical History:  Diagnosis Date   Basal cell carcinoma of cheek    L side of face   Blood type, Rh positive    Cancer (HCC)    Chronic pain syndrome    Depression 08/07/2016   Encounter for antineoplastic chemotherapy 07/17/2016    History of external beam radiation therapy 11/19/16-12/02/16   brain 25 Gy in 10 fractions   Hyperlipidemia    Hypertension    Hypertension 08/07/2016   Osteoporosis    Small cell lung cancer ([place]) dx'd 05/2016    ALLERGIES:  is allergic to codeine, motrin [ibuprofen], thiazide-type diuretics, and vicodin [hydrocodone-acetaminophen].  MEDICATIONS:  Current Outpatient Medications  Medication Sig Dispense Refill   atorvastatin (LIPITOR) 80 MG tablet Take 1 tablet (80 mg total) by mouth daily. 30 tablet 11   clopidogrel (PLAVIX) 75 MG tablet Take 1 tablet (75 mg total) by mouth daily. 90 tablet 3   diazepam (VALIUM) 5 MG tablet Take 5 mg by mouth 3 (three) times daily as needed for anxiety.   2   lidocaine-prilocaine (EMLA) cream Apply 1 application. topically as needed. 30 g 2   potassium chloride 20 MEQ/15ML (10%) SOLN Take 15 mLs (20 mEq total) by mouth daily. 100 mL 0   traMADol (ULTRAM) 50 MG tablet Take 1 tablet (50 mg total) by mouth 4 (four) times daily as needed for moderate pain (confirmed with her Pharmacy-Dr K. Little 03/15 she received #120). 30 tablet 0   UNABLE TO FIND Med Name: "cholesterol medication"     No current facility-administered medications for this visit.   Facility-Administered Medications Ordered in Other Visits  Medication Dose Route Frequency Provider Last Rate Last Admin   0.9 %  sodium chloride infusion   Intravenous Continuous Dewon Mendizabal L, PA-C       0.9 %  sodium chloride infusion   Intravenous Once [name], MD       atezolizumab [facility]) 1,200 mg in sodium chloride 0.9 % 250 mL chemo infusion  1,200 mg Intravenous Once [name], MD       heparin lock flush 100 unit/mL  500 Units Intracatheter Once PRN [name], MD       sodium chloride flush (NS) 0.9 % injection 10 mL  10 mL Intracatheter PRN [name], MD        SURGICAL HISTORY:  Past Surgical History:  Procedure Laterality Date   ABDOMINAL  HYSTERECTOMY  61   APPENDECTOMY     at age 56   [place]     left   IR GENERIC HISTORICAL  06/03/2016   IR FLUORO GUIDE PORT INSERTION RIGHT 06/03/2016 [facility]   IR GENERIC HISTORICAL  06/03/2016   IR US GUIDE VASC ACCESS RIGHT 06/03/2016 [facility]   SKIN CANCER EXCISION     basal cell carcinoma L side of face    REVIEW OF SYSTEMS:   Review of Systems  Constitutional: Positive for decreased appetite and fatigue. Negative for  chills, fever and unexpected weight change.  HENT: Negative for mouth sores, nosebleeds, sore throat and trouble swallowing.   Eyes: Negative for eye problems and icterus.  Respiratory: Positive for SOB and mild cough which are her baseline. Negative for hemoptysis, and wheezing.   Cardiovascular: Negative for chest pain and  leg swelling.  Gastrointestinal: Positive for chronic diarrhea.  Negative for abdominal pain, constipation,  nausea and vomiting.  Genitourinary: Negative for bladder incontinence, difficulty urinating, dysuria, frequency and hematuria.   Musculoskeletal: Negative for back pain, gait problem, neck pain and neck stiffness.  Skin: Negative for itching and rash.  Neurological: Positive for generalized weakness.  Positive for left-sided weakness due to her history of stroke.  Negative for extremity weakness, gait problem, headaches, [name], depression and sleep disturbance. The patient is not nervous/anxious.    PHYSICAL EXAMINATION:  Blood pressure (!) 89/60, pulse 79, temperature 97.8 F (36.6 C), temperature source Tympanic, resp. rate 18, height 5\' 3"  (1.6 m), weight 100 lb 9.6 oz (45.6 kg), SpO2 99 %.  ECOG PERFORMANCE STATUS: 2-3  Physical Exam  Constitutional: Oriented to person, place, and time and chronically ill appearing female appears older than stated age and in no  distress. HENT: Head: Normocephalic and atraumatic. Mouth/Throat: Oropharynx is clear and moist. No oropharyngeal exudate. Eyes: Conjunctivae are normal. Right eye exhibits no discharge. Left eye exhibits no discharge. No scleral icterus. Neck: Normal range of motion. Neck supple. Cardiovascular: Normal rate, regular rhythm, normal heart sounds and intact distal pulses.   Pulmonary/Chest: Effort normal.  Quiet breath sounds in all lung fields.  No respiratory distress. No wheezes. No rales. Abdominal: Soft. Bowel sounds are normal. Exhibits no distension and no mass. There is no tenderness.  Musculoskeletal: Normal range of motion. Exhibits no edema.  Lymphadenopathy:    No cervical adenopathy.  Neurological: Alert and oriented to person, place, and time. Exhibits muscle wasting. Examined in the wheelchair. Chronic left sided weakness due to hx of CVA.  Skin: Skin is warm and dry. No rash noted. Not diaphoretic. No erythema. No pallor.  Psychiatric: Mood, memory and judgment normal. Vitals reviewed.  LABORATORY DATA: Lab Results  Component Value Date   WBC 7.3 02/20/2022   HGB 11.6 (L) 02/20/2022   HCT 33.5 (L) 02/20/2022   MCV 106.7 (H) 02/20/2022   PLT 266 02/20/2022      Chemistry      Component Value Date/Time   NA 139 02/20/2022 1013   NA 141 08/01/2017 0835   K 3.2 (L) 02/20/2022 1013   K 3.5 08/01/2017 0835   CL 104 02/20/2022 1013   CO2 31 02/20/2022 1013   CO2 25 08/01/2017 0835   BUN <5 (L) 02/20/2022 1013   BUN 8.0 08/01/2017 0835   CREATININE 0.65 02/20/2022 1013   CREATININE 0.9 08/01/2017 0835      Component Value Date/Time   CALCIUM 8.7 (L) 02/20/2022 1013   CALCIUM 9.9 08/01/2017 0835   ALKPHOS 135 (H) 02/20/2022 1013   ALKPHOS 142 08/01/2017 0835   AST 21 02/20/2022 1013   AST 15 08/01/2017 0835   ALT 13 02/20/2022 1013   ALT 19 08/01/2017 0835   BILITOT 0.4 02/20/2022 1013   BILITOT 0.35 08/01/2017 0835       RADIOGRAPHIC STUDIES:  CT Chest  W Contrast  Result Date: 01/25/2022 CLINICAL DATA:  Primary Cancer Type: Lung Imaging Indication: Assess response to therapy Interval therapy since last imaging? Yes Initial Cancer Diagnosis Date: 05/21/2016; Established by: Biopsy-proven Detailed Pathology: Extensive stage small cell lung cancer. Primary Tumor location: Right upper lobe. Pancreatic and splenic metastases. Surgeries: Hysterectomy. Appendectomy. Chemotherapy: Yes; Ongoing? No; Most recent administration: 2019 Immunotherapy?  Yes; Type: Tecentriq; Ongoing? Yes Radiation therapy? Yes 11/04/2017 -  11/10/2017; Target: Right lung Date Range: 11/19/2016 - 12/02/2016; Target: prophylactic cranial irradiation * Tracking Code: BO * EXAM: CT CHEST, ABDOMEN, AND PELVIS WITH CONTRAST TECHNIQUE: Multidetector CT imaging of the chest, abdomen and pelvis was performed following the standard protocol during bolus administration of intravenous contrast. RADIATION DOSE REDUCTION: This exam was performed according to the departmental dose-optimization program which includes automated exposure control, adjustment of the mA and/or kV according to patient size and/or use of iterative reconstruction technique. CONTRAST:  126mL OMNIPAQUE IOHEXOL 300 MG/ML SOLN, additional oral enteric contrast COMPARISON:  Most recent CT chest, abdomen and pelvis 11/05/2021. FINDINGS: CT CHEST FINDINGS Cardiovascular: Right chest port catheter. Aortic atherosclerosis. Normal heart size. Left and right coronary artery calcifications. No pericardial effusion. Mediastinum/Nodes: No enlarged mediastinal, hilar, or axillary lymph nodes. Thyroid gland, trachea, and esophagus demonstrate no significant findings. Lungs/Pleura: Unchanged post treatment appearance of a mass of the peripheral right apex measuring approximately 4.1 x 2.8 cm (series 7, image 25). No pleural effusion or pneumothorax. Musculoskeletal: No chest wall mass or suspicious osseous lesions identified. CT ABDOMEN PELVIS  FINDINGS Hepatobiliary: No solid liver abnormality is seen. Unchanged focal fatty deposition adjacent to the falciform ligament (series 2, image 55). No gallstones, gallbladder wall thickening, or biliary dilatation. Pancreas: Unremarkable. No pancreatic ductal dilatation or surrounding inflammatory changes. Spleen: Normal in size without significant abnormality. Adrenals/Urinary Tract: Adrenal glands are unremarkable. Kidneys are normal, without renal calculi, solid lesion, or hydronephrosis. Bladder is unremarkable. Stomach/Bowel: Stomach is within normal limits. Appendix appears normal. No evidence of bowel wall thickening, distention, or inflammatory changes. Vascular/Lymphatic: Aortic atherosclerosis. No enlarged abdominal or pelvic lymph nodes. Reproductive: Status post hysterectomy. Other: No abdominal wall hernia or abnormality. No ascites. Musculoskeletal: No acute osseous findings. Multiple unchanged thoracic and lumbar spine wedge deformities involving T5 through T8 and L2. IMPRESSION: 1. Unchanged post treatment appearance of a mass of the peripheral right apex. 2. No evidence of lymphadenopathy or metastatic in the chest, abdomen, or pelvis. 3. Coronary artery disease. Aortic Atherosclerosis (ICD10-I70.0). Electronically Signed   By: Delanna Ahmadi M.D.   On: 01/25/2022 11:56   CT Abdomen Pelvis W Contrast  Result Date: 01/25/2022 CLINICAL DATA:  Primary Cancer Type: Lung Imaging Indication: Assess response to therapy Interval therapy since last imaging? Yes Initial Cancer Diagnosis Date: 05/21/2016; Established by: Biopsy-proven Detailed Pathology: Extensive stage small cell lung cancer. Primary Tumor location: Right upper lobe. Pancreatic and splenic metastases. Surgeries: Hysterectomy. Appendectomy. Chemotherapy: Yes; Ongoing? No; Most recent administration: 2019 Immunotherapy?  Yes; Type: Tecentriq; Ongoing? Yes Radiation therapy? Yes 11/04/2017 - 11/10/2017; Target: Right lung Date Range:  11/19/2016 - 12/02/2016; Target: prophylactic cranial irradiation * Tracking Code: BO * EXAM: CT CHEST, ABDOMEN, AND PELVIS WITH CONTRAST TECHNIQUE: Multidetector CT imaging of the chest, abdomen and pelvis was performed following the standard protocol during bolus administration of intravenous contrast. RADIATION DOSE REDUCTION: This exam was performed according to the departmental dose-optimization program which includes automated exposure control, adjustment of the mA and/or kV according to patient size and/or use of iterative reconstruction technique. CONTRAST:  180mL OMNIPAQUE IOHEXOL 300 MG/ML SOLN, additional oral enteric contrast COMPARISON:  Most recent CT chest, abdomen and pelvis 11/05/2021. FINDINGS: CT CHEST FINDINGS Cardiovascular: Right chest port catheter. Aortic atherosclerosis. Normal heart size. Left and right coronary artery calcifications. No pericardial effusion. Mediastinum/Nodes: No enlarged mediastinal, hilar, or axillary lymph nodes. Thyroid gland, trachea, and esophagus demonstrate no significant findings. Lungs/Pleura: Unchanged post treatment appearance of a mass of the peripheral right apex measuring  approximately 4.1 x 2.8 cm (series 7, image 25). No pleural effusion or pneumothorax. Musculoskeletal: No chest wall mass or suspicious osseous lesions identified. CT ABDOMEN PELVIS FINDINGS Hepatobiliary: No solid liver abnormality is seen. Unchanged focal fatty deposition adjacent to the falciform ligament (series 2, image 55). No gallstones, gallbladder wall thickening, or biliary dilatation. Pancreas: Unremarkable. No pancreatic ductal dilatation or surrounding inflammatory changes. Spleen: Normal in size without significant abnormality. Adrenals/Urinary Tract: Adrenal glands are unremarkable. Kidneys are normal, without renal calculi, solid lesion, or hydronephrosis. Bladder is unremarkable. Stomach/Bowel: Stomach is within normal limits. Appendix appears normal. No evidence of bowel  wall thickening, distention, or inflammatory changes. Vascular/Lymphatic: Aortic atherosclerosis. No enlarged abdominal or pelvic lymph nodes. Reproductive: Status post hysterectomy. Other: No abdominal wall hernia or abnormality. No ascites. Musculoskeletal: No acute osseous findings. Multiple unchanged thoracic and lumbar spine wedge deformities involving T5 through T8 and L2. IMPRESSION: 1. Unchanged post treatment appearance of a mass of the peripheral right apex. 2. No evidence of lymphadenopathy or metastatic in the chest, abdomen, or pelvis. 3. Coronary artery disease. Aortic Atherosclerosis (ICD10-I70.0). Electronically Signed   By: Delanna Ahmadi M.D.   On: 01/25/2022 11:56     ASSESSMENT/PLAN:  This is a very pleasant 61 year old Caucasian female with extensive stage small cell lung cancer.  She presented with a right upper lobe lung mass, large right anterior mediastinal and supraclavicular lymphadenopathy as well as a pancreatic and splenic metastasis.  She was diagnosed in September 2017.   The patient underwent systemic chemotherapy with carboplatin and etoposide.  She is status post 6 cycles.  She tolerated treatment well except for chemotherapy-induced anemia which required  pRBCs and platelet transfusions.  She had a significant improvement of her disease with chemotherapy. The patient then underwent prophylactic cranial irradiation. She then underwent stereotactic radiotherapy to the right upper lobe and pulmonary nodule.   She had been on observation for 2 years before showing evidence of disease progression.    She then was started on systemic chemotherapy with carboplatin, etoposide, and Tecentriq. Starting from cycle #5, she has been on maintenance single agent Tecentriq.  She has been tolerating treatment well. She is status post 62  cycles of Tecentriq total.    Labs were reviewed.  Recommend that she proceed with cycle #63 today as scheduled.  Her potassium continues to be a  little bit low at 3.2.  I encouraged her to take 20 mEq of potassium daily for 7 days.  She states she has her liquid potassium prescription at home.  I discussed with the patient that she is likely hypotensive, feeling weak, and dehydrated because she does not take Imodium for her diarrhea.  I strongly encouraged her to take Imodium if needed for diarrhea.  We will arrange for her to receive 1 L of fluid while in the infusion room today.  She also was encouraged to hydrate at home.  The patient is not very physically active and does not take great care of her health.  She is scheduled to meet with the member the nutritionist team today.  She received a complementary bad of food.  The patient states she has financial constraints and I reach out to social work as well as the Mining engineer to see if there is any additional resources that she qualifies for.  They are going to see her while she is in the infusion room today.  She also submitted a form for her renewal for her insurance for her cancer treatment.  I will ensure that this gets to the managed care office today.  We will see her back for follow-up visit in 3 weeks for evaluation and repeat blood work before undergoing cycle #64.  Discussed with her previously that I will would refill her medications for a 1 month supply until she can establish with a PCP.  She has not established with a PCP.  I previously gave the patient a list of PCP offices in her area to call and see if they are excepting new patients.   Encouraged the patient to try taking Zyrtec if Claritin makes her drowsy.   The patient was advised to call immediately if she has any concerning symptoms in the interval. The patient voices understanding of current disease status and treatment options and is in agreement with the current care plan. All questions were answered. The patient knows to call the clinic with any problems, questions or concerns. We can certainly see the patient  much sooner if necessary    No orders of the defined types were placed in this encounter.    The total time spent in the appointment was 30-39 minutes.   Deagen Krass L Kam Kushnir, PA-C 02/20/22

## 2022-02-20 ENCOUNTER — Other Ambulatory Visit: Payer: Self-pay

## 2022-02-20 ENCOUNTER — Inpatient Hospital Stay: Payer: Medicare Other | Attending: Internal Medicine | Admitting: Physician Assistant

## 2022-02-20 ENCOUNTER — Encounter: Payer: Self-pay | Admitting: Licensed Clinical Social Worker

## 2022-02-20 ENCOUNTER — Inpatient Hospital Stay: Payer: Medicare Other

## 2022-02-20 ENCOUNTER — Encounter: Payer: Self-pay | Admitting: Internal Medicine

## 2022-02-20 ENCOUNTER — Inpatient Hospital Stay: Payer: Medicare Other | Admitting: Dietician

## 2022-02-20 VITALS — BP 104/74 | HR 79 | Temp 97.8°F | Resp 18

## 2022-02-20 VITALS — BP 89/60 | HR 79 | Temp 97.8°F | Resp 18 | Ht 63.0 in | Wt 100.6 lb

## 2022-02-20 DIAGNOSIS — I959 Hypotension, unspecified: Secondary | ICD-10-CM

## 2022-02-20 DIAGNOSIS — Z5112 Encounter for antineoplastic immunotherapy: Secondary | ICD-10-CM

## 2022-02-20 DIAGNOSIS — C3411 Malignant neoplasm of upper lobe, right bronchus or lung: Secondary | ICD-10-CM | POA: Insufficient documentation

## 2022-02-20 DIAGNOSIS — R5383 Other fatigue: Secondary | ICD-10-CM

## 2022-02-20 DIAGNOSIS — C3491 Malignant neoplasm of unspecified part of right bronchus or lung: Secondary | ICD-10-CM

## 2022-02-20 DIAGNOSIS — C7889 Secondary malignant neoplasm of other digestive organs: Secondary | ICD-10-CM | POA: Insufficient documentation

## 2022-02-20 DIAGNOSIS — Z79899 Other long term (current) drug therapy: Secondary | ICD-10-CM | POA: Diagnosis not present

## 2022-02-20 LAB — CMP (CANCER CENTER ONLY)
ALT: 13 U/L (ref 0–44)
AST: 21 U/L (ref 15–41)
Albumin: 3.3 g/dL — ABNORMAL LOW (ref 3.5–5.0)
Alkaline Phosphatase: 135 U/L — ABNORMAL HIGH (ref 38–126)
Anion gap: 4 — ABNORMAL LOW (ref 5–15)
BUN: <5 mg/dL — ABNORMAL LOW (ref 8–23)
CO2: 31 mmol/L (ref 22–32)
Calcium: 8.7 mg/dL — ABNORMAL LOW (ref 8.9–10.3)
Chloride: 104 mmol/L (ref 98–111)
Creatinine: 0.65 mg/dL (ref 0.44–1.00)
GFR, Estimated: >60 mL/min (ref >60)
Glucose, Bld: 111 mg/dL — ABNORMAL HIGH (ref 70–99)
Potassium: 3.2 mmol/L — ABNORMAL LOW (ref 3.5–5.1)
Sodium: 139 mmol/L (ref 135–145)
Total Bilirubin: 0.4 mg/dL (ref 0.3–1.2)
Total Protein: 6 g/dL — ABNORMAL LOW (ref 6.5–8.1)

## 2022-02-20 LAB — CBC WITH DIFFERENTIAL (CANCER CENTER ONLY)
Abs Immature Granulocytes: 0.03 10*3/uL (ref 0.00–0.07)
Basophils Absolute: 0 10*3/uL (ref 0.0–0.1)
Basophils Relative: 0 %
Eosinophils Absolute: 0.2 10*3/uL (ref 0.0–0.5)
Eosinophils Relative: 3 %
HCT: 33.5 % — ABNORMAL LOW (ref 36.0–46.0)
Hemoglobin: 11.6 g/dL — ABNORMAL LOW (ref 12.0–15.0)
Immature Granulocytes: 0 %
Lymphocytes Relative: 30 %
Lymphs Abs: 2.2 10*3/uL (ref 0.7–4.0)
MCH: 36.9 pg — ABNORMAL HIGH (ref 26.0–34.0)
MCHC: 34.6 g/dL (ref 30.0–36.0)
MCV: 106.7 fL — ABNORMAL HIGH (ref 80.0–100.0)
Monocytes Absolute: 0.4 10*3/uL (ref 0.1–1.0)
Monocytes Relative: 6 %
Neutro Abs: 4.5 10*3/uL (ref 1.7–7.7)
Neutrophils Relative %: 61 %
Platelet Count: 266 10*3/uL (ref 150–400)
RBC: 3.14 MIL/uL — ABNORMAL LOW (ref 3.87–5.11)
RDW: 13.2 % (ref 11.5–15.5)
WBC Count: 7.3 10*3/uL (ref 4.0–10.5)
nRBC: 0 % (ref 0.0–0.2)

## 2022-02-20 LAB — TSH: TSH: 1.774 u[IU]/mL (ref 0.350–4.500)

## 2022-02-20 MED ORDER — SODIUM CHLORIDE 0.9 % IV SOLN: INTRAVENOUS | Status: DC

## 2022-02-20 MED ORDER — SODIUM CHLORIDE 0.9 % IV SOLN: Freq: Once | INTRAVENOUS | Status: AC

## 2022-02-20 MED ORDER — HEPARIN SOD (PORK) LOCK FLUSH 100 UNIT/ML IV SOLN
500.0000 [IU] | Freq: Once | INTRAVENOUS | Status: AC | PRN
  Administered 2022-02-20: 500 [IU]

## 2022-02-20 MED ORDER — SODIUM CHLORIDE 0.9 % IV SOLN
1200.0000 mg | Freq: Once | INTRAVENOUS | Status: AC
  Administered 2022-02-20: 1200 mg via INTRAVENOUS
  Filled 2022-02-20: qty 20

## 2022-02-20 MED ORDER — SODIUM CHLORIDE 0.9% FLUSH
10.0000 mL | INTRAVENOUS | Status: DC | PRN
  Administered 2022-02-20: 10 mL

## 2022-02-20 NOTE — Progress Notes (Signed)
Pt was approved w/ the [employer] for Tecentriq for $5,200 from 11/22/21 to 02/20/23.

## 2022-02-20 NOTE — Progress Notes (Signed)
Nutrition Follow-up:  Patient with extensive stage small cell lung cancer. She is currently receiving maintenance treatment with single agent Tecentriq 1200 mg IV every 3 weeks.   Met with patient in infusion. She reports appetite has been "a little bit better recently. Patient recalls spaghetti, pot roast, stew beef. She does not eat breakfast except on Sundays. Patient is drinking one CIB every other day. Patient denies nausea, vomiting, diarrhea, constipation.    Medications: reviewed   Labs: K 3.2 (trending up), Glucose 111, BUN <5  Anthropometrics: Patient with ongoing gradual weight loss. Weight 100 lb 9.6 oz today decreased 2 lbs in 3 weeks; insignificant for time frame however pt is now underweight  5/31 - 102 lb 11.2 oz 5/10 - 104 lb 8 oz  4/19 - 104 lb 14.4 oz  3/29 - 108 lb 3.2 oz 3/8 - 107 lb 9.6 oz  NUTRITION DIAGNOSIS: Unintended weight loss ongoing    INTERVENTION:  Encouraged high calorie high protein foods Patient forgot to pick up food bag and Ensure case on 5/31- husband picked these up today Instructed pt to drink 2 Ensure or 1 Ensure + 1 CIB daily Provided samples of Pedialyte hydration packets (strawberry lemonade) as well as Unjury protein packets (variety of flavors)      MONITORING, EVALUATION, GOAL: weight trends, intake   NEXT VISIT: Tuesday July 11 in infusion

## 2022-02-20 NOTE — Patient Instructions (Signed)
[facility]  Discharge Instructions: Thank you for choosing [facility] to provide your oncology and hematology care.   If you have a lab appointment with the [facility], please go directly to the [facility] and check in at the registration area.   Wear comfortable clothing and clothing appropriate for easy access to any Portacath or PICC line.   We strive to give you quality time with your provider. You may need to reschedule your appointment if you arrive late (15 or more minutes).  Arriving late affects you and other patients whose appointments are after yours.  Also, if you miss three or more appointments without notifying the office, you may be dismissed from the clinic at the provider's discretion.      For prescription refill requests, have your pharmacy contact our office and allow 72 hours for refills to be completed.    Today you received the following chemotherapy and/or immunotherapy agents: atezolizumab and IV fluids   To help prevent nausea and vomiting after your treatment, we encourage you to take your nausea medication as directed.  BELOW ARE SYMPTOMS THAT SHOULD BE REPORTED IMMEDIATELY: *FEVER GREATER THAN 100.4 F (38 C) OR HIGHER *CHILLS OR SWEATING *NAUSEA AND VOMITING THAT IS NOT CONTROLLED WITH YOUR NAUSEA MEDICATION *UNUSUAL SHORTNESS OF BREATH *UNUSUAL BRUISING OR BLEEDING *URINARY PROBLEMS (pain or burning when urinating, or frequent urination) *BOWEL PROBLEMS (unusual diarrhea, constipation, pain near the anus) TENDERNESS IN MOUTH AND THROAT WITH OR WITHOUT PRESENCE OF ULCERS (sore throat, sores in mouth, or a toothache) UNUSUAL RASH, SWELLING OR PAIN  UNUSUAL VAGINAL DISCHARGE OR ITCHING   Items with * indicate a potential emergency and should be followed up as soon as possible or go to the Emergency Department if any problems should occur.  Please show the CHEMOTHERAPY ALERT CARD or IMMUNOTHERAPY ALERT CARD  at check-in to the Emergency Department and triage nurse.  Should you have questions after your visit or need to cancel or reschedule your appointment, please contact [facility]  Dept: [phone]  and follow the prompts.  Office hours are 8:00 a.m. to 4:30 p.m. Monday - Friday. Please note that voicemails left after 4:00 p.m. may not be returned until the following business day.  We are closed weekends and major holidays. You have access to a nurse at all times for urgent questions. Please call the main number to the clinic Dept: [phone] and follow the prompts.   For any non-urgent questions, you may also contact your provider using MyChart. We now offer e-Visits for anyone 63 and older to request care online for non-urgent symptoms. For details visit [email or web address].   Also download the MyChart app! Go to the app store, search "MyChart", open the app, select [facility], and log in with your MyChart username and password.  Due to Covid, a mask is required upon entering the hospital/clinic. If you do not have a mask, one will be given to you upon arrival. For doctor visits, patients may have 1 support person aged 35 or older with them. For treatment visits, patients cannot have anyone with them due to current Covid guidelines and our immunocompromised population.   Rehydration, Adult Rehydration is the replacement of body fluids, salts, and minerals (electrolytes) that are lost during dehydration. Dehydration is when there is not enough water or other fluids in the body. This happens when you lose more fluids than you take in. Common causes of dehydration include: Not  drinking enough fluids. This can occur when you are ill or doing activities that require a lot of energy, especially in hot weather. Conditions that cause loss of water or other fluids, such as diarrhea, vomiting, sweating, or urinating a lot. Other illnesses, such as fever or  infection. Certain medicines, such as those that remove excess fluid from the body (diuretics). Symptoms of mild or moderate dehydration may include thirst, dry lips and mouth, and dizziness. Symptoms of severe dehydration may include increased heart rate, confusion, fainting, and not urinating. For severe dehydration, you may need to get fluids through an IV at the hospital. For mild or moderate dehydration, you can usually rehydrate at home by drinking certain fluids as told by your health care provider. What are the risks? Generally, rehydration is safe. However, taking in too much fluid (overhydration) can be a problem. This is rare. Overhydration can cause an electrolyte imbalance, kidney failure, or a decrease in salt (sodium) levels in the body. Supplies needed You will need an oral rehydration solution (ORS) if your health care provider tells you to use one. This is a drink to treat dehydration. It can be found in pharmacies and retail stores. How to rehydrate Fluids Follow instructions from your health care provider for rehydration. The kind of fluid and the amount you should drink depend on your condition. In general, you should choose drinks that you prefer. If told by your health care provider, drink an ORS. Make an ORS by following instructions on the package. Start by drinking small amounts, about  cup (120 mL) every 5-10 minutes. Slowly increase how much you drink until you have taken the amount recommended by your health care provider. Drink enough clear fluids to keep your urine pale yellow. If you were told to drink an ORS, finish it first, then start slowly drinking other clear fluids. Drink fluids such as: Water. This includes sparkling water and flavored water. Drinking only water can lead to having too little sodium in your body (hyponatremia). Follow the advice of your health care provider. Water from ice chips you suck on. Fruit juice with water you add to it  (diluted). Sports drinks. Hot or cold herbal teas. Broth-based soups. Milk or milk products. Food Follow instructions from your health care provider about what to eat while you rehydrate. Your health care provider may recommend that you slowly begin eating regular foods in small amounts. Eat foods that contain a healthy balance of electrolytes, such as bananas, oranges, potatoes, tomatoes, and spinach. Avoid foods that are greasy or contain a lot of sugar. In some cases, you may get nutrition through a feeding tube that is passed through your nose and into your stomach (nasogastric tube, or NG tube). This may be done if you have uncontrolled vomiting or diarrhea. Beverages to avoid  Certain beverages may make dehydration worse. While you rehydrate, avoid drinking alcohol. How to tell if you are recovering from dehydration You may be recovering from dehydration if: You are urinating more often than before you started rehydrating. Your urine is pale yellow. Your energy level improves. You vomit less frequently. You have diarrhea less frequently. Your appetite improves or returns to normal. You feel less dizzy or less light-headed. Your skin tone and color start to look more normal. Follow these instructions at home: Take over-the-counter and prescription medicines only as told by your health care provider. Do not take sodium tablets. Doing this can lead to having too much sodium in your body (hypernatremia). Contact a  health care provider if: You continue to have symptoms of mild or moderate dehydration, such as: Thirst. Dry lips. Slightly dry mouth. Dizziness. Dark urine or less urine than normal. Muscle cramps. You continue to vomit or have diarrhea. Get help right away if you: Have symptoms of dehydration that get worse. Have a fever. Have a severe headache. Have been vomiting and the following happens: Your vomiting gets worse or does not go away. Your vomit includes blood  or green matter (bile). You cannot eat or drink without vomiting. Have problems with urination or bowel movements, such as: Diarrhea that gets worse or does not go away. Blood in your stool (feces). This may cause stool to look black and tarry. Not urinating, or urinating only a small amount of very dark urine, within 6-8 hours. Have trouble breathing. Have symptoms that get worse with treatment. These symptoms may represent a serious problem that is an emergency. Do not wait to see if the symptoms will go away. Get medical help right away. Call your local emergency services (911 in the [place] Do not drive yourself to the hospital. Summary Rehydration is the replacement of body fluids and minerals (electrolytes) that are lost during dehydration. Follow instructions from your health care provider for rehydration. The kind of fluid and amount you should drink depend on your condition. Slowly increase how much you drink until you have taken the amount recommended by your health care provider. Contact your health care provider if you continue to show signs of mild or moderate dehydration. This information is not intended to replace advice given to you by your health care provider. Make sure you discuss any questions you have with your health care provider. Document Revised: 10/20/2019 Document Reviewed: 08/30/2019 Elsevier Patient Education  [place].

## 2022-02-20 NOTE — Progress Notes (Signed)
[facility]  Initial Assessment   Debra Kidd is a 61 y.o. year old female presenting alone. Clinical Social Work was referred by medical provider for assessment of psychosocial needs.   SDOH (Social Determinants of Health) assessments performed: Yes   SDOH Screenings   Alcohol Screen: Not on file  Depression (IRC7-8): Not on file (08/01/2017)  Financial Resource Strain: High Risk (03/21/2021)   Overall Financial Resource Strain (CARDIA)    Difficulty of Paying Living Expenses: Very hard  Food Insecurity: Food Insecurity Present (03/21/2021)   Hunger Vital Sign    Worried About Running Out of Food in the Last Year: Sometimes true    Ran Out of Food in the Last Year: Sometimes true  Housing: Medium Risk (03/21/2021)   Housing    Last Housing Risk Score: 1  Physical Activity: Not on file  Social Connections: Moderately Isolated (03/21/2021)   Social Connection and Isolation Panel [NHANES]    Frequency of Communication with Friends and Family: More than three times a week    Frequency of Social Gatherings with Friends and Family: More than three times a week    Attends Religious Services: Never    Marine scientist or Organizations: No    Attends Archivist Meetings: Never    Marital Status: Married  Stress: Stress Concern Present (03/21/2021)   [employer]    Feeling of Stress : To some extent  Tobacco Use: High Risk (02/04/2022)   Patient History    Smoking Tobacco Use: Every Day    Smokeless Tobacco Use: Never    Passive Exposure: Not on file  Transportation Needs: No Transportation Needs (03/21/2021)   PRAPARE - Transportation    Lack of Transportation (Medical): No    Lack of Transportation (Non-Medical): No     Distress Screen completed: No    11/04/2016    3:59 PM  ONCBCN DISTRESS SCREENING  Screening Type Initial Screening  Distress experienced in past week (1-10) 4   Emotional problem type Nervousness/Anxiety;Boredom  Physical Problem type Sleep/insomnia;Skin dry/itchy      Family/Social Information:  Housing Arrangement: patient lives with her spouse and son.   Pt's son is unable to work due to back and hip issues.  Son is currently applying for disability. Family members/support persons in your life? Family Transportation concerns: no  Employment: Disabled due to metastatic lung cancer.  Income source: Social Security Disability Financial concerns: Yes, current concerns Type of concern:  Pt reports financial concerns w/ regards to obtaining medication, but also states she has a fixed income and does struggle monthly with bills.  Pt is hopeful her son will be approved for disability as he could then contribute to the household financially. Food access concerns: no Religious or spiritual practice: Not known Services Currently in place:  none  Coping/ Adjustment to diagnosis: Patient understands treatment plan and what happens next? yes Concerns about diagnosis and/or treatment: How I will pay for the services I need Patient reported stressors: Finances Hopes and/or priorities: Pt's priority is to continue treatment w/ the hope of achieving medical stability for some period of time.   Patient enjoys  not discussed Current coping skills/ strengths: Capable of independent living , pt does use a walker for ambulation following a stroke 2 years ago but does retain independence in ADLs    SUMMARY: Current SDOH Barriers:  Financial constraints related to fixed income and Medication procurement  Clinical Social Work  Clinical Goal(s):  Pt to work w/ CSW and Arboriculturist regarding financial concerns  Interventions: Discussed common feeling and emotions when being diagnosed with cancer, and the importance of support during treatment Informed patient of the support team roles and support services at [facility] Provided CSW contact  information and encouraged patient to call with any questions or concerns Referred patient to financial resource specialist and completed an application for [employer] to register pt for their gas card program.  CSW will re-apply pt every 4 months as eligible.   Follow Up Plan: Patient will contact CSW with any support or resource needs Patient verbalizes understanding of plan: Yes    Henriette Combs, LCSW

## 2022-02-20 NOTE — Progress Notes (Signed)
Ok to treat with blood pressure of 89/60 per Cassie Hellingoetter, PA. IVF to be given with treatment.

## 2022-02-26 ENCOUNTER — Encounter: Payer: Self-pay | Admitting: Licensed Clinical Social Worker

## 2022-02-26 DIAGNOSIS — C3491 Malignant neoplasm of unspecified part of right bronchus or lung: Secondary | ICD-10-CM

## 2022-02-26 NOTE — Progress Notes (Signed)
[facility] CSW Progress Note  Clinical Child psychotherapist contacted patient by phone to inform she has been approved for the [facility] gas card and should anticipate receiving it in the mail soon.  Pt to be reapplied for another gas card in 4 months when she is eligible again.  CSW to continue to follow throughout duration of treatment as appropriate.      Rachel Moulds, LCSW

## 2022-02-27 ENCOUNTER — Telehealth: Payer: Self-pay | Admitting: Internal Medicine

## 2022-02-27 NOTE — Telephone Encounter (Signed)
Scheduled per 06/21 los, patient has been called and notified.

## 2022-03-12 ENCOUNTER — Inpatient Hospital Stay: Payer: Medicare Other | Admitting: Dietician

## 2022-03-12 ENCOUNTER — Other Ambulatory Visit: Payer: Self-pay

## 2022-03-12 ENCOUNTER — Inpatient Hospital Stay: Payer: Medicare Other

## 2022-03-12 ENCOUNTER — Inpatient Hospital Stay: Payer: Medicare Other | Attending: Internal Medicine

## 2022-03-12 ENCOUNTER — Inpatient Hospital Stay (HOSPITAL_BASED_OUTPATIENT_CLINIC_OR_DEPARTMENT_OTHER): Payer: Medicare Other | Admitting: Internal Medicine

## 2022-03-12 VITALS — BP 103/77 | HR 80 | Resp 17

## 2022-03-12 VITALS — BP 83/65 | HR 82 | Temp 97.3°F | Resp 17 | Wt 98.1 lb

## 2022-03-12 DIAGNOSIS — C7889 Secondary malignant neoplasm of other digestive organs: Secondary | ICD-10-CM | POA: Insufficient documentation

## 2022-03-12 DIAGNOSIS — Z5112 Encounter for antineoplastic immunotherapy: Secondary | ICD-10-CM | POA: Diagnosis not present

## 2022-03-12 DIAGNOSIS — Z79899 Other long term (current) drug therapy: Secondary | ICD-10-CM | POA: Diagnosis not present

## 2022-03-12 DIAGNOSIS — C3411 Malignant neoplasm of upper lobe, right bronchus or lung: Secondary | ICD-10-CM | POA: Diagnosis present

## 2022-03-12 DIAGNOSIS — M810 Age-related osteoporosis without current pathological fracture: Secondary | ICD-10-CM | POA: Diagnosis not present

## 2022-03-12 DIAGNOSIS — I10 Essential (primary) hypertension: Secondary | ICD-10-CM | POA: Insufficient documentation

## 2022-03-12 DIAGNOSIS — Z5111 Encounter for antineoplastic chemotherapy: Secondary | ICD-10-CM | POA: Diagnosis not present

## 2022-03-12 DIAGNOSIS — E785 Hyperlipidemia, unspecified: Secondary | ICD-10-CM | POA: Diagnosis not present

## 2022-03-12 DIAGNOSIS — C3491 Malignant neoplasm of unspecified part of right bronchus or lung: Secondary | ICD-10-CM | POA: Diagnosis not present

## 2022-03-12 DIAGNOSIS — R5383 Other fatigue: Secondary | ICD-10-CM

## 2022-03-12 LAB — CBC WITH DIFFERENTIAL (CANCER CENTER ONLY)
Abs Immature Granulocytes: 0.02 10*3/uL (ref 0.00–0.07)
Basophils Absolute: 0 10*3/uL (ref 0.0–0.1)
Basophils Relative: 1 %
Eosinophils Absolute: 0.3 10*3/uL (ref 0.0–0.5)
Eosinophils Relative: 4 %
HCT: 33.7 % — ABNORMAL LOW (ref 36.0–46.0)
Hemoglobin: 11.7 g/dL — ABNORMAL LOW (ref 12.0–15.0)
Immature Granulocytes: 0 %
Lymphocytes Relative: 24 %
Lymphs Abs: 1.9 10*3/uL (ref 0.7–4.0)
MCH: 36.3 pg — ABNORMAL HIGH (ref 26.0–34.0)
MCHC: 34.7 g/dL (ref 30.0–36.0)
MCV: 104.7 fL — ABNORMAL HIGH (ref 80.0–100.0)
Monocytes Absolute: 0.5 10*3/uL (ref 0.1–1.0)
Monocytes Relative: 6 %
Neutro Abs: 5 10*3/uL (ref 1.7–7.7)
Neutrophils Relative %: 65 %
Platelet Count: 272 10*3/uL (ref 150–400)
RBC: 3.22 MIL/uL — ABNORMAL LOW (ref 3.87–5.11)
RDW: 13 % (ref 11.5–15.5)
WBC Count: 7.8 10*3/uL (ref 4.0–10.5)
nRBC: 0 % (ref 0.0–0.2)

## 2022-03-12 LAB — CMP (CANCER CENTER ONLY)
ALT: 11 U/L (ref 0–44)
AST: 16 U/L (ref 15–41)
Albumin: 3.3 g/dL — ABNORMAL LOW (ref 3.5–5.0)
Alkaline Phosphatase: 137 U/L — ABNORMAL HIGH (ref 38–126)
Anion gap: 6 (ref 5–15)
BUN: <5 mg/dL — ABNORMAL LOW (ref 8–23)
CO2: 29 mmol/L (ref 22–32)
Calcium: 8.6 mg/dL — ABNORMAL LOW (ref 8.9–10.3)
Chloride: 103 mmol/L (ref 98–111)
Creatinine: 0.67 mg/dL (ref 0.44–1.00)
GFR, Estimated: >60 mL/min (ref >60)
Glucose, Bld: 111 mg/dL — ABNORMAL HIGH (ref 70–99)
Potassium: 3.1 mmol/L — ABNORMAL LOW (ref 3.5–5.1)
Sodium: 138 mmol/L (ref 135–145)
Total Bilirubin: 0.4 mg/dL (ref 0.3–1.2)
Total Protein: 6 g/dL — ABNORMAL LOW (ref 6.5–8.1)

## 2022-03-12 LAB — TSH: TSH: 2.486 u[IU]/mL (ref 0.350–4.500)

## 2022-03-12 MED ORDER — SODIUM CHLORIDE 0.9% FLUSH
10.0000 mL | INTRAVENOUS | Status: DC | PRN
  Administered 2022-03-12: 10 mL

## 2022-03-12 MED ORDER — HEPARIN SOD (PORK) LOCK FLUSH 100 UNIT/ML IV SOLN
500.0000 [IU] | Freq: Once | INTRAVENOUS | Status: AC | PRN
  Administered 2022-03-12: 500 [IU]

## 2022-03-12 MED ORDER — SODIUM CHLORIDE 0.9 % IV SOLN: INTRAVENOUS | Status: AC

## 2022-03-12 MED ORDER — SODIUM CHLORIDE 0.9 % IV SOLN: Freq: Once | INTRAVENOUS | Status: AC

## 2022-03-12 MED ORDER — SODIUM CHLORIDE 0.9 % IV SOLN
1200.0000 mg | Freq: Once | INTRAVENOUS | Status: AC
  Administered 2022-03-12: 1200 mg via INTRAVENOUS
  Filled 2022-03-12: qty 20

## 2022-03-12 NOTE — Patient Instructions (Addendum)
[facility]  Discharge Instructions: Thank you for choosing [facility] to provide your oncology and hematology care.   If you have a lab appointment with the [facility], please go directly to the [facility] and check in at the registration area.   Wear comfortable clothing and clothing appropriate for easy access to any Portacath or PICC line.   We strive to give you quality time with your provider. You may need to reschedule your appointment if you arrive late (15 or more minutes).  Arriving late affects you and other patients whose appointments are after yours.  Also, if you miss three or more appointments without notifying the office, you may be dismissed from the clinic at the provider's discretion.      For prescription refill requests, have your pharmacy contact our office and allow 72 hours for refills to be completed.    Today you received the following chemotherapy and/or immunotherapy agents: atezolizumab      To help prevent nausea and vomiting after your treatment, we encourage you to take your nausea medication as directed.  BELOW ARE SYMPTOMS THAT SHOULD BE REPORTED IMMEDIATELY: *FEVER GREATER THAN 100.4 F (38 C) OR HIGHER *CHILLS OR SWEATING *NAUSEA AND VOMITING THAT IS NOT CONTROLLED WITH YOUR NAUSEA MEDICATION *UNUSUAL SHORTNESS OF BREATH *UNUSUAL BRUISING OR BLEEDING *URINARY PROBLEMS (pain or burning when urinating, or frequent urination) *BOWEL PROBLEMS (unusual diarrhea, constipation, pain near the anus) TENDERNESS IN MOUTH AND THROAT WITH OR WITHOUT PRESENCE OF ULCERS (sore throat, sores in mouth, or a toothache) UNUSUAL RASH, SWELLING OR PAIN  UNUSUAL VAGINAL DISCHARGE OR ITCHING   Items with * indicate a potential emergency and should be followed up as soon as possible or go to the Emergency Department if any problems should occur.  Please show the CHEMOTHERAPY ALERT CARD or IMMUNOTHERAPY ALERT CARD at check-in  to the Emergency Department and triage nurse.  Should you have questions after your visit or need to cancel or reschedule your appointment, please contact [facility]  Dept: [phone]  and follow the prompts.  Office hours are 8:00 a.m. to 4:30 p.m. Monday - Friday. Please note that voicemails left after 4:00 p.m. may not be returned until the following business day.  We are closed weekends and major holidays. You have access to a nurse at all times for urgent questions. Please call the main number to the clinic Dept: [phone] and follow the prompts.   For any non-urgent questions, you may also contact your provider using MyChart. We now offer e-Visits for anyone 42 and older to request care online for non-urgent symptoms. For details visit [email or web address].   Also download the MyChart app! Go to the app store, search "MyChart", open the app, select [facility], and log in with your MyChart username and password.  Masks are optional in the cancer centers. If you would like for your care team to wear a mask while they are taking care of you, please let them know. For doctor visits, patients may have with them one support person who is at least 63 years old. At this time, visitors are not allowed in the infusion area.  Dehydration, Adult Dehydration is a condition in which there is not enough water or other fluids in the body. This happens when a person loses more fluids than he or she takes in. Important organs, such as the kidneys, brain, and heart, cannot function without a proper amount of fluids.  Any loss of fluids from the body can lead to dehydration. Dehydration can be mild, moderate, or severe. It should be treated right away to prevent it from becoming severe. What are the causes? Dehydration may be caused by: Conditions that cause loss of water or other fluids, such as diarrhea, vomiting, or sweating or urinating a lot. Not drinking enough fluids,  especially when you are ill or doing activities that require a lot of energy. Other illnesses and conditions, such as fever or infection. Certain medicines, such as medicines that remove excess fluid from the body (diuretics). Lack of safe drinking water. Not being able to get enough water and food. What increases the risk? The following factors may make you more likely to develop this condition: Having a long-term (chronic) illness that has not been treated properly, such as diabetes, heart disease, or kidney disease. Being 63 years of age or older. Having a disability. Living in a place that is high in altitude, where thinner, drier air causes more fluid loss. Doing exercises that put stress on your body for a long time (endurance sports). What are the signs or symptoms? Symptoms of dehydration depend on how severe it is. Mild or moderate dehydration Thirst. Dry lips or dry mouth. Dizziness or light-headedness, especially when standing up from a seated position. Muscle cramps. Dark urine. Urine may be the color of tea. Less urine or tears produced than usual. Headache. Severe dehydration Changes in skin. Your skin may be cold and clammy, blotchy, or pale. Your skin also may not return to normal after being lightly pinched and released. Little or no tears, urine, or sweat. Changes in vital signs, such as rapid breathing and low blood pressure. Your pulse may be weak or may be faster than 100 beats a minute when you are sitting still. Other changes, such as: Feeling very thirsty. Sunken eyes. Cold hands and feet. Confusion. Being very tired (lethargic) or having trouble waking from sleep. Short-term weight loss. Loss of consciousness. How is this diagnosed? This condition is diagnosed based on your symptoms and a physical exam. You may have blood and urine tests to help confirm the diagnosis. How is this treated? Treatment for this condition depends on how severe it is. Treatment  should be started right away. Do not wait until dehydration becomes severe. Severe dehydration is an emergency and needs to be treated in a hospital. Mild or moderate dehydration can be treated at home. You may be asked to: Drink more fluids. Drink an oral rehydration solution (ORS). This drink helps restore proper amounts of fluids and salts and minerals in the blood (electrolytes). Severe dehydration can be treated: With IV fluids. By correcting abnormal levels of electrolytes. This is often done by giving electrolytes through a tube that is passed through your nose and into your stomach (nasogastric tube, or NG tube). By treating the underlying cause of dehydration. Follow these instructions at home: Oral rehydration solution If told by your health care provider, drink an ORS: Make an ORS by following instructions on the package. Start by drinking small amounts, about  cup (120 mL) every 5-10 minutes. Slowly increase how much you drink until you have taken the amount recommended by your health care provider. Eating and drinking        Drink enough clear fluid to keep your urine pale yellow. If you were told to drink an ORS, finish the ORS first and then start slowly drinking other clear fluids. Drink fluids such as: Water. Do not  drink only water. Doing that can lead to hyponatremia, which is having too little salt (sodium) in the body. Water from ice chips you suck on. Fruit juice that you have added water to (diluted fruit juice). Low-calorie sports drinks. Eat foods that contain a healthy balance of electrolytes, such as bananas, oranges, potatoes, tomatoes, and spinach. Do not drink alcohol. Avoid the following: Drinks that contain a lot of sugar. These include high-calorie sports drinks, fruit juice that is not diluted, and soda. Caffeine. Foods that are greasy or contain a lot of fat or sugar. General instructions Take over-the-counter and prescription medicines only as told  by your health care provider. Do not take sodium tablets. Doing that can lead to having too much sodium in the body (hypernatremia). Return to your normal activities as told by your health care provider. Ask your health care provider what activities are safe for you. Keep all follow-up visits as told by your health care provider. This is important. Contact a health care provider if: You have muscle cramps, pain, or discomfort, such as: Pain in your abdomen and the pain gets worse or stays in one area (localizes). Stiff neck. You have a rash. You are more irritable than usual. You are sleepier or have a harder time waking than usual. You feel weak or dizzy. You feel very thirsty. Get help right away if you have: Any symptoms of severe dehydration. Symptoms of vomiting, such as: You cannot eat or drink without vomiting. Vomiting gets worse or does not go away. Vomit includes blood or green matter (bile). Symptoms that get worse with treatment. A fever. A severe headache. Problems with urination or bowel movements, such as: Diarrhea that gets worse or does not go away. Blood in your stool (feces). This may cause stool to look black and tarry. Not urinating, or urinating only a small amount of very dark urine, within 6-8 hours. Trouble breathing. These symptoms may represent a serious problem that is an emergency. Do not wait to see if the symptoms will go away. Get medical help right away. Call your local emergency services (911 in the [place] Do not drive yourself to the hospital. Summary Dehydration is a condition in which there is not enough water or other fluids in the body. This happens when a person loses more fluids than he or she takes in. Treatment for this condition depends on how severe it is. Treatment should be started right away. Do not wait until dehydration becomes severe. Drink enough clear fluid to keep your urine pale yellow. If you were told to drink an oral rehydration  solution (ORS), finish the ORS first and then start slowly drinking other clear fluids. Take over-the-counter and prescription medicines only as told by your health care provider. Get help right away if you have any symptoms of severe dehydration. This information is not intended to replace advice given to you by your health care provider. Make sure you discuss any questions you have with your health care provider. Document Revised: 04/01/2019 Document Reviewed: 04/01/2019 Elsevier Patient Education  [place].

## 2022-03-12 NOTE — Progress Notes (Signed)
Nutrition Follow-up:  Patient with extensive stage small cell lung cancer. She is currently receiving maintenance treatment with single agent Tecentriq 1200 mg IV every 3 weeks.   Met with patient in infusion. She reports eating more over the last couple of weeks. Patient recalls snacking on yogurt, peanut butter crackers, watermelon, peaches, ice cream in between meals. She is drinking quart of buttermilk every 3 days. Patient continues to drink CIB as well as one Ensure. Says Ensure are thick. She is has been drinking V8 juice as well as 6-8 Pepsi everyday. Patient reports trying Pedialyte hydration as well as Unjury protein samples provided to her at last visit. She did not like these. Patient denies nausea, vomiting, constipation. She has diarrhea with oral potassium. Patient reports taking supplement every few days due to this.   Medications: reviewed   Labs: K 3.1, glucose 111, BUN <5  Anthropometrics: Weight 98 lb 2 oz today decreased   6/21 - 100 lb 9.6 oz 5/31 - 102 lb 11.2 oz 5/10 - 104 lb 8 oz  4/19 - 104 lb 14.4 oz   NUTRITION DIAGNOSIS: Unintended weight loss continues    INTERVENTION:  Continue eating high calorie high protein foods Suggested pouring Ensure over ice or cutting with whole milk to thin supplement, she is agreeable to trying this Recommend 2 Ensure Plus + one CIB with whole milk daily One complementary case of Ensure Plus High protein provided     MONITORING, EVALUATION, GOAL: weight trends, intake    NEXT VISIT: Wednesday August 2 during infusion

## 2022-03-12 NOTE — Progress Notes (Signed)
[facility] Telephone:([phone]) [phone]   Fax:(336) [phone]  OFFICE PROGRESS NOTE  Pcp, [name], N3, [name], large right anterior mediastinal and supraclavicular lymphadenopathy as well as pancreatic and splenic metastasis diagnosed in September 2017.  The patient had disease progression in October 2019.  PRIOR THERAPY:  1) Systemic chemotherapy was carboplatin for AUC of 5 on day 1 and etoposide 100 MG/M2 on days 1, 2 and 3 with Neulasta support. Status post 6 cycles with significant response of her disease. 2) Prophylactic cranial irradiation under the care of Dr. Sondra Come on 12/02/2016. 3) stereotactic radiotherapy to the recurrent right upper lobe pulmonary nodule under the care of Dr. Sondra Come completed November 10, 2017. 4) Retreatment with systemic chemotherapy with carboplatin for AUC of 5 on day 1 and etoposide 100 mg/M2 on days 1, 2 and 3 as well as Tecentriq (Atezolizumab) 1200 mg IV every 3 weeks with Neulasta support.  First dose June 22, 2018 for disease recurrence.  Status post 5 cycles.  Starting from cycle #2 her dose of carboplatin will be reduced to AUC of 4 and etoposide 80 mg/M2 on days 1, 2 and 3 in addition to the regular dose of Tecentriq.  CURRENT THERAPY: Maintenance treatment with single agent Tecentriq 1200 mg IV every 3 weeks.  Status post 58 cycles.  INTERVAL HISTORY: Debra Kidd 61 y.o. female returns to the clinic today for follow-up visit.  The patient is feeling fine today with no concerning complaints except for the generalized fatigue and weakness in addition to occasional dizzy spells.  She has no chest pain, shortness of breath, cough or hemoptysis.  She denied having any fever or chills.  She has no nausea, vomiting, diarrhea or constipation.  She has been tolerating her treatment with Tecentriq fairly well.  She is here today for  evaluation before starting cycle #64  MEDICAL HISTORY: Past Medical History:  Diagnosis Date   Basal cell carcinoma of cheek    L side of face   Blood type, Rh positive    Cancer ([place])    Chronic pain syndrome    Depression 08/07/2016   Encounter for antineoplastic chemotherapy 07/17/2016   History of external beam radiation therapy 11/19/16-12/02/16   brain 25 Gy in 10 fractions   Hyperlipidemia    Hypertension    Hypertension 08/07/2016   Osteoporosis    Small cell lung cancer ([place]) dx'd 05/2016    ALLERGIES:  is allergic to codeine, motrin [ibuprofen], thiazide-type diuretics, and vicodin [hydrocodone-acetaminophen].  MEDICATIONS:  Current Outpatient Medications  Medication Sig Dispense Refill   atorvastatin (LIPITOR) 80 MG tablet Take 1 tablet (80 mg total) by mouth daily. 30 tablet 11   clopidogrel (PLAVIX) 75 MG tablet Take 1 tablet (75 mg total) by mouth daily. 90 tablet 3   diazepam (VALIUM) 5 MG tablet Take 5 mg by mouth 3 (three) times daily as needed for anxiety.   2   lidocaine-prilocaine (EMLA) cream Apply 1 application. topically as needed. 30 g 2   potassium chloride 20 MEQ/15ML (10%) SOLN Take 15 mLs (20 mEq total) by mouth daily. 100 mL 0   traMADol (ULTRAM) 50 MG tablet Take 1 tablet (50 mg total) by mouth 4 (four) times daily as needed for moderate pain (confirmed with her Pharmacy-Dr K. Little 03/15 she received #120). 30 tablet 0   UNABLE TO FIND Med  Name: "cholesterol medication"     No current facility-administered medications for this visit.    SURGICAL HISTORY:  Past Surgical History:  Procedure Laterality Date   ABDOMINAL HYSTERECTOMY  1981   APPENDECTOMY     at age 31   EYE SURGERY     left   IR GENERIC HISTORICAL  06/03/2016   IR FLUORO GUIDE PORT INSERTION RIGHT 06/03/2016 [facility]INTERV RAD   IR GENERIC HISTORICAL  06/03/2016   IR US GUIDE VASC ACCESS RIGHT 06/03/2016 [facility]   SKIN CANCER EXCISION     basal cell carcinoma L side of face     REVIEW OF SYSTEMS:  A comprehensive review of systems was negative except for: Constitutional: positive for fatigue Neurological: positive for dizziness   PHYSICAL EXAMINATION: General appearance: alert, cooperative, fatigued, and no distress Head: Normocephalic, without obvious abnormality, atraumatic Neck: no adenopathy, no JVD, supple, symmetrical, trachea midline, and thyroid not enlarged, symmetric, no tenderness/mass/nodules Lymph nodes: Cervical, supraclavicular, and axillary nodes normal. Resp: clear to auscultation bilaterally Back: symmetric, no curvature. ROM normal. No CVA tenderness. Cardio: regular rate and rhythm, S1, S2 normal, no murmur, click, rub or gallop GI: soft, non-tender; bowel sounds normal; no masses,  no organomegaly Extremities: extremities normal, atraumatic, no cyanosis or edema  ECOG PERFORMANCE STATUS: 1 - Symptomatic but completely ambulatory  Blood pressure (!) 83/65, pulse 82, temperature (!) 97.3 F (36.3 C), temperature source Tympanic, resp. rate 17, weight 98 lb 2 oz (44.5 kg), SpO2 100 %.  LABORATORY DATA: Lab Results  Component Value Date   WBC 7.8 03/12/2022   HGB 11.7 (L) 03/12/2022   HCT 33.7 (L) 03/12/2022   MCV 104.7 (H) 03/12/2022   PLT 272 03/12/2022      Chemistry      Component Value Date/Time   NA 139 02/20/2022 1013   NA 141 08/01/2017 0835   K 3.2 (L) 02/20/2022 1013   K 3.5 08/01/2017 0835   CL 104 02/20/2022 1013   CO2 31 02/20/2022 1013   CO2 25 08/01/2017 0835   BUN <5 (L) 02/20/2022 1013   BUN 8.0 08/01/2017 0835   CREATININE 0.65 02/20/2022 1013   CREATININE 0.9 08/01/2017 0835      Component Value Date/Time   CALCIUM 8.7 (L) 02/20/2022 1013   CALCIUM 9.9 08/01/2017 0835   ALKPHOS 135 (H) 02/20/2022 1013   ALKPHOS 142 08/01/2017 0835   AST 21 02/20/2022 1013   AST 15 08/01/2017 0835   ALT 13 02/20/2022 1013   ALT 19 08/01/2017 0835   BILITOT 0.4 02/20/2022 1013   BILITOT 0.35 08/01/2017 0835        RADIOGRAPHIC STUDIES: No results found.  ASSESSMENT AND PLAN:  This is a very pleasant 61 years old white female with extensive stage small cell lung cancer status post systemic chemotherapy with carbo platinum and etoposide for 6 cycles and the patient rotated her treatment well except for chemotherapy-induced anemia and requirement for PRBCs and platelet transfusion. She had significant improvement in her disease with the chemotherapy. She also had prophylactic cranial irradiation. She also underwent stereotactic radiotherapy to progressive right upper lobe pulmonary nodule. The patient has been in observation for close to 2 years. Repeat CT scan of the chest, abdomen and pelvis performed recently showed evidence for disease progression in the abdomen. The patient started on systemic chemotherapy again with carboplatin, etoposide and Tecentriq status post 63 cycles.  Starting from cycle #5 the patient is treated with maintenance single agent Tecentriq. The patient  has been tolerating this treatment well with no concerning adverse effects. I recommended for her to proceed with cycle #64 today as planned. For the hypotension, I will arrange for the patient to receive IV hydration with normal saline in the clinic and she was encouraged to increase her oral intake.  She usually does not eat breakfast or lunch. I encouraged her to eat her 3 meals. For the history of stroke, she is followed by neurology and she is currently on Plavix and aspirin. I will see her back for follow-up visit in 3 weeks for evaluation before the next cycle of her treatment. The patient was advised to call immediately if she has any other concerning symptoms in the interval.   All questions were answered. The patient knows to call the clinic with any problems, questions or concerns. We can certainly see the patient much sooner if necessary.  Disclaimer: This note was dictated with voice recognition software. Similar  sounding words can inadvertently be transcribed and may not be corrected upon review.

## 2022-03-12 NOTE — Progress Notes (Signed)
OK to treat per Dr. Julien Nordmann with B/P of 83/65. 500 cc fluids ordered and patient to eat and drink while in infusion.

## 2022-03-25 ENCOUNTER — Other Ambulatory Visit: Payer: Self-pay

## 2022-03-28 NOTE — Progress Notes (Signed)
Debra Kidd OFFICE PROGRESS NOTE  Pcp, No No address on file  DIAGNOSIS:  Extensive stage (T2a, N3, [name], large right anterior mediastinal and supraclavicular lymphadenopathy as well as pancreatic and splenic metastasis diagnosed in September 2017.  The patient had disease progression in October 2019.   PRIOR THERAPY: ) Systemic chemotherapy was carboplatin for AUC of 5 on day 1 and etoposide 100 MG/M2 on days 1, 2 and 3 with Neulasta support. Status post 6 cycles with significant response of her disease. 2) Prophylactic cranial irradiation under the care of Dr. Sondra Come on 12/02/2016. 3) stereotactic radiotherapy to the recurrent right upper lobe pulmonary nodule under the care of Dr. Sondra Come completed November 10, 2017. 4) Retreatment with systemic chemotherapy with carboplatin for AUC of 5 on day 1 and etoposide 100 mg/M2 on days 1, 2 and 3 as well as Tecentriq (Atezolizumab) 1200 mg IV every 3 weeks with Neulasta support.  First dose June 22, 2018 for disease recurrence.  Status post 5 cycles.  Starting from cycle #2 her dose of carboplatin will be reduced to AUC of 4 and etoposide 80 mg/M2 on days 1, 2 and 3 in addition to the regular dose of Tecentriq.  CURRENT THERAPY:  Maintenance treatment with single agent Tecentriq 1200 mg IV every 3 weeks.  Status post 64 cycles.  INTERVAL HISTORY: Debra Kidd 61 y.o. female returns to the clinic today for a follow-up visit. The patient is feeling fair today without any concerning complaints except for persistent fatigue and generalized weakness.  She is followed closely by social work and nutrition.  The patient is not very active and does not eat or drink a well-balanced diet.  She often receives complementary food items and supplemental protein drinks in our clinic to take home. She reports she has not been drinking them due to not liking the consistency of being too thick.  The patient has been having some ongoing dizziness for which she often requires IV fluids.  She is followed closely by neurology for history of stroke and has had several brain MRIs to rule out metastatic disease to the brain which has fortunately been negative thus far.  The patient has been on several medications that can cause drowsiness such as Atarax, Benadryl, and pain medication.  She was previously instructed to discontinue taking these.  She mentions today that she establish care with a new PCP.  She was previously being prescribed tramadol or oxycodone for pain.  From reviewing PMP aware it looks like the patient was recently prescribed tramadol 120 tablets on 03/25/2022.  The patient was requesting oxycodone for her back pain.  She states that she does not take tramadol and oxycodone together.  Otherwise she denies any new concerns today.  The patient is currently undergoing treatment with Tecentriq.  He tolerates this well except for dry skin and itching which is tolerable.  She denies any fever, chills, or night sweats.   She denies any nausea, vomiting, or constipation.  She sometimes has intermittent diarrhea.  She does not take Imodium.  She has frequent hypokalemia on labs likely because of this.  She states she has potassium supplements at home.  She reports her baseline dyspnea on exertion which is stable without any chest pain or hemoptysis.  She reports a baseline mild cough and she continues to smoke approximately 2 packs of cigarettes per day.  She is here today for evaluation and repeat  blood work before undergoing cycle #65.     MEDICAL HISTORY: Past Medical History:  Diagnosis Date   Basal cell carcinoma of cheek    L side of face   Blood type, Rh positive    Cancer (HCC)    Chronic pain syndrome    Depression 08/07/2016   Encounter for antineoplastic chemotherapy 07/17/2016   History of external beam radiation therapy 11/19/16-12/02/16   brain 25 Gy in 10 fractions    Hyperlipidemia    Hypertension    Hypertension 08/07/2016   Osteoporosis    Small cell lung cancer ([facility]) dx'd 05/2016    ALLERGIES:  is allergic to codeine, motrin [ibuprofen], thiazide-type diuretics, and vicodin [hydrocodone-acetaminophen].  MEDICATIONS:  Current Outpatient Medications  Medication Sig Dispense Refill   atorvastatin (LIPITOR) 80 MG tablet Take 1 tablet (80 mg total) by mouth daily. 30 tablet 11   clopidogrel (PLAVIX) 75 MG tablet Take 1 tablet (75 mg total) by mouth daily. 90 tablet 3   diazepam (VALIUM) 5 MG tablet Take 5 mg by mouth 3 (three) times daily as needed for anxiety.   2   lidocaine-prilocaine (EMLA) cream Apply 1 application. topically as needed. 30 g 2   potassium chloride 20 MEQ/15ML (10%) SOLN Take 15 mLs (20 mEq total) by mouth daily. 100 mL 0   traMADol (ULTRAM) 50 MG tablet Take 1 tablet (50 mg total) by mouth 4 (four) times daily as needed for moderate pain (confirmed with her Pharmacy-Dr K. Little 03/15 she received #120). 30 tablet 0   UNABLE TO FIND Med Name: "cholesterol medication"     No current facility-administered medications for this visit.    SURGICAL HISTORY:  Past Surgical History:  Procedure Laterality Date   ABDOMINAL HYSTERECTOMY  1981   APPENDECTOMY     at age 39   EYE SURGERY     left   IR GENERIC HISTORICAL  06/03/2016   IR FLUORO GUIDE PORT INSERTION RIGHT 06/03/2016 [facility]   IR GENERIC HISTORICAL  06/03/2016   IR US GUIDE VASC ACCESS RIGHT 06/03/2016 [facility]   SKIN CANCER EXCISION     basal cell carcinoma L side of face    REVIEW OF SYSTEMS:   Review of Systems  Constitutional: Positive for decreased appetite and fatigue. Negative for  chills, fever and unexpected weight change.  HENT: Negative for mouth sores, nosebleeds, sore throat and trouble swallowing.   Eyes: Negative for eye problems and icterus.  Respiratory: Positive for SOB and mild cough which are her baseline. Negative for hemoptysis, and  wheezing.   Cardiovascular: Negative for chest pain and leg swelling.  Gastrointestinal: Positive for chronic diarrhea.  Negative for abdominal pain, constipation,  nausea and vomiting.  Genitourinary: Negative for bladder incontinence, difficulty urinating, dysuria, frequency and hematuria.   Musculoskeletal: Positive for chronic back pain. Negative for gait problem, neck pain and neck stiffness.  Skin: Negative for itching and rash.  Neurological: Positive for generalized weakness.  Positive for left-sided weakness due to her history of stroke.  Negative for extremity weakness, gait problem, headaches, [name], depression and sleep disturbance. The patient is not nervous/anxious.    PHYSICAL EXAMINATION:  Blood pressure 97/79, pulse 85, temperature 98.1 F (36.7 C), temperature source Oral, resp. rate 12, height 5\' 3"  (1.6 m), weight 97 lb 3.2 oz (44.1 kg), SpO2 100 %.  ECOG PERFORMANCE STATUS: 2-3  Physical Exam  Constitutional:  Oriented to person, place, and time and chronically ill appearing female appears older than stated age and in no distress. HENT: Head: Normocephalic and atraumatic. Mouth/Throat: Oropharynx is clear and moist. No oropharyngeal exudate. Eyes: Conjunctivae are normal. Right eye exhibits no discharge. Left eye exhibits no discharge. No scleral icterus. Neck: Normal range of motion. Neck supple. Cardiovascular: Normal rate, regular rhythm, normal heart sounds and intact distal pulses.   Pulmonary/Chest: Effort normal.  Quiet breath sounds in all lung fields.  No respiratory distress. No wheezes. No rales. Abdominal: Soft. Bowel sounds are normal. Exhibits no distension and no mass. There is no tenderness.  Musculoskeletal: Normal range of motion. Exhibits no edema.  Lymphadenopathy:    No cervical adenopathy.  Neurological:  Alert and oriented to person, place, and time. Exhibits muscle wasting. Examined in the wheelchair. Chronic left sided weakness due to hx of CVA.  Skin: Skin is warm and dry. No rash noted. Not diaphoretic. No erythema. No pallor.  Psychiatric: Mood, memory and judgment normal. Vitals reviewed.  LABORATORY DATA: Lab Results  Component Value Date   WBC 6.6 04/03/2022   HGB 11.5 (L) 04/03/2022   HCT 33.4 (L) 04/03/2022   MCV 105.4 (H) 04/03/2022   PLT 287 04/03/2022      Chemistry      Component Value Date/Time   NA 139 04/03/2022 1053   NA 141 08/01/2017 0835   K 3.3 (L) 04/03/2022 1053   K 3.5 08/01/2017 0835   CL 104 04/03/2022 1053   CO2 29 04/03/2022 1053   CO2 25 08/01/2017 0835   BUN 5 (L) 04/03/2022 1053   BUN 8.0 08/01/2017 0835   CREATININE 0.55 04/03/2022 1053   CREATININE 0.9 08/01/2017 0835      Component Value Date/Time   CALCIUM 8.7 (L) 04/03/2022 1053   CALCIUM 9.9 08/01/2017 0835   ALKPHOS 214 (H) 04/03/2022 1053   ALKPHOS 142 08/01/2017 0835   AST 41 04/03/2022 1053   AST 15 08/01/2017 0835   ALT 27 04/03/2022 1053   ALT 19 08/01/2017 0835   BILITOT 0.5 04/03/2022 1053   BILITOT 0.35 08/01/2017 0835       RADIOGRAPHIC STUDIES:  No results found.   ASSESSMENT/PLAN:  This is a very pleasant 61 year old Caucasian female with extensive stage small cell lung cancer.  She presented with a right upper lobe lung mass, large right anterior mediastinal and supraclavicular lymphadenopathy as well as a pancreatic and splenic metastasis.  She was diagnosed in September 2017.    The patient underwent systemic chemotherapy with carboplatin and etoposide.  She is status post 6 cycles.  She tolerated treatment well except for chemotherapy-induced anemia which required  pRBCs and platelet transfusions.  She had a significant improvement of her disease with chemotherapy. The patient then underwent prophylactic cranial irradiation. She then underwent stereotactic  radiotherapy to the right upper lobe and pulmonary nodule.   She had been on observation for 2 years before showing evidence of disease progression.    She then was started on systemic chemotherapy with carboplatin, etoposide, and Tecentriq. Starting from cycle #5, she has been on maintenance single agent Tecentriq.  She has been tolerating treatment well. She is status post 64  cycles of Tecentriq total.     Labs were reviewed.  Recommend that she proceed with cycle #65 today as scheduled.  I will arrange for restaging CT scan of the chest, abdomen, and pelvis prior to starting her next cycle of treatment.  Potassium is slightly low  at 3.3.  The patient has liquid potassium 20 mEq at home.  Instructed the patient to take this once a day for approximately 5 days.  Reiterated again strategies to improve nutritional intake which is likely contributing to her fatigue.  The patient generally does not take good care of of her health.  She is scheduled to see a member the nutritionist team today.  Discussed that she finds the boost and Ensure to be too thick but she can water this down with ice cubes, skin milk, etc.   We will see her back for follow-up visit in 3 weeks for evaluation and to review her scan results before starting cycle #66.  The patient was requesting a refill of oxycodone today.  According to PMP aware, she was prescribed tramadol 120 tablets on 03/21/2022.  Discussed with the patient to take tramadol for pain control.  If she does not have adequate pain control with tramadol, then she needs to reach out to her prescribing provider to see if this can be switched to an alternative medication such as oxycodone.  The patient was advised to call immediately if she has any concerning symptoms in the interval. The patient voices understanding of current disease status and treatment options and is in agreement with the current care plan. All questions were answered. The patient knows to  call the clinic with any problems, questions or concerns. We can certainly see the patient much sooner if necessary    Orders Placed This Encounter  Procedures   CT Chest W Contrast    Standing Status:   Future    Standing Expiration Date:   04/03/2023    Order Specific Question:   If indicated for the ordered procedure, I authorize the administration of contrast media per Radiology protocol    Answer:   Yes    Order Specific Question:   Preferred imaging location?    Answer:   [facility]   CT Abdomen Pelvis W Contrast    Standing Status:   Future    Standing Expiration Date:   04/03/2023    Order Specific Question:   If indicated for the ordered procedure, I authorize the administration of contrast media per Radiology protocol    Answer:   Yes    Order Specific Question:   Preferred imaging location?    Answer:   [facility]    Order Specific Question:   Is Oral Contrast requested for this exam?    Answer:   Yes, Per Radiology protocol    The total time spent in the appointment was 20-29 minutes.   [place] L Daveena Elmore, PA-C 04/03/22

## 2022-04-03 ENCOUNTER — Inpatient Hospital Stay: Payer: Medicare Other | Attending: Internal Medicine | Admitting: Physician Assistant

## 2022-04-03 ENCOUNTER — Other Ambulatory Visit: Payer: Self-pay

## 2022-04-03 ENCOUNTER — Inpatient Hospital Stay: Payer: Medicare Other | Admitting: Dietician

## 2022-04-03 ENCOUNTER — Inpatient Hospital Stay: Payer: Medicare Other

## 2022-04-03 VITALS — BP 97/79 | HR 85 | Temp 98.1°F | Resp 12 | Ht 63.0 in | Wt 97.2 lb

## 2022-04-03 DIAGNOSIS — Z5112 Encounter for antineoplastic immunotherapy: Secondary | ICD-10-CM | POA: Diagnosis present

## 2022-04-03 DIAGNOSIS — Z79899 Other long term (current) drug therapy: Secondary | ICD-10-CM | POA: Diagnosis not present

## 2022-04-03 DIAGNOSIS — I700 Atherosclerosis of aorta: Secondary | ICD-10-CM | POA: Insufficient documentation

## 2022-04-03 DIAGNOSIS — R5383 Other fatigue: Secondary | ICD-10-CM

## 2022-04-03 DIAGNOSIS — M8008XA Age-related osteoporosis with current pathological fracture, vertebra(e), initial encounter for fracture: Secondary | ICD-10-CM | POA: Diagnosis not present

## 2022-04-03 DIAGNOSIS — C3491 Malignant neoplasm of unspecified part of right bronchus or lung: Secondary | ICD-10-CM

## 2022-04-03 DIAGNOSIS — D6481 Anemia due to antineoplastic chemotherapy: Secondary | ICD-10-CM | POA: Insufficient documentation

## 2022-04-03 DIAGNOSIS — R748 Abnormal levels of other serum enzymes: Secondary | ICD-10-CM | POA: Insufficient documentation

## 2022-04-03 DIAGNOSIS — R197 Diarrhea, unspecified: Secondary | ICD-10-CM | POA: Diagnosis not present

## 2022-04-03 DIAGNOSIS — I10 Essential (primary) hypertension: Secondary | ICD-10-CM | POA: Insufficient documentation

## 2022-04-03 DIAGNOSIS — E785 Hyperlipidemia, unspecified: Secondary | ICD-10-CM | POA: Diagnosis not present

## 2022-04-03 DIAGNOSIS — C3411 Malignant neoplasm of upper lobe, right bronchus or lung: Secondary | ICD-10-CM | POA: Diagnosis not present

## 2022-04-03 DIAGNOSIS — Z7902 Long term (current) use of antithrombotics/antiplatelets: Secondary | ICD-10-CM | POA: Diagnosis not present

## 2022-04-03 LAB — CBC WITH DIFFERENTIAL (CANCER CENTER ONLY)
Abs Immature Granulocytes: 0.01 10*3/uL (ref 0.00–0.07)
Basophils Absolute: 0 10*3/uL (ref 0.0–0.1)
Basophils Relative: 0 %
Eosinophils Absolute: 0.3 10*3/uL (ref 0.0–0.5)
Eosinophils Relative: 5 %
HCT: 33.4 % — ABNORMAL LOW (ref 36.0–46.0)
Hemoglobin: 11.5 g/dL — ABNORMAL LOW (ref 12.0–15.0)
Immature Granulocytes: 0 %
Lymphocytes Relative: 21 %
Lymphs Abs: 1.4 10*3/uL (ref 0.7–4.0)
MCH: 36.3 pg — ABNORMAL HIGH (ref 26.0–34.0)
MCHC: 34.4 g/dL (ref 30.0–36.0)
MCV: 105.4 fL — ABNORMAL HIGH (ref 80.0–100.0)
Monocytes Absolute: 0.4 10*3/uL (ref 0.1–1.0)
Monocytes Relative: 6 %
Neutro Abs: 4.5 10*3/uL (ref 1.7–7.7)
Neutrophils Relative %: 68 %
Platelet Count: 287 10*3/uL (ref 150–400)
RBC: 3.17 MIL/uL — ABNORMAL LOW (ref 3.87–5.11)
RDW: 13.2 % (ref 11.5–15.5)
WBC Count: 6.6 10*3/uL (ref 4.0–10.5)
nRBC: 0 % (ref 0.0–0.2)

## 2022-04-03 LAB — CMP (CANCER CENTER ONLY)
ALT: 27 U/L (ref 0–44)
AST: 41 U/L (ref 15–41)
Albumin: 3.6 g/dL (ref 3.5–5.0)
Alkaline Phosphatase: 214 U/L — ABNORMAL HIGH (ref 38–126)
Anion gap: 6 (ref 5–15)
BUN: 5 mg/dL — ABNORMAL LOW (ref 8–23)
CO2: 29 mmol/L (ref 22–32)
Calcium: 8.7 mg/dL — ABNORMAL LOW (ref 8.9–10.3)
Chloride: 104 mmol/L (ref 98–111)
Creatinine: 0.55 mg/dL (ref 0.44–1.00)
GFR, Estimated: >60 mL/min (ref >60)
Glucose, Bld: 108 mg/dL — ABNORMAL HIGH (ref 70–99)
Potassium: 3.3 mmol/L — ABNORMAL LOW (ref 3.5–5.1)
Sodium: 139 mmol/L (ref 135–145)
Total Bilirubin: 0.5 mg/dL (ref 0.3–1.2)
Total Protein: 6.3 g/dL — ABNORMAL LOW (ref 6.5–8.1)

## 2022-04-03 LAB — TSH: TSH: 1.948 u[IU]/mL (ref 0.350–4.500)

## 2022-04-03 MED ORDER — HEPARIN SOD (PORK) LOCK FLUSH 100 UNIT/ML IV SOLN
500.0000 [IU] | Freq: Once | INTRAVENOUS | Status: AC | PRN
  Administered 2022-04-03: 500 [IU]

## 2022-04-03 MED ORDER — SODIUM CHLORIDE 0.9% FLUSH
10.0000 mL | INTRAVENOUS | Status: DC | PRN
  Administered 2022-04-03: 10 mL

## 2022-04-03 MED ORDER — SODIUM CHLORIDE 0.9 % IV SOLN
1200.0000 mg | Freq: Once | INTRAVENOUS | Status: AC
  Administered 2022-04-03: 1200 mg via INTRAVENOUS
  Filled 2022-04-03: qty 20

## 2022-04-03 MED ORDER — SODIUM CHLORIDE 0.9 % IV SOLN: Freq: Once | INTRAVENOUS | Status: AC

## 2022-04-03 MED ORDER — ALTEPLASE 2 MG IJ SOLR: 2.0000 mg | Freq: Once | INTRAMUSCULAR | Status: DC | PRN

## 2022-04-03 NOTE — Patient Instructions (Signed)
[facility]  Discharge Instructions: Thank you for choosing [facility] to provide your oncology and hematology care.   If you have a lab appointment with the [facility], please go directly to the [facility] and check in at the registration area.   Wear comfortable clothing and clothing appropriate for easy access to any Portacath or PICC line.   We strive to give you quality time with your provider. You may need to reschedule your appointment if you arrive late (15 or more minutes).  Arriving late affects you and other patients whose appointments are after yours.  Also, if you miss three or more appointments without notifying the office, you may be dismissed from the clinic at the provider's discretion.      For prescription refill requests, have your pharmacy contact our office and allow 72 hours for refills to be completed.    Today you received the following chemotherapy and/or immunotherapy agents: atezolizumab      To help prevent nausea and vomiting after your treatment, we encourage you to take your nausea medication as directed.  BELOW ARE SYMPTOMS THAT SHOULD BE REPORTED IMMEDIATELY: *FEVER GREATER THAN 100.4 F (38 C) OR HIGHER *CHILLS OR SWEATING *NAUSEA AND VOMITING THAT IS NOT CONTROLLED WITH YOUR NAUSEA MEDICATION *UNUSUAL SHORTNESS OF BREATH *UNUSUAL BRUISING OR BLEEDING *URINARY PROBLEMS (pain or burning when urinating, or frequent urination) *BOWEL PROBLEMS (unusual diarrhea, constipation, pain near the anus) TENDERNESS IN MOUTH AND THROAT WITH OR WITHOUT PRESENCE OF ULCERS (sore throat, sores in mouth, or a toothache) UNUSUAL RASH, SWELLING OR PAIN  UNUSUAL VAGINAL DISCHARGE OR ITCHING   Items with * indicate a potential emergency and should be followed up as soon as possible or go to the Emergency Department if any problems should occur.  Please show the CHEMOTHERAPY ALERT CARD or IMMUNOTHERAPY ALERT CARD at check-in  to the Emergency Department and triage nurse.  Should you have questions after your visit or need to cancel or reschedule your appointment, please contact [facility]  Dept: [phone]  and follow the prompts.  Office hours are 8:00 a.m. to 4:30 p.m. Monday - Friday. Please note that voicemails left after 4:00 p.m. may not be returned until the following business day.  We are closed weekends and major holidays. You have access to a nurse at all times for urgent questions. Please call the main number to the clinic Dept: [phone] and follow the prompts.   For any non-urgent questions, you may also contact your provider using MyChart. We now offer e-Visits for anyone 84 and older to request care online for non-urgent symptoms. For details visit [email or web address].   Also download the MyChart app! Go to the app store, search "MyChart", open the app, select [facility], and log in with your MyChart username and password.  Masks are optional in the cancer centers. If you would like for your care team to wear a mask while they are taking care of you, please let them know. You may have one support person who is at least 63 years old accompany you for your appointments.

## 2022-04-18 NOTE — Progress Notes (Signed)
[place], [place] [address]  DIAGNOSIS: Extensive stage (T2a, N3, [name], large right anterior mediastinal and supraclavicular lymphadenopathy as well as pancreatic and splenic metastasis diagnosed in September 2017.  The patient had disease progression in October 2019.  PRIOR THERAPY: 1)) Systemic chemotherapy was carboplatin for AUC of 5 on day 1 and etoposide 100 MG/M2 on days 1, 2 and 3 with Neulasta support. Status post 6 cycles with significant response of her disease. 2) Prophylactic cranial irradiation under the care of Dr. Sondra Come on 12/02/2016. 3) stereotactic radiotherapy to the recurrent right upper lobe pulmonary nodule under the care of Dr. Sondra Come completed November 10, 2017. 4) Retreatment with systemic chemotherapy with carboplatin for AUC of 5 on day 1 and etoposide 100 mg/M2 on days 1, 2 and 3 as well as Tecentriq (Atezolizumab) 1200 mg IV every 3 weeks with Neulasta support.  First dose June 22, 2018 for disease recurrence.  Status post 5 cycles.  Starting from cycle #2 her dose of carboplatin will be reduced to AUC of 4 and etoposide 80 mg/M2 on days 1, 2 and 3 in addition to the regular dose of Tecentriq.  CURRENT THERAPY: Maintenance treatment with single agent Tecentriq 1200 mg IV every 3 weeks.  Status post 65 cycles.  INTERVAL HISTORY: Debra Kidd 61 y.o. female returns to the clinic today for a follow-up visit accompanied by her husband. The patient is feeling fair today without any concerning complaints except for persistent fatigue and generalized weakness.  She is followed closely by social work and nutrition.  The patient is not very active and does not eat or drink a well-balanced diet.  She often receives complementary food items and supplemental protein drinks in our clinic to take home.   The patient has been on  several medications that can cause drowsiness such as Atarax, Benadryl, and pain medication.  She was previously instructed to discontinue taking these.  She mentions today that she establish care with a new PCP. She recently was given a prescription for tramadol. She was previously being prescribed oxycodone for pain. She does not feel like the tramadol is controlling her chronic back pain. She asked to be prescribed oxycodone. She was reportedly asked to perform multiple urine drug screenings and reportedly passed all of them.   Otherwise she denies any new concerns today.  The patient is currently undergoing treatment with Tecentriq. She tolerates this well except for dry skin and itching. She uses hydrocortisone cream. She denies any fever, chills, or night sweats.   She denies any nausea, vomiting, or constipation.  She sometimes has intermittent diarrhea. She has frequent hypokalemia on labs likely because of this.  She states she has potassium supplements at home. She also was instructed to take magnesium by her PCPC. She reports her baseline dyspnea on exertion which is stable without any chest pain or hemoptysis.  She reports a baseline mild cough and she continues to smoke approximately 2 packs of cigarettes per day.  She recently had a restaging CT scan performed.  She is here today for evaluation and review her scan results before undergoing cycle #66.  MEDICAL HISTORY: Past Medical History:  Diagnosis Date   Basal cell carcinoma of cheek    L side of face   Blood type, Rh positive    Cancer (HCC)    Chronic pain syndrome  Depression 08/07/2016   Encounter for antineoplastic chemotherapy 07/17/2016   History of external beam radiation therapy 11/19/16-12/02/16   brain 25 Gy in 10 fractions   Hyperlipidemia    Hypertension    Hypertension 08/07/2016   Osteoporosis    Small cell lung cancer ([facility]) dx'd 05/2016    ALLERGIES:  is allergic to codeine, motrin [ibuprofen], thiazide-type  diuretics, and vicodin [hydrocodone-acetaminophen].  MEDICATIONS:  Current Outpatient Medications  Medication Sig Dispense Refill   atorvastatin (LIPITOR) 80 MG tablet Take 1 tablet (80 mg total) by mouth daily. 30 tablet 11   clopidogrel (PLAVIX) 75 MG tablet Take 1 tablet (75 mg total) by mouth daily. 90 tablet 3   diazepam (VALIUM) 5 MG tablet Take 5 mg by mouth 3 (three) times daily as needed for anxiety.   2   lidocaine-prilocaine (EMLA) cream Apply 1 application. topically as needed. 30 g 2   potassium chloride 20 MEQ/15ML (10%) SOLN Take 15 mLs (20 mEq total) by mouth daily. 100 mL 0   traMADol (ULTRAM) 50 MG tablet Take 1 tablet (50 mg total) by mouth 4 (four) times daily as needed for moderate pain (confirmed with her Pharmacy-Dr K. Little 03/15 she received #120). 30 tablet 0   UNABLE TO FIND Med Name: "cholesterol medication"     No current facility-administered medications for this visit.    SURGICAL HISTORY:  Past Surgical History:  Procedure Laterality Date   ABDOMINAL HYSTERECTOMY  1981   APPENDECTOMY     at age 8   EYE SURGERY     left   IR GENERIC HISTORICAL  06/03/2016   IR FLUORO GUIDE PORT INSERTION RIGHT 06/03/2016 [facility]   IR GENERIC HISTORICAL  06/03/2016   IR US GUIDE VASC ACCESS RIGHT 06/03/2016 [facility]   SKIN CANCER EXCISION     basal cell carcinoma L side of face    REVIEW OF SYSTEMS:   Review of Systems  Constitutional: Positive for decreased appetite and fatigue. Negative for  chills, fever and unexpected weight change.  HENT: Negative for mouth sores, nosebleeds, sore throat and trouble swallowing.   Eyes: Negative for eye problems and icterus.  Respiratory: Positive for SOB and mild cough which are her baseline. Negative for hemoptysis, and wheezing.   Cardiovascular: Negative for chest pain and leg swelling.  Gastrointestinal: Positive for chronic diarrhea.  Negative for abdominal pain, constipation,  nausea and vomiting.   Genitourinary: Negative for bladder incontinence, difficulty urinating, dysuria, frequency and hematuria.   Musculoskeletal: Positive for chronic back pain. Negative for gait problem, neck pain and neck stiffness.  Skin: Positive for itching. Negative for rash.  Neurological: Positive for generalized weakness.  Positive for left-sided weakness due to her history of stroke.  Negative for extremity weakness, gait problem, headaches, [name], depression and sleep disturbance. The patient is not nervous/anxious.      PHYSICAL EXAMINATION:  Blood pressure 98/73, pulse 89, temperature 98.6 F (37 C), temperature source Oral, resp. rate 15, weight 96 lb 8 oz (43.8 kg), SpO2 100 %.  ECOG PERFORMANCE STATUS: 2-3  Physical Exam  Constitutional: Oriented to person, place, and time and chronically ill appearing female appears older than stated age and in no distress. HENT: Head: Normocephalic and atraumatic. Mouth/Throat: Oropharynx is clear and moist. No oropharyngeal exudate. Eyes: Conjunctivae are normal. Right eye exhibits no discharge. Left eye exhibits no discharge. No scleral icterus. Neck: Normal range  of motion. Neck supple. Cardiovascular: Normal rate, regular rhythm, normal heart sounds and intact distal pulses.   Pulmonary/Chest: Effort normal.  Quiet breath sounds in all lung fields.  No respiratory distress. No wheezes. No rales. Abdominal: Soft. Bowel sounds are normal. Exhibits no distension and no mass. There is no tenderness.  Musculoskeletal: Normal range of motion. Exhibits no edema.  Lymphadenopathy:    No cervical adenopathy.  Neurological: Alert and oriented to person, place, and time. Exhibits muscle wasting. Examined in the wheelchair. Chronic left sided weakness due to hx of CVA.  Skin: Skin is warm and dry. No rash noted. Not  diaphoretic. No erythema. No pallor.  Psychiatric: Mood, memory and judgment normal. Vitals reviewed.  LABORATORY DATA: Lab Results  Component Value Date   WBC 6.1 04/24/2022   HGB 12.2 04/24/2022   HCT 35.2 (L) 04/24/2022   MCV 104.5 (H) 04/24/2022   PLT 279 04/24/2022      Chemistry      Component Value Date/Time   NA 139 04/03/2022 1053   NA 141 08/01/2017 0835   K 3.3 (L) 04/03/2022 1053   K 3.5 08/01/2017 0835   CL 104 04/03/2022 1053   CO2 29 04/03/2022 1053   CO2 25 08/01/2017 0835   BUN 5 (L) 04/03/2022 1053   BUN 8.0 08/01/2017 0835   CREATININE 0.55 04/03/2022 1053   CREATININE 0.9 08/01/2017 0835      Component Value Date/Time   CALCIUM 8.7 (L) 04/03/2022 1053   CALCIUM 9.9 08/01/2017 0835   ALKPHOS 214 (H) 04/03/2022 1053   ALKPHOS 142 08/01/2017 0835   AST 41 04/03/2022 1053   AST 15 08/01/2017 0835   ALT 27 04/03/2022 1053   ALT 19 08/01/2017 0835   BILITOT 0.5 04/03/2022 1053   BILITOT 0.35 08/01/2017 0835       RADIOGRAPHIC STUDIES:  CT Chest W Contrast  Result Date: 04/23/2022 CLINICAL DATA:  Small cell lung cancer, assess treatment response. * Tracking Code: BO * EXAM: CT CHEST, ABDOMEN, AND PELVIS WITH CONTRAST TECHNIQUE: Multidetector CT imaging of the chest, abdomen and pelvis was performed following the standard protocol during bolus administration of intravenous contrast. RADIATION DOSE REDUCTION: This exam was performed according to the departmental dose-optimization program which includes automated exposure control, adjustment of the mA and/or kV according to patient size and/or use of iterative reconstruction technique. CONTRAST:  117mL OMNIPAQUE IOHEXOL 300 MG/ML  SOLN COMPARISON:  01/24/2022. FINDINGS: CT CHEST FINDINGS Cardiovascular: Right IJ Port-A-Cath terminates at the SVC RA junction. Atherosclerotic calcification of the aorta and coronary arteries. Ulcerative plaque along the lateral transverse aorta. Heart size normal. No pericardial  effusion. Mediastinum/Nodes: No pathologically enlarged mediastinal, hilar or axillary lymph nodes. Esophagus is unremarkable. Lungs/Pleura: Subpleural spiculated consolidation in the apical segment right upper lobe, unchanged. Subsegmental linear volume loss in the lower lobes. No suspicious pulmonary nodules. No pleural fluid. Airway is unremarkable. Musculoskeletal: No worrisome lytic or sclerotic lesions. Cervical and thoracic compression deformities, as before. CT ABDOMEN PELVIS FINDINGS Hepatobiliary: Liver and gallbladder are unremarkable. No biliary ductal dilatation. Pancreas: Negative. Spleen: Negative. Adrenals/Urinary Tract: Adrenal glands and kidneys are unremarkable. Ureters are decompressed. Bladder is grossly unremarkable. Stomach/Bowel: Stomach, small bowel and colon are unremarkable. Appendix is not readily visualized. Vascular/Lymphatic: Atherosclerotic calcification of the aorta. No pathologically enlarged lymph nodes. Reproductive: Hysterectomy.  No adnexal mass. Other: No free fluid.  Mesenteries and peritoneum are unremarkable. Musculoskeletal: No worrisome lytic or sclerotic lesions. L2 compression fracture, unchanged. IMPRESSION: 1. Treated bronchogenic carcinoma  in the right upper lobe. No evidence of recurrent or metastatic disease. 2. Aortic atherosclerosis (ICD10-I70.0). Coronary artery calcification. Electronically Signed   By: Lorin Picket M.D.   On: 04/23/2022 08:31   CT Abdomen Pelvis W Contrast  Result Date: 04/23/2022 CLINICAL DATA:  Small cell lung cancer, assess treatment response. * Tracking Code: BO * EXAM: CT CHEST, ABDOMEN, AND PELVIS WITH CONTRAST TECHNIQUE: Multidetector CT imaging of the chest, abdomen and pelvis was performed following the standard protocol during bolus administration of intravenous contrast. RADIATION DOSE REDUCTION: This exam was performed according to the departmental dose-optimization program which includes automated exposure control,  adjustment of the mA and/or kV according to patient size and/or use of iterative reconstruction technique. CONTRAST:  145mL OMNIPAQUE IOHEXOL 300 MG/ML  SOLN COMPARISON:  01/24/2022. FINDINGS: CT CHEST FINDINGS Cardiovascular: Right IJ Port-A-Cath terminates at the SVC RA junction. Atherosclerotic calcification of the aorta and coronary arteries. Ulcerative plaque along the lateral transverse aorta. Heart size normal. No pericardial effusion. Mediastinum/Nodes: No pathologically enlarged mediastinal, hilar or axillary lymph nodes. Esophagus is unremarkable. Lungs/Pleura: Subpleural spiculated consolidation in the apical segment right upper lobe, unchanged. Subsegmental linear volume loss in the lower lobes. No suspicious pulmonary nodules. No pleural fluid. Airway is unremarkable. Musculoskeletal: No worrisome lytic or sclerotic lesions. Cervical and thoracic compression deformities, as before. CT ABDOMEN PELVIS FINDINGS Hepatobiliary: Liver and gallbladder are unremarkable. No biliary ductal dilatation. Pancreas: Negative. Spleen: Negative. Adrenals/Urinary Tract: Adrenal glands and kidneys are unremarkable. Ureters are decompressed. Bladder is grossly unremarkable. Stomach/Bowel: Stomach, small bowel and colon are unremarkable. Appendix is not readily visualized. Vascular/Lymphatic: Atherosclerotic calcification of the aorta. No pathologically enlarged lymph nodes. Reproductive: Hysterectomy.  No adnexal mass. Other: No free fluid.  Mesenteries and peritoneum are unremarkable. Musculoskeletal: No worrisome lytic or sclerotic lesions. L2 compression fracture, unchanged. IMPRESSION: 1. Treated bronchogenic carcinoma in the right upper lobe. No evidence of recurrent or metastatic disease. 2. Aortic atherosclerosis (ICD10-I70.0). Coronary artery calcification. Electronically Signed   By: Lorin Picket M.D.   On: 04/23/2022 08:31     ASSESSMENT/PLAN:  This is a very pleasant 61 year old Caucasian female with  extensive stage small cell lung cancer.  She presented with a right upper lobe lung mass, large right anterior mediastinal and supraclavicular lymphadenopathy as well as a pancreatic and splenic metastasis.  She was diagnosed in September 2017.    The patient underwent systemic chemotherapy with carboplatin and etoposide.  She is status post 6 cycles.  She tolerated treatment well except for chemotherapy-induced anemia which required  pRBCs and platelet transfusions.  She had a significant improvement of her disease with chemotherapy. The patient then underwent prophylactic cranial irradiation. She then underwent stereotactic radiotherapy to the right upper lobe and pulmonary nodule.   She had been on observation for 2 years before showing evidence of disease progression.    She then was started on systemic chemotherapy with carboplatin, etoposide, and Tecentriq. Starting from cycle #5, she has been on maintenance single agent Tecentriq.  She has been tolerating treatment well. She is status post 65  cycles of Tecentriq total.  The patient recently had a restaging CT scan performed.  Dr. Julien Nordmann personally and independently reviewed the scan and discussed the results with the patient today.  The scan showed no evidence of disease progression.  Dr. Julien Nordmann recommends that the patient continue on the same treatment at the same dose.  Her LFT's are elevated. She denies new medications except for vitamin d. She denies alcohol or tylenol  use. Advised to avoid taking tylenol, tramadol, or any unnecessary medications. Discussed she will need to continue her plavix due to her history of strokes. Her liver was unremarkable on recent CT imaging. We will delay treatment by 1 week. In the meantime, we will start her on a medrol dosepack for presumed drug induced elevated LFTs. We will recheck her LFTs next week. Advised to call if she develops any new or worsening symptoms such as abdominal pain, jaundice, fevers,  nausea, vomiting, etc.   We will see her back for follow-up visit in 1 weeks for evaluation and repeat blood work before starting cycle #66.  Reiterated again strategies to improve nutritional intake which is likely contributing to her fatigue.  The patient generally does not take good care of of her health.  She has been seen by member the nutritionist team in the past.  Discussed that she finds the boost and Ensure to be too thick but she can water this down with ice cubes, skin milk, etc.  Trauma encouraged the patient to quit smoking.  The patient would likely have improvement in her appetite and her breathing if she quit.  Patient is not interested in quitting at this time.  We will see her back for follow-up visit in 3 weeks before starting cycle #67.  Dr. Julien Nordmann encouraged the patient to discuss being referred to a pain clinic with her PCP for her chronic back pain.  Guarding her itching, the patient will continue to use topical hydrocortisone cream.  The Atarax and Benadryl make her too drowsy.  Encouraged her to take a Claritin or Zyrtec which will hopefully help somewhat with the itching and cause less drowsiness.  The patient was advised to call immediately if she has any concerning symptoms in the interval. The patient voices understanding of current disease status and treatment options and is in agreement with the current care plan. All questions were answered. The patient knows to call the clinic with any problems, questions or concerns. We can certainly see the patient much sooner if necessary  No orders of the defined types were placed in this encounter.    Letita Prentiss L Kameran Lallier, PA-C 04/24/22  ADDENDUM: Hematology/Oncology Attending: I had a face-to-face encounter with the patient today.  I reviewed her records, lab, scan and recommended her care plan.  This is a very pleasant 61 years old white female with extensive stage small cell lung cancer that was initially  diagnosed in September 2017 initially treated with carboplatin and Doutova site and was on remission for almost 2 years before she had evidence for disease recurrence and started treatment again with systemic chemotherapy with carboplatin, etoposide and atezolizumab for initial 5 cycles followed by maintenance treatment with atezolizumab for a total treatment of 65 cycles.  The patient has been tolerating this treatment well with no concerning adverse effect except for itching.  She continues to have chronic back pain and she is taken over-the-counter medication.  She could not receive her pain medication from new primary care physician after retirement of the previous provider.  She will try to reach out to [facility] for pain management. The patient had repeat CT scan of the chest, abdomen and pelvis performed recently.  I personally and independently reviewed the scan and discussed the results with the patient and her husband. Her scan showed no concerning findings for disease progression. I recommended for her to continue her current treatment with the same maintenance atezolizumab every 3 weeks.  We will delay  the start of cycle number 66 by 1 week because of the elevated liver enzymes on the blood work today.  We will also start her on a Medrol Dosepak during this week. The patient will come back for follow-up visit in 4 weeks for evaluation before the next cycle of her treatment. The patient was advised to call immediately if she has any other concerning symptoms in the interval. The total time spent in the appointment was 30 minutes. Disclaimer: This note was dictated with voice recognition software. Similar sounding words can inadvertently be transcribed and may be missed upon review. Eilleen Kempf, MD

## 2022-04-21 ENCOUNTER — Ambulatory Visit (HOSPITAL_COMMUNITY)
Admission: RE | Admit: 2022-04-21 | Discharge: 2022-04-21 | Disposition: A | Discharge status: Home / Self Care | Payer: Medicare Other | Source: Ambulatory Visit | Attending: Physician Assistant | Admitting: Physician Assistant

## 2022-04-21 DIAGNOSIS — C3491 Malignant neoplasm of unspecified part of right bronchus or lung: Secondary | ICD-10-CM | POA: Diagnosis present

## 2022-04-21 MED ORDER — SODIUM CHLORIDE (PF) 0.9 % IJ SOLN
INTRAMUSCULAR | Status: AC
  Filled 2022-04-21: qty 50

## 2022-04-21 MED ORDER — HEPARIN SOD (PORK) LOCK FLUSH 100 UNIT/ML IV SOLN
INTRAVENOUS | Status: AC
  Filled 2022-04-21: qty 5

## 2022-04-21 MED ORDER — IOHEXOL 300 MG/ML  SOLN
100.0000 mL | Freq: Once | INTRAMUSCULAR | Status: AC | PRN
  Administered 2022-04-21: 100 mL via INTRAVENOUS

## 2022-04-24 ENCOUNTER — Other Ambulatory Visit: Payer: Self-pay

## 2022-04-24 ENCOUNTER — Inpatient Hospital Stay (HOSPITAL_BASED_OUTPATIENT_CLINIC_OR_DEPARTMENT_OTHER): Payer: Medicare Other | Admitting: Physician Assistant

## 2022-04-24 ENCOUNTER — Inpatient Hospital Stay: Payer: Medicare Other

## 2022-04-24 VITALS — BP 98/73 | HR 89 | Temp 98.6°F | Resp 15 | Wt 96.5 lb

## 2022-04-24 DIAGNOSIS — C3491 Malignant neoplasm of unspecified part of right bronchus or lung: Secondary | ICD-10-CM

## 2022-04-24 DIAGNOSIS — R7989 Other specified abnormal findings of blood chemistry: Secondary | ICD-10-CM

## 2022-04-24 DIAGNOSIS — Z5112 Encounter for antineoplastic immunotherapy: Secondary | ICD-10-CM | POA: Diagnosis not present

## 2022-04-24 DIAGNOSIS — R5383 Other fatigue: Secondary | ICD-10-CM

## 2022-04-24 LAB — CMP (CANCER CENTER ONLY)
ALT: 168 U/L — ABNORMAL HIGH (ref 0–44)
AST: 290 U/L (ref 15–41)
Albumin: 3.2 g/dL — ABNORMAL LOW (ref 3.5–5.0)
Alkaline Phosphatase: 418 U/L — ABNORMAL HIGH (ref 38–126)
Anion gap: 11 (ref 5–15)
BUN: <5 mg/dL — ABNORMAL LOW (ref 8–23)
CO2: 26 mmol/L (ref 22–32)
Calcium: 8.6 mg/dL — ABNORMAL LOW (ref 8.9–10.3)
Chloride: 102 mmol/L (ref 98–111)
Creatinine: 0.73 mg/dL (ref 0.44–1.00)
GFR, Estimated: >60 mL/min (ref >60)
Glucose, Bld: 104 mg/dL — ABNORMAL HIGH (ref 70–99)
Potassium: 3.3 mmol/L — ABNORMAL LOW (ref 3.5–5.1)
Sodium: 139 mmol/L (ref 135–145)
Total Bilirubin: 1 mg/dL (ref 0.3–1.2)
Total Protein: 6.4 g/dL — ABNORMAL LOW (ref 6.5–8.1)

## 2022-04-24 LAB — CBC WITH DIFFERENTIAL (CANCER CENTER ONLY)
Abs Immature Granulocytes: 0.02 10*3/uL (ref 0.00–0.07)
Basophils Absolute: 0.1 10*3/uL (ref 0.0–0.1)
Basophils Relative: 1 %
Eosinophils Absolute: 0.5 10*3/uL (ref 0.0–0.5)
Eosinophils Relative: 9 %
HCT: 35.2 % — ABNORMAL LOW (ref 36.0–46.0)
Hemoglobin: 12.2 g/dL (ref 12.0–15.0)
Immature Granulocytes: 0 %
Lymphocytes Relative: 14 %
Lymphs Abs: 0.9 10*3/uL (ref 0.7–4.0)
MCH: 36.2 pg — ABNORMAL HIGH (ref 26.0–34.0)
MCHC: 34.7 g/dL (ref 30.0–36.0)
MCV: 104.5 fL — ABNORMAL HIGH (ref 80.0–100.0)
Monocytes Absolute: 0.4 10*3/uL (ref 0.1–1.0)
Monocytes Relative: 7 %
Neutro Abs: 4.2 10*3/uL (ref 1.7–7.7)
Neutrophils Relative %: 69 %
Platelet Count: 279 10*3/uL (ref 150–400)
RBC: 3.37 MIL/uL — ABNORMAL LOW (ref 3.87–5.11)
RDW: 13.3 % (ref 11.5–15.5)
WBC Count: 6.1 10*3/uL (ref 4.0–10.5)
nRBC: 0 % (ref 0.0–0.2)

## 2022-04-24 LAB — TSH: TSH: 2.457 u[IU]/mL (ref 0.350–4.500)

## 2022-04-24 MED ORDER — SODIUM CHLORIDE 0.9% FLUSH
10.0000 mL | Freq: Once | INTRAVENOUS | Status: AC
  Administered 2022-04-24: 10 mL via INTRAVENOUS

## 2022-04-24 MED ORDER — HEPARIN SOD (PORK) LOCK FLUSH 100 UNIT/ML IV SOLN
500.0000 [IU] | Freq: Once | INTRAVENOUS | Status: AC
  Administered 2022-04-24: 500 [IU] via INTRAVENOUS

## 2022-04-24 MED ORDER — METHYLPREDNISOLONE 4 MG PO TBPK: ORAL_TABLET | ORAL | 0 refills | Status: DC

## 2022-04-24 MED ORDER — SODIUM CHLORIDE 0.9% FLUSH
10.0000 mL | INTRAVENOUS | Status: DC | PRN
  Administered 2022-04-24: 10 mL

## 2022-04-24 NOTE — Progress Notes (Signed)
Per Cassie PA-C HOLD tx today d/t elevated liver enzymes, AST 290 U/L and ALT 168 U/L.  Pt assisted in w/c to lobby by family. Pt and family agreeable to holding tx.

## 2022-04-24 NOTE — Progress Notes (Unsigned)
CRITICAL VALUE  AST: 290  CALLED TO, READ BACK, AND VERIFIED WITH DIANE BELL, RN @ [id number] ON 08.23.2023 BY Lawana Pai, MLS

## 2022-04-29 ENCOUNTER — Telehealth: Payer: Self-pay | Admitting: Internal Medicine

## 2022-04-29 NOTE — Telephone Encounter (Signed)
Called patient regarding upcoming appointments, patient is notified. 

## 2022-04-29 NOTE — Telephone Encounter (Signed)
Called  [id number]

## 2022-05-02 ENCOUNTER — Inpatient Hospital Stay: Payer: Medicare Other

## 2022-05-02 ENCOUNTER — Other Ambulatory Visit: Payer: Self-pay

## 2022-05-02 ENCOUNTER — Encounter: Payer: Self-pay | Admitting: Internal Medicine

## 2022-05-02 ENCOUNTER — Inpatient Hospital Stay (HOSPITAL_BASED_OUTPATIENT_CLINIC_OR_DEPARTMENT_OTHER): Payer: Medicare Other | Admitting: Internal Medicine

## 2022-05-02 DIAGNOSIS — C3491 Malignant neoplasm of unspecified part of right bronchus or lung: Secondary | ICD-10-CM

## 2022-05-02 DIAGNOSIS — Z5112 Encounter for antineoplastic immunotherapy: Secondary | ICD-10-CM | POA: Diagnosis not present

## 2022-05-02 LAB — CBC WITH DIFFERENTIAL (CANCER CENTER ONLY)
Abs Immature Granulocytes: 0.03 10*3/uL (ref 0.00–0.07)
Basophils Absolute: 0 10*3/uL (ref 0.0–0.1)
Basophils Relative: 0 %
Eosinophils Absolute: 0.3 10*3/uL (ref 0.0–0.5)
Eosinophils Relative: 2 %
HCT: 34.2 % — ABNORMAL LOW (ref 36.0–46.0)
Hemoglobin: 11.9 g/dL — ABNORMAL LOW (ref 12.0–15.0)
Immature Granulocytes: 0 %
Lymphocytes Relative: 31 %
Lymphs Abs: 3.4 10*3/uL (ref 0.7–4.0)
MCH: 36.7 pg — ABNORMAL HIGH (ref 26.0–34.0)
MCHC: 34.8 g/dL (ref 30.0–36.0)
MCV: 105.6 fL — ABNORMAL HIGH (ref 80.0–100.0)
Monocytes Absolute: 0.8 10*3/uL (ref 0.1–1.0)
Monocytes Relative: 7 %
Neutro Abs: 6.5 10*3/uL (ref 1.7–7.7)
Neutrophils Relative %: 60 %
Platelet Count: 348 10*3/uL (ref 150–400)
RBC: 3.24 MIL/uL — ABNORMAL LOW (ref 3.87–5.11)
RDW: 13.3 % (ref 11.5–15.5)
WBC Count: 11 10*3/uL — ABNORMAL HIGH (ref 4.0–10.5)
nRBC: 0 % (ref 0.0–0.2)

## 2022-05-02 LAB — CMP (CANCER CENTER ONLY)
ALT: 65 U/L — ABNORMAL HIGH (ref 0–44)
AST: 51 U/L — ABNORMAL HIGH (ref 15–41)
Albumin: 3.6 g/dL (ref 3.5–5.0)
Alkaline Phosphatase: 256 U/L — ABNORMAL HIGH (ref 38–126)
Anion gap: 4 — ABNORMAL LOW (ref 5–15)
BUN: 6 mg/dL — ABNORMAL LOW (ref 8–23)
CO2: 31 mmol/L (ref 22–32)
Calcium: 8.7 mg/dL — ABNORMAL LOW (ref 8.9–10.3)
Chloride: 102 mmol/L (ref 98–111)
Creatinine: 0.59 mg/dL (ref 0.44–1.00)
GFR, Estimated: >60 mL/min (ref >60)
Glucose, Bld: 96 mg/dL (ref 70–99)
Potassium: 3.4 mmol/L — ABNORMAL LOW (ref 3.5–5.1)
Sodium: 137 mmol/L (ref 135–145)
Total Bilirubin: 0.5 mg/dL (ref 0.3–1.2)
Total Protein: 6.2 g/dL — ABNORMAL LOW (ref 6.5–8.1)

## 2022-05-02 MED ORDER — SODIUM CHLORIDE 0.9 % IV SOLN
1200.0000 mg | Freq: Once | INTRAVENOUS | Status: AC
  Administered 2022-05-02: 1200 mg via INTRAVENOUS
  Filled 2022-05-02: qty 20

## 2022-05-02 MED ORDER — HEPARIN SOD (PORK) LOCK FLUSH 100 UNIT/ML IV SOLN
500.0000 [IU] | Freq: Once | INTRAVENOUS | Status: AC | PRN
  Administered 2022-05-02: 500 [IU]

## 2022-05-02 MED ORDER — SODIUM CHLORIDE 0.9% FLUSH
10.0000 mL | INTRAVENOUS | Status: DC | PRN
  Administered 2022-05-02: 10 mL

## 2022-05-02 MED ORDER — SODIUM CHLORIDE 0.9 % IV SOLN: Freq: Once | INTRAVENOUS | Status: AC

## 2022-05-02 NOTE — Patient Instructions (Signed)
[facility]  Discharge Instructions: Thank you for choosing [facility] to provide your oncology and hematology care.   If you have a lab appointment with the [facility], please go directly to the [facility] and check in at the registration area.   Wear comfortable clothing and clothing appropriate for easy access to any Portacath or PICC line.   We strive to give you quality time with your provider. You may need to reschedule your appointment if you arrive late (15 or more minutes).  Arriving late affects you and other patients whose appointments are after yours.  Also, if you miss three or more appointments without notifying the office, you may be dismissed from the clinic at the provider's discretion.      For prescription refill requests, have your pharmacy contact our office and allow 72 hours for refills to be completed.    Today you received the following chemotherapy and/or immunotherapy agents tecentriq      To help prevent nausea and vomiting after your treatment, we encourage you to take your nausea medication as directed.  BELOW ARE SYMPTOMS THAT SHOULD BE REPORTED IMMEDIATELY: *FEVER GREATER THAN 100.4 F (38 C) OR HIGHER *CHILLS OR SWEATING *NAUSEA AND VOMITING THAT IS NOT CONTROLLED WITH YOUR NAUSEA MEDICATION *UNUSUAL SHORTNESS OF BREATH *UNUSUAL BRUISING OR BLEEDING *URINARY PROBLEMS (pain or burning when urinating, or frequent urination) *BOWEL PROBLEMS (unusual diarrhea, constipation, pain near the anus) TENDERNESS IN MOUTH AND THROAT WITH OR WITHOUT PRESENCE OF ULCERS (sore throat, sores in mouth, or a toothache) UNUSUAL RASH, SWELLING OR PAIN  UNUSUAL VAGINAL DISCHARGE OR ITCHING   Items with * indicate a potential emergency and should be followed up as soon as possible or go to the Emergency Department if any problems should occur.  Please show the CHEMOTHERAPY ALERT CARD or IMMUNOTHERAPY ALERT CARD at check-in to  the Emergency Department and triage nurse.  Should you have questions after your visit or need to cancel or reschedule your appointment, please contact [facility]  Dept: [phone]  and follow the prompts.  Office hours are 8:00 a.m. to 4:30 p.m. Monday - Friday. Please note that voicemails left after 4:00 p.m. may not be returned until the following business day.  We are closed weekends and major holidays. You have access to a nurse at all times for urgent questions. Please call the main number to the clinic Dept: [phone] and follow the prompts.   For any non-urgent questions, you may also contact your provider using MyChart. We now offer e-Visits for anyone 33 and older to request care online for non-urgent symptoms. For details visit [email or web address].   Also download the MyChart app! Go to the app store, search "MyChart", open the app, select [facility], and log in with your MyChart username and password.  Masks are optional in the cancer centers. If you would like for your care team to wear a mask while they are taking care of you, please let them know. You may have one support person who is at least 63 years old accompany you for your appointments.

## 2022-05-02 NOTE — Progress Notes (Signed)
[place] [phone]   Fax:(336) [place], [place] [address]  DIAGNOSIS: Extensive stage (T2a, N3, [name], large right anterior mediastinal and supraclavicular lymphadenopathy as well as pancreatic and splenic metastasis diagnosed in September 2017.  The patient had disease progression in October 2019.  PRIOR THERAPY:  1) Systemic chemotherapy was carboplatin for AUC of 5 on day 1 and etoposide 100 MG/M2 on days 1, 2 and 3 with Neulasta support. Status post 6 cycles with significant response of her disease. 2) Prophylactic cranial irradiation under the care of Dr. Sondra Come on 12/02/2016. 3) stereotactic radiotherapy to the recurrent right upper lobe pulmonary nodule under the care of Dr. Sondra Come completed November 10, 2017. 4) Retreatment with systemic chemotherapy with carboplatin for AUC of 5 on day 1 and etoposide 100 mg/M2 on days 1, 2 and 3 as well as Tecentriq (Atezolizumab) 1200 mg IV every 3 weeks with Neulasta support.  First dose June 22, 2018 for disease recurrence.  Status post 5 cycles.  Starting from cycle #2 her dose of carboplatin will be reduced to AUC of 4 and etoposide 80 mg/M2 on days 1, 2 and 3 in addition to the regular dose of Tecentriq.  CURRENT THERAPY: Maintenance treatment with single agent Tecentriq 1200 mg IV every 3 weeks.  Status post 60 cycles.  INTERVAL HISTORY: Debra Kidd 61 y.o. female returns to the clinic today for follow-up visit accompanied by her husband.  The patient is feeling fine today with no concerning complaints except for feeling hungry after starting treatment with Medrol Dosepak last week.  She completed the Medrol dose pack yesterday.  She denied having any current chest pain but continues to have shortness of breath increased with exertion with no cough or hemoptysis.  She has no  nausea, vomiting, diarrhea or constipation.  She has no headache or visual changes.  Her treatment was delayed from last week because of the elevated liver enzymes.  She is here today for evaluation before resuming her treatment.  MEDICAL HISTORY: Past Medical History:  Diagnosis Date   Basal cell carcinoma of cheek    L side of face   Blood type, Rh positive    Cancer (HCC)    Chronic pain syndrome    Depression 08/07/2016   Encounter for antineoplastic chemotherapy 07/17/2016   History of external beam radiation therapy 11/19/16-12/02/16   brain 25 Gy in 10 fractions   Hyperlipidemia    Hypertension    Hypertension 08/07/2016   Osteoporosis    Small cell lung cancer ([place]) dx'd 05/2016    ALLERGIES:  is allergic to codeine, motrin [ibuprofen], thiazide-type diuretics, and vicodin [hydrocodone-acetaminophen].  MEDICATIONS:  Current Outpatient Medications  Medication Sig Dispense Refill   atorvastatin (LIPITOR) 80 MG tablet Take 1 tablet (80 mg total) by mouth daily. 30 tablet 11   clopidogrel (PLAVIX) 75 MG tablet Take 1 tablet (75 mg total) by mouth daily. 90 tablet 3   diazepam (VALIUM) 5 MG tablet Take 5 mg by mouth 3 (three) times daily as needed for anxiety.   2   lidocaine-prilocaine (EMLA) cream Apply 1 application. topically as needed. 30 g 2   methylPREDNISolone (MEDROL DOSEPAK) 4 MG TBPK tablet Use as instructed 21 tablet 0   potassium chloride 20 MEQ/15ML (10%) SOLN Take 15 mLs (20 mEq total) by mouth daily. 100 mL  0   traMADol (ULTRAM) 50 MG tablet Take 1 tablet (50 mg total) by mouth 4 (four) times daily as needed for moderate pain (confirmed with her Pharmacy-Dr K. Little 03/15 she received #[id number]). 30 tablet 0   UNABLE TO FIND Med Name: "cholesterol medication"     No current facility-administered medications for this visit.    SURGICAL HISTORY:  Past Surgical History:  Procedure Laterality Date   ABDOMINAL HYSTERECTOMY  1981   APPENDECTOMY     at age 11   EYE  SURGERY     left   IR GENERIC HISTORICAL  06/03/2016   IR FLUORO GUIDE PORT INSERTION RIGHT 06/03/2016 [facility]   IR GENERIC HISTORICAL  06/03/2016   IR US GUIDE VASC ACCESS RIGHT 06/03/2016 [facility]   SKIN CANCER EXCISION     basal cell carcinoma L side of face    REVIEW OF SYSTEMS:  Constitutional: positive for fatigue Eyes: negative Ears, nose, mouth, throat, and face: negative Respiratory: positive for dyspnea on exertion Cardiovascular: negative Gastrointestinal: negative Genitourinary:negative Integument/breast: negative Hematologic/lymphatic: negative Musculoskeletal:positive for muscle weakness Neurological: negative Behavioral/Psych: negative Endocrine: negative Allergic/Immunologic: negative   PHYSICAL EXAMINATION: General appearance: alert, cooperative, fatigued, and no distress Head: Normocephalic, without obvious abnormality, atraumatic Neck: no adenopathy, no JVD, supple, symmetrical, trachea midline, and thyroid not enlarged, symmetric, no tenderness/mass/nodules Lymph nodes: Cervical, supraclavicular, and axillary nodes normal. Resp: clear to auscultation bilaterally Back: symmetric, no curvature. ROM normal. No CVA tenderness. Cardio: regular rate and rhythm, S1, S2 normal, no murmur, click, rub or gallop GI: soft, non-tender; bowel sounds normal; no masses,  no organomegaly Extremities: extremities normal, atraumatic, no cyanosis or edema Neurologic: Alert and oriented X 3, normal strength and tone. Normal symmetric reflexes. Normal coordination and gait  ECOG PERFORMANCE STATUS: 1 - Symptomatic but completely ambulatory  Blood pressure 94/73, pulse 80, temperature 98.4 F (36.9 C), temperature source Oral, resp. rate 15, weight 99 lb 3.2 oz (45 kg), SpO2 100 %.  LABORATORY DATA: Lab Results  Component Value Date   WBC 11.0 (H) 05/02/2022   HGB 11.9 (L) 05/02/2022   HCT 34.2 (L) 05/02/2022   MCV 105.6 (H) 05/02/2022   PLT 348 05/02/2022       Chemistry      Component Value Date/Time   NA 139 04/24/2022 1406   NA 141 08/01/2017 0835   K 3.3 (L) 04/24/2022 1406   K 3.5 08/01/2017 0835   CL 102 04/24/2022 1406   CO2 26 04/24/2022 1406   CO2 25 08/01/2017 0835   BUN <5 (L) 04/24/2022 1406   BUN 8.0 08/01/2017 0835   CREATININE 0.73 04/24/2022 1406   CREATININE 0.9 08/01/2017 0835      Component Value Date/Time   CALCIUM 8.6 (L) 04/24/2022 1406   CALCIUM 9.9 08/01/2017 0835   ALKPHOS 418 (H) 04/24/2022 1406   ALKPHOS 142 08/01/2017 0835   AST 290 (HH) 04/24/2022 1406   AST 15 08/01/2017 0835   ALT 168 (H) 04/24/2022 1406   ALT 19 08/01/2017 0835   BILITOT 1.0 04/24/2022 1406   BILITOT 0.35 08/01/2017 0835       RADIOGRAPHIC STUDIES: CT Chest W Contrast  Result Date: 04/23/2022 CLINICAL DATA:  Small cell lung cancer, assess treatment response. * Tracking Code: BO * EXAM: CT CHEST, ABDOMEN, AND PELVIS WITH CONTRAST TECHNIQUE: Multidetector CT imaging of the chest, abdomen and pelvis was performed following the standard protocol during bolus administration of intravenous contrast. RADIATION DOSE REDUCTION: This exam was performed  according to the departmental dose-optimization program which includes automated exposure control, adjustment of the mA and/or kV according to patient size and/or use of iterative reconstruction technique. CONTRAST:  173mL OMNIPAQUE IOHEXOL 300 MG/ML  SOLN COMPARISON:  01/24/2022. FINDINGS: CT CHEST FINDINGS Cardiovascular: Right IJ Port-A-Cath terminates at the SVC RA junction. Atherosclerotic calcification of the aorta and coronary arteries. Ulcerative plaque along the lateral transverse aorta. Heart size normal. No pericardial effusion. Mediastinum/Nodes: No pathologically enlarged mediastinal, hilar or axillary lymph nodes. Esophagus is unremarkable. Lungs/Pleura: Subpleural spiculated consolidation in the apical segment right upper lobe, unchanged. Subsegmental linear volume loss in the lower lobes.  No suspicious pulmonary nodules. No pleural fluid. Airway is unremarkable. Musculoskeletal: No worrisome lytic or sclerotic lesions. Cervical and thoracic compression deformities, as before. CT ABDOMEN PELVIS FINDINGS Hepatobiliary: Liver and gallbladder are unremarkable. No biliary ductal dilatation. Pancreas: Negative. Spleen: Negative. Adrenals/Urinary Tract: Adrenal glands and kidneys are unremarkable. Ureters are decompressed. Bladder is grossly unremarkable. Stomach/Bowel: Stomach, small bowel and colon are unremarkable. Appendix is not readily visualized. Vascular/Lymphatic: Atherosclerotic calcification of the aorta. No pathologically enlarged lymph nodes. Reproductive: Hysterectomy.  No adnexal mass. Other: No free fluid.  Mesenteries and peritoneum are unremarkable. Musculoskeletal: No worrisome lytic or sclerotic lesions. L2 compression fracture, unchanged. IMPRESSION: 1. Treated bronchogenic carcinoma in the right upper lobe. No evidence of recurrent or metastatic disease. 2. Aortic atherosclerosis (ICD10-I70.0). Coronary artery calcification. Electronically Signed   By: Lorin Picket M.D.   On: 04/23/2022 08:31   CT Abdomen Pelvis W Contrast  Result Date: 04/23/2022 CLINICAL DATA:  Small cell lung cancer, assess treatment response. * Tracking Code: BO * EXAM: CT CHEST, ABDOMEN, AND PELVIS WITH CONTRAST TECHNIQUE: Multidetector CT imaging of the chest, abdomen and pelvis was performed following the standard protocol during bolus administration of intravenous contrast. RADIATION DOSE REDUCTION: This exam was performed according to the departmental dose-optimization program which includes automated exposure control, adjustment of the mA and/or kV according to patient size and/or use of iterative reconstruction technique. CONTRAST:  140mL OMNIPAQUE IOHEXOL 300 MG/ML  SOLN COMPARISON:  01/24/2022. FINDINGS: CT CHEST FINDINGS Cardiovascular: Right IJ Port-A-Cath terminates at the SVC RA junction.  Atherosclerotic calcification of the aorta and coronary arteries. Ulcerative plaque along the lateral transverse aorta. Heart size normal. No pericardial effusion. Mediastinum/Nodes: No pathologically enlarged mediastinal, hilar or axillary lymph nodes. Esophagus is unremarkable. Lungs/Pleura: Subpleural spiculated consolidation in the apical segment right upper lobe, unchanged. Subsegmental linear volume loss in the lower lobes. No suspicious pulmonary nodules. No pleural fluid. Airway is unremarkable. Musculoskeletal: No worrisome lytic or sclerotic lesions. Cervical and thoracic compression deformities, as before. CT ABDOMEN PELVIS FINDINGS Hepatobiliary: Liver and gallbladder are unremarkable. No biliary ductal dilatation. Pancreas: Negative. Spleen: Negative. Adrenals/Urinary Tract: Adrenal glands and kidneys are unremarkable. Ureters are decompressed. Bladder is grossly unremarkable. Stomach/Bowel: Stomach, small bowel and colon are unremarkable. Appendix is not readily visualized. Vascular/Lymphatic: Atherosclerotic calcification of the aorta. No pathologically enlarged lymph nodes. Reproductive: Hysterectomy.  No adnexal mass. Other: No free fluid.  Mesenteries and peritoneum are unremarkable. Musculoskeletal: No worrisome lytic or sclerotic lesions. L2 compression fracture, unchanged. IMPRESSION: 1. Treated bronchogenic carcinoma in the right upper lobe. No evidence of recurrent or metastatic disease. 2. Aortic atherosclerosis (ICD10-I70.0). Coronary artery calcification. Electronically Signed   By: Lorin Picket M.D.   On: 04/23/2022 08:31    ASSESSMENT AND PLAN:  This is a very pleasant 61 years old white female with extensive stage small cell lung cancer status post systemic chemotherapy with  carbo platinum and etoposide for 6 cycles and the patient rotated her treatment well except for chemotherapy-induced anemia and requirement for PRBCs and platelet transfusion. She had significant improvement in  her disease with the chemotherapy. She also had prophylactic cranial irradiation. She also underwent stereotactic radiotherapy to progressive right upper lobe pulmonary nodule. The patient has been in observation for close to 2 years. Repeat CT scan of the chest, abdomen and pelvis performed recently showed evidence for disease progression in the abdomen. The patient started on systemic chemotherapy again with carboplatin, etoposide and Tecentriq status post 65 cycles.  Starting from cycle #5 the patient is treated with maintenance single agent Tecentriq. The patient has been tolerating this treatment well with no concerning adverse effects except for the recent elevation of the liver enzymes.  She was treated with 1 week of Medrol Dosepak and there is an improvement in her liver function today. We will resume her treatment and she will proceed with cycle #66 today.  We will continue to monitor her liver enzymes closely on the upcoming blood work.  If she has any significant elevation of her liver function in the future, we will discontinue her treatment and start the patient on a high-dose prednisone with a taper regimen. The patient will come back for follow-up visit in 3 weeks for evaluation before the next cycle of her treatment. For the history of stroke, she is followed by neurology and she is currently on Plavix and aspirin. She was advised to call immediately if she has any other concerning symptoms in the interval. All questions were answered. The patient knows to call the clinic with any problems, questions or concerns. We can certainly see the patient much sooner if necessary.  Disclaimer: This note was dictated with voice recognition software. Similar sounding words can inadvertently be transcribed and may not be corrected upon review.

## 2022-05-02 NOTE — Patient Instructions (Signed)
Steps to Quit Smoking Smoking tobacco is the leading cause of preventable death. It can affect almost every organ in the body. Smoking puts you and people around you at risk for many serious, long-lasting (chronic) diseases. Quitting smoking can be hard, but it is one of the best things that you can do for your health. It is never too late to quit. Do not give up if you cannot quit the first time. Some people need to try many times to quit. Do your best to stick to your quit plan, and talk with your doctor if you have any questions or concerns. How do I get ready to quit? Pick a date to quit. Set a date within the next 2 weeks to give you time to prepare. Write down the reasons why you are quitting. Keep this list in places where you will see it often. Tell your family, friends, and co-workers that you are quitting. Their support is important. Talk with your doctor about the choices that may help you quit. Find out if your health insurance will pay for these treatments. Know the people, places, things, and activities that make you want to smoke (triggers). Avoid them. What first steps can I take to quit smoking? Throw away all cigarettes at home, at work, and in your car. Throw away the things that you use when you smoke, such as ashtrays and lighters. Clean your car. Empty the ashtray. Clean your home, including curtains and carpets. What can I do to help me quit smoking? Talk with your doctor about taking medicines and seeing a counselor. You are more likely to succeed when you do both. If you are pregnant or breastfeeding: Talk with your doctor about counseling or other ways to quit smoking. Do not take medicine to help you quit smoking unless your doctor tells you to. Quit right away Quit smoking completely, instead of slowly cutting back on how much you smoke over a period of time. Stopping smoking right away may be more successful than slowly quitting. Go to counseling. In-person is best  if this is an option. You are more likely to quit if you go to counseling sessions regularly. Take medicine You may take medicines to help you quit. Some medicines need a prescription, and some you can buy over-the-counter. Some medicines may contain a drug called nicotine to replace the nicotine in cigarettes. Medicines may: Help you stop having the desire to smoke (cravings). Help to stop the problems that come when you stop smoking (withdrawal symptoms). Your doctor may ask you to use: Nicotine patches, gum, or lozenges. Nicotine inhalers or sprays. Non-nicotine medicine that you take by mouth. Find resources Find resources and other ways to help you quit smoking and remain smoke-free after you quit. They include: Online chats with a counselor. Phone quitlines. Printed self-help materials. Support groups or group counseling. Text messaging programs. Mobile phone apps. Use apps on your mobile phone or tablet that can help you stick to your quit plan. Examples of free services include Quit Guide from the CDC and smokefree.gov  What can I do to make it easier to quit?  Talk to your family and friends. Ask them to support and encourage you. Call a phone quitline, such as 1-800-QUIT-NOW, reach out to support groups, or work with a counselor. Ask people who smoke to not smoke around you. Avoid places that make you want to smoke, such as: Bars. Parties. Smoke-break areas at work. Spend time with people who do not smoke. Lower   the stress in your life. Stress can make you want to smoke. Try these things to lower stress: Getting regular exercise. Doing deep-breathing exercises. Doing yoga. Meditating. What benefits will I see if I quit smoking? Over time, you may have: A better sense of smell and taste. Less coughing and sore throat. A slower heart rate. Lower blood pressure. Clearer skin. Better breathing. Fewer sick days. Summary Quitting smoking can be hard, but it is one of  the best things that you can do for your health. Do not give up if you cannot quit the first time. Some people need to try many times to quit. When you decide to quit smoking, make a plan to help you succeed. Quit smoking right away, not slowly over a period of time. When you start quitting, get help and support to keep you smoke-free. This information is not intended to replace advice given to you by your health care provider. Make sure you discuss any questions you have with your health care provider. Document Revised: 08/10/2021 Document Reviewed: 08/10/2021 Elsevier Patient Education  2023 [employer] Inc.  

## 2022-05-08 ENCOUNTER — Telehealth: Payer: Self-pay

## 2022-05-08 NOTE — Telephone Encounter (Signed)
Call received from the pt advising she has red blotches on her arms and chest, sore throat, mouth blisters and incontinence.  Discussed with Dr. Julien Nordmann who advises pt has been on Tecentriq for several years and her sx sound like an infection, therefore pt should be seen in Urgent care or by her PCP.  I have relayed this to the pt and she states all her sx have resolved except her incontinence. Pt was again advised to see her PCP or go to her nearest UC since she cannot come to the [facility] to be seen in [facility].

## 2022-05-14 ENCOUNTER — Other Ambulatory Visit: Payer: Medicare Other

## 2022-05-14 ENCOUNTER — Ambulatory Visit: Payer: Medicare Other | Admitting: Internal Medicine

## 2022-05-14 ENCOUNTER — Ambulatory Visit: Payer: Medicare Other

## 2022-05-14 ENCOUNTER — Encounter: Payer: Medicare Other | Admitting: Dietician

## 2022-05-20 NOTE — Progress Notes (Deleted)
[place], [place] [address]  DIAGNOSIS: Extensive stage (T2a, N3, [name], large right anterior mediastinal and supraclavicular lymphadenopathy as well as pancreatic and splenic metastasis diagnosed in September 2017.  The patient had disease progression in October 2019.  PRIOR THERAPY: 1) Systemic chemotherapy was carboplatin for AUC of 5 on day 1 and etoposide 100 MG/M2 on days 1, 2 and 3 with Neulasta support. Status post 6 cycles with significant response of her disease. 2) Prophylactic cranial irradiation under the care of Dr. Sondra Come on 12/02/2016. 3) stereotactic radiotherapy to the recurrent right upper lobe pulmonary nodule under the care of Dr. Sondra Come completed November 10, 2017. 4) Retreatment with systemic chemotherapy with carboplatin for AUC of 5 on day 1 and etoposide 100 mg/M2 on days 1, 2 and 3 as well as Tecentriq (Atezolizumab) 1200 mg IV every 3 weeks with Neulasta support.  First dose June 22, 2018 for disease recurrence.  Status post 5 cycles.  Starting from cycle #2 her dose of carboplatin will be reduced to AUC of 4 and etoposide 80 mg/M2 on days 1, 2 and 3 in addition to the regular dose of Tecentriq.  CURRENT THERAPY: Maintenance treatment with single agent Tecentriq 1200 mg IV every 3 weeks.  Status post 66 cycles.  INTERVAL HISTORY: Debra Kidd 61 y.o. female returns to clinic today for follow-up visit.  The patient was seen on 04/24/2022, she had elevated LFTs at that time.  She completed a Medrol Dosepak with improvement in her LFTs.  She then resumed cycle #66 on 05/02/2022.  Since last being seen, the patient denies any changes in her health.  She continues to report her persistent and baseline fatigue and generalized weakness.  Patient is followed closely by social work and the nutritionist team.  The patient is not very  active and does not eat or drink well.  She frequently receives complementary food items and supplemental protein drinks in our clinic.  In general, she tolerates Tecentriq well except for dry skin and itching.  She was previously prescribed Atarax but she has a lot of drowsiness and fatigue at baseline so she does not take these.  She uses hydrocortisone cream if needed.  Today she denies any fever, chills, or night sweats.  Weight?  Denies any nausea, vomiting, or constipation.  She sometimes has intermittent diarrhea at baseline.  Magnesium?  She reports based Dyspnea on exertion for which she continues to smoke 2 packs of cigarettes per day.  Denies any chest pain or hemoptysis.  She has baseline mild cough.  She is here today for evaluation and repeat blood work before considering cycle #67.     MEDICAL HISTORY: Past Medical History:  Diagnosis Date   Basal cell carcinoma of cheek    L side of face   Blood type, Rh positive    Cancer (HCC)    Chronic pain syndrome    Depression 08/07/2016   Encounter for antineoplastic chemotherapy 07/17/2016   History of external beam radiation therapy 11/19/16-12/02/16   brain 25 Gy in 10 fractions   Hyperlipidemia    Hypertension    Hypertension 08/07/2016   Osteoporosis    Small cell lung cancer ([place]) dx'd 05/2016    ALLERGIES:  is allergic to codeine, motrin [ibuprofen], thiazide-type diuretics, and vicodin [hydrocodone-acetaminophen].  MEDICATIONS:  Current Outpatient Medications  Medication Sig Dispense Refill  atorvastatin (LIPITOR) 80 MG tablet Take 1 tablet (80 mg total) by mouth daily. 30 tablet 11   clopidogrel (PLAVIX) 75 MG tablet Take 1 tablet (75 mg total) by mouth daily. 90 tablet 3   diazepam (VALIUM) 5 MG tablet Take 5 mg by mouth 3 (three) times daily as needed for anxiety.   2   lidocaine-prilocaine (EMLA) cream Apply 1 application. topically as needed. 30 g 2   potassium chloride 20 MEQ/15ML (10%) SOLN Take 15 mLs (20 mEq  total) by mouth daily. 100 mL 0   traMADol (ULTRAM) 50 MG tablet Take 1 tablet (50 mg total) by mouth 4 (four) times daily as needed for moderate pain (confirmed with her Pharmacy-Dr K. Little 03/15 she received #120). 30 tablet 0   Vitamin D, Ergocalciferol, [name],000 Units by mouth 3 (three) times a week.     No current facility-administered medications for this visit.    SURGICAL HISTORY:  Past Surgical History:  Procedure Laterality Date   ABDOMINAL HYSTERECTOMY  1981   APPENDECTOMY     at age 79   EYE SURGERY     left   IR GENERIC HISTORICAL  06/03/2016   IR FLUORO GUIDE PORT INSERTION RIGHT 06/03/2016 [facility]   IR GENERIC HISTORICAL  06/03/2016   IR US GUIDE VASC ACCESS RIGHT 06/03/2016 [facility]   SKIN CANCER EXCISION     basal cell carcinoma L side of face    REVIEW OF SYSTEMS:   Review of Systems  Constitutional: Negative for appetite change, chills, fatigue, [name], nosebleeds, sore throat and trouble swallowing.   Eyes: Negative for eye problems and icterus.  Respiratory: Negative for cough, hemoptysis, [name], constipation, diarrhea, nausea and vomiting.  Genitourinary: Negative for bladder incontinence, difficulty urinating, dysuria, frequency and hematuria.   Musculoskeletal: Negative for back pain, gait problem, neck pain and neck stiffness.  Skin: Negative for itching and rash.  Neurological: Negative for dizziness, extremity weakness, gait problem, headaches, [name], depression and sleep disturbance. The patient is not nervous/anxious.     PHYSICAL EXAMINATION:  There were no  vitals taken for this visit.  ECOG PERFORMANCE STATUS: {CHL ONC ECOG [id number]  Physical Exam  Constitutional: Oriented to person, place, and time and well-developed, well-nourished, and in no distress. No distress.  HENT:  Head: Normocephalic and atraumatic.  Mouth/Throat: Oropharynx is clear and moist. No oropharyngeal exudate.  Eyes: Conjunctivae are normal. Right eye exhibits no discharge. Left eye exhibits no discharge. No scleral icterus.  Neck: Normal range of motion. Neck supple.  Cardiovascular: Normal rate, regular rhythm, normal heart sounds and intact distal pulses.   Pulmonary/Chest: Effort normal and breath sounds normal. No respiratory distress. No wheezes. No rales.  Abdominal: Soft. Bowel sounds are normal. Exhibits no distension and no mass. There is no tenderness.  Musculoskeletal: Normal range of motion. Exhibits no edema.  Lymphadenopathy:    No cervical adenopathy.  Neurological: Alert and oriented to person, place, and time. Exhibits normal muscle tone. Gait normal. Coordination normal.  Skin: Skin is warm and dry. No rash noted. Not diaphoretic. No erythema. No pallor.  Psychiatric: Mood, memory and judgment normal.  Vitals reviewed.  LABORATORY DATA: Lab  Results  Component Value Date   WBC 11.0 (H) 05/02/2022   HGB 11.9 (L) 05/02/2022   HCT 34.2 (L) 05/02/2022   MCV 105.6 (H) 05/02/2022   PLT 348 05/02/2022      Chemistry      Component Value Date/Time   NA 137 05/02/2022 0845   NA 141 08/01/2017 0835   K 3.4 (L) 05/02/2022 0845   K 3.5 08/01/2017 0835   CL 102 05/02/2022 0845   CO2 31 05/02/2022 0845   CO2 25 08/01/2017 0835   BUN 6 (L) 05/02/2022 0845   BUN 8.0 08/01/2017 0835   CREATININE 0.59 05/02/2022 0845   CREATININE 0.9 08/01/2017 0835      Component Value Date/Time   CALCIUM 8.7 (L) 05/02/2022 0845   CALCIUM 9.9 08/01/2017 0835   ALKPHOS 256 (H) 05/02/2022 0845   ALKPHOS 142 08/01/2017 0835   AST 51 (H) 05/02/2022 0845   AST  15 08/01/2017 0835   ALT 65 (H) 05/02/2022 0845   ALT 19 08/01/2017 0835   BILITOT 0.5 05/02/2022 0845   BILITOT 0.35 08/01/2017 0835       RADIOGRAPHIC STUDIES:  CT Chest W Contrast  Result Date: 04/23/2022 CLINICAL DATA:  Small cell lung cancer, assess treatment response. * Tracking Code: BO * EXAM: CT CHEST, ABDOMEN, AND PELVIS WITH CONTRAST TECHNIQUE: Multidetector CT imaging of the chest, abdomen and pelvis was performed following the standard protocol during bolus administration of intravenous contrast. RADIATION DOSE REDUCTION: This exam was performed according to the departmental dose-optimization program which includes automated exposure control, adjustment of the mA and/or kV according to patient size and/or use of iterative reconstruction technique. CONTRAST:  172mL OMNIPAQUE IOHEXOL 300 MG/ML  SOLN COMPARISON:  01/24/2022. FINDINGS: CT CHEST FINDINGS Cardiovascular: Right IJ Port-A-Cath terminates at the SVC RA junction. Atherosclerotic calcification of the aorta and coronary arteries. Ulcerative plaque along the lateral transverse aorta. Heart size normal. No pericardial effusion. Mediastinum/Nodes: No pathologically enlarged mediastinal, hilar or axillary lymph nodes. Esophagus is unremarkable. Lungs/Pleura: Subpleural spiculated consolidation in the apical segment right upper lobe, unchanged. Subsegmental linear volume loss in the lower lobes. No suspicious pulmonary nodules. No pleural fluid. Airway is unremarkable. Musculoskeletal: No worrisome lytic or sclerotic lesions. Cervical and thoracic compression deformities, as before. CT ABDOMEN PELVIS FINDINGS Hepatobiliary: Liver and gallbladder are unremarkable. No biliary ductal dilatation. Pancreas: Negative. Spleen: Negative. Adrenals/Urinary Tract: Adrenal glands and kidneys are unremarkable. Ureters are decompressed. Bladder is grossly unremarkable. Stomach/Bowel: Stomach, small bowel and colon are unremarkable. Appendix is not  readily visualized. Vascular/Lymphatic: Atherosclerotic calcification of the aorta. No pathologically enlarged lymph nodes. Reproductive: Hysterectomy.  No adnexal mass. Other: No free fluid.  Mesenteries and peritoneum are unremarkable. Musculoskeletal: No worrisome lytic or sclerotic lesions. L2 compression fracture, unchanged. IMPRESSION: 1. Treated bronchogenic carcinoma in the right upper lobe. No evidence of recurrent or metastatic disease. 2. Aortic atherosclerosis (ICD10-I70.0). Coronary artery calcification. Electronically Signed   By: Lorin Picket M.D.   On: 04/23/2022 08:31   CT Abdomen Pelvis W Contrast  Result Date: 04/23/2022 CLINICAL DATA:  Small cell lung cancer, assess treatment response. * Tracking Code: BO * EXAM: CT CHEST, ABDOMEN, AND PELVIS WITH CONTRAST TECHNIQUE: Multidetector CT imaging of the chest, abdomen and pelvis was performed following the standard protocol during bolus administration of intravenous contrast. RADIATION DOSE REDUCTION: This exam was performed according to the departmental dose-optimization program which includes automated exposure control, adjustment of the mA and/or kV according to patient size and/or use of iterative  reconstruction technique. CONTRAST:  148mL OMNIPAQUE IOHEXOL 300 MG/ML  SOLN COMPARISON:  01/24/2022. FINDINGS: CT CHEST FINDINGS Cardiovascular: Right IJ Port-A-Cath terminates at the SVC RA junction. Atherosclerotic calcification of the aorta and coronary arteries. Ulcerative plaque along the lateral transverse aorta. Heart size normal. No pericardial effusion. Mediastinum/Nodes: No pathologically enlarged mediastinal, hilar or axillary lymph nodes. Esophagus is unremarkable. Lungs/Pleura: Subpleural spiculated consolidation in the apical segment right upper lobe, unchanged. Subsegmental linear volume loss in the lower lobes. No suspicious pulmonary nodules. No pleural fluid. Airway is unremarkable. Musculoskeletal: No worrisome lytic or  sclerotic lesions. Cervical and thoracic compression deformities, as before. CT ABDOMEN PELVIS FINDINGS Hepatobiliary: Liver and gallbladder are unremarkable. No biliary ductal dilatation. Pancreas: Negative. Spleen: Negative. Adrenals/Urinary Tract: Adrenal glands and kidneys are unremarkable. Ureters are decompressed. Bladder is grossly unremarkable. Stomach/Bowel: Stomach, small bowel and colon are unremarkable. Appendix is not readily visualized. Vascular/Lymphatic: Atherosclerotic calcification of the aorta. No pathologically enlarged lymph nodes. Reproductive: Hysterectomy.  No adnexal mass. Other: No free fluid.  Mesenteries and peritoneum are unremarkable. Musculoskeletal: No worrisome lytic or sclerotic lesions. L2 compression fracture, unchanged. IMPRESSION: 1. Treated bronchogenic carcinoma in the right upper lobe. No evidence of recurrent or metastatic disease. 2. Aortic atherosclerosis (ICD10-I70.0). Coronary artery calcification. Electronically Signed   By: Lorin Picket M.D.   On: 04/23/2022 08:31     ASSESSMENT/PLAN:  This is a very pleasant 61 year old Caucasian female with extensive stage small cell lung cancer.  She presented with a right upper lobe lung mass, large right anterior mediastinal and supraclavicular lymphadenopathy as well as a pancreatic and splenic metastasis.  She was diagnosed in September 2017.    The patient underwent systemic chemotherapy with carboplatin and etoposide.  She is status post 6 cycles.  She tolerated treatment well except for chemotherapy-induced anemia which required  pRBCs and platelet transfusions.  She had a significant improvement of her disease with chemotherapy. The patient then underwent prophylactic cranial irradiation. She then underwent stereotactic radiotherapy to the right upper lobe and pulmonary nodule.   She had been on observation for 2 years before showing evidence of disease progression.    She then was started on systemic  chemotherapy with carboplatin, etoposide, and Tecentriq. Starting from cycle #5, she has been on maintenance single agent Tecentriq.  She has been tolerating treatment well. She is status post 66  cycles of Tecentriq total.  At her appointment on 04/24/2022, the patient had elevated LFTs which improved after completing a Medrol Dosepak.  Today, the patient's CMP shows***also check potassium and LFTs***  Dr. Julien Nordmann recommends that she proceed with cycle #67 today scheduled.  We will see her back for follow-up visit in 3 weeks for evaluation and repeat blood work before undergoing cycle #68.  Reiterated strategies to improve her nutritional status which likely contributes to her fatigue.  She also does not take great care of her general health.   She has been seen by member the nutritionist team in the past.  Discussed that she finds the boost and Ensure to be too thick but she can water this down with ice cubes, skin milk, etc.   Trauma encouraged the patient to quit smoking.  The patient would likely have improvement in her appetite and her breathing if she quit.  Patient is not interested in quitting at this time.  Dr. Julien Nordmann encouraged the patient to discuss being referred to a pain clinic with her PCP for her chronic back pain.   Guarding her itching, the patient  will continue to use topical hydrocortisone cream.  The Atarax and Benadryl make her too drowsy.  Encouraged her to take a Claritin or Zyrtec which will hopefully help somewhat with the itching and cause less drowsiness.  The patient was advised to call immediately if she has any concerning symptoms in the interval. The patient voices understanding of current disease status and treatment options and is in agreement with the current care plan. All questions were answered. The patient knows to call the clinic with any problems, questions or concerns. We can certainly see the patient much sooner if necessary     No orders of the  defined types were placed in this encounter.    I spent {CHL ONC TIME VISIT - DXFPK:[id number]} counseling the patient face to face. The total time spent in the appointment was {CHL ONC TIME VISIT - UDODQ:[id number]}.  Schwanda Zima L Rosie Torrez, PA-C 05/20/22

## 2022-05-21 ENCOUNTER — Telehealth: Payer: Self-pay

## 2022-05-21 NOTE — Telephone Encounter (Signed)
Pt LVM stating she needs to cancel her appt tomorrow 05/22/22, stating she has been exposed to [place] and is not feeling well.  I discussed with Dr. Julien Nordmann if it was okay for pt to miss her treatment this month or if he wants the pt tx delayed a few days. Dr. Julien Nordmann advised that he wants the pt to get COVID tested and if it positive, she can skip her 05/23/22 treatment. He states if pt is not positive, we can delay her tx a few days.   I have called the pt back and she states she does not have a car to go get COVID tested and she does not have a home test kit. I advised pt to contact her PCP for an appointment but she declines to do so advising she doesn't like her PCP and again states, she does not have a way to get there. I advised the pt she does not need to travel to her PCP office as they can do a telephone appt for her and hopefully get her a rx of Paxlovid. Pt them stated she doesn't have money for the rx. She further states she doesn't "need to see anyone to know she has COVID". She states "I know I have COVID because I feel like I do and the others who are sick tested positive". I again encouraged pt to contact her PCP for a telephone visit in an effort to obtain a rx for Paxlovid but pt declines and states she's "just going to have to ride it out".  I have canceled her 05/23/22 appts and advised the pt we will see her at her next appt on 06/12/22 at 8am. Pt expressed understanding of this.

## 2022-05-22 ENCOUNTER — Inpatient Hospital Stay: Payer: Medicare Other | Admitting: Dietician

## 2022-05-22 ENCOUNTER — Inpatient Hospital Stay: Payer: Medicare Other | Admitting: Physician Assistant

## 2022-05-22 ENCOUNTER — Inpatient Hospital Stay: Payer: Medicare Other

## 2022-06-05 ENCOUNTER — Ambulatory Visit: Payer: Medicare Other

## 2022-06-05 ENCOUNTER — Other Ambulatory Visit: Payer: Medicare Other

## 2022-06-05 ENCOUNTER — Ambulatory Visit: Payer: Medicare Other | Admitting: Internal Medicine

## 2022-06-06 ENCOUNTER — Other Ambulatory Visit: Payer: Self-pay

## 2022-06-07 ENCOUNTER — Other Ambulatory Visit: Payer: Self-pay

## 2022-06-12 ENCOUNTER — Other Ambulatory Visit: Payer: Self-pay | Admitting: Physician Assistant

## 2022-06-12 ENCOUNTER — Telehealth: Payer: Self-pay | Admitting: *Deleted

## 2022-06-12 ENCOUNTER — Inpatient Hospital Stay: Payer: Medicare Other

## 2022-06-12 ENCOUNTER — Telehealth: Payer: Self-pay | Admitting: Medical Oncology

## 2022-06-12 ENCOUNTER — Inpatient Hospital Stay: Payer: Medicare Other | Admitting: Internal Medicine

## 2022-06-12 ENCOUNTER — Inpatient Hospital Stay: Payer: Medicare Other | Admitting: Dietician

## 2022-06-12 ENCOUNTER — Other Ambulatory Visit: Payer: Self-pay

## 2022-06-12 NOTE — Telephone Encounter (Signed)
COVID test reported to be negative. " I took a stupid COVID test ad guess what , it was negative"

## 2022-06-12 NOTE — Telephone Encounter (Signed)
RN received phone call from [place] in registration.  Deanna stated patient told front desk she felt like she had Covid.  RN discussed with Cassie Heilingoetter, PA the appropriate next steps.  Per Cassie, patient was suppose to take a home Covid test on 05/21/22.  Cassie recommended patient go home and take a home Covid test.  RN relayed information to patient.  Patient responded she was going home but not taking a Covid test.  RN reminded patient it is important to know if she has Covid, and we can reschedule appointment based on results.  Deanna in registration called husband to inform him of the situation.  Cassie updated with patient's response.

## 2022-06-17 ENCOUNTER — Telehealth: Payer: Self-pay | Admitting: Physician Assistant

## 2022-06-17 ENCOUNTER — Other Ambulatory Visit: Payer: Self-pay | Admitting: Physician Assistant

## 2022-06-17 NOTE — Telephone Encounter (Signed)
Contacted patient to scheduled appointments. Patient is aware of appointments that are scheduled.   

## 2022-06-18 ENCOUNTER — Other Ambulatory Visit: Payer: Self-pay

## 2022-06-19 NOTE — Progress Notes (Deleted)
[place], [address] [place] [address]  DIAGNOSIS:  Extensive stage (T2a, N3, [name], large right anterior mediastinal and supraclavicular lymphadenopathy as well as pancreatic and splenic metastasis diagnosed in September 2017.  The patient had disease progression in October 2019.  PRIOR THERAPY: 1)) Systemic chemotherapy was carboplatin for AUC of 5 on day 1 and etoposide 100 MG/M2 on days 1, 2 and 3 with Neulasta support. Status post 6 cycles with significant response of her disease. 2) Prophylactic cranial irradiation under the care of Dr. Sondra Come on 12/02/2016. 3) stereotactic radiotherapy to the recurrent right upper lobe pulmonary nodule under the care of Dr. Sondra Come completed November 10, 2017. 4) Retreatment with systemic chemotherapy with carboplatin for AUC of 5 on day 1 and etoposide 100 mg/M2 on days 1, 2 and 3 as well as Tecentriq (Atezolizumab) 1200 mg IV every 3 weeks with Neulasta support.  First dose June 22, 2018 for disease recurrence.  Status post 5 cycles.  Starting from cycle #2 her dose of carboplatin will be reduced to AUC of 4 and etoposide 80 mg/M2 on days 1, 2 and 3 in addition to the regular dose of Tecentriq.  CURRENT THERAPY: Maintenance treatment with single agent Tecentriq 1200 mg IV every 3 weeks.  Status post 66 cycles.  INTERVAL HISTORY: Debra Kidd 61 y.o. female returns to clinic today for follow-up visit.  The patient was last treated on 8/31/thousand 23.  The patient called in September 2023 endorsing that she is certain that she has COVID as she had exposures and had symptoms.  However she refused to get a COVID test.  She then showed up for her appointment on 06/12/2022.  She was endorsing COVID-like symptoms on entering the cancer center and stating that she feels like she has COVID.  Therefore the patient was instructed  to return home and take a COVID test.  The patient initially refused to take the COVID test.  She eventually took a COVID test and called back stating "I took a stupid COVID test and gas what it was negative".   Since last being seen, the patient states her nasal congestion and ***has***.  Today she denies any fever, chills, night sweats, or oxamide weight loss.  The patient does have baseline persistent fatigue, generalized weakness, and decreased appetite.  The patient was previously evaluated by member the nutritionist team and she often receives complementary food items and supplemental protein drinks.  The patient generally does not take good care of her health and does not follow recommendations that are outlined.  The patient continues to smoke cigarettes.  She is prescribed pain medication by her PCP for her chronic back pain.  Today she is here to resume her treatment Tecentriq which she generally tolerates fairly well except for dry skin and itching.  A few weeks ago she did have elevated LFTs.  She denies any nausea, vomiting, or constipation.  She sometimes has intermittent diarrhea at baseline.  She often has hypokalemia on labs.  She denies any headache or visual changes.  She is here today for evaluation to resume her treatment cycle #67.     MEDICAL HISTORY: Past Medical History:  Diagnosis Date   Basal cell carcinoma of cheek    L side of face   Blood type, Rh positive    Cancer (HCC)    Chronic pain syndrome  Depression 08/07/2016   Encounter for antineoplastic chemotherapy 07/17/2016   History of external beam radiation therapy 11/19/16-12/02/16   brain 25 Gy in 10 fractions   Hyperlipidemia    Hypertension    Hypertension 08/07/2016   Osteoporosis    Small cell lung cancer ([facility]) dx'd 05/2016    ALLERGIES:  is allergic to codeine, motrin [ibuprofen], thiazide-type diuretics, and vicodin [hydrocodone-acetaminophen].  MEDICATIONS:  Current Outpatient Medications  Medication  Sig Dispense Refill   atorvastatin (LIPITOR) 80 MG tablet Take 1 tablet (80 mg total) by mouth daily. 30 tablet 11   clopidogrel (PLAVIX) 75 MG tablet Take 1 tablet (75 mg total) by mouth daily. 90 tablet 3   diazepam (VALIUM) 5 MG tablet Take 5 mg by mouth 3 (three) times daily as needed for anxiety.   2   lidocaine-prilocaine (EMLA) cream Apply 1 application. topically as needed. 30 g 2   potassium chloride 20 MEQ/15ML (10%) SOLN Take 15 mLs (20 mEq total) by mouth daily. 100 mL 0   traMADol (ULTRAM) 50 MG tablet Take 1 tablet (50 mg total) by mouth 4 (four) times daily as needed for moderate pain (confirmed with her Pharmacy-Dr K. Little 03/15 she received #120). 30 tablet 0   Vitamin D, Ergocalciferol, [name],000 Units by mouth 3 (three) times a week.     No current facility-administered medications for this visit.    SURGICAL HISTORY:  Past Surgical History:  Procedure Laterality Date   ABDOMINAL HYSTERECTOMY  1981   APPENDECTOMY     at age 40   EYE SURGERY     left   IR GENERIC HISTORICAL  06/03/2016   IR FLUORO GUIDE PORT INSERTION RIGHT 06/03/2016 [facility]   IR GENERIC HISTORICAL  06/03/2016   IR US GUIDE VASC ACCESS RIGHT 06/03/2016 [facility]   SKIN CANCER EXCISION     basal cell carcinoma L side of face    REVIEW OF SYSTEMS:   Review of Systems  Constitutional: Negative for appetite change, chills, fatigue, [name], nosebleeds, sore throat and trouble swallowing.   Eyes: Negative for eye problems and icterus.  Respiratory: Negative for cough, hemoptysis, [name], constipation, diarrhea, nausea and vomiting.  Genitourinary: Negative for bladder incontinence, difficulty urinating, dysuria, frequency and hematuria.    Musculoskeletal: Negative for back pain, gait problem, neck pain and neck stiffness.  Skin: Negative for itching and rash.  Neurological: Negative for dizziness, extremity weakness, gait problem, headaches, [name], depression and sleep disturbance. The patient is not nervous/anxious.     PHYSICAL EXAMINATION:  There were no vitals taken for this visit.  ECOG PERFORMANCE STATUS: {CHL ONC ECOG [id number]  Physical Exam  Constitutional: Oriented to person, place, and time and well-developed, well-nourished, and in no distress. No distress.  HENT:  Head: Normocephalic and atraumatic.  Mouth/Throat: Oropharynx is clear and moist. No oropharyngeal exudate.  Eyes: Conjunctivae are normal. Right eye exhibits no discharge. Left eye exhibits no discharge. No scleral icterus.  Neck: Normal range of motion. Neck supple.  Cardiovascular: Normal rate, regular rhythm, normal heart sounds and intact distal pulses.   Pulmonary/Chest: Effort normal and breath sounds normal. No respiratory distress. No wheezes. No rales.  Abdominal:  Soft. Bowel sounds are normal. Exhibits no distension and no mass. There is no tenderness.  Musculoskeletal: Normal range of motion. Exhibits no edema.  Lymphadenopathy:    No cervical adenopathy.  Neurological: Alert and oriented to person, place, and time. Exhibits normal muscle tone. Gait normal. Coordination normal.  Skin: Skin is warm and dry. No rash noted. Not diaphoretic. No erythema. No pallor.  Psychiatric: Mood, memory and judgment normal.  Vitals reviewed.  LABORATORY DATA: Lab Results  Component Value Date   WBC 11.0 (H) 05/02/2022   HGB 11.9 (L) 05/02/2022   HCT 34.2 (L) 05/02/2022   MCV 105.6 (H) 05/02/2022   PLT 348 05/02/2022      Chemistry      Component Value Date/Time   NA 137 05/02/2022 0845   NA  141 08/01/2017 0835   K 3.4 (L) 05/02/2022 0845   K 3.5 08/01/2017 0835   CL 102 05/02/2022 0845   CO2 31 05/02/2022 0845   CO2 25 08/01/2017 0835   BUN 6 (L) 05/02/2022 0845   BUN 8.0 08/01/2017 0835   CREATININE 0.59 05/02/2022 0845   CREATININE 0.9 08/01/2017 0835      Component Value Date/Time   CALCIUM 8.7 (L) 05/02/2022 0845   CALCIUM 9.9 08/01/2017 0835   ALKPHOS 256 (H) 05/02/2022 0845   ALKPHOS 142 08/01/2017 0835   AST 51 (H) 05/02/2022 0845   AST 15 08/01/2017 0835   ALT 65 (H) 05/02/2022 0845   ALT 19 08/01/2017 0835   BILITOT 0.5 05/02/2022 0845   BILITOT 0.35 08/01/2017 0835       RADIOGRAPHIC STUDIES:  No results found.   ASSESSMENT/PLAN:  This is a very pleasant 61 year old Caucasian female with extensive stage small cell lung cancer.  She presented with a right upper lobe lung mass, large right anterior mediastinal and supraclavicular lymphadenopathy as well as a pancreatic and splenic metastasis.  She was diagnosed in September 2017.    The patient underwent systemic chemotherapy with carboplatin and etoposide.  She is status post 6 cycles.  She tolerated treatment well except for chemotherapy-induced anemia which required  pRBCs and platelet transfusions.  She had a significant improvement of her disease with chemotherapy. The patient then underwent prophylactic cranial irradiation. She then underwent stereotactic radiotherapy to the right upper lobe and pulmonary nodule.   She had been on observation for 2 years before showing evidence of disease progression.    She then was started on systemic chemotherapy with carboplatin, etoposide, and Tecentriq. Starting from cycle #5, she has been on maintenance single agent Tecentriq.  She has been tolerating treatment well. She is status post 66  cycles of Tecentriq total.  The patient was off treatment for a few weeks while she recovered from a presumed COVID-19 infection.  The patient was seen with Dr. Julien Nordmann  today.  Labs were reviewed.  Recommend that she resume treatment as scheduled.  Check LFTs?  We will see her back for follow-up visit in 3 weeks for evaluation repeat blood work before undergoing cycle #67.  The patient was previously strongly encouraged to quit smoking.  The patient is not interested in quitting smoking at this time.  The patient will continue her pain management with her PCP for chronic back pain.    Guarding her itching, the patient will continue to use topical hydrocortisone cream.  The Atarax and Benadryl make her too drowsy.  Encouraged her to take a Claritin or Zyrtec which will hopefully help somewhat with the itching  and cause less drowsiness.  The patient was advised to call immediately if she has any concerning symptoms in the interval. The patient voices understanding of current disease status and treatment options and is in agreement with the current care plan. All questions were answered. The patient knows to call the clinic with any problems, questions or concerns. We can certainly see the patient much sooner if necessary         No orders of the defined types were placed in this encounter.    I spent {CHL ONC TIME VISIT - NIOEV:[id number]} counseling the patient face to face. The total time spent in the appointment was {CHL ONC TIME VISIT - EXHBZ:[id number]}.  Jorryn Hershberger L Tashya Alberty, PA-C 06/19/22

## 2022-06-20 ENCOUNTER — Inpatient Hospital Stay: Payer: Medicare Other | Attending: Internal Medicine

## 2022-06-20 ENCOUNTER — Inpatient Hospital Stay: Payer: Medicare Other

## 2022-06-20 ENCOUNTER — Inpatient Hospital Stay: Payer: Medicare Other | Admitting: Physician Assistant

## 2022-06-23 ENCOUNTER — Encounter (HOSPITAL_COMMUNITY): Payer: Self-pay

## 2022-06-23 ENCOUNTER — Emergency Department (HOSPITAL_COMMUNITY)
Admission: EM | Admit: 2022-06-23 | Discharge: 2022-06-23 | Disposition: A | Discharge status: Home / Self Care | Payer: Medicare Other | Attending: Emergency Medicine | Admitting: Emergency Medicine

## 2022-06-23 DIAGNOSIS — L089 Local infection of the skin and subcutaneous tissue, unspecified: Secondary | ICD-10-CM

## 2022-06-23 DIAGNOSIS — L720 Epidermal cyst: Secondary | ICD-10-CM | POA: Diagnosis present

## 2022-06-23 DIAGNOSIS — I10 Essential (primary) hypertension: Secondary | ICD-10-CM | POA: Insufficient documentation

## 2022-06-23 DIAGNOSIS — Z85118 Personal history of other malignant neoplasm of bronchus and lung: Secondary | ICD-10-CM | POA: Insufficient documentation

## 2022-06-23 MED ORDER — LIDOCAINE-EPINEPHRINE (PF) 2 %-1:200000 IJ SOLN
10.0000 mL | Freq: Once | INTRAMUSCULAR | Status: AC
  Administered 2022-06-23: 10 mL
  Filled 2022-06-23: qty 20

## 2022-06-23 MED ORDER — DOXYCYCLINE HYCLATE 100 MG PO CAPS: 100.0000 mg | ORAL_CAPSULE | Freq: Two times a day (BID) | ORAL | 0 refills | Status: AC

## 2022-06-23 NOTE — ED Triage Notes (Signed)
Pt presents with c/o possible abscess on her back. Pt does have a raised area on her back, no drainage noted at this time. Pt is a cancer pt, has missed her last few chemo treatments as family reports their household had Covid.

## 2022-06-23 NOTE — Discharge Instructions (Signed)
Please take antibiotics as prescribed, warm compresses daily, make sure you are drinking plenty of water take your prescribed medications and take antibiotics for the entire course.  Please take all of your antibiotics until finished!   You may develop abdominal discomfort or diarrhea from the antibiotic.  You may help offset this with probiotics which you can buy or get in yogurt. Do not eat  or take the probiotics until 2 hours after your antibiotic.   Tylenol 1000 mg every 6 hours for pain.  Please have this wound rechecked in 48 to 72 hours.

## 2022-06-23 NOTE — ED Provider Notes (Signed)
[facility] DEPT Provider Note   CSN: [id number] Arrival date & time: 06/23/22  1300     History  Chief Complaint  Patient presents with   Abscess    Debra Kidd is a 61 y.o. female.  Patient is a 60 year old female with past medical history significant for   Abscess  Patient is a 61 year old female with past medical history significant for osteoporosis, HTN, depression, chronic pain, HLD HTN, cancer/small cell lung cancer  Patient presented emergency room today with complaints of cyst that has been located in her back for "many years "however it seems that over the past week it has become more red and irritated and painful.  She denies any fevers or chills nausea vomiting.  Denies any chest pain difficulty breathing.  No other associate symptoms.  She does not take any medications for this.  She has not been evaluated for this either.    Home Medications Prior to Admission medications   Medication Sig Start Date End Date Taking? Authorizing Provider  doxycycline (VIBRAMYCIN) 100 MG capsule Take 1 capsule (100 mg total) by mouth 2 (two) times daily for 7 days. 06/23/22 06/30/22 Yes Ryett Hamman S, PA  atorvastatin (LIPITOR) 80 MG tablet Take 1 tablet (80 mg total) by mouth daily. 02/04/22   Frann Rider, NP  clopidogrel (PLAVIX) 75 MG tablet Take 1 tablet (75 mg total) by mouth daily. 02/04/22   Frann Rider, NP  diazepam (VALIUM) 5 MG tablet Take 5 mg by mouth 3 (three) times daily as needed for anxiety.  03/24/18   [provider]  lidocaine-prilocaine (EMLA) cream Apply 1 application. topically as needed. 11/28/21   Heilingoetter, Cassandra L, PA-C  potassium chloride 20 MEQ/15ML (10%) SOLN Take 15 mLs (20 mEq total) by mouth daily. 01/09/22   Heilingoetter, Cassandra L, PA-C  traMADol (ULTRAM) 50 MG tablet Take 1 tablet (50 mg total) by mouth 4 (four) times daily as needed for moderate pain (confirmed with her Pharmacy-Dr K. Little  03/15 she received #120). 01/09/22   Heilingoetter, Cassandra L, PA-C  Vitamin D, Ergocalciferol, [name],000 Units by mouth 3 (three) times a week. 04/22/22   [provider]      Allergies    Codeine, Motrin [ibuprofen], [name], and Vicodin [hydrocodone-acetaminophen]    Review of Systems   Review of Systems  Physical Exam Updated Vital Signs BP 98/79 (BP Location: Left Arm)   Pulse (!) 101   Temp 98 F (36.7 C) (Oral)   Resp 18   Ht 5\' 3"  (1.6 m)   Wt 44.9 kg   SpO2 96%   BMI 17.54 kg/m  Physical Exam Vitals and nursing note reviewed.  Constitutional:      General: She is not in acute distress.    Appearance: Normal appearance. She is not ill-appearing.  HENT:     Head: Normocephalic and atraumatic.  Eyes:     General: No scleral icterus.       Right eye: No discharge.        Left eye: No discharge.     Conjunctiva/sclera: Conjunctivae normal.  Pulmonary:     Effort: Pulmonary effort is normal.     Breath sounds: No stridor.  Skin:    Comments: 3 x 2 cm oval-shaped cyst located just right of midline of back.  It is nonfluctuant.  There is overlying erythema consistent with infected cyst  Neurological:     Mental Status: She  is alert and oriented to person, place, and time. Mental status is at baseline.      ED Results / Procedures / Treatments   Labs (all labs ordered are listed, but only abnormal results are displayed) Labs Reviewed - No data to display  EKG None  Radiology No results found.  Procedures .Marland KitchenIncision and Drainage  Date/Time: 06/23/2022 5:36 PM  Performed by: Tedd Sias, PA Authorized by: Tedd Sias, PA   Consent:    Consent obtained:  Verbal   Consent given by:  Patient   Risks discussed:  Bleeding, incomplete drainage, pain and damage to other organs   Alternatives discussed:  No treatment Universal protocol:    Procedure explained and questions  answered to patient or proxy's satisfaction: yes     Relevant documents present and verified: yes     Test results available : yes     Imaging studies available: yes     Required blood products, implants, devices, and special equipment available: yes     Site/side marked: yes     Immediately prior to procedure, a time out was called: yes     Patient identity confirmed:  Verbally with patient and arm band Location:    Indications for incision and drainage: Infected cutaneous cyst.   Size:  2x3 cm   Location: Posterior thorax. Pre-procedure details:    Skin preparation:  Chlorhexidine with alcohol Anesthesia:    Anesthesia method:  Local infiltration   Local anesthetic:  Lidocaine 2% WITH epi Procedure type:    Complexity:  Complex Procedure details:    Incision types:  Single straight   Incision depth:  Subcutaneous   Wound management:  Probed and deloculated, irrigated with saline and extensive cleaning   Drainage:  Purulent   Drainage amount:  Scant Post-procedure details:    Procedure completion:  Tolerated well, no immediate complications Comments:     Scant purulent drainage expressed from wound and thick cyst material expressed as well.  Cyst wall sac was also removed to the best my ability.  Cavity was irrigated and packed lightly with iodoform gauze.  Dressing placed.     Medications Ordered in ED Medications  lidocaine-EPINEPHrine (XYLOCAINE W/EPI) 2 %-1:200000 (PF) injection 10 mL (10 mLs Infiltration Given by Other 06/23/22 1539)    ED Course/ Medical Decision Making/ A&P                           Medical Decision Making Risk Prescription drug management.   Patient is a 61 year old female with past medical history significant for osteoporosis, HTN, depression, chronic pain, HLD HTN, cancer/small cell lung cancer  Patient presented emergency room today with complaints of cyst that has been located in her back for "many years "however it seems that over the past  week it has become more red and irritated and painful.  She denies any fevers or chills nausea vomiting.  Denies any chest pain difficulty breathing.  No other associate symptoms.  She does not take any medications for this.  She has not been evaluated for this either.  Patient agreed to incision and drainage.  She tolerated this procedure well.  Cyst wall was partially removed and cyst contents were completely removed.  There was a scant amount of purulent drainage from the cyst as well.  We will place on doxycycline, recommend close follow-up with PCP.  Recommend 48 to 72-hour follow-up for wound recheck.  Return precautions discussed.  All  questions answered to the best my ability  Patient's heart rate is between 85 and 95 at time of discharge.  No fever.  She is agreeable to plan.  Feels much improved  Final Clinical Impression(s) / ED Diagnoses Final diagnoses:  Infected cyst of skin    Rx / DC Orders ED Discharge Orders          Ordered    doxycycline (VIBRAMYCIN) 100 MG capsule  2 times daily        06/23/22 1515              Pati Gallo Fox, [place] 06/23/22 1738    Milton Ferguson, MD 06/24/22 1023

## 2022-06-25 ENCOUNTER — Encounter (HOSPITAL_COMMUNITY): Payer: Self-pay

## 2022-06-25 ENCOUNTER — Emergency Department (HOSPITAL_COMMUNITY)
Admission: EM | Admit: 2022-06-25 | Discharge: 2022-06-25 | Disposition: A | Discharge status: Home / Self Care | Payer: Medicare Other | Attending: Emergency Medicine | Admitting: Emergency Medicine

## 2022-06-25 ENCOUNTER — Other Ambulatory Visit (HOSPITAL_COMMUNITY): Payer: Self-pay

## 2022-06-25 DIAGNOSIS — Z4800 Encounter for change or removal of nonsurgical wound dressing: Secondary | ICD-10-CM | POA: Diagnosis present

## 2022-06-25 DIAGNOSIS — Z85118 Personal history of other malignant neoplasm of bronchus and lung: Secondary | ICD-10-CM | POA: Insufficient documentation

## 2022-06-25 DIAGNOSIS — Z7902 Long term (current) use of antithrombotics/antiplatelets: Secondary | ICD-10-CM | POA: Insufficient documentation

## 2022-06-25 DIAGNOSIS — Z5189 Encounter for other specified aftercare: Secondary | ICD-10-CM

## 2022-06-25 MED ORDER — SULFAMETHOXAZOLE-TRIMETHOPRIM 800-160 MG PO TABS: 1.0000 | ORAL_TABLET | Freq: Two times a day (BID) | ORAL | 0 refills | Status: AC

## 2022-06-25 MED ORDER — CEPHALEXIN 500 MG PO CAPS: 500.0000 mg | ORAL_CAPSULE | Freq: Two times a day (BID) | ORAL | 0 refills | Status: DC

## 2022-06-25 NOTE — ED Triage Notes (Signed)
Pt arrived via POV, requesting wound check. States recently had cyst drained, wants to make sure site looks okay. Denies any fevers. States abx prescribed has made her vomit.

## 2022-06-25 NOTE — ED Provider Notes (Signed)
[facility] DEPT Provider Note   CSN: [id number] Arrival date & time: 06/25/22  1312     History  Chief Complaint  Patient presents with   Wound Check    Debra Kidd is a 61 y.o. female with medical history of non-small cell lung cancer.  The patient reports to the ED for evaluation of wound check.  Patient reports that 2 days ago she had a cyst on her back drained and removed by PA-C.  The patient returns today requesting wound check.  Patient denies any fevers, diarrhea.  Patient reports she has been unable to tolerate her oral antibiotic doxycycline due to nausea and vomiting.  Patient with toxic in appearance.  Patient states that 1 feels much better than when she was evaluated 2 days ago.  Patient reports she has been caring for wound at home by covering the wound.   Wound Check       Home Medications Prior to Admission medications   Medication Sig Start Date End Date Taking? Authorizing Provider  sulfamethoxazole-trimethoprim (BACTRIM DS) 800-160 MG tablet Take 1 tablet by mouth 2 (two) times daily for 7 days. 06/25/22 07/02/22 Yes Azucena Cecil, PA-C  atorvastatin (LIPITOR) 80 MG tablet Take 1 tablet (80 mg total) by mouth daily. 02/04/22   Frann Rider, NP  clopidogrel (PLAVIX) 75 MG tablet Take 1 tablet (75 mg total) by mouth daily. 02/04/22   Frann Rider, NP  diazepam (VALIUM) 5 MG tablet Take 5 mg by mouth 3 (three) times daily as needed for anxiety.  03/24/18   [provider]  doxycycline (VIBRAMYCIN) 100 MG capsule Take 1 capsule (100 mg total) by mouth 2 (two) times daily for 7 days. 06/23/22 06/30/22  Tedd Sias, PA  lidocaine-prilocaine (EMLA) cream Apply 1 application. topically as needed. 11/28/21   Heilingoetter, Cassandra L, PA-C  potassium chloride 20 MEQ/15ML (10%) SOLN Take 15 mLs (20 mEq total) by mouth daily. 01/09/22   Heilingoetter, Cassandra L, PA-C  traMADol (ULTRAM) 50 MG tablet Take 1 tablet (50 mg  total) by mouth 4 (four) times daily as needed for moderate pain (confirmed with her Pharmacy-Dr K. Little 03/15 she received #120). 01/09/22   Heilingoetter, Cassandra L, PA-C  Vitamin D, Ergocalciferol, [name],000 Units by mouth 3 (three) times a week. 04/22/22   [provider]      Allergies    Codeine, Motrin [ibuprofen], [name], and Vicodin [hydrocodone-acetaminophen]    Review of Systems   Review of Systems  Constitutional:  Negative for fever.  Skin:  Positive for wound.  All other systems reviewed and are negative.   Physical Exam Updated Vital Signs BP 97/75   Pulse 91   Temp 98 F (36.7 C) (Oral)   Resp 18   Ht 5\' 3"  (1.6 m)   Wt 44.9 kg   SpO2 96%   BMI 17.54 kg/m  Physical Exam Vitals and nursing note reviewed.  Constitutional:      General: She is not in acute distress.    Appearance: She is well-developed.  HENT:     Head: Normocephalic and atraumatic.  Eyes:     Conjunctiva/sclera: Conjunctivae normal.  Cardiovascular:     Rate and Rhythm: Normal rate and regular rhythm.     Heart sounds: No murmur heard. Pulmonary:     Effort: Pulmonary effort is normal. No respiratory distress.     Breath sounds: Normal breath sounds.  Abdominal:  Palpations: Abdomen is soft.     Tenderness: There is no abdominal tenderness.  Musculoskeletal:        General: No swelling.     Cervical back: Neck supple.  Skin:    General: Skin is warm and dry.     Capillary Refill: Capillary refill takes less than 2 seconds.     Comments: Well-healing wound to the right of midline.  Packing removed, no drainage noted.  No purulence expressed.  Neurological:     Mental Status: She is alert and oriented to person, place, and time.  Psychiatric:        Mood and Affect: Mood normal.      ED Results / Procedures / Treatments   Labs (all labs ordered are listed, but only abnormal results are displayed) Labs  Reviewed - No data to display  EKG None  Radiology No results found.  Procedures Procedures   Medications Ordered in ED Medications - No data to display  ED Course/ Medical Decision Making/ A&P                           Medical Decision Making  61 year old female presents to the ED for wound check.  Please see HPI for further details.  On my examination the patient is a 3 x 2 cm wound to the right upper midline.  There is minimal erythema.  There is no purulence or drainage noted from the wound.  The wound is well-appearing.  Packing was removed, wound was flushed copiously with normal saline.  Patient then had wound recovered.  Patient will have antibiotic switched to Bactrim twice daily.  Patient kidney function based on recent labs seems to be normal.  Patient case discussed with my attending Dr. Doren Custard who voices agreement with plan of management.  Patient advised to return to the ED with any new symptoms such as fevers or worsening of pain to her incision site.  Patient voices understanding of my instructions.  The patient had all her questions answered her satisfaction.  The patient stable at this time for discharge home.  Final Clinical Impression(s) / ED Diagnoses Final diagnoses:  Visit for wound check    Rx / DC Orders ED Discharge Orders          Ordered    cephALEXin (KEFLEX) 500 MG capsule  2 times daily,   Status:  Discontinued        06/25/22 1401    sulfamethoxazole-trimethoprim (BACTRIM DS) 800-160 MG tablet  2 times daily        06/25/22 1405              Azucena Cecil, PA-C 06/25/22 1407    Godfrey Pick, MD 06/25/22 1906

## 2022-06-25 NOTE — Discharge Instructions (Addendum)
Return to ED with any new symptoms just fevers, increased pain to incision site on back Please follow-up with PCP for further management of wound Please continue keeping the wound dressed and dry. Please begin taking antibiotic I placed you on.  You will take this twice daily for the next 10 days.

## 2022-06-26 ENCOUNTER — Other Ambulatory Visit: Payer: Medicare Other

## 2022-06-26 ENCOUNTER — Ambulatory Visit: Payer: Medicare Other | Admitting: Physician Assistant

## 2022-06-26 ENCOUNTER — Ambulatory Visit: Payer: Medicare Other

## 2022-07-01 ENCOUNTER — Other Ambulatory Visit: Payer: Self-pay | Admitting: Medical Oncology

## 2022-07-01 NOTE — Progress Notes (Signed)
Appts confirmed with pt.

## 2022-07-03 ENCOUNTER — Inpatient Hospital Stay: Payer: Medicare Other

## 2022-07-03 ENCOUNTER — Inpatient Hospital Stay: Payer: Medicare Other | Admitting: Physician Assistant

## 2022-07-09 ENCOUNTER — Other Ambulatory Visit: Payer: Self-pay

## 2022-07-11 ENCOUNTER — Inpatient Hospital Stay: Payer: Medicare Other | Attending: Internal Medicine | Admitting: Internal Medicine

## 2022-07-11 ENCOUNTER — Other Ambulatory Visit: Payer: Self-pay

## 2022-07-11 ENCOUNTER — Inpatient Hospital Stay: Payer: Medicare Other

## 2022-07-11 ENCOUNTER — Inpatient Hospital Stay: Payer: Medicare Other | Admitting: Dietician

## 2022-07-11 VITALS — BP 117/65 | HR 82 | Temp 98.1°F | Resp 18 | Wt 96.4 lb

## 2022-07-11 DIAGNOSIS — I10 Essential (primary) hypertension: Secondary | ICD-10-CM | POA: Insufficient documentation

## 2022-07-11 DIAGNOSIS — C3491 Malignant neoplasm of unspecified part of right bronchus or lung: Secondary | ICD-10-CM

## 2022-07-11 DIAGNOSIS — Z7902 Long term (current) use of antithrombotics/antiplatelets: Secondary | ICD-10-CM | POA: Diagnosis not present

## 2022-07-11 DIAGNOSIS — R748 Abnormal levels of other serum enzymes: Secondary | ICD-10-CM | POA: Diagnosis not present

## 2022-07-11 DIAGNOSIS — G894 Chronic pain syndrome: Secondary | ICD-10-CM | POA: Diagnosis not present

## 2022-07-11 DIAGNOSIS — M810 Age-related osteoporosis without current pathological fracture: Secondary | ICD-10-CM | POA: Insufficient documentation

## 2022-07-11 DIAGNOSIS — E785 Hyperlipidemia, unspecified: Secondary | ICD-10-CM | POA: Insufficient documentation

## 2022-07-11 DIAGNOSIS — C3411 Malignant neoplasm of upper lobe, right bronchus or lung: Secondary | ICD-10-CM | POA: Diagnosis not present

## 2022-07-11 DIAGNOSIS — C7889 Secondary malignant neoplasm of other digestive organs: Secondary | ICD-10-CM | POA: Insufficient documentation

## 2022-07-11 DIAGNOSIS — Z8673 Personal history of transient ischemic attack (TIA), and cerebral infarction without residual deficits: Secondary | ICD-10-CM | POA: Insufficient documentation

## 2022-07-11 DIAGNOSIS — C778 Secondary and unspecified malignant neoplasm of lymph nodes of multiple regions: Secondary | ICD-10-CM | POA: Insufficient documentation

## 2022-07-11 DIAGNOSIS — Z79899 Other long term (current) drug therapy: Secondary | ICD-10-CM | POA: Insufficient documentation

## 2022-07-11 DIAGNOSIS — Z5112 Encounter for antineoplastic immunotherapy: Secondary | ICD-10-CM | POA: Insufficient documentation

## 2022-07-11 DIAGNOSIS — Z85828 Personal history of other malignant neoplasm of skin: Secondary | ICD-10-CM | POA: Diagnosis not present

## 2022-07-11 DIAGNOSIS — R5383 Other fatigue: Secondary | ICD-10-CM

## 2022-07-11 LAB — CBC WITH DIFFERENTIAL (CANCER CENTER ONLY)
Abs Immature Granulocytes: 0.02 10*3/uL (ref 0.00–0.07)
Basophils Absolute: 0 10*3/uL (ref 0.0–0.1)
Basophils Relative: 0 %
Eosinophils Absolute: 0.2 10*3/uL (ref 0.0–0.5)
Eosinophils Relative: 3 %
HCT: 32.4 % — ABNORMAL LOW (ref 36.0–46.0)
Hemoglobin: 11.4 g/dL — ABNORMAL LOW (ref 12.0–15.0)
Immature Granulocytes: 0 %
Lymphocytes Relative: 25 %
Lymphs Abs: 1.7 10*3/uL (ref 0.7–4.0)
MCH: 35.6 pg — ABNORMAL HIGH (ref 26.0–34.0)
MCHC: 35.2 g/dL (ref 30.0–36.0)
MCV: 101.3 fL — ABNORMAL HIGH (ref 80.0–100.0)
Monocytes Absolute: 0.4 10*3/uL (ref 0.1–1.0)
Monocytes Relative: 6 %
Neutro Abs: 4.4 10*3/uL (ref 1.7–7.7)
Neutrophils Relative %: 66 %
Platelet Count: 204 10*3/uL (ref 150–400)
RBC: 3.2 MIL/uL — ABNORMAL LOW (ref 3.87–5.11)
RDW: 13.4 % (ref 11.5–15.5)
WBC Count: 6.6 10*3/uL (ref 4.0–10.5)
nRBC: 0 % (ref 0.0–0.2)

## 2022-07-11 LAB — CMP (CANCER CENTER ONLY)
ALT: 15 U/L (ref 0–44)
AST: 32 U/L (ref 15–41)
Albumin: 3.3 g/dL — ABNORMAL LOW (ref 3.5–5.0)
Alkaline Phosphatase: 170 U/L — ABNORMAL HIGH (ref 38–126)
Anion gap: 7 (ref 5–15)
BUN: <5 mg/dL — ABNORMAL LOW (ref 8–23)
CO2: 28 mmol/L (ref 22–32)
Calcium: 8.7 mg/dL — ABNORMAL LOW (ref 8.9–10.3)
Chloride: 103 mmol/L (ref 98–111)
Creatinine: 0.67 mg/dL (ref 0.44–1.00)
GFR, Estimated: >60 mL/min (ref >60)
Glucose, Bld: 92 mg/dL (ref 70–99)
Potassium: 3.3 mmol/L — ABNORMAL LOW (ref 3.5–5.1)
Sodium: 138 mmol/L (ref 135–145)
Total Bilirubin: 0.4 mg/dL (ref 0.3–1.2)
Total Protein: 6 g/dL — ABNORMAL LOW (ref 6.5–8.1)

## 2022-07-11 LAB — TSH: TSH: 2.274 u[IU]/mL (ref 0.350–4.500)

## 2022-07-11 MED ORDER — SODIUM CHLORIDE 0.9 % IV SOLN
1200.0000 mg | Freq: Once | INTRAVENOUS | Status: AC
  Administered 2022-07-11: 1200 mg via INTRAVENOUS
  Filled 2022-07-11: qty 20

## 2022-07-11 MED ORDER — HEPARIN SOD (PORK) LOCK FLUSH 100 UNIT/ML IV SOLN
500.0000 [IU] | Freq: Once | INTRAVENOUS | Status: AC | PRN
  Administered 2022-07-11: 500 [IU]

## 2022-07-11 MED ORDER — SODIUM CHLORIDE 0.9% FLUSH
10.0000 mL | INTRAVENOUS | Status: DC | PRN
  Administered 2022-07-11: 10 mL

## 2022-07-11 MED ORDER — SODIUM CHLORIDE 0.9 % IV SOLN: Freq: Once | INTRAVENOUS | Status: AC

## 2022-07-11 NOTE — Progress Notes (Signed)
[facility] Telephone:([phone]) [phone]   Fax:(336) [address], [place] [address]  DIAGNOSIS: Extensive stage (T2a, N3, [name], large right anterior mediastinal and supraclavicular lymphadenopathy as well as pancreatic and splenic metastasis diagnosed in September 2017.  The patient had disease progression in October 2019.  PRIOR THERAPY:  1) Systemic chemotherapy was carboplatin for AUC of 5 on day 1 and etoposide 100 MG/M2 on days 1, 2 and 3 with Neulasta support. Status post 6 cycles with significant response of her disease. 2) Prophylactic cranial irradiation under the care of Dr. Sondra Come on 12/02/2016. 3) stereotactic radiotherapy to the recurrent right upper lobe pulmonary nodule under the care of Dr. Sondra Come completed November 10, 2017. 4) Retreatment with systemic chemotherapy with carboplatin for AUC of 5 on day 1 and etoposide 100 mg/M2 on days 1, 2 and 3 as well as Tecentriq (Atezolizumab) 1200 mg IV every 3 weeks with Neulasta support.  First dose June 22, 2018 for disease recurrence.  Status post 5 cycles.  Starting from cycle #2 her dose of carboplatin will be reduced to AUC of 4 and etoposide 80 mg/M2 on days 1, 2 and 3 in addition to the regular dose of Tecentriq.  CURRENT THERAPY: Maintenance treatment with single agent Tecentriq 1200 mg IV every 3 weeks.  Status post 61 cycles.  INTERVAL HISTORY: Debra BLASCHKE 61 y.o. female returns to the clinic today for follow-up visit.  The patient is feeling fine today with no concerning complaints.  She has been off treatment for the last 2 months secondary to elevated liver enzymes and treated with steroids.  She also has a cyst removed from her back.  She is feeling much better now.  She denied having any current chest pain, shortness of breath, cough or hemoptysis.  She denied having any fever  or chills.  She has no nausea, vomiting, diarrhea or constipation.  She has no headache or visual changes.  She denied having any significant weight loss or night sweats.  She is here today for evaluation before resuming her treatment with immunotherapy.  MEDICAL HISTORY: Past Medical History:  Diagnosis Date   Basal cell carcinoma of cheek    L side of face   Blood type, Rh positive    Cancer (HCC)    Chronic pain syndrome    Depression 08/07/2016   Encounter for antineoplastic chemotherapy 07/17/2016   History of external beam radiation therapy 11/19/16-12/02/16   brain 25 Gy in 10 fractions   Hyperlipidemia    Hypertension    Hypertension 08/07/2016   Osteoporosis    Small cell lung cancer ([facility]) dx'd 05/2016    ALLERGIES:  is allergic to codeine, motrin [ibuprofen], thiazide-type diuretics, and vicodin [hydrocodone-acetaminophen].  MEDICATIONS:  Current Outpatient Medications  Medication Sig Dispense Refill   atorvastatin (LIPITOR) 80 MG tablet Take 1 tablet (80 mg total) by mouth daily. 30 tablet 11   clopidogrel (PLAVIX) 75 MG tablet Take 1 tablet (75 mg total) by mouth daily. 90 tablet 3   diazepam (VALIUM) 5 MG tablet Take 5 mg by mouth 3 (three) times daily as needed for anxiety.   2   lidocaine-prilocaine (EMLA) cream Apply 1 application. topically as needed. 30 g 2   potassium chloride 20 MEQ/15ML (10%) SOLN Take 15 mLs (20 mEq total) by mouth daily. 100 mL 0   traMADol (  ULTRAM) 50 MG tablet Take 1 tablet (50 mg total) by mouth 4 (four) times daily as needed for moderate pain (confirmed with her Pharmacy-Dr K. Little 03/15 she received #120). 30 tablet 0   Vitamin D, Ergocalciferol, [name],000 Units by mouth 3 (three) times a week.     No current facility-administered medications for this visit.   Facility-Administered Medications Ordered in Other Visits  Medication Dose Route Frequency Provider Last Rate Last Admin   sodium  chloride flush (NS) 0.9 % injection 10 mL  10 mL Intracatheter PRN [name], MD   10 mL at 07/11/22 0748    SURGICAL HISTORY:  Past Surgical History:  Procedure Laterality Date   ABDOMINAL HYSTERECTOMY  1981   APPENDECTOMY     at age 71   EYE SURGERY     left   IR GENERIC HISTORICAL  06/03/2016   IR FLUORO GUIDE PORT INSERTION RIGHT 06/03/2016 [facility]   IR GENERIC HISTORICAL  06/03/2016   IR US GUIDE VASC ACCESS RIGHT 06/03/2016 [facility]   SKIN CANCER EXCISION     basal cell carcinoma L side of face    REVIEW OF SYSTEMS:  A comprehensive review of systems was negative except for: Constitutional: positive for fatigue   PHYSICAL EXAMINATION: General appearance: alert, cooperative, fatigued, and no distress Head: Normocephalic, without obvious abnormality, atraumatic Neck: no adenopathy, no JVD, supple, symmetrical, trachea midline, and thyroid not enlarged, symmetric, no tenderness/mass/nodules Lymph nodes: Cervical, supraclavicular, and axillary nodes normal. Resp: clear to auscultation bilaterally Back: symmetric, no curvature. ROM normal. No CVA tenderness. Cardio: regular rate and rhythm, S1, S2 normal, no murmur, click, rub or gallop GI: soft, non-tender; bowel sounds normal; no masses,  no organomegaly Extremities: extremities normal, atraumatic, no cyanosis or edema  ECOG PERFORMANCE STATUS: 1 - Symptomatic but completely ambulatory  There were no vitals taken for this visit.  LABORATORY DATA: Lab Results  Component Value Date   WBC 11.0 (H) 05/02/2022   HGB 11.9 (L) 05/02/2022   HCT 34.2 (L) 05/02/2022   MCV 105.6 (H) 05/02/2022   PLT 348 05/02/2022      Chemistry      Component Value Date/Time   NA 137 05/02/2022 0845   NA 141 08/01/2017 0835   K 3.4 (L) 05/02/2022 0845   K 3.5 08/01/2017 0835   CL 102 05/02/2022 0845   CO2 31 05/02/2022 0845   CO2 25 08/01/2017 0835   BUN 6 (L) 05/02/2022 0845   BUN 8.0 08/01/2017 0835   CREATININE 0.59  05/02/2022 0845   CREATININE 0.9 08/01/2017 0835      Component Value Date/Time   CALCIUM 8.7 (L) 05/02/2022 0845   CALCIUM 9.9 08/01/2017 0835   ALKPHOS 256 (H) 05/02/2022 0845   ALKPHOS 142 08/01/2017 0835   AST 51 (H) 05/02/2022 0845   AST 15 08/01/2017 0835   ALT 65 (H) 05/02/2022 0845   ALT 19 08/01/2017 0835   BILITOT 0.5 05/02/2022 0845   BILITOT 0.35 08/01/2017 0835       RADIOGRAPHIC STUDIES: No results found.  ASSESSMENT AND PLAN:  This is a very pleasant 61 years old white female with extensive stage small cell lung cancer status post systemic chemotherapy with carbo platinum and etoposide for 6 cycles and the patient rotated her treatment well except for chemotherapy-induced anemia and requirement for PRBCs and platelet transfusion. She had significant improvement in her disease with the chemotherapy. She also had prophylactic  cranial irradiation. She also underwent stereotactic radiotherapy to progressive right upper lobe pulmonary nodule. The patient has been in observation for close to 2 years. Repeat CT scan of the chest, abdomen and pelvis performed recently showed evidence for disease progression in the abdomen. The patient started on systemic chemotherapy again with carboplatin, etoposide and Tecentriq status post 66 cycles.  Starting from cycle #5 the patient is treated with maintenance single agent Tecentriq. The patient has been tolerating this treatment well but that has been on hold the last 2 months because of elevated liver dysfunction treated with prednisone.  She also had flulike symptoms several weeks ago. I will consider resuming her treatment if the liver enzymes had improved. For the history of stroke, she is followed by neurology and she is currently on Plavix and aspirin. She will come back for follow-up visit in 3 weeks for evaluation before the next cycle of her treatment. The patient was advised to call immediately if she has any other concerning  symptoms in the interval. All questions were answered. The patient knows to call the clinic with any problems, questions or concerns. We can certainly see the patient much sooner if necessary.  Disclaimer: This note was dictated with voice recognition software. Similar sounding words can inadvertently be transcribed and may not be corrected upon review.

## 2022-07-11 NOTE — Patient Instructions (Signed)
[facility]  Discharge Instructions: Thank you for choosing [facility] to provide your oncology and hematology care.   If you have a lab appointment with the [facility], please go directly to the [facility] and check in at the registration area.   Wear comfortable clothing and clothing appropriate for easy access to any Portacath or PICC line.   We strive to give you quality time with your provider. You may need to reschedule your appointment if you arrive late (15 or more minutes).  Arriving late affects you and other patients whose appointments are after yours.  Also, if you miss three or more appointments without notifying the office, you may be dismissed from the clinic at the provider's discretion.      For prescription refill requests, have your pharmacy contact our office and allow 72 hours for refills to be completed.    Today you received the following chemotherapy and/or immunotherapy agents: Tecentriq.       To help prevent nausea and vomiting after your treatment, we encourage you to take your nausea medication as directed.  BELOW ARE SYMPTOMS THAT SHOULD BE REPORTED IMMEDIATELY: *FEVER GREATER THAN 100.4 F (38 C) OR HIGHER *CHILLS OR SWEATING *NAUSEA AND VOMITING THAT IS NOT CONTROLLED WITH YOUR NAUSEA MEDICATION *UNUSUAL SHORTNESS OF BREATH *UNUSUAL BRUISING OR BLEEDING *URINARY PROBLEMS (pain or burning when urinating, or frequent urination) *BOWEL PROBLEMS (unusual diarrhea, constipation, pain near the anus) TENDERNESS IN MOUTH AND THROAT WITH OR WITHOUT PRESENCE OF ULCERS (sore throat, sores in mouth, or a toothache) UNUSUAL RASH, SWELLING OR PAIN  UNUSUAL VAGINAL DISCHARGE OR ITCHING   Items with * indicate a potential emergency and should be followed up as soon as possible or go to the Emergency Department if any problems should occur.  Please show the CHEMOTHERAPY ALERT CARD or IMMUNOTHERAPY ALERT CARD at check-in  to the Emergency Department and triage nurse.  Should you have questions after your visit or need to cancel or reschedule your appointment, please contact [facility]  Dept: [phone]  and follow the prompts.  Office hours are 8:00 a.m. to 4:30 p.m. Monday - Friday. Please note that voicemails left after 4:00 p.m. may not be returned until the following business day.  We are closed weekends and major holidays. You have access to a nurse at all times for urgent questions. Please call the main number to the clinic Dept: [phone] and follow the prompts.   For any non-urgent questions, you may also contact your provider using MyChart. We now offer e-Visits for anyone 20 and older to request care online for non-urgent symptoms. For details visit [email or web address].   Also download the MyChart app! Go to the app store, search "MyChart", open the app, select , and log in with your MyChart username and password.  Masks are optional in the cancer centers. If you would like for your care team to wear a mask while they are taking care of you, please let them know. You may have one support person who is at least 63 years old accompany you for your appointments.

## 2022-07-11 NOTE — Progress Notes (Signed)
Nutrition Follow-up:  Patient with extensive stage small cell lung cancer. She is currently receiving maintenance treatment with single agent Tecentriq 1200 mg IV every 3 weeks.    Met with patient during infusion. She has been off treatment the last 2 months secondary to elevated enzymes and unconfirmed covid infection. Patient reports she is feeling better. Her appetite has been improving over the last week. Patient endorses one month of bowel incontinence and not eating much during this time. This has resolved.  She is eating bowl of chicken soup during visit today. Yesterday she recalls glass of buttermilk and hot dog (mustard, chili, onion) with chips for supper. She has reduced intake of [employer] recalls 5-6/day along with some Gatorade and regular whole milk. Patient is drinking CIB or Ensure when she has them. Patient requesting case of Ensure as well as additional bag of food to take home.    Medications: reviewed  Labs: K 3.3, BUN <5, albumin 3.3  Anthropometrics: Weight 96 lb 6.4 oz today decreased  10/22 - 99 lb 8/31 - 99 lb 3.2 oz   NUTRITION DIAGNOSIS: Unintended weight loss continues     INTERVENTION:  Encouraged high calorie high protein snacks frequently  Recommend drinking 2 Ensure Plus/equivalent daily - pt agreeable to one Provided one case Ensure Plus HP + one food bag from [facility]     MONITORING, EVALUATION, GOAL: weight trends, intake   NEXT VISIT: Thursday December 21 during infusion with Pamala Hurry

## 2022-07-12 ENCOUNTER — Other Ambulatory Visit: Payer: Self-pay

## 2022-07-23 ENCOUNTER — Other Ambulatory Visit: Payer: Medicare Other

## 2022-07-23 ENCOUNTER — Ambulatory Visit: Payer: Medicare Other

## 2022-07-23 ENCOUNTER — Ambulatory Visit: Payer: Medicare Other | Admitting: Physician Assistant

## 2022-07-29 NOTE — Progress Notes (Deleted)
[place], [place] [address]  DIAGNOSIS:  Extensive stage (T2a, N3, [name], large right anterior mediastinal and supraclavicular lymphadenopathy as well as pancreatic and splenic metastasis diagnosed in September 2017.  The patient had disease progression in October 2019.   PRIOR THERAPY: 1) Systemic chemotherapy was carboplatin for AUC of 5 on day 1 and etoposide 100 MG/M2 on days 1, 2 and 3 with Neulasta support. Status post 6 cycles with significant response of her disease. 2) Prophylactic cranial irradiation under the care of Dr. Sondra Come on 12/02/2016. 3) stereotactic radiotherapy to the recurrent right upper lobe pulmonary nodule under the care of Dr. Sondra Come completed November 10, 2017. 4) Retreatment with systemic chemotherapy with carboplatin for AUC of 5 on day 1 and etoposide 100 mg/M2 on days 1, 2 and 3 as well as Tecentriq (Atezolizumab) 1200 mg IV every 3 weeks with Neulasta support.  First dose June 22, 2018 for disease recurrence.  Status post 5 cycles.  Starting from cycle #2 her dose of carboplatin will be reduced to AUC of 4 and etoposide 80 mg/M2 on days 1, 2 and 3 in addition to the regular dose of Tecentriq.  CURRENT THERAPY: Maintenance treatment with single agent Tecentriq 1200 mg IV every 3 weeks.  Status post 67 cycles.   INTERVAL HISTORY: Debra Kidd 61 y.o. female returns to the clinic today for a follow-up visit accompanied by her husband. The patient is feeling fair today without any concerning complaints except for persistent fatigue and generalized weakness.  She is followed closely by social work and nutrition.  The patient is not very active and does not eat or drink a well-balanced diet.  She often receives complementary food items and supplemental protein drinks in our clinic to take home.   The patient had been off  treatment for 2 months due to ongoing flu like symptoms. She also had elevated LFTs in August 2023. She completed steroids.   Otherwise she denies any new concerns today.  The patient is currently undergoing treatment with Tecentriq. She tolerates this well except for dry skin and itching. She uses hydrocortisone cream. She denies any fever, chills, or night sweats.   She denies any nausea, vomiting, or constipation.  She sometimes has intermittent diarrhea. She has frequent hypokalemia on labs likely because of this.   She reports her baseline dyspnea on exertion which is stable without any chest pain or hemoptysis.  She reports a baseline mild cough and she continues to smoke approximately 2 packs of cigarettes per day.    She is here today for evaluation and repeat blood work before undergoing cycle #68.   MEDICAL HISTORY: Past Medical History:  Diagnosis Date   Basal cell carcinoma of cheek    L side of face   Blood type, Rh positive    Cancer (HCC)    Chronic pain syndrome    Depression 08/07/2016   Encounter for antineoplastic chemotherapy 07/17/2016   History of external beam radiation therapy 11/19/16-12/02/16   brain 25 Gy in 10 fractions   Hyperlipidemia    Hypertension    Hypertension 08/07/2016   Osteoporosis    Small cell lung cancer ([facility]) dx'd 05/2016    ALLERGIES:  is allergic to codeine, motrin [ibuprofen], thiazide-type diuretics, and vicodin [hydrocodone-acetaminophen].  MEDICATIONS:  Current Outpatient Medications  Medication Sig Dispense Refill   atorvastatin (LIPITOR) 80  MG tablet Take 1 tablet (80 mg total) by mouth daily. 30 tablet 11   clopidogrel (PLAVIX) 75 MG tablet Take 1 tablet (75 mg total) by mouth daily. 90 tablet 3   diazepam (VALIUM) 5 MG tablet Take 5 mg by mouth 3 (three) times daily as needed for anxiety.   2   lidocaine-prilocaine (EMLA) cream Apply 1 application. topically as needed. 30 g 2   potassium chloride 20 MEQ/15ML (10%) SOLN Take 15 mLs (20  mEq total) by mouth daily. 100 mL 0   traMADol (ULTRAM) 50 MG tablet Take 1 tablet (50 mg total) by mouth 4 (four) times daily as needed for moderate pain (confirmed with her Pharmacy-Dr K. Little 03/15 she received #120). 30 tablet 0   Vitamin D, Ergocalciferol, [name],000 Units by mouth 3 (three) times a week.     No current facility-administered medications for this visit.    SURGICAL HISTORY:  Past Surgical History:  Procedure Laterality Date   ABDOMINAL HYSTERECTOMY  1981   APPENDECTOMY     at age 22   EYE SURGERY     left   IR GENERIC HISTORICAL  06/03/2016   IR FLUORO GUIDE PORT INSERTION RIGHT 06/03/2016 [facility]   IR GENERIC HISTORICAL  06/03/2016   IR US GUIDE VASC ACCESS RIGHT 06/03/2016 [facility]   SKIN CANCER EXCISION     basal cell carcinoma L side of face    REVIEW OF SYSTEMS:   Review of Systems  Constitutional: Negative for appetite change, chills, fatigue, [name], nosebleeds, sore throat and trouble swallowing.   Eyes: Negative for eye problems and icterus.  Respiratory: Negative for cough, hemoptysis, [name], constipation, diarrhea, nausea and vomiting.  Genitourinary: Negative for bladder incontinence, difficulty urinating, dysuria, frequency and hematuria.   Musculoskeletal: Negative for back pain, gait problem, neck pain and neck stiffness.  Skin: Negative for itching and rash.  Neurological: Negative for dizziness, extremity weakness, gait problem, headaches, [name], depression and sleep disturbance. The patient is not nervous/anxious.     PHYSICAL EXAMINATION:  There  were no vitals taken for this visit.  ECOG PERFORMANCE STATUS: {CHL ONC ECOG [id number]  Physical Exam  Constitutional: Oriented to person, place, and time and well-developed, well-nourished, and in no distress. No distress.  HENT:  Head: Normocephalic and atraumatic.  Mouth/Throat: Oropharynx is clear and moist. No oropharyngeal exudate.  Eyes: Conjunctivae are normal. Right eye exhibits no discharge. Left eye exhibits no discharge. No scleral icterus.  Neck: Normal range of motion. Neck supple.  Cardiovascular: Normal rate, regular rhythm, normal heart sounds and intact distal pulses.   Pulmonary/Chest: Effort normal and breath sounds normal. No respiratory distress. No wheezes. No rales.  Abdominal: Soft. Bowel sounds are normal. Exhibits no distension and no mass. There is no tenderness.  Musculoskeletal: Normal range of motion. Exhibits no edema.  Lymphadenopathy:    No cervical adenopathy.  Neurological: Alert and oriented to person, place, and time. Exhibits normal muscle tone. Gait normal. Coordination normal.  Skin: Skin is warm and dry. No rash noted. Not diaphoretic. No erythema. No pallor.  Psychiatric: Mood, memory and judgment normal.  Vitals reviewed.  LABORATORY DATA: Lab Results  Component  Value Date   WBC 6.6 07/11/2022   HGB 11.4 (L) 07/11/2022   HCT 32.4 (L) 07/11/2022   MCV 101.3 (H) 07/11/2022   PLT 204 07/11/2022      Chemistry      Component Value Date/Time   NA 138 07/11/2022 0748   NA 141 08/01/2017 0835   K 3.3 (L) 07/11/2022 0748   K 3.5 08/01/2017 0835   CL 103 07/11/2022 0748   CO2 28 07/11/2022 0748   CO2 25 08/01/2017 0835   BUN <5 (L) 07/11/2022 0748   BUN 8.0 08/01/2017 0835   CREATININE 0.67 07/11/2022 0748   CREATININE 0.9 08/01/2017 0835      Component Value Date/Time   CALCIUM 8.7 (L) 07/11/2022 0748   CALCIUM 9.9 08/01/2017 0835   ALKPHOS 170 (H) 07/11/2022 0748   ALKPHOS 142 08/01/2017 0835   AST 32 07/11/2022 0748   AST  15 08/01/2017 0835   ALT 15 07/11/2022 0748   ALT 19 08/01/2017 0835   BILITOT 0.4 07/11/2022 0748   BILITOT 0.35 08/01/2017 0835       RADIOGRAPHIC STUDIES:  No results found.   ASSESSMENT/PLAN:  This is a very pleasant 61 year old Caucasian female with extensive stage small cell lung cancer.  She presented with a right upper lobe lung mass, large right anterior mediastinal and supraclavicular lymphadenopathy as well as a pancreatic and splenic metastasis.  She was diagnosed in September 2017.    The patient underwent systemic chemotherapy with carboplatin and etoposide.  She is status post 6 cycles.  She tolerated treatment well except for chemotherapy-induced anemia which required  pRBCs and platelet transfusions.  She had a significant improvement of her disease with chemotherapy. The patient then underwent prophylactic cranial irradiation. She then underwent stereotactic radiotherapy to the right upper lobe and pulmonary nodule.  She had been on observation for 2 years before showing evidence of disease progression.    She then was started on systemic chemotherapy with carboplatin, etoposide, and Tecentriq. Starting from cycle #5, she has been on maintenance single agent Tecentriq.  She has been tolerating treatment well. She is status post 67  cycles of Tecentriq total.  Labs were reviewed. Recommend that she *** with cycle #68 today as scheduled.   We will see her back for a follow up visit in 3 weeks for evaluation and repeat blood work before starting cycle #69.   Reiterated again strategies to improve nutritional intake which is likely contributing to her fatigue.  The patient generally does not take good care of of her health.  She has been seen by member the nutritionist team in the past.  Discussed that she finds the boost and Ensure to be too thick but she can water this down with ice cubes, skin milk, etc.   Trauma encouraged the patient to quit smoking.  The patient would  likely have improvement in her appetite and her breathing if she quit.  Patient is not interested in quitting at this time.   Pain clinic?  The patient was advised to call immediately if she has any concerning symptoms in the interval. The patient voices understanding of current disease status and treatment options and is in agreement with the current care plan. All questions were answered. The patient knows to call the clinic with any problems, questions or concerns. We can certainly see the patient much sooner if necessary       No orders of the defined types were placed in this encounter.  I spent {CHL ONC TIME VISIT - BOBOF:[id number]} counseling the patient face to face. The total time spent in the appointment was {CHL ONC TIME VISIT - HRVAC:[id number]}.  Ray Glacken L Gustave Lindeman, PA-C 07/29/22

## 2022-08-01 ENCOUNTER — Inpatient Hospital Stay: Payer: Medicare Other | Admitting: Physician Assistant

## 2022-08-01 ENCOUNTER — Inpatient Hospital Stay: Payer: Medicare Other

## 2022-08-01 ENCOUNTER — Telehealth: Payer: Self-pay

## 2022-08-01 ENCOUNTER — Telehealth: Payer: Self-pay | Admitting: Physician Assistant

## 2022-08-01 NOTE — Telephone Encounter (Signed)
Called to r/s per 11/30 in basket, pt did not want to r/s until she spoke with her husband, said she will call back

## 2022-08-01 NOTE — Telephone Encounter (Signed)
This nurse attempted to reach this patient related to no show of scheduled appointments today.  Mobile phone does not have voicemail setup.  The home phone also did not have a voicemail.  Will have scheduling attempt to reach her to reschedule.  No further concerns at this time.

## 2022-08-07 NOTE — Progress Notes (Unsigned)
[facility] [address] [place]. [place] [address] [phone]       OFFICE FOLLOW UP NOTE  [name] Date of Birth:  [birth year] Medical Record Number:  [id number]   Referring MD: Prince Rome PA-C  Reason for Referral: Stroke   No chief complaint on file.     HPI:   Update 08/08/2022 JM: Patient returns for 63-month stroke follow-up.  Overall stable without new stroke/TIA symptoms.  Reports residual imbalance and left-sided weakness, stable since prior visit.  Use of RW at all times, no recent falls.  Remains on Plavix and atorvastatin.  Blood pressure well controlled.  Continued tobacco use and occasional THC use.     History provided for reference purposes only Update 02/04/2022 JM: patient returns for follow up visit after initial consult visit with Dr. Leonie Man approx 3 months ago.  She is accompanied by her husband.  Stable from stroke standpoint without new stroke/TIA symptoms. Balance continues to be poor, continue left-sided weakness. Stable since prior visit without worsening. Use of RW at all times, no recent falls.   Compliant on plavix, denies side effects. Stated on atorvastatin 80 mg daily for LDL 155 and multifocal extra and intra cranial stenosis as per CTA head/neck.  Denies side effects.  She has not had repeat lipid panel. Her prior PCP Dr. Rex Kras has since retired and has had difficulty finding a new PCP. She is requesting refills on statin and plavix today. Blood pressure today 101/72. Does not routinely monitor at home but does have issues with it being low.  Continued tobacco use 1.5 - 2 PPD since the age of 54 (72-96 pack-year history), THC use 2-3x per week.  Denies any other recreational drug use or EtOH use.  She continues to undergo immunotherapy every 3 weeks for recurrent lung cancer.  No further concerns at this time.  Consult visit 10/25/2021 Dr. Leonie Man: [name] is a 61 year old Caucasian lady seen today for initial  office consultation visit.  She is accompanied by her husband.  History is obtained from them and review of electronic medical records and I have personally reviewed available pertinent imaging films in PACS.  She has past medical history of extensive stage(T2a, N3, [name], large right anterior mediastinal and supraclavicular lymphadenopathy as well as pancreatic and splenic metastasis diagnosed in September 2017.  The patient had disease progression in October 2019.  She has been treated with multiple rounds of chemotherapy, prophylactic cranial irradiation in 2018, stereotactic radiotherapy to the recurrent right upper lobe nodule in 2019.  She also has past medical history of hypertension, hyperlipidemia, osteoporosis, chronic pain syndrome.  She was seen in the ER on 08/24/2021 with increasing left-sided weakness and underwent an MRI scan which I personally reviewed shows old right occipital posterior cerebral artery distribution infarct and a few areas of scattered cerebral microhemorrhages.  There was a weak diffusion positive area in the left parieto-occipital white matter seen only on axial but not on coronal DWI images.  Primary physician has referred the patient for my evaluation to decide on stroke work-up.  Patient did have a previous stroke in May 2020 when she presented with left-sided hemiparesis in the setting of a positive COVID test though she was not having active symptoms of COVID.  At that time the MRI had shown patchy right PCA infarct.  MR angiogram of the brain and neck showed occlusion of the right posterior cerebral artery  at its origin, severely diminished flow within the right vertebral artery which is likely severely stenotic and severe stenosis of proximal left common carotid artery.  Patient did not follow-up in neurology clinic after that.  Patient states she has residual left hand weakness from that stroke which has not  fully recovered.  Her balance is poor and and she has been using a walker.  She has had no recent falls or injuries.  She denies any significant headaches, vertigo, numbness or weakness.  She is currently on immunotherapy for her recurrent lung cancer.  She remains on Plavix which is tolerating well without bruising or bleeding.  Blood pressures well controlled today it is 119/86.  She has no complaints today    ROS:   14 system review of systems is positive for those listed in HPI and all other systems negative  PMH:  Past Medical History:  Diagnosis Date   Basal cell carcinoma of cheek    L side of face   Blood type, Rh positive    Cancer (HCC)    Chronic pain syndrome    Depression 08/07/2016   Encounter for antineoplastic chemotherapy 07/17/2016   History of external beam radiation therapy 11/19/16-12/02/16   brain 25 Gy in 10 fractions   Hyperlipidemia    Hypertension    Hypertension 08/07/2016   Osteoporosis    Small cell lung cancer ([facility]) dx'd 05/2016    Social History:  Social History   Socioeconomic History   Marital status: Married    Spouse name: Not on file   Number of children: 2   Years of education: Not on file   Highest education level: Not on file  Occupational History   Not on file  Tobacco Use   Smoking status: Every Day    Packs/day: 2.00    Years: 43.00    Total pack years: 86.00    Types: Cigarettes   Smokeless tobacco: Never  Vaping Use   Vaping Use: Never used  Substance and Sexual Activity   Alcohol use: Not Currently    Comment: quit drinking beer 02/2016   Drug use: Yes    Frequency: 7.0 times per week    Types: Marijuana   Sexual activity: Not on file  Other Topics Concern   Not on file  Social History Narrative   Married, lives with spouse   2 children   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   Social Determinants of Health   Financial Resource Strain: High Risk (03/21/2021)   Overall  Financial Resource Strain (CARDIA)    Difficulty of Paying Living Expenses: Very hard  Food Insecurity: Food Insecurity Present (03/21/2021)   Hunger Vital Sign    Worried About Running Out of Food in the Last Year: Sometimes true    Ran Out of Food in the Last Year: Sometimes true  Transportation Needs: No Transportation Needs (03/21/2021)   PRAPARE - Hydrologist (Medical): No    Lack of Transportation (Non-Medical): No  Physical Activity: Not on file  Stress: Stress Concern Present (03/21/2021)   [place]    Feeling of Stress : To some extent  Social Connections: Moderately Isolated (03/21/2021)   Social Connection and Isolation Panel [NHANES]    Frequency of Communication with Friends and Family: More than three times a week    Frequency of Social Gatherings with Friends and Family: More than  three times a week    Attends Religious Services: Never    Active Member of Clubs or Organizations: No    Attends Archivist Meetings: Never    Marital Status: Married  Human resources officer Violence: Not on file    Medications:   Current Outpatient Medications on File Prior to Visit  Medication Sig Dispense Refill   atorvastatin (LIPITOR) 80 MG tablet Take 1 tablet (80 mg total) by mouth daily. 30 tablet 11   clopidogrel (PLAVIX) 75 MG tablet Take 1 tablet (75 mg total) by mouth daily. 90 tablet 3   diazepam (VALIUM) 5 MG tablet Take 5 mg by mouth 3 (three) times daily as needed for anxiety.   2   lidocaine-prilocaine (EMLA) cream Apply 1 application. topically as needed. 30 g 2   potassium chloride 20 MEQ/15ML (10%) SOLN Take 15 mLs (20 mEq total) by mouth daily. 100 mL 0   traMADol (ULTRAM) 50 MG tablet Take 1 tablet (50 mg total) by mouth 4 (four) times daily as needed for moderate pain (confirmed with her Pharmacy-Dr K. Little 03/15 she received #120). 30 tablet 0   Vitamin D,  Ergocalciferol, [name],000 Units by mouth 3 (three) times a week.     No current facility-administered medications on file prior to visit.    Allergies:   Allergies  Allergen Reactions   Codeine Nausea And Vomiting   Motrin [Ibuprofen]     Elevation of BP   [name]     intolerant   Vicodin [Hydrocodone-Acetaminophen] Nausea And Vomiting    Physical Exam There were no vitals filed for this visit.  There is no height or weight on file to calculate BMI.  General: Frail malnourished looking pleasant middle-aged Caucasian lady, seated, in no evident distress Head: head normocephalic and atraumatic.   Neck: supple with no carotid or supraclavicular bruits Cardiovascular: regular rate and rhythm, no murmurs Musculoskeletal: no deformity Skin:  no rash/petichiae IV port in the right neck and upper chest. Vascular:  Normal pulses all extremities  Neurologic Exam Mental Status: Awake and fully alert. Oriented to place and time. Recent and remote memory intact. Attention span, concentration and fund of knowledge appropriate. Mood and affect appropriate.  Cranial Nerves: Pupils equal, briskly reactive to light. Extraocular movements full without nystagmus. Visual fields full to confrontation. Hearing intact. Facial sensation intact.  Left facial movements limited on the left due to previous basal cell carcinoma surgery., tongue, palate moves normally and symmetrically.  Motor: Mild left hemiparesis 4/5 in the upper extremity with mild grip weakness and diminished fine motor skills.  Left lower extremity 4+/5 with slightly increased tone.  Normal strength in the right. Sensory.: intact to touch , pinprick , position and vibratory sensation.  Coordination: Rapid alternating movements normal in all extremities. Finger-to-nose and heel-to-shin performed accurately bilaterally. Gait and Station: Deferred as RW not present  Reflexes: 1+  and symmetric. Toes downgoing.        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 Vascular risk factors include prior strokes, multifocal extra and intracranial stenosis, tobacco use, THC use and hyperlipidemia.    -Continue Plavix (clopidogrel ) 75 mg daily and atorvastatin 80mg  daily for secondary stroke prevention -refill provided but discussed ongoing refills will need to be obtained by PCP once established -Needs to find  a new PCP provider as her prior PCP has since retired, routine follow up with PCP for aggressive stroke risk factor management including BP goal<130/90 with avoidance of hypotension in setting of intra and extracranial stenosis and HLD with LDL goal<70 -stroke labs: LDL 65 (01/2022). A1c 5.1 (10/2021) -Discussed importance of complete tobacco and THC cessation as continued use greatly increases risk of additional strokes and worsening of multifocal stenosis.  She verbalized understanding but unfortunately, has no desire to quit.     Follow-up in 6 months or call earlier if needed    I spent 31 minutes of face-to-face and non-face-to-face time with patient and husband.  This included previsit chart review, lab review, study review, order entry, electronic health record documentation, patient and husband education and discussion regarding history of prior strokes with residual deficits, secondary stroke prevention measures and aggressive stroke risk factor management and answered all other questions to patient and husband satisfaction  Frann Rider, AGNP-BC  [facility] [address] [place], [place] [address]  Phone [phone] Fax [phone] Note: This document was prepared with digital dictation and possible smart phrase technology. Any transcriptional errors  that result from this process are unintentional.

## 2022-08-08 ENCOUNTER — Ambulatory Visit (INDEPENDENT_AMBULATORY_CARE_PROVIDER_SITE_OTHER): Payer: Medicare Other | Admitting: Adult Health

## 2022-08-08 ENCOUNTER — Encounter: Payer: Self-pay | Admitting: Adult Health

## 2022-08-08 VITALS — BP 109/78 | HR 87 | Ht 63.0 in | Wt 95.2 lb

## 2022-08-08 DIAGNOSIS — Z8673 Personal history of transient ischemic attack (TIA), and cerebral infarction without residual deficits: Secondary | ICD-10-CM | POA: Diagnosis not present

## 2022-08-08 DIAGNOSIS — Z720 Tobacco use: Secondary | ICD-10-CM

## 2022-08-08 DIAGNOSIS — I639 Cerebral infarction, unspecified: Secondary | ICD-10-CM | POA: Diagnosis not present

## 2022-08-08 NOTE — Patient Instructions (Addendum)
Continued use of rolling walker at all times for fall prevention  Continue clopidogrel 75 mg daily  and atorvastatin for secondary stroke prevention  Continue to follow up with PCP regarding blood pressure and cholesterol management  Maintain strict control of hypertension with blood pressure goal below 130/90 and cholesterol with LDL cholesterol (bad cholesterol) goal below 70 mg/dL.   Signs of a Stroke? Follow the BEFAST method:  Balance Watch for a sudden loss of balance, trouble with coordination or vertigo Eyes Is there a sudden loss of vision in one or both eyes? Or double vision?  Face: Ask the person to smile. Does one side of the face droop or is it numb?  Arms: Ask the person to raise both arms. Does one arm drift downward? Is there weakness or numbness of a leg? Speech: Ask the person to repeat a simple phrase. Does the speech sound slurred/strange? Is the person confused ? Time: If you observe any of these signs, call 911.  Highly encouraged complete tobacco cessation as continued use greatly increases risk of additional strokes and cardiovascular disease     As you have been stable from a stroke standpoint and routinely follow with PCP, you can follow up with [place] as needed. Please call with any stroke related questions or concerns in the future if needed.    Happy Holidays!      Thank you for coming to see [place] at [facility]. I hope we have been able to provide you high quality care today.  You may receive a patient satisfaction survey over the next few weeks. We would appreciate your feedback and comments so that we may continue to improve ourselves and the health of our patients.

## 2022-08-14 ENCOUNTER — Ambulatory Visit: Payer: Medicare Other

## 2022-08-14 ENCOUNTER — Ambulatory Visit: Payer: Medicare Other | Admitting: Internal Medicine

## 2022-08-14 ENCOUNTER — Other Ambulatory Visit: Payer: Medicare Other

## 2022-08-21 ENCOUNTER — Telehealth: Payer: Self-pay | Admitting: Medical Oncology

## 2022-08-21 NOTE — Telephone Encounter (Signed)
Pt cancelled appt for tomorrow due to urinary incontinence.

## 2022-08-22 ENCOUNTER — Inpatient Hospital Stay: Payer: Medicare Other

## 2022-08-22 ENCOUNTER — Inpatient Hospital Stay: Payer: Medicare Other | Admitting: Nutrition

## 2022-08-22 ENCOUNTER — Inpatient Hospital Stay: Payer: Medicare Other | Admitting: Internal Medicine

## 2022-08-23 ENCOUNTER — Other Ambulatory Visit: Payer: Self-pay

## 2022-08-26 ENCOUNTER — Other Ambulatory Visit: Payer: Self-pay

## 2022-08-28 ENCOUNTER — Other Ambulatory Visit: Payer: Self-pay | Admitting: Internal Medicine

## 2022-08-28 ENCOUNTER — Inpatient Hospital Stay: Payer: Medicare Other | Attending: Internal Medicine

## 2022-08-28 ENCOUNTER — Inpatient Hospital Stay (HOSPITAL_BASED_OUTPATIENT_CLINIC_OR_DEPARTMENT_OTHER): Payer: Medicare Other | Admitting: Internal Medicine

## 2022-08-28 ENCOUNTER — Inpatient Hospital Stay: Payer: Medicare Other

## 2022-08-28 ENCOUNTER — Ambulatory Visit: Payer: Medicare Other | Admitting: Dietician

## 2022-08-28 ENCOUNTER — Other Ambulatory Visit: Payer: Self-pay

## 2022-08-28 VITALS — BP 117/82 | HR 93 | Temp 98.2°F | Resp 15 | Wt 95.4 lb

## 2022-08-28 DIAGNOSIS — C7889 Secondary malignant neoplasm of other digestive organs: Secondary | ICD-10-CM | POA: Diagnosis not present

## 2022-08-28 DIAGNOSIS — Z79899 Other long term (current) drug therapy: Secondary | ICD-10-CM | POA: Insufficient documentation

## 2022-08-28 DIAGNOSIS — I10 Essential (primary) hypertension: Secondary | ICD-10-CM | POA: Diagnosis not present

## 2022-08-28 DIAGNOSIS — G894 Chronic pain syndrome: Secondary | ICD-10-CM | POA: Diagnosis not present

## 2022-08-28 DIAGNOSIS — C3491 Malignant neoplasm of unspecified part of right bronchus or lung: Secondary | ICD-10-CM | POA: Diagnosis not present

## 2022-08-28 DIAGNOSIS — C3411 Malignant neoplasm of upper lobe, right bronchus or lung: Secondary | ICD-10-CM | POA: Diagnosis not present

## 2022-08-28 DIAGNOSIS — Z85828 Personal history of other malignant neoplasm of skin: Secondary | ICD-10-CM | POA: Insufficient documentation

## 2022-08-28 DIAGNOSIS — C3490 Malignant neoplasm of unspecified part of unspecified bronchus or lung: Secondary | ICD-10-CM | POA: Diagnosis not present

## 2022-08-28 DIAGNOSIS — E785 Hyperlipidemia, unspecified: Secondary | ICD-10-CM | POA: Insufficient documentation

## 2022-08-28 DIAGNOSIS — Z5112 Encounter for antineoplastic immunotherapy: Secondary | ICD-10-CM | POA: Insufficient documentation

## 2022-08-28 DIAGNOSIS — Z8673 Personal history of transient ischemic attack (TIA), and cerebral infarction without residual deficits: Secondary | ICD-10-CM | POA: Insufficient documentation

## 2022-08-28 DIAGNOSIS — R5383 Other fatigue: Secondary | ICD-10-CM

## 2022-08-28 LAB — CMP (CANCER CENTER ONLY)
ALT: 11 U/L (ref 0–44)
AST: 22 U/L (ref 15–41)
Albumin: 3.2 g/dL — ABNORMAL LOW (ref 3.5–5.0)
Alkaline Phosphatase: 131 U/L — ABNORMAL HIGH (ref 38–126)
Anion gap: 5 (ref 5–15)
BUN: 7 mg/dL — ABNORMAL LOW (ref 8–23)
CO2: 29 mmol/L (ref 22–32)
Calcium: 8.6 mg/dL — ABNORMAL LOW (ref 8.9–10.3)
Chloride: 103 mmol/L (ref 98–111)
Creatinine: 0.71 mg/dL (ref 0.44–1.00)
GFR, Estimated: >60 mL/min (ref >60)
Glucose, Bld: 113 mg/dL — ABNORMAL HIGH (ref 70–99)
Potassium: 3.2 mmol/L — ABNORMAL LOW (ref 3.5–5.1)
Sodium: 137 mmol/L (ref 135–145)
Total Bilirubin: 0.4 mg/dL (ref 0.3–1.2)
Total Protein: 5.9 g/dL — ABNORMAL LOW (ref 6.5–8.1)

## 2022-08-28 LAB — CBC WITH DIFFERENTIAL (CANCER CENTER ONLY)
Abs Immature Granulocytes: 0.02 10*3/uL (ref 0.00–0.07)
Basophils Absolute: 0 10*3/uL (ref 0.0–0.1)
Basophils Relative: 0 %
Eosinophils Absolute: 0.2 10*3/uL (ref 0.0–0.5)
Eosinophils Relative: 2 %
HCT: 31.8 % — ABNORMAL LOW (ref 36.0–46.0)
Hemoglobin: 11 g/dL — ABNORMAL LOW (ref 12.0–15.0)
Immature Granulocytes: 0 %
Lymphocytes Relative: 27 %
Lymphs Abs: 1.7 10*3/uL (ref 0.7–4.0)
MCH: 36.5 pg — ABNORMAL HIGH (ref 26.0–34.0)
MCHC: 34.6 g/dL (ref 30.0–36.0)
MCV: 105.6 fL — ABNORMAL HIGH (ref 80.0–100.0)
Monocytes Absolute: 0.4 10*3/uL (ref 0.1–1.0)
Monocytes Relative: 7 %
Neutro Abs: 4 10*3/uL (ref 1.7–7.7)
Neutrophils Relative %: 64 %
Platelet Count: 247 10*3/uL (ref 150–400)
RBC: 3.01 MIL/uL — ABNORMAL LOW (ref 3.87–5.11)
RDW: 16.1 % — ABNORMAL HIGH (ref 11.5–15.5)
WBC Count: 6.3 10*3/uL (ref 4.0–10.5)
nRBC: 0 % (ref 0.0–0.2)

## 2022-08-28 LAB — TSH: TSH: 2.53 u[IU]/mL (ref 0.350–4.500)

## 2022-08-28 MED ORDER — SODIUM CHLORIDE 0.9% FLUSH
10.0000 mL | INTRAVENOUS | Status: DC | PRN
  Administered 2022-08-28: 10 mL

## 2022-08-28 MED ORDER — HEPARIN SOD (PORK) LOCK FLUSH 100 UNIT/ML IV SOLN
500.0000 [IU] | Freq: Once | INTRAVENOUS | Status: AC | PRN
  Administered 2022-08-28: 500 [IU]

## 2022-08-28 MED ORDER — SODIUM CHLORIDE 0.9 % IV SOLN: Freq: Once | INTRAVENOUS | Status: AC

## 2022-08-28 MED ORDER — SODIUM CHLORIDE 0.9 % IV SOLN
1200.0000 mg | Freq: Once | INTRAVENOUS | Status: AC
  Administered 2022-08-28: 1200 mg via INTRAVENOUS
  Filled 2022-08-28: qty 20

## 2022-08-28 NOTE — Patient Instructions (Signed)
[facility] ONCOLOGY  Discharge Instructions: Thank you for choosing [facility] to provide your oncology and hematology care.   If you have a lab appointment with the [facility], please go directly to the [facility] and check in at the registration area.   Wear comfortable clothing and clothing appropriate for easy access to any Portacath or PICC line.   We strive to give you quality time with your provider. You may need to reschedule your appointment if you arrive late (15 or more minutes).  Arriving late affects you and other patients whose appointments are after yours.  Also, if you miss three or more appointments without notifying the office, you may be dismissed from the clinic at the provider's discretion.      For prescription refill requests, have your pharmacy contact our office and allow 72 hours for refills to be completed.    Today you received the following chemotherapy and/or immunotherapy agents: Tecentriq.      To help prevent nausea and vomiting after your treatment, we encourage you to take your nausea medication as directed.  BELOW ARE SYMPTOMS THAT SHOULD BE REPORTED IMMEDIATELY: *FEVER GREATER THAN 100.4 F (38 C) OR HIGHER *CHILLS OR SWEATING *NAUSEA AND VOMITING THAT IS NOT CONTROLLED WITH YOUR NAUSEA MEDICATION *UNUSUAL SHORTNESS OF BREATH *UNUSUAL BRUISING OR BLEEDING *URINARY PROBLEMS (pain or burning when urinating, or frequent urination) *BOWEL PROBLEMS (unusual diarrhea, constipation, pain near the anus) TENDERNESS IN MOUTH AND THROAT WITH OR WITHOUT PRESENCE OF ULCERS (sore throat, sores in mouth, or a toothache) UNUSUAL RASH, SWELLING OR PAIN  UNUSUAL VAGINAL DISCHARGE OR ITCHING   Items with * indicate a potential emergency and should be followed up as soon as possible or go to the Emergency Department if any problems should occur.  Please show the CHEMOTHERAPY ALERT CARD or IMMUNOTHERAPY ALERT CARD at check-in to  the Emergency Department and triage nurse.  Should you have questions after your visit or need to cancel or reschedule your appointment, please contact [facility]  Dept: [phone]  and follow the prompts.  Office hours are 8:00 a.m. to 4:30 p.m. Monday - Friday. Please note that voicemails left after 4:00 p.m. may not be returned until the following business day.  We are closed weekends and major holidays. You have access to a nurse at all times for urgent questions. Please call the main number to the clinic Dept: [phone] and follow the prompts.   For any non-urgent questions, you may also contact your provider using MyChart. We now offer e-Visits for anyone 64 and older to request care online for non-urgent symptoms. For details visit [email or web address].   Also download the MyChart app! Go to the app store, search "MyChart", open the app, select [facility], and log in with your MyChart username and password.

## 2022-08-28 NOTE — Progress Notes (Signed)
[facility] Telephone:([phone]) [phone]   Fax:(336) [place], [place] [address]  DIAGNOSIS: Extensive stage (T2a, N3, [name], large right anterior mediastinal and supraclavicular lymphadenopathy as well as pancreatic and splenic metastasis diagnosed in September 2017.  The patient had disease progression in October 2019.  PRIOR THERAPY:  1) Systemic chemotherapy was carboplatin for AUC of 5 on day 1 and etoposide 100 MG/M2 on days 1, 2 and 3 with Neulasta support. Status post 6 cycles with significant response of her disease. 2) Prophylactic cranial irradiation under the care of Dr. Sondra Come on 12/02/2016. 3) stereotactic radiotherapy to the recurrent right upper lobe pulmonary nodule under the care of Dr. Sondra Come completed November 10, 2017. 4) Retreatment with systemic chemotherapy with carboplatin for AUC of 5 on day 1 and etoposide 100 mg/M2 on days 1, 2 and 3 as well as Tecentriq (Atezolizumab) 1200 mg IV every 3 weeks with Neulasta support.  First dose June 22, 2018 for disease recurrence.  Status post 5 cycles.  Starting from cycle #2 her dose of carboplatin will be reduced to AUC of 4 and etoposide 80 mg/M2 on days 1, 2 and 3 in addition to the regular dose of Tecentriq.  CURRENT THERAPY: Maintenance treatment with single agent Tecentriq 1200 mg IV every 3 weeks.  Status post 62 cycles.  INTERVAL HISTORY: Debra Kidd 61 y.o. female returns to the clinic today for follow-up visit.  The patient continues to complain of generalized fatigue and weakness as well as intermittent pain in the left arm.  She denied having any current chest pain but has a baseline shortness of breath with no cough or hemoptysis.  She has no nausea, vomiting, diarrhea or constipation.  She has no headache or visual changes.  She denied having any recent weight loss or  night sweats.  She is here today for evaluation before starting cycle #68 of her treatment with maintenance atezolizumab.   MEDICAL HISTORY: Past Medical History:  Diagnosis Date   Basal cell carcinoma of cheek    L side of face   Blood type, Rh positive    Cancer (HCC)    Chronic pain syndrome    Depression 08/07/2016   Encounter for antineoplastic chemotherapy 07/17/2016   History of external beam radiation therapy 11/19/16-12/02/16   brain 25 Gy in 10 fractions   Hyperlipidemia    Hypertension    Hypertension 08/07/2016   Osteoporosis    Small cell lung cancer ([place]) dx'd 05/2016    ALLERGIES:  is allergic to codeine, motrin [ibuprofen], thiazide-type diuretics, and vicodin [hydrocodone-acetaminophen].  MEDICATIONS:  Current Outpatient Medications  Medication Sig Dispense Refill   atorvastatin (LIPITOR) 80 MG tablet Take 1 tablet (80 mg total) by mouth daily. 30 tablet 11   clopidogrel (PLAVIX) 75 MG tablet Take 1 tablet (75 mg total) by mouth daily. 90 tablet 3   diazepam (VALIUM) 5 MG tablet Take 5 mg by mouth 3 (three) times daily as needed for anxiety.   2   lidocaine-prilocaine (EMLA) cream Apply 1 application. topically as needed. 30 g 2   potassium chloride 20 MEQ/15ML (10%) SOLN Take 15 mLs (20 mEq total) by mouth daily. (Patient not taking: Reported on 08/08/2022) 100 mL 0   traMADol (ULTRAM) 50 MG tablet Take 1 tablet (50 mg total) by mouth 4 (four) times daily as needed for  moderate pain (confirmed with her Pharmacy-Dr K. Little 03/15 she received #120). 30 tablet 0   Vitamin D, Ergocalciferol, [name],000 Units by mouth 3 (three) times a week.     No current facility-administered medications for this visit.   Facility-Administered Medications Ordered in Other Visits  Medication Dose Route Frequency Provider Last Rate Last Admin   sodium chloride flush (NS) 0.9 % injection 10 mL  10 mL Intracatheter PRN [name], MD   10  mL at 08/28/22 1024    SURGICAL HISTORY:  Past Surgical History:  Procedure Laterality Date   ABDOMINAL HYSTERECTOMY  1981   APPENDECTOMY     at age 1   EYE SURGERY     left   IR GENERIC HISTORICAL  06/03/2016   IR FLUORO GUIDE PORT INSERTION RIGHT 06/03/2016 [facility]   IR GENERIC HISTORICAL  06/03/2016   IR US GUIDE VASC ACCESS RIGHT 06/03/2016 [facility]   SKIN CANCER EXCISION     basal cell carcinoma L side of face    REVIEW OF SYSTEMS:  A comprehensive review of systems was negative except for: Constitutional: positive for fatigue Musculoskeletal: positive for bone pain   PHYSICAL EXAMINATION: General appearance: alert, cooperative, fatigued, and no distress Head: Normocephalic, without obvious abnormality, atraumatic Neck: no adenopathy, no JVD, supple, symmetrical, trachea midline, and thyroid not enlarged, symmetric, no tenderness/mass/nodules Lymph nodes: Cervical, supraclavicular, and axillary nodes normal. Resp: clear to auscultation bilaterally Back: symmetric, no curvature. ROM normal. No CVA tenderness. Cardio: regular rate and rhythm, S1, S2 normal, no murmur, click, rub or gallop GI: soft, non-tender; bowel sounds normal; no masses,  no organomegaly Extremities: extremities normal, atraumatic, no cyanosis or edema  ECOG PERFORMANCE STATUS: 1 - Symptomatic but completely ambulatory  Blood pressure 117/82, pulse 93, temperature 98.2 F (36.8 C), temperature source Oral, resp. rate 15, weight 95 lb 6.4 oz (43.3 kg), SpO2 97 %.  LABORATORY DATA: Lab Results  Component Value Date   WBC 6.3 08/28/2022   HGB 11.0 (L) 08/28/2022   HCT 31.8 (L) 08/28/2022   MCV 105.6 (H) 08/28/2022   PLT 247 08/28/2022      Chemistry      Component Value Date/Time   NA 137 08/28/2022 1024   NA 141 08/01/2017 0835   K 3.2 (L) 08/28/2022 1024   K 3.5 08/01/2017 0835   CL 103 08/28/2022 1024   CO2 29 08/28/2022 1024   CO2 25 08/01/2017 0835   BUN 7 (L) 08/28/2022 1024    BUN 8.0 08/01/2017 0835   CREATININE 0.71 08/28/2022 1024   CREATININE 0.9 08/01/2017 0835      Component Value Date/Time   CALCIUM 8.6 (L) 08/28/2022 1024   CALCIUM 9.9 08/01/2017 0835   ALKPHOS 131 (H) 08/28/2022 1024   ALKPHOS 142 08/01/2017 0835   AST 22 08/28/2022 1024   AST 15 08/01/2017 0835   ALT 11 08/28/2022 1024   ALT 19 08/01/2017 0835   BILITOT 0.4 08/28/2022 1024   BILITOT 0.35 08/01/2017 0835       RADIOGRAPHIC STUDIES: No results found.  ASSESSMENT AND PLAN:  This is a very pleasant 61 years old white female with extensive stage small cell lung cancer status post systemic chemotherapy with carbo platinum and etoposide for 6 cycles and the patient rotated her treatment well except for chemotherapy-induced anemia and requirement for PRBCs and platelet transfusion. She had significant improvement in her disease with the chemotherapy. She also had  prophylactic cranial irradiation. She also underwent stereotactic radiotherapy to progressive right upper lobe pulmonary nodule. The patient has been in observation for close to 2 years. Repeat CT scan of the chest, abdomen and pelvis performed recently showed evidence for disease progression in the abdomen. The patient started on systemic chemotherapy again with carboplatin, etoposide and Tecentriq status post 67 cycles.  Starting from cycle #5 the patient is treated with maintenance single agent Tecentriq. The patient has been tolerating this maintenance treatment fairly well with no concerning adverse effects. I recommended for her to proceed with cycle #68 today as planned. The patient will come back for follow-up visit in 3 weeks for evaluation with repeat CT scan of the chest, abdomen pelvis as well as scan of the left humerus to rule out metastatic bone disease in that area with her recurrent pain. For the history of stroke, she is followed by neurology and she is currently on Plavix and aspirin. The patient was  advised to call immediately if she has any other concerning symptoms in the interval. All questions were answered. The patient knows to call the clinic with any problems, questions or concerns. We can certainly see the patient much sooner if necessary.  Disclaimer: This note was dictated with voice recognition software. Similar sounding words can inadvertently be transcribed and may not be corrected upon review.

## 2022-08-28 NOTE — Progress Notes (Signed)
Brief nutrition follow-up completed with patient in waiting area prior to treatment.   Patient reports appetite has been getting better. She says she could not get enough [place] over the holiday. Patient recalls [place], potatoes, gravy, beans, mac/cheese, cranberries. She continues to drink Ensure. Patient has requested additional case as well as bag of food today.   Weight 95 lb 6.4 oz today stable 12/7 - 95 lb 3.2 oz 11/9 - 96 lb 6.4 oz   Provided one case Ensure Plus HP plus one bag of food from [facility] Items placed at check-in desk - pt is aware  Next Visit: Thursday February 11 during infusion

## 2022-08-29 ENCOUNTER — Other Ambulatory Visit: Payer: Self-pay

## 2022-09-09 ENCOUNTER — Other Ambulatory Visit: Payer: Self-pay

## 2022-09-09 ENCOUNTER — Telehealth: Payer: Self-pay | Admitting: Internal Medicine

## 2022-09-09 NOTE — Telephone Encounter (Signed)
Called patient regarding rescheduled 01/17 appointments, patient is notified of new appointment.

## 2022-09-10 ENCOUNTER — Other Ambulatory Visit: Payer: Self-pay

## 2022-09-11 ENCOUNTER — Telehealth: Payer: Self-pay | Admitting: *Deleted

## 2022-09-12 ENCOUNTER — Inpatient Hospital Stay: Payer: Medicare Other

## 2022-09-12 ENCOUNTER — Inpatient Hospital Stay: Payer: Medicare Other | Admitting: Dietician

## 2022-09-12 ENCOUNTER — Encounter: Payer: Self-pay | Admitting: Internal Medicine

## 2022-09-12 ENCOUNTER — Inpatient Hospital Stay: Payer: Medicare Other | Admitting: Physician Assistant

## 2022-09-12 NOTE — Telephone Encounter (Signed)
Opened in error

## 2022-09-13 ENCOUNTER — Other Ambulatory Visit: Payer: Self-pay | Admitting: Physician Assistant

## 2022-09-13 ENCOUNTER — Inpatient Hospital Stay: Payer: Medicare Other | Attending: Internal Medicine | Admitting: Licensed Clinical Social Worker

## 2022-09-13 ENCOUNTER — Ambulatory Visit (HOSPITAL_COMMUNITY): Payer: Medicare Other

## 2022-09-13 ENCOUNTER — Telehealth: Payer: Self-pay | Admitting: Medical Oncology

## 2022-09-13 DIAGNOSIS — Z85828 Personal history of other malignant neoplasm of skin: Secondary | ICD-10-CM | POA: Insufficient documentation

## 2022-09-13 DIAGNOSIS — C778 Secondary and unspecified malignant neoplasm of lymph nodes of multiple regions: Secondary | ICD-10-CM | POA: Insufficient documentation

## 2022-09-13 DIAGNOSIS — Z5112 Encounter for antineoplastic immunotherapy: Secondary | ICD-10-CM | POA: Insufficient documentation

## 2022-09-13 DIAGNOSIS — C7889 Secondary malignant neoplasm of other digestive organs: Secondary | ICD-10-CM | POA: Insufficient documentation

## 2022-09-13 DIAGNOSIS — C3491 Malignant neoplasm of unspecified part of right bronchus or lung: Secondary | ICD-10-CM

## 2022-09-13 DIAGNOSIS — E785 Hyperlipidemia, unspecified: Secondary | ICD-10-CM | POA: Insufficient documentation

## 2022-09-13 DIAGNOSIS — G894 Chronic pain syndrome: Secondary | ICD-10-CM | POA: Insufficient documentation

## 2022-09-13 DIAGNOSIS — C3411 Malignant neoplasm of upper lobe, right bronchus or lung: Secondary | ICD-10-CM | POA: Insufficient documentation

## 2022-09-13 DIAGNOSIS — Z7902 Long term (current) use of antithrombotics/antiplatelets: Secondary | ICD-10-CM | POA: Insufficient documentation

## 2022-09-13 DIAGNOSIS — I10 Essential (primary) hypertension: Secondary | ICD-10-CM | POA: Insufficient documentation

## 2022-09-13 DIAGNOSIS — M8008XA Age-related osteoporosis with current pathological fracture, vertebra(e), initial encounter for fracture: Secondary | ICD-10-CM | POA: Insufficient documentation

## 2022-09-13 MED ORDER — OXYCODONE-ACETAMINOPHEN 5-325 MG PO TABS: 1.0000 | ORAL_TABLET | Freq: Four times a day (QID) | ORAL | 0 refills | Status: DC | PRN

## 2022-09-13 NOTE — Progress Notes (Signed)
[facility] CSW Progress Note  Visual merchandiser  received a message that pt had cancelled her appointment for treatment.    CSW contacted pt who informed CSW that she had received a call from billing and was informed she needed to pay over $1,000 the next time she had treatment for past due bills.  CSW reassured pt she will always be able to receive her treatments regardless of any outstanding balance she may owe.  CSW also instructed pt to contact billing to discuss options to address debt.  Pt informed CSW of new concerning pain occurring in her left arm.  CSW informed RN who will reach out to pt for instruction.     Rachel Moulds, LCSW

## 2022-09-13 NOTE — Telephone Encounter (Signed)
Pain Management- tramadol does not relieve the  shoulder pain. She has 2 oxycodone from a  prescription on  09/05/2021 #30 Dr. Arbutus Ped. I told her that Cassie sent a prescription for oxycodone and if pain worsen or is not controlled to go to ED.

## 2022-09-13 NOTE — Telephone Encounter (Signed)
Per Ronda Fairly I called pt about left shoulder.  Pain L shoulder has been   "Hurting x 6 months where I got COVID injection. Yesterday took Tramadol , oxycodone 1/2 and oxycodone 1 tablet . Last oxycodone was 3 am then sweated a lot.  She put lidocaine strip  this morning and got a little relief.  Can't get up by herself.  Poor Memory-She said she forgot Jeff's phone number yesterday for the first time. She stops in mid sentence because she cannot remember the word to use.  09/13/2022-I told pt that oxycodone rx has been sent to [facility]

## 2022-09-15 NOTE — Progress Notes (Deleted)
[facility] OFFICE PROGRESS NOTE  Center, [facility] [address] [name] [place] [address]  DIAGNOSIS: Extensive stage (T2a, N3, [name], large right anterior mediastinal and supraclavicular lymphadenopathy as well as pancreatic and splenic metastasis diagnosed in September 2017.  The patient had disease progression in October 2019.  PRIOR THERAPY:  1) Systemic chemotherapy was carboplatin for AUC of 5 on day 1 and etoposide 100 MG/M2 on days 1, 2 and 3 with Neulasta support. Status post 6 cycles with significant response of her disease. 2) Prophylactic cranial irradiation under the care of Dr. Roselind Kidd on 12/02/2016. 3) stereotactic radiotherapy to the recurrent right upper lobe pulmonary nodule under the care of Dr. Roselind Kidd completed November 10, 2017. 4) Retreatment with systemic chemotherapy with carboplatin for AUC of 5 on day 1 and etoposide 100 mg/M2 on days 1, 2 and 3 as well as Tecentriq (Atezolizumab) 1200 mg IV every 3 weeks with Neulasta support.  First dose June 22, 2018 for disease recurrence.  Status post 5 cycles.  Starting from cycle #2 her dose of carboplatin will be reduced to AUC of 4 and etoposide 80 mg/M2 on days 1, 2 and 3 in addition to the regular dose of Tecentriq.  CURRENT THERAPY: Maintenance treatment with single agent Tecentriq 1200 mg IV every 3 weeks.  Status post 68 cycles.   INTERVAL HISTORY: Debra Kidd 62 y.o. female returns a follow-up visit accompanied by her husband. The patient is feeling fair today without any concerning complaints except for persistent fatigue and generalized weakness.  She is followed closely by social work and nutrition.  The patient is not very active and does not eat or drink a well-balanced diet.  She often receives complementary food items and supplemental protein drinks in our clinic to take home.   At the patient's last appointment with Dr. Arbutus Ped,  she was endorsing left arm pain.  Dr. Arbutus Ped added on a CT of the humerus to her most recent scan orders.  She had a prescription for tramadol which she was taking without any significant relief.  I sent a prescription for oxycodone last week which ***    Otherwise she denies any new concerns today.  The patient is currently undergoing treatment with Tecentriq. She tolerates this well except for dry skin and itching. She uses hydrocortisone cream. She denies any fever, chills, or night sweats.   She denies any nausea, vomiting, or constipation.  She sometimes has intermittent diarrhea.  She reports her baseline dyspnea on exertion which is stable without any chest pain or hemoptysis.  She reports a baseline mild cough and she continues to smoke approximately 2 packs of cigarettes per day.   She was supposed to have restaging CT scan performed today but is not scheduled until tomorrow.    MEDICAL HISTORY: Past Medical History:  Diagnosis Date   Basal cell carcinoma of cheek    L side of face   Blood type, Rh positive    Cancer (HCC)    Chronic pain syndrome    Depression 08/07/2016   Encounter for antineoplastic chemotherapy 07/17/2016   History of external beam radiation therapy 11/19/16-12/02/16   brain 25 Gy in 10 fractions   Hyperlipidemia    Hypertension    Hypertension 08/07/2016   Osteoporosis    Small cell lung cancer (HCC) dx'd 05/2016    ALLERGIES:  is allergic to codeine, motrin [ibuprofen], thiazide-type diuretics, and vicodin [hydrocodone-acetaminophen].  MEDICATIONS:  Current Outpatient Medications  Medication Sig Dispense Refill   atorvastatin (LIPITOR) 80 MG tablet Take 1 tablet (80 mg total) by mouth daily. 30 tablet 11   clopidogrel (PLAVIX) 75 MG tablet Take 1 tablet (75 mg total) by mouth daily. 90 tablet 3   diazepam (VALIUM) 5 MG tablet Take 5 mg by mouth 3 (three) times daily as needed for anxiety.   2   lidocaine-prilocaine (EMLA) cream Apply 1 application.  topically as needed. 30 g 2   oxyCODONE-acetaminophen (PERCOCET/ROXICET) 5-325 MG tablet Take 1 tablet by mouth every 6 (six) hours as needed for severe pain. 15 tablet 0   potassium chloride 20 MEQ/15ML (10%) SOLN Take 15 mLs (20 mEq total) by mouth daily. 100 mL 0   Vitamin D, Ergocalciferol, [name],000 Units by mouth 3 (three) times a week.     No current facility-administered medications for this visit.    SURGICAL HISTORY:  Past Surgical History:  Procedure Laterality Date   ABDOMINAL HYSTERECTOMY  1981   APPENDECTOMY     at age 48   EYE SURGERY     left   IR GENERIC HISTORICAL  06/03/2016   IR FLUORO GUIDE PORT INSERTION RIGHT 06/03/2016 [facility]   IR GENERIC HISTORICAL  06/03/2016   IR US GUIDE VASC ACCESS RIGHT 06/03/2016 [facility]   SKIN CANCER EXCISION     basal cell carcinoma L side of face    REVIEW OF SYSTEMS:   Review of Systems  Constitutional: Negative for appetite change, chills, fatigue, [name], nosebleeds, sore throat and trouble swallowing.   Eyes: Negative for eye problems and icterus.  Respiratory: Negative for cough, hemoptysis, [name], constipation, diarrhea, nausea and vomiting.  Genitourinary: Negative for bladder incontinence, difficulty urinating, dysuria, frequency and hematuria.   Musculoskeletal: Negative for back pain, gait problem, neck pain and neck stiffness.  Skin: Negative for itching and rash.  Neurological: Negative for dizziness, extremity weakness, gait problem, headaches, [name], depression and sleep disturbance. The patient is not nervous/anxious.      PHYSICAL EXAMINATION:  There were no vitals taken for this visit.  ECOG PERFORMANCE STATUS: {CHL ONC ECOG [id number]  Physical Exam  Constitutional: Oriented to person, place, and time and well-developed, well-nourished, and in no distress. No distress.  HENT:  Head: Normocephalic and atraumatic.  Mouth/Throat: Oropharynx is clear and moist. No oropharyngeal exudate.  Eyes: Conjunctivae are normal. Right eye exhibits no discharge. Left eye exhibits no discharge. No scleral icterus.  Neck: Normal range of motion. Neck supple.  Cardiovascular: Normal rate, regular rhythm, normal heart sounds and intact distal pulses.   Pulmonary/Chest: Effort normal and breath sounds normal. No respiratory distress. No wheezes. No rales.  Abdominal: Soft. Bowel sounds are normal. Exhibits no distension and no mass. There is no tenderness.  Musculoskeletal: Normal range of motion. Exhibits no edema.  Lymphadenopathy:    No cervical adenopathy.  Neurological: Alert and oriented to person, place, and time. Exhibits normal muscle tone. Gait normal. Coordination normal.  Skin: Skin is warm and dry. No rash noted. Not diaphoretic. No erythema. No pallor.  Psychiatric: Mood, memory and judgment normal.  Vitals reviewed.  LABORATORY DATA: Lab Results  Component Value Date   WBC 6.3 08/28/2022   HGB 11.0 (L) 08/28/2022   HCT 31.8 (L) 08/28/2022   MCV 105.6 (H) 08/28/2022   PLT 247 08/28/2022      Chemistry      Component Value Date/Time   NA 137 08/28/2022 1024   NA 141 08/01/2017 0835   K 3.2 (L) 08/28/2022 1024   K 3.5 08/01/2017 0835   CL 103 08/28/2022 1024   CO2 29 08/28/2022 1024   CO2 25 08/01/2017 0835   BUN 7 (L) 08/28/2022 1024   BUN 8.0 08/01/2017 0835   CREATININE 0.71 08/28/2022 1024   CREATININE 0.9 08/01/2017 0835      Component Value Date/Time   CALCIUM 8.6 (L) 08/28/2022 1024   CALCIUM 9.9 08/01/2017 0835   ALKPHOS 131 (H) 08/28/2022 1024   ALKPHOS 142 08/01/2017  0835   AST 22 08/28/2022 1024   AST 15 08/01/2017 0835   ALT 11 08/28/2022 1024   ALT 19 08/01/2017 0835   BILITOT 0.4 08/28/2022 1024   BILITOT 0.35 08/01/2017 0835       RADIOGRAPHIC STUDIES:  No results found.   ASSESSMENT/PLAN:  This is a very pleasant 62 year old Caucasian female with extensive stage small cell lung cancer.  She presented with a right upper lobe lung mass, large right anterior mediastinal and supraclavicular lymphadenopathy as well as a pancreatic and splenic metastasis.  She was diagnosed in September 2017.    The patient underwent systemic chemotherapy with carboplatin and etoposide.  She is status post 6 cycles.  She tolerated treatment well except for chemotherapy-induced anemia which required  pRBCs and platelet transfusions.  She had a significant improvement of her disease with chemotherapy. The patient then underwent prophylactic cranial irradiation. She then underwent stereotactic radiotherapy to the right upper lobe and pulmonary nodule.   She had been on observation for 2 years before showing evidence of disease progression.    She then was started on systemic chemotherapy with carboplatin, etoposide, and Tecentriq. Starting from cycle #5, she has been on maintenance single agent Tecentriq.  She has been tolerating treatment well. She is status post 68  cycles of Tecentriq total.   The patient was supposed a restaging CT scan of the chest, abdomen, pelvis, and humerus prior to her appointment today but is scheduled for tomorrow.  Labs were reviewed.  Recommend that she proceed with cycle #69 as scheduled.  We will call her if there is concerns for disease progression on her upcoming scan.  If she has a metastatic lesion to the humerus, we can always refer her to radiation oncology for palliative radiation.  She will continue to follow with nutrition, social work in the [employer].  The patient has been encouraged to quit smoking numerous  times at prior appointments.  Oxycodone  The patient was advised to call immediately if she has any concerning symptoms in the interval. The patient voices understanding of current disease status and treatment options and is in agreement with the current care plan. All questions were answered. The patient knows to call the clinic with any problems, questions or concerns. We can certainly see the patient much sooner if necessary         No orders of the defined types were placed in this encounter.    I spent {CHL ONC TIME VISIT - HYQMV:[id number]} counseling the patient face to face. The total time spent in the appointment was {CHL ONC TIME VISIT - WUXLK:[id number]}.  [place],  PA-C 09/15/22

## 2022-09-16 ENCOUNTER — Encounter: Payer: Self-pay | Admitting: Internal Medicine

## 2022-09-16 ENCOUNTER — Inpatient Hospital Stay: Payer: Medicare Other | Admitting: Licensed Clinical Social Worker

## 2022-09-16 DIAGNOSIS — C3491 Malignant neoplasm of unspecified part of right bronchus or lung: Secondary | ICD-10-CM

## 2022-09-16 NOTE — Progress Notes (Signed)
[facility] CSW Progress Note  Clinical Social Worker  called pt to follow up regarding hospital bills.  Pt states she has contacted billing and they are mailing her a packet to apply for a hardship settlement.  Pt to submit required documentation once packet is received.  CSW to continue to follow as appropriate throughout duration of treatment.      Rachel Moulds, LCSW

## 2022-09-17 ENCOUNTER — Inpatient Hospital Stay: Payer: Medicare Other | Admitting: Physician Assistant

## 2022-09-17 ENCOUNTER — Inpatient Hospital Stay: Payer: Medicare Other

## 2022-09-17 ENCOUNTER — Inpatient Hospital Stay: Payer: Medicare Other | Admitting: Dietician

## 2022-09-17 NOTE — Progress Notes (Signed)
[facility] OFFICE PROGRESS NOTE  Pcp, [name], N3, [name], large right anterior mediastinal and supraclavicular lymphadenopathy as well as pancreatic and splenic metastasis diagnosed in September 2017.  The patient had disease progression in October 2019.   PRIOR THERAPY: 1) Systemic chemotherapy was carboplatin for AUC of 5 on day 1 and etoposide 100 MG/M2 on days 1, 2 and 3 with Neulasta support. Status post 6 cycles with significant response of her disease. 2) Prophylactic cranial irradiation under the care of Dr. Roselind Messier on 12/02/2016. 3) stereotactic radiotherapy to the recurrent right upper lobe pulmonary nodule under the care of Dr. Roselind Messier completed November 10, 2017. 4) Retreatment with systemic chemotherapy with carboplatin for AUC of 5 on day 1 and etoposide 100 mg/M2 on days 1, 2 and 3 as well as Tecentriq (Atezolizumab) 1200 mg IV every 3 weeks with Neulasta support.  First dose June 22, 2018 for disease recurrence.  Status post 5 cycles.  Starting from cycle #2 her dose of carboplatin will be reduced to AUC of 4 and etoposide 80 mg/M2 on days 1, 2 and 3 in addition to the regular dose of Tecentriq.  CURRENT THERAPY: Maintenance treatment with single agent Tecentriq 1200 mg IV every 3 weeks.  Status post 67 cycles.   INTERVAL HISTORY: Debra Kidd 62 y.o. female returns to the clinic today for a follow-up visit accompanied by her husband. The patient is feeling fair today without any concerning complaints except she has had significant pain in her left upper extremity/shoulder. She localizes her pain to the upper/lateral part of her humerus where you would receive a vaccine. The pain at times could be excruciating and interfere with her ability to sleep. She is having a hard time lifting//abducting her left arm. She denies falls, traumas, or heavy lifting.  She  denies any overlying skin changes, erythema, or swelling.  She has tried Tylenol, an old prescription of tramadol, and Lidoderm patches without any significant relief.  The Lidoderm patches helped "a little".  She was given oxycodone until she can have her CT of the humerus performed which takes her pain level down to a 0.  The patient does not have a PCP and was discharged from the practice.  The patient has some baseline weakness in her left extremities due to a prior stroke but denies any changes in her weakness, sensation in her upper extremity, or downstream pallor.  Her left hand is well-perfused and warm.  The patient has some significant vascular stenosis and history of strokes for which she sees neurology.  She mentions she feels like she has been more forgetful.   Patient reports baseline persistent fatigue and generalized weakness.  She is followed closely by social work and nutrition.  The patient is not very active and does not eat or drink a well-balanced diet.  She often receives complementary food items and supplemental protein drinks in our clinic to take home.  She is scheduled to see the nutritionist at her next appointment.  She also had overdue bills at the cancer center.  I received notification a few days ago that her balance will be wiped clean.   The patient had been off treatment from August 2023- November 2023 for flu like symptoms.   Otherwise she denies any new concerns today.  The patient is currently undergoing treatment with Tecentriq. She tolerates this well except for  dry skin and itching. She uses hydrocortisone cream. She denies any fever, chills, or night sweats.   She denies any nausea, vomiting, or constipation.  She sometimes has intermittent diarrhea but denies any recently. She has frequent hypokalemia on labs likely because of this.   She reports her baseline dyspnea on exertion which is stable without any chest pain or hemoptysis.  She reports a baseline mild cough  and she continues to smoke approximately 2 packs of cigarettes per day.  She recently had a restaging CT scan of the chest, abdomen, pelvis, and humerus.   She is here today for evaluation and repeat blood work before undergoing cycle #69.    MEDICAL HISTORY: Past Medical History:  Diagnosis Date   Basal cell carcinoma of cheek    L side of face   Blood type, Rh positive    Cancer (HCC)    Chronic pain syndrome    Depression 08/07/2016   Encounter for antineoplastic chemotherapy 07/17/2016   History of external beam radiation therapy 11/19/16-12/02/16   brain 25 Gy in 10 fractions   Hyperlipidemia    Hypertension    Hypertension 08/07/2016   Osteoporosis    Small cell lung cancer (HCC) dx'd 05/2016    ALLERGIES:  is allergic to codeine, motrin [ibuprofen], thiazide-type diuretics, and vicodin [hydrocodone-acetaminophen].  MEDICATIONS:  Current Outpatient Medications  Medication Sig Dispense Refill   atorvastatin (LIPITOR) 80 MG tablet Take 1 tablet (80 mg total) by mouth daily. 30 tablet 11   clopidogrel (PLAVIX) 75 MG tablet Take 1 tablet (75 mg total) by mouth daily. 90 tablet 3   diazepam (VALIUM) 5 MG tablet Take 5 mg by mouth 3 (three) times daily as needed for anxiety.   2   lidocaine-prilocaine (EMLA) cream Apply 1 Application topically as needed. 30 g 2   oxyCODONE-acetaminophen (PERCOCET/ROXICET) 5-325 MG tablet Take 1 tablet by mouth every 6 (six) hours as needed for severe pain. 15 tablet 0   potassium chloride 20 MEQ/15ML (10%) SOLN Take 15 mLs (20 mEq total) by mouth daily. 80 mL 0   Vitamin D, Ergocalciferol, [name],000 Units by mouth 3 (three) times a week.     No current facility-administered medications for this visit.   Facility-Administered Medications Ordered in Other Visits  Medication Dose Route Frequency Provider Last Rate Last Admin   heparin lock flush 100 unit/mL  500 Units Intravenous Once Si Gaul, MD        iohexol (OMNIPAQUE) 9 MG/ML oral solution             SURGICAL HISTORY:  Past Surgical History:  Procedure Laterality Date   ABDOMINAL HYSTERECTOMY  1981   APPENDECTOMY     at age 69   EYE SURGERY     left   IR GENERIC HISTORICAL  06/03/2016   IR FLUORO GUIDE PORT INSERTION RIGHT 06/03/2016 [facility]   IR GENERIC HISTORICAL  06/03/2016   IR US GUIDE VASC ACCESS RIGHT 06/03/2016 [facility]   SKIN CANCER EXCISION     basal cell carcinoma L side of face    REVIEW OF SYSTEMS:   Review of Systems  Constitutional: Positive for decreased appetite and fatigue. Negative for  chills, fever and unexpected weight change.  HENT: Negative for mouth sores, nosebleeds, sore throat and trouble swallowing.   Eyes: Negative for eye problems and icterus.  Respiratory: Positive for SOB and mild cough which are her baseline. Negative for hemoptysis, and wheezing.  Cardiovascular: Negative for chest pain and leg swelling.  Gastrointestinal: Positive for intermittent chronic diarrhea (none at this time).  Negative for abdominal pain, constipation,  nausea and vomiting.  Genitourinary: Negative for bladder incontinence, difficulty urinating, dysuria, frequency and hematuria.   Musculoskeletal: Positive for chronic back pain.  Positive for left upper extremity pain.  Negative for gait problem, neck pain and neck stiffness.  Skin: Positive for itching. Negative for rash.  Neurological: Positive for generalized weakness.  Positive for left-sided weakness due to her history of stroke.  Negative for extremity weakness, gait problem, [name], depression and sleep disturbance. The patient is not nervous/anxious   PHYSICAL EXAMINATION:  Blood pressure 93/65, pulse 85, temperature 98 F (36.7 C), temperature source Oral, resp. rate 15, weight 97 lb 6.4 oz (44.2 kg),  SpO2 99 %.  ECOG PERFORMANCE STATUS: 2-3  Physical Exam  Constitutional: Oriented to person, place, and time and chronically ill appearing female appears older than stated age and in no distress. HENT: Head: Normocephalic and atraumatic. Mouth/Throat: Oropharynx is clear and moist. No oropharyngeal exudate. Eyes: Conjunctivae are normal. Right eye exhibits no discharge. Left eye exhibits no discharge. No scleral icterus. Neck: Normal range of motion. Neck supple. Cardiovascular: Normal rate, regular rhythm, normal heart sounds and intact distal pulses.   Pulmonary/Chest: Effort normal.  Quiet breath sounds in all lung fields.  No respiratory distress. No wheezes. No rales. Abdominal: Soft. Bowel sounds are normal. Exhibits no distension and no mass. There is no tenderness.  Musculoskeletal: She has limited range of motion in her left shoulder secondary to pain.  Hands are well-perfused and warm bilaterally.  Exhibits no edema.  Newness to palpation over the lateral aspect of the upper humerus. Lymphadenopathy:    No cervical adenopathy.  Neurological: Alert and oriented to person, place, and time. Exhibits muscle wasting. Examined in the wheelchair. Chronic left sided weakness due to hx of CVA.  Skin: Skin is warm and dry. No rash noted. Not diaphoretic. No erythema. No pallor.  Psychiatric: Mood, memory and judgment normal. Vitals reviewed.  LABORATORY DATA: Lab Results  Component Value Date   WBC 6.2 09/18/2022   HGB 11.1 (L) 09/18/2022   HCT 31.5 (L) 09/18/2022   MCV 107.5 (H) 09/18/2022   PLT 221 09/18/2022      Chemistry      Component Value Date/Time   NA 135 09/18/2022 1226   NA 141 08/01/2017 0835   K 3.3 (L) 09/18/2022 1226   K 3.5 08/01/2017 0835   CL 102 09/18/2022 1226   CO2 29 09/18/2022 1226   CO2 25 08/01/2017 0835   BUN 5 (L) 09/18/2022 1226   BUN 8.0 08/01/2017 0835   CREATININE 0.60 09/18/2022 1226   CREATININE 0.9 08/01/2017 0835      Component Value  Date/Time   CALCIUM 8.4 (L) 09/18/2022 1226   CALCIUM 9.9 08/01/2017 0835   ALKPHOS 110 09/18/2022 1226   ALKPHOS 142 08/01/2017 0835   AST 17 09/18/2022 1226   AST 15 08/01/2017 0835   ALT 9 09/18/2022 1226   ALT 19 08/01/2017 0835   BILITOT 0.6 09/18/2022 1226   BILITOT 0.35 08/01/2017 0835       RADIOGRAPHIC STUDIES:  CT Chest W Contrast  Result Date: 09/18/2022 CLINICAL DATA:  Small-cell lung cancer. Restaging. * Tracking Code: BO * prior appendectomy and hysterectomy. EXAM: CT CHEST, ABDOMEN, AND PELVIS WITH CONTRAST TECHNIQUE:  Multidetector CT imaging of the chest, abdomen and pelvis was performed following the standard protocol during bolus administration of intravenous contrast. RADIATION DOSE REDUCTION: This exam was performed according to the departmental dose-optimization program which includes automated exposure control, adjustment of the mA and/or kV according to patient size and/or use of iterative reconstruction technique. CONTRAST:  52mL OMNIPAQUE IOHEXOL 300 MG/ML  SOLN COMPARISON:  04/21/2022 FINDINGS: CT CHEST FINDINGS Cardiovascular: The heart size is normal. No substantial pericardial effusion. Coronary artery calcification is evident. Moderate atherosclerotic calcification is noted in the wall of the thoracic aorta. 9 mm ulcerated plaque/penetrating ulcer along the lateral transverse aorta is stable in the interval. Right Port-A-Cath tip is positioned at the SVC/RA junction. Mediastinum/Nodes: No mediastinal lymphadenopathy. There is no hilar lymphadenopathy. The esophagus has normal imaging features. There is no axillary lymphadenopathy. Lungs/Pleura: Stable pleuroparenchymal scarring in the anterior right upper lobe compatible with sequelae of radiation therapy. Stable curvilinear atelectasis or scarring in the posterior right lower lobe. No new suspicious pulmonary nodule or mass. No focal airspace consolidation. No pleural effusion. Musculoskeletal: No worrisome lytic or  sclerotic osseous abnormality. CT ABDOMEN PELVIS FINDINGS Hepatobiliary: No suspicious focal abnormality within the liver parenchyma. There is no evidence for gallstones, gallbladder wall thickening, or pericholecystic fluid. No intrahepatic or extrahepatic biliary dilation. Pancreas: No focal mass lesion. No dilatation of the main duct. No intraparenchymal cyst. No peripancreatic edema. Spleen: No splenomegaly. No focal mass lesion. Adrenals/Urinary Tract: No adrenal nodule or mass. Kidneys unremarkable. No evidence for hydroureter. The urinary bladder appears normal for the degree of distention. Stomach/Bowel: Stomach is unremarkable. No gastric wall thickening. No evidence of outlet obstruction. Duodenum is normally positioned as is the ligament of Treitz. No small bowel wall thickening. No small bowel dilatation. The terminal ileum is normal. Nonvisualization of the appendix is consistent with the reported history of appendectomy. No gross colonic mass. No colonic wall thickening. Vascular/Lymphatic: There is moderate atherosclerotic calcification of the abdominal aorta without aneurysm. There is no gastrohepatic or hepatoduodenal ligament lymphadenopathy. No retroperitoneal or mesenteric lymphadenopathy. No pelvic sidewall lymphadenopathy. Reproductive: Hysterectomy.  There is no adnexal mass. Other: No intraperitoneal free fluid. Musculoskeletal: No worrisome lytic or sclerotic osseous abnormality. Stable superior endplate compression deformity at L2. IMPRESSION: 1. Stable exam. No new or progressive findings to suggest recurrent or metastatic disease in the chest, abdomen, or pelvis. 2. Stable pleuroparenchymal scarring in the anterior right upper lobe compatible with sequelae of radiation therapy. 3. Stable 9 mm ulcerated plaque/penetrating ulcer along the lateral transverse aorta. Continued attention on follow-up recommended. 4.  Aortic Atherosclerosis (ICD10-I70.0). Electronically Signed   By: Kennith Center M.D.   On: 09/18/2022 11:57   CT Abdomen Pelvis W Contrast  Result Date: 09/18/2022 CLINICAL DATA:  Small-cell lung cancer. Restaging. * Tracking Code: BO * prior appendectomy and hysterectomy. EXAM: CT CHEST, ABDOMEN, AND PELVIS WITH CONTRAST TECHNIQUE: Multidetector CT imaging of the chest, abdomen and pelvis was performed following the standard protocol during bolus administration of intravenous contrast. RADIATION DOSE REDUCTION: This exam was performed according to the departmental dose-optimization program which includes automated exposure control, adjustment of the mA and/or kV according to patient size and/or use of iterative reconstruction technique. CONTRAST:  79mL OMNIPAQUE IOHEXOL 300 MG/ML  SOLN COMPARISON:  04/21/2022 FINDINGS: CT CHEST FINDINGS Cardiovascular: The heart size is normal. No substantial pericardial effusion. Coronary artery calcification is evident. Moderate atherosclerotic calcification is noted in the wall of the thoracic aorta. 9 mm ulcerated plaque/penetrating ulcer along the lateral  transverse aorta is stable in the interval. Right Port-A-Cath tip is positioned at the SVC/RA junction. Mediastinum/Nodes: No mediastinal lymphadenopathy. There is no hilar lymphadenopathy. The esophagus has normal imaging features. There is no axillary lymphadenopathy. Lungs/Pleura: Stable pleuroparenchymal scarring in the anterior right upper lobe compatible with sequelae of radiation therapy. Stable curvilinear atelectasis or scarring in the posterior right lower lobe. No new suspicious pulmonary nodule or mass. No focal airspace consolidation. No pleural effusion. Musculoskeletal: No worrisome lytic or sclerotic osseous abnormality. CT ABDOMEN PELVIS FINDINGS Hepatobiliary: No suspicious focal abnormality within the liver parenchyma. There is no evidence for gallstones, gallbladder wall thickening, or pericholecystic fluid. No intrahepatic or extrahepatic biliary dilation. Pancreas: No  focal mass lesion. No dilatation of the main duct. No intraparenchymal cyst. No peripancreatic edema. Spleen: No splenomegaly. No focal mass lesion. Adrenals/Urinary Tract: No adrenal nodule or mass. Kidneys unremarkable. No evidence for hydroureter. The urinary bladder appears normal for the degree of distention. Stomach/Bowel: Stomach is unremarkable. No gastric wall thickening. No evidence of outlet obstruction. Duodenum is normally positioned as is the ligament of Treitz. No small bowel wall thickening. No small bowel dilatation. The terminal ileum is normal. Nonvisualization of the appendix is consistent with the reported history of appendectomy. No gross colonic mass. No colonic wall thickening. Vascular/Lymphatic: There is moderate atherosclerotic calcification of the abdominal aorta without aneurysm. There is no gastrohepatic or hepatoduodenal ligament lymphadenopathy. No retroperitoneal or mesenteric lymphadenopathy. No pelvic sidewall lymphadenopathy. Reproductive: Hysterectomy.  There is no adnexal mass. Other: No intraperitoneal free fluid. Musculoskeletal: No worrisome lytic or sclerotic osseous abnormality. Stable superior endplate compression deformity at L2. IMPRESSION: 1. Stable exam. No new or progressive findings to suggest recurrent or metastatic disease in the chest, abdomen, or pelvis. 2. Stable pleuroparenchymal scarring in the anterior right upper lobe compatible with sequelae of radiation therapy. 3. Stable 9 mm ulcerated plaque/penetrating ulcer along the lateral transverse aorta. Continued attention on follow-up recommended. 4.  Aortic Atherosclerosis (ICD10-I70.0). Electronically Signed   By: Misty Stanley M.D.   On: 09/18/2022 11:57   CT Humerus Left Wo Contrast  Result Date: 09/18/2022 CLINICAL DATA:  Upper arm pain, stress fracture suspected. No prior imaging EXAM: CT OF THE UPPER LEFT EXTREMITY WITHOUT CONTRAST TECHNIQUE: Multidetector CT imaging of the upper left extremity was  performed according to the standard protocol. RADIATION DOSE REDUCTION: This exam was performed according to the departmental dose-optimization program which includes automated exposure control, adjustment of the mA and/or kV according to patient size and/or use of iterative reconstruction technique. COMPARISON:  None Available. FINDINGS: Bones/Joint/Cartilage No evidence of acute fracture or dislocation. Mild glenohumeral joint space narrowing. Mild arthritic changes of the acromioclavicular joint. The elbow joint appear intact. Ligaments Suboptimally assessed by CT. Muscles and Tendons Mild generalized muscle atrophy. No intramuscular hematoma or fluid collection Soft tissues Skin and subcutaneous soft tissues are within normal limits. Partially imaged lung is clear. IMPRESSION: 1. No evidence of acute fracture or dislocation. 2. Mild arthritic changes of the glenohumeral and acromioclavicular joints. 3. Mild generalized muscle atrophy. Electronically Signed   By: Keane Police D.O.   On: 09/18/2022 11:53     ASSESSMENT/PLAN:  This is a very pleasant 62 year old Caucasian female with extensive stage small cell lung cancer.  She presented with a right upper lobe lung mass, large right anterior mediastinal and supraclavicular lymphadenopathy as well as a pancreatic and splenic metastasis.  She was diagnosed in September 2017.    The patient underwent systemic chemotherapy with carboplatin and  etoposide.  She is status post 6 cycles.  She tolerated treatment well except for chemotherapy-induced anemia which required  pRBCs and platelet transfusions.  She had a significant improvement of her disease with chemotherapy. The patient then underwent prophylactic cranial irradiation. She then underwent stereotactic radiotherapy to the right upper lobe and pulmonary nodule.  She had been on observation for 2 years before showing evidence of disease progression.    She then was started on systemic chemotherapy with  carboplatin, etoposide, and Tecentriq. Starting from cycle #5, she has been on maintenance single agent Tecentriq.  She has been tolerating treatment well. She is status post 68  cycles of Tecentriq total.  Patient recently had a restaging CT scan performed.  Dr. Arbutus Ped personally independently reviewed the scan and discussed results with the patient today.  The scan did not show any evidence of disease progression.  Regarding the left humerus, there is no evidence of metastatic disease to this region.  There was some mild arthritic changes in the glenohumeral and acromioclavicular joints.  Although her pain seems to be slightly lower than this.  Dr. Arbutus Ped recommends that she continue on the same treatment at the same dose.  Her blood pressure is low at 93/62 today.  The patient sees 1 L fluid over 2 hours in the infusion room today.  We will also see if the member the nutritionist team can evaluate her while in the infusion room today.  Otherwise, she is scheduled to see them at her next appointment.  The patient will continue taking oxycodone for now until she can be evaluated by orthopedics.    I will refill Emla cream.  I also sent a short course of liquid potassium for the next 5 days.   We will see her back for a follow up visit in 3 weeks for evaluation and repeat blood work before starting cycle #70    The patient understands that we will not be refilling her oxycodone long-term.  I may send her 1 more prescription going into the weekend on Friday, 09/20/2022.  If persistent pain, we may need to refer her to a pain clinic.  The patient currently does not have a PCP as she was discharged from practice a few months ago.  The patient was advised to call immediately if she has any concerning symptoms in the interval. The patient voices understanding of current disease status and treatment options and is in agreement with the current care plan. All questions were answered. The patient knows  to call the clinic with any problems, questions or concerns. We can certainly see the patient much sooner if necessary    Orders Placed This Encounter  Procedures   AMB referral to orthopedics    Referral Priority:   Routine    Referral Type:   Consultation    Number of Visits Requested:   1     Debra Elie L Tryphena Perkovich, PA-C 09/18/22   ADDENDUM: Hematology/Oncology Attending:  I had a face-to-face encounter with the patient today.  I reviewed her records, lab, scan and recommended her care plan.  This is a very pleasant 62 years old white female with extensive stage small cell lung cancer that was initially diagnosed diagnosed in September 2017 status post several course of systemic chemotherapy with carboplatin and etoposide as well as immunotherapy with atezolizumab.  The patient has been on maintenance treatment with atezolizumab status post 68 cycles and has been tolerating this treatment fairly well. She had repeat CT scan of  the chest, abdomen and pelvis as well as scan of the left arm performed recently.  I personally and independently reviewed the scan and discussed the result with the patient today. Her scan showed no concerning findings for disease progression and no concerning finding in the left arm to explain her persistent pain except for arthritis. I recommended for the patient to continue her current treatment with maintenance atezolizumab as planned. We recommended for the patient to see orthopedic surgery for evaluation of her arm issues but we gave her limited amount of pain medication until she establish care with a primary care physician who has been helping her with her management in the past. The patient will come back for follow-up visit in 3 weeks for evaluation before the next cycle of her treatment. She was advised to call immediately if she has any other concerning symptoms in the interval. The total time spent in the appointment was 30 minutes. Disclaimer:  This note was dictated with voice recognition software. Similar sounding words can inadvertently be transcribed and may be missed upon review.

## 2022-09-18 ENCOUNTER — Inpatient Hospital Stay: Payer: Medicare Other

## 2022-09-18 ENCOUNTER — Inpatient Hospital Stay: Payer: Medicare Other | Admitting: Dietician

## 2022-09-18 ENCOUNTER — Ambulatory Visit (HOSPITAL_COMMUNITY)
Admission: RE | Admit: 2022-09-18 | Discharge: 2022-09-18 | Disposition: A | Discharge status: Home / Self Care | Payer: Medicare Other | Source: Ambulatory Visit | Attending: Internal Medicine | Admitting: Internal Medicine

## 2022-09-18 ENCOUNTER — Inpatient Hospital Stay: Payer: Medicare Other | Admitting: Internal Medicine

## 2022-09-18 ENCOUNTER — Inpatient Hospital Stay (HOSPITAL_BASED_OUTPATIENT_CLINIC_OR_DEPARTMENT_OTHER): Payer: Medicare Other | Admitting: Physician Assistant

## 2022-09-18 VITALS — BP 107/70 | HR 76 | Temp 98.2°F | Resp 16

## 2022-09-18 VITALS — BP 93/65 | HR 85 | Temp 98.0°F | Resp 15 | Wt 97.4 lb

## 2022-09-18 DIAGNOSIS — I959 Hypotension, unspecified: Secondary | ICD-10-CM

## 2022-09-18 DIAGNOSIS — M25512 Pain in left shoulder: Secondary | ICD-10-CM

## 2022-09-18 DIAGNOSIS — E876 Hypokalemia: Secondary | ICD-10-CM

## 2022-09-18 DIAGNOSIS — C3490 Malignant neoplasm of unspecified part of unspecified bronchus or lung: Secondary | ICD-10-CM | POA: Insufficient documentation

## 2022-09-18 DIAGNOSIS — C7889 Secondary malignant neoplasm of other digestive organs: Secondary | ICD-10-CM | POA: Diagnosis not present

## 2022-09-18 DIAGNOSIS — C3491 Malignant neoplasm of unspecified part of right bronchus or lung: Secondary | ICD-10-CM

## 2022-09-18 DIAGNOSIS — Z7902 Long term (current) use of antithrombotics/antiplatelets: Secondary | ICD-10-CM | POA: Diagnosis not present

## 2022-09-18 DIAGNOSIS — Z5112 Encounter for antineoplastic immunotherapy: Secondary | ICD-10-CM | POA: Diagnosis not present

## 2022-09-18 DIAGNOSIS — E785 Hyperlipidemia, unspecified: Secondary | ICD-10-CM | POA: Diagnosis not present

## 2022-09-18 DIAGNOSIS — C778 Secondary and unspecified malignant neoplasm of lymph nodes of multiple regions: Secondary | ICD-10-CM | POA: Diagnosis not present

## 2022-09-18 DIAGNOSIS — I10 Essential (primary) hypertension: Secondary | ICD-10-CM | POA: Diagnosis not present

## 2022-09-18 DIAGNOSIS — C3411 Malignant neoplasm of upper lobe, right bronchus or lung: Secondary | ICD-10-CM | POA: Diagnosis not present

## 2022-09-18 DIAGNOSIS — M8008XA Age-related osteoporosis with current pathological fracture, vertebra(e), initial encounter for fracture: Secondary | ICD-10-CM | POA: Diagnosis not present

## 2022-09-18 DIAGNOSIS — Z85828 Personal history of other malignant neoplasm of skin: Secondary | ICD-10-CM | POA: Diagnosis not present

## 2022-09-18 DIAGNOSIS — G894 Chronic pain syndrome: Secondary | ICD-10-CM | POA: Diagnosis not present

## 2022-09-18 LAB — CBC WITH DIFFERENTIAL (CANCER CENTER ONLY)
Abs Immature Granulocytes: 0.01 10*3/uL (ref 0.00–0.07)
Basophils Absolute: 0 10*3/uL (ref 0.0–0.1)
Basophils Relative: 1 %
Eosinophils Absolute: 0.1 10*3/uL (ref 0.0–0.5)
Eosinophils Relative: 2 %
HCT: 31.5 % — ABNORMAL LOW (ref 36.0–46.0)
Hemoglobin: 11.1 g/dL — ABNORMAL LOW (ref 12.0–15.0)
Immature Granulocytes: 0 %
Lymphocytes Relative: 30 %
Lymphs Abs: 1.8 10*3/uL (ref 0.7–4.0)
MCH: 37.9 pg — ABNORMAL HIGH (ref 26.0–34.0)
MCHC: 35.2 g/dL (ref 30.0–36.0)
MCV: 107.5 fL — ABNORMAL HIGH (ref 80.0–100.0)
Monocytes Absolute: 0.3 10*3/uL (ref 0.1–1.0)
Monocytes Relative: 5 %
Neutro Abs: 3.9 10*3/uL (ref 1.7–7.7)
Neutrophils Relative %: 62 %
Platelet Count: 221 10*3/uL (ref 150–400)
RBC: 2.93 MIL/uL — ABNORMAL LOW (ref 3.87–5.11)
RDW: 15.2 % (ref 11.5–15.5)
WBC Count: 6.2 10*3/uL (ref 4.0–10.5)
nRBC: 0 % (ref 0.0–0.2)

## 2022-09-18 LAB — CMP (CANCER CENTER ONLY)
ALT: 9 U/L (ref 0–44)
AST: 17 U/L (ref 15–41)
Albumin: 3.2 g/dL — ABNORMAL LOW (ref 3.5–5.0)
Alkaline Phosphatase: 110 U/L (ref 38–126)
Anion gap: 4 — ABNORMAL LOW (ref 5–15)
BUN: 5 mg/dL — ABNORMAL LOW (ref 8–23)
CO2: 29 mmol/L (ref 22–32)
Calcium: 8.4 mg/dL — ABNORMAL LOW (ref 8.9–10.3)
Chloride: 102 mmol/L (ref 98–111)
Creatinine: 0.6 mg/dL (ref 0.44–1.00)
GFR, Estimated: >60 mL/min (ref >60)
Glucose, Bld: 117 mg/dL — ABNORMAL HIGH (ref 70–99)
Potassium: 3.3 mmol/L — ABNORMAL LOW (ref 3.5–5.1)
Sodium: 135 mmol/L (ref 135–145)
Total Bilirubin: 0.6 mg/dL (ref 0.3–1.2)
Total Protein: 5.5 g/dL — ABNORMAL LOW (ref 6.5–8.1)

## 2022-09-18 MED ORDER — SODIUM CHLORIDE 0.9 % IV SOLN
1200.0000 mg | Freq: Once | INTRAVENOUS | Status: AC
  Administered 2022-09-18: 1200 mg via INTRAVENOUS
  Filled 2022-09-18: qty 20

## 2022-09-18 MED ORDER — SODIUM CHLORIDE 0.9% FLUSH
10.0000 mL | INTRAVENOUS | Status: DC | PRN
  Administered 2022-09-18: 10 mL

## 2022-09-18 MED ORDER — IOHEXOL 300 MG/ML  SOLN
90.0000 mL | Freq: Once | INTRAMUSCULAR | Status: AC | PRN
  Administered 2022-09-18: 90 mL via INTRAVENOUS

## 2022-09-18 MED ORDER — SODIUM CHLORIDE 0.9 % IV SOLN: Freq: Once | INTRAVENOUS | Status: AC

## 2022-09-18 MED ORDER — HEPARIN SOD (PORK) LOCK FLUSH 100 UNIT/ML IV SOLN
INTRAVENOUS | Status: AC
  Administered 2022-09-18: 500 [IU]
  Filled 2022-09-18: qty 5

## 2022-09-18 MED ORDER — LIDOCAINE-PRILOCAINE 2.5-2.5 % EX CREA: 1.0000 | TOPICAL_CREAM | CUTANEOUS | 2 refills | Status: DC | PRN

## 2022-09-18 MED ORDER — HEPARIN SOD (PORK) LOCK FLUSH 100 UNIT/ML IV SOLN: 500.0000 [IU] | Freq: Once | INTRAVENOUS | Status: DC

## 2022-09-18 MED ORDER — IOHEXOL 9 MG/ML PO SOLN
ORAL | Status: AC
  Filled 2022-09-18: qty 1000

## 2022-09-18 MED ORDER — HEPARIN SOD (PORK) LOCK FLUSH 100 UNIT/ML IV SOLN
500.0000 [IU] | Freq: Once | INTRAVENOUS | Status: AC | PRN
  Administered 2022-09-18: 500 [IU]

## 2022-09-18 MED ORDER — POTASSIUM CHLORIDE 20 MEQ/15ML (10%) PO SOLN: 20.0000 meq | Freq: Every day | ORAL | 0 refills | Status: DC

## 2022-09-18 NOTE — Patient Instructions (Signed)
[facility]  Discharge Instructions: Thank you for choosing [facility] to provide your oncology and hematology care.   If you have a lab appointment with the Cancer Center, please go directly to the Cancer Center and check in at the registration area.   Wear comfortable clothing and clothing appropriate for easy access to any Portacath or PICC line.   We strive to give you quality time with your provider. You may need to reschedule your appointment if you arrive late (15 or more minutes).  Arriving late affects you and other patients whose appointments are after yours.  Also, if you miss three or more appointments without notifying the office, you may be dismissed from the clinic at the provider's discretion.      For prescription refill requests, have your pharmacy contact our office and allow 72 hours for refills to be completed.    Today you received the following chemotherapy and/or immunotherapy agents: atezolizumab      To help prevent nausea and vomiting after your treatment, we encourage you to take your nausea medication as directed.  BELOW ARE SYMPTOMS THAT SHOULD BE REPORTED IMMEDIATELY: *FEVER GREATER THAN 100.4 F (38 C) OR HIGHER *CHILLS OR SWEATING *NAUSEA AND VOMITING THAT IS NOT CONTROLLED WITH YOUR NAUSEA MEDICATION *UNUSUAL SHORTNESS OF BREATH *UNUSUAL BRUISING OR BLEEDING *URINARY PROBLEMS (pain or burning when urinating, or frequent urination) *BOWEL PROBLEMS (unusual diarrhea, constipation, pain near the anus) TENDERNESS IN MOUTH AND THROAT WITH OR WITHOUT PRESENCE OF ULCERS (sore throat, sores in mouth, or a toothache) UNUSUAL RASH, SWELLING OR PAIN  UNUSUAL VAGINAL DISCHARGE OR ITCHING   Items with * indicate a potential emergency and should be followed up as soon as possible or go to the Emergency Department if any problems should occur.  Please show the CHEMOTHERAPY ALERT CARD or IMMUNOTHERAPY ALERT CARD at check-in  to the Emergency Department and triage nurse.  Should you have questions after your visit or need to cancel or reschedule your appointment, please contact [facility]  Dept: [phone]  and follow the prompts.  Office hours are 8:00 a.m. to 4:30 p.m. Monday - Friday. Please note that voicemails left after 4:00 p.m. may not be returned until the following business day.  We are closed weekends and major holidays. You have access to a nurse at all times for urgent questions. Please call the main number to the clinic Dept: [phone] and follow the prompts.   For any non-urgent questions, you may also contact your provider using MyChart. We now offer e-Visits for anyone 25 and older to request care online for non-urgent symptoms. For details visit [email or web address].   Also download the MyChart app! Go to the app store, search "MyChart", open the app, select [facility], and log in with your MyChart username and password.

## 2022-09-18 NOTE — Progress Notes (Signed)
Nutrition Follow-up:  Patient with extensive stage small cell lung cancer. She is currently receiving maintenance treatment with single agent Tecentriq 1200 mg IV every 3 weeks.    Met with patient during infusion. She reports ongoing pain in left shoulder for the last month. Pain comes and goes, but reports decreased mobility in her arm. Patient had CT scan this morning which showed arthritis. She reports having a "pretty decent" appetite recently. She has not been drinking Ensure or CIB due to cost. Patient reports they come close to running out of food almost every month. She is appreciative of food assistance received and is requesting additional bags of food as well as case of Ensure.  Medications: percocet  Labs: K 3.3, glucose 117, BUN 5, albumin 3.2  Anthropometrics: Wt 97 lb 6.4 oz today slightly increased  12/27 - 95 lb 6.4 oz 12/7 - 95 lb 3.2 oz  11/9 - 96 lb 6.4 oz    NUTRITION DIAGNOSIS: Unintended weight loss stable   INTERVENTION:  Encouraged high calorie high protein foods to promote wt gain Drink 2 Ensure Plus/equivalent  One complimentary case Ensure Plus HP + 2 bags from [employer] given today    MONITORING, EVALUATION, GOAL: weight trends, intake   NEXT VISIT: Wednesday February 7 during infusion

## 2022-09-20 ENCOUNTER — Other Ambulatory Visit: Payer: Self-pay | Admitting: Physician Assistant

## 2022-09-20 DIAGNOSIS — C3491 Malignant neoplasm of unspecified part of right bronchus or lung: Secondary | ICD-10-CM

## 2022-09-23 ENCOUNTER — Other Ambulatory Visit: Payer: Self-pay

## 2022-10-03 ENCOUNTER — Encounter: Payer: Self-pay | Admitting: Orthopedic Surgery

## 2022-10-03 ENCOUNTER — Ambulatory Visit: Payer: Self-pay

## 2022-10-03 ENCOUNTER — Ambulatory Visit (INDEPENDENT_AMBULATORY_CARE_PROVIDER_SITE_OTHER): Payer: Medicare Other

## 2022-10-03 ENCOUNTER — Ambulatory Visit (INDEPENDENT_AMBULATORY_CARE_PROVIDER_SITE_OTHER): Payer: Medicare Other | Admitting: Orthopedic Surgery

## 2022-10-03 DIAGNOSIS — M79602 Pain in left arm: Secondary | ICD-10-CM | POA: Diagnosis not present

## 2022-10-03 NOTE — Progress Notes (Signed)
Office Visit Note   Patient: Debra Kidd           Date of Birth: [birth year]           MRN: [id number] Visit Date: 10/03/2022 Requested by: Gracelyn Nurse, PA-C [address] [place],  [place] [address] PCP: Pcp, No  Subjective: Chief Complaint  Patient presents with   Left Shoulder - Pain    HPI: Debra Kidd is a 62 y.o. female who presents to the office reporting left shoulder pain.  Been going on for a month.  Denies any history of injury.  The pain does wake her from sleep at night and is relatively constant.  She does state that she has good and bad days.  Describes decreased range of motion.  The pain does radiate down to the elbow.  Uses her right hand to move her left arm.  Describes left upper extremity weakness along with neck pain but no numbness and tingling.  Does have a history of stroke.  She also has lung cancer and is currently been in treatment for 7 years.  Uses tramadol for her back.  Symptoms are worse overhead.  CT scan of the humerus has been performed which shows no evidence of acute fracture or dislocation and mild generalized muscle atrophy..                ROS: All systems reviewed are negative as they relate to the chief complaint within the history of present illness.  Patient denies fevers or chills.  Assessment & Plan: Visit Diagnoses:  1. Left arm pain     Plan: Impression is left shoulder pain which could be radicular or could be from the shoulder itself.  CT scan of the humerus unremarkable particularly for any acute stress fracture or any type of bony destructive lesion.  Plan at this time is MRI scan of the shoulder itself and we may need to consider imaging of the neck as well.  With her history of lung cancer metastasis is in the differential diagnosis but at this point no obvious bony lesions are present in the humerus itself.  Follow-up after that study.  Follow-Up Instructions: No follow-ups on file.   Orders:  Orders  Placed This Encounter  Procedures   XR Cervical Spine 2 or 3 views   No orders of the defined types were placed in this encounter.     Procedures: No procedures performed   Clinical Data: No additional findings.  Objective: Vital Signs: There were no vitals taken for this visit.  Physical Exam:  Constitutional: Patient appears well-developed HEENT:  Head: Normocephalic Eyes:EOM are normal Neck: Normal range of motion Cardiovascular: Normal rate Pulmonary/chest: Effort normal Neurologic: Patient is alert Skin: Skin is warm Psychiatric: Patient has normal mood and affect  Ortho Exam: Ortho exam demonstrates mildly diminished cervical spine range of motion.  No definite paresthesias C5-T1.  Radial pulse intact bilaterally.  Patient has generalized tenderness to palpation around the shoulder girdle including the proximal humerus clavicle and scapula.  No definite coarseness or grinding with internal/external rotation of that left arm.  Generalized muscle atrophy is present symmetrically bilaterally.  Specialty Comments:  No specialty comments available.  Imaging: No results found.   PMFS History: Patient Active Problem List   Diagnosis Date Noted   Ingrown toenail of left foot 09/23/2019   Decreased appetite 04/08/2019   Carotid stenosis 03/02/2019   Acute ischemic stroke ([place]) 01/29/2019   Left-sided weakness 01/29/2019  COVID-19 virus infection 01/29/2019   Encounter for antineoplastic immunotherapy 10/06/2018   Protein calorie malnutrition ([place]) 10/06/2018   Hypercalcemia due to severe hypomagnesemia 08/25/2018   Hypomagnesemia- Cisplastin Induced 08/25/2018   Hypokalemia due to persistent hypomagnesemia 08/25/2018   Thoracic compression fracture, closed, initial encounter-T5, T6 and T8 Acute 08/25/2018   Intractable back pain 08/24/2018   Antineoplastic chemotherapy induced pancytopenia (CODE) ([place]) 07/27/2018   Goals of care, counseling/discussion 06/11/2018    Peripheral edema 10/10/2016   Depression 08/07/2016   Anxiety 08/07/2016   Hypertension 08/07/2016   Antineoplastic chemotherapy induced anemia 08/07/2016   Encounter for antineoplastic chemotherapy 07/17/2016   Hypersensitivity reaction 06/28/2016   Small cell carcinoma of right lung ([place]) 05/30/2016   Mediastinal mass 05/13/2016   Past Medical History:  Diagnosis Date   Basal cell carcinoma of cheek    L side of face   Blood type, Rh positive    Cancer ([facility])    Chronic pain syndrome    Depression 08/07/2016   Encounter for antineoplastic chemotherapy 07/17/2016   History of external beam radiation therapy 11/19/16-12/02/16   brain 25 Gy in 10 fractions   Hyperlipidemia    Hypertension    Hypertension 08/07/2016   Osteoporosis    Small cell lung cancer ([place]) dx'd 05/2016    Family History  Problem Relation Age of Onset   Heart disease Mother    Heart disease Father    Emphysema Brother    Breast cancer Maternal Grandmother    Heart disease Brother     Past Surgical History:  Procedure Laterality Date   ABDOMINAL HYSTERECTOMY  1981   APPENDECTOMY     at age 42   EYE SURGERY     left   IR GENERIC HISTORICAL  06/03/2016   IR FLUORO GUIDE PORT INSERTION RIGHT 06/03/2016 [facility]   IR GENERIC HISTORICAL  06/03/2016   IR US GUIDE VASC ACCESS RIGHT 06/03/2016 [facility]   SKIN CANCER EXCISION     basal cell carcinoma L side of face   Social History   Occupational History   Not on file  Tobacco Use   Smoking status: Every Day    Packs/day: 2.00    Years: 43.00    Total pack years: 86.00    Types: Cigarettes   Smokeless tobacco: Never  Vaping Use   Vaping Use: Never used  Substance and Sexual Activity   Alcohol use: Not Currently    Comment: quit drinking beer 02/2016   Drug use: Yes    Frequency: 7.0 times per week    Types: Marijuana   Sexual activity: Not on file

## 2022-10-06 ENCOUNTER — Encounter: Payer: Self-pay | Admitting: Orthopedic Surgery

## 2022-10-07 ENCOUNTER — Telehealth: Payer: Self-pay

## 2022-10-07 NOTE — Addendum Note (Signed)
Addended byLaurann Montana on: 10/07/2022 10:39 AM   Modules accepted: Orders

## 2022-10-07 NOTE — Telephone Encounter (Signed)
-----   Message from Meredith Pel, MD sent at 10/06/2022  7:07 AM EST ----- Hi Debra Kidd can you order MRI left shoulder nonarthrogram to evaluate possible occult metastatic disease.  Thanks I do not think it needs to be with IV contrast in this case.

## 2022-10-07 NOTE — Progress Notes (Unsigned)
[facility] OFFICE PROGRESS NOTE  Pcp, [name], N3, [name], large right anterior mediastinal and supraclavicular lymphadenopathy as well as pancreatic and splenic metastasis diagnosed in September 2017.  The patient had disease progression in October 2019   PRIOR THERAPY:  1) Systemic chemotherapy was carboplatin for AUC of 5 on day 1 and etoposide 100 MG/M2 on days 1, 2 and 3 with Neulasta support. Status post 6 cycles with significant response of her disease. 2) Prophylactic cranial irradiation under the care of Dr. Sondra Come on 12/02/2016. 3) stereotactic radiotherapy to the recurrent right upper lobe pulmonary nodule under the care of Dr. Sondra Come completed November 10, 2017. 4) Retreatment with systemic chemotherapy with carboplatin for AUC of 5 on day 1 and etoposide 100 mg/M2 on days 1, 2 and 3 as well as Tecentriq (Atezolizumab) 1200 mg IV every 3 weeks with Neulasta support.  First dose June 22, 2018 for disease recurrence.  Status post 5 cycles.  Starting from cycle #2 her dose of carboplatin will be reduced to AUC of 4 and etoposide 80 mg/M2 on days 1, 2 and 3 in addition to the regular dose of Tecentriq.  CURRENT THERAPY: Maintenance treatment with single agent Tecentriq 1200 mg IV every 3 weeks.  Status post 69 cycles.    INTERVAL HISTORY: Debra Kidd 62 y.o. female returns to clinic today for follow-up visit.  The patient is currently being followed for metastatic small cell lung cancer for several years for which she has been on maintenance Tecentriq IV every 3 weeks.  She does tolerate this well.  I last saw the patient in clinic 3 weeks ago with her last cycle of treatment.  At that time she had been having some ongoing left arm/shoulder pain.  She had a CT scan which was negative for any acute findings or metastatic disease.  She has pain particularly at  nighttime and has decreased range of motion.  She was referred to orthopedics who recommended a MRI which is in the process of being scheduled.  Otherwise there are no other changes since last being seen.  Patient reports baseline persistent fatigue and generalized weakness.  She is followed closely by social work and nutrition.  The patient is not very active and does not eat or drink a well-balanced diet.  She often receives complementary food items and supplemental protein drinks in our clinic to take home.  She is scheduled to see the nutritionist while in the infusion room today.   Otherwise she denies any new concerns today.  The patient is currently undergoing treatment with Tecentriq. She tolerates this well except for dry skin and itching. She uses hydrocortisone cream. She denies any fever, chills, or night sweats.   She denies any nausea, vomiting, or constipation.  She sometimes has intermittent diarrhea but denies any recently. She has frequent hypokalemia on labs likely because of this.   She reports her baseline dyspnea on exertion which is stable without any chest pain or hemoptysis.  She reports a baseline mild cough and she continues to smoke approximately 2 packs of cigarettes per day.  She is here today for evaluation and repeat blood work before undergoing cycle #70.   MEDICAL HISTORY: Past Medical History:  Diagnosis Date   Basal cell carcinoma of cheek    L side of face   Blood type, Rh positive    Cancer (  [place])    Chronic pain syndrome    Depression 08/07/2016   Encounter for antineoplastic chemotherapy 07/17/2016   History of external beam radiation therapy 11/19/16-12/02/16   brain 25 Gy in 10 fractions   Hyperlipidemia    Hypertension    Hypertension 08/07/2016   Osteoporosis    Small cell lung cancer ([place]) dx'd 05/2016    ALLERGIES:  is allergic to codeine, motrin [ibuprofen], thiazide-type diuretics, and vicodin [hydrocodone-acetaminophen].  MEDICATIONS:  Current  Outpatient Medications  Medication Sig Dispense Refill   atorvastatin (LIPITOR) 80 MG tablet Take 1 tablet (80 mg total) by mouth daily. 30 tablet 11   clopidogrel (PLAVIX) 75 MG tablet Take 1 tablet (75 mg total) by mouth daily. 90 tablet 3   diazepam (VALIUM) 5 MG tablet Take 5 mg by mouth 3 (three) times daily as needed for anxiety.   2   lidocaine-prilocaine (EMLA) cream Apply 1 Application topically as needed. 30 g 2   oxyCODONE-acetaminophen (PERCOCET/ROXICET) 5-325 MG tablet TAKE ONE TABLET BY MOUTH EVERY 6 HOURS AS NEEDED FOR SEVERE PAIN 15 tablet 0   potassium chloride 20 MEQ/15ML (10%) SOLN Take 15 mLs (20 mEq total) by mouth daily. 80 mL 0   Vitamin D, Ergocalciferol, [name],000 Units by mouth 3 (three) times a week.     No current facility-administered medications for this visit.    SURGICAL HISTORY:  Past Surgical History:  Procedure Laterality Date   ABDOMINAL HYSTERECTOMY  1981   APPENDECTOMY     at age 62   EYE SURGERY     left   IR GENERIC HISTORICAL  06/03/2016   IR FLUORO GUIDE PORT INSERTION RIGHT 06/03/2016 [facility]   IR GENERIC HISTORICAL  06/03/2016   IR US GUIDE VASC ACCESS RIGHT 06/03/2016 [facility]   SKIN CANCER EXCISION     basal cell carcinoma L side of face    REVIEW OF SYSTEMS:   Review of Systems  Constitutional: Negative for appetite change, chills, fatigue, [name], nosebleeds, sore throat and trouble swallowing.   Eyes: Negative for eye problems and icterus.  Respiratory: Negative for cough, hemoptysis, [name], constipation, diarrhea, nausea and vomiting.  Genitourinary: Negative for bladder incontinence, difficulty urinating, dysuria, frequency and hematuria.   Musculoskeletal: Negative for back pain,  gait problem, neck pain and neck stiffness.  Skin: Negative for itching and rash.  Neurological: Negative for dizziness, extremity weakness, gait problem, headaches, [name], depression and sleep disturbance. The patient is not nervous/anxious.     PHYSICAL EXAMINATION:  There were no vitals taken for this visit.  ECOG PERFORMANCE STATUS: {CHL ONC ECOG [id number]  Physical Exam  Constitutional: Oriented to person, place, and time and well-developed, well-nourished, and in no distress. No distress.  HENT:  Head: Normocephalic and atraumatic.  Mouth/Throat: Oropharynx is clear and moist. No oropharyngeal exudate.  Eyes: Conjunctivae are normal. Right eye exhibits no discharge. Left eye exhibits no discharge. No scleral icterus.  Neck: Normal range of motion. Neck supple.  Cardiovascular: Normal rate, regular rhythm, normal heart sounds and intact distal pulses.   Pulmonary/Chest: Effort normal and breath sounds normal. No respiratory distress. No wheezes. No rales.  Abdominal: Soft. Bowel sounds are  normal. Exhibits no distension and no mass. There is no tenderness.  Musculoskeletal: Normal range of motion. Exhibits no edema.  Lymphadenopathy:    No cervical adenopathy.  Neurological: Alert and oriented to person, place, and time. Exhibits normal muscle tone. Gait normal. Coordination normal.  Skin: Skin is warm and dry. No rash noted. Not diaphoretic. No erythema. No pallor.  Psychiatric: Mood, memory and judgment normal.  Vitals reviewed.  LABORATORY DATA: Lab Results  Component Value Date   WBC 6.2 09/18/2022   HGB 11.1 (L) 09/18/2022   HCT 31.5 (L) 09/18/2022   MCV 107.5 (H) 09/18/2022   PLT 221 09/18/2022      Chemistry      Component Value Date/Time   NA 135 09/18/2022 1226   NA 141 08/01/2017 0835   K 3.3 (L) 09/18/2022  1226   K 3.5 08/01/2017 0835   CL 102 09/18/2022 1226   CO2 29 09/18/2022 1226   CO2 25 08/01/2017 0835   BUN 5 (L) 09/18/2022 1226   BUN 8.0 08/01/2017 0835   CREATININE 0.60 09/18/2022 1226   CREATININE 0.9 08/01/2017 0835      Component Value Date/Time   CALCIUM 8.4 (L) 09/18/2022 1226   CALCIUM 9.9 08/01/2017 0835   ALKPHOS 110 09/18/2022 1226   ALKPHOS 142 08/01/2017 0835   AST 17 09/18/2022 1226   AST 15 08/01/2017 0835   ALT 9 09/18/2022 1226   ALT 19 08/01/2017 0835   BILITOT 0.6 09/18/2022 1226   BILITOT 0.35 08/01/2017 0835       RADIOGRAPHIC STUDIES:  XR Cervical Spine 2 or 3 views  Result Date: 10/06/2022 AP lateral radiographs cervical spine reviewed.  Hyperlordosis of the cervical spine combined with some kyphosis of the upper thoracic spine is noted.  No acute fracture or spondylolisthesis.  Bones appear osteopenic.  CT Chest W Contrast  Result Date: 09/18/2022 CLINICAL DATA:  Small-cell lung cancer. Restaging. * Tracking Code: BO * prior appendectomy and hysterectomy. EXAM: CT CHEST, ABDOMEN, AND PELVIS WITH CONTRAST TECHNIQUE: Multidetector CT imaging of the chest, abdomen and pelvis was performed following the standard protocol during bolus administration of intravenous contrast. RADIATION DOSE REDUCTION: This exam was performed according to the departmental dose-optimization program which includes automated exposure control, adjustment of the mA and/or kV according to patient size and/or use of iterative reconstruction technique. CONTRAST:  58mL OMNIPAQUE IOHEXOL 300 MG/ML  SOLN COMPARISON:  04/21/2022 FINDINGS: CT CHEST FINDINGS Cardiovascular: The heart size is normal. No substantial pericardial effusion. Coronary artery calcification is evident. Moderate atherosclerotic calcification is noted in the wall of the thoracic aorta. 9 mm ulcerated plaque/penetrating ulcer along the lateral transverse aorta is stable in the interval. Right Port-A-Cath tip is positioned  at the SVC/RA junction. Mediastinum/Nodes: No mediastinal lymphadenopathy. There is no hilar lymphadenopathy. The esophagus has normal imaging features. There is no axillary lymphadenopathy. Lungs/Pleura: Stable pleuroparenchymal scarring in the anterior right upper lobe compatible with sequelae of radiation therapy. Stable curvilinear atelectasis or scarring in the posterior right lower lobe. No new suspicious pulmonary nodule or mass. No focal airspace consolidation. No pleural effusion. Musculoskeletal: No worrisome lytic or sclerotic osseous abnormality. CT ABDOMEN PELVIS FINDINGS Hepatobiliary: No suspicious focal abnormality within the liver parenchyma. There is no evidence for gallstones, gallbladder wall thickening, or pericholecystic fluid. No intrahepatic or extrahepatic biliary dilation. Pancreas: No focal mass lesion. No dilatation of the main duct. No intraparenchymal cyst. No peripancreatic edema. Spleen: No splenomegaly. No focal mass lesion. Adrenals/Urinary Tract: No adrenal nodule  or mass. Kidneys unremarkable. No evidence for hydroureter. The urinary bladder appears normal for the degree of distention. Stomach/Bowel: Stomach is unremarkable. No gastric wall thickening. No evidence of outlet obstruction. Duodenum is normally positioned as is the ligament of Treitz. No small bowel wall thickening. No small bowel dilatation. The terminal ileum is normal. Nonvisualization of the appendix is consistent with the reported history of appendectomy. No gross colonic mass. No colonic wall thickening. Vascular/Lymphatic: There is moderate atherosclerotic calcification of the abdominal aorta without aneurysm. There is no gastrohepatic or hepatoduodenal ligament lymphadenopathy. No retroperitoneal or mesenteric lymphadenopathy. No pelvic sidewall lymphadenopathy. Reproductive: Hysterectomy.  There is no adnexal mass. Other: No intraperitoneal free fluid. Musculoskeletal: No worrisome lytic or sclerotic osseous  abnormality. Stable superior endplate compression deformity at L2. IMPRESSION: 1. Stable exam. No new or progressive findings to suggest recurrent or metastatic disease in the chest, abdomen, or pelvis. 2. Stable pleuroparenchymal scarring in the anterior right upper lobe compatible with sequelae of radiation therapy. 3. Stable 9 mm ulcerated plaque/penetrating ulcer along the lateral transverse aorta. Continued attention on follow-up recommended. 4.  Aortic Atherosclerosis (ICD10-I70.0). Electronically Signed   By: Misty Stanley M.D.   On: 09/18/2022 11:57   CT Abdomen Pelvis W Contrast  Result Date: 09/18/2022 CLINICAL DATA:  Small-cell lung cancer. Restaging. * Tracking Code: BO * prior appendectomy and hysterectomy. EXAM: CT CHEST, ABDOMEN, AND PELVIS WITH CONTRAST TECHNIQUE: Multidetector CT imaging of the chest, abdomen and pelvis was performed following the standard protocol during bolus administration of intravenous contrast. RADIATION DOSE REDUCTION: This exam was performed according to the departmental dose-optimization program which includes automated exposure control, adjustment of the mA and/or kV according to patient size and/or use of iterative reconstruction technique. CONTRAST:  35mL OMNIPAQUE IOHEXOL 300 MG/ML  SOLN COMPARISON:  04/21/2022 FINDINGS: CT CHEST FINDINGS Cardiovascular: The heart size is normal. No substantial pericardial effusion. Coronary artery calcification is evident. Moderate atherosclerotic calcification is noted in the wall of the thoracic aorta. 9 mm ulcerated plaque/penetrating ulcer along the lateral transverse aorta is stable in the interval. Right Port-A-Cath tip is positioned at the SVC/RA junction. Mediastinum/Nodes: No mediastinal lymphadenopathy. There is no hilar lymphadenopathy. The esophagus has normal imaging features. There is no axillary lymphadenopathy. Lungs/Pleura: Stable pleuroparenchymal scarring in the anterior right upper lobe compatible with sequelae  of radiation therapy. Stable curvilinear atelectasis or scarring in the posterior right lower lobe. No new suspicious pulmonary nodule or mass. No focal airspace consolidation. No pleural effusion. Musculoskeletal: No worrisome lytic or sclerotic osseous abnormality. CT ABDOMEN PELVIS FINDINGS Hepatobiliary: No suspicious focal abnormality within the liver parenchyma. There is no evidence for gallstones, gallbladder wall thickening, or pericholecystic fluid. No intrahepatic or extrahepatic biliary dilation. Pancreas: No focal mass lesion. No dilatation of the main duct. No intraparenchymal cyst. No peripancreatic edema. Spleen: No splenomegaly. No focal mass lesion. Adrenals/Urinary Tract: No adrenal nodule or mass. Kidneys unremarkable. No evidence for hydroureter. The urinary bladder appears normal for the degree of distention. Stomach/Bowel: Stomach is unremarkable. No gastric wall thickening. No evidence of outlet obstruction. Duodenum is normally positioned as is the ligament of Treitz. No small bowel wall thickening. No small bowel dilatation. The terminal ileum is normal. Nonvisualization of the appendix is consistent with the reported history of appendectomy. No gross colonic mass. No colonic wall thickening. Vascular/Lymphatic: There is moderate atherosclerotic calcification of the abdominal aorta without aneurysm. There is no gastrohepatic or hepatoduodenal ligament lymphadenopathy. No retroperitoneal or mesenteric lymphadenopathy. No pelvic sidewall lymphadenopathy. Reproductive: Hysterectomy.  There is no adnexal mass. Other: No intraperitoneal free fluid. Musculoskeletal: No worrisome lytic or sclerotic osseous abnormality. Stable superior endplate compression deformity at L2. IMPRESSION: 1. Stable exam. No new or progressive findings to suggest recurrent or metastatic disease in the chest, abdomen, or pelvis. 2. Stable pleuroparenchymal scarring in the anterior right upper lobe compatible with sequelae  of radiation therapy. 3. Stable 9 mm ulcerated plaque/penetrating ulcer along the lateral transverse aorta. Continued attention on follow-up recommended. 4.  Aortic Atherosclerosis (ICD10-I70.0). Electronically Signed   By: Misty Stanley M.D.   On: 09/18/2022 11:57   CT Humerus Left Wo Contrast  Result Date: 09/18/2022 CLINICAL DATA:  Upper arm pain, stress fracture suspected. No prior imaging EXAM: CT OF THE UPPER LEFT EXTREMITY WITHOUT CONTRAST TECHNIQUE: Multidetector CT imaging of the upper left extremity was performed according to the standard protocol. RADIATION DOSE REDUCTION: This exam was performed according to the departmental dose-optimization program which includes automated exposure control, adjustment of the mA and/or kV according to patient size and/or use of iterative reconstruction technique. COMPARISON:  None Available. FINDINGS: Bones/Joint/Cartilage No evidence of acute fracture or dislocation. Mild glenohumeral joint space narrowing. Mild arthritic changes of the acromioclavicular joint. The elbow joint appear intact. Ligaments Suboptimally assessed by CT. Muscles and Tendons Mild generalized muscle atrophy. No intramuscular hematoma or fluid collection Soft tissues Skin and subcutaneous soft tissues are within normal limits. Partially imaged lung is clear. IMPRESSION: 1. No evidence of acute fracture or dislocation. 2. Mild arthritic changes of the glenohumeral and acromioclavicular joints. 3. Mild generalized muscle atrophy. Electronically Signed   By: Keane Police D.O.   On: 09/18/2022 11:53     ASSESSMENT/PLAN:  This is a very pleasant 61 year old Caucasian female with extensive stage small cell lung cancer.  She presented with a right upper lobe lung mass, large right anterior mediastinal and supraclavicular lymphadenopathy as well as a pancreatic and splenic metastasis.  She was diagnosed in September 2017.    The patient underwent systemic chemotherapy with carboplatin and  etoposide.  She is status post 6 cycles.  She tolerated treatment well except for chemotherapy-induced anemia which required  pRBCs and platelet transfusions.  She had a significant improvement of her disease with chemotherapy. The patient then underwent prophylactic cranial irradiation. She then underwent stereotactic radiotherapy to the right upper lobe and pulmonary nodule.   She had been on observation for 2 years before showing evidence of disease progression.    She then was started on systemic chemotherapy with carboplatin, etoposide, and Tecentriq. Starting from cycle #5, she has been on maintenance single agent Tecentriq.  She has been tolerating treatment well. She is status post 69  cycles of Tecentriq total.  Labs were reviewed.  Recommend that she proceed with cycle #70 today's schedule.  We will see her back for follow-up visit in 3 weeks for evaluation repeat blood work before undergoing cycle #71.  Blood pressure and IV fluids?  She will see member the nutritionist team on the infusion room today.  Oxycodone?  Refer her to pain clinic?  Potassium  The patient was advised to call immediately if she has any concerning symptoms in the interval. The patient voices understanding of current disease status and treatment options and is in agreement with the current care plan. All questions were answered. The patient knows to call the clinic with any problems, questions or concerns. We can certainly see the patient much sooner if necessary       No orders of  the defined types were placed in this encounter.    I spent {CHL ONC TIME VISIT - KPTWS:[id number]} counseling the patient face to face. The total time spent in the appointment was {CHL ONC TIME VISIT - YFVCB:[id number]}.  Calliope Delangel L Tiny Chaudhary, PA-C 10/07/22

## 2022-10-07 NOTE — Telephone Encounter (Signed)
Referral placed.

## 2022-10-09 ENCOUNTER — Inpatient Hospital Stay: Payer: Medicare Other

## 2022-10-09 ENCOUNTER — Inpatient Hospital Stay: Payer: Medicare Other | Admitting: Dietician

## 2022-10-09 ENCOUNTER — Inpatient Hospital Stay: Payer: Medicare Other | Attending: Internal Medicine | Admitting: Physician Assistant

## 2022-10-09 ENCOUNTER — Other Ambulatory Visit: Payer: Self-pay

## 2022-10-09 VITALS — BP 102/78 | HR 78 | Temp 97.6°F | Resp 15 | Ht 63.0 in | Wt 96.7 lb

## 2022-10-09 VITALS — BP 105/66 | HR 77 | Resp 16

## 2022-10-09 DIAGNOSIS — C3491 Malignant neoplasm of unspecified part of right bronchus or lung: Secondary | ICD-10-CM

## 2022-10-09 DIAGNOSIS — Z79899 Other long term (current) drug therapy: Secondary | ICD-10-CM | POA: Insufficient documentation

## 2022-10-09 DIAGNOSIS — C3411 Malignant neoplasm of upper lobe, right bronchus or lung: Secondary | ICD-10-CM | POA: Diagnosis not present

## 2022-10-09 DIAGNOSIS — I10 Essential (primary) hypertension: Secondary | ICD-10-CM | POA: Insufficient documentation

## 2022-10-09 DIAGNOSIS — Z9221 Personal history of antineoplastic chemotherapy: Secondary | ICD-10-CM | POA: Diagnosis not present

## 2022-10-09 DIAGNOSIS — G894 Chronic pain syndrome: Secondary | ICD-10-CM | POA: Diagnosis not present

## 2022-10-09 DIAGNOSIS — D6481 Anemia due to antineoplastic chemotherapy: Secondary | ICD-10-CM | POA: Insufficient documentation

## 2022-10-09 DIAGNOSIS — E876 Hypokalemia: Secondary | ICD-10-CM | POA: Insufficient documentation

## 2022-10-09 DIAGNOSIS — M6250 Muscle wasting and atrophy, not elsewhere classified, unspecified site: Secondary | ICD-10-CM | POA: Diagnosis not present

## 2022-10-09 DIAGNOSIS — Z7902 Long term (current) use of antithrombotics/antiplatelets: Secondary | ICD-10-CM | POA: Insufficient documentation

## 2022-10-09 DIAGNOSIS — C7889 Secondary malignant neoplasm of other digestive organs: Secondary | ICD-10-CM | POA: Diagnosis not present

## 2022-10-09 DIAGNOSIS — Z85828 Personal history of other malignant neoplasm of skin: Secondary | ICD-10-CM | POA: Diagnosis not present

## 2022-10-09 DIAGNOSIS — Z5112 Encounter for antineoplastic immunotherapy: Secondary | ICD-10-CM

## 2022-10-09 DIAGNOSIS — F17210 Nicotine dependence, cigarettes, uncomplicated: Secondary | ICD-10-CM | POA: Insufficient documentation

## 2022-10-09 DIAGNOSIS — C7800 Secondary malignant neoplasm of unspecified lung: Secondary | ICD-10-CM | POA: Insufficient documentation

## 2022-10-09 DIAGNOSIS — M8008XA Age-related osteoporosis with current pathological fracture, vertebra(e), initial encounter for fracture: Secondary | ICD-10-CM | POA: Diagnosis not present

## 2022-10-09 DIAGNOSIS — R0609 Other forms of dyspnea: Secondary | ICD-10-CM | POA: Diagnosis not present

## 2022-10-09 DIAGNOSIS — M25519 Pain in unspecified shoulder: Secondary | ICD-10-CM | POA: Diagnosis not present

## 2022-10-09 DIAGNOSIS — R0981 Nasal congestion: Secondary | ICD-10-CM | POA: Diagnosis not present

## 2022-10-09 DIAGNOSIS — E785 Hyperlipidemia, unspecified: Secondary | ICD-10-CM | POA: Diagnosis not present

## 2022-10-09 LAB — CMP (CANCER CENTER ONLY)
ALT: 10 U/L (ref 0–44)
AST: 18 U/L (ref 15–41)
Albumin: 3.4 g/dL — ABNORMAL LOW (ref 3.5–5.0)
Alkaline Phosphatase: 101 U/L (ref 38–126)
Anion gap: 4 — ABNORMAL LOW (ref 5–15)
BUN: 5 mg/dL — ABNORMAL LOW (ref 8–23)
CO2: 30 mmol/L (ref 22–32)
Calcium: 8.7 mg/dL — ABNORMAL LOW (ref 8.9–10.3)
Chloride: 104 mmol/L (ref 98–111)
Creatinine: 0.63 mg/dL (ref 0.44–1.00)
GFR, Estimated: >60 mL/min (ref >60)
Glucose, Bld: 95 mg/dL (ref 70–99)
Potassium: 3.5 mmol/L (ref 3.5–5.1)
Sodium: 138 mmol/L (ref 135–145)
Total Bilirubin: 0.5 mg/dL (ref 0.3–1.2)
Total Protein: 5.7 g/dL — ABNORMAL LOW (ref 6.5–8.1)

## 2022-10-09 LAB — CBC WITH DIFFERENTIAL (CANCER CENTER ONLY)
Abs Immature Granulocytes: 0.01 10*3/uL (ref 0.00–0.07)
Basophils Absolute: 0 10*3/uL (ref 0.0–0.1)
Basophils Relative: 1 %
Eosinophils Absolute: 0.2 10*3/uL (ref 0.0–0.5)
Eosinophils Relative: 3 %
HCT: 33.5 % — ABNORMAL LOW (ref 36.0–46.0)
Hemoglobin: 11.8 g/dL — ABNORMAL LOW (ref 12.0–15.0)
Immature Granulocytes: 0 %
Lymphocytes Relative: 34 %
Lymphs Abs: 1.8 10*3/uL (ref 0.7–4.0)
MCH: 38.8 pg — ABNORMAL HIGH (ref 26.0–34.0)
MCHC: 35.2 g/dL (ref 30.0–36.0)
MCV: 110.2 fL — ABNORMAL HIGH (ref 80.0–100.0)
Monocytes Absolute: 0.3 10*3/uL (ref 0.1–1.0)
Monocytes Relative: 6 %
Neutro Abs: 2.9 10*3/uL (ref 1.7–7.7)
Neutrophils Relative %: 56 %
Platelet Count: 236 10*3/uL (ref 150–400)
RBC: 3.04 MIL/uL — ABNORMAL LOW (ref 3.87–5.11)
RDW: 13.7 % (ref 11.5–15.5)
WBC Count: 5.1 10*3/uL (ref 4.0–10.5)
nRBC: 0 % (ref 0.0–0.2)

## 2022-10-09 MED ORDER — SODIUM CHLORIDE 0.9 % IV SOLN: Freq: Once | INTRAVENOUS | Status: AC

## 2022-10-09 MED ORDER — SODIUM CHLORIDE 0.9 % IV SOLN
1200.0000 mg | Freq: Once | INTRAVENOUS | Status: AC
  Administered 2022-10-09: 1200 mg via INTRAVENOUS
  Filled 2022-10-09: qty 20

## 2022-10-09 MED ORDER — SODIUM CHLORIDE 0.9% FLUSH
10.0000 mL | INTRAVENOUS | Status: DC | PRN
  Administered 2022-10-09: 10 mL

## 2022-10-09 MED ORDER — HEPARIN SOD (PORK) LOCK FLUSH 100 UNIT/ML IV SOLN
500.0000 [IU] | Freq: Once | INTRAVENOUS | Status: AC | PRN
  Administered 2022-10-09: 500 [IU]

## 2022-10-09 NOTE — Patient Instructions (Signed)
[facility]  Discharge Instructions: Thank you for choosing [facility] to provide your oncology and hematology care.   If you have a lab appointment with the [facility], please go directly to the [facility] and check in at the registration area.   Wear comfortable clothing and clothing appropriate for easy access to any Portacath or PICC line.   We strive to give you quality time with your provider. You may need to reschedule your appointment if you arrive late (15 or more minutes).  Arriving late affects you and other patients whose appointments are after yours.  Also, if you miss three or more appointments without notifying the office, you may be dismissed from the clinic at the provider's discretion.      For prescription refill requests, have your pharmacy contact our office and allow 72 hours for refills to be completed.    Today you received the following chemotherapy and/or immunotherapy agents: atezolizumab      To help prevent nausea and vomiting after your treatment, we encourage you to take your nausea medication as directed.  BELOW ARE SYMPTOMS THAT SHOULD BE REPORTED IMMEDIATELY: *FEVER GREATER THAN 100.4 F (38 C) OR HIGHER *CHILLS OR SWEATING *NAUSEA AND VOMITING THAT IS NOT CONTROLLED WITH YOUR NAUSEA MEDICATION *UNUSUAL SHORTNESS OF BREATH *UNUSUAL BRUISING OR BLEEDING *URINARY PROBLEMS (pain or burning when urinating, or frequent urination) *BOWEL PROBLEMS (unusual diarrhea, constipation, pain near the anus) TENDERNESS IN MOUTH AND THROAT WITH OR WITHOUT PRESENCE OF ULCERS (sore throat, sores in mouth, or a toothache) UNUSUAL RASH, SWELLING OR PAIN  UNUSUAL VAGINAL DISCHARGE OR ITCHING   Items with * indicate a potential emergency and should be followed up as soon as possible or go to the Emergency Department if any problems should occur.  Please show the CHEMOTHERAPY ALERT CARD or IMMUNOTHERAPY ALERT CARD at  check-in to the Emergency Department and triage nurse.  Should you have questions after your visit or need to cancel or reschedule your appointment, please contact [facility]  Dept: [phone]  and follow the prompts.  Office hours are 8:00 a.m. to 4:30 p.m. Monday - Friday. Please note that voicemails left after 4:00 p.m. may not be returned until the following business day.  We are closed weekends and major holidays. You have access to a nurse at all times for urgent questions. Please call the main number to the clinic Dept: [phone] and follow the prompts.   For any non-urgent questions, you may also contact your provider using MyChart. We now offer e-Visits for anyone 26 and older to request care online for non-urgent symptoms. For details visit [email or web address].   Also download the MyChart app! Go to the app store, search "MyChart", open the app, select [facility], and log in with your MyChart username and password.

## 2022-10-09 NOTE — Progress Notes (Signed)
Nutrition Follow-up:  Patient with extensive stage small cell lung cancer. She is currently receiving maintenance treatment with single agent Tecentriq 1200 mg IV every 3 weeks.    Met with patient in infusion. She reports doing well today. Patient says her appetite has improved and has been working hard to eat more. Patient typically eats one big meal day. She recalls a variety of foods including steak, salad, chicken dumplings, cheeseburger, subs, home made soups, tuna, mac and cheese. She is drinking 2 Ensure and chocolate milk. Patient expresses her appreciation of food and supplement assistance she has received through out treatment.    Medications: reviewed   Labs: BUN 5, albumin 3.4  Anthropometrics: Wt 96 lb 11.2 oz - stable   1/17 - 97 lb 6.4 oz 12/27 - 95 lb 6.4 oz 12/7 - 95 lb 3.2 oz  11/9 - 96 lb 6.4 oz    NUTRITION DIAGNOSIS: Unintended weight loss stable     INTERVENTION:  Continue eating high calorie high protein foods Continue drinking 2 Ensure Plus/equivalent daily One complimentary case of Ensure Plus HP + bag of food from [employer] provided  Patient will try working to drink water    MONITORING, EVALUATION, GOAL: weight trends, intake    NEXT VISIT: Wednesday March 20 during infusion

## 2022-10-10 ENCOUNTER — Other Ambulatory Visit: Payer: Self-pay

## 2022-10-14 ENCOUNTER — Telehealth: Payer: Self-pay | Admitting: Internal Medicine

## 2022-10-14 NOTE — Telephone Encounter (Signed)
Called patient regarding upcoming February/March appointments, patient is notified.

## 2022-10-20 ENCOUNTER — Other Ambulatory Visit: Payer: Medicare Other

## 2022-10-23 ENCOUNTER — Telehealth: Payer: Self-pay | Admitting: Medical Oncology

## 2022-10-23 ENCOUNTER — Other Ambulatory Visit: Payer: Self-pay | Admitting: Medical Oncology

## 2022-10-23 NOTE — Telephone Encounter (Addendum)
Med Management-" I can't get any help with my meds". She sees her neurologist 03/04 and she will ask for refills for plavix and atorvastatin.   Anxious-"I am about to go nuts, my hands shake all the time".  She is very anxious and cannot relax.  Dr Santiago Bur prescribed her Valium TID # 50 on 06/04/22. ( Pharmacy confirmed).She has been  out of valium for two weeks.  She no longer goes to Dr. Santiago Bur @ [facility]  "because they don't help me. All they do is drug test me and I am always negative".    Pain-Shoulder MRI shoulder 10/27/2022 . I told her to contact Dr Marlou Sa if she needs something for pain or to relax her for the MRI.  PCP-I told her she needs to find a PCP.  I told her that Dr Julien Nordmann may not prescribe her anything for anxiety or pain.

## 2022-10-23 NOTE — Telephone Encounter (Signed)
[name] updated about appts.

## 2022-10-24 ENCOUNTER — Telehealth: Payer: Self-pay | Admitting: Orthopedic Surgery

## 2022-10-24 ENCOUNTER — Other Ambulatory Visit: Payer: Self-pay | Admitting: Orthopedic Surgery

## 2022-10-24 MED ORDER — DIAZEPAM 10 MG PO TABS: 10.0000 mg | ORAL_TABLET | ORAL | 0 refills | Status: DC

## 2022-10-24 NOTE — Telephone Encounter (Signed)
Patient states Dr. Marlou Sa was going to call in an Rx for 1 valium for patient to have MRI Sunday. Please advise..[phone] [name]- her husband..[phone]

## 2022-10-24 NOTE — Telephone Encounter (Signed)
Meds sent thx

## 2022-10-25 ENCOUNTER — Telehealth: Payer: Self-pay | Admitting: Orthopedic Surgery

## 2022-10-25 NOTE — Telephone Encounter (Signed)
Patient asking if she need to put port cream on her port-a- cath prior to her MRI. Please advise

## 2022-10-25 NOTE — Telephone Encounter (Signed)
IC advised.  

## 2022-10-27 ENCOUNTER — Ambulatory Visit
Admission: RE | Admit: 2022-10-27 | Discharge: 2022-10-27 | Disposition: A | Discharge status: Home / Self Care | Payer: Medicare Other | Source: Ambulatory Visit | Attending: Orthopedic Surgery | Admitting: Orthopedic Surgery

## 2022-10-27 DIAGNOSIS — M79602 Pain in left arm: Secondary | ICD-10-CM

## 2022-10-30 ENCOUNTER — Inpatient Hospital Stay: Payer: Medicare Other

## 2022-10-30 ENCOUNTER — Other Ambulatory Visit: Payer: Self-pay | Admitting: Medical Oncology

## 2022-10-30 ENCOUNTER — Other Ambulatory Visit: Payer: Self-pay

## 2022-10-30 ENCOUNTER — Other Ambulatory Visit: Payer: Medicare Other

## 2022-10-30 ENCOUNTER — Inpatient Hospital Stay (HOSPITAL_BASED_OUTPATIENT_CLINIC_OR_DEPARTMENT_OTHER): Payer: Medicare Other | Admitting: Internal Medicine

## 2022-10-30 ENCOUNTER — Other Ambulatory Visit: Payer: Self-pay | Admitting: Physician Assistant

## 2022-10-30 ENCOUNTER — Encounter: Payer: Self-pay | Admitting: Medical Oncology

## 2022-10-30 VITALS — BP 118/82 | HR 85 | Temp 98.1°F | Resp 18 | Wt 94.5 lb

## 2022-10-30 DIAGNOSIS — C3491 Malignant neoplasm of unspecified part of right bronchus or lung: Secondary | ICD-10-CM

## 2022-10-30 DIAGNOSIS — R52 Pain, unspecified: Secondary | ICD-10-CM

## 2022-10-30 DIAGNOSIS — F419 Anxiety disorder, unspecified: Secondary | ICD-10-CM

## 2022-10-30 DIAGNOSIS — Z5112 Encounter for antineoplastic immunotherapy: Secondary | ICD-10-CM | POA: Diagnosis not present

## 2022-10-30 DIAGNOSIS — E876 Hypokalemia: Secondary | ICD-10-CM

## 2022-10-30 LAB — CMP (CANCER CENTER ONLY)
ALT: 10 U/L (ref 0–44)
AST: 16 U/L (ref 15–41)
Albumin: 3.5 g/dL (ref 3.5–5.0)
Alkaline Phosphatase: 116 U/L (ref 38–126)
Anion gap: 6 (ref 5–15)
BUN: <5 mg/dL — ABNORMAL LOW (ref 8–23)
CO2: 30 mmol/L (ref 22–32)
Calcium: 8.2 mg/dL — ABNORMAL LOW (ref 8.9–10.3)
Chloride: 101 mmol/L (ref 98–111)
Creatinine: 0.55 mg/dL (ref 0.44–1.00)
GFR, Estimated: >60 mL/min (ref >60)
Glucose, Bld: 96 mg/dL (ref 70–99)
Potassium: 3.2 mmol/L — ABNORMAL LOW (ref 3.5–5.1)
Sodium: 137 mmol/L (ref 135–145)
Total Bilirubin: 0.7 mg/dL (ref 0.3–1.2)
Total Protein: 5.8 g/dL — ABNORMAL LOW (ref 6.5–8.1)

## 2022-10-30 LAB — CBC WITH DIFFERENTIAL (CANCER CENTER ONLY)
Abs Immature Granulocytes: 0.01 10*3/uL (ref 0.00–0.07)
Basophils Absolute: 0 10*3/uL (ref 0.0–0.1)
Basophils Relative: 0 %
Eosinophils Absolute: 0.1 10*3/uL (ref 0.0–0.5)
Eosinophils Relative: 2 %
HCT: 32.5 % — ABNORMAL LOW (ref 36.0–46.0)
Hemoglobin: 11.8 g/dL — ABNORMAL LOW (ref 12.0–15.0)
Immature Granulocytes: 0 %
Lymphocytes Relative: 30 %
Lymphs Abs: 1.7 10*3/uL (ref 0.7–4.0)
MCH: 38.7 pg — ABNORMAL HIGH (ref 26.0–34.0)
MCHC: 36.3 g/dL — ABNORMAL HIGH (ref 30.0–36.0)
MCV: 106.6 fL — ABNORMAL HIGH (ref 80.0–100.0)
Monocytes Absolute: 0.4 10*3/uL (ref 0.1–1.0)
Monocytes Relative: 7 %
Neutro Abs: 3.5 10*3/uL (ref 1.7–7.7)
Neutrophils Relative %: 61 %
Platelet Count: 213 10*3/uL (ref 150–400)
RBC: 3.05 MIL/uL — ABNORMAL LOW (ref 3.87–5.11)
RDW: 12.5 % (ref 11.5–15.5)
WBC Count: 5.8 10*3/uL (ref 4.0–10.5)
nRBC: 0 % (ref 0.0–0.2)

## 2022-10-30 MED ORDER — SODIUM CHLORIDE 0.9% FLUSH
10.0000 mL | INTRAVENOUS | Status: DC | PRN
  Administered 2022-10-30: 10 mL

## 2022-10-30 MED ORDER — POTASSIUM CHLORIDE 20 MEQ/15ML (10%) PO SOLN: 20.0000 meq | Freq: Every day | ORAL | 0 refills | Status: DC

## 2022-10-30 MED ORDER — SODIUM CHLORIDE 0.9 % IV SOLN: Freq: Once | INTRAVENOUS | Status: AC

## 2022-10-30 MED ORDER — HEPARIN SOD (PORK) LOCK FLUSH 100 UNIT/ML IV SOLN
500.0000 [IU] | Freq: Once | INTRAVENOUS | Status: AC | PRN
  Administered 2022-10-30: 500 [IU]

## 2022-10-30 MED ORDER — SODIUM CHLORIDE 0.9 % IV SOLN
1200.0000 mg | Freq: Once | INTRAVENOUS | Status: AC
  Administered 2022-10-30: 1200 mg via INTRAVENOUS
  Filled 2022-10-30: qty 20

## 2022-10-30 NOTE — Progress Notes (Signed)
[facility] Telephone:([phone]) [phone]   Fax:([phone]) [phone]  OFFICE PROGRESS NOTE  Pcp, [name], N3, [name], large right anterior mediastinal and supraclavicular lymphadenopathy as well as pancreatic and splenic metastasis diagnosed in September 2017.  The patient had disease progression in October 2019.  PRIOR THERAPY:  1) Systemic chemotherapy was carboplatin for AUC of 5 on day 1 and etoposide 100 MG/M2 on days 1, 2 and 3 with Neulasta support. Status post 6 cycles with significant response of her disease. 2) Prophylactic cranial irradiation under the care of Dr. Sondra Come on 12/02/2016. 3) stereotactic radiotherapy to the recurrent right upper lobe pulmonary nodule under the care of Dr. Sondra Come completed November 10, 2017. 4) Retreatment with systemic chemotherapy with carboplatin for AUC of 5 on day 1 and etoposide 100 mg/M2 on days 1, 2 and 3 as well as Tecentriq (Atezolizumab) 1200 mg IV every 3 weeks with Neulasta support.  First dose June 22, 2018 for disease recurrence.  Status post 5 cycles.  Starting from cycle #2 her dose of carboplatin will be reduced to AUC of 4 and etoposide 80 mg/M2 on days 1, 2 and 3 in addition to the regular dose of Tecentriq.  CURRENT THERAPY: Maintenance treatment with single agent Tecentriq 1200 mg IV every 3 weeks.  Status post 65 cycles.  INTERVAL HISTORY: Debra Kidd 62 y.o. female returns to the neck today for follow-up visit.  The patient continues to have a lot of anxiety issues as well as chronic back pain that has been going on for many years.  Her previous primary care physician retired and she has been running out of her medication.  She was seen at the [facility] clinic but was not happy with them for her pain management and frequent drug testing.  She denied having any current chest pain, shortness of breath except with  exertion with no cough or hemoptysis.  She has no nausea, vomiting, diarrhea or constipation.  She has no headache or visual changes.  She had MRI of the left shoulder performed recently and that showed no evidence of metastatic disease.  The patient is here today for evaluation before starting cycle #71 of her treatment.   MEDICAL HISTORY: Past Medical History:  Diagnosis Date   Basal cell carcinoma of cheek    L side of face   Blood type, Rh positive    Cancer (HCC)    Chronic pain syndrome    Depression 08/07/2016   Encounter for antineoplastic chemotherapy 07/17/2016   History of external beam radiation therapy 11/19/16-12/02/16   brain 25 Gy in 10 fractions   Hyperlipidemia    Hypertension    Hypertension 08/07/2016   Osteoporosis    Small cell lung cancer ([place]) dx'd 05/2016    ALLERGIES:  is allergic to codeine, motrin [ibuprofen], thiazide-type diuretics, and vicodin [hydrocodone-acetaminophen].  MEDICATIONS:  Current Outpatient Medications  Medication Sig Dispense Refill   diazepam (VALIUM) 10 MG tablet Take 1 tablet (10 mg total) by mouth See admin instructions. Take 1 30 minutes before MRI scan for anxiety. 6 tablet 0   atorvastatin (LIPITOR) 80 MG tablet Take 1 tablet (80 mg total) by mouth daily. 30 tablet 11   clopidogrel (PLAVIX) 75 MG tablet Take 1 tablet (75 mg total) by mouth daily. 90 tablet 3   diazepam (VALIUM) 5 MG tablet Take 5 mg by mouth  3 (three) times daily as needed for anxiety.   2   lidocaine-prilocaine (EMLA) cream Apply 1 Application topically as needed. 30 g 2   oxyCODONE-acetaminophen (PERCOCET/ROXICET) 5-325 MG tablet TAKE ONE TABLET BY MOUTH EVERY 6 HOURS AS NEEDED FOR SEVERE PAIN 15 tablet 0   potassium chloride 20 MEQ/15ML (10%) SOLN Take 15 mLs (20 mEq total) by mouth daily. 80 mL 0   Vitamin D, Ergocalciferol, [name],000 Units by mouth 3 (three) times a week.     No current facility-administered  medications for this visit.    SURGICAL HISTORY:  Past Surgical History:  Procedure Laterality Date   ABDOMINAL HYSTERECTOMY  1981   APPENDECTOMY     at age 26   EYE SURGERY     left   IR GENERIC HISTORICAL  06/03/2016   IR FLUORO GUIDE PORT INSERTION RIGHT 06/03/2016 [facility]   IR GENERIC HISTORICAL  06/03/2016   IR US GUIDE VASC ACCESS RIGHT 06/03/2016 [facility]   SKIN CANCER EXCISION     basal cell carcinoma L side of face    REVIEW OF SYSTEMS:  A comprehensive review of systems was negative except for: Constitutional: positive for fatigue Musculoskeletal: positive for arthralgias Behavioral/Psych: positive for anxiety   PHYSICAL EXAMINATION: General appearance: alert, cooperative, fatigued, and no distress Head: Normocephalic, without obvious abnormality, atraumatic Neck: no adenopathy, no JVD, supple, symmetrical, trachea midline, and thyroid not enlarged, symmetric, no tenderness/mass/nodules Lymph nodes: Cervical, supraclavicular, and axillary nodes normal. Resp: clear to auscultation bilaterally Back: symmetric, no curvature. ROM normal. No CVA tenderness. Cardio: regular rate and rhythm, S1, S2 normal, no murmur, click, rub or gallop GI: soft, non-tender; bowel sounds normal; no masses,  no organomegaly Extremities: extremities normal, atraumatic, no cyanosis or edema  ECOG PERFORMANCE STATUS: 1 - Symptomatic but completely ambulatory  Blood pressure 118/82, pulse 85, temperature 98.1 F (36.7 C), temperature source Oral, resp. rate 18, weight 94 lb 8 oz (42.9 kg), SpO2 100 %.  LABORATORY DATA: Lab Results  Component Value Date   WBC 5.1 10/09/2022   HGB 11.8 (L) 10/09/2022   HCT 33.5 (L) 10/09/2022   MCV 110.2 (H) 10/09/2022   PLT 236 10/09/2022      Chemistry      Component Value Date/Time   NA 138 10/09/2022 1017   NA 141 08/01/2017 0835   K 3.5 10/09/2022 1017   K 3.5 08/01/2017 0835   CL 104 10/09/2022 1017   CO2 30 10/09/2022 1017   CO2 25  08/01/2017 0835   BUN 5 (L) 10/09/2022 1017   BUN 8.0 08/01/2017 0835   CREATININE 0.63 10/09/2022 1017   CREATININE 0.9 08/01/2017 0835      Component Value Date/Time   CALCIUM 8.7 (L) 10/09/2022 1017   CALCIUM 9.9 08/01/2017 0835   ALKPHOS 101 10/09/2022 1017   ALKPHOS 142 08/01/2017 0835   AST 18 10/09/2022 1017   AST 15 08/01/2017 0835   ALT 10 10/09/2022 1017   ALT 19 08/01/2017 0835   BILITOT 0.5 10/09/2022 1017   BILITOT 0.35 08/01/2017 0835       RADIOGRAPHIC STUDIES: MR Shoulder Left w/o contrast  Result Date: 10/29/2022 CLINICAL DATA:  Possible occult metastatic disease EXAM: MRI OF THE LEFT SHOULDER WITHOUT CONTRAST TECHNIQUE: Multiplanar, multisequence MR imaging of the shoulder was performed. No intravenous contrast was administered. COMPARISON:  CT 09/18/2022 FINDINGS: Rotator cuff: There is thinning of the distal supraspinatus tendon without any edema signal.  The infraspinatus is intact. Teres minor tendon is intact. Subscapularis tendon is intact. Muscles: Generalized loss of muscle bulk of the left shoulder musculature. Biceps Long Head: Intraarticular and extraarticular portions of the biceps tendon are intact. Acromioclavicular Joint: Mild arthropathy of the acromioclavicular joint. No significant subacromial/subdeltoid bursal fluid . Glenohumeral Joint: No joint effusion. Mild chondrosis. Labrum: Degenerative posterior labral blunting. Bones: No evidence of acute fracture. There is no focal bone lesion. Other: No focal fluid collection.  No lymphadenopathy. IMPRESSION: No acute osseous abnormality or focal bone lesion. Thinning of the distal supraspinatus tendon without any edema signal, likely reflecting chronic partial tearing. No high-grade or retracted cuff tear. Generalized loss of muscle bulk of the left shoulder musculature. Mild glenohumeral and AC joint osteoarthritis. Electronically Signed   By: Maurine Simmering M.D.   On: 10/29/2022 12:31   XR Cervical Spine 2 or 3  views  Result Date: 10/06/2022 AP lateral radiographs cervical spine reviewed.  Hyperlordosis of the cervical spine combined with some kyphosis of the upper thoracic spine is noted.  No acute fracture or spondylolisthesis.  Bones appear osteopenic.   ASSESSMENT AND PLAN:  This is a very pleasant 61 years old white female with extensive stage small cell lung cancer status post systemic chemotherapy with carbo platinum and etoposide for 6 cycles and the patient rotated her treatment well except for chemotherapy-induced anemia and requirement for PRBCs and platelet transfusion. She had significant improvement in her disease with the chemotherapy. She also had prophylactic cranial irradiation. She also underwent stereotactic radiotherapy to progressive right upper lobe pulmonary nodule. The patient has been in observation for close to 2 years. Repeat CT scan of the chest, abdomen and pelvis performed recently showed evidence for disease progression in the abdomen. The patient started on systemic chemotherapy again with carboplatin, etoposide and Tecentriq status post 70 cycles.  Starting from cycle #5 the patient is treated with maintenance single agent Tecentriq. The patient has been tolerating this treatment well with no concerning adverse effects. I recommended for her to proceed with cycle #71 today as planned. For the anxiety and pain management, will refer her to the palliative care team. She will also try to have a family doctor close to home for management of her other medical issues. For the history of stroke, she is followed by neurology and she is currently on Plavix and aspirin. The patient will come back for follow-up visit in 3 weeks for evaluation before the next cycle of her treatment. She was advised to call immediately if she has any concerning symptoms in the interval. All questions were answered. The patient knows to call the clinic with any problems, questions or concerns. We can  certainly see the patient much sooner if necessary.  Disclaimer: This note was dictated with voice recognition software. Similar sounding words can inadvertently be transcribed and may not be corrected upon review.

## 2022-10-30 NOTE — Patient Instructions (Signed)
[facility]  Discharge Instructions: Thank you for choosing [facility] to provide your oncology and hematology care.   If you have a lab appointment with the Cancer Center, please go directly to the Cancer Center and check in at the registration area.   Wear comfortable clothing and clothing appropriate for easy access to any Portacath or PICC line.   We strive to give you quality time with your provider. You may need to reschedule your appointment if you arrive late (15 or more minutes).  Arriving late affects you and other patients whose appointments are after yours.  Also, if you miss three or more appointments without notifying the office, you may be dismissed from the clinic at the provider's discretion.      For prescription refill requests, have your pharmacy contact our office and allow 72 hours for refills to be completed.    Today you received the following chemotherapy and/or immunotherapy agents tecentriq      To help prevent nausea and vomiting after your treatment, we encourage you to take your nausea medication as directed.  BELOW ARE SYMPTOMS THAT SHOULD BE REPORTED IMMEDIATELY: *FEVER GREATER THAN 100.4 F (38 C) OR HIGHER *CHILLS OR SWEATING *NAUSEA AND VOMITING THAT IS NOT CONTROLLED WITH YOUR NAUSEA MEDICATION *UNUSUAL SHORTNESS OF BREATH *UNUSUAL BRUISING OR BLEEDING *URINARY PROBLEMS (pain or burning when urinating, or frequent urination) *BOWEL PROBLEMS (unusual diarrhea, constipation, pain near the anus) TENDERNESS IN MOUTH AND THROAT WITH OR WITHOUT PRESENCE OF ULCERS (sore throat, sores in mouth, or a toothache) UNUSUAL RASH, SWELLING OR PAIN  UNUSUAL VAGINAL DISCHARGE OR ITCHING   Items with * indicate a potential emergency and should be followed up as soon as possible or go to the Emergency Department if any problems should occur.  Please show the CHEMOTHERAPY ALERT CARD or IMMUNOTHERAPY ALERT CARD at  check-in to the Emergency Department and triage nurse.  Should you have questions after your visit or need to cancel or reschedule your appointment, please contact [facility]  Dept: [phone]  and follow the prompts.  Office hours are 8:00 a.m. to 4:30 p.m. Monday - Friday. Please note that voicemails left after 4:00 p.m. may not be returned until the following business day.  We are closed weekends and major holidays. You have access to a nurse at all times for urgent questions. Please call the main number to the clinic Dept: [phone] and follow the prompts.   For any non-urgent questions, you may also contact your provider using MyChart. We now offer e-Visits for anyone 18 and older to request care online for non-urgent symptoms. For details visit [email or web address].   Also download the MyChart app! Go to the app store, search "MyChart", open the app, select [facility], and log in with your MyChart username and password.  

## 2022-10-30 NOTE — Progress Notes (Signed)
Patient seen by MD today  Vitals are within treatment parameters.  Labs reviewed: and are within treatment parameters.  Per physician team, patient is ready for treatment and there are NO modifications to the treatment plan.

## 2022-10-30 NOTE — Progress Notes (Signed)
I called the patient to let her know that her potassium was low and I am sending in a prescription to the pharmacy.

## 2022-10-31 ENCOUNTER — Ambulatory Visit: Payer: Medicare Other | Admitting: Orthopedic Surgery

## 2022-11-01 ENCOUNTER — Other Ambulatory Visit: Payer: Self-pay

## 2022-11-04 ENCOUNTER — Ambulatory Visit (INDEPENDENT_AMBULATORY_CARE_PROVIDER_SITE_OTHER): Payer: Medicare Other | Admitting: Orthopedic Surgery

## 2022-11-04 DIAGNOSIS — M79602 Pain in left arm: Secondary | ICD-10-CM | POA: Diagnosis not present

## 2022-11-05 ENCOUNTER — Encounter: Payer: Self-pay | Admitting: Orthopedic Surgery

## 2022-11-05 NOTE — Progress Notes (Signed)
Office Visit Note   Patient: Debra Kidd           Date of Birth: 09-Mar-1961           MRN: [id number] Visit Date: 11/04/2022 Requested by: No referring provider defined for this encounter. PCP: Pcp, No  Subjective: Chief Complaint  Patient presents with   Other     Scan review     HPI: Debra Kidd is a 62 y.o. female who presents to the office reporting improved left shoulder pain.  Since she was last seen she had a CT scan which is reviewed today.  That scan shows no acute osseous abnormality or metastatic disease.  There is some thinning of the supraspinatus tendon and muscle atrophy which is generalized.  No actionable pathology in that left shoulder..                ROS: All systems reviewed are negative as they relate to the chief complaint within the history of present illness.  Patient denies fevers or chills.  Assessment & Plan: Visit Diagnoses:  1. Left arm pain     Plan: Impression is no metastatic disease in the left shoulder region.  Plan is continue with home exercise program and activity as tolerated.  Follow-up as needed.  Follow-Up Instructions: No follow-ups on file.   Orders:  No orders of the defined types were placed in this encounter.  No orders of the defined types were placed in this encounter.     Procedures: No procedures performed   Clinical Data: No additional findings.  Objective: Vital Signs: There were no vitals taken for this visit.  Physical Exam:  Constitutional: Patient appears well-developed HEENT:  Head: Normocephalic Eyes:EOM are normal Neck: Normal range of motion Cardiovascular: Normal rate Pulmonary/chest: Effort normal Neurologic: Patient is alert Skin: Skin is warm Psychiatric: Patient has normal mood and affect  Ortho Exam: Ortho exam demonstrates no coarse grinding or crepitus with internal/external rotation of that left arm.  Deltoid is functional.  Motor or sensory function of the hand is intact.  No  other masses lymphadenopathy or skin changes noted in that shoulder girdle region.  Range of motion unchanged from prior visit.  Specialty Comments:  No specialty comments available.  Imaging: No results found.   PMFS History: Patient Active Problem List   Diagnosis Date Noted   Ingrown toenail of left foot 09/23/2019   Decreased appetite 04/08/2019   Carotid stenosis 03/02/2019   Acute ischemic stroke ([place]) 01/29/2019   Left-sided weakness 01/29/2019   COVID-19 virus infection 01/29/2019   Encounter for antineoplastic immunotherapy 10/06/2018   Protein calorie malnutrition ([place]) 10/06/2018   Hypercalcemia due to severe hypomagnesemia 08/25/2018   Hypomagnesemia- Cisplastin Induced 08/25/2018   Hypokalemia due to persistent hypomagnesemia 08/25/2018   Thoracic compression fracture, closed, initial encounter-T5, T6 and T8 Acute 08/25/2018   Intractable back pain 08/24/2018   Antineoplastic chemotherapy induced pancytopenia (CODE) ([place]) 07/27/2018   Goals of care, counseling/discussion 06/11/2018   Peripheral edema 10/10/2016   Depression 08/07/2016   Anxiety 08/07/2016   Hypertension 08/07/2016   Antineoplastic chemotherapy induced anemia 08/07/2016   Encounter for antineoplastic chemotherapy 07/17/2016   Hypersensitivity reaction 06/28/2016   Small cell carcinoma of right lung ([place]) 05/30/2016   Mediastinal mass 05/13/2016   Past Medical History:  Diagnosis Date   Basal cell carcinoma of cheek    L side of face   Blood type, Rh positive    Cancer (HCC)    Chronic  pain syndrome    Depression 08/07/2016   Encounter for antineoplastic chemotherapy 07/17/2016   History of external beam radiation therapy 11/19/16-12/02/16   brain 25 Gy in 10 fractions   Hyperlipidemia    Hypertension    Hypertension 08/07/2016   Osteoporosis    Small cell lung cancer ([place]) dx'd 05/2016    Family History  Problem Relation Age of Onset   Heart disease Mother    Heart disease Father     Emphysema Brother    Breast cancer Maternal Grandmother    Heart disease Brother     Past Surgical History:  Procedure Laterality Date   ABDOMINAL HYSTERECTOMY  1981   APPENDECTOMY     at age 65   EYE SURGERY     left   IR GENERIC HISTORICAL  06/03/2016   IR FLUORO GUIDE PORT INSERTION RIGHT 06/03/2016 [facility]   IR GENERIC HISTORICAL  06/03/2016   IR US GUIDE VASC ACCESS RIGHT 06/03/2016 [facility]   SKIN CANCER EXCISION     basal cell carcinoma L side of face   Social History   Occupational History   Not on file  Tobacco Use   Smoking status: Every Day    Packs/day: 2.00    Years: 43.00    Total pack years: 86.00    Types: Cigarettes   Smokeless tobacco: Never  Vaping Use   Vaping Use: Never used  Substance and Sexual Activity   Alcohol use: Not Currently    Comment: quit drinking beer 02/2016   Drug use: Yes    Frequency: 7.0 times per week    Types: Marijuana   Sexual activity: Not on file

## 2022-11-06 ENCOUNTER — Other Ambulatory Visit: Payer: Self-pay

## 2022-11-15 NOTE — Progress Notes (Deleted)
[place]  [phone] [phone]) [phone]   Name: Debra Kidd Date: 11/15/2022 MRN: [id number]  DOB: 06-19-1961  Patient Care Team: Pcp, No as PCP - General    REASON FOR CONSULTATION: Debra Kidd is a 62 y.o. 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 Previous medical history also includes a stroke (01/2019) and chronic back pain. Palliative ask to see for symptom and pain management and goals of care.    SOCIAL HISTORY:     reports that she has been smoking cigarettes. She has a 86.00 pack-year smoking history. She has never used smokeless tobacco. She reports that she does not currently use alcohol. She reports current drug use. Frequency: 7.00 times per week. Drug: Marijuana.  ADVANCE DIRECTIVES:  None on file  CODE STATUS: Full code  PAST MEDICAL HISTORY: Past Medical History:  Diagnosis Date   Basal cell carcinoma of cheek    L side of face   Blood type, Rh positive    Cancer (HCC)    Chronic pain syndrome    Depression 08/07/2016   Encounter for antineoplastic chemotherapy 07/17/2016   History of external beam radiation therapy 11/19/16-12/02/16   brain 25 Gy in 10 fractions   Hyperlipidemia    Hypertension    Hypertension 08/07/2016   Osteoporosis    Small cell lung cancer ([place]) dx'd 05/2016    PAST SURGICAL HISTORY:  Past Surgical History:  Procedure Laterality Date   ABDOMINAL HYSTERECTOMY  1981   APPENDECTOMY     at age 34   EYE SURGERY     left   IR GENERIC HISTORICAL  06/03/2016   IR FLUORO GUIDE PORT INSERTION RIGHT 06/03/2016 [facility]   IR GENERIC HISTORICAL  06/03/2016   IR US GUIDE VASC ACCESS RIGHT 06/03/2016 [facility]   SKIN CANCER EXCISION     basal cell carcinoma L side of face    HEMATOLOGY/ONCOLOGY HISTORY:  Oncology History  Small cell carcinoma of right lung ([place])  05/30/2016 Initial  Diagnosis   Small cell carcinoma of right lung ([place])   06/22/2018 -  Chemotherapy   Patient is on Treatment Plan : LUNG SCLC Carboplatin + Etoposide + Atezolizumab Induction q21d x 4 cycles / Atezolizumab Maintenance q21d       ALLERGIES:  is allergic to codeine, motrin [ibuprofen], thiazide-type diuretics, and vicodin [hydrocodone-acetaminophen].  MEDICATIONS:  Current Outpatient Medications  Medication Sig Dispense Refill   diazepam (VALIUM) 10 MG tablet Take 1 tablet (10 mg total) by mouth See admin instructions. Take 1 30 minutes before MRI scan for anxiety. 6 tablet 0   atorvastatin (LIPITOR) 80 MG tablet Take 1 tablet (80 mg total) by mouth daily. 30 tablet 11   clopidogrel (PLAVIX) 75 MG tablet Take 1 tablet (75 mg total) by mouth daily. 90 tablet 3   diazepam (VALIUM) 5 MG tablet Take 5 mg by mouth 3 (three) times daily as needed for anxiety.   2   lidocaine-prilocaine (EMLA) cream Apply 1 Application topically as needed. 30 g 2   oxyCODONE-acetaminophen (PERCOCET/ROXICET) 5-325 MG tablet TAKE ONE TABLET BY MOUTH EVERY 6 HOURS AS NEEDED FOR SEVERE PAIN 15 tablet 0   potassium chloride 20 MEQ/15ML (10%) SOLN Take 15 mLs (20 mEq total) by mouth daily. 80 mL 0   Vitamin D, Ergocalciferol, [name],000 Units by mouth 3 (three) times a  week.     No current facility-administered medications for this visit.    VITAL SIGNS: There were no vitals taken for this visit. There were no vitals filed for this visit.  Estimated body mass index is 16.74 kg/m as calculated from the following:   Height as of 10/09/22: 5\' 3"  (1.6 m).   Weight as of 10/30/22: 94 lb 8 oz (42.9 kg).  LABS: CBC:    Component Value Date/Time   WBC 5.8 10/30/2022 1048   WBC 7.2 01/27/2020 1341   HGB 11.8 (L) 10/30/2022 1048   HGB 12.6 08/01/2017 0836   HCT 32.5 (L) 10/30/2022 1048   HCT 37.8 08/01/2017 0836   PLT 213 10/30/2022 1048   PLT 292 08/01/2017 0836   MCV  106.6 (H) 10/30/2022 1048   MCV 105.6 (H) 08/01/2017 0836   NEUTROABS 3.5 10/30/2022 1048   NEUTROABS 5.8 08/01/2017 0836   LYMPHSABS 1.7 10/30/2022 1048   LYMPHSABS 2.1 08/01/2017 0836   MONOABS 0.4 10/30/2022 1048   MONOABS 0.4 08/01/2017 0836   EOSABS 0.1 10/30/2022 1048   EOSABS 0.1 08/01/2017 0836   BASOSABS 0.0 10/30/2022 1048   BASOSABS 0.0 08/01/2017 0836   Comprehensive Metabolic Panel:    Component Value Date/Time   NA 137 10/30/2022 1048   NA 141 08/01/2017 0835   K 3.2 (L) 10/30/2022 1048   K 3.5 08/01/2017 0835   CL 101 10/30/2022 1048   CO2 30 10/30/2022 1048   CO2 25 08/01/2017 0835   BUN <5 (L) 10/30/2022 1048   BUN 8.0 08/01/2017 0835   CREATININE 0.55 10/30/2022 1048   CREATININE 0.9 08/01/2017 0835   GLUCOSE 96 10/30/2022 1048   GLUCOSE 103 08/01/2017 0835   CALCIUM 8.2 (L) 10/30/2022 1048   CALCIUM 9.9 08/01/2017 0835   AST 16 10/30/2022 1048   AST 15 08/01/2017 0835   ALT 10 10/30/2022 1048   ALT 19 08/01/2017 0835   ALKPHOS 116 10/30/2022 1048   ALKPHOS 142 08/01/2017 0835   BILITOT 0.7 10/30/2022 1048   BILITOT 0.35 08/01/2017 0835   PROT 5.8 (L) 10/30/2022 1048   PROT 7.8 08/01/2017 0835   ALBUMIN 3.5 10/30/2022 1048   ALBUMIN 3.9 08/01/2017 0835    RADIOGRAPHIC STUDIES:   PERFORMANCE STATUS (ECOG) : {CHL ONC ECOG [id number]}  Review of Systems Unless otherwise noted, a complete review of systems is negative.  Physical Exam General: NAD Cardiovascular: regular rate and rhythm Pulmonary: clear ant fields Abdomen: soft, nontender, + bowel sounds GU: no suprapubic tenderness Extremities: no edema, no joint deformities Skin: no rashes Neurological:  IMPRESSION: *** I introduced myself, Niti Leisure RN, and Palliative's role in collaboration with the oncology team. Concept of Palliative Care was introduced as specialized medical care for people and their families living with serious illness.  It focuses on providing relief from the  symptoms and stress of a serious illness.  The goal is to improve quality of life for both the patient and the family. Values and goals of care important to patient and family were attempted to be elicited.    We discussed *** current illness and what it means in the larger context of *** on-going co-morbidities. Natural disease trajectory and expectations were discussed.  I discussed the importance of continued conversation with family and their medical providers regarding overall plan of care and treatment options, ensuring decisions are within the context of the patients values and GOCs.  PLAN: Established therapeutic relationship. Education provided on palliative's role in collaboration with their  Oncology/Radiation team. I will plan to see patient back in 2-4 weeks in collaboration to other oncology appointments.    Patient expressed understanding and was in agreement with this plan. She also understands that She can call the clinic at any time with any questions, concerns, or complaints.   Thank you for your referral and allowing Palliative to assist in Mrs. Debra Kidd's care.   Number and complexity of problems addressed: ***HIGH - 1 or more chronic illnesses with SEVERE exacerbation, progression, or side effects of treatment - advanced cancer, pain. Any controlled substances utilized were prescribed in the context of palliative care.  Time Total: ***  Visit consisted of counseling and education dealing with the complex and emotionally intense issues of symptom management and palliative care in the setting of serious and potentially life-threatening illness.Greater than 50%  of this time was spent counseling and coordinating care related to the above assessment and plan.  Signed by: Alda Lea, AGPCNP-BC Palliative Medicine Team/[place]   *Please note that this is a verbal dictation therefore any spelling or grammatical errors are due to the "[place] [employer]" system interpretation.

## 2022-11-19 ENCOUNTER — Inpatient Hospital Stay: Payer: Medicare Other | Admitting: Nurse Practitioner

## 2022-11-19 ENCOUNTER — Telehealth: Payer: Self-pay

## 2022-11-19 NOTE — Telephone Encounter (Signed)
RN called and r/s pt palliative appt for Wednesday when pt has flush and infusion appts. Pt verbalized understanding, no further needs at this time.

## 2022-11-20 ENCOUNTER — Inpatient Hospital Stay: Payer: Medicare Other

## 2022-11-20 ENCOUNTER — Inpatient Hospital Stay: Payer: Medicare Other | Attending: Internal Medicine

## 2022-11-20 ENCOUNTER — Inpatient Hospital Stay (HOSPITAL_BASED_OUTPATIENT_CLINIC_OR_DEPARTMENT_OTHER): Payer: Medicare Other | Admitting: Nurse Practitioner

## 2022-11-20 ENCOUNTER — Inpatient Hospital Stay: Payer: Medicare Other | Admitting: Dietician

## 2022-11-20 ENCOUNTER — Other Ambulatory Visit: Payer: Medicare Other

## 2022-11-20 ENCOUNTER — Other Ambulatory Visit: Payer: Self-pay

## 2022-11-20 ENCOUNTER — Inpatient Hospital Stay (HOSPITAL_BASED_OUTPATIENT_CLINIC_OR_DEPARTMENT_OTHER): Payer: Medicare Other | Admitting: Internal Medicine

## 2022-11-20 ENCOUNTER — Encounter: Payer: Self-pay | Admitting: Internal Medicine

## 2022-11-20 ENCOUNTER — Encounter: Payer: Self-pay | Admitting: Nurse Practitioner

## 2022-11-20 VITALS — BP 105/78 | HR 74 | Resp 18

## 2022-11-20 VITALS — BP 107/74 | HR 79 | Temp 98.2°F | Resp 19 | Wt 94.5 lb

## 2022-11-20 DIAGNOSIS — C3411 Malignant neoplasm of upper lobe, right bronchus or lung: Secondary | ICD-10-CM | POA: Insufficient documentation

## 2022-11-20 DIAGNOSIS — C7889 Secondary malignant neoplasm of other digestive organs: Secondary | ICD-10-CM | POA: Diagnosis not present

## 2022-11-20 DIAGNOSIS — K5900 Constipation, unspecified: Secondary | ICD-10-CM | POA: Diagnosis not present

## 2022-11-20 DIAGNOSIS — E876 Hypokalemia: Secondary | ICD-10-CM

## 2022-11-20 DIAGNOSIS — G893 Neoplasm related pain (acute) (chronic): Secondary | ICD-10-CM

## 2022-11-20 DIAGNOSIS — Z5112 Encounter for antineoplastic immunotherapy: Secondary | ICD-10-CM | POA: Insufficient documentation

## 2022-11-20 DIAGNOSIS — C3491 Malignant neoplasm of unspecified part of right bronchus or lung: Secondary | ICD-10-CM

## 2022-11-20 DIAGNOSIS — F419 Anxiety disorder, unspecified: Secondary | ICD-10-CM

## 2022-11-20 DIAGNOSIS — Z515 Encounter for palliative care: Secondary | ICD-10-CM

## 2022-11-20 LAB — CBC WITH DIFFERENTIAL (CANCER CENTER ONLY)
Abs Immature Granulocytes: 0.01 10*3/uL (ref 0.00–0.07)
Basophils Absolute: 0 10*3/uL (ref 0.0–0.1)
Basophils Relative: 1 %
Eosinophils Absolute: 0.1 10*3/uL (ref 0.0–0.5)
Eosinophils Relative: 2 %
HCT: 32.1 % — ABNORMAL LOW (ref 36.0–46.0)
Hemoglobin: 11.6 g/dL — ABNORMAL LOW (ref 12.0–15.0)
Immature Granulocytes: 0 %
Lymphocytes Relative: 31 %
Lymphs Abs: 1.7 10*3/uL (ref 0.7–4.0)
MCH: 38.4 pg — ABNORMAL HIGH (ref 26.0–34.0)
MCHC: 36.1 g/dL — ABNORMAL HIGH (ref 30.0–36.0)
MCV: 106.3 fL — ABNORMAL HIGH (ref 80.0–100.0)
Monocytes Absolute: 0.4 10*3/uL (ref 0.1–1.0)
Monocytes Relative: 7 %
Neutro Abs: 3.3 10*3/uL (ref 1.7–7.7)
Neutrophils Relative %: 59 %
Platelet Count: 224 10*3/uL (ref 150–400)
RBC: 3.02 MIL/uL — ABNORMAL LOW (ref 3.87–5.11)
RDW: 12.5 % (ref 11.5–15.5)
WBC Count: 5.5 10*3/uL (ref 4.0–10.5)
nRBC: 0 % (ref 0.0–0.2)

## 2022-11-20 LAB — CMP (CANCER CENTER ONLY)
ALT: 10 U/L (ref 0–44)
AST: 16 U/L (ref 15–41)
Albumin: 3.7 g/dL (ref 3.5–5.0)
Alkaline Phosphatase: 102 U/L (ref 38–126)
Anion gap: 8 (ref 5–15)
BUN: 6 mg/dL — ABNORMAL LOW (ref 8–23)
CO2: 26 mmol/L (ref 22–32)
Calcium: 8.6 mg/dL — ABNORMAL LOW (ref 8.9–10.3)
Chloride: 101 mmol/L (ref 98–111)
Creatinine: 0.65 mg/dL (ref 0.44–1.00)
GFR, Estimated: >60 mL/min (ref >60)
Glucose, Bld: 128 mg/dL — ABNORMAL HIGH (ref 70–99)
Potassium: 2.8 mmol/L — ABNORMAL LOW (ref 3.5–5.1)
Sodium: 135 mmol/L (ref 135–145)
Total Bilirubin: 0.6 mg/dL (ref 0.3–1.2)
Total Protein: 6.1 g/dL — ABNORMAL LOW (ref 6.5–8.1)

## 2022-11-20 MED ORDER — HEPARIN SOD (PORK) LOCK FLUSH 100 UNIT/ML IV SOLN
500.0000 [IU] | Freq: Once | INTRAVENOUS | Status: AC | PRN
  Administered 2022-11-20: 500 [IU]

## 2022-11-20 MED ORDER — SODIUM CHLORIDE 0.9% FLUSH
10.0000 mL | INTRAVENOUS | Status: DC | PRN
  Administered 2022-11-20: 10 mL

## 2022-11-20 MED ORDER — OXYCODONE-ACETAMINOPHEN 5-325 MG PO TABS: 1.0000 | ORAL_TABLET | Freq: Four times a day (QID) | ORAL | 0 refills | Status: DC | PRN

## 2022-11-20 MED ORDER — DIAZEPAM 5 MG PO TABS: 5.0000 mg | ORAL_TABLET | Freq: Two times a day (BID) | ORAL | 2 refills | Status: DC | PRN

## 2022-11-20 MED ORDER — POTASSIUM CHLORIDE CRYS ER 20 MEQ PO TBCR: 20.0000 meq | EXTENDED_RELEASE_TABLET | Freq: Two times a day (BID) | ORAL | 0 refills | Status: DC

## 2022-11-20 MED ORDER — SODIUM CHLORIDE 0.9 % IV SOLN
1200.0000 mg | Freq: Once | INTRAVENOUS | Status: AC
  Administered 2022-11-20: 1200 mg via INTRAVENOUS
  Filled 2022-11-20: qty 20

## 2022-11-20 MED ORDER — SODIUM CHLORIDE 0.9 % IV SOLN: Freq: Once | INTRAVENOUS | Status: AC

## 2022-11-20 NOTE — Patient Instructions (Signed)
Steps to Quit Smoking Smoking tobacco is the leading cause of preventable death. It can affect almost every organ in the body. Smoking puts you and people around you at risk for many serious, long-lasting (chronic) diseases. Quitting smoking can be hard, but it is one of the best things that you can do for your health. It is never too late to quit. Do not give up if you cannot quit the first time. Some people need to try many times to quit. Do your best to stick to your quit plan, and talk with your doctor if you have any questions or concerns. How do I get ready to quit? Pick a date to quit. Set a date within the next 2 weeks to give you time to prepare. Write down the reasons why you are quitting. Keep this list in places where you will see it often. Tell your family, friends, and co-workers that you are quitting. Their support is important. Talk with your doctor about the choices that may help you quit. Find out if your health insurance will pay for these treatments. Know the people, places, things, and activities that make you want to smoke (triggers). Avoid them. What first steps can I take to quit smoking? Throw away all cigarettes at home, at work, and in your car. Throw away the things that you use when you smoke, such as ashtrays and lighters. Clean your car. Empty the ashtray. Clean your home, including curtains and carpets. What can I do to help me quit smoking? Talk with your doctor about taking medicines and seeing a counselor. You are more likely to succeed when you do both. If you are pregnant or breastfeeding: Talk with your doctor about counseling or other ways to quit smoking. Do not take medicine to help you quit smoking unless your doctor tells you to. Quit right away Quit smoking completely, instead of slowly cutting back on how much you smoke over a period of time. Stopping smoking right away may be more successful than slowly quitting. Go to counseling. In-person is best  if this is an option. You are more likely to quit if you go to counseling sessions regularly. Take medicine You may take medicines to help you quit. Some medicines need a prescription, and some you can buy over-the-counter. Some medicines may contain a drug called nicotine to replace the nicotine in cigarettes. Medicines may: Help you stop having the desire to smoke (cravings). Help to stop the problems that come when you stop smoking (withdrawal symptoms). Your doctor may ask you to use: Nicotine patches, gum, or lozenges. Nicotine inhalers or sprays. Non-nicotine medicine that you take by mouth. Find resources Find resources and other ways to help you quit smoking and remain smoke-free after you quit. They include: Online chats with a counselor. Phone quitlines. Printed self-help materials. Support groups or group counseling. Text messaging programs. Mobile phone apps. Use apps on your mobile phone or tablet that can help you stick to your quit plan. Examples of free services include Quit Guide from the CDC and smokefree.gov  What can I do to make it easier to quit?  Talk to your family and friends. Ask them to support and encourage you. Call a phone quitline, such as 1-800-QUIT-NOW, reach out to support groups, or work with a counselor. Ask people who smoke to not smoke around you. Avoid places that make you want to smoke, such as: Bars. Parties. Smoke-break areas at work. Spend time with people who do not smoke. Lower   the stress in your life. Stress can make you want to smoke. Try these things to lower stress: Getting regular exercise. Doing deep-breathing exercises. Doing yoga. Meditating. What benefits will I see if I quit smoking? Over time, you may have: A better sense of smell and taste. Less coughing and sore throat. A slower heart rate. Lower blood pressure. Clearer skin. Better breathing. Fewer sick days. Summary Quitting smoking can be hard, but it is one of  the best things that you can do for your health. Do not give up if you cannot quit the first time. Some people need to try many times to quit. When you decide to quit smoking, make a plan to help you succeed. Quit smoking right away, not slowly over a period of time. When you start quitting, get help and support to keep you smoke-free. This information is not intended to replace advice given to you by your health care provider. Make sure you discuss any questions you have with your health care provider. Document Revised: 08/10/2021 Document Reviewed: 08/10/2021 Elsevier Patient Education  2023 [employer] Inc.  

## 2022-11-20 NOTE — Progress Notes (Addendum)
[place]  [phone] [phone]) [phone]   Name: HAARIKA COMBEE Date: 11/20/2022 MRN: [id number]  DOB: 01/13/1961  Patient Care Team: Pcp, No as PCP - General    REASON FOR CONSULTATION: Debra Kidd is a 62 y.o. female with oncologic medical history including extensive stage small cell lung cancer with metastatic disease to the pancreas and spleen (05/2016) s/p chemotherapy, SRS to right upper lobe (10/2017), currently on maintenance Tecentriq.  Palliative ask to see for symptom management and goals of care.    SOCIAL HISTORY:     reports that she has been smoking cigarettes. She has a 86.00 pack-year smoking history. She has never used smokeless tobacco. She reports that she does not currently use alcohol. She reports current drug use. Frequency: 7.00 times per week. Drug: Marijuana.  ADVANCE DIRECTIVES:    CODE STATUS:   PAST MEDICAL HISTORY: Past Medical History:  Diagnosis Date   Basal cell carcinoma of cheek    L side of face   Blood type, Rh positive    Cancer (HCC)    Chronic pain syndrome    Depression 08/07/2016   Encounter for antineoplastic chemotherapy 07/17/2016   History of external beam radiation therapy 11/19/16-12/02/16   brain 25 Gy in 10 fractions   Hyperlipidemia    Hypertension    Hypertension 08/07/2016   Osteoporosis    Small cell lung cancer ([place]) dx'd 05/2016    PAST SURGICAL HISTORY:  Past Surgical History:  Procedure Laterality Date   ABDOMINAL HYSTERECTOMY  1981   APPENDECTOMY     at age 89   EYE SURGERY     left   IR GENERIC HISTORICAL  06/03/2016   IR FLUORO GUIDE PORT INSERTION RIGHT 06/03/2016 [facility]   IR GENERIC HISTORICAL  06/03/2016   IR US GUIDE VASC ACCESS RIGHT 06/03/2016 [facility]   SKIN CANCER EXCISION     basal cell carcinoma L side of face    HEMATOLOGY/ONCOLOGY HISTORY:  Oncology History  Small cell carcinoma of right lung ([place])  05/30/2016 Initial  Diagnosis   Small cell carcinoma of right lung ([place])   06/22/2018 -  Chemotherapy   Patient is on Treatment Plan : LUNG SCLC Carboplatin + Etoposide + Atezolizumab Induction q21d x 4 cycles / Atezolizumab Maintenance q21d       ALLERGIES:  is allergic to codeine, motrin [ibuprofen], thiazide-type diuretics, and vicodin [hydrocodone-acetaminophen].  MEDICATIONS:  Current Outpatient Medications  Medication Sig Dispense Refill   atorvastatin (LIPITOR) 80 MG tablet Take 1 tablet (80 mg total) by mouth daily. (Patient not taking: Reported on 11/20/2022) 30 tablet 11   clopidogrel (PLAVIX) 75 MG tablet Take 1 tablet (75 mg total) by mouth daily. (Patient not taking: Reported on 11/20/2022) 90 tablet 3   diazepam (VALIUM) 5 MG tablet Take 1 tablet (5 mg total) by mouth every 12 (twelve) hours as needed for anxiety. 30 tablet 2   lidocaine-prilocaine (EMLA) cream Apply 1 Application topically as needed. 30 g 2   oxyCODONE-acetaminophen (PERCOCET/ROXICET) 5-325 MG tablet Take 1 tablet by mouth every 6 (six) hours as needed for severe pain. 60 tablet 0   potassium chloride SA (KLOR-CON M) 20 MEQ tablet Take 1 tablet (20 mEq total) by mouth 2 (two) times daily. 20 tablet 0   Vitamin D, Ergocalciferol, [name],000 Units by mouth 3 (three) times a week.     No current facility-administered medications for  this visit.    VITAL SIGNS: There were no vitals taken for this visit. There were no vitals filed for this visit.  Estimated body mass index is 16.74 kg/m as calculated from the following:   Height as of 10/09/22: 5\' 3"  (1.6 m).   Weight as of an earlier encounter on 11/20/22: 94 lb 8 oz (42.9 kg).  LABS: CBC:    Component Value Date/Time   WBC 5.5 11/20/2022 1026   WBC 7.2 01/27/2020 1341   HGB 11.6 (L) 11/20/2022 1026   HGB 12.6 08/01/2017 0836   HCT 32.1 (L) 11/20/2022 1026   HCT 37.8 08/01/2017 0836   PLT 224 11/20/2022 1026   PLT 292  08/01/2017 0836   MCV 106.3 (H) 11/20/2022 1026   MCV 105.6 (H) 08/01/2017 0836   NEUTROABS 3.3 11/20/2022 1026   NEUTROABS 5.8 08/01/2017 0836   LYMPHSABS 1.7 11/20/2022 1026   LYMPHSABS 2.1 08/01/2017 0836   MONOABS 0.4 11/20/2022 1026   MONOABS 0.4 08/01/2017 0836   EOSABS 0.1 11/20/2022 1026   EOSABS 0.1 08/01/2017 0836   BASOSABS 0.0 11/20/2022 1026   BASOSABS 0.0 08/01/2017 0836   Comprehensive Metabolic Panel:    Component Value Date/Time   NA 135 11/20/2022 1026   NA 141 08/01/2017 0835   K 2.8 (L) 11/20/2022 1026   K 3.5 08/01/2017 0835   CL 101 11/20/2022 1026   CO2 26 11/20/2022 1026   CO2 25 08/01/2017 0835   BUN 6 (L) 11/20/2022 1026   BUN 8.0 08/01/2017 0835   CREATININE 0.65 11/20/2022 1026   CREATININE 0.9 08/01/2017 0835   GLUCOSE 128 (H) 11/20/2022 1026   GLUCOSE 103 08/01/2017 0835   CALCIUM 8.6 (L) 11/20/2022 1026   CALCIUM 9.9 08/01/2017 0835   AST 16 11/20/2022 1026   AST 15 08/01/2017 0835   ALT 10 11/20/2022 1026   ALT 19 08/01/2017 0835   ALKPHOS 102 11/20/2022 1026   ALKPHOS 142 08/01/2017 0835   BILITOT 0.6 11/20/2022 1026   BILITOT 0.35 08/01/2017 0835   PROT 6.1 (L) 11/20/2022 1026   PROT 7.8 08/01/2017 0835   ALBUMIN 3.7 11/20/2022 1026   ALBUMIN 3.9 08/01/2017 0835    RADIOGRAPHIC STUDIES: MR Shoulder Left w/o contrast  Result Date: 10/29/2022 CLINICAL DATA:  Possible occult metastatic disease EXAM: MRI OF THE LEFT SHOULDER WITHOUT CONTRAST TECHNIQUE: Multiplanar, multisequence MR imaging of the shoulder was performed. No intravenous contrast was administered. COMPARISON:  CT 09/18/2022 FINDINGS: Rotator cuff: There is thinning of the distal supraspinatus tendon without any edema signal. The infraspinatus is intact. Teres minor tendon is intact. Subscapularis tendon is intact. Muscles: Generalized loss of muscle bulk of the left shoulder musculature. Biceps Long Head: Intraarticular and extraarticular portions of the biceps tendon are  intact. Acromioclavicular Joint: Mild arthropathy of the acromioclavicular joint. No significant subacromial/subdeltoid bursal fluid . Glenohumeral Joint: No joint effusion. Mild chondrosis. Labrum: Degenerative posterior labral blunting. Bones: No evidence of acute fracture. There is no focal bone lesion. Other: No focal fluid collection.  No lymphadenopathy. IMPRESSION: No acute osseous abnormality or focal bone lesion. Thinning of the distal supraspinatus tendon without any edema signal, likely reflecting chronic partial tearing. No high-grade or retracted cuff tear. Generalized loss of muscle bulk of the left shoulder musculature. Mild glenohumeral and AC joint osteoarthritis. Electronically Signed   By: Maurine Simmering M.D.   On: 10/29/2022 12:31    PERFORMANCE STATUS (ECOG) : 1 - Symptomatic but completely ambulatory  Review of Systems  Constitutional:  Positive for fatigue.  Musculoskeletal:  Positive for arthralgias and back pain.  Unless otherwise noted, a complete review of systems is negative.  Physical Exam General: NAD, thin, wheelchair  Cardiovascular: regular rate and rhythm Pulmonary: normal breathing pattern  Extremities: no edema, no joint deformities Skin: no rashes Neurological:AAO x4, mood appropriate   IMPRESSION: This is my initial visit with Mrs. Wix. No acute distress noted. No family present. Patient in wheelchair. Wearing sunglasses.  Alert and able to engage appropriately in decisions.   I introduced myself, [place] RN, and Palliative's role in collaboration with the oncology team. Concept of Palliative Care was introduced as specialized medical care for people and their families living with serious illness.  It focuses on providing relief from the symptoms and stress of a serious illness.  The goal is to improve quality of life for both the patient and the family. Values and goals of care important to patient and family were attempted to be elicited.   Mrs. Kou  lives in the with her husband of more than 26 years.  She has 2 sons.  No grandchildren.  Her oldest son lives in the home with her.  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  Patient is independent of ADLs in the home with some limitations due to pain and fatigue.  She utilizes a shower chair, walker, and bedside commode.  Chronic pain Satoria endorses ongoing chest/rib pain, back pain, and bilateral leg pain.  Her pain sometimes interrupts her sleep and due to severity.  She also endorses bilateral lower extremity neuropathy.  She describes pain as sharp, stabbing, aching, throbbing.  Reports nothing makes pain completely better however some improvement with medication.  Pain worsens with increased activity.  We reviewed patient's current regimen.  She is taking hydrocodone 5/325 as needed.  She rates her pain at worst 8 out of 10.  Reports pain decreases to 3-4 out of 10 with medication.  Detailed chart review performed.  Patient was previously being seen by [facility] medical pain clinic where she was receiving hydrocodone and Valium.  All the recent drug UDS have been negative.  Extensive medication, physical, and psychosocial assessment completed.  Patient denies use of illicit drugs however does endorse occasional use for marijuana which she feels helps her appetite.  Detailed education provided on the role of palliative in her pain management.  I reviewed at length patient's responsibility and expectations to allow for safe medication management and ongoing support.  Pain contract thoroughly reviewed and patient willingly signed.  She understands any violations of the contract can and will result in automatic dismissal and cease of medication prescriber.  Will continue patient on hydrocodone as previously prescribed.  Will continue to closely monitor.  Could potentially consider long-acting medication.  Constipation Patient endorses constipation however is  controlled with use of Senna-S.   Anxiety/Insomnia Mrs. Engelkes shares that she has struggled with anxiety for several years. Also with insomnia. She has not formally been followed by a Psychologist. On chart review patient has been taking valium for her anxiety and insomnia. Agreeable to refill however patient also aware of the importance of establishing care with a PCP.   PLAN: Established therapeutic relationship. Education provided on palliative's role in collaboration with their Oncology/Radiation team. Oxycodone 5/325 1 tablet every 6 hours as needed for pain Valium 5 mg 1 tablet every 12 hours as needed for anxiety/insomnia Pain contract completed. Senna S daily for bowel regimen I will plan to see  patient back in 2-4 weeks in collaboration to other oncology appointments.    Patient expressed understanding and was in agreement with this plan. She also understands that She can call the clinic at any time with any questions, concerns, or complaints.   Thank you for your referral and allowing Palliative to assist in Mrs. Michaline Fixler Chalfin's care.   Number and complexity of problems addressed: HIGH - 1 or more chronic illnesses with SEVERE exacerbation, progression, or side effects of treatment - advanced cancer, pain. Any controlled substances utilized were prescribed in the context of palliative care.  Time Total: 50 min   Visit consisted of counseling and education dealing with the complex and emotionally intense issues of symptom management and palliative care in the setting of serious and potentially life-threatening illness.Greater than 50%  of this time was spent counseling and coordinating care related to the above assessment and plan.  Signed by: Alda Lea, AGPCNP-BC Palliative Medicine Team/[place]   *Please note that this is a verbal dictation therefore any spelling or grammatical errors are due to the "[place] [facility]" system interpretation.

## 2022-11-20 NOTE — Progress Notes (Signed)
[facility] [phone]   [phone]) [phone]  OFFICE PROGRESS NOTE  Pcp, [name], N3, [name], large right anterior mediastinal and supraclavicular lymphadenopathy as well as pancreatic and splenic metastasis diagnosed in September 2017.  The patient had disease progression in October 2019.  PRIOR THERAPY:  1) Systemic chemotherapy was carboplatin for AUC of 5 on day 1 and etoposide 100 MG/M2 on days 1, 2 and 3 with Neulasta support. Status post 6 cycles with significant response of her disease. 2) Prophylactic cranial irradiation under the care of Dr. Sondra Come on 12/02/2016. 3) stereotactic radiotherapy to the recurrent right upper lobe pulmonary nodule under the care of Dr. Sondra Come completed November 10, 2017. 4) Retreatment with systemic chemotherapy with carboplatin for AUC of 5 on day 1 and etoposide 100 mg/M2 on days 1, 2 and 3 as well as Tecentriq (Atezolizumab) 1200 mg IV every 3 weeks with Neulasta support.  First dose June 22, 2018 for disease recurrence.  Status post 5 cycles.  Starting from cycle #2 her dose of carboplatin will be reduced to AUC of 4 and etoposide 80 mg/M2 on days 1, 2 and 3 in addition to the regular dose of Tecentriq.  CURRENT THERAPY: Maintenance treatment with single agent Tecentriq 1200 mg IV every 3 weeks.  Status post 66 cycles.  INTERVAL HISTORY: Debra Kidd 62 y.o. female returns to the clinic today for follow-up visit.  The patient does not have a primary care physician right now since her previous provider retired.  No one in her local area is accepting new patient at this point.  She denied having any current chest pain, shortness of breath, cough or hemoptysis.  She has no nausea, vomiting, diarrhea or constipation.  She has no headache or visual changes.  She has occasional dysphagia with the medication is stuck in  the upper esophagus.  She is here today for evaluation before starting cycle #67 of her maintenance treatment.   MEDICAL HISTORY: Past Medical History:  Diagnosis Date   Basal cell carcinoma of cheek    L side of face   Blood type, Rh positive    Cancer (HCC)    Chronic pain syndrome    Depression 08/07/2016   Encounter for antineoplastic chemotherapy 07/17/2016   History of external beam radiation therapy 11/19/16-12/02/16   brain 25 Gy in 10 fractions   Hyperlipidemia    Hypertension    Hypertension 08/07/2016   Osteoporosis    Small cell lung cancer ([place]) dx'd 05/2016    ALLERGIES:  is allergic to codeine, motrin [ibuprofen], thiazide-type diuretics, and vicodin [hydrocodone-acetaminophen].  MEDICATIONS:  Current Outpatient Medications  Medication Sig Dispense Refill   diazepam (VALIUM) 10 MG tablet Take 1 tablet (10 mg total) by mouth See admin instructions. Take 1 30 minutes before MRI scan for anxiety. 6 tablet 0   atorvastatin (LIPITOR) 80 MG tablet Take 1 tablet (80 mg total) by mouth daily. 30 tablet 11   clopidogrel (PLAVIX) 75 MG tablet Take 1 tablet (75 mg total) by mouth daily. 90 tablet 3   diazepam (VALIUM) 5 MG tablet Take 5 mg by mouth 3 (three) times daily as needed for anxiety.   2   lidocaine-prilocaine (EMLA) cream Apply 1 Application topically as needed. 30 g 2   oxyCODONE-acetaminophen (PERCOCET/ROXICET) 5-325 MG tablet TAKE ONE TABLET BY MOUTH EVERY 6 HOURS AS  NEEDED FOR SEVERE PAIN 15 tablet 0   potassium chloride 20 MEQ/15ML (10%) SOLN Take 15 mLs (20 mEq total) by mouth daily. 80 mL 0   Vitamin D, Ergocalciferol, [name],000 Units by mouth 3 (three) times a week.     No current facility-administered medications for this visit.   Facility-Administered Medications Ordered in Other Visits  Medication Dose Route Frequency Provider Last Rate Last Admin   sodium chloride flush (NS) 0.9 % injection 10 mL  10 mL  Intracatheter PRN [name], MD   10 mL at 11/20/22 1029    SURGICAL HISTORY:  Past Surgical History:  Procedure Laterality Date   ABDOMINAL HYSTERECTOMY  1981   APPENDECTOMY     at age 65   EYE SURGERY     left   IR GENERIC HISTORICAL  06/03/2016   IR FLUORO GUIDE PORT INSERTION RIGHT 06/03/2016 [facility]   IR GENERIC HISTORICAL  06/03/2016   IR US GUIDE VASC ACCESS RIGHT 06/03/2016 [facility]   SKIN CANCER EXCISION     basal cell carcinoma L side of face    REVIEW OF SYSTEMS:  A comprehensive review of systems was negative except for: Constitutional: positive for fatigue Musculoskeletal: positive for arthralgias Behavioral/Psych: positive for anxiety   PHYSICAL EXAMINATION: General appearance: alert, cooperative, fatigued, and no distress Head: Normocephalic, without obvious abnormality, atraumatic Neck: no adenopathy, no JVD, supple, symmetrical, trachea midline, and thyroid not enlarged, symmetric, no tenderness/mass/nodules Lymph nodes: Cervical, supraclavicular, and axillary nodes normal. Resp: clear to auscultation bilaterally Back: symmetric, no curvature. ROM normal. No CVA tenderness. Cardio: regular rate and rhythm, S1, S2 normal, no murmur, click, rub or gallop GI: soft, non-tender; bowel sounds normal; no masses,  no organomegaly Extremities: extremities normal, atraumatic, no cyanosis or edema  ECOG PERFORMANCE STATUS: 1 - Symptomatic but completely ambulatory  Blood pressure 107/74, pulse 79, temperature 98.2 F (36.8 C), temperature source Oral, resp. rate 19, weight 94 lb 8 oz (42.9 kg), SpO2 100 %.  LABORATORY DATA: Lab Results  Component Value Date   WBC 5.5 11/20/2022   HGB 11.6 (L) 11/20/2022   HCT 32.1 (L) 11/20/2022   MCV 106.3 (H) 11/20/2022   PLT 224 11/20/2022      Chemistry      Component Value Date/Time   NA 137 10/30/2022 1048   NA 141 08/01/2017 0835   K 3.2 (L) 10/30/2022 1048   K 3.5 08/01/2017 0835   CL 101 10/30/2022  1048   CO2 30 10/30/2022 1048   CO2 25 08/01/2017 0835   BUN <5 (L) 10/30/2022 1048   BUN 8.0 08/01/2017 0835   CREATININE 0.55 10/30/2022 1048   CREATININE 0.9 08/01/2017 0835      Component Value Date/Time   CALCIUM 8.2 (L) 10/30/2022 1048   CALCIUM 9.9 08/01/2017 0835   ALKPHOS 116 10/30/2022 1048   ALKPHOS 142 08/01/2017 0835   AST 16 10/30/2022 1048   AST 15 08/01/2017 0835   ALT 10 10/30/2022 1048   ALT 19 08/01/2017 0835   BILITOT 0.7 10/30/2022 1048   BILITOT 0.35 08/01/2017 0835       RADIOGRAPHIC STUDIES: MR Shoulder Left w/o contrast  Result Date: 10/29/2022 CLINICAL DATA:  Possible occult metastatic disease EXAM: MRI OF THE LEFT SHOULDER WITHOUT CONTRAST TECHNIQUE: Multiplanar, multisequence MR imaging of the shoulder was performed. No intravenous contrast was administered. COMPARISON:  CT 09/18/2022 FINDINGS: Rotator cuff: There is thinning of the distal supraspinatus tendon  without any edema signal. The infraspinatus is intact. Teres minor tendon is intact. Subscapularis tendon is intact. Muscles: Generalized loss of muscle bulk of the left shoulder musculature. Biceps Long Head: Intraarticular and extraarticular portions of the biceps tendon are intact. Acromioclavicular Joint: Mild arthropathy of the acromioclavicular joint. No significant subacromial/subdeltoid bursal fluid . Glenohumeral Joint: No joint effusion. Mild chondrosis. Labrum: Degenerative posterior labral blunting. Bones: No evidence of acute fracture. There is no focal bone lesion. Other: No focal fluid collection.  No lymphadenopathy. IMPRESSION: No acute osseous abnormality or focal bone lesion. Thinning of the distal supraspinatus tendon without any edema signal, likely reflecting chronic partial tearing. No high-grade or retracted cuff tear. Generalized loss of muscle bulk of the left shoulder musculature. Mild glenohumeral and AC joint osteoarthritis. Electronically Signed   By: Maurine Simmering M.D.   On:  10/29/2022 12:31    ASSESSMENT AND PLAN:  This is a very pleasant 62 years old white female with extensive stage small cell lung cancer status post systemic chemotherapy with carbo platinum and etoposide for 6 cycles and the patient rotated her treatment well except for chemotherapy-induced anemia and requirement for PRBCs and platelet transfusion. She had significant improvement in her disease with the chemotherapy. She also had prophylactic cranial irradiation. She also underwent stereotactic radiotherapy to progressive right upper lobe pulmonary nodule. The patient has been in observation for close to 2 years. Repeat CT scan of the chest, abdomen and pelvis performed recently showed evidence for disease progression in the abdomen. The patient started on systemic chemotherapy again with carboplatin, etoposide and Tecentriq status post 71 cycles.  Starting from cycle #5 the patient is treated with maintenance single agent Tecentriq. She has been tolerating this treatment well with no concerning adverse effect except for mild fatigue. I recommended for her to proceed with cycle #72 today as planned. For the anxiety and pain management, she was referred to the palliative care team and she has an appointment with them today. For the history of a stroke she is currently on treatment with Plavix and aspirin and she is followed by neurology. I will see the patient back for follow-up visit in 3 weeks for evaluation before the next cycle of her treatment. She was advised to call immediately if she has any other concerning symptoms in the interval. All questions were answered. The patient knows to call the clinic with any problems, questions or concerns. We can certainly see the patient much sooner if necessary.  Disclaimer: This note was dictated with voice recognition software. Similar sounding words can inadvertently be transcribed and may not be corrected upon review.

## 2022-11-20 NOTE — Progress Notes (Signed)
Nutrition Follow-up:  Patient with extensive stage small cell lung cancer. She is currently receiving maintenance treatment with single agent Tecentriq 1200 mg IV every 3 weeks.     Met with patient in infusion. She is eating ham egg and cheese biscuit at visit. Patient reports good appetite. States she is eating everything in site. Patient is drinking Ensure when she has them. She denies nausea, vomiting, diarrhea. She has intermittent constipation. Patient does not drink water. She continues to drink several sodas. Patient reports she is not sleeping well at night due to "her nerves" She met with palliative care today. Patient appreciative of care provided.    Medications: reviewed   Labs: K 2.8, glucose 128  Anthropometrics: Wt 94.8 lbs today - stable  2/28 - 94 lb 8 oz 2/7 - 96 lb 11.2 oz   NUTRITION DIAGNOSIS: Unintended weight loss stable   INTERVENTION:  Encouraged high calorie high protein foods Drink 2 Ensure Plus/equivalent to promote weight gain One case Ensure Plus HP + bag of food from [employer] provided    MONITORING, EVALUATION, GOAL: weight trends, intake   NEXT VISIT: Thursday May 2 during infusion

## 2022-11-20 NOTE — Patient Instructions (Signed)
[facility]  Discharge Instructions: Thank you for choosing [facility] to provide your oncology and hematology care.   If you have a lab appointment with the Cancer Center, please go directly to the Cancer Center and check in at the registration area.   Wear comfortable clothing and clothing appropriate for easy access to any Portacath or PICC line.   We strive to give you quality time with your provider. You may need to reschedule your appointment if you arrive late (15 or more minutes).  Arriving late affects you and other patients whose appointments are after yours.  Also, if you miss three or more appointments without notifying the office, you may be dismissed from the clinic at the provider's discretion.      For prescription refill requests, have your pharmacy contact our office and allow 72 hours for refills to be completed.    Today you received the following chemotherapy and/or immunotherapy agents Tecentriq      To help prevent nausea and vomiting after your treatment, we encourage you to take your nausea medication as directed.  BELOW ARE SYMPTOMS THAT SHOULD BE REPORTED IMMEDIATELY: *FEVER GREATER THAN 100.4 F (38 C) OR HIGHER *CHILLS OR SWEATING *NAUSEA AND VOMITING THAT IS NOT CONTROLLED WITH YOUR NAUSEA MEDICATION *UNUSUAL SHORTNESS OF BREATH *UNUSUAL BRUISING OR BLEEDING *URINARY PROBLEMS (pain or burning when urinating, or frequent urination) *BOWEL PROBLEMS (unusual diarrhea, constipation, pain near the anus) TENDERNESS IN MOUTH AND THROAT WITH OR WITHOUT PRESENCE OF ULCERS (sore throat, sores in mouth, or a toothache) UNUSUAL RASH, SWELLING OR PAIN  UNUSUAL VAGINAL DISCHARGE OR ITCHING   Items with * indicate a potential emergency and should be followed up as soon as possible or go to the Emergency Department if any problems should occur.  Please show the CHEMOTHERAPY ALERT CARD or IMMUNOTHERAPY ALERT CARD at  check-in to the Emergency Department and triage nurse.  Should you have questions after your visit or need to cancel or reschedule your appointment, please contact [facility]  Dept: [phone]  and follow the prompts.  Office hours are 8:00 a.m. to 4:30 p.m. Monday - Friday. Please note that voicemails left after 4:00 p.m. may not be returned until the following business day.  We are closed weekends and major holidays. You have access to a nurse at all times for urgent questions. Please call the main number to the clinic Dept: [phone] and follow the prompts.   For any non-urgent questions, you may also contact your provider using MyChart. We now offer e-Visits for anyone 18 and older to request care online for non-urgent symptoms. For details visit [email or web address].   Also download the MyChart app! Go to the app store, search "MyChart", open the app, select [facility], and log in with your MyChart username and password. 

## 2022-11-20 NOTE — Progress Notes (Signed)
Patient seen by MD today  Vitals are within treatment parameters.  Labs reviewed: and are within treatment parameters.  Per physician team, patient is ready for treatment and there are NO modifications to the treatment plan.  

## 2022-11-21 ENCOUNTER — Telehealth: Payer: Self-pay | Admitting: Medical Oncology

## 2022-11-21 ENCOUNTER — Other Ambulatory Visit: Payer: Self-pay | Admitting: Physician Assistant

## 2022-11-21 DIAGNOSIS — E876 Hypokalemia: Secondary | ICD-10-CM

## 2022-11-21 MED ORDER — POTASSIUM CHLORIDE 20 MEQ/15ML (10%) PO SOLN: 20.0000 meq | Freq: Two times a day (BID) | ORAL | 0 refills | Status: DC

## 2022-11-21 NOTE — Telephone Encounter (Signed)
Records received from [facility].

## 2022-11-21 NOTE — Telephone Encounter (Signed)
Med records requested.  Spoke to pharmacist about changing  kclor tablets to liquid . She will call pt.

## 2022-11-24 ENCOUNTER — Other Ambulatory Visit: Payer: Self-pay | Admitting: Internal Medicine

## 2022-11-28 ENCOUNTER — Telehealth: Payer: Self-pay | Admitting: Internal Medicine

## 2022-11-28 NOTE — Telephone Encounter (Signed)
Called patient regarding upcoming April and May appointments, patient is notified.

## 2022-12-10 ENCOUNTER — Ambulatory Visit (INDEPENDENT_AMBULATORY_CARE_PROVIDER_SITE_OTHER): Payer: Medicare Other | Admitting: Podiatry

## 2022-12-10 VITALS — BP 137/79

## 2022-12-10 DIAGNOSIS — M79674 Pain in right toe(s): Secondary | ICD-10-CM

## 2022-12-10 DIAGNOSIS — M79675 Pain in left toe(s): Secondary | ICD-10-CM

## 2022-12-10 DIAGNOSIS — B351 Tinea unguium: Secondary | ICD-10-CM

## 2022-12-10 DIAGNOSIS — D689 Coagulation defect, unspecified: Secondary | ICD-10-CM | POA: Diagnosis not present

## 2022-12-10 NOTE — Progress Notes (Signed)
  Subjective:  Patient ID: Debra Kidd, female    DOB: 21-Jan-1961,  MRN: [id number]  Debra Kidd presents to clinic today for thick, elongated toenails bilateral great toes and bilateral 5th toes which are tender when wearing enclosed shoe gear.  Chief Complaint  Patient presents with   Nail Problem    [facility] PCP-does not have one   She states she dropped a can of [employer]eps[employer] on her right great toe. It is a little sore. She is still able to move the digit.  PCP is Pcp, No.  Allergies  Allergen Reactions   Codeine Nausea And Vomiting   Motrin [Ibuprofen]     Elevation of BP   [name]     intolerant   Vicodin [Hydrocodone-Acetaminophen] Nausea And Vomiting    Review of Systems: Negative except as noted in the HPI.  Objective: No changes noted in today's physical examination. Vitals:   12/10/22 1329  BP: 137/79   Debra Kidd is a pleasant 62 y.o. female WD, WN in NAD. AAO x 3.  Vascular Examination: Capillary refill time immediate b/l. Vascular status intact b/l with palpable pedal pulses. Pedal hair present b/l. No edema. No pain with calf compression b/l. Skin temperature gradient WNL b/l.   Neurological Examination: Sensation grossly intact b/l with 10 gram monofilament. Vibratory sensation intact b/l.   Dermatological Examination: Pedal skin with normal turgor, texture and tone b/l.  No open wounds. No interdigital macerations.   Toenails 1, 5 b/l thick, discolored, elongated with subungual debris and pain on dorsal palpation.   No hyperkeratotic nor porokeratotic lesions present on today's visit.  Musculoskeletal Examination: Normal muscle strength 5/5 to all lower extremity muscle groups bilaterally. No pain, crepitus or joint limitation noted with ROM b/l LE. No gross bony pedal deformities b/l. Patient ambulates independently without assistive aids.  Radiographs: None  Assessment/Plan: 1. Pain due to onychomycosis of toenails of both feet   2.  Blood clotting disorder    -Patient was evaluated and treated. All patient's and/or POA's questions/concerns answered on today's visit. -Patient to continue soft, supportive shoe gear daily. -Toenails bilateral great toes and bilateral 5th toes debrided in length and girth without iatrogenic bleeding with sterile nail nipper and dremel.  -Patient/POA to call should there be question/concern in the interim.   Return in about 3 months (around 03/11/2023).  Freddie Breech, DPM

## 2022-12-12 ENCOUNTER — Inpatient Hospital Stay (HOSPITAL_BASED_OUTPATIENT_CLINIC_OR_DEPARTMENT_OTHER): Payer: Medicare Other | Admitting: Internal Medicine

## 2022-12-12 ENCOUNTER — Inpatient Hospital Stay: Payer: Medicare Other

## 2022-12-12 ENCOUNTER — Encounter: Payer: Self-pay | Admitting: Medical Oncology

## 2022-12-12 ENCOUNTER — Other Ambulatory Visit: Payer: Self-pay

## 2022-12-12 ENCOUNTER — Inpatient Hospital Stay: Payer: Medicare Other | Attending: Internal Medicine

## 2022-12-12 ENCOUNTER — Inpatient Hospital Stay: Payer: Medicare Other | Admitting: Nurse Practitioner

## 2022-12-12 VITALS — BP 111/64 | HR 74 | Resp 18

## 2022-12-12 VITALS — BP 101/77 | HR 82 | Temp 98.3°F | Resp 16 | Wt 100.7 lb

## 2022-12-12 DIAGNOSIS — C3411 Malignant neoplasm of upper lobe, right bronchus or lung: Secondary | ICD-10-CM | POA: Insufficient documentation

## 2022-12-12 DIAGNOSIS — Z5112 Encounter for antineoplastic immunotherapy: Secondary | ICD-10-CM | POA: Insufficient documentation

## 2022-12-12 DIAGNOSIS — C3491 Malignant neoplasm of unspecified part of right bronchus or lung: Secondary | ICD-10-CM | POA: Diagnosis not present

## 2022-12-12 DIAGNOSIS — M810 Age-related osteoporosis without current pathological fracture: Secondary | ICD-10-CM | POA: Diagnosis not present

## 2022-12-12 DIAGNOSIS — Z8673 Personal history of transient ischemic attack (TIA), and cerebral infarction without residual deficits: Secondary | ICD-10-CM | POA: Insufficient documentation

## 2022-12-12 DIAGNOSIS — E785 Hyperlipidemia, unspecified: Secondary | ICD-10-CM | POA: Insufficient documentation

## 2022-12-12 DIAGNOSIS — I10 Essential (primary) hypertension: Secondary | ICD-10-CM | POA: Insufficient documentation

## 2022-12-12 DIAGNOSIS — C3490 Malignant neoplasm of unspecified part of unspecified bronchus or lung: Secondary | ICD-10-CM

## 2022-12-12 DIAGNOSIS — Z85828 Personal history of other malignant neoplasm of skin: Secondary | ICD-10-CM | POA: Insufficient documentation

## 2022-12-12 DIAGNOSIS — Z7902 Long term (current) use of antithrombotics/antiplatelets: Secondary | ICD-10-CM | POA: Diagnosis not present

## 2022-12-12 DIAGNOSIS — Z79899 Other long term (current) drug therapy: Secondary | ICD-10-CM | POA: Diagnosis not present

## 2022-12-12 LAB — CBC WITH DIFFERENTIAL (CANCER CENTER ONLY)
Abs Immature Granulocytes: 0.02 10*3/uL (ref 0.00–0.07)
Basophils Absolute: 0 10*3/uL (ref 0.0–0.1)
Basophils Relative: 0 %
Eosinophils Absolute: 0.2 10*3/uL (ref 0.0–0.5)
Eosinophils Relative: 3 %
HCT: 32.2 % — ABNORMAL LOW (ref 36.0–46.0)
Hemoglobin: 11.2 g/dL — ABNORMAL LOW (ref 12.0–15.0)
Immature Granulocytes: 0 %
Lymphocytes Relative: 24 %
Lymphs Abs: 1.4 10*3/uL (ref 0.7–4.0)
MCH: 37.6 pg — ABNORMAL HIGH (ref 26.0–34.0)
MCHC: 34.8 g/dL (ref 30.0–36.0)
MCV: 108.1 fL — ABNORMAL HIGH (ref 80.0–100.0)
Monocytes Absolute: 0.5 10*3/uL (ref 0.1–1.0)
Monocytes Relative: 8 %
Neutro Abs: 3.7 10*3/uL (ref 1.7–7.7)
Neutrophils Relative %: 65 %
Platelet Count: 261 10*3/uL (ref 150–400)
RBC: 2.98 MIL/uL — ABNORMAL LOW (ref 3.87–5.11)
RDW: 12.6 % (ref 11.5–15.5)
WBC Count: 5.8 10*3/uL (ref 4.0–10.5)
nRBC: 0 % (ref 0.0–0.2)

## 2022-12-12 LAB — CMP (CANCER CENTER ONLY)
ALT: 11 U/L (ref 0–44)
AST: 16 U/L (ref 15–41)
Albumin: 3.4 g/dL — ABNORMAL LOW (ref 3.5–5.0)
Alkaline Phosphatase: 98 U/L (ref 38–126)
Anion gap: 4 — ABNORMAL LOW (ref 5–15)
BUN: 6 mg/dL — ABNORMAL LOW (ref 8–23)
CO2: 29 mmol/L (ref 22–32)
Calcium: 8.5 mg/dL — ABNORMAL LOW (ref 8.9–10.3)
Chloride: 105 mmol/L (ref 98–111)
Creatinine: 0.64 mg/dL (ref 0.44–1.00)
GFR, Estimated: >60 mL/min (ref >60)
Glucose, Bld: 92 mg/dL (ref 70–99)
Potassium: 3.5 mmol/L (ref 3.5–5.1)
Sodium: 138 mmol/L (ref 135–145)
Total Bilirubin: 0.6 mg/dL (ref 0.3–1.2)
Total Protein: 5.9 g/dL — ABNORMAL LOW (ref 6.5–8.1)

## 2022-12-12 MED ORDER — SODIUM CHLORIDE 0.9 % IV SOLN: Freq: Once | INTRAVENOUS | Status: AC

## 2022-12-12 MED ORDER — HEPARIN SOD (PORK) LOCK FLUSH 100 UNIT/ML IV SOLN: 500.0000 [IU] | Freq: Once | INTRAVENOUS | Status: DC | PRN

## 2022-12-12 MED ORDER — SODIUM CHLORIDE 0.9 % IV SOLN
1200.0000 mg | Freq: Once | INTRAVENOUS | Status: AC
  Administered 2022-12-12: 1200 mg via INTRAVENOUS
  Filled 2022-12-12: qty 20

## 2022-12-12 MED ORDER — SODIUM CHLORIDE 0.9% FLUSH: 10.0000 mL | INTRAVENOUS | Status: DC | PRN

## 2022-12-12 NOTE — Progress Notes (Signed)
Patient seen by Dr. Mohamed  Vitals are within treatment parameters.  Labs reviewed: and are within treatment parameters.  Per physician team, patient is ready for treatment and there are NO modifications to the treatment plan.  

## 2022-12-12 NOTE — Progress Notes (Signed)
Patient seen by Dr. Gypsy Balsam are within treatment parameters.  Labs reviewed: CBC w diff is within parameters. CMP pending.

## 2022-12-12 NOTE — Progress Notes (Signed)
Palliative Medicine [facility]  Telephone:(336) [phone] Fax:(336) [phone]   Name: Debra Kidd Date: 12/12/2022 MRN: [id number]  DOB: [birth year]  Patient Care Team: Pcp, No as PCP - General   I connected with Debra Kidd on 12/12/22 at  2:45 PM EDT by phone and verified that I am speaking with the correct person using two identifiers.   I discussed the limitations, risks, security and privacy concerns of performing an evaluation and management service by telemedicine and the availability of in-person appointments. I also discussed with the patient that there may be a patient responsible charge related to this service. The patient expressed understanding and agreed to proceed.   Other persons participating in the visit and their role in the encounter: n/a   Patient's location: home  Provider's location: [facility]   Chief Complaint: f/u of symptom management   INTERVAL HISTORY: Debra Kidd is a 62 y.o. 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  Palliative ask to see for symptom management and goals of care.   SOCIAL HISTORY:     reports that she has been smoking cigarettes. She has a 86.00 pack-year smoking history. She has never used smokeless tobacco. She reports that she does not currently use alcohol. She reports current drug use. Frequency: 7.00 times per week. Drug: Marijuana.  ADVANCE DIRECTIVES:    CODE STATUS:   PAST MEDICAL HISTORY: Past Medical History:  Diagnosis Date   Basal cell carcinoma of cheek    L side of face   Blood type, Rh positive    Cancer (HCC)    Chronic pain syndrome    Depression 08/07/2016   Encounter for antineoplastic chemotherapy 07/17/2016   History of external beam radiation therapy 11/19/16-12/02/16   brain 25 Gy in 10  fractions   Hyperlipidemia    Hypertension    Hypertension 08/07/2016   Osteoporosis    Small cell lung cancer (HCC) dx'd 05/2016    ALLERGIES:  is allergic to codeine, motrin [ibuprofen], thiazide-type diuretics, and vicodin [hydrocodone-acetaminophen].  MEDICATIONS:  Current Outpatient Medications  Medication Sig Dispense Refill   atorvastatin (LIPITOR) 80 MG tablet Take 1 tablet (80 mg total) by mouth daily. (Patient not taking: Reported on 11/20/2022) 30 tablet 11   clopidogrel (PLAVIX) 75 MG tablet Take 1 tablet (75 mg total) by mouth daily. (Patient not taking: Reported on 11/20/2022) 90 tablet 3   diazepam (VALIUM) 5 MG tablet Take 1 tablet (5 mg total) by mouth every 12 (twelve) hours as needed for anxiety. 30 tablet 2   lidocaine-prilocaine (EMLA) cream Apply 1 Application topically as needed. 30 g 2   oxyCODONE-acetaminophen (PERCOCET/ROXICET) 5-325 MG tablet Take 1 tablet by mouth every 6 (six) hours as needed for severe pain. 60 tablet 0   potassium chloride 20 MEQ/15ML (10%) SOLN Take 15 mLs (20 mEq total) by mouth 2 (two) times daily. 300 mL 0   Vitamin D, Ergocalciferol, [name],000 Units by mouth 3 (three) times a week.     No current facility-administered medications for this visit.    VITAL SIGNS: There were no vitals taken for this visit. There were no vitals filed for this visit.  Estimated body mass index is 16.74 kg/m as calculated from the following:   Height as of 10/09/22: 5'  3" (1.6 m).   Weight as of 11/20/22: 94 lb 8 oz (42.9 kg).   PERFORMANCE STATUS (ECOG) : 1 - Symptomatic but completely ambulatory   Physical Exam General: NAD Cardiovascular: regular rate and rhythm Pulmonary: clear ant fields Abdomen: soft, nontender, + bowel sounds Extremities: no edema, no joint deformities Skin: no rashes Neurological:   IMPRESSION:   Chronic pain Greer endorses ongoing chest/rib pain, back pain, and bilateral leg  pain.  Her pain sometimes interrupts her sleep and due to severity.  She also endorses bilateral lower extremity neuropathy.  She describes pain as sharp, stabbing, aching, throbbing.  Reports nothing makes pain completely better however some improvement with medication.  Pain worsens with increased activity.   We reviewed patient's current regimen.  She is taking hydrocodone 5/325 as needed.  She rates her pain at worst 8 out of 10.  Reports pain decreases to 3-4 out of 10 with medication.   Detailed chart review performed.  Patient was previously being seen by [facility] medical pain clinic where she was receiving hydrocodone and Valium.  All the recent drug UDS have been negative.  Extensive medication, physical, and psychosocial assessment completed.  Patient denies use of illicit drugs however does endorse occasional use for marijuana which she feels helps her appetite.   Detailed education provided on the role of palliative in her pain management.  I reviewed at length patient's responsibility and expectations to allow for safe medication management and ongoing support.  Pain contract thoroughly reviewed and patient willingly signed.  She understands any violations of the contract can and will result in automatic dismissal and cease of medication prescriber.   Will continue patient on hydrocodone as previously prescribed.  Will continue to closely monitor.  Could potentially consider long-acting medication.   Constipation     Anxiety/Insomnia   We discussed Her current illness and what it means in the larger context of Her on-going co-morbidities. Natural disease trajectory and expectations were discussed.  I discussed the importance of continued conversation with family and their medical providers regarding overall plan of care and treatment options, ensuring decisions are within the context of the patients values and GOCs.  PLAN: Established therapeutic relationship. Education provided on  palliative's role in collaboration with their Oncology/Radiation team. Oxycodone 5/325 1 tablet every 6 hours as needed for pain Valium 5 mg 1 tablet every 12 hours as needed for anxiety/insomnia Pain contract completed. Senna S daily for bowel regimen I will plan to see patient back in 2-4 weeks in collaboration to other oncology appointments   Patient expressed understanding and was in agreement with this plan. She also understands that She can call the clinic at any time with any questions, concerns, or complaints.   Any controlled substances utilized were prescribed in the context of palliative care. PDMP has been reviewed.    Visit consisted of counseling and education dealing with the complex and emotionally intense issues of symptom management and palliative care in the setting of serious and potentially life-threatening illness.Greater than 50%  of this time was spent counseling and coordinating care related to the above assessment and plan.  [name], AGPCNP-BC  Palliative Medicine Team/[facility]  *Please note that this is a verbal dictation therefore any spelling or grammatical errors are due to the "Dragon Medical One" system interpretation.

## 2022-12-12 NOTE — Patient Instructions (Signed)
[facility]  Discharge Instructions: Thank you for choosing [facility] to provide your oncology and hematology care.   If you have a lab appointment with the Cancer Center, please go directly to the Cancer Center and check in at the registration area.   Wear comfortable clothing and clothing appropriate for easy access to any Portacath or PICC line.   We strive to give you quality time with your provider. You may need to reschedule your appointment if you arrive late (15 or more minutes).  Arriving late affects you and other patients whose appointments are after yours.  Also, if you miss three or more appointments without notifying the office, you may be dismissed from the clinic at the provider's discretion.      For prescription refill requests, have your pharmacy contact our office and allow 72 hours for refills to be completed.    Today you received the following chemotherapy and/or immunotherapy agents Tecentriq      To help prevent nausea and vomiting after your treatment, we encourage you to take your nausea medication as directed.  BELOW ARE SYMPTOMS THAT SHOULD BE REPORTED IMMEDIATELY: *FEVER GREATER THAN 100.4 F (38 C) OR HIGHER *CHILLS OR SWEATING *NAUSEA AND VOMITING THAT IS NOT CONTROLLED WITH YOUR NAUSEA MEDICATION *UNUSUAL SHORTNESS OF BREATH *UNUSUAL BRUISING OR BLEEDING *URINARY PROBLEMS (pain or burning when urinating, or frequent urination) *BOWEL PROBLEMS (unusual diarrhea, constipation, pain near the anus) TENDERNESS IN MOUTH AND THROAT WITH OR WITHOUT PRESENCE OF ULCERS (sore throat, sores in mouth, or a toothache) UNUSUAL RASH, SWELLING OR PAIN  UNUSUAL VAGINAL DISCHARGE OR ITCHING   Items with * indicate a potential emergency and should be followed up as soon as possible or go to the Emergency Department if any problems should occur.  Please show the CHEMOTHERAPY ALERT CARD or IMMUNOTHERAPY ALERT CARD at  check-in to the Emergency Department and triage nurse.  Should you have questions after your visit or need to cancel or reschedule your appointment, please contact [facility]  Dept: [phone]  and follow the prompts.  Office hours are 8:00 a.m. to 4:30 p.m. Monday - Friday. Please note that voicemails left after 4:00 p.m. may not be returned until the following business day.  We are closed weekends and major holidays. You have access to a nurse at all times for urgent questions. Please call the main number to the clinic Dept: [phone] and follow the prompts.   For any non-urgent questions, you may also contact your provider using MyChart. We now offer e-Visits for anyone 18 and older to request care online for non-urgent symptoms. For details visit [email or web address].   Also download the MyChart app! Go to the app store, search "MyChart", open the app, select [facility], and log in with your MyChart username and password. 

## 2022-12-12 NOTE — Progress Notes (Signed)
[facility] Telephone:(336) [phone]   Fax:(336) [phone]  OFFICE PROGRESS NOTE  Pcp, [name], N3, [name], large right anterior mediastinal and supraclavicular lymphadenopathy as well as pancreatic and splenic metastasis diagnosed in September 2017.  The patient had disease progression in October 2019.  PRIOR THERAPY:  1) Systemic chemotherapy was carboplatin for AUC of 5 on day 1 and etoposide 100 MG/M2 on days 1, 2 and 3 with Neulasta support. Status post 6 cycles with significant response of her disease. 2) Prophylactic cranial irradiation under the care of Dr. Roselind MessierKinard on 12/02/2016. 3) stereotactic radiotherapy to the recurrent right upper lobe pulmonary nodule under the care of Dr. Roselind MessierKinard completed November 10, 2017. 4) Retreatment with systemic chemotherapy with carboplatin for AUC of 5 on day 1 and etoposide 100 mg/M2 on days 1, 2 and 3 as well as Tecentriq (Atezolizumab) 1200 mg IV every 3 weeks with Neulasta support.  First dose June 22, 2018 for disease recurrence.  Status post 5 cycles.  Starting from cycle #2 her dose of carboplatin will be reduced to AUC of 4 and etoposide 80 mg/M2 on days 1, 2 and 3 in addition to the regular dose of Tecentriq.  CURRENT THERAPY: Maintenance treatment with single agent Tecentriq 1200 mg IV every 3 weeks.  Status post 66 cycles.  INTERVAL HISTORY: Debra Kidd 62 y.o. female returns to the clinic today for follow-up visit.  The patient is feeling fine today with no concerning complaints except for aching pains.  She denied having any current chest pain, shortness of breath, cough or hemoptysis.  She has no nausea, vomiting, diarrhea or constipation.  She has no headache or visual changes.  She denied having any recent weight loss or night sweats.  She continues to tolerate her treatment with immunotherapy fairly well.  She  is here today for evaluation before starting cycle #73 of her treatment.  MEDICAL HISTORY: Past Medical History:  Diagnosis Date   Basal cell carcinoma of cheek    L side of face   Blood type, Rh positive    Cancer (HCC)    Chronic pain syndrome    Depression 08/07/2016   Encounter for antineoplastic chemotherapy 07/17/2016   History of external beam radiation therapy 11/19/16-12/02/16   brain 25 Gy in 10 fractions   Hyperlipidemia    Hypertension    Hypertension 08/07/2016   Osteoporosis    Small cell lung cancer (HCC) dx'd 05/2016    ALLERGIES:  is allergic to codeine, motrin [ibuprofen], thiazide-type diuretics, and vicodin [hydrocodone-acetaminophen].  MEDICATIONS:  Current Outpatient Medications  Medication Sig Dispense Refill   atorvastatin (LIPITOR) 80 MG tablet Take 1 tablet (80 mg total) by mouth daily. (Patient not taking: Reported on 11/20/2022) 30 tablet 11   clopidogrel (PLAVIX) 75 MG tablet Take 1 tablet (75 mg total) by mouth daily. (Patient not taking: Reported on 11/20/2022) 90 tablet 3   diazepam (VALIUM) 5 MG tablet Take 1 tablet (5 mg total) by mouth every 12 (twelve) hours as needed for anxiety. 30 tablet 2   lidocaine-prilocaine (EMLA) cream Apply 1 Application topically as needed. 30 g 2   oxyCODONE-acetaminophen (PERCOCET/ROXICET) 5-325 MG tablet Take 1 tablet by mouth every 6 (six) hours as needed for severe pain. 60 tablet 0   potassium chloride 20 MEQ/15ML (10%) SOLN Take 15 mLs (20 mEq total) by mouth 2 (  two) times daily. 300 mL 0   Vitamin D, Ergocalciferol, [name],000 Units by mouth 3 (three) times a week.     No current facility-administered medications for this visit.    SURGICAL HISTORY:  Past Surgical History:  Procedure Laterality Date   ABDOMINAL HYSTERECTOMY  1981   APPENDECTOMY     at age 22   EYE SURGERY     left   IR GENERIC HISTORICAL  06/03/2016   IR FLUORO GUIDE PORT INSERTION RIGHT 06/03/2016  [facility]-INTERV RAD   IR GENERIC HISTORICAL  06/03/2016   IR US GUIDE VASC ACCESS RIGHT 06/03/2016 [facility]-INTERV RAD   SKIN CANCER EXCISION     basal cell carcinoma L side of face    REVIEW OF SYSTEMS:  A comprehensive review of systems was negative except for: Constitutional: positive for fatigue Musculoskeletal: positive for arthralgias Behavioral/Psych: positive for anxiety   PHYSICAL EXAMINATION: General appearance: alert, cooperative, fatigued, and no distress Head: Normocephalic, without obvious abnormality, atraumatic Neck: no adenopathy, no JVD, supple, symmetrical, trachea midline, and thyroid not enlarged, symmetric, no tenderness/mass/nodules Lymph nodes: Cervical, supraclavicular, and axillary nodes normal. Resp: clear to auscultation bilaterally Back: symmetric, no curvature. ROM normal. No CVA tenderness. Cardio: regular rate and rhythm, S1, S2 normal, no murmur, click, rub or gallop GI: soft, non-tender; bowel sounds normal; no masses,  no organomegaly Extremities: extremities normal, atraumatic, no cyanosis or edema  ECOG PERFORMANCE STATUS: 1 - Symptomatic but completely ambulatory  Blood pressure 101/77, pulse 82, temperature 98.3 F (36.8 C), temperature source Oral, resp. rate 16, weight 100 lb 11.2 oz (45.7 kg), SpO2 99 %.  LABORATORY DATA: Lab Results  Component Value Date   WBC 5.5 11/20/2022   HGB 11.6 (L) 11/20/2022   HCT 32.1 (L) 11/20/2022   MCV 106.3 (H) 11/20/2022   PLT 224 11/20/2022      Chemistry      Component Value Date/Time   NA 135 11/20/2022 1026   NA 141 08/01/2017 0835   K 2.8 (L) 11/20/2022 1026   K 3.5 08/01/2017 0835   CL 101 11/20/2022 1026   CO2 26 11/20/2022 1026   CO2 25 08/01/2017 0835   BUN 6 (L) 11/20/2022 1026   BUN 8.0 08/01/2017 0835   CREATININE 0.65 11/20/2022 1026   CREATININE 0.9 08/01/2017 0835      Component Value Date/Time   CALCIUM 8.6 (L) 11/20/2022 1026   CALCIUM 9.9 08/01/2017 0835   ALKPHOS 102 11/20/2022 1026    ALKPHOS 142 08/01/2017 0835   AST 16 11/20/2022 1026   AST 15 08/01/2017 0835   ALT 10 11/20/2022 1026   ALT 19 08/01/2017 0835   BILITOT 0.6 11/20/2022 1026   BILITOT 0.35 08/01/2017 0835       RADIOGRAPHIC STUDIES: No results found.  ASSESSMENT AND PLAN:  This is a very pleasant 62 years old white female with extensive stage small cell lung cancer status post systemic chemotherapy with carbo platinum and etoposide for 6 cycles and the patient rotated her treatment well except for chemotherapy-induced anemia and requirement for PRBCs and platelet transfusion. She had significant improvement in her disease with the chemotherapy. She also had prophylactic cranial irradiation. She also underwent stereotactic radiotherapy to progressive right upper lobe pulmonary nodule. The patient has been in observation for close to 2 years. Repeat CT scan of the chest, abdomen and pelvis performed recently showed evidence for disease progression in the abdomen. The patient started on systemic chemotherapy  again with carboplatin, etoposide and Tecentriq status post 72 cycles.  Starting from cycle #5 the patient is treated with maintenance single agent Tecentriq. The patient has been tolerating this treatment well with no concerning adverse effects. I recommended for her to proceed with cycle #73 today as planned. I will see her back for follow-up visit in 3 weeks for evaluation with repeat CT scan of the chest, abdomen and pelvis for restaging of her disease. The patient was advised to call immediately if she has any concerning symptoms in the interval. For the anxiety and pain management, she was referred to the palliative care team and she has an appointment with them today. For the history of a stroke she is currently on treatment with Plavix and aspirin and she is followed by neurology.  All questions were answered. The patient knows to call the clinic with any problems, questions or concerns. We  can certainly see the patient much sooner if necessary.  Disclaimer: This note was dictated with voice recognition software. Similar sounding words can inadvertently be transcribed and may not be corrected upon review.

## 2022-12-13 ENCOUNTER — Inpatient Hospital Stay (HOSPITAL_BASED_OUTPATIENT_CLINIC_OR_DEPARTMENT_OTHER): Payer: Medicare Other | Admitting: Nurse Practitioner

## 2022-12-13 ENCOUNTER — Other Ambulatory Visit: Payer: Self-pay | Admitting: Nurse Practitioner

## 2022-12-13 DIAGNOSIS — K5903 Drug induced constipation: Secondary | ICD-10-CM

## 2022-12-13 DIAGNOSIS — G893 Neoplasm related pain (acute) (chronic): Secondary | ICD-10-CM

## 2022-12-13 DIAGNOSIS — C3491 Malignant neoplasm of unspecified part of right bronchus or lung: Secondary | ICD-10-CM

## 2022-12-13 DIAGNOSIS — F419 Anxiety disorder, unspecified: Secondary | ICD-10-CM | POA: Diagnosis not present

## 2022-12-13 DIAGNOSIS — Z515 Encounter for palliative care: Secondary | ICD-10-CM | POA: Diagnosis not present

## 2022-12-13 DIAGNOSIS — R530 Neoplastic (malignant) related fatigue: Secondary | ICD-10-CM

## 2022-12-13 MED ORDER — OXYCODONE-ACETAMINOPHEN 5-325 MG PO TABS
1.0000 | ORAL_TABLET | Freq: Four times a day (QID) | ORAL | 0 refills | Status: DC | PRN
Start: 2022-12-13 — End: 2023-01-02

## 2022-12-13 MED ORDER — DIAZEPAM 5 MG PO TABS
5.0000 mg | ORAL_TABLET | Freq: Two times a day (BID) | ORAL | 0 refills | Status: DC | PRN
Start: 2022-12-13 — End: 2023-01-02

## 2022-12-13 NOTE — Progress Notes (Signed)
Palliative Medicine [facility]  Telephone:(336) [phone] Fax:(336) [phone]   Name: Debra Kidd Date: 12/13/2022 MRN: [id number]  DOB: 1960-10-22  Patient Care Team: Pcp, No as PCP - General   I connected with Debra Kidd on 12/13/22 at 10:30 AM EDT by phone and verified that I am speaking with the correct person using two identifiers.   I discussed the limitations, risks, security and privacy concerns of performing an evaluation and management service by telemedicine and the availability of in-person appointments. I also discussed with the patient that there may be a patient responsible charge related to this service. The patient expressed understanding and agreed to proceed.   Other persons participating in the visit and their role in the encounter: n/a   Patient's location: home  Provider's location: [facility]   Chief Complaint: f/u of symptom management   INTERVAL HISTORY: Debra Kidd is a 62 y.o. 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  Palliative ask to see for symptom management and goals of care.   SOCIAL HISTORY:     reports that she has been smoking cigarettes. She has a 86.00 pack-year smoking history. She has never used smokeless tobacco. She reports that she does not currently use alcohol. She reports current drug use. Frequency: 7.00 times per week. Drug: Marijuana.  ADVANCE DIRECTIVES:    CODE STATUS:   PAST MEDICAL HISTORY: Past Medical History:  Diagnosis Date   Basal cell carcinoma of cheek    L side of face   Blood type, Rh positive    Cancer    Chronic pain syndrome    Depression 08/07/2016   Encounter for antineoplastic chemotherapy 07/17/2016   History of external beam radiation therapy 11/19/16-12/02/16   brain 25 Gy in 10 fractions    Hyperlipidemia    Hypertension    Hypertension 08/07/2016   Osteoporosis    Small cell lung cancer dx'd 05/2016    ALLERGIES:  is allergic to codeine, motrin [ibuprofen], thiazide-type diuretics, and vicodin [hydrocodone-acetaminophen].  MEDICATIONS:  Current Outpatient Medications  Medication Sig Dispense Refill   atorvastatin (LIPITOR) 80 MG tablet Take 1 tablet (80 mg total) by mouth daily. (Patient not taking: Reported on 11/20/2022) 30 tablet 11   clopidogrel (PLAVIX) 75 MG tablet Take 1 tablet (75 mg total) by mouth daily. (Patient not taking: Reported on 11/20/2022) 90 tablet 3   diazepam (VALIUM) 5 MG tablet Take 1 tablet (5 mg total) by mouth every 12 (twelve) hours as needed for anxiety. 30 tablet 2   lidocaine-prilocaine (EMLA) cream Apply 1 Application topically as needed. 30 g 2   oxyCODONE-acetaminophen (PERCOCET/ROXICET) 5-325 MG tablet Take 1 tablet by mouth every 6 (six) hours as needed for severe pain. 60 tablet 0   potassium chloride 20 MEQ/15ML (10%) SOLN Take 15 mLs (20 mEq total) by mouth 2 (two) times daily. 300 mL 0   Vitamin D, Ergocalciferol, [name],000 Units by mouth 3 (three) times a week.     No current facility-administered medications for this visit.    VITAL SIGNS: There were no vitals taken for this visit. There were no vitals filed for this visit.  Estimated body mass index is 17.84 kg/m as calculated from the following:   Height as of 10/09/22: 5\' 3"  (1.6 m).  Weight as of 12/12/22: 100 lb 11.2 oz (45.7 kg).   PERFORMANCE STATUS (ECOG) : 1 - Symptomatic but completely ambulatory   IMPRESSION: I connected by phone with Debra Kidd. No acute distress noted. Treatment on yesterday. Feeling ok today. Is taking things one day at a time.   Chronic pain Debra Kidd endorses pain being well controlled. Some days are better than others however. Reports nothing makes pain completely better however some improvement with  medication.  Pain worsens with increased activity.   We reviewed patient's current regimen.  She is taking hydrocodone 5/325 as needed.    Will continue patient on hydrocodone as previously prescribed.  Will continue to closely monitor.  Could potentially consider long-acting medication if pain is not well controlled. No adjustments at this time.    Constipation Controlled with daily regimen.    Anxiety/Insomnia Endorses anxiety and nervousness. We discussed use of Valium. She is taking at minimum once daily. Discussed potential to take twice daily as needed. She verbalized understanding.   I discussed the importance of continued conversation with family and their medical providers regarding overall plan of care and treatment options, ensuring decisions are within the context of the patients values and GOCs.  PLAN:  Oxycodone 5/325 1 tablet every 6 hours as needed for pain Valium 5 mg 1 tablet every 12 hours as needed for anxiety/insomnia Pain contract completed. Senna S daily for bowel regimen I will plan to see patient back in 2-4 weeks in collaboration to other oncology appointments   Patient expressed understanding and was in agreement with this plan. She also understands that She can call the clinic at any time with any questions, concerns, or complaints.   Any controlled substances utilized were prescribed in the context of palliative care. PDMP has been reviewed.    Visit consisted of counseling and education dealing with the complex and emotionally intense issues of symptom management and palliative care in the setting of serious and potentially life-threatening illness.Greater than 50%  of this time was spent counseling and coordinating care related to the above assessment and plan.  [name], AGPCNP-BC  Palliative Medicine Team/[facility]  *Please note that this is a verbal dictation therefore any spelling or grammatical errors are due to the  "Dragon Medical One" system interpretation.

## 2022-12-14 ENCOUNTER — Encounter: Payer: Self-pay | Admitting: Podiatry

## 2022-12-19 ENCOUNTER — Telehealth: Payer: Self-pay | Admitting: Medical Oncology

## 2022-12-19 NOTE — Telephone Encounter (Signed)
[employer] is faxing a PA for a lumbar orthotic.

## 2022-12-23 ENCOUNTER — Encounter: Payer: Self-pay | Admitting: Internal Medicine

## 2022-12-23 ENCOUNTER — Telehealth: Payer: Self-pay | Admitting: Medical Oncology

## 2022-12-23 ENCOUNTER — Telehealth: Payer: Self-pay | Admitting: Orthopedic Surgery

## 2022-12-23 NOTE — Telephone Encounter (Signed)
Request PA for Back orthotic. I referred them to Dr Earl Lites s. August Saucer.

## 2022-12-23 NOTE — Telephone Encounter (Signed)
PA need for a Lumbar Ortho back brace. Please advise. [phone]( [facility])

## 2022-12-26 NOTE — Telephone Encounter (Signed)
Not seen before for her back to my knowledge

## 2022-12-30 NOTE — Progress Notes (Signed)
[facility] OFFICE PROGRESS NOTE  Pcp, [name], N3, [name], large right anterior mediastinal and supraclavicular lymphadenopathy as well as pancreatic and splenic metastasis diagnosed in September 2017.  The patient had disease progression in October 2019   PRIOR THERAPY:  1) Systemic chemotherapy was carboplatin for AUC of 5 on day 1 and etoposide 100 MG/M2 on days 1, 2 and 3 with Neulasta support. Status post 6 cycles with significant response of her disease. 2) Prophylactic cranial irradiation under the care of Dr. Roselind Messier on 12/02/2016. 3) stereotactic radiotherapy to the recurrent right upper lobe pulmonary nodule under the care of Dr. Roselind Messier completed November 10, 2017. 4) Retreatment with systemic chemotherapy with carboplatin for AUC of 5 on day 1 and etoposide 100 mg/M2 on days 1, 2 and 3 as well as Tecentriq (Atezolizumab) 1200 mg IV every 3 weeks with Neulasta support.  First dose June 22, 2018 for disease recurrence.  Status post 5 cycles.  Starting from cycle #2 her dose of carboplatin will be reduced to AUC of 4 and etoposide 80 mg/M2 on days 1, 2 and 3 in addition to the regular dose of Tecentriq.   CURRENT THERAPY: Maintenance treatment with single agent Tecentriq 1200 mg IV every 3 weeks.  Status post 72 cycles.    INTERVAL HISTORY: Debra Kidd 62 y.o. female returns to the clinic today for a follow-up visit.  The patient is currently being followed for metastatic small cell lung cancer for several years for which she has been on maintenance Tecentriq IV every 3 weeks.  She does tolerate this well except for dry skin and itching. She uses hydrocortisone cream. Overall though, she is not in good health and is very deconditioned at baseline due to comorbidities and poor nutrition and not being very active. I referred her to PCPs several times in the past but  these referrals have been closed after multiple unsuccessful attempts to reach the patient. She was last seen by Dr. Arbutus Ped 3 weeks ago. The patient reports baseline persistent fatigue and generalized weakness today. She dropped a shampoo bottle on her foot yesterday so she states her foot is sore. She is followed closely by social work and nutrition.  She is supposed to see nutrition today. She also sees palliative care for management of pain and anxiety. She is scheduled to see palliative care today. She often receives complementary food items and supplemental protein drinks in our clinic to take home.     She denies any fever, chills, or night sweats.   She denies any nausea or vomiting unless she takes medication on an empty stomach. She had some constipation recently and took Senakot which subsequently caused diarrhea.  She reports her baseline dyspnea on exertion which is stable without any chest pain or hemoptysis.  She reports a baseline mild cough and she continues to smoke approximately 2 packs of cigarettes per day. She recently had a restaging CT scan performed.  She is here today for evaluation and to review her scan before undergoing cycle #73.    MEDICAL HISTORY: Past Medical History:  Diagnosis Date   Basal cell carcinoma of cheek    L side of face   Blood type, Rh positive    Cancer (HCC)    Chronic pain syndrome    Depression 08/07/2016   Encounter for antineoplastic chemotherapy 07/17/2016   History of external  beam radiation therapy 11/19/16-12/02/16   brain 25 Gy in 10 fractions   Hyperlipidemia    Hypertension    Hypertension 08/07/2016   Osteoporosis    Small cell lung cancer (HCC) dx'd 05/2016    ALLERGIES:  is allergic to codeine, motrin [ibuprofen], thiazide-type diuretics, and vicodin [hydrocodone-acetaminophen].  MEDICATIONS:  Current Outpatient Medications  Medication Sig Dispense Refill   atorvastatin (LIPITOR) 80 MG tablet Take 1 tablet (80 mg total) by mouth  daily. 30 tablet 11   clopidogrel (PLAVIX) 75 MG tablet Take 1 tablet (75 mg total) by mouth daily. 90 tablet 3   diazepam (VALIUM) 5 MG tablet Take 1 tablet (5 mg total) by mouth every 12 (twelve) hours as needed for anxiety. 45 tablet 0   lidocaine-prilocaine (EMLA) cream Apply 1 Application topically as needed. 30 g 2   oxyCODONE-acetaminophen (PERCOCET/ROXICET) 5-325 MG tablet Take 1 tablet by mouth every 6 (six) hours as needed for severe pain. 60 tablet 0   potassium chloride 20 MEQ/15ML (10%) SOLN Take 15 mLs (20 mEq total) by mouth 2 (two) times daily. 300 mL 0   Vitamin D, Ergocalciferol, [name],000 Units by mouth 3 (three) times a week.     No current facility-administered medications for this visit.    SURGICAL HISTORY:  Past Surgical History:  Procedure Laterality Date   ABDOMINAL HYSTERECTOMY  1981   APPENDECTOMY     at age 61   EYE SURGERY     left   IR GENERIC HISTORICAL  06/03/2016   IR FLUORO GUIDE PORT INSERTION RIGHT 06/03/2016 [facility]   IR GENERIC HISTORICAL  06/03/2016   IR US GUIDE VASC ACCESS RIGHT 06/03/2016 [facility]   SKIN CANCER EXCISION     basal cell carcinoma L side of face    REVIEW OF SYSTEMS:   Review of Systems  Constitutional: Positive for stable decreased appetite and fatigue. Negative for  chills, fever and unexpected weight change.  HENT: Negative for mouth sores, nosebleeds, sore throat and trouble swallowing.  Eyes: Negative for eye problems and icterus.  Respiratory: Positive for stable SOB and mild cough which are her baseline. Negative for hemoptysis, and wheezing.   Cardiovascular: Negative for chest pain and leg swelling.  Gastrointestinal:  Negative for abdominal pain, recent diarrhea, constipation,  nausea and vomiting.  Genitourinary: Negative for bladder incontinence, difficulty urinating, dysuria, frequency and hematuria.   Musculoskeletal: Positive for chronic back pain.  Positive  for improved left upper extremity pain.  Negative for gait problem, neck pain and neck stiffness.  Skin: Positive for occasional itching. Negative for rash.  Neurological: Positive for generalized weakness.  Positive for left-sided weakness due to her history of stroke.  Negative for extremity weakness, gait problem, [name], depression and sleep disturbance. The patient is not nervous/anxious   PHYSICAL EXAMINATION:  Blood pressure 112/77, pulse 72, temperature 98.2 F (36.8 C), temperature source Oral, resp. rate 16, SpO2 100 %.  ECOG PERFORMANCE STATUS: 2-3  Physical Exam  Constitutional: Oriented to person, place, and time and chronically ill appearing female appears older than stated age and in no distress. HENT: Head: Normocephalic and atraumatic. Mouth/Throat: Oropharynx is clear and moist. No oropharyngeal exudate. Eyes: Conjunctivae are normal. Right eye exhibits no discharge. Left eye exhibits no discharge. No scleral icterus. Neck: Normal range of motion. Neck supple. Cardiovascular: Normal rate, regular rhythm, normal  heart sounds and intact distal pulses.   Pulmonary/Chest: Effort normal.  Quiet breath sounds in all lung fields.  No respiratory distress. No wheezes. No rales. Abdominal: Soft. Bowel sounds are normal. Exhibits no distension and no mass. There is no tenderness.  Musculoskeletal: She has limited range of motion in her left shoulder secondary to pain.  Hands are well-perfused and warm bilaterally.  Exhibits no edema.  Newness to palpation over the lateral aspect of the upper humerus. Lymphadenopathy:    slightly enlarged symmetric left and right upper cervical lymph node.  Neurological: Alert and oriented to person, place, and time. Exhibits muscle wasting. Examined in the wheelchair. Chronic left sided weakness due to hx of  CVA.  Skin: Skin is warm and dry. No rash noted. Not diaphoretic. No erythema. No pallor.  Psychiatric: Mood, memory and judgment normal. Vitals reviewed.  LABORATORY DATA: Lab Results  Component Value Date   WBC 6.0 01/02/2023   HGB 11.9 (L) 01/02/2023   HCT 34.5 (L) 01/02/2023   MCV 106.5 (H) 01/02/2023   PLT 216 01/02/2023      Chemistry      Component Value Date/Time   NA 138 12/12/2022 1319   NA 141 08/01/2017 0835   K 3.5 12/12/2022 1319   K 3.5 08/01/2017 0835   CL 105 12/12/2022 1319   CO2 29 12/12/2022 1319   CO2 25 08/01/2017 0835   BUN 6 (L) 12/12/2022 1319   BUN 8.0 08/01/2017 0835   CREATININE 0.64 12/12/2022 1319   CREATININE 0.9 08/01/2017 0835      Component Value Date/Time   CALCIUM 8.5 (L) 12/12/2022 1319   CALCIUM 9.9 08/01/2017 0835   ALKPHOS 98 12/12/2022 1319   ALKPHOS 142 08/01/2017 0835   AST 16 12/12/2022 1319   AST 15 08/01/2017 0835   ALT 11 12/12/2022 1319   ALT 19 08/01/2017 0835   BILITOT 0.6 12/12/2022 1319   BILITOT 0.35 08/01/2017 0835       RADIOGRAPHIC STUDIES:  CT Chest W Contrast  Result Date: 01/02/2023 CLINICAL DATA:  Staging of small cell lung cancer * Tracking Code: BO * EXAM: CT CHEST, ABDOMEN, AND PELVIS WITH CONTRAST TECHNIQUE: Multidetector CT imaging of the chest, abdomen and pelvis was performed following the standard protocol during bolus administration of intravenous contrast. RADIATION DOSE REDUCTION: This exam was performed according to the departmental dose-optimization program which includes automated exposure control, adjustment of the mA and/or kV according to patient size and/or use of iterative reconstruction technique. CONTRAST:  OMNIPAQUE IOHEXOL 300 MG/ML  SOLN COMPARISON:  09/18/2022 FINDINGS: CT CHEST FINDINGS Cardiovascular: Right Port-A-Cath tip: Cavoatrial junction. Coronary, aortic arch, and branch vessel atherosclerotic vascular disease. Unchanged chronic ulcerated plaque along the lateral  transverse aorta. Mediastinum/Nodes: Densely calcified 1.1 cm right thyroid nodule, unchanged. Not clinically significant; no follow-up imaging recommended (ref: J Am Coll Radiol. 2015 Feb;12(2): 143-50).No pathologic adenopathy observed. Lungs/Pleura: Peripheral triangular right apical density measuring 3.9 by 2.1 by 3.4 cm on image 57 series 4, no change from 09/18/2022, corresponding to sequela of prior radiation therapy. Stable scarring in the posterior basal segments of both lower lobes. Musculoskeletal: Stable endplate compressions at T7, T8, and T9. CT ABDOMEN PELVIS FINDINGS Hepatobiliary: Unremarkable Pancreas: Unremarkable Spleen: Unremarkable Adrenals/Urinary Tract: Unremarkable Stomach/Bowel: Unremarkable Vascular/Lymphatic: Substantial atherosclerosis is present, including aortoiliac atherosclerotic disease. Atheromatous plaque noted at the origin of the celiac trunk without overt occlusion. There is some soft atheromatous plaque proximally in the SMA, without occlusion. No pathologic adenopathy identified. Reproductive:  Uterus absent.  Adnexa unremarkable. Other: No supplemental non-categorized findings. Musculoskeletal: Mild levoconvex lumbar scoliosis. Chronic superior endplate compression at L2. IMPRESSION: 1. No findings of active malignancy. 2. Stable peripheral triangular right apical density compatible with sequela from prior radiation therapy. 3. Stable scarring in the posterior basal segments of both lower lobes. 4. Stable chronic ulcerated plaque along the lateral transverse aorta. 5. Stable chronic endplate compressions at T7, T8, T9, and L2. 6. Aortic, coronary, and systemic atherosclerosis. Aortic Atherosclerosis (ICD10-I70.0). Electronically Signed   By: Gaylyn Rong M.D.   On: 01/02/2023 10:54   CT Abdomen Pelvis W Contrast  Result Date: 01/02/2023 CLINICAL DATA:  Staging of small cell lung cancer * Tracking Code: BO * EXAM: CT CHEST, ABDOMEN, AND PELVIS WITH CONTRAST TECHNIQUE:  Multidetector CT imaging of the chest, abdomen and pelvis was performed following the standard protocol during bolus administration of intravenous contrast. RADIATION DOSE REDUCTION: This exam was performed according to the departmental dose-optimization program which includes automated exposure control, adjustment of the mA and/or kV according to patient size and/or use of iterative reconstruction technique. CONTRAST:  OMNIPAQUE IOHEXOL 300 MG/ML  SOLN COMPARISON:  09/18/2022 FINDINGS: CT CHEST FINDINGS Cardiovascular: Right Port-A-Cath tip: Cavoatrial junction. Coronary, aortic arch, and branch vessel atherosclerotic vascular disease. Unchanged chronic ulcerated plaque along the lateral transverse aorta. Mediastinum/Nodes: Densely calcified 1.1 cm right thyroid nodule, unchanged. Not clinically significant; no follow-up imaging recommended (ref: J Am Coll Radiol. 2015 Feb;12(2): 143-50).No pathologic adenopathy observed. Lungs/Pleura: Peripheral triangular right apical density measuring 3.9 by 2.1 by 3.4 cm on image 57 series 4, no change from 09/18/2022, corresponding to sequela of prior radiation therapy. Stable scarring in the posterior basal segments of both lower lobes. Musculoskeletal: Stable endplate compressions at T7, T8, and T9. CT ABDOMEN PELVIS FINDINGS Hepatobiliary: Unremarkable Pancreas: Unremarkable Spleen: Unremarkable Adrenals/Urinary Tract: Unremarkable Stomach/Bowel: Unremarkable Vascular/Lymphatic: Substantial atherosclerosis is present, including aortoiliac atherosclerotic disease. Atheromatous plaque noted at the origin of the celiac trunk without overt occlusion. There is some soft atheromatous plaque proximally in the SMA, without occlusion. No pathologic adenopathy identified. Reproductive: Uterus absent.  Adnexa unremarkable. Other: No supplemental non-categorized findings. Musculoskeletal: Mild levoconvex lumbar scoliosis. Chronic superior endplate compression at L2. IMPRESSION:  1. No findings of active malignancy. 2. Stable peripheral triangular right apical density compatible with sequela from prior radiation therapy. 3. Stable scarring in the posterior basal segments of both lower lobes. 4. Stable chronic ulcerated plaque along the lateral transverse aorta. 5. Stable chronic endplate compressions at T7, T8, T9, and L2. 6. Aortic, coronary, and systemic atherosclerosis. Aortic Atherosclerosis (ICD10-I70.0). Electronically Signed   By: Gaylyn Rong M.D.   On: 01/02/2023 10:54     ASSESSMENT/PLAN:  This is a very pleasant 62 year old Caucasian female with extensive stage small cell lung cancer.  She presented with a right upper lobe lung mass, large right anterior mediastinal and supraclavicular lymphadenopathy as well as a pancreatic and splenic metastasis.  She was diagnosed in September 2017.    The patient underwent systemic chemotherapy with carboplatin and etoposide.  She is status post 6 cycles.  She tolerated treatment well except for chemotherapy-induced anemia which required  pRBCs and platelet transfusions.  She had a significant improvement of her disease with chemotherapy. The patient then underwent prophylactic cranial irradiation. She then underwent stereotactic radiotherapy to the right upper lobe and pulmonary nodule.   She had been on observation for 2 years before showing evidence of disease progression.   She then was started on systemic  chemotherapy with carboplatin, etoposide, and Tecentriq. Starting from cycle #5, she has been on maintenance single agent Tecentriq.  She has been tolerating treatment well. She is status post 72 cycles of Tecentriq total.  The patient was seen with Dr. Arbutus Ped. Dr. Arbutus Ped personally and independently reviewed the scan and discussed the results with the patient. The scan showed no evidence of disease progression.   Labs were reviewed. Her CMP is pending. As long as this is in parameters, recommend that she proceed  with cycle #73 today as schedule.  We will see her back for follow-up visit in 3 weeks for evaluation repeat blood work before undergoing cycle #74  She is scheduled to see nutrition and palliative care today.   Despite having an usually good response to treatment from a cancer standpoint, she overall experiences fatigue and weakness at baseline. We talked to her again today about deconditioning and the importance of good nutrition, health benefits of quitting smoking, and managing her other comorbidity. I have unsuccessfully tried to have her establish care with a PCP.   The patient was advised to call immediately if she has any concerning symptoms in the interval. The patient voices understanding of current disease status and treatment options and is in agreement with the current care plan. All questions were answered. The patient knows to call the clinic with any problems, questions or concerns. We can certainly see the patient much sooner if necessary    No orders of the defined types were placed in this encounter.     Luma Clopper L Keyston Ardolino, PA-C 01/02/23  ADDENDUM: Hematology/Oncology Attending: I had a face-to-face encounter with the patient today.  I reviewed her record, lab, scan and recommended her care plan.  This is a very pleasant 62 years old white female with extensive stage small cell lung cancer that was initially diagnosed in September 2017 with disease progression in October 2019.  She is status post several treatment including a course of systemic chemotherapy concurrent with radiation and in 2019 she started treatment again with systemic chemotherapy with carboplatin, etoposide as well as Tecentriq for 5 cycles followed by maintenance treatment with Tecentriq since that time.  She is status post 72 cycles of her treatment with immunotherapy and has been tolerating this treatment fairly well with no concerning adverse effect except for intermittent itching. She had  repeat CT scan of the chest, abdomen and pelvis performed recently.  I personally and independently reviewed the scan and discussed the result with the patient today. Her scan showed no concerning findings for disease progression. I recommended for the patient to continue her current treatment with Tecentriq and she will proceed with cycle #73 today. She will come back for follow-up visit in 3 weeks for evaluation before the next cycle of her treatment. The patient was advised to call immediately if she has any concerning symptoms in the interval. The total time spent in the appointment was 30 minutes. Disclaimer: This note was dictated with voice recognition software. Similar sounding words can inadvertently be transcribed and may be missed upon review. Lajuana Matte, MD

## 2022-12-31 ENCOUNTER — Ambulatory Visit (HOSPITAL_COMMUNITY)
Admission: RE | Admit: 2022-12-31 | Discharge: 2022-12-31 | Disposition: A | Discharge status: Home / Self Care | Payer: Medicare Other | Source: Ambulatory Visit | Attending: Internal Medicine | Admitting: Internal Medicine

## 2022-12-31 DIAGNOSIS — C3490 Malignant neoplasm of unspecified part of unspecified bronchus or lung: Secondary | ICD-10-CM | POA: Insufficient documentation

## 2022-12-31 MED ORDER — IOHEXOL 9 MG/ML PO SOLN
ORAL | Status: AC
  Filled 2022-12-31: qty 1000

## 2022-12-31 MED ORDER — SODIUM CHLORIDE (PF) 0.9 % IJ SOLN
INTRAMUSCULAR | Status: AC
  Filled 2022-12-31: qty 50

## 2022-12-31 MED ORDER — IOHEXOL 9 MG/ML PO SOLN
1000.0000 mL | ORAL | Status: AC
  Administered 2022-12-31: 1000 mL via ORAL

## 2022-12-31 MED ORDER — IOHEXOL 300 MG/ML  SOLN
100.0000 mL | Freq: Once | INTRAMUSCULAR | Status: AC | PRN
  Administered 2022-12-31: 100 mL via INTRAVENOUS

## 2023-01-01 NOTE — Progress Notes (Unsigned)
Palliative Medicine [facility]  [phone] [phone]   Name: Debra Kidd Date: 01/01/2023 MRN: [id number]  DOB: 10-31-1960  Patient Care Team: Pcp, No as PCP - General    INTERVAL HISTORY: Debra Kidd is a 62 y.o. 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  Palliative ask to see for symptom management and goals of care.   SOCIAL HISTORY:     reports that she has been smoking cigarettes. She has a 86.00 pack-year smoking history. She has never used smokeless tobacco. She reports that she does not currently use alcohol. She reports current drug use. Frequency: 7.00 times per week. Drug: Marijuana.  ADVANCE DIRECTIVES:    CODE STATUS:   PAST MEDICAL HISTORY: Past Medical History:  Diagnosis Date   Basal cell carcinoma of cheek    L side of face   Blood type, Rh positive    Cancer (HCC)    Chronic pain syndrome    Depression 08/07/2016   Encounter for antineoplastic chemotherapy 07/17/2016   History of external beam radiation therapy 11/19/16-12/02/16   brain 25 Gy in 10 fractions   Hyperlipidemia    Hypertension    Hypertension 08/07/2016   Osteoporosis    Small cell lung cancer (HCC) dx'd 05/2016    ALLERGIES:  is allergic to codeine, motrin [ibuprofen], thiazide-type diuretics, and vicodin [hydrocodone-acetaminophen].  MEDICATIONS:  Current Outpatient Medications  Medication Sig Dispense Refill   atorvastatin (LIPITOR) 80 MG tablet Take 1 tablet (80 mg total) by mouth daily. (Patient not taking: Reported on 11/20/2022) 30 tablet 11   clopidogrel (PLAVIX) 75 MG tablet Take 1 tablet (75 mg total) by mouth daily. (Patient not taking: Reported on 11/20/2022) 90 tablet 3   diazepam (VALIUM) 5 MG tablet Take 1 tablet (5 mg total) by mouth every  12 (twelve) hours as needed for anxiety. 45 tablet 0   lidocaine-prilocaine (EMLA) cream Apply 1 Application topically as needed. 30 g 2   oxyCODONE-acetaminophen (PERCOCET/ROXICET) 5-325 MG tablet Take 1 tablet by mouth every 6 (six) hours as needed for severe pain. 60 tablet 0   potassium chloride 20 MEQ/15ML (10%) SOLN Take 15 mLs (20 mEq total) by mouth 2 (two) times daily. 300 mL 0   Vitamin D, Ergocalciferol, [name],000 Units by mouth 3 (three) times a week.     No current facility-administered medications for this visit.    VITAL SIGNS: There were no vitals taken for this visit. There were no vitals filed for this visit.  Estimated body mass index is 17.84 kg/m as calculated from the following:   Height as of 10/09/22: 5\' 3"  (1.6 m).   Weight as of 12/12/22: 100 lb 11.2 oz (45.7 kg).   PERFORMANCE STATUS (ECOG) : 1 - Symptomatic but completely ambulatory  Assessment NAD, sitting up in recliner Normal breathing pattern RRR AAO x4  IMPRESSION:   I saw Debra Kidd during her infusion. No acute distress. Reports overall she is doing good.  Nuys nausea, vomiting, constipation, or diarrhea.  Is trying to remain as active as possible.  Appetite is okay.  Chronic pain Debra Kidd endorses pain being well controlled.  Some days are better than others.  Does endorse increase in pain and discomfort based on level of activity.   We reviewed  patient's current regimen.  She is taking hydrocodone 5/325 as needed.    Will continue to closely monitor and adjust medications as needed.   Constipation Controlled with daily regimen.    Anxiety/Insomnia Endorses anxiety and nervousness. We discussed use of Valium. She is taking at minimum once daily. Discussed potential to take twice daily as needed. She verbalized understanding.   I discussed the importance of continued conversation with family and their medical providers regarding overall plan of care  and treatment options, ensuring decisions are within the context of the patients values and GOCs.  PLAN:  Oxycodone 5/325 1 tablet every 6 hours as needed for pain Valium 5 mg 1 tablet every 12 hours as needed for anxiety/insomnia Pain contract completed. Senna S daily for bowel regimen I will plan to see patient back in 3-4 weeks in collaboration to other oncology appointments   Patient expressed understanding and was in agreement with this plan. She also understands that She can call the clinic at any time with any questions, concerns, or complaints.   Any controlled substances utilized were prescribed in the context of palliative care. PDMP has been reviewed.    Visit consisted of counseling and education dealing with the complex and emotionally intense issues of symptom management and palliative care in the setting of serious and potentially life-threatening illness.Greater than 50%  of this time was spent counseling and coordinating care related to the above assessment and plan.  [name], AGPCNP-BC  Palliative Medicine Team/[facility]  *Please note that this is a verbal dictation therefore any spelling or grammatical errors are due to the "[employer]agon [employer] One" system interpretation.

## 2023-01-02 ENCOUNTER — Inpatient Hospital Stay: Payer: Medicare Other

## 2023-01-02 ENCOUNTER — Inpatient Hospital Stay (HOSPITAL_BASED_OUTPATIENT_CLINIC_OR_DEPARTMENT_OTHER): Payer: Medicare Other | Admitting: Nurse Practitioner

## 2023-01-02 ENCOUNTER — Inpatient Hospital Stay: Payer: Medicare Other | Admitting: Nutrition

## 2023-01-02 ENCOUNTER — Inpatient Hospital Stay (HOSPITAL_BASED_OUTPATIENT_CLINIC_OR_DEPARTMENT_OTHER): Payer: Medicare Other | Admitting: Physician Assistant

## 2023-01-02 ENCOUNTER — Encounter: Payer: Self-pay | Admitting: Nurse Practitioner

## 2023-01-02 ENCOUNTER — Inpatient Hospital Stay: Payer: Medicare Other | Attending: Internal Medicine

## 2023-01-02 ENCOUNTER — Encounter: Payer: Self-pay | Admitting: Nutrition

## 2023-01-02 ENCOUNTER — Other Ambulatory Visit: Payer: Self-pay

## 2023-01-02 VITALS — Ht 63.0 in | Wt 99.2 lb

## 2023-01-02 VITALS — BP 112/77 | HR 72 | Temp 98.2°F | Resp 16

## 2023-01-02 DIAGNOSIS — R590 Localized enlarged lymph nodes: Secondary | ICD-10-CM | POA: Insufficient documentation

## 2023-01-02 DIAGNOSIS — Z85828 Personal history of other malignant neoplasm of skin: Secondary | ICD-10-CM | POA: Insufficient documentation

## 2023-01-02 DIAGNOSIS — I700 Atherosclerosis of aorta: Secondary | ICD-10-CM | POA: Insufficient documentation

## 2023-01-02 DIAGNOSIS — Z923 Personal history of irradiation: Secondary | ICD-10-CM | POA: Diagnosis not present

## 2023-01-02 DIAGNOSIS — Z515 Encounter for palliative care: Secondary | ICD-10-CM | POA: Diagnosis not present

## 2023-01-02 DIAGNOSIS — I10 Essential (primary) hypertension: Secondary | ICD-10-CM | POA: Insufficient documentation

## 2023-01-02 DIAGNOSIS — K5900 Constipation, unspecified: Secondary | ICD-10-CM | POA: Insufficient documentation

## 2023-01-02 DIAGNOSIS — Z7902 Long term (current) use of antithrombotics/antiplatelets: Secondary | ICD-10-CM | POA: Diagnosis not present

## 2023-01-02 DIAGNOSIS — C3491 Malignant neoplasm of unspecified part of right bronchus or lung: Secondary | ICD-10-CM

## 2023-01-02 DIAGNOSIS — C7889 Secondary malignant neoplasm of other digestive organs: Secondary | ICD-10-CM | POA: Insufficient documentation

## 2023-01-02 DIAGNOSIS — M810 Age-related osteoporosis without current pathological fracture: Secondary | ICD-10-CM | POA: Insufficient documentation

## 2023-01-02 DIAGNOSIS — F419 Anxiety disorder, unspecified: Secondary | ICD-10-CM | POA: Diagnosis not present

## 2023-01-02 DIAGNOSIS — Z5112 Encounter for antineoplastic immunotherapy: Secondary | ICD-10-CM | POA: Diagnosis present

## 2023-01-02 DIAGNOSIS — E785 Hyperlipidemia, unspecified: Secondary | ICD-10-CM | POA: Insufficient documentation

## 2023-01-02 DIAGNOSIS — G894 Chronic pain syndrome: Secondary | ICD-10-CM | POA: Insufficient documentation

## 2023-01-02 DIAGNOSIS — F17210 Nicotine dependence, cigarettes, uncomplicated: Secondary | ICD-10-CM | POA: Insufficient documentation

## 2023-01-02 DIAGNOSIS — C3411 Malignant neoplasm of upper lobe, right bronchus or lung: Secondary | ICD-10-CM | POA: Diagnosis not present

## 2023-01-02 DIAGNOSIS — G893 Neoplasm related pain (acute) (chronic): Secondary | ICD-10-CM

## 2023-01-02 DIAGNOSIS — Z79899 Other long term (current) drug therapy: Secondary | ICD-10-CM | POA: Diagnosis not present

## 2023-01-02 LAB — CMP (CANCER CENTER ONLY)
ALT: 13 U/L (ref 0–44)
AST: 19 U/L (ref 15–41)
Albumin: 3.7 g/dL (ref 3.5–5.0)
Alkaline Phosphatase: 100 U/L (ref 38–126)
Anion gap: 4 — ABNORMAL LOW (ref 5–15)
BUN: 5 mg/dL — ABNORMAL LOW (ref 8–23)
CO2: 30 mmol/L (ref 22–32)
Calcium: 8.6 mg/dL — ABNORMAL LOW (ref 8.9–10.3)
Chloride: 105 mmol/L (ref 98–111)
Creatinine: 0.65 mg/dL (ref 0.44–1.00)
GFR, Estimated: >60 mL/min (ref >60)
Glucose, Bld: 94 mg/dL (ref 70–99)
Potassium: 3.6 mmol/L (ref 3.5–5.1)
Sodium: 139 mmol/L (ref 135–145)
Total Bilirubin: 0.4 mg/dL (ref 0.3–1.2)
Total Protein: 6.2 g/dL — ABNORMAL LOW (ref 6.5–8.1)

## 2023-01-02 LAB — CBC WITH DIFFERENTIAL (CANCER CENTER ONLY)
Abs Immature Granulocytes: 0.01 10*3/uL (ref 0.00–0.07)
Basophils Absolute: 0 10*3/uL (ref 0.0–0.1)
Basophils Relative: 1 %
Eosinophils Absolute: 0.3 10*3/uL (ref 0.0–0.5)
Eosinophils Relative: 5 %
HCT: 34.5 % — ABNORMAL LOW (ref 36.0–46.0)
Hemoglobin: 11.9 g/dL — ABNORMAL LOW (ref 12.0–15.0)
Immature Granulocytes: 0 %
Lymphocytes Relative: 31 %
Lymphs Abs: 1.9 10*3/uL (ref 0.7–4.0)
MCH: 36.7 pg — ABNORMAL HIGH (ref 26.0–34.0)
MCHC: 34.5 g/dL (ref 30.0–36.0)
MCV: 106.5 fL — ABNORMAL HIGH (ref 80.0–100.0)
Monocytes Absolute: 0.5 10*3/uL (ref 0.1–1.0)
Monocytes Relative: 8 %
Neutro Abs: 3.3 10*3/uL (ref 1.7–7.7)
Neutrophils Relative %: 55 %
Platelet Count: 216 10*3/uL (ref 150–400)
RBC: 3.24 MIL/uL — ABNORMAL LOW (ref 3.87–5.11)
RDW: 12.4 % (ref 11.5–15.5)
WBC Count: 6 10*3/uL (ref 4.0–10.5)
nRBC: 0 % (ref 0.0–0.2)

## 2023-01-02 MED ORDER — HEPARIN SOD (PORK) LOCK FLUSH 100 UNIT/ML IV SOLN: 500.0000 [IU] | Freq: Once | INTRAVENOUS | Status: DC | PRN

## 2023-01-02 MED ORDER — OXYCODONE-ACETAMINOPHEN 5-325 MG PO TABS
1.0000 | ORAL_TABLET | Freq: Four times a day (QID) | ORAL | 0 refills | Status: DC | PRN
Start: 2023-01-02 — End: 2023-01-22

## 2023-01-02 MED ORDER — DIAZEPAM 5 MG PO TABS
5.0000 mg | ORAL_TABLET | Freq: Two times a day (BID) | ORAL | 0 refills | Status: DC | PRN
Start: 2023-01-02 — End: 2023-02-11

## 2023-01-02 MED ORDER — SODIUM CHLORIDE 0.9 % IV SOLN: Freq: Once | INTRAVENOUS | Status: AC

## 2023-01-02 MED ORDER — SODIUM CHLORIDE 0.9 % IV SOLN
1200.0000 mg | Freq: Once | INTRAVENOUS | Status: AC
  Administered 2023-01-02: 1200 mg via INTRAVENOUS
  Filled 2023-01-02: qty 20

## 2023-01-02 MED ORDER — SODIUM CHLORIDE 0.9% FLUSH: 10.0000 mL | INTRAVENOUS | Status: DC | PRN

## 2023-01-02 NOTE — Progress Notes (Unsigned)
Nutrition follow-up completed with patient during infusion for lung cancer.  Weight documented as 99.4 pounds on May 2 slightly decreased from 100 pounds 11 ounces on April 11.  Labs reviewed.  Patient reports she is ravenous and is eating all the time.  Reports she either has constipation or diarrhea.  It is always 1 or the other and no in between.  Reports she takes Senokot occasionally.  Reports she does have difficulty affording Ensure however she declines offer to provide additional samples.  Nutrition diagnosis: Unintended weight loss remains stable.  Intervention: Provided support and encouragement to patient for continuing smaller more frequent meals and snacks.  Encouraged her to choose higher calorie foods.  Encouraged Ensure Plus or other nutrition drink as desired.  Monitoring, evaluation, goals: Patient will tolerate increased calories and protein to promote weight stabilization/minimize weight loss.  No follow-up scheduled at this time.

## 2023-01-02 NOTE — Progress Notes (Deleted)
Nutrition follow-up completed with patient during infusion.  She is receiving treatment for non-small cell lung cancer.  Weight documented as 115 pounds 14.4 ounces May 2.  This is stable from 115 pounds 6.4 ounces March 28.  Patient reports she has diarrhea occasionally.  States that she has been drinking Ensure Plus and has diarrhea immediately after consuming.  States she generally chugs these drinks at room temperature.  She has been trying to consume 2 daily.  Nutrition diagnosis: Sub-optimal intake continues.  Intervention: Provided strategies and tips to continue small frequent meals and snacks using high-calorie, high-protein foods. Encourage patient to consume 4 ounces of Ensure Plus slowly over 5-10 minutes.  Monitor if diarrhea occurs.  If patient feels well after consuming a small amount of Ensure Plus slowly she can slowly increase the amount of Ensure she is drinking throughout the day but continue to drink slowly. Provided additional complementary case of Ensure Plus.  Monitoring, evaluation, goals: Patient will tolerate added calories and protein to minimize weight loss.  Next visit: Wednesday, May 22 during infusion with Rosalita Chessman.  **Disclaimer: This note was dictated with voice recognition software. Similar sounding words can inadvertently be transcribed and this note may contain transcription errors which may not have been corrected upon publication of note.**

## 2023-01-10 ENCOUNTER — Encounter: Payer: Self-pay | Admitting: Orthopedic Surgery

## 2023-01-10 ENCOUNTER — Ambulatory Visit (INDEPENDENT_AMBULATORY_CARE_PROVIDER_SITE_OTHER): Payer: Medicare Other | Admitting: Orthopedic Surgery

## 2023-01-10 ENCOUNTER — Telehealth: Payer: Self-pay

## 2023-01-10 ENCOUNTER — Other Ambulatory Visit (INDEPENDENT_AMBULATORY_CARE_PROVIDER_SITE_OTHER): Payer: Medicare Other

## 2023-01-10 DIAGNOSIS — S32020A Wedge compression fracture of second lumbar vertebra, initial encounter for closed fracture: Secondary | ICD-10-CM | POA: Diagnosis not present

## 2023-01-10 DIAGNOSIS — M545 Low back pain, unspecified: Secondary | ICD-10-CM

## 2023-01-10 NOTE — Telephone Encounter (Signed)
Pls make sure scheduled per Dr August Saucer

## 2023-01-10 NOTE — Progress Notes (Signed)
Office Visit Note   Patient: Debra Kidd           Date of Birth: Jul 15, 1961           MRN: [id number] Visit Date: 01/10/2023 Requested by: No referring provider defined for this encounter. PCP: Pcp, No  Subjective: Chief Complaint  Patient presents with   Lower Back - Pain    HPI: Debra Kidd is a 62 y.o. female who presents to the office reporting low back pain.  Reports relatively constant pain.  Did have a CT scan about a month ago which showed stable endplate compression at T7-T8 and T9.  She states that the pain does radiate around and it will wake her from sleep but does not radiate into her legs.  She has a history of cancer..                ROS: All systems reviewed are negative as they relate to the chief complaint within the history of present illness.  Patient denies fevers or chills.  Assessment & Plan: Visit Diagnoses:  1. Low back pain, unspecified back pain laterality, unspecified chronicity, unspecified whether sciatica present     Plan: Impression is back pain with possible new L2 compression fracture which is about 30% compression.  Plan is TL corset with rigid stays.  I think that may help her when she is up and around.  She will need to come back in about 6 weeks for clinical recheck and radiographs.  Follow-Up Instructions: No follow-ups on file.   Orders:  Orders Placed This Encounter  Procedures   XR Lumbar Spine 2-3 Views   No orders of the defined types were placed in this encounter.     Procedures: No procedures performed   Clinical Data: No additional findings.  Objective: Vital Signs: There were no vitals taken for this visit.  Physical Exam:  Constitutional: Patient appears well-developed HEENT:  Head: Normocephalic Eyes:EOM are normal Neck: Normal range of motion Cardiovascular: Normal rate Pulmonary/chest: Effort normal Neurologic: Patient is alert Skin: Skin is warm Psychiatric: Patient has normal mood and  affect  Ortho Exam: Ortho exam demonstrates no nerve root tension signs.  Patient has good ankle dorsiflexion plantarflexion quad and hamstring strength.  Patient does have diminished body mass index.  Does have pain with forward lateral bending.  Specialty Comments:  No specialty comments available.  Imaging: No results found.   PMFS History: Patient Active Problem List   Diagnosis Date Noted   Ingrown toenail of left foot 09/23/2019   Decreased appetite 04/08/2019   Carotid stenosis 03/02/2019   Acute ischemic stroke (HCC) 01/29/2019   Left-sided weakness 01/29/2019   COVID-19 virus infection 01/29/2019   Encounter for antineoplastic immunotherapy 10/06/2018   Protein calorie malnutrition (HCC) 10/06/2018   Hypercalcemia due to severe hypomagnesemia 08/25/2018   Hypomagnesemia- Cisplastin Induced 08/25/2018   Hypokalemia due to persistent hypomagnesemia 08/25/2018   Thoracic compression fracture, closed, initial encounter-T5, T6 and T8 Acute 08/25/2018   Intractable back pain 08/24/2018   Antineoplastic chemotherapy induced pancytopenia (CODE) (HCC) 07/27/2018   Goals of care, counseling/discussion 06/11/2018   Peripheral edema 10/10/2016   Depression 08/07/2016   Anxiety 08/07/2016   Hypertension 08/07/2016   Antineoplastic chemotherapy induced anemia 08/07/2016   Encounter for antineoplastic chemotherapy 07/17/2016   Hypersensitivity reaction 06/28/2016   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   Mediastinal mass 05/13/2016   Past Medical History:  Diagnosis Date   Basal cell carcinoma of  cheek    L side of face   Blood type, Rh positive    Cancer (HCC)    Chronic pain syndrome    Depression 08/07/2016   Encounter for antineoplastic chemotherapy 07/17/2016   History of external beam radiation therapy 11/19/16-12/02/16   brain 25 Gy in 10 fractions   Hyperlipidemia    Hypertension    Hypertension 08/07/2016   Osteoporosis    Small cell lung cancer (HCC)  dx'd 05/2016    Family History  Problem Relation Age of Onset   Heart disease Mother    Heart disease Father    Emphysema Brother    Breast cancer Maternal Grandmother    Heart disease Brother     Past Surgical History:  Procedure Laterality Date   ABDOMINAL HYSTERECTOMY  1981   APPENDECTOMY     at age 10   EYE SURGERY     left   IR GENERIC HISTORICAL  06/03/2016   IR FLUORO GUIDE PORT INSERTION RIGHT 06/03/2016 [facility]   IR GENERIC HISTORICAL  06/03/2016   IR US GUIDE VASC ACCESS RIGHT 06/03/2016 [facility]   SKIN CANCER EXCISION     basal cell carcinoma L side of face   Social History   Occupational History   Not on file  Tobacco Use   Smoking status: Every Day    Packs/day: 2.00    Years: 43.00    Additional pack years: 0.00    Total pack years: 86.00    Types: Cigarettes   Smokeless tobacco: Never  Vaping Use   Vaping Use: Never used  Substance and Sexual Activity   Alcohol use: Not Currently    Comment: quit drinking beer 02/2016   Drug use: Yes    Frequency: 7.0 times per week    Types: Marijuana   Sexual activity: Not on file

## 2023-01-10 NOTE — Telephone Encounter (Signed)
-----   Message from Cammy Copa, MD sent at 01/10/2023  3:44 PM EDT ----- Cresenciano Lick can you have this patient follow-up with [facility] in 6 weeks with repeat x-rays.  Thanks

## 2023-01-17 NOTE — Progress Notes (Addendum)
[facility] OFFICE PROGRESS NOTE  Pcp, [name], N3, [name], [name], 2 and 3 with Neulasta support. Status post 6 cycles with significant response of her disease. 2) Prophylactic cranial irradiation under the care of Dr. Roselind Messier on 12/02/2016. 3) stereotactic radiotherapy to the recurrent right upper lobe pulmonary nodule under the care of Dr. Roselind Messier completed November 10, 2017. 4) Retreatment with systemic chemotherapy with carboplatin for AUC of 5 on day 1 and etoposide 100 mg/M2 on days 1, 2 and 3 as well as Tecentriq (Atezolizumab) 1200 mg IV every 3 weeks with Neulasta support.  First dose June 22, 2018 for disease recurrence.  Status post 5 cycles.  Starting from cycle #2 her dose of carboplatin will be reduced to AUC of 4 and etoposide 80 mg/M2 on days 1, 2 and 3 in addition to the regular dose of Tecentriq.  CURRENT THERAPY: Maintenance treatment with single agent Tecentriq 1200 mg IV every 3 weeks.  Status post 74 cycles.    INTERVAL HISTORY: Debra Kidd 62 y.o. female returns to the clinic today for a follow-up visit.  The patient is currently being followed for metastatic small cell lung cancer for several years for which she has been on maintenance Tecentriq IV every 3 weeks.  She does tolerate this well except for dry skin and itching. She uses hydrocortisone cream. She denies changes in her health today.  Overall though, she is not in good health and is very deconditioned at baseline due to comorbidities and poor nutrition and not being very active. She is supposed  to see nutrition today. She also sees palliative care for management of pain and anxiety. She often receives complementary food items and supplemental protein drinks in our clinic to take home. She is scheduled to see nutrition and palliative care while in the infusion room today. She saw her orthopedic provider in the interval for her low back pain and he recommended a back brace.    She denies any fever or night sweats. She reports she is cold natured and denies changes. She had some nausea yesterday due her back brace being too tight. She reports her baseline dyspnea on exertion which is stable without any chest pain or hemoptysis.  She reports a baseline mild cough and she continues to smoke approximately 2 packs of cigarettes per day. She has intermittent diarrhea one episode every 2-3 days. She has some blurry vision when she tries to look at computer screens; however, she is unable to get into the reading glasses section of [employer] due to being unable to walk that far. She does not have a wheelchair despite being fairly deconditioned and having history of strokes and left sided weakness. She states she struggles financially and does not have the money for a wheelchair. She is here today for evaluation before undergoing cycle #75.     MEDICAL HISTORY: Past Medical History:  Diagnosis Date   Basal cell carcinoma of cheek    L side of face   Blood type, Rh positive    Cancer (HCC)    Chronic pain syndrome  Depression 08/07/2016   Encounter for antineoplastic chemotherapy 07/17/2016   History of external beam radiation therapy 11/19/16-12/02/16   brain 25 Gy in 10 fractions   Hyperlipidemia    Hypertension    Hypertension 08/07/2016   Osteoporosis    Small cell lung cancer (HCC) dx'd 05/2016    ALLERGIES:  is allergic to codeine, motrin [ibuprofen], thiazide-type diuretics, and vicodin [hydrocodone-acetaminophen].  MEDICATIONS:  Current Outpatient Medications  Medication Sig Dispense  Refill   atorvastatin (LIPITOR) 80 MG tablet Take 1 tablet (80 mg total) by mouth daily. 30 tablet 11   clopidogrel (PLAVIX) 75 MG tablet Take 1 tablet (75 mg total) by mouth daily. 90 tablet 3   diazepam (VALIUM) 5 MG tablet Take 1 tablet (5 mg total) by mouth every 12 (twelve) hours as needed for anxiety. 45 tablet 0   lidocaine-prilocaine (EMLA) cream Apply 1 Application topically as needed. 30 g 2   oxyCODONE-acetaminophen (PERCOCET/ROXICET) 5-325 MG tablet Take 1 tablet by mouth every 6 (six) hours as needed for severe pain. 60 tablet 0   potassium chloride 20 MEQ/15ML (10%) SOLN Take 15 mLs (20 mEq total) by mouth 2 (two) times daily. 300 mL 0   Vitamin D, Ergocalciferol, [name],000 Units by mouth 3 (three) times a week.     No current facility-administered medications for this visit.    SURGICAL HISTORY:  Past Surgical History:  Procedure Laterality Date   ABDOMINAL HYSTERECTOMY  1981   APPENDECTOMY     at age 52   EYE SURGERY     left   IR GENERIC HISTORICAL  06/03/2016   IR FLUORO GUIDE PORT INSERTION RIGHT 06/03/2016 [facility]   IR GENERIC HISTORICAL  06/03/2016   IR US GUIDE VASC ACCESS RIGHT 06/03/2016 [facility]   SKIN CANCER EXCISION     basal cell carcinoma L side of face    REVIEW OF SYSTEMS:   Review of Systems  Constitutional: Positive for stable decreased appetite and fatigue. Negative for  chills, fever and unexpected weight change.  HENT: Negative for mouth sores, nosebleeds, sore throat and trouble swallowing.  Eyes: Negative for eye problems and icterus.  Respiratory: Positive for stable SOB and mild cough which are her baseline. Negative for hemoptysis, and wheezing.   Cardiovascular: Negative for chest pain and leg swelling.  Gastrointestinal:  Negative for abdominal pain, recent diarrhea, constipation,  nausea and vomiting.  Genitourinary: Negative for bladder incontinence, difficulty urinating, dysuria,  frequency and hematuria.   Musculoskeletal: Positive for chronic back pain.  Positive for improved left upper extremity pain.  Negative for gait problem, neck pain and neck stiffness.  Skin: Positive for occasional itching. Negative for rash.  Neurological: Positive for generalized weakness.  Positive for left-sided weakness due to her history of stroke.  Negative for extremity weakness, gait problem, [name], depression and sleep disturbance. The patient is not nervous/anxious   PHYSICAL EXAMINATION:  There were no vitals taken for this visit.  ECOG PERFORMANCE STATUS: 2-3  Physical Exam  Constitutional: Oriented to person, place, and time and chronically ill appearing female appears older than stated age and in no distress. HENT: Head: Normocephalic and atraumatic. Mouth/Throat: Oropharynx is clear and moist. No oropharyngeal exudate. Eyes: Conjunctivae are normal. Right eye exhibits no discharge. Left eye exhibits no discharge. No scleral icterus. Neck: Normal range of motion. Neck supple. Cardiovascular: Normal rate,  regular rhythm, normal heart sounds and intact distal pulses.   Pulmonary/Chest: Effort normal.  Quiet breath sounds in all lung fields.  No respiratory distress. No wheezes. No rales. Abdominal: Soft. Bowel sounds are normal. Exhibits no distension and no mass. There is no tenderness.  Musculoskeletal: She has limited range of motion in her left shoulder secondary to pain.  Hands are well-perfused and warm bilaterally.  Exhibits no edema.  Newness to palpation over the lateral aspect of the upper humerus. Lymphadenopathy:    slightly enlarged symmetric left and right upper cervical lymph node.  Neurological: Alert and oriented to person, place, and time. Exhibits muscle wasting. Examined in the wheelchair. Chronic left sided weakness  due to hx of CVA.  Skin: Skin is warm and dry. No rash noted. Not diaphoretic. No erythema. No pallor.  Psychiatric: Mood, memory and judgment normal. Vitals reviewed.  LABORATORY DATA: Lab Results  Component Value Date   WBC 6.0 01/02/2023   HGB 11.9 (L) 01/02/2023   HCT 34.5 (L) 01/02/2023   MCV 106.5 (H) 01/02/2023   PLT 216 01/02/2023      Chemistry      Component Value Date/Time   NA 139 01/02/2023 1113   NA 141 08/01/2017 0835   K 3.6 01/02/2023 1113   K 3.5 08/01/2017 0835   CL 105 01/02/2023 1113   CO2 30 01/02/2023 1113   CO2 25 08/01/2017 0835   BUN 5 (L) 01/02/2023 1113   BUN 8.0 08/01/2017 0835   CREATININE 0.65 01/02/2023 1113   CREATININE 0.9 08/01/2017 0835      Component Value Date/Time   CALCIUM 8.6 (L) 01/02/2023 1113   CALCIUM 9.9 08/01/2017 0835   ALKPHOS 100 01/02/2023 1113   ALKPHOS 142 08/01/2017 0835   AST 19 01/02/2023 1113   AST 15 08/01/2017 0835   ALT 13 01/02/2023 1113   ALT 19 08/01/2017 0835   BILITOT 0.4 01/02/2023 1113   BILITOT 0.35 08/01/2017 0835       RADIOGRAPHIC STUDIES:  XR Lumbar Spine 2-3 Views  Result Date: 01/10/2023 AP lateral radiographs lumbar spine reviewed.  New mild L2 compression fracture is present with no spondylolisthesis.  Bones appear osteoporotic.  No destructive lesions noted within the bony architecture.  CT Chest W Contrast  Result Date: 01/02/2023 CLINICAL DATA:  Staging of small cell lung cancer * Tracking Code: BO * EXAM: CT CHEST, ABDOMEN, AND PELVIS WITH CONTRAST TECHNIQUE: Multidetector CT imaging of the chest, abdomen and pelvis was performed following the standard protocol during bolus administration of intravenous contrast. RADIATION DOSE REDUCTION: This exam was performed according to the departmental dose-optimization program which includes automated exposure control, adjustment of the mA and/or kV according to patient size and/or use of iterative reconstruction technique. CONTRAST:   OMNIPAQUE IOHEXOL 300 MG/ML  SOLN COMPARISON:  09/18/2022 FINDINGS: CT CHEST FINDINGS Cardiovascular: Right Port-A-Cath tip: Cavoatrial junction. Coronary, aortic arch, and branch vessel atherosclerotic vascular disease. Unchanged chronic ulcerated plaque along the lateral transverse aorta. Mediastinum/Nodes: Densely calcified 1.1 cm right thyroid nodule, unchanged. Not clinically significant; no follow-up imaging recommended (ref: J Am Coll Radiol. 2015 Feb;12(2): 143-50).No pathologic adenopathy observed. Lungs/Pleura: Peripheral triangular right apical density measuring 3.9 by 2.1 by 3.4 cm on image 57 series 4, no change from 09/18/2022, corresponding to sequela of prior radiation therapy. Stable scarring in the posterior basal segments of both lower lobes. Musculoskeletal: Stable endplate compressions at T7, T8, and T9. CT ABDOMEN PELVIS FINDINGS Hepatobiliary: Unremarkable Pancreas: Unremarkable Spleen: Unremarkable Adrenals/Urinary  Tract: Unremarkable Stomach/Bowel: Unremarkable Vascular/Lymphatic: Substantial atherosclerosis is present, including aortoiliac atherosclerotic disease. Atheromatous plaque noted at the origin of the celiac trunk without overt occlusion. There is some soft atheromatous plaque proximally in the SMA, without occlusion. No pathologic adenopathy identified. Reproductive: Uterus absent.  Adnexa unremarkable. Other: No supplemental non-categorized findings. Musculoskeletal: Mild levoconvex lumbar scoliosis. Chronic superior endplate compression at L2. IMPRESSION: 1. No findings of active malignancy. 2. Stable peripheral triangular right apical density compatible with sequela from prior radiation therapy. 3. Stable scarring in the posterior basal segments of both lower lobes. 4. Stable chronic ulcerated plaque along the lateral transverse aorta. 5. Stable chronic endplate compressions at T7, T8, T9, and L2. 6. Aortic, coronary, and systemic atherosclerosis. Aortic Atherosclerosis  (ICD10-I70.0). Electronically Signed   By: Gaylyn Rong M.D.   On: 01/02/2023 10:54   CT Abdomen Pelvis W Contrast  Result Date: 01/02/2023 CLINICAL DATA:  Staging of small cell lung cancer * Tracking Code: BO * EXAM: CT CHEST, ABDOMEN, AND PELVIS WITH CONTRAST TECHNIQUE: Multidetector CT imaging of the chest, abdomen and pelvis was performed following the standard protocol during bolus administration of intravenous contrast. RADIATION DOSE REDUCTION: This exam was performed according to the departmental dose-optimization program which includes automated exposure control, adjustment of the mA and/or kV according to patient size and/or use of iterative reconstruction technique. CONTRAST:  OMNIPAQUE IOHEXOL 300 MG/ML  SOLN COMPARISON:  09/18/2022 FINDINGS: CT CHEST FINDINGS Cardiovascular: Right Port-A-Cath tip: Cavoatrial junction. Coronary, aortic arch, and branch vessel atherosclerotic vascular disease. Unchanged chronic ulcerated plaque along the lateral transverse aorta. Mediastinum/Nodes: Densely calcified 1.1 cm right thyroid nodule, unchanged. Not clinically significant; no follow-up imaging recommended (ref: J Am Coll Radiol. 2015 Feb;12(2): 143-50).No pathologic adenopathy observed. Lungs/Pleura: Peripheral triangular right apical density measuring 3.9 by 2.1 by 3.4 cm on image 57 series 4, no change from 09/18/2022, corresponding to sequela of prior radiation therapy. Stable scarring in the posterior basal segments of both lower lobes. Musculoskeletal: Stable endplate compressions at T7, T8, and T9. CT ABDOMEN PELVIS FINDINGS Hepatobiliary: Unremarkable Pancreas: Unremarkable Spleen: Unremarkable Adrenals/Urinary Tract: Unremarkable Stomach/Bowel: Unremarkable Vascular/Lymphatic: Substantial atherosclerosis is present, including aortoiliac atherosclerotic disease. Atheromatous plaque noted at the origin of the celiac trunk without overt occlusion. There is some soft atheromatous plaque  proximally in the SMA, without occlusion. No pathologic adenopathy identified. Reproductive: Uterus absent.  Adnexa unremarkable. Other: No supplemental non-categorized findings. Musculoskeletal: Mild levoconvex lumbar scoliosis. Chronic superior endplate compression at L2. IMPRESSION: 1. No findings of active malignancy. 2. Stable peripheral triangular right apical density compatible with sequela from prior radiation therapy. 3. Stable scarring in the posterior basal segments of both lower lobes. 4. Stable chronic ulcerated plaque along the lateral transverse aorta. 5. Stable chronic endplate compressions at T7, T8, T9, and L2. 6. Aortic, coronary, and systemic atherosclerosis. Aortic Atherosclerosis (ICD10-I70.0). Electronically Signed   By: Gaylyn Rong M.D.   On: 01/02/2023 10:54      ASSESSMENT/PLAN:  This is a very pleasant 62 year old Caucasian female with extensive stage small cell lung cancer.  She presented with a right upper lobe lung mass, large right anterior mediastinal and supraclavicular lymphadenopathy as well as a pancreatic and splenic metastasis.  She was diagnosed in September 2017.    The patient underwent systemic chemotherapy with carboplatin and etoposide.  She is status post 6 cycles.  She tolerated treatment well except for chemotherapy-induced anemia which required  pRBCs and platelet transfusions.  She had a significant improvement of her disease with chemotherapy.  The patient then underwent prophylactic cranial irradiation. She then underwent stereotactic radiotherapy to the right upper lobe and pulmonary nodule.   She had been on observation for 2 years before showing evidence of disease progression.    She then was started on systemic chemotherapy with carboplatin, etoposide, and Tecentriq. Starting from cycle #5, she has been on maintenance single agent Tecentriq.  She has been tolerating treatment well. She is status post 74 cycles of Tecentriq total.   Labs were  reviewed. I will refill her liquid potassium. Recommend that she proceed with cycle #75 today as schedule.   We will see her back for follow-up visit in 3 weeks for evaluation repeat blood work before undergoing cycle #76   She is scheduled to see nutrition and palliative care today   We will check with the DME company about what the out of pocket cost would be for a wheelchair. It would be reasonable for her to have one since she is very deconditioned and cannot ambulate far due to left sided weakness due to prior stroke and COPD.    The patient was advised to call immediately if she has any concerning symptoms in the interval. The patient voices understanding of current disease status and treatment options and is in agreement with the current care plan. All questions were answered. The patient knows to call the clinic with any problems, questions or concerns. We can certainly see the patient much sooner if necessary     No orders of the defined types were placed in this encounter.    The total time spent in the appointment was 20-29 minutes  Tavaughn Silguero L Abrina Petz, PA-C 01/17/23

## 2023-01-20 NOTE — Progress Notes (Unsigned)
Palliative Medicine [facility]  [phone] [phone]) [phone]   Name: Debra Kidd Date: 01/20/2023 MRN: [id number]  DOB: 06/06/1961  Patient Care Team: Pcp, No as PCP - General    INTERVAL HISTORY: Debra Kidd is a 62 y.o. 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  Palliative ask to see for symptom management and goals of care.   SOCIAL HISTORY:     reports that she has been smoking cigarettes. She has a 86.00 pack-year smoking history. She has never used smokeless tobacco. She reports that she does not currently use alcohol. She reports current drug use. Frequency: 7.00 times per week. Drug: Marijuana.  ADVANCE DIRECTIVES:    CODE STATUS:   PAST MEDICAL HISTORY: Past Medical History:  Diagnosis Date   Basal cell carcinoma of cheek    L side of face   Blood type, Rh positive    Cancer (HCC)    Chronic pain syndrome    Depression 08/07/2016   Encounter for antineoplastic chemotherapy 07/17/2016   History of external beam radiation therapy 11/19/16-12/02/16   brain 25 Gy in 10 fractions   Hyperlipidemia    Hypertension    Hypertension 08/07/2016   Osteoporosis    Small cell lung cancer (HCC) dx'd 05/2016    ALLERGIES:  is allergic to codeine, motrin [ibuprofen], thiazide-type diuretics, and vicodin [hydrocodone-acetaminophen].  MEDICATIONS:  Current Outpatient Medications  Medication Sig Dispense Refill   atorvastatin (LIPITOR) 80 MG tablet Take 1 tablet (80 mg total) by mouth daily. 30 tablet 11   clopidogrel (PLAVIX) 75 MG tablet Take 1 tablet (75 mg total) by mouth daily. 90 tablet 3   diazepam (VALIUM) 5 MG tablet Take 1 tablet (5 mg total) by mouth every 12 (twelve) hours as needed for anxiety. 45 tablet 0   lidocaine-prilocaine (EMLA)  cream Apply 1 Application topically as needed. 30 g 2   oxyCODONE-acetaminophen (PERCOCET/ROXICET) 5-325 MG tablet Take 1 tablet by mouth every 6 (six) hours as needed for severe pain. 60 tablet 0   potassium chloride 20 MEQ/15ML (10%) SOLN Take 15 mLs (20 mEq total) by mouth 2 (two) times daily. 300 mL 0   Vitamin D, Ergocalciferol, [name],000 Units by mouth 3 (three) times a week.     No current facility-administered medications for this visit.    VITAL SIGNS: There were no vitals taken for this visit. There were no vitals filed for this visit.  Estimated body mass index is 17.58 kg/m as calculated from the following:   Height as of 01/02/23: 5\' 3"  (1.6 m).   Weight as of 01/02/23: 99 lb 4 oz (45 kg).   PERFORMANCE STATUS (ECOG) : 1 - Symptomatic but completely ambulatory  Assessment NAD, sitting up in recliner Normal breathing pattern RRR AAO x4  IMPRESSION:  I saw Debra Kidd during infusion. Some challenges today with hypotension and dizziness. Receiving IVF bolus. Denies any recent falls. Endorses unsteady gait at times. Feels much better since receiving fluids. Orthostatic vitals consistent 108/high 70s/80s.    Chronic pain Debra Kidd endorses pain being well controlled.  Some days are better than others.  Does endorse increase in pain and discomfort based on level of activity.   We reviewed patient's current regimen.  She is taking hydrocodone 5/325 as needed.  not requiring daily.   Will continue to closely monitor and adjust medications as needed.   Constipation Controlled with daily regimen.    Anxiety/Insomnia Controlled. Taking Valium as needed. Not requiring daily.   I discussed the importance of continued conversation with family and their medical providers regarding overall plan of care and treatment options, ensuring decisions are within the context of the patients values and GOCs.  PLAN:  Oxycodone 5/325 1 tablet  every 6 hours as needed for pain Valium 5 mg 1 tablet every 12 hours as needed for anxiety/insomnia (not taking regularly) Pain contract completed. Senna S daily for bowel regimen I will plan to see patient back in 3-4 weeks in collaboration to other oncology appointments   Patient expressed understanding and was in agreement with this plan. She also understands that She can call the clinic at any time with any questions, concerns, or complaints.   Any controlled substances utilized were prescribed in the context of palliative care. PDMP has been reviewed.    Visit consisted of counseling and education dealing with the complex and emotionally intense issues of symptom management and palliative care in the setting of serious and potentially life-threatening illness.Greater than 50%  of this time was spent counseling and coordinating care related to the above assessment and plan.  [name], AGPCNP-BC  Palliative Medicine Team/[facility]  *Please note that this is a verbal dictation therefore any spelling or grammatical errors are due to the "[employer]agon [employer] One" system interpretation.

## 2023-01-22 ENCOUNTER — Inpatient Hospital Stay (HOSPITAL_BASED_OUTPATIENT_CLINIC_OR_DEPARTMENT_OTHER): Payer: Medicare Other | Admitting: Physician Assistant

## 2023-01-22 ENCOUNTER — Other Ambulatory Visit: Payer: Self-pay | Admitting: Physician Assistant

## 2023-01-22 ENCOUNTER — Other Ambulatory Visit: Payer: Self-pay

## 2023-01-22 ENCOUNTER — Inpatient Hospital Stay: Payer: Medicare Other | Admitting: Dietician

## 2023-01-22 ENCOUNTER — Inpatient Hospital Stay: Payer: Medicare Other

## 2023-01-22 ENCOUNTER — Inpatient Hospital Stay (HOSPITAL_BASED_OUTPATIENT_CLINIC_OR_DEPARTMENT_OTHER): Payer: Medicare Other | Admitting: Nurse Practitioner

## 2023-01-22 ENCOUNTER — Encounter: Payer: Self-pay | Admitting: Nurse Practitioner

## 2023-01-22 VITALS — BP 95/66 | HR 73 | Resp 16

## 2023-01-22 VITALS — BP 103/67 | HR 72 | Temp 97.5°F | Resp 13 | Wt 101.1 lb

## 2023-01-22 DIAGNOSIS — C3491 Malignant neoplasm of unspecified part of right bronchus or lung: Secondary | ICD-10-CM

## 2023-01-22 DIAGNOSIS — I959 Hypotension, unspecified: Secondary | ICD-10-CM

## 2023-01-22 DIAGNOSIS — G4709 Other insomnia: Secondary | ICD-10-CM

## 2023-01-22 DIAGNOSIS — E876 Hypokalemia: Secondary | ICD-10-CM

## 2023-01-22 DIAGNOSIS — Z515 Encounter for palliative care: Secondary | ICD-10-CM

## 2023-01-22 DIAGNOSIS — R530 Neoplastic (malignant) related fatigue: Secondary | ICD-10-CM

## 2023-01-22 DIAGNOSIS — G893 Neoplasm related pain (acute) (chronic): Secondary | ICD-10-CM | POA: Diagnosis not present

## 2023-01-22 DIAGNOSIS — F419 Anxiety disorder, unspecified: Secondary | ICD-10-CM

## 2023-01-22 DIAGNOSIS — Z5112 Encounter for antineoplastic immunotherapy: Secondary | ICD-10-CM

## 2023-01-22 LAB — CBC WITH DIFFERENTIAL (CANCER CENTER ONLY)
Abs Immature Granulocytes: 0.01 10*3/uL (ref 0.00–0.07)
Basophils Absolute: 0 10*3/uL (ref 0.0–0.1)
Basophils Relative: 1 %
Eosinophils Absolute: 0.3 10*3/uL (ref 0.0–0.5)
Eosinophils Relative: 4 %
HCT: 31.9 % — ABNORMAL LOW (ref 36.0–46.0)
Hemoglobin: 11.4 g/dL — ABNORMAL LOW (ref 12.0–15.0)
Immature Granulocytes: 0 %
Lymphocytes Relative: 38 %
Lymphs Abs: 2.5 10*3/uL (ref 0.7–4.0)
MCH: 36.8 pg — ABNORMAL HIGH (ref 26.0–34.0)
MCHC: 35.7 g/dL (ref 30.0–36.0)
MCV: 102.9 fL — ABNORMAL HIGH (ref 80.0–100.0)
Monocytes Absolute: 0.4 10*3/uL (ref 0.1–1.0)
Monocytes Relative: 7 %
Neutro Abs: 3.4 10*3/uL (ref 1.7–7.7)
Neutrophils Relative %: 50 %
Platelet Count: 210 10*3/uL (ref 150–400)
RBC: 3.1 MIL/uL — ABNORMAL LOW (ref 3.87–5.11)
RDW: 12 % (ref 11.5–15.5)
Smear Review: NORMAL
WBC Count: 6.7 10*3/uL (ref 4.0–10.5)
nRBC: 0 % (ref 0.0–0.2)

## 2023-01-22 LAB — CMP (CANCER CENTER ONLY)
ALT: 13 U/L (ref 0–44)
AST: 20 U/L (ref 15–41)
Albumin: 3.6 g/dL (ref 3.5–5.0)
Alkaline Phosphatase: 101 U/L (ref 38–126)
Anion gap: 7 (ref 5–15)
BUN: 9 mg/dL (ref 8–23)
CO2: 27 mmol/L (ref 22–32)
Calcium: 8.2 mg/dL — ABNORMAL LOW (ref 8.9–10.3)
Chloride: 103 mmol/L (ref 98–111)
Creatinine: 0.74 mg/dL (ref 0.44–1.00)
GFR, Estimated: >60 mL/min (ref >60)
Glucose, Bld: 101 mg/dL — ABNORMAL HIGH (ref 70–99)
Potassium: 3 mmol/L — ABNORMAL LOW (ref 3.5–5.1)
Sodium: 137 mmol/L (ref 135–145)
Total Bilirubin: 0.6 mg/dL (ref 0.3–1.2)
Total Protein: 6 g/dL — ABNORMAL LOW (ref 6.5–8.1)

## 2023-01-22 MED ORDER — SODIUM CHLORIDE 0.9% FLUSH
10.0000 mL | INTRAVENOUS | Status: DC | PRN
  Administered 2023-01-22: 10 mL

## 2023-01-22 MED ORDER — SODIUM CHLORIDE 0.9 % IV SOLN
1200.0000 mg | Freq: Once | INTRAVENOUS | Status: AC
  Administered 2023-01-22: 1200 mg via INTRAVENOUS
  Filled 2023-01-22: qty 20

## 2023-01-22 MED ORDER — OXYCODONE-ACETAMINOPHEN 5-325 MG PO TABS
1.0000 | ORAL_TABLET | Freq: Four times a day (QID) | ORAL | 0 refills | Status: DC | PRN
Start: 2023-01-22 — End: 2023-02-11

## 2023-01-22 MED ORDER — SODIUM CHLORIDE 0.9 % IV SOLN: Freq: Once | INTRAVENOUS | Status: AC

## 2023-01-22 MED ORDER — POTASSIUM CHLORIDE 20 MEQ/15ML (10%) PO SOLN
20.0000 meq | Freq: Two times a day (BID) | ORAL | 0 refills | Status: DC
Start: 2023-01-22 — End: 2023-04-16

## 2023-01-22 MED ORDER — HEPARIN SOD (PORK) LOCK FLUSH 100 UNIT/ML IV SOLN
500.0000 [IU] | Freq: Once | INTRAVENOUS | Status: AC | PRN
  Administered 2023-01-22: 500 [IU]

## 2023-01-22 NOTE — Progress Notes (Signed)
Patient notified of potassium prescription called in to her pharmacy.  Patient verbalized understanding.  At end of infusion, patient's BP 89/70.  Patient c/o of minor lightheadedness, dizziness.  MD notified, 500 cc NS bolus ordered.  BP 95/66 at discharge, reported improvement in symptoms.  Patient assisted to lobby by wheelchair.

## 2023-01-22 NOTE — Patient Instructions (Addendum)
[facility]  Discharge Instructions: Thank you for choosing [facility] to provide your oncology and hematology care.   If you have a lab appointment with the Cancer Center, please go directly to the Cancer Center and check in at the registration area.   Wear comfortable clothing and clothing appropriate for easy access to any Portacath or PICC line.   We strive to give you quality time with your provider. You may need to reschedule your appointment if you arrive late (15 or more minutes).  Arriving late affects you and other patients whose appointments are after yours.  Also, if you miss three or more appointments without notifying the office, you may be dismissed from the clinic at the provider's discretion.      For prescription refill requests, have your pharmacy contact our office and allow 72 hours for refills to be completed.    Today you received the following chemotherapy and/or immunotherapy agents: Tecentriq      To help prevent nausea and vomiting after your treatment, we encourage you to take your nausea medication as directed.  BELOW ARE SYMPTOMS THAT SHOULD BE REPORTED IMMEDIATELY: *FEVER GREATER THAN 100.4 F (38 C) OR HIGHER *CHILLS OR SWEATING *NAUSEA AND VOMITING THAT IS NOT CONTROLLED WITH YOUR NAUSEA MEDICATION *UNUSUAL SHORTNESS OF BREATH *UNUSUAL BRUISING OR BLEEDING *URINARY PROBLEMS (pain or burning when urinating, or frequent urination) *BOWEL PROBLEMS (unusual diarrhea, constipation, pain near the anus) TENDERNESS IN MOUTH AND THROAT WITH OR WITHOUT PRESENCE OF ULCERS (sore throat, sores in mouth, or a toothache) UNUSUAL RASH, SWELLING OR PAIN  UNUSUAL VAGINAL DISCHARGE OR ITCHING   Items with * indicate a potential emergency and should be followed up as soon as possible or go to the Emergency Department if any problems should occur.  Please show the CHEMOTHERAPY ALERT CARD or IMMUNOTHERAPY ALERT CARD at  check-in to the Emergency Department and triage nurse.  Should you have questions after your visit or need to cancel or reschedule your appointment, please contact  [facility]  Dept: [phone]  and follow the prompts.  Office hours are 8:00 a.m. to 4:30 p.m. Monday - Friday. Please note that voicemails left after 4:00 p.m. may not be returned until the following business day.  We are closed weekends and major holidays. You have access to a nurse at all times for urgent questions. Please call the main number to the clinic Dept: [phone] and follow the prompts.   For any non-urgent questions, you may also contact your provider using MyChart. We now offer e-Visits for anyone 51 and older to request care online for non-urgent symptoms. For details visit [email or web address].   Also download the MyChart app! Go to the app store, search "MyChart", open the app, select , and log in with your MyChart username and password.  Hypokalemia Hypokalemia means that the amount of potassium in the blood is lower than normal. Potassium is a mineral (electrolyte) that helps regulate the amount of fluid in the body. It also stimulates muscle tightening (contraction) and helps nerves work properly. Normally, most of the body's potassium is inside cells, and only a very small amount is in the blood. Because the amount in the blood is so small, minor changes to potassium levels in the blood can be life-threatening. What are the causes? This condition may be caused by: Antibiotic medicine. Diarrhea or vomiting. Taking too much of a medicine that helps you have a  bowel movement (laxative) can cause diarrhea and lead to hypokalemia. Chronic kidney disease (CKD). Medicines that help the body get rid of excess fluid (diuretics). Eating disorders, such as anorexia or bulimia. Low magnesium levels in the body. Sweating a lot. What are the signs or symptoms? Symptoms  of this condition include: Weakness. Constipation. Fatigue. Muscle cramps. Mental confusion. Skipped heartbeats or irregular heartbeat (palpitations). Tingling or numbness. How is this diagnosed? This condition is diagnosed with a blood test. How is this treated? This condition may be treated by: Taking potassium supplements. Adjusting the medicines that you take. Eating more foods that contain a lot of potassium. If your potassium level is very low, you may need to get potassium through an IV and be monitored in the hospital. Follow these instructions at home: Eating and drinking  Eat a healthy diet. A healthy diet includes fresh fruits and vegetables, whole grains, healthy fats, and lean proteins. If told, eat more foods that contain a lot of potassium. These include: Nuts, such as peanuts and pistachios. Seeds, such as sunflower seeds and pumpkin seeds. Peas, lentils, and lima beans. Whole grain and bran cereals and breads. Fresh fruits and vegetables, such as apricots, avocado, bananas, cantaloupe, kiwi, oranges, tomatoes, asparagus, and potatoes. Juices, such as orange, tomato, and prune. Lean meats, including fish. Milk and milk products, such as yogurt. General instructions Take over-the-counter and prescription medicines only as told by your health care provider. This includes vitamins, natural food products, and supplements. Keep all follow-up visits. This is important. Contact a health care provider if: You have weakness that gets worse. You feel your heart pounding or racing. You vomit. You have diarrhea. You have diabetes and you have trouble keeping your blood sugar in your target range. Get help right away if: You have chest pain. You have shortness of breath. You have vomiting or diarrhea that lasts for more than 2 days. You faint. These symptoms may be an emergency. Get help right away. Call 911. Do not wait to see if the symptoms will go away. Do not  drive yourself to the hospital. Summary Hypokalemia means that the amount of potassium in the blood is lower than normal. This condition is diagnosed with a blood test. Hypokalemia may be treated by taking potassium supplements, adjusting the medicines that you take, or eating more foods that are high in potassium. If your potassium level is very low, you may need to get potassium through an IV and be monitored in the hospital. This information is not intended to replace advice given to you by your health care provider. Make sure you discuss any questions you have with your health care provider. Document Revised: 05/03/2021 Document Reviewed: 05/03/2021 Elsevier Patient Education  2023 [employer]  Dehydration, Adult Dehydration is a condition in which there is not enough water or other fluids in the body. This happens when a person loses more fluids than they take in. Important organs, such as the kidneys, brain, and heart, cannot function without a proper amount of fluids. Any loss of fluids from the body can lead to dehydration. Dehydration can be mild, moderate, or severe. It should be treated right away to prevent it from becoming severe. What are the causes? Dehydration may be caused by: Health conditions, such as diarrhea, vomiting, fever, infection, or sweating or urinating a lot. Not drinking enough fluids. Certain medicines, such as medicines that remove excess fluid from the body (diuretics). Lack of safe drinking water. Not being able to get enough  water and food. What increases the risk? The following factors may make you more likely to develop this condition: Having a long-term (chronic) illness that has not been treated properly, such as diabetes, heart disease, or kidney disease. Being 64 years of age or older. Having a disability. Living in a place that is high in altitude, where thinner, drier air causes more fluid loss. Doing exercises that put stress on your body for  a long time (endurance sports). Being active in a hot climate. What are the signs or symptoms? Symptoms of dehydration depend on how severe it is. Mild or moderate dehydration Thirst. Dry lips or dry mouth. Dizziness or light-headedness. Muscle cramps. Dark urine. Urine may be the color of tea. Less urine or tears produced than usual. Headache. Severe dehydration Changes in skin. Your skin may be cold and clammy, blotchy, or pale. Your skin also may not return to normal after being lightly pinched and released. Little or no tears, urine, or sweat. Rapid breathing and low blood pressure. Your pulse may be weak or may be faster than 100 beats per minute when you are sitting still. Other changes, such as: Feeling very thirsty. Sunken eyes. Cold hands and feet. Confusion. Being very tired (lethargic) or having trouble waking from sleep. Short-term weight loss. Loss of consciousness. How is this diagnosed? This condition is diagnosed based on your symptoms and a physical exam. You may have blood and urine tests to help confirm the diagnosis. How is this treated? Treatment for this condition depends on how severe it is. Treatment should be started right away. Do not wait until dehydration becomes severe. Severe dehydration is an emergency and needs to be treated in a hospital. Mild or moderate dehydration can be treated at home. You may be asked to: Drink more fluids. Drink an oral rehydration solution (ORS). This drink restores fluids, salts, and minerals in the blood (electrolytes). Stop any activities that caused dehydration, such as exercise. Cool off with cool compresses, cool mist, or cool fluids, if heat or too much sweat caused your condition. Take medicine to treat fever, if fever caused your condition. Take medicine to treat nausea and diarrhea, if vomiting or diarrhea caused your condition. Severe dehydration can be treated: With IV fluids. By correcting abnormal levels of  electrolytes in your body. By treating the underlying cause of dehydration. Follow these instructions at home: Oral rehydration solution If told by your health care provider, drink an ORS: Make an ORS by following instructions on the package. Start by drinking small amounts, about  cup (120 mL) every 5-10 minutes. Slowly increase how much you drink until you have taken the amount recommended by your health care provider.  Eating and drinking  Drink enough clear fluid to keep your urine pale yellow. If you were told to drink an ORS, finish the ORS first and then start slowly drinking other clear fluids. Drink fluids such as: Water. Do not drink only water. Doing that can lead to hyponatremia, which is having too little salt (sodium) in the body. Water from ice chips you suck on. Diluted fruit juice. This is fruit juice that you have added water to. Low-calorie sports drinks. Eat foods that contain a healthy balance of electrolytes, such as bananas, oranges, potatoes, tomatoes, and spinach. Do not drink alcohol. Avoid the following: Drinks that contain a lot of sugar. These include high-calorie sports drinks, fruit juice that is not diluted, and soda. Caffeine. Foods that are greasy or contain a lot of fat  or sugar. General instructions Take over-the-counter and prescription medicines only as told by your health care provider. Do not take sodium tablets. Doing that can lead to having too much sodium in the body (hypernatremia). Return to your normal activities as told by your health care provider. Ask your health care provider what activities are safe for you. Keep all follow-up visits. Your health care provider may need to check your progress and suggest new ways to treat your condition. Contact a health care provider if: You have muscle cramps, pain, or discomfort, such as: Pain in your abdomen and the pain gets worse or stays in one area. Stiff neck. You have a rash. You are more  irritable than usual. You are sleepier or have a harder time waking. You feel weak or dizzy. You feel very thirsty. Get help right away if: You have symptoms of severe dehydration. You vomit every time you eat or drink. Your vomiting gets worse, does not go away, or includes blood or green matter (bile). You are getting treatment but symptoms are getting worse. You have a fever. You have a severe headache. You have: Diarrhea that gets worse or does not go away. Blood in your stool. This may cause stool to look black and tarry. Not urinating, or urinating only a small amount of very dark urine, within 6-8 hours. You have trouble breathing. These symptoms may be an emergency. Get help right away. Do not wait to see if the symptoms will go away. Do not drive yourself to the hospital. Call 911. This information is not intended to replace advice given to you by your health care provider. Make sure you discuss any questions you have with your health care provider. Document Revised: 03/18/2022 Document Reviewed: 03/18/2022 Elsevier Patient Education  2023 [employer].

## 2023-01-22 NOTE — Progress Notes (Signed)
Nutrition Follow-up:  Patient with extensive stage small cell lung cancer. She is currently receiving maintenance treatment with single agent Tecentriq 1200 mg IV every 3 weeks.    Met with patient in infusion. She reports appetite has been great. Patient feels like she eats all the time. She has been enjoying cake recently. Patient recalls carrot cake, strawberry shortcake, coconut cake. Patient including good sources of daily protein (chicken and dumplings, pork chops, steak). She is drinking one Ensure as well as chocolate milk. She denies nausea, vomiting, diarrhea, constipation.   Medications: reviewed   Labs: K 3.0  Anthropometrics: Wt 101 lb 1.6 oz today increased   5/2 - 99 lb 4 oz  4/11 - 100 lb 11.2 oz   NUTRITION DIAGNOSIS: Unintended weight loss stable   INTERVENTION:  Continue high calorie high protein foods to promote weight gain Continue drinking Ensure Plus/equivalent  One case Ensure Plus HP + bag of food from [employer] provided     MONITORING, EVALUATION, GOAL: weight trends, intake   NEXT VISIT: Wednesday July 3 during infusion

## 2023-01-29 ENCOUNTER — Telehealth: Payer: Self-pay | Admitting: Internal Medicine

## 2023-01-29 NOTE — Telephone Encounter (Signed)
Rescheduled 06/12 appointment to 06/11 due to provider pal, patient has been called and notified.

## 2023-02-07 NOTE — Progress Notes (Unsigned)
[facility] OFFICE PROGRESS NOTE  Pcp, [name], N3, [name], [name], 2 and 3 with Neulasta support. Status post 6 cycles with significant response of her disease. 2) Prophylactic cranial irradiation under the care of Dr. Roselind Messier on 12/02/2016. 3) stereotactic radiotherapy to the recurrent right upper lobe pulmonary nodule under the care of Dr. Roselind Messier completed November 10, 2017. 4) Retreatment with systemic chemotherapy with carboplatin for AUC of 5 on day 1 and etoposide 100 mg/M2 on days 1, 2 and 3 as well as Tecentriq (Atezolizumab) 1200 mg IV every 3 weeks with Neulasta support.  First dose June 22, 2018 for disease recurrence.  Status post 5 cycles.  Starting from cycle #2 her dose of carboplatin will be reduced to AUC of 4 and etoposide 80 mg/M2 on days 1, 2 and 3 in addition to the regular dose of Tecentriq.  CURRENT THERAPY: Maintenance treatment with single agent Tecentriq 1200 mg IV every 3 weeks.  Status post 75 cycles.    INTERVAL HISTORY: Debra Kidd 62 y.o. female returns to the clinic today for a follow-up visit.  The patient is currently being followed for metastatic small cell lung cancer for several years for which she has been on maintenance Tecentriq IV every 3 weeks.  She does tolerate this well except for dry skin and itching. She uses hydrocortisone cream. The itching is mostly on her back. Overall though, she is not in good health and is very deconditioned at baseline due to comorbidities and poor nutrition and not being very active. She was last seen by  myself 3 weeks ago.   The patient reports baseline persistent fatigue and generalized weakness today. She is followed closely by social work and nutrition.  She is scheduled to see palliative care today and nutrition at her next appointment. She often receives complementary food items and supplemental protein drinks in our clinic to take home   She denies any fever or night sweats. She reports she is cold natured and denies changes. She had some nausea a few nights ago because she states she did not eat enough when she took her pain medication. She reports her baseline dyspnea on exertion which is stable without any chest pain or hemoptysis.  She reports a baseline mild cough and she continues to smoke approximately 2 packs of cigarettes per day. She has intermittent diarrhea averaging about once every other day. She states she received paperwork to reapply for the [employer].  However she is not sure where to bring this paperwork.  She is here for evaluation and repeat blood work before starting cycle #76.       MEDICAL HISTORY: Past Medical History:  Diagnosis Date   Basal cell carcinoma of cheek    L side of face   Blood type, Rh positive    Cancer (HCC)    Chronic pain syndrome    Depression 08/07/2016   Encounter for antineoplastic chemotherapy 07/17/2016   History of external beam radiation therapy 11/19/16-12/02/16   brain 25 Gy in 10 fractions   Hyperlipidemia    Hypertension  Hypertension 08/07/2016   Osteoporosis    Small cell lung cancer (HCC) dx'd 05/2016    ALLERGIES:  is allergic to codeine, motrin [ibuprofen], thiazide-type diuretics, and vicodin [hydrocodone-acetaminophen].  MEDICATIONS:  Current Outpatient Medications  Medication Sig Dispense Refill   triamcinolone cream (KENALOG) 0.1 % Apply 1 Application topically 2 (two) times daily. 80 g 2   atorvastatin (LIPITOR) 80 MG tablet Take 1 tablet (80 mg total) by mouth daily. 30 tablet 11   clopidogrel (PLAVIX) 75  MG tablet Take 1 tablet (75 mg total) by mouth daily. 90 tablet 3   diazepam (VALIUM) 5 MG tablet Take 1 tablet (5 mg total) by mouth every 12 (twelve) hours as needed for anxiety. 45 tablet 0   lidocaine-prilocaine (EMLA) cream Apply 1 Application topically as needed. 30 g 2   oxyCODONE-acetaminophen (PERCOCET/ROXICET) 5-325 MG tablet Take 1 tablet by mouth every 6 (six) hours as needed for severe pain. 60 tablet 0   potassium chloride 20 MEQ/15ML (10%) SOLN Take 15 mLs (20 mEq total) by mouth 2 (two) times daily. 210 mL 0   Vitamin D, Ergocalciferol, [name],000 Units by mouth 3 (three) times a week.     No current facility-administered medications for this visit.    SURGICAL HISTORY:  Past Surgical History:  Procedure Laterality Date   ABDOMINAL HYSTERECTOMY  1981   APPENDECTOMY     at age 18   EYE SURGERY     left   IR GENERIC HISTORICAL  06/03/2016   IR FLUORO GUIDE PORT INSERTION RIGHT 06/03/2016 [facility]   IR GENERIC HISTORICAL  06/03/2016   IR US GUIDE VASC ACCESS RIGHT 06/03/2016 [facility]   SKIN CANCER EXCISION     basal cell carcinoma L side of face    REVIEW OF SYSTEMS:   Review of Systems  Constitutional: Positive for stable decreased appetite and fatigue. Negative for  chills, fever and unexpected weight change.  HENT: Negative for mouth sores, nosebleeds, sore throat and trouble swallowing.  Eyes: Negative for eye problems and icterus.  Respiratory: Positive for stable SOB and mild cough which are her baseline. Negative for hemoptysis, and wheezing.   Cardiovascular: Negative for chest pain and leg swelling.  Gastrointestinal:  Negative for abdominal pain, recent diarrhea, constipation,  nausea and vomiting.  Genitourinary: Negative for bladder incontinence, difficulty urinating, dysuria, frequency and hematuria.   Musculoskeletal: Positive for chronic back pain.  Positive for improved left upper extremity pain.   Negative for gait problem, neck pain and neck stiffness.  Skin: Positive for occasional itching. Negative for rash.  Neurological: Positive for generalized weakness.  Positive for left-sided weakness due to her history of stroke.  Negative for extremity weakness, gait problem, [name], depression and sleep disturbance. The patient is not nervous/anxious   PHYSICAL EXAMINATION:  Blood pressure 121/86, pulse 73, temperature 98.5 F (36.9 C), temperature source Tympanic, weight 99 lb 12.8 oz (45.3 kg), SpO2 100 %.  ECOG PERFORMANCE STATUS: 2-3  Physical Exam  Constitutional: Oriented to person, place, and time and chronically ill appearing female appears older than stated age and in no distress. HENT: Head: Normocephalic and atraumatic. Mouth/Throat: Oropharynx is clear and moist. No oropharyngeal exudate. Eyes: Conjunctivae are normal. Right eye exhibits no discharge. Left eye exhibits no discharge. No scleral icterus. Neck: Normal range of motion. Neck supple. Cardiovascular: Normal rate, regular rhythm, normal  heart sounds and intact distal pulses.   Pulmonary/Chest: Effort normal.  Quiet breath sounds in all lung fields.  No respiratory distress. No wheezes. No rales. Abdominal: Soft. Bowel sounds are normal. Exhibits no distension and no mass. There is no tenderness.  Musculoskeletal: She has limited range of motion in her left shoulder secondary to pain.  Hands are well-perfused and warm bilaterally.  Exhibits no edema.  Newness to palpation over the lateral aspect of the upper humerus. Lymphadenopathy:    slightly enlarged symmetric left and right upper cervical lymph node.  Neurological: Alert and oriented to person, place, and time. Exhibits muscle wasting. Examined in the wheelchair. Chronic left sided weakness due to hx of CVA.  Skin: Skin is  warm and dry. No rash noted. Not diaphoretic. No erythema. No pallor.  Psychiatric: Mood, memory and judgment normal. Vitals reviewed.  LABORATORY DATA: Lab Results  Component Value Date   WBC 6.0 02/11/2023   HGB 12.1 02/11/2023   HCT 35.9 (L) 02/11/2023   MCV 104.4 (H) 02/11/2023   PLT 230 02/11/2023      Chemistry      Component Value Date/Time   NA 137 02/11/2023 0751   NA 141 08/01/2017 0835   K 3.7 02/11/2023 0751   K 3.5 08/01/2017 0835   CL 105 02/11/2023 0751   CO2 27 02/11/2023 0751   CO2 25 08/01/2017 0835   BUN 7 (L) 02/11/2023 0751   BUN 8.0 08/01/2017 0835   CREATININE 0.73 02/11/2023 0751   CREATININE 0.9 08/01/2017 0835      Component Value Date/Time   CALCIUM 8.8 (L) 02/11/2023 0751   CALCIUM 9.9 08/01/2017 0835   ALKPHOS 102 02/11/2023 0751   ALKPHOS 142 08/01/2017 0835   AST 20 02/11/2023 0751   AST 15 08/01/2017 0835   ALT 13 02/11/2023 0751   ALT 19 08/01/2017 0835   BILITOT 0.5 02/11/2023 0751   BILITOT 0.35 08/01/2017 0835       RADIOGRAPHIC STUDIES:  No results found.   ASSESSMENT/PLAN:  This is a very pleasant 62 year old Caucasian female with extensive stage small cell lung cancer.  She presented with a right upper lobe lung mass, large right anterior mediastinal and supraclavicular lymphadenopathy as well as a pancreatic and splenic metastasis.  She was diagnosed in September 2017.    The patient underwent systemic chemotherapy with carboplatin and etoposide.  She is status post 6 cycles.  She tolerated treatment well except for chemotherapy-induced anemia which required  pRBCs and platelet transfusions.  She had a significant improvement of her disease with chemotherapy. The patient then underwent prophylactic cranial irradiation. She then underwent stereotactic radiotherapy to the right upper lobe and pulmonary nodule.   She had been on observation for 2 years before showing evidence of disease progression.    She then was started  on systemic chemotherapy with carboplatin, etoposide, and Tecentriq. Starting from cycle #5, she has been on maintenance single agent Tecentriq.  She has been tolerating treatment well. She is status post 75 cycles of Tecentriq total.   Labs were reviewed. Recommend that she proceed with cycle #76 today as schedule.   She is scheduled to see palliative care today.  She is scheduled to see nutrition at her next appointment.  I sent her prescription for Kenalog cream for her itching on her back likely secondary to her immunotherapy.  The patient was advised to call immediately if she has any concerning symptoms in the interval. The patient voices understanding of current  disease status and treatment options and is in agreement with the current care plan. All questions were answered. The patient knows to call the clinic with any problems, questions or concerns. We can certainly see the patient much sooner if necessary         Orders Placed This Encounter  Procedures   CBC with Differential (Cancer Center Only)    Standing Status:   Future    Standing Expiration Date:   02/12/2024   CMP (Cancer Center only)    Standing Status:   Future    Standing Expiration Date:   02/12/2024   CBC with Differential (Cancer Center Only)    Standing Status:   Future    Standing Expiration Date:   03/04/2024   CMP (Cancer Center only)    Standing Status:   Future    Standing Expiration Date:   03/04/2024   CBC with Differential (Cancer Center Only)    Standing Status:   Future    Standing Expiration Date:   03/25/2024   CMP (Cancer Center only)    Standing Status:   Future    Standing Expiration Date:   03/25/2024   CBC with Differential (Cancer Center Only)    Standing Status:   Future    Standing Expiration Date:   04/15/2024   CMP (Cancer Center only)    Standing Status:   Future    Standing Expiration Date:   04/15/2024   CBC with Differential (Cancer Center Only)    Standing Status:   Future     Standing Expiration Date:   05/06/2024   CMP (Cancer Center only)    Standing Status:   Future    Standing Expiration Date:   05/06/2024   CBC with Differential (Cancer Center Only)    Standing Status:   Future    Standing Expiration Date:   05/27/2024   CMP (Cancer Center only)    Standing Status:   Future    Standing Expiration Date:   05/27/2024   CBC with Differential (Cancer Center Only)    Standing Status:   Future    Standing Expiration Date:   06/17/2024   CMP (Cancer Center only)    Standing Status:   Future    Standing Expiration Date:   06/17/2024   CBC with Differential (Cancer Center Only)    Standing Status:   Future    Standing Expiration Date:   07/08/2024   CMP (Cancer Center only)    Standing Status:   Future    Standing Expiration Date:   07/08/2024     The total time spent in the appointment was 20-29 minutes  Debra Level L Shakerra Red, PA-C 02/11/23

## 2023-02-10 ENCOUNTER — Other Ambulatory Visit: Payer: Self-pay | Admitting: Physician Assistant

## 2023-02-10 DIAGNOSIS — C3491 Malignant neoplasm of unspecified part of right bronchus or lung: Secondary | ICD-10-CM

## 2023-02-10 DIAGNOSIS — R5383 Other fatigue: Secondary | ICD-10-CM

## 2023-02-11 ENCOUNTER — Inpatient Hospital Stay (HOSPITAL_BASED_OUTPATIENT_CLINIC_OR_DEPARTMENT_OTHER): Payer: Medicare Other | Admitting: Nurse Practitioner

## 2023-02-11 ENCOUNTER — Ambulatory Visit: Payer: Medicare Other | Admitting: Internal Medicine

## 2023-02-11 ENCOUNTER — Inpatient Hospital Stay (HOSPITAL_BASED_OUTPATIENT_CLINIC_OR_DEPARTMENT_OTHER): Payer: Medicare Other | Admitting: Physician Assistant

## 2023-02-11 ENCOUNTER — Encounter: Payer: Self-pay | Admitting: Nurse Practitioner

## 2023-02-11 ENCOUNTER — Inpatient Hospital Stay: Payer: Medicare Other

## 2023-02-11 ENCOUNTER — Inpatient Hospital Stay: Payer: Medicare Other | Attending: Internal Medicine

## 2023-02-11 VITALS — BP 121/86 | HR 73 | Temp 98.5°F | Wt 99.8 lb

## 2023-02-11 VITALS — BP 111/73 | HR 71

## 2023-02-11 DIAGNOSIS — R059 Cough, unspecified: Secondary | ICD-10-CM | POA: Diagnosis not present

## 2023-02-11 DIAGNOSIS — T451X5A Adverse effect of antineoplastic and immunosuppressive drugs, initial encounter: Secondary | ICD-10-CM | POA: Insufficient documentation

## 2023-02-11 DIAGNOSIS — C3491 Malignant neoplasm of unspecified part of right bronchus or lung: Secondary | ICD-10-CM

## 2023-02-11 DIAGNOSIS — R5383 Other fatigue: Secondary | ICD-10-CM | POA: Diagnosis not present

## 2023-02-11 DIAGNOSIS — I10 Essential (primary) hypertension: Secondary | ICD-10-CM | POA: Diagnosis not present

## 2023-02-11 DIAGNOSIS — Z7902 Long term (current) use of antithrombotics/antiplatelets: Secondary | ICD-10-CM | POA: Diagnosis not present

## 2023-02-11 DIAGNOSIS — E785 Hyperlipidemia, unspecified: Secondary | ICD-10-CM | POA: Diagnosis not present

## 2023-02-11 DIAGNOSIS — F419 Anxiety disorder, unspecified: Secondary | ICD-10-CM

## 2023-02-11 DIAGNOSIS — F17210 Nicotine dependence, cigarettes, uncomplicated: Secondary | ICD-10-CM | POA: Diagnosis not present

## 2023-02-11 DIAGNOSIS — L299 Pruritus, unspecified: Secondary | ICD-10-CM

## 2023-02-11 DIAGNOSIS — G894 Chronic pain syndrome: Secondary | ICD-10-CM | POA: Diagnosis not present

## 2023-02-11 DIAGNOSIS — Z5112 Encounter for antineoplastic immunotherapy: Secondary | ICD-10-CM | POA: Diagnosis present

## 2023-02-11 DIAGNOSIS — Z515 Encounter for palliative care: Secondary | ICD-10-CM

## 2023-02-11 DIAGNOSIS — R110 Nausea: Secondary | ICD-10-CM | POA: Diagnosis not present

## 2023-02-11 DIAGNOSIS — G893 Neoplasm related pain (acute) (chronic): Secondary | ICD-10-CM | POA: Diagnosis not present

## 2023-02-11 DIAGNOSIS — D6481 Anemia due to antineoplastic chemotherapy: Secondary | ICD-10-CM | POA: Diagnosis not present

## 2023-02-11 DIAGNOSIS — R197 Diarrhea, unspecified: Secondary | ICD-10-CM | POA: Diagnosis not present

## 2023-02-11 DIAGNOSIS — M810 Age-related osteoporosis without current pathological fracture: Secondary | ICD-10-CM | POA: Diagnosis not present

## 2023-02-11 DIAGNOSIS — C3411 Malignant neoplasm of upper lobe, right bronchus or lung: Secondary | ICD-10-CM | POA: Diagnosis not present

## 2023-02-11 DIAGNOSIS — C7889 Secondary malignant neoplasm of other digestive organs: Secondary | ICD-10-CM | POA: Diagnosis not present

## 2023-02-11 DIAGNOSIS — Z79899 Other long term (current) drug therapy: Secondary | ICD-10-CM | POA: Diagnosis not present

## 2023-02-11 DIAGNOSIS — Z85828 Personal history of other malignant neoplasm of skin: Secondary | ICD-10-CM | POA: Diagnosis not present

## 2023-02-11 LAB — CMP (CANCER CENTER ONLY)
ALT: 13 U/L (ref 0–44)
AST: 20 U/L (ref 15–41)
Albumin: 3.8 g/dL (ref 3.5–5.0)
Alkaline Phosphatase: 102 U/L (ref 38–126)
Anion gap: 5 (ref 5–15)
BUN: 7 mg/dL — ABNORMAL LOW (ref 8–23)
CO2: 27 mmol/L (ref 22–32)
Calcium: 8.8 mg/dL — ABNORMAL LOW (ref 8.9–10.3)
Chloride: 105 mmol/L (ref 98–111)
Creatinine: 0.73 mg/dL (ref 0.44–1.00)
GFR, Estimated: >60 mL/min (ref >60)
Glucose, Bld: 107 mg/dL — ABNORMAL HIGH (ref 70–99)
Potassium: 3.7 mmol/L (ref 3.5–5.1)
Sodium: 137 mmol/L (ref 135–145)
Total Bilirubin: 0.5 mg/dL (ref 0.3–1.2)
Total Protein: 6.5 g/dL (ref 6.5–8.1)

## 2023-02-11 LAB — CBC WITH DIFFERENTIAL (CANCER CENTER ONLY)
Abs Immature Granulocytes: 0.02 10*3/uL (ref 0.00–0.07)
Basophils Absolute: 0 10*3/uL (ref 0.0–0.1)
Basophils Relative: 1 %
Eosinophils Absolute: 0.3 10*3/uL (ref 0.0–0.5)
Eosinophils Relative: 4 %
HCT: 35.9 % — ABNORMAL LOW (ref 36.0–46.0)
Hemoglobin: 12.1 g/dL (ref 12.0–15.0)
Immature Granulocytes: 0 %
Lymphocytes Relative: 37 %
Lymphs Abs: 2.2 10*3/uL (ref 0.7–4.0)
MCH: 35.2 pg — ABNORMAL HIGH (ref 26.0–34.0)
MCHC: 33.7 g/dL (ref 30.0–36.0)
MCV: 104.4 fL — ABNORMAL HIGH (ref 80.0–100.0)
Monocytes Absolute: 0.4 10*3/uL (ref 0.1–1.0)
Monocytes Relative: 6 %
Neutro Abs: 3.1 10*3/uL (ref 1.7–7.7)
Neutrophils Relative %: 52 %
Platelet Count: 230 10*3/uL (ref 150–400)
RBC: 3.44 MIL/uL — ABNORMAL LOW (ref 3.87–5.11)
RDW: 12.3 % (ref 11.5–15.5)
WBC Count: 6 10*3/uL (ref 4.0–10.5)
nRBC: 0 % (ref 0.0–0.2)

## 2023-02-11 LAB — TSH: TSH: 2.358 u[IU]/mL (ref 0.350–4.500)

## 2023-02-11 MED ORDER — OXYCODONE-ACETAMINOPHEN 5-325 MG PO TABS
1.0000 | ORAL_TABLET | Freq: Four times a day (QID) | ORAL | 0 refills | Status: DC | PRN
Start: 2023-02-11 — End: 2023-07-29

## 2023-02-11 MED ORDER — TRIAMCINOLONE ACETONIDE 0.1 % EX CREA
1.0000 | TOPICAL_CREAM | Freq: Two times a day (BID) | CUTANEOUS | 2 refills | Status: DC
Start: 2023-02-11 — End: 2023-11-26

## 2023-02-11 MED ORDER — SODIUM CHLORIDE 0.9% FLUSH
10.0000 mL | INTRAVENOUS | Status: DC | PRN
  Administered 2023-02-11: 10 mL

## 2023-02-11 MED ORDER — DIAZEPAM 5 MG PO TABS
5.0000 mg | ORAL_TABLET | Freq: Two times a day (BID) | ORAL | 0 refills | Status: DC | PRN
Start: 2023-02-11 — End: 2023-03-05

## 2023-02-11 MED ORDER — SODIUM CHLORIDE 0.9 % IV SOLN
1200.0000 mg | Freq: Once | INTRAVENOUS | Status: AC
  Administered 2023-02-11: 1200 mg via INTRAVENOUS
  Filled 2023-02-11: qty 20

## 2023-02-11 MED ORDER — SODIUM CHLORIDE 0.9 % IV SOLN: Freq: Once | INTRAVENOUS | Status: AC

## 2023-02-11 MED ORDER — HEPARIN SOD (PORK) LOCK FLUSH 100 UNIT/ML IV SOLN
500.0000 [IU] | Freq: Once | INTRAVENOUS | Status: AC | PRN
  Administered 2023-02-11: 500 [IU]

## 2023-02-11 NOTE — Progress Notes (Signed)
Palliative Medicine [facility]  [phone]) [phone] [phone]   Name: Debra Kidd Date: 02/11/2023 MRN: [id number]  DOB: [birth year]  Patient Care Team: Pcp, No as PCP - General    INTERVAL HISTORY: Debra Kidd is a 62 y.o. 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  Palliative ask to see for symptom management and goals of care.   SOCIAL HISTORY:     reports that she has been smoking cigarettes. She has a 86.00 pack-year smoking history. She has never used smokeless tobacco. She reports that she does not currently use alcohol. She reports current drug use. Frequency: 7.00 times per week. Drug: Marijuana.  ADVANCE DIRECTIVES:    CODE STATUS:   PAST MEDICAL HISTORY: Past Medical History:  Diagnosis Date   Basal cell carcinoma of cheek    L side of face   Blood type, Rh positive    Cancer (HCC)    Chronic pain syndrome    Depression 08/07/2016   Encounter for antineoplastic chemotherapy 07/17/2016   History of external beam radiation therapy 11/19/16-12/02/16   brain 25 Gy in 10 fractions   Hyperlipidemia    Hypertension    Hypertension 08/07/2016   Osteoporosis    Small cell lung cancer (HCC) dx'd 05/2016    ALLERGIES:  is allergic to codeine, motrin [ibuprofen], thiazide-type diuretics, and vicodin [hydrocodone-acetaminophen].  MEDICATIONS:  Current Outpatient Medications  Medication Sig Dispense Refill   atorvastatin (LIPITOR) 80 MG tablet Take 1 tablet (80 mg total) by mouth daily. 30 tablet 11   clopidogrel (PLAVIX) 75 MG tablet Take 1 tablet (75 mg total) by mouth daily. 90 tablet 3   diazepam (VALIUM) 5 MG tablet Take 1 tablet (5 mg total) by mouth every 12 (twelve) hours as needed for anxiety. 45 tablet 0   lidocaine-prilocaine (EMLA)  cream Apply 1 Application topically as needed. 30 g 2   oxyCODONE-acetaminophen (PERCOCET/ROXICET) 5-325 MG tablet Take 1 tablet by mouth every 6 (six) hours as needed for severe pain. 60 tablet 0   potassium chloride 20 MEQ/15ML (10%) SOLN Take 15 mLs (20 mEq total) by mouth 2 (two) times daily. 210 mL 0   Vitamin D, Ergocalciferol, [name],000 Units by mouth 3 (three) times a week.     No current facility-administered medications for this visit.    VITAL SIGNS: There were no vitals taken for this visit. There were no vitals filed for this visit.  Estimated body mass index is 17.91 kg/m as calculated from the following:   Height as of 01/02/23: 5\' 3"  (1.6 m).   Weight as of 01/22/23: 101 lb 1.6 oz (45.9 kg).   PERFORMANCE STATUS (ECOG) : 1 - Symptomatic but completely ambulatory  Assessment NAD, in wheelchair  Normal breathing pattern RRR AAO x4  IMPRESSION: Debra Kidd presents to clinic today for follow-up. No acute distress. Her husband is present with her today. Denies nausea, vomiting, constipation, or diarrhea.  Is trying to remain as active as possible.  Tolerating treatments.  Chronic pain Debra Kidd endorses pain being well controlled.  Some days are better than others.  Does endorse increase in pain and discomfort based on level of activity.   We reviewed patient's current regimen.  She is taking hydrocodone 5/325 as needed.  not requiring daily.  Will continue to closely monitor and adjust medications as needed.   Constipation Controlled with daily regimen.    Anxiety/Insomnia Controlled. Taking Valium as needed. Not requiring daily.   I discussed the importance of continued conversation with family and their medical providers regarding overall plan of care and treatment options, ensuring decisions are within the context of the patients values and GOCs.  PLAN:  Oxycodone 5/325 1 tablet every 6 hours as needed for  pain Valium 5 mg 1 tablet every 12 hours as needed for anxiety/insomnia (not taking regularly) Pain contract completed. Senna S daily for bowel regimen I will plan to see patient back in 3-4 weeks in collaboration to other oncology appointments   Patient expressed understanding and was in agreement with this plan. She also understands that She can call the clinic at any time with any questions, concerns, or complaints.   Any controlled substances utilized were prescribed in the context of palliative care. PDMP has been reviewed.    Visit consisted of counseling and education dealing with the complex and emotionally intense issues of symptom management and palliative care in the setting of serious and potentially life-threatening illness.Greater than 50%  of this time was spent counseling and coordinating care related to the above assessment and plan.  [name], AGPCNP-BC  Palliative Medicine Team/[facility]  *Please note that this is a verbal dictation therefore any spelling or grammatical errors are due to the "[employer] One" system interpretation.

## 2023-02-11 NOTE — Patient Instructions (Signed)
[facility]  Discharge Instructions: Thank you for choosing [facility] to provide your oncology and hematology care.   If you have a lab appointment with the Cancer Center, please go directly to the Cancer Center and check in at the registration area.   Wear comfortable clothing and clothing appropriate for easy access to any Portacath or PICC line.   We strive to give you quality time with your provider. You may need to reschedule your appointment if you arrive late (15 or more minutes).  Arriving late affects you and other patients whose appointments are after yours.  Also, if you miss three or more appointments without notifying the office, you may be dismissed from the clinic at the provider's discretion.      For prescription refill requests, have your pharmacy contact our office and allow 72 hours for refills to be completed.    Today you received the following chemotherapy and/or immunotherapy agents: Tecentriq      To help prevent nausea and vomiting after your treatment, we encourage you to take your nausea medication as directed.  BELOW ARE SYMPTOMS THAT SHOULD BE REPORTED IMMEDIATELY: *FEVER GREATER THAN 100.4 F (38 C) OR HIGHER *CHILLS OR SWEATING *NAUSEA AND VOMITING THAT IS NOT CONTROLLED WITH YOUR NAUSEA MEDICATION *UNUSUAL SHORTNESS OF BREATH *UNUSUAL BRUISING OR BLEEDING *URINARY PROBLEMS (pain or burning when urinating, or frequent urination) *BOWEL PROBLEMS (unusual diarrhea, constipation, pain near the anus) TENDERNESS IN MOUTH AND THROAT WITH OR WITHOUT PRESENCE OF ULCERS (sore throat, sores in mouth, or a toothache) UNUSUAL RASH, SWELLING OR PAIN  UNUSUAL VAGINAL DISCHARGE OR ITCHING   Items with * indicate a potential emergency and should be followed up as soon as possible or go to the Emergency Department if any problems should occur.  Please show the CHEMOTHERAPY ALERT CARD or IMMUNOTHERAPY ALERT CARD at  check-in to the Emergency Department and triage nurse.  Should you have questions after your visit or need to cancel or reschedule your appointment, please contact [facility]  Dept: [phone]  and follow the prompts.  Office hours are 8:00 a.m. to 4:30 p.m. Monday - Friday. Please note that voicemails left after 4:00 p.m. may not be returned until the following business day.  We are closed weekends and major holidays. You have access to a nurse at all times for urgent questions. Please call the main number to the clinic Dept: [phone] and follow the prompts.   For any non-urgent questions, you may also contact your provider using MyChart. We now offer e-Visits for anyone 18 and older to request care online for non-urgent symptoms. For details visit [email or web address].   Also download the MyChart app! Go to the app store, search "MyChart", open the app, select [facility], and log in with your MyChart username and password.  Hypokalemia Hypokalemia means that the amount of potassium in the blood is lower than normal. Potassium is a mineral (electrolyte) that helps regulate the amount of fluid in the body. It also stimulates muscle tightening (contraction) and helps nerves work properly. Normally, most of the body's potassium is inside cells, and only a very small amount is in the blood. Because the amount in the blood is so small, minor changes to potassium levels in the blood can be life-threatening. What are the causes? This condition may be caused by: Antibiotic medicine. Diarrhea or vomiting. Taking too much of a medicine that helps you have a   bowel movement (laxative) can cause diarrhea and lead to hypokalemia. Chronic kidney disease (CKD). Medicines that help the body get rid of excess fluid (diuretics). Eating disorders, such as anorexia or bulimia. Low magnesium levels in the body. Sweating a lot. What are the signs or symptoms? Symptoms  of this condition include: Weakness. Constipation. Fatigue. Muscle cramps. Mental confusion. Skipped heartbeats or irregular heartbeat (palpitations). Tingling or numbness. How is this diagnosed? This condition is diagnosed with a blood test. How is this treated? This condition may be treated by: Taking potassium supplements. Adjusting the medicines that you take. Eating more foods that contain a lot of potassium. If your potassium level is very low, you may need to get potassium through an IV and be monitored in the hospital. Follow these instructions at home: Eating and drinking  Eat a healthy diet. A healthy diet includes fresh fruits and vegetables, whole grains, healthy fats, and lean proteins. If told, eat more foods that contain a lot of potassium. These include: Nuts, such as peanuts and pistachios. Seeds, such as sunflower seeds and pumpkin seeds. Peas, lentils, and lima beans. Whole grain and bran cereals and breads. Fresh fruits and vegetables, such as apricots, avocado, bananas, cantaloupe, kiwi, oranges, tomatoes, asparagus, and potatoes. Juices, such as orange, tomato, and prune. Lean meats, including fish. Milk and milk products, such as yogurt. General instructions Take over-the-counter and prescription medicines only as told by your health care provider. This includes vitamins, natural food products, and supplements. Keep all follow-up visits. This is important. Contact a health care provider if: You have weakness that gets worse. You feel your heart pounding or racing. You vomit. You have diarrhea. You have diabetes and you have trouble keeping your blood sugar in your target range. Get help right away if: You have chest pain. You have shortness of breath. You have vomiting or diarrhea that lasts for more than 2 days. You faint. These symptoms may be an emergency. Get help right away. Call 911. Do not wait to see if the symptoms will go away. Do not  drive yourself to the hospital. Summary Hypokalemia means that the amount of potassium in the blood is lower than normal. This condition is diagnosed with a blood test. Hypokalemia may be treated by taking potassium supplements, adjusting the medicines that you take, or eating more foods that are high in potassium. If your potassium level is very low, you may need to get potassium through an IV and be monitored in the hospital. This information is not intended to replace advice given to you by your health care provider. Make sure you discuss any questions you have with your health care provider. Document Revised: 05/03/2021 Document Reviewed: 05/03/2021 Elsevier Patient Education  2023 Else[employer] Inc.  Dehydration, Adult Dehydration is a condition in which there is not enough water or other fluids in the body. This happens when a person loses more fluids than they take in. Important organs, such as the kidneys, brain, and heart, cannot function without a proper amount of fluids. Any loss of fluids from the body can lead to dehydration. Dehydration can be mild, moderate, or severe. It should be treated right away to prevent it from becoming severe. What are the causes? Dehydration may be caused by: Health conditions, such as diarrhea, vomiting, fever, infection, or sweating or urinating a lot. Not drinking enough fluids. Certain medicines, such as medicines that remove excess fluid from the body (diuretics). Lack of safe drinking water. Not being able to get enough   water and food. What increases the risk? The following factors may make you more likely to develop this condition: Having a long-term (chronic) illness that has not been treated properly, such as diabetes, heart disease, or kidney disease. Being 65 years of age or older. Having a disability. Living in a place that is high in altitude, where thinner, drier air causes more fluid loss. Doing exercises that put stress on your body for  a long time (endurance sports). Being active in a hot climate. What are the signs or symptoms? Symptoms of dehydration depend on how severe it is. Mild or moderate dehydration Thirst. Dry lips or dry mouth. Dizziness or light-headedness. Muscle cramps. Dark urine. Urine may be the color of tea. Less urine or tears produced than usual. Headache. Severe dehydration Changes in skin. Your skin may be cold and clammy, blotchy, or pale. Your skin also may not return to normal after being lightly pinched and released. Little or no tears, urine, or sweat. Rapid breathing and low blood pressure. Your pulse may be weak or may be faster than 100 beats per minute when you are sitting still. Other changes, such as: Feeling very thirsty. Sunken eyes. Cold hands and feet. Confusion. Being very tired (lethargic) or having trouble waking from sleep. Short-term weight loss. Loss of consciousness. How is this diagnosed? This condition is diagnosed based on your symptoms and a physical exam. You may have blood and urine tests to help confirm the diagnosis. How is this treated? Treatment for this condition depends on how severe it is. Treatment should be started right away. Do not wait until dehydration becomes severe. Severe dehydration is an emergency and needs to be treated in a hospital. Mild or moderate dehydration can be treated at home. You may be asked to: Drink more fluids. Drink an oral rehydration solution (ORS). This drink restores fluids, salts, and minerals in the blood (electrolytes). Stop any activities that caused dehydration, such as exercise. Cool off with cool compresses, cool mist, or cool fluids, if heat or too much sweat caused your condition. Take medicine to treat fever, if fever caused your condition. Take medicine to treat nausea and diarrhea, if vomiting or diarrhea caused your condition. Severe dehydration can be treated: With IV fluids. By correcting abnormal levels of  electrolytes in your body. By treating the underlying cause of dehydration. Follow these instructions at home: Oral rehydration solution If told by your health care provider, drink an ORS: Make an ORS by following instructions on the package. Start by drinking small amounts, about  cup (120 mL) every 5-10 minutes. Slowly increase how much you drink until you have taken the amount recommended by your health care provider.  Eating and drinking  Drink enough clear fluid to keep your urine pale yellow. If you were told to drink an ORS, finish the ORS first and then start slowly drinking other clear fluids. Drink fluids such as: Water. Do not drink only water. Doing that can lead to hyponatremia, which is having too little salt (sodium) in the body. Water from ice chips you suck on. Diluted fruit juice. This is fruit juice that you have added water to. Low-calorie sports drinks. Eat foods that contain a healthy balance of electrolytes, such as bananas, oranges, potatoes, tomatoes, and spinach. Do not drink alcohol. Avoid the following: Drinks that contain a lot of sugar. These include high-calorie sports drinks, fruit juice that is not diluted, and soda. Caffeine. Foods that are greasy or contain a lot of fat   or sugar. General instructions Take over-the-counter and prescription medicines only as told by your health care provider. Do not take sodium tablets. Doing that can lead to having too much sodium in the body (hypernatremia). Return to your normal activities as told by your health care provider. Ask your health care provider what activities are safe for you. Keep all follow-up visits. Your health care provider may need to check your progress and suggest new ways to treat your condition. Contact a health care provider if: You have muscle cramps, pain, or discomfort, such as: Pain in your abdomen and the pain gets worse or stays in one area. Stiff neck. You have a rash. You are more  irritable than usual. You are sleepier or have a harder time waking. You feel weak or dizzy. You feel very thirsty. Get help right away if: You have symptoms of severe dehydration. You vomit every time you eat or drink. Your vomiting gets worse, does not go away, or includes blood or green matter (bile). You are getting treatment but symptoms are getting worse. You have a fever. You have a severe headache. You have: Diarrhea that gets worse or does not go away. Blood in your stool. This may cause stool to look black and tarry. Not urinating, or urinating only a small amount of very dark urine, within 6-8 hours. You have trouble breathing. These symptoms may be an emergency. Get help right away. Do not wait to see if the symptoms will go away. Do not drive yourself to the hospital. Call 911. This information is not intended to replace advice given to you by your health care provider. Make sure you discuss any questions you have with your health care provider. Document Revised: 03/18/2022 Document Reviewed: 03/18/2022 Elsevier Patient Education  2023 Elsevier Inc.  

## 2023-02-12 ENCOUNTER — Inpatient Hospital Stay: Payer: Medicare Other

## 2023-02-12 ENCOUNTER — Inpatient Hospital Stay: Payer: Medicare Other | Admitting: Physician Assistant

## 2023-02-19 ENCOUNTER — Ambulatory Visit: Payer: Medicare Other | Admitting: Surgical

## 2023-02-24 MED ORDER — PHENYLEPHRINE HCL-NACL 20-0.9 MG/250ML-% IV SOLN
INTRAVENOUS | Status: AC
  Filled 2023-02-24: qty 250

## 2023-02-24 MED ORDER — PROPOFOL 10 MG/ML IV BOLUS
INTRAVENOUS | Status: AC
  Filled 2023-02-24: qty 20

## 2023-03-03 ENCOUNTER — Other Ambulatory Visit: Payer: Self-pay

## 2023-03-03 ENCOUNTER — Other Ambulatory Visit: Payer: Self-pay | Admitting: Adult Health

## 2023-03-03 DIAGNOSIS — I6522 Occlusion and stenosis of left carotid artery: Secondary | ICD-10-CM

## 2023-03-03 NOTE — Progress Notes (Unsigned)
Palliative Medicine [facility]  [phone] [phone]   Name: Debra Kidd Date: 03/03/2023 MRN: [id number]  DOB: Jun 11, 1961  Patient Care Team: Pcp, No as PCP - General    INTERVAL HISTORY: Debra Kidd is a 62 y.o. 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  Palliative ask to see for symptom management and goals of care.   SOCIAL HISTORY:     reports that she has been smoking cigarettes. She has a 86.00 pack-year smoking history. She has never used smokeless tobacco. She reports that she does not currently use alcohol. She reports current drug use. Frequency: 7.00 times per week. Drug: Marijuana.  ADVANCE DIRECTIVES:    CODE STATUS:   PAST MEDICAL HISTORY: Past Medical History:  Diagnosis Date   Basal cell carcinoma of cheek    L side of face   Blood type, Rh positive    Cancer (HCC)    Chronic pain syndrome    Depression 08/07/2016   Encounter for antineoplastic chemotherapy 07/17/2016   History of external beam radiation therapy 11/19/16-12/02/16   brain 25 Gy in 10 fractions   Hyperlipidemia    Hypertension    Hypertension 08/07/2016   Osteoporosis    Small cell lung cancer (HCC) dx'd 05/2016    ALLERGIES:  is allergic to codeine, motrin [ibuprofen], thiazide-type diuretics, and vicodin [hydrocodone-acetaminophen].  MEDICATIONS:  Current Outpatient Medications  Medication Sig Dispense Refill   atorvastatin (LIPITOR) 80 MG tablet Take 1 tablet (80 mg total) by mouth daily. 30 tablet 11   clopidogrel (PLAVIX) 75 MG tablet Take 1 tablet (75 mg total) by mouth daily. 90 tablet 3   diazepam (VALIUM) 5 MG tablet Take 1 tablet (5 mg total) by mouth every 12 (twelve) hours as needed for anxiety. 45 tablet 0   lidocaine-prilocaine (EMLA)  cream Apply 1 Application topically as needed. 30 g 2   oxyCODONE-acetaminophen (PERCOCET/ROXICET) 5-325 MG tablet Take 1 tablet by mouth every 6 (six) hours as needed for severe pain. 60 tablet 0   potassium chloride 20 MEQ/15ML (10%) SOLN Take 15 mLs (20 mEq total) by mouth 2 (two) times daily. 210 mL 0   triamcinolone cream (KENALOG) 0.1 % Apply 1 Application topically 2 (two) times daily. 80 g 2   Vitamin D, Ergocalciferol, [name],000 Units by mouth 3 (three) times a week.     No current facility-administered medications for this visit.    VITAL SIGNS: There were no vitals taken for this visit. There were no vitals filed for this visit.  Estimated body mass index is 17.68 kg/m as calculated from the following:   Height as of 01/02/23: 5\' 3"  (1.6 m).   Weight as of 02/11/23: 99 lb 12.8 oz (45.3 kg).   PERFORMANCE STATUS (ECOG) : 1 - Symptomatic but completely ambulatory  Assessment NAD, in wheelchair  Normal breathing pattern RRR AAO x4  IMPRESSION: Debra Kidd presents to clinic today for symptom management follow-up. No acute distress. Is doing well overall. Is celebrating her 35th wedding anniversary tomorrow. Denies nausea, vomiting, or diarrhea. Occasional fatigue however tries to remain as active as she can.   Chronic pain Debra Kidd endorses pain being well controlled.  Some days are better than others.  Associates increased pain with increased activity.  We reviewed patient's current regimen.  She is taking Oxycodone 5/325 as needed for severe pain. Is not requiring regularly. Last refilled in May. Tramadol for moderate pain. Takes at least one tablet daily   Will continue to closely monitor and adjust medications as needed.   Constipation Controlled with daily regimen.    Anxiety/Insomnia Controlled. Taking Valium as needed. Not requiring daily.   I discussed the importance of continued conversation with family and their  medical providers regarding overall plan of care and treatment options, ensuring decisions are within the context of the patients values and GOCs.  PLAN:  Oxycodone 5/325 1 tablet every 6 hours as needed for severe pain. Does not require regularly. Last refilled May.  Tramadol 50mg  every 8 hours as needed for moderate insurance.  Valium 5 mg 1 tablet every 12 hours as needed for anxiety/insomnia Pain contract completed. Senna S daily for bowel regimen I will plan to see patient back in 3-4 weeks in collaboration to other oncology appointments   Patient expressed understanding and was in agreement with this plan. She also understands that She can call the clinic at any time with any questions, concerns, or complaints.   Any controlled substances utilized were prescribed in the context of palliative care. PDMP has been reviewed.    Visit consisted of counseling and education dealing with the complex and emotionally intense issues of symptom management and palliative care in the setting of serious and potentially life-threatening illness.Greater than 50%  of this time was spent counseling and coordinating care related to the above assessment and plan.  [name], AGPCNP-BC  Palliative Medicine Team/[facility]  *Please note that this is a verbal dictation therefore any spelling or grammatical errors are due to the "[employer] One" system interpretation.

## 2023-03-05 ENCOUNTER — Other Ambulatory Visit: Payer: Self-pay

## 2023-03-05 ENCOUNTER — Inpatient Hospital Stay (HOSPITAL_BASED_OUTPATIENT_CLINIC_OR_DEPARTMENT_OTHER): Payer: Medicare Other | Admitting: Internal Medicine

## 2023-03-05 ENCOUNTER — Inpatient Hospital Stay: Payer: Medicare Other | Attending: Internal Medicine | Admitting: Dietician

## 2023-03-05 ENCOUNTER — Encounter: Payer: Self-pay | Admitting: Nurse Practitioner

## 2023-03-05 ENCOUNTER — Inpatient Hospital Stay: Payer: Medicare Other

## 2023-03-05 ENCOUNTER — Encounter: Payer: Self-pay | Admitting: Medical Oncology

## 2023-03-05 ENCOUNTER — Ambulatory Visit: Payer: Medicare Other | Admitting: Physician Assistant

## 2023-03-05 ENCOUNTER — Ambulatory Visit: Payer: Medicare Other

## 2023-03-05 ENCOUNTER — Other Ambulatory Visit: Payer: Medicare Other

## 2023-03-05 ENCOUNTER — Inpatient Hospital Stay (HOSPITAL_BASED_OUTPATIENT_CLINIC_OR_DEPARTMENT_OTHER): Payer: Medicare Other | Admitting: Nurse Practitioner

## 2023-03-05 VITALS — BP 129/75 | HR 64 | Resp 16

## 2023-03-05 VITALS — BP 106/78 | HR 69 | Temp 97.9°F | Resp 16 | Ht 63.0 in | Wt 100.2 lb

## 2023-03-05 DIAGNOSIS — C3491 Malignant neoplasm of unspecified part of right bronchus or lung: Secondary | ICD-10-CM

## 2023-03-05 DIAGNOSIS — Z7902 Long term (current) use of antithrombotics/antiplatelets: Secondary | ICD-10-CM | POA: Insufficient documentation

## 2023-03-05 DIAGNOSIS — F419 Anxiety disorder, unspecified: Secondary | ICD-10-CM

## 2023-03-05 DIAGNOSIS — Z85828 Personal history of other malignant neoplasm of skin: Secondary | ICD-10-CM | POA: Insufficient documentation

## 2023-03-05 DIAGNOSIS — I2510 Atherosclerotic heart disease of native coronary artery without angina pectoris: Secondary | ICD-10-CM | POA: Diagnosis not present

## 2023-03-05 DIAGNOSIS — I10 Essential (primary) hypertension: Secondary | ICD-10-CM | POA: Diagnosis not present

## 2023-03-05 DIAGNOSIS — R5383 Other fatigue: Secondary | ICD-10-CM

## 2023-03-05 DIAGNOSIS — Z9221 Personal history of antineoplastic chemotherapy: Secondary | ICD-10-CM | POA: Insufficient documentation

## 2023-03-05 DIAGNOSIS — C7800 Secondary malignant neoplasm of unspecified lung: Secondary | ICD-10-CM | POA: Diagnosis not present

## 2023-03-05 DIAGNOSIS — Z5112 Encounter for antineoplastic immunotherapy: Secondary | ICD-10-CM | POA: Insufficient documentation

## 2023-03-05 DIAGNOSIS — F17210 Nicotine dependence, cigarettes, uncomplicated: Secondary | ICD-10-CM | POA: Insufficient documentation

## 2023-03-05 DIAGNOSIS — K429 Umbilical hernia without obstruction or gangrene: Secondary | ICD-10-CM | POA: Insufficient documentation

## 2023-03-05 DIAGNOSIS — M8008XA Age-related osteoporosis with current pathological fracture, vertebra(e), initial encounter for fracture: Secondary | ICD-10-CM | POA: Insufficient documentation

## 2023-03-05 DIAGNOSIS — I700 Atherosclerosis of aorta: Secondary | ICD-10-CM | POA: Diagnosis not present

## 2023-03-05 DIAGNOSIS — C3490 Malignant neoplasm of unspecified part of unspecified bronchus or lung: Secondary | ICD-10-CM

## 2023-03-05 DIAGNOSIS — C3411 Malignant neoplasm of upper lobe, right bronchus or lung: Secondary | ICD-10-CM | POA: Insufficient documentation

## 2023-03-05 DIAGNOSIS — Z8673 Personal history of transient ischemic attack (TIA), and cerebral infarction without residual deficits: Secondary | ICD-10-CM | POA: Insufficient documentation

## 2023-03-05 DIAGNOSIS — Z923 Personal history of irradiation: Secondary | ICD-10-CM | POA: Insufficient documentation

## 2023-03-05 DIAGNOSIS — E785 Hyperlipidemia, unspecified: Secondary | ICD-10-CM | POA: Insufficient documentation

## 2023-03-05 DIAGNOSIS — Z515 Encounter for palliative care: Secondary | ICD-10-CM

## 2023-03-05 DIAGNOSIS — Z7982 Long term (current) use of aspirin: Secondary | ICD-10-CM | POA: Diagnosis not present

## 2023-03-05 DIAGNOSIS — G893 Neoplasm related pain (acute) (chronic): Secondary | ICD-10-CM

## 2023-03-05 DIAGNOSIS — K5903 Drug induced constipation: Secondary | ICD-10-CM

## 2023-03-05 DIAGNOSIS — Z79899 Other long term (current) drug therapy: Secondary | ICD-10-CM | POA: Diagnosis not present

## 2023-03-05 DIAGNOSIS — G894 Chronic pain syndrome: Secondary | ICD-10-CM | POA: Diagnosis not present

## 2023-03-05 DIAGNOSIS — C7889 Secondary malignant neoplasm of other digestive organs: Secondary | ICD-10-CM | POA: Insufficient documentation

## 2023-03-05 LAB — CBC WITH DIFFERENTIAL (CANCER CENTER ONLY)
Abs Immature Granulocytes: 0.01 10*3/uL (ref 0.00–0.07)
Basophils Absolute: 0 10*3/uL (ref 0.0–0.1)
Basophils Relative: 1 %
Eosinophils Absolute: 0.2 10*3/uL (ref 0.0–0.5)
Eosinophils Relative: 4 %
HCT: 33.6 % — ABNORMAL LOW (ref 36.0–46.0)
Hemoglobin: 11.8 g/dL — ABNORMAL LOW (ref 12.0–15.0)
Immature Granulocytes: 0 %
Lymphocytes Relative: 39 %
Lymphs Abs: 2.1 10*3/uL (ref 0.7–4.0)
MCH: 36.4 pg — ABNORMAL HIGH (ref 26.0–34.0)
MCHC: 35.1 g/dL (ref 30.0–36.0)
MCV: 103.7 fL — ABNORMAL HIGH (ref 80.0–100.0)
Monocytes Absolute: 0.3 10*3/uL (ref 0.1–1.0)
Monocytes Relative: 6 %
Neutro Abs: 2.7 10*3/uL (ref 1.7–7.7)
Neutrophils Relative %: 50 %
Platelet Count: 223 10*3/uL (ref 150–400)
RBC: 3.24 MIL/uL — ABNORMAL LOW (ref 3.87–5.11)
RDW: 12.6 % (ref 11.5–15.5)
WBC Count: 5.5 10*3/uL (ref 4.0–10.5)
nRBC: 0 % (ref 0.0–0.2)

## 2023-03-05 LAB — CMP (CANCER CENTER ONLY)
ALT: 11 U/L (ref 0–44)
AST: 17 U/L (ref 15–41)
Albumin: 3.5 g/dL (ref 3.5–5.0)
Alkaline Phosphatase: 104 U/L (ref 38–126)
Anion gap: 5 (ref 5–15)
BUN: 5 mg/dL — ABNORMAL LOW (ref 8–23)
CO2: 29 mmol/L (ref 22–32)
Calcium: 8.6 mg/dL — ABNORMAL LOW (ref 8.9–10.3)
Chloride: 104 mmol/L (ref 98–111)
Creatinine: 0.66 mg/dL (ref 0.44–1.00)
GFR, Estimated: >60 mL/min (ref >60)
Glucose, Bld: 90 mg/dL (ref 70–99)
Potassium: 3.5 mmol/L (ref 3.5–5.1)
Sodium: 138 mmol/L (ref 135–145)
Total Bilirubin: 0.6 mg/dL (ref 0.3–1.2)
Total Protein: 5.8 g/dL — ABNORMAL LOW (ref 6.5–8.1)

## 2023-03-05 LAB — TSH: TSH: 3.177 u[IU]/mL (ref 0.350–4.500)

## 2023-03-05 MED ORDER — HEPARIN SOD (PORK) LOCK FLUSH 100 UNIT/ML IV SOLN
500.0000 [IU] | Freq: Once | INTRAVENOUS | Status: AC | PRN
  Administered 2023-03-05: 500 [IU]

## 2023-03-05 MED ORDER — SODIUM CHLORIDE 0.9 % IV SOLN: Freq: Once | INTRAVENOUS | Status: AC

## 2023-03-05 MED ORDER — SODIUM CHLORIDE 0.9 % IV SOLN
1200.0000 mg | Freq: Once | INTRAVENOUS | Status: AC
  Administered 2023-03-05: 1200 mg via INTRAVENOUS
  Filled 2023-03-05: qty 20

## 2023-03-05 MED ORDER — SODIUM CHLORIDE 0.9% FLUSH
10.0000 mL | INTRAVENOUS | Status: DC | PRN
  Administered 2023-03-05: 10 mL

## 2023-03-05 MED ORDER — DIAZEPAM 5 MG PO TABS
5.0000 mg | ORAL_TABLET | Freq: Two times a day (BID) | ORAL | 0 refills | Status: DC | PRN
Start: 2023-03-05 — End: 2023-03-26

## 2023-03-05 MED ORDER — TRAMADOL HCL 50 MG PO TABS: 50.0000 mg | ORAL_TABLET | Freq: Four times a day (QID) | ORAL | 0 refills | Status: DC | PRN

## 2023-03-05 NOTE — Progress Notes (Signed)
[facility] Telephone:([phone]) [phone]   Fax:([phone]  OFFICE PROGRESS NOTE  Pcp, [name], N3, [name], large right anterior mediastinal and supraclavicular lymphadenopathy as well as pancreatic and splenic metastasis diagnosed in September 2017.  The patient had disease progression in October 2019.  PRIOR THERAPY:  1) Systemic chemotherapy was carboplatin for AUC of 5 on day 1 and etoposide 100 MG/M2 on days 1, 2 and 3 with Neulasta support. Status post 6 cycles with significant response of her disease. 2) Prophylactic cranial irradiation under the care of Dr. Roselind Messier on 12/02/2016. 3) stereotactic radiotherapy to the recurrent right upper lobe pulmonary nodule under the care of Dr. Roselind Messier completed November 10, 2017. 4) Retreatment with systemic chemotherapy with carboplatin for AUC of 5 on day 1 and etoposide 100 mg/M2 on days 1, 2 and 3 as well as Tecentriq (Atezolizumab) 1200 mg IV every 3 weeks with Neulasta support.  First dose June 22, 2018 for disease recurrence.  Status post 5 cycles.  Starting from cycle #2 her dose of carboplatin will be reduced to AUC of 4 and etoposide 80 mg/M2 on days 1, 2 and 3 in addition to the regular dose of Tecentriq.  CURRENT THERAPY: Maintenance treatment with single agent Tecentriq 1200 mg IV every 3 weeks.  Status post 71 cycles.  INTERVAL HISTORY: Debra Kidd 62 y.o. female returns to the clinic today for follow-up visit.  The patient is feeling fine today with no concerning complaints except for fatigue.  She denied having any current chest pain, shortness of breath except with exertion with no cough or hemoptysis.  She has no nausea, vomiting, diarrhea or constipation.  She has no headache or visual changes.  She denied having any recent weight loss or night sweats.  She continues to tolerate her maintenance treatment  with Tecentriq fairly well.  She is here for evaluation before starting cycle #77 of her total treatment.  MEDICAL HISTORY: Past Medical History:  Diagnosis Date   Basal cell carcinoma of cheek    L side of face   Blood type, Rh positive    Cancer (HCC)    Chronic pain syndrome    Depression 08/07/2016   Encounter for antineoplastic chemotherapy 07/17/2016   History of external beam radiation therapy 11/19/16-12/02/16   brain 25 Gy in 10 fractions   Hyperlipidemia    Hypertension    Hypertension 08/07/2016   Osteoporosis    Small cell lung cancer (HCC) dx'd 05/2016    ALLERGIES:  is allergic to codeine, motrin [ibuprofen], thiazide-type diuretics, and vicodin [hydrocodone-acetaminophen].  MEDICATIONS:  Current Outpatient Medications  Medication Sig Dispense Refill   atorvastatin (LIPITOR) 80 MG tablet TAKE ONE TABLET BY MOUTH EVERY DAY 30 tablet 11   clopidogrel (PLAVIX) 75 MG tablet Take 1 tablet (75 mg total) by mouth daily. 90 tablet 3   diazepam (VALIUM) 5 MG tablet Take 1 tablet (5 mg total) by mouth every 12 (twelve) hours as needed for anxiety. 45 tablet 0   lidocaine-prilocaine (EMLA) cream Apply 1 Application topically as needed. 30 g 2   oxyCODONE-acetaminophen (PERCOCET/ROXICET) 5-325 MG tablet Take 1 tablet by mouth every 6 (six) hours as needed for severe pain. 60 tablet 0   potassium chloride 20 MEQ/15ML (10%) SOLN Take 15 mLs (20 mEq total) by mouth 2 (two) times daily. 210 mL 0   triamcinolone  cream (KENALOG) 0.1 % Apply 1 Application topically 2 (two) times daily. 80 g 2   Vitamin D, Ergocalciferol, [name],000 Units by mouth 3 (three) times a week.     No current facility-administered medications for this visit.   Facility-Administered Medications Ordered in Other Visits  Medication Dose Route Frequency Provider Last Rate Last Admin   sodium chloride flush (NS) 0.9 % injection 10 mL  10 mL Intracatheter PRN Si Gaul, MD   10 mL at 03/05/23 2130    SURGICAL HISTORY:  Past Surgical History:  Procedure Laterality Date   ABDOMINAL HYSTERECTOMY  1981   APPENDECTOMY     at age 70   EYE SURGERY     left   IR GENERIC HISTORICAL  06/03/2016   IR FLUORO GUIDE PORT INSERTION RIGHT 06/03/2016 [facility]   IR GENERIC HISTORICAL  06/03/2016   IR US GUIDE VASC ACCESS RIGHT 06/03/2016 [facility]   SKIN CANCER EXCISION     basal cell carcinoma L side of face    REVIEW OF SYSTEMS:  A comprehensive review of systems was negative except for: Constitutional: positive for fatigue Respiratory: positive for dyspnea on exertion Musculoskeletal: positive for arthralgias   PHYSICAL EXAMINATION: General appearance: alert, cooperative, fatigued, and no distress Head: Normocephalic, without obvious abnormality, atraumatic Neck: no adenopathy, no JVD, supple, symmetrical, trachea midline, and thyroid not enlarged, symmetric, no tenderness/mass/nodules Lymph nodes: Cervical, supraclavicular, and axillary nodes normal. Resp: clear to auscultation bilaterally Back: symmetric, no curvature. ROM normal. No CVA tenderness. Cardio: regular rate and rhythm, S1, S2 normal, no murmur, click, rub or gallop GI: soft, non-tender; bowel sounds normal; no masses,  no organomegaly Extremities: extremities normal, atraumatic, no cyanosis or edema  ECOG PERFORMANCE STATUS: 1 - Symptomatic but completely ambulatory  Blood pressure 106/78, pulse 69, temperature 97.9 F (36.6 C), temperature source Oral, resp. rate 16, height 5\' 3"  (1.6 m), weight 100 lb 3.2 oz (45.5 kg), SpO2 100 %.  LABORATORY DATA: Lab Results  Component Value Date   WBC 6.0 02/11/2023   HGB 12.1 02/11/2023   HCT 35.9 (L) 02/11/2023   MCV 104.4 (H) 02/11/2023   PLT 230 02/11/2023      Chemistry      Component Value Date/Time   NA 137 02/11/2023 0751   NA 141 08/01/2017 0835   K 3.7 02/11/2023 0751   K 3.5 08/01/2017 0835   CL 105 02/11/2023 0751    CO2 27 02/11/2023 0751   CO2 25 08/01/2017 0835   BUN 7 (L) 02/11/2023 0751   BUN 8.0 08/01/2017 0835   CREATININE 0.73 02/11/2023 0751   CREATININE 0.9 08/01/2017 0835      Component Value Date/Time   CALCIUM 8.8 (L) 02/11/2023 0751   CALCIUM 9.9 08/01/2017 0835   ALKPHOS 102 02/11/2023 0751   ALKPHOS 142 08/01/2017 0835   AST 20 02/11/2023 0751   AST 15 08/01/2017 0835   ALT 13 02/11/2023 0751   ALT 19 08/01/2017 0835   BILITOT 0.5 02/11/2023 0751   BILITOT 0.35 08/01/2017 0835       RADIOGRAPHIC STUDIES: No results found.  ASSESSMENT AND PLAN:  This is a very pleasant 62 years old white female with extensive stage small cell lung cancer status post systemic chemotherapy with carbo platinum and etoposide for 6 cycles and the patient rotated her treatment well except for chemotherapy-induced anemia and requirement for PRBCs and platelet transfusion. She had significant improvement in her  disease with the chemotherapy. She also had prophylactic cranial irradiation. She also underwent stereotactic radiotherapy to progressive right upper lobe pulmonary nodule. The patient has been in observation for close to 2 years. Repeat CT scan of the chest, abdomen and pelvis performed at that time showed evidence for disease progression in the abdomen. The patient started on systemic chemotherapy again with carboplatin, etoposide and Tecentriq status post 76 cycles.  Starting from cycle #5 the patient is treated with maintenance single agent Tecentriq. The patient has been tolerating this treatment well with no concerning adverse effects except for the baseline mild fatigue. Recommended for her to proceed with cycle #77 today as planned. I will see her back for follow-up visit in 3 weeks for evaluation with repeat CT scan of the chest, abdomen and pelvis for restaging of her disease. For the anxiety and pain management, she was referred to the palliative care team and she has an appointment  with them today. For the history of a stroke she is currently on treatment with Plavix and aspirin and she is followed by neurology. The patient was advised to call immediately if she has any concerning symptoms in the interval. All questions were answered. The patient knows to call the clinic with any problems, questions or concerns. We can certainly see the patient much sooner if necessary.  Disclaimer: This note was dictated with voice recognition software. Similar sounding words can inadvertently be transcribed and may not be corrected upon review.

## 2023-03-05 NOTE — Progress Notes (Signed)
Nutrition Follow-up:  Patient with extensive stage small cell lung cancer. She is currently receiving maintenance treatment with single agent Tecentriq 1200 mg IV every 3 weeks.    Met with patient in infusion. She reports good appetite and eating well. Patient is planning to have a hotdog after treatment. She is drinking 1-2 Ensure. Patient denies nutrition impact symptoms at this time.    Medications: lipitor (7/1)  Labs: BUN 5  Anthropometrics: Wt 100 lb 3.2 oz today - stable  6/11 - 99 lb 12.8 oz 5/2 - 99 lb 4 oz  4/11 - 100 lb 11.2 oz    NUTRITION DIAGNOSIS: Unintended wt loss stable   INTERVENTION:  Encouraged high calorie high protein foods  Continue 1-2 Ensure Plus HP/day   One bag of food from [employer] provided today Patient provided with one complimentary case of ensure due to one or more of the following reasons:  malnutrition, weight loss/maintenance, poor appetite AND food insecurity or financial hardship   MONITORING, EVALUATION, GOAL: weight trends, intake   NEXT VISIT: Wednesday August 14 during infusion

## 2023-03-05 NOTE — Patient Instructions (Signed)
[facility]  Discharge Instructions: Thank you for choosing [facility] to provide your oncology and hematology care.   If you have a lab appointment with the Cancer Center, please go directly to the Cancer Center and check in at the registration area.   Wear comfortable clothing and clothing appropriate for easy access to any Portacath or PICC line.   We strive to give you quality time with your provider. You may need to reschedule your appointment if you arrive late (15 or more minutes).  Arriving late affects you and other patients whose appointments are after yours.  Also, if you miss three or more appointments without notifying the office, you may be dismissed from the clinic at the provider's discretion.      For prescription refill requests, have your pharmacy contact our office and allow 72 hours for refills to be completed.    Today you received the following chemotherapy and/or immunotherapy agents: Tecentriq      To help prevent nausea and vomiting after your treatment, we encourage you to take your nausea medication as directed.  BELOW ARE SYMPTOMS THAT SHOULD BE REPORTED IMMEDIATELY: *FEVER GREATER THAN 100.4 F (38 C) OR HIGHER *CHILLS OR SWEATING *NAUSEA AND VOMITING THAT IS NOT CONTROLLED WITH YOUR NAUSEA MEDICATION *UNUSUAL SHORTNESS OF BREATH *UNUSUAL BRUISING OR BLEEDING *URINARY PROBLEMS (pain or burning when urinating, or frequent urination) *BOWEL PROBLEMS (unusual diarrhea, constipation, pain near the anus) TENDERNESS IN MOUTH AND THROAT WITH OR WITHOUT PRESENCE OF ULCERS (sore throat, sores in mouth, or a toothache) UNUSUAL RASH, SWELLING OR PAIN  UNUSUAL VAGINAL DISCHARGE OR ITCHING   Items with * indicate a potential emergency and should be followed up as soon as possible or go to the Emergency Department if any problems should occur.  Please show the CHEMOTHERAPY ALERT CARD or IMMUNOTHERAPY ALERT CARD at  check-in to the Emergency Department and triage nurse.  Should you have questions after your visit or need to cancel or reschedule your appointment, please contact [facility]  Dept: [phone]  and follow the prompts.  Office hours are 8:00 a.m. to 4:30 p.m. Monday - Friday. Please note that voicemails left after 4:00 p.m. may not be returned until the following business day.  We are closed weekends and major holidays. You have access to a nurse at all times for urgent questions. Please call the main number to the clinic Dept: [phone] and follow the prompts.   For any non-urgent questions, you may also contact your provider using MyChart. We now offer e-Visits for anyone 18 and older to request care online for non-urgent symptoms. For details visit [email or web address].   Also download the MyChart app! Go to the app store, search "MyChart", open the app, select [facility], and log in with your MyChart username and password.  Hypokalemia Hypokalemia means that the amount of potassium in the blood is lower than normal. Potassium is a mineral (electrolyte) that helps regulate the amount of fluid in the body. It also stimulates muscle tightening (contraction) and helps nerves work properly. Normally, most of the body's potassium is inside cells, and only a very small amount is in the blood. Because the amount in the blood is so small, minor changes to potassium levels in the blood can be life-threatening. What are the causes? This condition may be caused by: Antibiotic medicine. Diarrhea or vomiting. Taking too much of a medicine that helps you have a   bowel movement (laxative) can cause diarrhea and lead to hypokalemia. Chronic kidney disease (CKD). Medicines that help the body get rid of excess fluid (diuretics). Eating disorders, such as anorexia or bulimia. Low magnesium levels in the body. Sweating a lot. What are the signs or symptoms? Symptoms  of this condition include: Weakness. Constipation. Fatigue. Muscle cramps. Mental confusion. Skipped heartbeats or irregular heartbeat (palpitations). Tingling or numbness. How is this diagnosed? This condition is diagnosed with a blood test. How is this treated? This condition may be treated by: Taking potassium supplements. Adjusting the medicines that you take. Eating more foods that contain a lot of potassium. If your potassium level is very low, you may need to get potassium through an IV and be monitored in the hospital. Follow these instructions at home: Eating and drinking  Eat a healthy diet. A healthy diet includes fresh fruits and vegetables, whole grains, healthy fats, and lean proteins. If told, eat more foods that contain a lot of potassium. These include: Nuts, such as peanuts and pistachios. Seeds, such as sunflower seeds and pumpkin seeds. Peas, lentils, and lima beans. Whole grain and bran cereals and breads. Fresh fruits and vegetables, such as apricots, avocado, bananas, cantaloupe, kiwi, oranges, tomatoes, asparagus, and potatoes. Juices, such as orange, tomato, and prune. Lean meats, including fish. Milk and milk products, such as yogurt. General instructions Take over-the-counter and prescription medicines only as told by your health care provider. This includes vitamins, natural food products, and supplements. Keep all follow-up visits. This is important. Contact a health care provider if: You have weakness that gets worse. You feel your heart pounding or racing. You vomit. You have diarrhea. You have diabetes and you have trouble keeping your blood sugar in your target range. Get help right away if: You have chest pain. You have shortness of breath. You have vomiting or diarrhea that lasts for more than 2 days. You faint. These symptoms may be an emergency. Get help right away. Call 911. Do not wait to see if the symptoms will go away. Do not  drive yourself to the hospital. Summary Hypokalemia means that the amount of potassium in the blood is lower than normal. This condition is diagnosed with a blood test. Hypokalemia may be treated by taking potassium supplements, adjusting the medicines that you take, or eating more foods that are high in potassium. If your potassium level is very low, you may need to get potassium through an IV and be monitored in the hospital. This information is not intended to replace advice given to you by your health care provider. Make sure you discuss any questions you have with your health care provider. Document Revised: 05/03/2021 Document Reviewed: 05/03/2021 Elsevier Patient Education  2023 Else[employer] Inc.  Dehydration, Adult Dehydration is a condition in which there is not enough water or other fluids in the body. This happens when a person loses more fluids than they take in. Important organs, such as the kidneys, brain, and heart, cannot function without a proper amount of fluids. Any loss of fluids from the body can lead to dehydration. Dehydration can be mild, moderate, or severe. It should be treated right away to prevent it from becoming severe. What are the causes? Dehydration may be caused by: Health conditions, such as diarrhea, vomiting, fever, infection, or sweating or urinating a lot. Not drinking enough fluids. Certain medicines, such as medicines that remove excess fluid from the body (diuretics). Lack of safe drinking water. Not being able to get enough   water and food. What increases the risk? The following factors may make you more likely to develop this condition: Having a long-term (chronic) illness that has not been treated properly, such as diabetes, heart disease, or kidney disease. Being 65 years of age or older. Having a disability. Living in a place that is high in altitude, where thinner, drier air causes more fluid loss. Doing exercises that put stress on your body for  a long time (endurance sports). Being active in a hot climate. What are the signs or symptoms? Symptoms of dehydration depend on how severe it is. Mild or moderate dehydration Thirst. Dry lips or dry mouth. Dizziness or light-headedness. Muscle cramps. Dark urine. Urine may be the color of tea. Less urine or tears produced than usual. Headache. Severe dehydration Changes in skin. Your skin may be cold and clammy, blotchy, or pale. Your skin also may not return to normal after being lightly pinched and released. Little or no tears, urine, or sweat. Rapid breathing and low blood pressure. Your pulse may be weak or may be faster than 100 beats per minute when you are sitting still. Other changes, such as: Feeling very thirsty. Sunken eyes. Cold hands and feet. Confusion. Being very tired (lethargic) or having trouble waking from sleep. Short-term weight loss. Loss of consciousness. How is this diagnosed? This condition is diagnosed based on your symptoms and a physical exam. You may have blood and urine tests to help confirm the diagnosis. How is this treated? Treatment for this condition depends on how severe it is. Treatment should be started right away. Do not wait until dehydration becomes severe. Severe dehydration is an emergency and needs to be treated in a hospital. Mild or moderate dehydration can be treated at home. You may be asked to: Drink more fluids. Drink an oral rehydration solution (ORS). This drink restores fluids, salts, and minerals in the blood (electrolytes). Stop any activities that caused dehydration, such as exercise. Cool off with cool compresses, cool mist, or cool fluids, if heat or too much sweat caused your condition. Take medicine to treat fever, if fever caused your condition. Take medicine to treat nausea and diarrhea, if vomiting or diarrhea caused your condition. Severe dehydration can be treated: With IV fluids. By correcting abnormal levels of  electrolytes in your body. By treating the underlying cause of dehydration. Follow these instructions at home: Oral rehydration solution If told by your health care provider, drink an ORS: Make an ORS by following instructions on the package. Start by drinking small amounts, about  cup (120 mL) every 5-10 minutes. Slowly increase how much you drink until you have taken the amount recommended by your health care provider.  Eating and drinking  Drink enough clear fluid to keep your urine pale yellow. If you were told to drink an ORS, finish the ORS first and then start slowly drinking other clear fluids. Drink fluids such as: Water. Do not drink only water. Doing that can lead to hyponatremia, which is having too little salt (sodium) in the body. Water from ice chips you suck on. Diluted fruit juice. This is fruit juice that you have added water to. Low-calorie sports drinks. Eat foods that contain a healthy balance of electrolytes, such as bananas, oranges, potatoes, tomatoes, and spinach. Do not drink alcohol. Avoid the following: Drinks that contain a lot of sugar. These include high-calorie sports drinks, fruit juice that is not diluted, and soda. Caffeine. Foods that are greasy or contain a lot of fat   or sugar. General instructions Take over-the-counter and prescription medicines only as told by your health care provider. Do not take sodium tablets. Doing that can lead to having too much sodium in the body (hypernatremia). Return to your normal activities as told by your health care provider. Ask your health care provider what activities are safe for you. Keep all follow-up visits. Your health care provider may need to check your progress and suggest new ways to treat your condition. Contact a health care provider if: You have muscle cramps, pain, or discomfort, such as: Pain in your abdomen and the pain gets worse or stays in one area. Stiff neck. You have a rash. You are more  irritable than usual. You are sleepier or have a harder time waking. You feel weak or dizzy. You feel very thirsty. Get help right away if: You have symptoms of severe dehydration. You vomit every time you eat or drink. Your vomiting gets worse, does not go away, or includes blood or green matter (bile). You are getting treatment but symptoms are getting worse. You have a fever. You have a severe headache. You have: Diarrhea that gets worse or does not go away. Blood in your stool. This may cause stool to look black and tarry. Not urinating, or urinating only a small amount of very dark urine, within 6-8 hours. You have trouble breathing. These symptoms may be an emergency. Get help right away. Do not wait to see if the symptoms will go away. Do not drive yourself to the hospital. Call 911. This information is not intended to replace advice given to you by your health care provider. Make sure you discuss any questions you have with your health care provider. Document Revised: 03/18/2022 Document Reviewed: 03/18/2022 Elsevier Patient Education  2023 Elsevier Inc.  

## 2023-03-14 ENCOUNTER — Telehealth: Payer: Self-pay | Admitting: Internal Medicine

## 2023-03-14 NOTE — Telephone Encounter (Signed)
Called patient regarding July/August and September appointments, patient is notified.

## 2023-03-20 ENCOUNTER — Inpatient Hospital Stay: Payer: Medicare Other

## 2023-03-20 ENCOUNTER — Ambulatory Visit (HOSPITAL_BASED_OUTPATIENT_CLINIC_OR_DEPARTMENT_OTHER)
Admission: RE | Admit: 2023-03-20 | Discharge: 2023-03-20 | Disposition: A | Discharge status: Home / Self Care | Payer: Medicare Other | Source: Ambulatory Visit | Attending: Internal Medicine | Admitting: Internal Medicine

## 2023-03-20 VITALS — BP 106/75 | HR 74 | Temp 98.7°F | Resp 18

## 2023-03-20 DIAGNOSIS — C3490 Malignant neoplasm of unspecified part of unspecified bronchus or lung: Secondary | ICD-10-CM

## 2023-03-20 DIAGNOSIS — C3491 Malignant neoplasm of unspecified part of right bronchus or lung: Secondary | ICD-10-CM

## 2023-03-20 MED ORDER — HEPARIN SOD (PORK) LOCK FLUSH 100 UNIT/ML IV SOLN
500.0000 [IU] | Freq: Once | INTRAVENOUS | Status: AC
  Administered 2023-03-20: 500 [IU] via INTRAVENOUS

## 2023-03-20 MED ORDER — IOHEXOL 300 MG/ML  SOLN
100.0000 mL | Freq: Once | INTRAMUSCULAR | Status: AC | PRN
  Administered 2023-03-20: 80 mL via INTRAVENOUS

## 2023-03-20 MED ORDER — SODIUM CHLORIDE 0.9% FLUSH
10.0000 mL | Freq: Once | INTRAVENOUS | Status: AC
  Administered 2023-03-20: 10 mL via INTRAVENOUS

## 2023-03-25 NOTE — Progress Notes (Unsigned)
Palliative Medicine [facility]  [phone] [phone]   Name: Debra Kidd Date: 03/25/2023 MRN: [id number]  DOB: [birth year]  Patient Care Team: Pcp, No as PCP - General    INTERVAL HISTORY: Debra Kidd is a 62 y.o. 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  Palliative ask to see for symptom management and goals of care.   SOCIAL HISTORY:     reports that she has been smoking cigarettes. She has a 86 pack-year smoking history. She has never used smokeless tobacco. She reports that she does not currently use alcohol. She reports current drug use. Frequency: 7.00 times per week. Drug: Marijuana.  ADVANCE DIRECTIVES:    CODE STATUS:   PAST MEDICAL HISTORY: Past Medical History:  Diagnosis Date   Basal cell carcinoma of cheek    L side of face   Blood type, Rh positive    Cancer (HCC)    Chronic pain syndrome    Depression 08/07/2016   Encounter for antineoplastic chemotherapy 07/17/2016   History of external beam radiation therapy 11/19/16-12/02/16   brain 25 Gy in 10 fractions   Hyperlipidemia    Hypertension    Hypertension 08/07/2016   Osteoporosis    Small cell lung cancer (HCC) dx'd 05/2016    ALLERGIES:  is allergic to codeine, motrin [ibuprofen], thiazide-type diuretics, and vicodin [hydrocodone-acetaminophen].  MEDICATIONS:  Current Outpatient Medications  Medication Sig Dispense Refill   atorvastatin (LIPITOR) 80 MG tablet TAKE ONE TABLET BY MOUTH EVERY DAY 30 tablet 11   clopidogrel (PLAVIX) 75 MG tablet Take 1 tablet (75 mg total) by mouth daily. 90 tablet 3   diazepam (VALIUM) 5 MG tablet Take 1 tablet (5 mg total) by mouth every 12 (twelve) hours as needed for anxiety. 45 tablet 0   lidocaine-prilocaine (EMLA) cream Apply 1  Application topically as needed. 30 g 2   oxyCODONE-acetaminophen (PERCOCET/ROXICET) 5-325 MG tablet Take 1 tablet by mouth every 6 (six) hours as needed for severe pain. 60 tablet 0   potassium chloride 20 MEQ/15ML (10%) SOLN Take 15 mLs (20 mEq total) by mouth 2 (two) times daily. 210 mL 0   traMADol (ULTRAM) 50 MG tablet Take 1 tablet (50 mg total) by mouth every 6 (six) hours as needed. 60 tablet 0   triamcinolone cream (KENALOG) 0.1 % Apply 1 Application topically 2 (two) times daily. 80 g 2   Vitamin D, Ergocalciferol, [name],000 Units by mouth 3 (three) times a week.     No current facility-administered medications for this visit.    VITAL SIGNS: There were no vitals taken for this visit. There were no vitals filed for this visit.  Estimated body mass index is 17.75 kg/m as calculated from the following:   Height as of 03/05/23: 5\' 3"  (1.6 m).   Weight as of 03/05/23: 100 lb 3.2 oz (45.5 kg).   PERFORMANCE STATUS (ECOG) : 1 - Symptomatic but completely ambulatory  Assessment NAD, sitting in recliner Normal breathing pattern RRR AAO x4  IMPRESSION: I saw Debra Kidd during her infusion. No acute distress. Denies nausea, vomiting, constipation, or diarrhea. Is trying to remain as active as possible.   Chronic pain Debra Kidd endorses pain being well controlled.  Some days are better than others.  Associates increased  pain with increased activity.    We reviewed patient's current regimen.  She is taking Oxycodone 5/325 as needed for severe pain. Is not requiring regularly. Last refilled in 6/11. Tramadol for moderate pain. Takes at least one-two tablets daily.  Will continue to closely monitor and adjust medications as needed.   Constipation Controlled with daily regimen.    Anxiety/Insomnia Controlled. Taking Valium as needed. Feels her anxiety has increased over the past month. States she will sometimes wake up early in the morning  and will have to calm herself down from shaking.   I discussed the importance of continued conversation with family and their medical providers regarding overall plan of care and treatment options, ensuring decisions are within the context of the patients values and GOCs.  PLAN:  Oxycodone 5/325 1 tablet every 6 hours as needed for severe pain. Does not require regularly. Last refilled 6/11 Tramadol 50mg  every 8 hours as needed for moderate insurance.  Valium 5 mg 1 tablet every 8 hours as needed for anxiety/insomnia Pain contract completed. Senna S daily for bowel regimen I will plan to see patient back in 3-4 weeks in collaboration to other oncology appointments   Patient expressed understanding and was in agreement with this plan. She also understands that She can call the clinic at any time with any questions, concerns, or complaints.   Any controlled substances utilized were prescribed in the context of palliative care. PDMP has been reviewed.    Visit consisted of counseling and education dealing with the complex and emotionally intense issues of symptom management and palliative care in the setting of serious and potentially life-threatening illness.Greater than 50%  of this time was spent counseling and coordinating care related to the above assessment and plan.  [name], AGPCNP-BC  Palliative Medicine Team/[facility]  *Please note that this is a verbal dictation therefore any spelling or grammatical errors are due to the "[employer]agon [employer] One" system interpretation.

## 2023-03-26 ENCOUNTER — Inpatient Hospital Stay: Payer: Medicare Other

## 2023-03-26 ENCOUNTER — Inpatient Hospital Stay (HOSPITAL_BASED_OUTPATIENT_CLINIC_OR_DEPARTMENT_OTHER): Payer: Medicare Other | Admitting: Internal Medicine

## 2023-03-26 ENCOUNTER — Encounter: Payer: Self-pay | Admitting: Internal Medicine

## 2023-03-26 ENCOUNTER — Encounter: Payer: Self-pay | Admitting: Nurse Practitioner

## 2023-03-26 ENCOUNTER — Inpatient Hospital Stay (HOSPITAL_BASED_OUTPATIENT_CLINIC_OR_DEPARTMENT_OTHER): Payer: Medicare Other | Admitting: Nurse Practitioner

## 2023-03-26 ENCOUNTER — Other Ambulatory Visit: Payer: Self-pay

## 2023-03-26 VITALS — BP 114/78 | HR 80 | Temp 98.5°F | Resp 17 | Ht 63.0 in | Wt 98.7 lb

## 2023-03-26 VITALS — BP 118/66 | HR 80 | Resp 17

## 2023-03-26 DIAGNOSIS — C3491 Malignant neoplasm of unspecified part of right bronchus or lung: Secondary | ICD-10-CM

## 2023-03-26 DIAGNOSIS — G893 Neoplasm related pain (acute) (chronic): Secondary | ICD-10-CM | POA: Diagnosis not present

## 2023-03-26 DIAGNOSIS — R530 Neoplastic (malignant) related fatigue: Secondary | ICD-10-CM

## 2023-03-26 DIAGNOSIS — Z515 Encounter for palliative care: Secondary | ICD-10-CM | POA: Diagnosis not present

## 2023-03-26 DIAGNOSIS — F419 Anxiety disorder, unspecified: Secondary | ICD-10-CM | POA: Diagnosis not present

## 2023-03-26 DIAGNOSIS — Z5112 Encounter for antineoplastic immunotherapy: Secondary | ICD-10-CM | POA: Diagnosis not present

## 2023-03-26 DIAGNOSIS — R5383 Other fatigue: Secondary | ICD-10-CM

## 2023-03-26 LAB — CMP (CANCER CENTER ONLY)
ALT: 13 U/L (ref 0–44)
AST: 19 U/L (ref 15–41)
Albumin: 3.6 g/dL (ref 3.5–5.0)
Alkaline Phosphatase: 118 U/L (ref 38–126)
Anion gap: 7 (ref 5–15)
BUN: <5 mg/dL — ABNORMAL LOW (ref 8–23)
CO2: 28 mmol/L (ref 22–32)
Calcium: 8.7 mg/dL — ABNORMAL LOW (ref 8.9–10.3)
Chloride: 104 mmol/L (ref 98–111)
Creatinine: 0.78 mg/dL (ref 0.44–1.00)
GFR, Estimated: >60 mL/min (ref >60)
Glucose, Bld: 96 mg/dL (ref 70–99)
Potassium: 3.2 mmol/L — ABNORMAL LOW (ref 3.5–5.1)
Sodium: 139 mmol/L (ref 135–145)
Total Bilirubin: 0.4 mg/dL (ref 0.3–1.2)
Total Protein: 6 g/dL — ABNORMAL LOW (ref 6.5–8.1)

## 2023-03-26 LAB — CBC WITH DIFFERENTIAL (CANCER CENTER ONLY)
Abs Immature Granulocytes: 0.02 10*3/uL (ref 0.00–0.07)
Basophils Absolute: 0 10*3/uL (ref 0.0–0.1)
Basophils Relative: 0 %
Eosinophils Absolute: 0.2 10*3/uL (ref 0.0–0.5)
Eosinophils Relative: 4 %
HCT: 35 % — ABNORMAL LOW (ref 36.0–46.0)
Hemoglobin: 12 g/dL (ref 12.0–15.0)
Immature Granulocytes: 0 %
Lymphocytes Relative: 28 %
Lymphs Abs: 1.9 10*3/uL (ref 0.7–4.0)
MCH: 35.8 pg — ABNORMAL HIGH (ref 26.0–34.0)
MCHC: 34.3 g/dL (ref 30.0–36.0)
MCV: 104.5 fL — ABNORMAL HIGH (ref 80.0–100.0)
Monocytes Absolute: 0.4 10*3/uL (ref 0.1–1.0)
Monocytes Relative: 6 %
Neutro Abs: 4.3 10*3/uL (ref 1.7–7.7)
Neutrophils Relative %: 62 %
Platelet Count: 235 10*3/uL (ref 150–400)
RBC: 3.35 MIL/uL — ABNORMAL LOW (ref 3.87–5.11)
RDW: 13.1 % (ref 11.5–15.5)
WBC Count: 6.9 10*3/uL (ref 4.0–10.5)
nRBC: 0 % (ref 0.0–0.2)

## 2023-03-26 LAB — TSH: TSH: 3.59 u[IU]/mL (ref 0.350–4.500)

## 2023-03-26 MED ORDER — TRAMADOL HCL 50 MG PO TABS
50.0000 mg | ORAL_TABLET | Freq: Four times a day (QID) | ORAL | 0 refills | Status: DC | PRN
Start: 2023-03-26 — End: 2023-04-16

## 2023-03-26 MED ORDER — HEPARIN SOD (PORK) LOCK FLUSH 100 UNIT/ML IV SOLN
500.0000 [IU] | Freq: Once | INTRAVENOUS | Status: AC | PRN
  Administered 2023-03-26: 500 [IU]

## 2023-03-26 MED ORDER — SODIUM CHLORIDE 0.9 % IV SOLN
1200.0000 mg | Freq: Once | INTRAVENOUS | Status: AC
  Administered 2023-03-26: 1200 mg via INTRAVENOUS
  Filled 2023-03-26: qty 20

## 2023-03-26 MED ORDER — SODIUM CHLORIDE 0.9% FLUSH
10.0000 mL | INTRAVENOUS | Status: DC | PRN
  Administered 2023-03-26: 10 mL

## 2023-03-26 MED ORDER — DIAZEPAM 5 MG PO TABS
5.0000 mg | ORAL_TABLET | Freq: Three times a day (TID) | ORAL | 0 refills | Status: DC | PRN
Start: 2023-03-26 — End: 2023-05-28

## 2023-03-26 MED ORDER — SODIUM CHLORIDE 0.9 % IV SOLN: Freq: Once | INTRAVENOUS | Status: AC

## 2023-03-26 NOTE — Patient Instructions (Signed)
[facility]  Discharge Instructions: Thank you for choosing [facility] to provide your oncology and hematology care.   If you have a lab appointment with the Cancer Center, please go directly to the Cancer Center and check in at the registration area.   Wear comfortable clothing and clothing appropriate for easy access to any Portacath or PICC line.   We strive to give you quality time with your provider. You may need to reschedule your appointment if you arrive late (15 or more minutes).  Arriving late affects you and other patients whose appointments are after yours.  Also, if you miss three or more appointments without notifying the office, you may be dismissed from the clinic at the provider's discretion.      For prescription refill requests, have your pharmacy contact our office and allow 72 hours for refills to be completed.    Today you received the following chemotherapy and/or immunotherapy agents Tecentriq      To help prevent nausea and vomiting after your treatment, we encourage you to take your nausea medication as directed.  BELOW ARE SYMPTOMS THAT SHOULD BE REPORTED IMMEDIATELY: *FEVER GREATER THAN 100.4 F (38 C) OR HIGHER *CHILLS OR SWEATING *NAUSEA AND VOMITING THAT IS NOT CONTROLLED WITH YOUR NAUSEA MEDICATION *UNUSUAL SHORTNESS OF BREATH *UNUSUAL BRUISING OR BLEEDING *URINARY PROBLEMS (pain or burning when urinating, or frequent urination) *BOWEL PROBLEMS (unusual diarrhea, constipation, pain near the anus) TENDERNESS IN MOUTH AND THROAT WITH OR WITHOUT PRESENCE OF ULCERS (sore throat, sores in mouth, or a toothache) UNUSUAL RASH, SWELLING OR PAIN  UNUSUAL VAGINAL DISCHARGE OR ITCHING   Items with * indicate a potential emergency and should be followed up as soon as possible or go to the Emergency Department if any problems should occur.  Please show the CHEMOTHERAPY ALERT CARD or IMMUNOTHERAPY ALERT CARD at  check-in to the Emergency Department and triage nurse.  Should you have questions after your visit or need to cancel or reschedule your appointment, please contact [facility]  Dept: [phone]  and follow the prompts.  Office hours are 8:00 a.m. to 4:30 p.m. Monday - Friday. Please note that voicemails left after 4:00 p.m. may not be returned until the following business day.  We are closed weekends and major holidays. You have access to a nurse at all times for urgent questions. Please call the main number to the clinic Dept: [phone] and follow the prompts.   For any non-urgent questions, you may also contact your provider using MyChart. We now offer e-Visits for anyone 18 and older to request care online for non-urgent symptoms. For details visit [email or web address].   Also download the MyChart app! Go to the app store, search "MyChart", open the app, select [place], and log in with your MyChart username and password. 

## 2023-03-26 NOTE — Progress Notes (Signed)
[facility] [phone]   [phone]  OFFICE PROGRESS NOTE  Pcp, [name], N3, [name], large right anterior mediastinal and supraclavicular lymphadenopathy as well as pancreatic and splenic metastasis diagnosed in September 2017.  The patient had disease progression in October 2019.  PRIOR THERAPY:  1) Systemic chemotherapy was carboplatin for AUC of 5 on day 1 and etoposide 100 MG/M2 on days 1, 2 and 3 with Neulasta support. Status post 6 cycles with significant response of her disease. 2) Prophylactic cranial irradiation under the care of Dr. Roselind Messier on 12/02/2016. 3) stereotactic radiotherapy to the recurrent right upper lobe pulmonary nodule under the care of Dr. Roselind Messier completed November 10, 2017. 4) Retreatment with systemic chemotherapy with carboplatin for AUC of 5 on day 1 and etoposide 100 mg/M2 on days 1, 2 and 3 as well as Tecentriq (Atezolizumab) 1200 mg IV every 3 weeks with Neulasta support.  First dose June 22, 2018 for disease recurrence.  Status post 5 cycles.  Starting from cycle #2 her dose of carboplatin will be reduced to AUC of 4 and etoposide 80 mg/M2 on days 1, 2 and 3 in addition to the regular dose of Tecentriq.  CURRENT THERAPY: Maintenance treatment with single agent Tecentriq 1200 mg IV every 3 weeks.  Status post 72 cycles.  INTERVAL HISTORY: Debra Kidd 62 y.o. female returns to the clinic today for follow-up visit accompanied by her husband.  The patient is feeling fine today with no concerning complaints.  She denied having any chest pain, shortness of breath, cough or hemoptysis.  She has no nausea, vomiting, diarrhea or constipation.  She is applying lidocaine patch to her back and it is helping some with her back pain.  The patient denied having any fever or chills.  She has no significant weight loss or night  sweats.  She has been tolerating her maintenance treatment with Tecentriq fairly well.  She is here today for evaluation with repeat CT scan of the chest, abdomen and pelvis for restaging of her disease.   MEDICAL HISTORY: Past Medical History:  Diagnosis Date   Basal cell carcinoma of cheek    L side of face   Blood type, Rh positive    Cancer (HCC)    Chronic pain syndrome    Depression 08/07/2016   Encounter for antineoplastic chemotherapy 07/17/2016   History of external beam radiation therapy 11/19/16-12/02/16   brain 25 Gy in 10 fractions   Hyperlipidemia    Hypertension    Hypertension 08/07/2016   Osteoporosis    Small cell lung cancer (HCC) dx'd 05/2016    ALLERGIES:  is allergic to codeine, motrin [ibuprofen], thiazide-type diuretics, and vicodin [hydrocodone-acetaminophen].  MEDICATIONS:  Current Outpatient Medications  Medication Sig Dispense Refill   atorvastatin (LIPITOR) 80 MG tablet TAKE ONE TABLET BY MOUTH EVERY DAY 30 tablet 11   clopidogrel (PLAVIX) 75 MG tablet Take 1 tablet (75 mg total) by mouth daily. 90 tablet 3   diazepam (VALIUM) 5 MG tablet Take 1 tablet (5 mg total) by mouth every 12 (twelve) hours as needed for anxiety. 45 tablet 0   lidocaine-prilocaine (EMLA) cream Apply 1 Application topically as needed. 30 g 2   oxyCODONE-acetaminophen (PERCOCET/ROXICET) 5-325 MG tablet Take 1 tablet by mouth every 6 (six) hours as needed for severe pain. 60 tablet 0   potassium chloride  20 MEQ/15ML (10%) SOLN Take 15 mLs (20 mEq total) by mouth 2 (two) times daily. 210 mL 0   traMADol (ULTRAM) 50 MG tablet Take 1 tablet (50 mg total) by mouth every 6 (six) hours as needed. 60 tablet 0   triamcinolone cream (KENALOG) 0.1 % Apply 1 Application topically 2 (two) times daily. 80 g 2   Vitamin D, Ergocalciferol, [name],000 Units by mouth 3 (three) times a week.     No current facility-administered medications for this visit.     SURGICAL HISTORY:  Past Surgical History:  Procedure Laterality Date   ABDOMINAL HYSTERECTOMY  1981   APPENDECTOMY     at age 86   EYE SURGERY     left   IR GENERIC HISTORICAL  06/03/2016   IR FLUORO GUIDE PORT INSERTION RIGHT 06/03/2016 [facility]   IR GENERIC HISTORICAL  06/03/2016   IR US GUIDE VASC ACCESS RIGHT 06/03/2016 [facility]   SKIN CANCER EXCISION     basal cell carcinoma L side of face    REVIEW OF SYSTEMS:  Constitutional: positive for fatigue Eyes: negative Ears, nose, mouth, throat, and face: negative Respiratory: negative Cardiovascular: negative Gastrointestinal: negative Genitourinary:negative Integument/breast: negative Hematologic/lymphatic: negative Musculoskeletal:positive for back pain Neurological: negative Behavioral/Psych: negative Endocrine: negative Allergic/Immunologic: negative   PHYSICAL EXAMINATION: General appearance: alert, cooperative, fatigued, and no distress Head: Normocephalic, without obvious abnormality, atraumatic Neck: no adenopathy, no JVD, supple, symmetrical, trachea midline, and thyroid not enlarged, symmetric, no tenderness/mass/nodules Lymph nodes: Cervical, supraclavicular, and axillary nodes normal. Resp: clear to auscultation bilaterally Back: symmetric, no curvature. ROM normal. No CVA tenderness. Cardio: regular rate and rhythm, S1, S2 normal, no murmur, click, rub or gallop GI: soft, non-tender; bowel sounds normal; no masses,  no organomegaly Extremities: extremities normal, atraumatic, no cyanosis or edema Neurologic: Alert and oriented X 3, normal strength and tone. Normal symmetric reflexes. Normal coordination and gait  ECOG PERFORMANCE STATUS: 1 - Symptomatic but completely ambulatory  Blood pressure 114/78, pulse 80, temperature 98.5 F (36.9 C), temperature source Oral, resp. rate 17, height 5\' 3"  (1.6 m), weight 98 lb 11.2 oz (44.8 kg), SpO2 100%.  LABORATORY DATA: Lab Results  Component Value  Date   WBC 6.9 03/26/2023   HGB 12.0 03/26/2023   HCT 35.0 (L) 03/26/2023   MCV 104.5 (H) 03/26/2023   PLT 235 03/26/2023      Chemistry      Component Value Date/Time   NA 139 03/26/2023 1038   NA 141 08/01/2017 0835   K 3.2 (L) 03/26/2023 1038   K 3.5 08/01/2017 0835   CL 104 03/26/2023 1038   CO2 28 03/26/2023 1038   CO2 25 08/01/2017 0835   BUN <5 (L) 03/26/2023 1038   BUN 8.0 08/01/2017 0835   CREATININE 0.78 03/26/2023 1038   CREATININE 0.9 08/01/2017 0835      Component Value Date/Time   CALCIUM 8.7 (L) 03/26/2023 1038   CALCIUM 9.9 08/01/2017 0835   ALKPHOS 118 03/26/2023 1038   ALKPHOS 142 08/01/2017 0835   AST 19 03/26/2023 1038   AST 15 08/01/2017 0835   ALT 13 03/26/2023 1038   ALT 19 08/01/2017 0835   BILITOT 0.4 03/26/2023 1038   BILITOT 0.35 08/01/2017 0835       RADIOGRAPHIC STUDIES: CT Chest W Contrast  Result Date: 03/25/2023 CLINICAL DATA:  Small cell lung cancer (SCLC), staging EXAM: CT CHEST, ABDOMEN, AND PELVIS WITH CONTRAST TECHNIQUE: Multidetector CT imaging  of the chest, abdomen and pelvis was performed following the standard protocol during bolus administration of intravenous contrast. RADIATION DOSE REDUCTION: This exam was performed according to the departmental dose-optimization program which includes automated exposure control, adjustment of the mA and/or kV according to patient size and/or use of iterative reconstruction technique. CONTRAST:  80mL OMNIPAQUE IOHEXOL 300 MG/ML  SOLN COMPARISON:  CT scan chest, abdomen and pelvis from 12/31/2022. FINDINGS: CT CHEST FINDINGS Cardiovascular: Normal cardiac size. No pericardial effusion. No aortic aneurysm. There are coronary artery calcifications, in keeping with coronary artery disease. There are also mild peripheral atherosclerotic vascular calcifications of thoracic aorta and its major branches. Redemonstration of several small intraluminal thrombus/hematomas in the aortic arch and abdominal  aorta. Mediastinum/Nodes: There is a stable peripherally rim calcified nodule in the right thyroid lobe. No solid / cystic mediastinal masses. The esophagus is nondistended precluding optimal assessment. No axillary, mediastinal or hilar lymphadenopathy by size criteria. Lungs/Pleura: The central tracheo-bronchial tree is patent. Redemonstration of right apical anterolateral opacity, stable since multiple prior studies. There are patchy areas of linear, plate-like atelectasis and/or scarring throughout bilateral lungs. No mass or consolidation. No pleural effusion or pneumothorax. No suspicious lung nodules. Musculoskeletal: A CT Port-a-Cath is seen in the right upper chest wall with the catheter terminating in the cavo-atrial junction region. Visualized soft tissues of the chest wall are otherwise grossly unremarkable. No suspicious osseous lesions. Mild-to-moderate compression deformities of T5 through T8 and L1 vertebra noted. Please note there is transitional lumbosacral element with complete sacralization of L5. There are mild multilevel degenerative changes in the visualized spine. CT ABDOMEN PELVIS FINDINGS Hepatobiliary: The liver is normal in size. Non-cirrhotic configuration. No suspicious mass. No intrahepatic or extrahepatic bile duct dilation. No calcified gallstones. Normal gallbladder wall thickness. No pericholecystic inflammatory changes. Pancreas: Unremarkable. No pancreatic ductal dilatation or surrounding inflammatory changes. Spleen: Within normal limits. No focal lesion. Adrenals/Urinary Tract: Adrenal glands are unremarkable. No suspicious renal mass. No hydronephrosis. No renal or ureteric calculi. Unremarkable urinary bladder. Stomach/Bowel: No disproportionate dilation of the small or large bowel loops. No evidence of abnormal bowel wall thickening or inflammatory changes. The appendix is surgically absent. Vascular/Lymphatic: No ascites or pneumoperitoneum. No abdominal or pelvic  lymphadenopathy, by size criteria. No aneurysmal dilation of the major abdominal arteries. There are moderate peripheral atherosclerotic vascular calcifications of the aorta and its major branches. Reproductive: The uterus is surgically absent. No large adnexal mass. Other: There is a tiny fat containing umbilical hernia. The soft tissues and abdominal wall are otherwise unremarkable. Musculoskeletal: No suspicious osseous lesions. There are mild multilevel degenerative changes in the visualized spine. IMPRESSION: 1. No metastatic disease identified within the chest, abdomen or pelvis. 2. Stable posttreatment changes in the right lung apex. 3. Multiple other nonacute observations, as described above. Electronically Signed   By: Jules Schick M.D.   On: 03/25/2023 08:54   CT Abdomen Pelvis W Contrast  Result Date: 03/25/2023 CLINICAL DATA:  Small cell lung cancer (SCLC), staging EXAM: CT CHEST, ABDOMEN, AND PELVIS WITH CONTRAST TECHNIQUE: Multidetector CT imaging of the chest, abdomen and pelvis was performed following the standard protocol during bolus administration of intravenous contrast. RADIATION DOSE REDUCTION: This exam was performed according to the departmental dose-optimization program which includes automated exposure control, adjustment of the mA and/or kV according to patient size and/or use of iterative reconstruction technique. CONTRAST:  80mL OMNIPAQUE IOHEXOL 300 MG/ML  SOLN COMPARISON:  CT scan chest, abdomen and pelvis from 12/31/2022. FINDINGS: CT CHEST FINDINGS  Cardiovascular: Normal cardiac size. No pericardial effusion. No aortic aneurysm. There are coronary artery calcifications, in keeping with coronary artery disease. There are also mild peripheral atherosclerotic vascular calcifications of thoracic aorta and its major branches. Redemonstration of several small intraluminal thrombus/hematomas in the aortic arch and abdominal aorta. Mediastinum/Nodes: There is a stable peripherally rim  calcified nodule in the right thyroid lobe. No solid / cystic mediastinal masses. The esophagus is nondistended precluding optimal assessment. No axillary, mediastinal or hilar lymphadenopathy by size criteria. Lungs/Pleura: The central tracheo-bronchial tree is patent. Redemonstration of right apical anterolateral opacity, stable since multiple prior studies. There are patchy areas of linear, plate-like atelectasis and/or scarring throughout bilateral lungs. No mass or consolidation. No pleural effusion or pneumothorax. No suspicious lung nodules. Musculoskeletal: A CT Port-a-Cath is seen in the right upper chest wall with the catheter terminating in the cavo-atrial junction region. Visualized soft tissues of the chest wall are otherwise grossly unremarkable. No suspicious osseous lesions. Mild-to-moderate compression deformities of T5 through T8 and L1 vertebra noted. Please note there is transitional lumbosacral element with complete sacralization of L5. There are mild multilevel degenerative changes in the visualized spine. CT ABDOMEN PELVIS FINDINGS Hepatobiliary: The liver is normal in size. Non-cirrhotic configuration. No suspicious mass. No intrahepatic or extrahepatic bile duct dilation. No calcified gallstones. Normal gallbladder wall thickness. No pericholecystic inflammatory changes. Pancreas: Unremarkable. No pancreatic ductal dilatation or surrounding inflammatory changes. Spleen: Within normal limits. No focal lesion. Adrenals/Urinary Tract: Adrenal glands are unremarkable. No suspicious renal mass. No hydronephrosis. No renal or ureteric calculi. Unremarkable urinary bladder. Stomach/Bowel: No disproportionate dilation of the small or large bowel loops. No evidence of abnormal bowel wall thickening or inflammatory changes. The appendix is surgically absent. Vascular/Lymphatic: No ascites or pneumoperitoneum. No abdominal or pelvic lymphadenopathy, by size criteria. No aneurysmal dilation of the major  abdominal arteries. There are moderate peripheral atherosclerotic vascular calcifications of the aorta and its major branches. Reproductive: The uterus is surgically absent. No large adnexal mass. Other: There is a tiny fat containing umbilical hernia. The soft tissues and abdominal wall are otherwise unremarkable. Musculoskeletal: No suspicious osseous lesions. There are mild multilevel degenerative changes in the visualized spine. IMPRESSION: 1. No metastatic disease identified within the chest, abdomen or pelvis. 2. Stable posttreatment changes in the right lung apex. 3. Multiple other nonacute observations, as described above. Electronically Signed   By: Jules Schick M.D.   On: 03/25/2023 08:54    ASSESSMENT AND PLAN:  This is a very pleasant 62 years old white female with extensive stage small cell lung cancer status post systemic chemotherapy with carbo platinum and etoposide for 6 cycles and the patient rotated her treatment well except for chemotherapy-induced anemia and requirement for PRBCs and platelet transfusion. She had significant improvement in her disease with the chemotherapy. She also had prophylactic cranial irradiation. She also underwent stereotactic radiotherapy to progressive right upper lobe pulmonary nodule. The patient has been in observation for close to 2 years. Repeat CT scan of the chest, abdomen and pelvis performed at that time showed evidence for disease progression in the abdomen. The patient started on systemic chemotherapy again with carboplatin, etoposide and Tecentriq status post 77 cycles.  Starting from cycle #5 the patient is treated with maintenance single agent Tecentriq. The patient has been tolerating this treatment well with no concerning adverse effects. She had repeat CT scan of the chest, abdomen and pelvis performed recently.  I personally and independently reviewed the scan and discussed the result  with the patient and her husband. Her scan showed no  concerning findings for disease progression. I recommended for her to continue her current maintenance treatment with Tecentriq and she would receive cycle #78 today. For the anxiety and pain management, she was referred to the palliative care team and she has an appointment with them today. For the history of a stroke she is currently on treatment with Plavix and aspirin and she is followed by neurology. The patient will come back for follow-up visit in 3 weeks for evaluation before the next cycle of her treatment. She was advised to call immediately if she has any concerning symptoms in the interval. All questions were answered. The patient knows to call the clinic with any problems, questions or concerns. We can certainly see the patient much sooner if necessary.  Disclaimer: This note was dictated with voice recognition software. Similar sounding words can inadvertently be transcribed and may not be corrected upon review.

## 2023-04-11 NOTE — Progress Notes (Unsigned)
[name], [name], N3, [name], [name], 2 and 3 with Neulasta support. Status post 6 cycles with significant response of her disease. 2) Prophylactic cranial irradiation under the care of Dr. Roselind Messier on 12/02/2016. 3) stereotactic radiotherapy to the recurrent right upper lobe pulmonary nodule under the care of Dr. Roselind Messier completed November 10, 2017. 4) Retreatment with systemic chemotherapy with carboplatin for AUC of 5 on day 1 and etoposide 100 mg/M2 on days 1, 2 and 3 as well as Tecentriq (Atezolizumab) 1200 mg IV every 3 weeks with Neulasta support.  First dose June 22, 2018 for disease recurrence.  Status post 5 cycles.  Starting from cycle #2 her dose of carboplatin will be reduced to AUC of 4 and etoposide 80 mg/M2 on days 1, [name], [name], Rh positive    Cancer (HCC)    Chronic pain syndrome    Depression 08/07/2016   Encounter for antineoplastic chemotherapy 07/17/2016   History of external beam radiation therapy 11/19/16-12/02/16   brain 25 Gy in 10 fractions   Hyperlipidemia    Hypertension    Hypertension 08/07/2016   Osteoporosis    Small cell lung cancer (HCC) dx'd 05/2016    ALLERGIES:  is allergic to codeine, motrin [ibuprofen], thiazide-type diuretics, [name], Ergocalciferol, [name],000 Units by mouth 3 (three) times a week.     No current facility-administered medications for this visit.    SURGICAL HISTORY:  Past Surgical History:  Procedure Laterality Date   ABDOMINAL HYSTERECTOMY  1981   APPENDECTOMY     at age 75   EYE SURGERY     left   IR GENERIC HISTORICAL  06/03/2016   IR FLUORO GUIDE PORT INSERTION RIGHT 06/03/2016 [facility]   IR GENERIC HISTORICAL  06/03/2016   IR US GUIDE VASC ACCESS RIGHT 06/03/2016 [facility]   SKIN CANCER EXCISION     basal cell carcinoma L side of face    REVIEW OF SYSTEMS:   Review of Systems  Constitutional: Negative for appetite change, chills, fatigue, [name], nosebleeds, sore throat and trouble swallowing.   Eyes: Negative for eye problems and icterus.  Respiratory: Negative for cough, hemoptysis, [name], constipation, diarrhea, nausea and vomiting.  Genitourinary: Negative for bladder incontinence, difficulty urinating, dysuria, frequency and hematuria.   Musculoskeletal: Negative for back pain, gait problem, neck pain and neck stiffness.  Skin: Negative for itching and rash.  Neurological: Negative for dizziness, extremity weakness, gait problem, headaches, [name], [name], place, and time and well-developed, well-nourished, and in no distress. No distress.  HENT:  Head: Normocephalic and atraumatic.  Mouth/Throat: Oropharynx is clear and moist. No oropharyngeal exudate.  Eyes: Conjunctivae are normal. Right eye exhibits no discharge. Left eye exhibits no discharge. No scleral icterus.  Neck: Normal range of motion. Neck supple.  Cardiovascular: Normal rate, regular rhythm, normal heart sounds and intact distal pulses.   Pulmonary/Chest: Effort normal and breath sounds normal. No respiratory distress. No wheezes. No rales.  Abdominal: Soft. Bowel sounds are normal. Exhibits no distension and no mass. There is no tenderness.  Musculoskeletal: Normal range of motion. Exhibits no edema.  Lymphadenopathy:    No cervical adenopathy.  Neurological: Alert and oriented to person, place, and time. Exhibits normal muscle tone. Gait normal. Coordination normal.  Skin: Skin is warm and dry. No rash noted. Not diaphoretic. No erythema. No pallor.  Psychiatric: Mood, memory and judgment  normal.  Vitals reviewed.  LABORATORY DATA: Lab Results  Component Value Date   WBC 6.9 03/26/2023   HGB 12.0 03/26/2023   HCT 35.0 (L) 03/26/2023   MCV 104.5 (H) 03/26/2023   PLT 235 03/26/2023      Chemistry      Component Value Date/Time   NA 139 03/26/2023 1038   NA 141 08/01/2017 0835   K 3.2 (L) 03/26/2023 1038   K 3.5 08/01/2017 0835   CL 104 03/26/2023 1038   CO2 28 03/26/2023 1038   CO2 25 08/01/2017 0835   BUN <5 (L) 03/26/2023 1038   BUN 8.0 08/01/2017 0835   CREATININE 0.78 03/26/2023 1038   CREATININE 0.9 08/01/2017 0835      Component Value Date/Time   CALCIUM 8.7 (L) 03/26/2023 1038   CALCIUM 9.9 08/01/2017 0835   ALKPHOS 118 03/26/2023 1038   ALKPHOS 142 08/01/2017 0835   AST 19  03/26/2023 1038   AST 15 08/01/2017 0835   ALT 13 03/26/2023 1038   ALT 19 08/01/2017 0835   BILITOT 0.4 03/26/2023 1038   BILITOT 0.35 08/01/2017 0835       RADIOGRAPHIC STUDIES:  CT Chest W Contrast  Result Date: 03/25/2023 CLINICAL DATA:  Small cell lung cancer (SCLC), staging EXAM: CT CHEST, ABDOMEN, AND PELVIS WITH CONTRAST TECHNIQUE: Multidetector CT imaging of the chest, abdomen and pelvis was performed following the standard protocol during bolus administration of intravenous contrast. RADIATION DOSE REDUCTION: This exam was performed according to the departmental dose-optimization program which includes automated exposure control, adjustment of the mA and/or kV according to patient size and/or use of iterative reconstruction technique. CONTRAST:  80mL OMNIPAQUE IOHEXOL 300 MG/ML  SOLN COMPARISON:  CT scan chest, abdomen and pelvis from 12/31/2022. FINDINGS: CT CHEST FINDINGS Cardiovascular: Normal cardiac size. No pericardial effusion. No aortic aneurysm. There are coronary artery calcifications, in keeping with coronary artery disease. There are also mild peripheral atherosclerotic vascular calcifications of thoracic aorta and its major branches. Redemonstration of several small intraluminal thrombus/hematomas in the aortic arch and abdominal aorta. Mediastinum/Nodes: There is a stable peripherally rim calcified nodule in the right thyroid lobe. No solid / cystic mediastinal masses. The esophagus is nondistended precluding optimal assessment. No axillary, mediastinal or hilar lymphadenopathy by size criteria. Lungs/Pleura: The central tracheo-bronchial tree is patent. Redemonstration of right apical anterolateral opacity, stable since multiple prior studies. There are patchy areas of linear, plate-like atelectasis and/or scarring throughout bilateral lungs. No mass or consolidation. No pleural effusion or pneumothorax. No suspicious lung nodules. Musculoskeletal: A CT Port-a-Cath is seen in  the right upper chest wall with the catheter terminating in the cavo-atrial junction region. Visualized soft tissues of the chest wall are otherwise grossly unremarkable. No suspicious osseous lesions. Mild-to-moderate compression deformities of T5 through T8 and L1 vertebra noted. Please note there is transitional lumbosacral element with complete sacralization of L5. There are mild multilevel degenerative changes in the visualized spine. CT ABDOMEN PELVIS FINDINGS Hepatobiliary: The liver is normal in size. Non-cirrhotic configuration. No suspicious mass. No intrahepatic or extrahepatic bile duct dilation. No calcified gallstones. Normal gallbladder wall thickness. No pericholecystic inflammatory changes. Pancreas: Unremarkable. No pancreatic ductal dilatation or surrounding inflammatory changes. Spleen: Within normal limits. No focal lesion. Adrenals/Urinary Tract: Adrenal glands are unremarkable. No suspicious renal mass. No hydronephrosis. No renal or ureteric calculi. Unremarkable urinary bladder. Stomach/Bowel: No disproportionate dilation of the small or large bowel loops. No evidence of abnormal bowel wall thickening or inflammatory changes. The appendix is surgically  absent. Vascular/Lymphatic: No ascites or pneumoperitoneum. No abdominal or pelvic lymphadenopathy, by size criteria. No aneurysmal dilation of the major abdominal arteries. There are moderate peripheral atherosclerotic vascular calcifications of the aorta and its major branches. Reproductive: The uterus is surgically absent. No large adnexal mass. Other: There is a tiny fat containing umbilical hernia. The soft tissues and abdominal wall are otherwise unremarkable. Musculoskeletal: No suspicious osseous lesions. There are mild multilevel degenerative changes in the visualized spine. IMPRESSION: 1. No metastatic disease identified within the chest, abdomen or pelvis. 2. Stable posttreatment changes in the right lung apex. 3. Multiple other  nonacute observations, as described above. Electronically Signed   By: Jules Schick M.D.   On: 03/25/2023 08:54   CT Abdomen Pelvis W Contrast  Result Date: 03/25/2023 CLINICAL DATA:  Small cell lung cancer (SCLC), staging EXAM: CT CHEST, ABDOMEN, AND PELVIS WITH CONTRAST TECHNIQUE: Multidetector CT imaging of the chest, abdomen and pelvis was performed following the standard protocol during bolus administration of intravenous contrast. RADIATION DOSE REDUCTION: This exam was performed according to the departmental dose-optimization program which includes automated exposure control, adjustment of the mA and/or kV according to patient size and/or use of iterative reconstruction technique. CONTRAST:  80mL OMNIPAQUE IOHEXOL 300 MG/ML  SOLN COMPARISON:  CT scan chest, abdomen and pelvis from 12/31/2022. FINDINGS: CT CHEST FINDINGS Cardiovascular: Normal cardiac size. No pericardial effusion. No aortic aneurysm. There are coronary artery calcifications, in keeping with coronary artery disease. There are also mild peripheral atherosclerotic vascular calcifications of thoracic aorta and its major branches. Redemonstration of several small intraluminal thrombus/hematomas in the aortic arch and abdominal aorta. Mediastinum/Nodes: There is a stable peripherally rim calcified nodule in the right thyroid lobe. No solid / cystic mediastinal masses. The esophagus is nondistended precluding optimal assessment. No axillary, mediastinal or hilar lymphadenopathy by size criteria. Lungs/Pleura: The central tracheo-bronchial tree is patent. Redemonstration of right apical anterolateral opacity, stable since multiple prior studies. There are patchy areas of linear, plate-like atelectasis and/or scarring throughout bilateral lungs. No mass or consolidation. No pleural effusion or pneumothorax. No suspicious lung nodules. Musculoskeletal: A CT Port-a-Cath is seen in the right upper chest wall with the catheter terminating in the  cavo-atrial junction region. Visualized soft tissues of the chest wall are otherwise grossly unremarkable. No suspicious osseous lesions. Mild-to-moderate compression deformities of T5 through T8 and L1 vertebra noted. Please note there is transitional lumbosacral element with complete sacralization of L5. There are mild multilevel degenerative changes in the visualized spine. CT ABDOMEN PELVIS FINDINGS Hepatobiliary: The liver is normal in size. Non-cirrhotic configuration. No suspicious mass. No intrahepatic or extrahepatic bile duct dilation. No calcified gallstones. Normal gallbladder wall thickness. No pericholecystic inflammatory changes. Pancreas: Unremarkable. No pancreatic ductal dilatation or surrounding inflammatory changes. Spleen: Within normal limits. No focal lesion. Adrenals/Urinary Tract: Adrenal glands are unremarkable. No suspicious renal mass. No hydronephrosis. No renal or ureteric calculi. Unremarkable urinary bladder. Stomach/Bowel: No disproportionate dilation of the small or large bowel loops. No evidence of abnormal bowel wall thickening or inflammatory changes. The appendix is surgically absent. Vascular/Lymphatic: No ascites or pneumoperitoneum. No abdominal or pelvic lymphadenopathy, by size criteria. No aneurysmal dilation of the major abdominal arteries. There are moderate peripheral atherosclerotic vascular calcifications of the aorta and its major branches. Reproductive: The uterus is surgically absent. No large adnexal mass. Other: There is a tiny fat containing umbilical hernia. The soft tissues and abdominal wall are otherwise unremarkable. Musculoskeletal: No suspicious osseous lesions. There are mild multilevel degenerative changes  in the visualized spine. IMPRESSION: 1. No metastatic disease identified within the chest, abdomen or pelvis. 2. Stable posttreatment changes in the right lung apex. 3. Multiple other nonacute observations, as described above. Electronically Signed    By: Jules Schick M.D.   On: 03/25/2023 08:54     ASSESSMENT/PLAN:  This is a very pleasant 62 year old Caucasian female with extensive stage small cell lung cancer.  She presented with a right upper lobe lung mass, large right anterior mediastinal and supraclavicular lymphadenopathy as well as a pancreatic and splenic metastasis.  She was diagnosed in September 2017.    The patient underwent systemic chemotherapy with carboplatin and etoposide.  She is status post 6 cycles.  She tolerated treatment well except for chemotherapy-induced anemia which required  pRBCs and platelet transfusions.  She had a significant improvement of her disease with chemotherapy. The patient then underwent prophylactic cranial irradiation. She then underwent stereotactic radiotherapy to the right upper lobe and pulmonary nodule.  She had been on observation for 2 years before showing evidence of disease progression.    She then was started on systemic chemotherapy with carboplatin, etoposide, and Tecentriq. Starting from cycle #5, she has been on maintenance single agent Tecentriq.  She has been tolerating treatment well. She is status post 78 cycles of Tecentriq total.     Labs were reviewed. Recommend that she proceed with cycle #76 today as schedule.    She is scheduled to see palliative care today.  She is scheduled to see nutrition today as well.    She has Kenalog cream for her itching on her back likely secondary to her immunotherapy.  The patient was advised to call immediately if she has any concerning symptoms in the interval. The patient voices understanding of current disease status and treatment options and is in agreement with the current care plan. All questions were answered. The patient knows to call the clinic with any problems, questions or concerns. We can certainly see the patient much sooner if necessary       No orders of the defined types were placed in this encounter.    I spent  {CHL ONC TIME VISIT - ONGEX:[id number]} counseling the patient face to face. The total time spent in the appointment was {CHL ONC TIME VISIT - NUUVO:[id number]}.   L , PA-C 04/11/23

## 2023-04-14 NOTE — Progress Notes (Unsigned)
Palliative Medicine [facility]  Telephone:([phone] Fax:([phone]) [phone]   Name: Debra Kidd Date: 04/14/2023 MRN: [id number]  DOB: 03/15/1961  Patient Care Team: Pcp, No as PCP - General    INTERVAL HISTORY: Debra Kidd is a 62 y.o. 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  Palliative ask to see for symptom management and goals of care.   SOCIAL HISTORY:     reports that she has been smoking cigarettes. She has a 86 pack-year smoking history. She has never used smokeless tobacco. She reports that she does not currently use alcohol. She reports current drug use. Frequency: 7.00 times per week. Drug: Marijuana.  ADVANCE DIRECTIVES:    CODE STATUS:   PAST MEDICAL HISTORY: Past Medical History:  Diagnosis Date   Basal cell carcinoma of cheek    L side of face   Blood type, Rh positive    Cancer (HCC)    Chronic pain syndrome    Depression 08/07/2016   Encounter for antineoplastic chemotherapy 07/17/2016   History of external beam radiation therapy 11/19/16-12/02/16   brain 25 Gy in 10 fractions   Hyperlipidemia    Hypertension    Hypertension 08/07/2016   Osteoporosis    Small cell lung cancer (HCC) dx'd 05/2016    ALLERGIES:  is allergic to codeine, motrin [ibuprofen], thiazide-type diuretics, and vicodin [hydrocodone-acetaminophen].  MEDICATIONS:  Current Outpatient Medications  Medication Sig Dispense Refill   atorvastatin (LIPITOR) 80 MG tablet TAKE ONE TABLET BY MOUTH EVERY DAY 30 tablet 11   clopidogrel (PLAVIX) 75 MG tablet Take 1 tablet (75 mg total) by mouth daily. 90 tablet 3   diazepam (VALIUM) 5 MG tablet Take 1 tablet (5 mg total) by mouth every 8 (eight) hours as needed for anxiety. 45 tablet 0   lidocaine-prilocaine (EMLA) cream Apply 1  Application topically as needed. 30 g 2   oxyCODONE-acetaminophen (PERCOCET/ROXICET) 5-325 MG tablet Take 1 tablet by mouth every 6 (six) hours as needed for severe pain. 60 tablet 0   potassium chloride 20 MEQ/15ML (10%) SOLN Take 15 mLs (20 mEq total) by mouth 2 (two) times daily. 210 mL 0   traMADol (ULTRAM) 50 MG tablet Take 1 tablet (50 mg total) by mouth every 6 (six) hours as needed. 60 tablet 0   triamcinolone cream (KENALOG) 0.1 % Apply 1 Application topically 2 (two) times daily. 80 g 2   Vitamin D, Ergocalciferol, [name],000 Units by mouth 3 (three) times a week.     No current facility-administered medications for this visit.    VITAL SIGNS: There were no vitals taken for this visit. There were no vitals filed for this visit.  Estimated body mass index is 17.48 kg/m as calculated from the following:   Height as of 03/26/23: 5\' 3"  (1.6 m).   Weight as of 03/26/23: 98 lb 11.2 oz (44.8 kg).   PERFORMANCE STATUS (ECOG) : 1 - Symptomatic but completely ambulatory  Assessment NAD, sitting in recliner Normal breathing pattern RRR AAO x4  IMPRESSION:  Debra Kidd presented to clinic for follow-up. No acute distress. Denies nausea, vomiting, constipation, or diarrhea. Is trying to remain as active as possible. Appetite is good. Denies insomnia.   Chronic pain Debra Kidd endorses pain being well controlled.  Some days are  better than others.  Associates increased pain with increased activity.    We reviewed patient's current regimen.  She is taking Oxycodone 5/325 as needed for severe pain. Is not requiring regularly. Last refilled in 6/11. Tramadol for moderate pain. Takes at least one-two tablets daily.  Will continue to closely monitor and adjust medications as needed.   Constipation Controlled with daily regimen.    Anxiety/Insomnia Controlled. Taking Valium as needed. Feels her anxiety has increased over the past month. States she  will sometimes wake up early in the morning and will have to calm herself down from shaking.   I discussed the importance of continued conversation with family and their medical providers regarding overall plan of care and treatment options, ensuring decisions are within the context of the patients values and GOCs.  PLAN:  Oxycodone 5/325 1 tablet every 6 hours as needed for severe pain. Does not require regularly. Last refilled 6/11 Tramadol 50mg  every 8 hours as needed for moderate insurance.  Valium 5 mg 1 tablet every 8 hours as needed for anxiety/insomnia (does not require daily) Pain contract completed. Senna S daily for bowel regimen I will plan to see patient back in 3-4 weeks in collaboration to other oncology appointments   Patient expressed understanding and was in agreement with this plan. She also understands that She can call the clinic at any time with any questions, concerns, or complaints.   Any controlled substances utilized were prescribed in the context of palliative care. PDMP has been reviewed.    Visit consisted of counseling and education dealing with the complex and emotionally intense issues of symptom management and palliative care in the setting of serious and potentially life-threatening illness.Greater than 50%  of this time was spent counseling and coordinating care related to the above assessment and plan.  [name], AGPCNP-BC  Palliative Medicine Team/[facility]  *Please note that this is a verbal dictation therefore any spelling or grammatical errors are due to the "[employer]" system interpretation.

## 2023-04-16 ENCOUNTER — Inpatient Hospital Stay (HOSPITAL_BASED_OUTPATIENT_CLINIC_OR_DEPARTMENT_OTHER): Payer: Medicare Other | Admitting: Physician Assistant

## 2023-04-16 ENCOUNTER — Inpatient Hospital Stay: Payer: Medicare Other

## 2023-04-16 ENCOUNTER — Encounter: Payer: Self-pay | Admitting: Nurse Practitioner

## 2023-04-16 ENCOUNTER — Inpatient Hospital Stay (HOSPITAL_BASED_OUTPATIENT_CLINIC_OR_DEPARTMENT_OTHER): Payer: Medicare Other | Admitting: Nurse Practitioner

## 2023-04-16 ENCOUNTER — Inpatient Hospital Stay: Payer: Medicare Other | Admitting: Dietician

## 2023-04-16 ENCOUNTER — Other Ambulatory Visit: Payer: Self-pay

## 2023-04-16 ENCOUNTER — Inpatient Hospital Stay: Payer: Medicare Other | Attending: Internal Medicine

## 2023-04-16 VITALS — BP 108/69 | HR 69 | Resp 16

## 2023-04-16 VITALS — BP 93/76 | HR 82 | Temp 97.3°F | Resp 16 | Wt 97.4 lb

## 2023-04-16 DIAGNOSIS — R197 Diarrhea, unspecified: Secondary | ICD-10-CM | POA: Diagnosis not present

## 2023-04-16 DIAGNOSIS — I10 Essential (primary) hypertension: Secondary | ICD-10-CM | POA: Diagnosis not present

## 2023-04-16 DIAGNOSIS — Z923 Personal history of irradiation: Secondary | ICD-10-CM | POA: Diagnosis not present

## 2023-04-16 DIAGNOSIS — C3411 Malignant neoplasm of upper lobe, right bronchus or lung: Secondary | ICD-10-CM | POA: Diagnosis not present

## 2023-04-16 DIAGNOSIS — G893 Neoplasm related pain (acute) (chronic): Secondary | ICD-10-CM

## 2023-04-16 DIAGNOSIS — I959 Hypotension, unspecified: Secondary | ICD-10-CM | POA: Diagnosis not present

## 2023-04-16 DIAGNOSIS — C3491 Malignant neoplasm of unspecified part of right bronchus or lung: Secondary | ICD-10-CM

## 2023-04-16 DIAGNOSIS — R110 Nausea: Secondary | ICD-10-CM

## 2023-04-16 DIAGNOSIS — Z5112 Encounter for antineoplastic immunotherapy: Secondary | ICD-10-CM | POA: Diagnosis present

## 2023-04-16 DIAGNOSIS — E876 Hypokalemia: Secondary | ICD-10-CM | POA: Diagnosis not present

## 2023-04-16 DIAGNOSIS — Z79899 Other long term (current) drug therapy: Secondary | ICD-10-CM | POA: Insufficient documentation

## 2023-04-16 DIAGNOSIS — M810 Age-related osteoporosis without current pathological fracture: Secondary | ICD-10-CM | POA: Insufficient documentation

## 2023-04-16 DIAGNOSIS — C778 Secondary and unspecified malignant neoplasm of lymph nodes of multiple regions: Secondary | ICD-10-CM | POA: Insufficient documentation

## 2023-04-16 DIAGNOSIS — E785 Hyperlipidemia, unspecified: Secondary | ICD-10-CM | POA: Diagnosis not present

## 2023-04-16 DIAGNOSIS — Z85828 Personal history of other malignant neoplasm of skin: Secondary | ICD-10-CM | POA: Diagnosis not present

## 2023-04-16 DIAGNOSIS — Z7902 Long term (current) use of antithrombotics/antiplatelets: Secondary | ICD-10-CM | POA: Diagnosis not present

## 2023-04-16 DIAGNOSIS — R059 Cough, unspecified: Secondary | ICD-10-CM | POA: Insufficient documentation

## 2023-04-16 DIAGNOSIS — D6481 Anemia due to antineoplastic chemotherapy: Secondary | ICD-10-CM | POA: Diagnosis not present

## 2023-04-16 DIAGNOSIS — C7889 Secondary malignant neoplasm of other digestive organs: Secondary | ICD-10-CM | POA: Insufficient documentation

## 2023-04-16 DIAGNOSIS — R0609 Other forms of dyspnea: Secondary | ICD-10-CM | POA: Diagnosis not present

## 2023-04-16 DIAGNOSIS — Z9221 Personal history of antineoplastic chemotherapy: Secondary | ICD-10-CM | POA: Diagnosis not present

## 2023-04-16 DIAGNOSIS — R5383 Other fatigue: Secondary | ICD-10-CM

## 2023-04-16 DIAGNOSIS — F17210 Nicotine dependence, cigarettes, uncomplicated: Secondary | ICD-10-CM | POA: Diagnosis not present

## 2023-04-16 DIAGNOSIS — Z515 Encounter for palliative care: Secondary | ICD-10-CM | POA: Diagnosis not present

## 2023-04-16 LAB — CMP (CANCER CENTER ONLY)
ALT: 14 U/L (ref 0–44)
AST: 19 U/L (ref 15–41)
Albumin: 3.6 g/dL (ref 3.5–5.0)
Alkaline Phosphatase: 126 U/L (ref 38–126)
Anion gap: 6 (ref 5–15)
BUN: 5 mg/dL — ABNORMAL LOW (ref 8–23)
CO2: 31 mmol/L (ref 22–32)
Calcium: 8.2 mg/dL — ABNORMAL LOW (ref 8.9–10.3)
Chloride: 102 mmol/L (ref 98–111)
Creatinine: 0.66 mg/dL (ref 0.44–1.00)
GFR, Estimated: >60 mL/min (ref >60)
Glucose, Bld: 115 mg/dL — ABNORMAL HIGH (ref 70–99)
Potassium: 2.8 mmol/L — ABNORMAL LOW (ref 3.5–5.1)
Sodium: 139 mmol/L (ref 135–145)
Total Bilirubin: 0.5 mg/dL (ref 0.3–1.2)
Total Protein: 6.2 g/dL — ABNORMAL LOW (ref 6.5–8.1)

## 2023-04-16 LAB — CBC WITH DIFFERENTIAL (CANCER CENTER ONLY)
Abs Immature Granulocytes: 0.02 10*3/uL (ref 0.00–0.07)
Basophils Absolute: 0 10*3/uL (ref 0.0–0.1)
Basophils Relative: 0 %
Eosinophils Absolute: 0.1 10*3/uL (ref 0.0–0.5)
Eosinophils Relative: 2 %
HCT: 33 % — ABNORMAL LOW (ref 36.0–46.0)
Hemoglobin: 11.4 g/dL — ABNORMAL LOW (ref 12.0–15.0)
Immature Granulocytes: 0 %
Lymphocytes Relative: 27 %
Lymphs Abs: 1.7 10*3/uL (ref 0.7–4.0)
MCH: 35.3 pg — ABNORMAL HIGH (ref 26.0–34.0)
MCHC: 34.5 g/dL (ref 30.0–36.0)
MCV: 102.2 fL — ABNORMAL HIGH (ref 80.0–100.0)
Monocytes Absolute: 0.3 10*3/uL (ref 0.1–1.0)
Monocytes Relative: 5 %
Neutro Abs: 4 10*3/uL (ref 1.7–7.7)
Neutrophils Relative %: 66 %
Platelet Count: 232 10*3/uL (ref 150–400)
RBC: 3.23 MIL/uL — ABNORMAL LOW (ref 3.87–5.11)
RDW: 13.4 % (ref 11.5–15.5)
WBC Count: 6.1 10*3/uL (ref 4.0–10.5)
nRBC: 0 % (ref 0.0–0.2)

## 2023-04-16 LAB — TSH: TSH: 1.68 u[IU]/mL (ref 0.350–4.500)

## 2023-04-16 MED ORDER — TRAMADOL HCL 50 MG PO TABS: 50.0000 mg | ORAL_TABLET | Freq: Four times a day (QID) | ORAL | 0 refills | Status: DC | PRN

## 2023-04-16 MED ORDER — SODIUM CHLORIDE 0.9% FLUSH
10.0000 mL | INTRAVENOUS | Status: DC | PRN
  Administered 2023-04-16: 10 mL

## 2023-04-16 MED ORDER — POTASSIUM CHLORIDE 20 MEQ/15ML (10%) PO SOLN: 20.0000 meq | Freq: Two times a day (BID) | ORAL | 0 refills | Status: DC

## 2023-04-16 MED ORDER — HEPARIN SOD (PORK) LOCK FLUSH 100 UNIT/ML IV SOLN
500.0000 [IU] | Freq: Once | INTRAVENOUS | Status: AC | PRN
  Administered 2023-04-16: 500 [IU]

## 2023-04-16 MED ORDER — SODIUM CHLORIDE 0.9 % IV SOLN: Freq: Once | INTRAVENOUS | Status: AC

## 2023-04-16 MED ORDER — ONDANSETRON HCL 8 MG PO TABS
8.0000 mg | ORAL_TABLET | Freq: Three times a day (TID) | ORAL | 0 refills | Status: DC | PRN
Start: 2023-04-16 — End: 2023-12-13

## 2023-04-16 MED ORDER — SODIUM CHLORIDE 0.9 % IV SOLN
1200.0000 mg | Freq: Once | INTRAVENOUS | Status: AC
  Administered 2023-04-16: 1200 mg via INTRAVENOUS
  Filled 2023-04-16: qty 20

## 2023-04-16 NOTE — Progress Notes (Signed)
Potassium 2.8 mmol/L today, Cassie PA-C sent Rx for potassium supplement to Pt's pharmacy. This RN educated Pt and Pt's family to start potassium supplement ASAP per Cassie PA-C's recommendation. Pt and family verbalized understanding.   Per Cassie PA-C OK to infuse extra IVFs at 500 mL/hr concurrently with tx. Per Cassie PA-C OK to stop extra IVF infusion and d/c Pt once Tecentriq infusion complete.

## 2023-04-16 NOTE — Patient Instructions (Signed)
[facility]  Discharge Instructions: Thank you for choosing [facility] to provide your oncology and hematology care.   If you have a lab appointment with the Cancer Center, please go directly to the Cancer Center and check in at the registration area.   Wear comfortable clothing and clothing appropriate for easy access to any Portacath or PICC line.   We strive to give you quality time with your provider. You may need to reschedule your appointment if you arrive late (15 or more minutes).  Arriving late affects you and other patients whose appointments are after yours.  Also, if you miss three or more appointments without notifying the office, you may be dismissed from the clinic at the provider's discretion.      For prescription refill requests, have your pharmacy contact our office and allow 72 hours for refills to be completed.    Today you received the following chemotherapy and/or immunotherapy agents Tecentriq      To help prevent nausea and vomiting after your treatment, we encourage you to take your nausea medication as directed.  BELOW ARE SYMPTOMS THAT SHOULD BE REPORTED IMMEDIATELY: *FEVER GREATER THAN 100.4 F (38 C) OR HIGHER *CHILLS OR SWEATING *NAUSEA AND VOMITING THAT IS NOT CONTROLLED WITH YOUR NAUSEA MEDICATION *UNUSUAL SHORTNESS OF BREATH *UNUSUAL BRUISING OR BLEEDING *URINARY PROBLEMS (pain or burning when urinating, or frequent urination) *BOWEL PROBLEMS (unusual diarrhea, constipation, pain near the anus) TENDERNESS IN MOUTH AND THROAT WITH OR WITHOUT PRESENCE OF ULCERS (sore throat, sores in mouth, or a toothache) UNUSUAL RASH, SWELLING OR PAIN  UNUSUAL VAGINAL DISCHARGE OR ITCHING   Items with * indicate a potential emergency and should be followed up as soon as possible or go to the Emergency Department if any problems should occur.  Please show the CHEMOTHERAPY ALERT CARD or IMMUNOTHERAPY ALERT CARD at  check-in to the Emergency Department and triage nurse.  Should you have questions after your visit or need to cancel or reschedule your appointment, please contact [facility]  Dept: [phone]  and follow the prompts.  Office hours are 8:00 a.m. to 4:30 p.m. Monday - Friday. Please note that voicemails left after 4:00 p.m. may not be returned until the following business day.  We are closed weekends and major holidays. You have access to a nurse at all times for urgent questions. Please call the main number to the clinic Dept: [phone] and follow the prompts.   For any non-urgent questions, you may also contact your provider using MyChart. We now offer e-Visits for anyone 18 and older to request care online for non-urgent symptoms. For details visit [email or web address].   Also download the MyChart app! Go to the app store, search "MyChart", open the app, select [place], and log in with your MyChart username and password. 

## 2023-04-16 NOTE — Progress Notes (Signed)
Nutrition Follow-up:  Patient with extensive stage small cell lung cancer. She is currently receiving maintenance treatment with single agent Tecentriq 1200 mg IV every 3 weeks.     Met with patient in infusion. Husband is with her today. Pt reports recent bout of diarrhea that lasted a few days. This has resolved. Pt eating better last couple of days per husband. Pt wanting a hotdog from [employer]. She is out of Ensure and unable to afford them. Previously drinking 1-2 daily.    Medications: reviewed  Labs: K 3.2, glucose 115, BUN 5  Anthropometrics: Wt 97 lb 6.4 oz today - trending down  7/24 - 98 lb 11.2 oz 7/3 - 100 lb 3.2 oz 6/11 - 99 lb 12.8 oz    NUTRITION DIAGNOSIS: Unintended wt loss - ongoing   INTERVENTION:  Encouraged high calorie high protein foods  Drink 1-2 Ensure Plus HP for added calories/protein - samples provided  One bad of food from [employer]     MONITORING, EVALUATION, GOAL: wt trends, intake   NEXT VISIT: Wednesday September 4 during infusion

## 2023-04-23 ENCOUNTER — Other Ambulatory Visit: Payer: Self-pay | Admitting: Adult Health

## 2023-05-02 NOTE — Progress Notes (Unsigned)
[facility] OFFICE PROGRESS NOTE  Pcp, [name], N3, [name], large right anterior mediastinal and supraclavicular lymphadenopathy as well as pancreatic and splenic metastasis diagnosed in September 2017.  The patient had disease progression in October 2019.   PRIOR THERAPY: 1) Systemic chemotherapy was carboplatin for AUC of 5 on day 1 and etoposide 100 MG/M2 on days 1, 2 and 3 with Neulasta support. Status post 6 cycles with significant response of her disease. 2) Prophylactic cranial irradiation under the care of Dr. Roselind Messier on 12/02/2016. 3) stereotactic radiotherapy to the recurrent right upper lobe pulmonary nodule under the care of Dr. Roselind Messier completed November 10, 2017. 4) Retreatment with systemic chemotherapy with carboplatin for AUC of 5 on day 1 and etoposide 100 mg/M2 on days 1, 2 and 3 as well as Tecentriq (Atezolizumab) 1200 mg IV every 3 weeks with Neulasta support.  First dose June 22, 2018 for disease recurrence.  Status post 5 cycles.  Starting from cycle #2 her dose of carboplatin will be reduced to AUC of 4 and etoposide 80 mg/M2 on days 1, 2 and 3 in addition to the regular dose of Tecentriq.  CURRENT THERAPY: Maintenance treatment with single agent Tecentriq 1200 mg IV every 3 weeks.  Status post 79 cycles.    INTERVAL HISTORY: Debra Kidd 62 y.o. female returns to the clinic today for a follow-up visit accompanied by her husband.  The patient is currently being followed for metastatic small cell lung cancer for several years for which she has been on maintenance Tecentriq IV every 3 weeks.  She does tolerate this well except for dry skin and itching. She uses kenalog cream.  For being frail and cachectic appearing, Benadryl does not seem to bother her and she also uses Benadryl for itching.  She is followed closely by palliative care and nutrition. She  is scheduled to see attrition today.  She is scheduled to see palliative care at her next appointment.  She has a lot of financial constraints.  We were discussing the cost of some of her medications today and if there were any assistance she would qualify for at this time.  She denies any fever or night sweats. She reports she is cold natured and denies changes. She denies any nausea or vomiting unless she is taking her pain medication on an empty stomach.  She reports her baseline dyspnea on exertion which is stable without any chest pain or hemoptysis.  She reports a baseline mild cough. She continues to smoke approximately 2 packs of cigarettes per day. She has intermittent diarrhea averaging once a week. She frequently has hypokalemia on labs.  She cannot tolerate large pills and therefore takes liquid potassium.  He continues to have baseline fatigue and she is overall deconditioned and not very active at home.   She is here for evaluation and repeat blood work before starting cycle #80   MEDICAL HISTORY: Past Medical History:  Diagnosis Date   Basal cell carcinoma of cheek    L side of face   Blood type, Rh positive    Cancer (HCC)    Chronic pain syndrome    Depression 08/07/2016   Encounter for antineoplastic chemotherapy 07/17/2016   History of external beam radiation therapy 11/19/16-12/02/16   brain 25 Gy in 10 fractions   Hyperlipidemia    Hypertension    Hypertension 08/07/2016  Osteoporosis    Small cell lung cancer (HCC) dx'd 05/2016    ALLERGIES:  is allergic to codeine, motrin [ibuprofen], thiazide-type diuretics, and vicodin [hydrocodone-acetaminophen].  MEDICATIONS:  Current Outpatient Medications  Medication Sig Dispense Refill   atorvastatin (LIPITOR) 80 MG tablet TAKE ONE TABLET BY MOUTH EVERY DAY 30 tablet 11   clopidogrel (PLAVIX) 75 MG tablet TAKE ONE TABLET BY MOUTH EVERY DAY 90 tablet 0   diazepam (VALIUM) 5 MG tablet Take 1 tablet (5 mg total) by mouth every 8  (eight) hours as needed for anxiety. 45 tablet 0   lidocaine-prilocaine (EMLA) cream Apply 1 Application topically as needed. 30 g 2   ondansetron (ZOFRAN) 8 MG tablet Take 1 tablet (8 mg total) by mouth every 8 (eight) hours as needed for nausea or vomiting. 20 tablet 0   oxyCODONE-acetaminophen (PERCOCET/ROXICET) 5-325 MG tablet Take 1 tablet by mouth every 6 (six) hours as needed for severe pain. 60 tablet 0   potassium chloride 20 MEQ/15ML (10%) SOLN Take 15 mLs (20 mEq total) by mouth 2 (two) times daily. 210 mL 0   traMADol (ULTRAM) 50 MG tablet Take 1 tablet (50 mg total) by mouth every 6 (six) hours as needed. 60 tablet 0   triamcinolone cream (KENALOG) 0.1 % Apply 1 Application topically 2 (two) times daily. 80 g 2   Vitamin D, Ergocalciferol, [name],000 Units by mouth 3 (three) times a week.     No current facility-administered medications for this visit.    SURGICAL HISTORY:  Past Surgical History:  Procedure Laterality Date   ABDOMINAL HYSTERECTOMY  1981   APPENDECTOMY     at age 33   EYE SURGERY     left   IR GENERIC HISTORICAL  06/03/2016   IR FLUORO GUIDE PORT INSERTION RIGHT 06/03/2016 [facility]   IR GENERIC HISTORICAL  06/03/2016   IR US GUIDE VASC ACCESS RIGHT 06/03/2016 [facility]   SKIN CANCER EXCISION     basal cell carcinoma L side of face    REVIEW OF SYSTEMS:   Constitutional: Positive for stable decreased appetite and fatigue. Negative for chills, fever and unexpected weight change.  HENT: Negative for mouth sores, nosebleeds, sore throat and trouble swallowing.  Eyes: Negative for eye problems and icterus.  Respiratory: Positive for stable SOB and mild cough which are her baseline. Negative for hemoptysis, and wheezing.   Cardiovascular: Negative for chest pain and leg swelling.  Gastrointestinal:  Negative for abdominal pain, diarrhea (averages once per week), constipation,  nausea and vomiting (only if  taking pain medication on empty stomach).  Genitourinary: Negative for bladder incontinence, difficulty urinating, dysuria, frequency and hematuria.   Musculoskeletal: Positive for chronic back pain.  Negative for gait problem, neck pain and neck stiffness.  Skin: Positive for occasional itching. Negative for rash.  Neurological: Positive for generalized weakness.  Positive for left-sided weakness due to her history of stroke.  Negative for extremity weakness, gait problem, [name], depression and sleep disturbance. The patient is not nervous/anxious   PHYSICAL EXAMINATION:  There were no vitals taken for this visit.  ECOG PERFORMANCE STATUS: 2-3  Physical Exam  Constitutional: Oriented to person, place, and time and chronically ill appearing female appears older than stated age and in no distress. HENT: Head: Normocephalic and atraumatic. Mouth/Throat: Oropharynx is clear and moist. No oropharyngeal exudate. Eyes: Conjunctivae are  normal. Right eye exhibits no discharge. Left eye exhibits no discharge. No scleral icterus. Neck: Normal range of motion. Neck supple. Cardiovascular: Normal rate, regular rhythm, normal heart sounds and intact distal pulses.   Pulmonary/Chest: Effort normal.  Quiet breath sounds in all lung fields.  No respiratory distress. No wheezes. No rales. Abdominal: Soft. Bowel sounds are normal. Exhibits no distension and no mass. There is no tenderness.  Musculoskeletal: She has limited range of motion in her left shoulder secondary to pain.  Hands are well-perfused and warm bilaterally.  Exhibits no edema.  Newness to palpation over the lateral aspect of the upper humerus. Lymphadenopathy:    slightly enlarged symmetric left and right upper cervical lymph node.  Neurological: Alert and oriented to person, place, and time.  Exhibits muscle wasting. Examined in the wheelchair. Chronic left sided weakness due to hx of CVA.  Skin: Skin is warm and dry. No rash noted. Not diaphoretic. No erythema. No pallor.  Psychiatric: Mood, memory and judgment normal. Vitals reviewed.  LABORATORY DATA: Lab Results  Component Value Date   WBC 6.1 04/16/2023   HGB 11.4 (L) 04/16/2023   HCT 33.0 (L) 04/16/2023   MCV 102.2 (H) 04/16/2023   PLT 232 04/16/2023      Chemistry      Component Value Date/Time   NA 139 04/16/2023 0953   NA 141 08/01/2017 0835   K 2.8 (L) 04/16/2023 0953   K 3.5 08/01/2017 0835   CL 102 04/16/2023 0953   CO2 31 04/16/2023 0953   CO2 25 08/01/2017 0835   BUN 5 (L) 04/16/2023 0953   BUN 8.0 08/01/2017 0835   CREATININE 0.66 04/16/2023 0953   CREATININE 0.9 08/01/2017 0835      Component Value Date/Time   CALCIUM 8.2 (L) 04/16/2023 0953   CALCIUM 9.9 08/01/2017 0835   ALKPHOS 126 04/16/2023 0953   ALKPHOS 142 08/01/2017 0835   AST 19 04/16/2023 0953   AST 15 08/01/2017 0835   ALT 14 04/16/2023 0953   ALT 19 08/01/2017 0835   BILITOT 0.5 04/16/2023 0953   BILITOT 0.35 08/01/2017 0835       RADIOGRAPHIC STUDIES:  No results found.   ASSESSMENT/PLAN:  This is a very pleasant 62 year old Caucasian female with extensive stage small cell lung cancer.  She presented with a right upper lobe lung mass, large right anterior mediastinal and supraclavicular lymphadenopathy as well as a pancreatic and splenic metastasis.  She was diagnosed in September 2017.    The patient underwent systemic chemotherapy with carboplatin and etoposide.  She is status post 6 cycles.  She tolerated treatment well except for chemotherapy-induced anemia which required  pRBCs and platelet transfusions.  She had a significant improvement of her disease with chemotherapy. The patient then underwent prophylactic cranial irradiation. She then underwent stereotactic radiotherapy to the right upper lobe and pulmonary  nodule.   She had been on observation for 2 years before showing evidence of disease progression.    She then was started on systemic chemotherapy with carboplatin, etoposide, and Tecentriq. Starting from cycle #5, she has been on maintenance single agent Tecentriq.  She has been tolerating treatment well. She is status post 79 cycles of Tecentriq total.    Labs were reviewed. Recommend that she proceed with cycle #80 today as schedule.     She is scheduled to see nutrition today as well.   She will continue to follow with palliative care periodically.   She will continue kenalog cream if needed.  I have reached out to the financial advocate and social work today to see if she qualifies to reapply for the [employer].  They are going to check on this and call the patient back if she is eligible to reapply.  I did also tell the patient that her prescriptions may be cheaper at the [facility].  However, the patient would like to continue to keep her prescriptions at her local pharmacy for now.  Will arrange for restaging CT scan at her next appointment.  The patient was advised to call immediately if she has any concerning symptoms in the interval. The patient voices understanding of current disease status and treatment options and is in agreement with the current care plan. All questions were answered. The patient knows to call the clinic with any problems, questions or concerns. We can certainly see the patient much sooner if necessary       No orders of the defined types were placed in this encounter.     The total time spent in the appointment was 20-29  Andreyah Natividad L Dorianne Perret, PA-C 05/02/23

## 2023-05-07 ENCOUNTER — Inpatient Hospital Stay: Payer: Medicare Other | Attending: Internal Medicine

## 2023-05-07 ENCOUNTER — Inpatient Hospital Stay (HOSPITAL_BASED_OUTPATIENT_CLINIC_OR_DEPARTMENT_OTHER): Payer: Medicare Other | Admitting: Physician Assistant

## 2023-05-07 ENCOUNTER — Inpatient Hospital Stay: Payer: Medicare Other

## 2023-05-07 ENCOUNTER — Inpatient Hospital Stay: Payer: Medicare Other | Admitting: Dietician

## 2023-05-07 VITALS — BP 107/69 | HR 79 | Resp 16

## 2023-05-07 VITALS — BP 95/68 | HR 91 | Temp 98.2°F | Resp 17 | Wt 98.6 lb

## 2023-05-07 DIAGNOSIS — T451X5A Adverse effect of antineoplastic and immunosuppressive drugs, initial encounter: Secondary | ICD-10-CM | POA: Diagnosis not present

## 2023-05-07 DIAGNOSIS — Z7902 Long term (current) use of antithrombotics/antiplatelets: Secondary | ICD-10-CM | POA: Diagnosis not present

## 2023-05-07 DIAGNOSIS — C778 Secondary and unspecified malignant neoplasm of lymph nodes of multiple regions: Secondary | ICD-10-CM | POA: Diagnosis not present

## 2023-05-07 DIAGNOSIS — Z79899 Other long term (current) drug therapy: Secondary | ICD-10-CM | POA: Insufficient documentation

## 2023-05-07 DIAGNOSIS — R4189 Other symptoms and signs involving cognitive functions and awareness: Secondary | ICD-10-CM | POA: Insufficient documentation

## 2023-05-07 DIAGNOSIS — I10 Essential (primary) hypertension: Secondary | ICD-10-CM | POA: Diagnosis not present

## 2023-05-07 DIAGNOSIS — C3411 Malignant neoplasm of upper lobe, right bronchus or lung: Secondary | ICD-10-CM | POA: Diagnosis not present

## 2023-05-07 DIAGNOSIS — R0609 Other forms of dyspnea: Secondary | ICD-10-CM | POA: Diagnosis not present

## 2023-05-07 DIAGNOSIS — R5383 Other fatigue: Secondary | ICD-10-CM

## 2023-05-07 DIAGNOSIS — G894 Chronic pain syndrome: Secondary | ICD-10-CM | POA: Diagnosis not present

## 2023-05-07 DIAGNOSIS — D6481 Anemia due to antineoplastic chemotherapy: Secondary | ICD-10-CM | POA: Diagnosis not present

## 2023-05-07 DIAGNOSIS — M810 Age-related osteoporosis without current pathological fracture: Secondary | ICD-10-CM | POA: Insufficient documentation

## 2023-05-07 DIAGNOSIS — Z923 Personal history of irradiation: Secondary | ICD-10-CM | POA: Diagnosis not present

## 2023-05-07 DIAGNOSIS — C3491 Malignant neoplasm of unspecified part of right bronchus or lung: Secondary | ICD-10-CM

## 2023-05-07 DIAGNOSIS — Z5112 Encounter for antineoplastic immunotherapy: Secondary | ICD-10-CM

## 2023-05-07 DIAGNOSIS — E785 Hyperlipidemia, unspecified: Secondary | ICD-10-CM | POA: Insufficient documentation

## 2023-05-07 DIAGNOSIS — F17210 Nicotine dependence, cigarettes, uncomplicated: Secondary | ICD-10-CM | POA: Insufficient documentation

## 2023-05-07 DIAGNOSIS — R197 Diarrhea, unspecified: Secondary | ICD-10-CM | POA: Insufficient documentation

## 2023-05-07 DIAGNOSIS — E876 Hypokalemia: Secondary | ICD-10-CM | POA: Insufficient documentation

## 2023-05-07 DIAGNOSIS — C7889 Secondary malignant neoplasm of other digestive organs: Secondary | ICD-10-CM | POA: Diagnosis not present

## 2023-05-07 DIAGNOSIS — Z85828 Personal history of other malignant neoplasm of skin: Secondary | ICD-10-CM | POA: Diagnosis not present

## 2023-05-07 LAB — CMP (CANCER CENTER ONLY)
ALT: 12 U/L (ref 0–44)
AST: 20 U/L (ref 15–41)
Albumin: 3.5 g/dL (ref 3.5–5.0)
Alkaline Phosphatase: 111 U/L (ref 38–126)
Anion gap: 5 (ref 5–15)
BUN: 6 mg/dL — ABNORMAL LOW (ref 8–23)
CO2: 31 mmol/L (ref 22–32)
Calcium: 8.4 mg/dL — ABNORMAL LOW (ref 8.9–10.3)
Chloride: 103 mmol/L (ref 98–111)
Creatinine: 0.84 mg/dL (ref 0.44–1.00)
GFR, Estimated: >60 mL/min (ref >60)
Glucose, Bld: 101 mg/dL — ABNORMAL HIGH (ref 70–99)
Potassium: 3.5 mmol/L (ref 3.5–5.1)
Sodium: 139 mmol/L (ref 135–145)
Total Bilirubin: 0.6 mg/dL (ref 0.3–1.2)
Total Protein: 6 g/dL — ABNORMAL LOW (ref 6.5–8.1)

## 2023-05-07 LAB — CBC WITH DIFFERENTIAL (CANCER CENTER ONLY)
Abs Immature Granulocytes: 0.01 10*3/uL (ref 0.00–0.07)
Basophils Absolute: 0 10*3/uL (ref 0.0–0.1)
Basophils Relative: 0 %
Eosinophils Absolute: 0.1 10*3/uL (ref 0.0–0.5)
Eosinophils Relative: 2 %
HCT: 32.7 % — ABNORMAL LOW (ref 36.0–46.0)
Hemoglobin: 11.2 g/dL — ABNORMAL LOW (ref 12.0–15.0)
Immature Granulocytes: 0 %
Lymphocytes Relative: 28 %
Lymphs Abs: 1.7 10*3/uL (ref 0.7–4.0)
MCH: 35.9 pg — ABNORMAL HIGH (ref 26.0–34.0)
MCHC: 34.3 g/dL (ref 30.0–36.0)
MCV: 104.8 fL — ABNORMAL HIGH (ref 80.0–100.0)
Monocytes Absolute: 0.3 10*3/uL (ref 0.1–1.0)
Monocytes Relative: 5 %
Neutro Abs: 3.9 10*3/uL (ref 1.7–7.7)
Neutrophils Relative %: 65 %
Platelet Count: 212 10*3/uL (ref 150–400)
RBC: 3.12 MIL/uL — ABNORMAL LOW (ref 3.87–5.11)
RDW: 14 % (ref 11.5–15.5)
WBC Count: 6 10*3/uL (ref 4.0–10.5)
nRBC: 0 % (ref 0.0–0.2)

## 2023-05-07 LAB — TSH: TSH: 2.269 u[IU]/mL (ref 0.350–4.500)

## 2023-05-07 MED ORDER — SODIUM CHLORIDE 0.9 % IV SOLN: Freq: Once | INTRAVENOUS | Status: AC

## 2023-05-07 MED ORDER — SODIUM CHLORIDE 0.9 % IV SOLN
1200.0000 mg | Freq: Once | INTRAVENOUS | Status: AC
  Administered 2023-05-07: 1200 mg via INTRAVENOUS
  Filled 2023-05-07: qty 20

## 2023-05-07 MED ORDER — HEPARIN SOD (PORK) LOCK FLUSH 100 UNIT/ML IV SOLN
500.0000 [IU] | Freq: Once | INTRAVENOUS | Status: AC | PRN
  Administered 2023-05-07: 500 [IU]

## 2023-05-07 MED ORDER — SODIUM CHLORIDE 0.9% FLUSH
10.0000 mL | INTRAVENOUS | Status: DC | PRN
  Administered 2023-05-07: 10 mL

## 2023-05-07 NOTE — Patient Instructions (Signed)
[facility]  Discharge Instructions: Thank you for choosing [facility] to provide your oncology and hematology care.   If you have a lab appointment with the [facility], please go directly to the [facility] and check in at the registration area.   Wear comfortable clothing and clothing appropriate for easy access to any Portacath or PICC line.   We strive to give you quality time with your provider. You may need to reschedule your appointment if you arrive late (15 or more minutes).  Arriving late affects you and other patients whose appointments are after yours.  Also, if you miss three or more appointments without notifying the office, you may be dismissed from the clinic at the provider's discretion.      For prescription refill requests, have your pharmacy contact our office and allow 72 hours for refills to be completed.    Today you received the following chemotherapy and/or immunotherapy agents: atezolizumab      To help prevent nausea and vomiting after your treatment, we encourage you to take your nausea medication as directed.  BELOW ARE SYMPTOMS THAT SHOULD BE REPORTED IMMEDIATELY: *FEVER GREATER THAN 100.4 F (38 C) OR HIGHER *CHILLS OR SWEATING *NAUSEA AND VOMITING THAT IS NOT CONTROLLED WITH YOUR NAUSEA MEDICATION *UNUSUAL SHORTNESS OF BREATH *UNUSUAL BRUISING OR BLEEDING *URINARY PROBLEMS (pain or burning when urinating, or frequent urination) *BOWEL PROBLEMS (unusual diarrhea, constipation, pain near the anus) TENDERNESS IN MOUTH AND THROAT WITH OR WITHOUT PRESENCE OF ULCERS (sore throat, sores in mouth, or a toothache) UNUSUAL RASH, SWELLING OR PAIN  UNUSUAL VAGINAL DISCHARGE OR ITCHING   Items with * indicate a potential emergency and should be followed up as soon as possible or go to the Emergency Department if any problems should occur.  Please show the CHEMOTHERAPY ALERT CARD or IMMUNOTHERAPY ALERT CARD at  check-in to the Emergency Department and triage nurse.  Should you have questions after your visit or need to cancel or reschedule your appointment, please contact [facility]  Dept: [phone]  and follow the prompts.  Office hours are 8:00 a.m. to 4:30 p.m. Monday - Friday. Please note that voicemails left after 4:00 p.m. may not be returned until the following business day.  We are closed weekends and major holidays. You have access to a nurse at all times for urgent questions. Please call the main number to the clinic Dept: [phone] and follow the prompts.   For any non-urgent questions, you may also contact your provider using MyChart. We now offer e-Visits for anyone 26 and older to request care online for non-urgent symptoms. For details visit [email or web address].   Also download the MyChart app! Go to the app store, search "MyChart", open the app, select [facility], and log in with your MyChart username and password.

## 2023-05-07 NOTE — Progress Notes (Signed)
Nutrition Follow-up:  Patient with extensive stage small cell lung cancer. She is currently receiving maintenance treatment with single agent Tecentriq 1200 mg IV every 3 weeks.    Met with pt and husband in infusion. Pt reports appetite has not been so good. Husband is supportive and encouraging of po intake. He is preparing meals. This morning she had eggs and couple slices of bacon. She had a cheeseburger for supper. Pt states she has been cold recently and not drinking as many pepsi due to this. Pt reports no diarrhea in the last couple weeks. Pt is taking liquid potassium. Husband mixes this with gatorade to mask taste. This works well. Pt hoping to feel well enough to travel to [place] this weekend.    Medications: reviewed   Labs: reviewed   Anthropometrics: Wt 98 lb 9.6 oz today - slight increase  8/14 - 97 lb 6.4 oz 7/24 - 98 lb 11.2 oz 7/3 - 100 lb 3.2 oz 6/11 - 99 lb 12.8 oz    INTERVENTION:  Encouraged high calorie high protein foods for wt maintenance/gain Suggested warm beverage ideas (hot apple cider, hot chocolate) One bag of food from [employer]     MONITORING, EVALUATION, GOAL: wt trends, intake   NEXT VISIT: Wednesday October 16 during infusion

## 2023-05-13 ENCOUNTER — Encounter: Payer: Self-pay | Admitting: Internal Medicine

## 2023-05-13 NOTE — Progress Notes (Signed)
Rcvd a staff msg from Lauren that pt would need to reapply for the [employer] so I called her to get updated information.  She will bring proof of income on 05/28/23.  If she qualifies I will approve her for the grant.

## 2023-05-26 NOTE — Progress Notes (Unsigned)
Palliative Medicine [facility]  [phone] [phone]   Name: Debra Kidd Date: 05/26/2023 MRN: [id number]  DOB: 06/23/1961  Patient Care Team: Pcp, No as PCP - General    INTERVAL HISTORY: Debra Kidd is a 62 y.o. 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  Palliative ask to see for symptom management and goals of care.   SOCIAL HISTORY:     reports that she has been smoking cigarettes. She has a 86 pack-year smoking history. She has never used smokeless tobacco. She reports that she does not currently use alcohol. She reports current drug use. Frequency: 7.00 times per week. Drug: Marijuana.  ADVANCE DIRECTIVES:    CODE STATUS:   PAST MEDICAL HISTORY: Past Medical History:  Diagnosis Date   Basal cell carcinoma of cheek    L side of face   Blood type, Rh positive    Cancer (HCC)    Chronic pain syndrome    Depression 08/07/2016   Encounter for antineoplastic chemotherapy 07/17/2016   History of external beam radiation therapy 11/19/16-12/02/16   brain 25 Gy in 10 fractions   Hyperlipidemia    Hypertension    Hypertension 08/07/2016   Osteoporosis    Small cell lung cancer (HCC) dx'd 05/2016    ALLERGIES:  is allergic to codeine, motrin [ibuprofen], thiazide-type diuretics, and vicodin [hydrocodone-acetaminophen].  MEDICATIONS:  Current Outpatient Medications  Medication Sig Dispense Refill   atorvastatin (LIPITOR) 80 MG tablet TAKE ONE TABLET BY MOUTH EVERY DAY 30 tablet 11   clopidogrel (PLAVIX) 75 MG tablet TAKE ONE TABLET BY MOUTH EVERY DAY 90 tablet 0   diazepam (VALIUM) 5 MG tablet Take 1 tablet (5 mg total) by mouth every 8 (eight) hours as needed for anxiety. 45 tablet 0   lidocaine-prilocaine (EMLA) cream Apply 1 Application  topically as needed. 30 g 2   ondansetron (ZOFRAN) 8 MG tablet Take 1 tablet (8 mg total) by mouth every 8 (eight) hours as needed for nausea or vomiting. 20 tablet 0   oxyCODONE-acetaminophen (PERCOCET/ROXICET) 5-325 MG tablet Take 1 tablet by mouth every 6 (six) hours as needed for severe pain. 60 tablet 0   potassium chloride 20 MEQ/15ML (10%) SOLN Take 15 mLs (20 mEq total) by mouth 2 (two) times daily. 210 mL 0   traMADol (ULTRAM) 50 MG tablet Take 1 tablet (50 mg total) by mouth every 6 (six) hours as needed. 60 tablet 0   triamcinolone cream (KENALOG) 0.1 % Apply 1 Application topically 2 (two) times daily. 80 g 2   Vitamin D, Ergocalciferol, [name],000 Units by mouth 3 (three) times a week.     No current facility-administered medications for this visit.    VITAL SIGNS: There were no vitals taken for this visit. There were no vitals filed for this visit.  Estimated body mass index is 17.47 kg/m as calculated from the following:   Height as of 03/26/23: 5\' 3"  (1.6 m).   Weight as of 05/07/23: 98 lb 9.6 oz (44.7 kg).   PERFORMANCE STATUS (ECOG) : 1 - Symptomatic but completely ambulatory  Assessment NAD, sitting in recliner Normal breathing pattern RRR AAO x4  IMPRESSION: Debra Kidd presents to clinic for follow-up. No acute distress noted. Husband is present. Denies nausea, vomiting, constipation,  or diarrhea. Is doing well overall. Trying to remain as active as possible.   Chronic pain Debra Kidd endorses pain being well controlled.  Some days are better than others.  Associates increased pain with increased activity.    We reviewed patient's current regimen.  She is taking Tramadol for moderate pain. Takes at least one-two tablets daily.  Will continue to closely monitor and adjust medications as needed.   Constipation Controlled with daily regimen.    Anxiety/Insomnia Controlled. Taking Valium as needed.  States she will  sometimes wake up early in the morning and will have to calm herself down from shaking.   I discussed the importance of continued conversation with family and their medical providers regarding overall plan of care and treatment options, ensuring decisions are within the context of the patients values and GOCs.  PLAN:   Tramadol 50mg  every 8 hours as needed for moderate insurance.  Valium 5 mg 1 tablet every 8 hours as needed for anxiety/insomnia (does not require daily) Pain contract completed. Senna S daily for bowel regimen I will plan to see patient back in 3-4 weeks in collaboration to other oncology appointments   Patient expressed understanding and was in agreement with this plan. She also understands that She can call the clinic at any time with any questions, concerns, or complaints.   Any controlled substances utilized were prescribed in the context of palliative care. PDMP has been reviewed.    Visit consisted of counseling and education dealing with the complex and emotionally intense issues of symptom management and palliative care in the setting of serious and potentially life-threatening illness.Greater than 50%  of this time was spent counseling and coordinating care related to the above assessment and plan.  [name], AGPCNP-BC  Palliative Medicine Team/[facility]  *Please note that this is a verbal dictation therefore any spelling or grammatical errors are due to the "[employer] One" system interpretation.

## 2023-05-28 ENCOUNTER — Encounter: Payer: Self-pay | Admitting: Nurse Practitioner

## 2023-05-28 ENCOUNTER — Inpatient Hospital Stay: Payer: Medicare Other

## 2023-05-28 ENCOUNTER — Encounter: Payer: Self-pay | Admitting: Internal Medicine

## 2023-05-28 ENCOUNTER — Inpatient Hospital Stay (HOSPITAL_BASED_OUTPATIENT_CLINIC_OR_DEPARTMENT_OTHER): Payer: Medicare Other | Admitting: Internal Medicine

## 2023-05-28 ENCOUNTER — Encounter: Payer: Self-pay | Admitting: Medical Oncology

## 2023-05-28 ENCOUNTER — Inpatient Hospital Stay (HOSPITAL_BASED_OUTPATIENT_CLINIC_OR_DEPARTMENT_OTHER): Payer: Medicare Other | Admitting: Nurse Practitioner

## 2023-05-28 VITALS — BP 108/79 | HR 81 | Temp 98.1°F | Resp 15 | Ht 63.0 in | Wt 98.5 lb

## 2023-05-28 DIAGNOSIS — F419 Anxiety disorder, unspecified: Secondary | ICD-10-CM

## 2023-05-28 DIAGNOSIS — Z5112 Encounter for antineoplastic immunotherapy: Secondary | ICD-10-CM | POA: Diagnosis not present

## 2023-05-28 DIAGNOSIS — R5383 Other fatigue: Secondary | ICD-10-CM

## 2023-05-28 DIAGNOSIS — C3491 Malignant neoplasm of unspecified part of right bronchus or lung: Secondary | ICD-10-CM

## 2023-05-28 DIAGNOSIS — G893 Neoplasm related pain (acute) (chronic): Secondary | ICD-10-CM | POA: Diagnosis not present

## 2023-05-28 DIAGNOSIS — Z515 Encounter for palliative care: Secondary | ICD-10-CM

## 2023-05-28 LAB — CBC WITH DIFFERENTIAL (CANCER CENTER ONLY)
Abs Immature Granulocytes: 0.02 10*3/uL (ref 0.00–0.07)
Basophils Absolute: 0 10*3/uL (ref 0.0–0.1)
Basophils Relative: 0 %
Eosinophils Absolute: 0.2 10*3/uL (ref 0.0–0.5)
Eosinophils Relative: 3 %
HCT: 32.9 % — ABNORMAL LOW (ref 36.0–46.0)
Hemoglobin: 11.1 g/dL — ABNORMAL LOW (ref 12.0–15.0)
Immature Granulocytes: 0 %
Lymphocytes Relative: 29 %
Lymphs Abs: 2.5 10*3/uL (ref 0.7–4.0)
MCH: 35.9 pg — ABNORMAL HIGH (ref 26.0–34.0)
MCHC: 33.7 g/dL (ref 30.0–36.0)
MCV: 106.5 fL — ABNORMAL HIGH (ref 80.0–100.0)
Monocytes Absolute: 0.5 10*3/uL (ref 0.1–1.0)
Monocytes Relative: 6 %
Neutro Abs: 5.3 10*3/uL (ref 1.7–7.7)
Neutrophils Relative %: 62 %
Platelet Count: 231 10*3/uL (ref 150–400)
RBC: 3.09 MIL/uL — ABNORMAL LOW (ref 3.87–5.11)
RDW: 13.9 % (ref 11.5–15.5)
WBC Count: 8.7 10*3/uL (ref 4.0–10.5)
nRBC: 0 % (ref 0.0–0.2)

## 2023-05-28 LAB — CMP (CANCER CENTER ONLY)
ALT: 12 U/L (ref 0–44)
AST: 20 U/L (ref 15–41)
Albumin: 3.4 g/dL — ABNORMAL LOW (ref 3.5–5.0)
Alkaline Phosphatase: 121 U/L (ref 38–126)
Anion gap: 8 (ref 5–15)
BUN: 5 mg/dL — ABNORMAL LOW (ref 8–23)
CO2: 26 mmol/L (ref 22–32)
Calcium: 8.1 mg/dL — ABNORMAL LOW (ref 8.9–10.3)
Chloride: 105 mmol/L (ref 98–111)
Creatinine: 0.72 mg/dL (ref 0.44–1.00)
GFR, Estimated: >60 mL/min (ref >60)
Glucose, Bld: 113 mg/dL — ABNORMAL HIGH (ref 70–99)
Potassium: 3.1 mmol/L — ABNORMAL LOW (ref 3.5–5.1)
Sodium: 139 mmol/L (ref 135–145)
Total Bilirubin: 0.5 mg/dL (ref 0.3–1.2)
Total Protein: 5.9 g/dL — ABNORMAL LOW (ref 6.5–8.1)

## 2023-05-28 LAB — TSH: TSH: 3.324 u[IU]/mL (ref 0.350–4.500)

## 2023-05-28 MED ORDER — DIAZEPAM 5 MG PO TABS: 5.0000 mg | ORAL_TABLET | Freq: Three times a day (TID) | ORAL | 0 refills | Status: DC | PRN

## 2023-05-28 MED ORDER — TRAMADOL HCL 50 MG PO TABS: 50.0000 mg | ORAL_TABLET | Freq: Four times a day (QID) | ORAL | 0 refills | Status: DC | PRN

## 2023-05-28 MED ORDER — HEPARIN SOD (PORK) LOCK FLUSH 100 UNIT/ML IV SOLN
500.0000 [IU] | Freq: Once | INTRAVENOUS | Status: AC | PRN
  Administered 2023-05-28: 500 [IU]

## 2023-05-28 MED ORDER — SODIUM CHLORIDE 0.9% FLUSH
10.0000 mL | INTRAVENOUS | Status: DC | PRN
  Administered 2023-05-28: 10 mL

## 2023-05-28 MED ORDER — SODIUM CHLORIDE 0.9 % IV SOLN: Freq: Once | INTRAVENOUS | Status: AC

## 2023-05-28 MED ORDER — SODIUM CHLORIDE 0.9 % IV SOLN
1200.0000 mg | Freq: Once | INTRAVENOUS | Status: AC
  Administered 2023-05-28: 1200 mg via INTRAVENOUS
  Filled 2023-05-28: qty 20

## 2023-05-28 NOTE — Progress Notes (Signed)
Debra Kidd [phone]   [phone]  OFFICE PROGRESS NOTE  Pcp, [name], N3, [name], large right anterior mediastinal and supraclavicular lymphadenopathy as well as pancreatic and splenic metastasis diagnosed in September 2017.  The patient had disease progression in October 2019.  PRIOR THERAPY:  1) Systemic chemotherapy was carboplatin for AUC of 5 on day 1 and etoposide 100 MG/M2 on days 1, 2 and 3 with Neulasta support. Status post 6 cycles with significant response of her disease. 2) Prophylactic cranial irradiation under the care of Dr. Roselind Messier on 12/02/2016. 3) stereotactic radiotherapy to the recurrent right upper lobe pulmonary nodule under the care of Dr. Roselind Messier completed November 10, 2017. 4) Retreatment with systemic chemotherapy with carboplatin for AUC of 5 on day 1 and etoposide 100 mg/M2 on days 1, 2 and 3 as well as Tecentriq (Atezolizumab) 1200 mg IV every 3 weeks with Neulasta support.  First dose June 22, 2018 for disease recurrence.  Status post 5 cycles.  Starting from cycle #2 her dose of carboplatin will be reduced to AUC of 4 and etoposide 80 mg/M2 on days 1, 2 and 3 in addition to the regular dose of Tecentriq.  CURRENT THERAPY: Maintenance treatment with single agent Tecentriq 1200 mg IV every 3 weeks.  Status post 75 cycles.  INTERVAL HISTORY: Debra Kidd 62 y.o. female returns to the clinic today for follow-up visit accompanied by her husband.Discussed the use of AI scribe software for clinical note transcription with the patient, who gave verbal consent to proceed.  History of Present Illness   Debra Kidd, a 62 year old patient with a history of small cell lung cancer, presents with recent onset short-term memory loss over the past few months. The patient reports being able to recall long-term memories but  struggles with recent events. She also reports increased fatigue and a disrupted sleep pattern, staying up at night and sleeping during the day. This pattern is exacerbated during the fall season, a recurring issue for the past twenty years.  The patient has a history of brain radiation for small cell lung cancer, which could potentially contribute to the memory issues. She also reports occasional diarrhea, usually once or twice a day, but denies any fever or chills. The patient is currently on a regimen of vitamin D and is undergoing maintenance treatment for her lung cancer.  The patient also requests a refill of diazepam, which is managed by her family doctor.       MEDICAL HISTORY: Past Medical History:  Diagnosis Date   Basal cell carcinoma of cheek    L side of face   Blood type, Rh positive    Cancer (HCC)    Chronic pain syndrome    Depression 08/07/2016   Encounter for antineoplastic chemotherapy 07/17/2016   History of external beam radiation therapy 11/19/16-12/02/16   brain 25 Gy in 10 fractions   Hyperlipidemia    Hypertension    Hypertension 08/07/2016   Osteoporosis    Small cell lung cancer (HCC) dx'd 05/2016    ALLERGIES:  is allergic to codeine, motrin [ibuprofen], thiazide-type diuretics, and vicodin [hydrocodone-acetaminophen].  MEDICATIONS:  Current Outpatient Medications  Medication Sig Dispense Refill   atorvastatin (LIPITOR) 80 MG tablet TAKE ONE TABLET BY MOUTH EVERY DAY 30 tablet 11   clopidogrel (PLAVIX) 75 MG tablet TAKE ONE TABLET BY MOUTH EVERY  DAY 90 tablet 0   diazepam (VALIUM) 5 MG tablet Take 1 tablet (5 mg total) by mouth every 8 (eight) hours as needed for anxiety. 45 tablet 0   lidocaine-prilocaine (EMLA) cream Apply 1 Application topically as needed. 30 g 2   ondansetron (ZOFRAN) 8 MG tablet Take 1 tablet (8 mg total) by mouth every 8 (eight) hours as needed for nausea or vomiting. 20 tablet 0   oxyCODONE-acetaminophen (PERCOCET/ROXICET) 5-325 MG  tablet Take 1 tablet by mouth every 6 (six) hours as needed for severe pain. 60 tablet 0   potassium chloride 20 MEQ/15ML (10%) SOLN Take 15 mLs (20 mEq total) by mouth 2 (two) times daily. 210 mL 0   traMADol (ULTRAM) 50 MG tablet Take 1 tablet (50 mg total) by mouth every 6 (six) hours as needed. 60 tablet 0   triamcinolone cream (KENALOG) 0.1 % Apply 1 Application topically 2 (two) times daily. 80 g 2   Vitamin D, Ergocalciferol, [name],000 Units by mouth 3 (three) times a week.     No current facility-administered medications for this visit.    SURGICAL HISTORY:  Past Surgical History:  Procedure Laterality Date   ABDOMINAL HYSTERECTOMY  1981   APPENDECTOMY     at age 41   EYE SURGERY     left   IR GENERIC HISTORICAL  06/03/2016   IR FLUORO GUIDE PORT INSERTION RIGHT 06/03/2016 [facility]   IR GENERIC HISTORICAL  06/03/2016   IR US GUIDE VASC ACCESS RIGHT 06/03/2016 [facility]   SKIN CANCER EXCISION     basal cell carcinoma L side of face    REVIEW OF SYSTEMS:  A comprehensive review of systems was negative except for: Constitutional: positive for fatigue Respiratory: positive for dyspnea on exertion Musculoskeletal: positive for arthralgias and muscle weakness Neurological: positive for memory problems   PHYSICAL EXAMINATION: General appearance: alert, cooperative, fatigued, and no distress Head: Normocephalic, without obvious abnormality, atraumatic Neck: no adenopathy, no JVD, supple, symmetrical, trachea midline, and thyroid not enlarged, symmetric, no tenderness/mass/nodules Lymph nodes: Cervical, supraclavicular, and axillary nodes normal. Resp: clear to auscultation bilaterally Back: symmetric, no curvature. ROM normal. No CVA tenderness. Cardio: regular rate and rhythm, S1, S2 normal, no murmur, click, rub or gallop GI: soft, non-tender; bowel sounds normal; no masses,  no organomegaly Extremities: extremities normal,  atraumatic, no cyanosis or edema  ECOG PERFORMANCE STATUS: 1 - Symptomatic but completely ambulatory  Blood pressure 108/79, pulse 81, temperature 98.1 F (36.7 C), temperature source Oral, resp. rate 15, height 5\' 3"  (1.6 m), weight 98 lb 8 oz (44.7 kg), SpO2 100%.  LABORATORY DATA: Lab Results  Component Value Date   WBC 6.0 05/07/2023   HGB 11.2 (L) 05/07/2023   HCT 32.7 (L) 05/07/2023   MCV 104.8 (H) 05/07/2023   PLT 212 05/07/2023      Chemistry      Component Value Date/Time   NA 139 05/07/2023 1352   NA 141 08/01/2017 0835   K 3.5 05/07/2023 1352   K 3.5 08/01/2017 0835   CL 103 05/07/2023 1352   CO2 31 05/07/2023 1352   CO2 25 08/01/2017 0835   BUN 6 (L) 05/07/2023 1352   BUN 8.0 08/01/2017 0835   CREATININE 0.84 05/07/2023 1352   CREATININE 0.9 08/01/2017 0835      Component Value Date/Time   CALCIUM 8.4 (L) 05/07/2023 1352   CALCIUM 9.9 08/01/2017 0835   ALKPHOS 111 05/07/2023 1352  ALKPHOS 142 08/01/2017 0835   AST 20 05/07/2023 1352   AST 15 08/01/2017 0835   ALT 12 05/07/2023 1352   ALT 19 08/01/2017 0835   BILITOT 0.6 05/07/2023 1352   BILITOT 0.35 08/01/2017 0835       RADIOGRAPHIC STUDIES: No results found.  ASSESSMENT AND PLAN:  This is a very pleasant 62 years old white female with extensive stage small cell lung cancer status post systemic chemotherapy with carbo platinum and etoposide for 6 cycles and the patient rotated her treatment well except for chemotherapy-induced anemia and requirement for PRBCs and platelet transfusion. She had significant improvement in her disease with the chemotherapy. She also had prophylactic cranial irradiation. She also underwent stereotactic radiotherapy to progressive right upper lobe pulmonary nodule. The patient has been in observation for close to 2 years. Repeat CT scan of the chest, abdomen and pelvis performed at that time showed evidence for disease progression in the abdomen. The patient started on  systemic chemotherapy again with carboplatin, etoposide and Tecentriq status post 80 cycles.  Starting from cycle #5 the patient is treated with maintenance single agent Tecentriq. Assessment and Plan    Cognitive Impairment Recent onset of short-term memory loss. Possible contributing factors include poor sleep hygiene and history of brain radiation for small cell lung cancer. No acute changes in condition or recent infections. -Encourage improved sleep hygiene, including limiting nighttime computer use and maintaining a regular sleep schedule.  Small Cell Lung Cancer Long-term survivor on maintenance treatment with Dicentric. No new symptoms of disease progression. -Continue Tecentriq cycle 81 today as maintenance therapy. -Next treatment cycle in three weeks.  Diarrhea Reports of daily to twice daily loose stools. No current treatment. -Monitor symptoms. Consider anti-diarrheal medication if frequency increases.  General Health Maintenance -Continue Vitamin D as prescribed. -Ensure primary care physician is managing diazepam prescription.    For the anxiety and pain management, she was referred to the palliative care team and she has an appointment with them today. For the history of a stroke she is currently on treatment with Plavix and aspirin and she is followed by neurology. The patient was advised to call immediately if she has any other concerning symptoms in the interval. All questions were answered. The patient knows to call the clinic with any problems, questions or concerns. We can certainly see the patient much sooner if necessary.  Disclaimer: This note was dictated with voice recognition software. Similar sounding words can inadvertently be transcribed and may not be corrected upon review.

## 2023-05-28 NOTE — Patient Instructions (Signed)
[facility]  Discharge Instructions: Thank you for choosing [facility] to provide your oncology and hematology care.   If you have a lab appointment with the Cancer Center, please go directly to the Cancer Center and check in at the registration area.   Wear comfortable clothing and clothing appropriate for easy access to any Portacath or PICC line.   We strive to give you quality time with your provider. You may need to reschedule your appointment if you arrive late (15 or more minutes).  Arriving late affects you and other patients whose appointments are after yours.  Also, if you miss three or more appointments without notifying the office, you may be dismissed from the clinic at the provider's discretion.      For prescription refill requests, have your pharmacy contact our office and allow 72 hours for refills to be completed.    Today you received the following chemotherapy and/or immunotherapy agents Tecentriq      To help prevent nausea and vomiting after your treatment, we encourage you to take your nausea medication as directed.  BELOW ARE SYMPTOMS THAT SHOULD BE REPORTED IMMEDIATELY: *FEVER GREATER THAN 100.4 F (38 C) OR HIGHER *CHILLS OR SWEATING *NAUSEA AND VOMITING THAT IS NOT CONTROLLED WITH YOUR NAUSEA MEDICATION *UNUSUAL SHORTNESS OF BREATH *UNUSUAL BRUISING OR BLEEDING *URINARY PROBLEMS (pain or burning when urinating, or frequent urination) *BOWEL PROBLEMS (unusual diarrhea, constipation, pain near the anus) TENDERNESS IN MOUTH AND THROAT WITH OR WITHOUT PRESENCE OF ULCERS (sore throat, sores in mouth, or a toothache) UNUSUAL RASH, SWELLING OR PAIN  UNUSUAL VAGINAL DISCHARGE OR ITCHING   Items with * indicate a potential emergency and should be followed up as soon as possible or go to the Emergency Department if any problems should occur.  Please show the CHEMOTHERAPY ALERT CARD or IMMUNOTHERAPY ALERT CARD at  check-in to the Emergency Department and triage nurse.  Should you have questions after your visit or need to cancel or reschedule your appointment, please contact [facility]  Dept: [phone]  and follow the prompts.  Office hours are 8:00 a.m. to 4:30 p.m. Monday - Friday. Please note that voicemails left after 4:00 p.m. may not be returned until the following business day.  We are closed weekends and major holidays. You have access to a nurse at all times for urgent questions. Please call the main number to the clinic Dept: [phone] and follow the prompts.   For any non-urgent questions, you may also contact your provider using MyChart. We now offer e-Visits for anyone 80 and older to request care online for non-urgent symptoms. For details visit [email or web address].   Also download the MyChart app! Go to the app store, search "MyChart", open the app, select , and log in with your MyChart username and password.

## 2023-05-28 NOTE — Progress Notes (Signed)
Pt provided her proof of income to apply for the [employer] but she exceeds the limit so unfortunately she doesn't qualify for the grant at this time.

## 2023-06-05 ENCOUNTER — Other Ambulatory Visit: Payer: Self-pay | Admitting: Physician Assistant

## 2023-06-18 ENCOUNTER — Inpatient Hospital Stay: Payer: Medicare Other

## 2023-06-18 ENCOUNTER — Inpatient Hospital Stay: Payer: Medicare Other | Attending: Internal Medicine | Admitting: Internal Medicine

## 2023-06-18 ENCOUNTER — Inpatient Hospital Stay: Payer: Medicare Other | Admitting: Dietician

## 2023-06-18 ENCOUNTER — Inpatient Hospital Stay: Payer: Medicare Other | Attending: Internal Medicine

## 2023-06-18 ENCOUNTER — Encounter: Payer: Self-pay | Admitting: Internal Medicine

## 2023-06-18 VITALS — BP 102/76 | HR 91 | Temp 98.7°F | Resp 16 | Ht 63.0 in | Wt 97.8 lb

## 2023-06-18 DIAGNOSIS — R0602 Shortness of breath: Secondary | ICD-10-CM | POA: Insufficient documentation

## 2023-06-18 DIAGNOSIS — Z5112 Encounter for antineoplastic immunotherapy: Secondary | ICD-10-CM | POA: Diagnosis present

## 2023-06-18 DIAGNOSIS — G894 Chronic pain syndrome: Secondary | ICD-10-CM | POA: Insufficient documentation

## 2023-06-18 DIAGNOSIS — E785 Hyperlipidemia, unspecified: Secondary | ICD-10-CM | POA: Insufficient documentation

## 2023-06-18 DIAGNOSIS — I10 Essential (primary) hypertension: Secondary | ICD-10-CM | POA: Insufficient documentation

## 2023-06-18 DIAGNOSIS — C7889 Secondary malignant neoplasm of other digestive organs: Secondary | ICD-10-CM | POA: Insufficient documentation

## 2023-06-18 DIAGNOSIS — Z85828 Personal history of other malignant neoplasm of skin: Secondary | ICD-10-CM | POA: Diagnosis not present

## 2023-06-18 DIAGNOSIS — M810 Age-related osteoporosis without current pathological fracture: Secondary | ICD-10-CM | POA: Insufficient documentation

## 2023-06-18 DIAGNOSIS — Z8673 Personal history of transient ischemic attack (TIA), and cerebral infarction without residual deficits: Secondary | ICD-10-CM | POA: Diagnosis not present

## 2023-06-18 DIAGNOSIS — Z7902 Long term (current) use of antithrombotics/antiplatelets: Secondary | ICD-10-CM | POA: Insufficient documentation

## 2023-06-18 DIAGNOSIS — C3411 Malignant neoplasm of upper lobe, right bronchus or lung: Secondary | ICD-10-CM | POA: Diagnosis not present

## 2023-06-18 DIAGNOSIS — R112 Nausea with vomiting, unspecified: Secondary | ICD-10-CM | POA: Diagnosis not present

## 2023-06-18 DIAGNOSIS — Z923 Personal history of irradiation: Secondary | ICD-10-CM | POA: Insufficient documentation

## 2023-06-18 DIAGNOSIS — Z79899 Other long term (current) drug therapy: Secondary | ICD-10-CM | POA: Insufficient documentation

## 2023-06-18 DIAGNOSIS — Z9221 Personal history of antineoplastic chemotherapy: Secondary | ICD-10-CM | POA: Insufficient documentation

## 2023-06-18 DIAGNOSIS — R5383 Other fatigue: Secondary | ICD-10-CM

## 2023-06-18 DIAGNOSIS — C3490 Malignant neoplasm of unspecified part of unspecified bronchus or lung: Secondary | ICD-10-CM | POA: Diagnosis not present

## 2023-06-18 DIAGNOSIS — C3491 Malignant neoplasm of unspecified part of right bronchus or lung: Secondary | ICD-10-CM

## 2023-06-18 LAB — CBC WITH DIFFERENTIAL (CANCER CENTER ONLY)
Abs Immature Granulocytes: 0.01 10*3/uL (ref 0.00–0.07)
Basophils Absolute: 0 10*3/uL (ref 0.0–0.1)
Basophils Relative: 0 %
Eosinophils Absolute: 0.2 10*3/uL (ref 0.0–0.5)
Eosinophils Relative: 3 %
HCT: 34.2 % — ABNORMAL LOW (ref 36.0–46.0)
Hemoglobin: 11.5 g/dL — ABNORMAL LOW (ref 12.0–15.0)
Immature Granulocytes: 0 %
Lymphocytes Relative: 35 %
Lymphs Abs: 2.2 10*3/uL (ref 0.7–4.0)
MCH: 36.2 pg — ABNORMAL HIGH (ref 26.0–34.0)
MCHC: 33.6 g/dL (ref 30.0–36.0)
MCV: 107.5 fL — ABNORMAL HIGH (ref 80.0–100.0)
Monocytes Absolute: 0.4 10*3/uL (ref 0.1–1.0)
Monocytes Relative: 6 %
Neutro Abs: 3.6 10*3/uL (ref 1.7–7.7)
Neutrophils Relative %: 56 %
Platelet Count: 211 10*3/uL (ref 150–400)
RBC: 3.18 MIL/uL — ABNORMAL LOW (ref 3.87–5.11)
RDW: 14 % (ref 11.5–15.5)
WBC Count: 6.4 10*3/uL (ref 4.0–10.5)
nRBC: 0 % (ref 0.0–0.2)

## 2023-06-18 LAB — CMP (CANCER CENTER ONLY)
ALT: 15 U/L (ref 0–44)
AST: 28 U/L (ref 15–41)
Albumin: 3.2 g/dL — ABNORMAL LOW (ref 3.5–5.0)
Alkaline Phosphatase: 119 U/L (ref 38–126)
Anion gap: 10 (ref 5–15)
BUN: 5 mg/dL — ABNORMAL LOW (ref 8–23)
CO2: 26 mmol/L (ref 22–32)
Calcium: 8.4 mg/dL — ABNORMAL LOW (ref 8.9–10.3)
Chloride: 102 mmol/L (ref 98–111)
Creatinine: 0.75 mg/dL (ref 0.44–1.00)
GFR, Estimated: >60 mL/min
Glucose, Bld: 120 mg/dL — ABNORMAL HIGH (ref 70–99)
Potassium: 3.1 mmol/L — ABNORMAL LOW (ref 3.5–5.1)
Sodium: 138 mmol/L (ref 135–145)
Total Bilirubin: 0.5 mg/dL (ref 0.3–1.2)
Total Protein: 5.9 g/dL — ABNORMAL LOW (ref 6.5–8.1)

## 2023-06-18 LAB — TSH: TSH: 1.978 u[IU]/mL (ref 0.350–4.500)

## 2023-06-18 MED ORDER — SODIUM CHLORIDE 0.9% FLUSH
10.0000 mL | INTRAVENOUS | Status: DC | PRN
  Administered 2023-06-18: 10 mL

## 2023-06-18 MED ORDER — SODIUM CHLORIDE 0.9 % IV SOLN
1200.0000 mg | Freq: Once | INTRAVENOUS | Status: AC
  Administered 2023-06-18: 1200 mg via INTRAVENOUS
  Filled 2023-06-18: qty 20

## 2023-06-18 MED ORDER — HEPARIN SOD (PORK) LOCK FLUSH 100 UNIT/ML IV SOLN
500.0000 [IU] | Freq: Once | INTRAVENOUS | Status: AC | PRN
  Administered 2023-06-18: 500 [IU]

## 2023-06-18 MED ORDER — SODIUM CHLORIDE 0.9 % IV SOLN: Freq: Once | INTRAVENOUS | Status: AC

## 2023-06-18 NOTE — Progress Notes (Signed)
Debra Kidd [phone]   Fax:(336) [phone]  OFFICE PROGRESS NOTE  Pcp, [name], N3, [name], large right anterior mediastinal and supraclavicular lymphadenopathy as well as pancreatic and splenic metastasis diagnosed in September 2017.  The patient had disease progression in October 2019.  PRIOR THERAPY:  1) Systemic chemotherapy was carboplatin for AUC of 5 on day 1 and etoposide 100 MG/M2 on days 1, 2 and 3 with Neulasta support. Status post 6 cycles with significant response of her disease. 2) Prophylactic cranial irradiation under the care of Dr. Roselind Messier on 12/02/2016. 3) stereotactic radiotherapy to the recurrent right upper lobe pulmonary nodule under the care of Dr. Roselind Messier completed November 10, 2017. 4) Retreatment with systemic chemotherapy with carboplatin for AUC of 5 on day 1 and etoposide 100 mg/M2 on days 1, 2 and 3 as well as Tecentriq (Atezolizumab) 1200 mg IV every 3 weeks with Neulasta support.  First dose June 22, 2018 for disease recurrence.  Status post 5 cycles.  Starting from cycle #2 her dose of carboplatin will be reduced to AUC of 4 and etoposide 80 mg/M2 on days 1, 2 and 3 in addition to the regular dose of Tecentriq.  CURRENT THERAPY: Maintenance treatment with single agent Tecentriq 1200 mg IV every 3 weeks.  Status post 76 cycles.  INTERVAL HISTORY: Debra Kidd 62 y.o. female returns to the clinic today for follow-up visit accompanied by her husband.Discussed the use of AI scribe software for clinical note transcription with the patient, who gave verbal consent to proceed.  History of Present Illness   The patient, a 62 year old with a history of extensive stage small cell lung cancer diagnosed in 2017, has been on immunotherapy with Atezolizumab for approximately 75 cycles. Recently, he has been experiencing weight loss,  with a decrease from 99 to 97.8 pounds over the past three weeks. This weight loss is attributed to a decrease in appetite and reduced meal intake, although occasional snacking is reported.  The patient also reports an episode of nausea and vomiting, which he associates with a change in his tramadol medication. The symptoms resolved upon discontinuation of the new tramadol. No diarrhea is reported.  In addition to these symptoms, the patient experiences intermittent shortness of breath. Despite these issues, he reports tolerating the immunotherapy well overall. The patient's last scan was in July, and a follow-up scan is planned for the near future.        MEDICAL HISTORY: Past Medical History:  Diagnosis Date   Basal cell carcinoma of cheek    L side of face   Blood type, Rh positive    Cancer (HCC)    Chronic pain syndrome    Depression 08/07/2016   Encounter for antineoplastic chemotherapy 07/17/2016   History of external beam radiation therapy 11/19/16-12/02/16   brain 25 Gy in 10 fractions   Hyperlipidemia    Hypertension    Hypertension 08/07/2016   Osteoporosis    Small cell lung cancer (HCC) dx'd 05/2016    ALLERGIES:  is allergic to codeine, motrin [ibuprofen], thiazide-type diuretics, and vicodin [hydrocodone-acetaminophen].  MEDICATIONS:  Current Outpatient Medications  Medication Sig Dispense Refill   atorvastatin (LIPITOR) 80 MG tablet TAKE ONE TABLET BY MOUTH EVERY DAY 30 tablet 11   clopidogrel (PLAVIX) 75 MG tablet TAKE ONE TABLET BY MOUTH EVERY DAY 90 tablet 0   diazepam (  VALIUM) 5 MG tablet Take 1 tablet (5 mg total) by mouth every 8 (eight) hours as needed for anxiety. 45 tablet 0   lidocaine-prilocaine (EMLA) cream Apply 1 Application topically as needed. 30 g 2   ondansetron (ZOFRAN) 8 MG tablet Take 1 tablet (8 mg total) by mouth every 8 (eight) hours as needed for nausea or vomiting. 20 tablet 0   oxyCODONE-acetaminophen (PERCOCET/ROXICET) 5-325 MG tablet Take 1  tablet by mouth every 6 (six) hours as needed for severe pain. 60 tablet 0   potassium chloride 20 MEQ/15ML (10%) SOLN Take 15 mLs (20 mEq total) by mouth 2 (two) times daily. 210 mL 0   traMADol (ULTRAM) 50 MG tablet Take 1 tablet (50 mg total) by mouth every 6 (six) hours as needed. 60 tablet 0   triamcinolone cream (KENALOG) 0.1 % Apply 1 Application topically 2 (two) times daily. 80 g 2   No current facility-administered medications for this visit.   Facility-Administered Medications Ordered in Other Visits  Medication Dose Route Frequency Provider Last Rate Last Admin   sodium chloride flush (NS) 0.9 % injection 10 mL  10 mL Intracatheter PRN Si Gaul, MD   10 mL at 06/18/23 0803    SURGICAL HISTORY:  Past Surgical History:  Procedure Laterality Date   ABDOMINAL HYSTERECTOMY  1981   APPENDECTOMY     at age 77   EYE SURGERY     left   IR GENERIC HISTORICAL  06/03/2016   IR FLUORO GUIDE PORT INSERTION RIGHT 06/03/2016 [facility]   IR GENERIC HISTORICAL  06/03/2016   IR US GUIDE VASC ACCESS RIGHT 06/03/2016 [facility]   SKIN CANCER EXCISION     basal cell carcinoma L side of face    REVIEW OF SYSTEMS:  A comprehensive review of systems was negative except for: Constitutional: positive for fatigue Respiratory: positive for dyspnea on exertion Gastrointestinal: positive for nausea Musculoskeletal: positive for arthralgias and muscle weakness   PHYSICAL EXAMINATION: General appearance: alert, cooperative, fatigued, and no distress Head: Normocephalic, without obvious abnormality, atraumatic Neck: no adenopathy, no JVD, supple, symmetrical, trachea midline, and thyroid not enlarged, symmetric, no tenderness/mass/nodules Lymph nodes: Cervical, supraclavicular, and axillary nodes normal. Resp: clear to auscultation bilaterally Back: symmetric, no curvature. ROM normal. No CVA tenderness. Cardio: regular rate and rhythm, S1, S2 normal, no murmur, click, rub or gallop GI:  soft, non-tender; bowel sounds normal; no masses,  no organomegaly Extremities: extremities normal, atraumatic, no cyanosis or edema  ECOG PERFORMANCE STATUS: 1 - Symptomatic but completely ambulatory  Blood pressure 102/76, pulse 91, temperature 98.7 F (37.1 C), temperature source Oral, resp. rate 16, height 5\' 3"  (1.6 m), weight 97 lb 12.8 oz (44.4 kg), SpO2 100%.  LABORATORY DATA: Lab Results  Component Value Date   WBC 8.7 05/28/2023   HGB 11.1 (L) 05/28/2023   HCT 32.9 (L) 05/28/2023   MCV 106.5 (H) 05/28/2023   PLT 231 05/28/2023      Chemistry      Component Value Date/Time   NA 139 05/28/2023 0759   NA 141 08/01/2017 0835   K 3.1 (L) 05/28/2023 0759   K 3.5 08/01/2017 0835   CL 105 05/28/2023 0759   CO2 26 05/28/2023 0759   CO2 25 08/01/2017 0835   BUN 5 (L) 05/28/2023 0759   BUN 8.0 08/01/2017 0835   CREATININE 0.72 05/28/2023 0759   CREATININE 0.9 08/01/2017 0835      Component Value Date/Time   CALCIUM 8.1 (L) 05/28/2023 0759  CALCIUM 9.9 08/01/2017 0835   ALKPHOS 121 05/28/2023 0759   ALKPHOS 142 08/01/2017 0835   AST 20 05/28/2023 0759   AST 15 08/01/2017 0835   ALT 12 05/28/2023 0759   ALT 19 08/01/2017 0835   BILITOT 0.5 05/28/2023 0759   BILITOT 0.35 08/01/2017 0835       RADIOGRAPHIC STUDIES: No results found.  ASSESSMENT AND PLAN:  This is a very pleasant 62 years old white female with extensive stage small cell lung cancer status post systemic chemotherapy with carbo platinum and etoposide for 6 cycles and the patient rotated her treatment well except for chemotherapy-induced anemia and requirement for PRBCs and platelet transfusion. She had significant improvement in her disease with the chemotherapy. She also had prophylactic cranial irradiation. She also underwent stereotactic radiotherapy to progressive right upper lobe pulmonary nodule. The patient has been in observation for close to 2 years. Repeat CT scan of the chest, abdomen and  pelvis performed at that time showed evidence for disease progression in the abdomen. The patient started on systemic chemotherapy again with carboplatin, etoposide and Tecentriq status post 81 cycles.  Starting from cycle #5 the patient is treated with maintenance single agent Tecentriq.    Extensive Stage Small Cell Lung Cancer On immunotherapy with Atezolizumab with 75 cycles completed. Tolerating treatment well. Recent weight loss due to decreased appetite and nausea associated with Tramadol. -Continue Atezolizumab as planned today. -Encourage snacking between meals to maintain weight. -Discontinue Tramadol due to associated nausea. -Order CT scan of chest, abdomen, and pelvis to be done one week prior to next visit.  Shortness of Breath Intermittent, likely related to underlying lung cancer. -Monitor and reassess at next visit.  General Health Maintenance / Followup Plans -Return in three weeks for follow-up and treatment. -Complete paperwork with Diane.    For the anxiety and pain management, she was referred to the palliative care team and she has an appointment with them today. For the history of a stroke she is currently on treatment with Plavix and aspirin and she is followed by neurology. The patient was advised to call immediately if she has any other concerning symptoms in the interval. All questions were answered. The patient knows to call the clinic with any problems, questions or concerns. We can certainly see the patient much sooner if necessary.  Disclaimer: This note was dictated with voice recognition software. Similar sounding words can inadvertently be transcribed and may not be corrected upon review.

## 2023-06-18 NOTE — Patient Instructions (Signed)
[facility]  Discharge Instructions: Thank you for choosing [facility] to provide your oncology and hematology care.   If you have a lab appointment with the Cancer Center, please go directly to the Cancer Center and check in at the registration area.   Wear comfortable clothing and clothing appropriate for easy access to any Portacath or PICC line.   We strive to give you quality time with your provider. You may need to reschedule your appointment if you arrive late (15 or more minutes).  Arriving late affects you and other patients whose appointments are after yours.  Also, if you miss three or more appointments without notifying the office, you may be dismissed from the clinic at the provider's discretion.      For prescription refill requests, have your pharmacy contact our office and allow 72 hours for refills to be completed.    Today you received the following chemotherapy and/or immunotherapy agents: atezolizumab       To help prevent nausea and vomiting after your treatment, we encourage you to take your nausea medication as directed.  BELOW ARE SYMPTOMS THAT SHOULD BE REPORTED IMMEDIATELY: *FEVER GREATER THAN 100.4 F (38 C) OR HIGHER *CHILLS OR SWEATING *NAUSEA AND VOMITING THAT IS NOT CONTROLLED WITH YOUR NAUSEA MEDICATION *UNUSUAL SHORTNESS OF BREATH *UNUSUAL BRUISING OR BLEEDING *URINARY PROBLEMS (pain or burning when urinating, or frequent urination) *BOWEL PROBLEMS (unusual diarrhea, constipation, pain near the anus) TENDERNESS IN MOUTH AND THROAT WITH OR WITHOUT PRESENCE OF ULCERS (sore throat, sores in mouth, or a toothache) UNUSUAL RASH, SWELLING OR PAIN  UNUSUAL VAGINAL DISCHARGE OR ITCHING   Items with * indicate a potential emergency and should be followed up as soon as possible or go to the Emergency Department if any problems should occur.  Please show the CHEMOTHERAPY ALERT CARD or IMMUNOTHERAPY ALERT CARD at  check-in to the Emergency Department and triage nurse.  Should you have questions after your visit or need to cancel or reschedule your appointment, please contact [place] [facility]  Dept: [phone]  and follow the prompts.  Office hours are 8:00 a.m. to 4:30 p.m. Monday - Friday. Please note that voicemails left after 4:00 p.m. may not be returned until the following business day.  We are closed weekends and major holidays. You have access to a nurse at all times for urgent questions. Please call the main number to the clinic Dept: [phone] and follow the prompts.   For any non-urgent questions, you may also contact your provider using MyChart. We now offer e-Visits for anyone 71 and older to request care online for non-urgent symptoms. For details visit [email or web address].   Also download the MyChart app! Go to the app store, search "MyChart", open the app, select [place], and log in with your MyChart username and password.

## 2023-06-18 NOTE — Progress Notes (Signed)
Nutrition Follow-up:  Patient with extensive stage small cell lung cancer. She is currently receiving maintenance treatment with single agent Tecentriq 1200 mg IV every 3 weeks.    Met with patient and husband in infusion. She reports decreased appetite secondary to smell aversions. Foods sound good, but unable to tolerate smell of foods cooking. She reports nausea with episode of vomiting with oblong shaped tramadol recently prescribed. Patient says she only tolerates the round ones.     Medications: reviewed   Labs: K 3.1, glucose 120, BUN 5, albumin 3.2  Anthropometrics: Wt 97 lb 12.8 oz today   9/4 - 98 lb 9.6 oz  8/14 - 97 lb 6.4 oz    NUTRITION DIAGNOSIS: Unintended wt loss - continues    INTERVENTION:  Continue to encourage high calorie high protein foods  Discussed strategies for nausea, foods best tolerated and foods to avoid One bag of food + one bag of toiletries from [employer]     MONITORING, EVALUATION, GOAL: wt trends, intake   NEXT VISIT: Tuesday November 26 during infusion

## 2023-06-25 ENCOUNTER — Encounter: Payer: Self-pay | Admitting: Internal Medicine

## 2023-07-03 ENCOUNTER — Ambulatory Visit (HOSPITAL_COMMUNITY)
Admission: RE | Admit: 2023-07-03 | Discharge: 2023-07-03 | Disposition: A | Discharge status: Home / Self Care | Payer: Medicare Other | Source: Ambulatory Visit | Attending: Internal Medicine | Admitting: Internal Medicine

## 2023-07-03 ENCOUNTER — Other Ambulatory Visit: Payer: Self-pay | Admitting: Internal Medicine

## 2023-07-03 DIAGNOSIS — C3490 Malignant neoplasm of unspecified part of unspecified bronchus or lung: Secondary | ICD-10-CM

## 2023-07-03 DIAGNOSIS — C3411 Malignant neoplasm of upper lobe, right bronchus or lung: Secondary | ICD-10-CM

## 2023-07-03 MED ORDER — HEPARIN SOD (PORK) LOCK FLUSH 100 UNIT/ML IV SOLN
INTRAVENOUS | Status: AC
  Filled 2023-07-03: qty 5

## 2023-07-03 MED ORDER — IOHEXOL 9 MG/ML PO SOLN
ORAL | Status: AC
  Filled 2023-07-03: qty 1000

## 2023-07-03 MED ORDER — HEPARIN SOD (PORK) LOCK FLUSH 100 UNIT/ML IV SOLN
500.0000 [IU] | Freq: Once | INTRAVENOUS | Status: AC
  Administered 2023-07-03: 500 [IU] via INTRAVENOUS

## 2023-07-03 MED ORDER — IOHEXOL 300 MG/ML  SOLN
100.0000 mL | Freq: Once | INTRAMUSCULAR | Status: AC | PRN
  Administered 2023-07-03: 100 mL via INTRAVENOUS

## 2023-07-03 MED ORDER — IOHEXOL 9 MG/ML PO SOLN
1000.0000 mL | ORAL | Status: AC
  Administered 2023-07-03: 1000 mL via ORAL

## 2023-07-03 NOTE — Progress Notes (Signed)
[facility] OFFICE PROGRESS NOTE  Pcp, [name], N3, [name], large right anterior mediastinal and supraclavicular lymphadenopathy as well as pancreatic and splenic metastasis diagnosed in September 2017.  The patient had disease progression in October 2019.   PRIOR THERAPY: 1) Systemic chemotherapy was carboplatin for AUC of 5 on day 1 and etoposide 100 MG/M2 on days 1, 2 and 3 with Neulasta support. Status post 6 cycles with significant response of her disease. 2) Prophylactic cranial irradiation under the care of Dr. Roselind Messier on 12/02/2016. 3) stereotactic radiotherapy to the recurrent right upper lobe pulmonary nodule under the care of Dr. Roselind Messier completed November 10, 2017. 4) Retreatment with systemic chemotherapy with carboplatin for AUC of 5 on day 1 and etoposide 100 mg/M2 on days 1, 2 and 3 as well as Tecentriq (Atezolizumab) 1200 mg IV every 3 weeks with Neulasta support.  First dose June 22, 2018 for disease recurrence.  Status post 5 cycles.  Starting from cycle #2 her dose of carboplatin will be reduced to AUC of 4 and etoposide 80 mg/M2 on days 1, 2 and 3 in addition to the regular dose of Tecentriq.  CURRENT THERAPY: Maintenance treatment with single agent Tecentriq 1200 mg IV every 3 weeks.  Status post 82 cycles.    INTERVAL HISTORY: Debra Kidd 62 y.o. female returns to the clinic today for a follow-up visit accompanied by her husband.  The patient is currently being followed for metastatic small cell lung cancer for several years for which she has been on maintenance Tecentriq IV every 3 weeks which is great considering she is in overall poor health due to her other comorbidity such as history of stroke/left sided weakness and significant vascular stenosis. Her BP is unequal in her extremities. She previously was seen by neurology but was not candidate for  invasive intervention per patient's husband. Her BP is low today. She did not eat breakfast.   She does tolerate this well except for dry skin and itching, which she did not mention any new concerns related to this at this appointment.   She is followed closely by palliative care and nutrition.  She is scheduled to see palliative care at her next appointment.  She has a lot of financial constraints. She denies any fever or night sweats. She reports she is cold natured and denies changes. She denies any nausea or vomiting unless secondary to taking a certain medication may trigger N/V. She had one episode of N/V since last being seen. She reports her baseline dyspnea on exertion which is stable without any chest pain or hemoptysis.  She reports a baseline mild cough. She continues to smoke approximately 2 packs of cigarettes per day. She has intermittent diarrhea alternating with constipation. She frequently has hypokalemia on labs.  She cannot tolerate large pills and therefore takes liquid potassium.  He continues to have baseline fatigue and she is overall deconditioned and not very active at home. She recently had a restaging CT scan performed.    She is here for evaluation and repeat blood work before starting cycle #83  MEDICAL HISTORY: Past Medical History:  Diagnosis Date   Basal cell carcinoma of cheek    L side of face   Blood type, Rh positive    Cancer (HCC)    Chronic pain syndrome    Depression 08/07/2016   Encounter for antineoplastic chemotherapy  07/17/2016   History of external beam radiation therapy 11/19/16-12/02/16   brain 25 Gy in 10 fractions   Hyperlipidemia    Hypertension    Hypertension 08/07/2016   Osteoporosis    Small cell lung cancer (HCC) dx'd 05/2016    ALLERGIES:  is allergic to codeine, motrin [ibuprofen], thiazide-type diuretics, and vicodin [hydrocodone-acetaminophen].  MEDICATIONS:  Current Outpatient Medications  Medication Sig Dispense Refill    prochlorperazine (COMPAZINE) 10 MG tablet Take 1 tablet (10 mg total) by mouth every 6 (six) hours as needed. 30 tablet 2   atorvastatin (LIPITOR) 80 MG tablet TAKE ONE TABLET BY MOUTH EVERY DAY 30 tablet 11   clopidogrel (PLAVIX) 75 MG tablet TAKE ONE TABLET BY MOUTH EVERY DAY 90 tablet 0   diazepam (VALIUM) 5 MG tablet TAKE ONE TABLET BY MOUTH EVERY 8 HOURS AS NEEDED FOR ANXIETY 45 tablet 0   lidocaine-prilocaine (EMLA) cream Apply 1 Application topically as needed. 30 g 2   ondansetron (ZOFRAN) 8 MG tablet Take 1 tablet (8 mg total) by mouth every 8 (eight) hours as needed for nausea or vomiting. 20 tablet 0   oxyCODONE-acetaminophen (PERCOCET/ROXICET) 5-325 MG tablet Take 1 tablet by mouth every 6 (six) hours as needed for severe pain. 60 tablet 0   potassium chloride 20 MEQ/15ML (10%) SOLN Take 15 mLs (20 mEq total) by mouth 2 (two) times daily. 210 mL 0   traMADol (ULTRAM) 50 MG tablet Take 1 tablet (50 mg total) by mouth every 6 (six) hours as needed. 60 tablet 0   triamcinolone cream (KENALOG) 0.1 % Apply 1 Application topically 2 (two) times daily. 80 g 2   No current facility-administered medications for this visit.    SURGICAL HISTORY:  Past Surgical History:  Procedure Laterality Date   ABDOMINAL HYSTERECTOMY  1981   APPENDECTOMY     at age 56   EYE SURGERY     left   IR GENERIC HISTORICAL  06/03/2016   IR FLUORO GUIDE PORT INSERTION RIGHT 06/03/2016 [facility]   IR GENERIC HISTORICAL  06/03/2016   IR US GUIDE VASC ACCESS RIGHT 06/03/2016 [facility]   SKIN CANCER EXCISION     basal cell carcinoma L side of face    REVIEW OF SYSTEMS:   Constitutional: Positive for stable decreased appetite and fatigue. Negative for chills, fever and unexpected weight change.  HENT: Negative for mouth sores, nosebleeds, sore throat and trouble swallowing.  Eyes: Negative for eye problems and icterus.  Respiratory: Positive for stable SOB and mild cough which are her baseline. Negative  for hemoptysis, and wheezing.   Cardiovascular: Negative for chest pain and leg swelling.  Gastrointestinal: Alternates between diarrhea and constipation.  Negative for abdominal pain.  Denies any nausea or vomiting at this time.  Genitourinary: Negative for bladder incontinence, difficulty urinating, dysuria, frequency and hematuria.   Musculoskeletal: Positive for chronic back pain.  Negative for gait problem, neck pain and neck stiffness.  Skin: Positive for occasional itching. Negative for rash.  Neurological: Positive for generalized weakness.  Positive for left-sided weakness due to her history of stroke.  Negative for extremity weakness, gait problem, [name], depression and sleep disturbance. The patient is not nervous/anxious   PHYSICAL EXAMINATION:  Blood pressure (!) 82/69, pulse 83, temperature 98.3 F (36.8 C), temperature source Tympanic, resp. rate 18, height 5\' 3"  (1.6 m), weight 96 lb 14.4 oz (44 kg), SpO2 100%.  ECOG PERFORMANCE STATUS: 2-3  Physical Exam  Constitutional: Oriented to person, place, and time and chronically ill appearing female appears older than stated age and in no distress. HENT: Head: Normocephalic and atraumatic. Mouth/Throat: Oropharynx is clear and moist. No oropharyngeal exudate. Eyes: Conjunctivae are normal. Right eye exhibits no discharge. Left eye exhibits no discharge. No scleral icterus. Neck: Normal range of motion. Neck supple. Cardiovascular: Normal rate, regular rhythm, normal heart sounds and intact distal pulses.   Pulmonary/Chest: Effort normal.  Quiet breath sounds in all lung fields.  No respiratory distress. No wheezes. No rales. Abdominal: Soft. Bowel sounds are normal. Exhibits no distension and no mass. There is no tenderness.  Musculoskeletal: She has limited range of motion in her left  shoulder secondary to pain.  Hands are well-perfused and warm bilaterally.  Exhibits no edema.  Newness to palpation over the lateral aspect of the upper humerus. Lymphadenopathy:    slightly enlarged symmetric left and right upper cervical lymph node.  Neurological: Alert and oriented to person, place, and time. Exhibits muscle wasting. Examined in the wheelchair. Chronic left sided weakness due to hx of CVA.  Skin: Skin is warm and dry. No rash noted. Not diaphoretic. No erythema. No pallor.  Psychiatric: Mood, memory and judgment normal. Vitals reviewed.  LABORATORY DATA: Lab Results  Component Value Date   WBC 5.9 07/09/2023   HGB 11.3 (L) 07/09/2023   HCT 33.2 (L) 07/09/2023   MCV 106.1 (H) 07/09/2023   PLT 271 07/09/2023      Chemistry      Component Value Date/Time   NA PENDING 07/09/2023 0754   NA 141 08/01/2017 0835   K 2.6 (LL) 07/09/2023 0754   K 3.5 08/01/2017 0835   CL PENDING 07/09/2023 0754   CO2 PENDING 07/09/2023 0754   CO2 25 08/01/2017 0835   BUN <5 (L) 07/09/2023 0754   BUN 8.0 08/01/2017 0835   CREATININE PENDING 07/09/2023 0754   CREATININE 0.9 08/01/2017 0835      Component Value Date/Time   CALCIUM PENDING 07/09/2023 0754   CALCIUM 9.9 08/01/2017 0835   ALKPHOS PENDING 07/09/2023 0754   ALKPHOS 142 08/01/2017 0835   AST PENDING 07/09/2023 0754   AST 15 08/01/2017 0835   ALT PENDING 07/09/2023 0754   ALT 19 08/01/2017 0835   BILITOT PENDING 07/09/2023 0754   BILITOT 0.35 08/01/2017 0835       RADIOGRAPHIC STUDIES:  CT CHEST ABDOMEN PELVIS W CONTRAST  Result Date: 07/09/2023 CLINICAL DATA:  Small-cell lung cancer. Restaging. * Tracking Code: BO * EXAM: CT CHEST, ABDOMEN, AND PELVIS WITH CONTRAST TECHNIQUE: Multidetector CT imaging of the chest, abdomen and pelvis was performed following the standard protocol during bolus administration of intravenous contrast. RADIATION DOSE REDUCTION: This exam was performed according to the departmental  dose-optimization program which includes automated exposure control, adjustment of the mA and/or kV according to patient size and/or use of iterative reconstruction technique. CONTRAST:  OMNIPAQUE IOHEXOL 300 MG/ML  SOLN COMPARISON:  03/20/2023 FINDINGS: CT CHEST FINDINGS Cardiovascular: The heart size is normal. No substantial pericardial effusion. Coronary artery calcification is evident. Mild atherosclerotic calcification is noted in the wall of the thoracic aorta. Areas of ulcerated plaque/penetrating ulcer in the transverse aorta are similar to prior. Right Port-A-Cath tip is positioned in the low SVC. Mediastinum/Nodes: No mediastinal lymphadenopathy. There is no hilar lymphadenopathy. The esophagus has normal imaging features. There is no axillary lymphadenopathy. Lungs/Pleura: Post treatment scarring in the anterolateral right apex is stable. No  new suspicious pulmonary nodule or mass. No focal airspace consolidation. There is no evidence of pleural effusion. Musculoskeletal: No worrisome lytic or sclerotic osseous abnormality. Subtle sclerosis in the anterior right first rib is stable, likely treatment related. CT ABDOMEN PELVIS FINDINGS Hepatobiliary: No suspicious focal abnormality within the liver parenchyma. There is no evidence for gallstones, gallbladder wall thickening, or pericholecystic fluid. No intrahepatic or extrahepatic biliary dilation. Pancreas: No focal mass lesion. No dilatation of the main duct. No intraparenchymal cyst. No peripancreatic edema. Spleen: No splenomegaly. No suspicious focal mass lesion. Adrenals/Urinary Tract: No adrenal nodule or mass. Kidneys unremarkable. No evidence for hydroureter. The urinary bladder appears normal for the degree of distention. Stomach/Bowel: Stomach is unremarkable. No gastric wall thickening. No evidence of outlet obstruction. Duodenum is normally positioned as is the ligament of Treitz. No small bowel wall thickening. No small bowel  dilatation. The terminal ileum is normal. The appendix is not well visualized, but there is no edema or inflammation in the region of the cecal tip to suggest appendicitis. No gross colonic mass. No colonic wall thickening. Vascular/Lymphatic: There is moderate atherosclerotic calcification of the abdominal aorta without aneurysm. There is no gastrohepatic or hepatoduodenal ligament lymphadenopathy. No retroperitoneal or mesenteric lymphadenopathy. No pelvic sidewall lymphadenopathy. Reproductive: Hysterectomy.  There is no adnexal mass. Other: No intraperitoneal free fluid. Musculoskeletal: No worrisome lytic or sclerotic osseous abnormality. Multilevel thoracolumbar compression fractures are stable. IMPRESSION: 1. Stable exam. No new or progressive findings to suggest recurrent or metastatic disease in the chest, abdomen, or pelvis. 2. Stable post treatment scarring in the anterolateral right apex. 3.  Aortic Atherosclerosis (ICD10-I70.0). Electronically Signed   By: Kennith Center M.D.   On: 07/09/2023 08:00     ASSESSMENT/PLAN:  This is a very pleasant 62 year old Caucasian female with extensive stage small cell lung cancer.  She presented with a right upper lobe lung mass, large right anterior mediastinal and supraclavicular lymphadenopathy as well as a pancreatic and splenic metastasis.  She was diagnosed in September 2017.    The patient underwent systemic chemotherapy with carboplatin and etoposide.  She is status post 6 cycles.  She tolerated treatment well except for chemotherapy-induced anemia which required  pRBCs and platelet transfusions.  She had a significant improvement of her disease with chemotherapy. The patient then underwent prophylactic cranial irradiation. She then underwent stereotactic radiotherapy to the right upper lobe and pulmonary nodule.   She had been on observation for 2 years before showing evidence of disease progression.    She then was started on systemic chemotherapy  with carboplatin, etoposide, and Tecentriq. Starting from cycle #5, she has been on maintenance single agent Tecentriq.  She has been tolerating treatment well. She is status post 82 cycles of Tecentriq total.   Labs were reviewed. Recommend that she proceed with cycle #83 today as schedule.   The patient was seen with Dr. Arbutus Ped today.  Dr. Arbutus Ped personally and independently reviewed the scan and discussed results with the patient today.  The scan showed no evidence of disease progression.  Dr. Arbutus Ped recommend she continue on the same treatment at the same dose.  Her CMP shows low potassium at 2.6 today.  We will arrange for her to receive 20 mEq IV today and then I will refill her liquid potassium to take twice a day for 10 days.  Discussed that hypokalemia can cause fatal arrhythmias and it is our recommendation to receive IV potassium today and to be compliant with her potassium at home.  We will also offer her food and drinks and recheck her blood pressure.  She does have known unequal blood pressure and significant vascular stenosis that is multifocal.  She will continue kenalog cream if needed.   Labs were reviewed. Recommend that she proceed with cycle #83 today as schedule.   We will see her back in 3 weeks for evaluation and repeat blood work before considering starting cycle #84.   The patient was advised to call immediately if she has any concerning symptoms in the interval. The patient voices understanding of current disease status and treatment options and is in agreement with the current care plan. All questions were answered. The patient knows to call the clinic with any problems, questions or concerns. We can certainly see the patient much sooner if necessary   No orders of the defined types were placed in this encounter.    Tyronne Blann L Lucindia Lemley, PA-C 07/09/23  ADDENDUM: Hematology/Oncology Attending: I had a face-to-face encounter with the patient today.  I  reviewed her record, lab, scan and recommended her care plan.  This is a very pleasant 62 years old white female with extensive stage small cell lung cancer initially diagnosed in September 2017 as limited stage disease treated with systemic chemotherapy concurrent with radiation followed by prophylactic cranial irradiation.  The patient had evidence for disease recurrence in October 2019 and she was treated with systemic chemotherapy initially with carboplatin, etoposide and atezolizumab and she is currently on maintenance treatment with atezolizumab status post 82 cycles.  The patient has been tolerating her treatment fairly well with no concerning adverse effects. She had repeat CT scan of the chest, abdomen and pelvis performed recently.  I personally and independently reviewed the scan and discussed the result with the patient and her husband. Her scan showed no concerning findings for disease progression. I recommended for her to continue her current maintenance treatment with atezolizumab.  The patient will come back for follow-up visit in 3 weeks for evaluation before the next cycle of her treatment. She was advised to call immediately if she has any other concerning symptoms in the interval. The total time spent in the appointment was 30 minutes. Disclaimer: This note was dictated with voice recognition software. Similar sounding words can inadvertently be transcribed and may be missed upon review. Lajuana Matte, MD 07/09/23

## 2023-07-07 ENCOUNTER — Other Ambulatory Visit: Payer: Self-pay | Admitting: Nurse Practitioner

## 2023-07-07 DIAGNOSIS — G893 Neoplasm related pain (acute) (chronic): Secondary | ICD-10-CM

## 2023-07-07 DIAGNOSIS — F419 Anxiety disorder, unspecified: Secondary | ICD-10-CM

## 2023-07-07 DIAGNOSIS — C3491 Malignant neoplasm of unspecified part of right bronchus or lung: Secondary | ICD-10-CM

## 2023-07-09 ENCOUNTER — Inpatient Hospital Stay: Payer: Medicare Other

## 2023-07-09 ENCOUNTER — Inpatient Hospital Stay: Payer: Medicare Other | Attending: Internal Medicine

## 2023-07-09 ENCOUNTER — Inpatient Hospital Stay (HOSPITAL_BASED_OUTPATIENT_CLINIC_OR_DEPARTMENT_OTHER): Payer: Medicare Other | Admitting: Physician Assistant

## 2023-07-09 VITALS — BP 82/69 | HR 83 | Temp 98.3°F | Resp 18 | Ht 63.0 in | Wt 96.9 lb

## 2023-07-09 VITALS — BP 104/67 | HR 76 | Resp 18

## 2023-07-09 DIAGNOSIS — C3411 Malignant neoplasm of upper lobe, right bronchus or lung: Secondary | ICD-10-CM | POA: Diagnosis not present

## 2023-07-09 DIAGNOSIS — Z23 Encounter for immunization: Secondary | ICD-10-CM

## 2023-07-09 DIAGNOSIS — Z79899 Other long term (current) drug therapy: Secondary | ICD-10-CM | POA: Insufficient documentation

## 2023-07-09 DIAGNOSIS — C7889 Secondary malignant neoplasm of other digestive organs: Secondary | ICD-10-CM | POA: Diagnosis not present

## 2023-07-09 DIAGNOSIS — Z9221 Personal history of antineoplastic chemotherapy: Secondary | ICD-10-CM | POA: Diagnosis not present

## 2023-07-09 DIAGNOSIS — I700 Atherosclerosis of aorta: Secondary | ICD-10-CM | POA: Diagnosis not present

## 2023-07-09 DIAGNOSIS — C3491 Malignant neoplasm of unspecified part of right bronchus or lung: Secondary | ICD-10-CM

## 2023-07-09 DIAGNOSIS — R5383 Other fatigue: Secondary | ICD-10-CM

## 2023-07-09 DIAGNOSIS — K5900 Constipation, unspecified: Secondary | ICD-10-CM | POA: Diagnosis not present

## 2023-07-09 DIAGNOSIS — C778 Secondary and unspecified malignant neoplasm of lymph nodes of multiple regions: Secondary | ICD-10-CM | POA: Diagnosis not present

## 2023-07-09 DIAGNOSIS — M8008XA Age-related osteoporosis with current pathological fracture, vertebra(e), initial encounter for fracture: Secondary | ICD-10-CM | POA: Insufficient documentation

## 2023-07-09 DIAGNOSIS — Z5112 Encounter for antineoplastic immunotherapy: Secondary | ICD-10-CM

## 2023-07-09 DIAGNOSIS — Z923 Personal history of irradiation: Secondary | ICD-10-CM | POA: Diagnosis not present

## 2023-07-09 DIAGNOSIS — Z7902 Long term (current) use of antithrombotics/antiplatelets: Secondary | ICD-10-CM | POA: Diagnosis not present

## 2023-07-09 DIAGNOSIS — E876 Hypokalemia: Secondary | ICD-10-CM

## 2023-07-09 DIAGNOSIS — E785 Hyperlipidemia, unspecified: Secondary | ICD-10-CM | POA: Insufficient documentation

## 2023-07-09 DIAGNOSIS — R197 Diarrhea, unspecified: Secondary | ICD-10-CM | POA: Insufficient documentation

## 2023-07-09 DIAGNOSIS — I10 Essential (primary) hypertension: Secondary | ICD-10-CM | POA: Insufficient documentation

## 2023-07-09 DIAGNOSIS — F17210 Nicotine dependence, cigarettes, uncomplicated: Secondary | ICD-10-CM | POA: Diagnosis not present

## 2023-07-09 DIAGNOSIS — I959 Hypotension, unspecified: Secondary | ICD-10-CM | POA: Insufficient documentation

## 2023-07-09 LAB — CMP (CANCER CENTER ONLY)
ALT: 12 U/L (ref 0–44)
AST: 20 U/L (ref 15–41)
Albumin: 3.4 g/dL — ABNORMAL LOW (ref 3.5–5.0)
Alkaline Phosphatase: 127 U/L — ABNORMAL HIGH (ref 38–126)
Anion gap: 10 (ref 5–15)
BUN: <5 mg/dL — ABNORMAL LOW (ref 8–23)
CO2: 28 mmol/L (ref 22–32)
Calcium: 8.2 mg/dL — ABNORMAL LOW (ref 8.9–10.3)
Chloride: 101 mmol/L (ref 98–111)
Creatinine: 0.77 mg/dL (ref 0.44–1.00)
GFR, Estimated: >60 mL/min (ref >60)
Glucose, Bld: 120 mg/dL — ABNORMAL HIGH (ref 70–99)
Potassium: 2.6 mmol/L — CL (ref 3.5–5.1)
Sodium: 139 mmol/L (ref 135–145)
Total Bilirubin: 0.5 mg/dL (ref <1.2)
Total Protein: 6 g/dL — ABNORMAL LOW (ref 6.5–8.1)

## 2023-07-09 LAB — CBC WITH DIFFERENTIAL (CANCER CENTER ONLY)
Abs Immature Granulocytes: 0.02 10*3/uL (ref 0.00–0.07)
Basophils Absolute: 0 10*3/uL (ref 0.0–0.1)
Basophils Relative: 1 %
Eosinophils Absolute: 0.2 10*3/uL (ref 0.0–0.5)
Eosinophils Relative: 3 %
HCT: 33.2 % — ABNORMAL LOW (ref 36.0–46.0)
Hemoglobin: 11.3 g/dL — ABNORMAL LOW (ref 12.0–15.0)
Immature Granulocytes: 0 %
Lymphocytes Relative: 35 %
Lymphs Abs: 2.1 10*3/uL (ref 0.7–4.0)
MCH: 36.1 pg — ABNORMAL HIGH (ref 26.0–34.0)
MCHC: 34 g/dL (ref 30.0–36.0)
MCV: 106.1 fL — ABNORMAL HIGH (ref 80.0–100.0)
Monocytes Absolute: 0.3 10*3/uL (ref 0.1–1.0)
Monocytes Relative: 6 %
Neutro Abs: 3.3 10*3/uL (ref 1.7–7.7)
Neutrophils Relative %: 55 %
Platelet Count: 271 10*3/uL (ref 150–400)
RBC: 3.13 MIL/uL — ABNORMAL LOW (ref 3.87–5.11)
RDW: 13.7 % (ref 11.5–15.5)
WBC Count: 5.9 10*3/uL (ref 4.0–10.5)
nRBC: 0 % (ref 0.0–0.2)

## 2023-07-09 LAB — TSH: TSH: 6.274 u[IU]/mL — ABNORMAL HIGH (ref 0.350–4.500)

## 2023-07-09 MED ORDER — SODIUM CHLORIDE 0.9 % IV SOLN: Freq: Once | INTRAVENOUS | Status: AC

## 2023-07-09 MED ORDER — LIDOCAINE-PRILOCAINE 2.5-2.5 % EX CREA: 1.0000 | TOPICAL_CREAM | CUTANEOUS | 2 refills | Status: DC | PRN

## 2023-07-09 MED ORDER — SODIUM CHLORIDE 0.9 % IV SOLN
1200.0000 mg | Freq: Once | INTRAVENOUS | Status: AC
  Administered 2023-07-09: 1200 mg via INTRAVENOUS
  Filled 2023-07-09: qty 20

## 2023-07-09 MED ORDER — SODIUM CHLORIDE 0.9% FLUSH
10.0000 mL | INTRAVENOUS | Status: DC | PRN
  Administered 2023-07-09: 10 mL

## 2023-07-09 MED ORDER — HEPARIN SOD (PORK) LOCK FLUSH 100 UNIT/ML IV SOLN
500.0000 [IU] | Freq: Once | INTRAVENOUS | Status: AC | PRN
  Administered 2023-07-09: 500 [IU]

## 2023-07-09 MED ORDER — SODIUM CHLORIDE 0.9% FLUSH
10.0000 mL | INTRAVENOUS | Status: DC | PRN
Start: 2023-07-09 — End: 2023-07-09
  Administered 2023-07-09: 10 mL

## 2023-07-09 MED ORDER — INFLUENZA VIRUS VACC SPLIT PF (FLUZONE) 0.5 ML IM SUSY
0.5000 mL | PREFILLED_SYRINGE | Freq: Once | INTRAMUSCULAR | Status: AC
  Administered 2023-07-09: 0.5 mL via INTRAMUSCULAR
  Filled 2023-07-09: qty 0.5

## 2023-07-09 MED ORDER — POTASSIUM CHLORIDE 10 MEQ/100ML IV SOLN
10.0000 meq | INTRAVENOUS | Status: AC
  Administered 2023-07-09 (×2): 10 meq via INTRAVENOUS
  Filled 2023-07-09 (×2): qty 100

## 2023-07-09 MED ORDER — POTASSIUM CHLORIDE 20 MEQ/15ML (10%) PO SOLN: 20.0000 meq | Freq: Two times a day (BID) | ORAL | 0 refills | Status: DC

## 2023-07-09 MED ORDER — PROCHLORPERAZINE MALEATE 10 MG PO TABS: 10.0000 mg | ORAL_TABLET | Freq: Four times a day (QID) | ORAL | 2 refills | Status: DC | PRN

## 2023-07-09 NOTE — Progress Notes (Signed)
Per Cassie PA-C OK to proceed with tx with low potassium and low BP today.

## 2023-07-09 NOTE — Patient Instructions (Signed)
[facility] - A DEPT OF [facility]  Discharge Instructions: Thank you for choosing [facility] to provide your oncology and hematology care.   If you have a lab appointment with the Cancer Center, please go directly to the Cancer Center and check in at the registration area.   Wear comfortable clothing and clothing appropriate for easy access to any Portacath or PICC line.   We strive to give you quality time with your provider. You may need to reschedule your appointment if you arrive late (15 or more minutes).  Arriving late affects you and other patients whose appointments are after yours.  Also, if you miss three or more appointments without notifying the office, you may be dismissed from the clinic at the provider's discretion.      For prescription refill requests, have your pharmacy contact our office and allow 72 hours for refills to be completed.    Today you received the following chemotherapy and/or immunotherapy agents: Tecentriq      To help prevent nausea and vomiting after your treatment, we encourage you to take your nausea medication as directed.  BELOW ARE SYMPTOMS THAT SHOULD BE REPORTED IMMEDIATELY: *FEVER GREATER THAN 100.4 F (38 C) OR HIGHER *CHILLS OR SWEATING *NAUSEA AND VOMITING THAT IS NOT CONTROLLED WITH YOUR NAUSEA MEDICATION *UNUSUAL SHORTNESS OF BREATH *UNUSUAL BRUISING OR BLEEDING *URINARY PROBLEMS (pain or burning when urinating, or frequent urination) *BOWEL PROBLEMS (unusual diarrhea, constipation, pain near the anus) TENDERNESS IN MOUTH AND THROAT WITH OR WITHOUT PRESENCE OF ULCERS (sore throat, sores in mouth, or a toothache) UNUSUAL RASH, SWELLING OR PAIN  UNUSUAL VAGINAL DISCHARGE OR ITCHING   Items with * indicate a potential emergency and should be followed up as soon as possible or go to the Emergency Department if any problems should occur.  Please show the CHEMOTHERAPY ALERT CARD or IMMUNOTHERAPY  ALERT CARD at check-in to the Emergency Department and triage nurse.  Should you have questions after your visit or need to cancel or reschedule your appointment, please contact [facility] - A DEPT OF [facility]  Dept: [phone]  and follow the prompts.  Office hours are 8:00 a.m. to 4:30 p.m. Monday - Friday. Please note that voicemails left after 4:00 p.m. may not be returned until the following business day.  We are closed weekends and major holidays. You have access to a nurse at all times for urgent questions. Please call the main number to the clinic Dept: [phone] and follow the prompts.   For any non-urgent questions, you may also contact your provider using MyChart. We now offer e-Visits for anyone 75 and older to request care online for non-urgent symptoms. For details visit [email or web address].   Also download the MyChart app! Go to the app store, search "MyChart", open the app, select [facility], and log in with your MyChart username and password.

## 2023-07-23 NOTE — Progress Notes (Signed)
[facility] OFFICE PROGRESS NOTE  Pcp, [name], N3, [name], large right anterior mediastinal and supraclavicular lymphadenopathy as well as pancreatic and splenic metastasis diagnosed in September 2017.  The patient had disease progression in October 2019.   PRIOR THERAPY: 1) Systemic chemotherapy was carboplatin for AUC of 5 on day 1 and etoposide 100 MG/M2 on days 1, 2 and 3 with Neulasta support. Status post 6 cycles with significant response of her disease. 2) Prophylactic cranial irradiation under the care of Dr. Roselind Messier on 12/02/2016. 3) stereotactic radiotherapy to the recurrent right upper lobe pulmonary nodule under the care of Dr. Roselind Messier completed November 10, 2017. 4) Retreatment with systemic chemotherapy with carboplatin for AUC of 5 on day 1 and etoposide 100 mg/M2 on days 1, 2 and 3 as well as Tecentriq (Atezolizumab) 1200 mg IV every 3 weeks with Neulasta support.  First dose June 22, 2018 for disease recurrence.  Status post 5 cycles.  Starting from cycle #2 her dose of carboplatin will be reduced to AUC of 4 and etoposide 80 mg/M2 on days 1, 2 and 3 in addition to the regular dose of Tecentriq.  CURRENT THERAPY: Maintenance treatment with single agent Tecentriq 1200 mg IV every 3 weeks.  Status post 83 cycles.    INTERVAL HISTORY: Debra Kidd 62 y.o. female returns to the clinic today for a follow-up visit accompanied by her husband.   The patient is currently being followed for metastatic small cell lung cancer for several years for which she has been on maintenance Tecentriq IV every 3 weeks which is great considering she is in overall poor health due to her other comorbidity such as history of stroke/left sided weakness and significant vascular stenosis. Her BP is unequal in her extremities. She previously was seen by neurology but was not candidate for  invasive intervention per patient's husband.  She does tolerate this well except for dry skin and itching, which she did not mention any new concerns related to this at this appointment.    She is followed closely by palliative care and nutrition.  She is scheduled to see palliative care today and nutrition.   She has a lot of financial constraints. She denies any fever or night sweats. She reports she is cold natured and denies changes. She denies any nausea or vomiting unless secondary to taking a certain medication may trigger N/V. She did not have any nausea/vomiting since last being seen. She reports her baseline dyspnea on exertion which is stable without any chest pain or hemoptysis.  She reports a baseline mild cough. She continues to smoke approximately 2 packs of cigarettes per day. She has intermittent diarrhea alternating with constipation but her bowels have been pretty good since last being seen. She frequently has hypokalemia on labs.  She cannot tolerate large pills and therefore takes liquid potassium.  He continues to have baseline fatigue and she is overall deconditioned and not very active at home. She is here for evaluation and repeat blood work before undergoing cycle #84.    MEDICAL HISTORY: Past Medical History:  Diagnosis Date   Basal cell carcinoma of cheek    L side of face   Blood type, Rh positive    Cancer (HCC)    Chronic pain syndrome    Depression 08/07/2016   Encounter for antineoplastic chemotherapy 07/17/2016   History of external beam  radiation therapy 11/19/16-12/02/16   brain 25 Gy in 10 fractions   Hyperlipidemia    Hypertension    Hypertension 08/07/2016   Osteoporosis    Small cell lung cancer (HCC) dx'd 05/2016    ALLERGIES:  is allergic to codeine, motrin [ibuprofen], thiazide-type diuretics, and vicodin [hydrocodone-acetaminophen].  MEDICATIONS:  Current Outpatient Medications  Medication Sig Dispense Refill   atorvastatin (LIPITOR) 80 MG tablet  TAKE ONE TABLET BY MOUTH EVERY DAY 30 tablet 11   clopidogrel (PLAVIX) 75 MG tablet TAKE ONE TABLET BY MOUTH EVERY DAY 90 tablet 0   diazepam (VALIUM) 5 MG tablet TAKE ONE TABLET BY MOUTH EVERY 8 HOURS AS NEEDED FOR ANXIETY 45 tablet 0   lidocaine-prilocaine (EMLA) cream Apply 1 Application topically as needed. 30 g 2   ondansetron (ZOFRAN) 8 MG tablet Take 1 tablet (8 mg total) by mouth every 8 (eight) hours as needed for nausea or vomiting. 20 tablet 0   oxyCODONE-acetaminophen (PERCOCET/ROXICET) 5-325 MG tablet Take 1 tablet by mouth every 6 (six) hours as needed for severe pain. 60 tablet 0   potassium chloride 20 MEQ/15ML (10%) SOLN Take 15 mLs (20 mEq total) by mouth 2 (two) times daily. 300 mL 0   prochlorperazine (COMPAZINE) 10 MG tablet Take 1 tablet (10 mg total) by mouth every 6 (six) hours as needed. 30 tablet 2   traMADol (ULTRAM) 50 MG tablet Take 1 tablet (50 mg total) by mouth every 6 (six) hours as needed. 60 tablet 0   triamcinolone cream (KENALOG) 0.1 % Apply 1 Application topically 2 (two) times daily. 80 g 2   No current facility-administered medications for this visit.    SURGICAL HISTORY:  Past Surgical History:  Procedure Laterality Date   ABDOMINAL HYSTERECTOMY  1981   APPENDECTOMY     at age 54   EYE SURGERY     left   IR GENERIC HISTORICAL  06/03/2016   IR FLUORO GUIDE PORT INSERTION RIGHT 06/03/2016 [facility]INTER[facility]   IR GENERIC HISTORICAL  06/03/2016   IR US GUIDE VASC ACCESS RIGHT 06/03/2016 [facility]INTER[facility]   SKIN CANCER EXCISION     basal cell carcinoma L side of face    REVIEW OF SYSTEMS:   Constitutional: Positive for stable decreased appetite and fatigue. Negative for chills, fever and unexpected weight change.  HENT: Negative for mouth sores, nosebleeds, sore throat and trouble swallowing.  Eyes: Negative for eye problems and icterus.  Respiratory: Positive for stable SOB and mild cough which are her baseline. Negative for hemoptysis, and wheezing.    Cardiovascular: Negative for chest pain and leg swelling.  Gastrointestinal: Negative for abdominal pain, diarrhea, constipation.  Denies any nausea or vomiting at this time.  Genitourinary: Negative for bladder incontinence, difficulty urinating, dysuria, frequency and hematuria.   Musculoskeletal: Positive for chronic back pain.  Negative for gait problem, neck pain and neck stiffness.  Skin: Positive for occasional itching. Negative for rash.  Neurological: Positive for generalized weakness.  Positive for left-sided weakness due to her history of stroke.  Negative for extremity weakness, gait problem, [name], depression and sleep disturbance. The patient is not nervous/anxious       PHYSICAL EXAMINATION:  There were no vitals taken for this visit.  ECOG PERFORMANCE STATUS: 2-3  Physical Exam  Constitutional: Oriented to person, place, and time and chronically ill appearing female appears older than stated age and in no distress.  HENT: Head: Normocephalic and atraumatic. Mouth/Throat: Oropharynx is clear and moist. No oropharyngeal exudate. Eyes: Conjunctivae are normal. Right eye exhibits no discharge. Left eye exhibits no discharge. No scleral icterus. Neck: Normal range of motion. Neck supple. Cardiovascular: Normal rate, regular rhythm, murmur noted in right second intercostal space and intact distal pulses.   Pulmonary/Chest: Effort normal.  Quiet breath sounds in all lung fields.  No respiratory distress. No wheezes. No rales. Abdominal: Soft. Bowel sounds are normal. Exhibits no distension and no mass. There is no tenderness.  Musculoskeletal: She has limited range of motion in her left shoulder secondary to pain.   Exhibits no edema.   Lymphadenopathy:    slightly enlarged symmetric left and right upper cervical lymph node.   Neurological: Alert and oriented to person, place, and time. Exhibits muscle wasting. Examined in the wheelchair. Chronic left sided weakness due to hx of CVA.  Skin: Skin is warm and dry. No rash noted. Not diaphoretic. No erythema. No pallor.  Psychiatric: Mood, memory and judgment normal. Vitals reviewed.  LABORATORY DATA: Lab Results  Component Value Date   WBC 5.9 07/09/2023   HGB 11.3 (L) 07/09/2023   HCT 33.2 (L) 07/09/2023   MCV 106.1 (H) 07/09/2023   PLT 271 07/09/2023      Chemistry      Component Value Date/Time   NA 139 07/09/2023 0754   NA 141 08/01/2017 0835   K 2.6 (LL) 07/09/2023 0754   K 3.5 08/01/2017 0835   CL 101 07/09/2023 0754   CO2 28 07/09/2023 0754   CO2 25 08/01/2017 0835   BUN <5 (L) 07/09/2023 0754   BUN 8.0 08/01/2017 0835   CREATININE 0.77 07/09/2023 0754   CREATININE 0.9 08/01/2017 0835      Component Value Date/Time   CALCIUM 8.2 (L) 07/09/2023 0754   CALCIUM 9.9 08/01/2017 0835   ALKPHOS 127 (H) 07/09/2023 0754   ALKPHOS 142 08/01/2017 0835   AST 20 07/09/2023 0754   AST 15 08/01/2017 0835   ALT 12 07/09/2023 0754   ALT 19 08/01/2017 0835   BILITOT 0.5 07/09/2023 0754   BILITOT 0.35 08/01/2017 0835       RADIOGRAPHIC STUDIES:  CT CHEST ABDOMEN PELVIS W CONTRAST  Result Date: 07/09/2023 CLINICAL DATA:  Small-cell lung cancer. Restaging. * Tracking Code: BO * EXAM: CT CHEST, ABDOMEN, AND PELVIS WITH CONTRAST TECHNIQUE: Multidetector CT imaging of the chest, abdomen and pelvis was performed following the standard protocol during bolus administration of intravenous contrast. RADIATION DOSE REDUCTION: This exam was performed according to the departmental dose-optimization program which includes automated exposure control, adjustment of the mA and/or kV according to patient size and/or use of iterative reconstruction technique. CONTRAST:  OMNIPAQUE IOHEXOL 300 MG/ML  SOLN COMPARISON:  03/20/2023 FINDINGS: CT CHEST FINDINGS  Cardiovascular: The heart size is normal. No substantial pericardial effusion. Coronary artery calcification is evident. Mild atherosclerotic calcification is noted in the wall of the thoracic aorta. Areas of ulcerated plaque/penetrating ulcer in the transverse aorta are similar to prior. Right Port-A-Cath tip is positioned in the low SVC. Mediastinum/Nodes: No mediastinal lymphadenopathy. There is no hilar lymphadenopathy. The esophagus has normal imaging features. There is no axillary lymphadenopathy. Lungs/Pleura: Post treatment scarring in the anterolateral right apex is stable. No new suspicious pulmonary nodule or mass. No focal airspace consolidation. There is no evidence of pleural effusion. Musculoskeletal: No worrisome lytic or sclerotic osseous abnormality. Subtle sclerosis in the anterior right first rib is stable, likely treatment related. CT  ABDOMEN PELVIS FINDINGS Hepatobiliary: No suspicious focal abnormality within the liver parenchyma. There is no evidence for gallstones, gallbladder wall thickening, or pericholecystic fluid. No intrahepatic or extrahepatic biliary dilation. Pancreas: No focal mass lesion. No dilatation of the main duct. No intraparenchymal cyst. No peripancreatic edema. Spleen: No splenomegaly. No suspicious focal mass lesion. Adrenals/Urinary Tract: No adrenal nodule or mass. Kidneys unremarkable. No evidence for hydroureter. The urinary bladder appears normal for the degree of distention. Stomach/Bowel: Stomach is unremarkable. No gastric wall thickening. No evidence of outlet obstruction. Duodenum is normally positioned as is the ligament of Treitz. No small bowel wall thickening. No small bowel dilatation. The terminal ileum is normal. The appendix is not well visualized, but there is no edema or inflammation in the region of the cecal tip to suggest appendicitis. No gross colonic mass. No colonic wall thickening. Vascular/Lymphatic: There is moderate atherosclerotic  calcification of the abdominal aorta without aneurysm. There is no gastrohepatic or hepatoduodenal ligament lymphadenopathy. No retroperitoneal or mesenteric lymphadenopathy. No pelvic sidewall lymphadenopathy. Reproductive: Hysterectomy.  There is no adnexal mass. Other: No intraperitoneal free fluid. Musculoskeletal: No worrisome lytic or sclerotic osseous abnormality. Multilevel thoracolumbar compression fractures are stable. IMPRESSION: 1. Stable exam. No new or progressive findings to suggest recurrent or metastatic disease in the chest, abdomen, or pelvis. 2. Stable post treatment scarring in the anterolateral right apex. 3.  Aortic Atherosclerosis (ICD10-I70.0). Electronically Signed   By: Kennith Center M.D.   On: 07/09/2023 08:00     ASSESSMENT/PLAN:  This is a very pleasant 62 year old Caucasian female with extensive stage small cell lung cancer.  She presented with a right upper lobe lung mass, large right anterior mediastinal and supraclavicular lymphadenopathy as well as a pancreatic and splenic metastasis.  She was diagnosed in September 2017.    The patient underwent systemic chemotherapy with carboplatin and etoposide.  She is status post 6 cycles.  She tolerated treatment well except for chemotherapy-induced anemia which required  pRBCs and platelet transfusions.  She had a significant improvement of her disease with chemotherapy. The patient then underwent prophylactic cranial irradiation. She then underwent stereotactic radiotherapy to the right upper lobe and pulmonary nodule.   She had been on observation for 2 years before showing evidence of disease progression.    She then was started on systemic chemotherapy with carboplatin, etoposide, and Tecentriq. Starting from cycle #5, she has been on maintenance single agent Tecentriq.  She has been tolerating treatment well. She is status post 83 cycles of Tecentriq total.   Labs were reviewed. Recommend that she proceed with cycle #84  today as schedule.   We will see her back in 3 weeks for evaluation and repeat blood work before considering starting cycle #85.   The patient was advised to call immediately if she has any concerning symptoms in the interval. The patient voices understanding of current disease status and treatment options and is in agreement with the current care plan. All questions were answered. The patient knows to call the clinic with any problems, questions or concerns. We can certainly see the patient much sooner if necessary    No orders of the defined types were placed in this encounter.    The total time spent in the appointment was 20-29 minutes.  Tory Septer L Lorelie Biermann, PA-C 07/23/23

## 2023-07-25 NOTE — Progress Notes (Unsigned)
Palliative Medicine [facility]  [phone] Fax:([phone]) [phone]   Name: Debra Kidd Date: 07/25/2023 MRN: [id number]  DOB: 04-20-1961  Patient Care Team: Pcp, No as PCP - General    INTERVAL HISTORY: Debra Kidd is a 62 y.o. 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  Palliative ask to see for symptom management and goals of care.   SOCIAL HISTORY:     reports that she has been smoking cigarettes. She has a 86 pack-year smoking history. She has never used smokeless tobacco. She reports that she does not currently use alcohol. She reports current drug use. Frequency: 7.00 times per week. Drug: Marijuana.  ADVANCE DIRECTIVES:    CODE STATUS:   PAST MEDICAL HISTORY: Past Medical History:  Diagnosis Date   Basal cell carcinoma of cheek    L side of face   Blood type, Rh positive    Cancer (HCC)    Chronic pain syndrome    Depression 08/07/2016   Encounter for antineoplastic chemotherapy 07/17/2016   History of external beam radiation therapy 11/19/16-12/02/16   brain 25 Gy in 10 fractions   Hyperlipidemia    Hypertension    Hypertension 08/07/2016   Osteoporosis    Small cell lung cancer (HCC) dx'd 05/2016    ALLERGIES:  is allergic to codeine, motrin [ibuprofen], thiazide-type diuretics, and vicodin [hydrocodone-acetaminophen].  MEDICATIONS:  Current Outpatient Medications  Medication Sig Dispense Refill   atorvastatin (LIPITOR) 80 MG tablet TAKE ONE TABLET BY MOUTH EVERY DAY 30 tablet 11   clopidogrel (PLAVIX) 75 MG tablet TAKE ONE TABLET BY MOUTH EVERY DAY 90 tablet 0   diazepam (VALIUM) 5 MG tablet TAKE ONE TABLET BY MOUTH EVERY 8 HOURS AS NEEDED FOR ANXIETY 45 tablet 0   lidocaine-prilocaine (EMLA) cream Apply 1 Application topically as  needed. 30 g 2   ondansetron (ZOFRAN) 8 MG tablet Take 1 tablet (8 mg total) by mouth every 8 (eight) hours as needed for nausea or vomiting. 20 tablet 0   oxyCODONE-acetaminophen (PERCOCET/ROXICET) 5-325 MG tablet Take 1 tablet by mouth every 6 (six) hours as needed for severe pain. 60 tablet 0   potassium chloride 20 MEQ/15ML (10%) SOLN Take 15 mLs (20 mEq total) by mouth 2 (two) times daily. 300 mL 0   prochlorperazine (COMPAZINE) 10 MG tablet Take 1 tablet (10 mg total) by mouth every 6 (six) hours as needed. 30 tablet 2   traMADol (ULTRAM) 50 MG tablet Take 1 tablet (50 mg total) by mouth every 6 (six) hours as needed. 60 tablet 0   triamcinolone cream (KENALOG) 0.1 % Apply 1 Application topically 2 (two) times daily. 80 g 2   No current facility-administered medications for this visit.    VITAL SIGNS: There were no vitals taken for this visit. There were no vitals filed for this visit.  Estimated body mass index is 17.17 kg/m as calculated from the following:   Height as of 07/09/23: 5\' 3"  (1.6 m).   Weight as of 07/09/23: 96 lb 14.4 oz (44 kg).   PERFORMANCE STATUS (ECOG) : 1 - Symptomatic but completely ambulatory  Assessment NAD, sitting in recliner Normal breathing pattern RRR AAO x4  IMPRESSION: Debra Kidd presents to clinic for follow-up. No acute distress noted. Debra Kidd is present. Denies nausea, vomiting, constipation, or diarrhea. Is doing  well overall. Trying to remain as active as possible.   Chronic pain Debra Kidd endorses pain being well controlled.  Some days are better than others.  Associates increased pain with increased activity.    We reviewed patient's current regimen.  She is taking Tramadol for moderate pain. Takes at least one-two tablets daily.  Will continue to closely monitor and adjust medications as needed.   Constipation Controlled with daily regimen.    Anxiety/Insomnia Controlled. Taking Valium as needed.  States she will sometimes wake up  early in the morning and will have to calm herself down from shaking.   I discussed the importance of continued conversation with family and their medical providers regarding overall plan of care and treatment options, ensuring decisions are within the context of the patients values and GOCs.  PLAN:   Tramadol 50mg  every 8 hours as needed for moderate insurance.  Valium 5 mg 1 tablet every 8 hours as needed for anxiety/insomnia (does not require daily) Pain contract completed. Senna S daily for bowel regimen I will plan to see patient back in 3-4 weeks in collaboration to other oncology appointments   Patient expressed understanding and was in agreement with this plan. She also understands that She can call the clinic at any time with any questions, concerns, or complaints.   Any controlled substances utilized were prescribed in the context of palliative care. PDMP has been reviewed.    Visit consisted of counseling and education dealing with the complex and emotionally intense issues of symptom management and palliative care in the setting of serious and potentially life-threatening illness.Greater than 50%  of this time was spent counseling and coordinating care related to the above assessment and plan.  Debra Kidd, AGPCNP-BC  Palliative Medicine Team/[facility]  *Please note that this is a verbal dictation therefore any spelling or grammatical errors are due to the "[employer] One" system interpretation.

## 2023-07-28 ENCOUNTER — Encounter: Payer: Self-pay | Admitting: Internal Medicine

## 2023-07-29 ENCOUNTER — Inpatient Hospital Stay (HOSPITAL_BASED_OUTPATIENT_CLINIC_OR_DEPARTMENT_OTHER): Payer: Medicare Other | Admitting: Nurse Practitioner

## 2023-07-29 ENCOUNTER — Inpatient Hospital Stay: Payer: Medicare Other | Admitting: Dietician

## 2023-07-29 ENCOUNTER — Inpatient Hospital Stay: Payer: Medicare Other

## 2023-07-29 ENCOUNTER — Encounter: Payer: Self-pay | Admitting: Nurse Practitioner

## 2023-07-29 ENCOUNTER — Inpatient Hospital Stay (HOSPITAL_BASED_OUTPATIENT_CLINIC_OR_DEPARTMENT_OTHER): Payer: Medicare Other | Admitting: Physician Assistant

## 2023-07-29 VITALS — BP 100/68 | HR 79 | Temp 97.7°F | Resp 18

## 2023-07-29 VITALS — BP 115/91 | HR 87 | Temp 98.7°F | Resp 17 | Wt 97.2 lb

## 2023-07-29 DIAGNOSIS — Z5112 Encounter for antineoplastic immunotherapy: Secondary | ICD-10-CM

## 2023-07-29 DIAGNOSIS — G4709 Other insomnia: Secondary | ICD-10-CM | POA: Diagnosis not present

## 2023-07-29 DIAGNOSIS — F419 Anxiety disorder, unspecified: Secondary | ICD-10-CM | POA: Diagnosis not present

## 2023-07-29 DIAGNOSIS — G893 Neoplasm related pain (acute) (chronic): Secondary | ICD-10-CM | POA: Diagnosis not present

## 2023-07-29 DIAGNOSIS — Z515 Encounter for palliative care: Secondary | ICD-10-CM

## 2023-07-29 DIAGNOSIS — C3411 Malignant neoplasm of upper lobe, right bronchus or lung: Secondary | ICD-10-CM

## 2023-07-29 DIAGNOSIS — C3491 Malignant neoplasm of unspecified part of right bronchus or lung: Secondary | ICD-10-CM

## 2023-07-29 LAB — CMP (CANCER CENTER ONLY)
ALT: 10 U/L (ref 0–44)
AST: 20 U/L (ref 15–41)
Albumin: 3.4 g/dL — ABNORMAL LOW (ref 3.5–5.0)
Alkaline Phosphatase: 119 U/L (ref 38–126)
Anion gap: 7 (ref 5–15)
BUN: 6 mg/dL — ABNORMAL LOW (ref 8–23)
CO2: 27 mmol/L (ref 22–32)
Calcium: 8.3 mg/dL — ABNORMAL LOW (ref 8.9–10.3)
Chloride: 103 mmol/L (ref 98–111)
Creatinine: 0.79 mg/dL (ref 0.44–1.00)
GFR, Estimated: >60 mL/min (ref >60)
Glucose, Bld: 134 mg/dL — ABNORMAL HIGH (ref 70–99)
Potassium: 3.3 mmol/L — ABNORMAL LOW (ref 3.5–5.1)
Sodium: 137 mmol/L (ref 135–145)
Total Bilirubin: 0.5 mg/dL (ref <1.2)
Total Protein: 6 g/dL — ABNORMAL LOW (ref 6.5–8.1)

## 2023-07-29 LAB — CBC WITH DIFFERENTIAL (CANCER CENTER ONLY)
Abs Immature Granulocytes: 0.01 10*3/uL (ref 0.00–0.07)
Basophils Absolute: 0 10*3/uL (ref 0.0–0.1)
Basophils Relative: 0 %
Eosinophils Absolute: 0.2 10*3/uL (ref 0.0–0.5)
Eosinophils Relative: 3 %
HCT: 33.5 % — ABNORMAL LOW (ref 36.0–46.0)
Hemoglobin: 11.3 g/dL — ABNORMAL LOW (ref 12.0–15.0)
Immature Granulocytes: 0 %
Lymphocytes Relative: 33 %
Lymphs Abs: 2.2 10*3/uL (ref 0.7–4.0)
MCH: 36.5 pg — ABNORMAL HIGH (ref 26.0–34.0)
MCHC: 33.7 g/dL (ref 30.0–36.0)
MCV: 108.1 fL — ABNORMAL HIGH (ref 80.0–100.0)
Monocytes Absolute: 0.3 10*3/uL (ref 0.1–1.0)
Monocytes Relative: 5 %
Neutro Abs: 3.8 10*3/uL (ref 1.7–7.7)
Neutrophils Relative %: 59 %
Platelet Count: 247 10*3/uL (ref 150–400)
RBC: 3.1 MIL/uL — ABNORMAL LOW (ref 3.87–5.11)
RDW: 14.1 % (ref 11.5–15.5)
WBC Count: 6.5 10*3/uL (ref 4.0–10.5)
nRBC: 0 % (ref 0.0–0.2)

## 2023-07-29 MED ORDER — SODIUM CHLORIDE 0.9 % IV SOLN
1200.0000 mg | Freq: Once | INTRAVENOUS | Status: AC
  Administered 2023-07-29: 1200 mg via INTRAVENOUS
  Filled 2023-07-29: qty 20

## 2023-07-29 MED ORDER — TRAMADOL HCL 50 MG PO TABS: 50.0000 mg | ORAL_TABLET | Freq: Four times a day (QID) | ORAL | 0 refills | Status: DC | PRN

## 2023-07-29 MED ORDER — DIAZEPAM 5 MG PO TABS: 5.0000 mg | ORAL_TABLET | Freq: Three times a day (TID) | ORAL | 0 refills | Status: DC | PRN

## 2023-07-29 MED ORDER — HEPARIN SOD (PORK) LOCK FLUSH 100 UNIT/ML IV SOLN
500.0000 [IU] | Freq: Once | INTRAVENOUS | Status: AC | PRN
Start: 2023-07-29 — End: 2023-07-29
  Administered 2023-07-29: 500 [IU]

## 2023-07-29 MED ORDER — SODIUM CHLORIDE 0.9 % IV SOLN: Freq: Once | INTRAVENOUS | Status: AC

## 2023-07-29 MED ORDER — SODIUM CHLORIDE 0.9% FLUSH
10.0000 mL | INTRAVENOUS | Status: DC | PRN
  Administered 2023-07-29: 10 mL

## 2023-07-29 NOTE — Patient Instructions (Signed)
[facility] - A DEPT OF [facility]  Discharge Instructions: Thank you for choosing [facility] to provide your oncology and hematology care.   If you have a lab appointment with the Cancer Center, please go directly to the Cancer Center and check in at the registration area.   Wear comfortable clothing and clothing appropriate for easy access to any Portacath or PICC line.   We strive to give you quality time with your provider. You may need to reschedule your appointment if you arrive late (15 or more minutes).  Arriving late affects you and other patients whose appointments are after yours.  Also, if you miss three or more appointments without notifying the office, you may be dismissed from the clinic at the provider's discretion.      For prescription refill requests, have your pharmacy contact our office and allow 72 hours for refills to be completed.    Today you received the following chemotherapy and/or immunotherapy agents: Tecentriq      To help prevent nausea and vomiting after your treatment, we encourage you to take your nausea medication as directed.  BELOW ARE SYMPTOMS THAT SHOULD BE REPORTED IMMEDIATELY: *FEVER GREATER THAN 100.4 F (38 C) OR HIGHER *CHILLS OR SWEATING *NAUSEA AND VOMITING THAT IS NOT CONTROLLED WITH YOUR NAUSEA MEDICATION *UNUSUAL SHORTNESS OF BREATH *UNUSUAL BRUISING OR BLEEDING *URINARY PROBLEMS (pain or burning when urinating, or frequent urination) *BOWEL PROBLEMS (unusual diarrhea, constipation, pain near the anus) TENDERNESS IN MOUTH AND THROAT WITH OR WITHOUT PRESENCE OF ULCERS (sore throat, sores in mouth, or a toothache) UNUSUAL RASH, SWELLING OR PAIN  UNUSUAL VAGINAL DISCHARGE OR ITCHING   Items with * indicate a potential emergency and should be followed up as soon as possible or go to the Emergency Department if any problems should occur.  Please show the CHEMOTHERAPY ALERT CARD or IMMUNOTHERAPY  ALERT CARD at check-in to the Emergency Department and triage nurse.  Should you have questions after your visit or need to cancel or reschedule your appointment, please contact [facility] - A DEPT OF [facility]  Dept: [phone]  and follow the prompts.  Office hours are 8:00 a.m. to 4:30 p.m. Monday - Friday. Please note that voicemails left after 4:00 p.m. may not be returned until the following business day.  We are closed weekends and major holidays. You have access to a nurse at all times for urgent questions. Please call the main number to the clinic Dept: [phone] and follow the prompts.   For any non-urgent questions, you may also contact your provider using MyChart. We now offer e-Visits for anyone 2 and older to request care online for non-urgent symptoms. For details visit [email or web address].   Also download the MyChart app! Go to the app store, search "MyChart", open the app, select [place], and log in with your MyChart username and password.

## 2023-07-29 NOTE — Progress Notes (Signed)
Nutrition Follow-up:  Patient with extensive stage small cell lung cancer. She is currently receiving maintenance treatment with single agent Tecentriq 1200 mg IV every 3 weeks.     Met with patient and husband in infusion. Patient reports appetite is about the same. She is eating as much as she can. Patient is looking forward to Thanksgiving. Smells have not bothered her recently. Patient reports she has been taking liquid potassium as prescribed.    Medications: reviewed   Labs: K 3.3, BUN 6, albumin 3.4  Anthropometrics: Wt 97 lb 3.2 oz today   11/6 - 96 lb 14.4 oz  10/16 - 97 lb 12.8 oz 9/25 - 98 lb 8 oz    NUTRITION DIAGNOSIS: Unintended wt loss - stable   INTERVENTION:  Encouraged high calorie high protein foods  One bag of food from [employer] provided    MONITORING, EVALUATION, GOAL: wt trends, intake   NEXT VISIT: Wednesday December 18 during infusion

## 2023-07-29 NOTE — Patient Instructions (Signed)

## 2023-08-15 NOTE — Progress Notes (Unsigned)
Palliative Medicine [facility]  Telephone:([phone]) [phone] Fax:(336) [phone]   Name: Debra Kidd Date: 08/15/2023 MRN: [id number]  DOB: [birth year]  Patient Care Team: Pcp, No as PCP - General    INTERVAL HISTORY: Debra Kidd is a 62 y.o. 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  Palliative ask to see for symptom management and goals of care.   SOCIAL HISTORY:     reports that she has been smoking cigarettes. She has a 86 pack-year smoking history. She has never used smokeless tobacco. She reports that she does not currently use alcohol. She reports current drug use. Frequency: 7.00 times per week. Drug: Marijuana.  ADVANCE DIRECTIVES:    CODE STATUS:   PAST MEDICAL HISTORY: Past Medical History:  Diagnosis Date   Basal cell carcinoma of cheek    L side of face   Blood type, Rh positive    Cancer (HCC)    Chronic pain syndrome    Depression 08/07/2016   Encounter for antineoplastic chemotherapy 07/17/2016   History of external beam radiation therapy 11/19/16-12/02/16   brain 25 Gy in 10 fractions   Hyperlipidemia    Hypertension    Hypertension 08/07/2016   Osteoporosis    Small cell lung cancer (HCC) dx'd 05/2016    ALLERGIES:  is allergic to codeine, motrin [ibuprofen], thiazide-type diuretics, and vicodin [hydrocodone-acetaminophen].  MEDICATIONS:  Current Outpatient Medications  Medication Sig Dispense Refill   atorvastatin (LIPITOR) 80 MG tablet TAKE ONE TABLET BY MOUTH EVERY DAY 30 tablet 11   clopidogrel (PLAVIX) 75 MG tablet TAKE ONE TABLET BY MOUTH EVERY DAY 90 tablet 0   diazepam (VALIUM) 5 MG tablet Take 1 tablet (5 mg total) by mouth every 8 (eight) hours as needed. for anxiety 60 tablet 0   diphenhydrAMINE (BENADRYL) 25 mg capsule Take 25 mg  by mouth every 6 (six) hours as needed for allergies.     lidocaine-prilocaine (EMLA) cream Apply 1 Application topically as needed. 30 g 2   ondansetron (ZOFRAN) 8 MG tablet Take 1 tablet (8 mg total) by mouth every 8 (eight) hours as needed for nausea or vomiting. 20 tablet 0   potassium chloride 20 MEQ/15ML (10%) SOLN Take 15 mLs (20 mEq total) by mouth 2 (two) times daily. 300 mL 0   prochlorperazine (COMPAZINE) 10 MG tablet Take 1 tablet (10 mg total) by mouth every 6 (six) hours as needed. 30 tablet 2   traMADol (ULTRAM) 50 MG tablet Take 1 tablet (50 mg total) by mouth every 6 (six) hours as needed. 60 tablet 0   triamcinolone cream (KENALOG) 0.1 % Apply 1 Application topically 2 (two) times daily. 80 g 2   No current facility-administered medications for this visit.    VITAL SIGNS: There were no vitals taken for this visit. There were no vitals filed for this visit.  Estimated body mass index is 17.22 kg/m as calculated from the following:   Height as of 07/09/23: 5\' 3"  (1.6 m).   Weight as of 07/29/23: 97 lb 3.2 oz (44.1 kg).   PERFORMANCE STATUS (ECOG) : 1 - Symptomatic but completely ambulatory  Assessment NAD, sitting in recliner Normal breathing pattern RRR AAO x4  Discussed the use of AI scribe software for clinical note transcription with the patient, who gave verbal consent to proceed.  IMPRESSION:  Debra Kidd presents to clinic for symptom management follow-up. Her husband is present. No acute distress. She is taking things one day at a time. Denies nausea, vomiting, constipation, or diarrhea. Her appetite is good.   The patient reports her pain is well controlled on current regimen. She is taking Tramadol as needed. She is however complaining of increased tremors which she describes as interfering with her ability to perform daily tasks such as drinking. She reports that her current medication, Diazepam 5mg , is not providing the usual relief and she is having to  take it approximately three times a day. The patient notes that the medication's effect lasts for about three to four hours before the tremors begin to return. She has been on this medication since early 2023 originally prescribed by her PCP. She was dose increased to 10 mg for a brief period to 10mg  however decreased back down to 5mg  as prescribed by me in February 2024. She denies any adverse effects with increase dosage. The patient's blood pressure is noted to be on the lower side, at 96/68. The patient denies any other health concerns at this time. I expressed concerns with increasing dose at this time and consideration of other options. Patient and husband verbalized understanding. We will continue to closely monitor and manage symptoms in collaboration with her oncology medical team.  I discussed the importance of continued conversation with family and their medical providers regarding overall plan of care and treatment options, ensuring decisions are within the context of the patients values and GOCs.  PLAN:  Essential Tremor Patient reports increased tremors, currently on 5mg  of Diazepam as needed for anxiety which only provides relief for several hours per patient. Previously tolerated 10mg  without adverse effects. -Consult with supervising physician regarding increasing Diazepam to 10mg  due to severity of symptoms and patient's previous tolerance of this dose. -If approved, initiate a trial period of increased dosage and monitor for adverse effects, particularly falls and hypotension. -If not approved, continue current dosage of Diazepam 5mg  four times a day.  Insomnia/Anxiety Patient reports poor sleep, waking up multiple times during the night. No clear cause identified. Anxiety improved -Consider evaluation for potential causes of insomnia if it continues to be a problem. -Continue Valium as needed   Cancer Related Pain Patient reports pain is well controlled on current regimen. No  changes at this time.  -Continue Tramadol 50mg  every 6 hours as needed.    I will plan to follow-up with patient in 4-6 weeks.  Patient expressed understanding and was in agreement with this plan. She also understands that She can call the clinic at any time with any questions, concerns, or complaints.   Any controlled substances utilized were prescribed in the context of palliative care. PDMP has been reviewed.    Visit consisted of counseling and education dealing with the complex and emotionally intense issues of symptom management and palliative care in the setting of serious and potentially life-threatening illness.  [name], AGPCNP-BC  Palliative Medicine Team/[facility]

## 2023-08-15 NOTE — Progress Notes (Unsigned)
[facility] OFFICE PROGRESS NOTE  Pcp, [name], N3, [name], large right anterior mediastinal and supraclavicular lymphadenopathy as well as pancreatic and splenic metastasis diagnosed in September 2017.  The patient had disease progression in October 2019.   PRIOR THERAPY: 1) Systemic chemotherapy was carboplatin for AUC of 5 on day 1 and etoposide 100 MG/M2 on days 1, 2 and 3 with Neulasta support. Status post 6 cycles with significant response of her disease. 2) Prophylactic cranial irradiation under the care of Dr. Roselind Messier on 12/02/2016. 3) stereotactic radiotherapy to the recurrent right upper lobe pulmonary nodule under the care of Dr. Roselind Messier completed November 10, 2017. 4) Retreatment with systemic chemotherapy with carboplatin for AUC of 5 on day 1 and etoposide 100 mg/M2 on days 1, 2 and 3 as well as Tecentriq (Atezolizumab) 1200 mg IV every 3 weeks with Neulasta support.  First dose June 22, 2018 for disease recurrence.  Status post 5 cycles.  Starting from cycle #2 her dose of carboplatin will be reduced to AUC of 4 and etoposide 80 mg/M2 on days 1, 2 and 3 in addition to the regular dose of Tecentriq.    CURRENT THERAPY: Maintenance treatment with single agent Tecentriq 1200 mg IV every 3 weeks.  Status post 84 cycles.    INTERVAL HISTORY: Debra Kidd 62 y.o. female returns to the clinic today for a follow-up visit accompanied by her husband.   The patient is currently being followed for metastatic small cell lung cancer for several years for which she has been on maintenance Tecentriq IV every 3 weeks which is great considering she is in overall poor health due to her other comorbidity such as history of stroke/left sided weakness and significant vascular stenosis. Her BP is unequal in her extremities. She previously was seen by neurology but was not candidate  for invasive intervention per patient's husband.  She does tolerate this well except for dry skin and itching, which she did not mention any new concerns related to this at this appointment.    She is followed closely by palliative care and nutrition.  She is scheduled to see palliative care today and nutrition.    She has a lot of financial constraints. She denies any fever or night sweats. She reports she is cold natured and denies changes. She denies any nausea or vomiting unless secondary to taking a certain medication may trigger N/V. She did not have any nausea/vomiting since last being seen***. She reports her baseline dyspnea on exertion which is stable without any chest pain or hemoptysis.  She reports a baseline mild cough. She continues to smoke approximately 2 packs of cigarettes per day. She has intermittent diarrhea alternating with constipation but her bowels have been *** since last being seen. She frequently has hypokalemia on labs.  She cannot tolerate large pills and therefore takes liquid potassium.  She continues to have baseline fatigue and she is overall deconditioned and not very active at home. She is here for evaluation and repeat blood work before undergoing cycle #85.   MEDICAL HISTORY: Past Medical History:  Diagnosis Date   Basal cell carcinoma of cheek    L side of face   Blood type, Rh positive    Cancer (HCC)    Chronic pain syndrome    Depression 08/07/2016   Encounter for antineoplastic chemotherapy 07/17/2016   History of external  beam radiation therapy 11/19/16-12/02/16   brain 25 Gy in 10 fractions   Hyperlipidemia    Hypertension    Hypertension 08/07/2016   Osteoporosis    Small cell lung cancer (HCC) dx'd 05/2016    ALLERGIES:  is allergic to codeine, motrin [ibuprofen], thiazide-type diuretics, and vicodin [hydrocodone-acetaminophen].  MEDICATIONS:  Current Outpatient Medications  Medication Sig Dispense Refill   atorvastatin (LIPITOR) 80 MG tablet  TAKE ONE TABLET BY MOUTH EVERY DAY 30 tablet 11   clopidogrel (PLAVIX) 75 MG tablet TAKE ONE TABLET BY MOUTH EVERY DAY 90 tablet 0   diazepam (VALIUM) 5 MG tablet Take 1 tablet (5 mg total) by mouth every 8 (eight) hours as needed. for anxiety 60 tablet 0   diphenhydrAMINE (BENADRYL) 25 mg capsule Take 25 mg by mouth every 6 (six) hours as needed for allergies.     lidocaine-prilocaine (EMLA) cream Apply 1 Application topically as needed. 30 g 2   ondansetron (ZOFRAN) 8 MG tablet Take 1 tablet (8 mg total) by mouth every 8 (eight) hours as needed for nausea or vomiting. 20 tablet 0   potassium chloride 20 MEQ/15ML (10%) SOLN Take 15 mLs (20 mEq total) by mouth 2 (two) times daily. 300 mL 0   prochlorperazine (COMPAZINE) 10 MG tablet Take 1 tablet (10 mg total) by mouth every 6 (six) hours as needed. 30 tablet 2   traMADol (ULTRAM) 50 MG tablet Take 1 tablet (50 mg total) by mouth every 6 (six) hours as needed. 60 tablet 0   triamcinolone cream (KENALOG) 0.1 % Apply 1 Application topically 2 (two) times daily. 80 g 2   No current facility-administered medications for this visit.    SURGICAL HISTORY:  Past Surgical History:  Procedure Laterality Date   ABDOMINAL HYSTERECTOMY  1981   APPENDECTOMY     at age 26   EYE SURGERY     left   IR GENERIC HISTORICAL  06/03/2016   IR FLUORO GUIDE PORT INSERTION RIGHT 06/03/2016 [facility]   IR GENERIC HISTORICAL  06/03/2016   IR US GUIDE VASC ACCESS RIGHT 06/03/2016 [facility]   SKIN CANCER EXCISION     basal cell carcinoma L side of face    REVIEW OF SYSTEMS:   Review of Systems  Constitutional: Negative for appetite change, chills, fatigue, [name], nosebleeds, sore throat and trouble swallowing.   Eyes: Negative for eye problems and icterus.  Respiratory: Negative for cough, hemoptysis, shortness of breath and wheezing.   Cardiovascular: Negative for chest pain and leg swelling.   Gastrointestinal: Negative for abdominal pain, constipation, diarrhea, nausea and vomiting.  Genitourinary: Negative for bladder incontinence, difficulty urinating, dysuria, frequency and hematuria.   Musculoskeletal: Negative for back pain, gait problem, neck pain and neck stiffness.  Skin: Negative for itching and rash.  Neurological: Negative for dizziness, extremity weakness, gait problem, headaches, [name], depression and sleep disturbance. The patient is not nervous/anxious.     PHYSICAL EXAMINATION:  There were no vitals taken for this visit.  ECOG PERFORMANCE STATUS: {CHL ONC ECOG [id number]  Physical Exam  Constitutional: Oriented to person, place, and time and well-developed, well-nourished, and in no distress. No distress.  HENT:  Head: Normocephalic and atraumatic.  Mouth/Throat: Oropharynx is clear and moist. No oropharyngeal exudate.  Eyes: Conjunctivae are normal. Right eye exhibits no discharge. Left eye exhibits no  discharge. No scleral icterus.  Neck: Normal range of motion. Neck supple.  Cardiovascular: Normal rate, regular rhythm, normal heart sounds and intact distal pulses.   Pulmonary/Chest: Effort normal and breath sounds normal. No respiratory distress. No wheezes. No rales.  Abdominal: Soft. Bowel sounds are normal. Exhibits no distension and no mass. There is no tenderness.  Musculoskeletal: Normal range of motion. Exhibits no edema.  Lymphadenopathy:    No cervical adenopathy.  Neurological: Alert and oriented to person, place, and time. Exhibits normal muscle tone. Gait normal. Coordination normal.  Skin: Skin is warm and dry. No rash noted. Not diaphoretic. No erythema. No pallor.  Psychiatric: Mood, memory and judgment normal.  Vitals reviewed.  LABORATORY DATA: Lab Results  Component Value Date   WBC 6.5  07/29/2023   HGB 11.3 (L) 07/29/2023   HCT 33.5 (L) 07/29/2023   MCV 108.1 (H) 07/29/2023   PLT 247 07/29/2023      Chemistry      Component Value Date/Time   NA 137 07/29/2023 0839   NA 141 08/01/2017 0835   K 3.3 (L) 07/29/2023 0839   K 3.5 08/01/2017 0835   CL 103 07/29/2023 0839   CO2 27 07/29/2023 0839   CO2 25 08/01/2017 0835   BUN 6 (L) 07/29/2023 0839   BUN 8.0 08/01/2017 0835   CREATININE 0.79 07/29/2023 0839   CREATININE 0.9 08/01/2017 0835      Component Value Date/Time   CALCIUM 8.3 (L) 07/29/2023 0839   CALCIUM 9.9 08/01/2017 0835   ALKPHOS 119 07/29/2023 0839   ALKPHOS 142 08/01/2017 0835   AST 20 07/29/2023 0839   AST 15 08/01/2017 0835   ALT 10 07/29/2023 0839   ALT 19 08/01/2017 0835   BILITOT 0.5 07/29/2023 0839   BILITOT 0.35 08/01/2017 0835       RADIOGRAPHIC STUDIES:  No results found.   ASSESSMENT/PLAN:  This is a very pleasant 62 year old Caucasian female with extensive stage small cell lung cancer.  She presented with a right upper lobe lung mass, large right anterior mediastinal and supraclavicular lymphadenopathy as well as a pancreatic and splenic metastasis.  She was diagnosed in September 2017.    The patient underwent systemic chemotherapy with carboplatin and etoposide.  She is status post 6 cycles.  She tolerated treatment well except for chemotherapy-induced anemia which required  pRBCs and platelet transfusions.  She had a significant improvement of her disease with chemotherapy. The patient then underwent prophylactic cranial irradiation. She then underwent stereotactic radiotherapy to the right upper lobe and pulmonary nodule.   She had been on observation for 2 years before showing evidence of disease progression.    She then was started on systemic chemotherapy with carboplatin, etoposide, and Tecentriq. Starting from cycle #5, she has been on maintenance single agent Tecentriq.  She has been tolerating treatment well. She is  status post 84 cycles of Tecentriq total.    Labs were reviewed. Recommend that she proceed with cycle #85 today as schedule.    We will see her back in 3 weeks for evaluation and repeat blood work before considering starting cycle #86.    ***Last scan 10/31.   The patient was advised to call immediately if she has any concerning symptoms in the interval. The patient voices understanding of current disease status and treatment options and is in agreement with the current care plan. All questions were answered. The patient knows to call the clinic with any problems, questions or concerns. We can certainly see  the patient much sooner if necessary   No orders of the defined types were placed in this encounter.    I spent {CHL ONC TIME VISIT - QMVHQ:[id number]} counseling the patient face to face. The total time spent in the appointment was {CHL ONC TIME VISIT - XLKGM:[id number]}.  Mykal Kirchman L Masashi Snowdon, PA-C 08/15/23

## 2023-08-20 ENCOUNTER — Inpatient Hospital Stay (HOSPITAL_BASED_OUTPATIENT_CLINIC_OR_DEPARTMENT_OTHER): Payer: Medicare Other | Admitting: Physician Assistant

## 2023-08-20 ENCOUNTER — Inpatient Hospital Stay: Payer: Medicare Other

## 2023-08-20 ENCOUNTER — Inpatient Hospital Stay (HOSPITAL_BASED_OUTPATIENT_CLINIC_OR_DEPARTMENT_OTHER): Payer: Medicare Other | Admitting: Nurse Practitioner

## 2023-08-20 ENCOUNTER — Inpatient Hospital Stay: Payer: Medicare Other | Admitting: Dietician

## 2023-08-20 ENCOUNTER — Inpatient Hospital Stay: Payer: Medicare Other | Attending: Internal Medicine

## 2023-08-20 ENCOUNTER — Encounter: Payer: Self-pay | Admitting: Nurse Practitioner

## 2023-08-20 VITALS — BP 96/68 | HR 88 | Temp 97.1°F | Resp 17 | Wt 94.4 lb

## 2023-08-20 DIAGNOSIS — C3491 Malignant neoplasm of unspecified part of right bronchus or lung: Secondary | ICD-10-CM | POA: Diagnosis not present

## 2023-08-20 DIAGNOSIS — F419 Anxiety disorder, unspecified: Secondary | ICD-10-CM

## 2023-08-20 DIAGNOSIS — C3411 Malignant neoplasm of upper lobe, right bronchus or lung: Secondary | ICD-10-CM | POA: Diagnosis not present

## 2023-08-20 DIAGNOSIS — E876 Hypokalemia: Secondary | ICD-10-CM | POA: Diagnosis not present

## 2023-08-20 DIAGNOSIS — C7889 Secondary malignant neoplasm of other digestive organs: Secondary | ICD-10-CM | POA: Insufficient documentation

## 2023-08-20 DIAGNOSIS — Z9221 Personal history of antineoplastic chemotherapy: Secondary | ICD-10-CM | POA: Diagnosis not present

## 2023-08-20 DIAGNOSIS — T451X5A Adverse effect of antineoplastic and immunosuppressive drugs, initial encounter: Secondary | ICD-10-CM | POA: Insufficient documentation

## 2023-08-20 DIAGNOSIS — Z515 Encounter for palliative care: Secondary | ICD-10-CM | POA: Diagnosis not present

## 2023-08-20 DIAGNOSIS — Z79899 Other long term (current) drug therapy: Secondary | ICD-10-CM | POA: Diagnosis not present

## 2023-08-20 DIAGNOSIS — G894 Chronic pain syndrome: Secondary | ICD-10-CM | POA: Insufficient documentation

## 2023-08-20 DIAGNOSIS — Z7902 Long term (current) use of antithrombotics/antiplatelets: Secondary | ICD-10-CM | POA: Insufficient documentation

## 2023-08-20 DIAGNOSIS — Z5112 Encounter for antineoplastic immunotherapy: Secondary | ICD-10-CM | POA: Insufficient documentation

## 2023-08-20 DIAGNOSIS — G893 Neoplasm related pain (acute) (chronic): Secondary | ICD-10-CM | POA: Diagnosis not present

## 2023-08-20 DIAGNOSIS — Z85828 Personal history of other malignant neoplasm of skin: Secondary | ICD-10-CM | POA: Diagnosis not present

## 2023-08-20 DIAGNOSIS — R590 Localized enlarged lymph nodes: Secondary | ICD-10-CM | POA: Diagnosis not present

## 2023-08-20 DIAGNOSIS — I10 Essential (primary) hypertension: Secondary | ICD-10-CM | POA: Diagnosis not present

## 2023-08-20 DIAGNOSIS — E785 Hyperlipidemia, unspecified: Secondary | ICD-10-CM | POA: Diagnosis not present

## 2023-08-20 DIAGNOSIS — D6481 Anemia due to antineoplastic chemotherapy: Secondary | ICD-10-CM | POA: Insufficient documentation

## 2023-08-20 DIAGNOSIS — M810 Age-related osteoporosis without current pathological fracture: Secondary | ICD-10-CM | POA: Insufficient documentation

## 2023-08-20 DIAGNOSIS — G4709 Other insomnia: Secondary | ICD-10-CM

## 2023-08-20 LAB — CMP (CANCER CENTER ONLY)
ALT: 15 U/L (ref 0–44)
AST: 24 U/L (ref 15–41)
Albumin: 3.3 g/dL — ABNORMAL LOW (ref 3.5–5.0)
Alkaline Phosphatase: 140 U/L — ABNORMAL HIGH (ref 38–126)
Anion gap: 7 (ref 5–15)
BUN: <5 mg/dL — ABNORMAL LOW (ref 8–23)
CO2: 30 mmol/L (ref 22–32)
Calcium: 8.1 mg/dL — ABNORMAL LOW (ref 8.9–10.3)
Chloride: 101 mmol/L (ref 98–111)
Creatinine: 0.77 mg/dL (ref 0.44–1.00)
GFR, Estimated: >60 mL/min (ref >60)
Glucose, Bld: 120 mg/dL — ABNORMAL HIGH (ref 70–99)
Potassium: 2.8 mmol/L — ABNORMAL LOW (ref 3.5–5.1)
Sodium: 138 mmol/L (ref 135–145)
Total Bilirubin: 0.6 mg/dL (ref <1.2)
Total Protein: 5.7 g/dL — ABNORMAL LOW (ref 6.5–8.1)

## 2023-08-20 LAB — CBC WITH DIFFERENTIAL (CANCER CENTER ONLY)
Abs Immature Granulocytes: 0.02 10*3/uL (ref 0.00–0.07)
Basophils Absolute: 0 10*3/uL (ref 0.0–0.1)
Basophils Relative: 0 %
Eosinophils Absolute: 0.2 10*3/uL (ref 0.0–0.5)
Eosinophils Relative: 2 %
HCT: 33.4 % — ABNORMAL LOW (ref 36.0–46.0)
Hemoglobin: 11.5 g/dL — ABNORMAL LOW (ref 12.0–15.0)
Immature Granulocytes: 0 %
Lymphocytes Relative: 31 %
Lymphs Abs: 2.2 10*3/uL (ref 0.7–4.0)
MCH: 36.9 pg — ABNORMAL HIGH (ref 26.0–34.0)
MCHC: 34.4 g/dL (ref 30.0–36.0)
MCV: 107.1 fL — ABNORMAL HIGH (ref 80.0–100.0)
Monocytes Absolute: 0.4 10*3/uL (ref 0.1–1.0)
Monocytes Relative: 6 %
Neutro Abs: 4.2 10*3/uL (ref 1.7–7.7)
Neutrophils Relative %: 61 %
Platelet Count: 270 10*3/uL (ref 150–400)
RBC: 3.12 MIL/uL — ABNORMAL LOW (ref 3.87–5.11)
RDW: 13.8 % (ref 11.5–15.5)
WBC Count: 7.1 10*3/uL (ref 4.0–10.5)
nRBC: 0 % (ref 0.0–0.2)

## 2023-08-20 MED ORDER — HEPARIN SOD (PORK) LOCK FLUSH 100 UNIT/ML IV SOLN
500.0000 [IU] | Freq: Once | INTRAVENOUS | Status: AC | PRN
  Administered 2023-08-20: 500 [IU]

## 2023-08-20 MED ORDER — SODIUM CHLORIDE 0.9% FLUSH
10.0000 mL | INTRAVENOUS | Status: DC | PRN
  Administered 2023-08-20: 10 mL

## 2023-08-20 MED ORDER — SODIUM CHLORIDE 0.9 % IV SOLN
1200.0000 mg | Freq: Once | INTRAVENOUS | Status: AC
  Administered 2023-08-20: 1200 mg via INTRAVENOUS
  Filled 2023-08-20: qty 20

## 2023-08-20 MED ORDER — SODIUM CHLORIDE 0.9 % IV SOLN
Freq: Once | INTRAVENOUS | Status: AC
Start: 2023-08-20 — End: 2023-08-20

## 2023-08-20 MED ORDER — POTASSIUM CHLORIDE 20 MEQ/15ML (10%) PO SOLN: 20.0000 meq | Freq: Two times a day (BID) | ORAL | 0 refills | Status: DC

## 2023-08-20 NOTE — Progress Notes (Signed)
Nutrition Follow-up:  Patient with extensive stage small cell lung cancer. She is currently receiving maintenance treatment with single agent Tecentriq 1200 mg IV every 3 weeks.    Met with patient and husband in infusion. Patient reports decreased intake. She endorses nausea initiated by smell of food cooking. This is worse the few days after therapy, then improves. Yesterday patient had one taco supreme which she ate for dinner. Husband reports she eats better when he is home. They eat a lot of eggs and bacon for breakfast. Patient reports taking liquid potassium as prescribed. Husband reports this makes her bowels loose. She takes this with Gatorade. Patient unable to afford Ensure/Boost and has not drank these in a while.    Medications: reviewed   Labs: K 2.8, BUN <5, albumin 3.3, alkaline phos 140  Anthropometrics: Wt 94 lb 7 oz today decreased 3% in 3 weeks - significant   11/26 - 97 lb 3.2 oz 11/6 - 96 lb 14.4 oz  10/16 - 97 lb 12.8 oz 9/25 - 98 lb 8 oz   NUTRITION DIAGNOSIS: Unintended wt loss - continues    INTERVENTION:  Encouraged cold/room temperature foods when sensitive to smells Suggested keeping a "snack basket" beside chair when husband is gone during the day - pt and husband like this idea Recommend drinking Ensure Plus/equivalent daily for added calories and protein - samples provided One bag of food from [employer] provided     MONITORING, EVALUATION, GOAL: wt trends, intake    NEXT VISIT: To be scheduled in collaboration with upcoming [facility] appointments

## 2023-08-20 NOTE — Patient Instructions (Signed)
 [facility] - A DE[facility] OF [facility]  Discharge Instructions: Thank you for choosing [facility] to provide your oncology and hematology care.   If you have a lab appointment with the Cancer Center, please go directly to the Cancer Center and check in at the registration area.   Wear comfortable clothing and clothing appropriate for easy access to any Portacath or PICC line.   We strive to give you quality time with your provider. You may need to reschedule your appointment if you arrive late (15 or more minutes).  Arriving late affects you and other patients whose appointments are after yours.  Also, if you miss three or more appointments without notifying the office, you may be dismissed from the clinic at the provider's discretion.      For prescription refill requests, have your pharmacy contact our office and allow 72 hours for refills to be completed.    Today you received the following chemotherapy and/or immunotherapy agents: Tecentriq.       To help prevent nausea and vomiting after your treatment, we encourage you to take your nausea medication as directed.  BELOW ARE SYMPTOMS THAT SHOULD BE REPORTED IMMEDIATELY: *FEVER GREATER THAN 100.4 F (38 C) OR HIGHER *CHILLS OR SWEATING *NAUSEA AND VOMITING THAT IS NOT CONTROLLED WITH YOUR NAUSEA MEDICATION *UNUSUAL SHORTNESS OF BREATH *UNUSUAL BRUISING OR BLEEDING *URINARY PROBLEMS (pain or burning when urinating, or frequent urination) *BOWEL PROBLEMS (unusual diarrhea, constipation, pain near the anus) TENDERNESS IN MOUTH AND THROAT WITH OR WITHOUT PRESENCE OF ULCERS (sore throat, sores in mouth, or a toothache) UNUSUAL RASH, SWELLING OR PAIN  UNUSUAL VAGINAL DISCHARGE OR ITCHING   Items with * indicate a potential emergency and should be followed up as soon as possible or go to the Emergency Department if any problems should occur.  Please show the CHEMOTHERAPY ALERT CARD or  IMMUNOTHERAPY ALERT CARD at check-in to the Emergency Department and triage nurse.  Should you have questions after your visit or need to cancel or reschedule your appointment, please contact [facility]  Dept: [phone]  and follow the prompts.  Office hours are 8:00 a.m. to 4:30 p.m. Monday - Friday. Please note that voicemails left after 4:00 p.m. may not be returned until the following business day.  We are closed weekends and major holidays. You have access to a nurse at all times for urgent questions. Please call the main number to the clinic Dept: [phone] and follow the prompts.   For any non-urgent questions, you may also contact your provider using MyChart. We now offer e-Visits for anyone 21 and older to request care online for non-urgent symptoms. For details visit [email or web address].   Also download the MyChart app! Go to the app store, search "MyChart", open the app, select [place], and log in with your MyChart username and password.

## 2023-08-21 ENCOUNTER — Encounter: Payer: Self-pay | Admitting: Internal Medicine

## 2023-08-21 MED ORDER — DIAZEPAM 5 MG PO TABS: 5.0000 mg | ORAL_TABLET | Freq: Three times a day (TID) | ORAL | 0 refills | Status: DC | PRN

## 2023-09-10 ENCOUNTER — Inpatient Hospital Stay (HOSPITAL_BASED_OUTPATIENT_CLINIC_OR_DEPARTMENT_OTHER): Payer: Medicare Other | Admitting: Nurse Practitioner

## 2023-09-10 ENCOUNTER — Inpatient Hospital Stay: Payer: Medicare Other

## 2023-09-10 ENCOUNTER — Inpatient Hospital Stay: Payer: Medicare Other | Attending: Internal Medicine

## 2023-09-10 ENCOUNTER — Encounter: Payer: Self-pay | Admitting: Nurse Practitioner

## 2023-09-10 VITALS — BP 116/77 | HR 81 | Temp 98.4°F | Resp 17 | Ht 63.0 in | Wt 95.2 lb

## 2023-09-10 DIAGNOSIS — C3411 Malignant neoplasm of upper lobe, right bronchus or lung: Secondary | ICD-10-CM | POA: Insufficient documentation

## 2023-09-10 DIAGNOSIS — R059 Cough, unspecified: Secondary | ICD-10-CM | POA: Diagnosis not present

## 2023-09-10 DIAGNOSIS — C7889 Secondary malignant neoplasm of other digestive organs: Secondary | ICD-10-CM | POA: Diagnosis not present

## 2023-09-10 DIAGNOSIS — R5383 Other fatigue: Secondary | ICD-10-CM

## 2023-09-10 DIAGNOSIS — E785 Hyperlipidemia, unspecified: Secondary | ICD-10-CM | POA: Diagnosis not present

## 2023-09-10 DIAGNOSIS — Z5112 Encounter for antineoplastic immunotherapy: Secondary | ICD-10-CM | POA: Diagnosis present

## 2023-09-10 DIAGNOSIS — Z85828 Personal history of other malignant neoplasm of skin: Secondary | ICD-10-CM | POA: Diagnosis not present

## 2023-09-10 DIAGNOSIS — I10 Essential (primary) hypertension: Secondary | ICD-10-CM | POA: Diagnosis not present

## 2023-09-10 DIAGNOSIS — Z7902 Long term (current) use of antithrombotics/antiplatelets: Secondary | ICD-10-CM | POA: Diagnosis not present

## 2023-09-10 DIAGNOSIS — R197 Diarrhea, unspecified: Secondary | ICD-10-CM | POA: Insufficient documentation

## 2023-09-10 DIAGNOSIS — M810 Age-related osteoporosis without current pathological fracture: Secondary | ICD-10-CM | POA: Insufficient documentation

## 2023-09-10 DIAGNOSIS — K5900 Constipation, unspecified: Secondary | ICD-10-CM | POA: Insufficient documentation

## 2023-09-10 DIAGNOSIS — G894 Chronic pain syndrome: Secondary | ICD-10-CM | POA: Insufficient documentation

## 2023-09-10 DIAGNOSIS — C3491 Malignant neoplasm of unspecified part of right bronchus or lung: Secondary | ICD-10-CM

## 2023-09-10 DIAGNOSIS — C7800 Secondary malignant neoplasm of unspecified lung: Secondary | ICD-10-CM | POA: Diagnosis not present

## 2023-09-10 LAB — CMP (CANCER CENTER ONLY)
ALT: 11 U/L (ref 0–44)
AST: 20 U/L (ref 15–41)
Albumin: 3.3 g/dL — ABNORMAL LOW (ref 3.5–5.0)
Alkaline Phosphatase: 134 U/L — ABNORMAL HIGH (ref 38–126)
Anion gap: 6 (ref 5–15)
BUN: 6 mg/dL — ABNORMAL LOW (ref 8–23)
CO2: 29 mmol/L (ref 22–32)
Calcium: 8.3 mg/dL — ABNORMAL LOW (ref 8.9–10.3)
Chloride: 103 mmol/L (ref 98–111)
Creatinine: 0.79 mg/dL (ref 0.44–1.00)
GFR, Estimated: >60 mL/min (ref >60)
Glucose, Bld: 110 mg/dL — ABNORMAL HIGH (ref 70–99)
Potassium: 3.1 mmol/L — ABNORMAL LOW (ref 3.5–5.1)
Sodium: 138 mmol/L (ref 135–145)
Total Bilirubin: 0.6 mg/dL (ref 0.0–1.2)
Total Protein: 5.9 g/dL — ABNORMAL LOW (ref 6.5–8.1)

## 2023-09-10 LAB — CBC WITH DIFFERENTIAL (CANCER CENTER ONLY)
Abs Immature Granulocytes: 0.01 10*3/uL (ref 0.00–0.07)
Basophils Absolute: 0 10*3/uL (ref 0.0–0.1)
Basophils Relative: 0 %
Eosinophils Absolute: 0.1 10*3/uL (ref 0.0–0.5)
Eosinophils Relative: 2 %
HCT: 30.4 % — ABNORMAL LOW (ref 36.0–46.0)
Hemoglobin: 10.5 g/dL — ABNORMAL LOW (ref 12.0–15.0)
Immature Granulocytes: 0 %
Lymphocytes Relative: 33 %
Lymphs Abs: 1.7 10*3/uL (ref 0.7–4.0)
MCH: 36.3 pg — ABNORMAL HIGH (ref 26.0–34.0)
MCHC: 34.5 g/dL (ref 30.0–36.0)
MCV: 105.2 fL — ABNORMAL HIGH (ref 80.0–100.0)
Monocytes Absolute: 0.2 10*3/uL (ref 0.1–1.0)
Monocytes Relative: 4 %
Neutro Abs: 3.1 10*3/uL (ref 1.7–7.7)
Neutrophils Relative %: 61 %
Platelet Count: 219 10*3/uL (ref 150–400)
RBC: 2.89 MIL/uL — ABNORMAL LOW (ref 3.87–5.11)
RDW: 13.3 % (ref 11.5–15.5)
WBC Count: 5.1 10*3/uL (ref 4.0–10.5)
nRBC: 0 % (ref 0.0–0.2)

## 2023-09-10 LAB — TSH: TSH: 2.689 u[IU]/mL (ref 0.350–4.500)

## 2023-09-10 MED ORDER — SODIUM CHLORIDE 0.9% FLUSH
10.0000 mL | INTRAVENOUS | Status: DC | PRN
Start: 2023-09-10 — End: 2023-09-10
  Administered 2023-09-10: 10 mL

## 2023-09-10 MED ORDER — HEPARIN SOD (PORK) LOCK FLUSH 100 UNIT/ML IV SOLN
500.0000 [IU] | Freq: Once | INTRAVENOUS | Status: AC | PRN
  Administered 2023-09-10: 500 [IU]

## 2023-09-10 MED ORDER — SODIUM CHLORIDE 0.9 % IV SOLN: Freq: Once | INTRAVENOUS | Status: AC

## 2023-09-10 MED ORDER — ATEZOLIZUMAB CHEMO INJECTION 1200 MG/20ML
1200.0000 mg | Freq: Once | INTRAVENOUS | Status: AC
  Administered 2023-09-10: 1200 mg via INTRAVENOUS
  Filled 2023-09-10: qty 20

## 2023-09-10 NOTE — Progress Notes (Addendum)
 Patient Care Team: Pcp, No as PCP - General   CHIEF COMPLAINT: Follow up lung cancer   DIAGNOSIS Extensive stage (T2a, N3, [name], large right anterior mediastinal and supraclavicular lymphadenopathy as well as pancreatic and splenic metastasis diagnosed in September 2017.  The patient had disease progression in October 2019.    PRIOR THERAPY: 1) Systemic chemotherapy was carboplatin  for AUC of 5 on day 1 and etoposide  100 MG/M2 on days 1, 2 and 3 with Neulasta  support. Status post 6 cycles with significant response of her disease. 2) Prophylactic cranial irradiation under the care of Dr. Shannon on 12/02/2016. 3) stereotactic radiotherapy to the recurrent right upper lobe pulmonary nodule under the care of Dr. Shannon completed November 10, 2017. 4) Retreatment with systemic chemotherapy with carboplatin  for AUC of 5 on day 1 and etoposide  100 mg/M2 on days 1, 2 and 3 as well as Tecentriq  (Atezolizumab ) 1200 mg IV every 3 weeks with Neulasta  support.  First dose June 22, 2018 for disease recurrence.  Status post 5 cycles.  Starting from cycle #2 her dose of carboplatin  will be reduced to AUC of 4 and etoposide  80 mg/M2 on days 1, 2 and 3 in addition to the regular dose of Tecentriq .     CURRENT THERAPY: Maintenance treatment with single agent Tecentriq  1200 mg IV every 3 weeks.  Status post 84 cycles.     INTERVAL HISTORY [name] returns for follow up and treatment as scheduled. Last seen by Cassie, PA 08/20/23 with C85 Tecentriq . Tolerating well at baseline. Not pleased with hair loss from cancer treatment. Energy remains non existent but out of bed and up at home. Uses walker, no falls. Morning cough is stable, denies new worsening chest pain or dyspnea. Eating and drinking. Bowels fluctuate from constipation to diarrhea, stable at baseline. Requesting refills of tramadol  (takes 1-2 times daily for back pain, effective) and  valium  (takes 2-3 times per day to take the edge off, effective).    ROS  All other systems reviewed and negative  Past Medical History:  Diagnosis Date   Basal cell carcinoma of cheek    L side of face   Blood type, Rh positive    Cancer (HCC)    Chronic pain syndrome    Depression 08/07/2016   Encounter for antineoplastic chemotherapy 07/17/2016   History of external beam radiation therapy 11/19/16-12/02/16   brain 25 Gy in 10 fractions   Hyperlipidemia    Hypertension    Hypertension 08/07/2016   Osteoporosis    Small cell lung cancer (HCC) dx'd 05/2016     Past Surgical History:  Procedure Laterality Date   ABDOMINAL HYSTERECTOMY  1981   APPENDECTOMY     at age 31   EYE SURGERY     left   IR GENERIC HISTORICAL  06/03/2016   IR FLUORO GUIDE PORT INSERTION RIGHT 06/03/2016 [facility]   IR GENERIC HISTORICAL  06/03/2016   IR US  GUIDE VASC ACCESS RIGHT 06/03/2016 [facility]   SKIN CANCER EXCISION     basal cell carcinoma L side of face     Outpatient Encounter Medications as of 09/10/2023  Medication Sig   atorvastatin  (LIPITOR) 80 MG tablet TAKE ONE TABLET BY MOUTH EVERY DAY   clopidogrel  (PLAVIX ) 75 MG tablet TAKE ONE TABLET BY MOUTH EVERY DAY   diazepam  (VALIUM ) 5 MG tablet Take 1 tablet (5 mg total) by mouth every 8 (eight) hours  as needed. for anxiety   diphenhydrAMINE  (BENADRYL ) 25 mg capsule Take 25 mg by mouth every 6 (six) hours as needed for allergies.   lidocaine -prilocaine  (EMLA ) cream Apply 1 Application topically as needed.   ondansetron  (ZOFRAN ) 8 MG tablet Take 1 tablet (8 mg total) by mouth every 8 (eight) hours as needed for nausea or vomiting.   potassium chloride  20 MEQ/15ML (10%) SOLN Take 15 mLs (20 mEq total) by mouth 2 (two) times daily.   prochlorperazine  (COMPAZINE ) 10 MG tablet Take 1 tablet (10 mg total) by mouth every 6 (six) hours as needed.   traMADol  (ULTRAM ) 50 MG tablet Take 1 tablet (50 mg total) by mouth every 6 (six) hours as needed.    triamcinolone  cream (KENALOG ) 0.1 % Apply 1 Application topically 2 (two) times daily.   Facility-Administered Encounter Medications as of 09/10/2023  Medication   [DISCONTINUED] sodium chloride  flush (NS) 0.9 % injection 10 mL     Today's Vitals   09/10/23 0851 09/10/23 0855  BP: 116/77   Pulse: 81   Resp: 17   Temp: 98.4 F (36.9 C)   TempSrc: Temporal   SpO2: 100%   Weight: 95 lb 3.2 oz (43.2 kg)   Height: 5' 3 (1.6 m)   PainSc:  0-No pain   Body mass index is 16.86 kg/m.   PHYSICAL EXAM GENERAL:alert, no distress and comfortable SKIN: no rash  EYES: sclera clear LUNGS: clear with normal breathing effort HEART: regular rate & rhythm, no lower extremity edema ABDOMEN: abdomen soft, non-tender and normal bowel sounds NEURO: alert & oriented x 3 with fluent speech PAC without erythema    CBC    Component Value Date/Time   WBC 5.1 09/10/2023 0811   WBC 7.2 01/27/2020 1341   RBC 2.89 (L) 09/10/2023 0811   HGB 10.5 (L) 09/10/2023 0811   HGB 12.6 08/01/2017 0836   HCT 30.4 (L) 09/10/2023 0811   HCT 37.8 08/01/2017 0836   PLT 219 09/10/2023 0811   PLT 292 08/01/2017 0836   MCV 105.2 (H) 09/10/2023 0811   MCV 105.6 (H) 08/01/2017 0836   MCH 36.3 (H) 09/10/2023 0811   MCHC 34.5 09/10/2023 0811   RDW 13.3 09/10/2023 0811   RDW 12.6 08/01/2017 0836   LYMPHSABS 1.7 09/10/2023 0811   LYMPHSABS 2.1 08/01/2017 0836   MONOABS 0.2 09/10/2023 0811   MONOABS 0.4 08/01/2017 0836   EOSABS 0.1 09/10/2023 0811   EOSABS 0.1 08/01/2017 0836   BASOSABS 0.0 09/10/2023 0811   BASOSABS 0.0 08/01/2017 0836     CMP     Component Value Date/Time   NA 138 09/10/2023 0811   NA 141 08/01/2017 0835   K 3.1 (L) 09/10/2023 0811   K 3.5 08/01/2017 0835   CL 103 09/10/2023 0811   CO2 29 09/10/2023 0811   CO2 25 08/01/2017 0835   GLUCOSE 110 (H) 09/10/2023 0811   GLUCOSE 103 08/01/2017 0835   BUN 6 (L) 09/10/2023 0811   BUN 8.0 08/01/2017 0835   CREATININE 0.79 09/10/2023 0811    CREATININE 0.9 08/01/2017 0835   CALCIUM  8.3 (L) 09/10/2023 0811   CALCIUM  9.9 08/01/2017 0835   PROT 5.9 (L) 09/10/2023 0811   PROT 7.8 08/01/2017 0835   ALBUMIN 3.3 (L) 09/10/2023 0811   ALBUMIN 3.9 08/01/2017 0835   AST 20 09/10/2023 0811   AST 15 08/01/2017 0835   ALT 11 09/10/2023 0811   ALT 19 08/01/2017 0835   ALKPHOS 134 (H) 09/10/2023 0811   ALKPHOS  142 08/01/2017 0835   BILITOT 0.6 09/10/2023 0811   BILITOT 0.35 08/01/2017 0835   GFRNONAA >60 09/10/2023 0811   GFRAA >60 05/31/2020 0844     ASSESSMENT & PLAN: 63 yo female   Extensive stage (T2a, N3, M1b) small cell lung cancer  -Diagnosed 05/2016  -Presented with right upper lobe lung mass, large right anterior mediastinal and supraclavicular lymphadenopathy as well as pancreatic and splenic metastasis  -Systemic chemotherapy was carboplatin  for AUC of 5 on day 1 and etoposide  100 MG/M2 on days 1, 2 and 3 with Neulasta  support. Status post 6 cycles with significant response of her disease. -Prophylactic cranial irradiation under the care of Dr. Shannon on 12/02/2016. -Disease progression in October 2019.  -stereotactic radiotherapy to the recurrent right upper lobe pulmonary nodule under the care of Dr. Shannon completed November 10, 2017. - Retreatment with systemic chemotherapy with carboplatin  for AUC of 5 on day 1 and etoposide  100 mg/M2 on days 1, 2 and 3 as well as Tecentriq  (Atezolizumab ) 1200 mg IV every 3 weeks with Neulasta  support.  First dose June 22, 2018 for disease recurrence.  Status post 5 cycles.  Starting from cycle #2 her dose of carboplatin  will be reduced to AUC of 4 and etoposide  80 mg/M2 on days 1, 2 and 3 in addition to the regular dose of Tecentriq . -Maintenance treatment with single agent Tecentriq  1200 mg IV every 3 weeks.  Status post 85 cycles.   -Most recent CT CAP 07/03/23 showed stable disease -[name] appears stable. S/p C85 Tecentriq , tolerating with stable fatigue and bowel fluctuation.  SEs are adequately managed with supportive care at home. Able to recover/function with adequate PS.  -Cough is stable, no clinical evidence of disease progression -Labs reviewed, adequate to proceed with C86 Tecentriq  today as scheduled, no dose modifications. -Will request refills from palliative care, next f/up 1/29 -F/up and next cycle in 3 weeks, anticipate restaging in 10/2023     PLAN: -Labs reviewed -Increase oral K to BID -Proceed with C86 Tecentriq  today as scheduled, no dose modifications. -Will request refills from palliative care, next f/up 1/29 -F/up and next cycle in 3 weeks, restaging CT CAP to be ordered at that time    All questions were answered. The patient knows to call the clinic with any problems, questions or concerns. No barriers to learning were detected.   Lonya Johannesen, NP-C 09/10/2023

## 2023-09-10 NOTE — Patient Instructions (Signed)
 [facility] - [facility]  Discharge Instructions: Thank you for choosing [facility] to provide your oncology and hematology care.   If you have a lab appointment with the Cancer Center, please go directly to the Cancer Center and check in at the registration area.   Wear comfortable clothing and clothing appropriate for easy access to any Portacath or PICC line.   We strive to give you quality time with your provider. You may need to reschedule your appointment if you arrive late (15 or more minutes).  Arriving late affects you and other patients whose appointments are after yours.  Also, if you miss three or more appointments without notifying the office, you may be dismissed from the clinic at the provider's discretion.      For prescription refill requests, have your pharmacy contact our office and allow 72 hours for refills to be completed.    Today you received the following chemotherapy and/or immunotherapy agents: Tecentriq      To help prevent nausea and vomiting after your treatment, we encourage you to take your nausea medication as directed.  BELOW ARE SYMPTOMS THAT SHOULD BE REPORTED IMMEDIATELY: *FEVER GREATER THAN 100.4 F (38 C) OR HIGHER *CHILLS OR SWEATING *NAUSEA AND VOMITING THAT IS NOT CONTROLLED WITH YOUR NAUSEA MEDICATION *UNUSUAL SHORTNESS OF BREATH *UNUSUAL BRUISING OR BLEEDING *URINARY PROBLEMS (pain or burning when urinating, or frequent urination) *BOWEL PROBLEMS (unusual diarrhea, constipation, pain near the anus) TENDERNESS IN MOUTH AND THROAT WITH OR WITHOUT PRESENCE OF ULCERS (sore throat, sores in mouth, or a toothache) UNUSUAL RASH, SWELLING OR PAIN  UNUSUAL VAGINAL DISCHARGE OR ITCHING   Items with * indicate a potential emergency and should be followed up as soon as possible or go to the Emergency Department if any problems should occur.  Please show the CHEMOTHERAPY ALERT CARD or IMMUNOTHERAPY  ALERT CARD at check-in to the Emergency Department and triage nurse.  Should you have questions after your visit or need to cancel or reschedule your appointment, please contact [facility]  Dept: [phone]  and follow the prompts.  Office hours are 8:00 a.m. to 4:30 p.m. Monday - Friday. Please note that voicemails left after 4:00 p.m. may not be returned until the following business day.  We are closed weekends and major holidays. You have access to a nurse at all times for urgent questions. Please call the main number to the clinic Dept: [phone] and follow the prompts.   For any non-urgent questions, you may also contact your provider using MyChart. We now offer e-Visits for anyone 62 and older to request care online for non-urgent symptoms. For details visit [email or web address].   Also download the MyChart app! Go to the app store, search "MyChart", open the app, select [place], and log in with your MyChart username and password.

## 2023-09-16 ENCOUNTER — Other Ambulatory Visit: Payer: Self-pay | Admitting: Nurse Practitioner

## 2023-09-16 ENCOUNTER — Telehealth: Payer: Self-pay | Admitting: Medical Oncology

## 2023-09-16 ENCOUNTER — Other Ambulatory Visit: Payer: Self-pay

## 2023-09-16 DIAGNOSIS — C3491 Malignant neoplasm of unspecified part of right bronchus or lung: Secondary | ICD-10-CM

## 2023-09-16 DIAGNOSIS — G893 Neoplasm related pain (acute) (chronic): Secondary | ICD-10-CM

## 2023-09-16 DIAGNOSIS — F419 Anxiety disorder, unspecified: Secondary | ICD-10-CM

## 2023-09-16 MED ORDER — DIAZEPAM 5 MG PO TABS: 5.0000 mg | ORAL_TABLET | Freq: Three times a day (TID) | ORAL | 0 refills | Status: DC | PRN

## 2023-09-16 MED ORDER — TRAMADOL HCL 50 MG PO TABS: 50.0000 mg | ORAL_TABLET | Freq: Four times a day (QID) | ORAL | 0 refills | Status: DC | PRN

## 2023-09-16 NOTE — Telephone Encounter (Signed)
 Requested refill for Valium and tramadol

## 2023-09-24 NOTE — Progress Notes (Signed)
[facility] OFFICE PROGRESS NOTE  Pcp, [name], N3, [name], large right anterior mediastinal and supraclavicular lymphadenopathy as well as pancreatic and splenic metastasis diagnosed in September 2017.  The patient had disease progression in October 2019.   PRIOR THERAPY: 1) ) Systemic chemotherapy was carboplatin for AUC of 5 on day 1 and etoposide 100 MG/M2 on days 1, 2 and 3 with Neulasta support. Status post 6 cycles with significant response of her disease. 2) Prophylactic cranial irradiation under the care of Dr. Roselind Messier on 12/02/2016. 3) stereotactic radiotherapy to the recurrent right upper lobe pulmonary nodule under the care of Dr. Roselind Messier completed November 10, 2017. 4) Retreatment with systemic chemotherapy with carboplatin for AUC of 5 on day 1 and etoposide 100 mg/M2 on days 1, 2 and 3 as well as Tecentriq (Atezolizumab) 1200 mg IV every 3 weeks with Neulasta support.  First dose June 22, 2018 for disease recurrence.  Status post 5 cycles.  Starting from cycle #2 her dose of carboplatin will be reduced to AUC of 4 and etoposide 80 mg/M2 on days 1, 2 and 3 in addition to the regular dose of Tecentriq.  CURRENT THERAPY: Maintenance treatment with single agent Tecentriq 1200 mg IV every 3 weeks.  Status post 86 cycles.    INTERVAL HISTORY: Debra Kidd 63 y.o. female returns to the clinic today for a follow-up visit accompanied by her husband.    The patient is currently being followed for metastatic small cell lung cancer for several years for which she has been on maintenance Tecentriq IV every 3 weeks which is good considering she is in overall poor health due to her other comorbidity such as history of stroke/left sided weakness and significant vascular stenosis. She does tolerate this well except for dry skin and itching. She uses hydrocortisone cream and  benadryl.   She thinks she had a stomach bug about a week ago. She had some nausea/vomiting and chills which resolved after 1-2 days.   She is followed closely by palliative care and nutrition. She is scheduled to see palliative care today. She is down about 1 pound today.   She has a lot of financial constraints. She denies any fever or night sweats. She reports she is cold natured and denies changes. She reports her baseline dyspnea on exertion which is stable without any chest pain or hemoptysis.  She reports a baseline mild cough. She continues to smoke approximately 2 packs of cigarettes per day. She has intermittent diarrhea alternating with constipation.  She frequently has hypokalemia on labs. She cannot tolerate large pills and therefore takes liquid potassium. She continues to have baseline fatigue and she is overall deconditioned and not very active at home. She is here for evaluation and repeat blood work before undergoing cycle #87.     MEDICAL HISTORY: Past Medical History:  Diagnosis Date   Basal cell carcinoma of cheek    L side of face   Blood type, Rh positive    Cancer (HCC)    Chronic pain syndrome    Depression 08/07/2016   Encounter for antineoplastic chemotherapy 07/17/2016   History of external beam radiation therapy 11/19/16-12/02/16   brain 25 Gy in 10 fractions   Hyperlipidemia    Hypertension    Hypertension 08/07/2016   Osteoporosis    Small cell lung cancer (HCC) dx'd 05/2016    ALLERGIES:  is allergic to codeine, motrin [ibuprofen], thiazide-type diuretics, and vicodin [hydrocodone-acetaminophen].  MEDICATIONS:  Current Outpatient Medications  Medication Sig Dispense Refill   atorvastatin (LIPITOR) 80 MG tablet TAKE ONE TABLET BY MOUTH EVERY DAY 30 tablet 11   clopidogrel (PLAVIX) 75 MG tablet TAKE ONE TABLET BY MOUTH EVERY DAY 90 tablet 0   diazepam (VALIUM) 5 MG tablet Take 1 tablet (5 mg total) by mouth every 8 (eight) hours as needed. for anxiety 60  tablet 0   diphenhydrAMINE (BENADRYL) 25 mg capsule Take 25 mg by mouth every 6 (six) hours as needed for allergies.     lidocaine-prilocaine (EMLA) cream Apply 1 Application topically as needed. 30 g 2   ondansetron (ZOFRAN) 8 MG tablet Take 1 tablet (8 mg total) by mouth every 8 (eight) hours as needed for nausea or vomiting. 20 tablet 0   potassium chloride 20 MEQ/15ML (10%) SOLN Take 15 mLs (20 mEq total) by mouth 2 (two) times daily. 300 mL 0   prochlorperazine (COMPAZINE) 10 MG tablet Take 1 tablet (10 mg total) by mouth every 6 (six) hours as needed. 30 tablet 2   traMADol (ULTRAM) 50 MG tablet Take 1 tablet (50 mg total) by mouth every 6 (six) hours as needed. 60 tablet 0   triamcinolone cream (KENALOG) 0.1 % Apply 1 Application topically 2 (two) times daily. 80 g 2   No current facility-administered medications for this visit.    SURGICAL HISTORY:  Past Surgical History:  Procedure Laterality Date   ABDOMINAL HYSTERECTOMY  1981   APPENDECTOMY     at age 57   EYE SURGERY     left   IR GENERIC HISTORICAL  06/03/2016   IR FLUORO GUIDE PORT INSERTION RIGHT 06/03/2016 [facility]   IR GENERIC HISTORICAL  06/03/2016   IR US GUIDE VASC ACCESS RIGHT 06/03/2016 [facility]   SKIN CANCER EXCISION     basal cell carcinoma L side of face    REVIEW OF SYSTEMS:   Constitutional: Positive for stable decreased appetite and fatigue. Negative for chills, fever and unexpected weight change.  HENT: Negative for mouth sores, nosebleeds, sore throat and trouble swallowing.  Eyes: Negative for eye problems and icterus.  Respiratory: Positive for stable SOB and mild cough which are her baseline. Negative for hemoptysis, and wheezing.   Cardiovascular: Negative for chest pain and leg swelling.  Gastrointestinal: She sometimes alternates between diarrhea/constipation. Negative for abdominal pain.  Denies any nausea or vomiting at this time (resolved). Genitourinary: Negative for bladder  incontinence, difficulty urinating, dysuria, frequency and hematuria.   Musculoskeletal: Positive for chronic back pain.  Negative for gait problem, neck pain and neck stiffness.  Skin: Positive for occasional itching. Negative for rash.  Neurological: Positive for generalized weakness.  Positive for left-sided weakness due to her history of stroke.  Negative for extremity weakness, gait problem, [name], depression and sleep disturbance. The patient is not nervous/anxious   PHYSICAL EXAMINATION:  There were no vitals taken for this visit.  ECOG PERFORMANCE STATUS: 2-3  Physical Exam  Constitutional: Oriented to person, place, and time and chronically ill appearing female appears older than stated age and in no distress. HENT: Head: Normocephalic and atraumatic. Mouth/Throat: Oropharynx is clear and moist. No oropharyngeal exudate. Eyes: Conjunctivae are normal. Right eye exhibits no discharge. Left eye exhibits no discharge. No scleral icterus. Neck: Normal range of motion. Neck supple. Cardiovascular: Normal  rate, regular rhythm, murmur noted in right second intercostal space and intact distal pulses.   Pulmonary/Chest: Effort normal.  Quiet breath sounds in all lung fields.  No respiratory distress. No wheezes. No rales. Abdominal: Soft. Bowel sounds are normal. Exhibits no distension and no mass. There is no tenderness.  Musculoskeletal: She has limited range of motion in her left shoulder secondary to pain.   Exhibits no edema.   Lymphadenopathy:    slightly enlarged symmetric left and right upper cervical lymph node.  Neurological: Alert and oriented to person, place, and time. Exhibits muscle wasting. Examined in the wheelchair. Chronic left sided weakness due to hx of CVA.  Skin: Skin is warm and dry. No rash noted. Not diaphoretic. No  erythema. No pallor.  Psychiatric: Mood, memory and judgment normal. Vitals reviewed.  LABORATORY DATA: Lab Results  Component Value Date   WBC 5.1 09/10/2023   HGB 10.5 (L) 09/10/2023   HCT 30.4 (L) 09/10/2023   MCV 105.2 (H) 09/10/2023   PLT 219 09/10/2023      Chemistry      Component Value Date/Time   NA 138 09/10/2023 0811   NA 141 08/01/2017 0835   K 3.1 (L) 09/10/2023 0811   K 3.5 08/01/2017 0835   CL 103 09/10/2023 0811   CO2 29 09/10/2023 0811   CO2 25 08/01/2017 0835   BUN 6 (L) 09/10/2023 0811   BUN 8.0 08/01/2017 0835   CREATININE 0.79 09/10/2023 0811   CREATININE 0.9 08/01/2017 0835      Component Value Date/Time   CALCIUM 8.3 (L) 09/10/2023 0811   CALCIUM 9.9 08/01/2017 0835   ALKPHOS 134 (H) 09/10/2023 0811   ALKPHOS 142 08/01/2017 0835   AST 20 09/10/2023 0811   AST 15 08/01/2017 0835   ALT 11 09/10/2023 0811   ALT 19 08/01/2017 0835   BILITOT 0.6 09/10/2023 0811   BILITOT 0.35 08/01/2017 0835       RADIOGRAPHIC STUDIES:  No results found.   ASSESSMENT/PLAN:  This is a very pleasant 63 year old Caucasian female with extensive stage small cell lung cancer.  She presented with a right upper lobe lung mass, large right anterior mediastinal and supraclavicular lymphadenopathy as well as a pancreatic and splenic metastasis.  She was diagnosed in September 2017.    The patient underwent systemic chemotherapy with carboplatin and etoposide.  She is status post 6 cycles.  She tolerated treatment well except for chemotherapy-induced anemia which required  pRBCs and platelet transfusions.  She had a significant improvement of her disease with chemotherapy. The patient then underwent prophylactic cranial irradiation. She then underwent stereotactic radiotherapy to the right upper lobe and pulmonary nodule.   She had been on observation for 2 years before showing evidence of disease progression.    She then was started on systemic chemotherapy with  carboplatin, etoposide, and Tecentriq. Starting from cycle #5, she has been on maintenance single agent Tecentriq.  She has been tolerating treatment well. She is status post 86 cycles of Tecentriq total.    Labs were reviewed. Recommend that she proceed with cycle #87 today as schedule.   I will arrange for a restaging CT scan of the CAP prior to her next appointment.   We will see her back in 3 weeks for evaluation and repeat blood work before considering starting cycle #88    She will see palliative care today.  Discussed protein supplemental drinks to try today.   The patient was advised to call immediately if she  has any concerning symptoms in the interval. The patient voices understanding of current disease status and treatment options and is in agreement with the current care plan. All questions were answered. The patient knows to call the clinic with any problems, questions or concerns. We can certainly see the patient much sooner if necessary    No orders of the defined types were placed in this encounter.   The total time spent in the appointment was 20-29 minutes  Travon Crochet L Kyrielle Urbanski, PA-C 09/24/23

## 2023-09-25 NOTE — Progress Notes (Signed)
Palliative Medicine [facility]  [phone] Fax:([phone]) [phone]   Name: Debra Kidd Date: 09/25/2023 MRN: [id number]  DOB: 1960/12/22  Patient Care Team: Pcp, No as PCP - General    INTERVAL HISTORY: Debra Kidd is a 63 y.o. 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  Palliative ask to see for symptom management and goals of care.   SOCIAL HISTORY:     reports that she has been smoking cigarettes. She has a 86 pack-year smoking history. She has never used smokeless tobacco. She reports that she does not currently use alcohol. She reports current drug use. Frequency: 7.00 times per week. Drug: Marijuana.  ADVANCE DIRECTIVES:    CODE STATUS:   PAST MEDICAL HISTORY: Past Medical History:  Diagnosis Date   Basal cell carcinoma of cheek    L side of face   Blood type, Rh positive    Cancer (HCC)    Chronic pain syndrome    Depression 08/07/2016   Encounter for antineoplastic chemotherapy 07/17/2016   History of external beam radiation therapy 11/19/16-12/02/16   brain 25 Gy in 10 fractions   Hyperlipidemia    Hypertension    Hypertension 08/07/2016   Osteoporosis    Small cell lung cancer (HCC) dx'd 05/2016    ALLERGIES:  is allergic to codeine, motrin [ibuprofen], thiazide-type diuretics, and vicodin [hydrocodone-acetaminophen].  MEDICATIONS:  Current Outpatient Medications  Medication Sig Dispense Refill   atorvastatin (LIPITOR) 80 MG tablet TAKE ONE TABLET BY MOUTH EVERY DAY 30 tablet 11   clopidogrel (PLAVIX) 75 MG tablet TAKE ONE TABLET BY MOUTH EVERY DAY 90 tablet 0   diazepam (VALIUM) 5 MG tablet Take 1 tablet (5 mg total) by mouth every 8 (eight) hours as needed. for anxiety 60 tablet 0   diphenhydrAMINE (BENADRYL) 25 mg capsule Take 25 mg by  mouth every 6 (six) hours as needed for allergies.     lidocaine-prilocaine (EMLA) cream Apply 1 Application topically as needed. 30 g 2   ondansetron (ZOFRAN) 8 MG tablet Take 1 tablet (8 mg total) by mouth every 8 (eight) hours as needed for nausea or vomiting. 20 tablet 0   potassium chloride 20 MEQ/15ML (10%) SOLN Take 15 mLs (20 mEq total) by mouth 2 (two) times daily. 300 mL 0   prochlorperazine (COMPAZINE) 10 MG tablet Take 1 tablet (10 mg total) by mouth every 6 (six) hours as needed. 30 tablet 2   traMADol (ULTRAM) 50 MG tablet Take 1 tablet (50 mg total) by mouth every 6 (six) hours as needed. 60 tablet 0   triamcinolone cream (KENALOG) 0.1 % Apply 1 Application topically 2 (two) times daily. 80 g 2   No current facility-administered medications for this visit.    VITAL SIGNS: There were no vitals taken for this visit. There were no vitals filed for this visit.  Estimated body mass index is 16.86 kg/m as calculated from the following:   Height as of 09/10/23: 5\' 3"  (1.6 m).   Weight as of 09/10/23: 95 lb 3.2 oz (43.2 kg).   PERFORMANCE STATUS (ECOG) : 1 - Symptomatic but completely ambulatory  Assessment NAD, sitting in recliner Normal breathing pattern RRR AAO x4  Discussed the use of AI scribe software for clinical note transcription with the patient, who gave verbal consent to proceed.  IMPRESSION:  Debra Kidd presents to clinic for symptom management follow-up. No acute distress. Husband presents. She is doing well overall. Denies nausea, vomiting, constipation, or diarrhea. Occasional fatigue.   The patient reports as needed use of Tramadol for her back pain. The medications remain effective, but she requires a refill. She also uses lidocaine patches for pain relief, applying them to her back, including both the lower and upper regions, depending on the pain's location. The pain sometimes affects her ribs and spine. She currently has only two or three patches left and  is concerned about the cost, noting that she purchases them from [place]. Discussed sending in a prescription to see if insurance will assist in cost. Anxiety controlled with use of Valium. No new pain concerns. Wil continue with current regimen, no changes at this time.   She is experiencing short-term memory issues and inquired about the use of Privagen for memory help. No known interactions with current medications or Tecentriq. Advise patient should be ok to start if she chose to.   All questions answered and support provided.  Assessment and Plan  Chronic Cancer Related Pain Reports effective pain control with current regimen of Tramadol and Diazepam. Lidocaine patches also providing relief. -Refill Tramadol 50mg  every 6 hours as needed and Diazepam 5mg  every 8 hours as needed. -Check cost of Lidocaine patches at the clinic's pharmacy for potential cost savings. Will send in prescription to see if insurance will assist in cost.  -No changes to regimen  Memory Loss Reports short-term memory loss. Discussed potential use of Privagen as patient expresses desire to take. -No knonw interactions between Privagen and current treatment regimen.  Follow-up / General Health Maintenance -Next appointment scheduled for October 22, 2023. -Scan to be scheduled by Cassie, PA.  -Return to clinic for labs and treatment as scheduled.  Patient expressed understanding and was in agreement with this plan. She also understands that She can call the clinic at any time with any questions, concerns, or complaints.   Any controlled substances utilized were prescribed in the context of palliative care. PDMP has been reviewed.    Visit consisted of counseling and education dealing with the complex and emotionally intense issues of symptom management and palliative care in the setting of serious and potentially life-threatening illness.  [name], AGPCNP-BC  Palliative Medicine Team/[place]  [facility]

## 2023-10-01 ENCOUNTER — Inpatient Hospital Stay (HOSPITAL_BASED_OUTPATIENT_CLINIC_OR_DEPARTMENT_OTHER): Payer: Medicare Other | Admitting: Physician Assistant

## 2023-10-01 ENCOUNTER — Encounter: Payer: Self-pay | Admitting: Internal Medicine

## 2023-10-01 ENCOUNTER — Inpatient Hospital Stay: Payer: Medicare Other

## 2023-10-01 ENCOUNTER — Inpatient Hospital Stay (HOSPITAL_BASED_OUTPATIENT_CLINIC_OR_DEPARTMENT_OTHER): Payer: Medicare Other | Admitting: Nurse Practitioner

## 2023-10-01 ENCOUNTER — Encounter: Payer: Self-pay | Admitting: Nurse Practitioner

## 2023-10-01 ENCOUNTER — Other Ambulatory Visit (HOSPITAL_COMMUNITY): Payer: Self-pay

## 2023-10-01 VITALS — BP 115/81 | HR 85 | Temp 97.3°F | Resp 16 | Wt 94.7 lb

## 2023-10-01 DIAGNOSIS — C3411 Malignant neoplasm of upper lobe, right bronchus or lung: Secondary | ICD-10-CM

## 2023-10-01 DIAGNOSIS — C3491 Malignant neoplasm of unspecified part of right bronchus or lung: Secondary | ICD-10-CM | POA: Diagnosis not present

## 2023-10-01 DIAGNOSIS — Z515 Encounter for palliative care: Secondary | ICD-10-CM

## 2023-10-01 DIAGNOSIS — G893 Neoplasm related pain (acute) (chronic): Secondary | ICD-10-CM

## 2023-10-01 DIAGNOSIS — F419 Anxiety disorder, unspecified: Secondary | ICD-10-CM

## 2023-10-01 DIAGNOSIS — Z5112 Encounter for antineoplastic immunotherapy: Secondary | ICD-10-CM | POA: Diagnosis not present

## 2023-10-01 DIAGNOSIS — R5383 Other fatigue: Secondary | ICD-10-CM

## 2023-10-01 LAB — CMP (CANCER CENTER ONLY)
ALT: 12 U/L (ref 0–44)
AST: 20 U/L (ref 15–41)
Albumin: 3.6 g/dL (ref 3.5–5.0)
Alkaline Phosphatase: 105 U/L (ref 38–126)
Anion gap: 5 (ref 5–15)
BUN: 5 mg/dL — ABNORMAL LOW (ref 8–23)
CO2: 28 mmol/L (ref 22–32)
Calcium: 8.8 mg/dL — ABNORMAL LOW (ref 8.9–10.3)
Chloride: 106 mmol/L (ref 98–111)
Creatinine: 0.68 mg/dL (ref 0.44–1.00)
GFR, Estimated: >60 mL/min (ref >60)
Glucose, Bld: 103 mg/dL — ABNORMAL HIGH (ref 70–99)
Potassium: 3.7 mmol/L (ref 3.5–5.1)
Sodium: 139 mmol/L (ref 135–145)
Total Bilirubin: 0.6 mg/dL (ref 0.0–1.2)
Total Protein: 6.2 g/dL — ABNORMAL LOW (ref 6.5–8.1)

## 2023-10-01 LAB — TSH: TSH: 2.184 u[IU]/mL (ref 0.350–4.500)

## 2023-10-01 LAB — CBC WITH DIFFERENTIAL (CANCER CENTER ONLY)
Abs Immature Granulocytes: 0.01 10*3/uL (ref 0.00–0.07)
Basophils Absolute: 0 10*3/uL (ref 0.0–0.1)
Basophils Relative: 1 %
Eosinophils Absolute: 0.2 10*3/uL (ref 0.0–0.5)
Eosinophils Relative: 3 %
HCT: 31.9 % — ABNORMAL LOW (ref 36.0–46.0)
Hemoglobin: 10.9 g/dL — ABNORMAL LOW (ref 12.0–15.0)
Immature Granulocytes: 0 %
Lymphocytes Relative: 32 %
Lymphs Abs: 1.9 10*3/uL (ref 0.7–4.0)
MCH: 37.2 pg — ABNORMAL HIGH (ref 26.0–34.0)
MCHC: 34.2 g/dL (ref 30.0–36.0)
MCV: 108.9 fL — ABNORMAL HIGH (ref 80.0–100.0)
Monocytes Absolute: 0.3 10*3/uL (ref 0.1–1.0)
Monocytes Relative: 5 %
Neutro Abs: 3.4 10*3/uL (ref 1.7–7.7)
Neutrophils Relative %: 59 %
Platelet Count: 255 10*3/uL (ref 150–400)
RBC: 2.93 MIL/uL — ABNORMAL LOW (ref 3.87–5.11)
RDW: 14.6 % (ref 11.5–15.5)
WBC Count: 5.8 10*3/uL (ref 4.0–10.5)
nRBC: 0 % (ref 0.0–0.2)

## 2023-10-01 MED ORDER — SODIUM CHLORIDE 0.9 % IV SOLN: Freq: Once | INTRAVENOUS | Status: AC

## 2023-10-01 MED ORDER — SODIUM CHLORIDE 0.9% FLUSH
10.0000 mL | INTRAVENOUS | Status: DC | PRN
Start: 2023-10-01 — End: 2023-10-01
  Administered 2023-10-01: 10 mL

## 2023-10-01 MED ORDER — SODIUM CHLORIDE 0.9% FLUSH
10.0000 mL | INTRAVENOUS | Status: DC | PRN
  Administered 2023-10-01: 10 mL

## 2023-10-01 MED ORDER — LIDOCAINE 5 % EX PTCH
1.0000 | MEDICATED_PATCH | CUTANEOUS | 0 refills | Status: DC
  Filled 2023-10-01: qty 30, 30d supply, fill #0

## 2023-10-01 MED ORDER — SODIUM CHLORIDE 0.9 % IV SOLN
1200.0000 mg | Freq: Once | INTRAVENOUS | Status: AC
  Administered 2023-10-01: 1200 mg via INTRAVENOUS
  Filled 2023-10-01: qty 20

## 2023-10-01 MED ORDER — HEPARIN SOD (PORK) LOCK FLUSH 100 UNIT/ML IV SOLN
500.0000 [IU] | Freq: Once | INTRAVENOUS | Status: AC | PRN
  Administered 2023-10-01: 500 [IU]

## 2023-10-01 NOTE — Patient Instructions (Signed)
[facility] - A DEPT OF [facility]  Discharge Instructions: Thank you for choosing [facility] to provide your oncology and hematology care.   If you have a lab appointment with the Cancer Center, please go directly to the Cancer Center and check in at the registration area.   Wear comfortable clothing and clothing appropriate for easy access to any Portacath or PICC line.   We strive to give you quality time with your provider. You may need to reschedule your appointment if you arrive late (15 or more minutes).  Arriving late affects you and other patients whose appointments are after yours.  Also, if you miss three or more appointments without notifying the office, you may be dismissed from the clinic at the provider's discretion.      For prescription refill requests, have your pharmacy contact our office and allow 72 hours for refills to be completed.    Today you received the following chemotherapy and/or immunotherapy agents :  Atezolizomib      To help prevent nausea and vomiting after your treatment, we encourage you to take your nausea medication as directed.  BELOW ARE SYMPTOMS THAT SHOULD BE REPORTED IMMEDIATELY: *FEVER GREATER THAN 100.4 F (38 C) OR HIGHER *CHILLS OR SWEATING *NAUSEA AND VOMITING THAT IS NOT CONTROLLED WITH YOUR NAUSEA MEDICATION *UNUSUAL SHORTNESS OF BREATH *UNUSUAL BRUISING OR BLEEDING *URINARY PROBLEMS (pain or burning when urinating, or frequent urination) *BOWEL PROBLEMS (unusual diarrhea, constipation, pain near the anus) TENDERNESS IN MOUTH AND THROAT WITH OR WITHOUT PRESENCE OF ULCERS (sore throat, sores in mouth, or a toothache) UNUSUAL RASH, SWELLING OR PAIN  UNUSUAL VAGINAL DISCHARGE OR ITCHING   Items with * indicate a potential emergency and should be followed up as soon as possible or go to the Emergency Department if any problems should occur.  Please show the CHEMOTHERAPY ALERT CARD or  IMMUNOTHERAPY ALERT CARD at check-in to the Emergency Department and triage nurse.  Should you have questions after your visit or need to cancel or reschedule your appointment, please contact [facility] - A DEPT OF [facility]  Dept: [phone]  and follow the prompts.  Office hours are 8:00 a.m. to 4:30 p.m. Monday - Friday. Please note that voicemails left after 4:00 p.m. may not be returned until the following business day.  We are closed weekends and major holidays. You have access to a nurse at all times for urgent questions. Please call the main number to the clinic Dept: [phone] and follow the prompts.   For any non-urgent questions, you may also contact your provider using MyChart. We now offer e-Visits for anyone 32 and older to request care online for non-urgent symptoms. For details visit [email or web address].   Also download the MyChart app! Go to the app store, search "MyChart", open the app, select [place], and log in with your MyChart username and password.

## 2023-10-02 ENCOUNTER — Other Ambulatory Visit: Payer: Self-pay | Admitting: Adult Health

## 2023-10-15 ENCOUNTER — Other Ambulatory Visit (HOSPITAL_COMMUNITY): Payer: Self-pay

## 2023-10-15 NOTE — Progress Notes (Unsigned)
Debra Kidd OFFICE PROGRESS NOTE  Pcp, [name], N3, [name], large right anterior mediastinal and supraclavicular lymphadenopathy as well as pancreatic and splenic metastasis diagnosed in September 2017.  The patient had disease progression in October 2019.   PRIOR THERAPY: 1) Systemic chemotherapy was carboplatin for AUC of 5 on day 1 and etoposide 100 MG/M2 on days 1, 2 and 3 with Neulasta support. Status post 6 cycles with significant response of her disease. 2) Prophylactic cranial irradiation under the care of Dr. Roselind Messier on 12/02/2016. 3) stereotactic radiotherapy to the recurrent right upper lobe pulmonary nodule under the care of Dr. Roselind Messier completed November 10, 2017. 4) Retreatment with systemic chemotherapy with carboplatin for AUC of 5 on day 1 and etoposide 100 mg/M2 on days 1, 2 and 3 as well as Tecentriq (Atezolizumab) 1200 mg IV every 3 weeks with Neulasta support.  First dose June 22, 2018 for disease recurrence.  Status post 5 cycles.  Starting from cycle #2 her dose of carboplatin will be reduced to AUC of 4 and etoposide 80 mg/M2 on days 1, 2 and 3 in addition to the regular dose of Tecentriq.  CURRENT THERAPY: Maintenance treatment with single agent Tecentriq 1200 mg IV every 3 weeks.  Status post 87 cycles.   INTERVAL HISTORY: Debra Kidd 63 y.o. female returns to the clinic today for a follow-up visit accompanied by her husband.    The patient is currently being followed for metastatic small cell lung cancer for several years for which she has been on maintenance Tecentriq IV every 3 weeks which is good considering she is in overall poor health due to her other comorbidity such as history of stroke/left sided weakness and significant vascular stenosis. She does tolerate this well except for dry skin and itching. She uses hydrocortisone cream and  benadryl.   She is followed closely by palliative care and nutrition. She is scheduled to see palliative care today. She is down about *** pound today.   She has a lot of financial constraints. She denies any fever or night sweats. She reports she is cold natured and denies changes. She reports her baseline dyspnea on exertion which is stable without any chest pain or hemoptysis.  She reports a baseline mild cough. She continues to smoke approximately 2 packs of cigarettes per day. She has intermittent diarrhea alternating with constipation.  She frequently has hypokalemia on labs. She cannot tolerate large pills and therefore takes liquid potassium. She continues to have baseline fatigue and she is overall deconditioned and not very active at home. She recently had a restaging CT scan. She is here for evaluation and repeat blood work before undergoing cycle #88.     MEDICAL HISTORY: Past Medical History:  Diagnosis Date   Basal cell carcinoma of cheek    L side of face   Blood type, Rh positive    Cancer (HCC)    Chronic pain syndrome    Depression 08/07/2016   Encounter for antineoplastic chemotherapy 07/17/2016   History of external beam radiation therapy 11/19/16-12/02/16   brain 25 Gy in 10 fractions   Hyperlipidemia    Hypertension    Hypertension 08/07/2016   Osteoporosis    Small cell lung cancer (HCC) dx'd 05/2016    ALLERGIES:  is allergic to codeine, motrin [ibuprofen], thiazide-type diuretics, and vicodin [hydrocodone-acetaminophen].  MEDICATIONS:  Current Outpatient Medications  Medication Sig Dispense Refill   atorvastatin (LIPITOR) 80 MG tablet TAKE ONE TABLET BY MOUTH EVERY DAY 30 tablet 11   clopidogrel (PLAVIX) 75 MG tablet TAKE ONE TABLET BY MOUTH EVERY DAY 90 tablet 0   diazepam (VALIUM) 5 MG tablet Take 1 tablet (5 mg total) by mouth every 8 (eight) hours as needed. for anxiety 60 tablet 0   diphenhydrAMINE (BENADRYL) 25 mg capsule Take 25 mg by mouth every 6 (six)  hours as needed for allergies.     lidocaine (LIDODERM) 5 % Place 1 patch onto the skin daily. Remove & Discard patch within 12 hours or as directed by MD 30 patch 0   lidocaine-prilocaine (EMLA) cream Apply 1 Application topically as needed. 30 g 2   ondansetron (ZOFRAN) 8 MG tablet Take 1 tablet (8 mg total) by mouth every 8 (eight) hours as needed for nausea or vomiting. 20 tablet 0   potassium chloride 20 MEQ/15ML (10%) SOLN Take 15 mLs (20 mEq total) by mouth 2 (two) times daily. 300 mL 0   prochlorperazine (COMPAZINE) 10 MG tablet Take 1 tablet (10 mg total) by mouth every 6 (six) hours as needed. 30 tablet 2   traMADol (ULTRAM) 50 MG tablet Take 1 tablet (50 mg total) by mouth every 6 (six) hours as needed. 60 tablet 0   triamcinolone cream (KENALOG) 0.1 % Apply 1 Application topically 2 (two) times daily. 80 g 2   No current facility-administered medications for this visit.    SURGICAL HISTORY:  Past Surgical History:  Procedure Laterality Date   ABDOMINAL HYSTERECTOMY  1981   APPENDECTOMY     at age 60   EYE SURGERY     left   IR GENERIC HISTORICAL  06/03/2016   IR FLUORO GUIDE PORT INSERTION RIGHT 06/03/2016 [facility]   IR GENERIC HISTORICAL  06/03/2016   IR US GUIDE VASC ACCESS RIGHT 06/03/2016 [facility]   SKIN CANCER EXCISION     basal cell carcinoma L side of face    REVIEW OF SYSTEMS:   Review of Systems  Constitutional: Negative for appetite change, chills, fatigue, [name], nosebleeds, sore throat and trouble swallowing.   Eyes: Negative for eye problems and icterus.  Respiratory: Negative for cough, hemoptysis, [name], constipation, diarrhea, nausea and vomiting.  Genitourinary: Negative for bladder incontinence, difficulty urinating, dysuria, frequency and hematuria.    Musculoskeletal: Negative for back pain, gait problem, neck pain and neck stiffness.  Skin: Negative for itching and rash.  Neurological: Negative for dizziness, extremity weakness, gait problem, headaches, [name], depression and sleep disturbance. The patient is not nervous/anxious.     PHYSICAL EXAMINATION:  There were no vitals taken for this visit.  ECOG PERFORMANCE STATUS: {CHL ONC ECOG [id number]  Physical Exam  Constitutional: Oriented to person, place, and time and well-developed, well-nourished, and in no distress. No distress.  HENT:  Head: Normocephalic and atraumatic.  Mouth/Throat: Oropharynx is clear and moist. No oropharyngeal exudate.  Eyes: Conjunctivae are normal. Right eye exhibits no discharge. Left eye exhibits no discharge. No scleral icterus.  Neck: Normal range of motion. Neck supple.  Cardiovascular: Normal rate, regular rhythm, normal heart sounds and intact distal pulses.   Pulmonary/Chest: Effort normal and breath  sounds normal. No respiratory distress. No wheezes. No rales.  Abdominal: Soft. Bowel sounds are normal. Exhibits no distension and no mass. There is no tenderness.  Musculoskeletal: Normal range of motion. Exhibits no edema.  Lymphadenopathy:    No cervical adenopathy.  Neurological: Alert and oriented to person, place, and time. Exhibits normal muscle tone. Gait normal. Coordination normal.  Skin: Skin is warm and dry. No rash noted. Not diaphoretic. No erythema. No pallor.  Psychiatric: Mood, memory and judgment normal.  Vitals reviewed.  LABORATORY DATA: Lab Results  Component Value Date   WBC 5.8 10/01/2023   HGB 10.9 (L) 10/01/2023   HCT 31.9 (L) 10/01/2023   MCV 108.9 (H) 10/01/2023   PLT 255 10/01/2023      Chemistry      Component Value Date/Time   NA 139 10/01/2023 0925   NA 141  08/01/2017 0835   K 3.7 10/01/2023 0925   K 3.5 08/01/2017 0835   CL 106 10/01/2023 0925   CO2 28 10/01/2023 0925   CO2 25 08/01/2017 0835   BUN 5 (L) 10/01/2023 0925   BUN 8.0 08/01/2017 0835   CREATININE 0.68 10/01/2023 0925   CREATININE 0.9 08/01/2017 0835      Component Value Date/Time   CALCIUM 8.8 (L) 10/01/2023 0925   CALCIUM 9.9 08/01/2017 0835   ALKPHOS 105 10/01/2023 0925   ALKPHOS 142 08/01/2017 0835   AST 20 10/01/2023 0925   AST 15 08/01/2017 0835   ALT 12 10/01/2023 0925   ALT 19 08/01/2017 0835   BILITOT 0.6 10/01/2023 0925   BILITOT 0.35 08/01/2017 0835       RADIOGRAPHIC STUDIES:  No results found.   ASSESSMENT/PLAN:  This is a very pleasant 63 year old Caucasian female with extensive stage small cell lung cancer.  She presented with a right upper lobe lung mass, large right anterior mediastinal and supraclavicular lymphadenopathy as well as a pancreatic and splenic metastasis.  She was diagnosed in September 2017.    The patient underwent systemic chemotherapy with carboplatin and etoposide.  She is status post 6 cycles.  She tolerated treatment well except for chemotherapy-induced anemia which required  pRBCs and platelet transfusions.  She had a significant improvement of her disease with chemotherapy. The patient then underwent prophylactic cranial irradiation. She then underwent stereotactic radiotherapy to the right upper lobe and pulmonary nodule.  She had been on observation for 2 years before showing evidence of disease progression.    She then was started on systemic chemotherapy with carboplatin, etoposide, and Tecentriq. Starting from cycle #5, she has been on maintenance single agent Tecentriq.  She has been tolerating treatment well. She is status post 87 cycles of Tecentriq total.   The patient was seen with Dr. Arbutus Ped today.  Dr. Arbutus Ped personally and independently reviewed the scan and discussed results with the patient today.  The scan  showed ***.  Dr. Arbutus Ped recommends ***  Labs were reviewed. Recommend that she proceed with cycle #88 today as schedule.  We will see her back in 3 weeks for evaluation and repeat blood work before considering starting cycle #89    She will see palliative care today.  The patient was advised to call immediately if she has any concerning symptoms in the interval. The patient voices understanding of current disease status and treatment options and is in agreement with the current care plan. All questions were answered. The patient knows to call the clinic with any problems, questions or concerns. We can certainly see  the patient much sooner if necessary    No orders of the defined types were placed in this encounter.    I spent {CHL ONC TIME VISIT - WUJWJ:[id number]} counseling the patient face to face. The total time spent in the appointment was {CHL ONC TIME VISIT - OZHYQ:[id number]}.  Harveen Flesch L Sterling Ucci, PA-C 10/15/23

## 2023-10-16 ENCOUNTER — Ambulatory Visit (HOSPITAL_COMMUNITY)
Admission: RE | Admit: 2023-10-16 | Discharge: 2023-10-16 | Disposition: A | Discharge status: Home / Self Care | Payer: Medicare Other | Source: Ambulatory Visit | Attending: Physician Assistant | Admitting: Physician Assistant

## 2023-10-16 DIAGNOSIS — C3491 Malignant neoplasm of unspecified part of right bronchus or lung: Secondary | ICD-10-CM | POA: Insufficient documentation

## 2023-10-16 MED ORDER — IOHEXOL 300 MG/ML  SOLN
30.0000 mL | Freq: Once | INTRAMUSCULAR | Status: AC | PRN
Start: 2023-10-16 — End: 2023-10-16
  Administered 2023-10-16: 30 mL via ORAL

## 2023-10-16 MED ORDER — IOHEXOL 300 MG/ML  SOLN
80.0000 mL | Freq: Once | INTRAMUSCULAR | Status: AC | PRN
  Administered 2023-10-16: 80 mL via INTRAVENOUS

## 2023-10-16 MED ORDER — HEPARIN SOD (PORK) LOCK FLUSH 100 UNIT/ML IV SOLN
500.0000 [IU] | Freq: Once | INTRAVENOUS | Status: AC
  Administered 2023-10-16: 500 [IU] via INTRAVENOUS

## 2023-10-17 NOTE — Progress Notes (Deleted)
 Palliative Medicine [facility]  [phone] [phone]   Name: Debra Kidd Date: 10/17/2023 MRN: [id number]  DOB: Mar 10, 1961  Patient Care Team: Pcp, No as PCP - General    INTERVAL HISTORY: ANDRETTA Kidd is a 63 y.o. 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  Palliative ask to see for symptom management and goals of care.   SOCIAL HISTORY:     reports that she has been smoking cigarettes. She has a 86 pack-year smoking history. She has never used smokeless tobacco. She reports that she does not currently use alcohol. She reports current drug use. Frequency: 7.00 times per week. Drug: Marijuana.  ADVANCE DIRECTIVES:    CODE STATUS:   PAST MEDICAL HISTORY: Past Medical History:  Diagnosis Date   Basal cell carcinoma of cheek    L side of face   Blood type, Rh positive    Cancer (HCC)    Chronic pain syndrome    Depression 08/07/2016   Encounter for antineoplastic chemotherapy 07/17/2016   History of external beam radiation therapy 11/19/16-12/02/16   brain 25 Gy in 10 fractions   Hyperlipidemia    Hypertension    Hypertension 08/07/2016   Osteoporosis    Small cell lung cancer (HCC) dx'd 05/2016    ALLERGIES:  is allergic to codeine, motrin [ibuprofen], thiazide-type diuretics, and vicodin [hydrocodone-acetaminophen].  MEDICATIONS:  Current Outpatient Medications  Medication Sig Dispense Refill   atorvastatin (LIPITOR) 80 MG tablet TAKE ONE TABLET BY MOUTH EVERY DAY 30 tablet 11   clopidogrel (PLAVIX) 75 MG tablet TAKE ONE TABLET BY MOUTH EVERY DAY 90 tablet 0   diazepam (VALIUM) 5 MG tablet Take 1 tablet (5 mg total) by mouth every 8 (eight) hours as needed. for anxiety 60 tablet 0   diphenhydrAMINE (BENADRYL) 25 mg capsule Take 25 mg by  mouth every 6 (six) hours as needed for allergies.     lidocaine (LIDODERM) 5 % Place 1 patch onto the skin daily. Remove & Discard patch within 12 hours or as directed by MD 30 patch 0   lidocaine-prilocaine (EMLA) cream Apply 1 Application topically as needed. 30 g 2   ondansetron (ZOFRAN) 8 MG tablet Take 1 tablet (8 mg total) by mouth every 8 (eight) hours as needed for nausea or vomiting. 20 tablet 0   potassium chloride 20 MEQ/15ML (10%) SOLN Take 15 mLs (20 mEq total) by mouth 2 (two) times daily. 300 mL 0   prochlorperazine (COMPAZINE) 10 MG tablet Take 1 tablet (10 mg total) by mouth every 6 (six) hours as needed. 30 tablet 2   traMADol (ULTRAM) 50 MG tablet Take 1 tablet (50 mg total) by mouth every 6 (six) hours as needed. 60 tablet 0   triamcinolone cream (KENALOG) 0.1 % Apply 1 Application topically 2 (two) times daily. 80 g 2   No current facility-administered medications for this visit.    VITAL SIGNS: There were no vitals taken for this visit. There were no vitals filed for this visit.  Estimated body mass index is 16.78 kg/m as calculated from the following:   Height as of 09/10/23: 5\' 3"  (1.6 m).   Weight as of 10/01/23: 94 lb 11.2 oz (43 kg).   PERFORMANCE STATUS (ECOG) : 1 - Symptomatic but completely ambulatory  Assessment NAD, sitting in recliner  Normal breathing pattern RRR AAO x4  Discussed the use of AI scribe software for clinical note transcription with the patient, who gave verbal consent to proceed.   IMPRESSION:  Debra Kidd presents to clinic for symptom management follow-up. No acute distress. Husband presents. She is doing well overall. Denies nausea, vomiting, constipation, or diarrhea. Occasional fatigue.   The patient reports as needed use of Tramadol for her back pain. The medications remain effective, but she requires a refill. She also uses lidocaine patches for pain relief, applying them to her back, including both the lower and upper regions,  depending on the pain's location. The pain sometimes affects her ribs and spine. She currently has only two or three patches left and is concerned about the cost, noting that she purchases them from [facility]. Discussed sending in a prescription to see if insurance will assist in cost. Anxiety controlled with use of Valium. No new pain concerns. Wil continue with current regimen, no changes at this time.   She is experiencing short-term memory issues and inquired about the use of Privagen for memory help. No known interactions with current medications or Tecentriq. Advise patient should be ok to start if she chose to.   All questions answered and support provided.  Assessment and Plan  Chronic Cancer Related Pain Reports effective pain control with current regimen of Tramadol and Diazepam. Lidocaine patches also providing relief. -Refill Tramadol 50mg  every 6 hours as needed and Diazepam 5mg  every 8 hours as needed. -Check cost of Lidocaine patches at the clinic's pharmacy for potential cost savings. Will send in prescription to see if insurance will assist in cost.  -No changes to regimen  Memory Loss Reports short-term memory loss. Discussed potential use of Privagen as patient expresses desire to take. -No knonw interactions between Privagen and current treatment regimen.  Follow-up / General Health Maintenance -Next appointment scheduled for October 22, 2023. -Scan to be scheduled by Cassie, PA.  -Return to clinic for labs and treatment as scheduled.  Patient expressed understanding and was in agreement with this plan. She also understands that She can call the clinic at any time with any questions, concerns, or complaints.   Any controlled substances utilized were prescribed in the context of palliative care. PDMP has been reviewed.    Visit consisted of counseling and education dealing with the complex and emotionally intense issues of symptom management and palliative care in the  setting of serious and potentially life-threatening illness.  Debra Kidd, AGPCNP-BC  Palliative Medicine Team/[facility]

## 2023-10-20 ENCOUNTER — Telehealth: Payer: Self-pay | Admitting: Medical Oncology

## 2023-10-20 NOTE — Telephone Encounter (Signed)
Cancelled her appt for wed due to weather forecast and her road to her house is not good anyway. Schedule message sent.

## 2023-10-22 ENCOUNTER — Inpatient Hospital Stay: Payer: Medicare Other | Admitting: Dietician

## 2023-10-22 ENCOUNTER — Inpatient Hospital Stay: Payer: Medicare Other

## 2023-10-22 ENCOUNTER — Inpatient Hospital Stay: Payer: Medicare Other | Admitting: Physician Assistant

## 2023-10-22 NOTE — Progress Notes (Signed)
 [facility] OFFICE PROGRESS NOTE  Pcp, [name], N3, [name], large right anterior mediastinal and supraclavicular lymphadenopathy as well as pancreatic and splenic metastasis diagnosed in September 2017.  The patient had disease progression in October 2019.   PRIOR THERAPY: 1) Systemic chemotherapy was carboplatin for AUC of 5 on day 1 and etoposide 100 MG/M2 on days 1, 2 and 3 with Neulasta support. Status post 6 cycles with significant response of her disease. 2) Prophylactic cranial irradiation under the care of Dr. Roselind Messier on 12/02/2016. 3) stereotactic radiotherapy to the recurrent right upper lobe pulmonary nodule under the care of Dr. Roselind Messier completed November 10, 2017. 4) Retreatment with systemic chemotherapy with carboplatin for AUC of 5 on day 1 and etoposide 100 mg/M2 on days 1, 2 and 3 as well as Tecentriq (Atezolizumab) 1200 mg IV every 3 weeks with Neulasta support.  First dose June 22, 2018 for disease recurrence.  Status post 5 cycles.  Starting from cycle #2 her dose of carboplatin will be reduced to AUC of 4 and etoposide 80 mg/M2 on days 1, 2 and 3 in addition to the regular dose of Tecentriq.  CURRENT THERAPY: Maintenance treatment with single agent Tecentriq 1200 mg IV every 3 weeks.  Status post 87 cycles.   INTERVAL HISTORY: Debra Kidd 63 y.o. female returns to the clinic today for a follow-up visit accompanied by her husband.  I have known the patient for 5 years and today is the first day that she has walked into the clinic with a walker as opposed to in a wheelchair.  She is very frail and deconditioned at baseline but she states that she is determined to "keep her legs".   The patient is currently being followed for metastatic small cell lung cancer for several years for which she has been on maintenance Tecentriq IV every 3 weeks which is good  considering she is in overall poor health due to her other comorbidity such as history of stroke/left sided weakness and significant vascular stenosis. She does tolerate this well except for dry skin and itching. She uses hydrocortisone cream and benadryl.   She is followed closely by palliative care and nutrition.  She missed her appointment with palliative care.  She is scheduled to see nutrition at her next appointment.  She states she needs a refill of her tramadol and diazepam.   She has a lot of financial constraints. She denies any fever or night sweats. She reports she is cold natured and denies changes. She reports her baseline dyspnea on exertion which is stable without any chest pain or hemoptysis.  She reports a baseline mild cough. She continues to smoke approximately 2 packs of cigarettes per day. She has intermittent diarrhea alternating with constipation.  She frequently has hypokalemia on labs. She cannot tolerate large pills and therefore takes liquid potassium nausea a while back. She may have nausea due to smell aversions to certain foods. She continues to have baseline fatigue and she is overall deconditioned and not very active at home. She recently had a restaging CT scan. She is here for evaluation and repeat blood work before undergoing cycle #88.     MEDICAL HISTORY: Past Medical History:  Diagnosis Date   Basal cell carcinoma of cheek    L side of face   Blood type, Rh positive    Cancer (HCC)  Chronic pain syndrome    Depression 08/07/2016   Encounter for antineoplastic chemotherapy 07/17/2016   History of external beam radiation therapy 11/19/16-12/02/16   brain 25 Gy in 10 fractions   Hyperlipidemia    Hypertension    Hypertension 08/07/2016   Osteoporosis    Small cell lung cancer (HCC) dx'd 05/2016    ALLERGIES:  is allergic to codeine, motrin [ibuprofen], thiazide-type diuretics, and vicodin [hydrocodone-acetaminophen].  MEDICATIONS:  Current Outpatient  Medications  Medication Sig Dispense Refill   atorvastatin (LIPITOR) 80 MG tablet TAKE ONE TABLET BY MOUTH EVERY DAY 30 tablet 11   clopidogrel (PLAVIX) 75 MG tablet TAKE ONE TABLET BY MOUTH EVERY DAY 90 tablet 0   diazepam (VALIUM) 5 MG tablet Take 1 tablet (5 mg total) by mouth every 8 (eight) hours as needed. for anxiety 60 tablet 0   diphenhydrAMINE (BENADRYL) 25 mg capsule Take 25 mg by mouth every 6 (six) hours as needed for allergies.     lidocaine (LIDODERM) 5 % Place 1 patch onto the skin daily. Remove & Discard patch within 12 hours or as directed by MD 30 patch 0   lidocaine-prilocaine (EMLA) cream Apply 1 Application topically as needed. 30 g 2   ondansetron (ZOFRAN) 8 MG tablet Take 1 tablet (8 mg total) by mouth every 8 (eight) hours as needed for nausea or vomiting. 20 tablet 0   potassium chloride 20 MEQ/15ML (10%) SOLN Take 15 mLs (20 mEq total) by mouth 2 (two) times daily. 300 mL 0   prochlorperazine (COMPAZINE) 10 MG tablet Take 1 tablet (10 mg total) by mouth every 6 (six) hours as needed. 30 tablet 2   traMADol (ULTRAM) 50 MG tablet Take 1 tablet (50 mg total) by mouth every 6 (six) hours as needed. 60 tablet 0   triamcinolone cream (KENALOG) 0.1 % Apply 1 Application topically 2 (two) times daily. 80 g 2   No current facility-administered medications for this visit.    SURGICAL HISTORY:  Past Surgical History:  Procedure Laterality Date   ABDOMINAL HYSTERECTOMY  1981   APPENDECTOMY     at age 28   EYE SURGERY     left   IR GENERIC HISTORICAL  06/03/2016   IR FLUORO GUIDE PORT INSERTION RIGHT 06/03/2016 [facility]   IR GENERIC HISTORICAL  06/03/2016   IR US GUIDE VASC ACCESS RIGHT 06/03/2016 [facility]   SKIN CANCER EXCISION     basal cell carcinoma L side of face    REVIEW OF SYSTEMS:   Constitutional: Positive for stable decreased appetite and fatigue. Negative for chills, fever and unexpected weight change.  HENT: Negative for mouth sores, nosebleeds,  sore throat and trouble swallowing.  Eyes: Negative for eye problems and icterus.  Respiratory: Positive for stable SOB and mild cough which are her baseline. Negative for hemoptysis, and wheezing.   Cardiovascular: Negative for chest pain and leg swelling.  Gastrointestinal: She sometimes alternates between diarrhea/constipation. Negative for abdominal pain.  Denies any nausea or vomiting at this time. Genitourinary: Negative for bladder incontinence, difficulty urinating, dysuria, frequency and hematuria.   Musculoskeletal: Positive for chronic back pain.  Negative for gait problem, neck pain and neck stiffness.  Skin: Positive for occasional itching. Negative for rash.  Neurological: Positive for generalized weakness.  Positive for left-sided weakness due to her history of stroke.  Negative for extremity weakness, gait problem, [name], depression and sleep  disturbance. The patient is not nervous/anxious    PHYSICAL EXAMINATION:  Blood pressure 129/79, pulse 98, temperature 97.6 F (36.4 C), temperature source Temporal, resp. rate 16, weight 94 lb 4.8 oz (42.8 kg), SpO2 100%.  ECOG PERFORMANCE STATUS: 2-3  Physical Exam  Constitutional: Oriented to person, place, and time and chronically ill appearing female appears older than stated age and in no distress. HENT: Head: Normocephalic and atraumatic. Mouth/Throat: Oropharynx is clear and moist. No oropharyngeal exudate. Eyes: Conjunctivae are normal. Right eye exhibits no discharge. Left eye exhibits no discharge. No scleral icterus. Neck: Normal range of motion. Neck supple. Cardiovascular: Normal rate, regular rhythm, murmur noted in right second intercostal space and intact distal pulses.   Pulmonary/Chest: Effort normal.  Quiet breath sounds in all lung fields.  No respiratory distress. No  wheezes. No rales. Abdominal: Soft. Bowel sounds are normal. Exhibits no distension and no mass. There is no tenderness.  Musculoskeletal: She has limited range of motion in her left shoulder secondary to pain.   Exhibits no edema.   Lymphadenopathy:    slightly enlarged symmetric left and right upper cervical lymph node.  Neurological: Alert and oriented to person, place, and time. Exhibits muscle wasting. Chronic left sided weakness due to hx of CVA. Ambulated with a walker today. Skin: Skin is warm and dry. No rash noted. Not diaphoretic. No erythema. No pallor.  Psychiatric: Mood, memory and judgment normal. Vitals reviewed.  LABORATORY DATA: Lab Results  Component Value Date   WBC 6.0 10/28/2023   HGB 12.0 10/28/2023   HCT 35.3 (L) 10/28/2023   MCV 110.0 (H) 10/28/2023   PLT 259 10/28/2023      Chemistry      Component Value Date/Time   NA 139 10/01/2023 0925   NA 141 08/01/2017 0835   K 3.7 10/01/2023 0925   K 3.5 08/01/2017 0835   CL 106 10/01/2023 0925   CO2 28 10/01/2023 0925   CO2 25 08/01/2017 0835   BUN 5 (L) 10/01/2023 0925   BUN 8.0 08/01/2017 0835   CREATININE 0.68 10/01/2023 0925   CREATININE 0.9 08/01/2017 0835      Component Value Date/Time   CALCIUM 8.8 (L) 10/01/2023 0925   CALCIUM 9.9 08/01/2017 0835   ALKPHOS 105 10/01/2023 0925   ALKPHOS 142 08/01/2017 0835   AST 20 10/01/2023 0925   AST 15 08/01/2017 0835   ALT 12 10/01/2023 0925   ALT 19 08/01/2017 0835   BILITOT 0.6 10/01/2023 0925   BILITOT 0.35 08/01/2017 0835       RADIOGRAPHIC STUDIES:  CT CHEST ABDOMEN PELVIS W CONTRAST Result Date: 10/24/2023 CLINICAL DATA:  Metastatic disease evaluation. Small cell carcinoma of right lung. * Tracking Code: BO * EXAM: CT CHEST, ABDOMEN, AND PELVIS WITH CONTRAST TECHNIQUE: Multidetector CT imaging of the chest, abdomen and pelvis was performed following the standard protocol during bolus administration of intravenous contrast. RADIATION DOSE  REDUCTION: This exam was performed according to the departmental dose-optimization program which includes automated exposure control, adjustment of the mA and/or kV according to patient size and/or use of iterative reconstruction technique. CONTRAST:  80mL OMNIPAQUE IOHEXOL 300 MG/ML  SOLN COMPARISON:  CT scan chest, abdomen and pelvis from 07/03/2023. FINDINGS: CT CHEST FINDINGS Cardiovascular: Normal cardiac size. No pericardial effusion. No aortic aneurysm. There are coronary artery calcifications, in keeping with coronary artery disease. There are also mild peripheral atherosclerotic vascular calcifications of thoracic aorta and its major branches. Mediastinum/Nodes: Stable appearance of thyroid gland exhibiting calcified nodule  along the right inferior lobe. No solid / cystic mediastinal masses. The esophagus is nondistended precluding optimal assessment. No axillary, mediastinal or hilar lymphadenopathy by size criteria. Lungs/Pleura: The central tracheo-bronchial tree is patent. Redemonstration of peripheral triangular opacity with its apex towards hilum and base along the parietal pleura, compatible with posttreatment changes. No significant interval change since the prior study. No new mass or consolidation. No pleural effusion or pneumothorax. No suspicious lung nodules. Musculoskeletal: A CT Port-a-Cath is seen in the right upper chest wall with the catheter terminating in the cavo-atrial junction region. Visualized soft tissues of the chest wall are otherwise grossly unremarkable. No suspicious osseous lesions. There are mild multilevel degenerative changes in the visualized spine. Stable, mild anterior wedging deformity of T6, T7 vertebrae and moderate anterior wedging deformity of T8 vertebra seen. No significant retropulsion or spinal canal compromise. CT ABDOMEN PELVIS FINDINGS Hepatobiliary: The liver is normal in size. Non-cirrhotic configuration. No suspicious mass. No intrahepatic or extrahepatic  bile duct dilation. No calcified gallstones. Normal gallbladder wall thickness. No pericholecystic inflammatory changes. Pancreas: Unremarkable. No pancreatic ductal dilatation or surrounding inflammatory changes. Spleen: Within normal limits. No focal lesion. Adrenals/Urinary Tract: Adrenal glands are unremarkable. No suspicious renal mass. No hydronephrosis. No renal or ureteric calculi. Unremarkable urinary bladder. Stomach/Bowel: No disproportionate dilation of the small or large bowel loops. No evidence of abnormal bowel wall thickening or inflammatory changes. The appendix was not visualized; however there is no acute inflammatory process in the right lower quadrant. Vascular/Lymphatic: No ascites or pneumoperitoneum. No abdominal or pelvic lymphadenopathy, by size criteria. No aneurysmal dilation of the major abdominal arteries. There are moderate peripheral atherosclerotic vascular calcifications of the aorta and its major branches. Reproductive: The uterus is surgically absent. No large adnexal mass. Other: The visualized soft tissues and abdominal wall are unremarkable. Musculoskeletal: No suspicious osseous lesions. There are mild multilevel degenerative changes in the visualized spine. There is stable mild anterior wedging deformity of L2 vertebral body. No significant retropulsion or spinal canal compromise. IMPRESSION: 1. No metastatic disease identified within the chest, abdomen or pelvis. 2. Stable posttreatment changes in the right lung upper lobe. 3. Multiple other nonacute observations, as described above. Aortic Atherosclerosis (ICD10-I70.0). Electronically Signed   By: Jules Schick M.D.   On: 10/24/2023 09:29     ASSESSMENT/PLAN:  This is a very pleasant 63 year old Caucasian female with extensive stage small cell lung cancer.  She presented with a right upper lobe lung mass, large right anterior mediastinal and supraclavicular lymphadenopathy as well as a pancreatic and splenic  metastasis.  She was diagnosed in September 2017.    The patient underwent systemic chemotherapy with carboplatin and etoposide.  She is status post 6 cycles.  She tolerated treatment well except for chemotherapy-induced anemia which required  pRBCs and platelet transfusions.  She had a significant improvement of her disease with chemotherapy. The patient then underwent prophylactic cranial irradiation. She then underwent stereotactic radiotherapy to the right upper lobe and pulmonary nodule.  She had been on observation for 2 years before showing evidence of disease progression.    She then was started on systemic chemotherapy with carboplatin, etoposide, and Tecentriq. Starting from cycle #5, she has been on maintenance single agent Tecentriq.  She has been tolerating treatment well. She is status post 87 cycles of Tecentriq total.   The patient was seen with Dr. Arbutus Ped today.  Dr. Arbutus Ped personally and independently reviewed the scan and discussed results with the patient today.  The scan showed  no evidence of disease progression.  Dr. Arbutus Ped recommends she continue on immunotherapy.   Labs were reviewed. Her CMP is pending. As long as this is in parameters, recommend that she proceed with cycle #88 today as schedule.   We will see her back in 3 weeks for evaluation and repeat blood work before considering starting cycle #89    We will reach out to palliative care to reschedule her missed appointment from the snow last week and for refills.   The patient was advised to call immediately if she has any concerning symptoms in the interval. The patient voices understanding of current disease status and treatment options and is in agreement with the current care plan. All questions were answered. The patient knows to call the clinic with any problems, questions or concerns. We can certainly see the patient much sooner if necessary  No orders of the defined types were placed in this  encounter.   Raeanne Deschler L Shelvie Salsberry, PA-C 10/28/23  ADDENDUM: Hematology/Oncology Attending:  I had a face-to-face encounter with the patient today.  I reviewed her records, lab, scan and recommended her care plan.  This is a very pleasant 63 years old white female who came to the clinic today for follow-up visit accompanied by her husband.  The patient has a history of extensive stage small cell lung cancer that was diagnosed initially in September 2019 with evidence of disease progression in 2019.  She started systemic chemotherapy with carboplatin, etoposide and atezolizumab for 6 cycles followed by maintenance treatment with single agent atezolizumab every 3 weeks status post 87 cycles. She has been tolerating her treatment well with no concerning complaints.  It is for the first time in a long time that the patient came walking to the clinic today with a walker other than a wheelchair.  She is feeling much stronger.  She had repeat CT scan of the chest, abdomen and pelvis performed recently.  I personally and independently reviewed the scan and discussed the results with the patient and her husband.  Her scan showed no concerning findings for disease progression. I recommended for her to continue her current maintenance treatment with atezolizumab and she will proceed with cycle #88 today. For the hypokalemia, we will advised the patient to take extra doses of potassium chloride. She will come back for follow-up visit in 3 weeks for evaluation before the next cycle of her treatment.  For pain management she is followed by the palliative care team. The patient was advised to call immediately if she has any other concerning symptoms in the interval. The total time spent in the appointment was 30 minutes. Disclaimer: This note was dictated with voice recognition software. Similar sounding words can inadvertently be transcribed and may be missed upon review. Lajuana Matte, MD

## 2023-10-28 ENCOUNTER — Inpatient Hospital Stay: Payer: Medicare Other | Attending: Internal Medicine

## 2023-10-28 ENCOUNTER — Other Ambulatory Visit: Payer: Self-pay | Admitting: Physician Assistant

## 2023-10-28 ENCOUNTER — Inpatient Hospital Stay: Payer: Medicare Other | Attending: Internal Medicine | Admitting: Physician Assistant

## 2023-10-28 ENCOUNTER — Inpatient Hospital Stay: Payer: Medicare Other

## 2023-10-28 ENCOUNTER — Other Ambulatory Visit: Payer: Self-pay

## 2023-10-28 VITALS — BP 129/79 | HR 98 | Temp 97.6°F | Resp 16 | Wt 94.3 lb

## 2023-10-28 DIAGNOSIS — Z7902 Long term (current) use of antithrombotics/antiplatelets: Secondary | ICD-10-CM | POA: Diagnosis not present

## 2023-10-28 DIAGNOSIS — I10 Essential (primary) hypertension: Secondary | ICD-10-CM | POA: Insufficient documentation

## 2023-10-28 DIAGNOSIS — C7800 Secondary malignant neoplasm of unspecified lung: Secondary | ICD-10-CM | POA: Diagnosis not present

## 2023-10-28 DIAGNOSIS — C3411 Malignant neoplasm of upper lobe, right bronchus or lung: Secondary | ICD-10-CM

## 2023-10-28 DIAGNOSIS — E876 Hypokalemia: Secondary | ICD-10-CM

## 2023-10-28 DIAGNOSIS — I700 Atherosclerosis of aorta: Secondary | ICD-10-CM | POA: Insufficient documentation

## 2023-10-28 DIAGNOSIS — R5383 Other fatigue: Secondary | ICD-10-CM

## 2023-10-28 DIAGNOSIS — F419 Anxiety disorder, unspecified: Secondary | ICD-10-CM

## 2023-10-28 DIAGNOSIS — C7889 Secondary malignant neoplasm of other digestive organs: Secondary | ICD-10-CM | POA: Diagnosis not present

## 2023-10-28 DIAGNOSIS — E785 Hyperlipidemia, unspecified: Secondary | ICD-10-CM | POA: Insufficient documentation

## 2023-10-28 DIAGNOSIS — C3491 Malignant neoplasm of unspecified part of right bronchus or lung: Secondary | ICD-10-CM | POA: Diagnosis not present

## 2023-10-28 DIAGNOSIS — Z5986 Financial insecurity: Secondary | ICD-10-CM | POA: Diagnosis not present

## 2023-10-28 DIAGNOSIS — Z923 Personal history of irradiation: Secondary | ICD-10-CM | POA: Insufficient documentation

## 2023-10-28 DIAGNOSIS — Z79899 Other long term (current) drug therapy: Secondary | ICD-10-CM | POA: Insufficient documentation

## 2023-10-28 DIAGNOSIS — Z9221 Personal history of antineoplastic chemotherapy: Secondary | ICD-10-CM | POA: Insufficient documentation

## 2023-10-28 DIAGNOSIS — G894 Chronic pain syndrome: Secondary | ICD-10-CM | POA: Insufficient documentation

## 2023-10-28 DIAGNOSIS — Z5112 Encounter for antineoplastic immunotherapy: Secondary | ICD-10-CM | POA: Insufficient documentation

## 2023-10-28 DIAGNOSIS — Z85828 Personal history of other malignant neoplasm of skin: Secondary | ICD-10-CM | POA: Diagnosis not present

## 2023-10-28 DIAGNOSIS — R54 Age-related physical debility: Secondary | ICD-10-CM | POA: Insufficient documentation

## 2023-10-28 DIAGNOSIS — I2510 Atherosclerotic heart disease of native coronary artery without angina pectoris: Secondary | ICD-10-CM | POA: Insufficient documentation

## 2023-10-28 DIAGNOSIS — M8008XA Age-related osteoporosis with current pathological fracture, vertebra(e), initial encounter for fracture: Secondary | ICD-10-CM | POA: Diagnosis not present

## 2023-10-28 DIAGNOSIS — G893 Neoplasm related pain (acute) (chronic): Secondary | ICD-10-CM

## 2023-10-28 LAB — CBC WITH DIFFERENTIAL (CANCER CENTER ONLY)
Abs Immature Granulocytes: 0.01 10*3/uL (ref 0.00–0.07)
Basophils Absolute: 0 10*3/uL (ref 0.0–0.1)
Basophils Relative: 1 %
Eosinophils Absolute: 0.2 10*3/uL (ref 0.0–0.5)
Eosinophils Relative: 3 %
HCT: 35.3 % — ABNORMAL LOW (ref 36.0–46.0)
Hemoglobin: 12 g/dL (ref 12.0–15.0)
Immature Granulocytes: 0 %
Lymphocytes Relative: 32 %
Lymphs Abs: 1.9 10*3/uL (ref 0.7–4.0)
MCH: 37.4 pg — ABNORMAL HIGH (ref 26.0–34.0)
MCHC: 34 g/dL (ref 30.0–36.0)
MCV: 110 fL — ABNORMAL HIGH (ref 80.0–100.0)
Monocytes Absolute: 0.4 10*3/uL (ref 0.1–1.0)
Monocytes Relative: 6 %
Neutro Abs: 3.5 10*3/uL (ref 1.7–7.7)
Neutrophils Relative %: 58 %
Platelet Count: 259 10*3/uL (ref 150–400)
RBC: 3.21 MIL/uL — ABNORMAL LOW (ref 3.87–5.11)
RDW: 13.9 % (ref 11.5–15.5)
WBC Count: 6 10*3/uL (ref 4.0–10.5)
nRBC: 0 % (ref 0.0–0.2)

## 2023-10-28 LAB — CMP (CANCER CENTER ONLY)
ALT: 15 U/L (ref 0–44)
AST: 20 U/L (ref 15–41)
Albumin: 3.7 g/dL (ref 3.5–5.0)
Alkaline Phosphatase: 109 U/L (ref 38–126)
Anion gap: 7 (ref 5–15)
BUN: <5 mg/dL — ABNORMAL LOW (ref 8–23)
CO2: 26 mmol/L (ref 22–32)
Calcium: 8.8 mg/dL — ABNORMAL LOW (ref 8.9–10.3)
Chloride: 106 mmol/L (ref 98–111)
Creatinine: 0.62 mg/dL (ref 0.44–1.00)
GFR, Estimated: >60 mL/min (ref >60)
Glucose, Bld: 106 mg/dL — ABNORMAL HIGH (ref 70–99)
Potassium: 3.1 mmol/L — ABNORMAL LOW (ref 3.5–5.1)
Sodium: 139 mmol/L (ref 135–145)
Total Bilirubin: 0.4 mg/dL (ref 0.0–1.2)
Total Protein: 6.2 g/dL — ABNORMAL LOW (ref 6.5–8.1)

## 2023-10-28 LAB — TSH: TSH: 3.265 u[IU]/mL (ref 0.350–4.500)

## 2023-10-28 MED ORDER — SODIUM CHLORIDE 0.9% FLUSH
10.0000 mL | INTRAVENOUS | Status: DC | PRN
  Administered 2023-10-28: 10 mL

## 2023-10-28 MED ORDER — POTASSIUM CHLORIDE 20 MEQ/15ML (10%) PO SOLN: 20.0000 meq | Freq: Two times a day (BID) | ORAL | 0 refills | Status: DC

## 2023-10-28 MED ORDER — SODIUM CHLORIDE 0.9 % IV SOLN
1200.0000 mg | Freq: Once | INTRAVENOUS | Status: AC
  Administered 2023-10-28: 1200 mg via INTRAVENOUS
  Filled 2023-10-28: qty 20

## 2023-10-28 MED ORDER — SODIUM CHLORIDE 0.9 % IV SOLN: Freq: Once | INTRAVENOUS | Status: AC

## 2023-10-28 MED ORDER — TRAMADOL HCL 50 MG PO TABS: 50.0000 mg | ORAL_TABLET | Freq: Four times a day (QID) | ORAL | 0 refills | Status: DC | PRN

## 2023-10-28 MED ORDER — HEPARIN SOD (PORK) LOCK FLUSH 100 UNIT/ML IV SOLN
500.0000 [IU] | Freq: Once | INTRAVENOUS | Status: AC | PRN
  Administered 2023-10-28: 500 [IU]

## 2023-10-28 MED ORDER — DIAZEPAM 5 MG PO TABS
5.0000 mg | ORAL_TABLET | Freq: Three times a day (TID) | ORAL | 0 refills | Status: DC | PRN
Start: 2023-10-28 — End: 2023-12-13

## 2023-10-28 NOTE — Progress Notes (Signed)
 Patient stated she had been only taking PO K once a day.  RN instructed patient that order was for 15 mLs BID.  Patient and patient's spouse verbalized understanding.

## 2023-10-28 NOTE — Patient Instructions (Signed)
 [facility] - A DEPT OF [facility]  Discharge Instructions: Thank you for choosing [facility] to provide your oncology and hematology care.   If you have a lab appointment with the Cancer Center, please go directly to the Cancer Center and check in at the registration area.   Wear comfortable clothing and clothing appropriate for easy access to any Portacath or PICC line.   We strive to give you quality time with your provider. You may need to reschedule your appointment if you arrive late (15 or more minutes).  Arriving late affects you and other patients whose appointments are after yours.  Also, if you miss three or more appointments without notifying the office, you may be dismissed from the clinic at the provider's discretion.      For prescription refill requests, have your pharmacy contact our office and allow 72 hours for refills to be completed.    Today you received the following chemotherapy and/or immunotherapy agents: Tecentriq      To help prevent nausea and vomiting after your treatment, we encourage you to take your nausea medication as directed.  BELOW ARE SYMPTOMS THAT SHOULD BE REPORTED IMMEDIATELY: *FEVER GREATER THAN 100.4 F (38 C) OR HIGHER *CHILLS OR SWEATING *NAUSEA AND VOMITING THAT IS NOT CONTROLLED WITH YOUR NAUSEA MEDICATION *UNUSUAL SHORTNESS OF BREATH *UNUSUAL BRUISING OR BLEEDING *URINARY PROBLEMS (pain or burning when urinating, or frequent urination) *BOWEL PROBLEMS (unusual diarrhea, constipation, pain near the anus) TENDERNESS IN MOUTH AND THROAT WITH OR WITHOUT PRESENCE OF ULCERS (sore throat, sores in mouth, or a toothache) UNUSUAL RASH, SWELLING OR PAIN  UNUSUAL VAGINAL DISCHARGE OR ITCHING   Items with * indicate a potential emergency and should be followed up as soon as possible or go to the Emergency Department if any problems should occur.  Please show the CHEMOTHERAPY ALERT CARD or IMMUNOTHERAPY  ALERT CARD at check-in to the Emergency Department and triage nurse.  Should you have questions after your visit or need to cancel or reschedule your appointment, please contact [facility] - A DEPT OF [facility]  Dept: [phone]  and follow the prompts.  Office hours are 8:00 a.m. to 4:30 p.m. Monday - Friday. Please note that voicemails left after 4:00 p.m. may not be returned until the following business day.  We are closed weekends and major holidays. You have access to a nurse at all times for urgent questions. Please call the main number to the clinic Dept: [phone] and follow the prompts.   For any non-urgent questions, you may also contact your provider using MyChart. We now offer e-Visits for anyone 20 and older to request care online for non-urgent symptoms. For details visit [email or web address].   Also download the MyChart app! Go to the app store, search "MyChart", open the app, select [place], and log in with your MyChart username and password.  Hypokalemia Hypokalemia means that the amount of potassium in the blood is lower than normal. Potassium is a mineral (electrolyte) that helps regulate the amount of fluid in the body. It also stimulates muscle tightening (contraction) and helps nerves work properly. Normally, most of the body's potassium is inside cells, and only a very small amount is in the blood. Because the amount in the blood is so small, minor changes to potassium levels in the blood can be life-threatening. What are the causes? This condition may be caused by: Antibiotic medicine.  Diarrhea or vomiting. Taking too much of a medicine that helps you have a bowel movement (laxative) can cause diarrhea and lead to hypokalemia. Chronic kidney disease (CKD). Medicines that help the body get rid of excess fluid (diuretics). Eating disorders, such as anorexia or bulimia. Low magnesium levels in the body. Sweating a lot. What are  the signs or symptoms? Symptoms of this condition include: Weakness. Constipation. Fatigue. Muscle cramps. Mental confusion. Skipped heartbeats or irregular heartbeat (palpitations). Tingling or numbness. How is this diagnosed? This condition is diagnosed with a blood test. How is this treated? This condition may be treated by: Taking potassium supplements. Adjusting the medicines that you take. Eating more foods that contain a lot of potassium. If your potassium level is very low, you may need to get potassium through an IV and be monitored in the hospital. Follow these instructions at home: Eating and drinking  Eat a healthy diet. A healthy diet includes fresh fruits and vegetables, whole grains, healthy fats, and lean proteins. If told, eat more foods that contain a lot of potassium. These include: Nuts, such as peanuts and pistachios. Seeds, such as sunflower seeds and pumpkin seeds. Peas, lentils, and lima beans. Whole grain and bran cereals and breads. Fresh fruits and vegetables, such as apricots, avocado, bananas, cantaloupe, kiwi, oranges, tomatoes, asparagus, and potatoes. Juices, such as orange, tomato, and prune. Lean meats, including fish. Milk and milk products, such as yogurt. General instructions Take over-the-counter and prescription medicines only as told by your health care provider. This includes vitamins, natural food products, and supplements. Keep all follow-up visits. This is important. Contact a health care provider if: You have weakness that gets worse. You feel your heart pounding or racing. You vomit. You have diarrhea. You have diabetes and you have trouble keeping your blood sugar in your target range. Get help right away if: You have chest pain. You have shortness of breath. You have vomiting or diarrhea that lasts for more than 2 days. You faint. These symptoms may be an emergency. Get help right away. Call 911. Do not wait to see if the  symptoms will go away. Do not drive yourself to the hospital. Summary Hypokalemia means that the amount of potassium in the blood is lower than normal. This condition is diagnosed with a blood test. Hypokalemia may be treated by taking potassium supplements, adjusting the medicines that you take, or eating more foods that are high in potassium. If your potassium level is very low, you may need to get potassium through an IV and be monitored in the hospital. This information is not intended to replace advice given to you by your health care provider. Make sure you discuss any questions you have with your health care provider. Document Revised: 05/03/2021 Document Reviewed: 05/03/2021 Elsevier Patient Education  2024 [employer].

## 2023-11-12 ENCOUNTER — Ambulatory Visit: Payer: Medicare Other | Admitting: Internal Medicine

## 2023-11-12 ENCOUNTER — Encounter: Payer: Medicare Other | Admitting: Dietician

## 2023-11-12 ENCOUNTER — Ambulatory Visit: Payer: Medicare Other

## 2023-11-12 ENCOUNTER — Other Ambulatory Visit: Payer: Medicare Other

## 2023-11-18 NOTE — Progress Notes (Deleted)
 Palliative Medicine [facility]  [phone] [phone]) [phone]   Name: Debra Kidd Date: 11/18/2023 MRN: [id number]  DOB: Apr 12, 1961  Patient Care Team: Pcp, No as PCP - General    INTERVAL HISTORY: Debra Kidd is a 63 y.o. 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  Palliative ask to see for symptom management and goals of care.   SOCIAL HISTORY:     reports that she has been smoking cigarettes. She has a 86 pack-year smoking history. She has never used smokeless tobacco. She reports that she does not currently use alcohol. She reports current drug use. Frequency: 7.00 times per week. Drug: Marijuana.  ADVANCE DIRECTIVES:    CODE STATUS:   PAST MEDICAL HISTORY: Past Medical History:  Diagnosis Date   Basal cell carcinoma of cheek    L side of face   Blood type, Rh positive    Cancer (HCC)    Chronic pain syndrome    Depression 08/07/2016   Encounter for antineoplastic chemotherapy 07/17/2016   History of external beam radiation therapy 11/19/16-12/02/16   brain 25 Gy in 10 fractions   Hyperlipidemia    Hypertension    Hypertension 08/07/2016   Osteoporosis    Small cell lung cancer (HCC) dx'd 05/2016    ALLERGIES:  is allergic to codeine, motrin [ibuprofen], thiazide-type diuretics, and vicodin [hydrocodone-acetaminophen].  MEDICATIONS:  Current Outpatient Medications  Medication Sig Dispense Refill   atorvastatin (LIPITOR) 80 MG tablet TAKE ONE TABLET BY MOUTH EVERY DAY 30 tablet 11   clopidogrel (PLAVIX) 75 MG tablet TAKE ONE TABLET BY MOUTH EVERY DAY 90 tablet 0   diazepam (VALIUM) 5 MG tablet Take 1 tablet (5 mg total) by mouth every 8 (eight) hours as needed. for anxiety 60 tablet 0   diphenhydrAMINE (BENADRYL) 25 mg capsule Take 25 mg by  mouth every 6 (six) hours as needed for allergies.     lidocaine (LIDODERM) 5 % Place 1 patch onto the skin daily. Remove & Discard patch within 12 hours or as directed by MD 30 patch 0   lidocaine-prilocaine (EMLA) cream Apply 1 Application topically as needed. 30 g 2   ondansetron (ZOFRAN) 8 MG tablet Take 1 tablet (8 mg total) by mouth every 8 (eight) hours as needed for nausea or vomiting. 20 tablet 0   potassium chloride 20 MEQ/15ML (10%) SOLN Take 15 mLs (20 mEq total) by mouth 2 (two) times daily. 300 mL 0   prochlorperazine (COMPAZINE) 10 MG tablet Take 1 tablet (10 mg total) by mouth every 6 (six) hours as needed. 30 tablet 2   traMADol (ULTRAM) 50 MG tablet Take 1 tablet (50 mg total) by mouth every 6 (six) hours as needed. 60 tablet 0   triamcinolone cream (KENALOG) 0.1 % Apply 1 Application topically 2 (two) times daily. 80 g 2   No current facility-administered medications for this visit.    VITAL SIGNS: There were no vitals taken for this visit. There were no vitals filed for this visit.  Estimated body mass index is 16.7 kg/m as calculated from the following:   Height as of 09/10/23: 5\' 3"  (1.6 m).   Weight as of 10/28/23: 94 lb 4.8 oz (42.8 kg).   PERFORMANCE STATUS (ECOG) : 1 - Symptomatic but completely ambulatory  Assessment NAD, sitting in recliner  Normal breathing pattern RRR AAO x4  Discussed the use of AI scribe software for clinical note transcription with the patient, who gave verbal consent to proceed.   IMPRESSION:  Debra Kidd presents to clinic for symptom management follow-up. No acute distress. Husband presents. She is doing well overall. Denies nausea, vomiting, constipation, or diarrhea. Occasional fatigue.   The patient reports as needed use of Tramadol for her back pain. The medications remain effective, but she requires a refill. She also uses lidocaine patches for pain relief, applying them to her back, including both the lower and upper regions,  depending on the pain's location. The pain sometimes affects her ribs and spine. She currently has only two or three patches left and is concerned about the cost, noting that she purchases them from [place]. Discussed sending in a prescription to see if insurance will assist in cost. Anxiety controlled with use of Valium. No new pain concerns. Wil continue with current regimen, no changes at this time.   She is experiencing short-term memory issues and inquired about the use of Privagen for memory help. No known interactions with current medications or Tecentriq. Advise patient should be ok to start if she chose to.   All questions answered and support provided.  Assessment and Plan  Chronic Cancer Related Pain Reports effective pain control with current regimen of Tramadol and Diazepam. Lidocaine patches also providing relief. -Refill Tramadol 50mg  every 6 hours as needed and Diazepam 5mg  every 8 hours as needed. -Check cost of Lidocaine patches at the clinic's pharmacy for potential cost savings. Will send in prescription to see if insurance will assist in cost.  -No changes to regimen  Memory Loss Reports short-term memory loss. Discussed potential use of Privagen as patient expresses desire to take. -No knonw interactions between Privagen and current treatment regimen.  Follow-up / General Health Maintenance -Next appointment scheduled for October 22, 2023. -Scan to be scheduled by Cassie, PA.  -Return to clinic for labs and treatment as scheduled.  Patient expressed understanding and was in agreement with this plan. She also understands that She can call the clinic at any time with any questions, concerns, or complaints.   Any controlled substances utilized were prescribed in the context of palliative care. PDMP has been reviewed.    Visit consisted of counseling and education dealing with the complex and emotionally intense issues of symptom management and palliative care in the  setting of serious and potentially life-threatening illness.  [name], AGPCNP-BC  Palliative Medicine Team/[facility]

## 2023-11-19 ENCOUNTER — Inpatient Hospital Stay: Payer: Medicare Other | Admitting: Nurse Practitioner

## 2023-11-19 ENCOUNTER — Inpatient Hospital Stay: Payer: Medicare Other | Admitting: Internal Medicine

## 2023-11-19 ENCOUNTER — Inpatient Hospital Stay: Payer: Medicare Other

## 2023-11-19 ENCOUNTER — Inpatient Hospital Stay: Payer: Medicare Other | Admitting: Dietician

## 2023-11-19 ENCOUNTER — Inpatient Hospital Stay: Payer: Medicare Other | Attending: Internal Medicine

## 2023-11-19 ENCOUNTER — Telehealth: Payer: Self-pay | Admitting: *Deleted

## 2023-11-19 ENCOUNTER — Telehealth: Payer: Self-pay | Admitting: Internal Medicine

## 2023-11-19 DIAGNOSIS — E785 Hyperlipidemia, unspecified: Secondary | ICD-10-CM | POA: Insufficient documentation

## 2023-11-19 DIAGNOSIS — Z79899 Other long term (current) drug therapy: Secondary | ICD-10-CM | POA: Insufficient documentation

## 2023-11-19 DIAGNOSIS — Z85828 Personal history of other malignant neoplasm of skin: Secondary | ICD-10-CM | POA: Insufficient documentation

## 2023-11-19 DIAGNOSIS — Z923 Personal history of irradiation: Secondary | ICD-10-CM | POA: Insufficient documentation

## 2023-11-19 DIAGNOSIS — R5383 Other fatigue: Secondary | ICD-10-CM | POA: Insufficient documentation

## 2023-11-19 DIAGNOSIS — F1290 Cannabis use, unspecified, uncomplicated: Secondary | ICD-10-CM | POA: Insufficient documentation

## 2023-11-19 DIAGNOSIS — C778 Secondary and unspecified malignant neoplasm of lymph nodes of multiple regions: Secondary | ICD-10-CM | POA: Insufficient documentation

## 2023-11-19 DIAGNOSIS — E876 Hypokalemia: Secondary | ICD-10-CM | POA: Insufficient documentation

## 2023-11-19 DIAGNOSIS — C7889 Secondary malignant neoplasm of other digestive organs: Secondary | ICD-10-CM | POA: Insufficient documentation

## 2023-11-19 DIAGNOSIS — G894 Chronic pain syndrome: Secondary | ICD-10-CM | POA: Insufficient documentation

## 2023-11-19 DIAGNOSIS — C3411 Malignant neoplasm of upper lobe, right bronchus or lung: Secondary | ICD-10-CM | POA: Insufficient documentation

## 2023-11-19 DIAGNOSIS — I10 Essential (primary) hypertension: Secondary | ICD-10-CM | POA: Insufficient documentation

## 2023-11-19 DIAGNOSIS — Z9221 Personal history of antineoplastic chemotherapy: Secondary | ICD-10-CM | POA: Insufficient documentation

## 2023-11-19 DIAGNOSIS — Z5112 Encounter for antineoplastic immunotherapy: Secondary | ICD-10-CM | POA: Insufficient documentation

## 2023-11-19 NOTE — Telephone Encounter (Signed)
 Contacted patient due to missed scheduled appointments this morning - spouse Tinnie Gens answered. Per spouse, patient sick since yesterday - feels bad, has cough and fever (no thermometer in home - but feels warm). He said patient did not want to come to [facility] and spread germs. Mr. Arman said it was so close to appt that they didn't call.  Dr. Arbutus Ped informed  - he said patient should contact PCP if symptoms persist beyond today. Her appts for today will be r/s for next week.  Called patient - spoke with patient and gave her Dr. Asa Lente response. Advised her that scheduling will contact her to r/s her appts. She acknowledged the information and said she'd call her PCP if she was no better.  Schedule message sent

## 2023-11-19 NOTE — Telephone Encounter (Signed)
 Rescheduled appointments per patient request. The patient missed appointments today due to being sick.

## 2023-11-22 NOTE — Progress Notes (Unsigned)
  [facility] OFFICE PROGRESS NOTE  Pcp, No No address on file  DIAGNOSIS:  Extensive stage (T2a, N3, [name], large right anterior mediastinal and supraclavicular lymphadenopathy as well as pancreatic and splenic metastasis diagnosed in September 2017.  The patient had disease progression in October 2019.   PRIOR THERAPY: 1) Systemic chemotherapy was carboplatin for AUC of 5 on day 1 and etoposide 100 MG/M2 on days 1, 2 and 3 with Neulasta support. Status post 6 cycles with significant response of her disease. 2) Prophylactic cranial irradiation under the care of Dr. Roselind Messier on 12/02/2016. 3) stereotactic radiotherapy to the recurrent right upper lobe pulmonary nodule under the care of Dr. Roselind Messier completed November 10, 2017. 4) Retreatment with systemic chemotherapy with carboplatin for AUC of 5 on day 1 and etoposide 100 mg/M2 on days 1, 2 and 3 as well as Tecentriq (Atezolizumab) 1200 mg IV every 3 weeks with Neulasta support.  First dose June 22, 2018 for disease recurrence.  Status post 5 cycles.  Starting from cycle #2 her dose of carboplatin will be reduced to AUC of 4 and etoposide 80 mg/M2 on days 1, 2 and 3 in addition to the regular dose of Tecentriq  CURRENT THERAPY:  Maintenance treatment with single agent Tecentriq 1200 mg IV every 3 weeks.  Status post 88 cycles.   INTERVAL HISTORY: Debra Kidd 63 y.o. female returns to the clinic today for a follow-up visit.  She missed her appointment last week due to not feeling well. She had cough and felt febrile.  She does not have a thermometer so she did not check her temperature.  She states this lasted about 4 days.  Denies any sick contacts.  She reports that she has recovered and feels better at this time.  The patient is currently being followed for metastatic small cell lung cancer for several years for which she has been on maintenance Tecentriq IV every 3  weeks which is good considering she is in overall poor health due to her other comorbidity such as history of stroke/left sided weakness and significant vascular stenosis. She does tolerate this well except for dry skin and itching. She uses hydrocortisone cream and benadryl.  States that she has to buy hydrocortisone tubes and a 2 pack.  She is not sure if she ever picked up the triamcinolone cream that was prescribed last year.   She is followed closely by palliative care and nutrition. She has a lot of financial constraints.  Her house also sounds that it is not well maintained.  She reports that she has a hard time affording foods.  Additionally her 2 forms of transportation recently broke down.  [place] and does not think that the transportation team would be able to find her house.  She reports her baseline dyspnea on exertion which is stable without any chest pain or hemoptysis. She reports a baseline mild cough. She continues to smoke approximately 2 packs of cigarettes per day. She has intermittent diarrhea alternating with constipation which is normal for her.  She frequently has hypokalemia on labs. She cannot tolerate large pills and therefore takes liquid potassium. She may have nausea due to smell aversions to certain foods. She continues to have baseline fatigue and she is overall deconditioned and not very active at home which is stable. She is here for evaluation and repeat blood work before undergoing cycle #89.  MEDICAL HISTORY: Past Medical History:  Diagnosis Date   Basal cell carcinoma of cheek    L side of face   Blood type, Rh positive    Cancer (HCC)    Chronic pain syndrome    Depression 08/07/2016   Encounter for antineoplastic chemotherapy 07/17/2016   History of external beam radiation therapy 11/19/16-12/02/16   brain 25 Gy in 10 fractions   Hyperlipidemia    Hypertension    Hypertension 08/07/2016   Osteoporosis    Small cell lung cancer  (HCC) dx'd 05/2016    ALLERGIES:  is allergic to codeine, motrin [ibuprofen], thiazide-type diuretics, and vicodin [hydrocodone-acetaminophen].  MEDICATIONS:  Current Outpatient Medications  Medication Sig Dispense Refill   atorvastatin (LIPITOR) 80 MG tablet TAKE ONE TABLET BY MOUTH EVERY DAY 30 tablet 11   clopidogrel (PLAVIX) 75 MG tablet TAKE ONE TABLET BY MOUTH EVERY DAY 90 tablet 0   diazepam (VALIUM) 5 MG tablet Take 1 tablet (5 mg total) by mouth every 8 (eight) hours as needed. for anxiety 60 tablet 0   diphenhydrAMINE (BENADRYL) 25 mg capsule Take 25 mg by mouth every 6 (six) hours as needed for allergies.     lidocaine (LIDODERM) 5 % Place 1 patch onto the skin daily. Remove & Discard patch within 12 hours or as directed by MD 30 patch 0   lidocaine-prilocaine (EMLA) cream Apply 1 Application topically as needed. 30 g 2   ondansetron (ZOFRAN) 8 MG tablet Take 1 tablet (8 mg total) by mouth every 8 (eight) hours as needed for nausea or vomiting. 20 tablet 0   potassium chloride 20 MEQ/15ML (10%) SOLN Take 15 mLs (20 mEq total) by mouth 2 (two) times daily. 300 mL 0   prochlorperazine (COMPAZINE) 10 MG tablet Take 1 tablet (10 mg total) by mouth every 6 (six) hours as needed. 30 tablet 2   traMADol (ULTRAM) 50 MG tablet Take 1 tablet (50 mg total) by mouth every 6 (six) hours as needed. 60 tablet 0   triamcinolone cream (KENALOG) 0.1 % Apply 1 Application topically 2 (two) times daily. 453.6 g 2   No current facility-administered medications for this visit.    SURGICAL HISTORY:  Past Surgical History:  Procedure Laterality Date   ABDOMINAL HYSTERECTOMY  1981   APPENDECTOMY     at age 42   EYE SURGERY     left   IR GENERIC HISTORICAL  06/03/2016   IR FLUORO GUIDE PORT INSERTION RIGHT 06/03/2016 [facility]   IR GENERIC HISTORICAL  06/03/2016   IR US GUIDE VASC ACCESS RIGHT 06/03/2016 [facility]   SKIN CANCER EXCISION     basal cell carcinoma L side of face    REVIEW OF  SYSTEMS:   Constitutional: Positive for stable decreased appetite and fatigue. Negative for chills, fever and unexpected weight change.  HENT: Negative for mouth sores, nosebleeds, sore throat and trouble swallowing.  Eyes: Negative for eye problems and icterus.  Respiratory: Positive for stable SOB and mild cough which are her baseline. Negative for hemoptysis, and wheezing.   Cardiovascular: Negative for chest pain and leg swelling.  Gastrointestinal: She sometimes alternates between diarrhea/constipation. Negative for abdominal pain.  Denies any nausea or vomiting at this time. Genitourinary: Negative for bladder incontinence, difficulty urinating, dysuria, frequency and hematuria.   Musculoskeletal: Positive for chronic back pain.  Negative for gait problem, neck pain and neck stiffness.  Skin: Positive for occasional itching. Negative for rash.  Neurological: Positive for generalized weakness.  Positive  for left-sided weakness due to her history of stroke.  Negative for extremity weakness, gait problem, [name], depression and sleep disturbance. The patient is not nervous/anxious    PHYSICAL EXAMINATION:  There were no vitals taken for this visit.  ECOG PERFORMANCE STATUS: 2  Physical Exam  Constitutional: Oriented to person, place, and time and chronically ill appearing female appears older than stated age and in no distress. HENT: Head: Normocephalic and atraumatic. Mouth/Throat: Oropharynx is clear and moist. No oropharyngeal exudate. Eyes: Conjunctivae are normal. Right eye exhibits no discharge. Left eye exhibits no discharge. No scleral icterus. Neck: Normal range of motion. Neck supple. Cardiovascular: Normal rate, regular rhythm, murmur noted in right second intercostal space and intact distal pulses.   Pulmonary/Chest: Effort normal.   Quiet breath sounds in all lung fields.  No respiratory distress. No wheezes. No rales. Abdominal: Soft. Bowel sounds are normal. Exhibits no distension and no mass. There is no tenderness.  Musculoskeletal: She has limited range of motion in her left shoulder secondary to pain.  Exhibits no edema.   Lymphadenopathy:    slightly enlarged symmetric left and right upper cervical lymph node.  Neurological: Alert and oriented to person, place, and time. Exhibits muscle wasting. Chronic left sided weakness due to hx of CVA. Ambulated with a walker today. Skin: Skin is warm and dry. No rash noted. Not diaphoretic. No erythema. No pallor.  Psychiatric: Mood, memory and judgment normal. Vitals reviewed.    LABORATORY DATA: Lab Results  Component Value Date   WBC 6.7 11/26/2023   HGB 11.7 (L) 11/26/2023   HCT 34.3 (L) 11/26/2023   MCV 106.9 (H) 11/26/2023   PLT 226 11/26/2023      Chemistry      Component Value Date/Time   NA 139 11/26/2023 1325   NA 141 08/01/2017 0835   K 3.5 11/26/2023 1325   K 3.5 08/01/2017 0835   CL 105 11/26/2023 1325   CO2 28 11/26/2023 1325   CO2 25 08/01/2017 0835   BUN 5 (L) 11/26/2023 1325   BUN 8.0 08/01/2017 0835   CREATININE 0.62 11/26/2023 1325   CREATININE 0.9 08/01/2017 0835      Component Value Date/Time   CALCIUM 8.3 (L) 11/26/2023 1325   CALCIUM 9.9 08/01/2017 0835   ALKPHOS 137 (H) 11/26/2023 1325   ALKPHOS 142 08/01/2017 0835   AST 21 11/26/2023 1325   AST 15 08/01/2017 0835   ALT 17 11/26/2023 1325   ALT 19 08/01/2017 0835   BILITOT 0.4 11/26/2023 1325   BILITOT 0.35 08/01/2017 0835       RADIOGRAPHIC STUDIES:  No results found.   ASSESSMENT/PLAN:  This is a very pleasant 63 year old Caucasian female with extensive stage small cell lung cancer.  She presented with a right upper lobe lung mass, large right anterior mediastinal and supraclavicular lymphadenopathy as well as a pancreatic and splenic metastasis.  She was diagnosed  in September 2017.    The patient underwent systemic chemotherapy with carboplatin and etoposide.  She is status post 6 cycles.  She tolerated treatment well except for chemotherapy-induced anemia which required  pRBCs and platelet transfusions.  She had a significant improvement of her disease with chemotherapy. The patient then underwent prophylactic cranial irradiation. She then underwent stereotactic radiotherapy to the right upper lobe and pulmonary nodule.   She had been on observation for 2 years before showing evidence of disease progression.  She then was started on systemic chemotherapy with carboplatin, etoposide, and Tecentriq. Starting from cycle #5, she has been on maintenance single agent Tecentriq.  She has been tolerating treatment well. She is status post 88 cycles of Tecentriq total.  Labs were reviewed.  As long as this is in parameters, recommend that she proceed with cycle #89 today as schedule.    We will see her back in 3 weeks for evaluation and repeat blood work before considering starting cycle #90    I will refer her to social work due to her social and financial concerns.   The patient was advised to call immediately if she has any concerning symptoms in the interval. The patient voices understanding of current disease status and treatment options and is in agreement with the current care plan. All questions were answered. The patient knows to call the clinic with any problems, questions or concerns. We can certainly see the patient much sooner if necessary       Orders Placed This Encounter  Procedures   Ambulatory referral to Social Work    Referral Priority:   Routine    Referral Type:   Consultation    Referral Reason:   Specialty Services Required    Number of Visits Requested:   1     The total time spent in the appointment was 20-29 minutes  Ciena Sampley L Starlett Pehrson, PA-C 11/26/23

## 2023-11-26 ENCOUNTER — Inpatient Hospital Stay

## 2023-11-26 ENCOUNTER — Other Ambulatory Visit: Payer: Self-pay

## 2023-11-26 ENCOUNTER — Inpatient Hospital Stay (HOSPITAL_BASED_OUTPATIENT_CLINIC_OR_DEPARTMENT_OTHER): Admitting: Physician Assistant

## 2023-11-26 ENCOUNTER — Inpatient Hospital Stay (HOSPITAL_BASED_OUTPATIENT_CLINIC_OR_DEPARTMENT_OTHER): Admitting: Nurse Practitioner

## 2023-11-26 ENCOUNTER — Inpatient Hospital Stay: Admitting: Dietician

## 2023-11-26 ENCOUNTER — Other Ambulatory Visit: Payer: Self-pay | Admitting: Physician Assistant

## 2023-11-26 ENCOUNTER — Encounter: Payer: Self-pay | Admitting: Nurse Practitioner

## 2023-11-26 VITALS — BP 104/74 | HR 89 | Temp 97.3°F | Resp 15 | Wt 96.1 lb

## 2023-11-26 DIAGNOSIS — G893 Neoplasm related pain (acute) (chronic): Secondary | ICD-10-CM | POA: Diagnosis not present

## 2023-11-26 DIAGNOSIS — L299 Pruritus, unspecified: Secondary | ICD-10-CM | POA: Diagnosis not present

## 2023-11-26 DIAGNOSIS — C3491 Malignant neoplasm of unspecified part of right bronchus or lung: Secondary | ICD-10-CM

## 2023-11-26 DIAGNOSIS — Z923 Personal history of irradiation: Secondary | ICD-10-CM | POA: Diagnosis not present

## 2023-11-26 DIAGNOSIS — Z515 Encounter for palliative care: Secondary | ICD-10-CM

## 2023-11-26 DIAGNOSIS — E785 Hyperlipidemia, unspecified: Secondary | ICD-10-CM | POA: Diagnosis not present

## 2023-11-26 DIAGNOSIS — F1290 Cannabis use, unspecified, uncomplicated: Secondary | ICD-10-CM | POA: Diagnosis not present

## 2023-11-26 DIAGNOSIS — C3411 Malignant neoplasm of upper lobe, right bronchus or lung: Secondary | ICD-10-CM | POA: Diagnosis not present

## 2023-11-26 DIAGNOSIS — G894 Chronic pain syndrome: Secondary | ICD-10-CM | POA: Diagnosis not present

## 2023-11-26 DIAGNOSIS — Z9221 Personal history of antineoplastic chemotherapy: Secondary | ICD-10-CM | POA: Diagnosis not present

## 2023-11-26 DIAGNOSIS — Z85828 Personal history of other malignant neoplasm of skin: Secondary | ICD-10-CM | POA: Diagnosis not present

## 2023-11-26 DIAGNOSIS — I10 Essential (primary) hypertension: Secondary | ICD-10-CM | POA: Diagnosis not present

## 2023-11-26 DIAGNOSIS — C778 Secondary and unspecified malignant neoplasm of lymph nodes of multiple regions: Secondary | ICD-10-CM | POA: Diagnosis not present

## 2023-11-26 DIAGNOSIS — R5383 Other fatigue: Secondary | ICD-10-CM | POA: Diagnosis not present

## 2023-11-26 DIAGNOSIS — C7889 Secondary malignant neoplasm of other digestive organs: Secondary | ICD-10-CM | POA: Diagnosis not present

## 2023-11-26 DIAGNOSIS — Z5112 Encounter for antineoplastic immunotherapy: Secondary | ICD-10-CM | POA: Diagnosis present

## 2023-11-26 DIAGNOSIS — E876 Hypokalemia: Secondary | ICD-10-CM | POA: Diagnosis not present

## 2023-11-26 DIAGNOSIS — Z79899 Other long term (current) drug therapy: Secondary | ICD-10-CM | POA: Diagnosis not present

## 2023-11-26 DIAGNOSIS — R530 Neoplastic (malignant) related fatigue: Secondary | ICD-10-CM | POA: Diagnosis not present

## 2023-11-26 LAB — CBC WITH DIFFERENTIAL (CANCER CENTER ONLY)
Abs Immature Granulocytes: 0.01 10*3/uL (ref 0.00–0.07)
Basophils Absolute: 0 10*3/uL (ref 0.0–0.1)
Basophils Relative: 0 %
Eosinophils Absolute: 0.1 10*3/uL (ref 0.0–0.5)
Eosinophils Relative: 2 %
HCT: 34.3 % — ABNORMAL LOW (ref 36.0–46.0)
Hemoglobin: 11.7 g/dL — ABNORMAL LOW (ref 12.0–15.0)
Immature Granulocytes: 0 %
Lymphocytes Relative: 31 %
Lymphs Abs: 2.1 10*3/uL (ref 0.7–4.0)
MCH: 36.4 pg — ABNORMAL HIGH (ref 26.0–34.0)
MCHC: 34.1 g/dL (ref 30.0–36.0)
MCV: 106.9 fL — ABNORMAL HIGH (ref 80.0–100.0)
Monocytes Absolute: 0.4 10*3/uL (ref 0.1–1.0)
Monocytes Relative: 6 %
Neutro Abs: 4 10*3/uL (ref 1.7–7.7)
Neutrophils Relative %: 61 %
Platelet Count: 226 10*3/uL (ref 150–400)
RBC: 3.21 MIL/uL — ABNORMAL LOW (ref 3.87–5.11)
RDW: 13 % (ref 11.5–15.5)
WBC Count: 6.7 10*3/uL (ref 4.0–10.5)
nRBC: 0 % (ref 0.0–0.2)

## 2023-11-26 LAB — CMP (CANCER CENTER ONLY)
ALT: 17 U/L (ref 0–44)
AST: 21 U/L (ref 15–41)
Albumin: 3.5 g/dL (ref 3.5–5.0)
Alkaline Phosphatase: 137 U/L — ABNORMAL HIGH (ref 38–126)
Anion gap: 6 (ref 5–15)
BUN: 5 mg/dL — ABNORMAL LOW (ref 8–23)
CO2: 28 mmol/L (ref 22–32)
Calcium: 8.3 mg/dL — ABNORMAL LOW (ref 8.9–10.3)
Chloride: 105 mmol/L (ref 98–111)
Creatinine: 0.62 mg/dL (ref 0.44–1.00)
GFR, Estimated: >60 mL/min (ref >60)
Glucose, Bld: 91 mg/dL (ref 70–99)
Potassium: 3.5 mmol/L (ref 3.5–5.1)
Sodium: 139 mmol/L (ref 135–145)
Total Bilirubin: 0.4 mg/dL (ref 0.0–1.2)
Total Protein: 6 g/dL — ABNORMAL LOW (ref 6.5–8.1)

## 2023-11-26 LAB — TSH: TSH: 1.245 u[IU]/mL (ref 0.350–4.500)

## 2023-11-26 MED ORDER — SODIUM CHLORIDE 0.9 % IV SOLN
Freq: Once | INTRAVENOUS | Status: AC
Start: 2023-11-26 — End: 2023-11-26

## 2023-11-26 MED ORDER — TRIAMCINOLONE ACETONIDE 0.1 % EX CREA: 1.0000 | TOPICAL_CREAM | Freq: Two times a day (BID) | CUTANEOUS | 2 refills | Status: DC

## 2023-11-26 MED ORDER — SODIUM CHLORIDE 0.9 % IV SOLN
1200.0000 mg | Freq: Once | INTRAVENOUS | Status: AC
  Administered 2023-11-26: 1200 mg via INTRAVENOUS
  Filled 2023-11-26: qty 20

## 2023-11-26 MED ORDER — SODIUM CHLORIDE 0.9% FLUSH
10.0000 mL | INTRAVENOUS | Status: DC | PRN
  Administered 2023-11-26: 10 mL

## 2023-11-26 MED ORDER — HEPARIN SOD (PORK) LOCK FLUSH 100 UNIT/ML IV SOLN
500.0000 [IU] | Freq: Once | INTRAVENOUS | Status: AC | PRN
  Administered 2023-11-26: 500 [IU]

## 2023-11-26 NOTE — Progress Notes (Signed)
 "    Palliative Medicine [facility]  Telephone:([phone] [phone]) [phone]   Name: DEVANEE Kidd Date: 11/26/2023 MRN: [id number]  DOB: 05/16/1961  Patient Care Team: Pcp, No as PCP - General    INTERVAL HISTORY: Debra Kidd is a 63 y.o. female with oncologic medical history including extensive stage small cell lung cancer with metastatic disease to the pancreas and spleen (05/2016) s/p chemotherapy, SRS to right upper lobe (10/2017), currently on maintenance Tecentriq . Palliative ask to see for symptom management and goals of care.   SOCIAL HISTORY:     reports that she has been smoking cigarettes. She has a 86 pack-year smoking history. She has never used smokeless tobacco. She reports that she does not currently use alcohol. She reports current drug use. Frequency: 7.00 times per week. Drug: Marijuana.  ADVANCE DIRECTIVES:  None on file   CODE STATUS: Full code  PAST MEDICAL HISTORY: Past Medical History:  Diagnosis Date   Basal cell carcinoma of cheek    L side of face   Blood type, Rh positive    Cancer (HCC)    Chronic pain syndrome    Depression 08/07/2016   Encounter for antineoplastic chemotherapy 07/17/2016   History of external beam radiation therapy 11/19/16-12/02/16   brain 25 Gy in 10 fractions   Hyperlipidemia    Hypertension    Hypertension 08/07/2016   Osteoporosis    Small cell lung cancer (HCC) dx'd 05/2016    ALLERGIES:  is allergic to codeine, motrin [ibuprofen], thiazide-type diuretics, and vicodin [hydrocodone -acetaminophen ].  MEDICATIONS:  Current Outpatient Medications  Medication Sig Dispense Refill   atorvastatin  (LIPITOR) 80 MG tablet TAKE ONE TABLET BY MOUTH EVERY DAY 30 tablet 11   clopidogrel  (PLAVIX ) 75 MG tablet TAKE ONE TABLET BY MOUTH EVERY DAY 90 tablet 0   diazepam  (VALIUM ) 5 MG tablet Take 1 tablet (5 mg total) by mouth every 8 (eight) hours as needed. for anxiety 60 tablet 0   diphenhydrAMINE  (BENADRYL ) 25 mg  capsule Take 25 mg by mouth every 6 (six) hours as needed for allergies.     lidocaine  (LIDODERM ) 5 % Place 1 patch onto the skin daily. Remove & Discard patch within 12 hours or as directed by MD 30 patch 0   lidocaine -prilocaine  (EMLA ) cream Apply 1 Application topically as needed. 30 g 2   ondansetron  (ZOFRAN ) 8 MG tablet Take 1 tablet (8 mg total) by mouth every 8 (eight) hours as needed for nausea or vomiting. 20 tablet 0   potassium chloride  20 MEQ/15ML (10%) SOLN Take 15 mLs (20 mEq total) by mouth 2 (two) times daily. 300 mL 0   prochlorperazine  (COMPAZINE ) 10 MG tablet Take 1 tablet (10 mg total) by mouth every 6 (six) hours as needed. 30 tablet 2   traMADol  (ULTRAM ) 50 MG tablet Take 1 tablet (50 mg total) by mouth every 6 (six) hours as needed. 60 tablet 0   triamcinolone  cream (KENALOG ) 0.1 % Apply 1 Application topically 2 (two) times daily. 453.6 g 2   No current facility-administered medications for this visit.    VITAL SIGNS: BP 104/74 (BP Location: Left Arm, Patient Position: Sitting)   Pulse 89   Temp (!) 97.3 F (36.3 C) (Temporal)   Resp 15   Wt 96 lb 1.6 oz (43.6 kg)   SpO2 100%   BMI 17.02 kg/m  Filed Weights   11/26/23 1359  Weight: 96 lb 1.6 oz (43.6 kg)    Estimated body mass index  is 17.02 kg/m as calculated from the following:   Height as of 09/10/23: 5' 3 (1.6 m).   Weight as of this encounter: 96 lb 1.6 oz (43.6 kg).   PERFORMANCE STATUS (ECOG) : 1 - Symptomatic but completely ambulatory   Physical Exam General: NAD Cardiovascular: regular rate and rhythm Pulmonary: normal breathing pattern Extremities: no edema, no joint deformities Skin: no rashes Neurological: AAO x3  IMPRESSION: Discussed the use of AI scribe software for clinical note transcription with the patient, who gave verbal consent to proceed.  History of Present Illness Debra Kidd is a 63 year old female who presents for palliative follow-up. She is reporting episodes of  anger and tremors. Reports overall she is doing ok. Appetite fluctuates. Some days are better than others. Her weight is up to 96 pounds from 94 pounds on February 25.  States she has noticed some hair thinning.  Denies concerns of nausea and vomiting. She experiences gastrointestinal issues, alternating between constipation and diarrhea, describing it as a 'roller coaster'. She does not use stool softeners, as constipation often resolves into diarrhea on its own.  Patient is ambulating with a walker at visit today which is an improvement as she is usually in a wheelchair.  Debra Kidd states over the past 1-2 weeks she has began experiencing episodes of intense anger and rage triggered by minor events such as commercials or interactions with neighbors. These episodes have been affecting her family, although she acknowledges that the issue is with her and not them. She is concerned about not knowing how to manage these feelings and fears she might physically lash out at someone.She describes episodes of tremors, particularly in the morning, which are severe enough to cause her to use her non-dominant hand to hold objects. She takes diazepam  as needed.  I created space and opportunity for patient to express her thoughts and feelings.  Again she is unable to identify triggers or life changes recently that could potentially be initiating her feelings of anger.  I discussed resources such as our social work and spiritual care team for counseling and ongoing management.  Patient declines stating she has tried this in the past and it does not work.  Patient is aware waking have their team involved at any time with parameters to reach out to the team or seek community resources as needed.  She reports ongoing back pain attributed to a previous back injury and her cancer, with pain extending from the middle of her back up to her neck. She takes tramadol , usually two tablets every four to five hours, and uses lidocaine  and  prilocaine  patches, which she finds helpful. We will continue with current regimen. No adjustments at this time.   I discussed the importance of continued conversation with family and their medical providers regarding overall plan of care and treatment options, ensuring decisions are within the context of the patients values and GOCs.  Assessment and Plan Assessment & Plan Emotional lability Episodes of anger and rage triggered by minor events, impacting family. Seeks management guidance. - Patient declined consultation to social work and spritiual counseling team for support.  -Encourage self medication and to seek community support. She knows she may reach out to the medical team if she changes her mind and would like referral to our counselors.   Tremors/Anxiety Morning tremors affecting task performance. Diazepam  used for symptom management. -Continue diazepam  as needed.  Chronic/cancer related back pain Chronic pain from previous injury managed with tramadol  and lidocaine /prilocaine  patches.  Patches effective but cause hair thinning. - Continue tramadol . - Continue lidocaine /prilocaine  patches.  Constipation and diarrhea Alternating constipation and diarrhea. Avoids stool softeners. - Consider dietary modifications.  Follow-up I will plan to see patient back in 3-4 weeks. Sooner as needed.  Patient expressed understanding and was in agreement with this plan. She also understands that She can call the clinic at any time with any questions, concerns, or complaints.   Any controlled substances utilized were prescribed in the context of palliative care. PDMP has been reviewed.   Visit consisted of counseling and education dealing with the complex and emotionally intense issues of symptom management and palliative care in the setting of serious and potentially life-threatening illness.  Levon Borer, AGPCNP-BC  Palliative Medicine Team/[facility]    "

## 2023-11-26 NOTE — Progress Notes (Signed)
 Nutrition Follow-up:  Patient with extensive stage small cell lung cancer. She is currently receiving maintenance treatment with single agent Tecentriq 1200 mg IV every 3 weeks.    Met with pt in infusion. She reports having financial hardship and is feeling frustrated. Pt states that both cars broke down today. Husband able to borrow a car from co-worker so pt would not miss treatment. Pt states her windows are rotted out at home and the house stays cold. They are unable to afford groceries. Eating lots of canned chicken and rice. Pt states she is getting angry for no reason lately. Recalls getting mad at commercials on television. She does not understand why she is feeling this way.    Medications: reviewed   Labs: BUN 5  Anthropometrics: Wt 96 lb 1.6 oz today - increased   2/25 - 94 lb 4.8 oz  1/29 - 94 lb 11.2 oz    NUTRITION DIAGNOSIS: Unintended wt loss - slightly improved    INTERVENTION:  Offered supportive listening - per notes, LCSW referral placed Provided 2 food bags + one bag of toiletries     MONITORING, EVALUATION, GOAL: wt trends, intake   NEXT VISIT: Tuesday April 15 during infusion

## 2023-11-26 NOTE — Patient Instructions (Signed)
 [facility] - A DEPT OF [facility]  Discharge Instructions: Thank you for choosing [facility] to provide your oncology and hematology care.   If you have a lab appointment with the Cancer Center, please go directly to the Cancer Center and check in at the registration area.   Wear comfortable clothing and clothing appropriate for easy access to any Portacath or PICC line.   We strive to give you quality time with your provider. You may need to reschedule your appointment if you arrive late (15 or more minutes).  Arriving late affects you and other patients whose appointments are after yours.  Also, if you miss three or more appointments without notifying the office, you may be dismissed from the clinic at the provider's discretion.      For prescription refill requests, have your pharmacy contact our office and allow 72 hours for refills to be completed.    Today you received the following chemotherapy and/or immunotherapy agents: Tecentriq      To help prevent nausea and vomiting after your treatment, we encourage you to take your nausea medication as directed.  BELOW ARE SYMPTOMS THAT SHOULD BE REPORTED IMMEDIATELY: *FEVER GREATER THAN 100.4 F (38 C) OR HIGHER *CHILLS OR SWEATING *NAUSEA AND VOMITING THAT IS NOT CONTROLLED WITH YOUR NAUSEA MEDICATION *UNUSUAL SHORTNESS OF BREATH *UNUSUAL BRUISING OR BLEEDING *URINARY PROBLEMS (pain or burning when urinating, or frequent urination) *BOWEL PROBLEMS (unusual diarrhea, constipation, pain near the anus) TENDERNESS IN MOUTH AND THROAT WITH OR WITHOUT PRESENCE OF ULCERS (sore throat, sores in mouth, or a toothache) UNUSUAL RASH, SWELLING OR PAIN  UNUSUAL VAGINAL DISCHARGE OR ITCHING   Items with * indicate a potential emergency and should be followed up as soon as possible or go to the Emergency Department if any problems should occur.  Please show the CHEMOTHERAPY ALERT CARD or IMMUNOTHERAPY  ALERT CARD at check-in to the Emergency Department and triage nurse.  Should you have questions after your visit or need to cancel or reschedule your appointment, please contact [facility] - A DEPT OF [facility]  Dept: [phone]  and follow the prompts.  Office hours are 8:00 a.m. to 4:30 p.m. Monday - Friday. Please note that voicemails left after 4:00 p.m. may not be returned until the following business day.  We are closed weekends and major holidays. You have access to a nurse at all times for urgent questions. Please call the main number to the clinic Dept: [phone] and follow the prompts.   For any non-urgent questions, you may also contact your provider using MyChart. We now offer e-Visits for anyone 20 and older to request care online for non-urgent symptoms. For details visit [email or web address].   Also download the MyChart app! Go to the app store, search "MyChart", open the app, select [place], and log in with your MyChart username and password.  Hypokalemia Hypokalemia means that the amount of potassium in the blood is lower than normal. Potassium is a mineral (electrolyte) that helps regulate the amount of fluid in the body. It also stimulates muscle tightening (contraction) and helps nerves work properly. Normally, most of the body's potassium is inside cells, and only a very small amount is in the blood. Because the amount in the blood is so small, minor changes to potassium levels in the blood can be life-threatening. What are the causes? This condition may be caused by: Antibiotic medicine.  Diarrhea or vomiting. Taking too much of a medicine that helps you have a bowel movement (laxative) can cause diarrhea and lead to hypokalemia. Chronic kidney disease (CKD). Medicines that help the body get rid of excess fluid (diuretics). Eating disorders, such as anorexia or bulimia. Low magnesium levels in the body. Sweating a lot. What are  the signs or symptoms? Symptoms of this condition include: Weakness. Constipation. Fatigue. Muscle cramps. Mental confusion. Skipped heartbeats or irregular heartbeat (palpitations). Tingling or numbness. How is this diagnosed? This condition is diagnosed with a blood test. How is this treated? This condition may be treated by: Taking potassium supplements. Adjusting the medicines that you take. Eating more foods that contain a lot of potassium. If your potassium level is very low, you may need to get potassium through an IV and be monitored in the hospital. Follow these instructions at home: Eating and drinking  Eat a healthy diet. A healthy diet includes fresh fruits and vegetables, whole grains, healthy fats, and lean proteins. If told, eat more foods that contain a lot of potassium. These include: Nuts, such as peanuts and pistachios. Seeds, such as sunflower seeds and pumpkin seeds. Peas, lentils, and lima beans. Whole grain and bran cereals and breads. Fresh fruits and vegetables, such as apricots, avocado, bananas, cantaloupe, kiwi, oranges, tomatoes, asparagus, and potatoes. Juices, such as orange, tomato, and prune. Lean meats, including fish. Milk and milk products, such as yogurt. General instructions Take over-the-counter and prescription medicines only as told by your health care provider. This includes vitamins, natural food products, and supplements. Keep all follow-up visits. This is important. Contact a health care provider if: You have weakness that gets worse. You feel your heart pounding or racing. You vomit. You have diarrhea. You have diabetes and you have trouble keeping your blood sugar in your target range. Get help right away if: You have chest pain. You have shortness of breath. You have vomiting or diarrhea that lasts for more than 2 days. You faint. These symptoms may be an emergency. Get help right away. Call 911. Do not wait to see if the  symptoms will go away. Do not drive yourself to the hospital. Summary Hypokalemia means that the amount of potassium in the blood is lower than normal. This condition is diagnosed with a blood test. Hypokalemia may be treated by taking potassium supplements, adjusting the medicines that you take, or eating more foods that are high in potassium. If your potassium level is very low, you may need to get potassium through an IV and be monitored in the hospital. This information is not intended to replace advice given to you by your health care provider. Make sure you discuss any questions you have with your health care provider. Document Revised: 05/03/2021 Document Reviewed: 05/03/2021 Elsevier Patient Education  2024 [employer].

## 2023-11-27 ENCOUNTER — Inpatient Hospital Stay: Admitting: Nurse Practitioner

## 2023-11-27 ENCOUNTER — Encounter: Payer: Self-pay | Admitting: Internal Medicine

## 2023-12-03 ENCOUNTER — Ambulatory Visit: Payer: Medicare Other | Admitting: Internal Medicine

## 2023-12-03 ENCOUNTER — Telehealth: Payer: Self-pay

## 2023-12-03 ENCOUNTER — Other Ambulatory Visit: Payer: Medicare Other

## 2023-12-03 ENCOUNTER — Ambulatory Visit: Payer: Medicare Other

## 2023-12-03 NOTE — Telephone Encounter (Signed)
 Pt's husband calls to report that pt fell at home last week and has a hip fracture. She is currently at [facility] in [place]. He is unsure if pt will come home or go to a rehab facility. Pt has an appointment w/ C. Heilingoetter, PA and infusion on 4/14. Asked husband to notify office if she will be unable to make these appointments.

## 2023-12-08 ENCOUNTER — Telehealth: Payer: Self-pay | Admitting: Medical Oncology

## 2023-12-08 ENCOUNTER — Telehealth: Payer: Self-pay | Admitting: Internal Medicine

## 2023-12-08 NOTE — Telephone Encounter (Signed)
 The patients husband called to inform us of his wife being put in Hospice care. The husband asked that we cancel appointments.

## 2023-12-08 NOTE — Telephone Encounter (Signed)
 [name] fell and broke left pelvis hip and lower vertebrae. No surgery was done . Provider recommended she go on hospice and she did.    I spoke to [employer] and he said Debra Kidd  is " non transportable". She sleeps most of day and is bed bound.   He said if she rallies he will let us know.  But Hospice nurse said " she has less than two weeks prognosis".

## 2023-12-09 ENCOUNTER — Inpatient Hospital Stay: Payer: Medicare Other | Attending: Internal Medicine

## 2023-12-09 ENCOUNTER — Inpatient Hospital Stay: Payer: Medicare Other

## 2023-12-09 ENCOUNTER — Inpatient Hospital Stay: Payer: Medicare Other | Admitting: Physician Assistant

## 2023-12-11 ENCOUNTER — Telehealth: Payer: Self-pay | Admitting: Medical Oncology

## 2023-12-11 ENCOUNTER — Encounter: Payer: Self-pay | Admitting: Internal Medicine

## 2023-12-11 NOTE — Telephone Encounter (Signed)
 Pt died yesterday at home. Trey Paula thanked Dr [facility] team for her care.

## 2023-12-15 ENCOUNTER — Inpatient Hospital Stay: Admitting: Physician Assistant

## 2023-12-15 ENCOUNTER — Inpatient Hospital Stay

## 2023-12-16 ENCOUNTER — Inpatient Hospital Stay: Admitting: Dietician

## 2023-12-16 ENCOUNTER — Inpatient Hospital Stay

## 2023-12-17 ENCOUNTER — Other Ambulatory Visit: Payer: Medicare Other

## 2023-12-17 ENCOUNTER — Ambulatory Visit: Payer: Medicare Other

## 2023-12-17 ENCOUNTER — Ambulatory Visit: Payer: Medicare Other | Admitting: Internal Medicine

## 2023-12-18 ENCOUNTER — Telehealth: Admitting: Internal Medicine

## 2024-01-01 DEATH — deceased

## 2024-01-08 ENCOUNTER — Other Ambulatory Visit

## 2024-01-08 ENCOUNTER — Encounter

## 2024-01-08 ENCOUNTER — Ambulatory Visit

## 2024-01-08 ENCOUNTER — Ambulatory Visit: Admitting: Physician Assistant

## 2024-01-29 ENCOUNTER — Ambulatory Visit: Admitting: Internal Medicine

## 2024-01-29 ENCOUNTER — Other Ambulatory Visit

## 2024-01-29 ENCOUNTER — Ambulatory Visit

## 2024-02-19 ENCOUNTER — Ambulatory Visit: Admitting: Physician Assistant

## 2024-02-19 ENCOUNTER — Encounter

## 2024-02-19 ENCOUNTER — Other Ambulatory Visit

## 2024-02-19 ENCOUNTER — Ambulatory Visit
